# Patient Record
Sex: Female | Born: 1935 | Race: Black or African American | Hispanic: No | Marital: Married | Smoking: Never smoker
Health system: Southern US, Community
[De-identification: ages and names within clinical notes are randomized; demographics above are authoritative.]

## PROBLEM LIST (undated history)

## (undated) DIAGNOSIS — N183 Chronic kidney disease, stage 3 unspecified: Secondary | ICD-10-CM

## (undated) DIAGNOSIS — M47812 Spondylosis without myelopathy or radiculopathy, cervical region: Secondary | ICD-10-CM

## (undated) DIAGNOSIS — Z8619 Personal history of other infectious and parasitic diseases: Secondary | ICD-10-CM

## (undated) DIAGNOSIS — I739 Peripheral vascular disease, unspecified: Secondary | ICD-10-CM

## (undated) DIAGNOSIS — I2510 Atherosclerotic heart disease of native coronary artery without angina pectoris: Secondary | ICD-10-CM

## (undated) DIAGNOSIS — K219 Gastro-esophageal reflux disease without esophagitis: Secondary | ICD-10-CM

## (undated) DIAGNOSIS — Z9119 Patient's noncompliance with other medical treatment and regimen: Secondary | ICD-10-CM

## (undated) DIAGNOSIS — I10 Essential (primary) hypertension: Secondary | ICD-10-CM

## (undated) DIAGNOSIS — J449 Chronic obstructive pulmonary disease, unspecified: Secondary | ICD-10-CM

## (undated) DIAGNOSIS — E669 Obesity, unspecified: Secondary | ICD-10-CM

## (undated) DIAGNOSIS — I429 Cardiomyopathy, unspecified: Secondary | ICD-10-CM

## (undated) DIAGNOSIS — IMO0001 Reserved for inherently not codable concepts without codable children: Secondary | ICD-10-CM

## (undated) DIAGNOSIS — F329 Major depressive disorder, single episode, unspecified: Secondary | ICD-10-CM

## (undated) DIAGNOSIS — F32A Depression, unspecified: Secondary | ICD-10-CM

## (undated) DIAGNOSIS — K279 Peptic ulcer, site unspecified, unspecified as acute or chronic, without hemorrhage or perforation: Secondary | ICD-10-CM

## (undated) DIAGNOSIS — Z91199 Patient's noncompliance with other medical treatment and regimen due to unspecified reason: Secondary | ICD-10-CM

## (undated) DIAGNOSIS — E11319 Type 2 diabetes mellitus with unspecified diabetic retinopathy without macular edema: Secondary | ICD-10-CM

## (undated) DIAGNOSIS — K222 Esophageal obstruction: Secondary | ICD-10-CM

## (undated) DIAGNOSIS — M1990 Unspecified osteoarthritis, unspecified site: Secondary | ICD-10-CM

## (undated) DIAGNOSIS — K5790 Diverticulosis of intestine, part unspecified, without perforation or abscess without bleeding: Secondary | ICD-10-CM

## (undated) DIAGNOSIS — L299 Pruritus, unspecified: Secondary | ICD-10-CM

## (undated) DIAGNOSIS — G4730 Sleep apnea, unspecified: Secondary | ICD-10-CM

## (undated) DIAGNOSIS — R001 Bradycardia, unspecified: Secondary | ICD-10-CM

## (undated) DIAGNOSIS — E785 Hyperlipidemia, unspecified: Secondary | ICD-10-CM

## (undated) DIAGNOSIS — K449 Diaphragmatic hernia without obstruction or gangrene: Secondary | ICD-10-CM

## (undated) DIAGNOSIS — C55 Malignant neoplasm of uterus, part unspecified: Secondary | ICD-10-CM

## (undated) DIAGNOSIS — I5042 Chronic combined systolic (congestive) and diastolic (congestive) heart failure: Secondary | ICD-10-CM

## (undated) DIAGNOSIS — B029 Zoster without complications: Secondary | ICD-10-CM

## (undated) HISTORY — DX: Diaphragmatic hernia without obstruction or gangrene: K44.9

## (undated) HISTORY — DX: Personal history of other infectious and parasitic diseases: Z86.19

## (undated) HISTORY — DX: Cardiomyopathy, unspecified: I42.9

## (undated) HISTORY — DX: Spondylosis without myelopathy or radiculopathy, cervical region: M47.812

## (undated) HISTORY — PX: HERNIA REPAIR: SHX51

## (undated) HISTORY — DX: Pruritus, unspecified: L29.9

## (undated) HISTORY — DX: Malignant neoplasm of uterus, part unspecified: C55

## (undated) HISTORY — DX: Depression, unspecified: F32.A

## (undated) HISTORY — DX: Peptic ulcer, site unspecified, unspecified as acute or chronic, without hemorrhage or perforation: K27.9

## (undated) HISTORY — DX: Patient's noncompliance with other medical treatment and regimen: Z91.19

## (undated) HISTORY — PX: SPINE SURGERY: SHX786

## (undated) HISTORY — PX: TUBAL LIGATION: SHX77

## (undated) HISTORY — PX: BRAIN SURGERY: SHX531

## (undated) HISTORY — DX: Peripheral vascular disease, unspecified: I73.9

## (undated) HISTORY — DX: Gastro-esophageal reflux disease without esophagitis: K21.9

## (undated) HISTORY — DX: Chronic obstructive pulmonary disease, unspecified: J44.9

## (undated) HISTORY — DX: Chronic kidney disease, stage 3 (moderate): N18.3

## (undated) HISTORY — PX: CARDIAC CATHETERIZATION: SHX172

## (undated) HISTORY — DX: Unspecified osteoarthritis, unspecified site: M19.90

## (undated) HISTORY — DX: Type 2 diabetes mellitus with unspecified diabetic retinopathy without macular edema: E11.319

## (undated) HISTORY — PX: EYE SURGERY: SHX253

## (undated) HISTORY — DX: Zoster without complications: B02.9

## (undated) HISTORY — DX: Chronic kidney disease, stage 3 unspecified: N18.30

## (undated) HISTORY — DX: Sleep apnea, unspecified: G47.30

## (undated) HISTORY — DX: Esophageal obstruction: K22.2

## (undated) HISTORY — DX: Bradycardia, unspecified: R00.1

## (undated) HISTORY — DX: Obesity, unspecified: E66.9

## (undated) HISTORY — DX: Chronic combined systolic (congestive) and diastolic (congestive) heart failure: I50.42

## (undated) HISTORY — DX: Atherosclerotic heart disease of native coronary artery without angina pectoris: I25.10

## (undated) HISTORY — DX: Major depressive disorder, single episode, unspecified: F32.9

## (undated) HISTORY — DX: Hyperlipidemia, unspecified: E78.5

## (undated) HISTORY — DX: Diverticulosis of intestine, part unspecified, without perforation or abscess without bleeding: K57.90

## (undated) HISTORY — DX: Patient's noncompliance with other medical treatment and regimen due to unspecified reason: Z91.199

---

## 1965-06-17 HISTORY — PX: TUBAL LIGATION: SHX77

## 1995-06-18 HISTORY — PX: CRANIOTOMY: SHX93

## 1995-10-03 ENCOUNTER — Encounter: Payer: Self-pay | Admitting: Gastroenterology

## 1995-11-13 ENCOUNTER — Encounter: Payer: Self-pay | Admitting: Gastroenterology

## 1997-10-17 ENCOUNTER — Other Ambulatory Visit: Admission: RE | Admit: 1997-10-17 | Discharge: 1997-10-17 | Payer: Self-pay | Admitting: Obstetrics & Gynecology

## 1997-10-30 ENCOUNTER — Emergency Department (HOSPITAL_COMMUNITY): Admission: EM | Admit: 1997-10-30 | Discharge: 1997-10-30 | Payer: Self-pay | Admitting: Emergency Medicine

## 1997-11-07 ENCOUNTER — Ambulatory Visit (HOSPITAL_COMMUNITY): Admission: RE | Admit: 1997-11-07 | Discharge: 1997-11-07 | Payer: Self-pay | Admitting: Neurosurgery

## 1998-04-14 ENCOUNTER — Encounter: Payer: Self-pay | Admitting: Gastroenterology

## 1998-06-14 ENCOUNTER — Encounter: Payer: Self-pay | Admitting: Endocrinology

## 1998-06-14 ENCOUNTER — Ambulatory Visit (HOSPITAL_COMMUNITY): Admission: RE | Admit: 1998-06-14 | Discharge: 1998-06-14 | Payer: Self-pay | Admitting: Endocrinology

## 1998-06-27 ENCOUNTER — Ambulatory Visit (HOSPITAL_COMMUNITY): Admission: RE | Admit: 1998-06-27 | Discharge: 1998-06-27 | Payer: Self-pay | Admitting: Obstetrics & Gynecology

## 1998-06-27 ENCOUNTER — Encounter: Payer: Self-pay | Admitting: Obstetrics & Gynecology

## 1998-07-28 ENCOUNTER — Encounter: Payer: Self-pay | Admitting: Endocrinology

## 1998-07-28 ENCOUNTER — Ambulatory Visit (HOSPITAL_COMMUNITY): Admission: RE | Admit: 1998-07-28 | Discharge: 1998-07-28 | Payer: Self-pay | Admitting: Endocrinology

## 1998-10-16 HISTORY — PX: KNEE ARTHROSCOPY: SUR90

## 1998-11-01 ENCOUNTER — Ambulatory Visit (HOSPITAL_BASED_OUTPATIENT_CLINIC_OR_DEPARTMENT_OTHER): Admission: RE | Admit: 1998-11-01 | Discharge: 1998-11-01 | Payer: Self-pay | Admitting: Orthopedic Surgery

## 1998-12-08 ENCOUNTER — Encounter: Admission: RE | Admit: 1998-12-08 | Discharge: 1998-12-27 | Payer: Self-pay | Admitting: Orthopedic Surgery

## 1999-01-18 ENCOUNTER — Other Ambulatory Visit: Admission: RE | Admit: 1999-01-18 | Discharge: 1999-01-18 | Payer: Self-pay | Admitting: Obstetrics & Gynecology

## 1999-03-09 ENCOUNTER — Ambulatory Visit (HOSPITAL_COMMUNITY): Admission: RE | Admit: 1999-03-09 | Discharge: 1999-03-09 | Payer: Self-pay | Admitting: Orthopedic Surgery

## 1999-04-30 ENCOUNTER — Inpatient Hospital Stay (HOSPITAL_COMMUNITY): Admission: EM | Admit: 1999-04-30 | Discharge: 1999-05-02 | Payer: Self-pay | Admitting: Emergency Medicine

## 1999-04-30 ENCOUNTER — Encounter: Payer: Self-pay | Admitting: Emergency Medicine

## 1999-05-01 ENCOUNTER — Encounter: Payer: Self-pay | Admitting: Cardiology

## 1999-07-05 ENCOUNTER — Ambulatory Visit (HOSPITAL_COMMUNITY): Admission: RE | Admit: 1999-07-05 | Discharge: 1999-07-05 | Payer: Self-pay | Admitting: Orthopedic Surgery

## 1999-07-05 ENCOUNTER — Encounter: Payer: Self-pay | Admitting: Orthopedic Surgery

## 2000-02-15 ENCOUNTER — Encounter: Payer: Self-pay | Admitting: Internal Medicine

## 2000-02-15 ENCOUNTER — Emergency Department (HOSPITAL_COMMUNITY): Admission: EM | Admit: 2000-02-15 | Discharge: 2000-02-15 | Payer: Self-pay | Admitting: Emergency Medicine

## 2000-02-15 ENCOUNTER — Encounter: Payer: Self-pay | Admitting: Emergency Medicine

## 2000-05-12 ENCOUNTER — Encounter: Payer: Self-pay | Admitting: *Deleted

## 2000-05-12 ENCOUNTER — Inpatient Hospital Stay (HOSPITAL_COMMUNITY): Admission: EM | Admit: 2000-05-12 | Discharge: 2000-05-14 | Payer: Self-pay | Admitting: *Deleted

## 2000-05-13 ENCOUNTER — Encounter: Payer: Self-pay | Admitting: Cardiovascular Disease

## 2000-05-13 ENCOUNTER — Encounter: Payer: Self-pay | Admitting: Endocrinology

## 2000-06-06 ENCOUNTER — Encounter: Payer: Self-pay | Admitting: Emergency Medicine

## 2000-06-06 ENCOUNTER — Inpatient Hospital Stay (HOSPITAL_COMMUNITY): Admission: EM | Admit: 2000-06-06 | Discharge: 2000-06-10 | Payer: Self-pay | Admitting: *Deleted

## 2000-06-08 ENCOUNTER — Encounter: Payer: Self-pay | Admitting: Cardiology

## 2000-06-28 ENCOUNTER — Ambulatory Visit (HOSPITAL_BASED_OUTPATIENT_CLINIC_OR_DEPARTMENT_OTHER): Admission: RE | Admit: 2000-06-28 | Discharge: 2000-06-28 | Payer: Self-pay | Admitting: Pulmonary Disease

## 2000-06-28 ENCOUNTER — Encounter: Payer: Self-pay | Admitting: Pulmonary Disease

## 2000-07-08 ENCOUNTER — Encounter: Payer: Self-pay | Admitting: Obstetrics & Gynecology

## 2000-07-08 ENCOUNTER — Ambulatory Visit (HOSPITAL_COMMUNITY): Admission: RE | Admit: 2000-07-08 | Discharge: 2000-07-08 | Payer: Self-pay | Admitting: Obstetrics & Gynecology

## 2000-07-30 ENCOUNTER — Encounter: Payer: Self-pay | Admitting: Internal Medicine

## 2000-07-30 ENCOUNTER — Ambulatory Visit (HOSPITAL_COMMUNITY): Admission: RE | Admit: 2000-07-30 | Discharge: 2000-07-30 | Payer: Self-pay | Admitting: Internal Medicine

## 2000-09-18 ENCOUNTER — Ambulatory Visit (HOSPITAL_BASED_OUTPATIENT_CLINIC_OR_DEPARTMENT_OTHER): Admission: RE | Admit: 2000-09-18 | Discharge: 2000-09-18 | Payer: Self-pay | Admitting: Pulmonary Disease

## 2000-10-07 ENCOUNTER — Encounter: Admission: RE | Admit: 2000-10-07 | Discharge: 2000-10-07 | Payer: Self-pay | Admitting: Neurosurgery

## 2000-10-07 ENCOUNTER — Encounter: Payer: Self-pay | Admitting: Neurosurgery

## 2001-07-17 ENCOUNTER — Encounter: Payer: Self-pay | Admitting: Obstetrics & Gynecology

## 2001-07-17 ENCOUNTER — Ambulatory Visit (HOSPITAL_COMMUNITY): Admission: RE | Admit: 2001-07-17 | Discharge: 2001-07-17 | Payer: Self-pay | Admitting: Obstetrics & Gynecology

## 2001-09-18 ENCOUNTER — Encounter: Admission: RE | Admit: 2001-09-18 | Discharge: 2001-10-07 | Payer: Self-pay | Admitting: Orthopedic Surgery

## 2001-12-22 ENCOUNTER — Other Ambulatory Visit: Admission: RE | Admit: 2001-12-22 | Discharge: 2001-12-22 | Payer: Self-pay | Admitting: Obstetrics & Gynecology

## 2002-07-20 ENCOUNTER — Inpatient Hospital Stay (HOSPITAL_COMMUNITY): Admission: EM | Admit: 2002-07-20 | Discharge: 2002-07-24 | Payer: Self-pay | Admitting: Emergency Medicine

## 2002-07-20 ENCOUNTER — Encounter: Payer: Self-pay | Admitting: Internal Medicine

## 2002-07-21 ENCOUNTER — Encounter: Payer: Self-pay | Admitting: Family Medicine

## 2002-07-21 ENCOUNTER — Encounter: Payer: Self-pay | Admitting: Orthopedic Surgery

## 2003-01-18 ENCOUNTER — Encounter: Payer: Self-pay | Admitting: Family Medicine

## 2003-01-18 ENCOUNTER — Ambulatory Visit (HOSPITAL_COMMUNITY): Admission: RE | Admit: 2003-01-18 | Discharge: 2003-01-18 | Payer: Self-pay | Admitting: Family Medicine

## 2003-06-18 HISTORY — PX: CATARACT EXTRACTION, BILATERAL: SHX1313

## 2003-09-02 ENCOUNTER — Encounter: Admission: RE | Admit: 2003-09-02 | Discharge: 2003-10-19 | Payer: Self-pay | Admitting: Orthopedic Surgery

## 2003-11-17 ENCOUNTER — Ambulatory Visit (HOSPITAL_COMMUNITY): Admission: RE | Admit: 2003-11-17 | Discharge: 2003-11-17 | Payer: Self-pay | Admitting: Endocrinology

## 2003-12-16 HISTORY — PX: OTHER SURGICAL HISTORY: SHX169

## 2004-01-30 ENCOUNTER — Ambulatory Visit (HOSPITAL_COMMUNITY): Admission: RE | Admit: 2004-01-30 | Discharge: 2004-01-30 | Payer: Self-pay | Admitting: Endocrinology

## 2004-03-19 ENCOUNTER — Encounter: Payer: Self-pay | Admitting: Gastroenterology

## 2004-04-04 ENCOUNTER — Encounter: Payer: Self-pay | Admitting: Gastroenterology

## 2004-04-04 HISTORY — PX: ESOPHAGOGASTRODUODENOSCOPY: SHX1529

## 2004-07-10 ENCOUNTER — Ambulatory Visit: Payer: Self-pay | Admitting: Internal Medicine

## 2004-07-12 ENCOUNTER — Ambulatory Visit (HOSPITAL_COMMUNITY): Admission: RE | Admit: 2004-07-12 | Discharge: 2004-07-12 | Payer: Self-pay | Admitting: Internal Medicine

## 2004-07-16 ENCOUNTER — Ambulatory Visit: Payer: Self-pay | Admitting: Endocrinology

## 2004-10-08 ENCOUNTER — Ambulatory Visit: Payer: Self-pay | Admitting: Endocrinology

## 2004-10-31 ENCOUNTER — Ambulatory Visit: Payer: Self-pay | Admitting: Endocrinology

## 2004-11-08 ENCOUNTER — Ambulatory Visit: Payer: Self-pay | Admitting: Endocrinology

## 2004-11-21 ENCOUNTER — Ambulatory Visit: Payer: Self-pay | Admitting: Endocrinology

## 2004-11-28 ENCOUNTER — Ambulatory Visit: Payer: Self-pay | Admitting: Endocrinology

## 2004-12-11 ENCOUNTER — Ambulatory Visit: Payer: Self-pay

## 2004-12-12 ENCOUNTER — Ambulatory Visit: Payer: Self-pay

## 2004-12-12 ENCOUNTER — Other Ambulatory Visit: Admission: RE | Admit: 2004-12-12 | Discharge: 2004-12-12 | Payer: Self-pay | Admitting: Obstetrics & Gynecology

## 2004-12-25 ENCOUNTER — Ambulatory Visit: Payer: Self-pay | Admitting: Endocrinology

## 2005-01-02 ENCOUNTER — Ambulatory Visit: Payer: Self-pay

## 2005-01-28 ENCOUNTER — Ambulatory Visit: Payer: Self-pay | Admitting: Endocrinology

## 2005-01-29 ENCOUNTER — Ambulatory Visit: Payer: Self-pay | Admitting: Pulmonary Disease

## 2005-02-28 ENCOUNTER — Ambulatory Visit: Payer: Self-pay | Admitting: Endocrinology

## 2005-04-01 ENCOUNTER — Ambulatory Visit: Payer: Self-pay

## 2005-04-09 ENCOUNTER — Ambulatory Visit (HOSPITAL_COMMUNITY): Admission: RE | Admit: 2005-04-09 | Discharge: 2005-04-09 | Payer: Self-pay | Admitting: Endocrinology

## 2005-06-21 ENCOUNTER — Ambulatory Visit: Payer: Self-pay

## 2005-06-21 ENCOUNTER — Ambulatory Visit: Payer: Self-pay | Admitting: Endocrinology

## 2005-07-01 ENCOUNTER — Ambulatory Visit: Payer: Self-pay | Admitting: Endocrinology

## 2005-07-05 ENCOUNTER — Ambulatory Visit: Payer: Self-pay | Admitting: Endocrinology

## 2005-07-06 ENCOUNTER — Ambulatory Visit (HOSPITAL_COMMUNITY): Admission: RE | Admit: 2005-07-06 | Discharge: 2005-07-06 | Payer: Self-pay | Admitting: Gastroenterology

## 2005-11-20 ENCOUNTER — Ambulatory Visit: Payer: Self-pay | Admitting: Endocrinology

## 2005-12-03 ENCOUNTER — Ambulatory Visit: Payer: Self-pay | Admitting: Endocrinology

## 2005-12-07 ENCOUNTER — Ambulatory Visit: Payer: Self-pay | Admitting: Cardiology

## 2005-12-07 ENCOUNTER — Inpatient Hospital Stay (HOSPITAL_COMMUNITY): Admission: EM | Admit: 2005-12-07 | Discharge: 2005-12-10 | Payer: Self-pay | Admitting: Emergency Medicine

## 2005-12-09 ENCOUNTER — Ambulatory Visit: Payer: Self-pay | Admitting: Internal Medicine

## 2005-12-09 ENCOUNTER — Encounter (INDEPENDENT_AMBULATORY_CARE_PROVIDER_SITE_OTHER): Payer: Self-pay | Admitting: *Deleted

## 2005-12-26 ENCOUNTER — Ambulatory Visit: Payer: Self-pay | Admitting: Endocrinology

## 2005-12-30 ENCOUNTER — Ambulatory Visit: Payer: Self-pay | Admitting: Endocrinology

## 2006-02-11 ENCOUNTER — Ambulatory Visit: Payer: Self-pay | Admitting: Cardiology

## 2006-03-27 ENCOUNTER — Ambulatory Visit: Payer: Self-pay | Admitting: Endocrinology

## 2006-04-17 ENCOUNTER — Ambulatory Visit: Payer: Self-pay | Admitting: Endocrinology

## 2006-04-22 ENCOUNTER — Ambulatory Visit: Payer: Self-pay | Admitting: Endocrinology

## 2006-04-24 ENCOUNTER — Ambulatory Visit: Payer: Self-pay | Admitting: Endocrinology

## 2006-05-06 ENCOUNTER — Ambulatory Visit (HOSPITAL_COMMUNITY): Admission: RE | Admit: 2006-05-06 | Discharge: 2006-05-06 | Payer: Self-pay | Admitting: Obstetrics & Gynecology

## 2006-05-19 ENCOUNTER — Ambulatory Visit: Payer: Self-pay | Admitting: Endocrinology

## 2006-05-23 ENCOUNTER — Ambulatory Visit: Payer: Self-pay | Admitting: Cardiology

## 2006-06-11 ENCOUNTER — Inpatient Hospital Stay (HOSPITAL_COMMUNITY): Admission: AD | Admit: 2006-06-11 | Discharge: 2006-06-17 | Payer: Self-pay | Admitting: Endocrinology

## 2006-06-11 ENCOUNTER — Ambulatory Visit: Payer: Self-pay | Admitting: Endocrinology

## 2006-06-11 ENCOUNTER — Ambulatory Visit: Payer: Self-pay | Admitting: Cardiology

## 2006-06-12 ENCOUNTER — Ambulatory Visit: Payer: Self-pay | Admitting: Internal Medicine

## 2006-06-23 ENCOUNTER — Ambulatory Visit: Payer: Self-pay | Admitting: Cardiology

## 2006-06-24 ENCOUNTER — Ambulatory Visit: Payer: Self-pay | Admitting: Endocrinology

## 2006-07-21 ENCOUNTER — Ambulatory Visit: Payer: Self-pay | Admitting: Cardiology

## 2006-07-29 ENCOUNTER — Ambulatory Visit: Payer: Self-pay | Admitting: Endocrinology

## 2006-08-04 ENCOUNTER — Ambulatory Visit: Payer: Self-pay | Admitting: Cardiology

## 2006-08-04 LAB — CONVERTED CEMR LAB
CO2: 28 meq/L (ref 19–32)
GFR calc Af Amer: 51 mL/min
GFR calc non Af Amer: 43 mL/min
Glucose, Bld: 141 mg/dL — ABNORMAL HIGH (ref 70–99)
Sodium: 142 meq/L (ref 135–145)

## 2006-08-19 ENCOUNTER — Ambulatory Visit: Payer: Self-pay | Admitting: Cardiology

## 2006-09-01 ENCOUNTER — Ambulatory Visit: Payer: Self-pay | Admitting: *Deleted

## 2006-09-03 ENCOUNTER — Inpatient Hospital Stay (HOSPITAL_COMMUNITY): Admission: EM | Admit: 2006-09-03 | Discharge: 2006-09-04 | Payer: Self-pay | Admitting: Emergency Medicine

## 2006-09-18 ENCOUNTER — Ambulatory Visit: Payer: Self-pay | Admitting: Endocrinology

## 2006-09-19 ENCOUNTER — Ambulatory Visit: Payer: Self-pay | Admitting: Cardiology

## 2006-09-19 ENCOUNTER — Ambulatory Visit: Payer: Self-pay | Admitting: Internal Medicine

## 2006-09-19 LAB — CONVERTED CEMR LAB
CO2: 29 meq/L (ref 19–32)
Chloride: 104 meq/L (ref 96–112)
GFR calc Af Amer: 38 mL/min
GFR calc non Af Amer: 31 mL/min
Glucose, Bld: 250 mg/dL — ABNORMAL HIGH (ref 70–99)
Sodium: 141 meq/L (ref 135–145)

## 2006-09-22 ENCOUNTER — Encounter: Admission: RE | Admit: 2006-09-22 | Discharge: 2006-12-21 | Payer: Self-pay | Admitting: Cardiology

## 2006-10-03 ENCOUNTER — Ambulatory Visit: Payer: Self-pay | Admitting: Endocrinology

## 2006-11-29 ENCOUNTER — Encounter: Admission: RE | Admit: 2006-11-29 | Discharge: 2006-11-29 | Payer: Self-pay | Admitting: Neurosurgery

## 2006-12-08 ENCOUNTER — Ambulatory Visit: Payer: Self-pay | Admitting: Endocrinology

## 2006-12-08 LAB — CONVERTED CEMR LAB
BUN: 19 mg/dL (ref 6–23)
Basophils Relative: 0.6 % (ref 0.0–1.0)
CO2: 31 meq/L (ref 19–32)
Calcium: 9.4 mg/dL (ref 8.4–10.5)
Chloride: 112 meq/L (ref 96–112)
Creatinine, Ser: 1.2 mg/dL (ref 0.4–1.2)
Crystals: NEGATIVE
Eosinophils Absolute: 0.1 10*3/uL (ref 0.0–0.6)
Eosinophils Relative: 1.7 % (ref 0.0–5.0)
GFR calc Af Amer: 56 mL/min
Glucose, Bld: 151 mg/dL — ABNORMAL HIGH (ref 70–99)
HCT: 41.5 % (ref 36.0–46.0)
Hgb A1c MFr Bld: 10.5 % — ABNORMAL HIGH (ref 4.6–6.0)
Ketones, ur: NEGATIVE mg/dL
MCV: 86.9 fL (ref 78.0–100.0)
Microalb Creat Ratio: 197.7 mg/g — ABNORMAL HIGH (ref 0.0–30.0)
Microalb, Ur: 31 mg/dL (ref 0.0–1.9)
Mucus, UA: NEGATIVE
Neutrophils Relative %: 63.9 % (ref 43.0–77.0)
Platelets: 276 10*3/uL (ref 150–400)
RBC: 4.77 M/uL (ref 3.87–5.11)
Total CHOL/HDL Ratio: 2.5
Total Protein, Urine: 100 mg/dL — AB
Triglycerides: 74 mg/dL (ref 0–149)
VLDL: 15 mg/dL (ref 0–40)
WBC: 6.9 10*3/uL (ref 4.5–10.5)

## 2006-12-23 ENCOUNTER — Ambulatory Visit: Payer: Self-pay | Admitting: Cardiology

## 2006-12-23 ENCOUNTER — Inpatient Hospital Stay (HOSPITAL_COMMUNITY): Admission: EM | Admit: 2006-12-23 | Discharge: 2006-12-24 | Payer: Self-pay | Admitting: Emergency Medicine

## 2007-01-02 ENCOUNTER — Ambulatory Visit: Payer: Self-pay | Admitting: Cardiology

## 2007-01-22 ENCOUNTER — Encounter: Payer: Self-pay | Admitting: Endocrinology

## 2007-01-22 DIAGNOSIS — I2583 Coronary atherosclerosis due to lipid rich plaque: Secondary | ICD-10-CM

## 2007-01-22 DIAGNOSIS — K219 Gastro-esophageal reflux disease without esophagitis: Secondary | ICD-10-CM | POA: Insufficient documentation

## 2007-01-22 DIAGNOSIS — I2510 Atherosclerotic heart disease of native coronary artery without angina pectoris: Secondary | ICD-10-CM | POA: Insufficient documentation

## 2007-01-22 DIAGNOSIS — I739 Peripheral vascular disease, unspecified: Secondary | ICD-10-CM | POA: Insufficient documentation

## 2007-01-22 DIAGNOSIS — I10 Essential (primary) hypertension: Secondary | ICD-10-CM | POA: Insufficient documentation

## 2007-01-22 DIAGNOSIS — M1990 Unspecified osteoarthritis, unspecified site: Secondary | ICD-10-CM | POA: Insufficient documentation

## 2007-01-23 ENCOUNTER — Ambulatory Visit: Payer: Self-pay | Admitting: Endocrinology

## 2007-03-24 ENCOUNTER — Ambulatory Visit: Payer: Self-pay | Admitting: Endocrinology

## 2007-03-24 LAB — CONVERTED CEMR LAB
ALT: 30 units/L (ref 0–35)
AST: 22 units/L (ref 0–37)
Alkaline Phosphatase: 73 units/L (ref 39–117)
Cholesterol: 185 mg/dL (ref 0–200)
LDL Cholesterol: 91 mg/dL (ref 0–99)
Total Protein: 6.3 g/dL (ref 6.0–8.3)
VLDL: 26 mg/dL (ref 0–40)

## 2007-03-27 ENCOUNTER — Ambulatory Visit: Payer: Self-pay | Admitting: Cardiology

## 2007-05-28 ENCOUNTER — Ambulatory Visit (HOSPITAL_COMMUNITY): Admission: RE | Admit: 2007-05-28 | Discharge: 2007-05-28 | Payer: Self-pay | Admitting: Endocrinology

## 2007-05-31 ENCOUNTER — Encounter: Payer: Self-pay | Admitting: Endocrinology

## 2007-06-16 ENCOUNTER — Telehealth: Payer: Self-pay | Admitting: Endocrinology

## 2007-07-16 ENCOUNTER — Ambulatory Visit: Payer: Self-pay | Admitting: Internal Medicine

## 2007-07-16 DIAGNOSIS — J449 Chronic obstructive pulmonary disease, unspecified: Secondary | ICD-10-CM | POA: Insufficient documentation

## 2007-07-16 DIAGNOSIS — J189 Pneumonia, unspecified organism: Secondary | ICD-10-CM | POA: Insufficient documentation

## 2007-07-16 DIAGNOSIS — F329 Major depressive disorder, single episode, unspecified: Secondary | ICD-10-CM | POA: Insufficient documentation

## 2007-07-16 DIAGNOSIS — E785 Hyperlipidemia, unspecified: Secondary | ICD-10-CM | POA: Insufficient documentation

## 2007-07-16 DIAGNOSIS — G609 Hereditary and idiopathic neuropathy, unspecified: Secondary | ICD-10-CM | POA: Insufficient documentation

## 2007-07-16 DIAGNOSIS — K279 Peptic ulcer, site unspecified, unspecified as acute or chronic, without hemorrhage or perforation: Secondary | ICD-10-CM | POA: Insufficient documentation

## 2007-07-16 DIAGNOSIS — J45909 Unspecified asthma, uncomplicated: Secondary | ICD-10-CM | POA: Insufficient documentation

## 2007-07-16 DIAGNOSIS — J4489 Other specified chronic obstructive pulmonary disease: Secondary | ICD-10-CM | POA: Insufficient documentation

## 2007-09-03 ENCOUNTER — Ambulatory Visit: Payer: Self-pay | Admitting: Endocrinology

## 2007-09-03 DIAGNOSIS — R0602 Shortness of breath: Secondary | ICD-10-CM | POA: Insufficient documentation

## 2007-09-03 LAB — CONVERTED CEMR LAB
Glucose, Urine, Semiquant: NEGATIVE
Hgb A1c MFr Bld: 10.7 % — ABNORMAL HIGH (ref 4.6–6.0)
Ketones, urine, test strip: NEGATIVE
Nitrite: NEGATIVE
Protein, U semiquant: NEGATIVE

## 2007-09-04 ENCOUNTER — Ambulatory Visit (HOSPITAL_COMMUNITY): Admission: RE | Admit: 2007-09-04 | Discharge: 2007-09-04 | Payer: Self-pay | Admitting: Endocrinology

## 2007-09-04 ENCOUNTER — Telehealth (INDEPENDENT_AMBULATORY_CARE_PROVIDER_SITE_OTHER): Payer: Self-pay | Admitting: *Deleted

## 2007-09-17 ENCOUNTER — Ambulatory Visit: Payer: Self-pay | Admitting: Cardiology

## 2007-09-18 ENCOUNTER — Ambulatory Visit: Payer: Self-pay | Admitting: Endocrinology

## 2007-10-05 ENCOUNTER — Encounter: Payer: Self-pay | Admitting: Endocrinology

## 2007-12-04 ENCOUNTER — Ambulatory Visit: Payer: Self-pay | Admitting: Endocrinology

## 2007-12-04 LAB — CONVERTED CEMR LAB
CO2: 30 meq/L (ref 19–32)
Chloride: 102 meq/L (ref 96–112)
Hgb A1c MFr Bld: 10.1 % — ABNORMAL HIGH (ref 4.6–6.0)
Pro B Natriuretic peptide (BNP): 221 pg/mL — ABNORMAL HIGH (ref 0.0–100.0)
Sodium: 141 meq/L (ref 135–145)

## 2007-12-21 ENCOUNTER — Ambulatory Visit: Payer: Self-pay | Admitting: Endocrinology

## 2007-12-21 DIAGNOSIS — R609 Edema, unspecified: Secondary | ICD-10-CM | POA: Insufficient documentation

## 2007-12-21 LAB — CONVERTED CEMR LAB
Chloride: 104 meq/L (ref 96–112)
GFR calc non Af Amer: 42 mL/min
Potassium: 3.7 meq/L (ref 3.5–5.1)
Pro B Natriuretic peptide (BNP): 68 pg/mL (ref 0.0–100.0)
Sodium: 141 meq/L (ref 135–145)

## 2008-01-21 ENCOUNTER — Ambulatory Visit: Payer: Self-pay | Admitting: Endocrinology

## 2008-02-09 ENCOUNTER — Ambulatory Visit: Payer: Self-pay | Admitting: Endocrinology

## 2008-02-09 ENCOUNTER — Ambulatory Visit: Payer: Self-pay

## 2008-02-16 ENCOUNTER — Ambulatory Visit: Payer: Self-pay | Admitting: Endocrinology

## 2008-02-16 DIAGNOSIS — M79609 Pain in unspecified limb: Secondary | ICD-10-CM | POA: Insufficient documentation

## 2008-03-02 ENCOUNTER — Telehealth (INDEPENDENT_AMBULATORY_CARE_PROVIDER_SITE_OTHER): Payer: Self-pay | Admitting: *Deleted

## 2008-03-21 ENCOUNTER — Ambulatory Visit: Payer: Self-pay | Admitting: Cardiology

## 2008-03-24 ENCOUNTER — Ambulatory Visit: Payer: Self-pay | Admitting: Endocrinology

## 2008-03-24 LAB — CONVERTED CEMR LAB
Chloride: 103 meq/L (ref 96–112)
Creatinine, Ser: 1.4 mg/dL — ABNORMAL HIGH (ref 0.4–1.2)
GFR calc non Af Amer: 39 mL/min
Hgb A1c MFr Bld: 9.3 % — ABNORMAL HIGH (ref 4.6–6.0)
Potassium: 4 meq/L (ref 3.5–5.1)
Sodium: 142 meq/L (ref 135–145)

## 2008-04-29 ENCOUNTER — Encounter: Payer: Self-pay | Admitting: Endocrinology

## 2008-05-03 ENCOUNTER — Encounter: Payer: Self-pay | Admitting: Endocrinology

## 2008-05-26 ENCOUNTER — Telehealth: Payer: Self-pay | Admitting: Endocrinology

## 2008-05-26 ENCOUNTER — Ambulatory Visit: Payer: Self-pay | Admitting: Internal Medicine

## 2008-05-26 ENCOUNTER — Ambulatory Visit: Payer: Self-pay | Admitting: Endocrinology

## 2008-05-31 ENCOUNTER — Ambulatory Visit (HOSPITAL_COMMUNITY): Admission: RE | Admit: 2008-05-31 | Discharge: 2008-05-31 | Payer: Self-pay | Admitting: Endocrinology

## 2008-06-17 DIAGNOSIS — M810 Age-related osteoporosis without current pathological fracture: Secondary | ICD-10-CM | POA: Insufficient documentation

## 2008-06-17 HISTORY — PX: CHOLECYSTECTOMY: SHX55

## 2008-06-23 ENCOUNTER — Ambulatory Visit: Payer: Self-pay | Admitting: Endocrinology

## 2008-06-29 ENCOUNTER — Ambulatory Visit: Payer: Self-pay | Admitting: Internal Medicine

## 2008-06-29 ENCOUNTER — Ambulatory Visit: Payer: Self-pay | Admitting: Cardiovascular Disease

## 2008-06-29 ENCOUNTER — Inpatient Hospital Stay (HOSPITAL_COMMUNITY): Admission: AD | Admit: 2008-06-29 | Discharge: 2008-07-07 | Payer: Self-pay | Admitting: Internal Medicine

## 2008-06-29 DIAGNOSIS — R1011 Right upper quadrant pain: Secondary | ICD-10-CM | POA: Insufficient documentation

## 2008-06-29 DIAGNOSIS — R509 Fever, unspecified: Secondary | ICD-10-CM | POA: Insufficient documentation

## 2008-07-01 ENCOUNTER — Encounter: Payer: Self-pay | Admitting: Cardiovascular Disease

## 2008-07-04 ENCOUNTER — Other Ambulatory Visit: Payer: Self-pay | Admitting: Internal Medicine

## 2008-07-11 ENCOUNTER — Ambulatory Visit: Payer: Self-pay | Admitting: Endocrinology

## 2008-07-11 LAB — CONVERTED CEMR LAB
Chloride: 105 meq/L (ref 96–112)
Creatinine, Ser: 1.7 mg/dL — ABNORMAL HIGH (ref 0.4–1.2)
GFR calc non Af Amer: 31 mL/min
Potassium: 4.3 meq/L (ref 3.5–5.1)

## 2008-07-19 ENCOUNTER — Ambulatory Visit: Payer: Self-pay | Admitting: Endocrinology

## 2008-07-19 DIAGNOSIS — N186 End stage renal disease: Secondary | ICD-10-CM | POA: Insufficient documentation

## 2008-07-19 DIAGNOSIS — Z992 Dependence on renal dialysis: Secondary | ICD-10-CM

## 2008-07-28 ENCOUNTER — Ambulatory Visit: Payer: Self-pay | Admitting: Endocrinology

## 2008-07-28 LAB — CONVERTED CEMR LAB
ALT: 19 units/L (ref 0–35)
Alkaline Phosphatase: 69 units/L (ref 39–117)
Bilirubin, Direct: 0.1 mg/dL (ref 0.0–0.3)
CO2: 27 meq/L (ref 19–32)
Calcium, Total (PTH): 9.5 mg/dL (ref 8.4–10.5)
Eosinophils Relative: 2.7 % (ref 0.0–5.0)
Glucose, Bld: 301 mg/dL — ABNORMAL HIGH (ref 70–99)
HCT: 41.5 % (ref 36.0–46.0)
Microalb Creat Ratio: 88.1 mg/g — ABNORMAL HIGH (ref 0.0–30.0)
Monocytes Absolute: 0.5 10*3/uL (ref 0.1–1.0)
Monocytes Relative: 8.4 % (ref 3.0–12.0)
PTH: 96 pg/mL — ABNORMAL HIGH (ref 14.0–72.0)
Platelets: 236 10*3/uL (ref 150–400)
Potassium: 4.2 meq/L (ref 3.5–5.1)
Sodium: 138 meq/L (ref 135–145)
Specific Gravity, Urine: >=1.03 (ref 1.000–1.03)
TSH: 1.87 microintl units/mL (ref 0.35–5.50)
Total Protein, Urine: 100 mg/dL — AB
Total Protein: 6.4 g/dL (ref 6.0–8.3)
Urine Glucose: 500 mg/dL — AB
WBC: 5.6 10*3/uL (ref 4.5–10.5)

## 2008-08-25 ENCOUNTER — Encounter: Payer: Self-pay | Admitting: Endocrinology

## 2008-09-01 ENCOUNTER — Encounter: Payer: Self-pay | Admitting: Endocrinology

## 2008-09-01 ENCOUNTER — Ambulatory Visit: Payer: Self-pay | Admitting: Endocrinology

## 2008-09-01 ENCOUNTER — Ambulatory Visit (HOSPITAL_COMMUNITY): Admission: RE | Admit: 2008-09-01 | Discharge: 2008-09-01 | Payer: Self-pay | Admitting: Surgery

## 2008-09-01 DIAGNOSIS — Z9119 Patient's noncompliance with other medical treatment and regimen: Secondary | ICD-10-CM | POA: Insufficient documentation

## 2008-09-01 DIAGNOSIS — Z91199 Patient's noncompliance with other medical treatment and regimen due to unspecified reason: Secondary | ICD-10-CM | POA: Insufficient documentation

## 2008-09-08 ENCOUNTER — Ambulatory Visit: Payer: Self-pay | Admitting: Endocrinology

## 2008-09-19 ENCOUNTER — Encounter: Payer: Self-pay | Admitting: Internal Medicine

## 2008-09-19 ENCOUNTER — Encounter: Payer: Self-pay | Admitting: Cardiology

## 2008-09-19 ENCOUNTER — Encounter (INDEPENDENT_AMBULATORY_CARE_PROVIDER_SITE_OTHER): Payer: Self-pay | Admitting: Surgery

## 2008-09-20 ENCOUNTER — Inpatient Hospital Stay (HOSPITAL_COMMUNITY): Admission: EM | Admit: 2008-09-20 | Discharge: 2008-09-21 | Payer: Self-pay | Admitting: *Deleted

## 2008-09-20 ENCOUNTER — Ambulatory Visit: Payer: Self-pay | Admitting: Internal Medicine

## 2008-09-21 ENCOUNTER — Telehealth (INDEPENDENT_AMBULATORY_CARE_PROVIDER_SITE_OTHER): Payer: Self-pay | Admitting: *Deleted

## 2008-09-22 ENCOUNTER — Ambulatory Visit: Payer: Self-pay | Admitting: Endocrinology

## 2008-09-22 ENCOUNTER — Telehealth (INDEPENDENT_AMBULATORY_CARE_PROVIDER_SITE_OTHER): Payer: Self-pay | Admitting: *Deleted

## 2008-09-27 ENCOUNTER — Ambulatory Visit: Payer: Self-pay | Admitting: Endocrinology

## 2008-10-12 ENCOUNTER — Ambulatory Visit: Payer: Self-pay | Admitting: Pulmonary Disease

## 2008-10-12 DIAGNOSIS — G4733 Obstructive sleep apnea (adult) (pediatric): Secondary | ICD-10-CM | POA: Insufficient documentation

## 2008-10-13 ENCOUNTER — Ambulatory Visit: Payer: Self-pay | Admitting: Cardiology

## 2008-10-14 ENCOUNTER — Encounter: Payer: Self-pay | Admitting: Cardiology

## 2008-10-27 ENCOUNTER — Telehealth: Payer: Self-pay | Admitting: Endocrinology

## 2008-11-07 ENCOUNTER — Encounter: Payer: Self-pay | Admitting: Endocrinology

## 2008-11-14 ENCOUNTER — Encounter: Payer: Self-pay | Admitting: Cardiology

## 2008-11-14 ENCOUNTER — Ambulatory Visit: Payer: Self-pay | Admitting: *Deleted

## 2008-11-15 ENCOUNTER — Ambulatory Visit: Payer: Self-pay | Admitting: Vascular Surgery

## 2008-11-15 ENCOUNTER — Encounter: Payer: Self-pay | Admitting: Cardiology

## 2008-11-15 ENCOUNTER — Inpatient Hospital Stay (HOSPITAL_COMMUNITY): Admission: EM | Admit: 2008-11-15 | Discharge: 2008-11-16 | Payer: Self-pay | Admitting: Emergency Medicine

## 2008-11-16 ENCOUNTER — Encounter (INDEPENDENT_AMBULATORY_CARE_PROVIDER_SITE_OTHER): Payer: Self-pay | Admitting: *Deleted

## 2008-11-23 ENCOUNTER — Encounter: Payer: Self-pay | Admitting: Endocrinology

## 2008-11-23 ENCOUNTER — Encounter: Payer: Self-pay | Admitting: Physician Assistant

## 2008-11-23 ENCOUNTER — Ambulatory Visit: Payer: Self-pay | Admitting: Cardiology

## 2008-11-23 LAB — CONVERTED CEMR LAB
Chloride: 109 meq/L (ref 96–112)
Creatinine, Ser: 1.2 mg/dL (ref 0.4–1.2)
GFR calc non Af Amer: 55.99 mL/min (ref >60)
Potassium: 3.9 meq/L (ref 3.5–5.1)

## 2008-12-05 ENCOUNTER — Ambulatory Visit: Payer: Self-pay | Admitting: Endocrinology

## 2008-12-21 ENCOUNTER — Encounter: Payer: Self-pay | Admitting: Pulmonary Disease

## 2009-01-12 ENCOUNTER — Ambulatory Visit: Payer: Self-pay | Admitting: Cardiology

## 2009-01-19 ENCOUNTER — Ambulatory Visit: Payer: Self-pay | Admitting: Endocrinology

## 2009-03-01 ENCOUNTER — Encounter (INDEPENDENT_AMBULATORY_CARE_PROVIDER_SITE_OTHER): Payer: Self-pay | Admitting: *Deleted

## 2009-03-23 ENCOUNTER — Encounter: Payer: Self-pay | Admitting: Endocrinology

## 2009-04-05 ENCOUNTER — Encounter (INDEPENDENT_AMBULATORY_CARE_PROVIDER_SITE_OTHER): Payer: Self-pay | Admitting: *Deleted

## 2009-04-05 ENCOUNTER — Ambulatory Visit: Payer: Self-pay | Admitting: Gastroenterology

## 2009-04-05 DIAGNOSIS — R1319 Other dysphagia: Secondary | ICD-10-CM | POA: Insufficient documentation

## 2009-04-05 DIAGNOSIS — R634 Abnormal weight loss: Secondary | ICD-10-CM | POA: Insufficient documentation

## 2009-05-02 ENCOUNTER — Encounter: Payer: Self-pay | Admitting: Cardiology

## 2009-05-08 ENCOUNTER — Ambulatory Visit: Payer: Self-pay | Admitting: Gastroenterology

## 2009-05-16 ENCOUNTER — Encounter: Payer: Self-pay | Admitting: Gastroenterology

## 2009-05-18 ENCOUNTER — Ambulatory Visit: Payer: Self-pay | Admitting: Endocrinology

## 2009-05-18 DIAGNOSIS — R0989 Other specified symptoms and signs involving the circulatory and respiratory systems: Secondary | ICD-10-CM | POA: Insufficient documentation

## 2009-05-22 LAB — CONVERTED CEMR LAB
BUN: 20 mg/dL (ref 6–23)
Basophils Absolute: 0 10*3/uL (ref 0.0–0.1)
Bilirubin Urine: NEGATIVE
Bilirubin, Direct: 0.1 mg/dL (ref 0.0–0.3)
Chloride: 105 meq/L (ref 96–112)
Cholesterol: 188 mg/dL (ref 0–200)
Creatinine, Ser: 1.1 mg/dL (ref 0.4–1.2)
Eosinophils Absolute: 0.1 10*3/uL (ref 0.0–0.7)
Eosinophils Relative: 1.7 % (ref 0.0–5.0)
Glucose, Bld: 157 mg/dL — ABNORMAL HIGH (ref 70–99)
HCT: 45.4 % (ref 36.0–46.0)
Hgb A1c MFr Bld: 9 % — ABNORMAL HIGH (ref 4.6–6.5)
Ketones, ur: NEGATIVE mg/dL
LDL Cholesterol: 103 mg/dL — ABNORMAL HIGH (ref 0–99)
Leukocytes, UA: NEGATIVE
Lymphs Abs: 1.8 10*3/uL (ref 0.7–4.0)
MCHC: 32.8 g/dL (ref 30.0–36.0)
MCV: 88.6 fL (ref 78.0–100.0)
Microalb Creat Ratio: 1361.3 mg/g — ABNORMAL HIGH (ref 0.0–30.0)
Microalb, Ur: 326.3 mg/dL — ABNORMAL HIGH (ref 0.0–1.9)
Monocytes Absolute: 0.5 10*3/uL (ref 0.1–1.0)
Neutrophils Relative %: 67.8 % (ref 43.0–77.0)
Nitrite: NEGATIVE
Platelets: 279 10*3/uL (ref 150.0–400.0)
Pro B Natriuretic peptide (BNP): 526 pg/mL — ABNORMAL HIGH (ref 0.0–100.0)
RDW: 13.9 % (ref 11.5–14.6)
TSH: 1.4 microintl units/mL (ref 0.35–5.50)
Total Bilirubin: 0.9 mg/dL (ref 0.3–1.2)
Triglycerides: 102 mg/dL (ref 0.0–149.0)
WBC: 7.5 10*3/uL (ref 4.5–10.5)
pH: 5 (ref 5.0–8.0)

## 2009-05-23 ENCOUNTER — Encounter: Payer: Self-pay | Admitting: Endocrinology

## 2009-05-24 ENCOUNTER — Encounter: Payer: Self-pay | Admitting: Endocrinology

## 2009-05-24 ENCOUNTER — Ambulatory Visit: Payer: Self-pay

## 2009-05-30 ENCOUNTER — Ambulatory Visit: Payer: Self-pay | Admitting: Endocrinology

## 2009-05-30 DIAGNOSIS — R809 Proteinuria, unspecified: Secondary | ICD-10-CM | POA: Insufficient documentation

## 2009-06-01 ENCOUNTER — Ambulatory Visit (HOSPITAL_COMMUNITY): Admission: RE | Admit: 2009-06-01 | Discharge: 2009-06-01 | Payer: Self-pay | Admitting: Endocrinology

## 2009-06-01 ENCOUNTER — Encounter: Payer: Self-pay | Admitting: Endocrinology

## 2009-06-01 LAB — HM MAMMOGRAPHY: HM Mammogram: NEGATIVE

## 2009-06-06 ENCOUNTER — Ambulatory Visit: Payer: Self-pay | Admitting: Cardiology

## 2009-06-06 DIAGNOSIS — I5042 Chronic combined systolic (congestive) and diastolic (congestive) heart failure: Secondary | ICD-10-CM | POA: Insufficient documentation

## 2009-06-22 ENCOUNTER — Ambulatory Visit: Payer: Self-pay | Admitting: Cardiology

## 2009-06-22 LAB — CONVERTED CEMR LAB
BUN: 18 mg/dL (ref 6–23)
CO2: 27 meq/L (ref 19–32)
Chloride: 107 meq/L (ref 96–112)
Creatinine, Ser: 1.3 mg/dL — ABNORMAL HIGH (ref 0.4–1.2)
Potassium: 4.1 meq/L (ref 3.5–5.1)

## 2009-06-23 LAB — CONVERTED CEMR LAB: Pro B Natriuretic peptide (BNP): 478 pg/mL — ABNORMAL HIGH (ref 0.0–100.0)

## 2009-07-20 ENCOUNTER — Ambulatory Visit: Payer: Self-pay | Admitting: Cardiology

## 2009-07-20 LAB — CONVERTED CEMR LAB
CO2: 29 meq/L (ref 19–32)
Chloride: 104 meq/L (ref 96–112)
Glucose, Bld: 186 mg/dL — ABNORMAL HIGH (ref 70–99)
Sodium: 141 meq/L (ref 135–145)

## 2009-10-12 ENCOUNTER — Ambulatory Visit: Payer: Self-pay | Admitting: Pulmonary Disease

## 2009-10-16 ENCOUNTER — Ambulatory Visit: Payer: Self-pay | Admitting: Endocrinology

## 2009-10-16 ENCOUNTER — Ambulatory Visit: Payer: Self-pay | Admitting: Cardiology

## 2009-10-16 DIAGNOSIS — R109 Unspecified abdominal pain: Secondary | ICD-10-CM | POA: Insufficient documentation

## 2009-10-16 LAB — CONVERTED CEMR LAB
ALT: 15 units/L (ref 0–35)
Alkaline Phosphatase: 71 units/L (ref 39–117)
BUN: 19 mg/dL (ref 6–23)
Basophils Absolute: 0 10*3/uL (ref 0.0–0.1)
Basophils Relative: 0.3 % (ref 0.0–3.0)
Bilirubin Urine: NEGATIVE
Bilirubin, Direct: 0.3 mg/dL (ref 0.0–0.3)
Calcium: 8.7 mg/dL (ref 8.4–10.5)
Creatinine, Ser: 1.3 mg/dL — ABNORMAL HIGH (ref 0.4–1.2)
Eosinophils Absolute: 0.1 10*3/uL (ref 0.0–0.7)
GFR calc non Af Amer: 50.93 mL/min (ref >60)
Hemoglobin: 13.7 g/dL (ref 12.0–15.0)
Ketones, ur: NEGATIVE mg/dL
Lymphs Abs: 1.1 10*3/uL (ref 0.7–4.0)
MCHC: 33.2 g/dL (ref 30.0–36.0)
MCV: 85.4 fL (ref 78.0–100.0)
Monocytes Absolute: 0.5 10*3/uL (ref 0.1–1.0)
Neutro Abs: 4.8 10*3/uL (ref 1.4–7.7)
Pro B Natriuretic peptide (BNP): 535 pg/mL — ABNORMAL HIGH (ref 0.0–100.0)
RBC: 4.85 M/uL (ref 3.87–5.11)
RDW: 15.6 % — ABNORMAL HIGH (ref 11.5–14.6)
Specific Gravity, Urine: >=1.03 (ref 1.000–1.030)
Total Bilirubin: 0.7 mg/dL (ref 0.3–1.2)
Urine Glucose: 250 mg/dL
Urobilinogen, UA: 0.2 (ref 0.0–1.0)

## 2009-10-24 ENCOUNTER — Ambulatory Visit: Payer: Self-pay | Admitting: Endocrinology

## 2009-10-31 ENCOUNTER — Ambulatory Visit: Payer: Self-pay | Admitting: Endocrinology

## 2009-10-31 LAB — CONVERTED CEMR LAB
BUN: 27 mg/dL — ABNORMAL HIGH (ref 6–23)
CO2: 30 meq/L (ref 19–32)
Calcium: 9.4 mg/dL (ref 8.4–10.5)
Glucose, Bld: 303 mg/dL — ABNORMAL HIGH (ref 70–99)
Potassium: 3.8 meq/L (ref 3.5–5.1)
Sodium: 138 meq/L (ref 135–145)

## 2009-11-15 ENCOUNTER — Encounter: Payer: Self-pay | Admitting: Pulmonary Disease

## 2009-11-30 ENCOUNTER — Telehealth: Payer: Self-pay | Admitting: Endocrinology

## 2009-12-08 ENCOUNTER — Ambulatory Visit: Payer: Self-pay | Admitting: Endocrinology

## 2009-12-19 ENCOUNTER — Telehealth: Payer: Self-pay | Admitting: Endocrinology

## 2009-12-19 ENCOUNTER — Ambulatory Visit: Payer: Self-pay | Admitting: Internal Medicine

## 2009-12-19 DIAGNOSIS — J069 Acute upper respiratory infection, unspecified: Secondary | ICD-10-CM | POA: Insufficient documentation

## 2009-12-19 DIAGNOSIS — J45901 Unspecified asthma with (acute) exacerbation: Secondary | ICD-10-CM | POA: Insufficient documentation

## 2010-01-22 ENCOUNTER — Ambulatory Visit: Payer: Self-pay | Admitting: Endocrinology

## 2010-01-22 DIAGNOSIS — E876 Hypokalemia: Secondary | ICD-10-CM | POA: Insufficient documentation

## 2010-01-22 DIAGNOSIS — R252 Cramp and spasm: Secondary | ICD-10-CM | POA: Insufficient documentation

## 2010-01-22 LAB — CONVERTED CEMR LAB
CO2: 35 meq/L — ABNORMAL HIGH (ref 19–32)
Calcium, Total (PTH): 9.7 mg/dL (ref 8.4–10.5)
Glucose, Bld: 220 mg/dL — ABNORMAL HIGH (ref 70–99)
Potassium: 4.3 meq/L (ref 3.5–5.1)
Sodium: 142 meq/L (ref 135–145)

## 2010-02-02 ENCOUNTER — Telehealth: Payer: Self-pay | Admitting: Endocrinology

## 2010-02-02 ENCOUNTER — Telehealth: Payer: Self-pay | Admitting: Cardiology

## 2010-02-07 ENCOUNTER — Ambulatory Visit: Payer: Self-pay | Admitting: Cardiology

## 2010-02-09 LAB — CONVERTED CEMR LAB
CO2: 36 meq/L — ABNORMAL HIGH (ref 19–32)
Chloride: 91 meq/L — ABNORMAL LOW (ref 96–112)
Sodium: 141 meq/L (ref 135–145)

## 2010-03-05 ENCOUNTER — Telehealth: Payer: Self-pay | Admitting: Endocrinology

## 2010-04-02 ENCOUNTER — Ambulatory Visit: Payer: Self-pay | Admitting: Internal Medicine

## 2010-04-02 DIAGNOSIS — R358 Other polyuria: Secondary | ICD-10-CM

## 2010-04-02 DIAGNOSIS — R3589 Other polyuria: Secondary | ICD-10-CM | POA: Insufficient documentation

## 2010-04-02 LAB — CONVERTED CEMR LAB
Bilirubin Urine: NEGATIVE
Calcium: 9.6 mg/dL (ref 8.4–10.5)
Creatinine, Ser: 1.3 mg/dL — ABNORMAL HIGH (ref 0.4–1.2)
GFR calc non Af Amer: 50.87 mL/min (ref >60)
Hgb A1c MFr Bld: 11.4 % — ABNORMAL HIGH (ref 4.6–6.5)
Nitrite: POSITIVE

## 2010-04-03 ENCOUNTER — Telehealth: Payer: Self-pay | Admitting: Endocrinology

## 2010-04-09 ENCOUNTER — Ambulatory Visit: Payer: Self-pay | Admitting: Endocrinology

## 2010-04-23 ENCOUNTER — Ambulatory Visit: Payer: Self-pay | Admitting: Endocrinology

## 2010-04-30 ENCOUNTER — Encounter: Payer: Self-pay | Admitting: Cardiology

## 2010-04-30 ENCOUNTER — Ambulatory Visit: Payer: Self-pay | Admitting: Cardiology

## 2010-05-02 ENCOUNTER — Encounter: Payer: Self-pay | Admitting: Cardiology

## 2010-05-02 ENCOUNTER — Encounter: Payer: Self-pay | Admitting: Endocrinology

## 2010-05-07 ENCOUNTER — Ambulatory Visit: Payer: Self-pay | Admitting: Endocrinology

## 2010-06-04 ENCOUNTER — Ambulatory Visit (HOSPITAL_COMMUNITY)
Admission: RE | Admit: 2010-06-04 | Discharge: 2010-06-04 | Payer: Self-pay | Source: Home / Self Care | Attending: Obstetrics & Gynecology | Admitting: Obstetrics & Gynecology

## 2010-07-06 ENCOUNTER — Other Ambulatory Visit: Payer: Self-pay | Admitting: Endocrinology

## 2010-07-06 ENCOUNTER — Ambulatory Visit
Admission: RE | Admit: 2010-07-06 | Discharge: 2010-07-06 | Payer: Self-pay | Source: Home / Self Care | Attending: Endocrinology | Admitting: Endocrinology

## 2010-07-06 DIAGNOSIS — R059 Cough, unspecified: Secondary | ICD-10-CM | POA: Insufficient documentation

## 2010-07-06 DIAGNOSIS — R05 Cough: Secondary | ICD-10-CM | POA: Insufficient documentation

## 2010-07-06 LAB — VITAMIN B12: Vitamin B-12: 701 pg/mL (ref 211–911)

## 2010-07-06 LAB — LDL CHOLESTEROL, DIRECT: Direct LDL: 123 mg/dL

## 2010-07-06 LAB — CBC WITH DIFFERENTIAL/PLATELET
Basophils Absolute: 0.1 10*3/uL (ref 0.0–0.1)
Basophils Relative: 0.7 % (ref 0.0–3.0)
Eosinophils Absolute: 0.2 10*3/uL (ref 0.0–0.7)
Eosinophils Relative: 2.2 % (ref 0.0–5.0)
HCT: 45.4 % (ref 36.0–46.0)
Hemoglobin: 15.3 g/dL — ABNORMAL HIGH (ref 12.0–15.0)
Lymphocytes Relative: 24.1 % (ref 12.0–46.0)
Lymphs Abs: 2.1 10*3/uL (ref 0.7–4.0)
MCHC: 33.7 g/dL (ref 30.0–36.0)
MCV: 89.7 fl (ref 78.0–100.0)
Monocytes Absolute: 0.7 10*3/uL (ref 0.1–1.0)
Monocytes Relative: 7.5 % (ref 3.0–12.0)
Neutro Abs: 5.7 10*3/uL (ref 1.4–7.7)
Neutrophils Relative %: 65.5 % (ref 43.0–77.0)
Platelets: 261 10*3/uL (ref 150.0–400.0)
RBC: 5.07 Mil/uL (ref 3.87–5.11)
RDW: 13.9 % (ref 11.5–14.6)
WBC: 8.8 10*3/uL (ref 4.5–10.5)

## 2010-07-06 LAB — LIPID PANEL
Cholesterol: 207 mg/dL — ABNORMAL HIGH (ref 0–200)
HDL: 65.8 mg/dL (ref >39.00)
Total CHOL/HDL Ratio: 3
Triglycerides: 104 mg/dL (ref 0.0–149.0)
VLDL: 20.8 mg/dL (ref 0.0–40.0)

## 2010-07-06 LAB — BASIC METABOLIC PANEL
BUN: 16 mg/dL (ref 6–23)
CO2: 29 mEq/L (ref 19–32)
Calcium: 9.4 mg/dL (ref 8.4–10.5)
Chloride: 106 mEq/L (ref 96–112)
Creatinine, Ser: 1 mg/dL (ref 0.4–1.2)
GFR: 71.28 mL/min (ref >60.00)
Glucose, Bld: 53 mg/dL — ABNORMAL LOW (ref 70–99)
Potassium: 3.9 mEq/L (ref 3.5–5.1)
Sodium: 144 mEq/L (ref 135–145)

## 2010-07-06 LAB — HEMOGLOBIN A1C: Hgb A1c MFr Bld: 10.4 % — ABNORMAL HIGH (ref 4.6–6.5)

## 2010-07-06 LAB — HEPATIC FUNCTION PANEL
ALT: 22 U/L (ref 0–35)
AST: 23 U/L (ref 0–37)
Albumin: 4 g/dL (ref 3.5–5.2)
Alkaline Phosphatase: 69 U/L (ref 39–117)
Bilirubin, Direct: 0.1 mg/dL (ref 0.0–0.3)
Total Bilirubin: 0.8 mg/dL (ref 0.3–1.2)
Total Protein: 7 g/dL (ref 6.0–8.3)

## 2010-07-06 LAB — BRAIN NATRIURETIC PEPTIDE: Pro B Natriuretic peptide (BNP): 576.9 pg/mL — ABNORMAL HIGH (ref 0.0–100.0)

## 2010-07-06 LAB — TSH: TSH: 1.86 u[IU]/mL (ref 0.35–5.50)

## 2010-07-07 ENCOUNTER — Encounter: Payer: Self-pay | Admitting: Endocrinology

## 2010-07-08 ENCOUNTER — Encounter: Payer: Self-pay | Admitting: Endocrinology

## 2010-07-09 ENCOUNTER — Ambulatory Visit: Admit: 2010-07-09 | Payer: Self-pay | Admitting: Endocrinology

## 2010-07-16 ENCOUNTER — Other Ambulatory Visit: Payer: Self-pay | Admitting: Endocrinology

## 2010-07-16 ENCOUNTER — Ambulatory Visit
Admission: RE | Admit: 2010-07-16 | Discharge: 2010-07-16 | Payer: Self-pay | Source: Home / Self Care | Attending: Endocrinology | Admitting: Endocrinology

## 2010-07-16 LAB — BRAIN NATRIURETIC PEPTIDE: Pro B Natriuretic peptide (BNP): 273.2 pg/mL — ABNORMAL HIGH (ref 0.0–100.0)

## 2010-07-16 LAB — BASIC METABOLIC PANEL
BUN: 33 mg/dL — ABNORMAL HIGH (ref 6–23)
CO2: 30 mEq/L (ref 19–32)
Chloride: 93 mEq/L — ABNORMAL LOW (ref 96–112)
Glucose, Bld: 211 mg/dL — ABNORMAL HIGH (ref 70–99)
Potassium: 4 mEq/L (ref 3.5–5.1)

## 2010-07-17 ENCOUNTER — Encounter: Payer: Self-pay | Admitting: Endocrinology

## 2010-07-17 ENCOUNTER — Ambulatory Visit
Admission: RE | Admit: 2010-07-17 | Discharge: 2010-07-17 | Payer: Self-pay | Source: Home / Self Care | Attending: Endocrinology | Admitting: Endocrinology

## 2010-07-19 NOTE — Progress Notes (Signed)
Summary: tramadol  Phone Note From Pharmacy   Caller: [facility] [address] [phone]* Summary of Call: per Pt, pt's Tramadol 50 mg is not working. She states that the medication wears off fast. Pt is requesting stronger dosage or different medication. Please advise Initial call taken by: Brenton Grills MA,  March 05, 2010 4:45 PM  Follow-up for Phone Call        i printed new rx Follow-up by: Minus Breeding MD,  March 06, 2010 10:39 AM  Additional Follow-up for Phone Call Additional follow up Details #1::        faxed to [facility] on [address] Additional Follow-up by: Brenton Grills MA,  March 06, 2010 10:52 AM    New/Updated Medications: HYDROCODONE-ACETAMINOPHEN 10-325 MG TABS (HYDROCODONE-ACETAMINOPHEN) 1/2 or 1 pill every 4 hrs as needed for pain Prescriptions: HYDROCODONE-ACETAMINOPHEN 10-325 MG TABS (HYDROCODONE-ACETAMINOPHEN) 1/2 or 1 pill every 4 hrs as needed for pain  #50 x 0   Entered and Authorized by:   Minus Breeding MD   Signed by:   Minus Breeding MD on 03/06/2010   Method used:   Printed then faxed to ...       [facility] [address] [facility]* (retail)       [address]       [place], [place]  [address]       Ph: [phone] or [phone]       Fax: [phone]   RxID:   [id number]

## 2010-07-19 NOTE — Progress Notes (Signed)
Summary: rx refill req  Phone Note Refill Request Message from:  Fax from Pharmacy on April 03, 2010 9:47 AM  Refills Requested: Medication #1:  HYDROCODONE-ACETAMINOPHEN 10-325 MG TABS 1/2 or 1 pill every 4 hrs as needed for pain   Dosage confirmed as above?Dosage Confirmed   Last Refilled: 03/06/2010   Notes: [facility] [address] [phone] fax Initial call taken by: Brenton Grills MA,  April 03, 2010 9:51 AM  Follow-up for Phone Call        rx faxed to [facility] Follow-up by: Brenton Grills MA,  April 03, 2010 1:26 PM    Prescriptions: HYDROCODONE-ACETAMINOPHEN 10-325 MG TABS (HYDROCODONE-ACETAMINOPHEN) 1/2 or 1 pill every 4 hrs as needed for pain  #50 x 2   Entered and Authorized by:   Minus Breeding MD   Signed by:   Minus Breeding MD on 04/03/2010   Method used:   Print then Give to Patient   RxID:   [id number]  i printed

## 2010-07-19 NOTE — Assessment & Plan Note (Signed)
Summary: elevated bp /SD   Vital Signs:  Patient profile:   77 year old female Height:      66 inches Weight:      212 pounds BMI:     34.34 O2 Sat:      95 % on Room air Temp:     97.7 degrees F oral Pulse rate:   77 / minute BP sitting:   146 / 84  (left arm) Cuff size:   large  Vitals Entered By: Zella Ball Ewing CMA (AAMA) (December 19, 2009 4:13 PM)  O2 Flow:  Room air CC: BP elevated, SOB/RE   Primary Care Provider:  Romero Belling, MD  CC:  BP elevated and SOB/RE.  History of Present Illness: here after being seen per orthopedic today,  pt was supposed to be fasting so she did not take her BP meds this AM;  BP noted 166/78, and later 196/78;  seemed to be o/w asymp and asked to f/u here;  here pt states BP at home recetnly < 140/90 with good compliacne with meds and good tolerability.  Pt denies CP,  orthopnea, pnd, worsening LE edema, palps, dizziness or syncope   Pt denies new neuro symptoms such as headache, facial or extremity weakness   Pt denies polydipsia, polyuria, or low sugar symptoms such as shakiness improved with eating.  Overall good compliance with meds, trying to follow low chol, DM diet, wt stable, little excercise however   Incidently woke up this am with mild URI symptoms with feeling warm, and mild sinus congestion but no ST or other as above., except did have some mild sob and wheezing this am as well.  Had some sweating this am as well,  Could not tolerate the CPAP last night, but did improve with advair this am.    Problems Prior to Update: 1)  Asthma, With Acute Exacerbation  (ICD-493.92) 2)  Uri  (ICD-465.9) 3)  Abdominal Pain  (ICD-789.00) 4)  Diastolic Heart Failure, Acute On Chronic  (ICD-428.33) 5)  Proteinuria  (ICD-791.0) 6)  Carotid Bruit, Left  (ICD-785.9) 7)  Encounter For Long-term Use of Other Medications  (ICD-V58.69) 8)  Weight Loss-abnormal  (ICD-783.21) 9)  Dysphagia  (ICD-787.29) 10)  Fm Hx Malignant Neoplasm Gastrointestinal Tract   (ICD-V16.0) 11)  Obstructive Sleep Apnea  (ICD-327.23) 12)  Pers Hx Noncompliance W/med Tx Prs [facility]  (ICD-V15.81) 13)  Routine General Medical Exam@health  Care Facl  (ICD-V70.0) 14)  Renal Insufficiency  (ICD-588.9) 15)  Fever Unspecified  (ICD-780.60) 16)  Abdominal Pain, Right Upper Quadrant  (ICD-789.01) 17)  Osteoporosis  (ICD-733.00) 18)  Foot Pain, Bilateral  (ICD-729.5) 19)  Edema  (ICD-782.3) 20)  Elevated D-dimer, Neg V/q Scan  () 21)  Dyspnea  (ICD-786.05) 22)  Peptic Ulcer Disease  (ICD-533.90) 23)  Depression  (ICD-311) 24)  Asthma  (ICD-493.90) 25)  COPD  (ICD-496) 26)  Peripheral Neuropathy  (ICD-356.9) 27)  Hyperlipidemia  (ICD-272.4) 28)  Pneumonia  (ICD-486) 29)  Osteoarthritis  (ICD-715.90) 30)  Peripheral Vascular Disease  (ICD-443.9) 31)  Hypertension  (ICD-401.9) 32)  Gerd  (ICD-530.81) 33)  Diabetes Mellitus, Type II  (ICD-250.00) 34)  Coronary Artery Disease  (ICD-414.00)  Medications Prior to Update: 1)  Klor-Con M20 20 Meq  Tbcr (Potassium Chloride Crys Cr) .Marland Kitchen.. 1 Tab Three Times A Day 2)  Isosorbide Dinitrate 30 Mg  Tabs (Isosorbide Dinitrate) .... Take 1 By Mouth Two Times A Day 3)  Advair Diskus 100-50 Mcg/dose  Misc (Fluticasone-Salmeterol) .Marland KitchenMarland KitchenMarland Kitchen 1  Puff Two Times A Day\par 4)  Lasix 80 Mg  Tabs (Furosemide) .Marland Kitchen.. 1 1/2 Tab Two Times A Day 5)  Tramadol Hcl 50 Mg  Tabs (Tramadol Hcl) .... Take 1/2 Q 4 Hours Prn 6)  Novolin 70/30 70-30 %  Susp (Insulin Isophane & Regular) .... 220 Units Each Am 7)  Metolazone 5 Mg  Tabs (Metolazone) .Marland Kitchen.. 1 Tab Two Times A Day 8)  Crestor 40 Mg Tabs (Rosuvastatin Calcium) .... Once Daily 9)  Hydrocodone-Acetaminophen 5-325 Mg Tabs (Hydrocodone-Acetaminophen) .... Take 1-2 By Mouth Q 6 Hours Prn 10)  Hydralazine Hcl 25 Mg Tabs (Hydralazine Hcl) .... Three Times A Day 11)  Freestyle Test  Strp (Glucose Blood) .... 2x A Day, and Lancets, 250.01 12)  Labetalol Hcl 200 Mg Tabs (Labetalol Hcl) .... Take Two Tablets By  Mouth Twice A Day 13)  Zetia 10 Mg Tabs (Ezetimibe) .Marland Kitchen.. 1 Once Daily 14)  Aspirin 325 Mg Tabs (Aspirin) .Marland Kitchen.. 1 Once Daily  Current Medications (verified): 1)  Klor-Con M20 20 Meq  Tbcr (Potassium Chloride Crys Cr) .Marland Kitchen.. 1 Tab Three Times A Day 2)  Isosorbide Dinitrate 30 Mg  Tabs (Isosorbide Dinitrate) .... Take 1 By Mouth Two Times A Day 3)  Advair Diskus 100-50 Mcg/dose  Misc (Fluticasone-Salmeterol) .Marland Kitchen.. 1 Puff Two Times A Day\par 4)  Lasix 80 Mg  Tabs (Furosemide) .Marland Kitchen.. 1 1/2 Tab Two Times A Day 5)  Tramadol Hcl 50 Mg  Tabs (Tramadol Hcl) .... Take 1/2 Q 4 Hours Prn 6)  Novolin 70/30 70-30 %  Susp (Insulin Isophane & Regular) .... 220 Units Each Am 7)  Metolazone 5 Mg  Tabs (Metolazone) .Marland Kitchen.. 1 Tab Two Times A Day 8)  Crestor 40 Mg Tabs (Rosuvastatin Calcium) .... Once Daily 9)  Hydrocodone-Acetaminophen 5-325 Mg Tabs (Hydrocodone-Acetaminophen) .... Take 1-2 By Mouth Q 6 Hours Prn 10)  Hydralazine Hcl 25 Mg Tabs (Hydralazine Hcl) .... Three Times A Day 11)  Freestyle Test  Strp (Glucose Blood) .... 2x A Day, and Lancets, 250.01 12)  Labetalol Hcl 200 Mg Tabs (Labetalol Hcl) .... Take Two Tablets By Mouth Twice A Day 13)  Zetia 10 Mg Tabs (Ezetimibe) .Marland Kitchen.. 1 Once Daily 14)  Aspirin 325 Mg Tabs (Aspirin) .Marland Kitchen.. 1 Once Daily 15)  Prednisone 10 Mg Tabs (Prednisone) .... 3po Qd For 3days, Then 2po Qd For 3days, Then 1po Qd For 3days, Then Stop  Allergies (verified): 1)  ! Norvasc 2)  ! * Iv Dye  Past History:  Past Medical History: Last updated: 04/05/2009 Congestive heart failure - diastolic non-ischemic cardiomyopathy Coronary artery disease - minimal Diabetes mellitus, type II GERD Hypertension Peripheral vascular disease Obese Dyslipidemia Zoster DM Peripheral Neuropathy Osteoarthritis spine - cervical Non-compliance DM Retinopathy Mild asthma Sleep Apnea -iwith CPAP Idiopathic Pruritis ASCVD on Mri Brain Hyperlipidemia hx of shingles Peripheral  neuropathy COPD Asthma uterine cancer Depression Peptic ulcer disease - duodenal Peptic stricture Hiatal hernia Diverticulosis Arthritis Congestive Heart Failure Urinary Tract Infection  Past Surgical History: Last updated: 04/05/2009 Tubal ligation (1967) (L) Knee Arthroscopy (10/1998) (L) Craniotomy for SDH (1997) DEXA (12/2003) Bilateral cataracts (2005) EGD (04/04/2004) Hysterectomy c-spine and lumbar surgury cholecystectomy 2010 Heart Caths  Social History: Last updated: 04/05/2009 Alcohol use-yes - rare Married retired 7 children Patient never smoked.  Illicit Drug Use - no  Risk Factors: Smoking Status: never (10/12/2008)  Review of Systems       all otherwise negative per pt -    Physical Exam  General:  alert and overweight-appearing.  Head:  normocephalic and atraumatic.   Eyes:  vision grossly intact, pupils equal, and pupils round.   Ears:   tm's red, sinus nontender Nose:  no external deformity and no nasal discharge.   Mouth:  pharyngeal erythema and fair dentition.   Neck:  supple and no masses.   Lungs:  normal respiratory effort, R decreased breath sounds, and L decreased breath sounds.  ,  and few insp wheezes noted Heart:  normal rate and regular rhythm.   Extremities:  no edema, no erythema    Impression & Recommendations:  Problem # 1:  URI (ICD-465.9)  Her updated medication list for this problem includes:    Aspirin 325 Mg Tabs (Aspirin) .Marland Kitchen... 1 once daily mild, c/w prob viral illness, asympt  - ok to follow  Problem # 2:  ASTHMA, WITH ACUTE EXACERBATION (ICD-493.92)  Her updated medication list for this problem includes:    Advair Diskus 100-50 Mcg/dose Misc (Fluticasone-salmeterol) .Marland Kitchen... 1 puff two times a day\    Prednisone 10 Mg Tabs (Prednisone) .Marland Kitchen... 3po qd for 3days, then 2po qd for 3days, then 1po qd for 3days, then stop mild, likely due to above, for prednisone lower dose pack  Problem # 3:  HYPERTENSION  (ICD-401.9)  Her updated medication list for this problem includes:    Lasix 80 Mg Tabs (Furosemide) .Marland Kitchen... 1 1/2 tab two times a day    Metolazone 5 Mg Tabs (Metolazone) .Marland Kitchen... 1 tab two times a day    Hydralazine Hcl 25 Mg Tabs (Hydralazine hcl) .Marland Kitchen... Three times a day    Labetalol Hcl 200 Mg Tabs (Labetalol hcl) .Marland Kitchen... Take two tablets by mouth twice a day  BP today: 146/84 Prior BP: 138/78 (12/08/2009)  Labs Reviewed: K+: 3.8 (10/31/2009) Creat: : 1.2 (10/31/2009)   Chol: 188 (05/18/2009)   HDL: 65.10 (05/18/2009)   LDL: 103 (05/18/2009)   TG: 102.0 (05/18/2009) mild elev today, likely situational, ok to follow, continue same treatment  - re-start med  Problem # 4:  DIABETES MELLITUS, TYPE II (ICD-250.00)  Her updated medication list for this problem includes:    Novolin 70/30 70-30 % Susp (Insulin isophane & regular) .Marland Kitchen... 220 units each am    Aspirin 325 Mg Tabs (Aspirin) .Marland Kitchen... 1 once daily  Labs Reviewed: Creat: 1.2 (10/31/2009)    Reviewed HgBA1c results: 9.1 (10/16/2009)  9.0 (05/18/2009) stable overall by hx and exam, ok to continue meds/tx as is   Complete Medication List: 1)  Klor-con M20 20 Meq Tbcr (Potassium chloride crys cr) .Marland Kitchen.. 1 tab three times a day 2)  Isosorbide Dinitrate 30 Mg Tabs (Isosorbide dinitrate) .... Take 1 by mouth two times a day 3)  Advair Diskus 100-50 Mcg/dose Misc (Fluticasone-salmeterol) .Marland Kitchen.. 1 puff two times a day\par 4)  Lasix 80 Mg Tabs (Furosemide) .Marland Kitchen.. 1 1/2 tab two times a day 5)  Tramadol Hcl 50 Mg Tabs (Tramadol hcl) .... Take 1/2 q 4 hours prn 6)  Novolin 70/30 70-30 % Susp (Insulin isophane & regular) .... 220 units each am 7)  Metolazone 5 Mg Tabs (Metolazone) .Marland Kitchen.. 1 tab two times a day 8)  Crestor 40 Mg Tabs (Rosuvastatin calcium) .... Once daily 9)  Hydrocodone-acetaminophen 5-325 Mg Tabs (Hydrocodone-acetaminophen) .... Take 1-2 by mouth q 6 hours prn 10)  Hydralazine Hcl 25 Mg Tabs (Hydralazine hcl) .... Three times a day 11)   Freestyle Test Strp (Glucose blood) .... 2x a day, and lancets, 250.01 12)  Labetalol Hcl 200 Mg Tabs (Labetalol hcl) .Marland KitchenMarland KitchenMarland Kitchen  Take two tablets by mouth twice a day 13)  Zetia 10 Mg Tabs (Ezetimibe) .Marland Kitchen.. 1 once daily 14)  Aspirin 325 Mg Tabs (Aspirin) .Marland Kitchen.. 1 once daily 15)  Prednisone 10 Mg Tabs (Prednisone) .... 3po qd for 3days, then 2po qd for 3days, then 1po qd for 3days, then stop  Patient Instructions: 1)  Please take all new medications as prescribed  - the lower dose prednisone - sent to [facility] 2)  Continue all previous medications as before this visit (including your blood pressure medicines - no change) 3)  Please schedule an appointment with your primary doctor as you normally would have planned Prescriptions: PREDNISONE 10 MG TABS (PREDNISONE) 3po qd for 3days, then 2po qd for 3days, then 1po qd for 3days, then stop  #18 x 0   Entered and Authorized by:   Corwin Levins MD   Signed by:   Corwin Levins MD on 12/19/2009   Method used:   Electronically to        [facility] [address] [facility]* (retail)       [address]       [place], [place]  [address]       Ph: [phone] or [phone]       Fax: [phone]   RxID:   [id number]

## 2010-07-19 NOTE — Progress Notes (Signed)
Summary: APT TODAY  Phone Note Call from Patient   Summary of Call: Pt had injection on her back this am, says they "burnt the nerves on her back". Pt's bp today at ortho 196/78, p59. Ortho advised office visit w/PCP today. Scheduled for apt w/Dr Jonny Ruiz Initial call taken by: Lamar Sprinkles, CMA,  December 19, 2009 12:18 PM

## 2010-07-19 NOTE — Assessment & Plan Note (Signed)
Summary: 2 WK FU  STC   Vital Signs:  Patient profile:   77 year old female Height:      65 inches (165.10 cm) Weight:      216.75 pounds (98.52 kg) BMI:     36.20 O2 Sat:      95 % on Room air Temp:     98.5 degrees F (36.94 degrees C) oral Pulse rate:   78 / minute BP sitting:   126 / 74  (left arm) Cuff size:   large  Vitals Entered By: Brenton Grills CMA Duncan Dull) (April 23, 2010 2:09 PM)  O2 Flow:  Room air CC: 2 week F/U/pt declined flu shot/aj Is Patient Diabetic? Yes   Referring Provider:  Re- Establish/ Former Pt Primary Provider:  Romero Belling, MD  CC:  2 week F/U/pt declined flu shot/aj.  History of Present Illness: pt is having frequent hypoglycemia before lunch.  she brings a record of her cbg's which i have reviewed today.  she brings a record of her cbg's which i have reviewed today--checked before breakfast and before lunch only.  it is 81--490 before breakfast, and usually 45-0 before lunch.    Current Medications (verified): 1)  Klor-Con M20 20 Meq  Tbcr (Potassium Chloride Crys Cr) .Marland Kitchen.. 1 Tab Three Times A Day 2)  Isosorbide Dinitrate 30 Mg  Tabs (Isosorbide Dinitrate) .... Take 1 By Mouth Two Times A Day 3)  Advair Diskus 100-50 Mcg/dose  Misc (Fluticasone-Salmeterol) .Marland Kitchen.. 1 Puff Two Times A Day\par 4)  Lasix 80 Mg  Tabs (Furosemide) .Marland Kitchen.. 1 1/2 Tab Two Times A Day 5)  Metolazone 5 Mg  Tabs (Metolazone) .Marland Kitchen.. 1 Tab Two Times A Day 6)  Crestor 40 Mg Tabs (Rosuvastatin Calcium) .... Once Daily 7)  Hydralazine Hcl 25 Mg Tabs (Hydralazine Hcl) .... Three Times A Day 8)  Freestyle Test  Strp (Glucose Blood) .... 2x A Day, and Lancets, 250.01 9)  Labetalol Hcl 200 Mg Tabs (Labetalol Hcl) .... Take Two Tablets By Mouth Twice A Day 10)  Zetia 10 Mg Tabs (Ezetimibe) .Marland Kitchen.. 1 Once Daily 11)  Aspirin 325 Mg Tabs (Aspirin) .Marland Kitchen.. 1 Once Daily 12)  Cpap Machine .... At Bedtime 13)  Hydrocodone-Acetaminophen 10-325 Mg Tabs (Hydrocodone-Acetaminophen) .... 1/2 or 1 Pill Every  4 Hrs As Needed For Pain 14)  Humulin N 100 Unit/ml Susp (Insulin Isophane Human) .... 200 Units Am and 40 Units in The Evening  Allergies (verified): 1)  ! Norvasc 2)  ! 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    Physical Exam  General:  obese.  no distress  Msk:  gait is normal and steady with a cane    Impression & Recommendations:  Problem # 1:  DIABETES MELLITUS, TYPE II (ICD-250.00) overcontrolled.  Medications Added to Medication List This Visit: 1)  Humulin N 100 Unit/ml Susp (Insulin isophane human) .Marland KitchenMarland KitchenMarland Kitchen 175 units am and 40 units in the evening  Other Orders: Est. Patient Level III (04540)  Patient Instructions: 1)  reduce nph humulin to 175 units each am and 40 units in the evening. 2)  check your blood sugar 2 times a day.  vary the time of day when you check, between before the 3 meals, and at bedtime.  also check if you have symptoms of your blood sugar being too high or too low.  please keep a record of the readings and bring it to your next appointment here.  please call us sooner if you are having low blood sugar episodes. 3)  Please schedule a follow-up appointment in 2 weeks.   Orders Added: 1)  Est. Patient Level III [54098]

## 2010-07-19 NOTE — Progress Notes (Signed)
  Phone Note Refill Request Message from:  Fax from Pharmacy on November 30, 2009 2:54 PM  Refills Requested: Medication #1:  TRAMADOL HCL 50 MG  TABS take 1/2 q 4 hours prn   Dosage confirmed as above?Dosage Confirmed   Last Refilled: 10/02/2009   Notes: [facility], [place], [phone] Initial call taken by: Scharlene Gloss,  November 30, 2009 2:55 PM  Follow-up for Phone Call        i refilled Follow-up by: Minus Breeding MD,  November 30, 2009 4:55 PM    Prescriptions: TRAMADOL HCL 50 MG  TABS (TRAMADOL HCL) take 1/2 q 4 hours prn  #50 Tablet x 2   Entered and Authorized by:   Minus Breeding MD   Signed by:   Minus Breeding MD on 11/30/2009   Method used:   Electronically to        [facility] [address] [facility] (retail)       [address]       [place], [place]  [address]       Ph: [phone] or [phone]       Fax: [phone]   RxID:   [id number]

## 2010-07-19 NOTE — Progress Notes (Signed)
Summary: feet and legs swollen/cramping/ will discuss with dr Riley Kill   Phone Note Call from Patient   Caller: Patient [phone] Reason for Call: Talk to Nurse Summary of Call: pt's feet and legs swollen x 1 week-also having cramps pls advise [phone] Initial call taken by: Glynda Jaeger,  February 02, 2010 1:06 PM  Follow-up for Phone Call        SPOKE WITH PT C/O EDEMA TO LEGS AND WEIGHT UP 4 #.PER DR  STUCKEY PT TO TAKE FUROSEMIDE 80 MG 2 TABS two times a day  X 2 DAYS THEN RESUME PREVIOUS DOSE AND TO MONITOR WEIGHT AND TO F/U WITH DR WALL NEXT WEEK.APPT WITH DR WALL SCHEDULED FOR 02/07/10 AT 9:00 AM. PT AWARE. Follow-up by: Scherrie Bateman, LPN,  February 02, 2010 3:03 PM  Additional Follow-up for Phone Call Additional follow up Details #1::        agree with plan Additional Follow-up by: Gaylord Shih, MD, [facility],  February 05, 2010 10:46 AM

## 2010-07-19 NOTE — Assessment & Plan Note (Signed)
Summary: per check out/sf    Visit Type:  3  mo f/u Referring Provider:  Re- Establish/ Former Pt Primary Provider:  Romero Belling, MD  CC:  sob...edema/left ankle...fatigue. pt states not sleeping well in the PM..pt down 7 lb .  History of Present Illness: Mrs Colborn comes in today for evaluation and management of her chronic diastolic dysfunction, artery or artery disease, hypertension, and lower extremity edema.  She's doing remarkably well. She's lost 7 pounds and her dyspnea has improved. She denies orthopnea, PND or edema.  She is having watery diarrhea about every 2-3 days. We have called her primary care who will see her today.  Current Medications (verified): 1)  Klor-Con M20 20 Meq  Tbcr (Potassium Chloride Crys Cr) .Marland Kitchen.. 1 Tab Three Times A Day 2)  Isosorbide Dinitrate 30 Mg  Tabs (Isosorbide Dinitrate) .... Take 1 By Mouth Bid 3)  Advair Diskus 100-50 Mcg/dose  Misc (Fluticasone-Salmeterol) .Marland Kitchen.. 1 Puff Bid 4)  Aspirin 325 Mg  Tbec (Aspirin) .... Take 1 By Mouth Qd 5)  Lasix 80 Mg  Tabs (Furosemide) .Marland Kitchen.. 1 1/2 Tab Two Times A Day 6)  Tramadol Hcl 50 Mg  Tabs (Tramadol Hcl) .... Take 1/2 Q 4 Hours Prn 7)  Promethazine Hcl 25 Mg  Tabs (Promethazine Hcl) .... Take 1 By Mouth Prn 8)  Novolin 70/30 70-30 %  Susp (Insulin Isophane & Regular) .Marland KitchenMarland KitchenMarland Kitchen 190 Units Q.a.m., 10 Units Q.p.m. 9)  Metolazone 5 Mg  Tabs (Metolazone) .... Qd 10)  Crestor 40 Mg Tabs (Rosuvastatin Calcium) .... Qd 11)  Hydrocodone-Acetaminophen 5-325 Mg Tabs (Hydrocodone-Acetaminophen) .... Take 1-2 By Mouth Q 6 Hours Prn 12)  Hydralazine Hcl 25 Mg Tabs (Hydralazine Hcl) .... Tid 13)  Freestyle Test  Strp (Glucose Blood) .... Qid, and Lancets, 250.01, Variable Glucoses 14)  Labetalol Hcl 200 Mg Tabs (Labetalol Hcl) .... Take Two Tablets By Mouth Twice A Day 15)  Zetia 10 Mg Tabs (Ezetimibe) .Marland Kitchen.. 1 Qd  Allergies: 1)  ! Norvasc 2)  ! * Iv Dye  Past History:  Past Medical History: Last updated:  04/05/2009 Congestive heart failure - diastolic non-ischemic cardiomyopathy Coronary artery disease - minimal Diabetes mellitus, type II GERD Hypertension Peripheral vascular disease Obese Dyslipidemia Zoster DM Peripheral Neuropathy Osteoarthritis spine - cervical Non-compliance DM Retinopathy Mild asthma Sleep Apnea -iwith CPAP Idiopathic Pruritis ASCVD on Mri Brain Hyperlipidemia hx of shingles Peripheral neuropathy COPD Asthma uterine cancer Depression Peptic ulcer disease - duodenal Peptic stricture Hiatal hernia Diverticulosis Arthritis Congestive Heart Failure Urinary Tract Infection  Past Surgical History: Last updated: 04/05/2009 Tubal ligation (1967) (L) Knee Arthroscopy (10/1998) (L) Craniotomy for SDH (1997) DEXA (12/2003) Bilateral cataracts (2005) EGD (04/04/2004) Hysterectomy c-spine and lumbar surgury cholecystectomy 2010 Heart Caths  Family History: Last updated: 04/05/2009 father with pacemaker, lymphoma mother with "stomach" cancer asthma rheumatism: father ,mother  Family History of Diabetes: mother Family History of Heart Disease: mother Family History of Kidney Disease: PGM  Social History: Last updated: 04/05/2009 Alcohol use-yes - rare Married retired 7 children Patient never smoked.  Illicit Drug Use - no  Risk Factors: Smoking Status: never (10/12/2008)  Review of Systems       negative other than history of present illness  Vital Signs:  Patient profile:   77 year old female Height:      66 inches Weight:      210 pounds BMI:     34.02 Pulse rate:   72 / minute Pulse rhythm:   regular BP sitting:  132 / 60  (left arm) Cuff size:   large  Vitals Entered By: Danielle Rankin, CMA (Oct 16, 2009 10:01 AM)  Physical Exam  General:  obese.  no acute distressobese.   Head:  normocephalic and atraumatic Eyes:  PERRLA/EOM intact; conjunctiva and lids normal. Neck:  Neck supple, no JVD. No masses, thyromegaly or  abnormal cervical nodes. Chest Estill Llerena:  no deformities or breast masses noted Lungs:  Clear bilaterally to auscultation and percussion. Heart:  Non-displaced PMI, chest non-tender; regular rate and rhythm, S1, S2 without murmurs, rubs or gallops. Carotid upstroke normal, right carotid bruit Normal abdominal aortic size, no bruits. Femorals normal pulses, no bruits. Pedals normal pulses. No edema, no varicosities. Msk:  decreased ROM.  decreased ROM.   Pulses:  pulses normal in all 4 extremities Extremities:  No clubbing or cyanosis. Neurologic:  Alert and oriented x 3. Skin:  Intact without lesions or rashes. Psych:  Normal affect.   Impression & Recommendations:  Problem # 1:  DIASTOLIC HEART FAILURE, ACUTE ON CHRONIC (ICD-428.33) Assessment Improved  Her updated medication list for this problem includes:    Isosorbide Dinitrate 30 Mg Tabs (Isosorbide dinitrate) .Marland Kitchen... Take 1 by mouth bid    Aspirin 325 Mg Tbec (Aspirin) .Marland Kitchen... Take 1 by mouth qd    Lasix 80 Mg Tabs (Furosemide) .Marland Kitchen... 1 1/2 tab two times a day    Metolazone 5 Mg Tabs (Metolazone) ..... Qd    Labetalol Hcl 200 Mg Tabs (Labetalol hcl) .Marland Kitchen... Take two tablets by mouth twice a day  Her updated medication list for this problem includes:    Isosorbide Dinitrate 30 Mg Tabs (Isosorbide dinitrate) .Marland Kitchen... Take 1 by mouth bid    Aspirin 325 Mg Tbec (Aspirin) .Marland Kitchen... Take 1 by mouth qd    Lasix 80 Mg Tabs (Furosemide) .Marland Kitchen... 1 1/2 tab two times a day    Metolazone 5 Mg Tabs (Metolazone) ..... Qd    Labetalol Hcl 200 Mg Tabs (Labetalol hcl) .Marland Kitchen... Take two tablets by mouth twice a day  Problem # 2:  CAROTID BRUIT, LEFT (ICD-785.9) Assessment: Unchanged  Problem # 3:  EDEMA (ICD-782.3) Assessment: Improved  Problem # 4:  CORONARY ARTERY DISEASE (ICD-414.00) Assessment: Unchanged  Her updated medication list for this problem includes:    Isosorbide Dinitrate 30 Mg Tabs (Isosorbide dinitrate) .Marland Kitchen... Take 1 by mouth bid    Aspirin 325  Mg Tbec (Aspirin) .Marland Kitchen... Take 1 by mouth qd    Labetalol Hcl 200 Mg Tabs (Labetalol hcl) .Marland Kitchen... Take two tablets by mouth twice a day  Her updated medication list for this problem includes:    Isosorbide Dinitrate 30 Mg Tabs (Isosorbide dinitrate) .Marland Kitchen... Take 1 by mouth bid    Aspirin 325 Mg Tbec (Aspirin) .Marland Kitchen... Take 1 by mouth qd    Labetalol Hcl 200 Mg Tabs (Labetalol hcl) .Marland Kitchen... Take two tablets by mouth twice a day  Problem # 5:  DYSPNEA (ICD-786.05) Assessment: Improved I have reinforced to her weight loss has helped this. Her updated medication list for this problem includes:    Aspirin 325 Mg Tbec (Aspirin) .Marland Kitchen... Take 1 by mouth qd    Lasix 80 Mg Tabs (Furosemide) .Marland Kitchen... 1 1/2 tab two times a day    Metolazone 5 Mg Tabs (Metolazone) ..... Qd    Labetalol Hcl 200 Mg Tabs (Labetalol hcl) .Marland Kitchen... Take two tablets by mouth twice a day  Problem # 6:  HYPERLIPIDEMIA (ICD-272.4) Assessment: Unchanged  Her updated medication list for this  problem includes:    Crestor 40 Mg Tabs (Rosuvastatin calcium) ..... Qd    Zetia 10 Mg Tabs (Ezetimibe) .Marland Kitchen... 1 qd  Her updated medication list for this problem includes:    Crestor 40 Mg Tabs (Rosuvastatin calcium) ..... Qd    Zetia 10 Mg Tabs (Ezetimibe) .Marland Kitchen... 1 qd  Problem # 7:  HYPERTENSION (ICD-401.9) Assessment: Improved  Her updated medication list for this problem includes:    Aspirin 325 Mg Tbec (Aspirin) .Marland Kitchen... Take 1 by mouth qd    Lasix 80 Mg Tabs (Furosemide) .Marland Kitchen... 1 1/2 tab two times a day    Metolazone 5 Mg Tabs (Metolazone) ..... Qd    Hydralazine Hcl 25 Mg Tabs (Hydralazine hcl) .Marland Kitchen... Tid    Labetalol Hcl 200 Mg Tabs (Labetalol hcl) .Marland Kitchen... Take two tablets by mouth twice a day  Her updated medication list for this problem includes:    Aspirin 325 Mg Tbec (Aspirin) .Marland Kitchen... Take 1 by mouth qd    Lasix 80 Mg Tabs (Furosemide) .Marland Kitchen... 1 1/2 tab two times a day    Metolazone 5 Mg Tabs (Metolazone) ..... Qd    Hydralazine Hcl 25 Mg Tabs  (Hydralazine hcl) .Marland Kitchen... Tid    Labetalol Hcl 200 Mg Tabs (Labetalol hcl) .Marland Kitchen... Take two tablets by mouth twice a day  Patient Instructions: 1)  Your physician recommends that you schedule a follow-up appointment in: WITH DR Robbi Spells 2)  Your physician recommends that you continue on your current medications as directed. Please refer to the Current Medication list given to you today.

## 2010-07-19 NOTE — Assessment & Plan Note (Signed)
Summary: edema rov per dr Riley Kill appt at 9:00 am./cy  Medications Added * CPAP MACHINE at bedtime        Visit Type:  rov Referring Provider:  Re- Establish/ Former Pt Primary Provider:  Romero Belling, MD  CC:  edema and cramps in legs....sob....denies any cp.  History of Present Illness: [name] comes in today for further evaluation and management of her diastolic dysfunction and chronic diastolic heart failure. She called and was swelling and weight gain of 4 pounds. Please refer to that note. We increased her Lasix and she's also on metolazone twice a day.  Her weights gone down to baseline and she has no further edema. Unfortunately she has a lot of leg cramps. These are usually at night. She has ivory soap on her sheets which he says helps.  She denies any angina or ischemic symptoms. She is wearing CPAP at night. He complains of no orthopnea, PND.  Clinical Reports Reviewed:  Cardiac Cath:  07/04/2008: Cardiac Cath Findings:   ASSESSMENT:   1. Nonobstructive coronary artery disease.   2. Severe hypertension.      PLAN/DISCUSSION:  I have reviewed the catheterization films with Dr.   Clifton James.  We both feel that the RCA lesion is not flow limiting.  I   suspect her chest pain is due to her severe hypertension and probable   elevated LVEDP.  We will continue risk factor management.  Currently,   she can proceed to surgery without further cardiac workup.               Bevelyn Buckles. Bensimhon, MD   Electronically Signed         06/12/2006: Cardiac Cath Findings:   CONCLUSION:  1. Nonobstructive coronary artery disease with 30% narrowing in the      mid-LAD, 40% narrowing in the mid distal circumflex artery, and 30%      narrowing in the proximal right coronary artery with normal left      ventricular function and estimated ejection fraction of 60%.  2. Elevated pulmonary artery wedge pressure and elevated pulmonary      artery pressure and pulmonary vascular  resistance.   RECOMMENDATIONS:  The patient has no source of ischemia.  She does have  elevated left ventricular filling pressures consistent with diastolic  dysfunction.  She also has pulmonary hypertension and elevated pulmonary  resistance out of proportion to her filling pressures.  This may be  multiple multifactorial, including secondary to obstructive sleep apnea  and her elevated pulmonary wedge pressure.  She does have an elevated D-  dimer and we  will plan to do a CT scan with contrast to exclude pulmonary embolism.  We will make sure creatinine is stable tomorrow before proceeding with  that.  We will try and optimize her blood pressure control to help in  treatment of her diastolic dysfunction and will continue diuresis.   Bruce Elvera Lennox Juanda Chance, MD, [facility]  Electronically Signed   Current Medications (verified): 1)  Klor-Con M20 20 Meq  Tbcr (Potassium Chloride Crys Cr) .Marland Kitchen.. 1 Tab Three Times A Day 2)  Isosorbide Dinitrate 30 Mg  Tabs (Isosorbide Dinitrate) .... Take 1 By Mouth Two Times A Day 3)  Advair Diskus 100-50 Mcg/dose  Misc (Fluticasone-Salmeterol) .Marland Kitchen.. 1 Puff Two Times A Day\par 4)  Lasix 80 Mg  Tabs (Furosemide) .Marland Kitchen.. 1 1/2 Tab Two Times A Day 5)  Tramadol Hcl 50 Mg  Tabs (Tramadol Hcl) .... Take 1/2 Q 4 Hours Prn  6)  Novolin 70/30 70-30 %  Susp (Insulin Isophane & Regular) .... 220 Units Each Am 7)  Metolazone 5 Mg  Tabs (Metolazone) .Marland Kitchen.. 1 Tab Two Times A Day 8)  Crestor 40 Mg Tabs (Rosuvastatin Calcium) .... Once Daily 9)  Hydrocodone-Acetaminophen 5-325 Mg Tabs (Hydrocodone-Acetaminophen) .... Take 1-2 By Mouth Q 6 Hours Prn 10)  Hydralazine Hcl 25 Mg Tabs (Hydralazine Hcl) .... Three Times A Day 11)  Freestyle Test  Strp (Glucose Blood) .... 2x A Day, and Lancets, 250.01 12)  Labetalol Hcl 200 Mg Tabs (Labetalol Hcl) .... Take Two Tablets By Mouth Twice A Day 13)  Zetia 10 Mg Tabs (Ezetimibe) .Marland Kitchen.. 1 Once Daily 14)  Aspirin 325 Mg Tabs (Aspirin) .Marland Kitchen.. 1 Once  Daily 15)  Cpap Machine .... At Bedtime  Allergies: 1)  ! Norvasc 2)  ! * Iv Dye  Past History:  Past Medical History: Last updated: 04/05/2009 Congestive heart failure - diastolic non-ischemic cardiomyopathy Coronary artery disease - minimal Diabetes mellitus, type II GERD Hypertension Peripheral vascular disease Obese Dyslipidemia Zoster DM Peripheral Neuropathy Osteoarthritis spine - cervical Non-compliance DM Retinopathy Mild asthma Sleep Apnea -iwith CPAP Idiopathic Pruritis ASCVD on Mri Brain Hyperlipidemia hx of shingles Peripheral neuropathy COPD Asthma uterine cancer Depression Peptic ulcer disease - duodenal Peptic stricture Hiatal hernia Diverticulosis Arthritis Congestive Heart Failure Urinary Tract Infection  Past Surgical History: Last updated: 04/05/2009 Tubal ligation (1967) (L) Knee Arthroscopy (10/1998) (L) Craniotomy for SDH (1997) DEXA (12/2003) Bilateral cataracts (2005) EGD (04/04/2004) Hysterectomy c-spine and lumbar surgury cholecystectomy 2010 Heart Caths  Family History: Last updated: 04/05/2009 father with pacemaker, lymphoma mother with "stomach" cancer asthma rheumatism: father ,mother  Family History of Diabetes: mother Family History of Heart Disease: mother Family History of Kidney Disease: PGM  Social History: Last updated: 04/05/2009 Alcohol use-yes - rare Married retired 7 children Patient never smoked.  Illicit Drug Use - no  Risk Factors: Smoking Status: never (10/12/2008)  Review of Systems       negative other than history of present illness  Vital Signs:  Patient profile:   77 year old female Height:      66 inches Weight:      210.8 pounds BMI:     34.15 Pulse rate:   72 / minute Pulse rhythm:   regular BP sitting:   120 / 72  (left arm) Cuff size:   large  Vitals Entered By: Danielle Rankin, CMA (February 07, 2010 9:19 AM)  Physical Exam  General:  obese.   Head:  normocephalic and  atraumatic Eyes:  PERRLA/EOM intact; conjunctiva and lids normal. Neck:  Neck supple, no JVD. No masses, thyromegaly or abnormal cervical nodes. Lungs:  Clear bilaterally to auscultation and percussion. Heart:  PMI difficult to appreciate, normal S1-S2, no gallop, carotids full without bruits Msk:  decreased ROM.   Pulses:  diminished but present in the lower extremities Extremities:  trace left pedal edema and trace right pedal edema.   Neurologic:  Alert and oriented x 3. Skin:  Intact without lesions or rashes. Psych:  Normal affect.   Impression & Recommendations:  Problem # 1:  DIASTOLIC HEART FAILURE, ACUTE ON CHRONIC (ICD-428.33) Assessment Unchanged  Her swelling has resolved and her weight has dropped 4 pounds with the increased Lasix and we called in. We will map out a program for her to automatically titrate her Lasix was swelling or weight gain of greater than 3 pounds. Her updated medication list for this problem includes:  Isosorbide Dinitrate 30 Mg Tabs (Isosorbide dinitrate) .Marland Kitchen... Take 1 by mouth two times a day    Lasix 80 Mg Tabs (Furosemide) .Marland Kitchen... 1 1/2 tab two times a day    Metolazone 5 Mg Tabs (Metolazone) .Marland Kitchen... 1 tab two times a day    Labetalol Hcl 200 Mg Tabs (Labetalol hcl) .Marland Kitchen... Take two tablets by mouth twice a day    Aspirin 325 Mg Tabs (Aspirin) .Marland Kitchen... 1 once daily  Orders: TLB-BMP (Basic Metabolic Panel-BMET) (80048-METABOL) TLB-Magnesium (Mg) (83735-MG)  Problem # 2:  CAROTID BRUIT, LEFT (ICD-785.9) Assessment: Unchanged  Problem # 3:  RENAL INSUFFICIENCY (ICD-588.9)  Problem # 4:  LEG CRAMPS (ICD-729.82) Assessment: Deteriorated  I will check potassium and magnesium.  Orders: TLB-BMP (Basic Metabolic Panel-BMET) (80048-METABOL)  Problem # 5:  EDEMA (ICD-782.3) Assessment: Improved  Problem # 6:  OBSTRUCTIVE SLEEP APNEA (ICD-327.23) Assessment: Unchanged  Problem # 7:  * ELEVATED D-DIMER, NEG V/Q SCAN Assessment: Unchanged  Problem  # 8:  HYPERLIPIDEMIA (ICD-272.4)  Her updated medication list for this problem includes:    Crestor 40 Mg Tabs (Rosuvastatin calcium) ..... Once daily    Zetia 10 Mg Tabs (Ezetimibe) .Marland Kitchen... 1 once daily  Her updated medication list for this problem includes:    Crestor 40 Mg Tabs (Rosuvastatin calcium) ..... Once daily    Zetia 10 Mg Tabs (Ezetimibe) .Marland Kitchen... 1 once daily  Problem # 9:  CORONARY ARTERY DISEASE (ICD-414.00) Assessment: Unchanged  Her updated medication list for this problem includes:    Isosorbide Dinitrate 30 Mg Tabs (Isosorbide dinitrate) .Marland Kitchen... Take 1 by mouth two times a day    Labetalol Hcl 200 Mg Tabs (Labetalol hcl) .Marland Kitchen... Take two tablets by mouth twice a day    Aspirin 325 Mg Tabs (Aspirin) .Marland Kitchen... 1 once daily  Her updated medication list for this problem includes:    Isosorbide Dinitrate 30 Mg Tabs (Isosorbide dinitrate) .Marland Kitchen... Take 1 by mouth two times a day    Labetalol Hcl 200 Mg Tabs (Labetalol hcl) .Marland Kitchen... Take two tablets by mouth twice a day    Aspirin 325 Mg Tabs (Aspirin) .Marland Kitchen... 1 once daily  Problem # 10:  PERIPHERAL VASCULAR DISEASE (ICD-443.9) Assessment: Unchanged  Problem # 11:  DIABETES MELLITUS, TYPE II (ICD-250.00) Assessment: Unchanged  Her updated medication list for this problem includes:    Novolin 70/30 70-30 % Susp (Insulin isophane & regular) .Marland Kitchen... 220 units each am    Aspirin 325 Mg Tabs (Aspirin) .Marland Kitchen... 1 once daily  Patient Instructions: 1)  Your physician recommends that you schedule a follow-up appointment in: 3 months with Dr. Daleen Squibb 2)  Your physician recommends that you continue on your current medications as directed. Please refer to the Current Medication list given to you today. 3)  When your weight gain is up 3 pounds or more and/or your legs are swelling... Take 2 of your 80mg  Furosemide 2 times a day for 2 days. Then resume your previous dose and to monitor you weight. 4)  Your physician recommends that you weigh, daily, at the  same time every day, and in the same amount of clothing.  Please record your daily weights on the handout provided and bring it to your next appointment.

## 2010-07-19 NOTE — Assessment & Plan Note (Signed)
Summary: diarrhea/lmb   Vital Signs:  Patient profile:   77 year old female Height:      66 inches (167.64 cm) Weight:      210.4 pounds (95.64 kg) O2 Sat:      96 % on Room air Temp:     97.4 degrees F (36.33 degrees C) oral Pulse rate:   75 / minute BP sitting:   146 / 70  (left arm) Cuff size:   large  Vitals Entered By: Orlan Leavens (Oct 16, 2009 11:29 AM)  O2 Flow:  Room air CC: Ongoing diarrhea x's 3 weeks Is Patient Diabetic? Yes Did you bring your meter with you today? No Pain Assessment Patient in pain? no        Referring Provider:  Re- Establish/ Former Pt Primary Provider:  Romero Belling, MD  CC:  Ongoing diarrhea x's 3 weeks.  History of Present Illness: pt states 2 weeks of slight abdominal pain, and associated intermittent diarrhea and n/v. no cbg record, but states cbg's are well-controlled, except slightly low in the middle of the night.  Current Medications (verified): 1)  Klor-Con M20 20 Meq  Tbcr (Potassium Chloride Crys Cr) .Marland Kitchen.. 1 Tab Three Times A Day 2)  Isosorbide Dinitrate 30 Mg  Tabs (Isosorbide Dinitrate) .... Take 1 By Mouth Bid 3)  Advair Diskus 100-50 Mcg/dose  Misc (Fluticasone-Salmeterol) .Marland Kitchen.. 1 Puff Bid 4)  Aspirin 325 Mg  Tbec (Aspirin) .... Take 1 By Mouth Qd 5)  Lasix 80 Mg  Tabs (Furosemide) .Marland Kitchen.. 1 1/2 Tab Two Times A Day 6)  Tramadol Hcl 50 Mg  Tabs (Tramadol Hcl) .... Take 1/2 Q 4 Hours Prn 7)  Promethazine Hcl 25 Mg  Tabs (Promethazine Hcl) .... Take 1 By Mouth Prn 8)  Novolin 70/30 70-30 %  Susp (Insulin Isophane & Regular) .Marland KitchenMarland KitchenMarland Kitchen 190 Units Q.a.m., 10 Units Q.p.m. 9)  Metolazone 5 Mg  Tabs (Metolazone) .... Qd 10)  Crestor 40 Mg Tabs (Rosuvastatin Calcium) .... Qd 11)  Hydrocodone-Acetaminophen 5-325 Mg Tabs (Hydrocodone-Acetaminophen) .... Take 1-2 By Mouth Q 6 Hours Prn 12)  Hydralazine Hcl 25 Mg Tabs (Hydralazine Hcl) .... Tid 13)  Freestyle Test  Strp (Glucose Blood) .... Qid, and Lancets, 250.01, Variable Glucoses 14)   Labetalol Hcl 200 Mg Tabs (Labetalol Hcl) .... Take Two Tablets By Mouth Twice A Day 15)  Zetia 10 Mg Tabs (Ezetimibe) .Marland Kitchen.. 1 Qd  Allergies (verified): 1)  ! Norvasc 2)  ! 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         low-grade fever is resolved.  Physical Exam  General:  obese.  no distress  Abdomen:  abdomen is soft.  there is minimal diffuse tenderness.  no hepatosplenomegaly.   not distended.  no hernia  Additional Exam:  Sodium                    142 mEq/L  135-145   Potassium                 4.5 mEq/L                   3.5-5.1   Chloride                  104 mEq/L                   96-112   Carbon Dioxide            29 mEq/L                    19-32   Glucose              [H]  216 mg/dL                   04-54   BUN                       19 mg/dL                    0-98   Creatinine           [H]  1.3 mg/dL                   1.1-9.1   Calcium                   8.7 mg/dL                   4.7-82.9    B-Type Natriuetic Peptide    [H]  535.0 pg/mL                 0.0-100.0 Hemoglobin A1C       [H]  9.1 %   Impression & Recommendations:  Problem # 1:  ABDOMINAL PAIN (ICD-789.00) prob due to a viral gastroenteritis  Problem # 2:  DIABETES  MELLITUS, TYPE II (ICD-250.00) therapy limited by noncompliance with cbg's.  i'll do the best i can.  Problem # 3:  DIASTOLIC HEART FAILURE, ACUTE ON CHRONIC (ICD-428.33) despite her diarrhea, these labs do not indicate hypovolemia  Medications Added to Medication List This Visit: 1)  Novolin 70/30 70-30 % Susp (Insulin isophane & regular) .Marland KitchenMarland KitchenMarland Kitchen 190 units each am 2)  Ondansetron 4 Mg Tbdp (Ondansetron) .Marland Kitchen.. 1 every 4 hrs as needed for nausea  Other Orders: TLB-CBC Platelet - w/Differential (85025-CBCD) TLB-BMP (Basic Metabolic Panel-BMET) (80048-METABOL) TLB-BNP (B-Natriuretic Peptide) (83880-BNPR) TLB-A1C / Hgb A1C (Glycohemoglobin) (83036-A1C) TLB-Udip w/ Micro (81001-URINE) TLB-Hepatic/Liver Function Pnl (80076-HEPATIC) TLB-Amylase (82150-AMYL) Est. Patient Level IV (56213)  Patient Instructions: 1)  tests are being ordered for you today.  a few days after the test(s), please call [phone] to hear your test results. 2)  pending the test results, please decrease novolin 70/30 insulin to 190 units am and none in the pm. 3)  ondansetron 4 mg every 4 hrs as needed for nausea. 4)  Please schedule a follow-up appointment in 3 months. 5)  skip 2 days of furosemide, then resume. 6)  call next week if not better. 7)  (update: i left message on phone-tree:  you do not need to skip lasix. return next week.) Prescriptions: ONDANSETRON 4 MG TBDP (ONDANSETRON) 1 every 4 hrs as needed for nausea  #36 x 1   Entered and Authorized by:   Cleophas Dunker  Everardo All MD   Signed by:   Minus Breeding MD on 10/16/2009   Method used:   Electronically to        [facility]* (retail)       [address]       [place], [place]  [address]       Ph: [phone]       Fax: [phone]   RxID:   [id number]

## 2010-07-19 NOTE — Assessment & Plan Note (Signed)
Summary: [facility] PT/ BS IS 465/ NUMBNESS IN LEGS/ PER DAHLIA/NWS   Vital Signs:  Patient profile:   77 year old female Height:      65 inches Weight:      214.25 pounds BMI:     35.78 O2 Sat:      96 % on Room air Temp:     98 degrees F oral Pulse rate:   73 / minute BP sitting:   150 / 72  (left arm) Cuff size:   large  Vitals Entered By: Zella Ball Ewing CMA (AAMA) (April 02, 2010 2:04 PM)  O2 Flow:  Room air CC: BS Elevated, urinating more frequently/RE   Primary Care Provider:  Romero Belling, MD  CC:  BS Elevated and urinating more frequently/RE.  History of Present Illness: pt of dr Everardo All, with hx of confusion about meds, variable complaince  and DM on large dose insulint who presents with c/o marked incresae in symptoms of polydipsia, polyuria, resolution abruply of usual LE swelling, difficulty sleeping at night for several nights due to ongoing back pain and worse nocturia that seemed to start after her most recnet ESI to the lumbar region last wk;  Pt denies CP, worsening sob, doe, wheezing, orthopnea, pnd, worsening LE edema, palps,  or syncope but has had some dizziness  Pt denies other new neuro symptoms such as headache, facial or extremity weakness  But CBG's have been  varialbe, and as much 500 but most often in the 200's.  Checks her sugars qid per pt;  most recent cbg 271 this am.  States she is taking the 220 units 70/30 as is on her med list, as well as humulin 75/25 in sliding scale (I have briefly conferred with Dr Everardo All at the time of her visit who confirms that this is not on her current med list and she should not be taking this).  Has also ongoing LE numbness which is chronic per pt, seems ? worse with the elevated BS's but not assoc with increaed LE pain, weakness, increaed gait problem or falls.  Specifically denies fever , hematuria, flank pain, n/v, chills. No bowel change or worsening radicular symptoms  Problems Prior to Update: 1)  Polyuria  (ICD-788.42) 2)   Leg Cramps  (ICD-729.82) 3)  Hypokalemia  (ICD-276.8) 4)  Asthma, With Acute Exacerbation  (ICD-493.92) 5)  Uri  (ICD-465.9) 6)  Abdominal Pain  (ICD-789.00) 7)  Diastolic Heart Failure, Acute On Chronic  (ICD-428.33) 8)  Proteinuria  (ICD-791.0) 9)  Carotid Bruit, Left  (ICD-785.9) 10)  Encounter For Long-term Use of Other Medications  (ICD-V58.69) 11)  Weight Loss-abnormal  (ICD-783.21) 12)  Dysphagia  (ICD-787.29) 13)  Fm Hx Malignant Neoplasm Gastrointestinal Tract  (ICD-V16.0) 14)  Obstructive Sleep Apnea  (ICD-327.23) 15)  Pers Hx Noncompliance W/med Tx Prs [facility]  (ICD-V15.81) 16)  Routine General Medical Exam@health  Care Fac[facility]  (ICD-V70.0) 17)  Renal Insufficiency  (ICD-588.9) 18)  Fever Unspecified  (ICD-780.60) 19)  Abdominal Pain, Right Upper Quadrant  (ICD-789.01) 20)  Osteoporosis  (ICD-733.00) 21)  Foot Pain, Bilateral  (ICD-729.5) 22)  Edema  (ICD-782.3) 23)  Elevated D-dimer, Neg V/q Scan  () 24)  Dyspnea  (ICD-786.05) 25)  Peptic Ulcer Disease  (ICD-533.90) 26)  Depression  (ICD-311) 27)  Asthma  (ICD-493.90) 28)  COPD  (ICD-496) 29)  Peripheral Neuropathy  (ICD-356.9) 30)  Hyperlipidemia  (ICD-272.4) 31)  Pneumonia  (ICD-486) 32)  Osteoarthritis  (ICD-715.90) 33)  Peripheral Vascular Disease  (ICD-443.9) 34)  Hypertension  (ICD-401.9) 35)  Gerd  (ICD-530.81) 36)  Diabetes Mellitus, Type II  (ICD-250.00) 37)  Coronary Artery Disease  (ICD-414.00)  Medications Prior to Update: 1)  Klor-Con M20 20 Meq  Tbcr (Potassium Chloride Crys Cr) .Marland Kitchen.. 1 Tab Three Times A Day 2)  Isosorbide Dinitrate 30 Mg  Tabs (Isosorbide Dinitrate) .... Take 1 By Mouth Two Times A Day 3)  Advair Diskus 100-50 Mcg/dose  Misc (Fluticasone-Salmeterol) .Marland Kitchen.. 1 Puff Two Times A Day\par 4)  Lasix 80 Mg  Tabs (Furosemide) .Marland Kitchen.. 1 1/2 Tab Two Times A Day 5)  Novolin 70/30 70-30 %  Susp (Insulin Isophane & Regular) .... 220 Units Each Am 6)  Metolazone 5 Mg  Tabs (Metolazone) .Marland Kitchen.. 1 Tab Two  Times A Day 7)  Crestor 40 Mg Tabs (Rosuvastatin Calcium) .... Once Daily 8)  Hydrocodone-Acetaminophen 5-325 Mg Tabs (Hydrocodone-Acetaminophen) .... Take 1-2 By Mouth Q 6 Hours Prn 9)  Hydralazine Hcl 25 Mg Tabs (Hydralazine Hcl) .... Three Times A Day 10)  Freestyle Test  Strp (Glucose Blood) .... 2x A Day, and Lancets, 250.01 11)  Labetalol Hcl 200 Mg Tabs (Labetalol Hcl) .... Take Two Tablets By Mouth Twice A Day 12)  Zetia 10 Mg Tabs (Ezetimibe) .Marland Kitchen.. 1 Once Daily 13)  Aspirin 325 Mg Tabs (Aspirin) .Marland Kitchen.. 1 Once Daily 14)  Cpap Machine .... At Bedtime 15)  Hydrocodone-Acetaminophen 10-325 Mg Tabs (Hydrocodone-Acetaminophen) .... 1/2 or 1 Pill Every 4 Hrs As Needed For Pain  Current Medications (verified): 1)  Klor-Con M20 20 Meq  Tbcr (Potassium Chloride Crys Cr) .Marland Kitchen.. 1 Tab Three Times A Day 2)  Isosorbide Dinitrate 30 Mg  Tabs (Isosorbide Dinitrate) .... Take 1 By Mouth Two Times A Day 3)  Advair Diskus 100-50 Mcg/dose  Misc (Fluticasone-Salmeterol) .Marland Kitchen.. 1 Puff Two Times A Day\par 4)  Lasix 80 Mg  Tabs (Furosemide) .Marland Kitchen.. 1 1/2 Tab Two Times A Day 5)  Novolin 70/30 70-30 %  Susp (Insulin Isophane & Regular) .... 240 Units Each Am 6)  Metolazone 5 Mg  Tabs (Metolazone) .Marland Kitchen.. 1 Tab Two Times A Day 7)  Crestor 40 Mg Tabs (Rosuvastatin Calcium) .... Once Daily 8)  Hydrocodone-Acetaminophen 5-325 Mg Tabs (Hydrocodone-Acetaminophen) .... Take 1-2 By Mouth Q 6 Hours Prn 9)  Hydralazine Hcl 25 Mg Tabs (Hydralazine Hcl) .... Three Times A Day 10)  Freestyle Test  Strp (Glucose Blood) .... 2x A Day, and Lancets, 250.01 11)  Labetalol Hcl 200 Mg Tabs (Labetalol Hcl) .... Take Two Tablets By Mouth Twice A Day 12)  Zetia 10 Mg Tabs (Ezetimibe) .Marland Kitchen.. 1 Once Daily 13)  Aspirin 325 Mg Tabs (Aspirin) .Marland Kitchen.. 1 Once Daily 14)  Cpap Machine .... At Bedtime 15)  Hydrocodone-Acetaminophen 10-325 Mg Tabs (Hydrocodone-Acetaminophen) .... 1/2 or 1 Pill Every 4 Hrs As Needed For Pain  Allergies (verified): 1)  !  Norvasc 2)  ! * Iv Dye  Past History:  Social History: Last updated: 04/05/2009 Alcohol use-yes - rare Married retired 7 children Patient never smoked.  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 Tubal ligation (1967) (L) Knee Arthroscopy (10/1998) (L) Craniotomy for SDH (1997) DEXA (12/2003) Bilateral cataracts (2005) EGD (04/04/2004) Hysterectomy c-spine and lumbar surgury cholecystectomy 2010 Heart Caths  Review of Systems       all otherwise negative per pt -    Physical Exam  General:  alert and overweight-appearing.  , nontoxic but unocmfortable, shifting sometimes in the chair Head:  normocephalic and atraumatic.   Eyes:  vision grossly intact, pupils equal, and pupils round.   Ears:  R ear normal and L ear normal.   Nose:  no external deformity and no nasal discharge.   Mouth:  no gingival abnormalities and pharynx pink and moist.   Neck:  supple and no JVD.   Lungs:  normal respiratory effort and normal breath sounds.   Heart:  normal rate and regular rhythm.   Abdomen:  soft, non-tender, and normal bowel sounds.   Msk:  no spine or paravertebral tender or swelling, rash;  no flank tender Extremities:  no edema, no erythema  Neurologic:  strength normal in all extremities  and DTRs symmetrical and normal.  , sensory not done in detail   Impression & Recommendations:  Problem # 1:  DIABETES MELLITUS, TYPE II (ICD-250.00)  Her updated medication list for this problem includes:    Novolin 70/30 70-30 % Susp (Insulin isophane & regular) .Marland Kitchen... 240 units each am    Aspirin 325 Mg Tabs (Aspirin) .Marland Kitchen... 1 once daily to incr the 70/30 to 240 units (after I consulted with Dr Lorane Gell today);  Continue all previous medications as before this visit , except no need to take other insulin (states she has also taken some 75/25); check labs today  Orders: TLB-BMP (Basic Metabolic Panel-BMET) (80048-METABOL) TLB-A1C / Hgb A1C (Glycohemoglobin) (83036-A1C)  Problem # 2:  POLYURIA (ICD-788.42)  most liekly due to above, but with elev sugars and recent back pain , to check UA  Orders: TLB-Udip w/ Micro (81001-URINE)  Problem # 3:  HYPERTENSION (ICD-401.9)  Her updated medication list for this problem includes:    Lasix 80 Mg Tabs (Furosemide) .Marland Kitchen... 1 1/2 tab two times a day    Metolazone 5 Mg Tabs (Metolazone) .Marland Kitchen... 1 tab two times a day    Hydralazine Hcl 25 Mg Tabs (Hydralazine hcl) .Marland Kitchen... Three times a day    Labetalol Hcl 200 Mg Tabs (Labetalol hcl) .Marland Kitchen... Take two tablets by mouth twice a day mild elev today, likely situational, ok to follow, continue same treatment   BP today: 150/72 Prior BP: 120/72 (02/07/2010)  Labs Reviewed: K+: 3.3 (02/07/2010) Creat: : 1.5 (02/07/2010)   Chol: 188 (05/18/2009)   HDL: 65.10 (05/18/2009)   LDL: 103 (05/18/2009)   TG: 102.0 (05/18/2009)  Complete Medication List: 1)  Klor-con M20 20 Meq Tbcr (Potassium chloride crys cr) .Marland Kitchen.. 1 tab three times a day 2)  Isosorbide Dinitrate 30 Mg Tabs (Isosorbide dinitrate) .... Take 1 by mouth two times a day 3)  Advair Diskus 100-50 Mcg/dose Misc (Fluticasone-salmeterol) .Marland Kitchen.. 1 puff two times a day\par 4)  Lasix 80 Mg Tabs (Furosemide) .Marland Kitchen.. 1 1/2 tab two times a day 5)  Novolin 70/30 70-30  % Susp (Insulin isophane & regular) .... 240 units each am 6)  Metolazone 5 Mg Tabs (Metolazone) .Marland Kitchen.. 1 tab two times a day 7)  Crestor 40 Mg Tabs (Rosuvastatin calcium) .... Once daily 8)  Hydrocodone-acetaminophen 5-325 Mg Tabs (Hydrocodone-acetaminophen) .... Take 1-2 by  mouth q 6 hours prn 9)  Hydralazine Hcl 25 Mg Tabs (Hydralazine hcl) .... Three times a day 10)  Freestyle Test Strp (Glucose blood) .... 2x a day, and lancets, 250.01 11)  Labetalol Hcl 200 Mg Tabs (Labetalol hcl) .... Take two tablets by mouth twice a day 12)  Zetia 10 Mg Tabs (Ezetimibe) .Marland Kitchen.. 1 once daily 13)  Aspirin 325 Mg Tabs (Aspirin) .Marland Kitchen.. 1 once daily 14)  Cpap Machine  .... At bedtime 15)  Hydrocodone-acetaminophen 10-325 Mg Tabs (Hydrocodone-acetaminophen) .... 1/2 or 1 pill every 4 hrs as needed for pain  Patient Instructions: 1)  please increase the 70/30 insulin to 240 units in the am 2)  No need to take other insulin at this time 3)  Please go to the Lab in the basement for your blood and/or urine tests today 4)  Please schedule a follow-up appointment in 7-10 days with Dr Everardo All   Orders Added: 1)  TLB-BMP (Basic Metabolic Panel-BMET) [80048-METABOL] 2)  TLB-A1C / Hgb A1C (Glycohemoglobin) [83036-A1C] 3)  TLB-Udip w/ Micro [81001-URINE] 4)  Est. Patient Level IV [16109]

## 2010-07-19 NOTE — Miscellaneous (Signed)
Summary: Controlled Substances Contract/[facility]  Controlled Substances Contract/[facility]   Imported By: Sherian Rein 05/09/2010 12:08:06  _____________________________________________________________________  External Attachment:    Type:   Image     Comment:   External Document

## 2010-07-19 NOTE — Assessment & Plan Note (Signed)
Summary: PER CHECK OUT/SF    Visit Type:  1 mo f/u Referring Provider:  Re- Establish/ Former Pt Primary Provider:  Romero Belling, MD  CC:  pt c/o RUQ pain under right breast...sob...left ankle edema.  History of Present Illness: Mrs Sherrard tarsi for close evaluation and management of her chronic diastolic heart failure. On last visit her weight was down 4 pounds. Today is down additional 4 pounds. Her edema and shortness of breath have improved. She wonders if she still needs Avapro. She has not been on the last 2 visits and her pressures are good. We will remove this from her med list.  Current Medications (verified): 1)  Klor-Con M20 20 Meq  Tbcr (Potassium Chloride Crys Cr) .Marland Kitchen.. 1 Tab Three Times A Day 2)  Isosorbide Dinitrate 30 Mg  Tabs (Isosorbide Dinitrate) .... Take 1 By Mouth Bid 3)  Advair Diskus 100-50 Mcg/dose  Misc (Fluticasone-Salmeterol) .Marland Kitchen.. 1 Puff Bid 4)  Aspirin 325 Mg  Tbec (Aspirin) .... Take 1 By Mouth Qd 5)  Lasix 80 Mg  Tabs (Furosemide) .Marland Kitchen.. 1 1/2 Tab Two Times A Day 6)  Tramadol Hcl 50 Mg  Tabs (Tramadol Hcl) .... Take 1/2 Q 4 Hours Prn 7)  Promethazine Hcl 25 Mg  Tabs (Promethazine Hcl) .... Take 1 By Mouth Prn 8)  Novolin 70/30 70-30 %  Susp (Insulin Isophane & Regular) .Marland KitchenMarland KitchenMarland Kitchen 190 Units Q.a.m., 10 Units Q.p.m. 9)  Metolazone 5 Mg  Tabs (Metolazone) .... Qd 10)  Crestor 40 Mg Tabs (Rosuvastatin Calcium) .... Qd 11)  Hydrocodone-Acetaminophen 5-325 Mg Tabs (Hydrocodone-Acetaminophen) .... Take 1-2 By Mouth Q 6 Hours Prn 12)  Hydralazine Hcl 25 Mg Tabs (Hydralazine Hcl) .... Tid 13)  Freestyle Test  Strp (Glucose Blood) .... Qid, and Lancets, 250.01, Variable Glucoses 14)  Labetalol Hcl 200 Mg Tabs (Labetalol Hcl) .... Take Two Tablets By Mouth Twice A Day 15)  Zetia 10 Mg Tabs (Ezetimibe) .Marland Kitchen.. 1 Qd  Allergies: 1)  ! Norvasc 2)  ! 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 Hyperlipidemia hx of shingles Peripheral neuropathy COPD Asthma uterine cancer Depression Peptic ulcer disease - duodenal Peptic stricture Hiatal hernia Diverticulosis Arthritis Congestive Heart Failure Urinary Tract Infection  Past Surgical History: Last updated: 04/05/2009 Tubal ligation (1967) (L) Knee Arthroscopy (10/1998) (L) Craniotomy for SDH (1997) DEXA (12/2003) Bilateral cataracts (2005) EGD (04/04/2004) Hysterectomy c-spine and lumbar surgury cholecystectomy 2010 Heart Caths  Family History: Last updated: 04/05/2009 father with pacemaker, lymphoma mother with "stomach" cancer asthma rheumatism: father ,mother  Family History of Diabetes: mother Family History of Heart Disease: mother Family History of Kidney Disease: PGM  Social History: Last updated: 04/05/2009 Alcohol use-yes - rare Married retired 7 children Patient never smoked.  Illicit Drug Use - no  Risk Factors: Smoking Status: never (10/12/2008)  Vital Signs:  Patient profile:   77 year old female Height:      66 inches Weight:      214 pounds BMI:     34.67 Pulse rate:   58 / minute Pulse rhythm:   irregular BP sitting:   116 / 62  (left arm) Cuff size:   large  Vitals Entered By: Danielle Rankin, CMA (July 20, 2009 10:17 AM)  Physical Exam  General:  obese.   Head:  normocephalic and atraumatic Eyes:  PERRLA/EOM intact; conjunctiva and lids normal. Neck:  Neck supple, no JVD. No masses, thyromegaly or abnormal cervical nodes. Lungs:  Clear bilaterally to auscultation and percussion. Heart:  Non-displaced PMI, chest non-tender; regular rate and rhythm, S1,  S2 without murmurs, rubs or gallops. Carotid upstroke normal, no bruit. Normal abdominal aortic size, no bruits. Femorals normal pulses, no bruits. Pedals normal pulses. No edema, no varicosities. Msk:  decreased ROM.   Pulses:  pulses normal in all 4 extremities Extremities:  trace left pedal edema and trace right pedal edema.   Neurologic:  Alert and oriented x 3. Skin:  Intact without lesions or rashes. Psych:  Normal affect.   EKG  Procedure date:  07/20/2009  Findings:      sinus bradycardia, left anterior fascicular block, ST segment depression with T wave inversion laterally, no significant change  Impression & Recommendations:  Problem # 1:  DIASTOLIC HEART FAILURE, ACUTE ON CHRONIC (ICD-428.33) Assessment Improved She is euvolemic. We will check electrolytes today. No changes in meds. Advise her not to go back on Avapro since her pressures are good. Her updated medication list for this problem includes:    Isosorbide Dinitrate 30 Mg Tabs (Isosorbide dinitrate) .Marland Kitchen... Take 1 by mouth bid    Aspirin 325 Mg Tbec (Aspirin) .Marland Kitchen... Take 1 by mouth qd    Lasix 80 Mg Tabs (Furosemide) .Marland Kitchen... 1 1/2 tab two times a day    Metolazone 5 Mg Tabs (Metolazone) ..... Qd    Labetalol Hcl 200 Mg Tabs (Labetalol hcl) .Marland Kitchen... Take two tablets by mouth twice a day  Orders: TLB-BMP (Basic Metabolic Panel-BMET) (80048-METABOL)  Other Orders: EKG w/ Interpretation (93000)  Patient Instructions: 1)  Your physician recommends that you schedule a follow-up appointment in: 3 MONTHS 2)  Your physician recommends that you return for lab work ZO:XWRUE BMET  3)  Your physician has recommended you make the following change in your medication: STOP AVAPRO NOT SEEN ON MED LIST TODAY

## 2010-07-19 NOTE — Assessment & Plan Note (Signed)
Summary: 2 MTH PHYSICAL  STC   Vital Signs:  Patient profile:   77 year old female Height:      65 inches (165.10 cm) Weight:      225.13 pounds (102.33 kg) BMI:     37.60 O2 Sat:      95 % on Room air Temp:     98.6 degrees F (37.00 degrees C) oral Pulse rate:   81 / minute BP sitting:   152 / 88  (left arm) Cuff size:   large  Vitals Entered By: Brenton Grills CMA Duncan Dull) (July 06, 2010 9:41 AM)  O2 Flow:  Room air CC: CPX/refill on Hydrocodone-APAP/aj Is Patient Diabetic? Yes Comments pt is due for pap per pt   Referring Provider:  Re- Establish/ Former Pt Primary Provider:  Romero Belling, MD  CC:  CPX/refill on Hydrocodone-APAP/aj.  History of Present Illness: pt states 1 week of prod-quality cough in the chest, and assoc doe.  she has occ chest pain, which ntg promptly relieves.  she says she gets a good diuretic effect from lasix and metolozone. no cbg record, but states cbg's are 70 in am, and 265 in the afternoon.  Current Medications (verified): 1)  Klor-Con M20 20 Meq  Tbcr (Potassium Chloride Crys Cr) .Marland Kitchen.. 1 Tab Three Times A Day 2)  Isosorbide Dinitrate 30 Mg  Tabs (Isosorbide Dinitrate) .... Take 1 By Mouth Two Times A Day 3)  Advair Diskus 100-50 Mcg/dose  Misc (Fluticasone-Salmeterol) .Marland Kitchen.. 1 Puff Two Times A Day\par 4)  Lasix 80 Mg  Tabs (Furosemide) .Marland Kitchen.. 1 1/2 Tab Two Times A Day 5)  Metolazone 5 Mg  Tabs (Metolazone) .Marland Kitchen.. 1 Tab Two Times A Day 6)  Crestor 40 Mg Tabs (Rosuvastatin Calcium) .... Once Daily 7)  Hydralazine Hcl 25 Mg Tabs (Hydralazine Hcl) .... Three Times A Day 8)  Freestyle Test  Strp (Glucose Blood) .... 2x A Day, and Lancets, 250.01 9)  Labetalol Hcl 200 Mg Tabs (Labetalol Hcl) .... Take Two Tablets By Mouth Twice A Day 10)  Zetia 10 Mg Tabs (Ezetimibe) .Marland Kitchen.. 1 Once Daily 11)  Aspirin 325 Mg Tabs (Aspirin) .Marland Kitchen.. 1 Once Daily 12)  Cpap Machine .... At Bedtime 13)  Hydrocodone-Acetaminophen 10-325 Mg Tabs (Hydrocodone-Acetaminophen) .... 1/2  or 1 Pill Every 4 Hrs As Needed For Pain 14)  Humulin N 100 Unit/ml Susp (Insulin Isophane Human) .Marland KitchenMarland KitchenMarland Kitchen 175 Units Am and 40 Units in The Evening 15)  Nitrostat 0.4 Mg Subl (Nitroglycerin) .Marland Kitchen.. 1 Tablet Under Tongue At Onset of Chest Pain; You May Repeat Every 5 Minutes For Up To 3 Doses.  Allergies (verified): 1)  ! Norvasc 2)  ! 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    Physical Exam  General:  normal appearance.   Lungs:  Clear to auscultation bilaterally. Normal respiratory effort.  Extremities:  trace right pedal edema and 1+ left pedal edema.   Additional Exam:  Hemoglobin A1C       [H]  10.4 %  Impression & Recommendations:  Problem # 1:  DIASTOLIC HEART FAILURE, ACUTE ON CHRONIC (ICD-428.33) Assessment Deteriorated  Problem # 2:  DIABETES MELLITUS, TYPE II (ICD-250.00) therapy chronically  limited by noncompliance with cbg's.  i'll do the best i can.  Problem # 3:  COUGH (ICD-786.2) possibly due to coexistant uri  Medications Added to Medication List This Visit: 1)  Humulin N 100 Unit/ml Susp (Insulin isophane human) .Marland KitchenMarland KitchenMarland Kitchen 185 units am and 30 units in the evening 2)  Azithromycin 500 Mg Tabs (Azithromycin) .Marland Kitchen.. 1 tab once daily 3)  Benzonatate 200 Mg Caps (Benzonatate) .Marland Kitchen.. 1 tab three times a day as needed for cough 4)   Tramadol Hcl 50 Mg Tabs (Tramadol hcl) .Marland Kitchen.. 1 every 4 hrs as needed for pain 5)  Metolazone 10 Mg Tabs (Metolazone) .Marland Kitchen.. 1 tab two times a day  Other Orders: T-2 View CXR (71020TC) TLB-BNP (B-Natriuretic Peptide) (83880-BNPR) TLB-BMP (Basic Metabolic Panel-BMET) (80048-METABOL) TLB-A1C / Hgb A1C (Glycohemoglobin) (83036-A1C) TLB-Lipid Panel (80061-LIPID) TLB-TSH (Thyroid Stimulating Hormone) (84443-TSH) TLB-Hepatic/Liver Function Pnl (80076-HEPATIC) TLB-CBC Platelet - w/Differential (85025-CBCD) TLB-B12, Serum-Total ONLY (78469-G29) Est. Patient Level IV (52841)  Patient Instructions: 1)  blood tests are being ordered for you today.  please call [phone] to hear your test results. 2)  pending the test results, please change insulin to 185 units am and 30 units in the evening. 3)  take azithromycin 500 mg once daily 4)  benzonatate 200 mg three times a day as needed for cough.   5)  Please schedule a physical appointment in 1 week. 6)  (update: i left message on phone-tree:  increase metolazone to 10 mg two times a day) Prescriptions: METOLAZONE 10 MG TABS (METOLAZONE) 1 tab two times a day  #60 x 11   Entered and Authorized by:   Minus Breeding MD   Signed by:   Minus Breeding MD on 07/06/2010   Method used:   Electronically to        [facility] (retail)       [address]       [place], [place]  [address]       Ph: [phone]       Fax: [phone]   RxID:   [id number] TRAMADOL HCL 50 MG TABS (TRAMADOL HCL) 1 every 4 hrs as needed for pain  #50 x 11   Entered and Authorized by:   Minus Breeding MD   Signed by:   Minus Breeding MD on 07/06/2010   Method used:   Electronically to        [facility] (retail)       [address]       [place], [place]  [address]       Ph: [phone]       Fax: [phone]   RxID:   [id number] BENZONATATE 200 MG CAPS (BENZONATATE) 1 tab three times a day as needed for cough  #30 x 1   Entered and Authorized by:   Minus Breeding MD    Signed by:   Minus Breeding MD on 07/06/2010   Method used:   Electronically to        [facility] (retail)       [address]       [place], [place]  [address]       Ph: [phone]       Fax: [phone]   RxID:   [id number] AZITHROMYCIN 500 MG TABS (AZITHROMYCIN) 1 tab once daily  #6  x 0   Entered and Authorized by:   Minus Breeding MD   Signed by:   Minus Breeding MD on 07/06/2010   Method used:   Electronically to        [facility] (retail)       [address]       [place], [place]  [address]       Ph: [phone]       Fax: [phone]   RxID:   [id number]    Orders Added: 1)  T-2 View CXR [71020TC] 2)  TLB-BNP (B-Natriuretic Peptide) [83880-BNPR] 3)  TLB-BMP (Basic Metabolic Panel-BMET) [80048-METABOL] 4)  TLB-A1C / Hgb A1C (Glycohemoglobin) [83036-A1C] 5)  TLB-Lipid Panel [80061-LIPID] 6)  TLB-TSH (Thyroid Stimulating Hormone) [84443-TSH] 7)  TLB-Hepatic/Liver Function Pnl [80076-HEPATIC] 8)  TLB-CBC Platelet - w/Differential [85025-CBCD] 9)  TLB-B12, Serum-Total ONLY [82607-B12] 10)  Est. Patient Level IV [40102]   Not Administered:    Influenza Vaccine # 1 not given due to: patient condition   Preventive Care Screening  Mammogram:    Date:  05/17/2010    Results:  normal

## 2010-07-19 NOTE — Letter (Signed)
Summary: [facility]  [facility]   Imported By: Lester Napoleon 11/21/2009 11:37:23  _____________________________________________________________________  External Attachment:    Type:   Image     Comment:   External Document

## 2010-07-19 NOTE — Assessment & Plan Note (Signed)
Summary: follow up per lab results-lb   Vital Signs:  Patient profile:   77 year old female Height:      66 inches (167.64 cm) Weight:      215.38 pounds (97.90 kg) O2 Sat:      97 % on Room air Temp:     97.5 degrees F (36.39 degrees C) oral Pulse rate:   92 / minute BP sitting:   142 / 82  (left arm) Cuff size:   large  Vitals Entered By: Josph Macho RMA (Oct 24, 2009 8:37 AM)  O2 Flow:  Room air CC: FOllow-up visit on labs/ CF Is Patient Diabetic? Yes   Referring Provider:  Re- Establish/ Former Pt Primary Provider:  Romero Belling, MD  CC:  FOllow-up visit on labs/ CF.  History of Present Illness: the status of at least 3 ongoing medical problems is addressed today: diarrhea is resolved.  she feels better in general.   she still has doe. no cbg record, but states cbg's vary from 90-300.  it is in general higher later in the day.  she says the cost of a medication is very important to her.  no hypoglycemia.  Current Medications (verified): 1)  Klor-Con M20 20 Meq  Tbcr (Potassium Chloride Crys Cr) .Marland Kitchen.. 1 Tab Three Times A Day 2)  Isosorbide Dinitrate 30 Mg  Tabs (Isosorbide Dinitrate) .... Take 1 By Mouth Bid 3)  Advair Diskus 100-50 Mcg/dose  Misc (Fluticasone-Salmeterol) .Marland Kitchen.. 1 Puff Bid 4)  Aspirin 325 Mg  Tbec (Aspirin) .... Take 1 By Mouth Qd 5)  Lasix 80 Mg  Tabs (Furosemide) .Marland Kitchen.. 1 1/2 Tab Two Times A Day 6)  Tramadol Hcl 50 Mg  Tabs (Tramadol Hcl) .... Take 1/2 Q 4 Hours Prn 7)  Novolin 70/30 70-30 %  Susp (Insulin Isophane & Regular) .Marland KitchenMarland KitchenMarland Kitchen 190 Units Each Am 8)  Metolazone 5 Mg  Tabs (Metolazone) .... Qd 9)  Crestor 40 Mg Tabs (Rosuvastatin Calcium) .... Qd 10)  Hydrocodone-Acetaminophen 5-325 Mg Tabs (Hydrocodone-Acetaminophen) .... Take 1-2 By Mouth Q 6 Hours Prn 11)  Hydralazine Hcl 25 Mg Tabs (Hydralazine Hcl) .... Tid 12)  Freestyle Test  Strp (Glucose Blood) .... Qid, and Lancets, 250.01, Variable Glucoses 13)  Labetalol Hcl 200 Mg Tabs (Labetalol Hcl)  .... Take Two Tablets By Mouth Twice A Day 14)  Zetia 10 Mg Tabs (Ezetimibe) .Marland Kitchen.. 1 Qd 15)  Ondansetron 4 Mg Tbdp (Ondansetron) .Marland Kitchen.. 1 Every 4 Hrs As Needed For Nausea  Allergies: 1)  ! Norvasc 2)  ! 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    Physical Exam  General:  obese.   Lungs:  Clear to auscultation bilaterally. Normal respiratory effort.  Extremities:  1+ right pedal edema and 1+ left pedal edema.     Impression & Recommendations:  Problem # 1:  DIASTOLIC HEART FAILURE, ACUTE ON CHRONIC (ICD-428.33) needs increased rx  Problem # 2:  DIABETES MELLITUS, TYPE II (ICD-250.00)  therapy limited by pt's request for least expensive meds.  Problem # 3:  diarrhea improved.  was probably viral.  Medications Added to Medication List This Visit: 1)  Isosorbide Dinitrate 30 Mg Tabs (Isosorbide dinitrate) .... Take 1 by mouth two times a day 2)  Advair Diskus 100-50 Mcg/dose Misc (Fluticasone-salmeterol) .Marland Kitchen.. 1 puff two times a day\par 3)  Novolin 70/30 70-30 % Susp (Insulin isophane & regular) .... 220 units each am 4)  Metolazone 5 Mg Tabs (Metolazone) .Marland Kitchen.. 1 tab two times a day 5)  Crestor 40 Mg Tabs (Rosuvastatin calcium) .... Once daily 6)  Hydralazine Hcl 25 Mg Tabs  (Hydralazine hcl) .... Three times a day 7)  Freestyle Test Strp (Glucose blood) .... 2x a day, and lancets, 250.01 8)  Zetia 10 Mg Tabs (Ezetimibe) .Marland Kitchen.. 1 once daily 9)  Aspirin 325 Mg Tabs (Aspirin) .Marland Kitchen.. 1 once daily  Other Orders: Cardiology Referral (Cardiology) Est. Patient Level IV (29528)  Patient Instructions: 1)  please go back to see dr wall.  you will be called with a day and time for an appointment. 2)  increase metolazone to 5 mg two times a day 3)  same furosemide. 4)  increase 70/30 insulin to 220 units each am. 5)  return 1-2 weeks. 6)  check your blood sugar 2 times a day.  vary the time of day when you check, between before the 3 meals, and at bedtime.  also check if you have symptoms of your blood sugar being too high or too low.  please keep a record of the readings and bring it to your next appointment here.  please call us sooner if you are having low blood sugar episodes. 7)  (correction:  pt saw dr wall last week). Prescriptions: NOVOLIN 70/30 70-30 %  SUSP (INSULIN ISOPHANE & REGULAR) 220 units each am  #8 vials x 11   Entered and Authorized by:   Minus Breeding MD   Signed by:   Minus Breeding MD on 10/24/2009   Method used:   Electronically to        [facility] (retail)       [address]       [place], [place]  [address]       Ph: [phone] or [phone]       Fax: [phone]   RxID:   [id number] METOLAZONE 5 MG  TABS (METOLAZONE) 1 tab two times a day  #60 x 11   Entered and Authorized by:   Minus Breeding MD   Signed by:   Minus Breeding MD on 10/24/2009   Method used:   Electronically to        [facility] (retail)       [address]       [place], [place]  [address]       Ph: [phone] or [phone]       Fax: [phone]   RxID:   [id number]

## 2010-07-19 NOTE — Assessment & Plan Note (Signed)
Summary: CRAMPS IN LEGS/NWS   Vital Signs:  Patient profile:   77 year old female Height:      66 inches (167.64 cm) Weight:      214.25 pounds (97.39 kg) BMI:     34.71 O2 Sat:      94 % on Room air Temp:     97.9 degrees F (36.61 degrees C) oral Pulse rate:   71 / minute BP sitting:   126 / 74  (left arm) Cuff size:   large  Vitals Entered By: Brenton Grills MA (January 22, 2010 2:09 PM)  O2 Flow:  Room air CC: cramps in legs and hands x 2 weeks/form/aj Is Patient Diabetic? Yes   Referring Provider:  Re- Establish/ Former Pt Primary Provider:  Romero Belling, MD  CC:  cramps in legs and hands x 2 weeks/form/aj.  History of Present Illness: pt states 2 weeks of moderate cramps of the thighs and legs, worse with rest than ambulation.  no associated numbness.   no cbg record, but states cbg's are well-controlled.  it is sometimes as low as the 50's in the early hrs of the am.    Current Medications (verified): 1)  Klor-Con M20 20 Meq  Tbcr (Potassium Chloride Crys Cr) .Marland Kitchen.. 1 Tab Three Times A Day 2)  Isosorbide Dinitrate 30 Mg  Tabs (Isosorbide Dinitrate) .... Take 1 By Mouth Two Times A Day 3)  Advair Diskus 100-50 Mcg/dose  Misc (Fluticasone-Salmeterol) .Marland Kitchen.. 1 Puff Two Times A Day\par 4)  Lasix 80 Mg  Tabs (Furosemide) .Marland Kitchen.. 1 1/2 Tab Two Times A Day 5)  Tramadol Hcl 50 Mg  Tabs (Tramadol Hcl) .... Take 1/2 Q 4 Hours Prn 6)  Novolin 70/30 70-30 %  Susp (Insulin Isophane & Regular) .... 220 Units Each Am 7)  Metolazone 5 Mg  Tabs (Metolazone) .Marland Kitchen.. 1 Tab Two Times A Day 8)  Crestor 40 Mg Tabs (Rosuvastatin Calcium) .... Once Daily 9)  Hydrocodone-Acetaminophen 5-325 Mg Tabs (Hydrocodone-Acetaminophen) .... Take 1-2 By Mouth Q 6 Hours Prn 10)  Hydralazine Hcl 25 Mg Tabs (Hydralazine Hcl) .... Three Times A Day 11)  Freestyle Test  Strp (Glucose Blood) .... 2x A Day, and Lancets, 250.01 12)  Labetalol Hcl 200 Mg Tabs (Labetalol Hcl) .... Take Two Tablets By Mouth Twice A Day 13)   Zetia 10 Mg Tabs (Ezetimibe) .Marland Kitchen.. 1 Once Daily 14)  Aspirin 325 Mg Tabs (Aspirin) .Marland Kitchen.. 1 Once Daily 15)  Prednisone 10 Mg Tabs (Prednisone) .... 3po Qd For 3days, Then 2po Qd For 3days, Then 1po Qd For 3days, Then Stop  Allergies (verified): 1)  ! Norvasc 2)  ! 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    Physical Exam  General:  obese.   Pulses:  dorsalis pedis intact bilat.  Extremities:  no deformity.  no ulcer on the feet.  feet are of normal color and temp.    trace right pedal edema and 1+ left pedal edema.  Neurologic:  sensation is intact to touch on the feet  Additional Exam:   Hemoglobin A1C       [H]  10.9 %        Impression & Recommendations:  Problem # 1:  DIABETES MELLITUS, TYPE II (ICD-250.00) therapy limited by noncompliance with cbg's.  i'll do the best i can.  Problem # 2:  cramps uncertain etiology  Other Orders: T-Parathyroid Hormone, Intact w/ Calcium ([address]-60454) TLB-BMP (Basic Metabolic Panel-BMET) (80048-METABOL) TLB-A1C / Hgb A1C (Glycohemoglobin) (83036-A1C) Est. Patient Level III (09811)  Patient Instructions: 1)  tests are being ordered for you today.  a few days after the test(s), please call [phone] to hear your test results. 2)  pending the test results,  please continue the same medications for now 3)  Please schedule a physical appointment in 4 months. 4)  (update: i left message on phone-tree:  carefully check cbg's to guide management)

## 2010-07-19 NOTE — Assessment & Plan Note (Signed)
Summary: EC3/FOLLOW UP PER DR JOHN/JSS   Vital Signs:  Patient profile:   77 year old female Height:      65 inches (165.10 cm) Weight:      215.13 pounds (97.79 kg) BMI:     35.93 O2 Sat:      91 % on Room air Temp:     98.1 degrees F (36.72 degrees C) oral Pulse rate:   87 / minute BP sitting:   122 / 84  (left arm) Cuff size:   large  Vitals Entered By: Brenton Grills MA (April 09, 2010 1:54 PM)  O2 Flow:  Room air CC: F/U on CBG/aj Is Patient Diabetic? Yes   Referring Provider:  Re- Establish/ Former Pt Primary Provider:  Romero Belling, MD  CC:  F/U on CBG/aj.  History of Present Illness: pt had a steroid injection into her back, approx 2 weeks ago.  she brings a record of her cbg's which i have reviewed today.  since her insulin was increased, it is often low after breakfast.  it is over 300 before breakfast.  after supper, it varies from 60-500.  pt says she is unable to account for these discrepancies.  she says the cost of a medication si the primary consideration when choosing an insulin.  Current Medications (verified): 1)  Klor-Con M20 20 Meq  Tbcr (Potassium Chloride Crys Cr) .Marland Kitchen.. 1 Tab Three Times A Day 2)  Isosorbide Dinitrate 30 Mg  Tabs (Isosorbide Dinitrate) .... Take 1 By Mouth Two Times A Day 3)  Advair Diskus 100-50 Mcg/dose  Misc (Fluticasone-Salmeterol) .Marland Kitchen.. 1 Puff Two Times A Day\par 4)  Lasix 80 Mg  Tabs (Furosemide) .Marland Kitchen.. 1 1/2 Tab Two Times A Day 5)  Novolin 70/30 70-30 %  Susp (Insulin Isophane & Regular) .... 240 Units Each Am 6)  Metolazone 5 Mg  Tabs (Metolazone) .Marland Kitchen.. 1 Tab Two Times A Day 7)  Crestor 40 Mg Tabs (Rosuvastatin Calcium) .... Once Daily 8)  Hydralazine Hcl 25 Mg Tabs (Hydralazine Hcl) .... Three Times A Day 9)  Freestyle Test  Strp (Glucose Blood) .... 2x A Day, and Lancets, 250.01 10)  Labetalol Hcl 200 Mg Tabs (Labetalol Hcl) .... Take Two Tablets By Mouth Twice A Day 11)  Zetia 10 Mg Tabs (Ezetimibe) .Marland Kitchen.. 1 Once Daily 12)   Aspirin 325 Mg Tabs (Aspirin) .Marland Kitchen.. 1 Once Daily 13)  Cpap Machine .... At Bedtime 14)  Hydrocodone-Acetaminophen 10-325 Mg Tabs (Hydrocodone-Acetaminophen) .... 1/2 or 1 Pill Every 4 Hrs As Needed For Pain 15)  Cephalexin 500 Mg Caps (Cephalexin) .Marland Kitchen.. 1 By Mouth Three Times A Day  Allergies (verified): 1)  ! Norvasc 2)  ! 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    Physical Exam  General:  obese.  no distress  Additional Exam:  Hemoglobin A1C       [H]  11.4 %    Impression & Recommendations:  Problem # 1:  DIABETES MELLITUS, TYPE II (ICD-250.00) therapy limited by cost factors.  she  has also requested a simpler regimen  Medications Added to Medication List This Visit: 1)  Humulin N 100 Unit/ml Susp (Insulin isophane human) .... 200 units am and 40 units in the evening  Other Orders: Est. Patient Level III (40981)  Patient Instructions: 1)  change current insulin to nph humulin:  200 units am and 40 units in the evening. 2)  check your blood sugar 2 times a day.  vary the time of day when you check, between before the 3 meals, and at bedtime.  also check if you have symptoms of your blood sugar being too high or too low.  please keep a record of the readings and bring it to your next appointment here.  please call us sooner if you are having  low blood sugar episodes. 3)  Please schedule a follow-up appointment in 2 weeks. Prescriptions: HUMULIN N 100 UNIT/ML SUSP (INSULIN ISOPHANE HUMAN) 200 units am and 40 units in the evening  #8 vials x 11   Entered and Authorized by:   Minus Breeding MD   Signed by:   Minus Breeding MD on 04/09/2010   Method used:   Electronically to        [facility] (retail)       [address]       [place], [place]  [address]       Ph: [phone]       Fax: [phone]   RxID:   [id number]    Orders Added: 1)  Est. Patient Level III [36644]

## 2010-07-19 NOTE — Assessment & Plan Note (Signed)
Summary: 1 mth fu---stc   Vital Signs:  Patient profile:   77 year old female Height:      66 inches (167.64 cm) Weight:      212.50 pounds (96.59 kg) BMI:     34.42 O2 Sat:      97 % on Room air Temp:     97.7 degrees F (36.50 degrees C) oral Pulse rate:   74 / minute Pulse rhythm:   regular BP sitting:   138 / 78  (left arm) Cuff size:   large  Vitals Entered By: Brenton Grills MA (December 08, 2009 9:25 AM)  O2 Flow:  Room air CC: 1 mo f/u/aj   Referring Provider:  Re- Establish/ Former Pt Primary Provider:  Romero Belling, MD  CC:  1 mo f/u/aj.  History of Present Illness: she brings a record of her cbg's which i have reviewed today.  it varies from 56 (am) to 400 (afternoon).  pt states she feels well in general, except for chronic low-back pain.      Current Medications (verified): 1)  Klor-Con M20 20 Meq  Tbcr (Potassium Chloride Crys Cr) .Marland Kitchen.. 1 Tab Three Times A Day 2)  Isosorbide Dinitrate 30 Mg  Tabs (Isosorbide Dinitrate) .... Take 1 By Mouth Two Times A Day 3)  Advair Diskus 100-50 Mcg/dose  Misc (Fluticasone-Salmeterol) .Marland Kitchen.. 1 Puff Two Times A Day\par 4)  Lasix 80 Mg  Tabs (Furosemide) .Marland Kitchen.. 1 1/2 Tab Two Times A Day 5)  Tramadol Hcl 50 Mg  Tabs (Tramadol Hcl) .... Take 1/2 Q 4 Hours Prn 6)  Novolin 70/30 70-30 %  Susp (Insulin Isophane & Regular) .... 220 Units Each Am 7)  Metolazone 5 Mg  Tabs (Metolazone) .Marland Kitchen.. 1 Tab Two Times A Day 8)  Crestor 40 Mg Tabs (Rosuvastatin Calcium) .... Once Daily 9)  Hydrocodone-Acetaminophen 5-325 Mg Tabs (Hydrocodone-Acetaminophen) .... Take 1-2 By Mouth Q 6 Hours Prn 10)  Hydralazine Hcl 25 Mg Tabs (Hydralazine Hcl) .... Three Times A Day 11)  Freestyle Test  Strp (Glucose Blood) .... 2x A Day, and Lancets, 250.01 12)  Labetalol Hcl 200 Mg Tabs (Labetalol Hcl) .... Take Two Tablets By Mouth Twice A Day 13)  Zetia 10 Mg Tabs (Ezetimibe) .Marland Kitchen.. 1 Once Daily 14)  Aspirin 325 Mg Tabs (Aspirin) .Marland Kitchen.. 1 Once Daily  Allergies  (verified): 1)  ! Norvasc 2)  ! 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    Physical Exam  General:  obese.  no distress  Pulses:  dorsalis pedis intact bilat.  Extremities:  no deformity.  no ulcer on the feet.  feet are of normal color and temp.   1+ right pedal edema and 1+ left pedal edema.   Neurologic:  sensation is intact to touch on the feet  Additional Exam:  fructosamine=361 (converts to a1c of 7.8)   Impression & Recommendations:  Problem # 1:  DIABETES MELLITUS, TYPE II (ICD-250.00) much better control  Other Orders: T-Fructosamine ([id number])  Est. Patient Level III (18841)  Patient Instructions: 1)  tests are being ordered for you today.  a few days after the test(s), please call [phone] to hear your test results. 2)  pending the test results, please continue the same medications for now 3)  Please schedule a follow-up appointment in 2 months. 4)  (update: i left message on phone-tree:  rx as we discussed)

## 2010-07-19 NOTE — Assessment & Plan Note (Signed)
Summary: rov for osa   Copy to:  Re- Establish/ Former Pt Primary Provider/Referring Provider:  Romero Belling, MD  CC:  Pt is here for a 1 yr f/u appt.  Pt states she wears her cpap machine every night.  Approx 4 hours per night.  Pt c/o mask leaks.  Marland Kitchen  History of Present Illness: the pt comes in today for f/u of her osa.  She has been wearing cpap everynight, but only 4hrs a night.  She has no issues with pressure, but is having leaks from her mask that bother her.  She is due for a replacement currently.  She has actually lost 14 pounds since the last visit, and I have asked her to continue with this.  She sleeps well with the cpap, and is more rested, but still has some daytime sleepiness at times (prob. due to mask leak).  Medications Prior to Update: 1)  Klor-Con M20 20 Meq  Tbcr (Potassium Chloride Crys Cr) .Marland Kitchen.. 1 Tab Three Times A Day 2)  Isosorbide Dinitrate 30 Mg  Tabs (Isosorbide Dinitrate) .... Take 1 By Mouth Bid 3)  Advair Diskus 100-50 Mcg/dose  Misc (Fluticasone-Salmeterol) .Marland Kitchen.. 1 Puff Bid 4)  Aspirin 325 Mg  Tbec (Aspirin) .... Take 1 By Mouth Qd 5)  Lasix 80 Mg  Tabs (Furosemide) .Marland Kitchen.. 1 1/2 Tab Two Times A Day 6)  Tramadol Hcl 50 Mg  Tabs (Tramadol Hcl) .... Take 1/2 Q 4 Hours Prn 7)  Promethazine Hcl 25 Mg  Tabs (Promethazine Hcl) .... Take 1 By Mouth Prn 8)  Novolin 70/30 70-30 %  Susp (Insulin Isophane & Regular) .Marland KitchenMarland KitchenMarland Kitchen 190 Units Q.a.m., 10 Units Q.p.m. 9)  Metolazone 5 Mg  Tabs (Metolazone) .... Qd 10)  Crestor 40 Mg Tabs (Rosuvastatin Calcium) .... Qd 11)  Hydrocodone-Acetaminophen 5-325 Mg Tabs (Hydrocodone-Acetaminophen) .... Take 1-2 By Mouth Q 6 Hours Prn 12)  Hydralazine Hcl 25 Mg Tabs (Hydralazine Hcl) .... Tid 13)  Freestyle Test  Strp (Glucose Blood) .... Qid, and Lancets, 250.01, Variable Glucoses 14)  Labetalol Hcl 200 Mg Tabs (Labetalol Hcl) .... Take Two Tablets By Mouth Twice A Day 15)  Zetia 10 Mg Tabs (Ezetimibe) .Marland Kitchen.. 1 Qd  Allergies (verified): 1)  !  Norvasc 2)  ! * Iv Dye  Review of Systems      See HPI  Vital Signs:  Patient profile:   77 year old female Height:      66 inches Weight:      217 pounds BMI:     35.15 O2 Sat:      97 % on Room air Temp:     98.1 degrees F oral Pulse rate:   81 / minute BP sitting:   140 / 82  (left arm) Cuff size:   large  Vitals Entered By: Arman Filter LPN (October 12, 2009 10:44 AM)  O2 Flow:  Room air CC: Pt is here for a 1 yr f/u appt.  Pt states she wears her cpap machine every night.  Approx 4 hours per night.  Pt c/o mask leaks.   Comments Medications reviewed with patient Arman Filter LPN  October 12, 2009 10:47 AM    Physical Exam  General:  obese female in nad Nose:  no skin breakdown or pressure necrosis from cpap mask Neurologic:  alert, not sleepy, moves all 4.   Impression & Recommendations:  Problem # 1:  OBSTRUCTIVE SLEEP APNEA (ICD-327.23) the pt is doing well with cpap, but needs a new mask,  and needs to try and wear all night.  She has lost weight since the last visit, and I have asked her to continue with this.  Will get her a replacement mask, and see her back in one year.  Other Orders: Est. Patient Level II (45409) DME Referral (DME)  Patient Instructions: 1)  will send advanced an order to get you a new mask 2)  continue to work on weight loss 3)  try and wear cpap all night during the night 4)  followup with me in one year.

## 2010-07-19 NOTE — Assessment & Plan Note (Signed)
Summary: 1 MTH FU--STC   Vital Signs:  Patient profile:   77 year old female Height:      65 inches (165.10 cm) Weight:      214.75 pounds (97.61 kg) BMI:     35.87 O2 Sat:      96 % on Room air Temp:     97.7 degrees F (36.50 degrees C) oral Pulse rate:   76 / minute BP sitting:   126 / 74  (left arm) Cuff size:   large  Vitals Entered By: Brenton Grills CMA (AAMA) (May 07, 2010 9:30 AM)  O2 Flow:  Room air CC: 1 month F/U/aj Is Patient Diabetic? Yes   Referring Timmi Devora:  Re- Establish/ Former Pt Primary Julieth Tugman:  Romero Belling, MD  CC:  1 month F/U/aj.  History of Present Illness: she brings a record of her cbg's which i have reviewed today.  it varies widely--from 40-400, with no trend throughout the day.  she says she never misses the insulin.  however, she sometimes misses lunch.     Current Medications (verified): 1)  Klor-Con M20 20 Meq  Tbcr (Potassium Chloride Crys Cr) .Marland Kitchen.. 1 Tab Three Times A Day 2)  Isosorbide Dinitrate 30 Mg  Tabs (Isosorbide Dinitrate) .... Take 1 By Mouth Two Times A Day 3)  Advair Diskus 100-50 Mcg/dose  Misc (Fluticasone-Salmeterol) .Marland Kitchen.. 1 Puff Two Times A Day\par 4)  Lasix 80 Mg  Tabs (Furosemide) .Marland Kitchen.. 1 1/2 Tab Two Times A Day 5)  Metolazone 5 Mg  Tabs (Metolazone) .Marland Kitchen.. 1 Tab Two Times A Day 6)  Crestor 40 Mg Tabs (Rosuvastatin Calcium) .... Once Daily 7)  Hydralazine Hcl 25 Mg Tabs (Hydralazine Hcl) .... Three Times A Day 8)  Freestyle Test  Strp (Glucose Blood) .... 2x A Day, and Lancets, 250.01 9)  Labetalol Hcl 200 Mg Tabs (Labetalol Hcl) .... Take Two Tablets By Mouth Twice A Day 10)  Zetia 10 Mg Tabs (Ezetimibe) .Marland Kitchen.. 1 Once Daily 11)  Aspirin 325 Mg Tabs (Aspirin) .Marland Kitchen.. 1 Once Daily 12)  Cpap Machine .... At Bedtime 13)  Hydrocodone-Acetaminophen 10-325 Mg Tabs (Hydrocodone-Acetaminophen) .... 1/2 or 1 Pill Every 4 Hrs As Needed For Pain 14)  Humulin N 100 Unit/ml Susp (Insulin Isophane Human) .Marland KitchenMarland KitchenMarland Kitchen 175 Units Am and 40 Units in  The Evening 15)  Nitrostat 0.4 Mg Subl (Nitroglycerin) .Marland Kitchen.. 1 Tablet Under Tongue At Onset of Chest Pain; You May Repeat Every 5 Minutes For Up To 3 Doses.  Allergies (verified): 1)  ! Norvasc 2)  ! 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    Physical Exam  General:  obese.  no distress  Skin:  injection sites without lesions.     Impression & Recommendations:  Problem # 1:  DIABETES MELLITUS, TYPE II (ICD-250.00) therapy limited by pt's request for least expensive meds, and by her need for a simple regimen.    Other Orders: Est. Patient Level III (42595)  Patient Instructions: 1)  reduce nph humulin to 175 units  each am and 40 units in the evening. 2)  check your blood sugar 2 times a day.  vary the time of day when you check, between before the 3 meals, and at bedtime.  also check if you have symptoms of your blood sugar being too high or too low.  please keep a record of the readings and bring it to your next appointment here.  please call us sooner if you are having low blood sugar episodes. please make comments on the page, so we can understand why your blood sugar varies so much.   3)  on this type of insulin schedule, it is important to eat meals on a schedule.   4)  Please schedule a physical appointment in 2 months.   Orders Added: 1)   Est. Patient Level III [16109]

## 2010-07-19 NOTE — Letter (Signed)
Summary: [facility]   Imported By: Sherian Rein 05/21/2010 07:45:19  _____________________________________________________________________  External Attachment:    Type:   Image     Comment:   External Document

## 2010-07-19 NOTE — Letter (Signed)
Summary: [facility] Patient Note   [facility] Patient Note   Imported By: Roderic Ovens 07/05/2010 15:18:55  _____________________________________________________________________  External Attachment:    Type:   Image     Comment:   External Document

## 2010-07-19 NOTE — Assessment & Plan Note (Signed)
Summary: PER PT 1 WK FU---STC   Vital Signs:  Patient profile:   77 year old female Height:      66 inches (167.64 cm) Weight:      209.25 pounds (95.11 kg) O2 Sat:      95 % on Room air Temp:     97.4 degrees F (36.33 degrees C) oral Pulse rate:   73 / minute BP sitting:   142 / 84  (left arm) Cuff size:   large  Vitals Entered By: Josph Macho RMA (Oct 31, 2009 8:01 AM)  O2 Flow:  Room air CC: 1 week follow up/ CF Is Patient Diabetic? Yes   Referring Provider:  Re- Establish/ Former Pt Primary Provider:  Romero Belling, MD  CC:  1 week follow up/ CF.  History of Present Illness: the status of at least 3 ongoing medical problems is addressed today: dm:  she brings a record of her cbg's which i have reviewed today.  it varies from 78 (am) to 350 (before supper).  however, it varies, and there are some cbg's as low as mid-100's at any time of day. chf:  she still has slight doe. gastroenteritis:  nausea is improved but not resolved.  Current Medications (verified): 1)  Klor-Con M20 20 Meq  Tbcr (Potassium Chloride Crys Cr) .Marland Kitchen.. 1 Tab Three Times A Day 2)  Isosorbide Dinitrate 30 Mg  Tabs (Isosorbide Dinitrate) .... Take 1 By Mouth Two Times A Day 3)  Advair Diskus 100-50 Mcg/dose  Misc (Fluticasone-Salmeterol) .Marland Kitchen.. 1 Puff Two Times A Day\par 4)  Lasix 80 Mg  Tabs (Furosemide) .Marland Kitchen.. 1 1/2 Tab Two Times A Day 5)  Tramadol Hcl 50 Mg  Tabs (Tramadol Hcl) .... Take 1/2 Q 4 Hours Prn 6)  Novolin 70/30 70-30 %  Susp (Insulin Isophane & Regular) .... 220 Units Each Am 7)  Metolazone 5 Mg  Tabs (Metolazone) .Marland Kitchen.. 1 Tab Two Times A Day 8)  Crestor 40 Mg Tabs (Rosuvastatin Calcium) .... Once Daily 9)  Hydrocodone-Acetaminophen 5-325 Mg Tabs (Hydrocodone-Acetaminophen) .... Take 1-2 By Mouth Q 6 Hours Prn 10)  Hydralazine Hcl 25 Mg Tabs (Hydralazine Hcl) .... Three Times A Day 11)  Freestyle Test  Strp (Glucose Blood) .... 2x A Day, and Lancets, 250.01 12)  Labetalol Hcl 200 Mg Tabs  (Labetalol Hcl) .... Take Two Tablets By Mouth Twice A Day 13)  Zetia 10 Mg Tabs (Ezetimibe) .Marland Kitchen.. 1 Once Daily 14)  Aspirin 325 Mg Tabs (Aspirin) .Marland Kitchen.. 1 Once Daily  Allergies (verified): 1)  ! Norvasc 2)  ! 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         edema is improved  Physical Exam  General:  obese.  no distress  Lungs:  Clear to auscultation bilaterally. Normal respiratory effort.  Extremities:  trace right pedal edema and trace left pedal edema.   Additional Exam:  Sodium  138 mEq/L                   135-145   Potassium                 3.8 mEq/L                   3.5-5.1   Chloride                  97 mEq/L                    96-112   Carbon Dioxide            30 mEq/L                    19-32   Glucose              [H]  303 mg/dL                   04-54   BUN                  [H]  27 mg/dL                    0-98   Creatinine                1.2 mg/dL                   1.1-9.1   Calcium                   9.4 mg/dL                   4.7-82.9     B-Type Natriuetic Peptide      [H]  322.0 pg/mL      Impression & Recommendations:  Problem # 1:  DIABETES MELLITUS, TYPE II (ICD-250.00) Assessment Improved  Problem # 2:  DIASTOLIC HEART FAILURE, ACUTE ON CHRONIC  (ICD-428.33) Assessment: Improved  Problem # 3:  nausea prob residual after recent gi illness  Other Orders: TLB-BMP (Basic Metabolic Panel-BMET) ([id number]) TLB-BNP (B-Natriuretic Peptide) ([id number]) Est. Patient Level IV (56213)  Patient Instructions: 1)  tests are being ordered for you today.  a few days after the test(s), please call [phone] to hear your test results. 2)  pending the test results, please continue the same medications for now 3)  Please schedule a follow-up appointment in 1 month. 4)  we'll evaluate the nausea further if it persists. 5)  (update: i left message on phone-tree:  rx as we discussed)

## 2010-07-19 NOTE — Progress Notes (Signed)
Summary: Lab results  Phone Note Call from Patient Call back at Home Phone [phone]   Caller: Patient Reason for Call: Lab or Test Results Summary of Call: Pt left message request results of lab test Initial call taken by: Brenton Grills MA,  February 02, 2010 3:09 PM  Follow-up for Phone Call        Pt informed of results and also MD's advisement to keep record of CBG's. Follow-up by: Brenton Grills MA,  February 02, 2010 3:10 PM

## 2010-07-19 NOTE — Assessment & Plan Note (Signed)
Summary: 6 mo f/u ./cy  Medications Added NITROSTAT 0.4 MG SUBL (NITROGLYCERIN) 1 tablet under tongue at onset of chest pain; you may repeat every 5 minutes for up to 3 doses.        Visit Type:  6 mo f/u Referring Armando Bukhari:  Re- Establish/ Former Pt Primary Davidson Palmieri:  Romero Belling, MD  CC:  pt states she has a little dizziness this morning but she has not eaten breakfast yet...sob.....little chest discomfort...denies any edema..pt states she is cold lately hands are cold as well.  History of Present Illness: Mrs. Hammett comes in today for management of her complex cardiovascular history.  She denies any angina or ischemic symptoms. He's had no orthopnea, PND and her edema has been stable. Her weight has been stable.  Primary care almost put her in the hospital last week because her blood sugar. She is usually compliant with diet but tells me she is to leave here and sausage gravy biscuit!  She is taking her medications. She is not carrying nitroglycerin.  Clinical Reports Reviewed:  Cardiac Cath:  07/04/2008: Cardiac Cath Findings:   ASSESSMENT:   1. Nonobstructive coronary artery disease.   2. Severe hypertension.      PLAN/DISCUSSION:  I have reviewed the catheterization films with Dr.   Clifton James.  We both feel that the RCA lesion is not flow limiting.  I   suspect her chest pain is due to her severe hypertension and probable   elevated LVEDP.  We will continue risk factor management.  Currently,   she can proceed to surgery without further cardiac workup.               Bevelyn Buckles. Bensimhon, MD   Electronically Signed         06/12/2006: Cardiac Cath Findings:   CONCLUSION:  1. Nonobstructive coronary artery disease with 30% narrowing in the      mid-LAD, 40% narrowing in the mid distal circumflex artery, and 30%      narrowing in the proximal right coronary artery with normal left      ventricular function and estimated ejection fraction of 60%.  2. Elevated  pulmonary artery wedge pressure and elevated pulmonary      artery pressure and pulmonary vascular resistance.   RECOMMENDATIONS:  The patient has no source of ischemia.  She does have  elevated left ventricular filling pressures consistent with diastolic  dysfunction.  She also has pulmonary hypertension and elevated pulmonary  resistance out of proportion to her filling pressures.  This may be  multiple multifactorial, including secondary to obstructive sleep apnea  and her elevated pulmonary wedge pressure.  She does have an elevated D-  dimer and we  will plan to do a CT scan with contrast to exclude pulmonary embolism.  We will make sure creatinine is stable tomorrow before proceeding with  that.  We will try and optimize her blood pressure control to help in  treatment of her diastolic dysfunction and will continue diuresis.   Bruce Elvera Lennox Juanda Chance, MD, [facility]  Electronically Signed   Current Medications (verified): 1)  Klor-Con M20 20 Meq  Tbcr (Potassium Chloride Crys Cr) .Marland Kitchen.. 1 Tab Three Times A Day 2)  Isosorbide Dinitrate 30 Mg  Tabs (Isosorbide Dinitrate) .... Take 1 By Mouth Two Times A Day 3)  Advair Diskus 100-50 Mcg/dose  Misc (Fluticasone-Salmeterol) .Marland Kitchen.. 1 Puff Two Times A Day\par 4)  Lasix 80 Mg  Tabs (Furosemide) .Marland Kitchen.. 1 1/2 Tab Two Times A Day 5)  Metolazone 5 Mg  Tabs (Metolazone) .Marland Kitchen.. 1 Tab Two Times A Day 6)  Crestor 40 Mg Tabs (Rosuvastatin Calcium) .... Once Daily 7)  Hydralazine Hcl 25 Mg Tabs (Hydralazine Hcl) .... Three Times A Day 8)  Freestyle Test  Strp (Glucose Blood) .... 2x A Day, and Lancets, 250.01 9)  Labetalol Hcl 200 Mg Tabs (Labetalol Hcl) .... Take Two Tablets By Mouth Twice A Day 10)  Zetia 10 Mg Tabs (Ezetimibe) .Marland Kitchen.. 1 Once Daily 11)  Aspirin 325 Mg Tabs (Aspirin) .Marland Kitchen.. 1 Once Daily 12)  Cpap Machine .... At Bedtime 13)  Hydrocodone-Acetaminophen 10-325 Mg Tabs (Hydrocodone-Acetaminophen) .... 1/2 or 1 Pill Every 4 Hrs As Needed For Pain 14)  Humulin  N 100 Unit/ml Susp (Insulin Isophane Human) .Marland KitchenMarland KitchenMarland Kitchen 175 Units Am and 40 Units in The Evening  Allergies: 1)  ! Norvasc 2)  ! * Iv Dye  Past History:  Past Medical History: Last updated: 04/02/2010 Congestive heart failure - diastolic non-ischemic cardiomyopathy Coronary artery disease - minimal Diabetes mellitus, type II GERD Hypertension Peripheral vascular disease Obese Dyslipidemia Zoster DM Peripheral Neuropathy Osteoarthritis spine - cervical Non-compliance DM Retinopathy Mild asthma Sleep Apnea -iwith CPAP Idiopathic Pruritis ASCVD on Mri Brain Hyperlipidemia hx of shingles Peripheral neuropathy COPD Asthma uterine cancer Depression Peptic ulcer disease - duodenal Peptic stricture Hiatal hernia Diverticulosis Arthritis  Past Surgical History: Last updated: 04/05/2009 Tubal ligation (1967) (L) Knee Arthroscopy (10/1998) (L) Craniotomy for SDH (1997) DEXA (12/2003) Bilateral cataracts (2005) EGD (04/04/2004) Hysterectomy c-spine and lumbar surgury cholecystectomy 2010 Heart Caths  Family History: Last updated: 04/05/2009 father with pacemaker, lymphoma mother with "stomach" cancer asthma rheumatism: father ,mother  Family History of Diabetes: mother Family History of Heart Disease: mother Family History of Kidney Disease: PGM  Social History: Last updated: 04/05/2009 Alcohol use-yes - rare Married retired 7 children Patient never smoked.  Illicit Drug Use - no  Risk Factors: Smoking Status: never (10/12/2008)  Review of Systems       negative other than history of present illness  Vital Signs:  Patient profile:   77 year old female Height:      65 inches Weight:      215.8 pounds Pulse rate:   82 / minute Pulse rhythm:   irregular BP sitting:   132 / 74  (left arm) Cuff size:   large  Vitals Entered By: Danielle Rankin, CMA (April 30, 2010 9:26 AM)  Physical Exam  General:  obese.  no acute distress Head:  normocephalic and  atraumatic Eyes:  PERRLA/EOM intact; conjunctiva and lids normal. Neck:  Neck supple, no JVD. No masses, thyromegaly or abnormal cervical nodes. Chest Wall:  no deformities or breast masses noted Lungs:  Clear bilaterally to auscultation and percussion. Heart:  PMI poorly appreciated, regular rate and rhythm, no obvious gallop Msk:  decreased ROM.   Pulses:  pulses normal in all 4 extremities Extremities:  1+ left pedal edema and trace right pedal edema.   Neurologic:  Alert and oriented x 3. Skin:  Intact without lesions or rashes. Psych:  Normal affect.   Impression & Recommendations:  Problem # 1:  DIASTOLIC HEART FAILURE, ACUTE ON CHRONIC (ICD-428.33) Assessment Unchanged  Despite her noncompliance with her diet, her heart failure is relatively stable. Have encouraged her to watch her salt in her weight. Her updated medication list for this problem includes:    Isosorbide Dinitrate 30 Mg Tabs (Isosorbide dinitrate) .Marland Kitchen... Take 1 by mouth two times a day    Lasix  80 Mg Tabs (Furosemide) .Marland Kitchen... 1 1/2 tab two times a day    Metolazone 5 Mg Tabs (Metolazone) .Marland Kitchen... 1 tab two times a day    Labetalol Hcl 200 Mg Tabs (Labetalol hcl) .Marland Kitchen... Take two tablets by mouth twice a day    Aspirin 325 Mg Tabs (Aspirin) .Marland Kitchen... 1 once daily    Nitrostat 0.4 Mg Subl (Nitroglycerin) .Marland Kitchen... 1 tablet under tongue at onset of chest pain; you may repeat every 5 minutes for up to 3 doses.  Orders: EKG w/ Interpretation (93000)  Problem # 2:  CAROTID BRUIT, LEFT (ICD-785.9) Assessment: Unchanged  Problem # 3:  EDEMA (ICD-782.3) Assessment: Unchanged  Problem # 4:  CORONARY ARTERY DISEASE (ICD-414.00) Will give supplemental nitroglycerin and  instructions for use. Her updated medication list for this problem includes:    Isosorbide Dinitrate 30 Mg Tabs (Isosorbide dinitrate) .Marland Kitchen... Take 1 by mouth two times a day    Labetalol Hcl 200 Mg Tabs (Labetalol hcl) .Marland Kitchen... Take two tablets by mouth twice a day     Aspirin 325 Mg Tabs (Aspirin) .Marland Kitchen... 1 once daily    Nitrostat 0.4 Mg Subl (Nitroglycerin) .Marland Kitchen... 1 tablet under tongue at onset of chest pain; you may repeat every 5 minutes for up to 3 doses.  Patient Instructions: 1)  Your physician recommends that you schedule a follow-up appointment in: 6 months with Dr. Daleen Squibb 2)  Your physician recommends that you continue on your current medications as directed. Please refer to the Current Medication list given to you today. 3)  Your physician has requested that you limit the intake of sodium (salt) in your diet to two grams daily. 4)  Your physician recommended you take 1 tablet (or 1 spray) under tongue at onset of chest pain; you may repeat every 5 minutes for up to 3 doses. If 3 or more doses are required, call 911 and proceed to the ER immediately. Prescriptions: NITROSTAT 0.4 MG SUBL (NITROGLYCERIN) 1 tablet under tongue at onset of chest pain; you may repeat every 5 minutes for up to 3 doses.  #25 x 11   Entered by:   Lisabeth Devoid RN   Authorized by:   Gaylord Shih, MD, [facility]   Signed by:   Lisabeth Devoid RN on 04/30/2010   Method used:   Electronically to        [facility]* (retail)       [address]       [place], [place]  [address]       Ph: [phone]       Fax: [phone]   RxID:   [id number]

## 2010-07-19 NOTE — Assessment & Plan Note (Signed)
Summary: per check/ok per md/saf  Medications Added KLOR-CON M20 20 MEQ  TBCR (POTASSIUM CHLORIDE CRYS CR) 1 tab three times a day LASIX 80 MG  TABS (FUROSEMIDE) 1 1/2 tab two times a day        Visit Type:  2 wk f/u Referring Provider:  Re- Establish/ Former Pt Primary Provider:  Romero Belling, MD   History of Present Illness: Ms [name] times a day for close followup of her acute on chronic diastolic heart failure.  Since we increased her Lasix she has lost 4 pounds on her scale. She still has some dyspnea on exertion but is improved. She also says she gets short of breath when she lies down.  She denies any angina or palpitations.  Current Medications (verified): 1)  Klor-Con M20 20 Meq  Tbcr (Potassium Chloride Crys Cr) .Marland Kitchen.. 1 Tab Three Times A Day 2)  Isosorbide Dinitrate 30 Mg  Tabs (Isosorbide Dinitrate) .... Take 1 By Mouth Bid 3)  Advair Diskus 100-50 Mcg/dose  Misc (Fluticasone-Salmeterol) .Marland Kitchen.. 1 Puff Bid 4)  Aspirin 325 Mg  Tbec (Aspirin) .... Take 1 By Mouth Qd 5)  Lasix 80 Mg  Tabs (Furosemide) .Marland Kitchen.. 1 1/2 Tab Two Times A Day 6)  Tramadol Hcl 50 Mg  Tabs (Tramadol Hcl) .... Take 1/2 Q 4 Hours Prn 7)  Promethazine Hcl 25 Mg  Tabs (Promethazine Hcl) .... Take 1 By Mouth Prn 8)  Novolin 70/30 70-30 %  Susp (Insulin Isophane & Regular) .Marland KitchenMarland KitchenMarland Kitchen 190 Units Q.a.m., 10 Units Q.p.m. 9)  Metolazone 5 Mg  Tabs (Metolazone) .... Qd 10)  Crestor 40 Mg Tabs (Rosuvastatin Calcium) .... Qd 11)  Hydrocodone-Acetaminophen 5-325 Mg Tabs (Hydrocodone-Acetaminophen) .... Take 1-2 By Mouth Q 6 Hours Prn 12)  Hydralazine Hcl 25 Mg Tabs (Hydralazine Hcl) .... Tid 13)  Freestyle Test  Strp (Glucose Blood) .... Qid, and Lancets, 250.01, Variable Glucoses 14)  Labetalol Hcl 200 Mg Tabs (Labetalol Hcl) .... Take Two Tablets By Mouth Twice A Day 15)  Zetia 10 Mg Tabs (Ezetimibe) .Marland Kitchen.. 1 Qd  Allergies: 1)  ! Norvasc 2)  ! * Iv Dye  Past History:  Past Medical History: Last updated:  04/05/2009 Congestive heart failure - diastolic non-ischemic cardiomyopathy Coronary artery disease - minimal Diabetes mellitus, type II GERD Hypertension Peripheral vascular disease Obese Dyslipidemia Zoster DM Peripheral Neuropathy Osteoarthritis spine - cervical Non-compliance DM Retinopathy Mild asthma Sleep Apnea -iwith CPAP Idiopathic Pruritis ASCVD on Mri Brain Hyperlipidemia hx of shingles Peripheral neuropathy COPD Asthma uterine cancer Depression Peptic ulcer disease - duodenal Peptic stricture Hiatal hernia Diverticulosis Arthritis Congestive Heart Failure Urinary Tract Infection  Past Surgical History: Last updated: 04/05/2009 Tubal ligation (1967) (L) Knee Arthroscopy (10/1998) (L) Craniotomy for SDH (1997) DEXA (12/2003) Bilateral cataracts (2005) EGD (04/04/2004) Hysterectomy c-spine and lumbar surgury cholecystectomy 2010 Heart Caths  Family History: Last updated: 04/05/2009 father with pacemaker, lymphoma mother with "stomach" cancer asthma rheumatism: father ,mother  Family History of Diabetes: mother Family History of Heart Disease: mother Family History of Kidney Disease: PGM  Social History: Last updated: 04/05/2009 Alcohol use-yes - rare Married retired 7 children Patient never smoked.  Illicit Drug Use - no  Risk Factors: Smoking Status: never (10/12/2008)  Review of Systems       negative other than history of present illness  Vital Signs:  Patient profile:   77 year old female Height:      66 inches Weight:      218 pounds BMI:  35.31 Pulse rate:   60 / minute Pulse rhythm:   regular BP sitting:   110 / 68  (left arm) Cuff size:   large  Vitals Entered By: Danielle Rankin, CMA (June 22, 2009 9:18 AM)  Physical Exam  General:  no acute distress, skin warm and dry,  obese.  obese.   Head:  normocephalic and atraumatic Eyes:  injected sclera Lungs:  cto auscultation percussion, no rales Heart:  regular  rate and rhythm, no gallop, PMI heart appreciate Msk:  decreased ROM.  decreased ROM.   Pulses:  dished but present lower extremities Extremities:  no edema whatsoever Neurologic:  Alert and oriented x 3. Skin:  Intact without lesions or rashes. Psych:  Normal affect.   Impression & Recommendations:  Problem # 1:  DIASTOLIC HEART FAILURE, ACUTE ON CHRONIC (ICD-428.33) Assessment Improved Her weight is down 4 pounds. She is less short of breath but still has dyspnea on exertion and some orthopnea. Her exam appears to be euvolemic. I will check a BNP and electrolytes. Will make no further changes today. I'm also delighted her blood pressures under good control. I suspect most of her dyspnea on exertion is multifactorial. We'll see her back in 4 weeks. Her updated medication list for this problem includes:    Isosorbide Dinitrate 30 Mg Tabs (Isosorbide dinitrate) .Marland Kitchen... Take 1 by mouth bid    Aspirin 325 Mg Tbec (Aspirin) .Marland Kitchen... Take 1 by mouth qd    Lasix 80 Mg Tabs (Furosemide) .Marland Kitchen... 1 1/2 tab two times a day    Metolazone 5 Mg Tabs (Metolazone) ..... Qd    Labetalol Hcl 200 Mg Tabs (Labetalol hcl) .Marland Kitchen... Take two tablets by mouth twice a day  Problem # 2:  DYSPNEA (ICD-786.05) Assessment: Improved  Her updated medication list for this problem includes:    Aspirin 325 Mg Tbec (Aspirin) .Marland Kitchen... Take 1 by mouth qd    Lasix 80 Mg Tabs (Furosemide) .Marland Kitchen... 1 1/2 tab two times a day    Metolazone 5 Mg Tabs (Metolazone) ..... Qd    Labetalol Hcl 200 Mg Tabs (Labetalol hcl) .Marland Kitchen... Take two tablets by mouth twice a day  Orders: TLB-BNP (B-Natriuretic Peptide) (83880-BNPR)  Problem # 3:  HYPERTENSION (ICD-401.9) Assessment: Improved  Her updated medication list for this problem includes:    Aspirin 325 Mg Tbec (Aspirin) .Marland Kitchen... Take 1 by mouth qd    Lasix 80 Mg Tabs (Furosemide) .Marland Kitchen... 1 1/2 tab two times a day    Metolazone 5 Mg Tabs (Metolazone) ..... Qd    Hydralazine Hcl 25 Mg Tabs  (Hydralazine hcl) .Marland Kitchen... Tid    Labetalol Hcl 200 Mg Tabs (Labetalol hcl) .Marland Kitchen... Take two tablets by mouth twice a day  Patient Instructions: 1)  Your physician recommends that you schedule a follow-up appointment in: 4 WEEKS WITH DR Patina Spanier 2)  Your physician recommends that you return for lab work in: TODAY BMET BNP 786.05 V58.69 3)  Your physician recommends that you continue on your current medications as directed. Please refer to the Current Medication list given to you today.

## 2010-07-25 NOTE — Miscellaneous (Signed)
Summary: BONE DENSITY  Clinical Lists Changes  Orders: Added new Test order of T-Lumbar Vertebral Assessment (77082) - Signed 

## 2010-08-02 NOTE — Assessment & Plan Note (Signed)
Summary: YEARLY--STC   Vital Signs:  Patient profile:   77 year old [name] Height:      65 inches (165.10 cm) Weight:      218 pounds (99.09 kg) BMI:     36.41 O2 Sat:      93 % on Room air Temp:     98.7 degrees F (37.06 degrees C) oral Pulse rate:   77 / minute BP sitting:   134 / 82  (left arm) Cuff size:   large  Vitals Entered By: Brenton Grills CMA Duncan Dull) (July 16, 2010 3:11 PM)  O2 Flow:  Room air CC: Yearly Medicare Wellness/aj Is Patient Diabetic? Yes   Referring Provider:  Re- Establish/ Former Pt Primary Provider:  Romero Belling, MD  CC:  Yearly Medicare Wellness/aj.  History of Present Illness: here for regular wellness examination.  He's feeling pretty well in general, and does not drink or smoke.   Current Medications (verified): 1)  Klor-Con M20 20 Meq  Tbcr (Potassium Chloride Crys Cr) .Marland Kitchen.. 1 Tab Three Times A Day 2)  Isosorbide Dinitrate 30 Mg  Tabs (Isosorbide Dinitrate) .... Take 1 By Mouth Two Times A Day 3)  Advair Diskus 100-50 Mcg/dose  Misc (Fluticasone-Salmeterol) .Marland Kitchen.. 1 Puff Two Times A Day\par 4)  Lasix 80 Mg  Tabs (Furosemide) .Marland Kitchen.. 1 1/2 Tab Two Times A Day 5)  Crestor 40 Mg Tabs (Rosuvastatin Calcium) .... Once Daily 6)  Hydralazine Hcl 25 Mg Tabs (Hydralazine Hcl) .... Three Times A Day 7)  Freestyle Test  Strp (Glucose Blood) .... 2x A Day, and Lancets, 250.01 8)  Labetalol Hcl 200 Mg Tabs (Labetalol Hcl) .... Take Two Tablets By Mouth Twice A Day 9)  Zetia 10 Mg Tabs (Ezetimibe) .Marland Kitchen.. 1 Once Daily 10)  Aspirin 325 Mg Tabs (Aspirin) .Marland Kitchen.. 1 Once Daily 11)  Cpap Machine .... At Bedtime 12)  Hydrocodone-Acetaminophen 10-325 Mg Tabs (Hydrocodone-Acetaminophen) .... 1/2 or 1 Pill Every 4 Hrs As Needed For Pain 13)  Humulin N 100 Unit/ml Susp (Insulin Isophane Human) .Marland KitchenMarland KitchenMarland Kitchen 185 Units Am and 30 Units in The Evening 14)  Nitrostat 0.4 Mg Subl (Nitroglycerin) .Marland Kitchen.. 1 Tablet Under Tongue At Onset of Chest Pain; You May Repeat Every 5 Minutes For Up To 3  Doses. 15)  Azithromycin 500 Mg Tabs (Azithromycin) .Marland Kitchen.. 1 Tab Once Daily 16)  Benzonatate 200 Mg Caps (Benzonatate) .Marland Kitchen.. 1 Tab Three Times A Day As Needed For Cough 17)  Tramadol Hcl 50 Mg Tabs (Tramadol Hcl) .Marland Kitchen.. 1 Every 4 Hrs As Needed For Pain 18)  Metolazone 10 Mg Tabs (Metolazone) .Marland Kitchen.. 1 Tab Two Times A Day  Allergies (verified): 1)  ! Norvasc 2)  ! * Iv Dye  Past History:  Past Medical History: Congestive heart failure - diastolic non-ischemic cardiomyopathy Coronary artery disease - minimal Diabetes mellitus, type II GERD Hypertension Peripheral vascular disease Obese Dyslipidemia Zoster DM Peripheral Neuropathy Osteoarthritis spine - cervical Non-compliance DM Retinopathy Mild asthma Sleep Apnea -iwith CPAP Idiopathic Pruritis ASCVD on Mri Brain Hyperlipidemia hx of shingles Peripheral neuropathy COPD Asthma uterine cancer Depression Peptic ulcer disease - duodenal Peptic stricture Hiatal hernia Diverticulosis Arthritis  opthal: rankin cardiol: wall  Family History: Reviewed history from 04/05/2009 and no changes required. father with pacemaker, lymphoma mother with "stomach" cancer asthma rheumatism: father ,mother  Family History of Diabetes: mother Family History of Heart Disease: mother Family History of Kidney Disease: PGM  Social History: Reviewed history from 04/05/2009 and no changes required. Alcohol use-yes - rare Married  retired 7 children Patient never smoked.  Illicit Drug Use - no  Review of Systems       The patient complains of weight loss and depression.  The patient denies fever, vision loss, decreased hearing, chest pain, dyspnea on exertion, abdominal pain, melena, hematochezia, severe indigestion/heartburn, hematuria, and suspicious skin lesions.         she has a slight cough and headache  Physical Exam  General:  obese.  no distress  Head:  head: no deformity eyes: no periorbital swelling, no  proptosis external nose and ears are normal mouth: no lesion seen Neck:  Supple without thyroid enlargement or tenderness.  Breasts:  sees gyn  Heart:  Regular rate and rhythm without murmurs or gallops noted. Normal S1,S2.   Abdomen:  abdomen is soft, nontender.  no hepatosplenomegaly.   not distended.  no hernia  Rectal:  sees gyn  Genitalia:  sees gyn  Msk:  muscle bulk and strength are grossly normal.  no obvious joint swelling.  gait is steady with a cane  Extremities:  trace right pedal edema and 1+ left pedal edema.   no deformity.  no ulcer on the feet.  feet are of normal color and temp.  mycotic toenails.   Neurologic:  sensation is intact to touch on the feet  Skin:  normal texture and temp.  no rash.  not diaphoretic  Cervical Nodes:  No significant adenopathy.  Psych:  Alert and cooperative; normal mood and affect; normal attention span and concentration.   Additional Exam:  SEPARATE EVALUATION FOLLOWS--EACH PROBLEM HERE IS NEW, NOT RESPONDING TO TREATMENT, OR POSES SIGNIFICANT RISK TO THE PATIENT'S HEALTH: HISTORY OF THE PRESENT ILLNESS: pt says she has mild hypoglycemia in the early hrs of the am.   sob is improved PAST MEDICAL HISTORY reviewed and up to date today REVIEW OF SYSTEMS: denies loc PHYSICAL EXAMINATION: dorsalis pedis intact bilat.  no carotid bruit sensation is intact to touch on the feet clear to auscultation.  no respiratory distress LAB/XRAY RESULTS: BUN                  [H]  33 mg/dL                    5-18 Creatinine           [H]  1.5 mg/dL    Hemoglobin A4Z       [H]  10.4 %   IMPRESSION: prerenal azotemia, worse dm, she needs some adjustment in her therapy PLAN: see instruction sheet   Impression & Recommendations:  Problem # 1:  ROUTINE GENERAL MEDICAL EXAM@HEALTH  CARE FACL (ICD-V70.0)  Medications Added to Medication List This Visit: 1)  Humulin N 100 Unit/ml Susp (Insulin isophane human) .... 200 units am and 10 units in the  evening  Other Orders: T-Bone Densitometry (66063) TLB-BMP (Basic Metabolic Panel-BMET) (80048-METABOL) TLB-BNP (B-Natriuretic Peptide) (83880-BNPR) Est. Patient Level III (01601) Est. Patient 65& > (09323)  Preventive Care Screening     gyn is dr Aldona Bar.   Patient Instructions: 1)  blood tests are being ordered for you today.  please call [phone] to hear your test results. 2)  pending the test results, please change insulin to 200 units am and 10 units in the evening. 3)  Please schedule a follow-up appointment in 1 month. 4)  please consider these measures for your health:  minimize alcohol.  do not use tobacco products.  have a colonoscopy at least every 10 years  from age 65.  keep firearms safely stored.  always use seat belts.  have working smoke alarms in your home.  see an eye doctor and dentist regularly.  never drive under the influence of alcohol or drugs (including prescription drugs).  5)  please let me know what your wishes would be, if artificial life support measures should become necessary.  it is critically important to prevent falling down (keep floor areas well-lit, dry, and free of loose objects). 6)  (update: we discussed code status.  pt requests full code, but would not want to be started or maintained on artificial life-support measures if there was not a reasonable chance of recovery) 7)  (update: i left message on phone-tree:  reduce metolozone to 5 mg two times a day)   Orders Added: 1)  T-Bone Densitometry [77080] 2)  TLB-BMP (Basic Metabolic Panel-BMET) [80048-METABOL] 3)  TLB-BNP (B-Natriuretic Peptide) [83880-BNPR] 4)  Est. Patient Level III [[address]] 5)  Est. Patient 65& > [60454]     Preventive Care Screening     gyn is dr Aldona Bar.

## 2010-08-06 ENCOUNTER — Telehealth: Payer: Self-pay | Admitting: Endocrinology

## 2010-08-07 ENCOUNTER — Telehealth: Payer: Self-pay | Admitting: Endocrinology

## 2010-08-14 NOTE — Progress Notes (Signed)
Summary: rx refill req  Phone Note From Pharmacy Message from:  Pharmacy on August 07, 2010 1:57 PM  Caller: [facility] [address] [phone]* Summary of Call: Refill request for Furosemide Initial call taken by: Brenton Grills CMA Duncan Dull),  August 07, 2010 1:58 PM    Prescriptions: LASIX 80 MG  TABS (FUROSEMIDE) 1 1/2 tab two times a day  #90 x 3   Entered by:   Brenton Grills CMA (AAMA)   Authorized by:   Minus Breeding MD   Signed by:   Brenton Grills CMA (AAMA) on 08/07/2010   Method used:   Electronically to        [id number] (retail)       [address]       [place], [place]  [address]       Ph: [phone]       Fax: [phone]   RxID:   [id number]

## 2010-08-14 NOTE — Progress Notes (Signed)
Summary: low CBG  Phone Note Call from Patient Call back at Home Phone [phone]   Caller: Patient Summary of Call: Pt called stating her blood sugar was 29 this am before eating. After pt had breakfast her blood sugar came up to 119. Pt had no symptoms but is requesting MD advisement. Initial call taken by: Margaret Pyle, CMA,  August 06, 2010 2:01 PM  Follow-up for Phone Call        stop evening insulin.  just take in am. Follow-up by: Minus Breeding MD,  August 06, 2010 2:24 PM  Additional Follow-up for Phone Call Additional follow up Details #1::        Pt informed and expressed understanding Additional Follow-up by: Margaret Pyle, CMA,  August 06, 2010 2:28 PM    New/Updated Medications: HUMULIN N 100 UNIT/ML SUSP (INSULIN ISOPHANE HUMAN) 200 units each morning

## 2010-08-15 ENCOUNTER — Ambulatory Visit (INDEPENDENT_AMBULATORY_CARE_PROVIDER_SITE_OTHER): Payer: Medicare Other | Admitting: Endocrinology

## 2010-08-15 ENCOUNTER — Encounter: Payer: Self-pay | Admitting: Endocrinology

## 2010-08-15 ENCOUNTER — Other Ambulatory Visit: Payer: Medicare Other

## 2010-08-15 ENCOUNTER — Other Ambulatory Visit: Payer: Self-pay | Admitting: Endocrinology

## 2010-08-15 DIAGNOSIS — I5033 Acute on chronic diastolic (congestive) heart failure: Secondary | ICD-10-CM

## 2010-08-15 DIAGNOSIS — E876 Hypokalemia: Secondary | ICD-10-CM

## 2010-08-15 DIAGNOSIS — E119 Type 2 diabetes mellitus without complications: Secondary | ICD-10-CM

## 2010-08-15 LAB — BASIC METABOLIC PANEL
Chloride: 97 mEq/L (ref 96–112)
GFR: 38.6 mL/min — ABNORMAL LOW (ref >60.00)
Glucose, Bld: 136 mg/dL — ABNORMAL HIGH (ref 70–99)
Potassium: 3.5 mEq/L (ref 3.5–5.1)
Sodium: 141 mEq/L (ref 135–145)

## 2010-08-28 NOTE — Assessment & Plan Note (Signed)
Summary: 1 MTH FU   STC   Vital Signs:  Patient profile:   77 year old female Height:      65 inches (165.10 cm) Weight:      215 pounds (97.73 kg) BMI:     35.91 O2 Sat:      97 % on Room air Temp:     98.0 degrees F (36.67 degrees C) oral Pulse rate:   86 / minute Pulse rhythm:   regular BP sitting:   140 / 82  (left arm) Cuff size:   large  Vitals Entered By: Brenton Grills CMA Duncan Dull) (August 15, 2010 9:13 AM)  O2 Flow:  Room air CC: 1 month F/U/aj Is Patient Diabetic? Yes   Referring Provider:  Re- Establish/ Former Pt Primary Provider:  Romero Belling, MD  CC:  1 month F/U/aj.  History of Present Illness: the status of at least 3 ongoing medical problems is addressed today: dm: she brings a record of her cbg's which i have reviewed today.  it varies from 56 (twice--once before breakfast, and once before lunch), up to 400 (afternoon).  however, most are in the 100's.  she gets weak when cbg is in the 50's.  edema: is improved, but she still has doe. hypokalemia: she takes klor as rx'ed.  muscle cramps persist, but are less now.    Current Medications (verified): 1)  Klor-Con M20 20 Meq  Tbcr (Potassium Chloride Crys Cr) .Marland Kitchen.. 1 Tab Three Times A Day 2)  Isosorbide Dinitrate 30 Mg  Tabs (Isosorbide Dinitrate) .... Take 1 By Mouth Two Times A Day 3)  Advair Diskus 100-50 Mcg/dose  Misc (Fluticasone-Salmeterol) .Marland Kitchen.. 1 Puff Two Times A Day\par 4)  Lasix 80 Mg  Tabs (Furosemide) .Marland Kitchen.. 1 1/2 Tab Two Times A Day 5)  Crestor 40 Mg Tabs (Rosuvastatin Calcium) .... Once Daily 6)  Hydralazine Hcl 25 Mg Tabs (Hydralazine Hcl) .... Three Times A Day 7)  Freestyle Test  Strp (Glucose Blood) .... 2x A Day, and Lancets, 250.01 8)  Labetalol Hcl 200 Mg Tabs (Labetalol Hcl) .... Take Two Tablets By Mouth Twice A Day 9)  Zetia 10 Mg Tabs (Ezetimibe) .Marland Kitchen.. 1 Once Daily 10)  Aspirin 325 Mg Tabs (Aspirin) .Marland Kitchen.. 1 Once Daily 11)  Cpap Machine .... At Bedtime 12)  Hydrocodone-Acetaminophen  10-325 Mg Tabs (Hydrocodone-Acetaminophen) .... 1/2 or 1 Pill Every 4 Hrs As Needed For Pain 13)  Humulin N 100 Unit/ml Susp (Insulin Isophane Human) .... 200 Units Each Morning 14)  Nitrostat 0.4 Mg Subl (Nitroglycerin) .Marland Kitchen.. 1 Tablet Under Tongue At Onset of Chest Pain; You May Repeat Every 5 Minutes For Up To 3 Doses. 15)  Azithromycin 500 Mg Tabs (Azithromycin) .Marland Kitchen.. 1 Tab Once Daily 16)  Benzonatate 200 Mg Caps (Benzonatate) .Marland Kitchen.. 1 Tab Three Times A Day As Needed For Cough 17)  Tramadol Hcl 50 Mg Tabs (Tramadol Hcl) .Marland Kitchen.. 1 Every 4 Hrs As Needed For Pain 18)  Metolazone 10 Mg Tabs (Metolazone) .Marland Kitchen.. 1 Tab Two Times A Day  Allergies (verified): 1)  ! Norvasc 2)  ! * Iv Dye  Past History:  Past Medical History: Last updated: 07/16/2010 Congestive heart failure - diastolic non-ischemic cardiomyopathy Coronary artery disease - minimal Diabetes mellitus, type II GERD Hypertension Peripheral vascular disease Obese Dyslipidemia Zoster DM Peripheral Neuropathy Osteoarthritis spine - cervical Non-compliance DM Retinopathy Mild asthma Sleep Apnea -iwith CPAP Idiopathic Pruritis ASCVD on Mri Brain Hyperlipidemia hx of shingles Peripheral neuropathy COPD Asthma uterine cancer Depression Peptic  ulcer disease - duodenal Peptic stricture Hiatal hernia Diverticulosis Arthritis  opthal: rankin cardiol: wall  Review of Systems  The patient denies syncope.         she says muscle strength is "pretty good."  Physical Exam  General:  obese.  no distress  Lungs:  Clear to auscultation bilaterally. Normal respiratory effort.  Extremities:  trace right pedal edema and 1+ left pedal edema.    Psych:  Alert and cooperative; normal mood and affect; normal attention span and concentration.   Additional Exam:  Fructosamine         [H]  366 umol/L  (converts to a1c of 7.8) Sodium                    141 mEq/L                   135-145   Potassium                 3.5 mEq/L                    3.5-5.1   Chloride                  97 mEq/L                    96-112   Carbon Dioxide            31 mEq/L                    19-32   Glucose              [H]  136 mg/dL                   04-54   BUN                  [H]  26 mg/dL                    0-98   Creatinine           [H]  1.7 mg/dL                   1.1-9.1   Calcium                   9.3 mg/dL                   4.7-82.9   B-Type Natriuetic Peptide       [H]  126.6 pg/mL      Impression & Recommendations:  Problem # 1:  DIABETES MELLITUS, TYPE II (ICD-250.00) this is the best control this pt should aim for, given this regimen, chosen for reasons of simplicity and cost, which does match insulin to her changing needs throughout the day  Problem # 2:  EDEMA (ICD-782.3) Assessment: Improved  Problem # 3:  HYPOKALEMIA (ICD-276.8) well-controlled  Problem # 4:  RENAL INSUFFICIENCY (ICD-588.9) Assessment: Unchanged  Medications Added to Medication List This Visit: 1)  Metolazone 5 Mg Tabs (Metolazone) .Marland Kitchen.. 1 tab two times a day  Other Orders: T-Fructosamine (56213-08657) TLB-BMP (Basic Metabolic Panel-BMET) (80048-METABOL) TLB-BNP (B-Natriuretic Peptide) (83880-BNPR) Est. Patient Level IV (84696)  Patient Instructions: 1)  blood tests are being ordered for you today.  please call [phone] to hear your test results. 2)  pending the test results, please continue the same medications for  now. 3)  Please schedule a follow-up appointment in 2 months. 4)  (update: i left message on phone-tree:  rx as we discussed). Prescriptions: METOLAZONE 5 MG TABS (METOLAZONE) 1 tab two times a day  #60 x 11   Entered and Authorized by:   Minus Breeding MD   Signed by:   Minus Breeding MD on 08/15/2010   Method used:   Electronically to        [id number] (retail)       [address]       [place], [place]  [address]       Ph: [phone]       Fax: [phone]   RxID:   [id number]    Orders Added: 1)  T-Fructosamine  [id number] 2)  TLB-BMP (Basic Metabolic Panel-BMET) [80048-METABOL] 3)  TLB-BNP (B-Natriuretic Peptide) [83880-BNPR] 4)  Est. Patient Level IV [51025]

## 2010-09-08 ENCOUNTER — Ambulatory Visit (INDEPENDENT_AMBULATORY_CARE_PROVIDER_SITE_OTHER): Payer: Medicare Other | Admitting: Family Medicine

## 2010-09-08 DIAGNOSIS — J45901 Unspecified asthma with (acute) exacerbation: Secondary | ICD-10-CM

## 2010-09-08 DIAGNOSIS — J40 Bronchitis, not specified as acute or chronic: Secondary | ICD-10-CM

## 2010-09-08 MED ORDER — ALBUTEROL SULFATE HFA 108 (90 BASE) MCG/ACT IN AERS: 2.0000 | INHALATION_SPRAY | Freq: Four times a day (QID) | RESPIRATORY_TRACT | Status: DC | PRN

## 2010-09-08 MED ORDER — DOXYCYCLINE HYCLATE 100 MG PO CAPS: 100.0000 mg | ORAL_CAPSULE | Freq: Two times a day (BID) | ORAL | Status: AC

## 2010-09-08 NOTE — Progress Notes (Signed)
  Subjective:    Patient ID: [name], female    DOB: Mar 16, 1930, 77 y.o.   MRN: [id number]  Cough This is a new problem. The current episode started in the past 7 days. The cough is productive of sputum. Associated symptoms include chills, a fever, nasal congestion, rhinorrhea, shortness of breath and wheezing. Pertinent negatives include no rash or sore throat. She has tried OTC cough suppressant for the symptoms. The treatment provided mild relief. Her past medical history is significant for asthma and COPD. There is no history of environmental allergies.  Fever  Associated symptoms include coughing and wheezing. Pertinent negatives include no rash or sore throat.   Left leg swelling off and on but her fluid pill doesn't seem to help. Says her weight is climbing.  Saw Dr. Everardo All about 3 weeks ago for it.  Never had [place] for look at blood flow.    Review of Systems  Constitutional: Positive for fever and chills.  HENT: Positive for rhinorrhea. Negative for sore throat.   Respiratory: Positive for cough, shortness of breath and wheezing.   Skin: Negative for rash.  Hematological: Negative for environmental allergies.       Objective:   Physical Exam  Constitutional: She appears well-developed and well-nourished.  HENT:  Head: Normocephalic and atraumatic.  Right Ear: External ear normal.  Left Ear: External ear normal.  Nose: Nose normal.  Mouth/Throat: Oropharynx is clear and moist.       Normal turbinates.   Eyes: Conjunctivae and EOM are normal. Pupils are equal, round, and reactive to light.  Neck: Normal range of motion. Neck supple. No thyromegaly present.  Cardiovascular: Normal rate, regular rhythm and normal heart sounds.   Pulmonary/Chest: Effort normal and breath sounds normal.  Lymphadenopathy:    She has no cervical adenopathy.   BP 162/82  Pulse 85  Temp(Src) 97.9 F (36.6 C) (Oral)  Wt 224 lb (101.606 kg)  SpO2 97%        Assessment & Plan:    Likely URI complicated by COPD/Asthma. - Will go ahead and give ABX to cover for bronchits and COPD.  She does not have an inhaler at home or on her med list. Will give rx for alubterol and instructed on use. Keep pulm appt in April. Warned to call if inc SOB or wheezing as may need to be relavaluate. She is not actively SOB in the room today and lung exam is normla. Pulse ox is reassuring as well. Peak flows were unavailbel to perform in the office.   LE edema- Need to f/u with PCP since this is recurrent. Notes she did have 1 + edema on the LLL.  Recommend arterial doppplers and if normal compression stockings. I didn't adjust her diuretic.

## 2010-09-08 NOTE — Patient Instructions (Addendum)
Start using the inhaler 2 puffs 3 x a day for about 3 days, then 2 puffs a day for 3 days then one puff a day for 3 days Complete the antibiotic If you feel you are getting worse or just not better in one week then call you regular doctor and ask for an appointment.  Keep you appointment with the Lung Doctor in April.

## 2010-09-10 ENCOUNTER — Emergency Department (HOSPITAL_COMMUNITY): Payer: Medicare Other

## 2010-09-10 ENCOUNTER — Encounter (HOSPITAL_COMMUNITY): Payer: Self-pay | Admitting: Radiology

## 2010-09-10 ENCOUNTER — Inpatient Hospital Stay (HOSPITAL_COMMUNITY)
Admission: EM | Admit: 2010-09-10 | Discharge: 2010-09-17 | DRG: 292 | Disposition: A | Discharge status: Home Health | Payer: Medicare Other | Attending: Internal Medicine | Admitting: Internal Medicine

## 2010-09-10 DIAGNOSIS — J441 Chronic obstructive pulmonary disease with (acute) exacerbation: Secondary | ICD-10-CM | POA: Diagnosis present

## 2010-09-10 DIAGNOSIS — E1165 Type 2 diabetes mellitus with hyperglycemia: Secondary | ICD-10-CM | POA: Diagnosis present

## 2010-09-10 DIAGNOSIS — J40 Bronchitis, not specified as acute or chronic: Secondary | ICD-10-CM | POA: Diagnosis present

## 2010-09-10 DIAGNOSIS — I739 Peripheral vascular disease, unspecified: Secondary | ICD-10-CM | POA: Diagnosis present

## 2010-09-10 DIAGNOSIS — I5033 Acute on chronic diastolic (congestive) heart failure: Principal | ICD-10-CM | POA: Diagnosis present

## 2010-09-10 DIAGNOSIS — E876 Hypokalemia: Secondary | ICD-10-CM | POA: Diagnosis not present

## 2010-09-10 DIAGNOSIS — R079 Chest pain, unspecified: Secondary | ICD-10-CM | POA: Diagnosis present

## 2010-09-10 DIAGNOSIS — E1129 Type 2 diabetes mellitus with other diabetic kidney complication: Secondary | ICD-10-CM | POA: Diagnosis present

## 2010-09-10 DIAGNOSIS — Z794 Long term (current) use of insulin: Secondary | ICD-10-CM

## 2010-09-10 DIAGNOSIS — R791 Abnormal coagulation profile: Secondary | ICD-10-CM | POA: Diagnosis present

## 2010-09-10 DIAGNOSIS — I509 Heart failure, unspecified: Secondary | ICD-10-CM | POA: Diagnosis present

## 2010-09-10 DIAGNOSIS — E11319 Type 2 diabetes mellitus with unspecified diabetic retinopathy without macular edema: Secondary | ICD-10-CM | POA: Diagnosis present

## 2010-09-10 DIAGNOSIS — N183 Chronic kidney disease, stage 3 unspecified: Secondary | ICD-10-CM | POA: Diagnosis present

## 2010-09-10 DIAGNOSIS — I129 Hypertensive chronic kidney disease with stage 1 through stage 4 chronic kidney disease, or unspecified chronic kidney disease: Secondary | ICD-10-CM | POA: Diagnosis present

## 2010-09-10 DIAGNOSIS — R112 Nausea with vomiting, unspecified: Secondary | ICD-10-CM | POA: Diagnosis not present

## 2010-09-10 DIAGNOSIS — E1139 Type 2 diabetes mellitus with other diabetic ophthalmic complication: Secondary | ICD-10-CM | POA: Diagnosis present

## 2010-09-10 DIAGNOSIS — I200 Unstable angina: Secondary | ICD-10-CM | POA: Diagnosis present

## 2010-09-10 DIAGNOSIS — E785 Hyperlipidemia, unspecified: Secondary | ICD-10-CM | POA: Diagnosis present

## 2010-09-10 DIAGNOSIS — K5900 Constipation, unspecified: Secondary | ICD-10-CM | POA: Diagnosis not present

## 2010-09-10 DIAGNOSIS — G4733 Obstructive sleep apnea (adult) (pediatric): Secondary | ICD-10-CM | POA: Diagnosis present

## 2010-09-10 DIAGNOSIS — N058 Unspecified nephritic syndrome with other morphologic changes: Secondary | ICD-10-CM | POA: Diagnosis present

## 2010-09-10 DIAGNOSIS — I2510 Atherosclerotic heart disease of native coronary artery without angina pectoris: Secondary | ICD-10-CM | POA: Diagnosis present

## 2010-09-10 DIAGNOSIS — N179 Acute kidney failure, unspecified: Secondary | ICD-10-CM | POA: Diagnosis present

## 2010-09-10 HISTORY — DX: Essential (primary) hypertension: I10

## 2010-09-10 LAB — BLOOD GAS, ARTERIAL
Acid-Base Excess: 3.8 mmol/L — ABNORMAL HIGH (ref 0.0–2.0)
Bicarbonate: 27 mEq/L — ABNORMAL HIGH (ref 20.0–24.0)
Drawn by: 295031
FIO2: 0.21 %
O2 Saturation: 92.3 %
Patient temperature: 98.6
TCO2: 22.8 mmol/L (ref 0–100)
pCO2 arterial: 37.6 mmHg (ref 35.0–45.0)
pH, Arterial: 7.47 — ABNORMAL HIGH (ref 7.350–7.400)
pO2, Arterial: 61.1 mmHg — ABNORMAL LOW (ref 80.0–100.0)

## 2010-09-10 LAB — BASIC METABOLIC PANEL
BUN: 24 mg/dL — ABNORMAL HIGH (ref 6–23)
CO2: 30 mEq/L (ref 19–32)
Calcium: 8.9 mg/dL (ref 8.4–10.5)
Chloride: 99 mEq/L (ref 96–112)
Creatinine, Ser: 1.41 mg/dL — ABNORMAL HIGH (ref 0.4–1.2)
GFR calc Af Amer: 44 mL/min — ABNORMAL LOW (ref >60)
GFR calc non Af Amer: 36 mL/min — ABNORMAL LOW (ref >60)
Glucose, Bld: 121 mg/dL — ABNORMAL HIGH (ref 70–99)
Potassium: 3.7 mEq/L (ref 3.5–5.1)
Sodium: 139 mEq/L (ref 135–145)

## 2010-09-10 LAB — CK TOTAL AND CKMB (NOT AT ARMC)
CK, MB: 8.3 ng/mL (ref 0.3–4.0)
Relative Index: 2.7 — ABNORMAL HIGH (ref 0.0–2.5)
Total CK: 313 U/L — ABNORMAL HIGH (ref 7–177)

## 2010-09-10 LAB — CBC
HCT: 50.6 % — ABNORMAL HIGH (ref 36.0–46.0)
Hemoglobin: 16.3 g/dL — ABNORMAL HIGH (ref 12.0–15.0)
MCH: 29.2 pg (ref 26.0–34.0)
MCHC: 32.2 g/dL (ref 30.0–36.0)
MCV: 90.5 fL (ref 78.0–100.0)
Platelets: 202 10*3/uL (ref 150–400)
RBC: 5.59 MIL/uL — ABNORMAL HIGH (ref 3.87–5.11)
RDW: 13.8 % (ref 11.5–15.5)
WBC: 8 10*3/uL (ref 4.0–10.5)

## 2010-09-10 LAB — DIFFERENTIAL
Basophils Absolute: 0 10*3/uL (ref 0.0–0.1)
Basophils Relative: 0 % (ref 0–1)
Eosinophils Absolute: 0.1 10*3/uL (ref 0.0–0.7)
Eosinophils Relative: 1 % (ref 0–5)
Lymphocytes Relative: 24 % (ref 12–46)
Lymphs Abs: 1.9 10*3/uL (ref 0.7–4.0)
Monocytes Absolute: 0.7 10*3/uL (ref 0.1–1.0)
Monocytes Relative: 9 % (ref 3–12)
Neutro Abs: 5.3 10*3/uL (ref 1.7–7.7)
Neutrophils Relative %: 67 % (ref 43–77)

## 2010-09-10 LAB — D-DIMER, QUANTITATIVE: D-Dimer, Quant: 0.91 ug/mL-FEU — ABNORMAL HIGH (ref 0.00–0.48)

## 2010-09-10 MED ORDER — TECHNETIUM TO 99M ALBUMIN AGGREGATED
5.0000 | Freq: Once | INTRAVENOUS | Status: AC | PRN
  Administered 2010-09-10: 5 via INTRAVENOUS

## 2010-09-10 MED ORDER — XENON XE 133 GAS
10.0000 | GAS_FOR_INHALATION | Freq: Once | RESPIRATORY_TRACT | Status: AC | PRN
  Administered 2010-09-10: 9.76 via RESPIRATORY_TRACT

## 2010-09-11 DIAGNOSIS — I5031 Acute diastolic (congestive) heart failure: Secondary | ICD-10-CM

## 2010-09-11 DIAGNOSIS — M79609 Pain in unspecified limb: Secondary | ICD-10-CM

## 2010-09-11 LAB — LIPID PANEL: Triglycerides: 72 mg/dL (ref <150)

## 2010-09-11 LAB — CARDIAC PANEL(CRET KIN+CKTOT+MB+TROPI)
CK, MB: 7.4 ng/mL (ref 0.3–4.0)
Total CK: 299 U/L — ABNORMAL HIGH (ref 7–177)

## 2010-09-11 LAB — CBC
HCT: 47.1 % — ABNORMAL HIGH (ref 36.0–46.0)
Hemoglobin: 14.7 g/dL (ref 12.0–15.0)
RDW: 13.9 % (ref 11.5–15.5)
WBC: 7.1 10*3/uL (ref 4.0–10.5)

## 2010-09-11 LAB — GLUCOSE, CAPILLARY
Glucose-Capillary: 363 mg/dL — ABNORMAL HIGH (ref 70–99)
Glucose-Capillary: 390 mg/dL — ABNORMAL HIGH (ref 70–99)

## 2010-09-11 LAB — BASIC METABOLIC PANEL
Calcium: 8.2 mg/dL — ABNORMAL LOW (ref 8.4–10.5)
GFR calc Af Amer: 36 mL/min — ABNORMAL LOW (ref >60)
GFR calc non Af Amer: 30 mL/min — ABNORMAL LOW (ref >60)
Glucose, Bld: 379 mg/dL — ABNORMAL HIGH (ref 70–99)
Sodium: 133 mEq/L — ABNORMAL LOW (ref 135–145)

## 2010-09-11 LAB — HEMOGLOBIN A1C: Mean Plasma Glucose: 226 mg/dL — ABNORMAL HIGH (ref <117)

## 2010-09-11 LAB — MRSA PCR SCREENING: MRSA by PCR: NEGATIVE

## 2010-09-11 NOTE — H&P (Signed)
NAMEAADHIRA, HEFFERNAN          ACCOUNT NO.:  [id number]  MEDICAL RECORD NO.:  [id number]           PATIENT TYPE:  E  LOCATION:  [facility]                         FACILITY:  [facility]  PHYSICIAN:  Thad Ranger, MD       DATE OF BIRTH:  [birth year]  DATE OF ADMISSION:  09/10/2010 DATE OF DISCHARGE:                             HISTORY & PHYSICAL   PRIMARY CARE PHYSICIAN:  Sean A. Everardo All, M.D.  CARDIOLOGIST:  Jesse Sans. Daleen Squibb, MD, [facility]  PULMONOLOGIST:  Barbaraann Share, MD,FCCP  CHIEF COMPLAINT:  Chest pain with shortness of breath.  HISTORY OF PRESENT ILLNESS:  Ms. Friis is a 77 year old female with multiple medical problems including coronary disease, history of COPD/asthma, chronic diastolic CHF, hypertension, diabetes who presented to the [facility] Emergency Room with a chief complaint of shortness of breath and chest pain.  History was obtained from the patient who stated that this chest pain started 3 days ago, which she describes as a heaviness and started on Friday when she was doing some housework and did improve resting.  She does also have some pleuritic component of the chest pain, and it gets worse on deep breathing.  She took some nitroglycerin pills at home, which eased some of the pain.  She states that the chest pain also improved when she laid down and also used her oxygen with the CPAP.  The shortness of breath started about a week ago and has been gradually getting worse.  The patient did see her PCP on March 24, and the patient was started on albuterol inhaler and doxycycline.  She also states that the Lasix was recently increased to 120 mg b.i.d.  She states that she also noticed lower extremity swelling.  However, left worse than right for over a month.  She has some dizziness and lightheadedness, but no syncopal episode.  She also complains of some abdominal spasms with a cough, but no nausea, vomiting, diarrhea, or constipation.  She denies any recent air  travels or long distance travels, or any swelling of her calves.  REVIEW OF SYSTEMS:  Pertinent positives as dictated above.  Otherwise negative.  PAST MEDICAL HISTORY: 1. History of coronary disease, nonobstructing per the cardiac     catheterization in January 2010.  The patient did have few     visibility 60-70% lesion in the proximal RCA and also has severe     hypertension. 2. Chronic diastolic CHF with EF of 65% on echocardiogram in June     2010. 3. History of obesity. 4. Hypertension, severe, poorly controlled. 5. Hyperlipidemia. 6. Diabetes mellitus on insulin. 7. History of asthma/COPD. 8. Obstructive sleep apnea on CPAP. 9. Osteoarthritis. 10.Diabetic retinopathy and neuropathy. 11.Peripheral vascular disease. 12.Depression. 13.History of subdural hemorrhage with craniotomy.  PAST SURGICAL HISTORY: 1. Cholecystectomy. 2. Bilateral tubal ligation. 3. Left knee arthroscopy. 4. Cervical and lumbar spine surgery. 5. Bilateral cataract surgery.  ALLERGIES:  To CONTRAST MEDIA, to AMLODIPINE, to IODINE.  SOCIAL HISTORY:  The patient denies any smoking, alcohol or any drug use.  She currently lives at home and is ambulatory and independent with her ADLs.  MEDICATIONS PRIOR TO ADMISSION:  1. Metolazone 5 mg p.o. b.i.d. 2. Lasix 80 mg 1.5 tablets twice daily. 3. Isosorbide 30 mg p.o. b.i.d. 4. Hydralazine 25 mg p.o. t.i.d. 5. Crestor 40 mg p.o. daily. 6. Albuterol inhaler 2 puffs every 6 hours as needed. 7. Latanoprost 0.005% ophthalmic both eyes, 1 drop daily at bedtime. 8. Tramadol 50 mg p.o. every 6 hours as needed for pain. 9. Potassium 20 mEq p.o. t.i.d. 10.Doxycycline 100 mg p.o. b.i.d. 11.Hydrocortone/APAP 10/225 mg, 0.5 to 1 tablet every 4 hours as     needed. 12.Nitroglycerin 0.4 sublingual every 5 minutes as needed for the     chest pain. 13.Labetalol 200 mg p.o. b.i.d. 14.Advair Diskus 100/50, 1 puff every 12 hours. 15.Humulin N 200 units in a.m. and  40 units of night. 16.Zetia 10 mg p.o. daily. 17.Aspirin 325 mg p.o. daily.  PHYSICAL EXAM:  VITAL SIGNS: At the time of triage, blood pressure was 174/90, improved to 130/69, pulse rate 88, respiratory rate 20, temperature 98.7. GENERAL:  The patient is alert, awake and oriented x3 in no acute distress. HEENT:  Pink conjunctivae.  Pupils reactive to light and accommodation. EOMI. NECK:  Supple.  No lymphadenopathy. CVS: S1, S2 clear.  Regular rate and rhythm. CHEST:  Bibasilar crackles without wheezing on expiration. ABDOMEN:  Obese, soft, nontender, normal bowel sounds. EXTREMITIES: Left lower leg edema worse than right. NEURO:  Alert and oriented x3. Cranial II through XII intact.  Motor strength intact in all extremities.  LAB DIAGNOSTIC DATA:  CBC:  White count 8.0, hemoglobin 16.3, hematocrit 50.6, platelets 202.  BMET:  Sodium 139, potassium 3.7, BUN 24, creatinine 1.4 last creatinine on February 29 was 1.7, calcium 8.9. ABG: pH 7.4, pCO2 37.6, CO2 61, bicarbonate 27, D-dimer elevated at 0.91, BNP 394, CK 313, MB 8.32 and index 2.7, troponin 0.16.  EKG:  Rate of 77 with T-wave inversions V3 to V6.  Chest x-ray, two-view: Cardiomegaly without any acute disease.  No focal airspace disease or effusion.  IMPRESSION AND PLAN:  Ms. Royse is a 77 year old female with multiple medical problems including nonobstructive coronary artery disease, chronic diastolic congestive heart failure, diabetes, poorly controlled hypertension, history of chronic obstructive pulmonary disease who presents with atypical chest pain with some pleuritic component and acute-on-chronic diastolic congestive heart failure exacerbation and lower extremity edema.  The patient also, however, has slightly positive troponin with elevated D-dimer.  The patient will be admitted to [facility]. 1. Atypical chest pain with acute-on-chronic diastolic congestive     heart failure exacerbation.   Given that the patient has elevated     BNP as well as elevated troponin, cardiology consultation was     obtained, and the patient was seen by Dr. Jens Som in the emergency     room.  Per the recommendation, troponin was elevated possibly     secondary to the volume overload per cardiology's opinion rather     than acute ACS.  Per recommendations, start on IV Lasix diuresis     and continue all the cardiac medications, strict Is and Os and     daily weights, and outpatient Myoview stress test.  Cardiology will     be following the patient closely. 2. Peripheral edema with asymmetrical left lower extremity edema.     Possible component from acute-on-chronic diastolic congestive heart     failure.  However, given the positive D-dimer and asymmetrical     lower extremity edema, I will rule out left lower extremity deep  venous thrombosis.  I will also obtain VQ scan as the patient is     allergic to CONTRAST DYE to rule out any pulmonary embolism given     her pleuritic chest pain.  The patient will be started on Lovenox     per pharmacy until pulmonary embolism/deep venous thrombosis are     ruled out. 3. Acute bronchitis with chronic obstructive pulmonary disease     exacerbation.  The patient will be placed on nebulizer treatments.     She had some improvement in the dyspnea and chest pain with the     nebulizer treatments in the emergency room.  She will be placed on     scheduled albuterol nebulizers with Advair Diskus and steroids.     She will likely need home O2 evaluation at the time of the     discharge.  If any worsening, pulmonology consultation can be     obtained.  The patient follows with Dr. Marcelyn Bruins. 4. Diabetes mellitus.  Obtain HbA1c and start on moderate sliding     scale with Lantus.  The patient takes very high dose of NPH insulin     at home. 5. Hypertension, poorly controlled.  Restart all home medication. 6. Acute kidney injury.  It appears that the  patient's creatinine     function was 1.7 on August 15, 2010 and is actually improving     today 1.4.  However, we will cautiously monitoring given high dose     IV diuresis. 7. History of peripheral vascular disease.  Continue aspirin. 8. Obstructive sleep apnea.  Continue CPAP at the home settings. 9. Prophylaxis.  Currently on full-dose Lovenox until deep venous     thrombosis and pulmonary embolism are ruled out.  This patient will be followed by [facility].     Thad Ranger, MD     RR/MEDQ  D:  09/10/2010  T:  09/10/2010  Job:  [id number]  cc:   Gregary Signs A. Everardo All, MD [address] [place] [address]  Barbaraann Share, MD,FCCP [address] [place] [address]  Madolyn Frieze. Jens Som, MD, [facility] [address]  [address] [place] [address]  Electronically Signed by Andres Labrum Kirstin Kugler  on 09/11/2010 03:50:45 PM

## 2010-09-12 DIAGNOSIS — I509 Heart failure, unspecified: Secondary | ICD-10-CM

## 2010-09-12 LAB — GLUCOSE, CAPILLARY
Glucose-Capillary: 485 mg/dL — ABNORMAL HIGH (ref 70–99)
Glucose-Capillary: 397 mg/dL — ABNORMAL HIGH (ref 70–99)
Glucose-Capillary: 156 mg/dL — ABNORMAL HIGH (ref 70–99)
Glucose-Capillary: 78 mg/dL (ref 70–99)

## 2010-09-12 LAB — CBC
HCT: 44.5 % (ref 36.0–46.0)
Hemoglobin: 14.1 g/dL (ref 12.0–15.0)
MCH: 27.7 pg (ref 26.0–34.0)
MCV: 87.4 fL (ref 78.0–100.0)
Platelets: 213 10*3/uL (ref 150–400)
RBC: 5.09 MIL/uL (ref 3.87–5.11)

## 2010-09-12 LAB — BASIC METABOLIC PANEL
BUN: 71 mg/dL — ABNORMAL HIGH (ref 6–23)
CO2: 25 mEq/L (ref 19–32)
Chloride: 99 mEq/L (ref 96–112)
Creatinine, Ser: 2.11 mg/dL — ABNORMAL HIGH (ref 0.4–1.2)
Glucose, Bld: 331 mg/dL — ABNORMAL HIGH (ref 70–99)

## 2010-09-13 ENCOUNTER — Inpatient Hospital Stay (HOSPITAL_COMMUNITY): Payer: Medicare Other

## 2010-09-13 LAB — BASIC METABOLIC PANEL
CO2: 28 mEq/L (ref 19–32)
Calcium: 7.8 mg/dL — ABNORMAL LOW (ref 8.4–10.5)
Chloride: 103 mEq/L (ref 96–112)
Glucose, Bld: 180 mg/dL — ABNORMAL HIGH (ref 70–99)
Sodium: 140 mEq/L (ref 135–145)

## 2010-09-13 LAB — CARDIAC PANEL(CRET KIN+CKTOT+MB+TROPI)
CK, MB: 10.4 ng/mL (ref 0.3–4.0)
CK, MB: 9.6 ng/mL (ref 0.3–4.0)
Relative Index: 3.5 — ABNORMAL HIGH (ref 0.0–2.5)
Total CK: 298 U/L — ABNORMAL HIGH (ref 7–177)
Troponin I: 0.07 ng/mL — ABNORMAL HIGH (ref 0.00–0.06)

## 2010-09-13 LAB — GLUCOSE, CAPILLARY
Glucose-Capillary: 146 mg/dL — ABNORMAL HIGH (ref 70–99)
Glucose-Capillary: 79 mg/dL (ref 70–99)

## 2010-09-14 DIAGNOSIS — R079 Chest pain, unspecified: Secondary | ICD-10-CM

## 2010-09-14 LAB — BASIC METABOLIC PANEL
Calcium: 7.9 mg/dL — ABNORMAL LOW (ref 8.4–10.5)
GFR calc Af Amer: 38 mL/min — ABNORMAL LOW (ref >60)
GFR calc non Af Amer: 32 mL/min — ABNORMAL LOW (ref >60)
Potassium: 3.4 mEq/L — ABNORMAL LOW (ref 3.5–5.1)
Sodium: 136 mEq/L (ref 135–145)

## 2010-09-14 LAB — EXPECTORATED SPUTUM ASSESSMENT W GRAM STAIN, RFLX TO RESP C

## 2010-09-14 LAB — CARDIAC PANEL(CRET KIN+CKTOT+MB+TROPI)
CK, MB: 7.7 ng/mL (ref 0.3–4.0)
Relative Index: 3.8 — ABNORMAL HIGH (ref 0.0–2.5)
Total CK: 204 U/L — ABNORMAL HIGH (ref 7–177)
Troponin I: 0.08 ng/mL — ABNORMAL HIGH (ref 0.00–0.06)

## 2010-09-14 LAB — MAGNESIUM: Magnesium: 2.2 mg/dL (ref 1.5–2.5)

## 2010-09-14 LAB — GLUCOSE, CAPILLARY

## 2010-09-15 LAB — BRAIN NATRIURETIC PEPTIDE: Pro B Natriuretic peptide (BNP): 59.1 pg/mL (ref 0.0–100.0)

## 2010-09-15 LAB — GLUCOSE, CAPILLARY
Glucose-Capillary: 153 mg/dL — ABNORMAL HIGH (ref 70–99)
Glucose-Capillary: 109 mg/dL — ABNORMAL HIGH (ref 70–99)

## 2010-09-15 LAB — BASIC METABOLIC PANEL
BUN: 31 mg/dL — ABNORMAL HIGH (ref 6–23)
CO2: 30 mEq/L (ref 19–32)
Chloride: 104 mEq/L (ref 96–112)
Creatinine, Ser: 1.43 mg/dL — ABNORMAL HIGH (ref 0.4–1.2)
Potassium: 3.9 mEq/L (ref 3.5–5.1)

## 2010-09-16 LAB — GLUCOSE, CAPILLARY
Glucose-Capillary: 75 mg/dL (ref 70–99)
Glucose-Capillary: 160 mg/dL — ABNORMAL HIGH (ref 70–99)
Glucose-Capillary: 91 mg/dL (ref 70–99)

## 2010-09-16 LAB — CULTURE, RESPIRATORY W GRAM STAIN: Culture: NORMAL

## 2010-09-17 ENCOUNTER — Inpatient Hospital Stay (HOSPITAL_COMMUNITY): Payer: Medicare Other

## 2010-09-17 LAB — COMPREHENSIVE METABOLIC PANEL
AST: 27 U/L (ref 0–37)
Albumin: 3.3 g/dL — ABNORMAL LOW (ref 3.5–5.2)
BUN: 23 mg/dL (ref 6–23)
Calcium: 8.7 mg/dL (ref 8.4–10.5)
Creatinine, Ser: 1.41 mg/dL — ABNORMAL HIGH (ref 0.4–1.2)
GFR calc Af Amer: 44 mL/min — ABNORMAL LOW (ref >60)

## 2010-09-17 LAB — CBC
MCH: 28.4 pg (ref 26.0–34.0)
MCHC: 31.3 g/dL (ref 30.0–36.0)
MCV: 90.9 fL (ref 78.0–100.0)
Platelets: 252 10*3/uL (ref 150–400)
RBC: 5.17 MIL/uL — ABNORMAL HIGH (ref 3.87–5.11)
RDW: 13.9 % (ref 11.5–15.5)

## 2010-09-17 LAB — GLUCOSE, CAPILLARY
Glucose-Capillary: 64 mg/dL — ABNORMAL LOW (ref 70–99)
Glucose-Capillary: 74 mg/dL (ref 70–99)
Glucose-Capillary: 231 mg/dL — ABNORMAL HIGH (ref 70–99)
Glucose-Capillary: 215 mg/dL — ABNORMAL HIGH (ref 70–99)
Glucose-Capillary: 238 mg/dL — ABNORMAL HIGH (ref 70–99)
Glucose-Capillary: 180 mg/dL — ABNORMAL HIGH (ref 70–99)
Glucose-Capillary: 103 mg/dL — ABNORMAL HIGH (ref 70–99)
Glucose-Capillary: 68 mg/dL — ABNORMAL LOW (ref 70–99)

## 2010-09-17 LAB — PHOSPHORUS: Phosphorus: 2.4 mg/dL (ref 2.3–4.6)

## 2010-09-18 NOTE — Discharge Summary (Signed)
NAMEMARGERY, [name]          ACCOUNT NO.:  [id number]  MEDICAL RECORD NO.:  [id number]           PATIENT TYPE:  I  LOCATION:  1403                         FACILITY:  [facility]  PHYSICIAN:  Conley Canal, MD      DATE OF BIRTH:  1929-09-28  DATE OF ADMISSION:  09/10/2010 DATE OF DISCHARGE:  09/17/2010                         DISCHARGE SUMMARY-REFERRING   PRIMARY CARE PHYSICIAN:  Sean A. Everardo All, MD  CARDIOLOGIST:  Jesse Sans. Daleen Squibb, MD, [facility]  PULMONOLOGIST:  Barbaraann Share, MD, FCCP  DISCHARGE DIAGNOSES: 1. Acute on chronic diastolic congestive heart failure, ejection     fraction 50-55% in this admission with no wall motion     abnormalities. 2. Acute coronary syndrome with elevated cardiac markers thought to be     secondary to congestive heart failure decompensation. 3. Chronic obstructive pulmonary disease exacerbation. 4. Diabetes mellitus type 2. 5. Acute renal failure related to acute on chronic diastolic     congestive heart failure. 6. Hypertension. 7. Nonobstructive coronary artery disease. 8. Hyperlipidemia. 9. Obstructive sleep apnea. 10.Peripheral vascular disease, diabetic retinopathy, diabetic     nephropathy stage III, chronic kidney disease, constipation.  CONSULTING PHYSICIANS:  Dr. Olga Millers with [facility].  DISCHARGE MEDICATIONS: 1. Advair 100/50 q.12 hourly. 2. Albuterol inhalations every 6 hours as needed. 3. Aspirin 325 mg daily. 4. Crestor 40 mg at bedtime. 5. Humulin N NPH sliding scale twice daily as prescribed by the     patient's primary care provider. 6. Hydralazine 25 mg three times daily. 7. Vicodin 10/325 mg every 4 hours as needed.  No new prescription     given. 8. Isordil 30 mg twice daily. 9. Labetalol 200 mg twice daily. 10.Lasix 120 mg twice daily. 11.Latanoprost 0.005% one drop in both eyes at bedtime. 12.Metolazone 5 mg twice daily. 13.Nitroglycerin 0.4 mg every 5 minutes as needed. 14.KCl 20 mEq three times  daily while on Lasix. 15.Tramadol 50 mg every 6 hours as needed. 16.Zetia 10 mg daily.  PROCEDURES PERFORMED: 1. Abdominal x-ray on September 17, 2010, showed no acute findings. 2. Chest x-ray on September 13, 2010, showed cardiac enlargement with     increased pulmonary vascular congestion and progression of     bibasilar atelectasis. 3. V/Q scan on September 10, 2010, showed no evidence of pulmonary     embolism, some slight air trapping in the lung bases. 4. A 2-D echocardiogram on September 12, 2010, which showed ejection     fraction 50-55%, some mild left ventricular hypertrophy and grade 1     diastolic dysfunction.  No significant valvular abnormalities.  HOSPITAL COURSE:  This pleasant 77 year old female came to the hospital with complaints of chest pain and shortness of breath.  She was found to be in acute decompensation of CHF with a BNP of 394 at admission.  Her D- dimer was also elevated at 0.91, raising concern for PE and V/Q scan subsequently suggested no PE.  Troponins were also positive at 0.16 at the time of admission, hence Cardiology consultation.  The patient was seen by Dr. Jens Som, whose impression was acute decompensation of CHF and possible upper respiratory tract infection and his thought  was that the elevated troponins were most likely secondary to congestive heart failure and renal insufficiency.  The patient was empirically placed on Avelox for the respiratory tract infection.  She will complete course of antibiotics before discharge.  Today, she is clinically better, however, earlier this morning, the patient had episodes of vomiting and abdominal pain, prompting an abdominal x-ray which was normal and also checking amylase and lipase which were also normal.  Her labs were unremarkable with WBC 6.6, hemoglobin 14.7, hematocrit 47, platelet count 252. Sodium 139, potassium 4, BUN 33, creatinine 1.41, glucose 226.  LFTs essentially normal.  Magnesium level 1.7.  The  patient is being discharged to follow with her primary care provider in the next 1-2 weeks and with Cardiology as previously scheduled.  She was given CHF discharge instruction sheet and discharged in stable condition to continue physical therapy at home with help of advanced home care.  The time spent for this discharge impression is less than 30 minutes.     Conley Canal, MD     SR/MEDQ  D:  09/17/2010  T:  09/17/2010  Job:  [id number]  cc:   Gregary Signs A. Everardo All, MD [address] [place] [address]  Jesse Sans. Wall, MD, FACC [address] [place] [address]  Barbaraann Share, MD,FCCP [address] [place] [address]  Madolyn Frieze. Jens Som, MD, [facility] [address] [place] [address]  Electronically Signed by Conley Canal  on 09/18/2010 06:09:41 PM

## 2010-09-24 LAB — GLUCOSE, CAPILLARY
Glucose-Capillary: 217 mg/dL — ABNORMAL HIGH (ref 70–99)
Glucose-Capillary: 280 mg/dL — ABNORMAL HIGH (ref 70–99)
Glucose-Capillary: 289 mg/dL — ABNORMAL HIGH (ref 70–99)

## 2010-09-24 LAB — BASIC METABOLIC PANEL
BUN: 12 mg/dL (ref 6–23)
BUN: 28 mg/dL — ABNORMAL HIGH (ref 6–23)
CO2: 27 mEq/L (ref 19–32)
Chloride: 97 mEq/L (ref 96–112)
Creatinine, Ser: 1.54 mg/dL — ABNORMAL HIGH (ref 0.4–1.2)
GFR calc non Af Amer: 45 mL/min — ABNORMAL LOW (ref >60)
Glucose, Bld: 261 mg/dL — ABNORMAL HIGH (ref 70–99)
Glucose, Bld: 329 mg/dL — ABNORMAL HIGH (ref 70–99)
Potassium: 3.6 mEq/L (ref 3.5–5.1)

## 2010-09-24 LAB — CBC
HCT: 44.6 % (ref 36.0–46.0)
MCHC: 33 g/dL (ref 30.0–36.0)
MCV: 87.6 fL (ref 78.0–100.0)
MCV: 88.2 fL (ref 78.0–100.0)
Platelets: 190 10*3/uL (ref 150–400)
Platelets: 205 10*3/uL (ref 150–400)
RDW: 14.3 % (ref 11.5–15.5)

## 2010-09-24 LAB — CARDIAC PANEL(CRET KIN+CKTOT+MB+TROPI): Troponin I: 0.07 ng/mL — ABNORMAL HIGH (ref 0.00–0.06)

## 2010-09-24 LAB — LIPID PANEL
Cholesterol: 191 mg/dL (ref 0–200)
LDL Cholesterol: 117 mg/dL — ABNORMAL HIGH (ref 0–99)
Total CHOL/HDL Ratio: 3.7 RATIO
Triglycerides: 109 mg/dL (ref <150)

## 2010-09-24 LAB — HEPARIN LEVEL (UNFRACTIONATED)
Heparin Unfractionated: 0.48 IU/mL (ref 0.30–0.70)
Heparin Unfractionated: 0.81 IU/mL — ABNORMAL HIGH (ref 0.30–0.70)

## 2010-09-24 LAB — PROTIME-INR
INR: 1 (ref 0.00–1.49)
Prothrombin Time: 12.8 seconds (ref 11.6–15.2)

## 2010-09-24 LAB — HEMOGLOBIN A1C: Hgb A1c MFr Bld: 10 % — ABNORMAL HIGH (ref 4.6–6.1)

## 2010-09-24 LAB — APTT: aPTT: 34 seconds (ref 24–37)

## 2010-09-24 NOTE — Progress Notes (Signed)
[name], [name]          ACCOUNT NO.:  [id number]  MEDICAL RECORD NO.:  [id number]           PATIENT TYPE:  I  LOCATION:  1403                         [facility]ACILITY[facility]  [facility]  PHYSICIAN:  Hillery Aldo, M.D.   DATE OF BIRTH:  April 02, 1930                                PROGRESS NOTE   PRIMARY CARE PHYSICIAN: Sean A. Everardo All, M.D.  CARDIOLOGIST: Jesse Sans. Wall, MD, [facility].  PULMONOLOGIST: Barbaraann Share, MD,FCCP.  CURRENT DIAGNOSES: 1. Acute-on-chronic diastolic congestive heart failure. 2. Acute coronary syndrome with elevated cardiac markers. 3. Chronic obstructive pulmonary disease with acute exacerbation. 4. Bronchitis. 5. Uncontrolled type 2 diabetes. 6. Acute renal failure.  SECONDARY DIAGNOSES: 1. Dyslipidemia. 2. Nonobstructive coronary artery disease. 3. Hypertension. 4. Obstructive sleep apnea 5. Peripheral vascular disease. 6. Diabetic retinopathy 7. Diabetic nephropathy 8. Stage III chronic kidney disease. 9. Constipation.  CONSULTATIONS: Madolyn Frieze. Jens Som, MD, [facility], of Cardiology.  BRIEF ADMISSION HISTORY OF PRESENT ILLNESS: The patient is a 77 year old female with past medical history of diastolic heart failure as well as coronary artery disease and asthma who presented to the hospital with a chief complaint of progressive dyspnea accompanied by chest pain.  The patient's chest pain began 3 days prior to presentation and was described as a heaviness that was exertional in nature.  There is also some pleuritic components with worsening pain on deep inspiration.  She took nitroglycerin at home, which ease the pain, but became concerned when she became dizzy, light- headed, and progressively weak.  She subsequently presented to the hospital for further evaluation and workup.  Upon initial evaluation, she was found to have an elevated D-dimer, and elevated BNP, and findings consistent with decompensated heart failure and so was referred to the  hospitalist service for further evaluation and treatment.  For full details, please see the dictated report done by Dr. Isidoro Donning.  PROCEDURES AND DIAGNOSTIC STUDIES: 1. Chest x-ray on September 10, 2010, showed cardiomegaly without acute     disease. 2. V/Q scan on September 10, 2010, showed no evidence of pulmonary emboli.     Slight air trapping at the lung bases. 3. Chest x-ray on September 13, 2010, showed cardiac enlargement with     increasing pulmonary vascular congestion and progression of     bibasilar atelectasis. 4. Two-dimensional echocardiogram on September 12, 2010, showed mild LVH,     normal systolic function with an ejection fraction estimated 50% to     55% and grade 1 diastolic dysfunction.  DISCHARGE LABORATORY VALUES: Will be dictated time of actual discharge.  HOSPITAL COURSE BY PROBLEM: 1. Acute-on-chronic diastolic congestive heart failure:  The patient     was admitted and cardiology consultation was requested and kindly     provided by Dr. Jens Som.  The patient did undergo a 2-dimensional     echocardiography with systolic function found to be normal and an     ejection fraction estimated at 50% to 55%.  There was evidence of     grade 1 diastolic dysfunction. The patient's diuresis was     complicated by the development of worsening renal insufficiency.     Her BNP values were  monitored serially and her diuretics adjusted     based on her renal function and need for further diuresis.  At this     point, the patient is maximally diuresed with her renal function     back to baseline and her BNP completely normalize.  She feels     better.  She is currently well compensated. 2. Acute coronary syndrome with increased cardiac markers:  The     patient did develop chest pain on September 10, 2010, prompting [place] to     order serial cardiac markers.  She had elevation of her cardiac     markers with her troponin reaching a maximal value of 0.17 before     trending down.  The patient was  transiently put on higher-dose     Lovenox by her cardiologist and was monitored closely.  This was     felt that her transient elevation of cardiac markers, which likely     due to CHF in the setting of demand ischemia.  She has been     maintained on aspirin therapy throughout her hospital stay and is     being treated with maximal medical therapy. 3. Chronic obstructive pulmonary disease with acute     exacerbation/bronchitis:  The patient did have bronchospasm on     initial presentation and she was transiently treated with steroids.     She had a cough productive of thick green sputum and sputum     cultures were sent, which was subsequently negative, although the     patient was already on antibiotics at the time the culture was     sent.  She is currently on Avelox, day 5, and of a planned course     of 7 days of therapy.  We continue on bronchodilator therapy as     well as Advair and her bronchitis and COPD exacerbation is     resolving. 4. Uncontrolled type 2 diabetes:  The patient required high doses of     insulin with meal coverage and Lantus to control her blood     glucoses.  At this point, she has had a low blood sugar and we are     decreasing her Lantus to address this.  We will continue to monitor     her monitor her sugars closely while she is in the hospital and     adjust her insulin as needed. 5. Acute renal failure in the setting of stage III chronic kidney     disease:  The patient did develop acute renal failure secondary to     over diuresis with a creatinine reaching a maximum value of 2.26.     Lasix was subsequently held and her creatinine is now back to     baseline.  Lasix has been resumed with no appreciable bump in her     creatinine.  She does have underlying stage III chronic kidney     disease with baseline creatinine of 1.4 to 1.5.  The patient's other chronic medical problems have remained stable.  DISPOSITION: The patient is not yet medically  stable for discharge and is somewhat deconditioned.  We are obtaining a formal PT and OT evaluations with recommendations, so that we can determine the appropriate disposition for her.  She will likely return home with physical therapy or go to a short-term skilled nursing facility for rehab if deemed appropriate.  Discharge summary addendum will be dictated at the time of actual discharge.  Hillery Aldo, M.D.     CR/MEDQ  D:  09/15/2010  T:  09/15/2010  Job:  [id number]  cc:   Gregary Signs A. Everardo All, MD [address] [place] [address]  Jesse Sans. Wall, MD, FACC [address] [place] [address]  Barbaraann Share, MD,FCCP [address] [place] [address]  Electronically Signed by Hillery Aldo M.D. on 09/24/2010 12:37:59 PM

## 2010-09-25 LAB — CBC
HCT: 48.7 % — ABNORMAL HIGH (ref 36.0–46.0)
Hemoglobin: 15.8 g/dL — ABNORMAL HIGH (ref 12.0–15.0)
MCHC: 32.4 g/dL (ref 30.0–36.0)
MCV: 87.7 fL (ref 78.0–100.0)
Platelets: 218 10*3/uL (ref 150–400)
RBC: 5.56 MIL/uL — ABNORMAL HIGH (ref 3.87–5.11)
RDW: 14.3 % (ref 11.5–15.5)
WBC: 8.2 K/uL (ref 4.0–10.5)

## 2010-09-25 LAB — DIFFERENTIAL
Basophils Absolute: 0.1 10*3/uL (ref 0.0–0.1)
Basophils Relative: 1 % (ref 0–1)
Eosinophils Absolute: 0.2 10*3/uL (ref 0.0–0.7)
Eosinophils Relative: 3 % (ref 0–5)
Lymphocytes Relative: 24 % (ref 12–46)
Lymphs Abs: 2 10*3/uL (ref 0.7–4.0)
Monocytes Absolute: 0.6 10*3/uL (ref 0.1–1.0)
Monocytes Relative: 7 % (ref 3–12)
Neutro Abs: 5.3 K/uL (ref 1.7–7.7)
Neutrophils Relative %: 65 % (ref 43–77)

## 2010-09-25 LAB — BASIC METABOLIC PANEL
BUN: 17 mg/dL (ref 6–23)
CO2: 30 mEq/L (ref 19–32)
GFR calc non Af Amer: 50 mL/min — ABNORMAL LOW (ref >60)
Glucose, Bld: 263 mg/dL — ABNORMAL HIGH (ref 70–99)
Potassium: 3.9 mEq/L (ref 3.5–5.1)

## 2010-09-25 LAB — D-DIMER, QUANTITATIVE: D-Dimer, Quant: 1.47 ug{FEU}/mL — ABNORMAL HIGH (ref 0.00–0.48)

## 2010-09-25 LAB — URINE MICROSCOPIC-ADD ON

## 2010-09-25 LAB — BASIC METABOLIC PANEL WITH GFR
Calcium: 9.2 mg/dL (ref 8.4–10.5)
Chloride: 101 meq/L (ref 96–112)
Creatinine, Ser: 1.07 mg/dL (ref 0.4–1.2)
GFR calc Af Amer: >60 mL/min (ref >60)
Sodium: 139 meq/L (ref 135–145)

## 2010-09-25 LAB — CK TOTAL AND CKMB (NOT AT ARMC)
CK, MB: 6.8 ng/mL — ABNORMAL HIGH (ref 0.3–4.0)
Relative Index: 3.3 — ABNORMAL HIGH (ref 0.0–2.5)
Total CK: 204 U/L — ABNORMAL HIGH (ref 7–177)

## 2010-09-25 LAB — URINALYSIS, ROUTINE W REFLEX MICROSCOPIC
Bilirubin Urine: NEGATIVE
Glucose, UA: >1000 mg/dL — AB
Hgb urine dipstick: NEGATIVE
Ketones, ur: NEGATIVE mg/dL
Leukocytes, UA: NEGATIVE
Nitrite: NEGATIVE
Protein, ur: 100 mg/dL — AB
Specific Gravity, Urine: 1.019 (ref 1.005–1.030)
Urobilinogen, UA: 0.2 mg/dL (ref 0.0–1.0)
pH: 6 (ref 5.0–8.0)

## 2010-09-25 LAB — BRAIN NATRIURETIC PEPTIDE: Pro B Natriuretic peptide (BNP): 183 pg/mL — ABNORMAL HIGH (ref 0.0–100.0)

## 2010-09-25 LAB — POCT CARDIAC MARKERS
CKMB, poc: 2 ng/mL (ref 1.0–8.0)
Myoglobin, poc: 93 ng/mL (ref 12–200)
Troponin i, poc: <0.05 ng/mL (ref 0.00–0.09)

## 2010-09-25 LAB — TROPONIN I: Troponin I: 0.09 ng/mL — ABNORMAL HIGH (ref 0.00–0.06)

## 2010-09-25 NOTE — Consult Note (Signed)
[name], [name]          ACCOUNT NO.:  [id number]  MEDICAL RECORD NO.:  [id number]           PATIENT TYPE:  I  LOCATION:  1239                         FACILITY:  [facility]  PHYSICIAN:  Madolyn Frieze. Jens Som, MD, FACCDATE OF BIRTH:  1929-09-11  DATE OF CONSULTATION:  09/10/2010 DATE OF DISCHARGE:                                CONSULTATION   PRIMARY CARDIOLOGIST:  Jesse Sans. Daleen Squibb, MD, [facility]  PRIMARY CARE PHYSICIAN:  Sean A. Everardo All, MD  NEPHROLOGIST:  Duke Salvia. Eliott Nine, MD  REASON FOR CONSULTATION:  Question of congestive heart failure/ACS.  HISTORY OF PRESENT ILLNESS:  [name] is a 77 year old African American female with a known history of diastolic congestive heart failure with an LVEF of 65% in June 2010 along with known nonobstructive coronary artery disease after multiple cardiac catheterizations, last one being completed in January 2010; obesity with BMI of 36; hypertension; hyperlipidemia; COPD; IDDM; obstructive sleep apnea, on CPAP; and history of subdural hematoma, requiring craniotomy and now presents with acute-on-chronic shortness of breath, orthopnea, abdominal and chest discomfort, productive cough, positive fever and chills, and weight gain of approximately 9 pounds.  The patient was in her usual state of health until 4 days ago when she started noticing fever and chills, significant worsening of chronic cough that was now productive with greenish yellowish phlegm, weight gain of a few pounds culminating a 9-pound weight gain today, abdominal and chest discomfort that is pleuritic, positional and not exertional. The patient saw her primary care physician a couple of days ago and she was given antibiotic (doxycycline) and albuterol inhaler.  There is some question as to whether or not her Lasix dose was increased at any rate, now she is on a 120 mg p.o. b.i.d.  Unfortunately, her symptoms continued resulting in her presenting to [facility]  for further evaluation.  In the emergency department, she was noted to have mild hypertension with systolic ranging from 132 to 174 and diastolic peak of 90.  Chest x-ray shows cardiomegaly without acute disease.  EKG shows no new changes compared to old tracing.  Her BNP is 394 and she has mild troponin elevation to 0.96 as well as well as mild MB elevation at 8.7, relative index of 2.7.  Her D-dimer is positive at 0.91, although she is not hypoxic on 2 L by nasal cannula and although she is on labetalol therapy at 400 mg p.o. b.i.d.  Her heart rate is stable in the 80s.  She is afebrile and CBC unremarkable.  BMET also unremarkable except for creatinine 1.41, which is approximately at baseline.  The patient continues to have symptoms currently of shortness of breath, orthopnea, no other complaints currently.  PAST MEDICAL HISTORY: 1. Diastolic CHF.     a.     2-D echocardiogram, November 16, 2008:  LVEF 65%, moderate LVH,      LAE mild. 2. Nonobstructive coronary artery disease (20% to 60% in largest     epicardial vessels, cath on July 04, 2008). 3. Hypertension. 4. Hyperlipidemia. 5. IDDM 6. COPD. 7. OSA, CPAP. 8. Obesity, BMI 36. 9. PVD. 10.Diabetic retinopathy and neuropathy. 11.CKD, stage 3. 12.Depression. 13.History  of subdural hematoma, requiring craniotomy. 14.S/p cholecystectomy. 15.S/p bilateral tubal ligation. 16.S/p bilateral cataract surgery. 17.Multiple back surgeries.  SOCIAL HISTORY:  The patient is a nonsmoker.  No significant EtOH or illicit drugs.  She generally follows diabetic and low-sodium diet with indiscretions occasionally.  No regular exercise.  FAMILY HISTORY:  Noncontributory secondary to patient's advanced age.  REVIEW OF SYSTEMS:  Please see HPI, all other systems were reviewed and were negative.  CODE STATUS:  Full.  ALLERGIES: 1. NORVASC. 2. IVP DYE (rash, itching).  MEDICATIONS: 1. KCl 20 mEq p.o. b.i.d. 2. Isosorbide dinitrate 30  mg p.o. b.i.d. 3. Advair 100/50 mcg 1 inhalation b.i.d. 4. Lasix 120 mg p.o. b.i.d. 5. Metolazone 10 mg p.o. daily. 6. Crestor 40 mg p.o. nightly. 7. Hydralazine 25 mg p.o. t.i.d. 8. Labetalol 400 mg p.o. b.i.d. 9. Zetia 10 mg p.o. daily. 10.ECASA 325 mg p.o. daily. 11.Sublingual nitroglycerin p.r.n. 12.Humulin insulin 200 units subcu injection q.a.m. 13.Hydrocodone/APAP 10/325 mg one-half to 1 tablet p.o. q.4 h. p.r.n. 14.Benzonatate 200 mg p.o. b.i.d. (new doxycycline and albuterol     inhaler p.r.n.).  PHYSICAL EXAMINATION:  VITAL SIGNS:  T-max 98.7 degrees Fahrenheit with BP ranging from 132 to 174 over 69 to 90 with pulse in the 80s and respiration rate 20, O2 saturation 100% on 2 L by nasal cannula. GENERAL:  The patient is alert, oriented x3, in no apparent stress.  She is able to speak easily in short sentences without respiratory distress. HEAD:  Normocephalic, atraumatic.  Pupils are equal, round, reactive to light.  Extraocular muscles are intact.  Nares are patent without discharge.  Dentition is poor.  Oropharynx without erythema or exudates. NECK:  Supple without lymphadenopathy.  No thyromegaly.  Difficult to assess for JVD, given body habitus. HEART:  Rate is regular with audible S1, S2.  No clicks, rubs, murmurs, or gallops.  Pulses are 2+ and equal in both upper and lower extremities bilaterally. LUNGS:  Mild crackles half way up bilaterally. SKIN:  No rashes, lesions, or petechiae. ABDOMEN:  Soft, nontender, nondistended.  Normal abdominal bowel sounds. No rebound or guarding.  No hepatosplenomegaly.  The patient is obese. EXTREMITIES:  1-2 plus pitting edema with left greater than right. MUSCULOSKELETAL:  Without joint deformity or effusions.  No spinal or CVA tenderness. NEURO:  Cranial II through XII grossly intact.  Strength 5/5 in all extremities and axial groups.  Normal sensation throughout.  Normal cerebellar function.  RADIOLOGY: 1. Chest x-ray  showed cardiomegaly without acute disease. 2. EKG, rhythm NSR with nonspecific changes and some inferolateral T-     wave inversions, it is not significant change from prior tracing     completed in June 2010 as well as November 2000.  LABORATORY DATA:  WBC is 8.0, HGB 16.3, HCT 50.6, PLT count is 202. Sodium 139, potassium 3.7, chloride 99, bicarb 30, BUN 24, creatinine 1.41, glucose 121.  BNP 394.  D-dimer 0.91.  CK 313.  MB 8.7, relative index 2.7, troponin I 0.16.  ABG shows metabolic alkalosis with a pH of 7.47, bicarb 27, acid base excess of 3.8, pO2 of 61.1, otherwise all values within normal limits.  ASSESSMENT/PLAN:  The patient seen by both Lenard Galloway, PA-C, and attending cardiologist, Dr. Olga Millers.  [name] is a 77 year old African American female with past medical history significant for insulin-dependent diabetes mellitus, hypertension, hyperlipidemia, chronic renal insufficiency, chronic diastolic congestive heart failure, nonobstructive coronary artery disease, chronic obstructive pulmonary disease, obstructive sleep apnea, and a subdural  hematoma who we are asked to evaluate for dyspnea and elevated cardiac markers.  The patient with change of dyspnea, increasing over the past 3-5 days. She complains of dyspnea on exertion, orthopnea, paroxysmal nocturnal dyspnea, pedal edema with weight gain approximately 9 pounds.  Also, reports fever, productive cough (greenish yellow sputum).  She complains of abdominal and chest tightness that has been continuous over the past 5 days that is clearly positional and pleuritic, but not exertional. BNP 394, D-dimer 0.91, CK-MB 8.7, and troponin of 0.16.  EKG sinus rhythm with if anything improved T-wave inversion from prior tracings.  Presentation is most consistent with congestive heart failure and upper respiratory infection.  We would diurese and follow renal function.  The patient needs antibiotics for upper  respiratory infection and breathing treatments for chronic obstructive pulmonary disease, per primary team.   Elevated troponin most likely secondary to congestive heart failure and renal insufficiency.  Would continue to cycle enzymes and follow trend.  Should there be no significant up trend, would likely risk stratify with outpatient Myoview.  Thanks for the consult.  We will continue to follow.     Jarrett Ables, PAC   ______________________________ Madolyn Frieze. Jens Som, MD, [facility]    MS/MEDQ  D:  09/10/2010  T:  09/11/2010  Job:  [id number]  Electronically Signed by Jarrett Ables PAC on 09/25/2010 11:04:07 AM Electronically Signed by Olga Millers MD [facility] on 09/25/2010 05:44:31 PM

## 2010-09-26 LAB — DIFFERENTIAL
Basophils Relative: 0 % (ref 0–1)
Basophils Relative: 0 % (ref 0–1)
Eosinophils Absolute: 0.1 10*3/uL (ref 0.0–0.7)
Eosinophils Relative: 1 % (ref 0–5)
Lymphs Abs: 0.7 10*3/uL (ref 0.7–4.0)
Monocytes Relative: 7 % (ref 3–12)
Neutro Abs: 6.7 10*3/uL (ref 1.7–7.7)
Neutrophils Relative %: 78 % — ABNORMAL HIGH (ref 43–77)

## 2010-09-26 LAB — POCT CARDIAC MARKERS
CKMB, poc: 3.2 ng/mL (ref 1.0–8.0)
Myoglobin, poc: 140 ng/mL (ref 12–200)

## 2010-09-26 LAB — URINALYSIS, ROUTINE W REFLEX MICROSCOPIC
Bilirubin Urine: NEGATIVE
Protein, ur: 100 mg/dL — AB
Urobilinogen, UA: 1 mg/dL (ref 0.0–1.0)

## 2010-09-26 LAB — GLUCOSE, CAPILLARY
Glucose-Capillary: 123 mg/dL — ABNORMAL HIGH (ref 70–99)
Glucose-Capillary: 123 mg/dL — ABNORMAL HIGH (ref 70–99)
Glucose-Capillary: 126 mg/dL — ABNORMAL HIGH (ref 70–99)
Glucose-Capillary: 225 mg/dL — ABNORMAL HIGH (ref 70–99)
Glucose-Capillary: 246 mg/dL — ABNORMAL HIGH (ref 70–99)
Glucose-Capillary: 249 mg/dL — ABNORMAL HIGH (ref 70–99)
Glucose-Capillary: 53 mg/dL — ABNORMAL LOW (ref 70–99)
Glucose-Capillary: 75 mg/dL (ref 70–99)

## 2010-09-26 LAB — HEMOGLOBIN A1C
Hgb A1c MFr Bld: 9.7 % — ABNORMAL HIGH (ref 4.6–6.1)
Mean Plasma Glucose: 232 mg/dL

## 2010-09-26 LAB — COMPREHENSIVE METABOLIC PANEL
ALT: 19 U/L (ref 0–35)
AST: 24 U/L (ref 0–37)
Alkaline Phosphatase: 49 U/L (ref 39–117)
Alkaline Phosphatase: 69 U/L (ref 39–117)
BUN: 13 mg/dL (ref 6–23)
CO2: 31 mEq/L (ref 19–32)
Calcium: 8.4 mg/dL (ref 8.4–10.5)
Calcium: 9.4 mg/dL (ref 8.4–10.5)
Chloride: 101 mEq/L (ref 96–112)
GFR calc Af Amer: 54 mL/min — ABNORMAL LOW (ref >60)
GFR calc non Af Amer: 44 mL/min — ABNORMAL LOW (ref >60)
Glucose, Bld: 219 mg/dL — ABNORMAL HIGH (ref 70–99)
Potassium: 3 mEq/L — ABNORMAL LOW (ref 3.5–5.1)
Potassium: 3.4 mEq/L — ABNORMAL LOW (ref 3.5–5.1)
Sodium: 140 mEq/L (ref 135–145)
Total Protein: 5.2 g/dL — ABNORMAL LOW (ref 6.0–8.3)

## 2010-09-26 LAB — LIPASE, BLOOD: Lipase: 26 U/L (ref 11–59)

## 2010-09-26 LAB — BASIC METABOLIC PANEL
CO2: 30 mEq/L (ref 19–32)
Calcium: 8.3 mg/dL — ABNORMAL LOW (ref 8.4–10.5)
Chloride: 101 mEq/L (ref 96–112)
GFR calc Af Amer: 37 mL/min — ABNORMAL LOW (ref >60)
Sodium: 138 mEq/L (ref 135–145)

## 2010-09-26 LAB — CBC
HCT: 40.7 % (ref 36.0–46.0)
Hemoglobin: 13.4 g/dL (ref 12.0–15.0)
Hemoglobin: 15.4 g/dL — ABNORMAL HIGH (ref 12.0–15.0)
MCHC: 32.9 g/dL (ref 30.0–36.0)
MCHC: 33.3 g/dL (ref 30.0–36.0)
RBC: 5.28 MIL/uL — ABNORMAL HIGH (ref 3.87–5.11)
RDW: 13.6 % (ref 11.5–15.5)
WBC: 9 10*3/uL (ref 4.0–10.5)

## 2010-09-27 LAB — COMPREHENSIVE METABOLIC PANEL
AST: 21 U/L (ref 0–37)
Albumin: 3.4 g/dL — ABNORMAL LOW (ref 3.5–5.2)
Calcium: 8.9 mg/dL (ref 8.4–10.5)
Chloride: 100 mEq/L (ref 96–112)
Creatinine, Ser: 1.48 mg/dL — ABNORMAL HIGH (ref 0.4–1.2)
GFR calc Af Amer: 41 mL/min — ABNORMAL LOW (ref >60)
Total Bilirubin: 1 mg/dL (ref 0.3–1.2)
Total Protein: 6 g/dL (ref 6.0–8.3)

## 2010-09-27 LAB — CBC
HCT: 42.5 % (ref 36.0–46.0)
Hemoglobin: 14.3 g/dL (ref 12.0–15.0)
MCV: 87.4 fL (ref 78.0–100.0)
MCV: 87.9 fL (ref 78.0–100.0)
Platelets: 224 10*3/uL (ref 150–400)
RBC: 4.83 MIL/uL (ref 3.87–5.11)
RDW: 13.6 % (ref 11.5–15.5)
WBC: 6.8 10*3/uL (ref 4.0–10.5)
WBC: 8.3 10*3/uL (ref 4.0–10.5)

## 2010-09-27 LAB — GLUCOSE, CAPILLARY: Glucose-Capillary: 366 mg/dL — ABNORMAL HIGH (ref 70–99)

## 2010-09-27 LAB — BASIC METABOLIC PANEL
Chloride: 97 mEq/L (ref 96–112)
GFR calc Af Amer: 41 mL/min — ABNORMAL LOW (ref >60)
GFR calc non Af Amer: 34 mL/min — ABNORMAL LOW (ref >60)
Potassium: 3.7 mEq/L (ref 3.5–5.1)
Sodium: 138 mEq/L (ref 135–145)

## 2010-09-27 LAB — DIFFERENTIAL
Basophils Absolute: 0 10*3/uL (ref 0.0–0.1)
Basophils Relative: 0 % (ref 0–1)
Eosinophils Absolute: 0.1 10*3/uL (ref 0.0–0.7)
Eosinophils Relative: 1 % (ref 0–5)
Metamyelocytes Relative: 0 %
Monocytes Absolute: 0.7 10*3/uL (ref 0.1–1.0)
Monocytes Relative: 8 % (ref 3–12)
Myelocytes: 0 %

## 2010-09-28 ENCOUNTER — Other Ambulatory Visit: Payer: Self-pay | Admitting: Endocrinology

## 2010-10-01 LAB — CBC
HCT: 38.2 % (ref 36.0–46.0)
Hemoglobin: 13.2 g/dL (ref 12.0–15.0)
Hemoglobin: 14.5 g/dL (ref 12.0–15.0)
Hemoglobin: 12.6 g/dL (ref 12.0–15.0)
Hemoglobin: 13.2 g/dL (ref 12.0–15.0)
MCHC: 32.7 g/dL (ref 30.0–36.0)
MCHC: 32.8 g/dL (ref 30.0–36.0)
MCHC: 33 g/dL (ref 30.0–36.0)
MCHC: 33.5 g/dL (ref 30.0–36.0)
MCV: 88.1 fL (ref 78.0–100.0)
MCV: 88.2 fL (ref 78.0–100.0)
MCV: 87.2 fL (ref 78.0–100.0)
Platelets: 211 10*3/uL (ref 150–400)
Platelets: 227 10*3/uL (ref 150–400)
RBC: 4.53 MIL/uL (ref 3.87–5.11)
RBC: 4.97 MIL/uL (ref 3.87–5.11)
RBC: 4.53 MIL/uL (ref 3.87–5.11)
RDW: 14.7 % (ref 11.5–15.5)
RDW: 15.1 % (ref 11.5–15.5)
RDW: 15 % (ref 11.5–15.5)
RDW: 15.3 % (ref 11.5–15.5)
WBC: 7.8 10*3/uL (ref 4.0–10.5)
WBC: 9.1 10*3/uL (ref 4.0–10.5)

## 2010-10-01 LAB — CK TOTAL AND CKMB (NOT AT ARMC)
CK, MB: 5.8 ng/mL — ABNORMAL HIGH (ref 0.3–4.0)
CK, MB: 7.6 ng/mL — ABNORMAL HIGH (ref 0.3–4.0)
Relative Index: 3.3 — ABNORMAL HIGH (ref 0.0–2.5)
Total CK: 171 U/L (ref 7–177)

## 2010-10-01 LAB — CULTURE, BLOOD (ROUTINE X 2): Culture: NO GROWTH

## 2010-10-01 LAB — BASIC METABOLIC PANEL
BUN: 20 mg/dL (ref 6–23)
BUN: 36 mg/dL — ABNORMAL HIGH (ref 6–23)
BUN: 46 mg/dL — ABNORMAL HIGH (ref 6–23)
BUN: 47 mg/dL — ABNORMAL HIGH (ref 6–23)
CO2: 27 mEq/L (ref 19–32)
CO2: 29 mEq/L (ref 19–32)
CO2: 27 mEq/L (ref 19–32)
Calcium: 8.3 mg/dL — ABNORMAL LOW (ref 8.4–10.5)
Calcium: 8.8 mg/dL (ref 8.4–10.5)
Calcium: 8.2 mg/dL — ABNORMAL LOW (ref 8.4–10.5)
Calcium: 8.2 mg/dL — ABNORMAL LOW (ref 8.4–10.5)
Calcium: 8.4 mg/dL (ref 8.4–10.5)
Chloride: 103 mEq/L (ref 96–112)
Chloride: 103 mEq/L (ref 96–112)
Chloride: 101 mEq/L (ref 96–112)
Chloride: 108 mEq/L (ref 96–112)
Creatinine, Ser: 1.02 mg/dL (ref 0.4–1.2)
Creatinine, Ser: 2.29 mg/dL — ABNORMAL HIGH (ref 0.4–1.2)
Creatinine, Ser: 2.44 mg/dL — ABNORMAL HIGH (ref 0.4–1.2)
GFR calc Af Amer: 57 mL/min — ABNORMAL LOW (ref >60)
GFR calc Af Amer: >60 mL/min (ref >60)
GFR calc non Af Amer: 19 mL/min — ABNORMAL LOW (ref >60)
GFR calc non Af Amer: 21 mL/min — ABNORMAL LOW (ref >60)
GFR calc non Af Amer: 23 mL/min — ABNORMAL LOW (ref >60)
Glucose, Bld: 168 mg/dL — ABNORMAL HIGH (ref 70–99)
Glucose, Bld: 122 mg/dL — ABNORMAL HIGH (ref 70–99)
Glucose, Bld: 172 mg/dL — ABNORMAL HIGH (ref 70–99)
Glucose, Bld: 191 mg/dL — ABNORMAL HIGH (ref 70–99)
Glucose, Bld: 429 mg/dL — ABNORMAL HIGH (ref 70–99)
Potassium: 3.2 mEq/L — ABNORMAL LOW (ref 3.5–5.1)
Potassium: 3.3 mEq/L — ABNORMAL LOW (ref 3.5–5.1)
Potassium: 3.5 mEq/L (ref 3.5–5.1)
Sodium: 139 mEq/L (ref 135–145)
Sodium: 139 mEq/L (ref 135–145)
Sodium: 137 mEq/L (ref 135–145)

## 2010-10-01 LAB — HEPATIC FUNCTION PANEL
ALT: 24 U/L (ref 0–35)
AST: 20 U/L (ref 0–37)
Albumin: 3.6 g/dL (ref 3.5–5.2)
Alkaline Phosphatase: 70 U/L (ref 39–117)
Total Bilirubin: 1 mg/dL (ref 0.3–1.2)
Total Protein: 6.1 g/dL (ref 6.0–8.3)

## 2010-10-01 LAB — COMPREHENSIVE METABOLIC PANEL
AST: 22 U/L (ref 0–37)
Albumin: 3.3 g/dL — ABNORMAL LOW (ref 3.5–5.2)
BUN: 17 mg/dL (ref 6–23)
CO2: 30 mEq/L (ref 19–32)
Calcium: 8.6 mg/dL (ref 8.4–10.5)
Calcium: 8.5 mg/dL (ref 8.4–10.5)
Chloride: 105 mEq/L (ref 96–112)
Creatinine, Ser: 1.33 mg/dL — ABNORMAL HIGH (ref 0.4–1.2)
Creatinine, Ser: 1.76 mg/dL — ABNORMAL HIGH (ref 0.4–1.2)
GFR calc Af Amer: 47 mL/min — ABNORMAL LOW (ref >60)
GFR calc Af Amer: 34 mL/min — ABNORMAL LOW (ref >60)
GFR calc non Af Amer: 28 mL/min — ABNORMAL LOW (ref >60)
Glucose, Bld: 203 mg/dL — ABNORMAL HIGH (ref 70–99)
Total Bilirubin: 1 mg/dL (ref 0.3–1.2)
Total Protein: 5.6 g/dL — ABNORMAL LOW (ref 6.0–8.3)
Total Protein: 5.9 g/dL — ABNORMAL LOW (ref 6.0–8.3)

## 2010-10-01 LAB — GLUCOSE, CAPILLARY
Glucose-Capillary: 139 mg/dL — ABNORMAL HIGH (ref 70–99)
Glucose-Capillary: 140 mg/dL — ABNORMAL HIGH (ref 70–99)
Glucose-Capillary: 218 mg/dL — ABNORMAL HIGH (ref 70–99)
Glucose-Capillary: 228 mg/dL — ABNORMAL HIGH (ref 70–99)
Glucose-Capillary: 397 mg/dL — ABNORMAL HIGH (ref 70–99)
Glucose-Capillary: 59 mg/dL — ABNORMAL LOW (ref 70–99)
Glucose-Capillary: 66 mg/dL — ABNORMAL LOW (ref 70–99)
Glucose-Capillary: 109 mg/dL — ABNORMAL HIGH (ref 70–99)
Glucose-Capillary: 198 mg/dL — ABNORMAL HIGH (ref 70–99)
Glucose-Capillary: 264 mg/dL — ABNORMAL HIGH (ref 70–99)
Glucose-Capillary: 355 mg/dL — ABNORMAL HIGH (ref 70–99)
Glucose-Capillary: 457 mg/dL — ABNORMAL HIGH (ref 70–99)
Glucose-Capillary: 78 mg/dL (ref 70–99)
Glucose-Capillary: 96 mg/dL (ref 70–99)

## 2010-10-01 LAB — PLATELET COUNT: Platelets: 248 10*3/uL (ref 150–400)

## 2010-10-01 LAB — PROTIME-INR: INR: 0.9 (ref 0.00–1.49)

## 2010-10-01 LAB — BRAIN NATRIURETIC PEPTIDE
Pro B Natriuretic peptide (BNP): 90.7 pg/mL (ref 0.0–100.0)
Pro B Natriuretic peptide (BNP): <30 pg/mL (ref 0.0–100.0)

## 2010-10-01 LAB — CARDIAC PANEL(CRET KIN+CKTOT+MB+TROPI)
Total CK: 250 U/L — ABNORMAL HIGH (ref 7–177)
Troponin I: 0.04 ng/mL (ref 0.00–0.06)

## 2010-10-01 LAB — APTT: aPTT: 40 seconds — ABNORMAL HIGH (ref 24–37)

## 2010-10-01 LAB — TROPONIN I: Troponin I: 0.06 ng/mL (ref 0.00–0.06)

## 2010-10-04 ENCOUNTER — Encounter: Payer: Self-pay | Admitting: Endocrinology

## 2010-10-04 ENCOUNTER — Ambulatory Visit (INDEPENDENT_AMBULATORY_CARE_PROVIDER_SITE_OTHER): Payer: Medicare Other | Admitting: Endocrinology

## 2010-10-04 ENCOUNTER — Other Ambulatory Visit (INDEPENDENT_AMBULATORY_CARE_PROVIDER_SITE_OTHER): Payer: Medicare Other

## 2010-10-04 VITALS — BP 138/76 | HR 78 | Temp 98.7°F | Ht 65.0 in | Wt 214.4 lb

## 2010-10-04 DIAGNOSIS — E119 Type 2 diabetes mellitus without complications: Secondary | ICD-10-CM

## 2010-10-04 LAB — HEMOGLOBIN A1C: Hgb A1c MFr Bld: 9.8 % — ABNORMAL HIGH (ref 4.6–6.5)

## 2010-10-04 NOTE — Patient Instructions (Addendum)
Just take the insulin in the morning only. blood tests are being ordered for you today.  please call [phone] to hear your test results. Please make a follow-up appointment in 3 months check your blood sugar 2 times a day.  vary the time of day when you check, between before the 3 meals, and at bedtime.  also check if you have symptoms of your blood sugar being too high or too low.  please keep a record of the readings and bring it to your next appointment here.  please call us sooner if you are having low blood sugar episodes. (update: i left message on phone-tree:  Increase nph insulin to 220 units qam only).

## 2010-10-04 NOTE — Progress Notes (Signed)
  Subjective:    Patient ID: [name], female    DOB: [birth year], 77 y.o.   MRN: [id number]  HPI no cbg record, but states cbg's are well-controlled, except it is sometimes low in the early hrs of the am.  She takes nph insulin, 200 units each am, and 40 qpm.  pt states she feels well in general. Past Medical History  Diagnosis Date  . Uterine cancer   . CHF (congestive heart failure)     diastolic  . Diabetes mellitus     type II  . Hypertension   . GERD (gastroesophageal reflux disease)   . Peripheral vascular disease   . Obesity   . Dyslipidemia   . DM type 2 with diabetic peripheral neuropathy   . Osteoarthritis cervical spine   . DM retinopathy   . Sleep apnea     with CPAP  . History of shingles   . COPD (chronic obstructive pulmonary disease)   . Asthma   . Depression   . Peptic ulcer disease     duodenal  . Peptic stricture of esophagus   . Hiatal hernia   . Diverticulosis   . Arthritis   . ASCVD (arteriosclerotic cardiovascular disease)     on MRI brin   Past Surgical History  Procedure Date  . Tubal ligation 1967  . Knee arthroscopy 10/1998    Left  . Craniotomy 1997    Left for SDH  . Cataract extraction, bilateral   . Hernia repair   . Esophagogastroduodenoscopy 04/04/2004  . Spine surgery     C-spine and lumbar surgery  . Cholecystectomy 2010  . Cardiac catheterization     reports that she has never smoked. She does not have any smokeless tobacco history on file. She reports that she drinks alcohol. Her drug history not on file. family history includes Asthma in her other; Cancer in her mother; Diabetes in her mother; Heart disease in her mother; Kidney disease in her paternal grandmother; and Lymphoma in her father. Allergies  Allergen Reactions  . Amlodipine Besylate   . Iohexol      Code: RASH, Desc: JENNIFER STATES ON PT'S CHART ALLERGIC TO IV DYE 09/04/07/RM, Onset Date: [id number]      Review of Systems Denies loc and sob      Objective:   Physical Exam GENERAL: no distress Pulses: dorsalis pedis intact bilat.   Feet: no deformity.  no ulcer on the feet.  feet are of normal color and temp.  There is 1+ bilat leg edema Neuro: sensation is intact to touch on the feet        Assessment & Plan:  Dm, therapy limited by noncompliance.  i'll do the best i can.

## 2010-10-05 ENCOUNTER — Encounter: Payer: Self-pay | Admitting: Physician Assistant

## 2010-10-08 ENCOUNTER — Ambulatory Visit (INDEPENDENT_AMBULATORY_CARE_PROVIDER_SITE_OTHER): Payer: Medicare Other | Admitting: Physician Assistant

## 2010-10-08 ENCOUNTER — Encounter: Payer: Self-pay | Admitting: Physician Assistant

## 2010-10-08 VITALS — BP 148/82 | HR 77 | Resp 18 | Ht 65.0 in | Wt 215.0 lb

## 2010-10-08 DIAGNOSIS — E785 Hyperlipidemia, unspecified: Secondary | ICD-10-CM

## 2010-10-08 DIAGNOSIS — N259 Disorder resulting from impaired renal tubular function, unspecified: Secondary | ICD-10-CM

## 2010-10-08 DIAGNOSIS — I5032 Chronic diastolic (congestive) heart failure: Secondary | ICD-10-CM

## 2010-10-08 DIAGNOSIS — I509 Heart failure, unspecified: Secondary | ICD-10-CM

## 2010-10-08 LAB — BASIC METABOLIC PANEL
Calcium: 9.5 mg/dL (ref 8.4–10.5)
GFR: 34.68 mL/min — ABNORMAL LOW (ref >60.00)
Glucose, Bld: 207 mg/dL — ABNORMAL HIGH (ref 70–99)
Potassium: 3.7 mEq/L (ref 3.5–5.1)
Sodium: 136 mEq/L (ref 135–145)

## 2010-10-08 NOTE — Assessment & Plan Note (Signed)
She is doing well.  Her symptoms are improved.  Her weight is stable.  Her edema is stable.  She does have renal insufficiency.  I will check a basic metabolic panel today to follow up on her renal function and potassium given her high dose diuretics.  She did have some elevated cardiac enzymes in the hospital.  This was all felt to be related to acute on chronic renal failure and congestive heart failure.  She denies any chest pain.  Continue current medications for now and follow up with Dr. Daleen Squibb in 3 months or sooner p.r.n.

## 2010-10-08 NOTE — Assessment & Plan Note (Signed)
Check BMET today 

## 2010-10-08 NOTE — Assessment & Plan Note (Addendum)
Followed by her PCP.  She is on high dose crestor and zetia.  Her LDL was 160 in the hospital.  She admits compliance with her meds.  Continue current therapy.

## 2010-10-08 NOTE — Patient Instructions (Addendum)
Your physician recommends that you schedule a follow-up appointment in: 3 MONTHS WITH DR. WALL AS PER SCOTT WEAVER,PA-C.  Your physician recommends that you return for lab work in: TODAY BMET 428.32, 588.9  Your physician recommends that you continue on your current medications as directed. Please refer to the Current Medication list given to you today.

## 2010-10-08 NOTE — Progress Notes (Signed)
History of Present Illness: Primary Cardiologist:  Dr. Valera Castle  [name] is a 77 y.o. female with a h/o diastolic CHF, nonobstructive CAD, DM2, HTN, HLP, sleep apnea and CKD was admitted to [facility] 3/26-4/2 with dyspnea and elevated CEs (peak troponin 0.17 and peak CK-MB 10.4).  BNP was elevated and she was seen by cardiology.  It was felt she had a/c diastolic CHF and her elevated enzymes were 2/2 CHF + CKD.  She did develop worsening creatine with diuresis and this was adjusted.  She was also treated with antibiotics to cover for COPD exacerbation.  She returns for follow up.  Of note, echo done during hosp. Demonstrated EF 50-55%, mild LVH and grade 1 diast. Dysfxn.    Labs: Na 139, K 4, Creat 1.41 (down from 2.26), Hgb 14.7; TC 240, TG 72, HDL 66, LDL 160, ALT 30. V/Q negative for pulmonary embolism. CXR: unremarkable.  She is doing well.  Her breathing is much improved.  She has chronic dyspnea with exertion and is probably class IIb to 3.  She denies orthopnea or PND.  Her lower extremity edema is stable.  Her left ankle is usually larger than her right.  She denies syncope.  She denies chest pain.  Her weight has been stable at home.  She denies cough or fevers.   Past Medical History  Diagnosis Date  . Uterine cancer   . Diabetes mellitus     type II; peripheral neuropathy  . Hypertension   . GERD (gastroesophageal reflux disease)   . Peripheral vascular disease   . Obesity   . Dyslipidemia   . Diastolic CHF, chronic     EF 50-55%, mild LVH and grade 1 diast. Dysfxn  . Osteoarthritis cervical spine   . DM retinopathy   . Sleep apnea     with CPAP  . History of shingles   . COPD (chronic obstructive pulmonary disease)   . Asthma   . Depression   . Peptic ulcer disease     duodenal  . Peptic stricture of esophagus   . Hiatal hernia   . Diverticulosis   . Arthritis   . ASCVD (arteriosclerotic cardiovascular disease)     on MRI brin  . CAD (coronary artery  disease)     a. s/p multiple caths with nonobs CAD;   b. cath 1/10: pLAD 20%, mLAD 40%, pCFX 20%, mCFX 40%, pRCA 60-70%    Current Outpatient Prescriptions  Medication Sig Dispense Refill  . ADVAIR DISKUS 100-50 MCG/DOSE AEPB USE ONE (1) PUFF(S) TWICE DAILY  60 each  2  . albuterol (VENTOLIN HFA) 108 (90 BASE) MCG/ACT inhaler Inhale 2 puffs into the lungs every 6 (six) hours as needed for wheezing.  1 Inhaler  0  . aspirin 325 MG tablet Take 325 mg by mouth daily.        Marland Kitchen ezetimibe (ZETIA) 10 MG tablet Take 10 mg by mouth daily.        . furosemide (LASIX) 80 MG tablet Take 80 mg by mouth 2 (two) times daily. TAKE 1 AND 1/2 TAB TWICE DAILY       . glucose blood (FREESTYLE TEST STRIPS) test strip 2x a day, dx 250.01      . hydrALAZINE (APRESOLINE) 25 MG tablet Take 25 mg by mouth 3 (three) times daily.        Marland Kitchen HYDROcodone-acetaminophen (NORCO) 10-325 MG per tablet Take 0.5-1 tablets by mouth every 4 (four) hours as needed.        Marland Kitchen  insulin NPH (HUMULIN N) 100 UNIT/ML injection Inject 220 Units into the skin daily before breakfast.       . isosorbide dinitrate (ISORDIL) 30 MG tablet Take 30 mg by mouth 2 (two) times daily.        Marland Kitchen labetalol (NORMODYNE) 200 MG tablet Take 200 mg by mouth 2 (two) times daily.        . metolazone (ZAROXOLYN) 5 MG tablet Take 5 mg by mouth 2 (two) times daily.        . nitroGLYCERIN (NITROSTAT) 0.4 MG SL tablet Place 0.4 mg under the tongue every 5 (five) minutes as needed. MAX 3 doses       . ondansetron (ZOFRAN) 4 MG tablet Take 4 mg by mouth every 6 (six) hours as needed.        . potassium chloride SA (K-DUR,KLOR-CON) 20 MEQ tablet Take 20 mEq by mouth 3 (three) times daily.        . rosuvastatin (CRESTOR) 40 MG tablet Take 40 mg by mouth daily.        . traMADol (ULTRAM) 50 MG tablet Take 50 mg by mouth every 4 (four) hours as needed.        Marland Kitchen DISCONTD: furosemide (LASIX) 80 MG tablet Take 120 mg by mouth 2 (two) times daily.          Allergies  Allergen  Reactions  . Amlodipine Besylate   . Iohexol      Code: RASH, Desc: JENNIFER STATES ON PT'S CHART ALLERGIC TO IV DYE 09/04/07/RM, Onset Date: [id number]     Vital Signs: BP 148/82  Pulse 77  Resp 18  Ht 5\' 5"  (1.651 m)  Wt 215 lb (97.523 kg)  BMI 35.78 kg/m2  PHYSICAL EXAM: Well nourished, well developed, in no acute distress HEENT: normal Neck: no JVD Cardiac:  normal S1, S2; RRR; no murmur Lungs:  clear to auscultation bilaterally, no wheezing, rhonchi or rales Abd: soft, nontender, no hepatomegaly Ext: 1+ ankle edema on left and trace on right Skin: warm and dry Neuro:  CNs 2-12 intact, no focal abnormalities noted  EKG:  NSR, HR 77, LAD, septal infarct age undetermined, NSSTTW changes, no sig. Change since 04/30/10  ASSESSMENT AND PLAN:

## 2010-10-12 ENCOUNTER — Ambulatory Visit: Payer: Self-pay | Admitting: Pulmonary Disease

## 2010-10-15 ENCOUNTER — Telehealth: Payer: Self-pay | Admitting: *Deleted

## 2010-10-15 ENCOUNTER — Other Ambulatory Visit (INDEPENDENT_AMBULATORY_CARE_PROVIDER_SITE_OTHER): Payer: Medicare Other | Admitting: *Deleted

## 2010-10-15 DIAGNOSIS — E876 Hypokalemia: Secondary | ICD-10-CM

## 2010-10-15 DIAGNOSIS — I509 Heart failure, unspecified: Secondary | ICD-10-CM

## 2010-10-15 DIAGNOSIS — N259 Disorder resulting from impaired renal tubular function, unspecified: Secondary | ICD-10-CM

## 2010-10-15 DIAGNOSIS — I5032 Chronic diastolic (congestive) heart failure: Secondary | ICD-10-CM

## 2010-10-15 LAB — BASIC METABOLIC PANEL
CO2: 29 mEq/L (ref 19–32)
Chloride: 96 mEq/L (ref 96–112)
Potassium: 3.2 mEq/L — ABNORMAL LOW (ref 3.5–5.1)
Sodium: 138 mEq/L (ref 135–145)

## 2010-10-15 NOTE — Telephone Encounter (Signed)
D/w with Sheldon Silvan, RN Tereso Newcomer, PA-C

## 2010-10-15 NOTE — Telephone Encounter (Signed)
Notes Recorded by Jacqlyn Krauss, RN on 10/15/2010 at 4:12 PM I talked with pt. She agreed to increase KCL to 40 mEq daily. She will return for repeat BMP 10/22/10. Preliminarily reviewed by Triage. Awaiting MD review and signature. Notes Recorded by Jacqlyn Krauss, RN on 10/15/2010 at 4:08 PM I reviewed with Tereso Newcomer. He recommended pt increase KCL to 40 mEq bid and repeat BMP in 1 week. Preliminarily reviewed by Triage. Awaiting MD review and signature.

## 2010-10-19 ENCOUNTER — Encounter: Payer: Self-pay | Admitting: Pulmonary Disease

## 2010-10-20 ENCOUNTER — Other Ambulatory Visit: Payer: Self-pay | Admitting: Endocrinology

## 2010-10-22 ENCOUNTER — Other Ambulatory Visit (INDEPENDENT_AMBULATORY_CARE_PROVIDER_SITE_OTHER): Payer: Medicare Other | Admitting: *Deleted

## 2010-10-22 DIAGNOSIS — E876 Hypokalemia: Secondary | ICD-10-CM

## 2010-10-22 LAB — BASIC METABOLIC PANEL
BUN: 27 mg/dL — ABNORMAL HIGH (ref 6–23)
CO2: 29 mEq/L (ref 19–32)
Calcium: 9.1 mg/dL (ref 8.4–10.5)
GFR: 46.64 mL/min — ABNORMAL LOW (ref >60.00)
Glucose, Bld: 281 mg/dL — ABNORMAL HIGH (ref 70–99)
Potassium: 3.8 mEq/L (ref 3.5–5.1)
Sodium: 139 mEq/L (ref 135–145)

## 2010-10-23 ENCOUNTER — Ambulatory Visit (INDEPENDENT_AMBULATORY_CARE_PROVIDER_SITE_OTHER): Payer: Medicare Other | Admitting: Pulmonary Disease

## 2010-10-23 ENCOUNTER — Encounter: Payer: Self-pay | Admitting: Pulmonary Disease

## 2010-10-23 VITALS — BP 160/78 | HR 80 | Temp 98.2°F | Ht 66.0 in | Wt 217.8 lb

## 2010-10-23 DIAGNOSIS — G4733 Obstructive sleep apnea (adult) (pediatric): Secondary | ICD-10-CM

## 2010-10-23 NOTE — Assessment & Plan Note (Signed)
The pt has known severe osa, and had been doing well with cpap until recently.  She is now having severe nasal congestion that is interfering with cpap use, and on exam has swollen turbinates with airway narrowing.  Will work on nasal hygiene regimen, and see if this helps.  I have also encouraged her to work on weight loss.

## 2010-10-23 NOTE — Patient Instructions (Signed)
Get chlorpheniramine 8mg  each night at bedtime for next 2 weeks Try nasonex nasal 2 sprays each nostril each am for next 2 weeks to see if helps nasal congestion Nasal saline spray at night before you put cpap on.  Work on weight loss followup with me in one year if doing well, but call if nasal congestion does not get better.

## 2010-10-23 NOTE — Progress Notes (Signed)
  Subjective:    Patient ID: [name], female    DOB: March 03, 1930, 77 y.o.   MRN: [id number]  HPI The pt comes in today for f/u of her known osa.  She had been wearing cpap compliantly and doing well, but most recently has had worsening nasal congestion that interferes with her cpap use.  She also was in hospital recently for what sounds like decompensated diastolic heart failure.  Prior to this issue with her nasal congestion, the pt felt she was doing well with cpap.   Review of Systems  Constitutional: Positive for unexpected weight change. Negative for fever.  HENT: Positive for congestion and rhinorrhea. Negative for ear pain, nosebleeds, sore throat, sneezing, trouble swallowing, dental problem, postnasal drip and sinus pressure.   Eyes: Positive for redness. Negative for itching.  Respiratory: Negative for cough, chest tightness, shortness of breath and wheezing.   Cardiovascular: Positive for leg swelling. Negative for palpitations.  Gastrointestinal: Positive for nausea and vomiting.  Genitourinary: Negative for dysuria.  Musculoskeletal: Negative for joint swelling.  Skin: Negative for rash.  Neurological: Negative for headaches.  Hematological: Does not bruise/bleed easily.  Psychiatric/Behavioral: Negative for dysphoric mood. The patient is not nervous/anxious.        Objective:   Physical Exam Obese female in nad No skin breakdown or pressure necrosis from cpap mask Nares with very swollen turbinates, right nare nearly obstructed.  No purulence seen LE with mild edema, no cyanosis Alert, does not appear sleepy, moves all 4        Assessment & Plan:

## 2010-10-26 ENCOUNTER — Other Ambulatory Visit: Payer: Self-pay | Admitting: Family Medicine

## 2010-10-30 NOTE — H&P (Signed)
[name], [name]          ACCOUNT NO.:  [id number]   MEDICAL RECORD NO.:  [id number]          PATIENT TYPE:  INP   LOCATION:  1827                         FACILITY:  [facility]   PHYSICIAN:  Madolyn Frieze. Jens Som, MD, FACCDATE OF BIRTH:  [birth year]10[birth year]31   DATE OF ADMISSION:  12/23/2006  DATE OF DISCHARGE:                              HISTORY & PHYSICAL   PRIMARY CARDIOLOGIST:  Maisie Fus C. Daleen Squibb, MD, [facility]   PRIMARY CARE PHYSICIAN:  Sean A. Everardo All, MD   HISTORY OF PRESENT ILLNESS:  This is a 77 year old obese African-  American female with past medical history of nonobstructive CAD,  hypertension, diabetes, diastolic heart failure with an EF of 60%, and  chronic renal insufficiency, who presented to the emergency room after a  3-day history of diarrhea and diaphoresis.  The patient states that she  tried to call her primary care physician, Dr. Everardo All, but the doctor's  office was booked and tried to call Dr. Vern Claude office, and he was booked  as well.  The patient awoke this morning, again to get up to use the  bathroom for the diarrhea, began to feel very lightheaded, began to have  some shortness of breath and pressure on her chest radiating toward her  back.  The patient stated that she began to feel very lightheaded.  She  went back to bed, put her CPAP on and continued to feel very lightheaded  with chest pressure.  She called EMS.  Nitroglycerin was given  sublingual along with aspirin, resulting in hypotension with a systolic  blood pressure of 60.  The patient, upon arrival to the emergency room,  was given 250 mL fluid bolus with improvement of blood pressure, which  is now improved at 132 systolic.  The chest pain is described as mid  sternal heaviness radiating toward her back.  She also is complaining of  mild abdominal tenderness, which is reproducible on palpation.  The  patient also has had some low-grade nausea and one episode of vomiting  since she has been here in the  emergency room .   REVIEW OF SYSTEMS:  As described above, otherwise negative.   PAST MEDICAL HISTORY:  1. Chronic chest pain, noncardiac.  2. Hypertension.  3. History of medical noncompliance.  4. Nonischemic diastolic heart failure.  5. Chronic renal insufficiency.  6. Diabetes mellitus type 2.  7. Obstructive sleep apnea with evidence of pulmonary hypertension.      Most recent pulmonary artery pressure was 60/21 with a mean of 40      and pulmonary capillary wedge pressure of 26 per catheterization in      December of 2007.  The patient does wear CPAP.  8. Depression.  9. Hyperlipidemia.  10.GERD.  11.Diabetic neuropathy.  12.Chronic vertigo and dizziness.  13.Asthma.  14.History of gallstones.   Past cardiac cath was dated December of 2007 revealing nonobstructive  CAD, 30% narrowing of LAD, 40% in mid-distal circumflex, 30% proximal  right coronary artery, elevated PAP at 60/21 mean 40, pulmonary  capillary wedge pressure at 26, LVEF at 60%.   PAST SURGICAL HISTORY:  1. Chronic craniectomy with bur  holes.  2. Esophageal dilatation secondary to stricture.   SOCIAL:  The patient lives in [place] with her husband.  She has 3  grandchildren.  She denies use of alcohol or tobacco.  She uses a walker  for ambulation.  She is on a diabetic diet.   FAMILY HISTORY:  Unknown etiology on her mom.  Father is deceased but  has had history of a pacemaker.   CURRENT MEDICATIONS AT HOME:  1. Insulin 75/25 with 140 units in the a.m., 70 units in the p.m.  2. Advair 250/50 one puff b.i.d.  3. Lipitor 80 mg q.h.s.  4. Hydralazine 50 mg t.i.d.  5. Avapro 300 mg once a day.  6. Lasix 80 mg once a day.  7. Potassium 20 mEq once a day.  8. Labetalol 100 mg daily.  9. Aspirin 325 mg daily.  10.Celexa 20 mg 1 p.o. daily (this is a new medication x1 week).  11.Metolazone 2.5 mg once a day.  12.Phenergan 25 mg every 6 hours p.r.n.   ALLERGIES:  IVP DYE.   CURRENT LABORATORY:   Sodium 141, potassium 4.0, chloride 105, CO2 of 25,  BUN 27, creatinine 1.9, glucose 208.  Hemoglobin 15.6, hematocrit 46.0.  BNP 84, PT 13.4, INR 1.0.  Point-of-care cardiac markers:  Troponin less  than 0.05, CK-MB 3.6, myoglobin 235.   Chest x-ray reveals mild cardiac enlargement with no acute disease.  EKG  reveals normal sinus rhythm with ventricular rate of 60 beats per minute  with T wave inversion noted inferolaterally, unchanged since December of  2007.   PHYSICAL EXAM:  VITAL SIGNS:  Blood pressure 132/68, heart rate 60,  respirations 18, temperature 97.1, O2 sat 99% on 3 liters.  HEENT:  Head is normocephalic, atraumatic.  Eyes:  PERRLA.  Dentition is  poor.  NECK:  Obese, supple.  There is no carotid bruit.  There is no JVD seen.  CARDIOVASCULAR:  Distant heart sounds, regular rate and rhythm without  murmur, rub, or gallop.  Radial pulses and dorsalis pedis pulses are 1+  bilaterally.  No abdominal bruits are appreciated.  LUNGS:  Clear to auscultation.  ABDOMEN:  Obese with tenderness to palpation.  Hyperactive bowel sounds.  More tenderness in the right upper quadrant.  EXTREMITIES:  Without clubbing, cyanosis, edema.  NEURO:  Cranial nerves II-XII are grossly intact, although she does  admit to some mild tingling in her feet and toes on occasion.   IMPRESSION:  This is a 77 year old morbidly obese African-American  female patient of Dr. Juanito Doom with a history of pulmonary hypertension,  diabetes, hypertension, cholesterol, diastolic heart failure, renal  insufficiency, and obstructive sleep apnea with a history of chronic  chest pain and depression, who was seen and examined by myself and Dr.  Olga Millers in the emergency room with main complaint of chest pain,  shortness of breath, and diarrhea x3 days.  Cardiac catheterization in  December of 2007 is reassuring, revealing an EF of 60%, elevated  pulmonary capillary wedge pressure, and nonobstructive coronary  artery  disease.  The patient's chest pain is increased with certain movements  and inspiration similar to longstanding chest discomfort, also with  dyspnea on exertion which is chronic.  She did have some subjective  fever and diarrhea but denies abdominal pain, although this is present  on the examination and palpation.  Her BNP was 84.  Initial point-of-  care cardiac markers were negative.  Chest x-ray was normal.  EKG with  normal  sinus rhythm with anterolateral T wave inversion, which is  unchanged from December of 2007.  She was treated en route with  nitroglycerin causing hypotension.   PLAN:  Our plan will be to admit the patient, rule out myocardial  infarction.  If enzymes are negative, no further ischemic evaluation is  warranted.  Secondary to hypotension, following nitroglycerin, we will  resume blood pressure medications with the exception of metolazone and  hydralazine at present and slowly reinstitute these medications during  hospitalization.  The patient's diarrhea is of uncertain etiology.  We  will check stools for white blood cells, ova and parasites, Hemoccult,  and Clostridium difficile, and evaluate further.  We will also check  liver function tests, amylase and lipase secondary to her abdominal  tenderness on exam.  Rule out pancreatitis.  We will discharge in the  a.m. if enzymes are negative.  If any further studies come back  positive, we will evaluate further and may institute help of internal  medicine.      Bettey Mare. Lyman Bishop, NP      Madolyn Frieze. Jens Som, MD, [facility]  Electronically Signed    KML/MEDQ  D:  12/23/2006  T:  12/23/2006  Job:  [id number]   cc:   Gregary Signs A. Everardo All, MD

## 2010-10-30 NOTE — Op Note (Signed)
NAMEGLADYES, KUDO          ACCOUNT NO.:  [id number]   MEDICAL RECORD NO.:  [id number]          PATIENT TYPE:  OBV   LOCATION:  1307                         FACILITY:  [facility]   PHYSICIAN:  Sandria Bales. Ezzard Standing, M.D.  DATE OF BIRTH:  Jul 29, 1929   DATE OF PROCEDURE:  09/19/2008  DATE OF DISCHARGE:                               OPERATIVE REPORT   Date of Surgery - 19 September 2008   PREOPERATIVE DIAGNOSIS:  Gallstones, recurrent biliary colic.   POSTOPERATIVE DIAGNOSIS:  Recurrent biliary colic, Gallstones with  impacted gallstone in cystic duct.   PROCEDURE:  Laparoscopic cholecystectomy with intraoperative  cholangiogram.   SURGEON:  Sandria Bales. Ezzard Standing, MD.   FIRST ASSISTANT:  Lorne Skeens. Hoxworth, MD.   ANESTHESIA:  General endotracheal.   ESTIMATED BLOOD LOSS:  Minimal.   PROCEDURE:  Ms. Kierstead is a 77 year old black female, a patient of Dr.  Romero Belling who was admitted in January 2010 for biliary colic.  I had  set her up for elective cholecystectomy on September 01, 2008, but she came  to the hospital hypotensive, hyperglycemic and that surgery was delayed.  She then represented last night or this morning to the [facility]  emergency room with recurrent biliary symptoms.  Because of the somewhat  odd nature of her symptoms, repeated CAT scan today which showed no  other cause or etiology for recurrent abdominal pain.  I then discussed  with her and her husband proceeding with gallbladder surgery.  The  potential risks of surgery included bleeding, infection, common bile  duct injury, open surgery.   OPERATIVE NOTE:  The patient placed in the supine position, given a  general endotracheal anesthetic.  Her abdomen was prepped with HCG and  sterilely draped.  A time-out was held identifying the patient and the  procedure.   I accessed the abdominal cavity through an infraumbilical incision with  sharp dissection carried down to the abdominal cavity.  A 0 degree 10-mm  laparoscopic was inserted through a 12-mm Hasson trocar.  Three  additional trocars were placed, a 12-mm subxiphoid, a 5-mm mid subcostal  and a 5-mm lateral subcostal trocar.   The patient had a fair amount of gaseous distention of her colon.  She  had some air in her stomach which was decompressed with an orogastric  tube.  Her liver was otherwise unremarkable.   I then turned attention to the gallbladder.  The gallbladder was  somewhat large but not acutely inflamed, it was grasped, rotated  cephalad.  I then dissected out along the cystic duct gallbladder  junction, I identified enough of the cystic duct, then I placed a clip  on the gallbladder side and shot an intraoperative cholangiogram.   The intraoperative cholangiogram was shot using a cutoff taut catheter  inserted through a 14 gauge Jelco catheter and the taut catheter was  inserted inside the cystic duct and an intraoperative cholangiogram  obtained.  This showed free flow of contrast down the common bile duct  which was mildly enlarged into the duodenum, it did not reflux back into  the hepatic radicals.  I placed the patient in  mild Trendelenburg and  the it did fill the hepatic radicals, there was no bile leak and this  was felt to be a normal intraoperative cholangiogram.   Then triply clipped the cystic duct and I identified the cystic artery  that I had doubly endoclipped along with the node of Calot.  I then  sharply and bluntly dissected the gallbladder from the gallbladder bed.  Prior to complete division of the gallbladder from the gallbladder bed,  I revisualized the triangle of Calot, I revisualized the gallbladder  bed, there was no bile leak, no bleeding.  The gallbladder was then  placed in an EndoCatch bag and delivered through the umbilicus.  The  patient was noted to have at least two bilirubin-appearing stones  impacted in the cystic duct which I milked back before for my  cholangiogram.   I think  this was the source of her pain.   Her abdomen was irrigated with about 500 mL of saline, the umbilical  port closed with a 0 Vicryl suture, the skin at each port closed with 5-  0 Vicryl suture painted with tincture of benzoin and Steri-stripped.  The patient tolerated the procedure well, was transported to the  recovery room in good condition.  Sponge and needle count were correct  at the end of the case.      Sandria Bales. Ezzard Standing, M.D.  Electronically Signed     DHN/MEDQ  D:  09/19/2008  T:  09/19/2008  Job:  [id number]   cc:   Gregary Signs A. Everardo All, MD  [address]  [place]  [place] [address]   Jesse Sans. Wall, MD, FACC  [address]  [address]  [place]  [place] [address]   Venita Lick. Russella Dar, MD, FACG  [address]  [place]  [place] [address]   Rosalyn Gess. Norins, MD  [address]  [place]  [place] [address]

## 2010-10-30 NOTE — Assessment & Plan Note (Signed)
[facility]                            CARDIOLOGY OFFICE NOTE   CHERYL, CHAY                 MRN:          [id number]  DATE:03/21/2008                            DOB:          08-May-1930    Ms. Hoecker returns today for followup.  She is very stressed today  about losing a first cousin who lived in [place], [place] that she  was very close to.  She had been doing a lot traveling.   She is also concerned about being in the doughnut hole and running out  of insulin.  Dr. Everardo All has been kind and gracious to get her samples.  She has been taking all of her medications as best I can tell.   Her weight is rock solid at 237.   MEDICATIONS:  1. Insulin as directed.  2. Avapro 300 mg a day.  3. Advair 2 puffs b.i.d.  4. Aspirin 325 a day.  5. Hydralazine 50 mg t.i.d.  6. Lasix 80 mg a day.  7. Labetalol 400 b.i.d.  8. Isosorbide 30 mg p.o. b.i.d.  9. Crestor 20 mg nightly.  10.Potassium 20 mEq b.i.d.  11.Metolazone 5 mg a day.   PHYSICAL EXAMINATION:  VITAL SIGNS:  Her blood pressure is 150/88, which  is better than usual.  Her heart rate is 84 and regular.  Her weight is  237.  HEENT:  Unchanged.  NECK:  Carotid upstrokes were equal bilaterally without bruits; JVD  could not be assessed.  Thyroid is not enlarged.  Trachea is midline.  LUNGS:  Clear to auscultation and percussion.  HEART:  Soft S1 and S2.  PMI could not be appreciated.  ABDOMEN:  Obese, good bowel sounds.  EXTREMITIES:  Very little edema, may be trace on the right, none on the  left.  Pulses are intact.  NEURO:  Intact.   Ms. Wyn Quaker is doing fairly well from our standpoint.  I have encouraged  her to take her medications.  I offered her some Crestor with Dr.  Everardo All has given her plenty.  We will plan on  seeing her back in 6 months.  I have made no change in her program.  Dr.  Everardo All is following her blood work.     Thomas C. Daleen Squibb, MD, [facility]  Electronically Signed    TCW/MedQ  DD: 03/21/2008  DT: 03/21/2008  Job #: [id number]

## 2010-10-30 NOTE — Consult Note (Signed)
[name], [name]          ACCOUNT NO.:  [id number]   MEDICAL RECORD NO.:  [id number]          PATIENT TYPE:  INP   LOCATION:  1427                         FACILITY:  [facility]   PHYSICIAN:  Verne Carrow, MDDATE OF BIRTH:  06-28-1929   DATE OF CONSULTATION:  07/01/2008  DATE OF DISCHARGE:                                 CONSULTATION   PRIMARY CARDIOLOGIST:  Dr. Juanito Doom.   PRIMARY CARE PHYSICIAN:  Dr. Romero Belling.   HISTORY OF PRESENT ILLNESS:  [name] is a delightful 77 year old  African American female with a history of obesity, diastolic  dysfunction, hypertension, hyperlipidemia, diabetes mellitus, COPD,  asthma, obstructive sleep apnea and nonobstructive coronary artery  disease who was admitted to [facility] on June 29, 2008  with complaints of right-sided epigastric pain.  Her workup so far has  been consistent with gallstones but no findings of acute cholecystitis.  There are plans in place today for a HIDA scan.  The patient has a known  cardiac history and because of this a cardiology consultation is  requested for preoperative risk assessment.   The patient tells me that over the last 6 months she has had almost  daily exertional chest pain.  This pain is left-sided and is described  as a heaviness in her chest that occurs when she walks.  The pain does  not radiate, but is associated with diaphoresis, increased dyspnea and  dizziness.  The pain resolves when she rests.  There have been no  exacerbating or relieving factors.  She denies any syncopal episodes,  awareness of palpitations, orthopnea, PND or worsening in her lower  extremity edema.  She tells me that her lower extremity edema is  actually much improved over her baseline currently.  She has had a prior  heart catheterization performed in December 2007 which showed mild  nonobstructive coronary artery disease with a 30% stenosis in the mid  LAD, 40% stenosis in the mid  circumflex and a 30% stenosis of the  proximal circumflex.  Her ejection fraction at that time was estimated  at 60% by left ventricular angiogram.   Currently she tells me that she is having no chest pain while lying in  bed but continues to have episodic right upper quadrant pain, especially  after eating.   PAST MEDICAL HISTORY:  1. Nonobstructive coronary artery disease with findings per      catheterization in December 2007 as outlined above.  2. Diastolic congestive heart failure.  3. Hypertension.  4. Hyperlipidemia.  5. Diabetes mellitus.  6. GERD.  7. Osteoarthritis.  8. Asthma.  9. COPD.  10.Obstructive sleep apnea.  11.Diabetic retinopathy and peripheral neuropathy.  12.Cholelithiasis.  13.Peptic ulcer disease.  14.Depression.   PAST SURGICAL HISTORY:  1. Bilateral tubal ligation in 1967.  2. Left craniotomy for subdural hematoma in 1997.  3. Left knee scope.  4. Bilateral cataract surgery in 2005.  5. Cervical spine and lumbar surgery.   ALLERGIES:  NORVASC, IV CONTRAST DYE.   CURRENT MEDICATIONS:  1. Unisom 3 grams every 6 hours.  2. Aspirin 325 mg once daily.  3. Advair one  puff twice daily.  4. Lasix 80 mg once daily.  5. Heparin 5000 units t.i.d.  6. Hydralazine 50 mg p.o. three times daily.  7. Imdur 30 mg twice daily.  8. Labetalol 100 mg twice daily.  9. Zaroxolyn 5 mg once daily.  10.Benicar 40 mg once daily.  11.Crestor 40 mg once daily.   SOCIAL HISTORY:  She admits to occasional alcohol use but denies use of  tobacco or any illicit drugs.  She is a lifelong nonsmoker.  She is  married and currently retired.  She has seven children.   FAMILY HISTORY:  There is no family history of coronary artery disease  or sudden cardiac death.   REVIEW OF SYSTEMS:  As stated in the history present illness and is  otherwise negative.   PHYSICAL EXAMINATION:  VITALS:  Blood pressure 152/44, pulse 63 and  regular, respirations 18 and unlabored, satting  99% on room air,  temperature 97.6.  Telemetry is reviewed and reveals normal sinus  rhythm.  GENERAL:  Alert, oriented x3, in no acute distress.  NECK:  No obvious JVD.  No carotid bruits or thyromegaly noted.  SKIN:  Warm, dry, no rashes noted.  PSYCHIATRIC:  Mood and affect are appropriate.  MUSCULOSKELETAL:  Muscle strength and tone is normal.  NEUROLOGICAL:  No focal neurological deficits.  HEENT:  Normal.  LUNGS:  Clear to auscultation bilaterally without any evidence of  rhonchi, wheezes or crackles.  CARDIOVASCULAR:  Regular rate and rhythm without murmurs, gallops or  rubs noted.  The heart sounds are distant.  ABDOMEN:  Obese, soft.  Right upper quadrant tenderness to palpation.  Bowel sounds are present.  EXTREMITIES:  Trace bilateral lower extremity edema.  Pulses are 2+ in  all extremities.   DIAGNOSTIC STUDIES:  1. Current laboratory values show potassium of 3.6, creatinine 1.33.      Hemoglobin 12.5, platelets of 211, CK 171, CK-MB 5.7.  Troponin on      June 29, 2008 of 0.06.  Of note, the patient's CK-MB on June 29, 2008 was 7.6.  2. Chest x-ray from June 29, 2008 with mild cardiomegaly and      pulmonary venous congestion.    1. Diastolic dysfunction.  The patient currently seems to be      euvolemic.  I would continue her Lasix 80 mg once daily and the      Zaroxolyn at 5 mg once daily.  I would also check a BNP today.  2. Nonobstructive coronary artery disease.  The patient was found by      heart catheterization in December 2007 to have mild to moderate      nonobstructive coronary artery disease.  She is now complaining of      daily exertional chest pain that is worrisome for angina.  I have      reviewed the medical record and cannot find  a recent ischemic      assessment.  Her cardiac enzymes are borderline during the first 2      days of her hospitalization.  I will get an EKG now.  I would also      like to get another set of cardiac  enzymes now to see the trend.      The patient has not had any chest pains since she has been here in      the hospital.  We should continue her aspirin and labetalol at the  current dosages.  I will order an echocardiogram for today to      assess her left ventricular function and to rule out any regional      wall motion abnormalities.  We will continue to follow this patient      with you while she is here in the hospital.  She may need an      ischemic assessment prior to any surgical procedures.  We will      review the results of the testing from today and will make further      recommendations this weekend.  The ischemic workup will likely      include either an adenosine Myoview or left heart catheterization,      pending the results from the testing.  3. Right upper quadrant pain, questionable cholecystitis.  Management      per the primary team.  HIDA scan is planned for today.      Verne Carrow, MD  Electronically Signed     CM/MEDQ  D:  07/01/2008  T:  07/01/2008  Job:  [id number]   cc:   Thomas C. Wall, MD, FACC  [address]  [address]  [place]  [place] [address]   Gregary Signs A. Everardo All, MD  [address]  [place]  [place] [address]

## 2010-10-30 NOTE — Cardiovascular Report (Signed)
NAMESHAYLA, HEMING          ACCOUNT NO.:  [id number]   MEDICAL RECORD NO.:  [id number]          PATIENT TYPE:  AMB   LOCATION:  CATH                         FACILITY:  [facility]   PHYSICIAN:  Bevelyn Buckles. Bensimhon, MDDATE OF BIRTH:  11-10-1929   DATE OF PROCEDURE:  07/04/2008  DATE OF DISCHARGE:                            CARDIAC CATHETERIZATION   IDENTIFICATION:  [name] is delightful 77 year old woman with a  history of obesity, hypertension, hyperlipidemia, diabetes, sleep apnea,  and known nonobstructive coronary artery disease based on  catheterization in December 2007.  She was admitted with what appears to  be biliary colic.  During her workup, she was also complaining of daily  exertional chest pain which resolves with rest.  She was evaluated by  Dr. Clifton James and plans were made for a cardiac catheterization due to  concern over her angina.   PROCEDURES PERFORMED:  1. Selective coronary angiography.  2. Left heart catheterization.  3. Star[employer] common femoral artery closure.   DESCRIPTION OF PROCEDURE:  The risks and indication of the  catheterization were explained.  Consent was signed and placed on the  chart.  Based on the patient's history of contrast allergy, she was  premedicated in the standard fashion with prednisone, Pepcid, and  Benadryl.  We did not perform left ventriculography due to her renal  insufficiency.  A 5-French arterial sheath was placed in the right  femoral artery and standard catheters including JL-4 and JR-4 were used  for the catheterization.  All catheter exchanges made over wire.  There  were no apparent complications.  During the procedure, the patient did  have significant hypertension with systolic blood pressure is almost  220.  She was treated with 10 mg of IV hydralazine x2.  This had good  result with systolic blood pressure in the 150s.   Central aortic pressure was 220/77 with mean of 125.  LV pressure is  150/90 with an EDP  of 15.  This was after 20 mg of IV hydralazine.  There was no aortic stenosis.   Total contrast used was 50 mL.   Left main was normal.   LAD was a long vessel wrapping the apex gave off two diagonal branches.  There was a 20% proximal lesion and 40% lesion in the midsection.   Left circumflex was a large vessel gave off a large OM1 and a small OM2.  There was a 20% lesion proximal in the AV groove circ and a 40% lesion  in the mid AV groove circ.   Right coronary artery was a large dominant vessel gave off a PDA and  posterolateral.  There was a tubular 60-70% lesion in the proximal RCA,  which was somewhat worse than our previous catheterization 2007, but did  not appear flow limiting.   ASSESSMENT:  1. Nonobstructive coronary artery disease.  2. Severe hypertension.   PLAN/DISCUSSION:  I have reviewed the catheterization films with Dr.  Clifton James.  We both feel that the RCA lesion is not flow limiting.  I  suspect her chest pain is due to her severe hypertension and probable  elevated LVEDP.  We will  continue risk factor management.  Currently,  she can proceed to surgery without further cardiac workup.      Bevelyn Buckles. Bensimhon, MD  Electronically Signed     DRB/MEDQ  D:  07/04/2008  T:  07/04/2008  Job:  [id number]

## 2010-10-30 NOTE — Assessment & Plan Note (Signed)
[facility]                            CARDIOLOGY OFFICE NOTE   DARION, JUHASZ                 MRN:          [id number]  DATE:01/02/2007                            DOB:          07-01-1929    Ms. Lanter returns today after being discharged from the hospital for  chest pain.  Her cardiac enzymes were negative.  And we decided to treat  her medically.  Her biggest complaint is getting lightheaded and sleepy  after she takes all her medicines in the morning.  When asked, she takes  most everything in the morning and also wakes up at 2;15 in the morning  to take her Avapro!  She says she has a lot of bladder frequency and  urgency which she has had for years.  She denies any orthopnea, PND, or  increased peripheral edema.  Her blood pressures have been good.  She is  having a hard time affording the Avapro and the Crestor.  Crestor is  costing her about $88 a month and her Avapro is costing her $90 a month.   Her meds are numerous and are listed on the maintenance medication list.  The cardiovascular medicines are:  1. Metolazone 2.5 mg a day.  2. Potassium 20 mEq a day.  3. Avapro 300 mg p.o. 2:15 a.m.  4. Isosorbide 30 mg a day.  5. Aspirin 325 a day.  6. Hydralazine 50 mg p.o. t.i.d.  7. Labetalol 100 mg q.a.m.  8. Lasix 80 mg a day.  9. Crestor 20 mg a day.   The biggest problem we have had is controlling her blood pressure which  has created problems with acute on chronic diastolic heart failure.   PHYSICAL EXAMINATION:  VITAL SIGNS:  Blood pressure today is 123/60.  We  checked orthostatics and she was not orthostatic.  She was mildly dizzy  however.  HEENT:  Unchanged.  NECK:  Shows no JVD.  Carotids are full without bruits.  LUNGS:  Clear.  HEART:  Reveals a regular rate and rhythm without gallop.  ABDOMEN:  Soft.  Good bowel sounds.  Organomegaly cannot be assessed.  EXTREMITIES:  Reveal very little edema.  Pulses are  intact.   I spent about 20 minutes trying to work out a better regimen for Ms.  Dartt to avoid dizziness.  I have also talked to her about generic  simvastatin which may save her as much as $80 a month.   PLAN:  1. Change Crestor to Simvastatin 40 mg q.h.s.  I think Dr. Everardo All      will be fine with this, anything to get her to be compliant, save      her money.  2. Take Avapro at bedtime along with her labetalol.  Hopefully she can      avoid some dizziness in the morning.   I will plan on seeing her back in in 3 months.     Thomas C. Daleen Squibb, MD, [facility]  Electronically Signed    TCW/MedQ  DD: 01/02/2007  DT: 01/02/2007  Job #: [id number]   cc:   Gregary Signs A. Everardo All, MD

## 2010-10-30 NOTE — Discharge Summary (Signed)
[name], [name]          ACCOUNT NO.:  [id number]   MEDICAL RECORD NO.:  [id number]          PATIENT TYPE:  INP   LOCATION:  1427                         FACILITY:  [facility]   PHYSICIAN:  Rosalyn Gess. Norins, MD  DATE OF BIRTH:  [birth year]   DATE OF ADMISSION:  06/29/2008  DATE OF DISCHARGE:  07/07/2008                               DISCHARGE SUMMARY   ADMITTING DIAGNOSES:  1. Abdominal pain, right upper quadrant.  2. Unspecified fever.  3. Diabetes.  4. Hypertension.  5. Obesity.   DISCHARGE DIAGNOSIS:  1. Cholelithiasis for delayed outpatient scheduling of surgery.  2. Fever, possible cholecystitis, resolved.  3. Diabetes stable.  4. Hypertension stable.  5. Coronary artery disease stable with no obstructive disease by      cardiac catheterization this admission.   CONSULTANTS:  Dr. Ovidio Kin for general surgery.  Dr. Juanito Doom for  cardiology along with Verne Carrow.   HISTORY OF PRESENT ILLNESS:  The patient is a 77 year old woman followed  by Dr. Romero Belling.  She presented with vague abdominal pain discomfort  to 3 days along with weakness, fever.  Pain was worse the right upper  quadrant with some radiation to the right lower quadrant.  The patient  had to take a laxative for constipation, but this did not make the pain  any better.  She had had some chills on the morning of admission but no  nausea or vomiting or diarrhea.  Because of her discomfort she was  admitted to hospital for further evaluation and possible treatment for  cholelithiasis, cholecystitis.   CONSULTANTS:  As noted.   PROCEDURES:  1. Chest x-ray two views day of admission which was unremarkable      except for mildly enlarged heart and pulmonary venous hypertension.  2. CT of the abdomen and pelvis which showed moderate size hiatal      hernia.  No effusions.  Heart was with borderline in size.  11      gallstones in the gallbladder but no CT evidence of acute  cholecystitis.  Pelvis with no acute findings.  3. Hepatobiliary scan January 15th which showed patent cystic duct      without evidence of acute cholecystitis.  Gallbladder ejection      fraction low at 6.1% with normal being 30%.  4. Cardiac catheterization performed July 04, 2008 with a 60-70%      RCA, 20% LAD, with no obstructive lesions noted.  This was not felt      to require any intervention and she was cleared for surgery.   HOSPITAL COURSE:  1. GI: Patient with evidence of cholelithiasis with no evidence of      cholecystitis.  The patient was treated with Zosyn for infection to      cover her for any GI source of infection.  She did do well on this      regimen.  Her pain in the right upper quadrant and generalized      abdominal pain did resolve.  She was seen in consultation by Dr.      Ovidio Kin who felt that she would  be best served by delayed      surgery 3-4 weeks following recovery from this hospitalization and      he would be glad to see her as an outpatient.  With the patient's      pain being resolved with her having no evidence of infection at      this point she is stable for discharge home.  2. Diabetes.  The patient was followed with sliding scale.  She was on      heart healthy diet.  She was doing well, was felt to be stable from      that perspective and able to be discharged home.  3. Obstructive sleep apnea.  The patient is to resume the use of her      home CPAP machine.  4. Hypertension.  The patient's blood pressure has been difficult to      control as an outpatient which she did reasonably well in the      hospital.  She will be able to resume her home medications only      after her renal function returns to normal.  5. Renal insufficiency.  The patient did have cardiac cath as noted.      Following this procedure she did have a rise her creatinine from a      baseline of 1.14-1.74 to 2.12-2.44 and then started to resolve to      2.29 with a  final reading of 2.19.  The patient was generously      hydrated.  At this point she is taking good p.o. fluids and can be      continued on oral fluids at home with follow-up lab work.   With the patient's acute medical problems being resolved she is thought  to be stable and ready for discharge home.   PHYSICAL EXAMINATION:  VITALS SIGNS:  Temperature 98.1, blood pressure  140/42, heart rate 54, respirations 80, O2 sats 97% on room air.  CBG  149.  GENERAL APPEARANCE:  This is an overweight African American woman who is  sitting on the commode and in no distress.  HEENT: Exam was unremarkable.  CHEST:  Patient is moving air well with no rales, wheezes or rhonchi.  CARDIOVASCULAR:  The patient's precordium was quiet.  She had a regular  rate and rhythm.  ABDOMEN:  Difficult to examine in the sitting position which she had no  tenderness to palpation.   FINAL LABORATORY:  BMET on the day of discharge was a sodium of 1.43,  potassium 3.2, chloride 108, CO2 24, BUN 36, creatinine 2.19.  The  patient had blood cultures on the 13th which were no growth at time of  discharge.  Final CBC on January 19th with a hemoglobin of 12.6 grams,  white count was 8300.  Additional laboratory during this stay included  BNP which was normal at 90.7, lipase that was normal at 23, amylase that  was normal at 134.  Helicobacter pylori antibody was less than 0.4 and  considered negative.   DISPOSITION:  The patient will be discharged home.  She will follow up  with Dr. Romero Belling her primary care physician.  She will return to  the laboratory on Monday January 25th for follow-up metabolic panel to  monitor renal function.  The patient will call Dr. Lavonda Jumbo office  for an appointment for follow-up in 2-3 weeks.   The patient's condition at time of discharge and dictation is stable and  improved.  Rosalyn Gess Norins, MD  Electronically Signed     MEN/MEDQ  D:  07/07/2008  T:   07/07/2008  Job:  [id number]   cc:   Sandria Bales. Ezzard Standing, M.D.  [address]  [place]  [place] [address]   Jesse Sans. Wall, MD, FACC  [address]  [address]  [place]  [place] [address]   Gregary Signs A. Everardo All, MD  [address]  [place]  [place] [address]

## 2010-10-30 NOTE — Discharge Summary (Signed)
[name], [name]          ACCOUNT NO.:  [id number]   MEDICAL RECORD NO.:  [id number]          PATIENT TYPE:  INP   LOCATION:  1307                         FACILITY:  [facility]   PHYSICIAN:  Rosalyn Gess. Norins, MD  DATE OF BIRTH:  11-06-1929   DATE OF ADMISSION:  09/19/2008  DATE OF DISCHARGE:  09/21/2008                               DISCHARGE SUMMARY   ADMITTING DIAGNOSES:  1. Abdominal pain, right upper quadrant.  2. Renal insufficiency.  3. Asthma.  4. Hypertension.  5. Diabetes mellitus, poorly controlled.  6. Congestive heart failure, stable.   DISCHARGE DIAGNOSES:  1. Cholelithiasis, status post cholecystectomy.  2. Abdominal pain, right upper quadrant.  3. Renal insufficiency.  4. Asthma.  5. Hypertension.  6. Diabetes mellitus, poorly controlled.  7. Congestive heart failure, stable.   CONSULTANTS:  Dr. Ovidio Kin from general surgery.   PROCEDURES:  1. Acute abdominal film on the day of admission, September 19, 2008, which      showed no bowel obstruction or free intraperitoneal air, no      infiltrate or congestive heart failure.  2. Ultrasound of the abdomen performed September 19, 2008 which revealed      numerous tiny gallstones.  Positive Murphy sign during the      examination.  Common bile duct the upper limits of normal size.  3. CT of the abdomen and pelvis on the day of admission showing no      acute abnormality on CT of the abdomen.  Multiple small gallstones      layered within the gallbladder.  No gallbladder wall thickening or      ductal dilatation was noted.  Mild-to-moderate size hiatal hernia      noted.  Pelvis with rectosigmoid colon diverticula with no      diverticulitis.  Degenerative disk disease at L5-S1.   SURGICAL PROCEDURE:  Laparoscopic cholecystectomy performed September 19, 2008 by Dr. Ovidio Kin.  Please see operative report.   HISTORY OF PRESENT ILLNESS:  The patient is a 77 year old woman with a  history of known cholelithiasis.   She had previous hospitalization,  where she had cholecystitis.  The patient was discharged home and  completed a course of antibiotic therapy.  She was scheduled for  elective surgery for a cholecystectomy, however, at the time of hospital  presentation she was both hypotensive and hyperglycemic, and  surgery  was cancelled.   The patient reports the onset at 1 a.m. of significant abdominal pain on  the day of admission, which she described as severe.  The location was  diffuse but said this was similar in regard to previous episodes of  biliary colic.  She denied any nausea or vomiting.  She reports having  had a bowel movement as recently as the day prior to admission.  She  denied any fevers, rigors, shortness of breath or chest pain.  Because  of her discomfort she was admitted for observation and then full  admission with possible recurrence of biliary colic from cholelithiasis.   Please see the EMR H and P for past medical history, family history,  social history  and admission physical exam.   HOSPITAL COURSE:  1. Abdominal pain.  Patient with imaging studies as noted.  It was      felt that the source of her pain was her known cholelithiasis.  She      was seen by Dr. Ezzard Standing in consultation.  The patient was felt to be      a candidate for laparoscopic cholecystectomy and was taken for      surgery on the day of admission at 1600 hours.  Her surgery was      uneventful.  The patient had a good recovery.  On the first      postoperative day on September 20, 2008 she had mild hypokalemia and      some residual problems with return of diet and was kept overnight      for observation and replacement of potassium.  On the morning of      discharge the patient was sitting up eating breakfast.  She reports      her abdominal pain was markedly improved.  Potassium was replaced      and laboratory revealed a potassium level of 3.8 on the day of      discharge.  The patient's abdominal pain  being improved following      uneventful laparoscopic cholecystectomy, with her taking a good      diet and with her electrolytes being stable, she is felt ready for      discharge home.  2. Renal insufficiency, stable.  3. Asthma, stable.  4. The patient's blood pressure is stable.  5. Diabetes.  The patient is poorly compliant with her diabetic      regimen.  She has a history of markedly elevated blood sugars in      the past.  Hemoglobin A1c on the day of admission was 9.7%.  She      continued to have elevated CBGs during her hospital stay but was      not symptomatic.  The plan is for the patient to follow up Dr. Romero Belling, her primary care endocrinologist, for management of her      diabetes.   DISCHARGE EXAMINATION:  Temperature was 98.2, blood pressure was 112/55,  heart rate 60, respirations 20, O2 sats 96% on room air.  CBG was 318.  GENERAL APPEARANCE:  This is an obese African American woman sitting on  the side of the bed in no acute distress.  HEENT:  Exam was unremarkable.  CHEST:  Patient is moving air well.  No rales, wheezes or rhonchi  appreciated.  CARDIOVASCULAR:  Precordium was quiet and her heart rate was regular  with no appreciable murmurs.  ABDOMEN:  The patient in the sitting position and no tenderness to  palpation.  No further examination conducted.   FINAL LABORATORY:  Basic metabolic panel on the day of discharge with a  sodium of 138, potassium 3.8, chloride 101, CO2 30, BUN 18, creatinine  1.6, glucose was 241.  Final CBC from September 20, 2008 with a hemoglobin of  13.4 grams, white count was 8600 with a normal differential.  Platelet  count 198,000, hemoglobin A1c on the day of admission was 9.7%.   DISCHARGE MEDICATIONS:  The patient will resume all of her home  medications which include metolazone 5 mg p.o. daily, Humalog 75/25 on a  sliding scale, potassium 20 mEq b.i.d., Avapro 300 mg daily, isosorbide  dinitrate 30 mg b.i.d., Advair  Diskus 100/50 one inhalation b.i.d.,  aspirin 325 mg daily, hydralazine 50 mg t.i.d., labetalol 200 mg 2  tablets b.i.d., Lasix 80 mg daily, tramadol 50 mg one-half tablet q. 4  h. p.r.n. pain, promethazine 25 mg p.o. p.r.n. nausea, Crestor 40 mg  daily, Vicodin 5/500 one to two tablets every 6 hours as needed.   FOLLOWUP:  The patient will be scheduled to see Dr. Romero Belling for  followup in 7-10 days.  Her standing appointment for September 22, 2008 will  be cancelled.   The patient's condition at the time of discharge dictation is stable and  improved.      Rosalyn Gess Norins, MD  Electronically Signed     MEN/MEDQ  D:  09/21/2008  T:  09/21/2008  Job:  [id number]   cc:   Sandria Bales. Ezzard Standing, M.D.  [address]  [place]  [place] [address]   Gregary Signs A. Everardo All, MD  [address]  [place]  [place] [address]

## 2010-10-30 NOTE — Consult Note (Signed)
[name], [name]          ACCOUNT NO.:  [id number]   MEDICAL RECORD NO.:  [id number]          PATIENT TYPE:  INP   LOCATION:  1427                         FACILITY:  [facility]   PHYSICIAN:  Sandria Bales. Ezzard Standing, M.D.  DATE OF BIRTH:  22-Sep-1929   DATE OF CONSULTATION:  DATE OF DISCHARGE:                                 CONSULTATION   Date of consult ?   REFERRING PHYSICIAN:  Vikki Ports A. Felicity Coyer, MD   REASON FOR CONSULTATION:  Gallstones.   HISTORY OF PRESENT ILLNESS:  [name] is a pleasant, obese 77-year-  old black female who presented to [facility] on  Wednesday, January 13, with abdominal pain.  She was admitted by Dr.  Felicity Coyer.  CT scan was found to have gallstones.  She is better today,  but I was consulted for evaluation of her gallbladder disease.   She denies any history of peptic ulcer disease, liver disease,  pancreatic disease, or colon disease.  She had a colonoscopy, she  thinks, about 3 years ago by Dr. Claudette Head.  He recommended another  colonoscopy in 5 years.  Her only prior abdominal surgery was a tubal  ligation many years ago.  She denies any abdominal or gynecologic  surgery.   MEDICATIONS:  1. Insulin.  2. Avapro 300 mg daily.  3. Advair 2 puffs b.i.d.  4. Aspirin.  5. Hydralazine 50 mg t.i.d.  6. Lasix 80 mg daily.  7. Labetalol 400 mg b.i.d.  8. Isosorbide 30 mg b.i.d.  9. Crestor nightly.  10.Potassium.  11.__________ 5 mg daily.   REVIEW OF SYSTEMS:  NEUROLOGIC:  She apparently had some type of blood  clot, she says, in her brain that was evacuated by Trey Sailors in about  [phone].  I am not sure if this was subdural.  She said this was  bilateral.  PULMONARY:  She has obstructive sleep apnea, but I do not think she is  on CPAP.  She did not smoke cigarettes.  CARDIAC:  She has chronic diastolic nonischemic heart failure and  hypertension with a history of pulmonary hypertension.  She is being  followed by Dr. Juanito Doom from a cardiac standpoint.  His last office  note was March 21, 2008.  At that time it sounds like she was doing pretty well, and she had been  put on Crestor for management of her cholesterol.  GASTROINTESTINAL:  See my history of present illness.  In looking at her  discharge summary from December 24, 2006, two things:  First, they did note  that she had gallstones.  The patient was apparently unaware of the  gallstones.  That has been know, at least by her doctors for a year and  a half.  She has had a prior stricture of her distal esophagus, for  which she required an esophagogastroduodenoscopy.  UROLOGIC:  No evidence of kidney stones or kidney infections.  EXTREMITIES:  She has some edema or swelling of her lower extremity, she  said, about 3 or 4 months ago.  They did Dopplers and confirmed this was  all edema.  No evidence  of DVT.   SOCIAL HISTORY:  She is married, but her husband has gone down to get  something to eat.   PHYSICAL EXAMINATION:  VITAL SIGNS:  Her temperature is 97.7, pulse 66,  blood pressure 162/74.  HEENT:  Unremarkable.  NECK:  Supple with no masses or thyromegaly.  HEART:  Regular rate and rhythm without murmur or rub.  LUNGS:  Clear to auscultation with symmetric breath sounds.  ABDOMEN:  Soft right now.  She has no tenderness, no guarding, no  rebound.  She is an obese woman with almost an apple-shaped abdomen.  EXTREMITIES:  She has good strength in all 4 extremities.  NEUROLOGIC:  Grossly intact.   LABORATORY DATA:  Sodium 139, potassium 3.9, chloride 103, CO2 27,  glucose 206, creatinine 1.13.  Her hemoglobin is 13, hematocrit 40,  white blood count 9100, platelet count 194,000.  Her D-dimer was 1.26.  Her lipase is 21.  Her liver functions show a bilirubin of 1.0, alkaline  phosphatase 70, total protein 6.1.   Again, she had a CT scan on June 29, 2008, and this showed  cholelithiasis with layering gallstones, a hiatal hernia of moderate   size, and some scoliosis of her lumbar vertebra.   DIAGNOSES:  1. Cholelithiasis.  Question whether this is symptomatic or not.  She      has evidence of these gallstones, at least by the medical record,      since July 2008, but the patient says she just found out about it      at this time.  It is unclear if her current symptoms were biliary,      but a hepatobiliary scan would help clarify this.  If she has an      abnormal hepatobiliary scan, I would consider elective      cholecystectomy, but she will need cardiac clearance first and have      Dr. Everardo All review her diabetes/medical issues.  I discussed the      option of surgery with her  and will be happy to see her in the      office for further discussion.  2. History of chronic diastolic nonischemic heart failure.  She will      need cardiac clearance before any planned procedure.  3. Hypertension.  4. History of pulmonary hypertension.  5. Obstructive sleep apnea.  6. Insulin-dependent diabetes mellitus.  7. Hyperlipidemia.  8. Morbid obesity.  9. History of stricture of distal esophagus, which she is doing well      with.  10.History of prior craniotomies for questionable subdural in the      1990s.  11.Hiatel hernai by CT scan.  12.Scoliosis of lumbar vertebrae by CT scan.      Sandria Bales. Ezzard Standing, M.D.  Electronically Signed     DHN/MEDQ  D:  06/30/2008  T:  06/30/2008  Job:  [id number]   cc:   Thomas C. Wall, MD, FACC  [address]  [place]  [place] [address]   Gregary Signs A. Everardo All, MD  [address]  [place]  [place] [address]   Vikki Ports A. Felicity Coyer, MD  [address] [place], [place] [address]

## 2010-10-30 NOTE — Discharge Summary (Signed)
[name], [name]          ACCOUNT NO.:  [id number]   MEDICAL RECORD NO.:  [id number]          PATIENT TYPE:  INP   LOCATION:  3741                         FACILITY:  [facility]   PHYSICIAN:  Thomas C. Wall, MD, FACCDATE OF BIRTH:  24-Dec-1929   DATE OF ADMISSION:  12/23/2006  DATE OF DISCHARGE:  12/24/2006                               DISCHARGE SUMMARY   PROCEDURES:  None.   DISCHARGE DIAGNOSIS:  Chest pain.  Cardiac enzymes negative for  myocardial infarction and followup with Dr. Daleen Squibb arranged.   SECONDARY DIAGNOSES:  1. Chronic noncardiac chest pain.  2. Hypertension with a history of pulmonary hypertension.  3. Chronic diastolic nonischemic heart failure.  4. History of noncompliance.  5. Chronic kidney disease stage III with the GFR of 448 at discharge.  6. Obstructive sleep apnea.  7. Diabetes.  8. Depression.  9. Hyperlipidemia.  10.Morbid obesity.  11.Gastroesophageal reflux disease.  12.Diabetic neuropathy.  13.History of vertigo and dizziness.  14.Asthma.  15.Cholelithiasis.  16.Status post craniectomy with burr holes.  17.Esophagogastroduodenoscopy with dilatation secondary to esophageal      stricture.  18.Allergy or intolerance to intravenous dye.  19.Abnormal TSH at 5.703, free T3 and T4 pending at the time of      dictation.   TIME AT DISCHARGE:  33 minutes.   HOSPITAL COURSE:  [name] is a 77 year old female with a history of  chronic diastolic heart failure and hypertension.  She had approximately  3-day history of diarrhea.  She got lightheaded and developed chest pain  on the day of admission.  She took a sublingual nitroglycerin, and she  became hypotensive with a systolic blood pressure in the 60s.  This  improved with the fluid bolus.  She was admitted for observation  overnight.   Her diarrhea had resolved.  A stool culture is pending but a C. diff  toxin was negative and fecal lactoferrin level was also negative.  She  was not anemic  with a hemoglobin of 13.1 and hematocrit of 39.2.  Initially, her creatinine was slightly elevated at 1.78, but with  hydration, it has improved to 1.3.  Her CK-MBs were slightly elevated  between 209 and 258 with an MB between 5 and 5.8.  However, the index  was within normal limits, and the troponins were all negative.  A lipid  profile showed a total cholesterol of 143, triglycerides 134, HDL 47,  LDL 69.  A TSH is slightly elevated and a free T3 and T4 are pending.   On December 24, 2006, [name] was evaluated by Dr. Daleen Squibb.  He considered  her stable for discharge with outpatient followup arranged.   DISCHARGE INSTRUCTIONS:  Her activity level is to be increased  gradually.  She is to followup with Dr. Daleen Squibb on July 18 at 11 a.m. and  with Dr. Everardo All as needed.  She is to stick to a low fat diabetic diet.  She is to weigh herself daily.   DISCHARGE MEDICATIONS:  1. 75/25 sliding scale insulin up to 140 units a.m. and 70 units p.m.      per Dr. Everardo All.  2.  Advair b.i.d.  3. Lipitor 80 mg daily.  4. Hydralazine 50 mg t.i.d.  5. Avapro 300 mg a day.  6. Lasix 80 mg a day.  7. Potassium 20 mEq a day.  8. Labetalol 100 mg daily.  9. Aspirin 325 mg daily.  10.Celexa 20 mg daily.  11.Metolazone 2.5 mg daily.  12.Phenergan 25 mg p.r.n.      Theodore Demark, PA-C      Jesse Sans. Daleen Squibb, MD, [facility]  Electronically Signed    RB/MEDQ  D:  12/24/2006  T:  12/25/2006  Job:  [id number]   cc:   Gregary Signs A. Everardo All, MD

## 2010-10-30 NOTE — Assessment & Plan Note (Signed)
[facility]                            CARDIOLOGY OFFICE NOTE   [name], [name]                 MRN:          [id number]  DATE:09/17/2007                            DOB:          [birth year]    [name] returns today for management of the following issues:   1. Poorly-controlled hypertension.  2. Chronic diastolic congestive heart failure.  3. Hyperlipidemia followed by Dr. Everardo [name].  4. Diabetes followed by Dr. Everardo [name].   She seems to be fairly compliant.  Her weight has actually stayed the  same since last visit in October at 237.   Her story is that she had some chest pain recently.  She says she had a  CAT scan ordered by Dr. Everardo [name] to rule out a pulmonary embolus.  She  said this was negative.  She is following up with him tomorrow.   She has no other complaints for me except she does have some mild edema.  She denies any orthopnea, PND or increased peripheral edema.   MEDICATIONS:  1. Metolazone 2.5 mg a day.  2. Insulin.  3. Potassium 20 mEq a day.  4. Avapro 300 mg a day.  5. Advair 2 puffs b.i.d.  6. Aspirin 325 mg a day.  7. Hydralazine 50 mg p.o. t.i.d.  8. Citalopram 20 mg.  9. Lasix 80 mg q.a.m.  10.Labetalol 400 mg b.i.d.  11.Crestor 20 mg nightly.  12.Isosorbide 30 mg p.o. b.i.d.  13.Vitamin D 400 units a day.   PHYSICAL EXAMINATION:  VITAL SIGNS:  Blood pressure 168/80, pulse is 86  and regular.  Her weight is 237.  HEENT:  Normocephalic, atraumatic.  PERRLA.  Extraocular movements  intact.  Face is puffy.  Sclerae is slightly injected.  Facial symmetry  is normal.  NECK:  Supple.  Carotid upstrokes are equal bilaterally without bruits,  no JVD.  Thyroid is not enlarged.  LUNGS:  Clear to auscultation.  HEART:  A poorly appreciated PMI.  She has normal S1 and S2 without  obvious gallop.  ABDOMEN:  Obese with good bowel sounds.  Organomegaly could not be  assessed.  EXTREMITIES:  Trace to 1+ pitting  edema.  Pulses were present, but  reduced.  NEUROLOGIC:  Grossly intact.   [name] EKG shows normal sinus rhythm with LVH with left axis  deviation.   ASSESSMENT/PLAN:  [name] is doing relatively well compared to her  past blood pressure control.  She is not having any chest pain.  She is  on a fairly complex medical program and it is very expensive.  I have  chosen not to change anything today.  She will follow up with Dr.  Everardo [name] concerning her metabolic and endocrine issues.   I have given her some samples of Crestor for a month.  Hopefully, it  will help her out a little bit.  We will see her back again in 6 months.     Thomas C. Daleen Squibb, MD, [facility]  Electronically Signed    TCW/MedQ  DD: 09/17/2007  DT: 09/17/2007  Job #: [id number]   cc:   Christina Signs A. Everardo All, MD

## 2010-10-30 NOTE — Discharge Summary (Signed)
[name], [name]          ACCOUNT NO.:  [id number]   MEDICAL RECORD NO.:  [id number]           PATIENT TYPE:   LOCATION:                                 FACILITY:   PHYSICIAN:  Madolyn Frieze. Jens Som, MD, FACCDATE OF BIRTH:  [birth year]   DATE OF ADMISSION:  DATE OF DISCHARGE:                               DISCHARGE SUMMARY   PROCEDURE:  V/Q scan.   PRIMARY FINAL DISCHARGE DIAGNOSIS:  Shortness of breath.   SECONDARY DIAGNOSES:  1. Nonobstructive coronary artery disease with lesions between 20 and      60% in the left anterior descending, circumflex, and right coronary      artery.  2. Chronic diastolic congestive heart failure.  3. Obesity.  4. Hypertension.  5. Hyperlipidemia.  6. Diabetes.  7. Asthma/chronic obstructive pulmonary disease.  8. Obstructive sleep apnea.  9. Osteoarthritis.  10.Diabetic retinopathy and neuropathy.  11.Peripheral vascular disease.  12.Depression.  13.History of subdural hematoma with craniotomy.  14.Status post cholecystectomy and bilateral tubal ligation, left knee      arthroscopy, cervical and lumbar surgeries, as well as bilateral      cataract surgery.  15.Allergy or intolerance to NORVASC and IV DYE.   TIME AT DISCHARGE:  48 minutes.   HOSPITAL COURSE:  [name] is a 77 year old female with a history of  nonobstructive coronary artery disease.  About a week ago, she had a  cortisone injection in her knee.  She came to the hospital on the day of  admission with increasing shortness of breath.  She was hypertensive  with a systolic blood pressure greater than 200.  She was admitted for  further evaluation.   Her CK-MBs were mildly elevated with a peak of 204/6.8 with an index of  3.3.  her troponin was mildly elevated at 0.09.  BNP 183.  Total  cholesterol 191, triglycerides 109, HDL 52, LDL 117.  TSH was within  normal limits at 3.387.  Hemoglobin A1c was 10.  She had some mild renal  insufficiency with a BUN of 12,  creatinine 1.17, and GFR of 54 on  admission, BUN 28, creatinine 1.54, and GFR of 48 at discharge after  receiving some IV Lasix.   Her chest x-ray showed mild cardiac enlargement but no process.  Lower  extremity Dopplers and V/Q scan were performed to rule out PE.  The  Dopplers were negative for DVT, and the V/Q scan was very low  probability for PE.   On November 16, 2008, [name] was seen by Dr. Jens Som.  Her blood  pressure was under better control on an increased dose of hydralazine.  Dr. Jens Som felt that her cardiac enzymes were nondiagnostic.  He felt  that she could be safely discharged home in stable to improved condition  with close outpatient followup.   DISCHARGE INSTRUCTIONS:  1. Her activity level is to be increased gradually.  2. She is to stick to a low-sodium diabetic diet.  3. She is to follow up with Dr. Vern Claude PA on November 23, 2008, at 12:45      with a BMET.  4. She is  to follow up with Dr. Daleen Squibb, Dr. Everardo All, Dr. Luiz Blare, and Dr.      Selena Batten as scheduled.   DISCHARGE MEDICATIONS:  1. 75/25 insulin 75 units b.i.d.  2. Potassium chloride 20 mEq b.i.d.  3. Isordil 30 mg b.i.d.  4. Advair Diskus 100/50 mcg 1 puff b.i.d.  5. Labetalol 100 mg b.i.d.  6. Lasix 80 mg daily.  7. Tramadol 50 mg one-half tablet q.6 h. p.r.n.  8. Metolazone 5 mg daily.  9. Crestor 40 mg a day.  10.Hydrocodone 5 mg 1-2 q.6 h. p.r.n.  11.Hydralazine 25 mg t.i.d.  12.Hydralazine 50 mg t.i.d.      Theodore Demark, PA-C      Madolyn Frieze. Jens Som, MD, [facility]  Electronically Signed    RB/MEDQ  D:  11/16/2008  T:  11/16/2008  Job:  [id number]   cc:   Massie Maroon, MD  Harvie Junior, M.D.  Sean A. Everardo All, MD

## 2010-10-30 NOTE — Consult Note (Signed)
[name], [name]          ACCOUNT NO.:  [id number]   MEDICAL RECORD NO.:  [id number]          PATIENT TYPE:  OBV   LOCATION:  0108                         FACILITY:  [facility]   PHYSICIAN:  Sandria Bales. Ezzard Standing, M.D.  DATE OF BIRTH:  [birth year]   DATE OF CONSULTATION:  09/19/2008  DATE OF DISCHARGE:                                 CONSULTATION   Date of Consultation - 19 August 2008   REASON FOR CONSULTATION:  Gallbladder disease.   HISTORY OF ILLNESS:  This is a 77 year old black female who I originally  saw in January of 2010.  She sees Dr. Romero Belling from a primary  medical standpoint and Dr. Juanito Doom from a cardiac standpoint.  She was  found to have gallstones.  In fact, there is evidence of gallstones on  old x-rays dating back to July of 2008.  She came in with abdominal pain  at that time.   She had seen Dr. Claudette Head before for esophageal stricture which had  been dilated in the past.  Her last colonoscopy was three years ago..   During the hospitalization in January, she had a hepatobiliary scan  which showed a 6% ejection fraction.  She also underwent a cardiac  catheterization on July 04, 2008, by Dr. Gala Romney, and this showed  nonobstructive coronary artery disease and severe hypertension.   She was felt reasonably stable to proceed with surgery, so we scheduled  her for an elective laparoscopic cholecystectomy on September 01, 2008, at  [facility].  Unfortunately, she presented to the hospital hypotensive  and hyperglycemic, and it was felt the safe thing would be to cancel  surgery until she was medically better controlled.   She says she last saw Dr. Everardo All last Monday which would have been the  first of April.   She did well until last evening when she developed a sort of diffuse  abdominal pain again, and she came to the [facility] Room for  abdominal pain that lateralized more to the right side of her abdomen.   An ultrasound was  obtained which showed multiple gallstones but no  gallbladder wall thickening and a common bile duct of 8 mm, on the upper  limits of normal.   So the ER physician, Dr. Javier Glazier, spoke with Dr. Illene Regulus who is  covering for Dr. Everardo All and spoke to me in regards to seeing the  patient.   PAST MEDICAL HISTORY:   ALLERGIES:  SHE IS ALLERGIC TO IVP DYE.   MEDICATIONS:  Her admission medicines included:  1. Nitroglycerin.  2. Norvasc.  3. Aspirin.  4. Avapro.  5. Citalopram.  6. Hydralazine.  7. Isosorbide.  8. Potassium.  9. Labetalol.  10.Lasix.  She is a diabetic on insulin.   REVIEW OF SYSTEMS:  NEUROLOGIC:  She had some type of a blood clot on  her brain which was evacuated by Dr. Trey Sailors, she thinks in about 1997  or 1998.  She thought this was bilateral.  She does not think she has  any neurologic deficits since then.  PULMONARY:  She has chronic sleep apnea  but is not on CPAP.  She does  not smoke cigarettes.  CARDIAC:  She has had chronic diastolic heart failure with significant  hypertension and a history of pulmonary hypertension.  She underwent a  cardiac catheterization on July 04, 2008, the summary of results  showed nonobstructive coronary artery disease but severe hypertension.  GASTROINTESTINAL:  She has had esophageal dilatation for a stricture in  the past but has done well since that time.  She did have colonoscopy by  Dr. Russella Dar about three years ago.  UROLOGIC:  She has had a history of renal insufficiency in the past but  actually her creatinine on admission today was 1.18.  EXTREMITIES:  She has a history of swelling of her lower extremities.  She has never had any DVT.   PERSONAL HISTORY:  She is married.   PHYSICAL EXAM:  VITAL SIGNS:  Her temperature is 97.3, blood pressure  180/69, pulse 70, respirations 15, and saturation 98%.  GENERAL:  She is a well-nourished, if not obese, black female.  HEENT:  Unremarkable and no oral lesions.  NECK:   Supple with no mass or thyromegaly.  LUNGS:  Clear to auscultation.  HEART:  Regular rate and rhythm.  I hear no murmur or rub.  ABDOMEN:  She has some vague soreness in her mid-abdomen; it may be a  little more right-sided than left-sided, but she certainly has no  guarding, no rebound, and no abdominal masses, but she is obese which  limits the exam.  EXTREMITIES:  She has good strength in all four extremities.  NEUROLOGIC:  Intact in motor and sensory function.   LABORATORY DATA:  Labs that I have show a white blood count of 9000,  hemoglobin of 15, hematocrit of 46, and platelet count of 235,000.  Her  PT is 12.7 and PTT of 33.  Her troponin is 0.05.  Her CK-MB 3.2.  Her  sodium is 140, potassium 3.0, chloride of 101, glucose of 91, BUN of 22,  creatinine of 1.18.  Her lipase is 26.  Her urine is negative.   IMPRESSION:  1. Probable recurrent symptomatic gallbladder disease but no evidence      acute cholecystitis.  I have discussed this with Dr. Debby Bud.  Because her symptoms are a little atypical for cholecystitis, she will  get another CAT scan of her abdomen.  If this shows some other disease,  then we will pursue that.  If it is negative, then consider  cholecystectomy during this admission.  I discussed with the patient the indications and potential complications  of gallbladder surgery.  I have been over this with her several times  before.  Potential risks of gall bladder surgery  include, but are not  limited to, bleeding, infection, common or bile duct injury, and that  removing her gallbladder may not take care of his symptoms.  She is  obese which she knows the increased risk of the surgery as higher than  normal with her heart disease, lung disease, diabetes, and possibility  to do an open surgery.  [Her CT abdomen/pelvis showed no other source of pain.  We proceeded  with a laparoscopic cholecystectomy. DN]  1. Chronic diastolic nonischemic heart disease with a  cardiac      catheterization which shows no significant coronary artery disease      on July 04, 2008.  2. Hypertension which has been __________ control.  3. History of pulmonary hypertension.  4. Obstructive sleep apnea.  5. Insulin-dependent  diabetes mellitus.  6. Hyperlipidemia.  7. Morbid obesity.  8. History of distal esophageal stricture which she is having trouble.  9. History of prior craniotomies for questionable subdural in the      1990s.      Sandria Bales. Ezzard Standing, M.D.  Electronically Signed     DHN/MEDQ  D:  09/19/2008  T:  09/19/2008  Job:  [id number]   cc:   Gregary Signs A. Everardo All, MD  [address]  [place]  [place] [address]   Rosalyn Gess. Norins, MD  [address]  [place]  [place] [address]   Jesse Sans. Wall, MD, FACC  [address]  [address]  [place]  [place] [address]   Venita Lick. Russella Dar, MD, FACG  [address]  [place]  [place] [address]

## 2010-10-30 NOTE — Assessment & Plan Note (Signed)
[facility]                            CARDIOLOGY OFFICE NOTE   EVALISE, ABRUZZESE                 MRN:          [id number]  DATE:03/27/2007                            DOB:          1929-08-21    Ms. Carrier comes in today for further management of her chronic  diastolic congestive heart failure and poorly controlled hypertension.  She is followed by Dr. Romero Belling for her diabetes, hyperlipidemia.   Her lipids recently looked good, and she is presently on Lipitor 80 mg a  day per our chart.  I had suggested simvastatin to save her some money  on last visit.   She seems to be compliant with her medications.   Unfortunately, hemoglobin A1c is 9.4%.   Her meds are outlined in the chart and are quite numerous.  Please refer  to the maintenance medication list.   She is in no acute distress today.  SKIN:  Warm and dry.  She is alert and oriented x3.  Her blood pressure is 156/72 with a large cuff in the left arm.  Her  weight is 237.  HEENT:  Unchanged.  NECK:  Shows no obvious JVD.  Carotid upstrokes are equal bilaterally  without bruits.  No thyromegaly.  LUNGS:  Clear to auscultation.  HEART:  PMI could not be appreciated.  Heart reveals a regular rate and  rhythm without gallop.  ABDOMINAL EXAM:  Obese.  EXTREMITIES:  Reveal no edema.  Pulses are present.   Ms. Parisi is doing relatively well with her blood pressure control.  Her diabetes is not well controlled.  I reinforced this today, and she  will follow up with Dr. Everardo All.  We will plan on seeing her back again  in 6 months.     Thomas C. Daleen Squibb, MD, [facility]  Electronically Signed    TCW/MedQ  DD: 03/27/2007  DT: 03/27/2007  Job #: [id number]

## 2010-10-30 NOTE — H&P (Signed)
NAMEKRISTIA, [name]          ACCOUNT NO.:  [id number]   MEDICAL RECORD NO.:  [id number]          PATIENT TYPE:  INP   LOCATION:  1404                         FACILITY:  [facility]   PHYSICIAN:  Unice Cobble, MD     DATE OF BIRTH:  Jul 28, 1929   DATE OF ADMISSION:  11/14/2008  DATE OF DISCHARGE:                              HISTORY & PHYSICAL   CARDIOLOGIST:  Maisie Fus C. Wall, MD, [facility]   CHIEF COMPLAINTS:  Shortness of breath.   HISTORY OF PRESENT ILLNESS:  This is a 77 year old African American  female with a history of nonobstructive coronary artery disease who  presents with shortness of breath.  She has been short of breath for 3-4  days.  She tells me she has not had any change to her diet and the  shortness of breath has been insidious in onset.  She is still doing her  normal housework but has to sit down to rest.  She has a few chest pains  every now and then but no instigating factors are associated with this.  No diaphoresis.  No palpitations.  No presyncope.  She claims compliance  with medications.  On admission, her blood pressure was 206/80.   PAST MEDICAL HISTORY:  1. Obesity.  2. Diastolic dysfunction.  3. Hypertension.  4. Hyperlipidemia.  5. Diabetes mellitus.  6. COPD.  7. Asthma.  8. Obstructive sleep apnea.  9. Nonobstructive coronary artery disease with last cath done in      January 2010 showing a 40% and 20% lesions in the LAD, 40% and 20%      lesions in the circumflex, and a RCA lesion in the proximal portion      which was 60% -70%.  10.Osteoarthritis.  11.Diabetic retinopathy.  12.Diabetic neuropathy.  13.Cholelithiasis status post cholecystectomy earlier this year.  14.Peripheral vascular disease.  15.Depression.  16.Bilateral tubal ligation in 1967.  17.Left craniotomy for subdural hematoma 1997.  18.Left knee scope.  19.Bilateral cataract surgery 2005.  20.Cervical spine and lumbar surgeries.   ALLERGIES:  NORVASC AND IV CONTRAST DYE.   MEDICATION:  1. Metolazone 5 mg daily.  2. Lasix 80 mg daily.  3. Humalog 70/30 on a sliding scale.  4. KCl 20 mEq b.i.d.  5. Avapro 3 mg daily.  6. Isosorbide dinitrate 30 mg b.i.d.  7. Advair discus 100/50 mcg 1 puff b.i.d.  8. Aspirin 325 mg daily.  9. Hydralazine 50 mg t.i.d.  10.Labetalol 200 mg b.i.d.  11.Crestor 40 mg daily.  12.Nitroglycerin p.r.n.  13.Phenergan 25 mg p.r.n.  14.Tramadol 25 mg q.4 hours p.r.n.  15.Vicodin 5/500 mg p.r.n.   SOCIAL HISTORY:  She does not smoke or use drugs.  Occasional alcohol.   FAMILY HISTORY:  Noncontributory.   REVIEW OF SYSTEMS:  She tells me she has fevers and chills off and on.  She has poor appetite.  No coughing or wheezing.  No rashes.  No  headaches or visual changes.  She is urinary leakage but no dysuria.  She has numb feet.  She has arthralgias in her hip, back and knees for  which she takes Vicodin and  tramadol.  No rectal bleeding.  No change in  her bowel habits.  No heat or cold intolerance.   PHYSICAL EXAMINATION:  Temperature is 98.2 pulses 75, respiratory rate  18, blood pressure 206/80 down to 173/88 during ER stay.  GENERAL:  She is in no acute distress.  Morbidly obese.  HEENT: Shows PERRLA, EOMI, MMM; oropharynx without erythema or exudate.  NECK: Supple without lymphadenopathy, thyromegaly, bruits or jugular  venous distention.  HEART:  Has a regular rate and rhythm with a normal S1 and S2.  No  murmurs, gallops or rubs.  Normal PMI.  Pulses 2+ and equal bilaterally  without bruits.  LUNGS: Clear to auscultation bilaterally.  SKIN:  No rashes or lesions.  ABDOMEN:  Soft and nontender with normal bowel sounds.  No rebound or  guarding.  EXTREMITIES:  Show no cyanosis, clubbing or edema.  MUSCULOSKELETAL:  Shows no joint deformity or effusions.  No spine or  CVA tenderness.  NEUROLOGICAL:  She is alert and oriented x3 with cranial nerves II-XII  grossly intact.  Strength is 5/5 all extremities and axial  groups.  Normal sensation throughout.   Radiology shows chest x-ray with mild cardiomegaly with normal lungs.  EKG shows rate of 75, normal sinus rhythm with PVCs.  She has Qs in V1  and V2.  She has lateral T-wave inversions which are old.  There is not  much change from prior although the Q-waves in V1 and V2 appear to be  new.   LABORATORY DATA:  White count is 8.2 with a hemoglobin of 15.8.  Her  glucose is 263 with a creatinine of 1.1 and BUN of 17.  Her D-dimer is  elevated at 1.47.  Her BNP is 183, CK is 204 with a CK-MB of 6.8 and  troponin of 0.09, all of which are elevated.   ASSESSMENT/PLAN:  This a 77 year old African American female with  diabetes mellitus, hypertension, hyperlipidemia, obstructive sleep  apnea,  and nonobstructive coronary artery disease by catheterization in  January of this year who presents with shortness of breath and  hypertensive urgency.  The patient's blood pressure is elevated to the  point where I believe cardiac strain his causing her increased BNP and  troponin.  I suppose her shortness of breath also could be due to some  new onset congestive heart failure in the setting of this.  I will  reinstate her home medications and increase her hydralazine 75 mg t.i.d.  as she claims compliance.  She could also have an occult reason for her  hypertension and I will check a renal ultrasound to look for renal  artery stenosis.  An echocardiogram will be checked for right heart  strain considering her D-dimer is positive, for pulmonary hypertension  secondary to obstructive sleep apnea, and for her overall LV function.  As the D-dimer is positive, she will also be receive a V/Q scan in the  morning.  Heparin drip is warranted for possible pulmonary embolism or  myocardial infarction for least 48 hours.  Insulin sliding scale will be  implemented for her diabetes mellitus.      Unice Cobble, MD  Electronically Signed     ACJ/MEDQ  D:   11/15/2008  T:  11/15/2008  Job:  [id number]
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
## 2010-11-02 NOTE — H&P (Signed)
[place]  Patient:    [name]                  MRN: [id number] Adm. Date:  [id number] Attending:  Silvestre Mesi Dictator:   Donzetta Matters, P.A.                         History and Physical  DATE OF BIRTH:  [birth year].  CHIEF COMPLAINT: Chest pain.  HISTORY OF PRESENT ILLNESS:  A 77 year old female with chest pain radiating to er right side, sometimes worse with movement.  She does take nitroglycerin more than she has been lately.  She has had no nausea, no vomiting, some shortness of breath associated with this.  ALLERGIES:  No known drug allergies.  CURRENT MEDICATIONS: 1. Glucophage 500 b.i.d. 2. Prilosec 20 mg daily. 3. Lasix 80 mg daily. 4. Norvasc 10 mg daily. 5. She is on a regimen of insulin at home of Humalog insulin 30 units in a.m.;    30 units at lunch; 50 units at night. 6. Actos 30 mg tablet one-half tablet daily.  PAST MEDICAL HISTORY:  Has had knee arthroscopy surgery three months ago, it has caused some swelling into her left leg.  Her last heart catheterization July 1995 with normal coronary arteries. Normal stress test done and a Persantine Cardiolite three years ago that was normal.  She has known hypertension, elevated cholesterol, diabetes, insulin-dependent.  Also is being treated for insomnia.  FAMILY HISTORY:  Strongly positive for coronary artery disease.  SOCIAL HISTORY:  She is here with her husband.  No smoking, no alcohol use.  REVIEW OF SYSTEMS:  She states she had some chills when this occurred.  Her weight has remained stable.  Continues to have swelling, especially in her left leg. he has been sleeping well at night.  Does not wear glasses.  States she has had no  special dizziness lately.  Chest pressure today with shortness of breath.  Does  require some pillows to sleep on.  Denies any cough or wheezing.  Denies any heartburn.  No nausea, vomiting, no diarrhea, no  change in stools.  Denies any urinary problems.  Does have some joint pain, especially in her left knee. Does have some generalized rash to her back which she has used some topical cortisone cream to.  Neurological exam is no paresthesias, no memory changes.  Denies any  faintness or syncope.  She has not had any problems with eating.  Denies any thyroid symptoms, no excessive thirst, no excessive hunger. There has been no easy bruising.  She denies any nondrug allergies.  PHYSICAL EXAMINATION:  VITAL SIGNS:  Temperature is 98.8, pulse is 68 beats per minute, respiratory rate 18, blood pressure is 214/94.  GENERAL:  Obese female, presently resting comfortably in ED.  HEENT:  She has pink, moist mucosa.  Face is equal.  NECK:  Supple.  There is no JVD, no bruits, no cervical adenopathy.  CHEST:  Distant breath sounds.  No obvious wheezing.  Pendulous female breasts. No obvious discharge.  HEART:  Regular rate and rhythm.  No murmurs.  ABDOMEN:  Obese, active bowel sounds, no abdominal bruits.  GENITOURINARY:  Nontender over the bladder.  EXTREMITIES:  Trace edema to the left pretibial area.  Good pulses are noted at  radius pulse, 1+ pulse at dorsalis pedis.  SKIN:  No obvious rashes noted.  No cyanosis or clubbing to her  fingers.  DIAGNOSTIC DATA:  Chest x-ray is under penetrated with cardiomegaly, increased vascular markings.  CBC shows white count 7600, hemoglobin 14.3, hematocrit 43.6, platelet count 291, total CK is 405.  Lipase is 36.  Protime is 12.3 with INR of 0.9.  PTT of 29 sec. Comprehensive metabolic panel; sodium is 137, potassium 3.7, chloride 104, CO2 8, glucose 171, BUN 16, creatinine 0.8.  Normal liver function tests.  IMPRESSION: 1. Chest pain with positive enzymes. 2. Diabetes, insulin-dependent.  PLAN:  Repeat enzymes, nitroglycerin drip, heparin drip.  Repeat EKG in the a.m. Repeat chest x-ray, PA and lateral and evaluate by Dr. Alanda Amass  in a.m. for possible catheterization. DD:  04/30/99 TD:  04/30/99 Job: [id number]

## 2010-11-02 NOTE — Discharge Summary (Signed)
[name], [name]          ACCOUNT NO.:  [id number]   MEDICAL RECORD NO.:  [id number]          PATIENT TYPE:  INP   LOCATION:  2024                         FACILITY:  [facility]   PHYSICIAN:  Jesse Sans. Wall, MD, FACCDATE OF BIRTH:  01-30-1930   DATE OF ADMISSION:  09/01/2006  DATE OF DISCHARGE:  09/04/2006                               DISCHARGE SUMMARY   PRIMARY CARE PHYSICIAN:  Dr. Romero Belling.   PRIMARY CARDIOLOGIST:  Dr. Valera Castle.   PROCEDURES PERFORMED DURING HOSPITALIZATION:  None.   PRIMARY DISCHARGE DIAGNOSES:  1. Noncardiac chest pain.  2. Hypertension.  3. Bradycardia.   SECONDARY DIAGNOSES:  1. Chronic renal insufficiency (stable).  2. Diabetes mellitus x2.  3. Borderline potassium level.  4. History of pulmonary hypertension in the setting of obstructive      sleep apnea.  5. History of diastolic dysfunction.  6. Coronary artery disease.  The patient did have a cardiac      catheterization December of 2007 that showed no source of ischemia.      She does have a history of diastolic heart failure.  7. Asthma.  8. Diabetic neuropathy.  9. History of vertigo and dizziness.  10.Hyperlipidemia.  11.Depression.  12.IVP DYE ALLERGY.  13.Gastroesophageal reflux disease.   HISTORY OF PRESENT ILLNESS:  This is a 77 year old obese, African  American female, who was in her usual state of health until day of  admission when she began having substernal chest discomfort radiating up  to her neck and down her right arm and her right arm became numb.  She  described the discomfort in her chest as sharp, worse with deep  breathing.  She also had a little bit of dizziness and some shortness of  breath.  The patient put her CPAP on and did not have any relief, so she  called Dr. Vern Claude office and was told to call 9-1-1 and the patient was  brought to [facility].   HOSPITAL COURSE:  The patient was seen and examined by Dr. Clelia Croft, fellow  for [facility], and by Dorian Pod, adult nurse  practitioner.  The patient was found to have a blood pressure of 96/50  with respirations of 20 and a pulse of 50.  The patient had been given  oxygen in route, nitroglycerin and aspirin in route.  On examination,  the patient's chest pain was found to be reproducible with palpation.  She was admitted to rule out cardiac etiology for chest discomfort.  The  patient's diuretics and potassium were on hold for the first 24 hours  with followup per Dr. Daleen Squibb the following day.  It was noted that the  patient's D-dimer was elevated, but was suspected to be a false reading  and a CT scan was not indicated secondary to contrast allergy.   The patient was seen and followed by Dr. Daleen Squibb the following day.  The  patient was found to have some sinus bradycardia with elevated blood  pressure in the 140s-160s.  The patient's blood work remained  essentially normal with negative cardiac enzymes throughout  hospitalization.  Dr.  Wall suspected medical noncompliance secondary to  elevated blood pressures, which are much better during hospitalization  and medication institution.  The patient was continued on her home  medications with careful evaluation and blood pressure control during  the hospitalization.  The patient was found to have some mild  hypokalemia with a potassium of 3.2 during hospitalization, this was  repleted.  The patient did have some mild bradycardia and her labetalol  was changed from 200 mg to 100 mg once a day secondary to this.  On day  of discharge, the patient was seen and examined by Dr. Valera Castle.  At  that time, the patient's blood pressure was 121/50, a heart rate of 48.  When the patient is up and walking, her heart rate was between 55 and  73.  She was asymptomatic without complaints of any dizziness during the  bradycardia.  The patient was found to be stable for discharge.  The  patient is to followup at Dr.  Vern Claude request with diabetic consult as an  outpatient and to see him in 2 weeks postdischarge.   DISCHARGE LABS:  Hemoglobin 12.3, hematocrit 36.8, white blood cells  8.5, platelets 243.  Sodium 138, potassium 3.5, chloride 101, CO2 29,  BUN 41, creatinine 1.66, glucose 180.  Liver enzymes:  AST 22, ALT 21,  alkaline phosphatase 71, total bili 0.9, lipase 32, albumin 3.8.  Troponin 0.05, CK 304, CK-MB 5.3 (dated September 02, 2006).  Calcium 9.3.  EKG revealed sinus bradycardia with a ventricular rate of 52 beats per  minute with some nonspecific ST/T-wave abnormalities.   DISCHARGE PLANS AND APPOINTMENTS:  1. Patient is scheduled to see Dr. Valera Castle on April 4 at 11:15 at      a previously scheduled appointment prior to admission.  2. The patient is to follow up with her primary care physician, Dr.      Romero Belling, she is to call to make this appointment.  3. Diabetic consult as an outpatient has been instituted and they will      get in touch with her as an outpatient for diabetic teaching and      maintenance.  4. The patient has been advised to continue her current medication      regimen and patient has been encouraged to be very medically      compliant to avoid rehospitalization and monitoring of blood      pressure and blood glucose.   DISCHARGE MEDICATIONS:  1. Insulin 75/25, 140 units in the morning, 70 units in the p.m.  2. Isosorbide dinitrate 30 mg daily.  3. Advair 250/50 twice a day.  4. Lipitor 80 mg at bedtime.  5. Hydralazine 50 mg 3 times a day.  6. Avapro 300 mg daily.  7. Lasix 80 mg daily.  8. Potassium 20 mEq daily.  9. Labetalol 100 mg daily.   ALLERGIES:  TO CONTRAST MEDIA.   Time spent with the patient, to include physician time, 35 minutes.      Bettey Mare. Lyman Bishop, NP      Jesse Sans. Daleen Squibb, MD, [facility]  Electronically Signed    KML/MEDQ  D:  09/04/2006  T:  09/04/2006  Job:  [id number]  cc:   Gregary Signs A. Everardo All, MD

## 2010-11-02 NOTE — H&P (Signed)
[name], COUSAR          ACCOUNT NO.:  [id number]   MEDICAL RECORD NO.:  [id number]          PATIENT TYPE:  INP   LOCATION:  2010                         FACILITY:  [facility]   PHYSICIAN:  Sean A. Everardo All, MD    DATE OF BIRTH:  [birth year]   DATE OF ADMISSION:  06/11/2006  DATE OF DISCHARGE:                              HISTORY & PHYSICAL   REASON FOR ADMISSION:  Chest pain.   HISTORY OF PRESENT ILLNESS:  This is a 77 year old woman with several  hours of slight shortness of breath.  She has associated intermittent  chest pain radiating to the right upper extremity.  It is possibly  worsened in the context of movement of the right upper extremity, but  the patient is not certain about this.   PAST MEDICAL HISTORY:  1. Congestive heart failure.  2. Normal coronary arteriography in 2000, Dr. Alanda Amass.  3. Type 2 diabetes.  4. Hypertension.  5. Dyslipidemia.  6. Zoster several years ago.  7. Diabetes peripheral neuropathy.  8. Diabetes nephropathy.  9. Diffuse osteoarthritis.  10.Mild chronic obstructive pulmonary disease.  11.Diabetes retinopathy.  12.Mild asthma.  13.Sleep apnea.  14.Gallstones noted on ultrasound.  15.Idiopathic itch, diffuse pruritus.  16.Atherosclerosis noted on an MRI of the brain several years ago.  17.GERD with history of a stricture.  18.PAD.   MEDICATIONS:  1. K-Dur 20 mEq daily.  2. Crestor 40 mg daily.  3. Avapro 300 mg daily.  4. Lasix 80 mg twice daily.  5. Ismo 30 mg daily.  6. Celebrex 200 mg daily.  7. Normodyne 400 mg twice a day.  8. Hydralazine 25 mg 3 times a day.  9. Advair 2 puffs twice daily.  10.Humalog 75/25, 120 units every morning and 60 units every      afternoon.   SOCIAL HISTORY:  She is married.  She is retired.   FAMILY HISTORY:  Father had a pacemaker.   REVIEW OF SYSTEMS:  She has a slight cough.  There is no change in her  chronic anxiety and depression.  She denies the following:  Fever,  weight gain,  weight loss, syncope, rectal bleeding, hematuria, urinary  incontinence, decreased urinary force, and skin rash.   PHYSICAL EXAMINATION:  Blood pressure 117/54, heart rate is 58,  temperature is 97.6.  The weight is 247.  GENERAL:  Obese.  No distress.  SKIN:  No rash.  Not diaphoretic.  HEENT:  No proptosis, no periorbital swelling.  Pharynx:  No erythema.  NECK:  Supple, no goiter.  CHEST:  Clear to auscultation.  I do not appreciate rales today.  The  chest wall is nontender.  CARDIOVASCULAR:  No JVD.  There is trace bilateral pretibial edema.  Regular rate and rhythm, no murmur.  Pedal pulses are intact, and there  is no bruit at the carotid arteries.  ABDOMEN:  Soft, nontender, no hepatosplenomegaly, no mass.  BREASTS/GYNECOLOGIC/RECTAL EXAMINATION:  Not done at this time due to  patient condition.  EXTREMITIES:  Osteoarthritic changes, mild, are noted.  NEUROLOGIC:  Alert, well-oriented, does not appear anxious or depressed.  Cranial nerves appear to be intact,  and she readily moves all fours.  In  particular, movement of the right upper extremity is slightly painful to  the patient, but does not bring about chest pain.   ELECTROCARDIOGRAM:  She has T-wave inversions in the inferior and  lateral leads, but this is unchanged from earlier this year.   IMPRESSION:  1. Chest pain, uncertain etiology.  2. Shortness of breath, which could have several possible etiologies.  3. Type 2 diabetes.  4. Other chronic medical problems as noted above.   PLAN:  1. Admit to telemetry.  2. Consult cardiology.  3. Check D-dimer and cardiac enzymes.  4. Decrease insulin for safety.  5. Continue other outpatient medications.  6. I discussed CODE status with the patient.  She requests FULL CODE.      Sean A. Everardo All, MD  Electronically Signed     SAE/MEDQ  D:  06/11/2006  T:  06/12/2006  Job:  [id number]

## 2010-11-02 NOTE — Assessment & Plan Note (Signed)
[facility]                              CARDIOLOGY OFFICE NOTE   BRAILEY, BUESCHER                 MRN:          [id number]  DATE:02/11/2006                            DOB:          1929/08/11    Miss Rollyson returns today for further management for her hypertensive  crisis, recently discharged from [facility] December 11, 2005.   She had chest pain and her troponin was borderline positive.  Her CPKs and  MBs were negative.  Once we got her on a good medical program, her blood  pressure came down appropriately.  Noncompliance was a major issue.   She had normal coronary arteries by catheterization in 2001.  We decided not  to re-cath her at this time.   Her medicines are outlined in the maintenance medication list.  Her anti-  hypertensive drugs are hydralazine 25 mg p.o. t.i.d., metolazone 5 mg p.o.  q.a.m., potassium replacement 20 mEq q.a.m. which she has been out of and  needs a refill, labetalol 400 mg b.i.d. and Avapro 300 mg a day.   PHYSICAL EXAMINATION:  Her blood pressure today is 138/59, large cuff.  Her  pulse is 58 and sinus bradycardia.  She has ST segment changes laterally  which are old.  Carotids are full.  She has soft systolic sounds.  She has  previous nonobstructive plaque by carotid dopplers in 2006.  Lungs are  clear.  Heart reveals a regular rate and rhythm, no gallop.  Abdominal  examination reveals good bowel sounds.  Extremities revealed no edema.  Pulses were diminished.   ASSESSMENT AND PLAN:  Miss Iacovelli is doing well with her blood pressure  now that she is compliant.  She has been out of potassium and we are  renewing that by phone today.  Will check a BMP.  Assuming this is stable,  will see her back again in 6 months.                               Thomas C. Daleen Squibb, MD, [facility]    TCW/MedQ  DD:  02/11/2006  DT:  02/12/2006  Job #:  [id number]   cc:   Valetta Mole. Swords, MD

## 2010-11-02 NOTE — Op Note (Signed)
   NAME:  RAZIYAH, VANVLECK NO.:  [id number]   MEDICAL RECORD NO.:  [id number]                   PATIENT TYPE:  INP   LOCATION:                                       FACILITY:  [facility]   PHYSICIAN:  Harvie Junior, M.D.                DATE OF BIRTH:  1930/05/31   DATE OF PROCEDURE:  07/22/2002  DATE OF DISCHARGE:  07/24/2002                                 OPERATIVE REPORT   BRIEF HISTORY:  Ms. Imler is a 77 year old female who is admitted to the  hospital with complaints of right hip and back pain.  She ultimately was  evaluated on he medical service, and we were consulted for evaluation.  Her  evaluation at that time showed that she had significant trochanteric  bursitis and posterior hip pain on the right side and because of the  complaints of this area we ultimately felt she could be helped with  injection, and this was performed.   DESCRIPTION OF PROCEDURE:  The patient was lying on the left side.  The  appropriate location was established in the right hip and the area of  maximal clearance was identified.  Four of Xylocaine and 2 cc of 80 mg/mL  Depo-Medrol were instilled into this area for pain relief.  At that point  the prepped area was wiped dry and a Band-Aid was placed over this area.  The patient was noted to be in satisfactory condition.                                               Harvie Junior, M.D.    Ranae Plumber  D:  01/19/2003  T:  01/20/2003  Job:  [id number]

## 2010-11-02 NOTE — Cardiovascular Report (Signed)
[place]  Patient:    [name], [name]                 MRN: [id number] Proc. Date: 06/09/00 Adm. Date:  [id number] Attending:  Ruta Hinds CC:         Second floor [facility]  Justine Null, M.D. [facility]  [facility]. Eliott Nine, M.D.  [facility], [address] [place] [address]   Cardiac Catheterization  INDICATIONS:  [name] is a 77 year old moderately overweight black female with history of normal course one year ago, severe hypertension on multiple medications, insulin-dependent diabetes, who was admitted June 06, 2000 because of chest tightness and shortness of breath.  She had mildly elevated enzymes, which were "normal".  She had new unilateral T-wave inversion and was placed on IV nitroglycerin and heparin.  She presents now for diagnostic coronary arteriography.  DESCRIPTION OF PROCEDURE:  The patient was brought to the second floor [facility] in the postabsorptive state.  She was premedicated with p.o. Valium, as well as IV contrast allergy prophylaxis. Her right groin was prepped and draped in usual sterile fashion.  Xylocaine 1% was used for local anesthesia.  A 6-French sheath was inserted into the right femoral artery using standard Seldinger technique.  An 8-French sheath was inserted into the right femoral vein.  A 7-French balloon-tip thermal dilution Swan-Ganz catheter was used for obtaining sequential right heart pressures.  A 6-French right and left Judkins diagnostic catheters along with a 6-French pigtail catheter were used for selective coronary angiography, left ventriculography and distal abdominal aortography.  Omnipaque dye was used for the entirety of the case.  Retrograde aorta, ventricular and pullback pressures were recorded.  HEMODYNAMICS: 1. Aortic systolic pressure 134, diastolic pressure 57. 2. Left  ventricular systolic pressure 135, end-diastolic pressure 22. 3. Right atrial pressure:  mean 15. 4. Right ventricular pressure: systolic 55, diastolic pressure 18. 5. Pulmonary artery pressure:  systolic 50, diastolic pressure 23. 6. Pulmonary capillary wedge pressure:  mean 22.  SELECTIVE CORONARY ANGIOGRAPHY: 1. Left main: normal. 2. LAD: normal. 3. Left circumflex:  normal. 4. Right coronary artery: dominant and normal.  LEFT VENTRICULOGRAPHY:  RAO left ventriculograms were performed using 25 cc of Omnipaque dye at 12 cc/second.  The overall LVEF was estimated greater than 60% without focal wall motion abnormalities.  DISTAL ABDOMINAL AORTOGRAPHY:  This was performed using 25 cc of Omnipaque dye at 20 cc/second.  There was approximately 30% proximal right renal artery stenosis.  The infrarenal abdominal aorta and iliac bifurcation free of significant atherosclerotic changes.  IMPRESSION:  Ms. Shingledecker has essentially normal catheterization and normal LV function.  She has moderate pulmonary hypertension.  I believe her T-wave changes are secondary to LVH with repolarization.   Plans will be for medical therapy of her hypertension and COPD.  An ACT was measured and the sheaths were removed.  Pressure was held on the groins to achieve hemostasis.  The patient left the laboratory in stable condition. DD:  06/09/00 TD:  06/09/00 Job: [id number]

## 2010-11-02 NOTE — Consult Note (Signed)
Christina, [name]          ACCOUNT NO.:  [id number]   MEDICAL RECORD NO.:  [id number]          PATIENT TYPE:  INP   LOCATION:  4733                         FACILITY:  [facility]   PHYSICIAN:  No Name Given          DATE OF BIRTH:  13-May-1930   DATE OF CONSULTATION:  12/08/2005  DATE OF DISCHARGE:                                   CONSULTATION   REFERRING PHYSICIAN:  Valetta Mole. Swords, M.D.   REASON FOR CONSULTATION:  [name] is a 78 year old female, with history  of normal coronary arteries by cardiac catheterization in December 2001 by  Dr. Susa Griffins, and with multiple cardiac risk factors including  insulin-requiring diabetes mellitus, now referred for further evaluation of  abnormal cardiac markers and serial EKGs.  Of note, the patient has also had  an adenosine stress Myoview in June 2006 for evaluation of chest pain and  dyspnea, revealing normal perfusion but in the setting of markedly abnormal  EKG baseline with lateral ST segment depression and T wave inversion.  Ejection fraction was calculated at 58%.   The patient presented yesterday with progressive dyspnea, chest pain, and  was found to have markedly elevated blood pressure (216/102 in the ER) and  congestive heart failure on chest x-ray.  Serial cardiac markers are notable  for elevated total CPK but with negative relative index; serial troponin I  reveal a peak of 0.14.   Serial EKG notable for prominent inferior/inferolateral ST segment  depression and T wave inversion.   The patient has responded with significant diuresis on IV Lasix and is  reporting some improvement in the shortness of breath.   The patient gives a history of intermittent chest pain, which sounds  atypical and described as sharp with unpredictable onset and of brief  duration. This happens on a nearly daily basis and is exacerbated by eating.  There is no clear exertional component to the chest discomfort.  Of note,  she  describes this as sharp and a burning sensation in the upper mid  chest, radiation up into the neck.  She also reports associated dyspnea,  diaphoresis, and nausea when this occurs.   Regarding activity, the patient is extremely limited in her mobility; she  walks with the aid of a walker or cane, reports significant exertional  dyspnea with this.  She is unable to climb a flight of stairs and has not  done so for quite some time.   ALLERGIES:  CONTRAST DYE.   HOME MEDICATIONS:  1.  Reglan 10 four times a day.  2.  Crestor 40 nightly.  3.  Nexium 40 daily.  4.  Oxycodone/ APAP 5/325 q. 6 h p.r.n.  5.  Diovan/hydrochlorothiazide 160/12.5 mg 2 tablets daily.  6.  Labetalol 200 mg 4 tablets b.i.d.  7.  Morphine sulfate 30 mg b.i.d.  8.  Toprol XL 25 mg daily.  9.  Metolazone 5 mg daily.  10. Lasix 80 mg b.i.d.  11. Methocarbamol 750 mg 3 times a day.  12. Lexapro 10 mg daily.  13. Advair 100/50 mg 1 tablet b.i.d.  14.  Zetia 10 mg daily.  15. Lantus 85 units b.i.d.   HOSPITAL MEDICATIONS:  1.  Lasix 80 mg b.i.d.  2.  Aspirin 81 mg daily.  3.  Advair.  4.  Lipitor 80 mg daily.  5.  Avapro 300 mg daily.  6.  Protonix 80 mg daily.  7.  Hydrochlorothiazide 12.5 daily.  8.  Hydralazine 25 mg a.c. and h.s.  9.  Zithromax 250 mg daily.  10. Norvasc 5 mg daily.  11. Lantus 15 units daily.  12. NovoLog sliding scale insulin.  13. Lovenox 110 mg q. 12 h.  14. Atrovent inhaler 2 puffs q. 6 h p.r.n.   PAST MEDICAL HISTORY:  1.  Hypertension.  2.  Insulin-requiring diabetes mellitus.  3.  Hyperlipidemia.  4.  Obstructive sleep apnea on CPAP.  5.  Obesity.  6.  Asthma.  7.  History of peptic structure.  8.  Herniated lumbar disks treated conservatively.  9.  Bilateral tubal ligation.  10. Status post craniotomy with bur holes in 1997.   SOCIAL HISTORY:  The patient lives here in [place] with her husband.  She  has never smoked tobacco and denies alcohol use.  She ambulates  with the use  of a walker or cane.   FAMILY HISTORY:  Both parents deceased at late age, neither of whom had  heart disease.  Siblings have no known heart disease.   REVIEW OF SYSTEMS:  Chronic exertional dyspnea, 2-pillow orthopnea,  occasional PND, lower extremity edema.  Weight gain of 10-15 pounds over the  past 2 weeks.  The patient has symptoms suggestive of heartburn and is on  Nexium.  There is no evidence of overt bleeding.  She denies history of a  stroke.  Otherwise as noted.  Remaining systems negative.   PHYSICAL EXAMINATION:  VITAL SIGNS:  Blood pressure currently 155/53  (216/102 initially in the ER), temperature 97.7, pulse 62 and regular,  respirations 18, O2 saturation 99% on 2 liters.  The patient diuresed over 4  liters in the last 24 hours.  GENERAL:  A 77 year old female, sitting upright, in no respiratory distress.  HEENT: Normocephalic and atraumatic.  NECK:  Palpable carotid pulses with prominent right carotid bruit; no  audible bruit on the left; no evident jugular venous distention.  LUNGS: Diminished breath sounds in both bases with some mild crackles on the  left; no wheezes.  HEART:  Regular rate and rhythm (S1, S2), positive S4.  No significant  murmur.  ABDOMEN:  Protuberant, nontender, with intact bowel sounds.  EXTREMITIES: Palpable femoral pulses bilaterally and palpable dorsalis pedis  pulses with no significant pedal edema.  NEUROLOGIC: Alert and oriented x3.   Admission chest x-ray: Cardiomegaly with pulmonary edema; no definite  effusion.   Admission EKG: Sinus bradycardia at 50 beats per minute with normal axis;  inferior/inferolateral ST-segment depression and T wave inversion.   LABORATORY DATA:  Peak CPK/MB 361/6.0 with negative relative index, troponin  I 0.14.  Hemoglobin 12.8, hematocrit 39, WBC 6.5, platelets 253.  Sodium 139, potassium 3.8, BUN 21, creatinine 1.7 (BUN 15 and creatinine 1.1 on  admission), glucose 267.  Liver  enzymes normal.  Hemoglobin A1c 11.2.  TSH  3.8.  BNP 262 on admission.  Recent lipid profile: Total cholesterol 205,  triglycerides 63, HDL 52.   IMPRESSION:  [name] is a 77 year old female, with history of normal  coronary arteries by cardiac catheterization in 2001 and with a normal  pharmacologic perfusion study in June 2006,  who now presents with congestive  heart failure and uncontrolled hypertension and is referred for evaluation  of abnormal cardiac markers and serial EKG tracings.   The patient's complaint of chest pain is atypical for ischemia.   The patient also presents with admitted noncompliance with her medications;  she did report to [place] that she ran out of some of her medications, including  hypertensives, earlier this week but then retracted this statement when  confronted by both her husband and her daughter during our discussion.   PLAN:  We spent a considerable amount of time discussing the importance of  medication compliance in order to maintain adequate blood pressure control.  Moreover, it is unclear from our discussion as to exactly what the patient  is on with respect to antihypertensives.  It was agreed that the husband  would bring in all of her home medications for [place] to review in the morning  so as to be absolutely clear as to what she was taking.  It was also made  clear that finances are a concern, and our recommendation would be to try to  simplify the regimen as much as possible as well as with respect to cost  considerations.   Regarding further ischemic workup, given the atypical nature of her chest  discomfort and in light of her recent normal coronary angiogram and a more  recent normal stress perfusion study which did cite the abnormal baseline  EKG changes, we feel that no further cardiac workup is indicated at this  time. We will, however, schedule carotid Dopplers to be performed in house  for further evaluation of a noted right carotid  bruit.   Further recommendations will be made pending review of her home medication  regimen including the recent addition of 2 new prescription medications.      Gene Serpe, P.A. [facility]    ______________________________  No Name Given    GS/MEDQ  D:  12/08/2005  T:  12/08/2005  Job:  [id number]

## 2010-11-02 NOTE — Cardiovascular Report (Signed)
NAMESABREA, SANKEY          ACCOUNT NO.:  [id number]   MEDICAL RECORD NO.:  [id number]          PATIENT TYPE:  INP   LOCATION:  2010                         FACILITY:  [facility]   PHYSICIAN:  Everardo Beals. Juanda Chance, MD, FACCDATE OF BIRTH:  1930-01-29   DATE OF PROCEDURE:  DATE OF DISCHARGE:                            CARDIAC CATHETERIZATION   DICTATION ENDED HERE.      Bruce Elvera Lennox Juanda Chance, MD, [facility]  Electronically Signed     BRB/MEDQ  D:  06/11/2006  T:  06/12/2006  Job:  [id number]

## 2010-11-02 NOTE — H&P (Signed)
[name], [name]          ACCOUNT NO.:  [id number]   MEDICAL RECORD NO.:  [id number]          PATIENT TYPE:  INP   LOCATION:  4733                         FACILITY:  [facility]   PHYSICIAN:  Rod Holler, MD      DATE OF BIRTH:  1930/06/13   DATE OF ADMISSION:  12/07/2005  DATE OF DISCHARGE:                                HISTORY & PHYSICAL   CHIEF COMPLAINT:  Shortness of breath.   HISTORY OF PRESENT ILLNESS:  [name] is a 77 year old female who  presented to the emergency department with complaints of shortness of  breath.  The patient has a 3- to 4-day history of progressively worsening  shortness of breath at baseline, along with dyspnea on exertion.  She is  only able to walk less than block before she is short of breath.  She also  has complaints of orthopnea, along with PND tonight.  As above, this  shortness of breath is progressively worsening over the last 3-4 days.  She  also has worsening lower extremity edema.  She also has complaints of  intermittent chest discomfort over the past week.  She also complains of a  nonproductive cough.  She did feel feverish yesterday, although she did not  take her temperature.  Upon arrival to the emergency department, the patient  was noted to have a systolic blood pressure of 216.  The patient was given  IV nitroglycerin, along with IV Lasix, along with being placed on  noninvasive ventilation.  The patient diuresed well, and now is feeling much  improved.   PAST MEDICAL HISTORY:  1.  Hypertension.  2.  Obstructive sleep apnea, on CPAP.  3.  History of MI and CHF according to the chart.  4.  Diabetes mellitus.  5.  Asthma.  6.  Hyperlipidemia.  7.  Bilateral tubal ligation.   MEDICINES:  The patient does not have a list of her medicines.   ALLERGIES:  IV DYE.   SOCIAL HISTORY:  The patient does not smoke, is marries and lives in  [place].   FAMILY HISTORY:  Father with history of coronary artery disease, not  premature.   REVIEW OF SYSTEMS:  All systems are reviewed in detail and are negative,  except as noted in the history of present illness.   PHYSICAL EXAMINATION:  VITAL SIGNS:  Blood pressure currently 130s over 70s,  systolic blood pressure of 216 upon arrival to the emergency department,  heart rate of 57, respiratory rate of 20, and 28 upon arrival to the  emergency department, oxygen saturation 96% on 2 L by nasal cannula.  GENERAL:  Obese female, alert and oriented x3, in no apparent distress.  HEENT:  Atraumatic, normocephalic.  Pupils are equal, round and reactive to  light.  Extraocular movements intact.  Oropharynx clear.  NECK:  Supple with no adenopathy, no carotid bruits, thick neck, unable to  evaluate for JVD.  CHEST:  Bibasilar crackles, one-third of the way up the lung fields  bilaterally, equal bilateral breath sounds.  CORONARY:  Regular rhythm, normal rate, normal S1 and S2, no murmurs, rubs,  or gallops.  ABDOMEN:  Soft, nontender and non-distended, active bowel sounds, without  hepatosplenomegaly.  EXTREMITIES:  Bilateral 1+ lower extremity edema, no clubbing or cyanosis.  NEUROLOGIC:  No focal deficits.  SKIN:  No rashes.  PSYCHIATRIC:  Normal affect.   LABORATORY DATA:  Sodium 139, potassium 4, chloride 106, bicarb 24, BUN 15,  creatinine 1.1, glucose 310.  Myoglobin 222, CK-MB of 5.3, troponin less  than 0.05.  BNP of 262.  White blood cell count 10.6, hematocrit 41.7,  platelet count 259,000.   EKG shows a normal sinus rhythm with poor R wave progression, inferolateral  ST-T wave changes.   Chest x-ray shows pulmonary edema versus infiltrate.   IMPRESSION:  [name] is a 77 year old female who presented to the  emergency department with poorly controlled hypertension along with most  likely pulmonary edema secondary to this hypertension.  In the emergency  department, the patient was diuresed, and had control of her blood pressure.  The patient was  also started on Avelox in the emergency department secondary  to a temperature of 100.9 upon arrival, along with her complaints of  shortness of breath.   PLAN:  1.  Cardiovascular:  We will rule the patient out with serial cardiac      enzymes, get thyroid function tests, place the patient on Crestor and      aspirin, continue to diurese with intravenous Lasix.  An old medicine      list has Diovan/hydrochlorothiazide, along with hydralazine for her      antihypertensives.  We will place the patient on these medicines, and      also place the patient on Norvasc at 5 mg p.o. daily.  We will get a      daily EKG for 3 days, a BNP in the morning.  Strict ins and outs, daily      weights and admit to a telemetry bed.  2.  Pulmonary:  Continue the patient on Atrovent and Advair, continuous      positive airway pressure at night.  3.  Infectious disease:  The patient received a dose of Avelox in the      emergency department; I will place the patient on azithromycin for 5      days.  4.  Fluids, electrolytes and nutrition:  Diabetic diet.  5.  Endocrine:  Thyroid function tests, sliding-scale insulin.      Rod Holler, MD  Electronically Signed     TRK/MEDQ  D:  12/07/2005  T:  12/07/2005  Job:  [id number]

## 2010-11-02 NOTE — Discharge Summary (Signed)
   [name], [name]                    ACCOUNT NO.:  [id number]   MEDICAL RECORD NO.:  [id number]                   PATIENT TYPE:  INP   LOCATION:  5151                                 FACILITY:  [facility]   PHYSICIAN:  Rene Paci, M.D. [facility]          DATE OF BIRTH:  03/25/1930   DATE OF ADMISSION:  07/19/2002  DATE OF DISCHARGE:  07/24/2002                                 DISCHARGE SUMMARY   DISCHARGE DIAGNOSES:  1. Intractable right hip and back pain.  2. Right trochanteric bursitis.  3. Chronic renal insufficiency.   BRIEF ADMISSION HISTORY:  The patient is a 77 year old white female who  presents with complaints of right hip pain.  Symptoms have gradually  worsened over the past six to eight weeks.   PAST MEDICAL HISTORY:  1. Adult-onset diabetes mellitus.  2. Hypertension.  3. Nonobstructive coronary disease by catheterization December 2001.  4. BTL.  5. OSA, using CPAP.  6. History of brain surgery in 1997 secondary to a blood clot.   HOSPITAL COURSE:  Right hip pain:  The patient had intractable right hip and  back pain.  Plain films and MRI were ordered.  MRI revealed L4-5 disk bulge  and significant degenerative facet joint at L2-3, L4-5, and L5-S1.  X-ray  revealed mild sclerosis and some mild spondylolisthesis.  It was felt that  despite the radiology findings that her pain was coming from her hip and  most consistent with right trochanteric bursitis.  The patient did have that  bursa injected with Xylocaine.  The patient's pain significantly improved  after this.  The patient was able to ambulate and was eventually discharged  home on July 24, 2002.   DISCHARGE LABORATORY DATA:  CBC was normal.  Coagulations were normal.  D-  dimer was normal.  BUN was 31, creatinine 1.9.   MEDICATIONS AT DISCHARGE:  1. Vioxx 25 mg daily.  2. Vicodin 10/650 q.6h. p.r.n.  3. Cardura 8 mg q.h.s.  4. Diovan HCT 160/12.5 b.i.d.  5. Apresoline 10 mg q.i.d.  6. MS  Contin 30 mg b.i.d.  7. Zaroxolyn 5 mg daily.  8. Lasix 20 mg b.i.d.  9. Normodyne 200 mg b.i.d.  10.      Lipitor 20 mg daily.  11.      Lexapro 10 mg daily.  12.     The patient was also discharged on NovoLog 20 units in the morning, 26 units     at noon, 30 units at dinner, and Lantus 20 units at bedtime.   FOLLOW-UP:  The patient was to follow up with Dr. Tawanna Cooler in one to two weeks.     Cornell Barman, P.A. [facility]                  Rene Paci, M.D. [facility]    LC/MEDQ  D:  08/25/2002  T:  08/26/2002  Job:  [id number]

## 2010-11-02 NOTE — H&P (Signed)
[name], [name]          ACCOUNT NO.:  [id number]   MEDICAL RECORD NO.:  [id number]          PATIENT TYPE:  INP   LOCATION:  2010                         FACILITY:  [facility]   PHYSICIAN:  Everardo Beals. Juanda Chance, MD, FACCDATE OF BIRTH:  [birth year]   DATE OF ADMISSION:  06/11/2006  DATE OF DISCHARGE:                              HISTORY & PHYSICAL   REFERRING PHYSICIAN:  Dr. Romero Belling   CARDIOLOGIST:  Dr. Juanito Doom   REASON FOR CONSULTATION:  Evaluation of chest pain.   CLINICAL HISTORY:  [name] is 77 years old and was admitted by Dr.  Everardo All today through his office for symptoms of chest pain.  She has  had chest pain off and on for the past two weeks and it became worse  today and more persistent today.  She also has had some symptoms of  shortness of breath and some slight swelling.   She has a past history of congestive heart failure related to diastolic  dysfunction and hypertension.  She has a history of normal coronary  angiography in 2001.   When she arrived in the hospital today, she was still having 5 to 6 out  of 10 chest pain.  This improved slightly on its own and then with IV  nitroglycerin and 3 mg of morphine improved significantly more.   PAST MEDICAL HISTORY:  Significant for:  1. Insulin-dependent diabetes.  2. Hypertension.  3. Hyperlipidemia.  4. Obstructive sleep apnea.  5. SHE ALSO HAS A HISTORY OF A DYE ALLERGY WITH HER LAST CATH AT WHICH      TIME SHE DEVELOPED A RASH IN 2001.  6. She also has a history of craniotomy for a blood clot.   HOME MEDICATIONS:  Include:  1. Isosorbide 30 mg daily.  2. Lasix 80 mg b.i.d.  3. Hydralazine 25 mg t.i.d.  4. Labetalol 400 mg b.i.d.  5. Klor-Con 20 mEq daily.  6. Promethazine 25 mg daily.  7. Crestor 40 mg daily.  8. Avapro 300 mg daily.  9. Advair Diskus two puffs b.i.d.  10.Humalog insulin 75/25 b.i.d.   SOCIAL HISTORY:  She is married and lives with her husband and has  several grown  children.  She does not smoke.   FAMILY HISTORY:  Her father had a history of a pacemaker and she is not  sure about her mother's family history of coronary heart disease.   REVIEW OF SYSTEMS:  Positive for symptoms of edema and shortness of  breath.   PHYSICAL EXAMINATION:  VITAL SIGNS:  Blood pressure was 100/70 and the  pulse 48 and regular.  There was no venous distention.  The carotid  pulses were full and I could hear no bruits.  CHEST:  Clear without rales or rhonchi.  CARDIOVASCULAR:  The cardiac rhythm was regular.  The first and second  heart sounds were normal and there were no murmurs or gallops.  ABDOMEN:  Protuberant.  The bowel sounds were normal and the abdomen was  soft.  There was no hepatosplenomegaly.  EXTREMITIES:  I had somewhat difficulty feeling the pedal pulses.  There  was trace peripheral  edema.  MUSCULOSKELETAL SYSTEM:  Showed no deformities.  SKIN:  Warm and dry.  NEUROLOGICAL EXAMINATION:  Showed no focal neurological signs.   Her electrocardiogram showed T-wave inversions in V2 through V4 with Q  waves in II, II and aVF and about 0.5 mm ST elevation with upright T  waves in inferior leads.  Compared with the last ECG from last June that  we have, the Q waves in the inferior leads are new and the T waves in  the inferior leads are now upright compared to being inverted before.   IMPRESSION:  1. Prolonged chest pain suggestive of ischemia, rule out myocardial      infarction.  2. History of congestive heart failure secondary to diastolic      dysfunction and hypertension.  3. History of normal coronary angiography in 2001.  4. Insulin-dependent diabetes.  5. Hypertension.  6. Hyperlipidemia.  7. Obstructive sleep apnea.  8. HISTORY OF DYE ALLERGY WITH RASH FOLLOWING CATHETERIZATION IN 2001.  9. History of craniotomy for blood clot.   RECOMMENDATIONS:  Dr. Everardo All has written admission orders.  Will add  nitroglycerin at 5 mcg per minute and will  start her on IV heparin per  pharmacy.  Will also give her morphine.  Will plan cardiac  catheterization tomorrow or sooner if she becomes unstable.  She will  need to be premedicated for her dye allergy so I prefer to do the  procedure electively rather than emergently if possible.      Bruce Elvera Lennox Juanda Chance, MD, [facility]  Electronically Signed    BRB/MEDQ  D:  06/11/2006  T:  06/12/2006  Job:  [id number]   cc:   Gregary Signs A. Everardo All, MD  Jesse Sans Wall, MD, [facility]

## 2010-11-02 NOTE — Discharge Summary (Signed)
NAMEANNISHA, BAAR NO.:  [id number]   MEDICAL RECORD NO.:  [id number]          PATIENT TYPE:  INP   LOCATION:  5003                         FACILITY:  [facility]   PHYSICIAN:  Willow Ora, MD           DATE OF BIRTH:  1929-10-20   DATE OF ADMISSION:  06/11/2006  DATE OF DISCHARGE:  06/17/2006                               DISCHARGE SUMMARY   BRIEF HISTORY AND PHYSICAL:  Mrs. Rosano is a 77 year old female, who  was admitted with a several-hour history of shortness of breath that was  associated with intermittent chest pain radiating to the right upper  extremity.  This was possibly worsened in the context of movement of her  right upper extremity.  She has multiple cardiovascular risk factors and  was admitted for further care.   ADMISSION PHYSICAL EXAMINATION:  VITAL SIGNS:  Blood pressure 117/54,  heart rate 58, afebrile, weight 247 pounds.  CHEST:  Clear to auscultation.  CARDIOVASCULAR:  No JVD.  There is trace bilateral pretibial edema.  Regular rate and rhythm without murmur.  ABDOMEN:  Soft and nontender.   LABORATORY DATA:  EKG showed sinus bradycardia with some T-wave  abnormalities.  Chest x-ray showed no acute disease.  V/Q scan was  essentially a normal study.  Initial white count was 7.5 with a  hemoglobin of 12.5 and platelets of 273.  Potassium 3.8, glucose 146,  creatinine 1.2, calcium 9.4, cholesterol 177 with HDL of 60 and LDL of  104.  D-dimer was 1.83, which is positive.  BNP was 93.  TSH was 1.0.  A1c was 9.6.  Creatinine prior to discharge was 1.6 with a potassium of  4.1.   HOSPITAL COURSE:  The patient was admitted to the hospital with chest  discomfort and shortness of breath.  Cardiology was consulted.  They  decided to do a cardiac catheterization in this woman with multiple  cardiovascular risk factors.  It showed nonobstructive coronary artery  disease with 30% narrowing of the mid LAD, 40% narrowing of the mid and  distal  circumflex, and 30% of the proximal right coronary artery with  normal left ventricular function with an EF of 60%.  She had some  elevated pulmonary artery wedge pressure.  After the cardiac  catheterization, the patient continued to complain of chest pain.  D-  dimer was positive and subsequently she had a V/Q scan, which was  negative.  During this hospital admission too, we noticed that she had  labile diabetes with occasional hypoglycemia.  Her insulin 70/30 was  decreased a little bit from 80 to 70 units plus a sliding scale.  She  also had what seems to be an intercurrent sinus infection with  headaches.  She was prescribed amoxicillin.  We noticed an increase of  her creatinine from 1.2 to 1.6.  This may have been related with the  cardiac catheterization dye.  At this point, however, the creatinine is  stable at 1.6.  This ought to be followed up as an outpatient.  She was  also counseled in regard to her diabetes.  This  is poorly controlled.  Nutrition assistance was provided to the patient and her husband and  they seem to have a good understanding of that.  She was also seen by  cardiac rehab and she was able to ambulate without many problems.  At  the time of discharge, she was up and around and ready to go.  I think  she got maximum hospital benefit.   DISCHARGE INSTRUCTIONS:  At this point, she will be discharged with the  following instructions.  1. Isosorbide mononitrate 30 mg 1 p.o. daily.  2. While in the hospital, she was on Lipitor 80.  Her regular      medication, however, is Crestor 40.  I noticed that her cholesterol      is not at goal.  I am planning to discharge her on her routine      medicine, which is Crestor 40, understanding that this will have to      be adjusted by her primary care doctor.  3. Avapro 300 mg 1 p.o. daily.  4. Potassium 20 one p.o. daily.  5. Advair 100/50 twice a day.  6. Labetalol 400 b.i.d.  7. Either Protonix or Prilosec over the  counter.  8. Aspirin 325 mg 1 a day.  9. Norvasc 10 mg 1 a day, which I understand is a new medication.  I      wrote a prescription for #30 pills and no refills.  10.Apresoline 25 mg 1 p.o. t.i.d.  11.She will continue with amoxicillin 500 mg 2 tablets twice a day for      7 days for presumed sinusitis.  12.Lasix 80 mg twice a day.  13.Insulin.  I would like the patient to continue her previous      regimen.  She is aware that her A1c is not at goal and that it      needs to be discussed with her primary care doctor.  14.I asked her to see Dr. Everardo All within 1 week or sooner if her      sugars are more than 300.   ADMISSION DIAGNOSIS:  Chest pain and shortness of breath.   DISCHARGE DIAGNOSES:  1. Chest pain and shortness of breath, status post cardiac      catheterization, which showed nonobstructive coronary artery      disease and also a V/Q scan that was basically negative.  2. Diabetes.  3. Hypertension.  4. Renal insufficiency with creatinine worsening from 1.2 to 1.6, but      at this time is stable.  5. High cholesterol.  6. History of sleep apnea.  7. History of osteoarthritis.  8. History of gastroesophageal reflux disease.  9. History of chronic obstructive pulmonary disease and asthma.      Willow Ora, MD  Electronically Signed     JP/MEDQ  D:  06/17/2006  T:  06/17/2006  Job:  [id number]

## 2010-11-02 NOTE — Assessment & Plan Note (Signed)
[facility]                          CHRONIC HEART FAILURE NOTE   [name], [name]                 MRN:          [id number]  DATE:08/04/2006                            DOB:          Apr 04, 1930    Ms. Schall returns today for followup concerning her congestive heart  failure which is diastolic in nature.  She has a history of poorly  controlled hypertension, also has a history of atypical chest pain  status post cardiac catheterization in 2001 showing normal coronaries.  Status post cardiac catheterization also in December 2007 showed non-  obstructive disease, with a normal EF and mildly elevated pulmonary  artery wedge pressures.  Her primary cardiologist is Dr. Daleen Squibb whom she  recently saw earlier this month at which time he increased her  hydralazine from 25 mg t.i.d. to 50 t.i.d. and decreased her Norvasc to  5 mg.  Ms. Molter is telling me she did not make the change in her  medication.  She continues to take the hydralazine at 25 mg t.i.d. and  initially told me she previously was taking 30 mg of Norvasc and cut the  dose to 15.  However, upon calling her pharmacy it is clear that she was  only taking 10 mg and decreased the dose to 5 mg.  Today, she is also  complaining of increased swelling in her lower extremities, with no  improvement with the decreased dose of Norvasc.  Ms. Sievers states  however she has a history of edema with Norvasc which recently had been  restarted during hospitalization at the end of 2007.  Otherwise, she  denies orthopnea or PND, no chest discomfort or lightheadedness or  dizziness.   PAST MEDICAL HISTORY:  Past medical history as stated above also  includes obstructive sleep apnea with C-PAP use, diabetes which is  managed by Dr. Everardo All, GERD, depression, morbid obesity,  hyperlipidemia, diabetic peripheral neuropathy, remote history of a  craniotomy secondary to blood clot, history of vertigo and  dizziness  status post Doppler study showing 0-39% bilateral ICA stenosis and an  IVP DYE ALLERGY.  Also a history of asthma, followed by Dr. Shelle Iron.   CURRENT MEDICATIONS:  1. Klor-Con 20 mEq daily.  2. Crestor 40.  3. Avapro 300.  4. Lasix 80 b.i.d.  5. Isosorbide 30.  6. Labetalol 400 mg b.i.d.  7. Hydralazine, should be 50 mg t.i.d.  The patient is still taking      the 25 mg t.i.d.  8. Advair 2 puffs b.i.d.  9. Insulin.  10.Aspirin 325.  11.Norvasc 5 mg daily.   ALLERGIES:  IVP DYE, intolerance to [facility] which causes edema.   PHYSICAL EXAMINATION:  The patient's blood pressure today is 192/80.  Note: The patient has not taken her blood pressure medications yet  today.  Heart rate is 66.  Weight 245 pounds.  Ms. Pederson is in no  acute distress.  No jugular venous distention at 45 degree angles.  LUNGS:  Clear to auscultation.  CARDIOVASCULAR:  S1 and S2.  No S3.  ABDOMEN:  Soft, nontender, positive bowel sounds, protuberant abdomen.  LOWER EXTREMITIES:  2+ pitting edema on the left and 1+ on the right,  positive pedal pulses.   IMPRESSION:  Diastolic heart failure, the patient hypertensive today.  She states she has not taken her medications yet.  Recent adjustment in  Norvasc dose and hydralazine by Dr. Daleen Squibb, with patient not increasing  hydralazine dose and no change in peripheral edema with decreased  Norvasc dose.  We will check a BNP and a BMET on patient today.  I  suspect that we are going to have to stop the Norvasc as it seems to be  contributing to her peripheral edema.  However she will need improved  blood pressure control.  Hopefully we can get her to increase the  hydralazine to 50 t.i.d. as Dr. Daleen Squibb had recommended.  I will call her  with her lab results this afternoon or tomorrow and make adjustments in  medications accordingly.      Dorian Pod, ACNP  Electronically Signed      Rollene Rotunda, MD, [facility]  Electronically Signed   MB/MedQ   DD: 08/04/2006  DT: 08/04/2006  Job #: [id number]   cc:   Duke Salvia. Eliott Nine, M.D.  Barbaraann Share, MD,FCCP  Sean A. Everardo All, MD  Jesse Sans Wall, MD, [facility]

## 2010-11-02 NOTE — Assessment & Plan Note (Signed)
[name]                          CHRONIC HEART FAILURE NOTE   [name], [name]                 MRN:          [id number]  DATE:05/23/2006                            DOB:          [birth year]    Primary cardiologist is Dr. Juanito Doom.  Primary care is Dr. Everardo All.  Ms.  [name] is new to the [facility].  She is a 77 year old  African American female with history of a nonischemic  cardiomyopathy/congestive heart failure secondary to poorly controlled  hypertension.  She underwent a cardiac catheterization in 2001 that  showed normal coronaries.  Last stress test in June of 2006, results of  which are not available at this time, but the patient reports she was  told that it was okay.  Ejection fraction of 58% by cardiac  catheterization in the past.  [name] states she was sent here by  Dr. Everardo All for management of her heart failure.  She apparently saw him  on the 3rd of this month for a routine visit.  It was noted at that time  her blood pressure was elevated at 190/70.  [name] was  hospitalized in June of this year secondary to acute heart failure  exacerbation/pulmonary edema due to uncontrolled hypertension.  We  consulted on [name] during that hospitalization.  It was noted  that [name] experienced some atypical chest discomfort at that  time.  However, in light of her normal coronary angiogram and normal  stress perfusion study it was not felt that further cardiac workup was  indicated at that time.  [name] states she has done well since she  was discharged home in June.  She denies any episodes of orthopnea or  PND, presyncope or syncopal episodes.  She does not check her blood  pressure on a routine basis at home.  She does complain of some chest  discomfort and shortness of breath that is relieved with her nebulizer  treatments.   PAST MEDICAL HISTORY:  1. Congestive heart failure secondary  to nonischemic cardiomyopathy      that was likely the result of poor hypertension control.  2. Atypical chest pain, status post cardiac catheterization in 2001      showing normal coronaries, recent stress test that reportedly was      negative for ischemia.  3. GERD.  4. Diabetes.  5. Depression.  6. Poorly controlled hypertension.  7. Obesity.  8. Hyperlipidemia.  9. Diabetic peripheral neuropathy.  10.Osteoarthritis.  11.Sleep apnea.  12.Status post craniotomy for blood clot.  13.Hiatal hernia.  14.Duodenal ulcer.  15.Gastritis.  16.History of vertigo and dizziness, status post Doppler study showing      0% to 39% bilateral ICA stenosis.  17.IVP DYE ALLERGY.  18.History of asthma.   ALLERGIES:  IVP DYE and NORVASC.   CURRENT MEDICATIONS:  1. Klor-Con 20 mEq daily.  2. Crestor 40.  3. Avapro 300.  4. Lasix 80 b.i.d.  5. Isosorbide 30.  6. Celebrex 200 mg.  7. Labetalol 200 mg two tablets b.i.d.  8. Hydralazine 25 mg t.i.d.  9. Advair 2 puffs t.i.d.  10.Humulin insulin.   PHYSICAL EXAMINATION:  Weight 246 pounds, blood pressure 160/68 with a  pulse of 69.  [name] is in no acute distress.  No jugular vein distention noted.  LUNGS:  Clear to auscultation bilaterally.  No wheezing noted.  CARDIOVASCULAR:  S1, S2, regular rate and rhythm.  ABDOMEN:  Positive bowel sounds, soft, nontender.  EXTREMITIES:  Without clubbing, cyanosis or edema.   IMPRESSION:  1. Stable diastolic congestive heart failure, nonischemic, with an      ejection fraction of 58%.  2. Hypertension.   PLAN:  Continue current medications.  We will check BMET and a BNP  today.  I have initiated heart failure education with the patient  including diet, medication activity, signs and symptoms, when to call  me.  I have also given her an information packet to take home. I would  like her to weigh on a daily basis and call me if her weight is up 3  pounds within 3 days.  She will have a repeat  echocardiogram after I see  her at the next visit, and then I will review her medications.  I feel  that we will need additional therapy to keep blood pressure under  control.  However, I would like to repeat her echocardiogram first.      Dorian Pod, ACNP  Electronically Signed      Bevelyn Buckles. Bensimhon, MD  Electronically Signed   MB/MedQ  DD: 05/23/2006  DT: 05/24/2006  Job #: [id number]

## 2010-11-02 NOTE — Discharge Summary (Signed)
[place]. [facility]  Patient:    [name]                  MRN: [id number] Adm. Date:  [id number] Disc. Date: 05/02/99 Attending:  Ruta Hinds Dictator:   Mancel Bale, P.A. CC:         Richard A. Alanda Amass, M.D.             Nolon Nations, M.D.                           Discharge Summary  ADMISSION DIAGNOSES: 1. Unstable angina. 2. Hypertension. 3. Hyperlipidemia. 4. Insulin-dependent diabetes mellitus. 5. Insomnia. 6. Status post knee arthroscopy surgery three months ago.  DISCHARGE DIAGNOSES: 1. Unstable angina. 2. Hypertension. 3. Hyperlipidemia. 4. Insulin-dependent diabetes mellitus. 5. Insomnia. 6. Status post knee arthroscopy surgery three months ago. 7. Status post cardiac catheterization by Dr. Susa Griffins on 05/01/99,    revealing essentially patent coronary arteries with ejection fraction 60%, and    also revealing 40% right renal artery stenosis.  HISTORY OF PRESENT ILLNESS:  [name] is a 77 year old female who presented on 04/30/99 with complaints of chest pain radiating to her right side, sometimes worse with movement.  She had no nausea, vomiting.  She did have some shortness of breath associated with the chest pain.  PHYSICAL EXAMINATION:  VITAL SIGNS:  Heart rate 68, blood pressure 214/94.  Her examination was essentially benign with no JVD.  HEART:  Regular rhythm without murmur.  ABDOMEN:  No bruits on exam.  EXTREMITIES:  She had trace peripheral edema to the pretibial area.  She had 1+  dorsalis pedis pulse.  LABORATORY DATA:  Chest x-ray revealed cardiomegaly and increased vascular markings.  Her CK was 405.  Creatinine was okay at 0.8 for possible catheterization.  At that time [name] was planned for admission, to repeat cardiac enzymes, as the initial set was somewhat elevated.  She was started on intravenous heparin s well as intravenous nitroglycerin.  She  would also have a repeat EKG in the morning and would discuss this situation with Dr. Alanda Amass in the morning for possible  catheterization.  HOSPITAL COURSE:  On the morning of 05/01/99, [name] blood pressure was still elevated at 180/90.  Her CK had been elevated x 2, though her MB was within normal limits, as well as a her troponin had been negative.  She was planned for cardiac catheterization for definitive evaluation.  On 05/01/99, [name] underwent cardiac catheterization by Dr. Susa Griffins.  Please see his dictated cardiac catheterization report for further detail.  This revealed essentially normal coronary arteries.  As well, she had normal left ventricular function with ejection fraction 60%.  However, peripheral angiography did reveal a 40 to 50% right renal artery eccentric narrowing with n approximate 10 to 15 mm marker gradient (there was no significant change from her catheterization in 1995).  At that time, Dr. Alanda Amass felt that weight loss is essential, as well as good  diabetic control, and follow up renal duplexes.  On the morning of 05/02/99, [name] is without complaints.  She is afebrile and has a blood pressure of 120 to 145 systolic, and 55 to 60 diastolic.  Her heart rate has been in the 50s to 60s.  Her lungs are clear to auscultation.  Heart is regular rhythm without murmur, rub, or gallop, and normal S1 and S2.  She has no peripheral bruits.  Her right groin is stable without bruits or hematoma, and she has a 2+ dorsalis pedis pulse.  She is felt to be stable for discharge home at his time.  Altace 5 mg is being added for further blood pressure control.  Also of note, her blood sugar was somewhat low this morning.  However, looking back through the chart, the blood sugars have been within normal range on the other mornings, so we will not have her adjust insulin dosing at this time, but follow up with Dr.  Rennis Harding.  CONSULTATIONS:  None.  PROCEDURES:  Cardiac catheterization by Dr. Susa Griffins on 05/01/99, revealing essentially normal coronary arteries with normal left ventricular function with ejection fraction 60%, and 40 to 50% right renal artery stenosis.  LABORATORY DATA:  Lipase on admission was normal at 36.  INR was 0.9. Metabolic profile was essentially normal with sodium of 137, potassium 3.7, chloride 104, CO2 28, glucose 171, BUN 15, creatinine 0.8.  CBC was also normal with white blood ell count of 7.6, hemoglobin 14.3, hematocrit 43.6, and platelets 291.  CK was 405,  CK-MB was 12.0.  Repeat CK was 293, CK-MB was 7.5.  EKG showed normal sinus rhythm at 60 beats per minute with T-wave inversions in  leads I, II, III aVF, as well as leads V-IV through V-VI.  Initial chest x-ray had revealed cardiomegaly and increased vascular markings.  DISCHARGE MEDICATIONS: 1. Altace 5 mg once a day. 2. Prilosec 20 mg once a day. 3. Norvasc 10 mg once a day. 4. Lasix 80 mg once a day. 5. Enteric-coated aspirin 325 mg once a day. 6. Actos 15 mg once a day. 7. Humulog insulin 30 units q.a.m., 30 units at lunch, and 50 units q.p.m. 8. Glucophage:  Do not take for three days, then resume previous dose of 500 mg  twice a day.  ACTIVITY:  No strenuous activity, lifting greater than 5 pounds, driving, or sexual activity for three days.  DIET:  Low salt, low fat, diabetic diet.  WOUND CARE:  May gently wash the groin with warm water and soap.  SPECIAL INSTRUCTIONS:  Call the office at [phone] with any bleeding, increased  size or pain of the groin.  FOLLOWUP:  The office is to call the patient with an appointment for renal Doppler appointment, as well as appointment with Dr. Susa Griffins.  She is to call  Dr. Tula Nakayama office today to make an appointment to see him next week for diabetic management.  She states that she already has an appointment for next  week followup. DD:  05/02/99 TD:  05/02/99 Job: [id number]

## 2010-11-02 NOTE — Assessment & Plan Note (Signed)
[facility]                          CHRONIC HEART FAILURE NOTE   [name]                 MRN:          [id number]  DATE:06/23/2006                            DOB:          10-29-1929    PRIMARY CARDIOLOGIST:  Dr. Juanito Doom.  Renal is Dr. Eliott Nine and pulmonary  is Dr. Shelle Iron.   [name] returns today for followup.  I initially saw her in the  heart failure clinic December 7 secondary to her congestive heart  failure with a history of nonischemic cardiomyopathy in the setting of  poorly-controlled hypertension.  The patient's heart failure was stable  at that time.  I initiated her education with plans to see her back in 4  weeks.  However, since that time, the patient has been admitted to [facility] secondary to chest discomfort and shortness of breath.  She was admitted on June 11, 2006 and discharged home on June 17, 2006.  During that time, cardiology was asked to consult.  The patient  underwent a cardiac catheterization that showed nonobstructive disease  with 30% narrowing of the mid LAD, 40% narrowing of the mid and distal  circumflex, and 30% of the proximal right coronary artery with normal  ejection fraction of 60%.  The patient did have some elevated pulmonary  artery wedge pressure.  [name] continued to have episodes of chest  discomfort and underwent a VQ scan, which was negative for pulmonary  embolism.  She also was treated for intercurrent sinus infection with  headaches.  She was also treated for labile diabetes.  She was  discharged home in stable condition.  She has a followup appointment  scheduled with Dr. Everardo All tomorrow, and a followup appointment with Dr.  Daleen Squibb February 4.  [name] states she has been doing okay since she  was discharged home.  She still complains of dyspnea on exertion, but  otherwise states she feels better.   PAST MEDICAL HISTORY:  1. Includes congestive  heart failure secondary to nonischemic      cardiomyopathy in the setting of poorly-controlled hypertension.  2. Atypical chest pain status post cardiac cath in 2001 showing normal      coronaries.  Status post cardiac cath December 2007 showing mild      nonobstructive disease with a normal EF and mildly elevated      pulmonary artery wedge pressure.  Also status post recent stress      test that reportedly was negative for ischemia.  3. Obstructive sleep apnea with CPAP use nightly.  4. Labile diabetes managed by Dr. Everardo All.  5. GERD.  6. Depression.  7. Poorly-controlled hypertension.  8. Morbid obesity.  9. Hyperlipidemia.  10.Diabetic peripheral neuropathy.  11.Osteoarthritis.  12.Remote history of craniotomy secondary to blood clot.  13.Hiatal hernia.  14.Duodenal ulcers/gastritis.  15.History of vertigo and dizziness status post Doppler study showing      0-39% bilateral ICA stenosis.  16.IVP DYE allergy.  17.History of asthma.  Followed by Dr. Shelle Iron.   CURRENT MEDICATIONS:  1. Isosorbide 30 mg daily.  2. Crestor 40.  3. Avapro 300.  4. Potassium 20 mEq daily.  5. Labetalol 400 mg b.i.d.  6. Advair 100/50 mg b.i.d.  7. Protonix daily.  8. Aspirin 325.  9. Hydralazine 25 mg t.i.d.  10.Lasix 80 mg b.i.d.  11.Insulin per Dr. Everardo All.   ALLERGIES:  Include IVP DYE.  Supposedly NORVASC, which causes edema.  Interestingly enough, the patient was started on Norvasc 10 mg during  recent hospitalization.   PHYSICAL EXAM:  Weight today is at 245 pounds.  Weight is stable from  December weight.  Blood pressure initially recorded at 174/78 by  Dinamap.  Manual blood pressure 150/70 with a pulse of 80.  [name] is in no acute distress.  JVD at 8 cm at a 45 degree angle.  LUNGS:  Clear to auscultation.  CARDIOVASCULAR:  S1, S2.  No murmurs, rubs, or gallops noted.  ABDOMEN:  Soft.  Obese.  LOWER EXTREMITIES:  The patient has +2 pitting edema to the lower  leg  and ankle area.   IMPRESSION:  [name] currently has stable diastolic congestive  heart failure status post recent cardiac catheterization showing mild  nonobstructive disease with a normal ejection fraction.  However, she  does have a moderate amount of peripheral edema.  She states, in the  past, when she had been placed on Norvasc she had a lot of peripheral  edema.  Apparently, she was started no Norvasc again during recent  hospitalization.  I am going to continue the medication for now.  I am  going to treat her with Zaroxolyn 2.5 mg for 3 days.  The patient denies  any increased shortness of breath.  However, if she continues to  complain of increased peripheral edema, we will have to discontinue the  Norvasc and opt for better blood pressure control with optimization of  her other medications.  She does not recall trying other calcium  channel blockers and I will discuss further with Dr. Daleen Squibb, his  preference regarding her blood pressure medications.  The patient also  has a scheduled appointment with him February 4.      Dorian Pod, ACNP  Electronically Signed      Rollene Rotunda, MD, [facility]  Electronically Signed   MB/MedQ  DD: 06/23/2006  DT: 06/23/2006  Job #: [id number]   cc:   D[facility] Salvia. Eliott Nine, M.D.  Sean A. Everardo All, MD
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
## 2010-11-02 NOTE — Assessment & Plan Note (Signed)
[facility]                            CARDIOLOGY OFFICE NOTE   SABRINE, PATCHEN                 MRN:          [id number]  DATE:09/19/2006                            DOB:          May 07, 1930    Ms. Lauf returns today for followup post hospitalization for non-  cardiac chest pain.  Her biggest problem is poorly controlled  hypertension which is probably non compliance.  Once we achieved getting  her back on her standard medications, she was actually normotensive and  bradycardic.  We decreased her labetalol to 100 b.i.d.   Unfortunately, she is still taking 400 mg twice a day.   She still has lower extremity edema.  She has had very little chest  pain.  She does not sleep well but is using her C-PAP.  She has not been  to her diabetic consultation which we arranged in the hospital.  She  says she is going next week.   PHYSICAL EXAMINATION:  Blood pressure 107/52, which is excellent for  her.  Her pulse is 58 and regular.  Weight is 239, down 2 pounds.  She  is in no acute distress.  HEENT:  Unremarkable.  NECK:  Carotids are full.  There is no JVD that can be appreciated.  Thyroid is not enlarged.  LUNGS:  Clear.  HEART:  Reveals a soft S1, S2 without gallop.  ABDOMEN:  Soft, good bowel sounds.  EXTREMITIES:  Reveal only trace edema.  Pulses are intact.  NEURO EXAM:  Intact.   ASSESSMENT AND PLAN:  Ms. Colvin is doing remarkably well.  We  reviewed her medications and made sure she would take the Labetalol 100  mg b.i.d.  Everything else is unchanged.  Her blood pressure is  excellent.  I think as long as she takes her medications it will be  remarkably good.  She will follow up with the diabetes consultation and  counseling.  We will plan on seeing her back again in 3 months.     Thomas C. Daleen Squibb, MD, [facility]  Electronically Signed    TCW/MedQ  DD: 09/19/2006  DT: 09/19/2006  Job #: [id number]

## 2010-11-02 NOTE — Discharge Summary (Signed)
[place]. [facility]  Patient:    AAMORI, MCMASTERS                 MRN: [id number] Adm. Date:  [id number] Disc. Date: [id number] Attending:  Ruta Hinds Dictator:   Marya Fossa, P.A. CC:         Justine Null, M.D. [facility]  [facility] Salvia. Eliott Nine, M.D.   Discharge Summary  [facility] Medical Record Number [id number].  ADMISSION DIAGNOSES: 1. Shortness of breath and chest pain, rule out myocardial infarction. 2. Essentially normal coronary arteries November 2000. 3. Severe hypertension on multiple medications. 4. Known right renal artery stenosis. 5. Insulin-dependent diabetes mellitus. 6. Asthma. 7. Abdominal aortic aneurysm.  DISCHARGE DIAGNOSES: 1. Chest pain resolved, myocardial infarction ruled out with negative enzymes. 2. Status post cardiac catheterization revealing essentially normal coronary    arteries and left ventricular function.  Suspect symptoms related to    gastroesophageal reflux disease and lung disease. 3. Chronically elevated CK. 4. Severe hypertension on multiple medications. 5. Known right renal artery stenosis. 6. Insulin-dependent diabetes mellitus. 7. Asthma. 8. Abdominal aortic aneurysm.  HISTORY OF PRESENT ILLNESS:  Ms. Soderberg is a 77 year old black female with history of normal coronary arteries per catheterization November 2000, normal EF, severe hypertension on multiple medications, IDDM type 2, right renal artery stenosis.  She presented to the emergency room with chest tightness and shortness of breath.  Apparently over the last couple of days, she has been progressively short of breath with exertion.  This morning after showering, she developed shortness of breath.  She went to her hairdresser.  The shortness of breath continued, and she developed chest tightness with left arm discomfort, diaphoresis, nausea, but no emesis.  She took 1 sublingual nitroglycerin while  waiting for EMS to arrive which helped.  EMS gave her O2 and breathing treatment which also helped.  In the emergency room, she had light tightness in her chest and minimal shortness of breath.  EKG showed no acute abnormalities.  The patient has a history of breathing trouble and is scheduled for a sleep study in January 2002.  In the emergency room, her blood pressure was 186/84, CK elevated at 356, MB 10.6, and troponin 0.02.  As mentioned, she has chronically elevated CK and MB.  The patient will be admitted with chest pain, rule out MI, therapy for elevated blood pressure.  PROCEDURES: 1. Spiral CT. 2. Lower extremity Dopplers. 3. Cardiac catheterization June 09, 2000, by Dr. Nanetta Batty.  CONSULTATIONS:  Internal medicine for assistance with management of medical problems.  COMPLICATIONS:  None.  COURSE IN HOSPITAL:  Ms. Cavan was admitted to a telemetry bed for evaluation of shortness of breath, chest pain, and elevated blood pressure. Nitroglycerin was started but heparin and Plavix were not given as there was a low suspicion of cardiac etiology.  Teveten and minoxidil were added to her blood pressure regimen.  Cardiac enzymes remained elevated, but troponin remained negative.  LD 3, 4, and 5 within normal limits.  LDH isoenzyme within normal limits.  ESR 10.  ANA negative.  Aldolase within normal limits.  TSH within normal limits.  Total cholesterol 185, triglycerides 91, HDL 55, and LDL 112.  BMP and CBC within normal limits.  Chest x-ray stable without acute findings.  Internal medicine was consulted on June 06, 2000, to manage medical problems.  The recommended 24-hour creatinine clearance and ordered the sed rate, ANA, and aldolase.  Blood pressure  control improved by June 07, 2000, was 135/58.  Lower extremity Dopplers were ordered and were negative.  Spiral CT was ordered to rule out PE and was negative.  The patient had a slight bump in  creatinine on June 09, 2000, to 1.7, and this was felt to be related to dye during CT scan.  She was hydrated, and cardiac catheterization was planned as patient complained of chest discomfort and shortness of breath on exertion.  On June 09, 2000, Dr. Allyson Sabal took the patient to the catheterization lab. This revealed normal coronary arteries and normal left ventricular function. RA 15, RV 55/18, PA 50/23, PCWP 22.  It was felt the patient had moderate pulmonary hypertension and probable restrictive lung disease.  Medical therapy was recommended.  On June 10, 2000, the patient was felt suitable for discharge to home. Her symptoms had improved, and cardiac etiology was ruled out for her mild chest discomfort.  Blood pressure control was improved.  She is still pending for sleep study as an outpatient for further workup.  The patient is discharged to home on June 10, 2000.  DISCHARGE MEDICATIONS:  1. Lasix 40 mg a day.  2. Actos 30 mg.  3. Minoxidil 2.5 mg a day.  4. Clonidine 0.1 mg b.i.d.  5. Nexium 40 mg.  6. Normodyne 100 mg twice a day.  7. Teveten 200 mg.  8. Aspirin 81 mg.  9. Glucotrol 10 mg 10. Serzone 50 mg twice a day. 11. Insulin as before. 12. Glucophage 1000 mg twice a day to be restarted June 11, 2000. 13. Advair 100/50 twice a day. 14. Atrovent inhaler 2 puffs every 6 hours.  The patient is to stop Adalat, stop Norvasc and Diovan, stop Cardura and Zaroxolyn.  ACTIVITY:  No strenuous activity, lifting more than 5 pounds, or driving for two days.  DIET:  Low fat, low cholesterol, low salt, diabetic.  WOUND CARE:  She may shower.  FOLLOWUP:  She is to call Dr. Mancel Parsons office for a two-week followup.  She is to call Dr. Henderson Cloud office for a two-week followup appointment. DD:  06/27/99 TD:  06/27/00 Job: [id number]

## 2010-11-02 NOTE — Discharge Summary (Signed)
[place]. [facility]  Patient:    [name], [name]                 MRN: [id number] Adm. Date:  [id number] Disc. Date: 05/14/00 Attending:  Justine Null Dictator:   Cornell Barman, P.A. CC:         Justine Null, M.D. [facility]  Barbaraann Share, M.D. [facility]  Richard A. Alanda Amass, M.D.   Discharge Summary  DISCHARGE DIAGNOSES: 1. Atypical chest pain. 2. Gastroesophageal reflux disease. 3. Dyspnea.  BRIEF ADMISSION HISTORY:  Ms. Height is a 77 year old African-American female, who had an episode of chest pain associated with shortness of breath, diaphoresis and one episode of nausea and vomiting while shopping at the grocery store.  She was transported by EMS to [facility] Emergency Room.  She was evaluated by Dr. Stacie Acres in the emergency department, who recommended admission to rule out cardiac etiology of her chest pain.  The patient was admitted to Dr. Mancel Parsons service, however, upon evaluation by cardiology they did not feel she was having any evidence of cardiac ischemia.  At that point, internal medicine was asked to resume care.  The patient did describe symptoms of possible sleep apnea.  She states that she was diagnosed with asthma three to four years ago and does use an inhaler.  She admits to dyspnea on exertion. She admits to orthopnea, paroxysmal nocturnal dyspnea.  She does snore at night and her husband stated that she does have periods of apnea at night.  HOSPITAL COURSE: #1 - CHEST PAIN:  Not thought to be secondary to cardiac ischemia.  We did check a V/Q scan that was low probability for pulmonary embolus.  We also started the patient on some Protonix to rule out gastroesophageal reflux disease.  In regards to her pulmonary status, we have arranged a sleep medicine consult with Dr. Shelle Iron for December 19.  The patient is ambulating in the halls and maintaining sats at 94% with exertion.  DISCHARGE LABORATORY DATA:  A  chest x-ray showed borderline cardiac size with left ventricular prominence.  V/Q was low probability.  Initial CK was 415 with an MB of 11.4.  Troponin was 0.04.  CBC with differential was normal.  I-STAT 8 was essentially normal.  A free T4 and a TSH are still pending at the time of this dictation.  MEDICATIONS AT DISCHARGE:  1. Normodyne 100 mg b.i.d.  2. ______ 90 mg q.d.  3. Cardura 8 mg q.d.  4. Zaroxolyn 2.5 mg q.d.  5. Diovan 160 mg b.i.d.  6. Glipizide 10 mg q.d.  7. Lasix 160 mg b.i.d.  8. Serzone 50 mg b.i.d.  9. Glucophage 1000 mg b.i.d. 10. Norvasc 10 mg b.i.d. 11. Actos 30 mg b.i.d. 12. Catapres patch 0.3 mg q.d. 13. Clonidine 0.1 mg t.i.d. 14. Humalog insulin 25 units in the morning 35 units at lunch and 45 units     at in the evening. 15. Advair 100/50 one puff b.i.d.  FOLLOWUP:  Sleep medicine consult with Dr. Shelle Iron, December 19 at 2:15 p.m. Followup with Drs. Everardo All and [place] as needed. DD:  05/14/00 TD:  05/14/00 Job: [id number]

## 2010-11-02 NOTE — Discharge Summary (Signed)
[name], [name]          ACCOUNT NO.:  [id number]   MEDICAL RECORD NO.:  [id number]          PATIENT TYPE:  INP   LOCATION:  4733                         FACILITY:  [facility]   PHYSICIAN:  Rene Paci, M.D. LHCDATE OF BIRTH:  Sep 13, 1929   DATE OF ADMISSION:  12/07/2005  DATE OF DISCHARGE:                                 DISCHARGE SUMMARY   DISCHARGE DIAGNOSES:  1.  Shortness of breath, multifactorial, predominantly due to pulmonary      edema from uncontrolled hypertension resulting in congestive heart      failure exacerbation, improved.  2.  Uncontrolled type 2 diabetes with elevated A1c at 11.2, suspect      noncompliance.  Continue home prescribed Lantus plus close outpatient      followup.  3.  Uncontrolled hypertension on admission, now improved.  Discharge blood      pressure 133/58.  4.  Carotid bruits status post carotid Doppler ultrasound inpatient June 25.      Results pending at time of dictation.  Outpatient followup on results.  5.  Obstructive sleep apnea.  Continue continuous positive airway pressure      at bedtime with 2 liters of oxygen as prior to admission.  6.  History of asthma.  Continue home Advair.  No exacerbation this      hospitalization.  7.  Morbid obesity.  8.  Dyslipidemia.  9.  Mild hypokaliemia secondary to diuresis, replace.   ALLERGIES:  IV DYE.   DISCHARGE MEDICATIONS:  1.  Lantus 85 units b.i.d. or scheduled insulin prior to admission.  Close      outpatient followup with primary MD, as below.  2.  Nexium 40 mg once daily.  3.  Aspirin 81 mg once daily.  4.  Advair 100/50 b.i.d.  5.  Crestor 40 mg p.o. q.p.m.  6.  Reglan 10 mg 3 times daily.  7.  Zetia 10 mg once daily.  8.  Lexapro 10 mg once daily.  9.  Metaxalone 5 mg once a.m.   NEW MEDICATIONS:  1.  Avapro 300 mg once daily.  2.  Lasix 80 mg b.i.d.  3.  Potassium 20 mEq once a.m.  4.  Labetalol 200 mg tablets, 2 tablets b.i.d.  5.  Hydralazine 25 mg 1 t.i.d.   Patient has been stopped from her previously prescribed Diovan HCT and  Toprol due to the above listed changes.   FOLLOWUP:  Hospital followup will be with primary care physician, Dr. Romero Belling, initially scheduled for today but to be rescheduled in the next 1-2  weeks by patient who agrees to do so.  Also outpatient followup with  cardiologist, Dr. Daleen Squibb, 4-6 weeks or as needed.   CONSULTATIONS:  This hospitalization with [facility].   CONDITION ON DISCHARGE:  Medically improved and stable.  Sating 94% on room  air.  Breathing well.  Been no complaints of pain.  Above medication changes  and prescriptions have been reviewed with patient who understands and  agrees.   HOSPITAL COURSE BY PROBLEM:  1.  CHF exacerbation.  Patient is a pleasant 77 year old woman with apparent  history of noncompliance who presented to the emergency room complaining      of shortness of breath.  She was found to be with pulmonary edema by      chest x-ray as well as diffuse generalized lower extremity edema.  In      the emergency room, her blood pressure was remarkably elevated with a      systolic pressure of 216 and treated with IV Lasix, IV nitro and      noninvasive ventilation.  She diuresed well and improved rapidly.      Because of need for BiPAP, was initially in step-down unit but then      transitioned to a telemetry bed without abnormality.  She was placed on      full dose Lovenox until acute cardiac event or PE could be excluded.      Her cardiac enzymes were mildly abnormal likely due to the      stress/pulmonary edema from marked hypertension but a cardiology consult      was obtained to asses the need for further ischemic testing.  She was      seen by Dr. Daleen Squibb, who agreed that because of her previous negative      cardiac catheterization in December of 2001, there is no urgent need for      ischemic evaluation or rather better control of her blood pressure and      diabetes  and overall volume status.  Efforts were made in this regard      and reflected in the above changes in her medication regimen.  During      this hospitalization on the above listed regimen her blood pressure has      been under good control.  See below regarding her diabetes and CBG      control.  She continues on her CPAP at night with O2 q.h.s. and no other      acute abnormalities identified.  She is felt stable for discharge home      at this time without patient followup with primary MD and cardiology as      listed above.  2.  Uncontrolled type 2 diabetes.  Patient is insulin dependent at home      stating that she takes 85 units twice daily when asked but did not      initially report this to her admitting physician.  Her CBG is ranged      very high and she was begun on a dose of Lantus, initially 15 units      daily in addition to sliding scale.  Her insulin doses have been      titrated up slowly, now at 40 units b.i.d., and her CBG is still ranging      in the 300s.  Patient is unsure if it is in fact Lantus or NPH that she      is taking twice daily but she is told to resume her home scheduled      diabetes whichever it may be and review with her primary MD to ensure      adequate dosing.  If in fact she is taking a home insulin 85 units      b.i.d., she may need further titration even with her compliance as her      hemoglobin A1c this hospitalization returned at 11.2 showing longterm      poor compliance.  3.  Carotid bruits.  On exam, patient was incidentally noted  to have a      carotid bruit and a Doppler ultrasound was performed this      hospitalization, however, the final report results are not available at      the time of this dictation.  Arrange outpatient followup with primary MD      and/or cardiology.   OTHER MEDICAL ISSUES:  Patient's other medical issues are as listed above. No unintended changes have been made in her regimen as listed above except  as noted.   Patient is stable for discharge home.      Rene Paci, M.D. [facility]  Electronically Signed     VL/MEDQ  D:  12/10/2005  T:  12/10/2005  Job:  [id number]

## 2010-11-02 NOTE — H&P (Signed)
NAMECRISTELA, STALDER          ACCOUNT NO.:  [id number]   MEDICAL RECORD NO.:  [id number]          PATIENT TYPE:  INP   LOCATION:  2024                         FACILITY:  [facility]   PHYSICIAN:  Bimal R. Sherryll Burger, MD      DATE OF BIRTH:  09/28/1929   DATE OF ADMISSION:  09/01/2006  DATE OF DISCHARGE:                              HISTORY & PHYSICAL   PRIMARY CARDIOLOGIST:  Dr. Valera Castle   PRIMARY CARE:  Dr. Romero Belling   Mrs. Weinheimer is a very pleasant 77 year old African-American female  followed by Dr. Juanito Doom and Dr. Everardo All.  No definitive history of  coronary artery disease.  She underwent a cardiac catheterization in  December 2007 that showed no source of ischemia.  The patient has a  history of diastolic heart failure with pulmonary hypertension in the  setting of obstructive sleep apnea.  Mrs. Nola has several  comorbidities including uncontrolled diabetes, morbid obesity,  hypertension.  She was at home today in her usual state of health.  Around 2 p.m. she was making lunch when she had sudden onset of  substernal chest discomfort radiating up into her neck and down her  right arm.  She states her right arm became numb.  The discomfort in her  chest was sharp.  She states it was worse with a deep breath.  She also  became dizzy.  She went and laid down because she was short of breath.  She put her CPAP on with no relief.  She called Dr. Vern Claude office and  was told to call 9-1-1 which the patient did.  On arrival to [facility]  emergency room she stated she was having chest pain radiating through to  her back.  Her blood pressure was 96/50.  She was afebrile, respirations  22, with a pulse of 50.  She was saturating 100% on 2 L which had been  started en route.  The patient was given aspirin and nitroglycerin with  no relief in her chest discomfort.  Point-of-care markers were negative  x2 sets here in the emergency room.  We were asked by Dr. Denton Lank to see  the  patient for further evaluation.  Mrs. Paris states she has been  feeling well, has had difficulty controlling her diabetes.  She states  her CBGs have been running in the 200s and Dr. Everardo All recently  increased her insulin to 140 units in the morning and 70 in the evening.  Currently she is complaining of the substernal discomfort.  It is worse  when she takes a deep breath.   PAST MEDICAL HISTORY:  1. Obstructive sleep apnea with compliance to CPAP.  2. Diastolic heart failure with an EF of 60% by cardiac      catheterization in December 2007.  3. Diabetes, currently insulin dependent.  4. Hypertension.  5. Pulmonary hypertension.  6. Asthma.  7. Gallstones by ultrasound in the past.  8. GERD.  9. Esophageal strictures.  10.Status post craniotomy with burr holes in 1997.  11.Diabetic neuropathy.  12.History of vertigo and dizziness.  13.Status post Doppler study showing 0-39% bilateral ICA stenosis.  14.Depression.  15.Morbid obesity.  16.Hyperlipidemia.  17.IVP DYE allergy.   MEDICATIONS:  1. Insulin 75/25.  The patient takes 140 units in the morning and 70      in the evening.  2. Aspirin 325.  3. Imdur 30.  4. Advair 100/50.  5. Crestor 40.  6. Klor-Con 20.  7. Labetalol 200 b.i.d.  8. Hydralazine 50 t.i.d.  9. Avapro 300.  10.Lasix 80.  11.Metolazone 2.5 mg daily.  The patient was recently started on the      metolazone by Dr. Daleen Squibb on March 4 for improved blood pressure      control.   SOCIAL HISTORY:  The patient lives in [place] with her husband.  She  is retired.  She has adult children.  She denies ever smoking or using  alcohol or drugs.  She walks with a walker or cane for assistance.  She  tries to follow an ADA diet.   FAMILY HISTORY:  Father with a history of pacemaker, unknown etiology.   REVIEW OF SYSTEMS:  Positive for chills, chest pain, shortness of  breath, orthopnea.  The patient states she sleeps on two pillows  sometimes but denies any  peripheral edema.  GERD symptoms and  abdominal/epigastric discomfort.   PHYSICAL EXAMINATION:  VITAL SIGNS:  Temperature 97.4, blood pressure  144/57, pulse 60, respirations 18, saturating 100% on 2 L.  GENERAL:  Mrs. Dziuba is in no acute distress, very pleasant and  cooperative 77 year old African-American female.  HEENT:  Normocephalic, atraumatic.  Pupils equal, round, and reactive to  light.  Neck veins are flat.  No bruits heard.  CARDIOVASCULAR:  S1 and S2, regular rate and rhythm.  LUNGS:  Clear to auscultation bilaterally.  ABDOMEN:  Soft, nontender, morbidly obese.  CHEST:  Somewhat tender to palpation.  LOWER EXTREMITIES:  Without clubbing, cyanosis or edema.  NEUROLOGIC:  The patient alert and oriented x3.   Chest x-ray showing cardiac enlargement with vascular congestion, no  edema.  EKG:  Sinus rhythm at a rate of 52, T-wave inversion without  change in anterior septal leads.  Lab work:  H&H 13.3/39.9; wbc's 8.1;  platelets 261,000.  Sodium 136, potassium 4.1, chloride 97, CO2 30, BUN  24, creatinine 1.7.  Note the patient's last BUN/creatinine done in  February at which time the patient had a BUN 20 and a creatinine of 1.3.  Glucose currently 285.  Point-of-care enzymes negative x2.  BNP 97.  Lipase 32.  D-dimer 1.38.   Dr. Sherryll Burger in to examine and assess patient with atypical chest pain which  is reproducible at this time in the setting of diastolic heart failure,  diabetes, hypertension, morbid obesity, showing signs of acute renal  failure with an increase in BUN and creatinine from baseline.   Plan will be to admit the patient for observation, hold diuretics and  potassium, reevaluate labs in the morning.  Check one set of cardiac  enzymes.  If no further chest discomfort and kidney function is stable,  most likely could discharge home tomorrow for followup with Dr. Daleen Squibb.      Dorian Pod, ACNP   ______________________________  Eston Esters Sherryll Burger, MD     MB/MEDQ  D:  09/01/2006  T:  09/02/2006  Job:  [id number]

## 2010-11-02 NOTE — Cardiovascular Report (Signed)
NAMELINDSI, BAYLISS          ACCOUNT NO.:  [id number]   MEDICAL RECORD NO.:  [id number]          PATIENT TYPE:  INP   LOCATION:  2010                         FACILITY:  [facility]   PHYSICIAN:  Everardo Beals. Juanda Chance, MD, FACCDATE OF BIRTH:  [birth year]   DATE OF PROCEDURE:  06/12/2006  DATE OF DISCHARGE:                            CARDIAC CATHETERIZATION   HISTORY:  Ms. Mings is a 77 year old who has insulin dependent  diabetes, hypertension, history of congestive heart failure related to  diastolic dysfunction.  She was admitted from Dr. George Hugh office with  chest pain and shortness of breath.  Her EKG showed upriding of the  inferior T-waves and new inferior Q waves from her previous tracings.  She also had borderline elevated MB and troponins.  She was scheduled  for evaluation angiography today.   PROCEDURE:  Right heart catheterization was performed percutaneously via  right femoral vein using the sheath and Swan-Ganz thermodilution  catheter.  Left heart catheterization was performed percutaneously via  femoral artery using arterial sheath and 6 French preformed coronary  catheters.  A front wall arterial puncture was performed and Omnipaque  contrast was used.  The patient tolerated the procedure well and left  the laboratory in satisfactory condition.   RESULTS:  The left main coronary. The left main coronary artery was free  of significant disease.   Left anterior descending artery gave rise to two diagonal branches and  two septal perforators.  There was 30% narrowing in the mid LAD.  The  rest of the vessels were free of significant disease.   The circumflex artery gave rise to a marginal branch and posterolateral  branch.  There is 40% stenosis in the mid-to-distal circumflex artery  just after the marginal branch.   The right coronary artery was a dominant vessel that gave rise to two  ventricle branches, a posterior descending branch, and posterolateral  branch.   There is 30% narrowing in the proximal right coronary artery.   The left ventriculogram performed in RAO projection showed good wall  motion with no areas of hypokinesis.  Estimated ejection fraction 60%.   HEMODYNAMIC DATA:  Right atrial pressure was 13 mean.  The pulmonary  artery pressure was 60/21 with mean of 40.  Pulmonary wedge pressure was  26 mean.  Left ventricular 191/34 and aortic pressure was 185/80.  Cardiac output and cardiac index was 4.6/2.1 liters/minute/meters  squared by Fick.   CONCLUSION:  1. Nonobstructive coronary artery disease with 30% narrowing in the      mid-LAD, 40% narrowing in the mid distal circumflex artery, and 30%      narrowing in the proximal right coronary artery with normal left      ventricular function and estimated ejection fraction of 60%.  2. Elevated pulmonary artery wedge pressure and elevated pulmonary      artery pressure and pulmonary vascular resistance.   RECOMMENDATIONS:  The patient has no source of ischemia.  She does have  elevated left ventricular filling pressures consistent with diastolic  dysfunction.  She also has pulmonary hypertension and elevated pulmonary  resistance out of proportion to her filling pressures.  This may be  multiple multifactorial, including secondary to obstructive sleep apnea  and her elevated pulmonary wedge pressure.  She does have an elevated D-  dimer and we  will plan to do a CT scan with contrast to exclude pulmonary embolism.  We will make sure creatinine is stable tomorrow before proceeding with  that.  We will try and optimize her blood pressure control to help in  treatment of her diastolic dysfunction and will continue diuresis.      Bruce Elvera Lennox Juanda Chance, MD, [facility]  Electronically Signed     BRB/MEDQ  D:  06/12/2006  T:  06/13/2006  Job:  [id number]   cc:   Thomas C. Wall, MD, [facility]  Sean A. Everardo All, MD

## 2010-11-02 NOTE — Assessment & Plan Note (Signed)
[facility]                            CARDIOLOGY OFFICE NOTE   [name], [name]                 MRN:          [id number]  DATE:07/21/2006                            DOB:          1930-03-19    [name] returns today for management of her hypertension and  chronic diastolic heart failure. Please see the extensive note from the  [facility] on June 23, 2006.   Her Norvasc is at a maximum dose of 10 mg a day. She still has  persistent swelling. She says her blood pressures have been better at  home though her pressure today is 176/81. Her pulse is 86 and regular.  Her weight is unchanged at 244.   She has some dyspnea on exertion, but has no orthopnea or PND. Her  medicines are as outlined in the chart as noted before.   PHYSICAL EXAMINATION:  Blood pressure is 176/81, pulse is 86 and  regular. Weight is 244.  HEENT: Normocephalic, atraumatic. PERRLA. Extra-ocular movements intact.  There is no obvious JVD sitting upright. Carotid upstrokes are equal  bilaterally without bruits. Thyroid is not enlarged. Trachea is midline.  LUNGS:  Are clear to auscultation.  HEART: Reveals normal S1, S2 without gallop.  ABDOMEN: Soft with good bowel sounds. She is obese.  EXTREMITIES: Reveals 2+ pitting edema on the left and 1+ on the right.  Pulses are intact.   PLAN:  1. Decrease Norvasc to 5 mg a day.  2. Increase hydralazine from 25 t.i.d. to 50 t.i.d.  3. See her back in four weeks. Will also repeat some blood work at      that time.     Thomas C. Daleen Squibb, MD, [facility]  Electronically Signed    TCW/MedQ  DD: 07/21/2006  DT: 07/21/2006  Job #: [id number]

## 2010-11-02 NOTE — H&P (Signed)
[name], [name]                    ACCOUNT NO.:  [id number]   MEDICAL RECORD NO.:  [id number]                   PATIENT TYPE:  INP   LOCATION:  5151                                 FACILITY:  [facility]   PHYSICIAN:  Wanda Plump, MD [facility]                 DATE OF BIRTH:  1930/04/18   DATE OF ADMISSION:  07/19/2002  DATE OF DISCHARGE:                                HISTORY & PHYSICAL   CHIEF COMPLAINT:  Hip pain.   HISTORY OF PRESENT ILLNESS:  [name] is a 77 year old black female who  presented to the ER with a history of right hip pain.  The symptoms are  going on for awhile but they have been gradually getting worse over the  last six to eight weeks.  As an outpatient she was referred to orthopedic  surgeon, Dr. Luiz Blare, and apparently she was referred for physical therapy.  She has been doing physical therapy but the pain is gradually getting worse  and at this point is uncontrolled, making the patient basically bed bound.   PAST MEDICAL HISTORY:  1. Diabetes.  2. Hypertension.  3. Back in December, 2001 she had a heart catheterization which was     basically negative.  4. Bilateral tubal ligation.  5. Sleep apnea.  She uses CPAP at home.  6. She is status post brain surgery back in 1997.  The patient refers that     she had a blood clot.   FAMILY HISTORY:  Positive for coronary artery disease and her father had  cancer in the lymph nodes.   SOCIAL HISTORY:  Negative for alcohol and tobacco.   MEDICATIONS:  She takes:  1. Diovan 160/12.5 one p.o. b.i.d.  2. Lexapro 10 mg p.o. daily.  3. Cardura 8 mg p.o. daily.  4. Lipitor 20 mg daily.  5. Morphine 15 mg b.i.d.  6. Hydralazine 10 mg q.i.d.  7. Labetalol 200 mg b.i.d.  8. Quinine Sulfate 325 mg p.o. daily.  9. Lasix 20 mg p.o. b.i.d.  10.      Phenergan 35 mg p.r.n.  11.      Zaroxolyn 5 mg p.o. daily.   REVIEW OF SYSTEMS:  Basically unremarkable.  She denies any fever, chills,  cough, nausea, vomiting,  diarrhea.  No abdominal pain.  Her blood sugar is  around 180 when she checked it at home.   ALLERGIES:  She is allergic to CONTRAST DYE.   PHYSICAL EXAMINATION:  GENERAL:  At the time of exam the patient is alert,  oriented.  She is resting flat in the bed, in no apparent distress.  Apparently she was in distress when she arrived at the ER.  VITALS:  When she came in her blood pressure was 203/75 and right now is  180/56, oxygen saturation 96% on room air.  She is afebrile, respirations  20, pulse around 58.  LUNGS:  Clear to auscultation bilaterally.  CARDIOVASCULAR:  Regular rate and rhythm, without murmur.  ABDOMEN:  Obese, soft, good bowel sounds.  No organomegaly that I can tell.  EXTREMITIES:  There is no edema.  Calves are symmetric.  She has normal  pedal pulses.  The right hip motion is severely restricted due to pain.  In  fact, I cannot lift her leg or rotate her hip without causing excruciating  pain.  NEUROLOGICAL:  Speech is clear.  She is coherent.  On the motor exam of her  lower extremities is symmetric.   LABORATORY AND X-RAYS:  She only had blood sugar of 173.   ASSESSMENT/PLAN:  1. Severe right hip pain.  Unfortunately, at this point I do not think she     will be able to go home unless we send her by ambulance.  She is     basically bed bound.  The patient is unsure.  The patient has a poor     understanding of what is going on and in fact, she is not sure of the     diagnosis that she was given.  I will admit her for observation.  I am     going to attempt to get better control of the pain with Vioxx and Lorcet     and I am going to use Nubain as a p.r.n. medication.  I would suggest     that the hospital team consult orthopedics in the morning.  2. Hypertension.  This was extremely elevated upon arrival but now is     better.  I suspect that this was related with pain.  3. Diabetes.  The patient uses insulin and will find out the type of insulin     and the  dose before she goes to the floor.  4. If she is not more mobile within 24 hours she will need DVT prophylaxis     with heparin.                                               Wanda Plump, MD [facility]    JEP/MEDQ  D:  07/20/2002  T:  07/20/2002  Job:  [id number]

## 2010-11-02 NOTE — Assessment & Plan Note (Signed)
[facility]                            CARDIOLOGY OFFICE NOTE   [name], [name]                 MRN:          [id number]  DATE:08/19/2006                            DOB:          02-26-1930    [name] returns today for further management of her chronic  diastolic congestive heart failure and poorly controlled hypertension.   She says that she did not take her hydralazine or her labetalol this  morning because it makes her sleep all day.  I told her I did not think  this was one of the side effects.  Her blood sugars have been running  around 80 to 90, and I am wondering if perhaps she is getting  hypoglycemic.   She has had a lot of edema, but less so on Norvasc.  We had decreased  her Norvasc to 5 mg a day.  I had increased her hydralazine to 50 t.i.d.  Her other blood pressure medicines include:  1. Labetalol 400 b.i.d.  2. Isosorbide mononitrate 30 mg a day.  3. Lasix 80 mg p.o. b.i.d.  4. Avapro 300 mg a day.   Looking back through her chart, her systolics are usually running 160 to  170.  She did have a pressure of 130s last summer.  At that time, the  only drug different was metolazone 5 mg a day along with her Lasix.   EXAMINATION:  Her blood pressure is 198/85.  Her pulse is 78 and  regular.  Her weight is 241.  She is in no acute distress.  HEENT:  Unchanged from previous exam.  Carotid upstrokes are equal bilaterally without bruits.  JVD could not  be assessed.  LUNGS:  Were clear to auscultation and percussion.  HEART:  Reveals a soft S1 and S2 without gallop.  ABDOMINAL EXAM:  Protuberant with good bowel sounds.  EXTREMITIES:  Reveal 1+ to 2+ pitting edema.  Pulses are intact.  NEUROLOGIC:  Exam is intact.   ASSESSMENT AND PLAN:  I had a long talk with [name] today.  I have  tried to reassure her that I do not think Avapro or labetalol are  responsible for her sleepiness.  I have suggested she check her  blood  sugars, and think about adjusting her insulin.  Perhaps, Dr. Everardo All can  help her with this.   In addition, she does have some edema, and seems to have had a better  blood pressure on diuretic combination.  I have started her on  metolazone 2.5 mg a day with potassium supplementation.  We will see her  back in 2 weeks for a blood pressure check and a chem. 7.     Jesse Sans. Daleen Squibb, MD, [facility]  Electronically Signed    TCW/MedQ  DD: 08/19/2006  DT: 08/19/2006  Job #: [id number]   cc:   Gregary Signs A. Everardo All, MD

## 2010-11-16 ENCOUNTER — Telehealth: Payer: Self-pay | Admitting: Family Medicine

## 2010-11-16 NOTE — Telephone Encounter (Signed)
please call patient

## 2010-11-16 NOTE — Telephone Encounter (Signed)
[name], I have never seen [name] for pulmonary issues, just osa.  Do we know if she really has COPD?  I do not see any PFTs in the EMR.  If she truly has obstructive disease, and cannot operate an MDI, a nebulizer machine would not be unreasonable.

## 2010-11-16 NOTE — Telephone Encounter (Signed)
Mellody Dance: would this pt be appropriate for nebulizer?  Would you need to see her back to determine?  Rosine Solecki

## 2010-11-16 NOTE — Telephone Encounter (Signed)
H her pharmacy sent a note saying that she had difficulty pushing down on the HF inhaler. She would prefer a nebulizer. I will forward this to Dr. Everardo All who is her primary care physician.

## 2010-11-17 NOTE — Telephone Encounter (Signed)
please call patient: Ov this week.  We don't know if pt has copd or not

## 2010-11-19 ENCOUNTER — Encounter: Payer: Self-pay | Admitting: Endocrinology

## 2010-11-19 ENCOUNTER — Ambulatory Visit (INDEPENDENT_AMBULATORY_CARE_PROVIDER_SITE_OTHER): Payer: Medicare Other | Admitting: Endocrinology

## 2010-11-19 DIAGNOSIS — J45909 Unspecified asthma, uncomplicated: Secondary | ICD-10-CM

## 2010-11-19 NOTE — Patient Instructions (Signed)
Let me know if your hip and knee pain don't improve, so i can request x-rays for you. The best treatment for your shortness of breath is to stop periodically to rest.  change insulin to 220 units each morning, and none in the evening. Please make a follow-up appointment in 6 weeks.

## 2010-11-19 NOTE — Telephone Encounter (Signed)
Left message on machine for pt to return my call  

## 2010-11-19 NOTE — Progress Notes (Signed)
Subjective:    Patient ID: [name], female    DOB: 12-06-1929, 77 y.o.   MRN: [id number]  HPI Pt fell out of the bed 6 days ago.  She landed on her right side.  Since then, she has 6 days of slight pain at the right hip, and assoc pain at the right knee. She has doe.  She says mdi does not help. no cbg record, but states cbg's are high during the day, then low at night.  She still takes nph insulin bid. Past Medical History  Diagnosis Date  . Uterine cancer   . Diabetes mellitus     type II; peripheral neuropathy  . Hypertension   . GERD (gastroesophageal reflux disease)   . Peripheral vascular disease   . Obesity   . Dyslipidemia   . Diastolic CHF, chronic     EF 50-55%, mild LVH and grade 1 diast. Dysfxn  . Osteoarthritis cervical spine   . DM retinopathy   . Sleep apnea     with CPAP  . History of shingles   . COPD (chronic obstructive pulmonary disease)   . Asthma   . Depression   . Peptic ulcer disease     duodenal  . Peptic stricture of esophagus   . Hiatal hernia   . Diverticulosis   . Arthritis   . ASCVD (arteriosclerotic cardiovascular disease)     on MRI brin  . CAD (coronary artery disease)     a. s/p multiple caths with nonobs CAD;   b. cath 1/10: pLAD 20%, mLAD 40%, pCFX 20%, mCFX 40%, pRCA 60-70%    Past Surgical History  Procedure Date  . Tubal ligation 1967  . Knee arthroscopy 10/1998    Left  . Craniotomy 1997    Left for SDH  . Cataract extraction, bilateral   . Hernia repair   . Esophagogastroduodenoscopy 04/04/2004  . Spine surgery     C-spine and lumbar surgery  . Cholecystectomy 2010  . Cardiac catheterization     History   Social History  . Marital Status: Married    Spouse Name: N/A    Number of Children: 7  . Years of Education: N/A   Occupational History  . Retired    Social History Main Topics  . Smoking status: Never Smoker   . Smokeless tobacco: Not on file  . Alcohol Use: Yes     rare  . Drug Use:   .  Sexually Active:    Other Topics Concern  . Not on file   Social History Narrative  . No narrative on file    Current Outpatient Prescriptions on File Prior to Visit  Medication Sig Dispense Refill  . ADVAIR DISKUS 100-50 MCG/DOSE AEPB USE ONE (1) PUFF(S) TWICE DAILY  60 each  2  . aspirin 325 MG tablet Take 325 mg by mouth daily.        . furosemide (LASIX) 80 MG tablet 80 mg. Take 1 1/2 tabs twice a day.      Marland Kitchen glucose blood (FREESTYLE TEST STRIPS) test strip 2x a day, dx 250.01      . hydrALAZINE (APRESOLINE) 25 MG tablet TAKE ONE (1) TABLET(S) THREE (3) TIMES DAILY  90 tablet  5  . HYDROcodone-acetaminophen (NORCO) 10-325 MG per tablet Take 0.5-1 tablets by mouth every 4 (four) hours as needed.        . insulin NPH (HUMULIN N) 100 UNIT/ML injection Inject 220 Units into the skin daily before breakfast.       .  isosorbide dinitrate (ISORDIL) 30 MG tablet Take 30 mg by mouth 2 (two) times daily.        Marland Kitchen labetalol (NORMODYNE) 200 MG tablet Take 200 mg by mouth 2 (two) times daily.        . metolazone (ZAROXOLYN) 5 MG tablet Take 5 mg by mouth 2 (two) times daily.        . nitroGLYCERIN (NITROSTAT) 0.4 MG SL tablet Place 0.4 mg under the tongue every 5 (five) minutes as needed. MAX 3 doses       . ondansetron (ZOFRAN) 4 MG tablet Take 4 mg by mouth every 6 (six) hours as needed.        . potassium chloride SA (KLOR-CON M20) 20 MEQ tablet Take four tablets daily      . rosuvastatin (CRESTOR) 40 MG tablet Take 40 mg by mouth daily.        . traMADol (ULTRAM) 50 MG tablet Take 50 mg by mouth every 4 (four) hours as needed.        . VENTOLIN HFA 108 (90 BASE) MCG/ACT inhaler INHALE 2 PUFFS INTO THE LUNGS EVERY 6 (SIX) HOURS AS NEEDED FOR WHEEZING  18 g  0    Allergies  Allergen Reactions  . Amlodipine Besylate   . Iohexol      Code: RASH, Desc: JENNIFER STATES ON PT'S CHART ALLERGIC TO IV DYE 09/04/07/RM, Onset Date: [id number]     Family History  Problem Relation Age of Onset  .  Cancer Mother     "Stomach" Cancer  . Diabetes Mother   . Heart disease Mother   . Lymphoma Father   . Kidney disease Paternal Grandmother   . Asthma Other     BP 142/102  Pulse 71  Temp(Src) 97.7 F (36.5 C) (Oral)  Ht 5\' 6"  (1.676 m)  Wt 210 lb 3.2 oz (95.346 kg)  BMI 33.93 kg/m2  SpO2 95%   Review of Systems Denies loc and numbness    Objective:   Physical Exam GENERAL: no distress.  Obese LUNGS:  Clear to auscultation Gait: able to put only a small amount of weight on the right lower extremity. Right knee: there is an abrasion at the anterior aspect, approx 2x6 cm, transverse.  No drainage.  Erythema is minimal.    (i reviewed spirometry)   Assessment & Plan:  Dm, therapy limited by noncompliance.  i'll do the best i can. Hip and knee contusions, with improved pain.  Pt declines x-rays. Doe, prob due to deconditioning.

## 2010-11-19 NOTE — Telephone Encounter (Signed)
Pt advised and transferred to sch appt 

## 2011-01-03 ENCOUNTER — Encounter: Payer: Self-pay | Admitting: Endocrinology

## 2011-01-03 ENCOUNTER — Encounter: Payer: Self-pay | Admitting: Cardiology

## 2011-01-03 ENCOUNTER — Other Ambulatory Visit: Payer: Self-pay | Admitting: Endocrinology

## 2011-01-03 ENCOUNTER — Ambulatory Visit (INDEPENDENT_AMBULATORY_CARE_PROVIDER_SITE_OTHER): Payer: Medicare Other | Admitting: Endocrinology

## 2011-01-03 ENCOUNTER — Other Ambulatory Visit (INDEPENDENT_AMBULATORY_CARE_PROVIDER_SITE_OTHER): Payer: Medicare Other

## 2011-01-03 VITALS — BP 138/88 | HR 69 | Temp 98.2°F | Ht 66.0 in | Wt 218.1 lb

## 2011-01-03 DIAGNOSIS — R350 Frequency of micturition: Secondary | ICD-10-CM

## 2011-01-03 DIAGNOSIS — E119 Type 2 diabetes mellitus without complications: Secondary | ICD-10-CM

## 2011-01-03 DIAGNOSIS — R319 Hematuria, unspecified: Secondary | ICD-10-CM | POA: Insufficient documentation

## 2011-01-03 LAB — POCT URINALYSIS DIPSTICK
Bilirubin, UA: NEGATIVE
Ketones, UA: NEGATIVE
Leukocytes, UA: NEGATIVE
pH, UA: 5

## 2011-01-03 LAB — HEMOGLOBIN A1C: Hgb A1c MFr Bld: 10.5 % — ABNORMAL HIGH (ref 4.6–6.5)

## 2011-01-03 MED ORDER — CIPROFLOXACIN HCL 500 MG PO TABS: 500.0000 mg | ORAL_TABLET | Freq: Two times a day (BID) | ORAL | Status: AC

## 2011-01-03 NOTE — Patient Instructions (Addendum)
i have sent a prescription to your pharmacy, for an antibiotic. check your blood sugar 2 times a day.  vary the time of day when you check, between before the 3 meals, and at bedtime.  also check if you have symptoms of your blood sugar being too high or too low.  please keep a record of the readings and bring it to your next appointment here.  please call us sooner if you are having low blood sugar episodes. Please make a follow-up appointment in 3 months.  blood tests are being ordered for you today.  please call [phone] to hear your test results.  You will be prompted to enter the 9-digit "MRN" number that appears at the top left of this page, followed by #.  Then you will hear the message. (update: i left message on phone-tree:  Next step is to check cbg's)

## 2011-01-03 NOTE — Progress Notes (Signed)
Subjective:    Patient ID: [name], female    DOB: 1930-02-05, 77 y.o.   MRN: [id number]  HPI Pt states 3 weeks of slight pain in the context of urination, but no assoc gross hematuria.   She received a steroid injection into her back last week.  no cbg record, but states cbg's are improved to the 200's.  she was having mild hypoglycemia in the early hrs of the am.  However, changing the insulin to all in the am has eliminated this.   Past Medical History  Diagnosis Date  . Uterine cancer   . Diabetes mellitus     type II; peripheral neuropathy  . Hypertension   . GERD (gastroesophageal reflux disease)   . Peripheral vascular disease   . Obesity   . Dyslipidemia   . Diastolic CHF, chronic     EF 50-55%, mild LVH and grade 1 diast. Dysfxn  . Osteoarthritis cervical spine   . DM retinopathy   . Sleep apnea     with CPAP  . History of shingles   . COPD (chronic obstructive pulmonary disease)   . Depression   . Peptic ulcer disease     duodenal  . Peptic stricture of esophagus   . Hiatal hernia   . Diverticulosis   . Arthritis   . ASCVD (arteriosclerotic cardiovascular disease)     on MRI brin  . CAD (coronary artery disease)     a. s/p multiple caths with nonobs CAD;   b. cath 1/10: pLAD 20%, mLAD 40%, pCFX 20%, mCFX 40%, pRCA 60-70%  . Non-ischemic cardiomyopathy   . Asthma     Mild  . Pruritic condition     Idiopathic  . Hyperlipidemia   . Zoster   . Noncompliance     Past Surgical History  Procedure Date  . Tubal ligation 1967  . Knee arthroscopy 10/1998    Left  . Craniotomy 1997    Left for SDH  . Cataract extraction, bilateral 2005  . Hernia repair   . Esophagogastroduodenoscopy 04/04/2004  . Spine surgery     C-spine and lumbar surgery  . Cholecystectomy 2010  . Cardiac catheterization   . Dexa 7/05  . Abdominal hysterectomy     History   Social History  . Marital Status: Married    Spouse Name: N/A    Number of Children: 7  .  Years of Education: N/A   Occupational History  . Retired    Social History Main Topics  . Smoking status: Never Smoker   . Smokeless tobacco: Not on file  . Alcohol Use: Yes     rare  . Drug Use: No  . Sexually Active:    Other Topics Concern  . Not on file   Social History Narrative  . No narrative on file    Current Outpatient Prescriptions on File Prior to Visit  Medication Sig Dispense Refill  . ADVAIR DISKUS 100-50 MCG/DOSE AEPB USE ONE (1) PUFF(S) TWICE DAILY  60 each  2  . aspirin 325 MG tablet Take 325 mg by mouth daily.        . furosemide (LASIX) 80 MG tablet 80 mg. Take 1 1/2 tabs twice a day.      Marland Kitchen glucose blood (FREESTYLE TEST STRIPS) test strip 2x a day, dx 250.01      . hydrALAZINE (APRESOLINE) 25 MG tablet TAKE ONE (1) TABLET(S) THREE (3) TIMES DAILY  90 tablet  5  . HYDROcodone-acetaminophen (NORCO)  10-325 MG per tablet Take 0.5-1 tablets by mouth every 4 (four) hours as needed.        . insulin NPH (HUMULIN N) 100 UNIT/ML injection Inject 220 Units into the skin daily before breakfast.       . isosorbide dinitrate (ISORDIL) 30 MG tablet Take 30 mg by mouth 2 (two) times daily.        Marland Kitchen labetalol (NORMODYNE) 200 MG tablet Take 200 mg by mouth 2 (two) times daily.        . metolazone (ZAROXOLYN) 5 MG tablet Take 5 mg by mouth 2 (two) times daily.        . nitroGLYCERIN (NITROSTAT) 0.4 MG SL tablet Place 0.4 mg under the tongue every 5 (five) minutes as needed. MAX 3 doses       . ondansetron (ZOFRAN) 4 MG tablet Take 4 mg by mouth every 6 (six) hours as needed.        . potassium chloride SA (KLOR-CON M20) 20 MEQ tablet Take four tablets daily      . rosuvastatin (CRESTOR) 40 MG tablet Take 40 mg by mouth daily.        . traMADol (ULTRAM) 50 MG tablet Take 50 mg by mouth every 4 (four) hours as needed.        . VENTOLIN HFA 108 (90 BASE) MCG/ACT inhaler INHALE 2 PUFFS INTO THE LUNGS EVERY 6 (SIX) HOURS AS NEEDED FOR WHEEZING  18 g  0    Allergies  Allergen  Reactions  . Amlodipine Besylate   . Iohexol      Code: RASH, Desc: JENNIFER STATES ON PT'S CHART ALLERGIC TO IV DYE 09/04/07/RM, Onset Date: 78295621     Family History  Problem Relation Age of Onset  . Cancer Mother     "Stomach" Cancer  . Diabetes Mother   . Heart disease Mother   . Stomach cancer Mother   . Lymphoma Father   . Kidney disease Paternal Grandmother   . Asthma Other    BP 138/88  Pulse 69  Temp(Src) 98.2 F (36.8 C) (Oral)  Ht 5\' 6"  (1.676 m)  Wt 218 lb 1.9 oz (98.939 kg)  BMI 35.21 kg/m2  SpO2 95% Review of Systems Denies fever and low-back pain.      Objective:   Physical Exam GENERAL: no distress.  Obese. Abd: slight suprapubic tenderness.    Lab Results  Component Value Date   HGBA1C 10.5* 01/03/2011   Assessment & Plan:  Hematuria, ? Due to uti Dm, therapy limited by noncompliance with cbg monitoring, her need for a simple regimen, and by cost factors.  i'll do the best i can. Back pain.  Steroid injection, while needed, are worsening dm control

## 2011-01-05 LAB — URINE CULTURE

## 2011-01-06 LAB — URINE CULTURE: Colony Count: >=100000

## 2011-01-07 ENCOUNTER — Encounter: Payer: Self-pay | Admitting: Cardiology

## 2011-01-07 ENCOUNTER — Ambulatory Visit (INDEPENDENT_AMBULATORY_CARE_PROVIDER_SITE_OTHER): Payer: Medicare Other | Admitting: Cardiology

## 2011-01-07 VITALS — BP 138/84 | HR 84 | Ht 66.0 in | Wt 221.0 lb

## 2011-01-07 DIAGNOSIS — I509 Heart failure, unspecified: Secondary | ICD-10-CM

## 2011-01-07 DIAGNOSIS — I2510 Atherosclerotic heart disease of native coronary artery without angina pectoris: Secondary | ICD-10-CM

## 2011-01-07 DIAGNOSIS — I10 Essential (primary) hypertension: Secondary | ICD-10-CM

## 2011-01-07 DIAGNOSIS — I5032 Chronic diastolic (congestive) heart failure: Secondary | ICD-10-CM

## 2011-01-07 DIAGNOSIS — R609 Edema, unspecified: Secondary | ICD-10-CM

## 2011-01-07 DIAGNOSIS — R0989 Other specified symptoms and signs involving the circulatory and respiratory systems: Secondary | ICD-10-CM

## 2011-01-07 NOTE — Patient Instructions (Signed)
Your physician recommends that you schedule a follow-up appointment in: 6 months with Dr. Wall  

## 2011-01-07 NOTE — Assessment & Plan Note (Signed)
Stable. A change in treatment. Followup in 6 months

## 2011-01-07 NOTE — Progress Notes (Signed)
HPI [name] comes in for evaluation management of her coronary artery disease, chronic diastolic heart failure, hypertension, and left carotid bruit.  She's having no symptoms of angina or ischemia. She denies orthopnea, PND or any increased edema. Her weight has been stable and she is weighing daily with the scales her insurance company provided. There calling her on a regular basis.  Today shows normal sinus rhythm with a right axis and some ST segment changes inferior laterally. Past Medical History  Diagnosis Date  . Uterine cancer   . Diabetes mellitus     type II; peripheral neuropathy  . Hypertension   . GERD (gastroesophageal reflux disease)   . Peripheral vascular disease   . Obesity   . Dyslipidemia   . Diastolic CHF, chronic     EF 50-55%, mild LVH and grade 1 diast. Dysfxn  . Osteoarthritis cervical spine   . DM retinopathy   . Sleep apnea     with CPAP  . History of shingles   . COPD (chronic obstructive pulmonary disease)   . Depression   . Peptic ulcer disease     duodenal  . Peptic stricture of esophagus   . Hiatal hernia   . Diverticulosis   . Arthritis   . ASCVD (arteriosclerotic cardiovascular disease)     on MRI brin  . CAD (coronary artery disease)     a. s/p multiple caths with nonobs CAD;   b. cath 1/10: pLAD 20%, mLAD 40%, pCFX 20%, mCFX 40%, pRCA 60-70%  . Non-ischemic cardiomyopathy   . Asthma     Mild  . Pruritic condition     Idiopathic  . Hyperlipidemia   . Zoster   . Noncompliance     Past Surgical History  Procedure Date  . Tubal ligation 1967  . Knee arthroscopy 10/1998    Left  . Craniotomy 1997    Left for SDH  . Cataract extraction, bilateral 2005  . Hernia repair   . Esophagogastroduodenoscopy 04/04/2004  . Spine surgery     C-spine and lumbar surgery  . Cholecystectomy 2010  . Cardiac catheterization   . Dexa 7/05  . Abdominal hysterectomy     Family History  Problem Relation Age of Onset  . Cancer Mother    "Stomach" Cancer  . Diabetes Mother   . Heart disease Mother   . Stomach cancer Mother   . Lymphoma Father   . Kidney disease Paternal Grandmother   . Asthma Other     History   Social History  . Marital Status: Married    Spouse Name: N/A    Number of Children: 7  . Years of Education: N/A   Occupational History  . Retired    Social History Main Topics  . Smoking status: Never Smoker   . Smokeless tobacco: Not on file  . Alcohol Use: Yes     rare  . Drug Use: No  . Sexually Active:    Other Topics Concern  . Not on file   Social History Narrative  . No narrative on file    Allergies  Allergen Reactions  . Amlodipine Besylate   . Iohexol      Code: RASH, Desc: JENNIFER STATES ON PT'S CHART ALLERGIC TO IV DYE 09/04/07/RM, Onset Date: 16109604     Current Outpatient Prescriptions  Medication Sig Dispense Refill  . ADVAIR DISKUS 100-50 MCG/DOSE AEPB USE ONE (1) PUFF(S) TWICE DAILY  60 each  2  . aspirin 325 MG tablet Take  325 mg by mouth daily.        . ciprofloxacin (CIPRO) 500 MG tablet Take 1 tablet (500 mg total) by mouth 2 (two) times daily.  20 tablet  0  . ezetimibe (ZETIA) 10 MG tablet Take 10 mg by mouth daily.        . furosemide (LASIX) 80 MG tablet 80 mg. Take 1 1/2 tabs twice a day.      Marland Kitchen glucose blood (FREESTYLE TEST STRIPS) test strip 2x a day, dx 250.01      . hydrALAZINE (APRESOLINE) 25 MG tablet TAKE ONE (1) TABLET(S) THREE (3) TIMES DAILY  90 tablet  5  . HYDROcodone-acetaminophen (NORCO) 10-325 MG per tablet Take 0.5-1 tablets by mouth every 4 (four) hours as needed.        . insulin NPH (HUMULIN N) 100 UNIT/ML injection Inject 220 Units into the skin daily before breakfast.       . isosorbide dinitrate (ISORDIL) 30 MG tablet Take 30 mg by mouth 2 (two) times daily.        Marland Kitchen labetalol (NORMODYNE) 200 MG tablet Take 200 mg by mouth 2 (two) times daily.        . metolazone (ZAROXOLYN) 5 MG tablet Take 5 mg by mouth 2 (two) times daily.        .  nitroGLYCERIN (NITROSTAT) 0.4 MG SL tablet Place 0.4 mg under the tongue every 5 (five) minutes as needed. MAX 3 doses       . NON FORMULARY CPAP Machine: once daily.       . ondansetron (ZOFRAN) 4 MG tablet Take 4 mg by mouth every 6 (six) hours as needed.        . potassium chloride SA (KLOR-CON M20) 20 MEQ tablet Take four tablets daily      . rosuvastatin (CRESTOR) 40 MG tablet Take 40 mg by mouth daily.        . traMADol (ULTRAM) 50 MG tablet Take 50 mg by mouth every 4 (four) hours as needed.        . VENTOLIN HFA 108 (90 BASE) MCG/ACT inhaler INHALE 2 PUFFS INTO THE LUNGS EVERY 6 (SIX) HOURS AS NEEDED FOR WHEEZING  18 g  0    ROS Negative other than HPI.   PE General Appearance: well developed, well nourished in no acute distress, obese HEENT: symmetrical face, PERRLA, good dentition  Neck: no JVD, thyromegaly, or adenopathy, trachea midline Chest: symmetric without deformity Cardiac: PMI non-displaced, RRR, normal S1, S2, no gallop or murmur Lung: clear to ausculation and percussion Vascular: all pulses full without bruits  Abdominal: nondistended, nontender, good bowel sounds, no HSM, no bruits Extremities: no cyanosis, clubbing , 1+ edema bilaterally in the lower extremities no sign of DVT, no varicosities  Skin: normal color, no rashes Neuro: alert and oriented x 3, non-focal Pysch: normal affect  Filed Vitals:   01/07/11 0955 01/07/11 1011  BP: 180/90 138/84  Pulse: 84   Height: 5\' 6"  (1.676 m)   Weight: 221 lb (100.245 kg)     EKG  Labs and Studies Reviewed.   Lab Results  Component Value Date   WBC 6.6 09/17/2010   HGB 14.7 09/17/2010   HCT 47.0* 09/17/2010   MCV 90.9 09/17/2010   PLT 252 09/17/2010      Chemistry      Component Value Date/Time   NA 139 10/22/2010 1019   K 3.8 10/22/2010 1019   CL 99 10/22/2010 1019   CO2 29  10/22/2010 1019   BUN 27* 10/22/2010 1019   CREATININE 1.4* 10/22/2010 1019      Component Value Date/Time   CALCIUM 9.1 10/22/2010 1019    CALCIUM 9.7 01/22/2010 2344   ALKPHOS 50 09/17/2010 0525   AST 27 09/17/2010 0525   ALT 30 09/17/2010 0525   BILITOT 1.1 09/17/2010 0525       Lab Results  Component Value Date   CHOL  Value: 240        ATP III CLASSIFICATION:  <200     mg/dL   Desirable  409-811  mg/dL   Borderline High  >=914    mg/dL   High       * 7/82/9562   CHOL 207* 07/06/2010   CHOL 188 05/18/2009   Lab Results  Component Value Date   HDL 66 09/11/2010   HDL 65.80 07/06/2010   HDL 13.08 05/18/2009   Lab Results  Component Value Date   LDLCALC  Value: 160        Total Cholesterol/HDL:CHD Risk Coronary Heart Disease Risk Table                     Men   Women  1/2 Average Risk   3.4   3.3  Average Risk       5.0   4.4  2 X Average Risk   9.6   7.1  3 X Average Risk  23.4   11.0        Use the calculated Patient Ratio above and the CHD Risk Table to determine the patient's CHD Risk.        ATP III CLASSIFICATION (LDL):  <100     mg/dL   Optimal  657-846  mg/dL   Near or Above                    Optimal  130-159  mg/dL   Borderline  962-952  mg/dL   High  >841     mg/dL   Very High* 09/07/4008   LDLCALC 103* 05/18/2009   LDLCALC  Value: 117        Total Cholesterol/HDL:CHD Risk Coronary Heart Disease Risk Table                     Men   Women  1/2 Average Risk   3.4   3.3  Average Risk       5.0   4.4  2 X Average Risk   9.6   7.1  3 X Average Risk  23.4   11.0        Use the calculated Patient Ratio above and the CHD Risk Table to determine the patient's CHD Risk.        ATP III CLASSIFICATION (LDL):  <100     mg/dL   Optimal  272-536  mg/dL   Near or Above                    Optimal  130-159  mg/dL   Borderline  644-034  mg/dL   High  >742     mg/dL   Very High* 10/23/5636   Lab Results  Component Value Date   TRIG 72 09/11/2010   TRIG 104.0 07/06/2010   TRIG 102.0 05/18/2009   Lab Results  Component Value Date   CHOLHDL 3.6 09/11/2010   CHOLHDL 3 07/06/2010   CHOLHDL 3 05/18/2009   Lab Results  Component Value Date  HGBA1C  10.5* 01/03/2011   Lab Results  Component Value Date   ALT 30 09/17/2010   AST 27 09/17/2010   ALKPHOS 50 09/17/2010   BILITOT 1.1 09/17/2010   Lab Results  Component Value Date   TSH 1.86 07/06/2010

## 2011-01-07 NOTE — Assessment & Plan Note (Signed)
Asymptomatic. Symptoms and how to respond to TIAs reinforced. Continue medical therapy.

## 2011-01-07 NOTE — Assessment & Plan Note (Signed)
Stable. Continue current therapy. Patient is weighing daily as mentioned above.

## 2011-01-21 ENCOUNTER — Ambulatory Visit (INDEPENDENT_AMBULATORY_CARE_PROVIDER_SITE_OTHER): Payer: Medicare Other | Admitting: Endocrinology

## 2011-01-21 ENCOUNTER — Encounter: Payer: Self-pay | Admitting: Endocrinology

## 2011-01-21 ENCOUNTER — Other Ambulatory Visit (INDEPENDENT_AMBULATORY_CARE_PROVIDER_SITE_OTHER): Payer: Medicare Other

## 2011-01-21 DIAGNOSIS — R319 Hematuria, unspecified: Secondary | ICD-10-CM

## 2011-01-21 DIAGNOSIS — N259 Disorder resulting from impaired renal tubular function, unspecified: Secondary | ICD-10-CM

## 2011-01-21 DIAGNOSIS — I5032 Chronic diastolic (congestive) heart failure: Secondary | ICD-10-CM

## 2011-01-21 DIAGNOSIS — I509 Heart failure, unspecified: Secondary | ICD-10-CM

## 2011-01-21 DIAGNOSIS — E119 Type 2 diabetes mellitus without complications: Secondary | ICD-10-CM

## 2011-01-21 DIAGNOSIS — R0602 Shortness of breath: Secondary | ICD-10-CM

## 2011-01-21 LAB — BASIC METABOLIC PANEL
BUN: 38 mg/dL — ABNORMAL HIGH (ref 6–23)
Calcium: 8.7 mg/dL (ref 8.4–10.5)
Chloride: 97 mEq/L (ref 96–112)
Creatinine, Ser: 1.7 mg/dL — ABNORMAL HIGH (ref 0.4–1.2)
GFR: 38.29 mL/min — ABNORMAL LOW (ref >60.00)

## 2011-01-21 LAB — URINALYSIS, ROUTINE W REFLEX MICROSCOPIC
Bilirubin Urine: NEGATIVE
Hgb urine dipstick: NEGATIVE
Ketones, ur: NEGATIVE
Leukocytes, UA: NEGATIVE
Nitrite: NEGATIVE
pH: 5 (ref 5.0–8.0)

## 2011-01-21 LAB — BRAIN NATRIURETIC PEPTIDE: Pro B Natriuretic peptide (BNP): 336 pg/mL — ABNORMAL HIGH (ref 0.0–100.0)

## 2011-01-21 MED ORDER — ROSUVASTATIN CALCIUM 40 MG PO TABS: 40.0000 mg | ORAL_TABLET | Freq: Every day | ORAL | Status: DC

## 2011-01-21 NOTE — Patient Instructions (Addendum)
check your blood sugar 2 times a day.  vary the time of day when you check, between before the 3 meals, and at bedtime.  also check if you have symptoms of your blood sugar being too high or too low.  please keep a record of the readings and bring it to your next appointment here.  please call us sooner if you are having low blood sugar episodes. Here are some samples of crestor 20 mg.  Take 2 of these per day.  i have also resent the prescription to [facility], for 40 mg daily.   blood and urine tests are being ordered for you today.  please call [phone] to hear your test results.  You will be prompted to enter the 9-digit "MRN" number that appears at the top left of this page, followed by #.  Then you will hear the message. For now, increase metolozone to 10 mg 2x a day. Please come back here in 3-4 days.   (update: i left message on phone-tree:  Check cbg's!)

## 2011-01-21 NOTE — Progress Notes (Signed)
Subjective:    Patient ID: [name], female    DOB: June 30, 1929, 77 y.o.   MRN: [id number]  HPI Pt says "the pharmacy was supposed to send me some crestor, but didn't."  Pt states 1 day of slight swelling of the legs, and assoc slight sob. no cbg record, but states cbg's are well-controlled.  She says it was 62 this am, and 179 yesterday afternoon.   Past Medical History  Diagnosis Date  . Uterine cancer   . Diabetes mellitus     type II; peripheral neuropathy  . Hypertension   . GERD (gastroesophageal reflux disease)   . Peripheral vascular disease   . Obesity   . Dyslipidemia   . Diastolic CHF, chronic     EF 50-55%, mild LVH and grade 1 diast. Dysfxn  . Osteoarthritis cervical spine   . DM retinopathy   . Sleep apnea     with CPAP  . History of shingles   . COPD (chronic obstructive pulmonary disease)   . Depression   . Peptic ulcer disease     duodenal  . Peptic stricture of esophagus   . Hiatal hernia   . Diverticulosis   . Arthritis   . ASCVD (arteriosclerotic cardiovascular disease)     on MRI brin  . CAD (coronary artery disease)     a. s/p multiple caths with nonobs CAD;   b. cath 1/10: pLAD 20%, mLAD 40%, pCFX 20%, mCFX 40%, pRCA 60-70%  . Non-ischemic cardiomyopathy   . Asthma     Mild  . Pruritic condition     Idiopathic  . Hyperlipidemia   . Zoster   . Noncompliance     Past Surgical History  Procedure Date  . Tubal ligation 1967  . Knee arthroscopy 10/1998    Left  . Craniotomy 1997    Left for SDH  . Cataract extraction, bilateral 2005  . Hernia repair   . Esophagogastroduodenoscopy 04/04/2004  . Spine surgery     C-spine and lumbar surgery  . Cholecystectomy 2010  . Cardiac catheterization   . Dexa 7/05  . Abdominal hysterectomy     History   Social History  . Marital Status: Married    Spouse Name: N/A    Number of Children: 7  . Years of Education: N/A   Occupational History  . Retired    Social History Main  Topics  . Smoking status: Never Smoker   . Smokeless tobacco: Not on file  . Alcohol Use: Yes     rare  . Drug Use: No  . Sexually Active:    Other Topics Concern  . Not on file   Social History Narrative  . No narrative on file    Current Outpatient Prescriptions on File Prior to Visit  Medication Sig Dispense Refill  . ADVAIR DISKUS 100-50 MCG/DOSE AEPB USE ONE (1) PUFF(S) TWICE DAILY  60 each  2  . aspirin 325 MG tablet Take 325 mg by mouth daily.        Marland Kitchen ezetimibe (ZETIA) 10 MG tablet Take 10 mg by mouth daily.        . furosemide (LASIX) 80 MG tablet 80 mg. Take 1 1/2 tabs twice a day.      Marland Kitchen glucose blood (FREESTYLE TEST STRIPS) test strip 2x a day, dx 250.01      . hydrALAZINE (APRESOLINE) 25 MG tablet TAKE ONE (1) TABLET(S) THREE (3) TIMES DAILY  90 tablet  5  . HYDROcodone-acetaminophen (NORCO)  10-325 MG per tablet Take 0.5-1 tablets by mouth every 4 (four) hours as needed.        . insulin NPH (HUMULIN N) 100 UNIT/ML injection Inject 220 Units into the skin daily before breakfast.       . isosorbide dinitrate (ISORDIL) 30 MG tablet Take 30 mg by mouth 2 (two) times daily.        Marland Kitchen labetalol (NORMODYNE) 200 MG tablet Take 200 mg by mouth 2 (two) times daily.        . metolazone (ZAROXOLYN) 5 MG tablet Take 5 mg by mouth 2 (two) times daily.        . nitroGLYCERIN (NITROSTAT) 0.4 MG SL tablet Place 0.4 mg under the tongue every 5 (five) minutes as needed. MAX 3 doses       . NON FORMULARY CPAP Machine: once daily.       . ondansetron (ZOFRAN) 4 MG tablet Take 4 mg by mouth every 6 (six) hours as needed.        . potassium chloride SA (KLOR-CON M20) 20 MEQ tablet Take four tablets daily      . traMADol (ULTRAM) 50 MG tablet Take 50 mg by mouth every 4 (four) hours as needed.        . VENTOLIN HFA 108 (90 BASE) MCG/ACT inhaler INHALE 2 PUFFS INTO THE LUNGS EVERY 6 (SIX) HOURS AS NEEDED FOR WHEEZING  18 g  0    Allergies  Allergen Reactions  . Amlodipine Besylate   .  Iohexol      Code: RASH, Desc: JENNIFER STATES ON PT'S CHART ALLERGIC TO IV DYE 09/04/07/RM, Onset Date: 16109604     Family History  Problem Relation Age of Onset  . Cancer Mother     "Stomach" Cancer  . Diabetes Mother   . Heart disease Mother   . Stomach cancer Mother   . Lymphoma Father   . Kidney disease Paternal Grandmother   . Asthma Other     BP 144/76  Pulse 76  Temp(Src) 98.1 F (36.7 C) (Oral)  Ht 5\' 6"  (1.676 m)  Wt 226 lb 12.8 oz (102.876 kg)  BMI 36.61 kg/m2  SpO2 95%     Review of Systems  Constitutional: Negative for fever.  HENT: Positive for facial swelling.   Eyes: Negative for visual disturbance.  Respiratory: Negative for cough.   Cardiovascular:       No change in chronic chest pain  Gastrointestinal:       No change in chronic abd crapms  Genitourinary: Positive for dysuria.  Musculoskeletal: Negative for joint swelling.  Skin: Negative for rash.  Neurological: Negative for syncope.  Hematological: Bruises/bleeds easily.  Psychiatric/Behavioral: Negative for dysphoric mood.   Hematuria has recurred.  She has chronic urinary incontinence.  She has weight gain.    Objective:   Physical Exam GENERAL: no distress.  Obese LUNGS:  Clear to auscultation HEART: Regular rate and rhythm without murmurs noted. Normal S1,S2.   Ext: 1+ bilat leg edema  Lab Results  Component Value Date   HGBA1C 10.5* 01/03/2011  elev bun and creat are noted ua is neg Fructosamine= 339 (converts to a1c of 7.5)   Assessment & Plan:  Chf, worse Dm.  this is the best control this pt should aim for, given this regimen, which does match insulin to her changing needs throughout the day Dyslipidemia, therapy limited by noncompliance.  i'll do the best i can. Renal insuff, stable.  She can prob tolerate an  increase in her diuretic rx. Hematuria by pt's report  Not seen on ua

## 2011-01-22 ENCOUNTER — Other Ambulatory Visit: Payer: Self-pay | Admitting: Family Medicine

## 2011-01-22 LAB — FRUCTOSAMINE: Fructosamine: 339 umol/L — ABNORMAL HIGH (ref <285)

## 2011-01-23 LAB — URINE CULTURE: Colony Count: NO GROWTH

## 2011-01-25 ENCOUNTER — Encounter: Payer: Self-pay | Admitting: Endocrinology

## 2011-01-25 ENCOUNTER — Other Ambulatory Visit (INDEPENDENT_AMBULATORY_CARE_PROVIDER_SITE_OTHER): Payer: Medicare Other

## 2011-01-25 ENCOUNTER — Ambulatory Visit (INDEPENDENT_AMBULATORY_CARE_PROVIDER_SITE_OTHER): Payer: Medicare Other | Admitting: Endocrinology

## 2011-01-25 VITALS — BP 152/82 | HR 66 | Temp 98.1°F | Ht 66.0 in | Wt 226.0 lb

## 2011-01-25 DIAGNOSIS — I5032 Chronic diastolic (congestive) heart failure: Secondary | ICD-10-CM

## 2011-01-25 DIAGNOSIS — I509 Heart failure, unspecified: Secondary | ICD-10-CM

## 2011-01-25 DIAGNOSIS — R0609 Other forms of dyspnea: Secondary | ICD-10-CM

## 2011-01-25 DIAGNOSIS — R0989 Other specified symptoms and signs involving the circulatory and respiratory systems: Secondary | ICD-10-CM

## 2011-01-25 LAB — BASIC METABOLIC PANEL
BUN: 43 mg/dL — ABNORMAL HIGH (ref 6–23)
CO2: 32 mEq/L (ref 19–32)
GFR: 36.51 mL/min — ABNORMAL LOW (ref >60.00)
Glucose, Bld: 237 mg/dL — ABNORMAL HIGH (ref 70–99)
Potassium: 4.5 mEq/L (ref 3.5–5.1)

## 2011-01-25 NOTE — Progress Notes (Signed)
Subjective:    Patient ID: [name], female    DOB: 01-03-1930, 77 y.o.   MRN: [id number]  HPI On the increased diuretic rx, pt says no improvement in her edema or doe.   However, she gets good diuretic effect from the increased dosage of metolozone.   she brings a record of her cbg's which i have reviewed today.  It varies from 53-411.  However, most are in the 100's.  There is no trend throughout the day. Past Medical History  Diagnosis Date  . Uterine cancer   . Diabetes mellitus     type II; peripheral neuropathy  . Hypertension   . GERD (gastroesophageal reflux disease)   . Peripheral vascular disease   . Obesity   . Dyslipidemia   . Diastolic CHF, chronic     EF 50-55%, mild LVH and grade 1 diast. Dysfxn  . Osteoarthritis cervical spine   . DM retinopathy   . Sleep apnea     with CPAP  . History of shingles   . COPD (chronic obstructive pulmonary disease)   . Depression   . Peptic ulcer disease     duodenal  . Peptic stricture of esophagus   . Hiatal hernia   . Diverticulosis   . Arthritis   . ASCVD (arteriosclerotic cardiovascular disease)     on MRI brin  . CAD (coronary artery disease)     a. s/p multiple caths with nonobs CAD;   b. cath 1/10: pLAD 20%, mLAD 40%, pCFX 20%, mCFX 40%, pRCA 60-70%  . Non-ischemic cardiomyopathy   . Asthma     Mild  . Pruritic condition     Idiopathic  . Hyperlipidemia   . Zoster   . Noncompliance     Past Surgical History  Procedure Date  . Tubal ligation 1967  . Knee arthroscopy 10/1998    Left  . Craniotomy 1997    Left for SDH  . Cataract extraction, bilateral 2005  . Hernia repair   . Esophagogastroduodenoscopy 04/04/2004  . Spine surgery     C-spine and lumbar surgery  . Cholecystectomy 2010  . Cardiac catheterization   . Dexa 7/05  . Abdominal hysterectomy     History   Social History  . Marital Status: Married    Spouse Name: N/A    Number of Children: 7  . Years of Education: N/A    Occupational History  . Retired    Social History Main Topics  . Smoking status: Never Smoker   . Smokeless tobacco: Not on file  . Alcohol Use: Yes     rare  . Drug Use: No  . Sexually Active:    Other Topics Concern  . Not on file   Social History Narrative  . No narrative on file    Current Outpatient Prescriptions on File Prior to Visit  Medication Sig Dispense Refill  . ADVAIR DISKUS 100-50 MCG/DOSE AEPB USE ONE (1) PUFF(S) TWICE DAILY  60 each  2  . aspirin 325 MG tablet Take 325 mg by mouth daily.        Marland Kitchen ezetimibe (ZETIA) 10 MG tablet Take 10 mg by mouth daily.        . furosemide (LASIX) 80 MG tablet 80 mg. Take 1 1/2 tabs twice a day.      Marland Kitchen glucose blood (FREESTYLE TEST STRIPS) test strip 2x a day, dx 250.01      . hydrALAZINE (APRESOLINE) 25 MG tablet TAKE ONE (1) TABLET(S) THREE (  3) TIMES DAILY  90 tablet  5  . HYDROcodone-acetaminophen (NORCO) 10-325 MG per tablet Take 0.5-1 tablets by mouth every 4 (four) hours as needed.        . insulin NPH (HUMULIN N) 100 UNIT/ML injection Inject 220 Units into the skin daily before breakfast.       . isosorbide dinitrate (ISORDIL) 30 MG tablet Take 30 mg by mouth 2 (two) times daily.        Marland Kitchen labetalol (NORMODYNE) 200 MG tablet Take 200 mg by mouth 2 (two) times daily.        . metolazone (ZAROXOLYN) 5 MG tablet Take 5 mg by mouth 2 (two) times daily.        . nitroGLYCERIN (NITROSTAT) 0.4 MG SL tablet Place 0.4 mg under the tongue every 5 (five) minutes as needed. MAX 3 doses       . NON FORMULARY CPAP Machine: once daily.       . ondansetron (ZOFRAN) 4 MG tablet Take 4 mg by mouth every 6 (six) hours as needed.        . potassium chloride SA (KLOR-CON M20) 20 MEQ tablet Take four tablets daily      . rosuvastatin (CRESTOR) 40 MG tablet Take 1 tablet (40 mg total) by mouth daily.  30 tablet  11  . traMADol (ULTRAM) 50 MG tablet Take 50 mg by mouth every 4 (four) hours as needed.        . VENTOLIN HFA 108 (90 BASE) MCG/ACT  inhaler INHALE 2 PUFFS INTO THE LUNGS EVERY 6 (SIX) HOURS AS NEEDED FOR WHEEZING  18 g  0    Allergies  Allergen Reactions  . Amlodipine Besylate   . Iohexol      Code: RASH, Desc: JENNIFER STATES ON PT'S CHART ALLERGIC TO IV DYE 09/04/07/RM, Onset Date: 16109604     Family History  Problem Relation Age of Onset  . Cancer Mother     "Stomach" Cancer  . Diabetes Mother   . Heart disease Mother   . Stomach cancer Mother   . Lymphoma Father   . Kidney disease Paternal Grandmother   . Asthma Other     BP 152/82  Pulse 66  Temp(Src) 98.1 F (36.7 C) (Oral)  Ht 5\' 6"  (1.676 m)  Wt 226 lb (102.513 kg)  BMI 36.48 kg/m2  SpO2 97%   Review of Systems Denies chest pain and loc    Objective:   Physical Exam GENERAL: no distress LUNGS:  Clear to auscultation Ext; 1+ bilat leg edema   i noted bnp and bmet    Assessment & Plan:  Dm.  Improved compliance with cbg's.   Htn, with ? If situational component. Renal insuff, unchanged with increased diuretic rx Chf, unchanged

## 2011-01-25 NOTE — Patient Instructions (Addendum)
Please continue to check your blood sugar 2 times a day.  vary the time of day when you check, between before the 3 meals, and at bedtime.  also check if you have symptoms of your blood sugar being too high or too low.  please keep a record of the readings and bring it to your next appointment here.  please call us sooner if you are having low blood sugar episodes. blood tests, and a chest x-ray, are being ordered for you today.  please call [phone] to hear your test results.  You will be prompted to enter the 9-digit "MRN" number that appears at the top left of this page, followed by #.  Then you will hear the message.   Please make a follow-up appointment in 2-3 weeks. pending the test results, please continue the same medications for now. (update: i left message on phone-tree:  Same rx)

## 2011-02-25 ENCOUNTER — Telehealth: Payer: Self-pay | Admitting: Cardiology

## 2011-02-25 NOTE — Telephone Encounter (Signed)
Called Dr.Lofton's office concerning this patient. She needs a surgical removal of a tooth and has periodontal disease. I advised that from a cardiac standpoint she does not need SBE but they are going to treat her anyway because of her extensive gum disease.

## 2011-02-25 NOTE — Telephone Encounter (Signed)
Dr. Alanda Slim office calling needing medical clearance for pt to have dental surgery tomorrow morning. Please call back to discuss.

## 2011-04-02 LAB — COMPREHENSIVE METABOLIC PANEL
ALT: 19
AST: 22
Albumin: 3.5
Alkaline Phosphatase: 67
Glucose, Bld: 177 — ABNORMAL HIGH
Potassium: 4.2
Sodium: 140
Total Protein: 6.4

## 2011-04-02 LAB — BASIC METABOLIC PANEL
BUN: 33 — ABNORMAL HIGH
CO2: 28
Calcium: 8.4
Creatinine, Ser: 1.3 — ABNORMAL HIGH
GFR calc Af Amer: 48 — ABNORMAL LOW

## 2011-04-02 LAB — POCT CARDIAC MARKERS
CKMB, poc: 3.6
Myoglobin, poc: 235
Operator id: 198171
Troponin i, poc: <0.05
Troponin i, poc: <0.05

## 2011-04-02 LAB — CARDIAC PANEL(CRET KIN+CKTOT+MB+TROPI)
Relative Index: 2.3
Total CK: 254 — ABNORMAL HIGH
Total CK: 258 — ABNORMAL HIGH
Troponin I: 0.06

## 2011-04-02 LAB — I-STAT 8, (EC8 V) (CONVERTED LAB)
Acid-base deficit: 1
Potassium: 4
Sodium: 141
TCO2: 27
pH, Ven: 7.335 — ABNORMAL HIGH

## 2011-04-02 LAB — DIFFERENTIAL
Basophils Relative: 1
Eosinophils Absolute: 0
Eosinophils Relative: 0
Monocytes Absolute: 0.6
Monocytes Relative: 7

## 2011-04-02 LAB — CBC
Hemoglobin: 13.1
MCHC: 33.4
RBC: 4.58

## 2011-04-02 LAB — CLOSTRIDIUM DIFFICILE EIA: C difficile Toxins A+B, EIA: NEGATIVE

## 2011-04-02 LAB — STOOL CULTURE

## 2011-04-02 LAB — PROTIME-INR
INR: 1
Prothrombin Time: 13.4

## 2011-04-02 LAB — CK TOTAL AND CKMB (NOT AT ARMC)
CK, MB: 5 — ABNORMAL HIGH
Relative Index: 2.4

## 2011-04-02 LAB — FECAL LACTOFERRIN, QUANT

## 2011-04-02 LAB — LIPID PANEL
HDL: 47
LDL Cholesterol: 69
Total CHOL/HDL Ratio: 3
VLDL: 27

## 2011-04-02 LAB — POCT I-STAT CREATININE
Creatinine, Ser: 1.9 — ABNORMAL HIGH
Operator id: 279831

## 2011-04-04 ENCOUNTER — Ambulatory Visit: Payer: Medicare Other | Admitting: Endocrinology

## 2011-04-05 ENCOUNTER — Emergency Department (HOSPITAL_COMMUNITY): Payer: Medicare Other

## 2011-04-05 ENCOUNTER — Inpatient Hospital Stay (HOSPITAL_COMMUNITY)
Admission: EM | Admit: 2011-04-05 | Discharge: 2011-04-08 | DRG: 293 | Disposition: A | Discharge status: Home / Self Care | Payer: Medicare Other | Attending: Cardiology | Admitting: Cardiology

## 2011-04-05 ENCOUNTER — Ambulatory Visit (INDEPENDENT_AMBULATORY_CARE_PROVIDER_SITE_OTHER): Payer: Medicare Other | Admitting: Endocrinology

## 2011-04-05 ENCOUNTER — Encounter: Payer: Self-pay | Admitting: Endocrinology

## 2011-04-05 DIAGNOSIS — M1990 Unspecified osteoarthritis, unspecified site: Secondary | ICD-10-CM | POA: Diagnosis present

## 2011-04-05 DIAGNOSIS — I2510 Atherosclerotic heart disease of native coronary artery without angina pectoris: Secondary | ICD-10-CM

## 2011-04-05 DIAGNOSIS — Z794 Long term (current) use of insulin: Secondary | ICD-10-CM

## 2011-04-05 DIAGNOSIS — E785 Hyperlipidemia, unspecified: Secondary | ICD-10-CM | POA: Diagnosis present

## 2011-04-05 DIAGNOSIS — R079 Chest pain, unspecified: Secondary | ICD-10-CM

## 2011-04-05 DIAGNOSIS — K219 Gastro-esophageal reflux disease without esophagitis: Secondary | ICD-10-CM | POA: Diagnosis present

## 2011-04-05 DIAGNOSIS — E11319 Type 2 diabetes mellitus with unspecified diabetic retinopathy without macular edema: Secondary | ICD-10-CM | POA: Diagnosis present

## 2011-04-05 DIAGNOSIS — E1149 Type 2 diabetes mellitus with other diabetic neurological complication: Secondary | ICD-10-CM | POA: Diagnosis present

## 2011-04-05 DIAGNOSIS — I509 Heart failure, unspecified: Secondary | ICD-10-CM | POA: Diagnosis present

## 2011-04-05 DIAGNOSIS — Z9849 Cataract extraction status, unspecified eye: Secondary | ICD-10-CM

## 2011-04-05 DIAGNOSIS — R0602 Shortness of breath: Secondary | ICD-10-CM

## 2011-04-05 DIAGNOSIS — I739 Peripheral vascular disease, unspecified: Secondary | ICD-10-CM | POA: Diagnosis present

## 2011-04-05 DIAGNOSIS — E1139 Type 2 diabetes mellitus with other diabetic ophthalmic complication: Secondary | ICD-10-CM | POA: Diagnosis present

## 2011-04-05 DIAGNOSIS — J4489 Other specified chronic obstructive pulmonary disease: Secondary | ICD-10-CM | POA: Diagnosis present

## 2011-04-05 DIAGNOSIS — G4733 Obstructive sleep apnea (adult) (pediatric): Secondary | ICD-10-CM | POA: Diagnosis present

## 2011-04-05 DIAGNOSIS — Z79899 Other long term (current) drug therapy: Secondary | ICD-10-CM

## 2011-04-05 DIAGNOSIS — E1142 Type 2 diabetes mellitus with diabetic polyneuropathy: Secondary | ICD-10-CM | POA: Diagnosis present

## 2011-04-05 DIAGNOSIS — I5033 Acute on chronic diastolic (congestive) heart failure: Principal | ICD-10-CM | POA: Diagnosis present

## 2011-04-05 DIAGNOSIS — Z7982 Long term (current) use of aspirin: Secondary | ICD-10-CM

## 2011-04-05 DIAGNOSIS — I10 Essential (primary) hypertension: Secondary | ICD-10-CM | POA: Diagnosis present

## 2011-04-05 DIAGNOSIS — J449 Chronic obstructive pulmonary disease, unspecified: Secondary | ICD-10-CM | POA: Diagnosis present

## 2011-04-05 DIAGNOSIS — F3289 Other specified depressive episodes: Secondary | ICD-10-CM | POA: Diagnosis present

## 2011-04-05 DIAGNOSIS — F329 Major depressive disorder, single episode, unspecified: Secondary | ICD-10-CM | POA: Diagnosis present

## 2011-04-05 LAB — CBC
MCH: 29.3 pg (ref 26.0–34.0)
MCHC: 33.1 g/dL (ref 30.0–36.0)
MCV: 88.6 fL (ref 78.0–100.0)
Platelets: 239 10*3/uL (ref 150–400)
RBC: 4.84 MIL/uL (ref 3.87–5.11)
RDW: 13.3 % (ref 11.5–15.5)

## 2011-04-05 LAB — CK TOTAL AND CKMB (NOT AT ARMC)
CK, MB: 10.1 ng/mL (ref 0.3–4.0)
Relative Index: 1.6 (ref 0.0–2.5)
Relative Index: 1.6 (ref 0.0–2.5)

## 2011-04-05 LAB — GLUCOSE, CAPILLARY: Glucose-Capillary: 111 mg/dL — ABNORMAL HIGH (ref 70–99)

## 2011-04-05 LAB — HEPARIN LEVEL (UNFRACTIONATED): Heparin Unfractionated: 0.76 IU/mL — ABNORMAL HIGH (ref 0.30–0.70)

## 2011-04-05 LAB — BASIC METABOLIC PANEL
BUN: 32 mg/dL — ABNORMAL HIGH (ref 6–23)
CO2: 30 mEq/L (ref 19–32)
Calcium: 9.2 mg/dL (ref 8.4–10.5)
Calcium: 9.3 mg/dL (ref 8.4–10.5)
Creatinine, Ser: 1.47 mg/dL — ABNORMAL HIGH (ref 0.50–1.10)
Creatinine, Ser: 1.41 mg/dL — ABNORMAL HIGH (ref 0.50–1.10)
GFR calc Af Amer: 40 mL/min — ABNORMAL LOW (ref >90)
GFR calc non Af Amer: 34 mL/min — ABNORMAL LOW (ref >90)
Glucose, Bld: 77 mg/dL (ref 70–99)
Sodium: 141 mEq/L (ref 135–145)

## 2011-04-05 LAB — DIFFERENTIAL
Basophils Relative: 0 % (ref 0–1)
Eosinophils Absolute: 0.2 10*3/uL (ref 0.0–0.7)
Eosinophils Relative: 1 % (ref 0–5)
Lymphs Abs: 1.9 10*3/uL (ref 0.7–4.0)
Monocytes Absolute: 0.6 10*3/uL (ref 0.1–1.0)
Monocytes Relative: 5 % (ref 3–12)
Neutrophils Relative %: 76 % (ref 43–77)

## 2011-04-05 LAB — CARDIAC PANEL(CRET KIN+CKTOT+MB+TROPI)
CK, MB: 8.9 ng/mL (ref 0.3–4.0)
Total CK: 578 U/L — ABNORMAL HIGH (ref 7–177)

## 2011-04-05 LAB — PRO B NATRIURETIC PEPTIDE: Pro B Natriuretic peptide (BNP): 739.6 pg/mL — ABNORMAL HIGH (ref 0–450)

## 2011-04-05 LAB — POCT I-STAT TROPONIN I: Troponin i, poc: 0.16 ng/mL (ref 0.00–0.08)

## 2011-04-05 NOTE — Progress Notes (Signed)
Subjective:    Patient ID: [name], female    DOB: Jul 06, 1929, 77 y.o.   MRN: [id number]  HPI Pt states a few days of constant moderate pain across the chest, rad to the right arm.  She has the pain now.  She last took her asa this am.   Past Medical History  Diagnosis Date  . Uterine cancer   . Diabetes mellitus     type II; peripheral neuropathy  . Hypertension   . GERD (gastroesophageal reflux disease)   . Peripheral vascular disease   . Obesity   . Dyslipidemia   . Diastolic CHF, chronic     EF 50-55%, mild LVH and grade 1 diast. Dysfxn  . Osteoarthritis cervical spine   . DM retinopathy   . Sleep apnea     with CPAP  . History of shingles   . COPD (chronic obstructive pulmonary disease)   . Depression   . Peptic ulcer disease     duodenal  . Peptic stricture of esophagus   . Hiatal hernia   . Diverticulosis   . Arthritis   . ASCVD (arteriosclerotic cardiovascular disease)     on MRI brin  . CAD (coronary artery disease)     a. s/p multiple caths with nonobs CAD;   b. cath 1/10: pLAD 20%, mLAD 40%, pCFX 20%, mCFX 40%, pRCA 60-70%  . Non-ischemic cardiomyopathy   . Asthma     Mild  . Pruritic condition     Idiopathic  . Hyperlipidemia   . Zoster   . Noncompliance     Past Surgical History  Procedure Date  . Tubal ligation 1967  . Knee arthroscopy 10/1998    Left  . Craniotomy 1997    Left for SDH  . Cataract extraction, bilateral 2005  . Hernia repair   . Esophagogastroduodenoscopy 04/04/2004  . Spine surgery     C-spine and lumbar surgery  . Cholecystectomy 2010  . Cardiac catheterization   . Dexa 7/05  . Abdominal hysterectomy     History   Social History  . Marital Status: Married    Spouse Name: N/A    Number of Children: 7  . Years of Education: N/A   Occupational History  . Retired    Social History Main Topics  . Smoking status: Never Smoker   . Smokeless tobacco: Not on file  . Alcohol Use: Yes     rare  . Drug  Use: No  . Sexually Active:    Other Topics Concern  . Not on file   Social History Narrative  . No narrative on file    Current Outpatient Prescriptions on File Prior to Visit  Medication Sig Dispense Refill  . ADVAIR DISKUS 100-50 MCG/DOSE AEPB USE ONE (1) PUFF(S) TWICE DAILY  60 each  2  . aspirin 325 MG tablet Take 325 mg by mouth daily.        Marland Kitchen ezetimibe (ZETIA) 10 MG tablet Take 10 mg by mouth daily.        . furosemide (LASIX) 80 MG tablet 80 mg. Take 1 1/2 tabs twice a day.      Marland Kitchen glucose blood (FREESTYLE TEST STRIPS) test strip 2x a day, dx 250.01      . hydrALAZINE (APRESOLINE) 25 MG tablet TAKE ONE (1) TABLET(S) THREE (3) TIMES DAILY  90 tablet  5  . HYDROcodone-acetaminophen (NORCO) 10-325 MG per tablet Take 0.5-1 tablets by mouth every 4 (four) hours as needed.        Marland Kitchen  insulin NPH (HUMULIN N) 100 UNIT/ML injection Inject 220 Units into the skin daily before breakfast.       . isosorbide dinitrate (ISORDIL) 30 MG tablet Take 30 mg by mouth 2 (two) times daily.        Marland Kitchen labetalol (NORMODYNE) 200 MG tablet Take 200 mg by mouth 2 (two) times daily.        . metolazone (ZAROXOLYN) 5 MG tablet Take 5 mg by mouth 2 (two) times daily.        . nitroGLYCERIN (NITROSTAT) 0.4 MG SL tablet Place 0.4 mg under the tongue every 5 (five) minutes as needed. MAX 3 doses       . NON FORMULARY CPAP Machine: once daily.       . ondansetron (ZOFRAN) 4 MG tablet Take 4 mg by mouth every 6 (six) hours as needed.        . potassium chloride SA (KLOR-CON M20) 20 MEQ tablet Take four tablets daily      . rosuvastatin (CRESTOR) 40 MG tablet Take 1 tablet (40 mg total) by mouth daily.  30 tablet  11  . traMADol (ULTRAM) 50 MG tablet Take 50 mg by mouth every 4 (four) hours as needed.        . VENTOLIN HFA 108 (90 BASE) MCG/ACT inhaler INHALE 2 PUFFS INTO THE LUNGS EVERY 6 (SIX) HOURS AS NEEDED FOR WHEEZING  18 g  0    Allergies  Allergen Reactions  . Amlodipine Besylate   . Iohexol      Code:  RASH, Desc: JENNIFER STATES ON PT'S CHART ALLERGIC TO IV DYE 09/04/07/RM, Onset Date: 16109604     Family History  Problem Relation Age of Onset  . Cancer Mother     "Stomach" Cancer  . Diabetes Mother   . Heart disease Mother   . Stomach cancer Mother   . Lymphoma Father   . Kidney disease Paternal Grandmother   . Asthma Other     BP 132/60  Pulse 77  Temp(Src) 97.8 F (36.6 C) (Oral)  Ht 5\' 6"  (1.676 m)  Wt 225 lb 8 oz (102.286 kg)  BMI 36.40 kg/m2  SpO2 99%    Review of Systems Denies loc    Objective:   Physical Exam VITAL SIGNS:  See vs page GENERAL: no distress.  Obese chest wall: nontender LUNGS:  Clear to auscultation HEART:  Regular rate and rhythm without murmurs noted. Normal S1,S2.      i reviewed electrocardiogram--new changes compared to 01/07/11    Assessment & Plan:  Chest pain, with ecg changes since last ov with dr wall 3 mos ago.  High risk of unstable angina.

## 2011-04-05 NOTE — Patient Instructions (Addendum)
Staff is advised to call [phone] now. i paged [place] [facility]. Staff advised to please give [id number] now.

## 2011-04-06 LAB — GLUCOSE, CAPILLARY
Glucose-Capillary: 108 mg/dL — ABNORMAL HIGH (ref 70–99)
Glucose-Capillary: 239 mg/dL — ABNORMAL HIGH (ref 70–99)
Glucose-Capillary: 143 mg/dL — ABNORMAL HIGH (ref 70–99)
Glucose-Capillary: 62 mg/dL — ABNORMAL LOW (ref 70–99)

## 2011-04-06 LAB — CBC
Hemoglobin: 14.3 g/dL (ref 12.0–15.0)
MCH: 29.7 pg (ref 26.0–34.0)
MCV: 89.4 fL (ref 78.0–100.0)
RBC: 4.81 MIL/uL (ref 3.87–5.11)
WBC: 9.1 10*3/uL (ref 4.0–10.5)

## 2011-04-06 LAB — BASIC METABOLIC PANEL
BUN: 31 mg/dL — ABNORMAL HIGH (ref 6–23)
CO2: 29 mEq/L (ref 19–32)
Chloride: 99 mEq/L (ref 96–112)
Creatinine, Ser: 1.44 mg/dL — ABNORMAL HIGH (ref 0.50–1.10)
Potassium: 3.1 mEq/L — ABNORMAL LOW (ref 3.5–5.1)

## 2011-04-06 LAB — CARDIAC PANEL(CRET KIN+CKTOT+MB+TROPI)
CK, MB: 6.7 ng/mL (ref 0.3–4.0)
Relative Index: 1.7 (ref 0.0–2.5)
Total CK: 450 U/L — ABNORMAL HIGH (ref 7–177)
Troponin I: <0.3 ng/mL (ref <0.30)
Troponin I: <0.3 ng/mL (ref <0.30)

## 2011-04-06 LAB — MRSA PCR SCREENING: MRSA by PCR: NEGATIVE

## 2011-04-07 DIAGNOSIS — I5033 Acute on chronic diastolic (congestive) heart failure: Secondary | ICD-10-CM

## 2011-04-07 LAB — GLUCOSE, CAPILLARY
Glucose-Capillary: 144 mg/dL — ABNORMAL HIGH (ref 70–99)
Glucose-Capillary: 115 mg/dL — ABNORMAL HIGH (ref 70–99)

## 2011-04-07 LAB — BASIC METABOLIC PANEL
BUN: 43 mg/dL — ABNORMAL HIGH (ref 6–23)
CO2: 31 mEq/L (ref 19–32)
Calcium: 9.6 mg/dL (ref 8.4–10.5)
Chloride: 93 mEq/L — ABNORMAL LOW (ref 96–112)
Creatinine, Ser: 1.77 mg/dL — ABNORMAL HIGH (ref 0.50–1.10)
Glucose, Bld: 87 mg/dL (ref 70–99)

## 2011-04-07 LAB — CBC
HCT: 46.2 % — ABNORMAL HIGH (ref 36.0–46.0)
MCH: 28.6 pg (ref 26.0–34.0)
MCV: 89.4 fL (ref 78.0–100.0)
Platelets: 262 10*3/uL (ref 150–400)
RBC: 5.17 MIL/uL — ABNORMAL HIGH (ref 3.87–5.11)
WBC: 7.4 10*3/uL (ref 4.0–10.5)

## 2011-04-08 LAB — BASIC METABOLIC PANEL
CO2: 28 mEq/L (ref 19–32)
Calcium: 9.4 mg/dL (ref 8.4–10.5)
Chloride: 100 mEq/L (ref 96–112)
Glucose, Bld: 172 mg/dL — ABNORMAL HIGH (ref 70–99)
Potassium: 4.1 mEq/L (ref 3.5–5.1)
Sodium: 141 mEq/L (ref 135–145)

## 2011-04-08 LAB — CBC
Hemoglobin: 15.3 g/dL — ABNORMAL HIGH (ref 12.0–15.0)
MCH: 30.2 pg (ref 26.0–34.0)
Platelets: 246 10*3/uL (ref 150–400)
RBC: 5.06 MIL/uL (ref 3.87–5.11)
WBC: 9.1 10*3/uL (ref 4.0–10.5)

## 2011-04-08 LAB — GLUCOSE, CAPILLARY
Glucose-Capillary: 135 mg/dL — ABNORMAL HIGH (ref 70–99)
Glucose-Capillary: 147 mg/dL — ABNORMAL HIGH (ref 70–99)
Glucose-Capillary: 126 mg/dL — ABNORMAL HIGH (ref 70–99)

## 2011-04-10 ENCOUNTER — Telehealth: Payer: Self-pay | Admitting: Cardiology

## 2011-04-10 NOTE — Telephone Encounter (Signed)
Home health called They want order for home health aid please call back

## 2011-04-10 NOTE — Telephone Encounter (Signed)
Called [name] with [facility] and informed Dr will address when in office on Friday and I will forward to his nurse D Wallace Cullens. [name] was ok with plan.

## 2011-04-11 NOTE — Telephone Encounter (Signed)
I spoke with [facility], she says pt asked about having someone help her with her bath for the next couple of weeks b/c she is feeling weak.  The nurse will be out again tomorrow. They will reasses the need for an aid. If needed they will provide an aide to help pt and call back tomorrow.  I will forward information to Dr. Daleen Squibb. Mylo Red RN

## 2011-04-12 ENCOUNTER — Telehealth: Payer: Self-pay | Admitting: Cardiology

## 2011-04-12 MED ORDER — METOLAZONE 5 MG PO TABS: 5.0000 mg | ORAL_TABLET | Freq: Every day | ORAL | Status: DC | PRN

## 2011-04-12 MED ORDER — ISOSORBIDE DINITRATE 30 MG PO TABS: 30.0000 mg | ORAL_TABLET | Freq: Three times a day (TID) | ORAL | Status: DC

## 2011-04-12 NOTE — Telephone Encounter (Signed)
1. Stephanic,[facility], has reviewed medications with pt and she does not have prescriptions for Metolazone or Isordil. 2.  Also wanted know if Dr. Daleen Squibb could change her Crestor to another statin due to expense.       I do not know if pt is in [place].  She will talk with Dr. Daleen Squibb at appt on 04/17/11 3.  Judeth Cornfield also conveyed that pt does not need a HH aide at this time. Her husband is able to help her. Refills called in. Judeth Cornfield will call pt and let her know. Mylo Red RN

## 2011-04-12 NOTE — Telephone Encounter (Signed)
Judeth Cornfield from [facility] calling in regards to pt medications, wanting to know what pt should be taking. Please return call to discuss further.

## 2011-04-17 ENCOUNTER — Telehealth: Payer: Self-pay | Admitting: *Deleted

## 2011-04-17 ENCOUNTER — Ambulatory Visit (INDEPENDENT_AMBULATORY_CARE_PROVIDER_SITE_OTHER): Payer: Medicare Other | Admitting: Cardiology

## 2011-04-17 ENCOUNTER — Encounter: Payer: Self-pay | Admitting: Cardiology

## 2011-04-17 VITALS — BP 114/60 | HR 58 | Ht 66.0 in | Wt 226.0 lb

## 2011-04-17 DIAGNOSIS — I2510 Atherosclerotic heart disease of native coronary artery without angina pectoris: Secondary | ICD-10-CM

## 2011-04-17 DIAGNOSIS — R609 Edema, unspecified: Secondary | ICD-10-CM

## 2011-04-17 DIAGNOSIS — I739 Peripheral vascular disease, unspecified: Secondary | ICD-10-CM

## 2011-04-17 DIAGNOSIS — I10 Essential (primary) hypertension: Secondary | ICD-10-CM

## 2011-04-17 DIAGNOSIS — E876 Hypokalemia: Secondary | ICD-10-CM

## 2011-04-17 DIAGNOSIS — I509 Heart failure, unspecified: Secondary | ICD-10-CM

## 2011-04-17 DIAGNOSIS — I5032 Chronic diastolic (congestive) heart failure: Secondary | ICD-10-CM

## 2011-04-17 LAB — BASIC METABOLIC PANEL
CO2: 26 mEq/L (ref 19–32)
Calcium: 8.9 mg/dL (ref 8.4–10.5)
Glucose, Bld: 160 mg/dL — ABNORMAL HIGH (ref 70–99)
Potassium: 4 mEq/L (ref 3.5–5.1)
Sodium: 140 mEq/L (ref 135–145)

## 2011-04-17 MED ORDER — FUROSEMIDE 80 MG PO TABS: 160.0000 mg | ORAL_TABLET | Freq: Two times a day (BID) | ORAL | Status: DC

## 2011-04-17 MED ORDER — ATORVASTATIN CALCIUM 80 MG PO TABS: 80.0000 mg | ORAL_TABLET | Freq: Every day | ORAL | Status: DC

## 2011-04-17 MED ORDER — POTASSIUM CHLORIDE CRYS ER 20 MEQ PO TBCR: 40.0000 meq | EXTENDED_RELEASE_TABLET | Freq: Two times a day (BID) | ORAL | Status: DC

## 2011-04-17 NOTE — Progress Notes (Signed)
HPI [name] returns the office for close followup after being discharged from the hospital with acute on chronic diastolic heart failure. Despite following the heart failure protocol with metolazone q.a.m., her admission weight was up about 9-10 pounds. She also has the chest pain but ruled out for MI.  She was diuresed and discharged home. She takes 120 mg of furosemide twice a day he takes a metolazone 5 mg p.r.n. Weight greater than 3 pounds baseline.  When she went home she weight 218 pounds on her scales what she calls them through her insurance company. Her weight is  up to 223 this morning.  She denies any orthopnea or PND or edema. She was edematous when she was admitted to the hospital. She is having a little more  Dyspnea on exertion. She denies any leg cramps.  EKG was not repeated today. She denies any angina or ischemic symptoms otherwise. Past Medical History  Diagnosis Date  . Uterine cancer   . Diabetes mellitus     type II; peripheral neuropathy  . Hypertension   . GERD (gastroesophageal reflux disease)   . Peripheral vascular disease   . Obesity   . Dyslipidemia   . Diastolic CHF, chronic     EF 50-55%, mild LVH and grade 1 diast. Dysfxn  . Osteoarthritis cervical spine   . DM retinopathy   . Sleep apnea     with CPAP  . History of shingles   . COPD (chronic obstructive pulmonary disease)   . Depression   . Peptic ulcer disease     duodenal  . Peptic stricture of esophagus   . Hiatal hernia   . Diverticulosis   . Arthritis   . ASCVD (arteriosclerotic cardiovascular disease)     on MRI brin  . CAD (coronary artery disease)     a. s/p multiple caths with nonobs CAD;   b. cath 1/10: pLAD 20%, mLAD 40%, pCFX 20%, mCFX 40%, pRCA 60-70%  . Non-ischemic cardiomyopathy   . Asthma     Mild  . Pruritic condition     Idiopathic  . Hyperlipidemia   . Zoster   . Noncompliance     Past Surgical History  Procedure Date  . Tubal ligation 1967  . Knee  arthroscopy 10/1998    Left  . Craniotomy 1997    Left for SDH  . Cataract extraction, bilateral 2005  . Hernia repair   . Esophagogastroduodenoscopy 04/04/2004  . Spine surgery     C-spine and lumbar surgery  . Cholecystectomy 2010  . Cardiac catheterization   . Dexa 7/05  . Abdominal hysterectomy     Family History  Problem Relation Age of Onset  . Cancer Mother     "Stomach" Cancer  . Diabetes Mother   . Heart disease Mother   . Stomach cancer Mother   . Lymphoma Father   . Kidney disease Paternal Grandmother   . Asthma Other     History   Social History  . Marital Status: Married    Spouse Name: N/A    Number of Children: 7  . Years of Education: N/A   Occupational History  . Retired    Social History Main Topics  . Smoking status: Never Smoker   . Smokeless tobacco: Not on file  . Alcohol Use: Yes     rare  . Drug Use: No  . Sexually Active:    Other Topics Concern  . Not on file   Social History Narrative  .  No narrative on file    Allergies  Allergen Reactions  . Amlodipine Besylate   . Iohexol      Code: RASH, Desc: JENNIFER STATES ON PT'S CHART ALLERGIC TO IV DYE 09/04/07/RM, Onset Date: 16109604     Current Outpatient Prescriptions  Medication Sig Dispense Refill  . acetaminophen (TYLENOL) 500 MG tablet Take 500 mg by mouth as needed.        Marland Kitchen ADVAIR DISKUS 100-50 MCG/DOSE AEPB USE ONE (1) PUFF(S) TWICE DAILY  60 each  2  . aspirin 81 MG tablet Take 81 mg by mouth daily.        Marland Kitchen ezetimibe (ZETIA) 10 MG tablet Take 10 mg by mouth daily.        . furosemide (LASIX) 80 MG tablet Take by mouth. Take 1 1/2 tabs twice a day.      Marland Kitchen glucose blood (FREESTYLE TEST STRIPS) test strip 2x a day, dx 250.01      . hydrALAZINE (APRESOLINE) 25 MG tablet TAKE ONE (1) TABLET(S) THREE (3) TIMES DAILY  90 tablet  5  . HYDROcodone-acetaminophen (NORCO) 10-325 MG per tablet Take 0.5-1 tablets by mouth every 4 (four) hours as needed.        . insulin NPH  (HUMULIN N) 100 UNIT/ML injection Inject into the skin. Take 75 units in the morning and 40 units in the evening      . isosorbide dinitrate (ISORDIL) 30 MG tablet Take 30 mg by mouth 2 (two) times daily.        Marland Kitchen labetalol (NORMODYNE) 200 MG tablet Take 200 mg by mouth 2 (two) times daily.        Marland Kitchen latanoprost (XALATAN) 0.005 % ophthalmic solution Place 1 drop into both eyes at bedtime.      . metolazone (ZAROXOLYN) 5 MG tablet Take 1 tablet (5 mg total) by mouth daily as needed ( for weight gain of 3 pounds of more).  30 tablet  6  . nitroGLYCERIN (NITROSTAT) 0.4 MG SL tablet Place 0.4 mg under the tongue every 5 (five) minutes as needed. MAX 3 doses       . NON FORMULARY CPAP Machine: once daily.       . ondansetron (ZOFRAN) 4 MG tablet Take 4 mg by mouth every 6 (six) hours as needed.        . potassium chloride SA (KLOR-CON M20) 20 MEQ tablet Take 20 mEq by mouth 3 (three) times daily.       . rosuvastatin (CRESTOR) 40 MG tablet Take 1 tablet (40 mg total) by mouth daily.  30 tablet  11  . traMADol (ULTRAM) 50 MG tablet Take 50 mg by mouth every 6 (six) hours as needed.         ROS Negative other than HPI.   PE General Appearance: well developed, well nourished in no acute distress, morbidly obese HEENT: symmetrical face, PERRLA, good dentition  Neck: no JVD, thyromegaly, or adenopathy, trachea midline Chest: symmetric without deformity Cardiac: PMI non-displaced, RRR, normal S1, S2, no gallop or murmur Lung: clear to ausculation and percussion Vascular: all pulses full without bruits  Abdominal: nondistended, nontender, good bowel sounds, no HSM, no bruits Extremities: no cyanosis, clubbing or edema, no sign of DVT, no varicosities  Skin: normal color, no rashes Neuro: alert and oriented x 3, non-focal Pysch: normal affect Filed Vitals:   04/17/11 1102  BP: 114/60  Pulse: 58  Height: 5\' 6"  (1.676 m)  Weight: 226 lb (102.513 kg)  EKG  Labs and Studies Reviewed.   Lab  Results  Component Value Date   WBC 9.1 04/08/2011   HGB 15.3* 04/08/2011   HCT 45.4 04/08/2011   MCV 89.7 04/08/2011   PLT 246 04/08/2011      Chemistry      Component Value Date/Time   NA 141 04/08/2011 0600   K 4.1 04/08/2011 0600   CL 100 04/08/2011 0600   CO2 28 04/08/2011 0600   BUN 44* 04/08/2011 0600   CREATININE 1.57* 04/08/2011 0600      Component Value Date/Time   CALCIUM 9.4 04/08/2011 0600   CALCIUM 9.7 01/22/2010 2344   ALKPHOS 50 09/17/2010 0525   AST 27 09/17/2010 0525   ALT 30 09/17/2010 0525   BILITOT 1.1 09/17/2010 0525       Lab Results  Component Value Date   CHOL  Value: 240        ATP III CLASSIFICATION:  <200     mg/dL   Desirable  161-096  mg/dL   Borderline High  >=045    mg/dL   High       * 09/23/8117   CHOL 207* 07/06/2010   CHOL 188 05/18/2009   Lab Results  Component Value Date   HDL 66 09/11/2010   HDL 65.80 07/06/2010   HDL 14.78 05/18/2009   Lab Results  Component Value Date   LDLCALC  Value: 160        Total Cholesterol/HDL:CHD Risk Coronary Heart Disease Risk Table                     Men   Women  1/2 Average Risk   3.4   3.3  Average Risk       5.0   4.4  2 X Average Risk   9.6   7.1  3 X Average Risk  23.4   11.0        Use the calculated Patient Ratio above and the CHD Risk Table to determine the patient's CHD Risk.        ATP III CLASSIFICATION (LDL):  <100     mg/dL   Optimal  295-621  mg/dL   Near or Above                    Optimal  130-159  mg/dL   Borderline  308-657  mg/dL   High  >846     mg/dL   Very High* 9/62/9528   LDLCALC 103* 05/18/2009   LDLCALC  Value: 117        Total Cholesterol/HDL:CHD Risk Coronary Heart Disease Risk Table                     Men   Women  1/2 Average Risk   3.4   3.3  Average Risk       5.0   4.4  2 X Average Risk   9.6   7.1  3 X Average Risk  23.4   11.0        Use the calculated Patient Ratio above and the CHD Risk Table to determine the patient's CHD Risk.        ATP III CLASSIFICATION (LDL):  <100     mg/dL    Optimal  413-244  mg/dL   Near or Above                    Optimal  130-159  mg/dL  Borderline  160-189  mg/dL   High  >161     mg/dL   Very High* 0/02/6044   Lab Results  Component Value Date   TRIG 72 09/11/2010   TRIG 104.0 07/06/2010   TRIG 102.0 05/18/2009   Lab Results  Component Value Date   CHOLHDL 3.6 09/11/2010   CHOLHDL 3 07/06/2010   CHOLHDL 3 05/18/2009   Lab Results  Component Value Date   HGBA1C 10.3* 04/05/2011   Lab Results  Component Value Date   ALT 30 09/17/2010   AST 27 09/17/2010   ALKPHOS 50 09/17/2010   BILITOT 1.1 09/17/2010   Lab Results  Component Value Date   TSH 1.410 04/05/2011

## 2011-04-17 NOTE — Telephone Encounter (Signed)
i changed and sent rx 

## 2011-04-17 NOTE — Telephone Encounter (Signed)
Pt informed of new rx.  

## 2011-04-17 NOTE — Telephone Encounter (Signed)
R'cd fax from [facility] stating that pt is requesting a cheaper alternative for Crestor. They are suggesting Atorvastatin 80mg  qd. Please advise.

## 2011-04-17 NOTE — Assessment & Plan Note (Signed)
Her weight is up 5 pounds since discharge despite taking metolazone every morning. We'll increase her Lasix to 160 mg twice a day, daily metolazone 5 mg, and increase potassium to 40 mEq twice a day. We'll check electrolytes and potassium today.  If her weight continues to increase, she will let us know.  The next step would be to increase her metolazone to 7.5 mg q.a.m. We will have to watch for hypokalemia.

## 2011-04-17 NOTE — Patient Instructions (Addendum)
Your physician recommends that you have lab work today bmet.  We will call you with your results.  Your physician has recommended you make the following change in your medication:  increase furosemide ( fluid)   Increase potassium  Continue Metolazone   Your physician recommends that you weigh, daily, at the same time every day, and in the same amount of clothing. Please record your daily weights on the handout provided and bring it to your next appointment.  Your physician wants you to follow-up in: 2 weeks with Dr. Daleen Squibb. You will receive a reminder letter in the mail two months in advance. If you don't receive a letter, please call our office to schedule the follow-up appointment.

## 2011-04-19 ENCOUNTER — Other Ambulatory Visit: Payer: Self-pay | Admitting: *Deleted

## 2011-04-19 MED ORDER — FUROSEMIDE 80 MG PO TABS: 160.0000 mg | ORAL_TABLET | Freq: Two times a day (BID) | ORAL | Status: DC

## 2011-04-19 NOTE — Discharge Summary (Signed)
[name], [name] NO.:  [id number]  MEDICAL RECORD NO.:  [id number]  LOCATION:  2009                         FACILITY:  [facility]  PHYSICIAN:  Jesse Sans. Crimson Dubberly, MD, FACCDATE OF BIRTH:  01/05/1930  DATE OF ADMISSION:  04/05/2011 DATE OF DISCHARGE:  04/08/2011                              DISCHARGE SUMMARY   PRIMARY CARE PROVIDER:  Gregary Signs A. Everardo All, MD  DISCHARGE DIAGNOSIS:  Acute-on-chronic diastolic congestive heart failure.  SECONDARY DIAGNOSES: 1. History of nonobstructive coronary artery disease by     catheterization in January 2010. 2. Peripheral arterial disease. 3. Hypertension. 4. Hyperlipidemia. 5. Type 2 diabetes mellitus. 6. Obstructive sleep apnea. 7. Chronic obstructive pulmonary disease. 8. Asthma.  ALLERGIES:  AMLODIPINE and METOHEXAL.  PROCEDURES:  None.  HISTORY OF PRESENT ILLNESS:  A 77 year old African American female with the above problem list who was in her usual state of health until approximately 3 days prior to admission, which began to experience chest pain under bilateral breasts associated with dyspnea, palpitation, diaphoresis as well as abdominal discomfort.  She also noted an increase in weight of approximately 7 pounds and admitted to eating lots of soup and higher salt foods.  In the setting of weight gain, the patient developed orthopnea.  She was seen by her primary care provider on April 05, 2011 and ECG was performed showing new T-wave inversions, and the patient was subsequently transferred to [facility] for further evaluation.  In the ED, her point-of-care troponin was elevated at 0.16 with a CK of 624 and MB of 10.1.  Chest x-ray showed pulmonary vascular congestion.  The patient was admitted for further evaluation and management of acute-on-chronic diastolic congestive heart failure.  HOSPITAL COURSE:  The patient was placed on intravenous Lasix with good response and reduction in weight to 100 kg.  With  diuresis, the patient has had clinical improvement.  She did bump her creatinine to 1.77 with BUN of 43 on April 07, 2011, and at that point, her Lasix was switched from IV to p.o.  She has been stable overnight and was felt to be ready for discharge today.  We plan to resume her prior home dose of Lasix and have discussed the use of metolazone on an as needed basis only for weight gain greater than or equal to 3 pounds.  Of note, despite elevation in point-of-care markers, the patient's troponin remained negative throughout hospitalization.  She did have elevation of both her CK and MB with a normal relative index during this admission.  No further ischemic evaluation is felt to be necessary at this time.  DISCHARGE LABORATORY DATA:  Hemoglobin 15.3, hematocrit 45.4, WBC 9.1, platelets 246.  Sodium 141, potassium 4.1, chloride 100, CO2 28, BUN 44, creatinine 1.5, glucose 172, calcium 9.4.  Hemoglobin A1c 10.3.  CK 390, MB 6.7, troponin-I less than 0.30.  TSH 1.410.  MRSA screen was negative.  DISPOSITION:  The patient will be discharged home today in good condition.  FOLLOWUP PLANS AND APPOINTMENTS:  The patient will follow up with Dr. Valera Castle on April 17, 2011 at 11:15 a.m.  Follow up with Dr. Everardo All as previously scheduled.  DISCHARGE MEDICATIONS: 1. Zetia 10 mg daily. 2. Aspirin  81 mg daily. 3. Metolazone 5 mg daily p.r.n. weight gain greater than 3 pounds. 4. Acetaminophen 500 mg two tabs q.6 h. p.r.n. 5. Advair Diskus 100/50 one puff q.12 h. 6. Crestor 40 mg daily. 7. Humulin N 75 units in the a.m., 40 units in the p.m. 8. Hydralazine 25 mg t.i.d. 9. Hydrocodone/APAP 10/325 mg one tablet q.4 h. p.r.n. 10.Isordil 30 mg t.i.d. 11.Labetalol 200 mg b.i.d. 12.Lasix 80 mg one and a half tablets b.i.d. 13.Latanoprost 0.005% one drop both eyes at bedtime. 14.Nitroglycerin 0.4 mg sublingual p.r.n. chest pain. 15.Potassium chloride 20 mEq t.i.d. 16.Tramadol 50 mg q.6  h. p.r.n. 17.Zofran 4 mg q.6 h. p.r.n.  OUTSTANDING LABORATORY STUDIES:  None.  DURATION OF DISCHARGE ENCOUNTERED:  Forty five minutes including physician time.     Nicolasa Ducking, ANP   ______________________________ Jesse Sans. Daleen Squibb, MD, [facility]    CB/MEDQ  D:  04/08/2011  T:  04/09/2011  Job:  [id number]  cc:   Gregary Signs A. Everardo All, MD  Electronically Signed by Nicolasa Ducking ANP on 04/09/2011 04:19:40 PM Electronically Signed by Valera Castle MD [facility] on 04/19/2011 01:38:52 PM

## 2011-04-20 NOTE — H&P (Signed)
NAMEJERNI, Christina NO.:  [id number]  MEDICAL RECORD NO.:  [id number]  LOCATION:                                 FACILITY:  PHYSICIAN:  Marca Ancona, MD      DATE OF BIRTH:  [birth year]  DATE OF ADMISSION:  04/05/2011 DATE OF DISCHARGE:                             HISTORY & PHYSICAL   PRIMARY CARE PHYSICIAN:  Sean A. Everardo All, MD.  PRIMARY CARDIOLOGIST:  Jesse Sans. Wall, MD, [facility].  CHIEF COMPLAINT:  Chest pain/shortness of breath.  HISTORY OF PRESENT ILLNESS:  [name] is a 77 year old female with a history of chronic diastolic heart failure and coronary artery disease. She came to the emergency room today with a complaint of chest pain that had been going on for 3 days.  She describes it as being under both breasts.  It is intermittent and it is described as sharp.  It reached an 8/10.  It radiated to her right arm.  Associated symptoms include shortness of breath, palpitations, diaphoresis, and abdominal pain.  She also reports a 7-pound weight gain over the last 3 days.  She admits to dietary indiscretion with increased salty intake during this time.  She reports orthopnea, but denies lower extremity edema or cough.  She has not had fevers or chills, nausea or vomiting.  Her chest pain is made worse by deep inspiration and certain movements.  She attended an appointment with her primary care physician, Dr. Everardo All and an EKG showed inferolateral T-wave changes which were new.  She was transferred to [facility] Emergency Room by EMS.  EMS gave her a sublingual nitroglycerin, which improved her symptoms.  In the emergency room, cardiac enzymes were checked and were elevated, so Cardiology will admit.  She came into the hospital with similar symptoms in March 2012. She had EKG changes then and also elevated cardiac enzymes.  Cardiac catheterization showed nonobstructive coronary artery disease and her echo showed an EF of 50%-55% with diastolic  dysfunction.  She also had severe hypertension.  Currently in the emergency room, she is resting comfortably.  PAST MEDICAL HISTORY: 1. Diabetes. 2. Peripheral neuropathy. 3. Hypertension. 4. Gastroesophageal reflux disease. 5. Peripheral vascular disease/peripheral arterial disease. 6. Hyperlipidemia. 7. Obstructive sleep apnea. 8. COPD and asthma. 9. Chronic diastolic CHF with an EF of 50%-55% with mild LVH, grade 1     diastolic dysfunction by echocardiogram in March 2012. 10.Diabetic retinopathy and peripheral neuropathy. 11.Depression. 12.Remote history of peptic ulcer disease and peptic stricture of the     esophagus as well as hiatal hernia and diverticulosis. 13.Osteoarthritis. 14.Herpes zoster.  PAST SURGICAL HISTORY:  She is status post cardiac catheterizations as well as cholecystectomy, hysterectomy, spine surgery, EGD, hernia repair, cataract surgery, craniotomy for subdural hematoma, left knee surgery, and tubal ligation.  ALLERGIES:  Include: AMLODIPINE  CURRENT MEDICATIONS: 1. Advair b.i.d. 2. Aspirin 325 mg a day. 3. Zetia 10 mg a day. 4. Lasix 80 mg 1-1/2 tablets b.i.d. 5. Hydralazine 25 mg t.i.d. 6. Vicodin 10 mg p.r.n. 7. NPH insulin daily q.a.m. 8. Isordil 30 mg b.i.d. 9. Labetalol 200 mg b.i.d. 10.Zaroxolyn 5 mg b.i.d. 11.Sublingual nitroglycerin p.r.n. 12.CPAP at bedtime. 13.Zofran p.r.n. 14.Klor-Con 20 mEq 4  tablets daily. 15.Crestor 40 mg a day. 16.Ultram 50 mg q.4 h. p.r.n. 17.Ventolin q.6 h. p.r.n.  SOCIAL HISTORY:  She lives in [place] with her husband.  She is retired.  She denies any history of alcohol, tobacco, or drug abuse.  FAMILY HISTORY:  Both of her parents are deceased, her mother had coronary artery disease among other issues including cancer and diabetes.  Her father died with lymphoma, but no cardiac issues and no siblings have known cardiac disease.  REVIEW OF SYSTEMS:  The weight changes as described above.  She  had diaphoresis with the chest pain.  She has headaches.  She has chronic 2- pillow orthopnea and also describes PND.  She has dyspnea on exertion that is chronic, but with acute exacerbation.  She denies cough or wheezing.  She has chronic arthralgias and joint pain.  She has had no recent reflux symptoms or melena.  Full 14-point review of systems is otherwise negative except as stated in the HPI.  PHYSICAL EXAMINATION:  VITAL SIGNS:  Temperature is 97.5, blood pressure 129/72, heart rate 58, respiratory rate 18, O2 saturation 99% on room air. GENERAL:  She is an obese, well-developed [place] American female, in no acute distress. HEENT:  Normal for age with the exception of poor dentition. NECK:  There is no lymphadenopathy, thyromegaly, bruit, or JVD noted, but JVD is difficult to assess secondary to body habitus. CV:  Her heart is regular in rate and rhythm with an S1, S2 and no significant murmur, rub, or gallop is noted.  Distal pulses are intact in all 4 extremities. LUNGS:  Essentially clear to auscultation bilaterally. SKIN:  No rashes or lesions are noted. ABDOMEN:  Soft and nontender with active bowel sounds. EXTREMITIES:  There is no cyanosis or clubbing and only trace edema. MUSCULOSKELETAL:  There is no joint deformity or effusions and no spine or CVA tenderness. NEUROLOGIC:  She is alert and oriented with cranial nerves II through XII grossly intact.  No focal deficits noted.  Chest x-ray shows cardiomegaly with pulmonary vascular congestion.  EKG is sinus bradycardia, rate 58 with T-wave inversions laterally and anterolaterally that are slightly different from an EKG dated July 2012, which showed T-wave inversions inferiorly and anterolaterally.  LABORATORY VALUES:  Sodium 142, potassium 3.2, chloride 101, CO2 of 30, BUN 32, creatinine 1.47, glucose 77.  CK-MB 624/10.1, troponin I point- of-care 0.16.  Hemoglobin 14.2, hematocrit 42.9, WBCs 11.2,  platelets 239.  IMPRESSION:  [name] was seen today by Dr. Shirlee Latch, the patient evaluated and the data reviewed.  She has developed dyspnea with minimal exertion as well as orthopnea.  She has gained at least 7 pounds recently.  She has been eating a lot of soup.  She is on quite high doses of diuretics at home.  She also has pain in the left upper quadrant along with left costophrenic margin.  There is some tenderness in this area.  Her cardiac enzymes are mildly elevated and there are subtle EKG changes. 1. Congestive heart failure:  We suspect acute on chronic diastolic     congestive heart failure with significantly increased dyspnea on     exertion and increased weight.  She likely has dietary     indiscretion.  We also suspect diuretic resistance as she is on     very high doses of Lasix and metolazone.  We will check a BNP and     check an echo.  She will be placed on Lasix 80 mg  IV q.8 h.  Her     kidney function and potassium levels will be followed closely. 2. Coronary artery disease:  She had mild nonobstructive coronary     artery disease on cath in 2010.  She was admitted in March with the     same symptoms and the similar rising cardiac enzymes.  She was     treated for congestive heart failure exacerbation.  We think it is     more likely that her elevated cardiac enzymes are secondary to     demand ischemia from CHF, but we will follow her enzymes and her     EKGs closely.     a.     Heparin drip per pharmacy.     b.     Crestor 40 mg daily.     c.     Nitro paste 0.5 inch q.8 hours.     d.     Load Plavix which we may stop at discharge if we do not      think this is acute coronary syndrome plaque rupture.     e.     Continue labetalol.     f.     Continue aspirin.     g.     Hold off on cath for now with a GFR of 33, and we are not      sure if this is actually a plaque rupture event.  She will be      followed closely and monitored carefully.    Theodore Demark, PA-C   ______________________________ Marca Ancona, MD   RB/MEDQ  D:  04/05/2011  T:  04/06/2011  Job:  [id number]  Electronically Signed by Theodore Demark PA-C on 04/20/2011 12:00:26 AM Electronically Signed by Marca Ancona MD on 04/20/2011 11:49:53 PM

## 2011-04-22 ENCOUNTER — Telehealth: Payer: Self-pay | Admitting: *Deleted

## 2011-04-22 DIAGNOSIS — R609 Edema, unspecified: Secondary | ICD-10-CM

## 2011-04-22 DIAGNOSIS — I5032 Chronic diastolic (congestive) heart failure: Secondary | ICD-10-CM

## 2011-04-22 NOTE — Telephone Encounter (Signed)
I spoke with pt about bmp results. She will come back on Wednesday for a repeat bmp.  HHN was there today and bp was 124/60.  Wt 220. She is doing better but feeling tired and at times a little dizzy. Mylo Red RN

## 2011-04-24 ENCOUNTER — Other Ambulatory Visit (INDEPENDENT_AMBULATORY_CARE_PROVIDER_SITE_OTHER): Payer: Medicare Other | Admitting: *Deleted

## 2011-04-24 DIAGNOSIS — I509 Heart failure, unspecified: Secondary | ICD-10-CM

## 2011-04-24 DIAGNOSIS — R609 Edema, unspecified: Secondary | ICD-10-CM

## 2011-04-24 DIAGNOSIS — I5032 Chronic diastolic (congestive) heart failure: Secondary | ICD-10-CM

## 2011-04-24 LAB — BASIC METABOLIC PANEL
BUN: 23 mg/dL (ref 6–23)
CO2: 30 mEq/L (ref 19–32)
Chloride: 102 mEq/L (ref 96–112)
Potassium: 3.9 mEq/L (ref 3.5–5.1)

## 2011-04-25 ENCOUNTER — Telehealth: Payer: Self-pay

## 2011-04-25 NOTE — Telephone Encounter (Signed)
Judeth [facility] nurse from [facility] called to report that pt's instruction sheet from Dr Daleen Squibb states to take Potassium tid but the bottle states two tabs bid.  She is requesting that the pharmacy be called with the new instructions if that is what she is supposed to be taking so that the next bottle has the correct instructions on it.  She also states that pt has been having increased dizziness every day x one week.  BP has been running 138-140/80-90.  Pulse=60's.  Her weight is stable.

## 2011-04-26 NOTE — Telephone Encounter (Signed)
Detailed message LOVM   Pt was taking potassium tid. New orders were to take Potassium bid.  This would increase the daily potassium by . Mylo Red RN

## 2011-04-26 NOTE — Telephone Encounter (Signed)
[name] is aware Dr. Daleen Squibb has reviewed pt medications and current blood pressures. These blood pressures were after pt had taken morning medications. He did not feel this was a source of her dizziness. [name] will have pt follow-up with her primary care doctor for the dizziness. Mylo Red RN

## 2011-04-30 ENCOUNTER — Ambulatory Visit: Payer: Medicare Other | Admitting: Cardiology

## 2011-05-03 ENCOUNTER — Ambulatory Visit: Payer: Medicare Other | Admitting: Cardiology

## 2011-05-07 ENCOUNTER — Other Ambulatory Visit (HOSPITAL_COMMUNITY): Payer: Self-pay | Admitting: Obstetrics & Gynecology

## 2011-05-07 DIAGNOSIS — Z1231 Encounter for screening mammogram for malignant neoplasm of breast: Secondary | ICD-10-CM

## 2011-05-08 ENCOUNTER — Telehealth: Payer: Self-pay | Admitting: *Deleted

## 2011-05-08 NOTE — Telephone Encounter (Signed)
I spoke with pt after receiving weight chart from [facility]. Pt did have a 3 pound weight gain and she took her extra lasix.  Her weight is back down to 218. She denies shortness of breath or edema. Mylo Red RN

## 2011-05-21 ENCOUNTER — Other Ambulatory Visit: Payer: Self-pay | Admitting: Endocrinology

## 2011-05-23 ENCOUNTER — Encounter: Payer: Self-pay | Admitting: Cardiology

## 2011-05-23 ENCOUNTER — Ambulatory Visit (INDEPENDENT_AMBULATORY_CARE_PROVIDER_SITE_OTHER): Payer: Medicare Other | Admitting: Cardiology

## 2011-05-23 VITALS — BP 148/88 | HR 84 | Ht 66.0 in | Wt 221.0 lb

## 2011-05-23 DIAGNOSIS — I2510 Atherosclerotic heart disease of native coronary artery without angina pectoris: Secondary | ICD-10-CM

## 2011-05-23 DIAGNOSIS — I5032 Chronic diastolic (congestive) heart failure: Secondary | ICD-10-CM

## 2011-05-23 DIAGNOSIS — R0989 Other specified symptoms and signs involving the circulatory and respiratory systems: Secondary | ICD-10-CM

## 2011-05-23 DIAGNOSIS — I509 Heart failure, unspecified: Secondary | ICD-10-CM

## 2011-05-23 NOTE — Assessment & Plan Note (Signed)
Stable. Continue current medicines and protocol for weight gain. I have warned her about eating out a lot during the holidays particularly a gatherings and potential salt load. She will continue to weigh daily. I will see her back for close followup in 8 weeks

## 2011-05-23 NOTE — Assessment & Plan Note (Signed)
Carotid Dopplers are scheduled.

## 2011-05-23 NOTE — Progress Notes (Signed)
HPI [name] in today for close followup of her chronic diastolic heart failure.  Her weight has been stable. She falls are clear protocol with extra Lasix if needed. She's been very careful with her diet.  He had no further chest pain or angina. She is scheduled for carotid Dopplers.  She denies orthopnea, PND or increased edema  Past Medical History  Diagnosis Date  . Uterine cancer   . Diabetes mellitus     type II; peripheral neuropathy  . Hypertension   . GERD (gastroesophageal reflux disease)   . Peripheral vascular disease   . Obesity   . Dyslipidemia   . Diastolic CHF, chronic     EF 50-55%, mild LVH and grade 1 diast. Dysfxn  . Osteoarthritis cervical spine   . DM retinopathy   . Sleep apnea     with CPAP  . History of shingles   . COPD (chronic obstructive pulmonary disease)   . Depression   . Peptic ulcer disease     duodenal  . Peptic stricture of esophagus   . Hiatal hernia   . Diverticulosis   . Arthritis   . ASCVD (arteriosclerotic cardiovascular disease)     on MRI brin  . CAD (coronary artery disease)     a. s/p multiple caths with nonobs CAD;   b. cath 1/10: pLAD 20%, mLAD 40%, pCFX 20%, mCFX 40%, pRCA 60-70%  . Non-ischemic cardiomyopathy   . Asthma     Mild  . Pruritic condition     Idiopathic  . Hyperlipidemia   . Zoster   . Noncompliance     Current Outpatient Prescriptions  Medication Sig Dispense Refill  . acetaminophen (TYLENOL) 500 MG tablet Take 500 mg by mouth as needed.        Marland Kitchen ADVAIR DISKUS 100-50 MCG/DOSE AEPB USE ONE (1) PUFF(S) TWICE DAILY  60 each  2  . aspirin 81 MG tablet Take 81 mg by mouth daily.        Marland Kitchen ezetimibe (ZETIA) 10 MG tablet Take 10 mg by mouth daily.        . furosemide (LASIX) 80 MG tablet Take 2 tablets (160 mg total) by mouth 2 (two) times daily.  120 tablet  6  . gabapentin (NEURONTIN) 300 MG capsule as needed.      Marland Kitchen glucose blood (FREESTYLE TEST STRIPS) test strip 2x a day, dx 250.01      .  hydrALAZINE (APRESOLINE) 25 MG tablet TAKE ONE (1) TABLET(S) THREE (3) TIMES DAILY  90 tablet  5  . HYDROcodone-acetaminophen (NORCO) 10-325 MG per tablet Take 0.5-1 tablets by mouth every 4 (four) hours as needed.        . insulin NPH (HUMULIN N) 100 UNIT/ML injection Inject into the skin. Take 75 units in the morning and 40 units in the evening      . isosorbide dinitrate (ISORDIL) 30 MG tablet Take 30 mg by mouth 2 (two) times daily.        Marland Kitchen labetalol (NORMODYNE) 200 MG tablet TAKE ONE (1) TABLET(S) TWICE DAILY  60 tablet  5  . latanoprost (XALATAN) 0.005 % ophthalmic solution Place 1 drop into both eyes at bedtime.      . metolazone (ZAROXOLYN) 5 MG tablet Take 1 tablet (5 mg total) by mouth daily as needed ( for weight gain of 3 pounds of more).  30 tablet  6  . nitroGLYCERIN (NITROSTAT) 0.4 MG SL tablet Place 0.4 mg under the tongue every  5 (five) minutes as needed. MAX 3 doses       . NON FORMULARY CPAP Machine: once daily.       . ondansetron (ZOFRAN) 4 MG tablet Take 4 mg by mouth every 6 (six) hours as needed.        . potassium chloride SA (KLOR-CON M20) 20 MEQ tablet Take 2 tablets (40 mEq total) by mouth 2 (two) times daily.  120 tablet  6  . rosuvastatin (CRESTOR) 40 MG tablet Take 40 mg by mouth daily.        . traMADol (ULTRAM) 50 MG tablet Take 50 mg by mouth every 6 (six) hours as needed.       Marland Kitchen atorvastatin (LIPITOR) 80 MG tablet Take 1 tablet (80 mg total) by mouth daily.  30 tablet  11    Allergies  Allergen Reactions  . Amlodipine Besylate   . Iohexol      Code: RASH, Desc: JENNIFER STATES ON PT'S CHART ALLERGIC TO IV DYE 09/04/07/RM, Onset Date: 11914782     Family History  Problem Relation Age of Onset  . Cancer Mother     "Stomach" Cancer  . Diabetes Mother   . Heart disease Mother   . Stomach cancer Mother   . Lymphoma Father   . Kidney disease Paternal Grandmother   . Asthma Other     History   Social History  . Marital Status: Married    Spouse  Name: N/A    Number of Children: 7  . Years of Education: N/A   Occupational History  . Retired    Social History Main Topics  . Smoking status: Never Smoker   . Smokeless tobacco: Not on file  . Alcohol Use: Yes     rare  . Drug Use: No  . Sexually Active:    Other Topics Concern  . Not on file   Social History Narrative  . No narrative on file    ROS ALL NEGATIVE EXCEPT THOSE NOTED IN HPI  PE  General Appearance: well developed, well nourished in no acute distress, morbidly obese HEENT: symmetrical face, PERRLA, good dentition  Neck: no JVD, thyromegaly, or adenopathy, trachea midline Chest: symmetric without deformity Cardiac: PMI non-displaced, RRR, normal S1, S2, no gallop or murmur Lung: clear to ausculation and percussion Vascular: all pulses full without bruits  Abdominal: nondistended, nontender, good bowel sounds, no HSM, no bruits Extremities: no cyanosis, clubbing, Minimal pretibial edema no sign of DVT, no varicosities  Skin: normal color, no rashes Neuro: alert and oriented x 3, non-focal Pysch: normal affect  EKG  BMET    Component Value Date/Time   NA 143 04/24/2011 1444   K 3.9 04/24/2011 1444   CL 102 04/24/2011 1444   CO2 30 04/24/2011 1444   GLUCOSE 190* 04/24/2011 1444   BUN 23 04/24/2011 1444   CREATININE 1.5* 04/24/2011 1444   CALCIUM 9.4 04/24/2011 1444   CALCIUM 9.7 01/22/2010 2344   GFRNONAA 30* 04/08/2011 0600   GFRAA 35* 04/08/2011 0600    Lipid Panel     Component Value Date/Time   CHOL  Value: 240        ATP III CLASSIFICATION:  <200     mg/dL   Desirable  956-213  mg/dL   Borderline High  >=086    mg/dL   High       * 5/78/4696 0605   TRIG 72 09/11/2010 0605   HDL 66 09/11/2010 0605   CHOLHDL 3.6 09/11/2010 2952  VLDL 14 09/11/2010 0605   LDLCALC  Value: 160        Total Cholesterol/HDL:CHD Risk Coronary Heart Disease Risk Table                     Men   Women  1/2 Average Risk   3.4   3.3  Average Risk       5.0   4.4  2 X Average  Risk   9.6   7.1  3 X Average Risk  23.4   11.0        Use the calculated Patient Ratio above and the CHD Risk Table to determine the patient's CHD Risk.        ATP III CLASSIFICATION (LDL):  <100     mg/dL   Optimal  454-098  mg/dL   Near or Above                    Optimal  130-159  mg/dL   Borderline  119-147  mg/dL   High  >829     mg/dL   Very High* 5/62/1308 0605    CBC    Component Value Date/Time   WBC 9.1 04/08/2011 0600   RBC 5.06 04/08/2011 0600   HGB 15.3* 04/08/2011 0600   HCT 45.4 04/08/2011 0600   PLT 246 04/08/2011 0600   MCV 89.7 04/08/2011 0600   MCH 30.2 04/08/2011 0600   MCHC 33.7 04/08/2011 0600   RDW 13.6 04/08/2011 0600   LYMPHSABS 1.9 04/05/2011 1316   MONOABS 0.6 04/05/2011 1316   EOSABS 0.2 04/05/2011 1316   BASOSABS 0.0 04/05/2011 1316

## 2011-05-23 NOTE — Assessment & Plan Note (Signed)
No current symptoms of ischemia. Continue medical therapy and secondary prevention.

## 2011-05-23 NOTE — Patient Instructions (Signed)
Your physician recommends that you follow up with Dr. Daleen Squibb on February 6th at 10:30am.

## 2011-05-31 ENCOUNTER — Other Ambulatory Visit: Payer: Self-pay | Admitting: Cardiology

## 2011-05-31 DIAGNOSIS — I6529 Occlusion and stenosis of unspecified carotid artery: Secondary | ICD-10-CM

## 2011-06-03 ENCOUNTER — Encounter (INDEPENDENT_AMBULATORY_CARE_PROVIDER_SITE_OTHER): Payer: Medicare Other | Admitting: Cardiology

## 2011-06-03 DIAGNOSIS — I6529 Occlusion and stenosis of unspecified carotid artery: Secondary | ICD-10-CM

## 2011-06-07 ENCOUNTER — Ambulatory Visit (HOSPITAL_COMMUNITY)
Admission: RE | Admit: 2011-06-07 | Discharge: 2011-06-07 | Disposition: A | Discharge status: Home / Self Care | Payer: Medicare Other | Source: Ambulatory Visit | Attending: Obstetrics & Gynecology | Admitting: Obstetrics & Gynecology

## 2011-06-07 DIAGNOSIS — Z1231 Encounter for screening mammogram for malignant neoplasm of breast: Secondary | ICD-10-CM | POA: Insufficient documentation

## 2011-06-20 ENCOUNTER — Other Ambulatory Visit: Payer: Self-pay | Admitting: Endocrinology

## 2011-06-27 ENCOUNTER — Emergency Department (HOSPITAL_COMMUNITY)
Admission: EM | Admit: 2011-06-27 | Discharge: 2011-06-27 | Disposition: A | Discharge status: Home / Self Care | Payer: Medicare Other | Attending: Emergency Medicine | Admitting: Emergency Medicine

## 2011-06-27 ENCOUNTER — Other Ambulatory Visit: Payer: Self-pay

## 2011-06-27 ENCOUNTER — Emergency Department (HOSPITAL_COMMUNITY): Payer: Medicare Other

## 2011-06-27 ENCOUNTER — Encounter (HOSPITAL_COMMUNITY): Payer: Self-pay | Admitting: Emergency Medicine

## 2011-06-27 DIAGNOSIS — M79609 Pain in unspecified limb: Secondary | ICD-10-CM | POA: Insufficient documentation

## 2011-06-27 DIAGNOSIS — F3289 Other specified depressive episodes: Secondary | ICD-10-CM | POA: Insufficient documentation

## 2011-06-27 DIAGNOSIS — I2510 Atherosclerotic heart disease of native coronary artery without angina pectoris: Secondary | ICD-10-CM | POA: Insufficient documentation

## 2011-06-27 DIAGNOSIS — R059 Cough, unspecified: Secondary | ICD-10-CM | POA: Insufficient documentation

## 2011-06-27 DIAGNOSIS — J4489 Other specified chronic obstructive pulmonary disease: Secondary | ICD-10-CM | POA: Insufficient documentation

## 2011-06-27 DIAGNOSIS — R0602 Shortness of breath: Secondary | ICD-10-CM | POA: Insufficient documentation

## 2011-06-27 DIAGNOSIS — I739 Peripheral vascular disease, unspecified: Secondary | ICD-10-CM | POA: Insufficient documentation

## 2011-06-27 DIAGNOSIS — M129 Arthropathy, unspecified: Secondary | ICD-10-CM | POA: Insufficient documentation

## 2011-06-27 DIAGNOSIS — I10 Essential (primary) hypertension: Secondary | ICD-10-CM | POA: Insufficient documentation

## 2011-06-27 DIAGNOSIS — R05 Cough: Secondary | ICD-10-CM | POA: Insufficient documentation

## 2011-06-27 DIAGNOSIS — F329 Major depressive disorder, single episode, unspecified: Secondary | ICD-10-CM | POA: Insufficient documentation

## 2011-06-27 DIAGNOSIS — R61 Generalized hyperhidrosis: Secondary | ICD-10-CM | POA: Insufficient documentation

## 2011-06-27 DIAGNOSIS — E119 Type 2 diabetes mellitus without complications: Secondary | ICD-10-CM | POA: Insufficient documentation

## 2011-06-27 DIAGNOSIS — E785 Hyperlipidemia, unspecified: Secondary | ICD-10-CM | POA: Insufficient documentation

## 2011-06-27 DIAGNOSIS — R0789 Other chest pain: Secondary | ICD-10-CM

## 2011-06-27 DIAGNOSIS — R071 Chest pain on breathing: Secondary | ICD-10-CM | POA: Insufficient documentation

## 2011-06-27 DIAGNOSIS — Z8711 Personal history of peptic ulcer disease: Secondary | ICD-10-CM | POA: Insufficient documentation

## 2011-06-27 DIAGNOSIS — J449 Chronic obstructive pulmonary disease, unspecified: Secondary | ICD-10-CM | POA: Insufficient documentation

## 2011-06-27 DIAGNOSIS — K219 Gastro-esophageal reflux disease without esophagitis: Secondary | ICD-10-CM | POA: Insufficient documentation

## 2011-06-27 DIAGNOSIS — Z794 Long term (current) use of insulin: Secondary | ICD-10-CM | POA: Insufficient documentation

## 2011-06-27 DIAGNOSIS — I5032 Chronic diastolic (congestive) heart failure: Secondary | ICD-10-CM | POA: Insufficient documentation

## 2011-06-27 DIAGNOSIS — Z8542 Personal history of malignant neoplasm of other parts of uterus: Secondary | ICD-10-CM | POA: Insufficient documentation

## 2011-06-27 DIAGNOSIS — Z7982 Long term (current) use of aspirin: Secondary | ICD-10-CM | POA: Insufficient documentation

## 2011-06-27 DIAGNOSIS — M47812 Spondylosis without myelopathy or radiculopathy, cervical region: Secondary | ICD-10-CM | POA: Insufficient documentation

## 2011-06-27 DIAGNOSIS — J40 Bronchitis, not specified as acute or chronic: Secondary | ICD-10-CM | POA: Insufficient documentation

## 2011-06-27 DIAGNOSIS — Z79899 Other long term (current) drug therapy: Secondary | ICD-10-CM | POA: Insufficient documentation

## 2011-06-27 DIAGNOSIS — R509 Fever, unspecified: Secondary | ICD-10-CM | POA: Insufficient documentation

## 2011-06-27 DIAGNOSIS — I509 Heart failure, unspecified: Secondary | ICD-10-CM | POA: Insufficient documentation

## 2011-06-27 LAB — BASIC METABOLIC PANEL
BUN: 21 mg/dL (ref 6–23)
Calcium: 9.3 mg/dL (ref 8.4–10.5)
Creatinine, Ser: 1.3 mg/dL — ABNORMAL HIGH (ref 0.50–1.10)
GFR calc non Af Amer: 38 mL/min — ABNORMAL LOW (ref >90)
Glucose, Bld: 300 mg/dL — ABNORMAL HIGH (ref 70–99)
Sodium: 136 mEq/L (ref 135–145)

## 2011-06-27 LAB — CBC
MCH: 29.5 pg (ref 26.0–34.0)
MCV: 87 fL (ref 78.0–100.0)
Platelets: 232 10*3/uL (ref 150–400)
RBC: 5.45 MIL/uL — ABNORMAL HIGH (ref 3.87–5.11)

## 2011-06-27 LAB — DIFFERENTIAL
Eosinophils Absolute: 0 10*3/uL (ref 0.0–0.7)
Eosinophils Relative: 0 % (ref 0–5)
Lymphs Abs: 1.1 10*3/uL (ref 0.7–4.0)
Monocytes Absolute: 0.9 10*3/uL (ref 0.1–1.0)

## 2011-06-27 MED ORDER — IPRATROPIUM BROMIDE 0.02 % IN SOLN
0.5000 mg | Freq: Once | RESPIRATORY_TRACT | Status: AC
  Administered 2011-06-27: 0.5 mg via RESPIRATORY_TRACT

## 2011-06-27 MED ORDER — HYDROCODONE-ACETAMINOPHEN 5-325 MG PO TABS: 1.0000 | ORAL_TABLET | ORAL | Status: DC | PRN

## 2011-06-27 MED ORDER — ALBUTEROL SULFATE (5 MG/ML) 0.5% IN NEBU
2.5000 mg | INHALATION_SOLUTION | Freq: Once | RESPIRATORY_TRACT | Status: AC
  Administered 2011-06-27: 2.5 mg via RESPIRATORY_TRACT
  Filled 2011-06-27 (×2): qty 0.5

## 2011-06-27 MED ORDER — ALBUTEROL SULFATE HFA 108 (90 BASE) MCG/ACT IN AERS: 2.0000 | INHALATION_SPRAY | RESPIRATORY_TRACT | Status: DC | PRN

## 2011-06-27 MED ORDER — ACETAMINOPHEN 325 MG PO TABS
650.0000 mg | ORAL_TABLET | Freq: Once | ORAL | Status: AC
  Administered 2011-06-27: 650 mg via ORAL
  Filled 2011-06-27: qty 2

## 2011-06-27 NOTE — ED Notes (Signed)
Patient states slight shortness of breath compared to before respiratory treatment.  Airway intact bilateral equal chest rise and fall.  Lungs clear in all fields.  Husband at bedside. Watching TV.

## 2011-06-27 NOTE — ED Provider Notes (Signed)
History     CSN: [id number]  Arrival date & time 06/27/11  1441   First MD Initiated Contact with Patient 06/27/11 1459      Chief Complaint  Patient presents with  . Chest Pain    (Consider location/radiation/quality/duration/timing/severity/associated sxs/prior treatment) Patient is a 78 y.o. female presenting with chest pain. The history is provided by the patient.  Chest Pain   She started having a cough last night. The cough has been productive of yellowish sputum. She started having sharp, left-sided chest pain this morning at about 10:30 AM. Pain is severe and she rates it at 8/10 at rest. When she coughs or takes a deep breath, pain increases to 9/10. There has been associated shortness of breath and low-grade fever. She says her temperature was as high as 99.2 at home. She has also had chills and sweats. There's been no nausea or vomiting. Pain does radiate to the left arm. She tried taking aspirin and nitroglycerin but it did not help any. Nothing has made the pain less severe. She denies arthralgias or myalgias.  Past Medical History  Diagnosis Date  . Uterine cancer   . Diabetes mellitus     type II; peripheral neuropathy  . Hypertension   . GERD (gastroesophageal reflux disease)   . Peripheral vascular disease   . Obesity   . Dyslipidemia   . Diastolic CHF, chronic     EF 50-55%, mild LVH and grade 1 diast. Dysfxn  . Osteoarthritis cervical spine   . DM retinopathy   . Sleep apnea     with CPAP  . History of shingles   . COPD (chronic obstructive pulmonary disease)   . Depression   . Peptic ulcer disease     duodenal  . Peptic stricture of esophagus   . Hiatal hernia   . Diverticulosis   . Arthritis   . ASCVD (arteriosclerotic cardiovascular disease)     on MRI brin  . CAD (coronary artery disease)     a. s/p multiple caths with nonobs CAD;   b. cath 1/10: pLAD 20%, mLAD 40%, pCFX 20%, mCFX 40%, pRCA 60-70%  . Non-ischemic cardiomyopathy   . Asthma    Mild  . Pruritic condition     Idiopathic  . Hyperlipidemia   . Zoster   . Noncompliance     Past Surgical History  Procedure Date  . Tubal ligation 1967  . Knee arthroscopy 10/1998    Left  . Craniotomy 1997    Left for SDH  . Cataract extraction, bilateral 2005  . Hernia repair   . Esophagogastroduodenoscopy 04/04/2004  . Spine surgery     C-spine and lumbar surgery  . Cholecystectomy 2010  . Cardiac catheterization   . Dexa 7/05  . Abdominal hysterectomy     Family History  Problem Relation Age of Onset  . Cancer Mother     "Stomach" Cancer  . Diabetes Mother   . Heart disease Mother   . Stomach cancer Mother   . Lymphoma Father   . Kidney disease Paternal Grandmother   . Asthma Other     History  Substance Use Topics  . Smoking status: Never Smoker   . Smokeless tobacco: Not on file  . Alcohol Use: Yes     rare    OB History    Grav Para Term Preterm Abortions TAB SAB Ect Mult Living                  Review  of Systems  Cardiovascular: Positive for chest pain.  All other systems reviewed and are negative.    Allergies  Amlodipine besylate and Iohexol  Home Medications   Current Outpatient Rx  Name Route Sig Dispense Refill  . ACETAMINOPHEN 500 MG PO TABS Oral Take 500 mg by mouth as needed.      Marland Kitchen ADVAIR DISKUS 100-50 MCG/DOSE IN AEPB  USE ONE (1) PUFF(S) TWICE DAILY 60 each 2  . ASPIRIN 81 MG PO TABS Oral Take 81 mg by mouth daily.      . ATORVASTATIN CALCIUM 80 MG PO TABS Oral Take 1 tablet (80 mg total) by mouth daily. 30 tablet 11  . EZETIMIBE 10 MG PO TABS Oral Take 10 mg by mouth daily.      . FUROSEMIDE 80 MG PO TABS Oral Take 2 tablets (160 mg total) by mouth 2 (two) times daily. 120 tablet 6  . GABAPENTIN 300 MG PO CAPS  as needed.    Marland Kitchen GLUCOSE BLOOD VI STRP  2x a day, dx 250.01    . HYDRALAZINE HCL 25 MG PO TABS  TAKE ONE (1) TABLET(S) THREE (3) TIMES DAILY 90 tablet 5  . HYDROCODONE-ACETAMINOPHEN 10-325 MG PO TABS Oral Take 0.5-1  tablets by mouth every 4 (four) hours as needed.      . INSULIN ISOPHANE HUMAN 100 UNIT/ML [place] SUSP Subcutaneous Inject 220 Units into the skin every morning. 80 mL 3  . ISOSORBIDE DINITRATE 30 MG PO TABS Oral Take 30 mg by mouth 2 (two) times daily.      Marland Kitchen LABETALOL HCL 200 MG PO TABS  TAKE ONE (1) TABLET(S) TWICE DAILY 60 tablet 5  . LATANOPROST 0.005 % OP SOLN Both Eyes Place 1 drop into both eyes at bedtime.    Marland Kitchen METOLAZONE 5 MG PO TABS Oral Take 1 tablet (5 mg total) by mouth daily as needed ( for weight gain of 3 pounds of more). 30 tablet 6  . NITROGLYCERIN 0.4 MG SL SUBL Sublingual Place 0.4 mg under the tongue every 5 (five) minutes as needed. MAX 3 doses     . NON FORMULARY  CPAP Machine: once daily.     Marland Kitchen ONDANSETRON HCL 4 MG PO TABS Oral Take 4 mg by mouth every 6 (six) hours as needed.      Marland Kitchen POTASSIUM CHLORIDE CRYS ER 20 MEQ PO TBCR Oral Take 2 tablets (40 mEq total) by mouth 2 (two) times daily. 120 tablet 6  . ROSUVASTATIN CALCIUM 40 MG PO TABS Oral Take 40 mg by mouth daily.      . TRAMADOL HCL 50 MG PO TABS Oral Take 50 mg by mouth every 6 (six) hours as needed.       BP 173/90  Pulse 93  Temp(Src) 99.2 F (37.3 C) (Oral)  SpO2 95%  Physical Exam  Nursing note and vitals reviewed.  78 year old female who is resting comfortably and in no acute distress. Vital signs are significant for moderate systolic hypertension with blood pressure 173/90. Oxygen saturation is 95% which is normal. Head is normocephalic and atraumatic. PERRLA, EOMI. There's no sinus tenderness. Oropharynx clear. Neck is supple without adenopathy or JVD. There is no cervical spine tenderness. Back is nontender. Lungs are clear without rales, wheezes, or rhonchi. Even with forced exhalation, I can detect no prolongation of exhalation phase or wheezing. Heart has regular rate and rhythm without murmur. There is moderate left parasternal chest wall tenderness. Abdomen is soft, flat, nontender without masses or  hepatosplenomegaly. Extremities have no cyanosis or edema. Skin is warm and moist without rash. Neurologic: Mental status is normal, cranial nerves are intact, there no focal motor or sensory deficits. Psychiatric: No abnormalities of mood or affect.  ED Course  Procedures (including critical care time)   Labs Reviewed  CBC  DIFFERENTIAL  BASIC METABOLIC PANEL  TROPONIN I   No results found. Results for orders placed during the hospital encounter of 06/27/11  CBC      Component Value Range   WBC 7.3  4.0 - 10.5 (K/uL)   RBC 5.45 (*) 3.87 - 5.11 (MIL/uL)   Hemoglobin 16.1 (*) 12.0 - 15.0 (g/dL)   HCT 95.6 (*) 21.3 - 46.0 (%)   MCV 87.0  78.0 - 100.0 (fL)   MCH 29.5  26.0 - 34.0 (pg)   MCHC 34.0  30.0 - 36.0 (g/dL)   RDW 08.6  57.8 - 46.9 (%)   Platelets 232  150 - 400 (K/uL)  DIFFERENTIAL      Component Value Range   Neutrophils Relative 72  43 - 77 (%)   Neutro Abs 5.2  1.7 - 7.7 (K/uL)   Lymphocytes Relative 15  12 - 46 (%)   Lymphs Abs 1.1  0.7 - 4.0 (K/uL)   Monocytes Relative 13 (*) 3 - 12 (%)   Monocytes Absolute 0.9  0.1 - 1.0 (K/uL)   Eosinophils Relative 0  0 - 5 (%)   Eosinophils Absolute 0.0  0.0 - 0.7 (K/uL)   Basophils Relative 0  0 - 1 (%)   Basophils Absolute 0.0  0.0 - 0.1 (K/uL)  BASIC METABOLIC PANEL      Component Value Range   Sodium 136  135 - 145 (mEq/L)   Potassium 4.0  3.5 - 5.1 (mEq/L)   Chloride 99  96 - 112 (mEq/L)   CO2 24  19 - 32 (mEq/L)   Glucose, Bld 300 (*) 70 - 99 (mg/dL)   BUN 21  6 - 23 (mg/dL)   Creatinine, Ser 6.29 (*) 0.50 - 1.10 (mg/dL)   Calcium 9.3  8.4 - 52.8 (mg/dL)   GFR calc non Af Amer 38 (*) >90 (mL/min)   GFR calc Af Amer 44 (*) >90 (mL/min)  TROPONIN I      Component Value Range   Troponin I <0.30  <0.30 (ng/mL)   Dg Chest 2 View  06/27/2011  *RADIOLOGY REPORT*  Clinical Data: Cough and shortness of breath.  CHEST - 2 VIEW  Comparison: Chest x-ray 04/05/2011.  Findings: The heart is borderline enlarged but stable.   There is tortuosity and calcification of the thoracic aorta.  There are mild chronic bronchitic type lung changes but no acute overlying pulmonary process.  No pleural effusion.  The bony thorax is intact.  IMPRESSION: No acute cardiopulmonary findings.  Original Report Authenticated By: P. Loralie Champagne, M.D.   Mm Digital Screening  06/12/2011  DG SCREEN MAMMOGRAM BILATERAL Bilateral CC and MLO view(s) were taken.  DIGITAL SCREENING MAMMOGRAM WITH CAD:  Comparison:  Prior studies.  There are scattered fibroglandular densities.  There is no dominant mass, architectural distortion  or calcification to suggest malignancy.  Images were processed with CAD.  IMPRESSION:  No mammographic evidence of malignancy.  Suggest yearly screening mammography.  A result letter of this screening mammogram will be mailed directly to the patient.  ASSESSMENT: Negative - BI-RADS 1  Screening mammogram in 1 year. ,      No diagnosis found.   Date:  06/27/2011  Rate: 89  Rhythm: normal sinus rhythm  QRS Axis: normal  Intervals: normal  ST/T Wave abnormalities: nonspecific T wave changes  Conduction Disutrbances:none  Narrative Interpretation: Nonspecific T wave changes. When compared with ECG of 04/07/2011, inverted as T waves in the lateral and anterolateral leads are now flat.  Old EKG Reviewed: changes noted  1700: Patient states that she feels much better after a dose of acetaminophen and an albuterol with Atrovent nebulizer treatment.  Impression: Acute bronchitis. Chest wall pain.  She will be sent home with a prescription for albuterol inhaler to use for cough and prescription for Norco to use as needed for pain is not relieved by nitroglycerin or ibuprofen.  MDM  Chest pain which seems more likely to be related to a respiratory tract infection and cough than cardiac etiology. Cardiac markers will be checked as well as chest x-ray and sure we given an albuterol nebulizer treatment to assess response of  cough to that.        Dione Booze, MD 06/27/11 1705

## 2011-06-27 NOTE — ED Notes (Signed)
Per EMS: pt from home c/o midsternal CP with radiation to right arm starting today; pt c/o SOB; pt took 324mg  ASA and 1 SL nitro at home without relief

## 2011-07-24 ENCOUNTER — Ambulatory Visit: Payer: Medicare Other | Admitting: Cardiology

## 2011-07-30 ENCOUNTER — Telehealth: Payer: Self-pay | Admitting: Pulmonary Disease

## 2011-07-30 NOTE — Telephone Encounter (Signed)
Called and spoke with pt to get more details on what is going on with her cpap.  Pt c/o cpap causing "nose to be stopped up" despite increasing her humidifier on the cpap.  Also, pt wonders if her pressure may need to be increased.  States she has difficulty breathing with current cpap pressure and feels like she is "suffocating."  Pt is aware KC out of office and will return tomorrow and is ok to wait until tomorrow for a response from him.  KC, please advise. Thanks!!

## 2011-07-31 NOTE — Telephone Encounter (Signed)
LMTCB x1 for pt.  

## 2011-07-31 NOTE — Telephone Encounter (Signed)
When I saw her in may of last year, I recommended nasonex (gave her samples) and told her to take chlorpheniramine 8mg  at bedtime.  She was to call back if she did not see improvement, and I did not hear back so assumed this was better.  If this did help, she needs to get back on it.  If it didn't help, the only thing to do is to refer to ENT for evaluation if turning up heat on humidifier did not help.  I would NOT adjust pressure until the nasal congestion issue is resolved.

## 2011-08-01 MED ORDER — MOMETASONE FUROATE 50 MCG/ACT NA SUSP: NASAL | Status: DC

## 2011-08-01 NOTE — Telephone Encounter (Signed)
I called, spoke with pt.  I informed her of KC's thoughts.  Pt states the nasonex and chlorpheniramine did help but she is currently not taking either and has been off both for a while.  I advised KC recs she needs to get back on these and will send nasonex rx to [facility] on [address].  I advised pt if these symptoms continue after she restarts both of these meds to please call us back so we can we can make further recommendations.  Pt verbalized understanding of this and voiced no further questions at this time.  I will forward message to [facility] so he is aware.

## 2011-08-01 NOTE — Telephone Encounter (Signed)
I also called [place] and informed her of below -- nothing further needed.

## 2011-08-11 ENCOUNTER — Emergency Department (HOSPITAL_COMMUNITY)
Admission: EM | Admit: 2011-08-11 | Discharge: 2011-08-11 | Disposition: A | Discharge status: Home / Self Care | Payer: Medicare Other | Attending: Emergency Medicine | Admitting: Emergency Medicine

## 2011-08-11 ENCOUNTER — Emergency Department (HOSPITAL_COMMUNITY): Payer: Medicare Other

## 2011-08-11 ENCOUNTER — Encounter (HOSPITAL_COMMUNITY): Payer: Self-pay | Admitting: *Deleted

## 2011-08-11 DIAGNOSIS — I509 Heart failure, unspecified: Secondary | ICD-10-CM | POA: Insufficient documentation

## 2011-08-11 DIAGNOSIS — J4489 Other specified chronic obstructive pulmonary disease: Secondary | ICD-10-CM | POA: Insufficient documentation

## 2011-08-11 DIAGNOSIS — E119 Type 2 diabetes mellitus without complications: Secondary | ICD-10-CM | POA: Insufficient documentation

## 2011-08-11 DIAGNOSIS — R197 Diarrhea, unspecified: Secondary | ICD-10-CM | POA: Insufficient documentation

## 2011-08-11 DIAGNOSIS — E669 Obesity, unspecified: Secondary | ICD-10-CM | POA: Insufficient documentation

## 2011-08-11 DIAGNOSIS — G4730 Sleep apnea, unspecified: Secondary | ICD-10-CM | POA: Insufficient documentation

## 2011-08-11 DIAGNOSIS — Z794 Long term (current) use of insulin: Secondary | ICD-10-CM | POA: Insufficient documentation

## 2011-08-11 DIAGNOSIS — I5032 Chronic diastolic (congestive) heart failure: Secondary | ICD-10-CM | POA: Insufficient documentation

## 2011-08-11 DIAGNOSIS — J449 Chronic obstructive pulmonary disease, unspecified: Secondary | ICD-10-CM | POA: Insufficient documentation

## 2011-08-11 DIAGNOSIS — K219 Gastro-esophageal reflux disease without esophagitis: Secondary | ICD-10-CM | POA: Insufficient documentation

## 2011-08-11 DIAGNOSIS — E785 Hyperlipidemia, unspecified: Secondary | ICD-10-CM | POA: Insufficient documentation

## 2011-08-11 DIAGNOSIS — R112 Nausea with vomiting, unspecified: Secondary | ICD-10-CM | POA: Insufficient documentation

## 2011-08-11 DIAGNOSIS — R5381 Other malaise: Secondary | ICD-10-CM | POA: Insufficient documentation

## 2011-08-11 DIAGNOSIS — R05 Cough: Secondary | ICD-10-CM | POA: Insufficient documentation

## 2011-08-11 DIAGNOSIS — I10 Essential (primary) hypertension: Secondary | ICD-10-CM | POA: Insufficient documentation

## 2011-08-11 DIAGNOSIS — R059 Cough, unspecified: Secondary | ICD-10-CM | POA: Insufficient documentation

## 2011-08-11 DIAGNOSIS — I2510 Atherosclerotic heart disease of native coronary artery without angina pectoris: Secondary | ICD-10-CM | POA: Insufficient documentation

## 2011-08-11 DIAGNOSIS — R109 Unspecified abdominal pain: Secondary | ICD-10-CM | POA: Insufficient documentation

## 2011-08-11 LAB — URINALYSIS, ROUTINE W REFLEX MICROSCOPIC
Glucose, UA: 100 mg/dL — AB
Leukocytes, UA: NEGATIVE
Specific Gravity, Urine: 1.025 (ref 1.005–1.030)
Urobilinogen, UA: 0.2 mg/dL (ref 0.0–1.0)

## 2011-08-11 LAB — CBC
MCH: 28.8 pg (ref 26.0–34.0)
MCHC: 33.6 g/dL (ref 30.0–36.0)
MCV: 85.7 fL (ref 78.0–100.0)
Platelets: 241 10*3/uL (ref 150–400)
RDW: 13.9 % (ref 11.5–15.5)

## 2011-08-11 LAB — DIFFERENTIAL
Basophils Absolute: 0 10*3/uL (ref 0.0–0.1)
Eosinophils Absolute: 0.1 10*3/uL (ref 0.0–0.7)
Eosinophils Relative: 1 % (ref 0–5)

## 2011-08-11 LAB — URINE MICROSCOPIC-ADD ON

## 2011-08-11 LAB — COMPREHENSIVE METABOLIC PANEL
ALT: 17 U/L (ref 0–35)
AST: 21 U/L (ref 0–37)
Calcium: 9.2 mg/dL (ref 8.4–10.5)
Creatinine, Ser: 1.32 mg/dL — ABNORMAL HIGH (ref 0.50–1.10)
GFR calc Af Amer: 43 mL/min — ABNORMAL LOW (ref >90)
Sodium: 139 mEq/L (ref 135–145)
Total Protein: 6.9 g/dL (ref 6.0–8.3)

## 2011-08-11 MED ORDER — ONDANSETRON 8 MG PO TBDP: 8.0000 mg | ORAL_TABLET | Freq: Three times a day (TID) | ORAL | Status: AC | PRN

## 2011-08-11 MED ORDER — SODIUM CHLORIDE 0.9 % IV SOLN
INTRAVENOUS | Status: DC
  Administered 2011-08-11: 19:00:00 via INTRAVENOUS

## 2011-08-11 MED ORDER — ONDANSETRON HCL 4 MG/2ML IJ SOLN
4.0000 mg | Freq: Once | INTRAMUSCULAR | Status: AC
  Administered 2011-08-11: 4 mg via INTRAVENOUS
  Filled 2011-08-11: qty 2

## 2011-08-11 MED ORDER — FENTANYL CITRATE 0.05 MG/ML IJ SOLN
50.0000 ug | Freq: Once | INTRAMUSCULAR | Status: AC
  Administered 2011-08-11: 50 ug via INTRAVENOUS
  Filled 2011-08-11: qty 2

## 2011-08-11 MED ORDER — SODIUM CHLORIDE 0.9 % IV BOLUS (SEPSIS)
500.0000 mL | Freq: Once | INTRAVENOUS | Status: AC
  Administered 2011-08-11: 500 mL via INTRAVENOUS

## 2011-08-11 NOTE — ED Notes (Signed)
Pt c/o lower abd pain, N/V/D x1 day, pt reports unable to keep food or fluid down, states "I vomit my medicine back up."

## 2011-08-11 NOTE — ED Notes (Signed)
Reports onset of n/v/d yesterday. No acute distress noted at triage.

## 2011-08-11 NOTE — ED Provider Notes (Signed)
History     CSN: [id number]  Arrival date & time 08/11/11  1610   First MD Initiated Contact with Patient 08/11/11 1749      Chief Complaint  Patient presents with  . Emesis  . Diarrhea    (Consider location/radiation/quality/duration/timing/severity/associated sxs/prior treatment) Patient is a 78 y.o. female presenting with vomiting and diarrhea. The history is provided by the patient.  Emesis  This is a new problem. Associated symptoms include abdominal pain, cough and diarrhea. Pertinent negatives include no headaches.  Diarrhea The primary symptoms include fatigue, abdominal pain, nausea, vomiting and diarrhea. Primary symptoms do not include rash.  The illness does not include back pain.   patient had nausea vomiting diarrhea that started yesterday. She states she's mild abdominal cramping with it. She states she feels fatigued. No dysuria. She has had a little cough. No chest pain. She states she is hungry but has not been able to eat because of it come right back out. The pain is dull and crampy. She has not had any known sick contacts.  Past Medical History  Diagnosis Date  . Uterine cancer   . Diabetes mellitus     type II; peripheral neuropathy  . Hypertension   . GERD (gastroesophageal reflux disease)   . Peripheral vascular disease   . Obesity   . Dyslipidemia   . Diastolic CHF, chronic     EF 50-55%, mild LVH and grade 1 diast. Dysfxn  . Osteoarthritis cervical spine   . DM retinopathy   . Sleep apnea     with CPAP  . History of shingles   . COPD (chronic obstructive pulmonary disease)   . Depression   . Peptic ulcer disease     duodenal  . Peptic stricture of esophagus   . Hiatal hernia   . Diverticulosis   . Arthritis   . ASCVD (arteriosclerotic cardiovascular disease)     on MRI brin  . CAD (coronary artery disease)     a. s/p multiple caths with nonobs CAD;   b. cath 1/10: pLAD 20%, mLAD 40%, pCFX 20%, mCFX 40%, pRCA 60-70%  . Non-ischemic  cardiomyopathy   . Asthma     Mild  . Pruritic condition     Idiopathic  . Hyperlipidemia   . Zoster   . Noncompliance     Past Surgical History  Procedure Date  . Tubal ligation 1967  . Knee arthroscopy 10/1998    Left  . Craniotomy 1997    Left for SDH  . Cataract extraction, bilateral 2005  . Hernia repair   . Esophagogastroduodenoscopy 04/04/2004  . Spine surgery     C-spine and lumbar surgery  . Cholecystectomy 2010  . Cardiac catheterization   . Dexa 7/05  . Abdominal hysterectomy     Family History  Problem Relation Age of Onset  . Cancer Mother     "Stomach" Cancer  . Diabetes Mother   . Heart disease Mother   . Stomach cancer Mother   . Lymphoma Father   . Kidney disease Paternal Grandmother   . Asthma Other     History  Substance Use Topics  . Smoking status: Never Smoker   . Smokeless tobacco: Not on file  . Alcohol Use: Yes     rare    OB History    Grav Para Term Preterm Abortions TAB SAB Ect Mult Living                  Review of  Systems  Constitutional: Positive for fatigue. Negative for activity change and appetite change.  HENT: Negative for neck stiffness.   Eyes: Negative for pain.  Respiratory: Positive for cough. Negative for chest tightness and shortness of breath.   Cardiovascular: Negative for chest pain and leg swelling.  Gastrointestinal: Positive for nausea, vomiting, abdominal pain and diarrhea.  Genitourinary: Negative for flank pain.  Musculoskeletal: Negative for back pain.  Skin: Negative for rash.  Neurological: Negative for weakness, numbness and headaches.  Psychiatric/Behavioral: Negative for behavioral problems.    Allergies  Iohexol  Home Medications   Current Outpatient Rx  Name Route Sig Dispense Refill  . ACETAMINOPHEN 500 MG PO TABS Oral Take 1,000 mg by mouth every 6 (six) hours as needed. pain    . ALBUTEROL SULFATE HFA 108 (90 BASE) MCG/ACT IN AERS Inhalation Inhale 2 puffs into the lungs every 4  (four) hours as needed.    . ASPIRIN 81 MG PO TABS Oral Take 81 mg by mouth daily.     Marland Kitchen EZETIMIBE 10 MG PO TABS Oral Take 10 mg by mouth daily.     Marland Kitchen FLUTICASONE-SALMETEROL 100-50 MCG/DOSE IN AEPB      . FUROSEMIDE 80 MG PO TABS Oral Take 160 mg by mouth 2 (two) times daily.    Marland Kitchen HYDRALAZINE HCL 25 MG PO TABS      . HYDROCODONE-ACETAMINOPHEN 10-325 MG PO TABS Oral Take 0.5-1 tablets by mouth every 4 (four) hours as needed. For pain     . INSULIN ISOPHANE HUMAN 100 UNIT/ML [place] SUSP Subcutaneous Inject 40-75 Units into the skin 2 (two) times daily. 75 units in the morning and 40 units in the evening    . ISOSORBIDE DINITRATE 30 MG PO TABS Oral Take 30 mg by mouth 2 (two) times daily.     Marland Kitchen LABETALOL HCL 200 MG PO TABS      . LATANOPROST 0.005 % OP SOLN Both Eyes Place 1 drop into both eyes at bedtime.    Marland Kitchen METOLAZONE 5 MG PO TABS Oral Take 5 mg by mouth daily as needed. If blood pressure over 135, then takes    . MOMETASONE FUROATE 50 MCG/ACT NA SUSP  2 sprays. 2 sprays each nostril each am    . NITROGLYCERIN 0.4 MG SL SUBL Sublingual Place 0.4 mg under the tongue every 5 (five) minutes as needed. For chest pain/MAX 3 doses    . ONDANSETRON HCL 4 MG PO TABS Oral Take 4 mg by mouth every 6 (six) hours as needed. For nausea     . POTASSIUM CHLORIDE CRYS ER 20 MEQ PO TBCR Oral Take 20 mEq by mouth 3 (three) times daily.    Marland Kitchen ROSUVASTATIN CALCIUM 40 MG PO TABS Oral Take 40 mg by mouth every morning.     Marland Kitchen TRAMADOL HCL 50 MG PO TABS Oral Take 50 mg by mouth every 6 (six) hours as needed. For pain    . VITAMIN D (ERGOCALCIFEROL) 50000 UNITS PO CAPS Oral Take 50,000 Units by mouth 2 (two) times a week. Monday and Thursday    . ONDANSETRON 8 MG PO TBDP Oral Take 1 tablet (8 mg total) by mouth every 8 (eight) hours as needed for nausea. 20 tablet 0    BP 139/73  Pulse 90  Temp(Src) 99.4 F (37.4 C) (Oral)  Resp 18  SpO2 100%  Physical Exam  Nursing note and vitals reviewed. Constitutional: She  is oriented to person, place, and time. She appears well-developed and  well-nourished.  HENT:  Head: Normocephalic and atraumatic.  Eyes: EOM are normal. Pupils are equal, round, and reactive to light.  Neck: Normal range of motion. Neck supple.  Cardiovascular: Normal rate, regular rhythm and normal heart sounds.   No murmur heard. Pulmonary/Chest: Effort normal and breath sounds normal. No respiratory distress. She has no wheezes. She has no rales.  Abdominal: Soft. Bowel sounds are normal. She exhibits no distension. There is tenderness. There is no rebound and no guarding.       Minimal diffuse abdominal tenderness without rebound or guarding.  Musculoskeletal: Normal range of motion.  Neurological: She is alert and oriented to person, place, and time. No cranial nerve deficit.  Skin: Skin is warm and dry.  Psychiatric: She has a normal mood and affect. Her speech is normal.    ED Course  Procedures (including critical care time)  Labs Reviewed  CBC - Abnormal; Notable for the following:    RBC 5.25 (*)    Hemoglobin 15.1 (*)    All other components within normal limits  COMPREHENSIVE METABOLIC PANEL - Abnormal; Notable for the following:    Glucose, Bld 203 (*)    Creatinine, Ser 1.32 (*)    GFR calc non Af Amer 37 (*)    GFR calc Af Amer 43 (*)    All other components within normal limits  URINALYSIS, ROUTINE W REFLEX MICROSCOPIC - Abnormal; Notable for the following:    Glucose, UA 100 (*)    Hgb urine dipstick SMALL (*)    Bilirubin Urine SMALL (*)    Ketones, ur 15 (*)    Protein, ur >300 (*)    All other components within normal limits  URINE MICROSCOPIC-ADD ON - Abnormal; Notable for the following:    Squamous Epithelial / LPF FEW (*)    Bacteria, UA FEW (*)    Casts HYALINE CASTS (*)    All other components within normal limits  DIFFERENTIAL  LIPASE, BLOOD  URINE CULTURE   Dg Abd Acute W/chest  08/11/2011  *RADIOLOGY REPORT*  Clinical Data: Nausea.   Vomiting.  Diarrhea.  Cough.  Coronary artery disease.  COPD.  Hypertension.  Diabetes.  Asthma.  ACUTE ABDOMEN SERIES (ABDOMEN 2 VIEW & CHEST 1 VIEW)  Comparison: Multiple exams, including 06/27/2011 and 09/17/2010  Findings: Cardiomegaly noted with atherosclerotic calcification of the aortic arch.  The lungs appear clear.  No free intraperitoneal gas is evident beneath the hemidiaphragms. There is considerable levoconvex lumbar scoliosis.  Scattered air-fluid levels in nondilated bowel noted, nonspecific. Cannot exclude small bowel wall thickening in the left abdomen.  Deformity the right iliac crest is chronic.  Mild bilateral acetabular protrusio noted.  IMPRESSION:  1.  Scattered air-fluid levels, without dilated bowel but with potential bowel wall small bowel wall thickening in the left lateral abdomen.  This could reflect enteritis or ileus.  The lack of dilated bowel argues against obstruction. 2.  Lumbar scoliosis. 3.  Chronic deformity of the right iliac bone. 4.  Acetabular protrusio. 5.  Cardiomegaly. 6.  Atherosclerosis.  Original Report Authenticated By: Dellia Cloud, M.D.     1. Nausea vomiting and diarrhea       MDM  Nausea vomiting diarrhea. Lab work is overall reassuring. She does have some bacteria in the urine without white cells. Urine culture was sent. She'll be discharged home antibiotics. She's tolerated orals here.        Juliet Rude. Rubin Payor, MD 08/11/11 2115

## 2011-08-11 NOTE — Discharge Instructions (Signed)
Diet for Diarrhea, Adult Having frequent, runny stools (diarrhea) has many causes. Diarrhea may be caused or worsened by food or drink. Diarrhea may be relieved by changing your diet. IF YOU ARE NOT TOLERATING SOLID FOODS:  Drink enough water and fluids to keep your urine clear or pale yellow.   Avoid sugary drinks and sodas as well as milk-based beverages.   Avoid beverages containing caffeine and alcohol.   You may try rehydrating beverages. You can make your own by following this recipe:    tsp table salt.    tsp baking soda.   ? tsp salt substitute (potassium chloride).   1 tbs + 1 tsp sugar.   1 qt water.  As your stools become more solid, you can start eating solid foods. Add foods one at a time. If a certain food causes your diarrhea to get worse, avoid that food and try other foods. A low fiber, low-fat, and lactose-free diet is recommended. Small, frequent meals may be better tolerated.  Starches  Allowed:  White, French, and pita breads, plain rolls, buns, bagels. Plain muffins, matzo. Soda, saltine, or graham crackers. Pretzels, melba toast, zwieback. Cooked cereals made with water: cornmeal, farina, cream cereals. Dry cereals: refined corn, wheat, rice. Potatoes prepared any way without skins, refined macaroni, spaghetti, noodles, refined rice.   Avoid:  Bread, rolls, or crackers made with whole wheat, multi-grains, rye, bran seeds, nuts, or coconut. Corn tortillas or taco shells. Cereals containing whole grains, multi-grains, bran, coconut, nuts, or raisins. Cooked or dry oatmeal. Coarse wheat cereals, granola. Cereals advertised as "high-fiber." Potato skins. Whole grain pasta, wild or brown rice. Popcorn. Sweet potatoes/yams. Sweet rolls, doughnuts, waffles, pancakes, sweet breads.  Vegetables  Allowed: Strained tomato and vegetable juices. Most well-cooked and canned vegetables without seeds. Fresh: Tender lettuce, cucumber without the skin, cabbage, spinach, bean  sprouts.   Avoid: Fresh, cooked, or canned: Artichokes, baked beans, beet greens, broccoli, Brussels sprouts, corn, kale, legumes, peas, sweet potatoes. Cooked: Green or red cabbage, spinach. Avoid large servings of any vegetables, because vegetables shrink when cooked, and they contain more fiber per serving than fresh vegetables.  Fruit  Allowed: All fruit juices except prune juice. Cooked or canned: Apricots, applesauce, cantaloupe, cherries, fruit cocktail, grapefruit, grapes, kiwi, mandarin oranges, peaches, pears, plums, watermelon. Fresh: Apples without skin, ripe banana, grapes, cantaloupe, cherries, grapefruit, peaches, oranges, plums. Keep servings limited to  cup or 1 piece.   Avoid: Fresh: Apple with skin, apricots, mango, pears, raspberries, strawberries. Prune juice, stewed or dried prunes. Dried fruits, raisins, dates. Large servings of all fresh fruits.  Meat and Meat Substitutes  Allowed: Ground or well-cooked tender beef, ham, veal, lamb, pork, or poultry. Eggs, plain cheese. Fish, oysters, shrimp, lobster, other seafoods. Liver, organ meats.   Avoid: Tough, fibrous meats with gristle. Peanut butter, smooth or chunky. Cheese, nuts, seeds, legumes, dried peas, beans, lentils.  Milk  Allowed: Yogurt, lactose-free milk, kefir, drinkable yogurt, buttermilk, soy milk.   Avoid: Milk, chocolate milk, beverages made with milk, such as milk shakes.  Soups  Allowed: Bouillon, broth, or soups made from allowed foods. Any strained soup.   Avoid: Soups made from vegetables that are not allowed, cream or milk-based soups.  Desserts and Sweets  Allowed: Sugar-free gelatin, sugar-free frozen ice pops made without sugar alcohol.   Avoid: Plain cakes and cookies, pie made with allowed fruit, pudding, custard, cream pie. Gelatin, fruit, ice, sherbet, frozen ice pops. Ice cream, ice milk without nuts. Plain hard candy,   honey, jelly, molasses, syrup, sugar, chocolate syrup, gumdrops,  marshmallows.  Fats and Oils  Allowed: Avoid any fats and oils.   Avoid: Seeds, nuts, olives, avocados. Margarine, butter, cream, mayonnaise, salad oils, plain salad dressings made from allowed foods. Plain gravy, crisp bacon without rind.  Beverages  Allowed: Water, decaffeinated teas, oral rehydration solutions, sugar-free beverages.   Avoid: Fruit juices, caffeinated beverages (coffee, tea, soda or pop), alcohol, sports drinks, or lemon-lime soda or pop.  Condiments  Allowed: Ketchup, mustard, horseradish, vinegar, cream sauce, cheese sauce, cocoa powder. Spices in moderation: allspice, basil, bay leaves, celery powder or leaves, cinnamon, cumin powder, curry powder, ginger, mace, marjoram, onion or garlic powder, oregano, paprika, parsley flakes, ground pepper, rosemary, sage, savory, tarragon, thyme, turmeric.   Avoid: Coconut, honey.  Weight Monitoring: Weigh yourself every day. You should weigh yourself in the morning after you urinate and before you eat breakfast. Wear the same amount of clothing when you weigh yourself. Record your weight daily. Bring your recorded weights to your clinic visits. Tell your caregiver right away if you have gained 3 lb/1.4 kg or more in 1 day, 5 lb/2.3 kg in a week, or whatever amount you were told to report. SEEK IMMEDIATE MEDICAL CARE IF:   You are unable to keep fluids down.   You start to throw up (vomit) or diarrhea keeps coming back (persistent).   Abdominal pain develops, increases, or can be felt in one place (localizes).   You have an oral temperature above 102 F (38.9 C), not controlled by medicine.   Diarrhea contains blood or mucus.   You develop excessive weakness, dizziness, fainting, or extreme thirst.  MAKE SURE YOU:   Understand these instructions.   Will watch your condition.   Will get help right away if you are not doing well or get worse.  Document Released: 08/24/2003 Document Revised: 02/13/2011 Document Reviewed:  12/15/2008 [employer] Patient Information 2012 [employer].Nausea and Vomiting Nausea is a sick feeling that often comes before throwing up (vomiting). Vomiting is a reflex where stomach contents come out of your mouth. Vomiting can cause severe loss of body fluids (dehydration). Children and elderly adults can become dehydrated quickly, especially if they also have diarrhea. Nausea and vomiting are symptoms of a condition or disease. It is important to find the cause of your symptoms. CAUSES   Direct irritation of the stomach lining. This irritation can result from increased acid production (gastroesophageal reflux disease), infection, food poisoning, taking certain medicines (such as nonsteroidal anti-inflammatory drugs), alcohol use, or tobacco use.   Signals from the brain.These signals could be caused by a headache, heat exposure, an inner ear disturbance, increased pressure in the brain from injury, infection, a tumor, or a concussion, pain, emotional stimulus, or metabolic problems.   An obstruction in the gastrointestinal tract (bowel obstruction).   Illnesses such as diabetes, hepatitis, gallbladder problems, appendicitis, kidney problems, cancer, sepsis, atypical symptoms of a heart attack, or eating disorders.   Medical treatments such as chemotherapy and radiation.   Receiving medicine that makes you sleep (general anesthetic) during surgery.  DIAGNOSIS Your caregiver may ask for tests to be done if the problems do not improve after a few days. Tests may also be done if symptoms are severe or if the reason for the nausea and vomiting is not clear. Tests may include:  Urine tests.   Blood tests.   Stool tests.   Cultures (to look for evidence of infection).   X-rays or other imaging   studies.  Test results can help your caregiver make decisions about treatment or the need for additional tests. TREATMENT You need to stay well hydrated. Drink frequently but in small  amounts.You may wish to drink water, sports drinks, clear broth, or eat frozen ice pops or gelatin dessert to help stay hydrated.When you eat, eating slowly may help prevent nausea.There are also some antinausea medicines that may help prevent nausea. HOME CARE INSTRUCTIONS   Take all medicine as directed by your caregiver.   If you do not have an appetite, do not force yourself to eat. However, you must continue to drink fluids.   If you have an appetite, eat a normal diet unless your caregiver tells you differently.   Eat a variety of complex carbohydrates (rice, wheat, potatoes, bread), lean meats, yogurt, fruits, and vegetables.   Avoid high-fat foods because they are more difficult to digest.   Drink enough water and fluids to keep your urine clear or pale yellow.   If you are dehydrated, ask your caregiver for specific rehydration instructions. Signs of dehydration may include:   Severe thirst.   Dry lips and mouth.   Dizziness.   Dark urine.   Decreasing urine frequency and amount.   Confusion.   Rapid breathing or pulse.  SEEK IMMEDIATE MEDICAL CARE IF:   You have blood or brown flecks (like coffee grounds) in your vomit.   You have black or bloody stools.   You have a severe headache or stiff neck.   You are confused.   You have severe abdominal pain.   You have chest pain or trouble breathing.   You do not urinate at least once every 8 hours.   You develop cold or clammy skin.   You continue to vomit for longer than 24 to 48 hours.   You have a fever.  MAKE SURE YOU:   Understand these instructions.   Will watch your condition.   Will get help right away if you are not doing well or get worse.  Document Released: 06/03/2005 Document Revised: 02/13/2011 Document Reviewed: 10/31/2010 [employer] Patient Information 2012 [employer]. 

## 2011-08-11 NOTE — ED Notes (Signed)
Pt drinking at this time, unable to use the bathroom at this time. Pt is aware of the pending urine sample

## 2011-08-14 ENCOUNTER — Encounter: Payer: Self-pay | Admitting: Cardiology

## 2011-08-14 ENCOUNTER — Ambulatory Visit (INDEPENDENT_AMBULATORY_CARE_PROVIDER_SITE_OTHER): Payer: Medicare Other | Admitting: Cardiology

## 2011-08-14 VITALS — BP 152/96 | HR 72 | Ht 66.0 in | Wt 212.0 lb

## 2011-08-14 DIAGNOSIS — R0989 Other specified symptoms and signs involving the circulatory and respiratory systems: Secondary | ICD-10-CM

## 2011-08-14 DIAGNOSIS — J449 Chronic obstructive pulmonary disease, unspecified: Secondary | ICD-10-CM

## 2011-08-14 DIAGNOSIS — N259 Disorder resulting from impaired renal tubular function, unspecified: Secondary | ICD-10-CM

## 2011-08-14 DIAGNOSIS — I5032 Chronic diastolic (congestive) heart failure: Secondary | ICD-10-CM

## 2011-08-14 DIAGNOSIS — I509 Heart failure, unspecified: Secondary | ICD-10-CM

## 2011-08-14 DIAGNOSIS — I2510 Atherosclerotic heart disease of native coronary artery without angina pectoris: Secondary | ICD-10-CM

## 2011-08-14 DIAGNOSIS — I739 Peripheral vascular disease, unspecified: Secondary | ICD-10-CM

## 2011-08-14 DIAGNOSIS — I10 Essential (primary) hypertension: Secondary | ICD-10-CM

## 2011-08-14 NOTE — Assessment & Plan Note (Signed)
Stable. No change in medical program. Other cardiac conditions are also stable. I made no change today in her program. Close followup in 3 months. He will call the office if she starts to gain weight over her baseline of 218. She is metolazone as a backup.

## 2011-08-14 NOTE — Patient Instructions (Signed)
Your physician recommends that you continue on your current medications as directed. Please refer to the Current Medication list given to you today.  Your physician encouraged you to lose weight for better health.  Take your Metolazone daily only if your weight is over 221 pounds.  Your physician recommends that you schedule a follow-up appointment in: 3 months with Dr. Daleen Squibb

## 2011-08-14 NOTE — Progress Notes (Signed)
HPI Mrs. DilworthComes in for evaluation and management of her chronic diastolic heart failure. She was in the emergency room 3 days ago after having 2 days of diarrhea and nausea. She lost 9 pounds. She was given intravenous fluids and Zofran. He was sent her in the evening and not admitted.  Her weight is slowly coming back up. She has done remarkably well with her regimen. Her denies any chest pain or ischemic symptoms. He said orthopnea, PND or edema. Very compliant with her meds. The emergency room did not change any of her cardiac regimen.  Past Medical History  Diagnosis Date  . Uterine cancer   . Diabetes mellitus     type II; peripheral neuropathy  . Hypertension   . GERD (gastroesophageal reflux disease)   . Peripheral vascular disease   . Obesity   . Dyslipidemia   . Diastolic CHF, chronic     EF 50-55%, mild LVH and grade 1 diast. Dysfxn  . Osteoarthritis cervical spine   . DM retinopathy   . Sleep apnea     with CPAP  . History of shingles   . COPD (chronic obstructive pulmonary disease)   . Depression   . Peptic ulcer disease     duodenal  . Peptic stricture of esophagus   . Hiatal hernia   . Diverticulosis   . Arthritis   . ASCVD (arteriosclerotic cardiovascular disease)     on MRI brin  . CAD (coronary artery disease)     a. s/p multiple caths with nonobs CAD;   b. cath 1/10: pLAD 20%, mLAD 40%, pCFX 20%, mCFX 40%, pRCA 60-70%  . Non-ischemic cardiomyopathy   . Asthma     Mild  . Pruritic condition     Idiopathic  . Hyperlipidemia   . Zoster   . Noncompliance     Current Outpatient Prescriptions  Medication Sig Dispense Refill  . acetaminophen (TYLENOL) 500 MG tablet Take 1,000 mg by mouth every 6 (six) hours as needed. pain      . albuterol (PROVENTIL HFA;VENTOLIN HFA) 108 (90 BASE) MCG/ACT inhaler Inhale 2 puffs into the lungs every 4 (four) hours as needed.      Marland Kitchen aspirin 81 MG tablet Take 81 mg by mouth daily.       Marland Kitchen ezetimibe (ZETIA) 10 MG tablet  Take 10 mg by mouth daily.       . Fluticasone-Salmeterol (ADVAIR DISKUS) 100-50 MCG/DOSE AEPB       . furosemide (LASIX) 80 MG tablet Take 160 mg by mouth 2 (two) times daily.      Marland Kitchen gabapentin (NEURONTIN) 300 MG capsule as needed.      . hydrALAZINE (APRESOLINE) 25 MG tablet       . HYDROcodone-acetaminophen (NORCO) 10-325 MG per tablet Take 0.5-1 tablets by mouth every 4 (four) hours as needed. For pain       . insulin NPH (HUMULIN N,NOVOLIN N) 100 UNIT/ML injection Inject 40-75 Units into the skin 2 (two) times daily. 75 units in the morning and 40 units in the evening      . isosorbide dinitrate (ISORDIL) 30 MG tablet Take 30 mg by mouth 2 (two) times daily.       Marland Kitchen labetalol (NORMODYNE) 200 MG tablet       . latanoprost (XALATAN) 0.005 % ophthalmic solution Place 1 drop into both eyes at bedtime.      . metolazone (ZAROXOLYN) 5 MG tablet Take 5 mg by mouth daily as needed. Daily as  needed for weight gain over 221 pounds       . mometasone (NASONEX) 50 MCG/ACT nasal spray 2 sprays. 2 sprays each nostril each am      . nitroGLYCERIN (NITROSTAT) 0.4 MG SL tablet Place 0.4 mg under the tongue every 5 (five) minutes as needed. For chest pain/MAX 3 doses      . ondansetron (ZOFRAN) 4 MG tablet Take 4 mg by mouth every 6 (six) hours as needed. For nausea       . ondansetron (ZOFRAN-ODT) 8 MG disintegrating tablet Take 1 tablet (8 mg total) by mouth every 8 (eight) hours as needed for nausea.  20 tablet  0  . potassium chloride SA (K-DUR,KLOR-CON) 20 MEQ tablet Take 20 mEq by mouth 3 (three) times daily.      . rosuvastatin (CRESTOR) 40 MG tablet Take 40 mg by mouth every morning.       . traMADol (ULTRAM) 50 MG tablet Take 50 mg by mouth every 6 (six) hours as needed. For pain      . Vitamin D, Ergocalciferol, (DRISDOL) 50000 UNITS CAPS Take 50,000 Units by mouth 2 (two) times a week. Monday and Thursday        Allergies  Allergen Reactions  . Iohexol      Code: RASH, Desc: JENNIFER STATES  ON PT'S CHART ALLERGIC TO IV DYE 09/04/07/RM, Onset Date: [id number]     Family History  Problem Relation Age of Onset  . Cancer Mother     "Stomach" Cancer  . Diabetes Mother   . Heart disease Mother   . Stomach cancer Mother   . Lymphoma Father   . Kidney disease Paternal Grandmother   . Asthma Other     History   Social History  . Marital Status: Married    Spouse Name: N/A    Number of Children: 7  . Years of Education: N/A   Occupational History  . Retired    Social History Main Topics  . Smoking status: Never Smoker   . Smokeless tobacco: Not on file  . Alcohol Use: Yes     rare  . Drug Use: No  . Sexually Active:    Other Topics Concern  . Not on file   Social History Narrative  . No narrative on file    ROS ALL NEGATIVE EXCEPT THOSE NOTED IN HPI  PE  General Appearance: well developed, well nourished in no acute distress, obese HEENT: symmetrical face, PERRLA, good dentition  Neck: no JVD, thyromegaly, or adenopathy, trachea midline Chest: symmetric without deformity Cardiac: PMI Poorly appreciated, RRR, normal S1, S2, no gallop or murmur Lung: clear to ausculation and percussion Vascular: all pulses full without bruits  Abdominal: nondistended, nontender, good bowel sounds, no HSM, no bruits Extremities: no cyanosis, clubbing or edema, no sign of DVT, no varicosities  Skin: normal color, no rashes Neuro: alert and oriented x 3, non-focal Pysch: normal affect  EKG  BMET    Component Value Date/Time   NA 139 08/11/2011 1755   K 4.0 08/11/2011 1755   CL 103 08/11/2011 1755   CO2 22 08/11/2011 1755   GLUCOSE 203* 08/11/2011 1755   BUN 17 08/11/2011 1755   CREATININE 1.32* 08/11/2011 1755   CALCIUM 9.2 08/11/2011 1755   CALCIUM 9.7 01/22/2010 2344   GFRNONAA 37* 08/11/2011 1755   GFRAA 43* 08/11/2011 1755    Lipid Panel     Component Value Date/Time   CHOL  Value: 240  ATP III CLASSIFICATION:  <200     mg/dL   Desirable  161-096  mg/dL    Borderline High  >=045    mg/dL   High       * 09/23/8117 0605   TRIG 72 09/11/2010 0605   HDL 66 09/11/2010 0605   CHOLHDL 3.6 09/11/2010 0605   VLDL 14 09/11/2010 0605   LDLCALC  Value: 160        Total Cholesterol/HDL:CHD Risk Coronary Heart Disease Risk Table                     Men   Women  1/2 Average Risk   3.4   3.3  Average Risk       5.0   4.4  2 X Average Risk   9.6   7.1  3 X Average Risk  23.4   11.0        Use the calculated Patient Ratio above and the CHD Risk Table to determine the patient's CHD Risk.        ATP III CLASSIFICATION (LDL):  <100     mg/dL   Optimal  147-829  mg/dL   Near or Above                    Optimal  130-159  mg/dL   Borderline  562-130  mg/dL   High  >865     mg/dL   Very High* 7/84/6962 0605    CBC    Component Value Date/Time   WBC 5.8 08/11/2011 1755   RBC 5.25* 08/11/2011 1755   HGB 15.1* 08/11/2011 1755   HCT 45.0 08/11/2011 1755   PLT 241 08/11/2011 1755   MCV 85.7 08/11/2011 1755   MCH 28.8 08/11/2011 1755   MCHC 33.6 08/11/2011 1755   RDW 13.9 08/11/2011 1755   LYMPHSABS 0.9 08/11/2011 1755   MONOABS 0.4 08/11/2011 1755   EOSABS 0.1 08/11/2011 1755   BASOSABS 0.0 08/11/2011 1755

## 2011-08-20 ENCOUNTER — Ambulatory Visit: Payer: Medicare Other | Admitting: Endocrinology

## 2011-08-21 ENCOUNTER — Ambulatory Visit (INDEPENDENT_AMBULATORY_CARE_PROVIDER_SITE_OTHER): Payer: Medicare Other | Admitting: Endocrinology

## 2011-08-21 ENCOUNTER — Encounter: Payer: Self-pay | Admitting: Endocrinology

## 2011-08-21 ENCOUNTER — Other Ambulatory Visit (INDEPENDENT_AMBULATORY_CARE_PROVIDER_SITE_OTHER): Payer: Medicare Other

## 2011-08-21 VITALS — BP 152/86 | HR 88 | Temp 97.5°F | Wt 217.8 lb

## 2011-08-21 DIAGNOSIS — N058 Unspecified nephritic syndrome with other morphologic changes: Secondary | ICD-10-CM

## 2011-08-21 DIAGNOSIS — E1129 Type 2 diabetes mellitus with other diabetic kidney complication: Secondary | ICD-10-CM

## 2011-08-21 LAB — HEMOGLOBIN A1C: Hgb A1c MFr Bld: 10.6 % — ABNORMAL HIGH (ref 4.6–6.5)

## 2011-08-21 MED ORDER — HYDROCODONE-ACETAMINOPHEN 10-325 MG PO TABS: 0.5000 | ORAL_TABLET | ORAL | Status: DC | PRN

## 2011-08-21 NOTE — Progress Notes (Signed)
Subjective:    Patient ID: [name], female    DOB: Jan 23, 1930, 78 y.o.   MRN: [id number]  HPI The state of at least three ongoing medical problems is addressed today: Pt was seen in er a few days ago with abd pain and diarrhea.  sxs are much better now.   Pt returns for f/u of insulin-requiring DM (1985).  no cbg record, but states cbg's are well-controlled, except it was 59 yesterday am.   Pt has h/o renal insuff.  She reports good diuretic rx from meds.   Past Medical History  Diagnosis Date  . Uterine cancer   . Diabetes mellitus     type II; peripheral neuropathy  . Hypertension   . GERD (gastroesophageal reflux disease)   . Peripheral vascular disease   . Obesity   . Dyslipidemia   . Diastolic CHF, chronic     EF 50-55%, mild LVH and grade 1 diast. Dysfxn  . Osteoarthritis cervical spine   . DM retinopathy   . Sleep apnea     with CPAP  . History of shingles   . COPD (chronic obstructive pulmonary disease)   . Depression   . Peptic ulcer disease     duodenal  . Peptic stricture of esophagus   . Hiatal hernia   . Diverticulosis   . Arthritis   . ASCVD (arteriosclerotic cardiovascular disease)     on MRI brin  . CAD (coronary artery disease)     a. s/p multiple caths with nonobs CAD;   b. cath 1/10: pLAD 20%, mLAD 40%, pCFX 20%, mCFX 40%, pRCA 60-70%  . Non-ischemic cardiomyopathy   . Asthma     Mild  . Pruritic condition     Idiopathic  . Hyperlipidemia   . Zoster   . Noncompliance     Past Surgical History  Procedure Date  . Tubal ligation 1967  . Knee arthroscopy 10/1998    Left  . Craniotomy 1997    Left for SDH  . Cataract extraction, bilateral 2005  . Hernia repair   . Esophagogastroduodenoscopy 04/04/2004  . Spine surgery     C-spine and lumbar surgery  . Cholecystectomy 2010  . Cardiac catheterization   . Dexa 7/05  . Abdominal hysterectomy     History   Social History  . Marital Status: Married    Spouse Name: N/A   Number of Children: 7  . Years of Education: N/A   Occupational History  . Retired    Social History Main Topics  . Smoking status: Never Smoker   . Smokeless tobacco: Not on file  . Alcohol Use: Yes     rare  . Drug Use: No  . Sexually Active:    Other Topics Concern  . Not on file   Social History Narrative  . No narrative on file    Current Outpatient Prescriptions on File Prior to Visit  Medication Sig Dispense Refill  . acetaminophen (TYLENOL) 500 MG tablet Take 1,000 mg by mouth every 6 (six) hours as needed. pain      . albuterol (PROVENTIL HFA;VENTOLIN HFA) 108 (90 BASE) MCG/ACT inhaler Inhale 2 puffs into the lungs every 4 (four) hours as needed.      Marland Kitchen aspirin 81 MG tablet Take 81 mg by mouth daily.       Marland Kitchen ezetimibe (ZETIA) 10 MG tablet Take 10 mg by mouth daily.       . Fluticasone-Salmeterol (ADVAIR DISKUS) 100-50 MCG/DOSE AEPB       .  furosemide (LASIX) 80 MG tablet Take 160 mg by mouth 2 (two) times daily.      Marland Kitchen gabapentin (NEURONTIN) 300 MG capsule as needed.      . hydrALAZINE (APRESOLINE) 25 MG tablet       . insulin NPH (HUMULIN N,NOVOLIN N) 100 UNIT/ML injection 95 units in the morning and 30 units in the evening      . isosorbide dinitrate (ISORDIL) 30 MG tablet Take 30 mg by mouth 2 (two) times daily.       Marland Kitchen labetalol (NORMODYNE) 200 MG tablet       . latanoprost (XALATAN) 0.005 % ophthalmic solution Place 1 drop into both eyes at bedtime.      . metolazone (ZAROXOLYN) 5 MG tablet Take 5 mg by mouth daily as needed. Daily as needed for weight gain over 221 pounds       . mometasone (NASONEX) 50 MCG/ACT nasal spray 2 sprays. 2 sprays each nostril each am      . nitroGLYCERIN (NITROSTAT) 0.4 MG SL tablet Place 0.4 mg under the tongue every 5 (five) minutes as needed. For chest pain/MAX 3 doses      . ondansetron (ZOFRAN) 4 MG tablet Take 4 mg by mouth every 6 (six) hours as needed. For nausea       . potassium chloride SA (K-DUR,KLOR-CON) 20 MEQ tablet  Take 20 mEq by mouth 3 (three) times daily.      . rosuvastatin (CRESTOR) 40 MG tablet Take 40 mg by mouth every morning.       . traMADol (ULTRAM) 50 MG tablet Take 50 mg by mouth every 6 (six) hours as needed. For pain      . Vitamin D, Ergocalciferol, (DRISDOL) 50000 UNITS CAPS Take 50,000 Units by mouth 2 (two) times a week. Monday and Thursday        Allergies  Allergen Reactions  . Iohexol      Code: RASH, Desc: JENNIFER STATES ON PT'S CHART ALLERGIC TO IV DYE 09/04/07/RM, Onset Date: 16109604     Family History  Problem Relation Age of Onset  . Cancer Mother     "Stomach" Cancer  . Diabetes Mother   . Heart disease Mother   . Stomach cancer Mother   . Lymphoma Father   . Kidney disease Paternal Grandmother   . Asthma Other     BP 152/86  Pulse 88  Temp(Src) 97.5 F (36.4 C) (Oral)  Wt 217 lb 12.8 oz (98.793 kg)  SpO2 92%    Review of Systems Denies loc.  She has lost a few lbs, due to her efforts    Objective:   Physical Exam VITAL SIGNS:  See vs page GENERAL: no distress Pulses: dorsalis pedis intact bilat.   Feet: no deformity.  no ulcer on the feet.  feet are of normal color and temp.  1+ bilat leg edema Neuro: sensation is intact to touch on the feet.     Creat=1.3 Lab Results  Component Value Date   HGBA1C 10.6* 08/21/2011      Assessment & Plan:  DM.  therapy limited by noncompliance, and by her need for a simple, inexpensive insulin schedule.  i'll do the best i can. Renal insuff, improved Gastroenteritis, improved

## 2011-08-21 NOTE — Patient Instructions (Addendum)
blood tests are being requested for you today.  please call [phone] to hear your test results.  You will be prompted to enter the 9-digit "MRN" number that appears at the top left of this page, followed by #.  Then you will hear the message. Change insulin to 85 units in the morning, and 30 in the evening.   Please come back for a regular physical appointment in 3 months. check your blood sugar 2 times a day.  vary the time of day when you check, between before the 3 meals, and at bedtime.  also check if you have symptoms of your blood sugar being too high or too low.  please keep a record of the readings and bring it to your next appointment here.  please call us sooner if your blood sugar goes below 70, or if it stays over 200.   (update: i left message on phone-tree:  Increase am insulin to 95).

## 2011-10-10 ENCOUNTER — Other Ambulatory Visit: Payer: Self-pay | Admitting: *Deleted

## 2011-10-10 MED ORDER — TRAMADOL HCL 50 MG PO TABS: 50.0000 mg | ORAL_TABLET | ORAL | Status: DC | PRN

## 2011-10-10 NOTE — Telephone Encounter (Signed)
Rcd fax from [facility] for refill of Tramadol

## 2011-11-01 ENCOUNTER — Other Ambulatory Visit: Payer: Self-pay | Admitting: Endocrinology

## 2011-11-13 ENCOUNTER — Encounter: Payer: Self-pay | Admitting: Cardiology

## 2011-11-13 ENCOUNTER — Ambulatory Visit (INDEPENDENT_AMBULATORY_CARE_PROVIDER_SITE_OTHER): Payer: Medicare Other | Admitting: Cardiology

## 2011-11-13 VITALS — BP 122/74 | HR 57 | Ht 66.0 in | Wt 210.0 lb

## 2011-11-13 DIAGNOSIS — E1129 Type 2 diabetes mellitus with other diabetic kidney complication: Secondary | ICD-10-CM

## 2011-11-13 DIAGNOSIS — I5032 Chronic diastolic (congestive) heart failure: Secondary | ICD-10-CM

## 2011-11-13 DIAGNOSIS — R0989 Other specified symptoms and signs involving the circulatory and respiratory systems: Secondary | ICD-10-CM

## 2011-11-13 DIAGNOSIS — R609 Edema, unspecified: Secondary | ICD-10-CM

## 2011-11-13 DIAGNOSIS — N058 Unspecified nephritic syndrome with other morphologic changes: Secondary | ICD-10-CM

## 2011-11-13 DIAGNOSIS — I509 Heart failure, unspecified: Secondary | ICD-10-CM

## 2011-11-13 DIAGNOSIS — I10 Essential (primary) hypertension: Secondary | ICD-10-CM

## 2011-11-13 DIAGNOSIS — E785 Hyperlipidemia, unspecified: Secondary | ICD-10-CM

## 2011-11-13 DIAGNOSIS — G4733 Obstructive sleep apnea (adult) (pediatric): Secondary | ICD-10-CM

## 2011-11-13 DIAGNOSIS — I2510 Atherosclerotic heart disease of native coronary artery without angina pectoris: Secondary | ICD-10-CM

## 2011-11-13 DIAGNOSIS — I739 Peripheral vascular disease, unspecified: Secondary | ICD-10-CM

## 2011-11-13 NOTE — Assessment & Plan Note (Signed)
Stable. Much improved with her weight loss as well. No change in medical therapy.

## 2011-11-13 NOTE — Assessment & Plan Note (Signed)
Stable. Continue medical therapy and secondary prevention. 

## 2011-11-13 NOTE — Patient Instructions (Signed)
Your physician recommends that you continue on your current medications as directed. Please refer to the Current Medication list given to you today.  Your physician wants you to follow-up in: 3 months with Dr. Wall. You will receive a reminder letter in the mail two months in advance. If you don't receive a letter, please call our office to schedule the follow-up appointment.  

## 2011-11-13 NOTE — Progress Notes (Signed)
HPI [name] comes in today for close followup of her chronic diastolic heart failure, hypertension, obesity, and coronary artery disease.  He feels the best she's felt in some time. She just turned 78 years old. She has lost about 35 pounds now down to 207 pounds. Blood sugar and blood pressure well controlled. She denies any orthopnea, PND but has some pedal edema at the ankles. She denies any angina. She will get into an exercise program in [employer].  Past Medical History  Diagnosis Date  . Uterine cancer   . Diabetes mellitus     type II; peripheral neuropathy  . Hypertension   . GERD (gastroesophageal reflux disease)   . Peripheral vascular disease   . Obesity   . Dyslipidemia   . Diastolic CHF, chronic     EF 50-55%, mild LVH and grade 1 diast. Dysfxn  . Osteoarthritis cervical spine   . DM retinopathy   . Sleep apnea     with CPAP  . History of shingles   . COPD (chronic obstructive pulmonary disease)   . Depression   . Peptic ulcer disease     duodenal  . Peptic stricture of esophagus   . Hiatal hernia   . Diverticulosis   . Arthritis   . ASCVD (arteriosclerotic cardiovascular disease)     on MRI brin  . CAD (coronary artery disease)     a. s/p multiple caths with nonobs CAD;   b. cath 1/10: pLAD 20%, mLAD 40%, pCFX 20%, mCFX 40%, pRCA 60-70%  . Non-ischemic cardiomyopathy   . Asthma     Mild  . Pruritic condition     Idiopathic  . Hyperlipidemia   . Zoster   . Noncompliance     Current Outpatient Prescriptions  Medication Sig Dispense Refill  . albuterol (PROVENTIL HFA;VENTOLIN HFA) 108 (90 BASE) MCG/ACT inhaler Inhale 2 puffs into the lungs every 4 (four) hours as needed.      Marland Kitchen aspirin 81 MG tablet Take 81 mg by mouth daily.       Marland Kitchen ezetimibe (ZETIA) 10 MG tablet Take 10 mg by mouth daily.       . Fluticasone-Salmeterol (ADVAIR DISKUS) 100-50 MCG/DOSE AEPB       . furosemide (LASIX) 80 MG tablet Take 160 mg by mouth 2 (two) times daily.      Marland Kitchen  gabapentin (NEURONTIN) 300 MG capsule as needed.      . hydrALAZINE (APRESOLINE) 25 MG tablet TAKE ONE (1) TABLET(S) THREE (3) TIMES DAILY  90 tablet  5  . HYDROcodone-acetaminophen (NORCO) 10-325 MG per tablet Take 0.5-1 tablets by mouth every 4 (four) hours as needed for pain. For pain  50 tablet  5  . insulin NPH (HUMULIN N,NOVOLIN N) 100 UNIT/ML injection 95 units in the morning and 30 units in the evening      . isosorbide dinitrate (ISORDIL) 30 MG tablet Take 30 mg by mouth 2 (two) times daily.       Marland Kitchen labetalol (NORMODYNE) 200 MG tablet Take 100 mg by mouth 2 (two) times daily.       Marland Kitchen latanoprost (XALATAN) 0.005 % ophthalmic solution Place 1 drop into both eyes at bedtime.      . metolazone (ZAROXOLYN) 5 MG tablet Take 5 mg by mouth daily as needed. Daily as needed for weight gain over 221 pounds       . mometasone (NASONEX) 50 MCG/ACT nasal spray 2 sprays. 2 sprays each nostril each am      .  nitroGLYCERIN (NITROSTAT) 0.4 MG SL tablet Place 0.4 mg under the tongue every 5 (five) minutes as needed. For chest pain/MAX 3 doses      . ondansetron (ZOFRAN) 4 MG tablet Take 4 mg by mouth every 6 (six) hours as needed. For nausea       . potassium chloride SA (K-DUR,KLOR-CON) 20 MEQ tablet Take 20 mEq by mouth 3 (three) times daily.      . rosuvastatin (CRESTOR) 40 MG tablet Take 40 mg by mouth every morning.       . traMADol (ULTRAM) 50 MG tablet Take 1 tablet (50 mg total) by mouth every 4 (four) hours as needed for pain.  50 tablet  3  . Vitamin D, Ergocalciferol, (DRISDOL) 50000 UNITS CAPS Take 50,000 Units by mouth 2 (two) times a week. Monday and Thursday      . acetaminophen (TYLENOL) 500 MG tablet Take 1,000 mg by mouth every 6 (six) hours as needed. pain      . DISCONTD: hydrALAZINE (APRESOLINE) 25 MG tablet Take 25 mg by mouth 3 (three) times daily.         Allergies  Allergen Reactions  . Iohexol      Code: RASH, Desc: JENNIFER STATES ON PT'S CHART ALLERGIC TO IV DYE 09/04/07/RM,  Onset Date: 16109604     Family History  Problem Relation Age of Onset  . Cancer Mother     "Stomach" Cancer  . Diabetes Mother   . Heart disease Mother   . Stomach cancer Mother   . Lymphoma Father   . Kidney disease Paternal Grandmother   . Asthma Other     History   Social History  . Marital Status: Married    Spouse Name: N/A    Number of Children: 7  . Years of Education: N/A   Occupational History  . Retired    Social History Main Topics  . Smoking status: Never Smoker   . Smokeless tobacco: Not on file  . Alcohol Use: Yes     rare  . Drug Use: No  . Sexually Active:    Other Topics Concern  . Not on file   Social History Narrative  . No narrative on file    ROS ALL NEGATIVE EXCEPT THOSE NOTED IN HPI  PE  General Appearance: well developed, well nourished in no acute distress, obese but has lost significant weight HEENT: symmetrical face, PERRLA,   Neck: no JVD, thyromegaly, or adenopathy, trachea midline Chest: symmetric without deformity Cardiac: PMI non-displaced, RRR, normal S1, S2, no gallop or murmur Lung: clear to ausculation and percussion Vascular: Decreased in the lower extremities but present. Abdominal: nondistended, nontender, good bowel sounds, no HSM, no bruits Extremities: no cyanosis, clubbing  , ankle edema bilaterally, no sign of DVT, no varicosities  Skin: normal color, no rashes Neuro: alert and oriented x 3, non-focal Pysch: normal affect  EKG  BMET    Component Value Date/Time   NA 139 08/11/2011 1755   K 4.0 08/11/2011 1755   CL 103 08/11/2011 1755   CO2 22 08/11/2011 1755   GLUCOSE 203* 08/11/2011 1755   BUN 17 08/11/2011 1755   CREATININE 1.32* 08/11/2011 1755   CALCIUM 9.2 08/11/2011 1755   CALCIUM 9.7 01/22/2010 2344   GFRNONAA 37* 08/11/2011 1755   GFRAA 43* 08/11/2011 1755    Lipid Panel     Component Value Date/Time   CHOL  Value: 240        ATP III CLASSIFICATION:  <  200     mg/dL   Desirable  161-096  mg/dL    Borderline High  >=045    mg/dL   High       * 09/23/8117 0605   TRIG 72 09/11/2010 0605   HDL 66 09/11/2010 0605   CHOLHDL 3.6 09/11/2010 0605   VLDL 14 09/11/2010 0605   LDLCALC  Value: 160        Total Cholesterol/HDL:CHD Risk Coronary Heart Disease Risk Table                     Men   Women  1/2 Average Risk   3.4   3.3  Average Risk       5.0   4.4  2 X Average Risk   9.6   7.1  3 X Average Risk  23.4   11.0        Use the calculated Patient Ratio above and the CHD Risk Table to determine the patient's CHD Risk.        ATP III CLASSIFICATION (LDL):  <100     mg/dL   Optimal  147-829  mg/dL   Near or Above                    Optimal  130-159  mg/dL   Borderline  562-130  mg/dL   High  >865     mg/dL   Very High* 7/84/6962 0605    CBC    Component Value Date/Time   WBC 5.8 08/11/2011 1755   RBC 5.25* 08/11/2011 1755   HGB 15.1* 08/11/2011 1755   HCT 45.0 08/11/2011 1755   PLT 241 08/11/2011 1755   MCV 85.7 08/11/2011 1755   MCH 28.8 08/11/2011 1755   MCHC 33.6 08/11/2011 1755   RDW 13.9 08/11/2011 1755   LYMPHSABS 0.9 08/11/2011 1755   MONOABS 0.4 08/11/2011 1755   EOSABS 0.1 08/11/2011 1755   BASOSABS 0.0 08/11/2011 1755

## 2011-11-21 ENCOUNTER — Other Ambulatory Visit (INDEPENDENT_AMBULATORY_CARE_PROVIDER_SITE_OTHER): Payer: Medicare Other

## 2011-11-21 ENCOUNTER — Ambulatory Visit (INDEPENDENT_AMBULATORY_CARE_PROVIDER_SITE_OTHER): Payer: Medicare Other | Admitting: Endocrinology

## 2011-11-21 ENCOUNTER — Encounter: Payer: Self-pay | Admitting: Endocrinology

## 2011-11-21 VITALS — BP 124/82 | HR 75 | Temp 97.2°F | Ht 66.0 in | Wt 214.0 lb

## 2011-11-21 DIAGNOSIS — F329 Major depressive disorder, single episode, unspecified: Secondary | ICD-10-CM

## 2011-11-21 DIAGNOSIS — N058 Unspecified nephritic syndrome with other morphologic changes: Secondary | ICD-10-CM

## 2011-11-21 DIAGNOSIS — E1129 Type 2 diabetes mellitus with other diabetic kidney complication: Secondary | ICD-10-CM

## 2011-11-21 DIAGNOSIS — Z79899 Other long term (current) drug therapy: Secondary | ICD-10-CM

## 2011-11-21 DIAGNOSIS — N259 Disorder resulting from impaired renal tubular function, unspecified: Secondary | ICD-10-CM

## 2011-11-21 DIAGNOSIS — I10 Essential (primary) hypertension: Secondary | ICD-10-CM

## 2011-11-21 DIAGNOSIS — E785 Hyperlipidemia, unspecified: Secondary | ICD-10-CM

## 2011-11-21 LAB — BASIC METABOLIC PANEL
BUN: 22 mg/dL (ref 6–23)
CO2: 27 mEq/L (ref 19–32)
Calcium: 8.9 mg/dL (ref 8.4–10.5)
Creatinine, Ser: 1.1 mg/dL (ref 0.4–1.2)
Glucose, Bld: 66 mg/dL — ABNORMAL LOW (ref 70–99)

## 2011-11-21 LAB — URINALYSIS, ROUTINE W REFLEX MICROSCOPIC
Bilirubin Urine: NEGATIVE
Leukocytes, UA: NEGATIVE
Nitrite: NEGATIVE
Total Protein, Urine: 100
pH: 6 (ref 5.0–8.0)

## 2011-11-21 LAB — LIPID PANEL
Cholesterol: 187 mg/dL (ref 0–200)
HDL: 61.4 mg/dL (ref >39.00)
Total CHOL/HDL Ratio: 3
Triglycerides: 73 mg/dL (ref 0.0–149.0)

## 2011-11-21 LAB — HEPATIC FUNCTION PANEL
AST: 20 U/L (ref 0–37)
Albumin: 3.8 g/dL (ref 3.5–5.2)
Total Bilirubin: 0.6 mg/dL (ref 0.3–1.2)

## 2011-11-21 NOTE — Patient Instructions (Addendum)
blood tests are being requested for you today.  You will receive a letter with results. Change insulin to 100 units in the morning, and 20 in the evening.   Please come back for a follow-up appointment in 3 months check your blood sugar 2 times a day.  vary the time of day when you check, between before the 3 meals, and at bedtime.  also check if you have symptoms of your blood sugar being too high or too low.  please keep a record of the readings and bring it to your next appointment here.  please call us sooner if your blood sugar goes below 70, or if it stays over 200.   please consider these measures for your health:  minimize alcohol.  do not use tobacco products.  have a colonoscopy at least every 10 years from age 52.  Women should have an annual mammogram from age 33.  keep firearms safely stored.  always use seat belts.  have working smoke alarms in your home.  see an eye doctor and dentist regularly.  never drive under the influence of alcohol or drugs (including prescription drugs).  please let me know what your wishes would be, if artificial life support measures should become necessary.  it is critically important to prevent falling down (keep floor areas well-lit, dry, and free of loose objects.  If you have a cane, walker, or wheelchair, you should use it, even for short trips around the house.  Also, try not to rush) You should have a vaccine against shingles (a painful rash which results from the  chickenpox infection which most people had many years ago).  This vaccine reduces, but does not totally eliminate the risk of shingles.  Because this is a medicare part d benefit, you should get it at a pharmacy.

## 2011-11-21 NOTE — Progress Notes (Signed)
Subjective:    Patient ID: [name], female    DOB: 24-Jan-1930, 78 y.o.   MRN: [id number]  HPI here for regular wellness examination.  He's feeling pretty well in general, and says chronic med probs are stable, except as noted below Past Medical History  Diagnosis Date  . Uterine cancer   . Diabetes mellitus     type II; peripheral neuropathy  . Hypertension   . GERD (gastroesophageal reflux disease)   . Peripheral vascular disease   . Obesity   . Dyslipidemia   . Diastolic CHF, chronic     EF 50-55%, mild LVH and grade 1 diast. Dysfxn  . Osteoarthritis cervical spine   . DM retinopathy   . Sleep apnea     with CPAP  . History of shingles   . COPD (chronic obstructive pulmonary disease)   . Depression   . Peptic ulcer disease     duodenal  . Peptic stricture of esophagus   . Hiatal hernia   . Diverticulosis   . Arthritis   . ASCVD (arteriosclerotic cardiovascular disease)     on MRI brin  . CAD (coronary artery disease)     a. s/p multiple caths with nonobs CAD;   b. cath 1/10: pLAD 20%, mLAD 40%, pCFX 20%, mCFX 40%, pRCA 60-70%  . Non-ischemic cardiomyopathy   . Asthma     Mild  . Pruritic condition     Idiopathic  . Hyperlipidemia   . Zoster   . Noncompliance     Past Surgical History  Procedure Date  . Tubal ligation 1967  . Knee arthroscopy 10/1998    Left  . Craniotomy 1997    Left for SDH  . Cataract extraction, bilateral 2005  . Hernia repair   . Esophagogastroduodenoscopy 04/04/2004  . Spine surgery     C-spine and lumbar surgery  . Cholecystectomy 2010  . Cardiac catheterization   . Dexa 7/05  . Abdominal hysterectomy     History   Social History  . Marital Status: Married    Spouse Name: N/A    Number of Children: 7  . Years of Education: N/A   Occupational History  . Retired    Social History Main Topics  . Smoking status: Never Smoker   . Smokeless tobacco: Not on file  . Alcohol Use: Yes     rare  . Drug Use: No    . Sexually Active:    Other Topics Concern  . Not on file   Social History Narrative  . No narrative on file    Current Outpatient Prescriptions on File Prior to Visit  Medication Sig Dispense Refill  . acetaminophen (TYLENOL) 500 MG tablet Take 1,000 mg by mouth every 6 (six) hours as needed. pain      . albuterol (PROVENTIL HFA;VENTOLIN HFA) 108 (90 BASE) MCG/ACT inhaler Inhale 2 puffs into the lungs every 4 (four) hours as needed.      Marland Kitchen aspirin 81 MG tablet Take 81 mg by mouth daily.       Marland Kitchen ezetimibe (ZETIA) 10 MG tablet Take 10 mg by mouth daily.       . Fluticasone-Salmeterol (ADVAIR DISKUS) 100-50 MCG/DOSE AEPB       . furosemide (LASIX) 80 MG tablet Take 160 mg by mouth 2 (two) times daily.      Marland Kitchen gabapentin (NEURONTIN) 300 MG capsule as needed.      . hydrALAZINE (APRESOLINE) 25 MG tablet TAKE ONE (1) TABLET(S) THREE (  3) TIMES DAILY  90 tablet  5  . HYDROcodone-acetaminophen (NORCO) 10-325 MG per tablet Take 0.5-1 tablets by mouth every 4 (four) hours as needed for pain. For pain  50 tablet  5  . insulin NPH (HUMULIN N,NOVOLIN N) 100 UNIT/ML injection 100 units in the morning and 20 units in the evening      . isosorbide dinitrate (ISORDIL) 30 MG tablet Take 30 mg by mouth 2 (two) times daily.       Marland Kitchen labetalol (NORMODYNE) 200 MG tablet Take 100 mg by mouth 2 (two) times daily.       Marland Kitchen latanoprost (XALATAN) 0.005 % ophthalmic solution Place 1 drop into both eyes at bedtime.      . metolazone (ZAROXOLYN) 5 MG tablet Take 5 mg by mouth daily as needed. Daily as needed for weight gain over 221 pounds       . mometasone (NASONEX) 50 MCG/ACT nasal spray 2 sprays. 2 sprays each nostril each am      . nitroGLYCERIN (NITROSTAT) 0.4 MG SL tablet Place 0.4 mg under the tongue every 5 (five) minutes as needed. For chest pain/MAX 3 doses      . ondansetron (ZOFRAN) 4 MG tablet Take 4 mg by mouth every 6 (six) hours as needed. For nausea       . potassium chloride SA (K-DUR,KLOR-CON) 20  MEQ tablet Take 20 mEq by mouth 3 (three) times daily.      . rosuvastatin (CRESTOR) 40 MG tablet Take 40 mg by mouth every morning.       . traMADol (ULTRAM) 50 MG tablet Take 1 tablet (50 mg total) by mouth every 4 (four) hours as needed for pain.  50 tablet  3  . Vitamin D, Ergocalciferol, (DRISDOL) 50000 UNITS CAPS Take 50,000 Units by mouth 2 (two) times a week. Monday and Thursday        Allergies  Allergen Reactions  . Iohexol      Code: RASH, Desc: JENNIFER STATES ON PT'S CHART ALLERGIC TO IV DYE 09/04/07/RM, Onset Date: [id number]     Family History  Problem Relation Age of Onset  . Cancer Mother     "Stomach" Cancer  . Diabetes Mother   . Heart disease Mother   . Stomach cancer Mother   . Lymphoma Father   . Kidney disease Paternal Grandmother   . Asthma Other     BP 124/82  Pulse 75  Temp 97.2 F (36.2 C) (Oral)  Ht 5\' 6"  (1.676 m)  Wt 214 lb (97.07 kg)  BMI 34.54 kg/m2  SpO2 95%      Review of Systems  Constitutional: Negative for fever and unexpected weight change.  HENT: Negative for hearing loss.   Eyes: Negative for visual disturbance.  Respiratory: Negative for shortness of breath.   Cardiovascular: Negative for chest pain.  Gastrointestinal: Negative for anal bleeding.  Genitourinary: Negative for hematuria.  Musculoskeletal: Positive for gait problem.  Skin: Negative for rash.  Neurological: Negative for syncope and numbness.  Hematological: Bruises/bleeds easily.  Psychiatric/Behavioral: Negative for dysphoric mood.       Objective:   Physical Exam VS: see vs page GEN: no distress HEAD: head: no deformity eyes: no periorbital swelling, no proptosis external nose and ears are normal mouth: no lesion seen NECK: supple, thyroid is not enlarged CHEST WALL: no deformity LUNGS:  Clear to auscultation BREASTS:  sees gyn CV: reg rate and rhythm, no murmur ABD: abdomen is soft, nontender.  no hepatosplenomegaly.  not distended.  no  hernia GENITALIA/RECTAL: sees gyn.   MUSCULOSKELETAL: muscle bulk and strength are grossly normal.  no obvious joint swelling.  gait is steady, with a walker. EXTEMITIES: no deformity.  no ulcer on the feet.  feet are of normal color and temp.  Trace bilat leg edema.  There is bilateral onychomycosis NEURO:  cn 2-12 grossly intact.   readily moves all 4's.  sensation is intact to touch on the feet, but decreased from normal SKIN:  Normal texture and temperature.  No rash or suspicious lesion is visible.   NODES:  None palpable at the neck PSYCH: alert, oriented x3.  Does not appear anxious nor depressed.    Assessment & Plan:  Wellness visit today, with problems stable, except as noted.    SEPARATE EVALUATION FOLLOWS--EACH PROBLEM HERE IS NEW, NOT RESPONDING TO TREATMENT, OR POSES SIGNIFICANT RISK TO THE PATIENT'S HEALTH: HISTORY OF THE PRESENT ILLNESS: no cbg record, but states cbg's are elevated.  It is in general higher as the day goes on. She has been rx'ed several meds for dyslipidemia.  ?compliance PAST MEDICAL HISTORY reviewed and up to date today REVIEW OF SYSTEMS: denies hypoglycemia PHYSICAL EXAMINATION: VITAL SIGNS:  See vs page GENERAL: no distress PULSES: dorsalis pedis intact bilat.  no carotid bruit. LAB/XRAY RESULTS: Lab Results  Component Value Date   CHOL 187 11/21/2011   HDL 61.40 11/21/2011   LDLCALC 111* 11/21/2011   LDLDIRECT 123.0 07/06/2010   TRIG 73.0 11/21/2011   CHOLHDL 3 11/21/2011   Lab Results  Component Value Date   HGBA1C 10.3* 11/21/2011  IMPRESSION: DM.  therapy limited by noncompliance.  i'll do the best i can. Dyslipidemia, persistent despite rxs PLAN: See instruction page

## 2011-11-22 ENCOUNTER — Encounter: Payer: Self-pay | Admitting: Endocrinology

## 2011-11-26 ENCOUNTER — Telehealth: Payer: Self-pay | Admitting: *Deleted

## 2011-11-26 NOTE — Telephone Encounter (Signed)
Called pt to inform of lab results, pt informed (letter also mailed to pt). 

## 2012-02-20 ENCOUNTER — Encounter: Payer: Self-pay | Admitting: Endocrinology

## 2012-02-20 ENCOUNTER — Other Ambulatory Visit (INDEPENDENT_AMBULATORY_CARE_PROVIDER_SITE_OTHER): Payer: Medicare Other

## 2012-02-20 ENCOUNTER — Ambulatory Visit: Payer: Medicare Other | Admitting: Cardiology

## 2012-02-20 ENCOUNTER — Ambulatory Visit (INDEPENDENT_AMBULATORY_CARE_PROVIDER_SITE_OTHER): Payer: Medicare Other | Admitting: Endocrinology

## 2012-02-20 VITALS — BP 140/82 | HR 59 | Temp 97.6°F | Ht 66.0 in | Wt 211.0 lb

## 2012-02-20 DIAGNOSIS — Z79899 Other long term (current) drug therapy: Secondary | ICD-10-CM

## 2012-02-20 DIAGNOSIS — N259 Disorder resulting from impaired renal tubular function, unspecified: Secondary | ICD-10-CM

## 2012-02-20 DIAGNOSIS — E1129 Type 2 diabetes mellitus with other diabetic kidney complication: Secondary | ICD-10-CM

## 2012-02-20 DIAGNOSIS — N058 Unspecified nephritic syndrome with other morphologic changes: Secondary | ICD-10-CM

## 2012-02-20 DIAGNOSIS — E559 Vitamin D deficiency, unspecified: Secondary | ICD-10-CM | POA: Insufficient documentation

## 2012-02-20 NOTE — Progress Notes (Signed)
Subjective:    Patient ID: [name], female    DOB: 1929/10/21, 78 y.o.   MRN: [id number]  HPI Pt returns for f/u of insulin-requiring DM (dx'ed 1985; complicated by nephropathy, peripheral sensory neuropathy, CAD, and PAD).  no cbg record, but states cbg was low once at 3 AM.  It is highest in the afternoon (200).  pt states she feels well in general. Past Medical History  Diagnosis Date  . Uterine cancer   . Diabetes mellitus     type II; peripheral neuropathy  . Hypertension   . GERD (gastroesophageal reflux disease)   . Peripheral vascular disease   . Obesity   . Dyslipidemia   . Diastolic CHF, chronic     EF 50-55%, mild LVH and grade 1 diast. Dysfxn  . Osteoarthritis cervical spine   . DM retinopathy   . Sleep apnea     with CPAP  . History of shingles   . COPD (chronic obstructive pulmonary disease)   . Depression   . Peptic ulcer disease     duodenal  . Peptic stricture of esophagus   . Hiatal hernia   . Diverticulosis   . Arthritis   . ASCVD (arteriosclerotic cardiovascular disease)     on MRI brin  . CAD (coronary artery disease)     a. s/p multiple caths with nonobs CAD;   b. cath 1/10: pLAD 20%, mLAD 40%, pCFX 20%, mCFX 40%, pRCA 60-70%  . Non-ischemic cardiomyopathy   . Asthma     Mild  . Pruritic condition     Idiopathic  . Hyperlipidemia   . Zoster   . Noncompliance     Past Surgical History  Procedure Date  . Tubal ligation 1967  . Knee arthroscopy 10/1998    Left  . Craniotomy 1997    Left for SDH  . Cataract extraction, bilateral 2005  . Hernia repair   . Esophagogastroduodenoscopy 04/04/2004  . Spine surgery     C-spine and lumbar surgery  . Cholecystectomy 2010  . Cardiac catheterization   . Dexa 7/05  . Abdominal hysterectomy     History   Social History  . Marital Status: Married    Spouse Name: N/A    Number of Children: 7  . Years of Education: N/A   Occupational History  . Retired    Social History Main  Topics  . Smoking status: Never Smoker   . Smokeless tobacco: Not on file  . Alcohol Use: Yes     rare  . Drug Use: No  . Sexually Active:    Other Topics Concern  . Not on file   Social History Narrative  . No narrative on file    Current Outpatient Prescriptions on File Prior to Visit  Medication Sig Dispense Refill  . acetaminophen (TYLENOL) 500 MG tablet Take 1,000 mg by mouth every 6 (six) hours as needed. pain      . albuterol (PROVENTIL HFA;VENTOLIN HFA) 108 (90 BASE) MCG/ACT inhaler Inhale 2 puffs into the lungs every 4 (four) hours as needed.      Marland Kitchen aspirin 81 MG tablet Take 81 mg by mouth daily.       Marland Kitchen ezetimibe (ZETIA) 10 MG tablet Take 10 mg by mouth daily.       . Fluticasone-Salmeterol (ADVAIR DISKUS) 100-50 MCG/DOSE AEPB       . furosemide (LASIX) 80 MG tablet Take 160 mg by mouth 2 (two) times daily.      Marland Kitchen  gabapentin (NEURONTIN) 300 MG capsule as needed.      . hydrALAZINE (APRESOLINE) 25 MG tablet TAKE ONE (1) TABLET(S) THREE (3) TIMES DAILY  90 tablet  5  . isosorbide dinitrate (ISORDIL) 30 MG tablet Take 30 mg by mouth 2 (two) times daily.       Marland Kitchen labetalol (NORMODYNE) 200 MG tablet Take 100 mg by mouth 2 (two) times daily.       Marland Kitchen latanoprost (XALATAN) 0.005 % ophthalmic solution Place 1 drop into both eyes at bedtime.      . metolazone (ZAROXOLYN) 5 MG tablet Take 5 mg by mouth daily as needed. Daily as needed for weight gain over 221 pounds       . mometasone (NASONEX) 50 MCG/ACT nasal spray 2 sprays. 2 sprays each nostril each am      . nitroGLYCERIN (NITROSTAT) 0.4 MG SL tablet Place 0.4 mg under the tongue every 5 (five) minutes as needed. For chest pain/MAX 3 doses      . potassium chloride SA (K-DUR,KLOR-CON) 20 MEQ tablet Take 20 mEq by mouth 3 (three) times daily.      . rosuvastatin (CRESTOR) 40 MG tablet Take 40 mg by mouth every morning.       . insulin NPH (HUMULIN N) 100 UNIT/ML injection Inject 140 Units into the skin every morning.  5 vial  4    . traMADol (ULTRAM) 50 MG tablet Take 1 tablet (50 mg total) by mouth every 4 (four) hours as needed for pain.  50 tablet  3  . Vitamin D, Ergocalciferol, (DRISDOL) 50000 UNITS CAPS Take 50,000 Units by mouth 2 (two) times a week. Monday and Thursday        Allergies  Allergen Reactions  . Iohexol      Code: RASH, Desc: JENNIFER STATES ON PT'S CHART ALLERGIC TO IV DYE 09/04/07/RM, Onset Date: [id number]     Family History  Problem Relation Age of Onset  . Cancer Mother     "Stomach" Cancer  . Diabetes Mother   . Heart disease Mother   . Stomach cancer Mother   . Lymphoma Father   . Kidney disease Paternal Grandmother   . Asthma Other     BP 140/82  Pulse 59  Temp 97.6 F (36.4 C) (Oral)  Ht 5\' 6"  (1.676 m)  Wt 211 lb (95.709 kg)  BMI 34.06 kg/m2  SpO2 98%  Review of Systems Denies LOC.    Objective:   Physical Exam VITAL SIGNS:  See vs page. GENERAL: no distress. SKIN:  Insulin injection sites at the anterior thighs are normal.    Lab Results  Component Value Date   HGBA1C 10.9* 02/20/2012      Assessment & Plan:  DM: therapy limited by pt's need for a simple regimen.  She needs increased rx

## 2012-02-20 NOTE — Patient Instructions (Addendum)
blood tests are being requested for you today.  You will receive a letter with results. Change insulin to 120 units in the morning, and none in the evening.   Please come back for a follow-up appointment in 3 months.   check your blood sugar 2 times a day.  vary the time of day when you check, between before the 3 meals, and at bedtime.  also check if you have symptoms of your blood sugar being too high or too low.  please keep a record of the readings and bring it to your next appointment here.  please call us sooner if your blood sugar goes below 70, or if it stays over 200.     [facility] labs dr Eliott Nine

## 2012-02-21 ENCOUNTER — Telehealth: Payer: Self-pay | Admitting: *Deleted

## 2012-02-21 LAB — VITAMIN D 25 HYDROXY (VIT D DEFICIENCY, FRACTURES): Vit D, 25-Hydroxy: 33 ng/mL (ref 30–89)

## 2012-02-21 MED ORDER — INSULIN NPH (HUMAN) (ISOPHANE) 100 UNIT/ML ~~LOC~~ SUSP: 140.0000 [IU] | Freq: Every morning | SUBCUTANEOUS | Status: DC

## 2012-02-21 NOTE — Telephone Encounter (Signed)
Called pt to inform of lab results, pt informed, update rx for insulin sent to pharmacy (letter also mailed to pt).

## 2012-02-22 ENCOUNTER — Encounter: Payer: Self-pay | Admitting: Endocrinology

## 2012-02-24 ENCOUNTER — Ambulatory Visit (INDEPENDENT_AMBULATORY_CARE_PROVIDER_SITE_OTHER): Payer: Medicare Other | Admitting: Cardiology

## 2012-02-24 ENCOUNTER — Encounter: Payer: Self-pay | Admitting: Cardiology

## 2012-02-24 VITALS — BP 132/78 | HR 70 | Ht 66.0 in | Wt 213.0 lb

## 2012-02-24 DIAGNOSIS — R609 Edema, unspecified: Secondary | ICD-10-CM

## 2012-02-24 DIAGNOSIS — E1129 Type 2 diabetes mellitus with other diabetic kidney complication: Secondary | ICD-10-CM

## 2012-02-24 DIAGNOSIS — G4733 Obstructive sleep apnea (adult) (pediatric): Secondary | ICD-10-CM

## 2012-02-24 DIAGNOSIS — I10 Essential (primary) hypertension: Secondary | ICD-10-CM

## 2012-02-24 DIAGNOSIS — I2510 Atherosclerotic heart disease of native coronary artery without angina pectoris: Secondary | ICD-10-CM

## 2012-02-24 DIAGNOSIS — Z79899 Other long term (current) drug therapy: Secondary | ICD-10-CM

## 2012-02-24 DIAGNOSIS — R0989 Other specified symptoms and signs involving the circulatory and respiratory systems: Secondary | ICD-10-CM

## 2012-02-24 DIAGNOSIS — E785 Hyperlipidemia, unspecified: Secondary | ICD-10-CM

## 2012-02-24 DIAGNOSIS — N058 Unspecified nephritic syndrome with other morphologic changes: Secondary | ICD-10-CM

## 2012-02-24 DIAGNOSIS — I739 Peripheral vascular disease, unspecified: Secondary | ICD-10-CM

## 2012-02-24 DIAGNOSIS — I5032 Chronic diastolic (congestive) heart failure: Secondary | ICD-10-CM

## 2012-02-24 NOTE — Assessment & Plan Note (Signed)
Stable. Continue secondary preventative therapy. 

## 2012-02-24 NOTE — Progress Notes (Signed)
HPI [name] returns today for her complex cardiovascular issues. Her weight has been stable, blood pressures been under good control, and her blood sugars are better controlled than usual. She still has 2 pillow orthopnea but no significant edema.  She's very compliant with her meds.  Past Medical History  Diagnosis Date  . Uterine cancer   . Diabetes mellitus     type II; peripheral neuropathy  . Hypertension   . GERD (gastroesophageal reflux disease)   . Peripheral vascular disease   . Obesity   . Dyslipidemia   . Diastolic CHF, chronic     EF 50-55%, mild LVH and grade 1 diast. Dysfxn  . Osteoarthritis cervical spine   . DM retinopathy   . Sleep apnea     with CPAP  . History of shingles   . COPD (chronic obstructive pulmonary disease)   . Depression   . Peptic ulcer disease     duodenal  . Peptic stricture of esophagus   . Hiatal hernia   . Diverticulosis   . Arthritis   . ASCVD (arteriosclerotic cardiovascular disease)     on MRI brin  . CAD (coronary artery disease)     a. s/p multiple caths with nonobs CAD;   b. cath 1/10: pLAD 20%, mLAD 40%, pCFX 20%, mCFX 40%, pRCA 60-70%  . Non-ischemic cardiomyopathy   . Asthma     Mild  . Pruritic condition     Idiopathic  . Hyperlipidemia   . Zoster   . Noncompliance     Current Outpatient Prescriptions  Medication Sig Dispense Refill  . acetaminophen (TYLENOL) 500 MG tablet Take 1,000 mg by mouth every 6 (six) hours as needed. pain      . albuterol (PROVENTIL HFA;VENTOLIN HFA) 108 (90 BASE) MCG/ACT inhaler Inhale 2 puffs into the lungs every 4 (four) hours as needed.      Marland Kitchen aspirin 81 MG tablet Take 81 mg by mouth daily.       Marland Kitchen ezetimibe (ZETIA) 10 MG tablet Take 10 mg by mouth daily.       . Fluticasone-Salmeterol (ADVAIR DISKUS) 100-50 MCG/DOSE AEPB       . furosemide (LASIX) 80 MG tablet Take 160 mg by mouth 2 (two) times daily.      Marland Kitchen gabapentin (NEURONTIN) 300 MG capsule as needed.      . hydrALAZINE  (APRESOLINE) 25 MG tablet TAKE ONE (1) TABLET(S) THREE (3) TIMES DAILY  90 tablet  5  . insulin NPH (HUMULIN N) 100 UNIT/ML injection Inject 140 Units into the skin every morning.  5 vial  4  . isosorbide dinitrate (ISORDIL) 30 MG tablet Take 30 mg by mouth 2 (two) times daily.       Marland Kitchen labetalol (NORMODYNE) 200 MG tablet Take 100 mg by mouth 2 (two) times daily.       Marland Kitchen latanoprost (XALATAN) 0.005 % ophthalmic solution Place 1 drop into both eyes at bedtime.      . metolazone (ZAROXOLYN) 5 MG tablet Take 5 mg by mouth daily as needed. Daily as needed for weight gain over 221 pounds       . mometasone (NASONEX) 50 MCG/ACT nasal spray 2 sprays. 2 sprays each nostril each am      . nitroGLYCERIN (NITROSTAT) 0.4 MG SL tablet Place 0.4 mg under the tongue every 5 (five) minutes as needed. For chest pain/MAX 3 doses      . potassium chloride SA (K-DUR,KLOR-CON) 20 MEQ tablet Take 20 mEq  by mouth 3 (three) times daily.      . rosuvastatin (CRESTOR) 40 MG tablet Take 40 mg by mouth every morning.       . traMADol (ULTRAM) 50 MG tablet Take 1 tablet (50 mg total) by mouth every 4 (four) hours as needed for pain.  50 tablet  3  . Vitamin D, Ergocalciferol, (DRISDOL) 50000 UNITS CAPS Take 50,000 Units by mouth 2 (two) times a week. Monday and Thursday        Allergies  Allergen Reactions  . Iohexol      Code: RASH, Desc: JENNIFER STATES ON PT'S CHART ALLERGIC TO IV DYE 09/04/07/RM, Onset Date: [id number]     Family History  Problem Relation Age of Onset  . Cancer Mother     "Stomach" Cancer  . Diabetes Mother   . Heart disease Mother   . Stomach cancer Mother   . Lymphoma Father   . Kidney disease Paternal Grandmother   . Asthma Other     History   Social History  . Marital Status: Married    Spouse Name: N/A    Number of Children: 7  . Years of Education: N/A   Occupational History  . Retired    Social History Main Topics  . Smoking status: Never Smoker   . Smokeless tobacco: Not on  file  . Alcohol Use: Yes     rare  . Drug Use: No  . Sexually Active:    Other Topics Concern  . Not on file   Social History Narrative  . No narrative on file    ROS ALL NEGATIVE EXCEPT THOSE NOTED IN HPI  PE  General Appearance: well developed, well nourished in no acute distress, obese HEENT: symmetrical face, PERRLA, good dentition  Neck: no JVD, thyromegaly, or adenopathy, trachea midline Chest: symmetric without deformity Cardiac: PMI poorly appreciated, RRR, soft S1, S2, no gallop or murmur Lung: clear to ausculation and percussion Vascular: all pulses full without bruits  Abdominal: nondistended, nontender, good bowel sounds, no HSM, no bruits Extremities: no cyanosis, clubbing or edema, no sign of DVT, no varicosities  Skin: normal color, no rashes Neuro: alert and oriented x 3, non-focal Pysch: normal affect  EKG Not repeated  BMET    Component Value Date/Time   NA 143 11/21/2011 0957   K 3.9 11/21/2011 0957   CL 109 11/21/2011 0957   CO2 27 11/21/2011 0957   GLUCOSE 66* 11/21/2011 0957   BUN 22 11/21/2011 0957   CREATININE 1.1 11/21/2011 0957   CALCIUM 9.8 02/20/2012 1018   CALCIUM 8.9 11/21/2011 0957   GFRNONAA 37* 08/11/2011 1755   GFRAA 43* 08/11/2011 1755    Lipid Panel     Component Value Date/Time   CHOL 187 11/21/2011 0957   TRIG 73.0 11/21/2011 0957   HDL 61.40 11/21/2011 0957   CHOLHDL 3 11/21/2011 0957   VLDL 14.6 11/21/2011 0957   LDLCALC 111* 11/21/2011 0957    CBC    Component Value Date/Time   WBC 5.8 08/11/2011 1755   RBC 5.25* 08/11/2011 1755   HGB 15.1* 08/11/2011 1755   HCT 45.0 08/11/2011 1755   PLT 241 08/11/2011 1755   MCV 85.7 08/11/2011 1755   MCH 28.8 08/11/2011 1755   MCHC 33.6 08/11/2011 1755   RDW 13.9 08/11/2011 1755   LYMPHSABS 0.9 08/11/2011 1755   MONOABS 0.4 08/11/2011 1755   EOSABS 0.1 08/11/2011 1755   BASOSABS 0.0 08/11/2011 1755

## 2012-02-24 NOTE — Assessment & Plan Note (Signed)
Stable. Continue current medical therapy. I'll see back in the office in 3 months for close followup. She's very complicated high risk for possible admission. She's done remarkably well since her compliance is improved.

## 2012-02-24 NOTE — Assessment & Plan Note (Signed)
Well controlled 

## 2012-02-24 NOTE — Assessment & Plan Note (Signed)
Improved

## 2012-02-24 NOTE — Assessment & Plan Note (Signed)
Stable. Continue medical therapy 

## 2012-02-24 NOTE — Patient Instructions (Signed)
Return to see Dr. Daleen Squibb in 3 months.

## 2012-02-25 ENCOUNTER — Telehealth: Payer: Self-pay | Admitting: *Deleted

## 2012-02-25 NOTE — Telephone Encounter (Signed)
Called pt to inform of lab results, pt informed (letter also mailed to pt). 

## 2012-03-17 ENCOUNTER — Other Ambulatory Visit: Payer: Self-pay | Admitting: Endocrinology

## 2012-03-17 NOTE — Telephone Encounter (Signed)
Pt would like refill of true result test strips sent to her pharmacy, [facility] on [address]

## 2012-03-18 ENCOUNTER — Other Ambulatory Visit: Payer: Self-pay | Admitting: General Practice

## 2012-03-18 MED ORDER — GLUCOSE BLOOD VI STRP: ORAL_STRIP | Status: DC

## 2012-03-20 ENCOUNTER — Other Ambulatory Visit: Payer: Self-pay | Admitting: Endocrinology

## 2012-04-13 ENCOUNTER — Other Ambulatory Visit: Payer: Self-pay | Admitting: General Practice

## 2012-04-13 ENCOUNTER — Other Ambulatory Visit: Payer: Self-pay | Admitting: Endocrinology

## 2012-04-13 MED ORDER — HYDROCODONE-ACETAMINOPHEN 5-325 MG PO TABS: 1.0000 | ORAL_TABLET | ORAL | Status: AC | PRN

## 2012-04-13 NOTE — Telephone Encounter (Signed)
Received refill request for HYDROCODONE. Last filled on 07/07/11 and pt last seen on 02/20/12. Please advise.

## 2012-04-14 NOTE — Telephone Encounter (Signed)
Rx faxed to pharmacy # [phone]

## 2012-05-19 ENCOUNTER — Other Ambulatory Visit: Payer: Self-pay | Admitting: *Deleted

## 2012-05-20 MED ORDER — POTASSIUM CHLORIDE CRYS ER 20 MEQ PO TBCR: 20.0000 meq | EXTENDED_RELEASE_TABLET | Freq: Three times a day (TID) | ORAL | Status: DC

## 2012-05-20 MED ORDER — METOLAZONE 5 MG PO TABS: 5.0000 mg | ORAL_TABLET | Freq: Every day | ORAL | Status: DC | PRN

## 2012-05-21 ENCOUNTER — Ambulatory Visit (INDEPENDENT_AMBULATORY_CARE_PROVIDER_SITE_OTHER): Payer: Medicare Other | Admitting: Endocrinology

## 2012-05-21 ENCOUNTER — Encounter: Payer: Self-pay | Admitting: Endocrinology

## 2012-05-21 VITALS — BP 134/80 | HR 94 | Temp 97.4°F | Wt 218.0 lb

## 2012-05-21 DIAGNOSIS — E1129 Type 2 diabetes mellitus with other diabetic kidney complication: Secondary | ICD-10-CM

## 2012-05-21 DIAGNOSIS — N058 Unspecified nephritic syndrome with other morphologic changes: Secondary | ICD-10-CM

## 2012-05-21 MED ORDER — INSULIN NPH ISOPHANE & REGULAR (70-30) 100 UNIT/ML ~~LOC~~ SUSP: 120.0000 [IU] | Freq: Every day | SUBCUTANEOUS | Status: DC

## 2012-05-21 MED ORDER — GABAPENTIN 300 MG PO CAPS: 300.0000 mg | ORAL_CAPSULE | Freq: Every day | ORAL | Status: DC

## 2012-05-21 NOTE — Patient Instructions (Addendum)
Please change your current insulin to "70/30," 120 units each day, with breakfast. check your blood sugar twice a day.  vary the time of day when you check, between before the 3 meals, and at bedtime.  also check if you have symptoms of your blood sugar being too high or too low.  please keep a record of the readings and bring it to your next appointment here.  please call us sooner if your blood sugar goes below 70, or if you have a lot of readings over 200. Please come back for a follow-up appointment in 3 months.

## 2012-05-21 NOTE — Progress Notes (Signed)
Subjective:    Patient ID: [name], female    DOB: Dec 07, 1929, 78 y.o.   MRN: [id number]  HPI Pt returns for f/u of insulin-requiring DM (dx'ed 1985; complicated by nephropathy, peripheral sensory neuropathy, CAD, and PAD).  no cbg record, but states cbg's vary from 60-400.  It is in general higher as the day goes on.   Past Medical History  Diagnosis Date  . Uterine cancer   . Diabetes mellitus     type II; peripheral neuropathy  . Hypertension   . GERD (gastroesophageal reflux disease)   . Peripheral vascular disease   . Obesity   . Dyslipidemia   . Diastolic CHF, chronic     EF 50-55%, mild LVH and grade 1 diast. Dysfxn  . Osteoarthritis cervical spine   . DM retinopathy   . Sleep apnea     with CPAP  . History of shingles   . COPD (chronic obstructive pulmonary disease)   . Depression   . Peptic ulcer disease     duodenal  . Peptic stricture of esophagus   . Hiatal hernia   . Diverticulosis   . Arthritis   . ASCVD (arteriosclerotic cardiovascular disease)     on MRI brin  . CAD (coronary artery disease)     a. s/p multiple caths with nonobs CAD;   b. cath 1/10: pLAD 20%, mLAD 40%, pCFX 20%, mCFX 40%, pRCA 60-70%  . Non-ischemic cardiomyopathy   . Asthma     Mild  . Pruritic condition     Idiopathic  . Hyperlipidemia   . Zoster   . Noncompliance     Past Surgical History  Procedure Date  . Tubal ligation 1967  . Knee arthroscopy 10/1998    Left  . Craniotomy 1997    Left for SDH  . Cataract extraction, bilateral 2005  . Hernia repair   . Esophagogastroduodenoscopy 04/04/2004  . Spine surgery     C-spine and lumbar surgery  . Cholecystectomy 2010  . Cardiac catheterization   . Dexa 7/05  . Abdominal hysterectomy     History   Social History  . Marital Status: Married    Spouse Name: N/A    Number of Children: 7  . Years of Education: N/A   Occupational History  . Retired    Social History Main Topics  . Smoking status: Never  Smoker   . Smokeless tobacco: Not on file  . Alcohol Use: Yes     Comment: rare  . Drug Use: No  . Sexually Active:    Other Topics Concern  . Not on file   Social History Narrative  . No narrative on file    Current Outpatient Prescriptions on File Prior to Visit  Medication Sig Dispense Refill  . acetaminophen (TYLENOL) 500 MG tablet Take 1,000 mg by mouth every 6 (six) hours as needed. pain      . ADVAIR DISKUS 100-50 MCG/DOSE AEPB USE 1 PUFF TWICE DAILY  60 each  2  . albuterol (PROVENTIL HFA;VENTOLIN HFA) 108 (90 BASE) MCG/ACT inhaler Inhale 2 puffs into the lungs every 4 (four) hours as needed.      Marland Kitchen aspirin 81 MG tablet Take 81 mg by mouth daily.       Marland Kitchen ezetimibe (ZETIA) 10 MG tablet Take 10 mg by mouth daily.       . Fluticasone-Salmeterol (ADVAIR DISKUS) 100-50 MCG/DOSE AEPB       . furosemide (LASIX) 80 MG tablet Take 160 mg  by mouth 2 (two) times daily.      Marland Kitchen gabapentin (NEURONTIN) 300 MG capsule Take 1 capsule (300 mg total) by mouth at bedtime.  30 capsule  11  . glucose blood test strip Use as instructed  100 each  12  . hydrALAZINE (APRESOLINE) 25 MG tablet TAKE ONE (1) TABLET(S) THREE (3) TIMES DAILY  90 tablet  5  . isosorbide dinitrate (ISORDIL) 30 MG tablet Take 30 mg by mouth 2 (two) times daily.       Marland Kitchen labetalol (NORMODYNE) 200 MG tablet Take 100 mg by mouth 2 (two) times daily.       Marland Kitchen latanoprost (XALATAN) 0.005 % ophthalmic solution Place 1 drop into both eyes at bedtime.      . metolazone (ZAROXOLYN) 5 MG tablet Take 1 tablet (5 mg total) by mouth daily as needed. Daily as needed for weight gain over 221 pounds  30 tablet  3  . mometasone (NASONEX) 50 MCG/ACT nasal spray 2 sprays. 2 sprays each nostril each am      . nitroGLYCERIN (NITROSTAT) 0.4 MG SL tablet Place 0.4 mg under the tongue every 5 (five) minutes as needed. For chest pain/MAX 3 doses      . potassium chloride SA (K-DUR,KLOR-CON) 20 MEQ tablet Take 1 tablet (20 mEq total) by mouth 3 (three)  times daily.  90 tablet  5  . rosuvastatin (CRESTOR) 40 MG tablet Take 40 mg by mouth every morning.       . traMADol (ULTRAM) 50 MG tablet Take 1 tablet (50 mg total) by mouth every 4 (four) hours as needed for pain.  50 tablet  3  . Vitamin D, Ergocalciferol, (DRISDOL) 50000 UNITS CAPS Take 50,000 Units by mouth 2 (two) times a week. Monday and Thursday      . insulin NPH-insulin regular (NOVOLIN 70/30) (70-30) 100 UNIT/ML injection Inject 120 Units into the skin daily with breakfast.  40 mL  12    Allergies  Allergen Reactions  . Iohexol      Code: RASH, Desc: JENNIFER STATES ON PT'S CHART ALLERGIC TO IV DYE 09/04/07/RM, Onset Date: [id number]     Family History  Problem Relation Age of Onset  . Cancer Mother     "Stomach" Cancer  . Diabetes Mother   . Heart disease Mother   . Stomach cancer Mother   . Lymphoma Father   . Kidney disease Paternal Grandmother   . Asthma Other     BP 134/80  Pulse 94  Temp 97.4 F (36.3 C) (Oral)  Wt 218 lb (98.884 kg)  SpO2 97%   Review of Systems Denies LOC    Objective:   Physical Exam Pulses: dorsalis pedis intact bilat.   Feet: no deformity.  no ulcer on the feet.  feet are of normal color and temp.  no edema.  There is bilateral onychomycosis Neuro: sensation is intact to touch on the feet, but decreased from normal.      Assessment & Plan:  DM:  therapy limited by pt's need for a simple regimen.  based on the pattern of her cbg's, she needs a shorter-acting insulin.

## 2012-05-25 ENCOUNTER — Other Ambulatory Visit: Payer: Self-pay | Admitting: *Deleted

## 2012-05-25 ENCOUNTER — Telehealth: Payer: Self-pay | Admitting: Endocrinology

## 2012-05-25 MED ORDER — POTASSIUM CHLORIDE CRYS ER 20 MEQ PO TBCR: 20.0000 meq | EXTENDED_RELEASE_TABLET | Freq: Three times a day (TID) | ORAL | Status: DC

## 2012-05-25 MED ORDER — METOLAZONE 5 MG PO TABS: 5.0000 mg | ORAL_TABLET | Freq: Every day | ORAL | Status: DC | PRN

## 2012-05-25 MED ORDER — BUDESONIDE-FORMOTEROL FUMARATE 80-4.5 MCG/ACT IN AERO: 2.0000 | INHALATION_SPRAY | Freq: Two times a day (BID) | RESPIRATORY_TRACT | Status: DC

## 2012-05-25 NOTE — Telephone Encounter (Signed)
please call patient: Due to insurance, i changed advair to symbicort. i have sent a prescription to your pharmacy.

## 2012-05-26 NOTE — Telephone Encounter (Signed)
PATIENT NOTIFIED OF CHANGE IN MEDICATION OF INHALER DUE TO HER INSURANCE FROM ADVAIR TO SYMBICORT.

## 2012-05-28 ENCOUNTER — Encounter: Payer: Self-pay | Admitting: Physician Assistant

## 2012-05-28 ENCOUNTER — Ambulatory Visit (INDEPENDENT_AMBULATORY_CARE_PROVIDER_SITE_OTHER): Payer: Medicare Other | Admitting: Physician Assistant

## 2012-05-28 ENCOUNTER — Ambulatory Visit (INDEPENDENT_AMBULATORY_CARE_PROVIDER_SITE_OTHER): Payer: Medicare Other | Admitting: Cardiology

## 2012-05-28 VITALS — BP 140/80 | HR 82 | Ht 66.0 in | Wt 215.0 lb

## 2012-05-28 DIAGNOSIS — I6529 Occlusion and stenosis of unspecified carotid artery: Secondary | ICD-10-CM

## 2012-05-28 DIAGNOSIS — I5032 Chronic diastolic (congestive) heart failure: Secondary | ICD-10-CM

## 2012-05-28 DIAGNOSIS — R1031 Right lower quadrant pain: Secondary | ICD-10-CM | POA: Insufficient documentation

## 2012-05-28 DIAGNOSIS — R079 Chest pain, unspecified: Secondary | ICD-10-CM

## 2012-05-28 DIAGNOSIS — I2510 Atherosclerotic heart disease of native coronary artery without angina pectoris: Secondary | ICD-10-CM

## 2012-05-28 DIAGNOSIS — I10 Essential (primary) hypertension: Secondary | ICD-10-CM

## 2012-05-28 DIAGNOSIS — R0989 Other specified symptoms and signs involving the circulatory and respiratory systems: Secondary | ICD-10-CM

## 2012-05-28 NOTE — Patient Instructions (Addendum)
Your physician recommends that you schedule a follow-up appointment in: 3 months with Dr Daleen Squibb Your physician has requested that you have a carotid duplex. This test is an ultrasound of the carotid arteries in your neck. It looks at blood flow through these arteries that supply the brain with blood. Allow one hour for this exam. There are no restrictions or special instructions.

## 2012-05-28 NOTE — Assessment & Plan Note (Signed)
Blood pressure is up a little today but she had diarrhea on arrival to the office and has been feeling well from a GI standpoint today.

## 2012-05-28 NOTE — Assessment & Plan Note (Signed)
Patient is due for followup Dopplers this month.

## 2012-05-28 NOTE — Assessment & Plan Note (Signed)
Patient has history of nonobstructive coronary artery disease. She took 3 nitroglycerin last week for shortness of breath which helped. She did not have chest pain at that time. EKG remains unchanged. Continue medical therapy.

## 2012-05-28 NOTE — Assessment & Plan Note (Signed)
Patient had breathing difficulty last week while on CPAP requiring 3 nitroglycerin and extra Lasix when her weight was up by 4 pounds. She is able to manage this well at home and is feeling much better from a cardiac standpoint. She does weigh daily and the nurse calls her if her weight is up more than 3 pounds.

## 2012-05-28 NOTE — Progress Notes (Signed)
HPI:   This is an 78 year old female patient of Dr. Juanito Doom who is here today for 3 month followup. She is followed for history of coronary artery disease, congestive heart failure with grade 1 diastolic dysfunction and normal systolic function.  The patient isn't feeling well today as she's had diarrhea all morning. This is not unusual for her. She states that last week about 4 AM she was on her CPAP machine and felt short of breath. She ended up taking 3 nitroglycerin before she got relief. That morning her weight was up 4 pounds and she took extra Lasix. She was feeling much better after that and had no further chest pain or breathing difficulty, other than her chronic dyspnea on exertion. She is complaining of some right groin pain which she thinks she may have pulled a muscle straining to get up.   Allergies: -- Iohexol    --   Code: RASH, Desc: JENNIFER STATES ON PT'S CHART            ALLERGIC TO IV DYE 09/04/07/RM, Onset Date:            [id number]  Current Outpatient Prescriptions on File Prior to Visit: acetaminophen (TYLENOL) 500 MG tablet, Take 1,000 mg by mouth every 6 (six) hours as needed. pain, Disp: , Rfl:  albuterol (PROVENTIL HFA;VENTOLIN HFA) 108 (90 BASE) MCG/ACT inhaler, Inhale 2 puffs into the lungs every 4 (four) hours as needed., Disp: , Rfl:  aspirin 81 MG tablet, Take 81 mg by mouth daily. , Disp: , Rfl:  budesonide-formoterol (SYMBICORT) 80-4.5 MCG/ACT inhaler, Inhale 2 puffs into the lungs 2 (two) times daily., Disp: 1 Inhaler, Rfl: 12 ezetimibe (ZETIA) 10 MG tablet, Take 10 mg by mouth daily. , Disp: , Rfl:  Fluticasone-Salmeterol (ADVAIR DISKUS) 100-50 MCG/DOSE AEPB, , Disp: , Rfl:   furosemide (LASIX) 80 MG tablet, Take 160 mg by mouth 2 (two) times daily., Disp: , Rfl:  gabapentin (NEURONTIN) 300 MG capsule, Take 1 capsule (300 mg total) by mouth at bedtime., Disp: 30 capsule, Rfl: 11 glucose blood test strip, Use as instructed, Disp: 100 each, Rfl: 12 hydrALAZINE  (APRESOLINE) 25 MG tablet, TAKE ONE (1) TABLET(S) THREE (3) TIMES DAILY, Disp: 90 tablet, Rfl: 5 insulin NPH-insulin regular (NOVOLIN 70/30) (70-30) 100 UNIT/ML injection, Inject 120 Units into the skin daily with breakfast., Disp: 40 mL, Rfl: 12 isosorbide dinitrate (ISORDIL) 30 MG tablet, Take 30 mg by mouth 2 (two) times daily. , Disp: , Rfl:  labetalol (NORMODYNE) 200 MG tablet, Take 100 mg by mouth 2 (two) times daily. , Disp: , Rfl:  latanoprost (XALATAN) 0.005 % ophthalmic solution, Place 1 drop into both eyes at bedtime., Disp: , Rfl:  metolazone (ZAROXOLYN) 5 MG tablet, Take 1 tablet (5 mg total) by mouth daily as needed. Daily as needed for weight gain over 221 pounds, Disp: 30 tablet, Rfl: 3 mometasone (NASONEX) 50 MCG/ACT nasal spray, 2 sprays. 2 sprays each nostril each am, Disp: , Rfl:  nitroGLYCERIN (NITROSTAT) 0.4 MG SL tablet, Place 0.4 mg under the tongue every 5 (five) minutes as needed. For chest pain/MAX 3 doses, Disp: , Rfl:  potassium chloride SA (K-DUR,KLOR-CON) 20 MEQ tablet, Take 1 tablet (20 mEq total) by mouth 3 (three) times daily., Disp: 90 tablet, Rfl: 5 rosuvastatin (CRESTOR) 40 MG tablet, Take 40 mg by mouth every morning. , Disp: , Rfl:  traMADol (ULTRAM) 50 MG tablet, Take 1 tablet (50 mg total) by mouth every 4 (four) hours as needed for pain.,  Disp: 50 tablet, Rfl: 3 Vitamin D, Ergocalciferol, (DRISDOL) 50000 UNITS CAPS, Take 50,000 Units by mouth 2 (two) times a week. Monday and Thursday, Disp: , Rfl:     Past Medical History:   Uterine cancer                                               Diabetes mellitus                                              Comment:type II; peripheral neuropathy   Hypertension                                                 GERD (gastroesophageal reflux disease)                       Peripheral vascular disease                                  Obesity                                                      Dyslipidemia                                                  Diastolic CHF, chronic                                         Comment:EF 50-55%, mild LVH and grade 1 diast. Dysfxn   Osteoarthritis cervical spine                                DM retinopathy                                               Sleep apnea                                                    Comment:with CPAP   History of shingles                                          COPD (chronic obstructive pulmonary disease)  Depression                                                   Peptic ulcer disease                                           Comment:duodenal   Peptic stricture of esophagus                                Hiatal hernia                                                Diverticulosis                                               Arthritis                                                    ASCVD (arteriosclerotic cardiovascular disease)                Comment:on MRI brin   CAD (coronary artery disease)                                  Comment:a. s/p multiple caths with nonobs CAD;   b.               cath 1/10: pLAD 20%, mLAD 40%, pCFX 20%, mCFX               40%, pRCA 60-70%   Non-ischemic cardiomyopathy                                  Asthma                                                         Comment:Mild   Pruritic condition                                             Comment:Idiopathic   Hyperlipidemia                                               Zoster  Noncompliance                                               Past Surgical History:   TUBAL LIGATION                                  1967         KNEE ARTHROSCOPY                                10/1998        Comment:Left   CRANIOTOMY                                      1997           Comment:Left for SDH   CATARACT EXTRACTION, BILATERAL                  2005         HERNIA REPAIR                                                 ESOPHAGOGASTRODUODENOSCOPY                      04/04/2004   SPINE SURGERY                                                  Comment:C-spine and lumbar surgery   CHOLECYSTECTOMY                                 2010         CARDIAC CATHETERIZATION                                      DEXA                                            7/05         ABDOMINAL HYSTERECTOMY                                      Review of patient's family history indicates:   Cancer                         Mother                     Comment: "Stomach" Cancer   Diabetes  Mother                   Heart disease                  Mother                   Stomach cancer                 Mother                   Lymphoma                       Father                   Kidney disease                 Paternal Grandmother     Asthma                         Other                    Social History   Marital Status: Married             Spouse Name:                      Years of Education:                 Number of children: 7           Occupational History Occupation          Associate Professor            Comment              Retired                                   Social History Main Topics   Smoking Status: Never Smoker                     Smokeless Status: Not on file                      Alcohol Use: Yes               Comment: rare   Drug Use: No             Sexual Activity:                    Other Topics            Concern   None on file  Social History Narrative   None on file    ROS:see history of present illness otherwise negative   PHYSICAL EXAM: Obese, in no acute distress. Neck: No JVD, HJR, Bruit, or thyroid enlargement  Lungs: No tachypnea, clear without wheezing, rales, or rhonchi  Cardiovascular: RRR, PMI not displaced,positive S4, 1/6 systolic murmur at the left sternal border no bruit, thrill, or heave.  Abdomen: BS normal. Soft without organomegaly,  masses, lesions or tenderness.  Extremities: right groin was stable without hematoma or widened pulse. A little tender to palpation. Otherwise lower extremities without cyanosis, clubbing or edema. Good distal pulses bilateral  SKin: Warm, no lesions or rashes  Musculoskeletal: No deformities  Neuro: no focal signs  BP 140/80  Pulse 82  Ht 5\' 6"  (1.676 m)  Wt 215 lb (97.523 kg)  BMI 34.70 kg/m2 .   AOZ:HYQMVH sinus rhythm with nonspecific ST-T wave changes inferior lateral, unchanged from prior tracing

## 2012-05-30 ENCOUNTER — Encounter (HOSPITAL_COMMUNITY): Payer: Self-pay | Admitting: Internal Medicine

## 2012-05-30 ENCOUNTER — Emergency Department (HOSPITAL_COMMUNITY): Payer: Medicare Other

## 2012-05-30 ENCOUNTER — Observation Stay (HOSPITAL_COMMUNITY)
Admission: EM | Admit: 2012-05-30 | Discharge: 2012-06-02 | DRG: 392 | Disposition: A | Discharge status: Home / Self Care | Payer: Medicare Other | Attending: Internal Medicine | Admitting: Internal Medicine

## 2012-05-30 DIAGNOSIS — M1990 Unspecified osteoarthritis, unspecified site: Secondary | ICD-10-CM

## 2012-05-30 DIAGNOSIS — I509 Heart failure, unspecified: Secondary | ICD-10-CM

## 2012-05-30 DIAGNOSIS — I10 Essential (primary) hypertension: Secondary | ICD-10-CM

## 2012-05-30 DIAGNOSIS — R17 Unspecified jaundice: Secondary | ICD-10-CM

## 2012-05-30 DIAGNOSIS — R509 Fever, unspecified: Secondary | ICD-10-CM

## 2012-05-30 DIAGNOSIS — N186 End stage renal disease: Secondary | ICD-10-CM | POA: Diagnosis present

## 2012-05-30 DIAGNOSIS — Z794 Long term (current) use of insulin: Secondary | ICD-10-CM

## 2012-05-30 DIAGNOSIS — N183 Chronic kidney disease, stage 3 unspecified: Secondary | ICD-10-CM | POA: Diagnosis present

## 2012-05-30 DIAGNOSIS — R358 Other polyuria: Secondary | ICD-10-CM

## 2012-05-30 DIAGNOSIS — R05 Cough: Secondary | ICD-10-CM

## 2012-05-30 DIAGNOSIS — N259 Disorder resulting from impaired renal tubular function, unspecified: Secondary | ICD-10-CM

## 2012-05-30 DIAGNOSIS — E669 Obesity, unspecified: Secondary | ICD-10-CM | POA: Diagnosis present

## 2012-05-30 DIAGNOSIS — I739 Peripheral vascular disease, unspecified: Secondary | ICD-10-CM

## 2012-05-30 DIAGNOSIS — I5042 Chronic combined systolic (congestive) and diastolic (congestive) heart failure: Secondary | ICD-10-CM

## 2012-05-30 DIAGNOSIS — K279 Peptic ulcer, site unspecified, unspecified as acute or chronic, without hemorrhage or perforation: Secondary | ICD-10-CM

## 2012-05-30 DIAGNOSIS — R059 Cough, unspecified: Secondary | ICD-10-CM

## 2012-05-30 DIAGNOSIS — R079 Chest pain, unspecified: Secondary | ICD-10-CM

## 2012-05-30 DIAGNOSIS — R634 Abnormal weight loss: Secondary | ICD-10-CM

## 2012-05-30 DIAGNOSIS — R0602 Shortness of breath: Secondary | ICD-10-CM

## 2012-05-30 DIAGNOSIS — E1165 Type 2 diabetes mellitus with hyperglycemia: Secondary | ICD-10-CM | POA: Diagnosis present

## 2012-05-30 DIAGNOSIS — J45909 Unspecified asthma, uncomplicated: Secondary | ICD-10-CM

## 2012-05-30 DIAGNOSIS — M810 Age-related osteoporosis without current pathological fracture: Secondary | ICD-10-CM

## 2012-05-30 DIAGNOSIS — I2510 Atherosclerotic heart disease of native coronary artery without angina pectoris: Secondary | ICD-10-CM

## 2012-05-30 DIAGNOSIS — G4733 Obstructive sleep apnea (adult) (pediatric): Secondary | ICD-10-CM

## 2012-05-30 DIAGNOSIS — E876 Hypokalemia: Secondary | ICD-10-CM

## 2012-05-30 DIAGNOSIS — J449 Chronic obstructive pulmonary disease, unspecified: Secondary | ICD-10-CM | POA: Diagnosis present

## 2012-05-30 DIAGNOSIS — E559 Vitamin D deficiency, unspecified: Secondary | ICD-10-CM

## 2012-05-30 DIAGNOSIS — I428 Other cardiomyopathies: Secondary | ICD-10-CM | POA: Diagnosis present

## 2012-05-30 DIAGNOSIS — F329 Major depressive disorder, single episode, unspecified: Secondary | ICD-10-CM

## 2012-05-30 DIAGNOSIS — R809 Proteinuria, unspecified: Secondary | ICD-10-CM

## 2012-05-30 DIAGNOSIS — Z6834 Body mass index (BMI) 34.0-34.9, adult: Secondary | ICD-10-CM

## 2012-05-30 DIAGNOSIS — I129 Hypertensive chronic kidney disease with stage 1 through stage 4 chronic kidney disease, or unspecified chronic kidney disease: Secondary | ICD-10-CM | POA: Diagnosis present

## 2012-05-30 DIAGNOSIS — N179 Acute kidney failure, unspecified: Secondary | ICD-10-CM

## 2012-05-30 DIAGNOSIS — R609 Edema, unspecified: Secondary | ICD-10-CM

## 2012-05-30 DIAGNOSIS — E785 Hyperlipidemia, unspecified: Secondary | ICD-10-CM

## 2012-05-30 DIAGNOSIS — R9431 Abnormal electrocardiogram [ECG] [EKG]: Secondary | ICD-10-CM | POA: Diagnosis present

## 2012-05-30 DIAGNOSIS — Z7982 Long term (current) use of aspirin: Secondary | ICD-10-CM

## 2012-05-30 DIAGNOSIS — K219 Gastro-esophageal reflux disease without esophagitis: Secondary | ICD-10-CM

## 2012-05-30 DIAGNOSIS — R252 Cramp and spasm: Secondary | ICD-10-CM

## 2012-05-30 DIAGNOSIS — R1011 Right upper quadrant pain: Secondary | ICD-10-CM

## 2012-05-30 DIAGNOSIS — G609 Hereditary and idiopathic neuropathy, unspecified: Secondary | ICD-10-CM

## 2012-05-30 DIAGNOSIS — R109 Unspecified abdominal pain: Secondary | ICD-10-CM

## 2012-05-30 DIAGNOSIS — Z91199 Patient's noncompliance with other medical treatment and regimen due to unspecified reason: Secondary | ICD-10-CM

## 2012-05-30 DIAGNOSIS — J4489 Other specified chronic obstructive pulmonary disease: Secondary | ICD-10-CM

## 2012-05-30 DIAGNOSIS — M79609 Pain in unspecified limb: Secondary | ICD-10-CM

## 2012-05-30 DIAGNOSIS — R1031 Right lower quadrant pain: Secondary | ICD-10-CM

## 2012-05-30 DIAGNOSIS — Z79899 Other long term (current) drug therapy: Secondary | ICD-10-CM

## 2012-05-30 DIAGNOSIS — R3589 Other polyuria: Secondary | ICD-10-CM

## 2012-05-30 DIAGNOSIS — E162 Hypoglycemia, unspecified: Secondary | ICD-10-CM

## 2012-05-30 DIAGNOSIS — J45901 Unspecified asthma with (acute) exacerbation: Secondary | ICD-10-CM

## 2012-05-30 DIAGNOSIS — R0989 Other specified symptoms and signs involving the circulatory and respiratory systems: Secondary | ICD-10-CM

## 2012-05-30 DIAGNOSIS — A088 Other specified intestinal infections: Principal | ICD-10-CM | POA: Diagnosis present

## 2012-05-30 DIAGNOSIS — F3289 Other specified depressive episodes: Secondary | ICD-10-CM

## 2012-05-30 DIAGNOSIS — N058 Unspecified nephritic syndrome with other morphologic changes: Secondary | ICD-10-CM | POA: Diagnosis present

## 2012-05-30 DIAGNOSIS — K269 Duodenal ulcer, unspecified as acute or chronic, without hemorrhage or perforation: Secondary | ICD-10-CM | POA: Diagnosis present

## 2012-05-30 DIAGNOSIS — R197 Diarrhea, unspecified: Secondary | ICD-10-CM

## 2012-05-30 DIAGNOSIS — R1319 Other dysphagia: Secondary | ICD-10-CM

## 2012-05-30 DIAGNOSIS — E1129 Type 2 diabetes mellitus with other diabetic kidney complication: Secondary | ICD-10-CM | POA: Diagnosis present

## 2012-05-30 DIAGNOSIS — IMO0002 Reserved for concepts with insufficient information to code with codable children: Secondary | ICD-10-CM

## 2012-05-30 DIAGNOSIS — Z9119 Patient's noncompliance with other medical treatment and regimen: Secondary | ICD-10-CM

## 2012-05-30 LAB — COMPREHENSIVE METABOLIC PANEL
AST: 25 U/L (ref 0–37)
BUN: 23 mg/dL (ref 6–23)
Chloride: 97 mEq/L (ref 96–112)
GFR calc Af Amer: 35 mL/min — ABNORMAL LOW (ref >90)
GFR calc non Af Amer: 30 mL/min — ABNORMAL LOW (ref >90)
Glucose, Bld: 66 mg/dL — ABNORMAL LOW (ref 70–99)
Potassium: 3.3 mEq/L — ABNORMAL LOW (ref 3.5–5.1)
Sodium: 139 mEq/L (ref 135–145)
Total Protein: 6.9 g/dL (ref 6.0–8.3)

## 2012-05-30 LAB — CBC WITH DIFFERENTIAL/PLATELET
Basophils Relative: 0 % (ref 0–1)
Hemoglobin: 15.6 g/dL — ABNORMAL HIGH (ref 12.0–15.0)
Lymphs Abs: 1 10*3/uL (ref 0.7–4.0)
Monocytes Relative: 5 % (ref 3–12)
Neutro Abs: 8 10*3/uL — ABNORMAL HIGH (ref 1.7–7.7)
Neutrophils Relative %: 84 % — ABNORMAL HIGH (ref 43–77)
RBC: 5.33 MIL/uL — ABNORMAL HIGH (ref 3.87–5.11)
WBC: 9.5 10*3/uL (ref 4.0–10.5)

## 2012-05-30 LAB — PROTIME-INR
INR: 1.03 (ref 0.00–1.49)
Prothrombin Time: 13.4 seconds (ref 11.6–15.2)

## 2012-05-30 LAB — TROPONIN I: Troponin I: <0.3 ng/mL (ref <0.30)

## 2012-05-30 LAB — URINE MICROSCOPIC-ADD ON

## 2012-05-30 LAB — LACTIC ACID, PLASMA: Lactic Acid, Venous: 1.7 mmol/L (ref 0.5–2.2)

## 2012-05-30 LAB — URINALYSIS, ROUTINE W REFLEX MICROSCOPIC
Hgb urine dipstick: NEGATIVE
Protein, ur: 100 mg/dL — AB
Urobilinogen, UA: 1 mg/dL (ref 0.0–1.0)

## 2012-05-30 LAB — GLUCOSE, CAPILLARY: Glucose-Capillary: 136 mg/dL — ABNORMAL HIGH (ref 70–99)

## 2012-05-30 LAB — BILIRUBIN, DIRECT: Bilirubin, Direct: 0.2 mg/dL (ref 0.0–0.3)

## 2012-05-30 LAB — CK TOTAL AND CKMB (NOT AT ARMC): Relative Index: 1.5 (ref 0.0–2.5)

## 2012-05-30 LAB — MAGNESIUM: Magnesium: 1.6 mg/dL (ref 1.5–2.5)

## 2012-05-30 MED ORDER — HEPARIN SODIUM (PORCINE) 5000 UNIT/ML IJ SOLN
5000.0000 [IU] | Freq: Three times a day (TID) | INTRAMUSCULAR | Status: DC
  Administered 2012-05-30 – 2012-06-02 (×9): 5000 [IU] via SUBCUTANEOUS
  Filled 2012-05-30 (×11): qty 1

## 2012-05-30 MED ORDER — ONDANSETRON HCL 4 MG/2ML IJ SOLN: 4.0000 mg | Freq: Four times a day (QID) | INTRAMUSCULAR | Status: DC | PRN

## 2012-05-30 MED ORDER — ATORVASTATIN CALCIUM 80 MG PO TABS
80.0000 mg | ORAL_TABLET | Freq: Every day | ORAL | Status: DC
  Administered 2012-05-31 – 2012-06-02 (×3): 80 mg via ORAL
  Filled 2012-05-30 (×3): qty 1

## 2012-05-30 MED ORDER — ACETAMINOPHEN 325 MG PO TABS: 650.0000 mg | ORAL_TABLET | Freq: Four times a day (QID) | ORAL | Status: DC | PRN

## 2012-05-30 MED ORDER — ASPIRIN EC 325 MG PO TBEC
325.0000 mg | DELAYED_RELEASE_TABLET | Freq: Every day | ORAL | Status: DC
  Administered 2012-05-31 – 2012-06-02 (×3): 325 mg via ORAL
  Filled 2012-05-30 (×4): qty 1

## 2012-05-30 MED ORDER — SODIUM CHLORIDE 0.9 % IV BOLUS (SEPSIS)
1000.0000 mL | Freq: Once | INTRAVENOUS | Status: DC
Start: 2012-05-30 — End: 2012-05-30

## 2012-05-30 MED ORDER — MORPHINE SULFATE 2 MG/ML IJ SOLN
INTRAMUSCULAR | Status: AC
  Administered 2012-05-30: 1 mg via INTRAVENOUS
  Filled 2012-05-30: qty 1

## 2012-05-30 MED ORDER — ISOSORBIDE DINITRATE 30 MG PO TABS
30.0000 mg | ORAL_TABLET | Freq: Two times a day (BID) | ORAL | Status: DC
  Administered 2012-05-30 – 2012-06-02 (×6): 30 mg via ORAL
  Filled 2012-05-30 (×7): qty 1

## 2012-05-30 MED ORDER — INSULIN ASPART 100 UNIT/ML ~~LOC~~ SOLN
3.0000 [IU] | Freq: Three times a day (TID) | SUBCUTANEOUS | Status: DC
  Administered 2012-05-31 – 2012-06-01 (×5): 3 [IU] via SUBCUTANEOUS

## 2012-05-30 MED ORDER — ONDANSETRON HCL 4 MG PO TABS: 4.0000 mg | ORAL_TABLET | Freq: Four times a day (QID) | ORAL | Status: DC | PRN

## 2012-05-30 MED ORDER — SODIUM CHLORIDE 0.9 % IV BOLUS (SEPSIS)
500.0000 mL | Freq: Once | INTRAVENOUS | Status: AC
  Administered 2012-05-30: 500 mL via INTRAVENOUS

## 2012-05-30 MED ORDER — ACETAMINOPHEN 500 MG PO TABS
1000.0000 mg | ORAL_TABLET | Freq: Four times a day (QID) | ORAL | Status: DC | PRN
  Administered 2012-06-01: 1000 mg via ORAL
  Filled 2012-05-30: qty 2

## 2012-05-30 MED ORDER — SODIUM CHLORIDE 0.9 % IJ SOLN
3.0000 mL | Freq: Two times a day (BID) | INTRAMUSCULAR | Status: DC
  Administered 2012-05-31 – 2012-06-01 (×3): 3 mL via INTRAVENOUS

## 2012-05-30 MED ORDER — POTASSIUM CHLORIDE CRYS ER 20 MEQ PO TBCR
40.0000 meq | EXTENDED_RELEASE_TABLET | Freq: Three times a day (TID) | ORAL | Status: AC
  Administered 2012-05-31 (×3): 40 meq via ORAL
  Filled 2012-05-30 (×3): qty 2

## 2012-05-30 MED ORDER — SODIUM CHLORIDE 0.9 % IV SOLN
INTRAVENOUS | Status: AC
  Administered 2012-05-31: via INTRAVENOUS

## 2012-05-30 MED ORDER — VITAMIN D (ERGOCALCIFEROL) 1.25 MG (50000 UNIT) PO CAPS
50000.0000 [IU] | ORAL_CAPSULE | ORAL | Status: DC
  Filled 2012-05-30: qty 1

## 2012-05-30 MED ORDER — MORPHINE SULFATE 2 MG/ML IJ SOLN
1.0000 mg | INTRAMUSCULAR | Status: DC | PRN
Start: 2012-05-30 — End: 2012-06-02
  Administered 2012-05-30 – 2012-05-31 (×2): 1 mg via INTRAVENOUS
  Filled 2012-05-30: qty 1

## 2012-05-30 MED ORDER — ACETAMINOPHEN 650 MG RE SUPP: 650.0000 mg | Freq: Four times a day (QID) | RECTAL | Status: DC | PRN

## 2012-05-30 MED ORDER — HYDROMORPHONE HCL PF 1 MG/ML IJ SOLN: 1.0000 mg | INTRAMUSCULAR | Status: DC | PRN

## 2012-05-30 MED ORDER — LATANOPROST 0.005 % OP SOLN
1.0000 [drp] | Freq: Every day | OPHTHALMIC | Status: DC
  Administered 2012-05-30 – 2012-06-01 (×3): 1 [drp] via OPHTHALMIC
  Filled 2012-05-30: qty 2.5

## 2012-05-30 MED ORDER — INSULIN ASPART 100 UNIT/ML ~~LOC~~ SOLN
0.0000 [IU] | Freq: Every day | SUBCUTANEOUS | Status: DC
  Administered 2012-05-31: 2 [IU] via SUBCUTANEOUS

## 2012-05-30 MED ORDER — ONDANSETRON HCL 4 MG/2ML IJ SOLN: 4.0000 mg | Freq: Three times a day (TID) | INTRAMUSCULAR | Status: AC | PRN

## 2012-05-30 MED ORDER — LABETALOL HCL 100 MG PO TABS
100.0000 mg | ORAL_TABLET | Freq: Two times a day (BID) | ORAL | Status: DC
  Administered 2012-05-30 – 2012-06-01 (×5): 100 mg via ORAL
  Filled 2012-05-30 (×8): qty 1

## 2012-05-30 MED ORDER — POTASSIUM CHLORIDE CRYS ER 20 MEQ PO TBCR
20.0000 meq | EXTENDED_RELEASE_TABLET | Freq: Once | ORAL | Status: AC
  Administered 2012-05-30: 20 meq via ORAL
  Filled 2012-05-30: qty 1

## 2012-05-30 MED ORDER — INSULIN ASPART 100 UNIT/ML ~~LOC~~ SOLN
0.0000 [IU] | Freq: Three times a day (TID) | SUBCUTANEOUS | Status: DC
  Administered 2012-05-31: 2 [IU] via SUBCUTANEOUS
  Administered 2012-05-31: 1 [IU] via SUBCUTANEOUS
  Administered 2012-05-31 – 2012-06-01 (×2): 5 [IU] via SUBCUTANEOUS
  Administered 2012-06-01 – 2012-06-02 (×3): 3 [IU] via SUBCUTANEOUS

## 2012-05-30 MED ORDER — NITROGLYCERIN 0.4 MG SL SUBL
0.4000 mg | SUBLINGUAL_TABLET | SUBLINGUAL | Status: DC | PRN
  Administered 2012-05-30 (×2): 0.4 mg via SUBLINGUAL
  Filled 2012-05-30 (×2): qty 25

## 2012-05-30 MED ORDER — HYDRALAZINE HCL 25 MG PO TABS
25.0000 mg | ORAL_TABLET | Freq: Three times a day (TID) | ORAL | Status: DC
  Administered 2012-05-30 – 2012-06-02 (×7): 25 mg via ORAL
  Filled 2012-05-30 (×10): qty 1

## 2012-05-30 MED ORDER — DEXTROSE 50 % IV SOLN
1.0000 | Freq: Once | INTRAVENOUS | Status: AC
  Administered 2012-05-30: 50 mL via INTRAVENOUS
  Filled 2012-05-30: qty 50

## 2012-05-30 NOTE — ED Notes (Signed)
Pt started having diarrhea and lower abdominal pain approximately 11:45am this am. Called EMS. EMS reports pt's CBG was 59 upon arrival. EMS started piv 20g in left wrist and administered 1 amp of D50. Recheck CBG was 166. Pt was somewhat confused, lethargic, pale and diaphoretic upon EMS arrival. Pt alert and oriented x 4, better skin color post D50 amp administration.

## 2012-05-30 NOTE — ED Notes (Signed)
[id number]> Expected date:<BR> Expected time:<BR> Means of arrival:<BR> Comments:<BR> 78 y/o F abd pain, NVD, CBG 50

## 2012-05-30 NOTE — ED Notes (Signed)
Attempted to ambulate pt. Patient took a few steps and stated she was dizzy so I checked her CBG and it was 49. Gave patient [employer] and reported to PA [employer].

## 2012-05-30 NOTE — ED Notes (Signed)
Went to collect labs - NT doing a few things.  Was told to come back in 20 min.

## 2012-05-30 NOTE — ED Provider Notes (Signed)
Consulted [facility] as requested by [facility].  They are aware of EKG changes and recent exertional CP/SOB.  However, Dr. Daleen Squibb saw patient 12/12 and feel that consultation is unnecessary at this time.  They will be happy to see her if the hospitalist has any other concerns.  Hospitalist has been made aware.    Otilio Miu, PA-C 05/30/12 [id number]

## 2012-05-30 NOTE — Progress Notes (Signed)
Spoke with hopsitalist, and morphine ordered. Will observe closely.

## 2012-05-30 NOTE — Progress Notes (Signed)
Call back to ED for report, nurse busy.

## 2012-05-30 NOTE — H&P (Addendum)
[facility] History and Physical  [name] OZH:[id number] DOB: 1930-02-10 DOA: 05/30/2012  Referring physician: None PCP: Romero Belling, MD  Specialists: None  Chief Complaint: Diarrhea and chest pain  HPI: [name] is a 78 y.o. female  Past medical history of chronic diastolic heart failure with a preserved EF, diabetes mellitus last hemoglobin A1c of 10.9, nonobstructive coronary artery disease status post cath in January 2010 comes in for diarrhea 2 days prior to admission Abdominal pain and chest pain. - She relates her abdominal pain started this morning progressively getting worse. She's had diarrhea for 2 days nonbloody. Which has not improved. Here in the ED she's had to most. With some mild nausea and vomiting. She relates no recent antibiotic use. No sick contacts.  - She relates she started having chest pain prior to admission at rest took a nitroglycerin and improved. On the prior to admission she started having chest pain with exertion and improve after 3 nitroglycerin. She relates is the first time this has happened. Along with this she also had some shortness of breath which also improved with a nitroglycerin. She also relates some in nonproductive cough with mild shortness of breath which has been going on for the past several days.  Review of Systems: The patient denies anorexia, fever, weight loss,, vision loss, decreased hearing, hoarseness, syncope, , peripheral edema, balance deficits, hemoptysis, melena, hematochezia, severe indigestion/heartburn, hematuria, incontinence, genital sores, muscle weakness, suspicious skin lesions, transient blindness, difficulty walking, depression, unusual weight change, abnormal bleeding, enlarged lymph nodes, angioedema, and breast masses.    Past Medical History  Diagnosis Date  . Uterine cancer   . Diabetes mellitus     type II; peripheral neuropathy  . Hypertension   . GERD (gastroesophageal reflux  disease)   . Peripheral vascular disease   . Obesity   . Dyslipidemia   . Diastolic CHF, chronic     EF 50-55%, mild LVH and grade 1 diast. Dysfxn  . Osteoarthritis cervical spine   . DM retinopathy   . Sleep apnea     with CPAP  . History of shingles   . COPD (chronic obstructive pulmonary disease)   . Depression   . Peptic ulcer disease     duodenal  . Peptic stricture of esophagus   . Hiatal hernia   . Diverticulosis   . Arthritis   . ASCVD (arteriosclerotic cardiovascular disease)     on MRI brin  . CAD (coronary artery disease)     a. s/p multiple caths with nonobs CAD;   b. cath 1/10: pLAD 20%, mLAD 40%, pCFX 20%, mCFX 40%, pRCA 60-70%  . Non-ischemic cardiomyopathy   . Asthma     Mild  . Pruritic condition     Idiopathic  . Hyperlipidemia   . Zoster   . Noncompliance    Past Surgical History  Procedure Date  . Tubal ligation 1967  . Knee arthroscopy 10/1998    Left  . Craniotomy 1997    Left for SDH  . Cataract extraction, bilateral 2005  . Hernia repair   . Esophagogastroduodenoscopy 04/04/2004  . Spine surgery     C-spine and lumbar surgery  . Cholecystectomy 2010  . Cardiac catheterization   . Dexa 7/05  . Abdominal hysterectomy    Social History:  reports that she has never smoked. She does not have any smokeless tobacco history on file. She reports that she does not drink alcohol or use illicit drugs. His at home with  husband can perform all her ADLs  Allergies  Allergen Reactions  . Iohexol      Code: RASH, Desc: JENNIFER STATES ON PT'S CHART ALLERGIC TO IV DYE 09/04/07/RM, Onset Date: [id number]     Family History  Problem Relation Age of Onset  . Cancer Mother     "Stomach" Cancer  . Diabetes Mother   . Heart disease Mother   . Stomach cancer Mother   . Lymphoma Father   . Kidney disease Paternal Grandmother   . Asthma Other    Prior to Admission medications   Medication Sig Start Date End Date Taking? Authorizing Provider   acetaminophen (TYLENOL) 500 MG tablet Take 1,000 mg by mouth every 6 (six) hours as needed. For pain   Yes Historical Provider, MD  aspirin EC 325 MG tablet Take 325 mg by mouth daily.   Yes Historical Provider, MD  ezetimibe (ZETIA) 10 MG tablet Take 10 mg by mouth daily.    Yes Historical Provider, MD  furosemide (LASIX) 80 MG tablet Take 160 mg by mouth 2 (two) times daily. 04/19/11  Yes Gaylord Shih, MD  hydrALAZINE (APRESOLINE) 25 MG tablet Take 25 mg by mouth 3 (three) times daily.   Yes Historical Provider, MD  insulin NPH-insulin regular (NOVOLIN 70/30) (70-30) 100 UNIT/ML injection Inject 120 Units into the skin daily with breakfast. 05/21/12  Yes Romero Belling, MD  isosorbide dinitrate (ISORDIL) 30 MG tablet Take 30 mg by mouth 2 (two) times daily.  04/12/11  Yes Gaylord Shih, MD  labetalol (NORMODYNE) 200 MG tablet Take 100 mg by mouth 2 (two) times daily.    Yes Romero Belling, MD  latanoprost (XALATAN) 0.005 % ophthalmic solution Place 1 drop into both eyes at bedtime.   Yes Historical Provider, MD  methylcellulose (ARTIFICIAL TEARS) 1 % ophthalmic solution Place 1 drop into both eyes daily. For dry eyes   Yes Historical Provider, MD  metolazone (ZAROXOLYN) 5 MG tablet Take 1 tablet (5 mg total) by mouth daily as needed. Daily as needed for weight gain over 221 pounds 05/25/12  Yes Gaylord Shih, MD  nitroGLYCERIN (NITROSTAT) 0.4 MG SL tablet Place 0.4 mg under the tongue every 5 (five) minutes as needed. For chest pain/MAX 3 doses   Yes Historical Provider, MD  potassium chloride SA (K-DUR,KLOR-CON) 20 MEQ tablet Take 1 tablet (20 mEq total) by mouth 3 (three) times daily. 05/25/12  Yes Gaylord Shih, MD  rosuvastatin (CRESTOR) 40 MG tablet Take 40 mg by mouth every morning.    Yes Historical Provider, MD  Vitamin D, Ergocalciferol, (DRISDOL) 50000 UNITS CAPS Take 50,000 Units by mouth 2 (two) times a week. Monday and Thursday   Yes Historical Provider, MD   Physical Exam: Filed Vitals:    05/30/12 1231 05/30/12 1232 05/30/12 1909  BP:  127/48 139/60  Pulse:  57 65  Temp:  97.8 F (36.6 C) 98 F (36.7 C)  TempSrc:  Oral Oral  Resp:  22 13  SpO2: 98% 93% 96%     General:  Awake alert and oriented x3 obese female  Eyes: Anicteric no pallor  ENT: Dry mucous membranes  Neck: No JVD  Cardiovascular: Regular rate and rhythm with positive S1 and S2 no murmurs rubs gallops  Respiratory: Good air movement clear to auscultation  Abdomen: Positive bowel sounds nontender nondistended and soft  Skin: No rashes ulcerations  Musculoskeletal: Intact  Psychiatric: Appropriate  Neurologic: Nonfocal  Labs on Admission:  Basic Metabolic Panel:  Lab 05/30/12 1432  NA 139  K 3.3*  CL 97  CO2 31  GLUCOSE 66*  BUN 23  CREATININE 1.56*  CALCIUM 9.0  MG --  PHOS --   Liver Function Tests:  Lab 05/30/12 1432  AST 25  ALT 19  ALKPHOS 93  BILITOT 1.4*  PROT 6.9  ALBUMIN 3.2*    Lab 05/30/12 1432  LIPASE 18  AMYLASE --   No results found for this basename: AMMONIA:5 in the last 168 hours CBC:  Lab 05/30/12 1432  WBC 9.5  NEUTROABS 8.0*  HGB 15.6*  HCT 47.3*  MCV 88.7  PLT 274   Cardiac Enzymes:  Lab 05/30/12 1432  CKTOTAL --  CKMB --  CKMBINDEX --  TROPONINI <0.30    BNP (last 3 results)  Basename 05/30/12 1432  PROBNP 3213.0*   CBG:  Lab 05/30/12 1935 05/30/12 1246  GLUCAP 49* 136*    Radiological Exams on Admission: Ct Abdomen Pelvis Wo Contrast  05/30/2012  *RADIOLOGY REPORT*  Clinical Data: Abdominal pain, IV contrast allergy  CT ABDOMEN AND PELVIS WITHOUT CONTRAST  Technique:  Multidetector CT imaging of the abdomen and pelvis was performed following the standard protocol without intravenous contrast.  Comparison: CT abdomen 09/19/2008  Findings: Lung bases are clear.  No pericardial fluid.  Valvular calcification noted.  No focal hepatic lesion on noncontrast exam.  Gallbladder clips within the gallbladder fossa.  Pancreas,  spleen, adrenal glands, kidneys are normal.  The stomach is normal.  Hiatal hernia is present.  The small bowel and colon are normal.  The cecum is midline.  Appendix is not identified.  Contrast flows in entirety of the bowel to the rectum.  Abdominal aorta normal caliber.  No retroperitoneal periportal lymphadenopathy.  Uterus and ovaries are normal.  The bladder is normal.  No pelvic lymphadenopathy. Review of  bone windows demonstrates no aggressive osseous lesions.  IMPRESSION:  1.  No acute abdominal or pelvic findings.  No change from prior. 2.  Cholecystectomy   Original Report Authenticated By: Genevive Bi, M.D.    Dg Chest 2 View  05/30/2012  *RADIOLOGY REPORT*  Clinical Data: Cough.COPD.  Congestive heart failure.  Coronary artery disease.  CHEST - 2 VIEW  Comparison: 06/27/2011  Findings: Mild cardiomegaly stable.  Both lungs are clear.  No evidence of pleural effusion.  No mass or lymphadenopathy identified.  IMPRESSION: Stable cardiomegaly.  No active lung disease.   Original Report Authenticated By: Myles Rosenthal, M.D.     EKG: Normal sinus rhythm with T wave inversions in 1 and aVL V4 through V5 and nonspecific intraventricular delay  Assessment/Plan Chest pain at rest: - Quite concerning that she has new flipped T waves 1, aVL V4 through V6. Complaining of chest pain and shortness of breath having to use 3 nitroglycerin to help with the pain., She has poorly controlled diabetes. At this time we'll give her an aspirin. We'll get a 2-D echo to check for any wall motion abnormalities., I have already asked the emergency room department M.D. 2 consult cardiology. I will not start heparin at this time, her first set of cardiac enzymes is negative. We'll continue to cycle her cardiac enzymes. - She has no JVD on physical exam she has an elevated BNP, her lungs are clear to auscultation. - Continue her beta blocker and aspirin. Also continued Imdur.  Type 2 diabetes mellitus, controlled,  with renal complications -I will DC her 7030 DC her oral medications. Maryclare Labrador start on  sliding scale insulin. We'll give her a card modified diet.  She's had episodes of hypoglycemia here in the hospital. His most likely secondary to her 7030 and her acute kidney injury which is making her insulin last longer.  HYPOKALEMIA - Most likely secondary to her diarrhea. Replete check a magnesium level. Hold her diuretics.  HYPERTENSION: -We'll continue her labetalol, hold her diuretics as she is in acute renal failure, she doesn't have any JVD or crackles on physical exam. Also hold his hydralazine and continue her Imdur.   RENAL INSUFFICIENCY - Hold her diuretics as she is in acute renal failure. Go ahead and check a urinary sodium and urinary creatinine. We'll have them give her IV fluids very gently as she does have a history of diastolic heart failure. Check a basic metabolic panel in the morning.   Abdominal pain/ Diarrhea: - Unclear etiology. She has mild elevated bilirubin. She has no history of being hospitalized in the last 6 weeks. She hasn't been on any course of antibiotics. She hasn't been on chemotherapy lately. Question if this is viral gastroenteritis. We'll go ahead and check a C. difficile. We'll not start Flagyl at this time empirically. - Fractionate her hepatic function test although her other LFTs are within normal limits. Unlikely acute hepatitis.  Consulted cardiology  Formally.  Code Status: full Family Communication: daughter husband in room during interview Disposition Plan: home 2-3 days (indicate anticipated LOS)  Time spent: 60 minutes  Marinda Elk [facility] Pager [phone]  If 7PM-7AM, please contact night-coverage [email or web address] Password [facility] 05/30/2012, 8:23 PM

## 2012-05-30 NOTE — ED Provider Notes (Signed)
History     CSN: [id number]  Arrival date & time 05/30/12  1232   First MD Initiated Contact with Patient 05/30/12 1316      Chief Complaint  Patient presents with  . Abdominal Pain    (Consider location/radiation/quality/duration/timing/severity/associated sxs/prior treatment) HPI  78 year old female with history of diabetes, urine cancer, and diverticulosis presents complaining of abdominal pain. Patient reports acute onset of low abnormal pain started at 10 AM this morning. Describe pain as a sharp and throbbing sensation with associated diarrhea. Diarrhea is nonbloody, non-mucousy. Pain initially a 6/10 as a 4/10 without any specific treatment. She endorses decreased appetite, mild nausea without vomiting. No recent antibiotic use. Pt has had 3-4 bouts of diarrhea daily x 3 days. Patient also endorsed a nonproductive cough with mild shortness of breath which has been ongoing for the past several days.  She denies fever, chills, headache, chest pain, back pain, dysuria, or rash. Per EMS report, patient appears drowsy, confused, lethargic, pale and diaphoretic upon EMS arrival. Initial blood sugar was 59. She was given 1 amp of D50 and rechecked CBG was 166.  Pt has hx of diabetes, and did not eat her breakfast this AM.    Past Medical History  Diagnosis Date  . Uterine cancer   . Diabetes mellitus     type II; peripheral neuropathy  . Hypertension   . GERD (gastroesophageal reflux disease)   . Peripheral vascular disease   . Obesity   . Dyslipidemia   . Diastolic CHF, chronic     EF 50-55%, mild LVH and grade 1 diast. Dysfxn  . Osteoarthritis cervical spine   . DM retinopathy   . Sleep apnea     with CPAP  . History of shingles   . COPD (chronic obstructive pulmonary disease)   . Depression   . Peptic ulcer disease     duodenal  . Peptic stricture of esophagus   . Hiatal hernia   . Diverticulosis   . Arthritis   . ASCVD (arteriosclerotic cardiovascular disease)     on  MRI brin  . CAD (coronary artery disease)     a. s/p multiple caths with nonobs CAD;   b. cath 1/10: pLAD 20%, mLAD 40%, pCFX 20%, mCFX 40%, pRCA 60-70%  . Non-ischemic cardiomyopathy   . Asthma     Mild  . Pruritic condition     Idiopathic  . Hyperlipidemia   . Zoster   . Noncompliance     Past Surgical History  Procedure Date  . Tubal ligation 1967  . Knee arthroscopy 10/1998    Left  . Craniotomy 1997    Left for SDH  . Cataract extraction, bilateral 2005  . Hernia repair   . Esophagogastroduodenoscopy 04/04/2004  . Spine surgery     C-spine and lumbar surgery  . Cholecystectomy 2010  . Cardiac catheterization   . Dexa 7/05  . Abdominal hysterectomy     Family History  Problem Relation Age of Onset  . Cancer Mother     "Stomach" Cancer  . Diabetes Mother   . Heart disease Mother   . Stomach cancer Mother   . Lymphoma Father   . Kidney disease Paternal Grandmother   . Asthma Other     History  Substance Use Topics  . Smoking status: Never Smoker   . Smokeless tobacco: Not on file  . Alcohol Use: Yes     Comment: rare    OB History    Grav Para Term  Preterm Abortions TAB SAB Ect Mult Living                  Review of Systems  Constitutional: Negative for fever.  Respiratory: Positive for cough and shortness of breath.   Cardiovascular: Negative for chest pain.  Gastrointestinal: Positive for nausea, abdominal pain and diarrhea. Negative for vomiting, blood in stool and rectal pain.  Genitourinary: Negative for dysuria.  Skin: Negative for rash.  All other systems reviewed and are negative.    Allergies  Iohexol  Home Medications   Current Outpatient Rx  Name  Route  Sig  Dispense  Refill  . ACETAMINOPHEN 500 MG PO TABS   Oral   Take 1,000 mg by mouth every 6 (six) hours as needed. pain         . ALBUTEROL SULFATE HFA 108 (90 BASE) MCG/ACT IN AERS   Inhalation   Inhale 2 puffs into the lungs every 4 (four) hours as needed.          . ASPIRIN 81 MG PO TABS   Oral   Take 81 mg by mouth daily.          . BUDESONIDE-FORMOTEROL FUMARATE 80-4.5 MCG/ACT IN AERO   Inhalation   Inhale 2 puffs into the lungs 2 (two) times daily.   1 Inhaler   12   . EZETIMIBE 10 MG PO TABS   Oral   Take 10 mg by mouth daily.          Marland Kitchen FLUTICASONE-SALMETEROL 100-50 MCG/DOSE IN AEPB               . FUROSEMIDE 80 MG PO TABS   Oral   Take 160 mg by mouth 2 (two) times daily.         Marland Kitchen GABAPENTIN 300 MG PO CAPS   Oral   Take 1 capsule (300 mg total) by mouth at bedtime.   30 capsule   11   . GLUCOSE BLOOD VI STRP      Use as instructed   100 each   12   . HYDRALAZINE HCL 25 MG PO TABS      TAKE ONE (1) TABLET(S) THREE (3) TIMES DAILY   90 tablet   5   . INSULIN ISOPHANE & REGULAR (70-30) 100 UNIT/ML [place] SUSP   Subcutaneous   Inject 120 Units into the skin daily with breakfast.   40 mL   12   . ISOSORBIDE DINITRATE 30 MG PO TABS   Oral   Take 30 mg by mouth 2 (two) times daily.          Marland Kitchen LABETALOL HCL 200 MG PO TABS   Oral   Take 100 mg by mouth 2 (two) times daily.          Marland Kitchen LATANOPROST 0.005 % OP SOLN   Both Eyes   Place 1 drop into both eyes at bedtime.         Marland Kitchen METOLAZONE 5 MG PO TABS   Oral   Take 1 tablet (5 mg total) by mouth daily as needed. Daily as needed for weight gain over 221 pounds   30 tablet   3   . MOMETASONE FUROATE 50 MCG/ACT NA SUSP      2 sprays. 2 sprays each nostril each am         . NITROGLYCERIN 0.4 MG SL SUBL   Sublingual   Place 0.4 mg under the tongue every 5 (five) minutes as needed.  For chest pain/MAX 3 doses         . POTASSIUM CHLORIDE CRYS ER 20 MEQ PO TBCR   Oral   Take 1 tablet (20 mEq total) by mouth 3 (three) times daily.   90 tablet   5   . ROSUVASTATIN CALCIUM 40 MG PO TABS   Oral   Take 40 mg by mouth every morning.          Marland Kitchen TRAMADOL HCL 50 MG PO TABS   Oral   Take 1 tablet (50 mg total) by mouth every 4 (four) hours as needed  for pain.   50 tablet   3   . VITAMIN D (ERGOCALCIFEROL) 50000 UNITS PO CAPS   Oral   Take 50,000 Units by mouth 2 (two) times a week. Monday and Thursday           BP 127/48  Pulse 57  Temp 97.8 F (36.6 C) (Oral)  Resp 22  SpO2 93%  Physical Exam  Nursing note and vitals reviewed. Constitutional: She appears well-developed and well-nourished. No distress.       Awake, alert, nontoxic appearance  HENT:  Head: Atraumatic.  Mouth/Throat: Oropharynx is clear and moist.  Eyes: Conjunctivae normal are normal. Right eye exhibits no discharge. Left eye exhibits no discharge.  Neck: Neck supple.  Cardiovascular: Normal rate and regular rhythm.   Pulmonary/Chest: Effort normal. No respiratory distress. She has no wheezes. She has no rales. She exhibits no tenderness.  Abdominal: Soft. There is tenderness (mild suprapubic tenderness without guarding or rebound.  No hernia or overlying skin changes.  No CVA tenderness). There is no rebound and no guarding.  Musculoskeletal: She exhibits no tenderness.       ROM appears intact, no obvious focal weakness  Neurological: She is alert.       Mental status and motor strength appears intact  Skin: No rash noted.  Psychiatric: She has a normal mood and affect.    ED Course  Procedures (including critical care time)  Labs Reviewed  GLUCOSE, CAPILLARY - Abnormal; Notable for the following:    Glucose-Capillary 136 (*)     All other components within normal limits   No results found.   No diagnosis found.   Date: 05/30/2012  Rate: 54  Rhythm: normal sinus rhythm  QRS Axis: normal  Intervals: QT prolonged  ST/T Wave abnormalities: nonspecific T wave changes  Conduction Disutrbances:nonspecific intraventricular conduction delay  Narrative Interpretation:   Old EKG Reviewed: more prominent ST depression in inferior leads and V2-V3  Results for orders placed during the hospital encounter of 05/30/12  GLUCOSE, CAPILLARY       Component Value Range   Glucose-Capillary 136 (*) 70 - 99 mg/dL  CBC WITH DIFFERENTIAL      Component Value Range   WBC 9.5  4.0 - 10.5 K/uL   RBC 5.33 (*) 3.87 - 5.11 MIL/uL   Hemoglobin 15.6 (*) 12.0 - 15.0 g/dL   HCT 16.1 (*) 09.6 - 04.5 %   MCV 88.7  78.0 - 100.0 fL   MCH 29.3  26.0 - 34.0 pg   MCHC 33.0  30.0 - 36.0 g/dL   RDW 40.9  81.1 - 91.4 %   Platelets 274  150 - 400 K/uL   Neutrophils Relative 84 (*) 43 - 77 %   Neutro Abs 8.0 (*) 1.7 - 7.7 K/uL   Lymphocytes Relative 11 (*) 12 - 46 %   Lymphs Abs 1.0  0.7 - 4.0  K/uL   Monocytes Relative 5  3 - 12 %   Monocytes Absolute 0.5  0.1 - 1.0 K/uL   Eosinophils Relative 0  0 - 5 %   Eosinophils Absolute 0.0  0.0 - 0.7 K/uL   Basophils Relative 0  0 - 1 %   Basophils Absolute 0.0  0.0 - 0.1 K/uL  COMPREHENSIVE METABOLIC PANEL      Component Value Range   Sodium 139  135 - 145 mEq/L   Potassium 3.3 (*) 3.5 - 5.1 mEq/L   Chloride 97  96 - 112 mEq/L   CO2 31  19 - 32 mEq/L   Glucose, Bld 66 (*) 70 - 99 mg/dL   BUN 23  6 - 23 mg/dL   Creatinine, Ser 3.66 (*) 0.50 - 1.10 mg/dL   Calcium 9.0  8.4 - 44.0 mg/dL   Total Protein 6.9  6.0 - 8.3 g/dL   Albumin 3.2 (*) 3.5 - 5.2 g/dL   AST 25  0 - 37 U/L   ALT 19  0 - 35 U/L   Alkaline Phosphatase 93  39 - 117 U/L   Total Bilirubin 1.4 (*) 0.3 - 1.2 mg/dL   GFR calc non Af Amer 30 (*) >90 mL/min   GFR calc Af Amer 35 (*) >90 mL/min  LIPASE, BLOOD      Component Value Range   Lipase 18  11 - 59 U/L  PROTIME-INR      Component Value Range   Prothrombin Time 13.4  11.6 - 15.2 seconds   INR 1.03  0.00 - 1.49  LACTIC ACID, PLASMA      Component Value Range   Lactic Acid, Venous 1.7  0.5 - 2.2 mmol/L  URINALYSIS, ROUTINE W REFLEX MICROSCOPIC      Component Value Range   Color, Urine AMBER (*) YELLOW   APPearance CLOUDY (*) CLEAR   Specific Gravity, Urine 1.027  1.005 - 1.030   pH 6.0  5.0 - 8.0   Glucose, UA 100 (*) NEGATIVE mg/dL   Hgb urine dipstick NEGATIVE  NEGATIVE    Bilirubin Urine SMALL (*) NEGATIVE   Ketones, ur TRACE (*) NEGATIVE mg/dL   Protein, ur 347 (*) NEGATIVE mg/dL   Urobilinogen, UA 1.0  0.0 - 1.0 mg/dL   Nitrite NEGATIVE  NEGATIVE   Leukocytes, UA SMALL (*) NEGATIVE  TROPONIN I      Component Value Range   Troponin I <0.30  <0.30 ng/mL  PRO B NATRIURETIC PEPTIDE      Component Value Range   Pro B Natriuretic peptide (BNP) 3213.0 (*) 0 - 450 pg/mL  URINE MICROSCOPIC-ADD ON      Component Value Range   Squamous Epithelial / LPF RARE  RARE   WBC, UA 3-6  <3 WBC/hpf   RBC / HPF 0-2  <3 RBC/hpf   Bacteria, UA RARE  RARE   Urine-Other AMORPHOUS URATES/PHOSPHATES     Ct Abdomen Pelvis Wo Contrast  05/30/2012  *RADIOLOGY REPORT*  Clinical Data: Abdominal pain, IV contrast allergy  CT ABDOMEN AND PELVIS WITHOUT CONTRAST  Technique:  Multidetector CT imaging of the abdomen and pelvis was performed following the standard protocol without intravenous contrast.  Comparison: CT abdomen 09/19/2008  Findings: Lung bases are clear.  No pericardial fluid.  Valvular calcification noted.  No focal hepatic lesion on noncontrast exam.  Gallbladder clips within the gallbladder fossa.  Pancreas, spleen, adrenal glands, kidneys are normal.  The stomach is normal.  Hiatal hernia is present.  The small bowel and colon are normal.  The cecum is midline.  Appendix is not identified.  Contrast flows in entirety of the bowel to the rectum.  Abdominal aorta normal caliber.  No retroperitoneal periportal lymphadenopathy.  Uterus and ovaries are normal.  The bladder is normal.  No pelvic lymphadenopathy. Review of  bone windows demonstrates no aggressive osseous lesions.  IMPRESSION:  1.  No acute abdominal or pelvic findings.  No change from prior. 2.  Cholecystectomy   Original Report Authenticated By: Genevive Bi, M.D.    Dg Chest 2 View  05/30/2012  *RADIOLOGY REPORT*  Clinical Data: Cough.COPD.  Congestive heart failure.  Coronary artery disease.  CHEST - 2 VIEW   Comparison: 06/27/2011  Findings: Mild cardiomegaly stable.  Both lungs are clear.  No evidence of pleural effusion.  No mass or lymphadenopathy identified.  IMPRESSION: Stable cardiomegaly.  No active lung disease.   Original Report Authenticated By: Myles Rosenthal, M.D.     1. Abdominal pain 2. Diarrhea 3. Hypoglycemia 4. CHF 5. Acute renal failure  MDM  Lower abdominal pain with diarrhea.  Hx of diverticulosis.  Will obtain abd/pelvis CT for further evaluation.    Has non productive cough and increased SOB x 2 days.  Will check CXR, ECG, trop.  Care discussed with my attending.  6:30 PM Work up today has been unremarkable.  Pt does have elevated ProBNP of 3213, however CXR shows no evidence of edema.  ECG with new ST depression to inferior leads and anterolateral leads.  First troponin negative..  Pt has hx of CHF and currently taking Lasix.  Therefore, i recommend close f/u with her PCP for further care.    K+ 3.3 supplementation given.  Pt has diarrhea x 3 days.  No recent abx use.  Pt reports she is not able to care for herself at home with her diarrhea.  Her Cr is 1.56 today, it ws 1.1 six months ago.  BP 127/48  Pulse 57  Temp 97.8 F (36.6 C) (Oral)  Resp 22  SpO2 93%  I have reviewed nursing notes and vital signs. I personally reviewed the imaging tests through PACS system  I reviewed available ER/hospitalization records thought the EMR   7:36 PM Pt felt dizzy while ambulating.  CBG was checked and it was 49.  Will give orange juice and 1 amp of D50.  Sats remains 94 with supplemental O2.    7:58 PM I have consulted with [facility], who agrees to admit pt to tele bed, Team 8.  He also request to check a c.diff.    BP 139/60  Pulse 65  Temp 98 F (36.7 C) (Oral)  Resp 13  SpO2 96%  I have reviewed nursing notes and vital signs. I personally reviewed the imaging tests through PACS system  I reviewed available ER/hospitalization records thought the  EMR  8:19 PM Pt with SOB but did mentioned anginal equivalent cp. No CP here, but does endorse SOB. Since there are ECG changes, [facility] request consulting cardiology for further care.  WIll consult cardiology.    CRITICAL CARE Performed by: Fayrene Helper   Total critical care time: 30 min  Critical care time was exclusive of separately billable procedures and treating other patients.  Critical care was necessary to treat or prevent imminent or life-threatening deterioration.  Critical care was time spent personally by me on the following activities: development of treatment plan with patient and/or surrogate as well as nursing, discussions with consultants,  evaluation of patient's response to treatment, examination of patient, obtaining history from patient or surrogate, ordering and performing treatments and interventions, ordering and review of laboratory studies, ordering and review of radiographic studies, pulse oximetry and re-evaluation of patient's condition.   Fayrene Helper, PA-C 05/30/12 1959  Fayrene Helper, PA-C 05/30/12 2044

## 2012-05-30 NOTE — ED Notes (Signed)
Critcial CKMB reported by Susie in lab. Result 6.5. Eden Emms, RN notified.

## 2012-05-30 NOTE — Progress Notes (Signed)
Pt arrives from ED with same pain as nurse reported 5/10. She states she got little relief from Ntg SL. She feels she needs some pain medication. Oxygen placed on pt at 2LPM and hospitalist paged. Pt alert and pleasant, states this chest pain has been nagging for more than 24 hours. She has taken SL Ntg at home and found some relief, slept and awoke dizzy. Reassured. VSS. NSR 65 on telemetry.

## 2012-05-30 NOTE — ED Notes (Signed)
Attempted to call report. Amy, RN busy.

## 2012-05-31 ENCOUNTER — Encounter (HOSPITAL_COMMUNITY): Payer: Self-pay

## 2012-05-31 DIAGNOSIS — I059 Rheumatic mitral valve disease, unspecified: Secondary | ICD-10-CM

## 2012-05-31 DIAGNOSIS — R079 Chest pain, unspecified: Secondary | ICD-10-CM

## 2012-05-31 DIAGNOSIS — R109 Unspecified abdominal pain: Secondary | ICD-10-CM

## 2012-05-31 DIAGNOSIS — R197 Diarrhea, unspecified: Secondary | ICD-10-CM

## 2012-05-31 LAB — HEMOGLOBIN A1C
Hgb A1c MFr Bld: 10.7 % — ABNORMAL HIGH (ref <5.7)
Mean Plasma Glucose: 260 mg/dL — ABNORMAL HIGH (ref <117)

## 2012-05-31 LAB — COMPREHENSIVE METABOLIC PANEL
ALT: 16 U/L (ref 0–35)
AST: 20 U/L (ref 0–37)
Albumin: 2.7 g/dL — ABNORMAL LOW (ref 3.5–5.2)
Calcium: 8.3 mg/dL — ABNORMAL LOW (ref 8.4–10.5)
Sodium: 133 mEq/L — ABNORMAL LOW (ref 135–145)
Total Protein: 5.5 g/dL — ABNORMAL LOW (ref 6.0–8.3)

## 2012-05-31 LAB — GLUCOSE, CAPILLARY: Glucose-Capillary: 197 mg/dL — ABNORMAL HIGH (ref 70–99)

## 2012-05-31 LAB — TROPONIN I
Troponin I: <0.3 ng/mL (ref <0.30)
Troponin I: <0.3 ng/mL (ref <0.30)

## 2012-05-31 MED ORDER — INSULIN NPH ISOPHANE & REGULAR (70-30) 100 UNIT/ML ~~LOC~~ SUSP: 60.0000 [IU] | Freq: Every day | SUBCUTANEOUS | Status: DC

## 2012-05-31 MED ORDER — SODIUM CHLORIDE 0.9 % IV SOLN
INTRAVENOUS | Status: AC
  Administered 2012-05-31: 50 mL/h via INTRAVENOUS
  Administered 2012-06-01: 01:00:00 via INTRAVENOUS

## 2012-05-31 MED ORDER — BIOTENE DRY MOUTH MT LIQD
15.0000 mL | Freq: Two times a day (BID) | OROMUCOSAL | Status: DC
  Administered 2012-05-31 – 2012-06-02 (×3): 15 mL via OROMUCOSAL

## 2012-05-31 NOTE — Progress Notes (Signed)
   Subjective:  Denies CP or dyspnea; complains of diarrhea   Objective:  Filed Vitals:   05/30/12 2137 05/30/12 2236 05/31/12 0452 05/31/12 0500  BP: 148/66 160/70 117/57   Pulse: 64 70 73   Temp:  97.4 F (36.3 C) 98 F (36.7 C)   TempSrc:  Oral Axillary   Resp: 21 20 18    Height:  5\' 6"  (1.676 m)    Weight:  215 lb 9.8 oz (97.8 kg)  216 lb 0.8 oz (98 kg)  SpO2: 96% 100% 100%     Intake/Output from previous day:  Intake/Output Summary (Last 24 hours) at 05/31/12 0739 Last data filed at 05/31/12 0600  Gross per 24 hour  Intake      0 ml  Output    120 ml  Net   -120 ml    Physical Exam: Physical exam: Well-developed well-nourished in no acute distress.  Skin is warm and dry.  HEENT is normal.  Neck is supple. No thyromegaly.  Chest is clear to auscultation with normal expansion.  Cardiovascular exam is regular rate and rhythm.  Abdominal exam nontender or distended. No masses palpated. Extremities show trace edema. neuro grossly intact    Lab Results: Basic Metabolic Panel:  Basename 05/31/12 0335 05/30/12 2025 05/30/12 1432  NA 133* -- 139  K 3.0* -- 3.3*  CL 94* -- 97  CO2 27 -- 31  GLUCOSE 162* -- 66*  BUN 27* -- 23  CREATININE 1.67* -- 1.56*  CALCIUM 8.3* -- 9.0  MG -- 1.6 --  PHOS -- -- --   CBC:  Basename 05/30/12 1432  WBC 9.5  NEUTROABS 8.0*  HGB 15.6*  HCT 47.3*  MCV 88.7  PLT 274   Cardiac Enzymes:  Basename 05/31/12 0335 05/30/12 2025 05/30/12 1432  CKTOTAL -- 425* --  CKMB -- 6.5* --  CKMBINDEX -- -- --  TROPONINI <0.30 <0.30 <0.30     Assessment/Plan:  1 chest pain-the patient has ruled out. I have reviewed her electrocardiogram. She has had some of those T-wave changes on previous tracings. Her main complaint at the time she presented to the emergency room with diarrhea. We will plan a Lexiscan Myoview tomorrow morning for risk stratification. 2 diarrhea-management per primary care. 3 chronic diastolic congestive heart  failure-diuretics on hold because of diarrhea. Need to follow exam closely as she has had significant diastolic congestive heart failure previously. Resume preadmission dose of diuretics at discharge. 4 acute on chronic renal insufficiency-follow renal function closely. 5 hypertension-continue present blood pressure medications. 6 diabetes mellitus-management per primary care. 7 hyperlipidemia 8 hypokalemia-supplement  Olga Millers 05/31/2012, 7:39 AM

## 2012-05-31 NOTE — Progress Notes (Signed)
*  PRELIMINARY RESULTS* Echocardiogram 2D Echocardiogram has been performed.  [name] 05/31/2012, 9:56 [facility]   2D echo completed 05/31/2012.   Veda Canning, RDCS

## 2012-05-31 NOTE — Progress Notes (Addendum)
[facility] PROGRESS NOTE  [name] ZOX:[id number] DOB: [birth year] DOA: 05/30/2012 PCP: Romero Belling, MD  Assessment/Plan: Chest pain at rest Troponin negative, patient ruled out for acute coronary syndrome.  Appreciate cardiology input.  2-D echocardiogram done with results as indicated below.  Cardiology planning on myocardial perfusion scan tomorrow for risk stratification. Continue her beta blocker, isosorbide dinitrate, and aspirin.  Type 2 diabetes mellitus, uncontrolled, with renal complications  Hemoglobin A1c on 05/30/2012 was 10.7, suggesting an average blood sugar of 260.  Restart home NPH (70/30) at half home dose tomorrow.  Sliding scale insulin.  Continue diabetic diet.  Has had few episodes of hypoglycemia at the time of admission.  Hypokalemia  Continue replacement, likely due to diarrhea.  Hold furosemide for now.  Hypertension  Stable, continue labetalol.  Hold diuretics due to her acute renal failure.  Continued hydralazine and isosorbide dinitrate.  Continue gentle hydration with IV fluids.  Acute renal failure on chronic kidney disease stage III Hold diuretics including furosemide and metolazone.  Continue to monitor.  Gently hydrate the patient on IV fluids.  Abdominal pain/Diarrhea Etiology unclear.  CT of the abdomen and pelvis with contrast on 05/30/2012 showed no acute abdominal findings, no change from prior imaging, status post cholecystectomy.  Stool culture pending.  C. difficile has not been sent but pending.  Continue to monitor.  Hyperlipidemia Continue statin.  Restart ezetimibe at discharge.   Chronic systolic and diastolic congestive heart failure Patient appears compensated.  2-D echocardiogram on 05/31/2012 short systolic function was mildly to moderately reduced, EF 40-45%, diffuse hypokinesis.  Patient not on ACE/ARB DUE to chronic kidney disease.  Code Status: Full code Family Communication: No family at the bedside.   Disposition Plan: Pending, cardiology workup and improvement in diarrhea/abdominal pain.  Consultants:  Cardiology, Dr. Jens Som  Procedures: 2-D echocardiogram on 05/31/2012 Study Conclusions - Left ventricle: The cavity size was normal. Wall thickness was increased in a pattern of severe LVH. Systolic function was mildly to moderately reduced. The estimated ejection fraction was in the range of 40% to 45%. Diffuse hypokinesis. Doppler parameters are consistent with abnormal left ventricular relaxation (grade 1 diastolic dysfunction). Doppler parameters are consistent with elevated ventricular end-diastolic filling pressure. - Aortic valve: Mildly to moderately calcified annulus. Trileaflet; mildly calcified leaflets. Moderate calcification involving the noncoronary cusp. Noncoronary cusp mobility was severely restricted. There was mild to moderate stenosis. Although velocity was not significantly increased, planimetry indicates valve area approximately 1.3 cm2. No significant regurgitation. - Mitral valve: Calcified annulus. Mildly thickened leaflets. Mobility was restricted. Mild regurgitation. - Left atrium: The atrium was moderately dilated. - Tricuspid valve: Mildly thickened leaflets. Trivial regurgitation. - Pericardium, extracardiac: There was no pericardial effusion.  Antibiotics:  None   HPI/Subjective: Denies any chest pain or shortness of breath.  Had diarrhea yesterday and during the night.  Objective: Filed Vitals:   05/30/12 2137 05/30/12 2236 05/31/12 0452 05/31/12 0500  BP: 148/66 160/70 117/57   Pulse: 64 70 73   Temp:  97.4 F (36.3 C) 98 F (36.7 C)   TempSrc:  Oral Axillary   Resp: 21 20 18    Height:  5\' 6"  (1.676 m)    Weight:  97.8 kg (215 lb 9.8 oz)  98 kg (216 lb 0.8 oz)  SpO2: 96% 100% 100%     Intake/Output Summary (Last 24 hours) at 05/31/12 0825 Last data filed at 05/31/12 0600  Gross per 24 hour  Intake      0 ml  Output    120 ml  Net    -120 ml   Filed Weights   05/30/12 2236 05/31/12 0500  Weight: 97.8 kg (215 lb 9.8 oz) 98 kg (216 lb 0.8 oz)    Exam: Physical Exam: General: Awake, Oriented, No acute distress. HEENT: EOMI. Neck: Supple CV: S1 and S2 Lungs: Clear to ascultation bilaterally Abdomen: Soft, Nontender, Nondistended, +bowel sounds. Ext: Good pulses. Trace edema.  Data Reviewed: Basic Metabolic Panel:  Lab 05/31/12 1610 05/30/12 2025 05/30/12 1432  NA 133* -- 139  K 3.0* -- 3.3*  CL 94* -- 97  CO2 27 -- 31  GLUCOSE 162* -- 66*  BUN 27* -- 23  CREATININE 1.67* -- 1.56*  CALCIUM 8.3* -- 9.0  MG -- 1.6 --  PHOS -- -- --   Liver Function Tests:  Lab 05/31/12 0335 05/30/12 1432  AST 20 25  ALT 16 19  ALKPHOS 70 93  BILITOT 0.9 1.4*  PROT 5.5* 6.9  ALBUMIN 2.7* 3.2*    Lab 05/30/12 1432  LIPASE 18  AMYLASE --   No results found for this basename: AMMONIA:5 in the last 168 hours CBC:  Lab 05/30/12 1432  WBC 9.5  NEUTROABS 8.0*  HGB 15.6*  HCT 47.3*  MCV 88.7  PLT 274   Cardiac Enzymes:  Lab 05/31/12 0335 05/30/12 2025 05/30/12 1432  CKTOTAL -- 425* --  CKMB -- 6.5* --  CKMBINDEX -- -- --  TROPONINI <0.30 <0.30 <0.30   BNP (last 3 results)  Basename 05/30/12 1432  PROBNP 3213.0*   CBG:  Lab 05/31/12 0815 05/30/12 2219 05/30/12 2055 05/30/12 1935 05/30/12 1246  GLUCAP 197* 161* 191* 49* 136*    No results found for this or any previous visit (from the past 240 hour(s)).   Studies: Ct Abdomen Pelvis Wo Contrast  05/30/2012  *RADIOLOGY REPORT*  Clinical Data: Abdominal pain, IV contrast allergy  CT ABDOMEN AND PELVIS WITHOUT CONTRAST  Technique:  Multidetector CT imaging of the abdomen and pelvis was performed following the standard protocol without intravenous contrast.  Comparison: CT abdomen 09/19/2008  Findings: Lung bases are clear.  No pericardial fluid.  Valvular calcification noted.  No focal hepatic lesion on noncontrast exam.  Gallbladder clips within the  gallbladder fossa.  Pancreas, spleen, adrenal glands, kidneys are normal.  The stomach is normal.  Hiatal hernia is present.  The small bowel and colon are normal.  The cecum is midline.  Appendix is not identified.  Contrast flows in entirety of the bowel to the rectum.  Abdominal aorta normal caliber.  No retroperitoneal periportal lymphadenopathy.  Uterus and ovaries are normal.  The bladder is normal.  No pelvic lymphadenopathy. Review of  bone windows demonstrates no aggressive osseous lesions.  IMPRESSION:  1.  No acute abdominal or pelvic findings.  No change from prior. 2.  Cholecystectomy   Original Report Authenticated By: Genevive Bi, M.D.    Dg Chest 2 View  05/30/2012  *RADIOLOGY REPORT*  Clinical Data: Cough.COPD.  Congestive heart failure.  Coronary artery disease.  CHEST - 2 VIEW  Comparison: 06/27/2011  Findings: Mild cardiomegaly stable.  Both lungs are clear.  No evidence of pleural effusion.  No mass or lymphadenopathy identified.  IMPRESSION: Stable cardiomegaly.  No active lung disease.   Original Report Authenticated By: Myles Rosenthal, M.D.     Scheduled Meds:   . aspirin EC  325 mg Oral Daily  . atorvastatin  80 mg Oral q1800  . heparin  5,000 Units  Subcutaneous Q8H  . hydrALAZINE  25 mg Oral TID  . insulin aspart  0-5 Units Subcutaneous QHS  . insulin aspart  0-9 Units Subcutaneous TID WC  . insulin aspart  3 Units Subcutaneous TID WC  . isosorbide dinitrate  30 mg Oral BID  . labetalol  100 mg Oral BID  . latanoprost  1 drop Both Eyes QHS  . potassium chloride  40 mEq Oral TID  . sodium chloride  3 mL Intravenous Q12H  . Vitamin D (Ergocalciferol)  50,000 Units Oral 2 times weekly   Continuous Infusions:   Principal Problem:  *Chest pain at rest Active Problems:  Type 2 diabetes mellitus, controlled, with renal complications  HYPOKALEMIA  HYPERTENSION  RENAL INSUFFICIENCY  Abdominal pain  Diarrhea  Elevated bilirubin    Time spent:  25    Mialee Weyman A  [facility] Pager [phone]. If 7PM-7AM, please contact night-coverage at [email or web address], password [facility] 05/31/2012, 8:25 AM  LOS: 1 day

## 2012-05-31 NOTE — Consult Note (Signed)
Cardiology Consult Note Romero Belling, MD No ref. provider found  Reason for consult: chest pain, abnormal ecg  History of Present Illness (and review of medical records): [name] is a 78 y.o. female with complex cardiac history as below who presents for evaluation of.  Of note she was just recently seen in clinic with Dr. Daleen Squibb on 12/13.  She states that she has had chest pain on and off since Wednesday.  CP has been mainly with mild exertion and required her to take NTG with relief.  She states pain is similar to her prior episodes. She did not report pain at her visit in clinic.  She mainly presented to the ED with complaints of abdominal pain and diarrhea.  She has had these symptoms since Thursday with no improvement.  She denies nausea/vomiting.  She has had upper respiratory symptoms, with rhinorrhea and sinus congestions.  She was admitted to the Hospitalist service and we were consulted for abnormal ecg and chest pain.  She is chest pain free at the time of my evaluation.  Review of Systems A comprehensive review of systems was negative other than stated in HPI. Patient Active Problem List   Diagnosis Date Noted  . Chest pain at rest 05/30/2012  . Abdominal pain 05/30/2012  . Diarrhea 05/30/2012  . Elevated bilirubin 05/30/2012  . Right groin pain 05/28/2012  . Vitamin d deficiency 02/20/2012  . Encounter for long-term (current) use of other medications 11/21/2011  . Hematuria 01/03/2011  . COUGH 07/06/2010  . POLYURIA 04/02/2010  . HYPOKALEMIA 01/22/2010  . LEG CRAMPS 01/22/2010  . ASTHMA, WITH ACUTE EXACERBATION 12/19/2009  . ABDOMINAL PAIN 10/16/2009  . Chronic diastolic heart failure 06/06/2009  . PROTEINURIA 05/30/2009  . CAROTID BRUIT, LEFT 05/18/2009  . WEIGHT LOSS-ABNORMAL 04/05/2009  . DYSPHAGIA 04/05/2009  . OBSTRUCTIVE SLEEP APNEA 10/12/2008  . PERS HX NONCOMPLIANCE W/MED TX PRS HAZARDS HLTH 09/01/2008  . RENAL INSUFFICIENCY 07/19/2008  . FEVER  UNSPECIFIED 06/29/2008  . ABDOMINAL PAIN, RIGHT UPPER QUADRANT 06/29/2008  . OSTEOPOROSIS 06/17/2008  . FOOT PAIN, BILATERAL 02/16/2008  . EDEMA 12/21/2007  . DYSPNEA 09/03/2007  . HYPERLIPIDEMIA 07/16/2007  . DEPRESSION 07/16/2007  . PERIPHERAL NEUROPATHY 07/16/2007  . ASTHMA 07/16/2007  . COPD 07/16/2007  . PEPTIC ULCER DISEASE 07/16/2007  . Type 2 diabetes mellitus, controlled, with renal complications 01/22/2007  . HYPERTENSION 01/22/2007  . CORONARY ARTERY DISEASE 01/22/2007  . PERIPHERAL VASCULAR DISEASE 01/22/2007  . GERD 01/22/2007  . OSTEOARTHRITIS 01/22/2007   Past Medical History  Diagnosis Date  . Uterine cancer   . Diabetes mellitus     type II; peripheral neuropathy  . Hypertension   . GERD (gastroesophageal reflux disease)   . Peripheral vascular disease   . Obesity   . Dyslipidemia   . Diastolic CHF, chronic     EF 50-55%, mild LVH and grade 1 diast. Dysfxn  . Osteoarthritis cervical spine   . DM retinopathy   . Sleep apnea     with CPAP  . History of shingles   . COPD (chronic obstructive pulmonary disease)   . Depression   . Peptic ulcer disease     duodenal  . Peptic stricture of esophagus   . Hiatal hernia   . Diverticulosis   . Arthritis   . ASCVD (arteriosclerotic cardiovascular disease)     on MRI brin  . CAD (coronary artery disease)     a. s/p multiple caths with nonobs CAD;   b. cath 1/10: pLAD  20%, mLAD 40%, pCFX 20%, mCFX 40%, pRCA 60-70%  . Non-ischemic cardiomyopathy   . Asthma     Mild  . Pruritic condition     Idiopathic  . Hyperlipidemia   . Zoster   . Noncompliance     Past Surgical History  Procedure Date  . Tubal ligation 1967  . Knee arthroscopy 10/1998    Left  . Craniotomy 1997    Left for SDH  . Cataract extraction, bilateral 2005  . Hernia repair   . Esophagogastroduodenoscopy 04/04/2004  . Spine surgery     C-spine and lumbar surgery  . Cholecystectomy 2010  . Cardiac catheterization   . Dexa 7/05  .  Abdominal hysterectomy     Prescriptions prior to admission  Medication Sig Dispense Refill  . acetaminophen (TYLENOL) 500 MG tablet Take 1,000 mg by mouth every 6 (six) hours as needed. For pain      . aspirin EC 325 MG tablet Take 325 mg by mouth daily.      Marland Kitchen ezetimibe (ZETIA) 10 MG tablet Take 10 mg by mouth daily.       . furosemide (LASIX) 80 MG tablet Take 160 mg by mouth 2 (two) times daily.      . hydrALAZINE (APRESOLINE) 25 MG tablet Take 25 mg by mouth 3 (three) times daily.      . insulin NPH-insulin regular (NOVOLIN 70/30) (70-30) 100 UNIT/ML injection Inject 120 Units into the skin daily with breakfast.  40 mL  12  . isosorbide dinitrate (ISORDIL) 30 MG tablet Take 30 mg by mouth 2 (two) times daily.       Marland Kitchen labetalol (NORMODYNE) 200 MG tablet Take 100 mg by mouth 2 (two) times daily.       Marland Kitchen latanoprost (XALATAN) 0.005 % ophthalmic solution Place 1 drop into both eyes at bedtime.      . methylcellulose (ARTIFICIAL TEARS) 1 % ophthalmic solution Place 1 drop into both eyes daily. For dry eyes      . metolazone (ZAROXOLYN) 5 MG tablet Take 1 tablet (5 mg total) by mouth daily as needed. Daily as needed for weight gain over 221 pounds  30 tablet  3  . nitroGLYCERIN (NITROSTAT) 0.4 MG SL tablet Place 0.4 mg under the tongue every 5 (five) minutes as needed. For chest pain/MAX 3 doses      . potassium chloride SA (K-DUR,KLOR-CON) 20 MEQ tablet Take 1 tablet (20 mEq total) by mouth 3 (three) times daily.  90 tablet  5  . rosuvastatin (CRESTOR) 40 MG tablet Take 40 mg by mouth every morning.       . Vitamin D, Ergocalciferol, (DRISDOL) 50000 UNITS CAPS Take 50,000 Units by mouth 2 (two) times a week. Monday and Thursday       Allergies  Allergen Reactions  . Iohexol      Code: RASH, Desc: JENNIFER STATES ON PT'S CHART ALLERGIC TO IV DYE 09/04/07/RM, Onset Date: [id number]     History  Substance Use Topics  . Smoking status: Never Smoker   . Smokeless tobacco: Not on file  . Alcohol  Use: No     Comment: rare    Family History  Problem Relation Age of Onset  . Cancer Mother     "Stomach" Cancer  . Diabetes Mother   . Heart disease Mother   . Stomach cancer Mother   . Lymphoma Father   . Kidney disease Paternal Grandmother   . Asthma Other  Objective: Patient Vitals for the past 8 hrs:  BP Temp Temp src Pulse Resp SpO2 Height Weight  05/30/12 2236 160/70 mmHg 97.4 F (36.3 C) Oral 70  20  100 % 5\' 6"  (1.676 m) 97.8 kg (215 lb 9.8 oz)  05/30/12 2137 148/66 mmHg - - 64  21  96 % - -  05/30/12 2130 - - - 65  16  96 % - -  05/30/12 2123 140/65 mmHg - - 69  8  98 % - -  05/30/12 2115 - - - 69  19  98 % - -  05/30/12 2102 - - - 60  20  95 % - -  05/30/12 2101 - - - 60  20  95 % - -  05/30/12 2100 - - - 60  19  96 % - -  05/30/12 2047 141/66 mmHg - - - 19  - - -  05/30/12 1909 139/60 mmHg 98 F (36.7 C) Oral 65  13  96 % - -   General Appearance:    Alert, cooperative, no distress, appears stated age, obese female, tender to palpation diffusely over abdomen and chest.  Head:    Normocephalic, without obvious abnormality, atraumatic  Eyes:     PERRL, EOMI, anicteric sclerae  Neck:   Supple, no carotid bruit or JVD  Lungs:     Clear to auscultation bilaterally, respirations unlabored  Heart:    Regular rate and rhythm, S1 and S2 normal, no murmurs  Abdomen:     Soft, non-tender, normoactive bowel sounds  Extremities:   Extremities normal, atraumatic, no cyanosis or edema  Pulses:   2+ and symmetric all extremities  Skin:   no rashes or lesions  Neurologic:   No focal deficits. AAO x3   Results for orders placed during the hospital encounter of 05/30/12 (from the past 48 hour(s))  GLUCOSE, CAPILLARY     Status: Abnormal   Collection Time   05/30/12 12:46 PM      Component Value Range Comment   Glucose-Capillary 136 (*) 70 - 99 mg/dL   TSH     Status: Normal   Collection Time   05/30/12  2:23 PM      Component Value Range Comment   TSH 1.576  0.350 -  4.500 uIU/mL   HEMOGLOBIN A1C     Status: Abnormal   Collection Time   05/30/12  2:23 PM      Component Value Range Comment   Hemoglobin A1C 10.7 (*) <5.7 %    Mean Plasma Glucose 260 (*) <117 mg/dL   URINALYSIS, ROUTINE W REFLEX MICROSCOPIC     Status: Abnormal   Collection Time   05/30/12  2:27 PM      Component Value Range Comment   Color, Urine AMBER (*) YELLOW BIOCHEMICALS MAY BE AFFECTED BY COLOR   APPearance CLOUDY (*) CLEAR    Specific Gravity, Urine 1.027  1.005 - 1.030    pH 6.0  5.0 - 8.0    Glucose, UA 100 (*) NEGATIVE mg/dL    Hgb urine dipstick NEGATIVE  NEGATIVE    Bilirubin Urine SMALL (*) NEGATIVE    Ketones, ur TRACE (*) NEGATIVE mg/dL    Protein, ur 409 (*) NEGATIVE mg/dL    Urobilinogen, UA 1.0  0.0 - 1.0 mg/dL    Nitrite NEGATIVE  NEGATIVE    Leukocytes, UA SMALL (*) NEGATIVE   URINE MICROSCOPIC-ADD ON     Status: Normal   Collection Time  05/30/12  2:27 PM      Component Value Range Comment   Squamous Epithelial / LPF RARE  RARE    WBC, UA 3-6  <3 WBC/hpf    RBC / HPF 0-2  <3 RBC/hpf    Bacteria, UA RARE  RARE    Urine-Other AMORPHOUS URATES/PHOSPHATES     CBC WITH DIFFERENTIAL     Status: Abnormal   Collection Time   05/30/12  2:32 PM      Component Value Range Comment   WBC 9.5  4.0 - 10.5 K/uL    RBC 5.33 (*) 3.87 - 5.11 MIL/uL    Hemoglobin 15.6 (*) 12.0 - 15.0 g/dL    HCT 16.1 (*) 09.6 - 46.0 %    MCV 88.7  78.0 - 100.0 fL    MCH 29.3  26.0 - 34.0 pg    MCHC 33.0  30.0 - 36.0 g/dL    RDW 04.5  40.9 - 81.1 %    Platelets 274  150 - 400 K/uL    Neutrophils Relative 84 (*) 43 - 77 %    Neutro Abs 8.0 (*) 1.7 - 7.7 K/uL    Lymphocytes Relative 11 (*) 12 - 46 %    Lymphs Abs 1.0  0.7 - 4.0 K/uL    Monocytes Relative 5  3 - 12 %    Monocytes Absolute 0.5  0.1 - 1.0 K/uL    Eosinophils Relative 0  0 - 5 %    Eosinophils Absolute 0.0  0.0 - 0.7 K/uL    Basophils Relative 0  0 - 1 %    Basophils Absolute 0.0  0.0 - 0.1 K/uL   COMPREHENSIVE  METABOLIC PANEL     Status: Abnormal   Collection Time   05/30/12  2:32 PM      Component Value Range Comment   Sodium 139  135 - 145 mEq/L    Potassium 3.3 (*) 3.5 - 5.1 mEq/L    Chloride 97  96 - 112 mEq/L    CO2 31  19 - 32 mEq/L    Glucose, Bld 66 (*) 70 - 99 mg/dL    BUN 23  6 - 23 mg/dL    Creatinine, Ser 9.14 (*) 0.50 - 1.10 mg/dL    Calcium 9.0  8.4 - 78.2 mg/dL    Total Protein 6.9  6.0 - 8.3 g/dL    Albumin 3.2 (*) 3.5 - 5.2 g/dL    AST 25  0 - 37 U/L    ALT 19  0 - 35 U/L    Alkaline Phosphatase 93  39 - 117 U/L    Total Bilirubin 1.4 (*) 0.3 - 1.2 mg/dL    GFR calc non Af Amer 30 (*) >90 mL/min    GFR calc Af Amer 35 (*) >90 mL/min   LIPASE, BLOOD     Status: Normal   Collection Time   05/30/12  2:32 PM      Component Value Range Comment   Lipase 18  11 - 59 U/L   PROTIME-INR     Status: Normal   Collection Time   05/30/12  2:32 PM      Component Value Range Comment   Prothrombin Time 13.4  11.6 - 15.2 seconds    INR 1.03  0.00 - 1.49   LACTIC ACID, PLASMA     Status: Normal   Collection Time   05/30/12  2:32 PM      Component Value Range Comment  Lactic Acid, Venous 1.7  0.5 - 2.2 mmol/L   TROPONIN I     Status: Normal   Collection Time   05/30/12  2:32 PM      Component Value Range Comment   Troponin I <0.30  <0.30 ng/mL   PRO B NATRIURETIC PEPTIDE     Status: Abnormal   Collection Time   05/30/12  2:32 PM      Component Value Range Comment   Pro B Natriuretic peptide (BNP) 3213.0 (*) 0 - 450 pg/mL   GLUCOSE, CAPILLARY     Status: Abnormal   Collection Time   05/30/12  7:35 PM      Component Value Range Comment   Glucose-Capillary 49 (*) 70 - 99 mg/dL   CK TOTAL AND CKMB     Status: Abnormal   Collection Time   05/30/12  8:25 PM      Component Value Range Comment   Total CK 425 (*) 7 - 177 U/L    CK, MB 6.5 (*) 0.3 - 4.0 ng/mL    Relative Index 1.5  0.0 - 2.5   TROPONIN I     Status: Normal   Collection Time   05/30/12  8:25 PM       Component Value Range Comment   Troponin I <0.30  <0.30 ng/mL   BILIRUBIN, DIRECT     Status: Normal   Collection Time   05/30/12  8:25 PM      Component Value Range Comment   Bilirubin, Direct 0.2  0.0 - 0.3 mg/dL   MAGNESIUM     Status: Normal   Collection Time   05/30/12  8:25 PM      Component Value Range Comment   Magnesium 1.6  1.5 - 2.5 mg/dL   GLUCOSE, CAPILLARY     Status: Abnormal   Collection Time   05/30/12  8:55 PM      Component Value Range Comment   Glucose-Capillary 191 (*) 70 - 99 mg/dL   GLUCOSE, CAPILLARY     Status: Abnormal   Collection Time   05/30/12 10:19 PM      Component Value Range Comment   Glucose-Capillary 161 (*) 70 - 99 mg/dL    Ct Abdomen Pelvis Wo Contrast  05/30/2012  *RADIOLOGY REPORT*  Clinical Data: Abdominal pain, IV contrast allergy  CT ABDOMEN AND PELVIS WITHOUT CONTRAST  Technique:  Multidetector CT imaging of the abdomen and pelvis was performed following the standard protocol without intravenous contrast.  Comparison: CT abdomen 09/19/2008  Findings: Lung bases are clear.  No pericardial fluid.  Valvular calcification noted.  No focal hepatic lesion on noncontrast exam.  Gallbladder clips within the gallbladder fossa.  Pancreas, spleen, adrenal glands, kidneys are normal.  The stomach is normal.  Hiatal hernia is present.  The small bowel and colon are normal.  The cecum is midline.  Appendix is not identified.  Contrast flows in entirety of the bowel to the rectum.  Abdominal aorta normal caliber.  No retroperitoneal periportal lymphadenopathy.  Uterus and ovaries are normal.  The bladder is normal.  No pelvic lymphadenopathy. Review of  bone windows demonstrates no aggressive osseous lesions.  IMPRESSION:  1.  No acute abdominal or pelvic findings.  No change from prior. 2.  Cholecystectomy   Original Report Authenticated By: Genevive Bi, M.D.    Dg Chest 2 View  05/30/2012  *RADIOLOGY REPORT*  Clinical Data: Cough.COPD.  Congestive heart  failure.  Coronary artery disease.  CHEST - 2  VIEW  Comparison: 06/27/2011  Findings: Mild cardiomegaly stable.  Both lungs are clear.  No evidence of pleural effusion.  No mass or lymphadenopathy identified.  IMPRESSION: Stable cardiomegaly.  No active lung disease.   Original Report Authenticated By: Myles Rosenthal, M.D.     ECG: sinus rhythm 54 new TWA inferolateral leads  ECHO 11/2008 Study Conclusions 1. Left ventricle: Wall thickness was increased in a pattern of moderate LVH. The estimated ejection fraction was 65%. Regional wall motion abnormalities cannot be excluded. 2. Aortic valve: Mildly thickened leaflets. Cusp separation was at the lower limits of normal. 3. Mitral valve: Mildly to moderately calcified annulus. 4. Left atrium: The atrium was mildly dilated.  Impression: 6F with hx of diastolic dysfunction, nonobstructie CAD, PVD, HTN, HLD, DM, OSA p/w chest pain with new TWA on ecg with negative troponins in setting of abdominal pain and diarrhea illness.  Recommendations: 1.  Continue to monitor on telemetry 2.  EKG prn chest pain or arrythmia 3.  Cycle troponins to r/o MI 4.  Agree with TTE to assess new wall motion abnormalities. 4.  Continue home meds including ASA, BB, ARB, Statin, Nitro 5.  Titirate Imdur as BP will allow 6.  Rule out primary GI illness that maybe attributing to demand ischemia 7.  May consider further non-invasive assessment pending initial studies and clinical course.  Thank you for this consult.

## 2012-05-31 NOTE — ED Provider Notes (Signed)
Medical screening examination/treatment/procedure(s) were performed by non-physician practitioner and as supervising physician I was immediately available for consultation/collaboration.  Lower abdominal pain with diarrhea since yesterday.  Also intermittent chest pain at rest, just saw Dr. Daleen Squibb 12/3.  New T wave inversions infero laterally. Troponin negative.   Glynn Octave, MD 05/31/12 [phone]

## 2012-05-31 NOTE — Progress Notes (Signed)
Pt rested well after first dose of morphine, she now states that chest pain is returning-Morphine repeated. Urine sent to lab. Call to lab Cdiff PCR not sent as reported by ED nurse. Will send when pt stools again, she has not had BM since arrival to unit.   Pt states her family has been busy with holiday activities and she knows she has not been doing well with her low sodium diet. She has been in contact with cardiac nurse that assesses her CHF, and she is aware that fluid has been worse since Thanksgiving.

## 2012-06-01 DIAGNOSIS — R079 Chest pain, unspecified: Secondary | ICD-10-CM

## 2012-06-01 LAB — GLUCOSE, CAPILLARY
Glucose-Capillary: 230 mg/dL — ABNORMAL HIGH (ref 70–99)
Glucose-Capillary: 267 mg/dL — ABNORMAL HIGH (ref 70–99)
Glucose-Capillary: 210 mg/dL — ABNORMAL HIGH (ref 70–99)
Glucose-Capillary: 198 mg/dL — ABNORMAL HIGH (ref 70–99)

## 2012-06-01 LAB — COMPREHENSIVE METABOLIC PANEL
ALT: 16 U/L (ref 0–35)
Alkaline Phosphatase: 78 U/L (ref 39–117)
BUN: 31 mg/dL — ABNORMAL HIGH (ref 6–23)
CO2: 23 mEq/L (ref 19–32)
GFR calc Af Amer: 36 mL/min — ABNORMAL LOW (ref >90)
GFR calc non Af Amer: 31 mL/min — ABNORMAL LOW (ref >90)
Glucose, Bld: 234 mg/dL — ABNORMAL HIGH (ref 70–99)
Potassium: 4.8 mEq/L (ref 3.5–5.1)
Sodium: 135 mEq/L (ref 135–145)
Total Protein: 6.1 g/dL (ref 6.0–8.3)

## 2012-06-01 LAB — CBC
HCT: 42.8 % (ref 36.0–46.0)
Hemoglobin: 13.8 g/dL (ref 12.0–15.0)
MCH: 29.1 pg (ref 26.0–34.0)
MCHC: 32.2 g/dL (ref 30.0–36.0)
RBC: 4.75 MIL/uL (ref 3.87–5.11)

## 2012-06-01 LAB — MAGNESIUM: Magnesium: 1.6 mg/dL (ref 1.5–2.5)

## 2012-06-01 MED ORDER — INSULIN ASPART PROT & ASPART (70-30 MIX) 100 UNIT/ML ~~LOC~~ SUSP: 60.0000 [IU] | Freq: Every day | SUBCUTANEOUS | Status: DC

## 2012-06-01 MED ORDER — INSULIN ASPART PROT & ASPART (70-30 MIX) 100 UNIT/ML ~~LOC~~ SUSP
60.0000 [IU] | Freq: Every day | SUBCUTANEOUS | Status: DC
  Filled 2012-06-01: qty 10

## 2012-06-01 MED ORDER — OXYMETAZOLINE HCL 0.05 % NA SOLN
1.0000 | Freq: Two times a day (BID) | NASAL | Status: DC
  Filled 2012-06-01: qty 15

## 2012-06-01 MED ORDER — FUROSEMIDE 40 MG PO TABS
40.0000 mg | ORAL_TABLET | Freq: Two times a day (BID) | ORAL | Status: DC
  Filled 2012-06-01 (×2): qty 1

## 2012-06-01 MED ORDER — GUAIFENESIN ER 600 MG PO TB12
600.0000 mg | ORAL_TABLET | Freq: Two times a day (BID) | ORAL | Status: DC
  Administered 2012-06-01 – 2012-06-02 (×3): 600 mg via ORAL
  Filled 2012-06-01 (×4): qty 1

## 2012-06-01 MED ORDER — GUAIFENESIN-DM 100-10 MG/5ML PO SYRP: 5.0000 mL | ORAL_SOLUTION | ORAL | Status: DC | PRN

## 2012-06-01 MED ORDER — INSULIN ASPART PROT & ASPART (70-30 MIX) 100 UNIT/ML ~~LOC~~ SUSP
60.0000 [IU] | Freq: Every day | SUBCUTANEOUS | Status: DC
  Filled 2012-06-01: qty 3

## 2012-06-01 MED ORDER — INSULIN ASPART PROT & ASPART (70-30 MIX) 100 UNIT/ML ~~LOC~~ SUSP
60.0000 [IU] | Freq: Every day | SUBCUTANEOUS | Status: DC
Start: 2012-06-02 — End: 2012-06-01

## 2012-06-01 MED ORDER — INSULIN NPH ISOPHANE & REGULAR (70-30) 100 UNIT/ML ~~LOC~~ SUSP: 80.0000 [IU] | Freq: Every day | SUBCUTANEOUS | Status: DC

## 2012-06-01 NOTE — Progress Notes (Signed)
   Subjective:  Denies CP or dyspnea; Diarrhea improved.   Objective:  Filed Vitals:   05/31/12 0500 05/31/12 1430 05/31/12 2046 06/01/12 0626  BP:  127/60 117/66 124/56  Pulse:  72 74 85  Temp:  97.7 F (36.5 C) 97.5 F (36.4 C) 97 F (36.1 C)  TempSrc:  Oral Oral Oral  Resp:  18 18 20   Height:      Weight: 216 lb 0.8 oz (98 kg)   214 lb 1.1 oz (97.1 kg)  SpO2:  99% 100% 95%    Intake/Output from previous day:  Intake/Output Summary (Last 24 hours) at 06/01/12 0709 Last data filed at 06/01/12 0313  Gross per 24 hour  Intake 1917.08 ml  Output    300 ml  Net 1617.08 ml    Physical Exam: Physical exam: Well-developed well-nourished in no acute distress.  Skin is warm and dry.  HEENT is normal.  Neck is supple.  Chest is clear to auscultation with normal expansion.  Cardiovascular exam is regular rate and rhythm.  Abdominal exam nontender or distended. No masses palpated. Extremities show trace edema. neuro grossly intact    Lab Results: Basic Metabolic Panel:  Basename 06/01/12 0514 05/31/12 0335 05/30/12 2025  NA 135 133* --  K 4.8 3.0* --  CL 99 94* --  CO2 23 27 --  GLUCOSE 234* 162* --  BUN 31* 27* --  CREATININE 1.53* 1.67* --  CALCIUM 8.7 8.3* --  MG 1.6 -- 1.6  PHOS -- -- --   CBC:  Basename 06/01/12 0514 05/30/12 1432  WBC 8.2 9.5  NEUTROABS -- 8.0*  HGB 13.8 15.6*  HCT 42.8 47.3*  MCV 90.1 88.7  PLT 223 274   Cardiac Enzymes:  Basename 05/31/12 0922 05/31/12 0335 05/30/12 2025  CKTOTAL -- -- 425*  CKMB -- -- 6.5*  CKMBINDEX -- -- --  TROPONINI <0.30 <0.30 <0.30   Echo shows EF 40-45, mild to moderate AS, mild MR and moderate LAE  Assessment/Plan:  1 chest pain-the patient has ruled out. I have reviewed her electrocardiogram. She has had some of those T-wave changes on previous tracings. Her main complaint at the time she presented to the emergency room with diarrhea. We will plan a Lexiscan Myoview this morning for risk  stratification, particularly in light of mildly reduced LV function. If normal or low risk, patient can be DCed from a cardiac standpoint and fu Dr Daleen Squibb. She would be high risk for contrast nephropathy if cath ever performed. 2 diarrhea-management per primary care; improved. 3 chronic diastolic congestive heart failure-diuretics on hold because of diarrhea. Need to follow exam closely as she has had significant diastolic congestive heart failure previously. Resume preadmission dose of diuretics at discharge. Repeat BMET one week later with results to Dr Daleen Squibb. 4 acute on chronic renal insufficiency-follow renal function closely. 5 hypertension-continue present blood pressure medications. 6 diabetes mellitus-management per primary care. 7 hyperlipidemia 8 hypokalemia-resolved; hold KCL while diuretics on hold 9 Mild to moderate AS  Olga Millers 06/01/2012, 7:09 AM

## 2012-06-01 NOTE — Progress Notes (Signed)
   CARE MANAGEMENT NOTE 06/01/2012  Patient:  [name], [name]   Account Number:  [id number]  Date Initiated:  06/01/2012  Documentation initiated by:  Jiles Crocker  Subjective/Objective Assessment:   ADMITTED WITH ABDOMINAL AND CHEST PAIN     Action/Plan:   PCP: Romero Belling, MD  LIVES AT HOME WITH SPOUSE; POSSIBLY NEED HHC AT DISCHARGE; CM WILL CONTINUE TO FOLLOW FOR DCP   Anticipated DC Date:  06/08/2012   Anticipated DC Plan:  HOME/SELF CARE      Status of service:  In process, will continue to follow Medicare Important Message given?  NA - LOS <3 / Initial given by admissions (If response is "NO", the following Medicare IM given date fields will be blank)  Per UR Regulation:  Reviewed for med. necessity/level of care/duration of stay  Comments:  06/01/2012- B Laci Frenkel RN,BSN,MHA

## 2012-06-01 NOTE — Progress Notes (Signed)
Inpatient Diabetes Program Recommendations  AACE/ADA: New Consensus Statement on Inpatient Glycemic Control (2013)  Target Ranges:  Prepandial:   less than 140 mg/dL      Peak postprandial:   less than 180 mg/dL (1-2 hours)      Critically ill patients:  140 - 180 mg/dL   Reason for Visit: Hyperglycemia  Type 2 diabetes mellitus, uncontrolled, with renal complications  Hemoglobin A1c on 05/30/2012 was 10.7, suggesting an average blood sugar of 260. Restart home NPH (70/30) at half home dose tomorrow. Sliding scale insulin. Continue diabetic diet. Has had few episodes of hypoglycemia at the time of admission.    Results for VERLE, BRILLHART (MRN [id number]) as of 06/01/2012 15:50  Ref. Range 05/31/2012 08:15 05/31/2012 11:39 05/31/2012 17:33 05/31/2012 21:54 06/01/2012 07:27 06/01/2012 11:56  Glucose-Capillary Latest Range: 70-99 mg/dL 119 (H) 147 (H) 829 (H) 244 (H) 230 (H) 267 (H)   Results for TIMA, CURET (MRN [id number]) as of 06/01/2012 15:50  Ref. Range 05/30/2012 14:23  Hemoglobin A1C Latest Range: <5.7 % 10.7 (H)    Inpatient Diabetes Program Recommendations Insulin - Basal: 70/30 insulin 60 units bid Insulin - Meal Coverage: D/C meal coverage insulin since pt is on 70/30 insulin   Eating approx 50 - 75% meals. Will continue to follow.

## 2012-06-01 NOTE — Progress Notes (Signed)
Talked to patient with spouse present about DCP; patient plans to return home at discharge and request [facility] for [facility] needs; Attending MD please order HHPT at discharge; B Shelba Flake

## 2012-06-01 NOTE — Evaluation (Signed)
Physical Therapy Evaluation Patient Details Name: [name] MRN: [id number] DOB: 1929-10-28 Today's Date: 06/01/2012 Time: 6213-0865 PT Time Calculation (min): 23 min  PT Assessment / Plan / Recommendation Clinical Impression  Pt presents with chest pain at rest and diarrhea with history of CHF, DM, CAD and COPD.  Tolerated ambulation in hallway with RW, however fatigues quickly and requires min assist thoughout for safety.  Pt will benefit from skilled PT in acute venue to address deficits.  PT recommends [facility] for follow up at D/C, however pt/husband refuse at this time, therefore recommend HHPT with 24/7 supervision/asist.     PT Assessment  Patient needs continued PT services    Follow Up Recommendations  Home health PT;SNF;Supervision/Assistance - 24 hour    Does the patient have the potential to tolerate intense rehabilitation      Barriers to Discharge None      Equipment Recommendations  None recommended by PT    Recommendations for Other Services OT consult   Frequency Min 3X/week    Precautions / Restrictions Precautions Precautions: Fall Restrictions Weight Bearing Restrictions: No   Pertinent Vitals/Pain No pain stated.       Mobility  Bed Mobility Bed Mobility: Sit to Supine Sit to Supine: 3: Mod assist Details for Bed Mobility Assistance: Requires assist for BLEs into bed with cues for adjusting hips once in bed.  Transfers Transfers: Sit to Stand;Stand to Sit Sit to Stand: 4: Min assist;With upper extremity assist;With armrests;From chair/3-in-1 Stand to Sit: 4: Min assist;With upper extremity assist;To bed Details for Transfer Assistance: Assist to rise, stabalize and ensure controlled descent with cues for hand placement and safety when sitting/standing.  Ambulation/Gait Ambulation/Gait Assistance: 4: Min assist Ambulation Distance (Feet): 58 Feet Assistive device: Rolling walker Ambulation/Gait Assistance Details: Assist to steady  throughout with cues for sequencing/technique and positioning inside of RW.  Also cues for upright posture throughout.  Gait Pattern: Step-through pattern;Decreased stride length;Trunk flexed Gait velocity: decreased Stairs: No Wheelchair Mobility Wheelchair Mobility: No    Shoulder Instructions     Exercises     PT Diagnosis: Difficulty walking;Generalized weakness  PT Problem List: Decreased strength;Decreased activity tolerance;Decreased balance;Decreased mobility;Decreased knowledge of use of DME;Decreased safety awareness;Decreased knowledge of precautions PT Treatment Interventions: DME instruction;Gait training;Stair training;Functional mobility training;Therapeutic activities;Therapeutic exercise;Balance training;Patient/family education   PT Goals Acute Rehab PT Goals PT Goal Formulation: With patient/family Time For Goal Achievement: 06/15/12 Potential to Achieve Goals: Good Pt will go Supine/Side to Sit: with supervision PT Goal: Supine/Side to Sit - Progress: Goal set today Pt will go Sit to Supine/Side: with supervision PT Goal: Sit to Supine/Side - Progress: Goal set today Pt will go Sit to Stand: with supervision PT Goal: Sit to Stand - Progress: Goal set today Pt will go Stand to Sit: with supervision PT Goal: Stand to Sit - Progress: Goal set today Pt will Ambulate: 51 - 150 feet;with supervision;with least restrictive assistive device PT Goal: Ambulate - Progress: Goal set today Pt will Go Up / Down Stairs: 1-2 stairs;with min assist;with least restrictive assistive device PT Goal: Up/Down Stairs - Progress: Goal set today  Visit Information  Last PT Received On: 06/01/12 Assistance Needed: +1    Subjective Data  Subjective: I just feel tired.  Patient Stated Goal: to return home with husband.    Prior Functioning  Home Living Lives With: Spouse Available Help at Discharge: Family;Available PRN/intermittently Type of Home: House Home Access: Stairs to  enter [employer] of Steps: 2  Entrance Stairs-Rails: None Home Layout: One level Bathroom Shower/Tub: Walk-in shower;Tub/shower unit Bathroom Toilet: Handicapped height Home Adaptive Equipment: Grab bars in shower;Walker - rolling;Straight cane Prior Function Level of Independence: Needs assistance Needs Assistance: Bathing Able to Take Stairs?: Yes Driving: No Vocation: Retired    IT consultant  Overall Cognitive Status: Appears within functional limits for tasks assessed/performed Arousal/Alertness: Awake/alert Orientation Level: Appears intact for tasks assessed Behavior During Session: [facility] for tasks performed    Extremity/Trunk Assessment Right Lower Extremity Assessment RLE ROM/Strength/Tone: Deficits RLE ROM/Strength/Tone Deficits: Pt with generalized weakness, grossly 3/5 per functional assessment Left Lower Extremity Assessment LLE ROM/Strength/Tone: Deficits LLE ROM/Strength/Tone Deficits: Pt with generalized weakness, grossly 3/5 per functional assessment.  Trunk Assessment Trunk Assessment: Kyphotic   Balance    End of Session PT - End of Session Equipment Utilized During Treatment: Gait belt Activity Tolerance: Patient limited by fatigue Patient left: in bed;with call bell/phone within reach;with family/visitor present Nurse Communication: Mobility status  GP     Page, Meribeth Mattes 06/01/2012, 2:01 PM

## 2012-06-01 NOTE — Progress Notes (Signed)
[facility] PROGRESS NOTE  DRISHTI PEPPERMAN ZOX:[id number] DOB: [birth year] DOA: 05/30/2012 PCP: Romero Belling, MD  Assessment/Plan: Chest pain at rest Troponin negative, patient ruled out for acute coronary syndrome.  Appreciate cardiology input.  2-D echocardiogram done with results as indicated below.  Cardiology recommended myocardial perfusion scan today but nuclear medicine could not do the test today, will have to be rescheduled for tomorrow. Continue her beta blocker, isosorbide dinitrate, and aspirin.  Type 2 diabetes mellitus, uncontrolled, with renal complications  Hemoglobin A1c on 05/30/2012 was 10.7, suggesting an average blood sugar of 260.  Change to NovoLog 70/30 at 60 units as patient did not get a dose of home NPH (70/30) 60 units yesterday (home dose 120 units).  Sliding scale insulin.  Continue diabetic diet.  Hypoglycemic episodes initially on presentation resolved.  Hypokalemia  Resolved with replacement.  Continue replacement, likely due to diarrhea.  Hold furosemide .  Hypertension  Stable, continue labetalol.  Continue to hold diuretics.  Continued hydralazine and isosorbide dinitrate.  Saline lock IVF.  Acute renal failure on chronic kidney disease stage III Improved with mild IV hydration.  Continue to hold furosemide and metolazone.  Resume home diuretic doses at discharge, patient to followup with her cardiologist Dr. Daleen Squibb to have labs checked in 1 week  Abdominal pain/Diarrhea Currently denies any abdominal pain. Etiology unclear, no further episodes of diarrhea since admission.  CT of the abdomen and pelvis with contrast on 05/30/2012 showed no acute abdominal findings, no change from prior imaging, status post cholecystectomy.  Stool culture on 05/30/2012 negative.  C. difficile has not been sent, pending, has had no further episodes of diarrhea.  Continue to monitor.  Suspect may be due to viral gastroenteritis.  Hyperlipidemia Continue statin.   Restart ezetimibe at discharge.   Chronic systolic and diastolic congestive heart failure Patient appears compensated.  2-D echocardiogram on 05/31/2012 short systolic function was mildly to moderately reduced, EF 40-45%, diffuse hypokinesis.  Patient not on ACE/ARB DUE to chronic kidney disease.  Generalized weakness Physical therapy recommending home physical therapy.  Which will be arranged at the time of discharge.  Sinus congestion Etiology unclear.  Do not suspect upper respiratory viral infection at this time.  When necessary Afrin doses to help with secretions.  Code Status: Full code Family Communication: No family at the bedside.  Disposition Plan: Pending, cardiology workup.  Consider discharge tomorrow.  Consultants:  Cardiology, Dr. Jens Som  Procedures: 2-D echocardiogram on 05/31/2012 Study Conclusions - Left ventricle: The cavity size was normal. Wall thickness was increased in a pattern of severe LVH. Systolic function was mildly to moderately reduced. The estimated ejection fraction was in the range of 40% to 45%. Diffuse hypokinesis. Doppler parameters are consistent with abnormal left ventricular relaxation (grade 1 diastolic dysfunction). Doppler parameters are consistent with elevated ventricular end-diastolic filling pressure. - Aortic valve: Mildly to moderately calcified annulus. Trileaflet; mildly calcified leaflets. Moderate calcification involving the noncoronary cusp. Noncoronary cusp mobility was severely restricted. There was mild to moderate stenosis. Although velocity was not significantly increased, planimetry indicates valve area approximately 1.3 cm2. No significant regurgitation. - Mitral valve: Calcified annulus. Mildly thickened leaflets. Mobility was restricted. Mild regurgitation. - Left atrium: The atrium was moderately dilated. - Tricuspid valve: Mildly thickened leaflets. Trivial regurgitation. - Pericardium, extracardiac: There was no  pericardial effusion.  Antibiotics:  None   HPI/Subjective: Denies any chest pain or shortness of breath.  Denies any diarrhea.  Complaining of sinus congestion.  Objective: Filed Vitals:  05/31/12 0500 05/31/12 1430 05/31/12 2046 06/01/12 0626  BP:  127/60 117/66 124/56  Pulse:  72 74 85  Temp:  97.7 F (36.5 C) 97.5 F (36.4 C) 97 F (36.1 C)  TempSrc:  Oral Oral Oral  Resp:  18 18 20   Height:      Weight: 98 kg (216 lb 0.8 oz)   97.1 kg (214 lb 1.1 oz)  SpO2:  99% 100% 95%    Intake/Output Summary (Last 24 hours) at 06/01/12 0821 Last data filed at 06/01/12 0313  Gross per 24 hour  Intake 1917.08 ml  Output    300 ml  Net 1617.08 ml   Filed Weights   05/30/12 2236 05/31/12 0500 06/01/12 0626  Weight: 97.8 kg (215 lb 9.8 oz) 98 kg (216 lb 0.8 oz) 97.1 kg (214 lb 1.1 oz)    Exam: Physical Exam: General: Awake, Oriented, No acute distress. HEENT: EOMI. Neck: Supple CV: S1 and S2 Lungs: Clear to ascultation bilaterally Abdomen: Soft, Nontender, Nondistended, +bowel sounds. Ext: Good pulses. Trace edema.  Data Reviewed: Basic Metabolic Panel:  Lab 06/01/12 0981 05/31/12 0335 05/30/12 2025 05/30/12 1432  NA 135 133* -- 139  K 4.8 3.0* -- 3.3*  CL 99 94* -- 97  CO2 23 27 -- 31  GLUCOSE 234* 162* -- 66*  BUN 31* 27* -- 23  CREATININE 1.53* 1.67* -- 1.56*  CALCIUM 8.7 8.3* -- 9.0  MG 1.6 -- 1.6 --  PHOS -- -- -- --   Liver Function Tests:  Lab 06/01/12 0514 05/31/12 0335 05/30/12 1432  AST 20 20 25   ALT 16 16 19   ALKPHOS 78 70 93  BILITOT 0.8 0.9 1.4*  PROT 6.1 5.5* 6.9  ALBUMIN 3.0* 2.7* 3.2*    Lab 05/30/12 1432  LIPASE 18  AMYLASE --   No results found for this basename: AMMONIA:5 in the last 168 hours CBC:  Lab 06/01/12 0514 05/30/12 1432  WBC 8.2 9.5  NEUTROABS -- 8.0*  HGB 13.8 15.6*  HCT 42.8 47.3*  MCV 90.1 88.7  PLT 223 274   Cardiac Enzymes:  Lab 05/31/12 0922 05/31/12 0335 05/30/12 2025 05/30/12 1432  CKTOTAL -- -- 425*  --  CKMB -- -- 6.5* --  CKMBINDEX -- -- -- --  TROPONINI <0.30 <0.30 <0.30 <0.30   BNP (last 3 results)  Basename 05/30/12 1432  PROBNP 3213.0*   CBG:  Lab 05/31/12 2154 05/31/12 1733 05/31/12 1139 05/31/12 0815 05/30/12 2219  GLUCAP 244* 160* 255* 197* 161*    No results found for this or any previous visit (from the past 240 hour(s)).   Studies: Ct Abdomen Pelvis Wo Contrast  05/30/2012  *RADIOLOGY REPORT*  Clinical Data: Abdominal pain, IV contrast allergy  CT ABDOMEN AND PELVIS WITHOUT CONTRAST  Technique:  Multidetector CT imaging of the abdomen and pelvis was performed following the standard protocol without intravenous contrast.  Comparison: CT abdomen 09/19/2008  Findings: Lung bases are clear.  No pericardial fluid.  Valvular calcification noted.  No focal hepatic lesion on noncontrast exam.  Gallbladder clips within the gallbladder fossa.  Pancreas, spleen, adrenal glands, kidneys are normal.  The stomach is normal.  Hiatal hernia is present.  The small bowel and colon are normal.  The cecum is midline.  Appendix is not identified.  Contrast flows in entirety of the bowel to the rectum.  Abdominal aorta normal caliber.  No retroperitoneal periportal lymphadenopathy.  Uterus and ovaries are normal.  The bladder is normal.  No pelvic  lymphadenopathy. Review of  bone windows demonstrates no aggressive osseous lesions.  IMPRESSION:  1.  No acute abdominal or pelvic findings.  No change from prior. 2.  Cholecystectomy   Original Report Authenticated By: Genevive Bi, M.D.    Dg Chest 2 View  05/30/2012  *RADIOLOGY REPORT*  Clinical Data: Cough.COPD.  Congestive heart failure.  Coronary artery disease.  CHEST - 2 VIEW  Comparison: 06/27/2011  Findings: Mild cardiomegaly stable.  Both lungs are clear.  No evidence of pleural effusion.  No mass or lymphadenopathy identified.  IMPRESSION: Stable cardiomegaly.  No active lung disease.   Original Report Authenticated By: Myles Rosenthal, M.D.      Scheduled Meds:    . antiseptic oral rinse  15 mL Mouth Rinse BID  . aspirin EC  325 mg Oral Daily  . atorvastatin  80 mg Oral q1800  . guaiFENesin  600 mg Oral BID  . heparin  5,000 Units Subcutaneous Q8H  . hydrALAZINE  25 mg Oral TID  . insulin aspart  0-5 Units Subcutaneous QHS  . insulin aspart  0-9 Units Subcutaneous TID WC  . insulin aspart  3 Units Subcutaneous TID WC  . insulin NPH-insulin regular  60 Units Subcutaneous Q breakfast  . isosorbide dinitrate  30 mg Oral BID  . labetalol  100 mg Oral BID  . latanoprost  1 drop Both Eyes QHS  . sodium chloride  3 mL Intravenous Q12H  . Vitamin D (Ergocalciferol)  50,000 Units Oral 2 times weekly   Continuous Infusions:   Principal Problem:  *Chest pain at rest Active Problems:  DM (diabetes mellitus), type 2, uncontrolled, with renal complications  HYPERLIPIDEMIA  HYPOKALEMIA  HYPERTENSION  Chronic combined systolic and diastolic heart failure  RENAL INSUFFICIENCY  Abdominal pain  Diarrhea  Elevated bilirubin    Time spent: 20 minutes.    [facility]  [facility] Pager [phone]. If 7PM-7AM, please contact night-coverage at [email or web address], password [facility] 06/01/2012, 8:21 AM  LOS: 2 days

## 2012-06-01 NOTE — Progress Notes (Signed)
Called to [facility] for the ordered stress test, told that procedure will be done tomorrow in am. Md paged and notified. Will continue to monitor.

## 2012-06-02 ENCOUNTER — Ambulatory Visit (HOSPITAL_COMMUNITY)
Admit: 2012-06-02 | Discharge: 2012-06-02 | Disposition: A | Discharge status: Home / Self Care | Payer: Medicare Other | Attending: Cardiology | Admitting: Cardiology

## 2012-06-02 ENCOUNTER — Inpatient Hospital Stay (HOSPITAL_COMMUNITY)
Admit: 2012-06-02 | Discharge: 2012-06-02 | Disposition: A | Discharge status: Home / Self Care | Payer: Medicare Other | Attending: Cardiology | Admitting: Cardiology

## 2012-06-02 DIAGNOSIS — R079 Chest pain, unspecified: Secondary | ICD-10-CM

## 2012-06-02 DIAGNOSIS — J45909 Unspecified asthma, uncomplicated: Secondary | ICD-10-CM

## 2012-06-02 DIAGNOSIS — I5042 Chronic combined systolic (congestive) and diastolic (congestive) heart failure: Secondary | ICD-10-CM

## 2012-06-02 LAB — BASIC METABOLIC PANEL
Calcium: 8.6 mg/dL (ref 8.4–10.5)
GFR calc Af Amer: 40 mL/min — ABNORMAL LOW (ref >90)
GFR calc non Af Amer: 34 mL/min — ABNORMAL LOW (ref >90)
Glucose, Bld: 240 mg/dL — ABNORMAL HIGH (ref 70–99)
Potassium: 4.3 mEq/L (ref 3.5–5.1)
Sodium: 137 mEq/L (ref 135–145)

## 2012-06-02 LAB — CBC
MCH: 29 pg (ref 26.0–34.0)
MCHC: 32.3 g/dL (ref 30.0–36.0)
Platelets: 271 10*3/uL (ref 150–400)
RDW: 13.1 % (ref 11.5–15.5)

## 2012-06-02 LAB — GLUCOSE, CAPILLARY: Glucose-Capillary: 232 mg/dL — ABNORMAL HIGH (ref 70–99)

## 2012-06-02 MED ORDER — TECHNETIUM TC 99M SESTAMIBI GENERIC - CARDIOLITE: 30.0000 | Freq: Once | INTRAVENOUS | Status: AC | PRN

## 2012-06-02 MED ORDER — TECHNETIUM TC 99M SESTAMIBI GENERIC - CARDIOLITE
10.0000 | Freq: Once | INTRAVENOUS | Status: AC | PRN
  Administered 2012-06-02: 10 via INTRAVENOUS

## 2012-06-02 MED ORDER — INSULIN ASPART PROT & ASPART (70-30 MIX) 100 UNIT/ML ~~LOC~~ SUSP
30.0000 [IU] | Freq: Once | SUBCUTANEOUS | Status: AC
  Administered 2012-06-02: 30 [IU] via SUBCUTANEOUS
  Filled 2012-06-02: qty 3

## 2012-06-02 MED ORDER — REGADENOSON 0.4 MG/5ML IV SOLN
0.4000 mg | Freq: Once | INTRAVENOUS | Status: AC
  Administered 2012-06-02: 0.4 mg via INTRAVENOUS

## 2012-06-02 MED ORDER — INSULIN ASPART PROT & ASPART (70-30 MIX) 100 UNIT/ML ~~LOC~~ SUSP
60.0000 [IU] | Freq: Two times a day (BID) | SUBCUTANEOUS | Status: DC
  Administered 2012-06-02: 60 [IU] via SUBCUTANEOUS
  Filled 2012-06-02: qty 3

## 2012-06-02 MED ORDER — TECHNETIUM TC 99M SESTAMIBI GENERIC - CARDIOLITE
30.0000 | Freq: Once | INTRAVENOUS | Status: AC | PRN
  Administered 2012-06-02: 30 via INTRAVENOUS

## 2012-06-02 NOTE — Progress Notes (Signed)
   Subjective:  Denies CP or dyspnea   Objective:  Filed Vitals:   06/01/12 0626 06/01/12 1430 06/01/12 2119 06/02/12 0434  BP: 124/56 159/63 142/63 158/65  Pulse: 85 82 71 73  Temp: 97 F (36.1 C) 98.2 F (36.8 C) 98.7 F (37.1 C) 97.8 F (36.6 C)  TempSrc: Oral Tympanic Oral Oral  Resp: 20 20 20 18   Height:      Weight: 214 lb 1.1 oz (97.1 kg)   212 lb 8 oz (96.389 kg)  SpO2: 95% 97% 98% 99%    Intake/Output from previous day:  Intake/Output Summary (Last 24 hours) at 06/02/12 1610 Last data filed at 06/02/12 0455  Gross per 24 hour  Intake    600 ml  Output    375 ml  Net    225 ml    Physical Exam: Physical exam: Well-developed well-nourished in no acute distress.  Skin is warm and dry.  HEENT is normal.  Neck is supple.  Chest is clear to auscultation with normal expansion.  Cardiovascular exam is regular rate and rhythm.  Abdominal exam nontender or distended. No masses palpated. Extremities show no edema. neuro grossly intact    Lab Results: Basic Metabolic Panel:  Basename 06/01/12 0514 05/31/12 0335 05/30/12 2025  NA 135 133* --  K 4.8 3.0* --  CL 99 94* --  CO2 23 27 --  GLUCOSE 234* 162* --  BUN 31* 27* --  CREATININE 1.53* 1.67* --  CALCIUM 8.7 8.3* --  MG 1.6 -- 1.6  PHOS -- -- --   CBC:  Basename 06/02/12 0521 06/01/12 0514 05/30/12 1432  WBC 7.3 8.2 --  NEUTROABS -- -- 8.0*  HGB 13.1 13.8 --  HCT 40.6 42.8 --  MCV 89.8 90.1 --  PLT 271 223 --   Cardiac Enzymes:  Basename 05/31/12 0922 05/31/12 0335 05/30/12 2025  CKTOTAL -- -- 425*  CKMB -- -- 6.5*  CKMBINDEX -- -- --  TROPONINI <0.30 <0.30 <0.30   Echo shows EF 40-45, mild to moderate AS, mild MR and moderate LAE  Assessment/Plan:  1 chest pain-the patient has ruled out. I have reviewed her electrocardiogram. She has had some of those T-wave changes on previous tracings. Her main complaint at the time she presented to the emergency room was diarrhea. We will plan a  Lexiscan Myoview this morning (schedule would not allow yesterday) for risk stratification, particularly in light of mildly reduced LV function. If normal or low risk, patient can be DCed from a cardiac standpoint and fu Dr Daleen Squibb. She would be high risk for contrast nephropathy if cath ever performed. 2 diarrhea-management per primary care; improved. 3 chronic diastolic congestive heart failure-diarrhea has resolved. Would resume preadmission dose of diuretics and KCL. Repeat BMET one week after DC with results to Dr Daleen Squibb. 4 acute on chronic renal insufficiency-follow renal function closely. 5 hypertension-continue present blood pressure medications. 6 diabetes mellitus-management per primary care. 7 hyperlipidemia 8 hypokalemia-resolved 9 Mild to moderate AS  Olga Millers 06/02/2012, 6:25 AM

## 2012-06-02 NOTE — Progress Notes (Signed)
[facility] ED CM contacted by unit Rn for pt to clarify what needs to be done for patient to complete referral Cm reviewed unit Cm notes Last note requesting orders from attending MD that need to be [facility] (home health choice per unit CM notes)

## 2012-06-02 NOTE — Progress Notes (Signed)
[facility] PROGRESS NOTE  SHAELEY SEGALL ZOX:[id number] DOB: [birth year] DOA: 05/30/2012 PCP: Romero Belling, MD  Assessment/Plan: Chest pain at rest Troponin negative, patient ruled out for acute coronary syndrome.  Appreciate cardiology input.  2-D echocardiogram done with results as indicated below.  Cardiology recommended myocardial perfusion scan today but nuclear medicine to be performed today. Continue her beta blocker, isosorbide dinitrate, and aspirin.  Type 2 diabetes mellitus, uncontrolled, with renal complications  Hemoglobin A1c on 05/30/2012 was 10.7, suggesting an average blood sugar of 260.  Change to NovoLog 70/30 at 60 units as patient did not get a dose of home NPH (70/30) 60 units in 05/31/2012 (home dose 120 units).  Sliding scale insulin.  Continue diabetic diet.  Hypoglycemic episodes initially on presentation resolved.  Hypokalemia  Resolved with replacement. Hold furosemide .  Hypertension  Stable, continue labetalol.  Continue to hold diuretics.  Continued hydralazine and isosorbide dinitrate.  Saline lock IVF.  Acute renal failure on chronic kidney disease stage III Improved with mild IV hydration.  Continue to hold furosemide and metolazone.  Resume home diuretic doses at discharge, patient to followup with her cardiologist Dr. Daleen Squibb to have labs checked in 1 week  Abdominal pain/Diarrhea Currently denies any abdominal pain. Etiology unclear, no further episodes of diarrhea since admission.  CT of the abdomen and pelvis with contrast on 05/30/2012 showed no acute abdominal findings, no change from prior imaging, status post cholecystectomy.  Stool culture on 05/30/2012 negative.  C. difficile has not been sent, pending, has had no further episodes of diarrhea.  Continue to monitor.  Suspect may be due to viral gastroenteritis.  Hyperlipidemia Continue statin.  Restart ezetimibe at discharge.   Chronic systolic and diastolic congestive heart  failure Patient appears compensated.  2-D echocardiogram on 05/31/2012 short systolic function was mildly to moderately reduced, EF 40-45%, diffuse hypokinesis.  Patient not on ACE/ARB DUE to chronic kidney disease.  Generalized weakness Physical therapy recommending home physical therapy.  Which will be arranged at the time of discharge.  Sinus congestion Etiology unclear.  Do not suspect upper respiratory viral infection at this time.  Resolved with PRN Afrin.  Code Status: Full code Family Communication: No family at the bedside.  Disposition Plan: Pending, cardiology workup.  Consider discharge today if stress test is negative/low risk.  Consultants:  Cardiology, Dr. Jens Som  Procedures: 2-D echocardiogram on 05/31/2012 Study Conclusions - Left ventricle: The cavity size was normal. Wall thickness was increased in a pattern of severe LVH. Systolic function was mildly to moderately reduced. The estimated ejection fraction was in the range of 40% to 45%. Diffuse hypokinesis. Doppler parameters are consistent with abnormal left ventricular relaxation (grade 1 diastolic dysfunction). Doppler parameters are consistent with elevated ventricular end-diastolic filling pressure. - Aortic valve: Mildly to moderately calcified annulus. Trileaflet; mildly calcified leaflets. Moderate calcification involving the noncoronary cusp. Noncoronary cusp mobility was severely restricted. There was mild to moderate stenosis. Although velocity was not significantly increased, planimetry indicates valve area approximately 1.3 cm2. No significant regurgitation. - Mitral valve: Calcified annulus. Mildly thickened leaflets. Mobility was restricted. Mild regurgitation. - Left atrium: The atrium was moderately dilated. - Tricuspid valve: Mildly thickened leaflets. Trivial regurgitation. - Pericardium, extracardiac: There was no pericardial effusion.  Antibiotics:  None   HPI/Subjective: Denies any chest pain  or diarrhea.  No further episodes..  Objective: Filed Vitals:   06/01/12 0626 06/01/12 1430 06/01/12 2119 06/02/12 0434  BP: 124/56 159/63 142/63 158/65  Pulse: 85 82 71 73  Temp: 97 F (36.1 C) 98.2 F (36.8 C) 98.7 F (37.1 C) 97.8 F (36.6 C)  TempSrc: Oral Tympanic Oral Oral  Resp: 20 20 20 18   Height:      Weight: 97.1 kg (214 lb 1.1 oz)   96.389 kg (212 lb 8 oz)  SpO2: 95% 97% 98% 99%    Intake/Output Summary (Last 24 hours) at 06/02/12 0818 Last data filed at 06/02/12 1610  Gross per 24 hour  Intake    600 ml  Output    375 ml  Net    225 ml   Filed Weights   05/31/12 0500 06/01/12 0626 06/02/12 0434  Weight: 98 kg (216 lb 0.8 oz) 97.1 kg (214 lb 1.1 oz) 96.389 kg (212 lb 8 oz)    Exam: Physical Exam: General: Awake, Oriented, No acute distress. HEENT: EOMI. Neck: Supple CV: S1 and S2 Lungs: Clear to ascultation bilaterally Abdomen: Soft, Nontender, Nondistended, +bowel sounds. Ext: Good pulses. Trace edema.  Data Reviewed: Basic Metabolic Panel:  Lab 06/02/12 9604 06/01/12 0514 05/31/12 0335 05/30/12 2025 05/30/12 1432  NA 137 135 133* -- 139  K 4.3 4.8 3.0* -- 3.3*  CL 102 99 94* -- 97  CO2 24 23 27  -- 31  GLUCOSE 240* 234* 162* -- 66*  BUN 29* 31* 27* -- 23  CREATININE 1.40* 1.53* 1.67* -- 1.56*  CALCIUM 8.6 8.7 8.3* -- 9.0  MG -- 1.6 -- 1.6 --  PHOS -- -- -- -- --   Liver Function Tests:  Lab 06/01/12 0514 05/31/12 0335 05/30/12 1432  AST 20 20 25   ALT 16 16 19   ALKPHOS 78 70 93  BILITOT 0.8 0.9 1.4*  PROT 6.1 5.5* 6.9  ALBUMIN 3.0* 2.7* 3.2*    Lab 05/30/12 1432  LIPASE 18  AMYLASE --   No results found for this basename: AMMONIA:5 in the last 168 hours CBC:  Lab 06/02/12 0521 06/01/12 0514 05/30/12 1432  WBC 7.3 8.2 9.5  NEUTROABS -- -- 8.0*  HGB 13.1 13.8 15.6*  HCT 40.6 42.8 47.3*  MCV 89.8 90.1 88.7  PLT 271 223 274   Cardiac Enzymes:  Lab 05/31/12 0922 05/31/12 0335 05/30/12 2025 05/30/12 1432  CKTOTAL -- -- 425* --   CKMB -- -- 6.5* --  CKMBINDEX -- -- -- --  TROPONINI <0.30 <0.30 <0.30 <0.30   BNP (last 3 results)  Basename 05/30/12 1432  PROBNP 3213.0*   CBG:  Lab 06/02/12 0749 06/01/12 2118 06/01/12 1622 06/01/12 1156 06/01/12 0727  GLUCAP 211* 198* 210* 267* 230*    Recent Results (from the past 240 hour(s))  STOOL CULTURE     Status: Normal (Preliminary result)   Collection Time   05/30/12  7:25 PM      Component Value Range Status Comment   Specimen Description STOOL   Final    Special Requests NONE   Final    Culture NO SUSPICIOUS COLONIES, CONTINUING TO HOLD   Final    Report Status PENDING   Incomplete      Studies: No results found.  Scheduled Meds:    . antiseptic oral rinse  15 mL Mouth Rinse BID  . aspirin EC  325 mg Oral Daily  . atorvastatin  80 mg Oral q1800  . guaiFENesin  600 mg Oral BID  . heparin  5,000 Units Subcutaneous Q8H  . hydrALAZINE  25 mg Oral TID  . insulin aspart  0-5 Units Subcutaneous QHS  . insulin aspart  0-9 Units  Subcutaneous TID WC  . insulin aspart protamine-insulin aspart  60 Units Subcutaneous Q supper  . insulin aspart protamine-insulin aspart  60 Units Subcutaneous BID WC  . isosorbide dinitrate  30 mg Oral BID  . labetalol  100 mg Oral BID  . latanoprost  1 drop Both Eyes QHS  . oxymetazoline  1 spray Each Nare BID  . sodium chloride  3 mL Intravenous Q12H  . Vitamin D (Ergocalciferol)  50,000 Units Oral 2 times weekly   Continuous Infusions:   Principal Problem:  *Chest pain at rest Active Problems:  DM (diabetes mellitus), type 2, uncontrolled, with renal complications  HYPERLIPIDEMIA  HYPOKALEMIA  HYPERTENSION  Chronic combined systolic and diastolic heart failure  RENAL INSUFFICIENCY  Abdominal pain  Diarrhea  Elevated bilirubin    Time spent: 20 minutes.    [facility]  [facility] Pager [phone]. If 7PM-7AM, please contact night-coverage at [email or web address], password [facility] 06/02/2012, 8:18 AM  LOS: 3  days

## 2012-06-02 NOTE — Discharge Summary (Addendum)
Physician Discharge Summary  [name]:[id number] DOB: [birth year] DOA: 05/30/2012  PCP: Romero Belling, MD  Admit date: 05/30/2012 Discharge date: 06/02/2012  Time spent: 40 minutes  Recommendations for Outpatient Follow-up:  Please follow up with your primary care physician Everardo All, Gregary Signs, MD) in 1 week.  Please followup with your cardiologist Dr. Daleen Squibb and please have CBC and BMET checked at the next clinic visit in 1 week.  Discharge Diagnoses:  Principal Problem:  *Chest pain at rest Active Problems:  DM (diabetes mellitus), type 2, uncontrolled, with renal complications  HYPERLIPIDEMIA  HYPOKALEMIA  HYPERTENSION  Chronic combined systolic and diastolic heart failure  RENAL INSUFFICIENCY  Abdominal pain  Diarrhea  Elevated bilirubin  Discharge Condition: Stable  Diet recommendation: Diabetic diet  Filed Weights   05/31/12 0500 06/01/12 0626 06/02/12 0434  Weight: 98 kg (216 lb 0.8 oz) 97.1 kg (214 lb 1.1 oz) 96.389 kg (212 lb 8 oz)    History of present illness:  On admission: "[name] is a 78 y.o. female with past medical history of chronic diastolic heart failure with a preserved EF, diabetes mellitus last hemoglobin A1c of 10.9, nonobstructive coronary artery disease status post cath in January 2010 comes in for chest pain and diarrhea 2 days prior to admission on 05/30/2012."   Hospital Course:  Chest pain at rest  Troponin negative, patient ruled out for acute coronary syndrome.  Cardiology evaluated the patient. 2-D echocardiogram done with results as indicated below. Cardiology recommended myocardial perfusion scan on 06/02/2012 which showed low anterior wall scar, no findings for myocardial ischemia. Continue her beta blocker, isosorbide dinitrate, and aspirin.   Type 2 diabetes mellitus, uncontrolled, with renal complications  Hemoglobin A1c on 05/30/2012 was 10.7, suggesting an average blood sugar of 260.  During the hospital stay  was on NovoLog 70/30 at 60 units, at discharge was resumed on home diabetic regimen. Hypoglycemic episodes initially on presentation resolved during the hospital stay.  Hypokalemia  Resolved with replacement. Hold furosemide .   Hypertension  Stable, continue labetalol.  Resume diuretics on discharge.  Continued hydralazine and isosorbide dinitrate. Saline lock IVF.   Acute renal failure on chronic kidney disease stage III  Improved with mild IV hydration.  Initially held furosemide and metolazone, resume home diuretic doses at discharge, patient to followup with her cardiologist Dr. Daleen Squibb to have labs checked in 1 week as per Dr. Jens Som cardiology.  Abdominal pain/Diarrhea  Abdominal pain resolved during the hospital stay. Etiology unclear, no further episodes of diarrhea since admission. CT of the abdomen and pelvis with contrast on 05/30/2012 showed no acute abdominal findings, no change from prior imaging, status post cholecystectomy. Stool culture on 05/30/2012 negative.  During the hospital stay patient did not have any diarrhea, prior to discharge had a bowel movement, C. difficile PCR sent on 06/02/2012 which was negative. Suspect diarrhea and abdominal pain likely due to viral gastroenteritis.   Hyperlipidemia  Continue statin. Restart ezetimibe at discharge.   Chronic systolic and diastolic congestive heart failure  Patient appears compensated. 2-D echocardiogram on 05/31/2012 short systolic function was mildly to moderately reduced, EF 40-45%, diffuse hypokinesis. Patient not on ACE/ARB DUE to chronic kidney disease.   Generalized weakness  Physical therapy recommending home physical therapy.  Arranged for home health PT and RN at discharge.   Sinus congestion  Etiology unclear. Do not suspect upper respiratory viral infection at this time. Resolved with PRN Afrin.   Consultants:  Cardiology, Dr. Jens Som  Procedures:  2-D  echocardiogram on 05/31/2012  Study Conclusions -  Left ventricle: The cavity size was normal. Wall thickness was increased in a pattern of severe LVH. Systolic function was mildly to moderately reduced. The estimated ejection fraction was in the range of 40% to 45%. Diffuse hypokinesis. Doppler parameters are consistent with abnormal left ventricular relaxation (grade 1 diastolic dysfunction). Doppler parameters are consistent with elevated ventricular end-diastolic filling pressure. - Aortic valve: Mildly to moderately calcified annulus. Trileaflet; mildly calcified leaflets. Moderate calcification involving the noncoronary cusp. Noncoronary cusp mobility was severely restricted. There was mild to moderate stenosis. Although velocity was not significantly increased, planimetry indicates valve area approximately 1.3 cm2. No significant regurgitation. - Mitral valve: Calcified annulus. Mildly thickened leaflets. Mobility was restricted. Mild regurgitation. - Left atrium: The atrium was moderately dilated. - Tricuspid valve: Mildly thickened leaflets. Trivial regurgitation. - Pericardium, extracardiac: There was no pericardial effusion.  Antibiotics:  None   Discharge Exam: Filed Vitals:   06/01/12 1430 06/01/12 2119 06/02/12 0434 06/02/12 1523  BP: 159/63 142/63 158/65 196/52  Pulse: 82 71 73   Temp: 98.2 F (36.8 C) 98.7 F (37.1 C) 97.8 F (36.6 C)   TempSrc: Tympanic Oral Oral   Resp: 20 20 18    Height:      Weight:   96.389 kg (212 lb 8 oz)   SpO2: 97% 98% 99%    Discharge Instructions  Discharge Orders    Future Appointments: Provider: Department: Dept Phone: Center:   06/05/2012 12:00 PM [facility] [phone] None   08/25/2012 10:30 AM Gaylord Shih, MD [facility] Main Office [place]) [phone] [id number]     Future Orders Please Complete By Expires   Diet Carb Modified      Increase activity slowly      Discharge instructions      Comments:   Please follow up with your primary care physician  Everardo All, Gregary Signs, MD) in 1 week. Please have your primary care physician followup on stool studies (c diff PCR) which is pending.  Please followup with your cardiologist Dr. Daleen Squibb and please have CBC and BMET checked at the next clinic visit.       Medication List     As of 06/02/2012  4:56 PM    TAKE these medications         acetaminophen 500 MG tablet   Commonly known as: TYLENOL   Take 1,000 mg by mouth every 6 (six) hours as needed. For pain      aspirin EC 325 MG tablet   Take 325 mg by mouth daily.      ezetimibe 10 MG tablet   Commonly known as: ZETIA   Take 10 mg by mouth daily.      furosemide 80 MG tablet   Commonly known as: LASIX   Take 160 mg by mouth 2 (two) times daily.      hydrALAZINE 25 MG tablet   Commonly known as: APRESOLINE   Take 25 mg by mouth 3 (three) times daily.      insulin NPH-insulin regular (70-30) 100 UNIT/ML injection   Commonly known as: NOVOLIN 70/30   Inject 120 Units into the skin daily with breakfast.      isosorbide dinitrate 30 MG tablet   Commonly known as: ISORDIL   Take 30 mg by mouth 2 (two) times daily.      labetalol 200 MG tablet   Commonly known as: NORMODYNE   Take 100 mg by mouth 2 (two) times daily.  latanoprost 0.005 % ophthalmic solution   Commonly known as: XALATAN   Place 1 drop into both eyes at bedtime.      methylcellulose 1 % ophthalmic solution   Commonly known as: ARTIFICIAL TEARS   Place 1 drop into both eyes daily. For dry eyes      metolazone 5 MG tablet   Commonly known as: ZAROXOLYN   Take 1 tablet (5 mg total) by mouth daily as needed. Daily as needed for weight gain over 221 pounds      nitroGLYCERIN 0.4 MG SL tablet   Commonly known as: NITROSTAT   Place 0.4 mg under the tongue every 5 (five) minutes as needed. For chest pain/MAX 3 doses      potassium chloride SA 20 MEQ tablet   Commonly known as: K-DUR,KLOR-CON   Take 1 tablet (20 mEq total) by mouth 3 (three) times daily.       rosuvastatin 40 MG tablet   Commonly known as: CRESTOR   Take 40 mg by mouth every morning.      Vitamin D (Ergocalciferol) 50000 UNITS Caps   Commonly known as: DRISDOL   Take 50,000 Units by mouth 2 (two) times a week. Monday and Thursday           Follow-up Information    Follow up with Romero Belling, MD. Schedule an appointment as soon as possible for a visit in 1 week.   Contact information:   [address] [place] [address] [phone]       Follow up with Valera Castle, MD. Schedule an appointment as soon as possible for a visit in 1 week. (Please have CBC and BMET in 1 week at Dr. Vern Claude office.)    Contact information:   [address] [place] [address] [phone]           The results of significant diagnostics from this hospitalization (including imaging, microbiology, ancillary and laboratory) are listed below for reference.    Significant Diagnostic Studies: Ct Abdomen Pelvis Wo Contrast  05/30/2012  *RADIOLOGY REPORT*  Clinical Data: Abdominal pain, IV contrast allergy  CT ABDOMEN AND PELVIS WITHOUT CONTRAST  Technique:  Multidetector CT imaging of the abdomen and pelvis was performed following the standard protocol without intravenous contrast.  Comparison: CT abdomen 09/19/2008  Findings: Lung bases are clear.  No pericardial fluid.  Valvular calcification noted.  No focal hepatic lesion on noncontrast exam.  Gallbladder clips within the gallbladder fossa.  Pancreas, spleen, adrenal glands, kidneys are normal.  The stomach is normal.  Hiatal hernia is present.  The small bowel and colon are normal.  The cecum is midline.  Appendix is not identified.  Contrast flows in entirety of the bowel to the rectum.  Abdominal aorta normal caliber.  No retroperitoneal periportal lymphadenopathy.  Uterus and ovaries are normal.  The bladder is normal.  No pelvic lymphadenopathy. Review of  bone windows demonstrates no  aggressive osseous lesions.  IMPRESSION:  1.  No acute abdominal or pelvic findings.  No change from prior. 2.  Cholecystectomy   Original Report Authenticated By: Genevive Bi, M.D.    Dg Chest 2 View  05/30/2012  *RADIOLOGY REPORT*  Clinical Data: Cough.COPD.  Congestive heart failure.  Coronary artery disease.  CHEST - 2 VIEW  Comparison: 06/27/2011  Findings: Mild cardiomegaly stable.  Both lungs are clear.  No evidence of pleural effusion.  No mass or lymphadenopathy identified.  IMPRESSION: Stable  cardiomegaly.  No active lung disease.   Original Report Authenticated By: Myles Rosenthal, M.D.    Nm Myocar Multi W/spect W/wall Motion / Ef  06/02/2012  *RADIOLOGY REPORT*  Clinical Data:  Chest pain.  MYOCARDIAL IMAGING WITH SPECT (REST AND PHARMACOLOGIC-STRESS) GATED LEFT VENTRICULAR WALL MOTION STUDY LEFT VENTRICULAR EJECTION FRACTION  Technique:  Standard myocardial SPECT imaging was performed after resting intravenous injection of 10 mCi Tc-16m sestamibi. Subsequently, intravenous infusion of Lexiscan was performed under the supervision of the Cardiology staff.  At peak effect of the drug, 30 mCi Tc-63m sestamibi was injected intravenously and standard myocardial SPECT  imaging was performed.  Quantitative gated imaging was also performed to evaluate left ventricular wall motion, and estimate left ventricular ejection fraction.  Comparison:  None  Findings: The SPECT rest and stress images demonstrate low anterior wall scar with associated wall motion abnormality.  No reversible defects to suggest ischemia.  There is global hypokinesis with estimated ejection fraction of 37%.  IMPRESSION:  1.  Low anterior wall scar. 2.  No findings for myocardial ischemia. 3.  Global hypokinesis with estimated ejection fraction of 37%.   Original Report Authenticated By: Rudie Meyer, M.D.     Microbiology: Recent Results (from the past 240 hour(s))  STOOL CULTURE     Status: Normal (Preliminary result)    Collection Time   05/30/12  7:25 PM      Component Value Range Status Comment   Specimen Description STOOL   Final    Special Requests NONE   Final    Culture NO SUSPICIOUS COLONIES, CONTINUING TO HOLD   Final    Report Status PENDING   Incomplete      Labs: Basic Metabolic Panel:  Lab 06/02/12 2130 06/01/12 0514 05/31/12 0335 05/30/12 2025 05/30/12 1432  NA 137 135 133* -- 139  K 4.3 4.8 3.0* -- 3.3*  CL 102 99 94* -- 97  CO2 24 23 27  -- 31  GLUCOSE 240* 234* 162* -- 66*  BUN 29* 31* 27* -- 23  CREATININE 1.40* 1.53* 1.67* -- 1.56*  CALCIUM 8.6 8.7 8.3* -- 9.0  MG -- 1.6 -- 1.6 --  PHOS -- -- -- -- --   Liver Function Tests:  Lab 06/01/12 0514 05/31/12 0335 05/30/12 1432  AST 20 20 25   ALT 16 16 19   ALKPHOS 78 70 93  BILITOT 0.8 0.9 1.4*  PROT 6.1 5.5* 6.9  ALBUMIN 3.0* 2.7* 3.2*    Lab 05/30/12 1432  LIPASE 18  AMYLASE --   No results found for this basename: AMMONIA:5 in the last 168 hours CBC:  Lab 06/02/12 0521 06/01/12 0514 05/30/12 1432  WBC 7.3 8.2 9.5  NEUTROABS -- -- 8.0*  HGB 13.1 13.8 15.6*  HCT 40.6 42.8 47.3*  MCV 89.8 90.1 88.7  PLT 271 223 274   Cardiac Enzymes:  Lab 05/31/12 0922 05/31/12 0335 05/30/12 2025 05/30/12 1432  CKTOTAL -- -- 425* --  CKMB -- -- 6.5* --  CKMBINDEX -- -- -- --  TROPONINI <0.30 <0.30 <0.30 <0.30   BNP: BNP (last 3 results)  Basename 05/30/12 1432  PROBNP 3213.0*   CBG:  Lab 06/02/12 1506 06/02/12 0749 06/01/12 2118 06/01/12 1622 06/01/12 1156  GLUCAP 232* 211* 198* 210* 267*    Signed:  Fitzgerald Dunne A  [facility] 06/02/2012, 4:56 PM

## 2012-06-02 NOTE — Progress Notes (Signed)
PT Cancellation Note  ___Treatment cancelled today due to medical issues with patient which prohibited therapy  _X_ Treatment cancelled today due to patient receiving procedure or test ......Marland KitchenMarland KitchenLexiscan Myoview at [facility] this am  ___ Treatment cancelled today due to patient's refusal to participate   ___ Treatment cancelled today due to  Felecia Shelling  PTA [facility]  Acute  Rehab Pager     [phone]

## 2012-06-02 NOTE — Progress Notes (Signed)
Attending MD, patient chose [facility] for [facility] services and would be appropriate for their Disease Management program for DM/ CHF; please order Disease Management program HHRN/PT at discharge if in agreement; B Ave Filter RN,BSN,MHA

## 2012-06-02 NOTE — Care Management Note (Signed)
    Page 1 of 2   06/02/2012     5:41:17 PM   CARE MANAGEMENT NOTE 06/02/2012  Patient:  [name], [name]   Account Number:  [id number]  Date Initiated:  06/01/2012  Documentation initiated by:  Jiles Crocker  Subjective/Objective Assessment:   ADMITTED WITH ABDOMINAL AND CHEST PAIN     Action/Plan:   PCP: Romero Belling, MD  LIVES AT HOME WITH SPOUSE; POSSIBLY NEED HHC AT DISCHARGE; CM WILL CONTINUE TO FOLLOW FOR DCP   Anticipated DC Date:  06/02/2012   Anticipated DC Plan:  HOME/SELF CARE      DC Planning Services  CM consult      [facility] Choice  HOME HEALTH   Choice offered to / List presented to:  C-1 Patient        HH arranged  HH-1 RN  HH-2 PT  HH-10 DISEASE MANAGEMENT      HH agency  [facility]   Status of service:  Completed, signed off Medicare Important Message given?  YES (If response is "NO", the following Medicare IM given date fields will be blank) Date Medicare IM given:  06/02/2012 Date Additional Medicare IM given:    Discharge Disposition:  HOME W HOME HEALTH SERVICES  Per UR Regulation:  Reviewed for med. necessity/level of care/duration of stay  If discussed at Long Length of Stay Meetings, dates discussed:    Comments:  06/02/12 1730 Clinton Gallant, RN, CCM Cm spoke with pt confirmed d/c home IM completed Pt given a copy Other copy placed in chart along with fax confirmation for clinicals/orders faxed to [facility] [phone] Pt awaiting return of husband with her clothes Female family member at bedside  06/02/2012 4:52 PM Clinton Gallant, RN, CCM [facility] ED CM contacted by unit Rn for pt to clarify what needs to be done for patient to complete referral Cm reviewed unit Cm notes Last note requesting orders from attending MD that need to be faxed to [facility] (home health choice per unit CM notes)  06/01/2012- B CHANDLER RN,BSN,MHA

## 2012-06-03 LAB — GLUCOSE, CAPILLARY: Glucose-Capillary: 251 mg/dL — ABNORMAL HIGH (ref 70–99)

## 2012-06-03 LAB — STOOL CULTURE

## 2012-06-16 ENCOUNTER — Ambulatory Visit (INDEPENDENT_AMBULATORY_CARE_PROVIDER_SITE_OTHER): Payer: Medicare Other | Admitting: Physician Assistant

## 2012-06-16 ENCOUNTER — Telehealth: Payer: Self-pay | Admitting: *Deleted

## 2012-06-16 ENCOUNTER — Encounter: Payer: Self-pay | Admitting: Physician Assistant

## 2012-06-16 ENCOUNTER — Encounter (INDEPENDENT_AMBULATORY_CARE_PROVIDER_SITE_OTHER): Payer: Medicare Other

## 2012-06-16 VITALS — BP 122/64 | HR 63 | Ht 66.0 in | Wt 187.8 lb

## 2012-06-16 DIAGNOSIS — I10 Essential (primary) hypertension: Secondary | ICD-10-CM

## 2012-06-16 DIAGNOSIS — I429 Cardiomyopathy, unspecified: Secondary | ICD-10-CM

## 2012-06-16 DIAGNOSIS — I5042 Chronic combined systolic (congestive) and diastolic (congestive) heart failure: Secondary | ICD-10-CM

## 2012-06-16 DIAGNOSIS — I428 Other cardiomyopathies: Secondary | ICD-10-CM

## 2012-06-16 DIAGNOSIS — N189 Chronic kidney disease, unspecified: Secondary | ICD-10-CM

## 2012-06-16 DIAGNOSIS — I2510 Atherosclerotic heart disease of native coronary artery without angina pectoris: Secondary | ICD-10-CM

## 2012-06-16 DIAGNOSIS — E785 Hyperlipidemia, unspecified: Secondary | ICD-10-CM

## 2012-06-16 DIAGNOSIS — I6529 Occlusion and stenosis of unspecified carotid artery: Secondary | ICD-10-CM

## 2012-06-16 LAB — BASIC METABOLIC PANEL
BUN: 40 mg/dL — ABNORMAL HIGH (ref 6–23)
Chloride: 98 mEq/L (ref 96–112)
GFR: 40.39 mL/min — ABNORMAL LOW (ref >60.00)
Potassium: 3.1 mEq/L — ABNORMAL LOW (ref 3.5–5.1)
Sodium: 140 mEq/L (ref 135–145)

## 2012-06-16 LAB — CBC WITH DIFFERENTIAL/PLATELET
Basophils Relative: 0.4 % (ref 0.0–3.0)
Eosinophils Absolute: 0.1 10*3/uL (ref 0.0–0.7)
Lymphocytes Relative: 19.9 % (ref 12.0–46.0)
MCHC: 33.4 g/dL (ref 30.0–36.0)
Monocytes Absolute: 0.6 10*3/uL (ref 0.1–1.0)
Neutrophils Relative %: 71.2 % (ref 43.0–77.0)
Platelets: 262 10*3/uL (ref 150.0–400.0)
RBC: 5.02 Mil/uL (ref 3.87–5.11)
WBC: 8.9 10*3/uL (ref 4.5–10.5)

## 2012-06-16 MED ORDER — POTASSIUM CHLORIDE CRYS ER 20 MEQ PO TBCR: 40.0000 meq | EXTENDED_RELEASE_TABLET | Freq: Two times a day (BID) | ORAL | Status: DC

## 2012-06-16 NOTE — Telephone Encounter (Signed)
Message copied by Tarri Fuller on Tue Jun 16, 2012  6:06 PM ------      Message from: [place], [place] T      Created: Tue Jun 16, 2012  4:45 PM       Increase K+ to 40 mEq bid.      Hgb ok.      Renal function stable.      BNP lower.  Continue same dose of Lasix      Check BMET in 1 week.      Tereso Newcomer, PA-C  4:45 PM 06/16/2012

## 2012-06-16 NOTE — Telephone Encounter (Signed)
pt notified about lab results and to increase K+ to 40 meq bid, no change in lasix though, bmet 1/10 when pt sees Dr. Everardo All

## 2012-06-16 NOTE — Progress Notes (Signed)
[address] [place], [place]  [address] Phone: [phone], Fax:  [phone]  Date:  06/16/2012   Name:  [name]   DOB:  [birth year]   MRN:  [id number]  PCP:  Romero Belling, MD  Primary Cardiologist:  Dr. Valera Castle  Primary Electrophysiologist:  None    History of Present Illness: [name] is a 78 y.o. female who returns for follow up after recent admission to the hospital for chest pain.  She has a h/o diastolic CHF, nonobstructive CAD, DM2, HTN, HL, sleep apnea and CKD.  Admitted to [facility] 08/2010 with a/c diastolic CHF and with elevated enzymes that were 2/2 CHF + CKD.  Last seen in clinic by Herma Carson, PA-C 05/28/12.    She was admitted 12/14-12-17. She complained of chest pain with mild exertion that would resolve with NTG. She also noted abdominal pain and diarrhea. ECG was felt to be abnormal. She was seen by cardiology in consultation. CT of the abdomen and pelvis was negative for acute findings. CXR was normal. She had inferolateral TWI.  Prior ECGs were reviewed by Dr. Jens Som. EKG changes were felt to be old. Cardiac enzymes were negative for MI. Echo 05/31/12: Severe LVH, EF 40-45%, diffuse HK, grade 1 diastolic dysfunction, mild to moderate calcified aortic valve annulus, mild to moderate aortic stenosis, MAC, mild MR, moderate LAE. Given her mildly reduced LV function, inpatient Myoview was arranged for risk stratification. Myoview 06/02/12: Low anterior wall scar, no ischemia, EF 37%. It is felt that she would be high risk for contrast-induced nephropathy given her chronic kidney disease. She did have worsening creatinine upon admission in the setting of diarrhea. Her diuretics were initially held but restarted at discharge.  Since d/c, she is doing better.  Denies any chest pain.  Sleeps on 2 pillows and has done so for several years.  No change.  Sleeps with CPAP.  Notes some increased LE edema.  Weights are down.  No  syncope.  Notes some cough.  She is probably NYHA Class III.    Labs (12/13):  K 4.3, creatinine 1.40, ALT 16, Hgb 13.1, TSH 1.576   Wt Readings from Last 3 Encounters:  06/16/12 187 lb 12.8 oz (85.186 kg)  06/02/12 212 lb 8 oz (96.389 kg)  05/28/12 215 lb (97.523 kg)     Past Medical History  Diagnosis Date  . Uterine cancer   . Diabetes mellitus     type II; peripheral neuropathy  . Hypertension   . GERD (gastroesophageal reflux disease)   . Peripheral vascular disease   . Obesity   . Dyslipidemia   . Diastolic CHF, chronic     EF 50-55%, mild LVH and grade 1 diast. Dysfxn  . Osteoarthritis cervical spine   . DM retinopathy   . Sleep apnea     with CPAP  . History of shingles   . COPD (chronic obstructive pulmonary disease)   . Depression   . Peptic ulcer disease     duodenal  . Peptic stricture of esophagus   . Hiatal hernia   . Diverticulosis   . Arthritis   . ASCVD (arteriosclerotic cardiovascular disease)     on MRI brin  . CAD (coronary artery disease)     a. s/p multiple caths with nonobs CAD;   b. cath 1/10: pLAD 20%, mLAD 40%, pCFX 20%, mCFX 40%, pRCA 60-70%;   c.  Myoview 06/02/12: Low anterior wall scar, no ischemia, EF 37%  .  Cardiomyopathy     a. Echo 05/31/12: Severe LVH, EF 40-45%, diffuse HK, grade 1 diastolic dysfunction, mild to moderate calcified aortic valve annulus, mild to moderate aortic stenosis, MAC, mild MR, moderate LAE  . Asthma     Mild  . Pruritic condition     Idiopathic  . Hyperlipidemia   . Zoster   . Noncompliance   . CKD (chronic kidney disease)     Dr. Eliott Nine    Current Outpatient Prescriptions  Medication Sig Dispense Refill  . acetaminophen (TYLENOL) 500 MG tablet Take 1,000 mg by mouth every 6 (six) hours as needed. For pain      . aspirin EC 325 MG tablet Take 325 mg by mouth daily.      Marland Kitchen ezetimibe (ZETIA) 10 MG tablet Take 10 mg by mouth daily.       . furosemide (LASIX) 80 MG tablet Take 160 mg by mouth 2 (two) times  daily.      . hydrALAZINE (APRESOLINE) 25 MG tablet Take 25 mg by mouth 3 (three) times daily.      . insulin NPH-insulin regular (NOVOLIN 70/30) (70-30) 100 UNIT/ML injection Inject 120 Units into the skin daily with breakfast.  40 mL  12  . isosorbide dinitrate (ISORDIL) 30 MG tablet Take 30 mg by mouth 2 (two) times daily.       Marland Kitchen labetalol (NORMODYNE) 200 MG tablet Take 100 mg by mouth 2 (two) times daily.       Marland Kitchen latanoprost (XALATAN) 0.005 % ophthalmic solution Place 1 drop into both eyes at bedtime.      . methylcellulose (ARTIFICIAL TEARS) 1 % ophthalmic solution Place 1 drop into both eyes daily. For dry eyes      . metolazone (ZAROXOLYN) 5 MG tablet Take 1 tablet (5 mg total) by mouth daily as needed. Daily as needed for weight gain over 221 pounds  30 tablet  3  . nitroGLYCERIN (NITROSTAT) 0.4 MG SL tablet Place 0.4 mg under the tongue every 5 (five) minutes as needed. For chest pain/MAX 3 doses      . potassium chloride SA (K-DUR,KLOR-CON) 20 MEQ tablet Take 1 tablet (20 mEq total) by mouth 3 (three) times daily.  90 tablet  5  . rosuvastatin (CRESTOR) 40 MG tablet Take 40 mg by mouth every morning.       . Vitamin D, Ergocalciferol, (DRISDOL) 50000 UNITS CAPS Take 50,000 Units by mouth 2 (two) times a week. Monday and Thursday        Allergies: Allergies  Allergen Reactions  . Iohexol      Code: RASH, Desc: JENNIFER STATES ON PT'S CHART ALLERGIC TO IV DYE 09/04/07/RM, Onset Date: 16109604     Social History:  The patient  reports that she has never smoked. She does not have any smokeless tobacco history on file. She reports that she does not drink alcohol or use illicit drugs.   ROS:  Please see the history of present illness.   All other systems reviewed and negative.   PHYSICAL EXAM: VS:  BP 122/64  Pulse 63  Ht 5\' 6"  (1.676 m)  Wt 187 lb 12.8 oz (85.186 kg)  BMI 30.31 kg/m2 Well nourished, well developed, in no acute distress HEENT: normal Neck: no JVD at 90  degrees Cardiac:  normal S1, S2; RRR; no murmur Lungs:  clear to auscultation bilaterally, no wheezing, rhonchi or rales Abd: soft, nontender, no hepatomegaly Ext: trace to 1+ bilat ankle edema Skin: warm and dry  Neuro:  CNs 2-12 intact, no focal abnormalities noted  EKG:  NSR, HR 63, inf-lat TWI, septal Q waves, No change since last tracing     ASSESSMENT AND PLAN:  1. Coronary Artery Disease:  Nonobstructive CAD by cath in the past.  Recent nuclear scan with anterior scar but no ischemia.  With advanced age and CKD, she would be high risk for contrast induced nephropathy with cardiac cath.  She has stable symptoms on medical Rx.  I had a long d/w the patient.  At this point, I recommend medical Rx.  She agrees.  Continue ASA, statin, nitrates.    2. Cardiomyopathy:  No ACE or ARB due to CKD.  Continue hydralazine, nitrates, labetalol.  3. Chronic Combined Systolic and Diastolic CHF:  Volume stable.  She has chronic dyspnea.  Will check BMET and BNP today.  Adjust Lasix if BNP higher.    4. Chronic Kidney Disease:  Follow up with Dr. Eliott Nine.  Check BMET and CBC today.    5. Hypertension:  Controlled.  Continue current therapy.  6. Hyperlipidemia:  Continue statin.    7. Disposition:  Follow up with Dr. Valera Castle in 08/2012 as scheduled.    Signed, Tereso Newcomer, PA-C  12:17 PM 06/16/2012

## 2012-06-16 NOTE — Patient Instructions (Addendum)
Today Labs (CBC/BMET/BNP)  Keep Appointment with Dr. Daleen Squibb 08/25/12

## 2012-06-19 ENCOUNTER — Emergency Department (HOSPITAL_COMMUNITY)
Admission: EM | Admit: 2012-06-19 | Discharge: 2012-06-19 | Disposition: A | Discharge status: Home / Self Care | Payer: Medicare Other | Attending: Emergency Medicine | Admitting: Emergency Medicine

## 2012-06-19 ENCOUNTER — Emergency Department (HOSPITAL_COMMUNITY): Payer: Medicare Other

## 2012-06-19 ENCOUNTER — Encounter (HOSPITAL_COMMUNITY): Payer: Self-pay | Admitting: *Deleted

## 2012-06-19 DIAGNOSIS — Z8709 Personal history of other diseases of the respiratory system: Secondary | ICD-10-CM | POA: Insufficient documentation

## 2012-06-19 DIAGNOSIS — J4489 Other specified chronic obstructive pulmonary disease: Secondary | ICD-10-CM | POA: Insufficient documentation

## 2012-06-19 DIAGNOSIS — E1149 Type 2 diabetes mellitus with other diabetic neurological complication: Secondary | ICD-10-CM | POA: Insufficient documentation

## 2012-06-19 DIAGNOSIS — Z8679 Personal history of other diseases of the circulatory system: Secondary | ICD-10-CM | POA: Insufficient documentation

## 2012-06-19 DIAGNOSIS — Z8711 Personal history of peptic ulcer disease: Secondary | ICD-10-CM | POA: Insufficient documentation

## 2012-06-19 DIAGNOSIS — Z9119 Patient's noncompliance with other medical treatment and regimen: Secondary | ICD-10-CM | POA: Insufficient documentation

## 2012-06-19 DIAGNOSIS — Z91199 Patient's noncompliance with other medical treatment and regimen due to unspecified reason: Secondary | ICD-10-CM | POA: Insufficient documentation

## 2012-06-19 DIAGNOSIS — E669 Obesity, unspecified: Secondary | ICD-10-CM | POA: Insufficient documentation

## 2012-06-19 DIAGNOSIS — K219 Gastro-esophageal reflux disease without esophagitis: Secondary | ICD-10-CM | POA: Insufficient documentation

## 2012-06-19 DIAGNOSIS — Z8542 Personal history of malignant neoplasm of other parts of uterus: Secondary | ICD-10-CM | POA: Insufficient documentation

## 2012-06-19 DIAGNOSIS — G609 Hereditary and idiopathic neuropathy, unspecified: Secondary | ICD-10-CM | POA: Insufficient documentation

## 2012-06-19 DIAGNOSIS — R5381 Other malaise: Secondary | ICD-10-CM | POA: Insufficient documentation

## 2012-06-19 DIAGNOSIS — G4730 Sleep apnea, unspecified: Secondary | ICD-10-CM | POA: Insufficient documentation

## 2012-06-19 DIAGNOSIS — I2510 Atherosclerotic heart disease of native coronary artery without angina pectoris: Secondary | ICD-10-CM | POA: Insufficient documentation

## 2012-06-19 DIAGNOSIS — E1139 Type 2 diabetes mellitus with other diabetic ophthalmic complication: Secondary | ICD-10-CM | POA: Insufficient documentation

## 2012-06-19 DIAGNOSIS — I5032 Chronic diastolic (congestive) heart failure: Secondary | ICD-10-CM | POA: Insufficient documentation

## 2012-06-19 DIAGNOSIS — Z8719 Personal history of other diseases of the digestive system: Secondary | ICD-10-CM | POA: Insufficient documentation

## 2012-06-19 DIAGNOSIS — Z8619 Personal history of other infectious and parasitic diseases: Secondary | ICD-10-CM | POA: Insufficient documentation

## 2012-06-19 DIAGNOSIS — J45909 Unspecified asthma, uncomplicated: Secondary | ICD-10-CM | POA: Insufficient documentation

## 2012-06-19 DIAGNOSIS — E1169 Type 2 diabetes mellitus with other specified complication: Secondary | ICD-10-CM | POA: Insufficient documentation

## 2012-06-19 DIAGNOSIS — E162 Hypoglycemia, unspecified: Secondary | ICD-10-CM

## 2012-06-19 DIAGNOSIS — Z8659 Personal history of other mental and behavioral disorders: Secondary | ICD-10-CM | POA: Insufficient documentation

## 2012-06-19 DIAGNOSIS — E785 Hyperlipidemia, unspecified: Secondary | ICD-10-CM | POA: Insufficient documentation

## 2012-06-19 DIAGNOSIS — Z794 Long term (current) use of insulin: Secondary | ICD-10-CM | POA: Insufficient documentation

## 2012-06-19 DIAGNOSIS — R4182 Altered mental status, unspecified: Secondary | ICD-10-CM | POA: Insufficient documentation

## 2012-06-19 DIAGNOSIS — R0602 Shortness of breath: Secondary | ICD-10-CM | POA: Insufficient documentation

## 2012-06-19 DIAGNOSIS — Z8739 Personal history of other diseases of the musculoskeletal system and connective tissue: Secondary | ICD-10-CM | POA: Insufficient documentation

## 2012-06-19 DIAGNOSIS — J449 Chronic obstructive pulmonary disease, unspecified: Secondary | ICD-10-CM | POA: Insufficient documentation

## 2012-06-19 DIAGNOSIS — H3500 Unspecified background retinopathy: Secondary | ICD-10-CM | POA: Insufficient documentation

## 2012-06-19 DIAGNOSIS — I120 Hypertensive chronic kidney disease with stage 5 chronic kidney disease or end stage renal disease: Secondary | ICD-10-CM | POA: Insufficient documentation

## 2012-06-19 DIAGNOSIS — Z7982 Long term (current) use of aspirin: Secondary | ICD-10-CM | POA: Insufficient documentation

## 2012-06-19 DIAGNOSIS — N185 Chronic kidney disease, stage 5: Secondary | ICD-10-CM | POA: Insufficient documentation

## 2012-06-19 DIAGNOSIS — Z79899 Other long term (current) drug therapy: Secondary | ICD-10-CM | POA: Insufficient documentation

## 2012-06-19 LAB — BASIC METABOLIC PANEL
BUN: 24 mg/dL — ABNORMAL HIGH (ref 6–23)
CO2: 30 mEq/L (ref 19–32)
Calcium: 9.9 mg/dL (ref 8.4–10.5)
Chloride: 98 mEq/L (ref 96–112)
Creatinine, Ser: 1.2 mg/dL — ABNORMAL HIGH (ref 0.50–1.10)
GFR calc Af Amer: 48 mL/min — ABNORMAL LOW (ref >90)
GFR calc non Af Amer: 42 mL/min — ABNORMAL LOW (ref >90)
Glucose, Bld: 205 mg/dL — ABNORMAL HIGH (ref 70–99)
Potassium: 3.4 mEq/L — ABNORMAL LOW (ref 3.5–5.1)
Sodium: 139 mEq/L (ref 135–145)

## 2012-06-19 LAB — CBC WITH DIFFERENTIAL/PLATELET
Basophils Absolute: 0 10*3/uL (ref 0.0–0.1)
Basophils Relative: 0 % (ref 0–1)
Eosinophils Absolute: 0.1 10*3/uL (ref 0.0–0.7)
Eosinophils Relative: 2 % (ref 0–5)
HCT: 42.5 % (ref 36.0–46.0)
Hemoglobin: 13.7 g/dL (ref 12.0–15.0)
Lymphocytes Relative: 15 % (ref 12–46)
Lymphs Abs: 1.1 10*3/uL (ref 0.7–4.0)
MCH: 28.4 pg (ref 26.0–34.0)
MCHC: 32.2 g/dL (ref 30.0–36.0)
MCV: 88.2 fL (ref 78.0–100.0)
Monocytes Absolute: 0.5 10*3/uL (ref 0.1–1.0)
Monocytes Relative: 7 % (ref 3–12)
Neutro Abs: 5.6 10*3/uL (ref 1.7–7.7)
Neutrophils Relative %: 76 % (ref 43–77)
Platelets: 279 10*3/uL (ref 150–400)
RBC: 4.82 MIL/uL (ref 3.87–5.11)
RDW: 13.4 % (ref 11.5–15.5)
WBC: 7.4 10*3/uL (ref 4.0–10.5)

## 2012-06-19 LAB — URINALYSIS, ROUTINE W REFLEX MICROSCOPIC
Bilirubin Urine: NEGATIVE
Glucose, UA: NEGATIVE mg/dL
Ketones, ur: NEGATIVE mg/dL
Leukocytes, UA: NEGATIVE
Nitrite: NEGATIVE
Protein, ur: 100 mg/dL — AB
Specific Gravity, Urine: 1.011 (ref 1.005–1.030)
Urobilinogen, UA: 0.2 mg/dL (ref 0.0–1.0)
pH: 7.5 (ref 5.0–8.0)

## 2012-06-19 LAB — GLUCOSE, CAPILLARY
Glucose-Capillary: 136 mg/dL — ABNORMAL HIGH (ref 70–99)
Glucose-Capillary: 35 mg/dL — CL (ref 70–99)
Glucose-Capillary: 73 mg/dL (ref 70–99)
Glucose-Capillary: 52 mg/dL — ABNORMAL LOW (ref 70–99)
Glucose-Capillary: 122 mg/dL — ABNORMAL HIGH (ref 70–99)
Glucose-Capillary: 104 mg/dL — ABNORMAL HIGH (ref 70–99)

## 2012-06-19 LAB — URINE MICROSCOPIC-ADD ON

## 2012-06-19 LAB — TROPONIN I: Troponin I: <0.3 ng/mL (ref <0.30)

## 2012-06-19 MED ORDER — DEXTROSE 50 % IV SOLN
INTRAVENOUS | Status: AC
  Administered 2012-06-19: 50 mL via INTRAVENOUS
  Filled 2012-06-19: qty 50

## 2012-06-19 MED ORDER — DEXTROSE-NACL 5-0.45 % IV SOLN
INTRAVENOUS | Status: DC
  Administered 2012-06-19: 15:00:00 via INTRAVENOUS

## 2012-06-19 MED ORDER — DEXTROSE 50 % IV SOLN
1.0000 | Freq: Once | INTRAVENOUS | Status: AC
  Administered 2012-06-19: 50 mL via INTRAVENOUS

## 2012-06-19 NOTE — ED Provider Notes (Signed)
History    79 year old female with decreased mental status. Patient with her blood sugar was 52 when she woke up this morning. She ate 2 hot dogs. Patient later found her poorly responsive and "drooling." On EMS arrival patient's blood sugar was 41. She received D10 with improvement of her mental status and her blood sugar. Patient states that she has been taking 120 units of 70/30 insulin daily. Has noticed that when she wakes up in the middle of the night that when she has checked her blood sugar it has been consistently low and also when she checks it in the morning. No trauma. No fever or chills. Pt c/o feeling tired and generally weak. Mild sob. Denies pain anywhere.    CSN: [id number]  Arrival date & time 06/19/12  1311   First MD Initiated Contact with Patient 06/19/12 1401      Chief Complaint  Patient presents with  . Hypoglycemia    (Consider location/radiation/quality/duration/timing/severity/associated sxs/prior treatment) HPI  Past Medical History  Diagnosis Date  . Uterine cancer   . Diabetes mellitus     type II; peripheral neuropathy  . Hypertension   . GERD (gastroesophageal reflux disease)   . Peripheral vascular disease   . Obesity   . Dyslipidemia   . Diastolic CHF, chronic     EF 50-55%, mild LVH and grade 1 diast. Dysfxn  . Osteoarthritis cervical spine   . DM retinopathy   . Sleep apnea     with CPAP  . History of shingles   . COPD (chronic obstructive pulmonary disease)   . Depression   . Peptic ulcer disease     duodenal  . Peptic stricture of esophagus   . Hiatal hernia   . Diverticulosis   . Arthritis   . ASCVD (arteriosclerotic cardiovascular disease)     on MRI brin  . CAD (coronary artery disease)     a. s/p multiple caths with nonobs CAD;   b. cath 1/10: pLAD 20%, mLAD 40%, pCFX 20%, mCFX 40%, pRCA 60-70%;   c.  Myoview 06/02/12: Low anterior wall scar, no ischemia, EF 37%  . Cardiomyopathy     a. Echo 05/31/12: Severe LVH, EF 40-45%,  diffuse HK, grade 1 diastolic dysfunction, mild to moderate calcified aortic valve annulus, mild to moderate aortic stenosis, MAC, mild MR, moderate LAE  . Asthma     Mild  . Pruritic condition     Idiopathic  . Hyperlipidemia   . Zoster   . Noncompliance   . CKD (chronic kidney disease)     Dr. Eliott Nine    Past Surgical History  Procedure Date  . Tubal ligation 1967  . Knee arthroscopy 10/1998    Left  . Craniotomy 1997    Left for SDH  . Cataract extraction, bilateral 2005  . Hernia repair   . Esophagogastroduodenoscopy 04/04/2004  . Spine surgery     C-spine and lumbar surgery  . Cholecystectomy 2010  . Cardiac catheterization   . Dexa 7/05  . Abdominal hysterectomy     Family History  Problem Relation Age of Onset  . Cancer Mother     "Stomach" Cancer  . Diabetes Mother   . Heart disease Mother   . Stomach cancer Mother   . Lymphoma Father   . Kidney disease Paternal Grandmother   . Asthma Other     History  Substance Use Topics  . Smoking status: Never Smoker   . Smokeless tobacco: Not on file  . Alcohol  Use: No     Comment: rare    OB History    Grav Para Term Preterm Abortions TAB SAB Ect Mult Living                  Review of Systems  All systems reviewed and negative, other than as noted in HPI.   Allergies  Iohexol  Home Medications   Current Outpatient Rx  Name  Route  Sig  Dispense  Refill  . ACETAMINOPHEN 500 MG PO TABS   Oral   Take 1,000 mg by mouth every 6 (six) hours as needed. For pain         . ASPIRIN EC 325 MG PO TBEC   Oral   Take 325 mg by mouth daily.         Marland Kitchen EZETIMIBE 10 MG PO TABS   Oral   Take 10 mg by mouth daily.          . FUROSEMIDE 80 MG PO TABS   Oral   Take 160 mg by mouth 2 (two) times daily.         Marland Kitchen HYDRALAZINE HCL 25 MG PO TABS   Oral   Take 25 mg by mouth 3 (three) times daily.         . INSULIN ISOPHANE & REGULAR (70-30) 100 UNIT/ML [place] SUSP   Subcutaneous   Inject 120 Units  into the skin daily with breakfast.   40 mL   12   . ISOSORBIDE DINITRATE 30 MG PO TABS   Oral   Take 30 mg by mouth 2 (two) times daily.          Marland Kitchen LABETALOL HCL 200 MG PO TABS   Oral   Take 100 mg by mouth 2 (two) times daily.          Marland Kitchen LATANOPROST 0.005 % OP SOLN   Both Eyes   Place 1 drop into both eyes at bedtime.         . METHYLCELLULOSE 1 % OP SOLN   Both Eyes   Place 1 drop into both eyes daily. For dry eyes         . METOLAZONE 5 MG PO TABS   Oral   Take 1 tablet (5 mg total) by mouth daily as needed. Daily as needed for weight gain over 221 pounds   30 tablet   3   . NITROGLYCERIN 0.4 MG SL SUBL   Sublingual   Place 0.4 mg under the tongue every 5 (five) minutes as needed. For chest pain/MAX 3 doses         . POTASSIUM CHLORIDE CRYS ER 20 MEQ PO TBCR   Oral   Take 2 tablets (40 mEq total) by mouth 2 (two) times daily.         Marland Kitchen ROSUVASTATIN CALCIUM 40 MG PO TABS   Oral   Take 40 mg by mouth every morning.          Marland Kitchen VITAMIN D (ERGOCALCIFEROL) 50000 UNITS PO CAPS   Oral   Take 50,000 Units by mouth 2 (two) times a week. Monday and Thursday           BP 216/72  Pulse 64  Temp 97.9 F (36.6 C) (Oral)  Resp 18  SpO2 96%  Physical Exam  Nursing note and vitals reviewed. Constitutional: She appears well-developed and well-nourished. No distress.       Laying in bed. NAD. Obese.  HENT:  Head: Normocephalic  and atraumatic.  Eyes: Conjunctivae normal are normal. Right eye exhibits no discharge. Left eye exhibits no discharge.  Neck: Neck supple.  Cardiovascular: Normal rate, regular rhythm and normal heart sounds.  Exam reveals no gallop and no friction rub.   No murmur heard. Pulmonary/Chest: Effort normal and breath sounds normal. No respiratory distress.  Abdominal: Soft. She exhibits no distension. There is no tenderness.  Musculoskeletal: She exhibits no edema and no tenderness.  Neurological: She is alert.       Drowsy but  awakens to voice. Answering questions appropriately.   Skin: Skin is warm and dry. She is not diaphoretic.  Psychiatric: She has a normal mood and affect. Her behavior is normal. Thought content normal.    ED Course  Procedures (including critical care time)  Labs Reviewed  GLUCOSE, CAPILLARY - Abnormal; Notable for the following:    Glucose-Capillary 35 (*)     All other components within normal limits  GLUCOSE, CAPILLARY - Abnormal; Notable for the following:    Glucose-Capillary 136 (*)     All other components within normal limits  URINALYSIS, ROUTINE W REFLEX MICROSCOPIC - Abnormal; Notable for the following:    Hgb urine dipstick TRACE (*)     Protein, ur 100 (*)     All other components within normal limits  BASIC METABOLIC PANEL - Abnormal; Notable for the following:    Potassium 3.4 (*)     Glucose, Bld 205 (*)     BUN 24 (*)     Creatinine, Ser 1.20 (*)     GFR calc non Af Amer 42 (*)     GFR calc Af Amer 48 (*)     All other components within normal limits  GLUCOSE, CAPILLARY - Abnormal; Notable for the following:    Glucose-Capillary 52 (*)     All other components within normal limits  GLUCOSE, CAPILLARY - Abnormal; Notable for the following:    Glucose-Capillary 122 (*)     All other components within normal limits  CBC WITH DIFFERENTIAL  TROPONIN I  GLUCOSE, CAPILLARY  URINE MICROSCOPIC-ADD ON   No results found.   1. Hypoglycemia       MDM  79 year old female with decreased mental status. Likely secondary to hypoglycemia.   3:16 PM Patient is now more awake. She is at her baseline per her family. D5 drip was turned off. Will feed patient. If CBG remains stable we'll discharge.  4:13 PM CBG still low but pt remains mentating fine. Eating peanut butter and crackers, sandwich and drinking juice. Will continue to monitor.   4:54 PM CBG improved. Mental status baseline. Pt instructed to decrease insulin to 100u/day until sees her PCP on jan 10th. Pt  being discharged home with husband who can continue to watch. Pt also with daughter that lives next door.      Raeford Razor, MD 06/19/12 1659

## 2012-06-19 NOTE — ED Notes (Signed)
Per EMS, pt from home, reports taking her BG this am-52, ate 2 hotdogs.  Family found pt in her bed later, unresponsive and was "drooling".  BG-41 upon arrival to pt's home, received IV D10-went up to 75.

## 2012-06-19 NOTE — ED Notes (Signed)
ION:[id number]<MW> Expected date:<BR> Expected time:<BR> Means of arrival:<BR> Comments:<BR> Hypoglycemia; stable

## 2012-06-19 NOTE — ED Notes (Signed)
Pt from home, per family pt had a CBG of 40 then took "too much" insulin, ate something and went to bed which is when pt was found by oldest daughter, unresponsive and 911 was called. Pt remains drowsy but arouses easily and is oriented.

## 2012-06-25 ENCOUNTER — Telehealth: Payer: Self-pay | Admitting: *Deleted

## 2012-06-25 NOTE — Telephone Encounter (Signed)
[facility] nurse called to report on patient of Dr, Everardo All, Darcey Nora, of having a 3 # weight gain in 24 hours and is short of breath when laying down. . Patient cb # [phone]..The home health nurse # [phone]. Please advise sue

## 2012-06-25 NOTE — Telephone Encounter (Signed)
[name], please tell the the pt or caregiver to take an extra Lasix tablet of 80 mg now. If SOB worse tonight or feels generally worse, she needs to come to the ED - if not, she should come tomorrow for her appt with Dr. Everardo All for further evaluation.

## 2012-06-25 NOTE — Telephone Encounter (Signed)
PATIENT NOTIFIED OF DR. GHERGHE INSTRUCTIONS AND STRESSED TO PATIENT TO GO TO E.D. IF HER SYMPTOMS WORSEN. PATIENT UNDERSTANDS AND WILL KEEP HER APPT. WITH DR. Everardo All IN THE MORNING.

## 2012-06-26 ENCOUNTER — Encounter: Payer: Self-pay | Admitting: Endocrinology

## 2012-06-26 ENCOUNTER — Ambulatory Visit (INDEPENDENT_AMBULATORY_CARE_PROVIDER_SITE_OTHER): Payer: Medicare Other | Admitting: Endocrinology

## 2012-06-26 VITALS — BP 120/70 | HR 78 | Temp 97.9°F | Wt 217.0 lb

## 2012-06-26 DIAGNOSIS — E1165 Type 2 diabetes mellitus with hyperglycemia: Secondary | ICD-10-CM

## 2012-06-26 DIAGNOSIS — E1129 Type 2 diabetes mellitus with other diabetic kidney complication: Secondary | ICD-10-CM

## 2012-06-26 DIAGNOSIS — N058 Unspecified nephritic syndrome with other morphologic changes: Secondary | ICD-10-CM

## 2012-06-26 NOTE — Patient Instructions (Addendum)
Please reduce your insulin to 80 units each day, with breakfast. check your blood sugar twice a day.  vary the time of day when you check, between before the 3 meals, and at bedtime.  also check if you have symptoms of your blood sugar being too high or too low.  please keep a record of the readings and bring it to your next appointment here.  please call us sooner if your blood sugar goes below 70, or if you have a lot of readings over 200. Please come back for a follow-up appointment in 2 weeks.

## 2012-06-26 NOTE — Progress Notes (Signed)
Subjective:    Patient ID: [name], female    DOB: [birth year], 79 y.o.   MRN: [id number]  HPI Pt returns for f/u of insulin-requiring DM (dx'ed 1985; complicated by nephropathy, peripheral sensory neuropathy, CAD, and PAD; therapy has limited by pt's request for least expensive insulin, and by her need for a simple regimen).  She was seen in ER 1 week ago, with severe hypoglycemia.  She was advised to reduce her insulin to 100 units qam.  Despite this reduction, cbg was 44 early this am.  She is unable to cite any reason why her insulin requirement has decreased.  she brings a record of her cbg's which i have reviewed today.  It varies from 40-50 in am to 300 later in the day.   Past Medical History  Diagnosis Date  . Uterine cancer   . Diabetes mellitus     type II; peripheral neuropathy  . Hypertension   . GERD (gastroesophageal reflux disease)   . Peripheral vascular disease   . Obesity   . Dyslipidemia   . Diastolic CHF, chronic     EF 50-55%, mild LVH and grade 1 diast. Dysfxn  . Osteoarthritis cervical spine   . DM retinopathy   . Sleep apnea     with CPAP  . History of shingles   . COPD (chronic obstructive pulmonary disease)   . Depression   . Peptic ulcer disease     duodenal  . Peptic stricture of esophagus   . Hiatal hernia   . Diverticulosis   . Arthritis   . ASCVD (arteriosclerotic cardiovascular disease)     on MRI brin  . CAD (coronary artery disease)     a. s/p multiple caths with nonobs CAD;   b. cath 1/10: pLAD 20%, mLAD 40%, pCFX 20%, mCFX 40%, pRCA 60-70%;   c.  Myoview 06/02/12: Low anterior wall scar, no ischemia, EF 37%  . Cardiomyopathy     a. Echo 05/31/12: Severe LVH, EF 40-45%, diffuse HK, grade 1 diastolic dysfunction, mild to moderate calcified aortic valve annulus, mild to moderate aortic stenosis, MAC, mild MR, moderate LAE  . Asthma     Mild  . Pruritic condition     Idiopathic  . Hyperlipidemia   . Zoster   . Noncompliance   .  CKD (chronic kidney disease)     Dr. Eliott Nine    Past Surgical History  Procedure Date  . Tubal ligation 1967  . Knee arthroscopy 10/1998    Left  . Craniotomy 1997    Left for SDH  . Cataract extraction, bilateral 2005  . Hernia repair   . Esophagogastroduodenoscopy 04/04/2004  . Spine surgery     C-spine and lumbar surgery  . Cholecystectomy 2010  . Cardiac catheterization   . Dexa 7/05  . Abdominal hysterectomy     History   Social History  . Marital Status: Married    Spouse Name: N/A    Number of Children: 7  . Years of Education: N/A   Occupational History  . Retired    Social History Main Topics  . Smoking status: Never Smoker   . Smokeless tobacco: Not on file  . Alcohol Use: No     Comment: rare  . Drug Use: No  . Sexually Active: No   Other Topics Concern  . Not on file   Social History Narrative  . No narrative on file    Current Outpatient Prescriptions on File Prior to Visit  Medication Sig Dispense Refill  . acetaminophen (TYLENOL) 500 MG tablet Take 1,000 mg by mouth every 6 (six) hours as needed. For pain      . aspirin EC 325 MG tablet Take 325 mg by mouth daily.      Marland Kitchen ezetimibe (ZETIA) 10 MG tablet Take 10 mg by mouth daily.       . furosemide (LASIX) 80 MG tablet Take 160 mg by mouth 2 (two) times daily.      . hydrALAZINE (APRESOLINE) 25 MG tablet Take 25 mg by mouth 3 (three) times daily.      . insulin NPH-insulin regular (NOVOLIN 70/30) (70-30) 100 UNIT/ML injection Inject 80 Units into the skin daily with breakfast.      . isosorbide dinitrate (ISORDIL) 30 MG tablet Take 30 mg by mouth 2 (two) times daily.       Marland Kitchen labetalol (NORMODYNE) 200 MG tablet Take 100 mg by mouth 2 (two) times daily.       Marland Kitchen latanoprost (XALATAN) 0.005 % ophthalmic solution Place 1 drop into both eyes at bedtime.      . metolazone (ZAROXOLYN) 5 MG tablet Take 1 tablet (5 mg total) by mouth daily as needed. Daily as needed for weight gain over 221 pounds  30  tablet  3  . nitroGLYCERIN (NITROSTAT) 0.4 MG SL tablet Place 0.4 mg under the tongue every 5 (five) minutes as needed. For chest pain/MAX 3 doses      . potassium chloride SA (K-DUR,KLOR-CON) 20 MEQ tablet Take 40 mEq by mouth 2 (two) times daily.      . rosuvastatin (CRESTOR) 40 MG tablet Take 40 mg by mouth every morning.       . Vitamin D, Ergocalciferol, (DRISDOL) 50000 UNITS CAPS Take 50,000 Units by mouth 2 (two) times a week. Monday and Thursday        Allergies  Allergen Reactions  . Iohexol      Code: RASH, Desc: JENNIFER STATES ON PT'S CHART ALLERGIC TO IV DYE 09/04/07/RM, Onset Date: 16109604     Family History  Problem Relation Age of Onset  . Cancer Mother     "Stomach" Cancer  . Diabetes Mother   . Heart disease Mother   . Stomach cancer Mother   . Lymphoma Father   . Kidney disease Paternal Grandmother   . Asthma Other    BP 120/70  Pulse 78  Temp 97.9 F (36.6 C) (Oral)  Wt 217 lb (98.431 kg)  SpO2 94%  Review of Systems Denies weight change    Objective:   Physical Exam VITAL SIGNS:  See vs page GENERAL: no distress Gait: normal and steady.    Assessment & Plan:  DM.  It is unclear why her insulin requirements are decreasing.  It is also possible that she has improved compliance with insulin injections.

## 2012-06-29 DIAGNOSIS — I509 Heart failure, unspecified: Secondary | ICD-10-CM

## 2012-06-29 DIAGNOSIS — J449 Chronic obstructive pulmonary disease, unspecified: Secondary | ICD-10-CM

## 2012-06-29 DIAGNOSIS — I5042 Chronic combined systolic (congestive) and diastolic (congestive) heart failure: Secondary | ICD-10-CM

## 2012-06-29 DIAGNOSIS — R269 Unspecified abnormalities of gait and mobility: Secondary | ICD-10-CM

## 2012-07-02 ENCOUNTER — Telehealth: Payer: Self-pay | Admitting: Endocrinology

## 2012-07-02 NOTE — Telephone Encounter (Signed)
Ov tomorrow 

## 2012-07-02 NOTE — Telephone Encounter (Signed)
Patient Information:  Caller Name: Searra  Phone: [phone]  Patient: [name], [name]  Gender: Female  DOB: [birth year]  Age: 79 Years  PCP: Romero Belling (Adults only)  Office Follow Up:  Does the office need to follow up with this patient?: Yes  Instructions For The Office: Report FBS to Dr. Everardo All  RN Note:  Denies any changes in her medication.  States that one morning several weeks ago her fbs was 35 and EMS was called and she was taken to [facility] ED.  She rechecked her bs during this call and same is 211mg .  She had oatmeal and raisins for breakfast with orange juice.  She has not had lunch yet.  Has slight dizziness only but states that she is up and about on her on.  Symptoms  Reason For Call & Symptoms: Patient calling to report low blood sugars, 51, 64, 59, 53, 56, 45, 44 respectively for the past week starting today 1/16.  Reviewed Health History In EMR: Yes  Reviewed Medications In EMR: Yes  Reviewed Allergies In EMR: Yes  Reviewed Surgeries / Procedures: Yes  Date of Onset of Symptoms: 06/25/2012  Guideline(s) Used:  Diabetes - Low Blood Sugar  Disposition Per Guideline:   Discuss with PCP and Callback by Nurse within 1 Hour  Reason For Disposition Reached:   Blood glucose < 70 mg/dl (3.9 mmol/l) or symptomatic AND cause unknown  Advice Given:  N/A

## 2012-07-02 NOTE — Telephone Encounter (Signed)
SEE NOTE BELOW FROM ? CALL A NURSE. PATIENT STATES HER BLOOD SUGAR DID COME BACK UP DURING THE DAY AFTER HER MEALS. SUGGESTED A BEDTIME SNACK TO SEE IF WILL HELP BLOOD SUGAR NOT DROP DURING THE NITE. WILL CALL TOMORROW IF ANY MORE PROBLEMS AND GET APPT. TO FOLLOW UP WITH YOU

## 2012-07-03 ENCOUNTER — Telehealth: Payer: Self-pay | Admitting: Endocrinology

## 2012-07-03 ENCOUNTER — Ambulatory Visit: Payer: Medicare Other | Admitting: Endocrinology

## 2012-07-03 NOTE — Telephone Encounter (Addendum)
Called patient per Dr. George Hugh not to schedule office visit for today 07/03/12, no answer so left message to call back to schedule appt.  The patient returned call and was scheduled for acute visit at 11:15am.  The patient then called back again and stated that she had an appoint with a home health nurse at 11am today, so she reschedule appt for 07/07/12.  This Clinical research associate advised patient to call back if having issues and to call 911 or go to the emergency room if having urgent symptoms over the weekend.

## 2012-07-03 NOTE — Telephone Encounter (Signed)
noted 

## 2012-07-07 ENCOUNTER — Ambulatory Visit (INDEPENDENT_AMBULATORY_CARE_PROVIDER_SITE_OTHER): Payer: Medicare Other | Admitting: Endocrinology

## 2012-07-07 VITALS — BP 130/78 | HR 94 | Temp 97.8°F | Wt 215.0 lb

## 2012-07-07 DIAGNOSIS — E1129 Type 2 diabetes mellitus with other diabetic kidney complication: Secondary | ICD-10-CM

## 2012-07-07 DIAGNOSIS — N058 Unspecified nephritic syndrome with other morphologic changes: Secondary | ICD-10-CM

## 2012-07-07 DIAGNOSIS — E1165 Type 2 diabetes mellitus with hyperglycemia: Secondary | ICD-10-CM

## 2012-07-07 NOTE — Progress Notes (Signed)
Subjective:    Patient ID: [name], female    DOB: Jan 28, 1930, 79 y.o.   MRN: [id number]  HPI Pt returns for f/u of insulin-requiring DM (dx'ed 1985; complicated by nephropathy, peripheral sensory neuropathy, CAD, and PAD; therapy has limited by pt's request for least expensive insulin, and by her need for a simple regimen; she was seen in ER in early 2014, with severe hypoglycemia).  pt states she feels well in general.  she brings a record of her cbg's which i have reviewed today.  It varies from 76-280.  It is in general higher as the day goes on.  She has been dividing her insulin throughout the day. Past Medical History  Diagnosis Date  . Uterine cancer   . Diabetes mellitus     type II; peripheral neuropathy  . Hypertension   . GERD (gastroesophageal reflux disease)   . Peripheral vascular disease   . Obesity   . Dyslipidemia   . Diastolic CHF, chronic     EF 50-55%, mild LVH and grade 1 diast. Dysfxn  . Osteoarthritis cervical spine   . DM retinopathy   . Sleep apnea     with CPAP  . History of shingles   . COPD (chronic obstructive pulmonary disease)   . Depression   . Peptic ulcer disease     duodenal  . Peptic stricture of esophagus   . Hiatal hernia   . Diverticulosis   . Arthritis   . ASCVD (arteriosclerotic cardiovascular disease)     on MRI brin  . CAD (coronary artery disease)     a. s/p multiple caths with nonobs CAD;   b. cath 1/10: pLAD 20%, mLAD 40%, pCFX 20%, mCFX 40%, pRCA 60-70%;   c.  Myoview 06/02/12: Low anterior wall scar, no ischemia, EF 37%  . Cardiomyopathy     a. Echo 05/31/12: Severe LVH, EF 40-45%, diffuse HK, grade 1 diastolic dysfunction, mild to moderate calcified aortic valve annulus, mild to moderate aortic stenosis, MAC, mild MR, moderate LAE  . Asthma     Mild  . Pruritic condition     Idiopathic  . Hyperlipidemia   . Zoster   . Noncompliance   . CKD (chronic kidney disease)     Dr. Eliott Nine    Past Surgical History    Procedure Date  . Tubal ligation 1967  . Knee arthroscopy 10/1998    Left  . Craniotomy 1997    Left for SDH  . Cataract extraction, bilateral 2005  . Hernia repair   . Esophagogastroduodenoscopy 04/04/2004  . Spine surgery     C-spine and lumbar surgery  . Cholecystectomy 2010  . Cardiac catheterization   . Dexa 7/05  . Abdominal hysterectomy     History   Social History  . Marital Status: Married    Spouse Name: N/A    Number of Children: 7  . Years of Education: N/A   Occupational History  . Retired    Social History Main Topics  . Smoking status: Never Smoker   . Smokeless tobacco: Not on file  . Alcohol Use: No     Comment: rare  . Drug Use: No  . Sexually Active: No   Other Topics Concern  . Not on file   Social History Narrative  . No narrative on file    Current Outpatient Prescriptions on File Prior to Visit  Medication Sig Dispense Refill  . acetaminophen (TYLENOL) 500 MG tablet Take 1,000 mg by mouth every  6 (six) hours as needed. For pain      . aspirin EC 325 MG tablet Take 325 mg by mouth daily.      Marland Kitchen ezetimibe (ZETIA) 10 MG tablet Take 10 mg by mouth daily.       . furosemide (LASIX) 80 MG tablet Take 160 mg by mouth 2 (two) times daily.      . hydrALAZINE (APRESOLINE) 25 MG tablet Take 25 mg by mouth 3 (three) times daily.      . insulin NPH-insulin regular (NOVOLIN 70/30) (70-30) 100 UNIT/ML injection Inject 80 Units into the skin daily with breakfast.      . isosorbide dinitrate (ISORDIL) 30 MG tablet Take 30 mg by mouth 2 (two) times daily.       Marland Kitchen labetalol (NORMODYNE) 200 MG tablet Take 100 mg by mouth 2 (two) times daily.       Marland Kitchen latanoprost (XALATAN) 0.005 % ophthalmic solution Place 1 drop into both eyes at bedtime.      . metolazone (ZAROXOLYN) 5 MG tablet Take 1 tablet (5 mg total) by mouth daily as needed. Daily as needed for weight gain over 221 pounds  30 tablet  3  . nitroGLYCERIN (NITROSTAT) 0.4 MG SL tablet Place 0.4 mg under  the tongue every 5 (five) minutes as needed. For chest pain/MAX 3 doses      . potassium chloride SA (K-DUR,KLOR-CON) 20 MEQ tablet Take 40 mEq by mouth 2 (two) times daily.      . rosuvastatin (CRESTOR) 40 MG tablet Take 40 mg by mouth every morning.       . Vitamin D, Ergocalciferol, (DRISDOL) 50000 UNITS CAPS Take 50,000 Units by mouth 2 (two) times a week. Monday and Thursday        Allergies  Allergen Reactions  . Iohexol      Code: RASH, Desc: JENNIFER STATES ON PT'S CHART ALLERGIC TO IV DYE 09/04/07/RM, Onset Date: [id number]     Family History  Problem Relation Age of Onset  . Cancer Mother     "Stomach" Cancer  . Diabetes Mother   . Heart disease Mother   . Stomach cancer Mother   . Lymphoma Father   . Kidney disease Paternal Grandmother   . Asthma Other     BP 130/78  Pulse 94  Temp 97.8 F (36.6 C) (Oral)  Wt 215 lb (97.523 kg)  SpO2 95%    Review of Systems denies hypoglycemia    Objective:   Physical Exam VITAL SIGNS:  See vs page GENERAL: no distress PSYCH: Alert and oriented x 3.  Does not appear anxious nor depressed.        Assessment & Plan:  DM: therapy limited by noncompliance with insulin dosing.  i'll do the best i can.

## 2012-07-07 NOTE — Patient Instructions (Addendum)
Please continue your insulin, 80 units each day, with breakfast.  Take it all then.  If this causes your sugar to go low, please call us.  check your blood sugar twice a day.  vary the time of day when you check, between before the 3 meals, and at bedtime.  also check if you have symptoms of your blood sugar being too high or too low.  please keep a record of the readings and bring it to your next appointment here.  please call us sooner if your blood sugar goes below 70, or if you have a lot of readings over 200.   Please come back for a follow-up appointment in 2 months.

## 2012-07-10 ENCOUNTER — Telehealth: Payer: Self-pay | Admitting: Cardiology

## 2012-07-10 NOTE — Telephone Encounter (Signed)
Orders given to [facility] and she will get BMET on Monday and evaluate patient

## 2012-07-10 NOTE — Telephone Encounter (Signed)
Went over Lasix dose with HHN and she had Lasix 80 mg twice a day, did call patient to verify. Patient states she has some swelling in her ankles and feet. Is taking Lasix 80 mg 2 twice a day.  Home health able to get labs next week. Left message for HHN to call back with vitals.

## 2012-07-10 NOTE — Telephone Encounter (Signed)
Heart rate 76 , blood pressure 126/82, O2Sat 95 % temp 97.4.   Reviewed with Dr Antoine Poche and will have patient take a Zaroxolyn today and tomorrow, advised patient. Did review potassium dose with patient and she is only taking K+ 20 meq 3 a day, she did not increase as advised in December by Okey Regal. K+ in the hospital was 3.4 on 06/19/12, will have her increase to 4 a day as [place] PA had recommended. Will have HHN check BMET on Monday, left message for her to call back. Did advise patient if worse to go to ED.

## 2012-07-10 NOTE — Telephone Encounter (Signed)
New problem:   Per request from home health nurse to call the patient.    C/O weight gain on yesterday  205 . This am 213. SOB.

## 2012-07-10 NOTE — Telephone Encounter (Signed)
Will forward to Dr Daleen Squibb and Minda Ditto RN for review

## 2012-07-13 ENCOUNTER — Encounter: Payer: Self-pay | Admitting: Cardiology

## 2012-07-15 NOTE — Telephone Encounter (Signed)
I spoke with pt about lab results and edema.  Mrs. [name] says that she has been having a "bout of diarrhea" today and is taking Immodium.  She will decrease her potassium back to 3 per day ( ) She states her legs, feet, and ankles have gone down "quite a bit" Her HHN will be out to see her this Friday.  Weight today is 212 Denies any chest discomfort, angina & feels her little bit of  SOB may be from her diarrhea. Mylo Red RN

## 2012-08-05 ENCOUNTER — Telehealth: Payer: Self-pay

## 2012-08-05 NOTE — Telephone Encounter (Signed)
[name] nurse called, pt's blood sugar was 443 thinks this is due to her Labetalol, is it possible to change this rx to different bp med, please advise, pt's # [phone]

## 2012-08-05 NOTE — Telephone Encounter (Signed)
Please continue same.  The effect of glucose is very small

## 2012-08-25 ENCOUNTER — Ambulatory Visit: Payer: Medicare Other | Admitting: Cardiology

## 2012-08-25 ENCOUNTER — Other Ambulatory Visit (HOSPITAL_COMMUNITY): Payer: Self-pay | Admitting: Obstetrics & Gynecology

## 2012-08-25 DIAGNOSIS — Z1231 Encounter for screening mammogram for malignant neoplasm of breast: Secondary | ICD-10-CM

## 2012-08-28 ENCOUNTER — Ambulatory Visit (HOSPITAL_COMMUNITY)
Admission: RE | Admit: 2012-08-28 | Discharge: 2012-08-28 | Disposition: A | Discharge status: Home / Self Care | Payer: Medicare Other | Source: Ambulatory Visit | Attending: Obstetrics & Gynecology | Admitting: Obstetrics & Gynecology

## 2012-08-28 DIAGNOSIS — Z1231 Encounter for screening mammogram for malignant neoplasm of breast: Secondary | ICD-10-CM | POA: Insufficient documentation

## 2012-08-29 ENCOUNTER — Other Ambulatory Visit: Payer: Self-pay

## 2012-08-29 ENCOUNTER — Encounter (HOSPITAL_COMMUNITY): Payer: Self-pay

## 2012-08-29 ENCOUNTER — Inpatient Hospital Stay (HOSPITAL_COMMUNITY)
Admission: EM | Admit: 2012-08-29 | Discharge: 2012-09-09 | DRG: 240 | Disposition: A | Discharge status: Skilled Nursing Facility | Payer: Medicare Other | Attending: Vascular Surgery | Admitting: Vascular Surgery

## 2012-08-29 DIAGNOSIS — K219 Gastro-esophageal reflux disease without esophagitis: Secondary | ICD-10-CM | POA: Diagnosis present

## 2012-08-29 DIAGNOSIS — N189 Chronic kidney disease, unspecified: Secondary | ICD-10-CM | POA: Diagnosis present

## 2012-08-29 DIAGNOSIS — J4489 Other specified chronic obstructive pulmonary disease: Secondary | ICD-10-CM | POA: Diagnosis present

## 2012-08-29 DIAGNOSIS — I509 Heart failure, unspecified: Secondary | ICD-10-CM | POA: Diagnosis present

## 2012-08-29 DIAGNOSIS — I739 Peripheral vascular disease, unspecified: Secondary | ICD-10-CM

## 2012-08-29 DIAGNOSIS — I129 Hypertensive chronic kidney disease with stage 1 through stage 4 chronic kidney disease, or unspecified chronic kidney disease: Secondary | ICD-10-CM | POA: Diagnosis present

## 2012-08-29 DIAGNOSIS — L89109 Pressure ulcer of unspecified part of back, unspecified stage: Secondary | ICD-10-CM | POA: Diagnosis present

## 2012-08-29 DIAGNOSIS — R509 Fever, unspecified: Secondary | ICD-10-CM | POA: Diagnosis not present

## 2012-08-29 DIAGNOSIS — E785 Hyperlipidemia, unspecified: Secondary | ICD-10-CM | POA: Diagnosis present

## 2012-08-29 DIAGNOSIS — R32 Unspecified urinary incontinence: Secondary | ICD-10-CM | POA: Diagnosis not present

## 2012-08-29 DIAGNOSIS — M79672 Pain in left foot: Secondary | ICD-10-CM

## 2012-08-29 DIAGNOSIS — I5042 Chronic combined systolic (congestive) and diastolic (congestive) heart failure: Secondary | ICD-10-CM | POA: Diagnosis present

## 2012-08-29 DIAGNOSIS — I10 Essential (primary) hypertension: Secondary | ICD-10-CM | POA: Diagnosis present

## 2012-08-29 DIAGNOSIS — E1142 Type 2 diabetes mellitus with diabetic polyneuropathy: Secondary | ICD-10-CM | POA: Diagnosis present

## 2012-08-29 DIAGNOSIS — N058 Unspecified nephritic syndrome with other morphologic changes: Secondary | ICD-10-CM | POA: Diagnosis present

## 2012-08-29 DIAGNOSIS — E1149 Type 2 diabetes mellitus with other diabetic neurological complication: Secondary | ICD-10-CM | POA: Diagnosis present

## 2012-08-29 DIAGNOSIS — J449 Chronic obstructive pulmonary disease, unspecified: Secondary | ICD-10-CM | POA: Diagnosis present

## 2012-08-29 DIAGNOSIS — I998 Other disorder of circulatory system: Secondary | ICD-10-CM | POA: Diagnosis present

## 2012-08-29 DIAGNOSIS — I2510 Atherosclerotic heart disease of native coronary artery without angina pectoris: Secondary | ICD-10-CM | POA: Diagnosis present

## 2012-08-29 DIAGNOSIS — Z91041 Radiographic dye allergy status: Secondary | ICD-10-CM

## 2012-08-29 DIAGNOSIS — E1129 Type 2 diabetes mellitus with other diabetic kidney complication: Secondary | ICD-10-CM | POA: Diagnosis present

## 2012-08-29 DIAGNOSIS — L8992 Pressure ulcer of unspecified site, stage 2: Secondary | ICD-10-CM | POA: Diagnosis present

## 2012-08-29 DIAGNOSIS — I70299 Other atherosclerosis of native arteries of extremities, unspecified extremity: Principal | ICD-10-CM | POA: Diagnosis present

## 2012-08-29 DIAGNOSIS — N179 Acute kidney failure, unspecified: Secondary | ICD-10-CM | POA: Diagnosis not present

## 2012-08-29 DIAGNOSIS — E669 Obesity, unspecified: Secondary | ICD-10-CM | POA: Diagnosis present

## 2012-08-29 DIAGNOSIS — M79609 Pain in unspecified limb: Secondary | ICD-10-CM

## 2012-08-29 DIAGNOSIS — G4733 Obstructive sleep apnea (adult) (pediatric): Secondary | ICD-10-CM | POA: Diagnosis present

## 2012-08-29 DIAGNOSIS — Z6834 Body mass index (BMI) 34.0-34.9, adult: Secondary | ICD-10-CM

## 2012-08-29 DIAGNOSIS — G9340 Encephalopathy, unspecified: Secondary | ICD-10-CM

## 2012-08-29 LAB — BASIC METABOLIC PANEL
BUN: 18 mg/dL (ref 6–23)
CO2: 25 mEq/L (ref 19–32)
Calcium: 9.5 mg/dL (ref 8.4–10.5)
Chloride: 94 mEq/L — ABNORMAL LOW (ref 96–112)
Creatinine, Ser: 1.12 mg/dL — ABNORMAL HIGH (ref 0.50–1.10)
GFR calc Af Amer: 52 mL/min — ABNORMAL LOW (ref >90)
GFR calc non Af Amer: 45 mL/min — ABNORMAL LOW (ref >90)
Glucose, Bld: 143 mg/dL — ABNORMAL HIGH (ref 70–99)
Potassium: 4.1 mEq/L (ref 3.5–5.1)
Sodium: 133 mEq/L — ABNORMAL LOW (ref 135–145)

## 2012-08-29 LAB — PROTIME-INR
INR: 0.94 (ref 0.00–1.49)
Prothrombin Time: 12.5 seconds (ref 11.6–15.2)

## 2012-08-29 LAB — CBC
HCT: 49.2 % — ABNORMAL HIGH (ref 36.0–46.0)
Hemoglobin: 16.2 g/dL — ABNORMAL HIGH (ref 12.0–15.0)
MCH: 28.9 pg (ref 26.0–34.0)
MCHC: 32.9 g/dL (ref 30.0–36.0)
MCV: 87.9 fL (ref 78.0–100.0)
Platelets: 249 10*3/uL (ref 150–400)
RBC: 5.6 MIL/uL — ABNORMAL HIGH (ref 3.87–5.11)
RDW: 14.2 % (ref 11.5–15.5)
WBC: 12.9 10*3/uL — ABNORMAL HIGH (ref 4.0–10.5)

## 2012-08-29 LAB — URINALYSIS, ROUTINE W REFLEX MICROSCOPIC
Nitrite: NEGATIVE
Protein, ur: >300 mg/dL — AB
Specific Gravity, Urine: 1.026 (ref 1.005–1.030)
Urobilinogen, UA: 1 mg/dL (ref 0.0–1.0)

## 2012-08-29 LAB — GLUCOSE, CAPILLARY: Glucose-Capillary: 211 mg/dL — ABNORMAL HIGH (ref 70–99)

## 2012-08-29 LAB — URINE MICROSCOPIC-ADD ON

## 2012-08-29 LAB — APTT: aPTT: 34 seconds (ref 24–37)

## 2012-08-29 LAB — HEPARIN LEVEL (UNFRACTIONATED): Heparin Unfractionated: 0.54 IU/mL (ref 0.30–0.70)

## 2012-08-29 MED ORDER — METOPROLOL TARTRATE 1 MG/ML IV SOLN: 2.0000 mg | INTRAVENOUS | Status: DC | PRN

## 2012-08-29 MED ORDER — MORPHINE SULFATE 4 MG/ML IJ SOLN
6.0000 mg | Freq: Once | INTRAMUSCULAR | Status: AC
  Administered 2012-08-29: 4 mg via INTRAVENOUS
  Filled 2012-08-29: qty 1

## 2012-08-29 MED ORDER — POTASSIUM CHLORIDE CRYS ER 20 MEQ PO TBCR: 20.0000 meq | EXTENDED_RELEASE_TABLET | Freq: Once | ORAL | Status: DC

## 2012-08-29 MED ORDER — ONDANSETRON HCL 4 MG/2ML IJ SOLN: 4.0000 mg | Freq: Four times a day (QID) | INTRAMUSCULAR | Status: DC | PRN

## 2012-08-29 MED ORDER — INSULIN ASPART 100 UNIT/ML ~~LOC~~ SOLN
0.0000 [IU] | Freq: Three times a day (TID) | SUBCUTANEOUS | Status: DC
  Administered 2012-08-29: 3 [IU] via SUBCUTANEOUS
  Administered 2012-08-30: 8 [IU] via SUBCUTANEOUS
  Administered 2012-08-30: 3 [IU] via SUBCUTANEOUS
  Administered 2012-08-30 – 2012-08-31 (×2): 15 [IU] via SUBCUTANEOUS

## 2012-08-29 MED ORDER — ENOXAPARIN SODIUM 40 MG/0.4ML ~~LOC~~ SOLN: 40.0000 mg | SUBCUTANEOUS | Status: DC

## 2012-08-29 MED ORDER — GABAPENTIN 300 MG PO CAPS
300.0000 mg | ORAL_CAPSULE | Freq: Every day | ORAL | Status: DC
  Administered 2012-08-29 – 2012-08-30 (×2): 300 mg via ORAL
  Filled 2012-08-29 (×3): qty 1

## 2012-08-29 MED ORDER — LATANOPROST 0.005 % OP SOLN
1.0000 [drp] | Freq: Every day | OPHTHALMIC | Status: DC
  Administered 2012-08-29 – 2012-08-30 (×2): 1 [drp] via OPHTHALMIC
  Filled 2012-08-29: qty 2.5

## 2012-08-29 MED ORDER — PHENOL 1.4 % MT LIQD: 1.0000 | OROMUCOSAL | Status: DC | PRN

## 2012-08-29 MED ORDER — METOLAZONE 5 MG PO TABS
5.0000 mg | ORAL_TABLET | Freq: Every day | ORAL | Status: DC | PRN
  Filled 2012-08-29: qty 1

## 2012-08-29 MED ORDER — HYDRALAZINE HCL 20 MG/ML IJ SOLN
10.0000 mg | INTRAMUSCULAR | Status: DC | PRN
  Filled 2012-08-29: qty 0.5

## 2012-08-29 MED ORDER — ACETAMINOPHEN 650 MG RE SUPP: 325.0000 mg | RECTAL | Status: DC | PRN

## 2012-08-29 MED ORDER — GUAIFENESIN-DM 100-10 MG/5ML PO SYRP: 15.0000 mL | ORAL_SOLUTION | ORAL | Status: DC | PRN

## 2012-08-29 MED ORDER — POTASSIUM CHLORIDE CRYS ER 20 MEQ PO TBCR
40.0000 meq | EXTENDED_RELEASE_TABLET | Freq: Two times a day (BID) | ORAL | Status: DC
  Administered 2012-08-29 – 2012-08-31 (×4): 40 meq via ORAL
  Filled 2012-08-29 (×5): qty 2

## 2012-08-29 MED ORDER — ASPIRIN EC 325 MG PO TBEC
325.0000 mg | DELAYED_RELEASE_TABLET | Freq: Every day | ORAL | Status: DC
  Administered 2012-08-29 – 2012-08-30 (×2): 325 mg via ORAL
  Filled 2012-08-29 (×4): qty 1

## 2012-08-29 MED ORDER — EZETIMIBE 10 MG PO TABS
10.0000 mg | ORAL_TABLET | Freq: Every day | ORAL | Status: DC
  Administered 2012-08-29 – 2012-08-31 (×3): 10 mg via ORAL
  Filled 2012-08-29 (×3): qty 1

## 2012-08-29 MED ORDER — ALUM & MAG HYDROXIDE-SIMETH 200-200-20 MG/5ML PO SUSP: 15.0000 mL | ORAL | Status: DC | PRN

## 2012-08-29 MED ORDER — MORPHINE SULFATE 2 MG/ML IJ SOLN
2.0000 mg | INTRAMUSCULAR | Status: DC | PRN
  Administered 2012-08-29: 2 mg via INTRAVENOUS
  Filled 2012-08-29 (×2): qty 1

## 2012-08-29 MED ORDER — HYDRALAZINE HCL 25 MG PO TABS
25.0000 mg | ORAL_TABLET | Freq: Three times a day (TID) | ORAL | Status: DC
  Administered 2012-08-29 – 2012-08-31 (×6): 25 mg via ORAL
  Filled 2012-08-29 (×8): qty 1

## 2012-08-29 MED ORDER — ISOSORBIDE DINITRATE 30 MG PO TABS
30.0000 mg | ORAL_TABLET | Freq: Two times a day (BID) | ORAL | Status: DC
  Administered 2012-08-29 – 2012-08-31 (×4): 30 mg via ORAL
  Filled 2012-08-29 (×5): qty 1

## 2012-08-29 MED ORDER — ONDANSETRON HCL 4 MG/2ML IJ SOLN
4.0000 mg | Freq: Once | INTRAMUSCULAR | Status: AC
  Administered 2012-08-29: 4 mg via INTRAVENOUS
  Filled 2012-08-29: qty 2

## 2012-08-29 MED ORDER — ACETAMINOPHEN 325 MG PO TABS: 325.0000 mg | ORAL_TABLET | ORAL | Status: DC | PRN

## 2012-08-29 MED ORDER — OXYCODONE-ACETAMINOPHEN 5-325 MG PO TABS
1.0000 | ORAL_TABLET | ORAL | Status: DC | PRN
  Administered 2012-08-29 – 2012-08-31 (×6): 2 via ORAL
  Filled 2012-08-29 (×6): qty 2

## 2012-08-29 MED ORDER — ACETAMINOPHEN 500 MG PO TABS: 1000.0000 mg | ORAL_TABLET | Freq: Four times a day (QID) | ORAL | Status: DC | PRN

## 2012-08-29 MED ORDER — FUROSEMIDE 80 MG PO TABS
160.0000 mg | ORAL_TABLET | Freq: Two times a day (BID) | ORAL | Status: DC
  Administered 2012-08-29 – 2012-08-31 (×4): 160 mg via ORAL
  Filled 2012-08-29 (×6): qty 2

## 2012-08-29 MED ORDER — LABETALOL HCL 100 MG PO TABS
100.0000 mg | ORAL_TABLET | Freq: Two times a day (BID) | ORAL | Status: DC
  Administered 2012-08-29 – 2012-08-31 (×4): 100 mg via ORAL
  Filled 2012-08-29 (×5): qty 1

## 2012-08-29 MED ORDER — KCL IN DEXTROSE-NACL 20-5-0.9 MEQ/L-%-% IV SOLN
INTRAVENOUS | Status: DC
  Administered 2012-08-29 – 2012-08-31 (×4): via INTRAVENOUS
  Filled 2012-08-29 (×5): qty 1000

## 2012-08-29 MED ORDER — ATORVASTATIN CALCIUM 80 MG PO TABS
80.0000 mg | ORAL_TABLET | Freq: Every day | ORAL | Status: DC
  Administered 2012-08-29 – 2012-08-30 (×2): 80 mg via ORAL
  Filled 2012-08-29 (×3): qty 1

## 2012-08-29 MED ORDER — HEPARIN (PORCINE) IN NACL 100-0.45 UNIT/ML-% IJ SOLN
1250.0000 [IU]/h | INTRAMUSCULAR | Status: DC
  Administered 2012-08-29: 1500 [IU]/h via INTRAVENOUS
  Administered 2012-08-30 – 2012-08-31 (×2): 1300 [IU]/h via INTRAVENOUS
  Filled 2012-08-29 (×5): qty 250

## 2012-08-29 MED ORDER — PANTOPRAZOLE SODIUM 40 MG PO TBEC
40.0000 mg | DELAYED_RELEASE_TABLET | Freq: Every day | ORAL | Status: DC
  Administered 2012-08-29 – 2012-08-30 (×2): 40 mg via ORAL
  Filled 2012-08-29 (×2): qty 1

## 2012-08-29 MED ORDER — HEPARIN BOLUS VIA INFUSION
2000.0000 [IU] | Freq: Once | INTRAVENOUS | Status: AC
  Administered 2012-08-29: 2000 [IU] via INTRAVENOUS

## 2012-08-29 MED ORDER — VITAMIN D3 25 MCG (1000 UNIT) PO TABS
1000.0000 [IU] | ORAL_TABLET | Freq: Every day | ORAL | Status: DC
  Administered 2012-08-29 – 2012-08-31 (×3): 1000 [IU] via ORAL
  Filled 2012-08-29 (×3): qty 1

## 2012-08-29 MED ORDER — NITROGLYCERIN 0.4 MG SL SUBL: 0.4000 mg | SUBLINGUAL_TABLET | SUBLINGUAL | Status: DC | PRN

## 2012-08-29 MED ORDER — LABETALOL HCL 5 MG/ML IV SOLN
10.0000 mg | INTRAVENOUS | Status: DC | PRN
  Filled 2012-08-29: qty 4

## 2012-08-29 NOTE — ED Notes (Signed)
Pt LLE discolored, weak pulse, faint yet audible with doppler. Swelling to BLE, tender to touch.

## 2012-08-29 NOTE — Progress Notes (Signed)
ANTICOAGULATION CONSULT NOTE - Follow Up Consult  Pharmacy Consult for Heparin Indication: Rule out LLE arterial thrombus  Allergies  Allergen Reactions  . Iohexol      Code: RASH, Desc: JENNIFER STATES ON PT'S CHART ALLERGIC TO IV DYE 09/04/07/RM, Onset Date: [id number]    Patient Measurements: Height: 5\' 6"  (167.6 cm) Weight: 212 lb 4.9 oz (96.3 kg) IBW/kg (Calculated) : 59.3   Vital Signs: Temp: 98.4 F (36.9 C) (03/15 1640) Temp src: Oral (03/15 1640) BP: 184/72 mmHg (03/15 1640) Pulse Rate: 90 (03/15 1640)  Labs:  Recent Labs  08/29/12 0900 08/29/12 1622  HGB 16.2*  --   HCT 49.2*  --   PLT 249  --   APTT 34  --   LABPROT 12.5  --   INR 0.94  --   HEPARINUNFRC  --  0.54  CREATININE 1.12*  --     Estimated Creatinine Clearance: 46.9 ml/min (by C-G formula based on Cr of 1.12).  Medical History: Past Medical History  Diagnosis Date  . Uterine cancer   . Diabetes mellitus     type II; peripheral neuropathy  . Hypertension   . GERD (gastroesophageal reflux disease)   . Peripheral vascular disease   . Obesity   . Dyslipidemia   . Diastolic CHF, chronic     EF 50-55%, mild LVH and grade 1 diast. Dysfxn  . Osteoarthritis cervical spine   . DM retinopathy   . Sleep apnea     with CPAP  . History of shingles   . COPD (chronic obstructive pulmonary disease)   . Depression   . Peptic ulcer disease     duodenal  . Peptic stricture of esophagus   . Hiatal hernia   . Diverticulosis   . Arthritis   . ASCVD (arteriosclerotic cardiovascular disease)     on MRI brin  . CAD (coronary artery disease)     a. s/p multiple caths with nonobs CAD;   b. cath 1/10: pLAD 20%, mLAD 40%, pCFX 20%, mCFX 40%, pRCA 60-70%;   c.  Myoview 06/02/12: Low anterior wall scar, no ischemia, EF 37%  . Cardiomyopathy     a. Echo 05/31/12: Severe LVH, EF 40-45%, diffuse HK, grade 1 diastolic dysfunction, mild to moderate calcified aortic valve annulus, mild to moderate aortic stenosis,  MAC, mild MR, moderate LAE  . Asthma     Mild  . Pruritic condition     Idiopathic  . Hyperlipidemia   . Zoster   . Noncompliance   . CKD (chronic kidney disease)     Dr. Eliott Nine   Medications:  Infusions:  . dextrose 5 % and 0.9 % NaCl with KCl 20 mEq/L 50 mL/hr at 08/29/12 1522  . heparin 1,500 Units/hr (08/29/12 1522)    Assessment: 20 yoF with recent diagnosis bone spur LLE, greatly increased pain, ruling out arterial compromise LLE  Heparin level therapeutic at 0.54 units/mL  Goal of Therapy:  Heparin level 0.3-0.7 units/ml Monitor platelets by anticoagulation protocol: Yes   Plan:  Cont heparin infusion 1500 units/hr Check heparin level in 8 hours to confirm   Talbert Cage, PharmD Pager [phone] 08/29/2012, 4:56 PM

## 2012-08-29 NOTE — Progress Notes (Signed)
Pt admitted to 2020 from ED with L lower extremity pain; pt alert and oriented; family at bedside; pt with history PVD, DM, HTN, hyperlipidemia, CHF, GERD; L femoral and L pop pulse via dopplar; no pulse L DP or PT; MD aware; Hep gtt infusing at 38ml/hr; pt denies pain at this time; will cont. To monitor.

## 2012-08-29 NOTE — ED Provider Notes (Signed)
History    79 year old female with atraumatic left foot pain. Patient attributes her pain to a bone spur. Onset yesterday and constant since. Pain is diffuse. Worse with movement and palpation. Denies significant pain anywhere. No numbness or tingling. No loss of strength. No acute back pain. No urinary complaints.   CSN: [id number]  Arrival date & time 08/29/12  8469   First MD Initiated Contact with Patient 08/29/12 0800      Chief Complaint  Patient presents with  . Foot Pain    bone spur to L foot     (Consider location/radiation/quality/duration/timing/severity/associated sxs/prior treatment) HPI  Past Medical History  Diagnosis Date  . Uterine cancer   . Diabetes mellitus     type II; peripheral neuropathy  . Hypertension   . GERD (gastroesophageal reflux disease)   . Peripheral vascular disease   . Obesity   . Dyslipidemia   . Diastolic CHF, chronic     EF 50-55%, mild LVH and grade 1 diast. Dysfxn  . Osteoarthritis cervical spine   . DM retinopathy   . Sleep apnea     with CPAP  . History of shingles   . COPD (chronic obstructive pulmonary disease)   . Depression   . Peptic ulcer disease     duodenal  . Peptic stricture of esophagus   . Hiatal hernia   . Diverticulosis   . Arthritis   . ASCVD (arteriosclerotic cardiovascular disease)     on MRI brin  . CAD (coronary artery disease)     a. s/p multiple caths with nonobs CAD;   b. cath 1/10: pLAD 20%, mLAD 40%, pCFX 20%, mCFX 40%, pRCA 60-70%;   c.  Myoview 06/02/12: Low anterior wall scar, no ischemia, EF 37%  . Cardiomyopathy     a. Echo 05/31/12: Severe LVH, EF 40-45%, diffuse HK, grade 1 diastolic dysfunction, mild to moderate calcified aortic valve annulus, mild to moderate aortic stenosis, MAC, mild MR, moderate LAE  . Asthma     Mild  . Pruritic condition     Idiopathic  . Hyperlipidemia   . Zoster   . Noncompliance   . CKD (chronic kidney disease)     Dr. Eliott Nine    Past Surgical History   Procedure Laterality Date  . Tubal ligation  1967  . Knee arthroscopy  10/1998    Left  . Craniotomy  1997    Left for SDH  . Cataract extraction, bilateral  2005  . Hernia repair    . Esophagogastroduodenoscopy  04/04/2004  . Spine surgery      C-spine and lumbar surgery  . Cholecystectomy  2010  . Cardiac catheterization    . Dexa  7/05  . Abdominal hysterectomy      Family History  Problem Relation Age of Onset  . Cancer Mother     "Stomach" Cancer  . Diabetes Mother   . Heart disease Mother   . Stomach cancer Mother   . Lymphoma Father   . Kidney disease Paternal Grandmother   . Asthma Other     History  Substance Use Topics  . Smoking status: Never Smoker   . Smokeless tobacco: Not on file  . Alcohol Use: No     Comment: rare    OB History   Grav Para Term Preterm Abortions TAB SAB Ect Mult Living                  Review of Systems  All systems reviewed and negative,  other than as noted in HPI.   Allergies  Iohexol  Home Medications   Current Outpatient Rx  Name  Route  Sig  Dispense  Refill  . acetaminophen (TYLENOL) 500 MG tablet   Oral   Take 1,000 mg by mouth every 6 (six) hours as needed. For pain         . aspirin EC 325 MG tablet   Oral   Take 325 mg by mouth daily.         . cholecalciferol (VITAMIN D) 1000 UNITS tablet   Oral   Take 1,000 Units by mouth daily.         Marland Kitchen ezetimibe (ZETIA) 10 MG tablet   Oral   Take 10 mg by mouth daily.          . furosemide (LASIX) 80 MG tablet   Oral   Take 160 mg by mouth 2 (two) times daily.         Marland Kitchen gabapentin (NEURONTIN) 300 MG capsule   Oral   Take 300 mg by mouth at bedtime.         . hydrALAZINE (APRESOLINE) 25 MG tablet   Oral   Take 25 mg by mouth 3 (three) times daily.         . insulin NPH-insulin regular (NOVOLIN 70/30) (70-30) 100 UNIT/ML injection   Subcutaneous   Inject 80 Units into the skin daily with breakfast.         . isosorbide dinitrate  (ISORDIL) 30 MG tablet   Oral   Take 30 mg by mouth 2 (two) times daily.          Marland Kitchen labetalol (NORMODYNE) 200 MG tablet   Oral   Take 100 mg by mouth 2 (two) times daily.          Marland Kitchen latanoprost (XALATAN) 0.005 % ophthalmic solution   Both Eyes   Place 1 drop into both eyes at bedtime.         . metolazone (ZAROXOLYN) 5 MG tablet   Oral   Take 1 tablet (5 mg total) by mouth daily as needed. Daily as needed for weight gain over 221 pounds   30 tablet   3   . nitroGLYCERIN (NITROSTAT) 0.4 MG SL tablet   Sublingual   Place 0.4 mg under the tongue every 5 (five) minutes as needed for chest pain.          . potassium chloride SA (K-DUR,KLOR-CON) 20 MEQ tablet   Oral   Take 40 mEq by mouth 2 (two) times daily.         . rosuvastatin (CRESTOR) 40 MG tablet   Oral   Take 40 mg by mouth every morning.            BP 170/77  Pulse 101  Temp(Src) 98.5 F (36.9 C)  Resp 22  SpO2 97%  Physical Exam  Nursing note and vitals reviewed. Constitutional: She appears well-developed and well-nourished. No distress.  HENT:  Head: Normocephalic and atraumatic.  Eyes: Conjunctivae are normal. Right eye exhibits no discharge. Left eye exhibits no discharge.  Neck: Neck supple.  Cardiovascular: Normal rate, regular rhythm and normal heart sounds.  Exam reveals no gallop and no friction rub.   No murmur heard. Pulmonary/Chest: Effort normal and breath sounds normal. No respiratory distress.  Abdominal: Soft. She exhibits no distension. There is no tenderness.  Musculoskeletal: She exhibits edema and tenderness.  L foot and distal shin cool to touch as compared  to R. Diffuse tenderness and slight darkening of skin tone in same distribution. I could not palpate a DP pulse in either foot. I could doppler a pulse in R foot, but not left. Good palpable femoral pulses b/l.   Neurological: She is alert.  Skin: Skin is warm and dry.  Psychiatric: She has a normal mood and affect. Her  behavior is normal. Thought content normal.    ED Course  Procedures (including critical care time)  Labs Reviewed  CBC  BASIC METABOLIC PANEL  APTT  PROTIME-INR  HEPARIN LEVEL (UNFRACTIONATED)   No results found.  EKG:  Rhythm: normal sinus Rate: 76 Axis: normal Intervals: normal ST segments: NS ST changes. TWI lateral precordial leads and inferiorly Comparison: TWI in precordial leads noted on previous EKG from 06/2012   1. Ischemic pain of foot, left     Will  MDM  82yF with atraumatic L foot pain. Foot/distal leg cool to touch. I could not palpate or doppler a pulse. Heparin. Labs. Vascular surgery consult.   8:37 AM Paged Dr Edilia Bo, vascular surgery. Currently in OR. Surgical PA to eval. Heparin ordered. Unfortunately iohexol allergy precluding CT angiography.   8:43 AM Called back. Requesting pt transfer to [facility].       Raeford Razor, MD 08/29/12 1005

## 2012-08-29 NOTE — ED Notes (Signed)
Family at bedside. 

## 2012-08-29 NOTE — Progress Notes (Addendum)
VASCULAR LAB PRELIMINARY  ARTERIAL  ABI completed:    RIGHT    LEFT    PRESSURE WAVEFORM  PRESSURE WAVEFORM  BRACHIAL 159 Triphasic BRACHIAL 188 Triphasic  DP 123 Triphasic DP  Unable to insonate.  AT   AT 26 Severely dampened monophasic  PT 94 Monophasic PT  Unable to insonate.  PER   PER    GREAT TOE  NA GREAT TOE Unable to obtain. NA    RIGHT LEFT  ABI 0.65 0.14   The right ABI is suggestive of moderate arterial insufficiency. The left ABI is suggestive of severe arterial insufficiency. Unable to insonate the left dorsalis pedis or posterior tibial arteries. Attempted to obtain PPG great toe waveform and pressure, however was unable to detect a definitive waveform. The left brachial artery pressure is significantly higher than the right, this may be due to patient being in excessive pain after pressure of the left lower extremity was obtained, however a steal syndrome cannot be excluded. Preliminary results discussed with Dr.Dickson.  08/29/2012 1:43 PM Gertie Fey, RDMS, RDCS

## 2012-08-29 NOTE — Progress Notes (Signed)
Pt with 14 beats SVT rate 130; pt asymptomatic; HR down to SR 80's immediately following; will place strip on char and cont. To monitor.

## 2012-08-29 NOTE — ED Notes (Signed)
Per EMS pt states she was dx with a L foot bone spur and the pain is causing her to have shortness of breath. Pt has hx of CHF, DM. Her blood sugar in route was 163, BP 153/70, HR 78, T 98.9.

## 2012-08-29 NOTE — ED Notes (Signed)
Report given to [place], Charity fundraiser. Pt transported to POD C (holding for admission).

## 2012-08-29 NOTE — H&P (Signed)
[facility] [place]  Patient name: [name] MRN: [id number] DOB: April 21, 1930 Sex: female  REASON FOR ADMISSION: ischemic left foot  HPI: [name] is a 79 y.o. female states that she has had pain in both feet for many months. She describes constant pain and also bilateral lower extremity thigh claudication. The pain especially in the left foot, has gradually worsened. She presented to the [facility] emergency department and was found to have a cold left foot. [facility] was transferred [facility] for vascular evaluation. The patient does not describe any sudden increase in pain in the foot but notes that the symptoms have gradually increased. She does describe rest pain in both feet more significantly on the left side. She denies any history of nonhealing wounds. He denies any history of arrhythmias.  Her risk factors for peripheral vascular disease include diabetes, hypertension, hypercholesterolemia. She denies tobacco use and is unaware of any history of premature cardiovascular disease.  She is ambulatory and lives with her husband.  Past Medical History  Diagnosis Date  . Uterine cancer   . Diabetes mellitus     type II; peripheral neuropathy  . Hypertension   . GERD (gastroesophageal reflux disease)   . Peripheral vascular disease   . Obesity   . Dyslipidemia   . Diastolic CHF, chronic     EF 50-55%, mild LVH and grade 1 diast. Dysfxn  . Osteoarthritis cervical spine   . DM retinopathy   . Sleep apnea     with CPAP  . History of shingles   . COPD (chronic obstructive pulmonary disease)   . Depression   . Peptic ulcer disease     duodenal  . Peptic stricture of esophagus   . Hiatal hernia   . Diverticulosis   . Arthritis   . ASCVD (arteriosclerotic cardiovascular disease)     on MRI brin  . CAD (coronary artery disease)     a. s/p multiple caths with nonobs CAD;   b. cath 1/10: pLAD 20%, mLAD 40%, pCFX 20%, mCFX 40%, pRCA 60-70%;   c.   Myoview 06/02/12: Low anterior wall scar, no ischemia, EF 37%  . Cardiomyopathy     a. Echo 05/31/12: Severe LVH, EF 40-45%, diffuse HK, grade 1 diastolic dysfunction, mild to moderate calcified aortic valve annulus, mild to moderate aortic stenosis, MAC, mild MR, moderate LAE  . Asthma     Mild  . Pruritic condition     Idiopathic  . Hyperlipidemia   . Zoster   . Noncompliance   . CKD (chronic kidney disease)     Dr. Eliott Nine    Family History  Problem Relation Age of Onset  . Cancer Mother     "Stomach" Cancer  . Diabetes Mother   . Heart disease Mother   . Stomach cancer Mother   . Lymphoma Father   . Kidney disease Paternal Grandmother   . Asthma Other     SOCIAL HISTORY: History  Substance Use Topics  . Smoking status: Never Smoker   . Smokeless tobacco: Not on file  . Alcohol Use: No     Comment: rare  she is married and has 6 living children.  Allergies  Allergen Reactions  . Iohexol      Code: RASH, Desc: JENNIFER STATES ON PT'S CHART ALLERGIC TO IV DYE 09/04/07/RM, Onset Date: [id number]   she had hives after IV contrast when she had her heart cath many years ago.  Current Facility-Administered Medications  Medication Dose Route Frequency Provider Last Rate Last Dose  . heparin ADULT infusion 100 units/mL (25000 units/250 mL)  1,500 Units/hr Intravenous Continuous Otho Bellows, RPH 15 mL/hr at 08/29/12 0935 1,500 Units/hr at 08/29/12 0935   Current Outpatient Prescriptions  Medication Sig Dispense Refill  . acetaminophen (TYLENOL) 500 MG tablet Take 1,000 mg by mouth every 6 (six) hours as needed. For pain      . aspirin EC 325 MG tablet Take 325 mg by mouth daily.      . cholecalciferol (VITAMIN D) 1000 UNITS tablet Take 1,000 Units by mouth daily.      Marland Kitchen ezetimibe (ZETIA) 10 MG tablet Take 10 mg by mouth daily.       . furosemide (LASIX) 80 MG tablet Take 160 mg by mouth 2 (two) times daily.      Marland Kitchen gabapentin (NEURONTIN) 300 MG capsule Take 300 mg by mouth  at bedtime.      . hydrALAZINE (APRESOLINE) 25 MG tablet Take 25 mg by mouth 3 (three) times daily.      . insulin NPH-insulin regular (NOVOLIN 70/30) (70-30) 100 UNIT/ML injection Inject 80 Units into the skin daily with breakfast.      . isosorbide dinitrate (ISORDIL) 30 MG tablet Take 30 mg by mouth 2 (two) times daily.       Marland Kitchen labetalol (NORMODYNE) 200 MG tablet Take 100 mg by mouth 2 (two) times daily.       Marland Kitchen latanoprost (XALATAN) 0.005 % ophthalmic solution Place 1 drop into both eyes at bedtime.      . metolazone (ZAROXOLYN) 5 MG tablet Take 1 tablet (5 mg total) by mouth daily as needed. Daily as needed for weight gain over 221 pounds  30 tablet  3  . nitroGLYCERIN (NITROSTAT) 0.4 MG SL tablet Place 0.4 mg under the tongue every 5 (five) minutes as needed for chest pain.       . potassium chloride SA (K-DUR,KLOR-CON) 20 MEQ tablet Take 40 mEq by mouth 2 (two) times daily.      . rosuvastatin (CRESTOR) 40 MG tablet Take 40 mg by mouth every morning.         REVIEW OF SYSTEMS: Arly.Keller ] denotes positive finding; [  ] denotes negative finding CARDIOVASCULAR:  [ ]  chest pain   Arly.Keller ] chest pressure- she states that this is chronic and has not changed in the last year.   [ ]  palpitations   Arly.Keller ] orthopnea  Arly.Keller ] dyspnea on exertion   Arly.Keller ] claudication   Arly.Keller ] rest pain   [ ]  DVT   [ ]  phlebitis PULMONARY:   [ ]  productive cough   [ ]  asthma   Arly.Keller ] wheezing NEUROLOGIC:   [ ]  weakness  [ ]  paresthesias  [ ]  aphasia  [ ]  amaurosis  Arly.Keller ] dizziness HEMATOLOGIC:   [ ]  bleeding problems   [ ]  clotting disorders MUSCULOSKELETAL:  Arly.Keller ] joint pain   [ ]  joint swelling [ ]  leg swelling GASTROINTESTINAL: [ ]   blood in stool  [ ]   hematemesis GENITOURINARY:  [ ]   dysuria  [ ]   hematuria PSYCHIATRIC:  Arly.Keller ] history of major depression INTEGUMENTARY:  [ ]  rashes  [ ]  ulcers CONSTITUTIONAL:  [ ]  fever   [ ]  chills  PHYSICAL EXAM: Filed Vitals:   08/29/12 1005 08/29/12 1049 08/29/12 1052 08/29/12 1118  BP: 168/76   153/77   Pulse: 86     Temp:  98.5 F (36.9 C)   99.2 F (37.3 C)  TempSrc: Oral   Oral  Resp: 20  19   Weight:  212 lb (96.163 kg)    SpO2: 99%  100%    Body mass index is 34.23 kg/(m^2). GENERAL: The patient is a well-nourished female, in no acute distress. The vital signs are documented above. CARDIOVASCULAR: There is a regular rate and rhythm. I do not detect carotid bruits. She has a palpable right femoral pulse. He has a palpable left femoral pulse although slightly diminished. She has palpable popliteal pulses bilaterally. On the right side she has a fairly brisk peroneal dorsalis pedis and posterior tibial signal. On the left side she has a monophasic anterior tibial signal with the Doppler. I cannot obtain a dorsalis pedis, posterior tibial, or peroneal signal. The left foot is cooler than the right. She has slightly decreased motor function on the left. She states that sensation is intact. PULMONARY: There is good air exchange bilaterally without wheezing or rales. ABDOMEN: Soft and non-tender with normal pitched bowel sounds. I am unable to palpate an aneurysm although she is obese and it is difficult to assess. MUSCULOSKELETAL: There are no major deformities. NEUROLOGIC: she has mild weakness in the left foot. SKIN: There are no ulcers or rashes noted. PSYCHIATRIC: The patient has a normal affect.  DATA:  Lab Results  Component Value Date   WBC 12.9* 08/29/2012   HGB 16.2* 08/29/2012   HCT 49.2* 08/29/2012   MCV 87.9 08/29/2012   PLT 249 08/29/2012   Lab Results  Component Value Date   NA 133* 08/29/2012   K 4.1 08/29/2012   CL 94* 08/29/2012   CO2 25 08/29/2012   Lab Results  Component Value Date   CREATININE 1.12* 08/29/2012   Lab Results  Component Value Date   INR 0.94 08/29/2012   INR 1.03 05/30/2012   INR 1.0 11/15/2008   Lab Results  Component Value Date   HGBA1C 10.7* 05/30/2012   Carotid duplex scan on 07/18/2011 showed a 40-59% right carotid stenosis with no  significant stenosis on the left. CXR in December of 2013 showed stable cardiomegaly.  MEDICAL ISSUES:  ISCHEMIC LEFT FOOT: Based on this patient's exam. 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 She has a palpable femoral and popliteal pulse on the left with an anterior signal with the Doppler only on the left. The left foot is cool. She has no reason to have had an acute embolic event. There is no history of atrial fibrillation or recent cardiac event. I suspect that she has had gradual progression of her tibial artery occlusive disease which explains her rest pain and ischemia. The left foot is cool and clearly this is a limb threatening situation. She will need arteriography to further delineate her anatomy before determining if there are any options for revascularization. She does have a history of a dye allergy and she will be premedicated. We'll plan on proceeding with arteriography on Monday. Kidney function is normal. If she were to be a candidate for revascularization she could potentially require preoperative cardiac evaluation. Her primary cardiologist according to the patient is Dr. Juanito Doom. I will also order ABIs  DIABETES: Her hemoglobin A1c is significantly elevated at 10.7. We will follow her CBGs closely. If this is not well controlled during her hospitalization we will request consult from the hospitalist.  CHRONIC COMBINED SYSTOLIC AND DIASTOLIC HEART FAILURE: This appears to be stable   Eldean Klatt S [facility] [place] Beeper: [phone]

## 2012-08-29 NOTE — ED Notes (Signed)
79 yr old female EMS this AM c/o LT left foot pain and shortness of breath.  Per medic Lt foot purple and cold. FSBS 163. Pt was transfer from [facility] hospital.

## 2012-08-29 NOTE — ED Notes (Signed)
Dr. Edilia Bo at the bedside to evaluate patient.

## 2012-08-29 NOTE — ED Notes (Signed)
ZOX:[id number]<AV> Expected date:<BR> Expected time:<BR> Means of arrival:<BR> Comments:<BR> Bone spur pain

## 2012-08-29 NOTE — Progress Notes (Signed)
ANTICOAGULATION CONSULT NOTE - Initial Consult  Pharmacy Consult for Heparin Indication: Rule out LLE arterial thrombus  Allergies  Allergen Reactions  . Iohexol      Code: RASH, Desc: JENNIFER STATES ON PT'S CHART ALLERGIC TO IV DYE 09/04/07/RM, Onset Date: [id number]    Patient Measurements:   TBW 98kg 1/21: requested current weight as can be obtained.  Vital Signs: Temp: 98.5 F (36.9 C) (03/15 0745) BP: 170/77 mmHg (03/15 0745) Pulse Rate: 101 (03/15 0745)  Labs: No results found for this basename: HGB, HCT, PLT, APTT, LABPROT, INR, HEPARINUNFRC, CREATININE, CKTOTAL, CKMB, TROPONINI,  in the last 72 hours  The CrCl is unknown because both a height and weight (above a minimum accepted value) are required for this calculation.  Medical History: Past Medical History  Diagnosis Date  . Uterine cancer   . Diabetes mellitus     type II; peripheral neuropathy  . Hypertension   . GERD (gastroesophageal reflux disease)   . Peripheral vascular disease   . Obesity   . Dyslipidemia   . Diastolic CHF, chronic     EF 50-55%, mild LVH and grade 1 diast. Dysfxn  . Osteoarthritis cervical spine   . DM retinopathy   . Sleep apnea     with CPAP  . History of shingles   . COPD (chronic obstructive pulmonary disease)   . Depression   . Peptic ulcer disease     duodenal  . Peptic stricture of esophagus   . Hiatal hernia   . Diverticulosis   . Arthritis   . ASCVD (arteriosclerotic cardiovascular disease)     on MRI brin  . CAD (coronary artery disease)     a. s/p multiple caths with nonobs CAD;   b. cath 1/10: pLAD 20%, mLAD 40%, pCFX 20%, mCFX 40%, pRCA 60-70%;   c.  Myoview 06/02/12: Low anterior wall scar, no ischemia, EF 37%  . Cardiomyopathy     a. Echo 05/31/12: Severe LVH, EF 40-45%, diffuse HK, grade 1 diastolic dysfunction, mild to moderate calcified aortic valve annulus, mild to moderate aortic stenosis, MAC, mild MR, moderate LAE  . Asthma     Mild  . Pruritic  condition     Idiopathic  . Hyperlipidemia   . Zoster   . Noncompliance   . CKD (chronic kidney disease)     Dr. Eliott Nine   Medications:  Infusions:  . heparin    . heparin      Assessment: 57 yoF with recent diagnosis bone spur LLE, greatly increased pain, ruling out arterial compromise LLE  Baseline coags ordered,obtain before Heparin begun  SCr elevated last labs 1/14, Cl < 50 ml/min  Goal of Therapy:  Heparin level 0.3-0.7 units/ml Monitor platelets by anticoagulation protocol: Yes   Plan:  Heparin bolus 2000 units Heparin infusion 1500 units/hr Check 1st heparin level in 8 hrs, then daily Daily CBC  Otho Bellows PharmD Pager [phone] 08/29/2012, 8:46 AM

## 2012-08-30 DIAGNOSIS — I999 Unspecified disorder of circulatory system: Secondary | ICD-10-CM

## 2012-08-30 DIAGNOSIS — J449 Chronic obstructive pulmonary disease, unspecified: Secondary | ICD-10-CM

## 2012-08-30 DIAGNOSIS — I5042 Chronic combined systolic (congestive) and diastolic (congestive) heart failure: Secondary | ICD-10-CM

## 2012-08-30 DIAGNOSIS — M79609 Pain in unspecified limb: Secondary | ICD-10-CM

## 2012-08-30 DIAGNOSIS — I2510 Atherosclerotic heart disease of native coronary artery without angina pectoris: Secondary | ICD-10-CM

## 2012-08-30 DIAGNOSIS — I998 Other disorder of circulatory system: Secondary | ICD-10-CM | POA: Diagnosis present

## 2012-08-30 LAB — CBC
HCT: 40 % (ref 36.0–46.0)
Platelets: 210 10*3/uL (ref 150–400)
RDW: 14.4 % (ref 11.5–15.5)
WBC: 11.4 10*3/uL — ABNORMAL HIGH (ref 4.0–10.5)

## 2012-08-30 LAB — HEPARIN LEVEL (UNFRACTIONATED): Heparin Unfractionated: 0.22 IU/mL — ABNORMAL LOW (ref 0.30–0.70)

## 2012-08-30 LAB — GLUCOSE, CAPILLARY: Glucose-Capillary: 281 mg/dL — ABNORMAL HIGH (ref 70–99)

## 2012-08-30 MED ORDER — METHYLPREDNISOLONE SODIUM SUCC 125 MG IJ SOLR
125.0000 mg | INTRAMUSCULAR | Status: AC
  Administered 2012-08-31: 125 mg via INTRAVENOUS
  Filled 2012-08-30 (×2): qty 2

## 2012-08-30 MED ORDER — ALBUTEROL SULFATE (5 MG/ML) 0.5% IN NEBU
2.5000 mg | INHALATION_SOLUTION | Freq: Three times a day (TID) | RESPIRATORY_TRACT | Status: DC
  Administered 2012-08-30 – 2012-09-01 (×5): 2.5 mg via RESPIRATORY_TRACT
  Filled 2012-08-30 (×6): qty 0.5

## 2012-08-30 MED ORDER — INSULIN GLARGINE 100 UNIT/ML ~~LOC~~ SOLN
25.0000 [IU] | Freq: Every day | SUBCUTANEOUS | Status: DC
  Administered 2012-08-30: 25 [IU] via SUBCUTANEOUS

## 2012-08-30 MED ORDER — FAMOTIDINE IN NACL 20-0.9 MG/50ML-% IV SOLN
20.0000 mg | INTRAVENOUS | Status: AC
  Administered 2012-08-31: 20 mg via INTRAVENOUS
  Filled 2012-08-30 (×2): qty 50

## 2012-08-30 MED ORDER — DIPHENHYDRAMINE HCL 50 MG/ML IJ SOLN: 25.0000 mg | INTRAMUSCULAR | Status: DC

## 2012-08-30 MED ORDER — FAMOTIDINE IN NACL 20-0.9 MG/50ML-% IV SOLN
20.0000 mg | INTRAVENOUS | Status: DC
  Filled 2012-08-30: qty 50

## 2012-08-30 MED ORDER — IPRATROPIUM BROMIDE 0.02 % IN SOLN
0.5000 mg | Freq: Three times a day (TID) | RESPIRATORY_TRACT | Status: DC
  Administered 2012-08-30 – 2012-09-01 (×5): 0.5 mg via RESPIRATORY_TRACT
  Filled 2012-08-30 (×6): qty 2.5

## 2012-08-30 MED ORDER — METHYLPREDNISOLONE SODIUM SUCC 125 MG IJ SOLR
125.0000 mg | INTRAMUSCULAR | Status: DC
  Filled 2012-08-30: qty 2

## 2012-08-30 MED ORDER — DIPHENHYDRAMINE HCL 50 MG/ML IJ SOLN
25.0000 mg | INTRAMUSCULAR | Status: AC
  Administered 2012-08-31: 25 mg via INTRAVENOUS
  Filled 2012-08-30: qty 1

## 2012-08-30 NOTE — Progress Notes (Signed)
ANTICOAGULATION CONSULT NOTE - Follow Up Consult  Pharmacy Consult for heparin Indication: r/o LLE arterial thrombus  Labs:  Recent Labs  08/29/12 0900 08/29/12 1622 08/30/12 0057 08/30/12 0927  HGB 16.2*  --  13.2  --   HCT 49.2*  --  40.0  --   PLT 249  --  210  --   APTT 34  --   --   --   LABPROT 12.5  --   --   --   INR 0.94  --   --   --   HEPARINUNFRC  --  0.54 0.85* 0.76*  CREATININE 1.12*  --   --   --     Assessment: 79yo female remains supratherapeutic on heparin.    Goal of Therapy:  Heparin level 0.3-0.7 units/ml   Plan:  Will decrease heparin gtt to 1200 units/hr Check HL in 8 hours  Talbert Cage  PharmD 08/30/2012,11:10 AM

## 2012-08-30 NOTE — Progress Notes (Signed)
Utilization Review Completed.[name] T3/16/2014  

## 2012-08-30 NOTE — Consult Note (Addendum)
[facility]  PATIENT DETAILS Name: [name] Age: 79 y.o. Sex: female Date of Birth: 1929/08/31 Admit Date: 08/29/2012 [id number], Gregary Signs, MD Requesting MD: Chuck Hint, MD  Date of consultation: 08/30/12  REASON FOR CONSULTATION:  Management of medical comorbidities  Impression Principal Problem:   Ischemic foot Active Problems:   DM (diabetes mellitus), type 2, uncontrolled, with renal complications   HYPERLIPIDEMIA   HYPERTENSION   CORONARY ARTERY DISEASE   Chronic combined systolic and diastolic heart failure   PERIPHERAL VASCULAR DISEASE   COPD   GERD   OSA  Recommendations 1. Ischemic foot: Agree with heparin infusion, for angiogram tomorrow. Rest per primary service 2. Chronic combined systolic and diastolic heart failure: Appears clinically compensated, lungs are clear, she does not have any significant peripheral edema. 2-D echocardiogram on 05/31/12 showed EF around 40-45% with diffuse hypokinesis. Continue with 180 mg of Lasix twice a day and 5 mg of metolazone as needed. She currently looks euvolemic to me. Weight have been stable since admission. Continue with labetalol, Imdur and hydralazine. 3. COPD: Lungs are currently clear. We'll start some nebulized bronchodilators via inpatient-particularly if she is going to need vascular surgery or amputation. Consider incentive spirometry pre-and postop as well. 4. OSA: Apparently is on CPAP at home-wiIl order 5. Diabetes: Currently on SSI-sugars mostly uncontrolled. Will start Lantus while inpatient. Adjust accordingly. 6. Peripheral neuropathy- continue with Neurontin 7. Hypertension- currently controlled-continue with current regimen 8. History of CAD- no chest pain or shortness of breath. Continue with aspirin, statin and beta blockers. 9. GERD- continue PPI 10.  Preop assessment - doubt she needs further workup prior to any further surgical procedures, however if she were to  require bypass-then we probably will need cardiology for preop assessment as well   DVT Prophylaxis: - Not needed as on heparin infusion  Code Status: - Full code Hospitalist service will followup again tomorrow. Please contact me if I can be of assistance in the meanwhile. Thank you for this consultation.  [facility] Pager [phone]  HPI: Patient is a 79 year old African American female with significant past medical history of diabetes, hypertension, known peripheral as the disease, known coronary artery disease, combined systolic and diastolic dysfunction who presented with worsening pain in her left lower extremity. In the ED she was found to have a cold left foot, she was seen then by vascular surgery and admitted to their service. Hospitalist service was asked to evaluate the patient and manage her medical comorbidities. During my evaluation, patient was lying comfortably in bed and appeared in no distress. She had no major complaints except for pain in her left foot area.   ALLERGIES:   Allergies  Allergen Reactions  . Iohexol      Code: RASH, Desc: JENNIFER STATES ON PT'S CHART ALLERGIC TO IV DYE 09/04/07/RM, Onset Date: [id number]     PAST MEDICAL HISTORY: Past Medical History  Diagnosis Date  . Uterine cancer   . Diabetes mellitus     type II; peripheral neuropathy  . Hypertension   . GERD (gastroesophageal reflux disease)   . Peripheral vascular disease   . Obesity   . Dyslipidemia   . Diastolic CHF, chronic     EF 50-55%, mild LVH and grade 1 diast. Dysfxn  . Osteoarthritis cervical spine   . DM retinopathy   . Sleep apnea     with CPAP  . History of shingles   . COPD (chronic obstructive pulmonary disease)   . Depression   .  Peptic ulcer disease     duodenal  . Peptic stricture of esophagus   . Hiatal hernia   . Diverticulosis   . Arthritis   . ASCVD (arteriosclerotic cardiovascular disease)     on MRI brin  . CAD (coronary artery  disease)     a. s/p multiple caths with nonobs CAD;   b. cath 1/10: pLAD 20%, mLAD 40%, pCFX 20%, mCFX 40%, pRCA 60-70%;   c.  Myoview 06/02/12: Low anterior wall scar, no ischemia, EF 37%  . Cardiomyopathy     a. Echo 05/31/12: Severe LVH, EF 40-45%, diffuse HK, grade 1 diastolic dysfunction, mild to moderate calcified aortic valve annulus, mild to moderate aortic stenosis, MAC, mild MR, moderate LAE  . Asthma     Mild  . Pruritic condition     Idiopathic  . Hyperlipidemia   . Zoster   . Noncompliance   . CKD (chronic kidney disease)     Dr. Eliott Nine    PAST SURGICAL HISTORY: Past Surgical History  Procedure Laterality Date  . Tubal ligation  1967  . Knee arthroscopy  10/1998    Left  . Craniotomy  1997    Left for SDH  . Cataract extraction, bilateral  2005  . Hernia repair    . Esophagogastroduodenoscopy  04/04/2004  . Spine surgery      C-spine and lumbar surgery  . Cholecystectomy  2010  . Cardiac catheterization    . Dexa  7/05  . Abdominal hysterectomy      MEDICATIONS AT HOME: Prior to Admission medications   Medication Sig Start Date End Date Taking? Authorizing Provider  acetaminophen (TYLENOL) 500 MG tablet Take 1,000 mg by mouth every 6 (six) hours as needed. For pain   Yes Historical Provider, MD  aspirin EC 325 MG tablet Take 325 mg by mouth daily.   Yes Historical Provider, MD  cholecalciferol (VITAMIN D) 1000 UNITS tablet Take 1,000 Units by mouth daily.   Yes Historical Provider, MD  ezetimibe (ZETIA) 10 MG tablet Take 10 mg by mouth daily.    Yes Historical Provider, MD  furosemide (LASIX) 80 MG tablet Take 160 mg by mouth 2 (two) times daily. 04/19/11  Yes Gaylord Shih, MD  gabapentin (NEURONTIN) 300 MG capsule Take 300 mg by mouth at bedtime.   Yes Historical Provider, MD  hydrALAZINE (APRESOLINE) 25 MG tablet Take 25 mg by mouth 3 (three) times daily.   Yes Historical Provider, MD  insulin NPH-insulin regular (NOVOLIN 70/30) (70-30) 100 UNIT/ML injection  Inject 80 Units into the skin daily with breakfast. 05/21/12  Yes Romero Belling, MD  isosorbide dinitrate (ISORDIL) 30 MG tablet Take 30 mg by mouth 2 (two) times daily.  04/12/11  Yes Gaylord Shih, MD  labetalol (NORMODYNE) 200 MG tablet Take 100 mg by mouth 2 (two) times daily.    Yes Romero Belling, MD  latanoprost (XALATAN) 0.005 % ophthalmic solution Place 1 drop into both eyes at bedtime.   Yes Historical Provider, MD  metolazone (ZAROXOLYN) 5 MG tablet Take 1 tablet (5 mg total) by mouth daily as needed. Daily as needed for weight gain over 221 pounds 05/25/12  Yes Gaylord Shih, MD  nitroGLYCERIN (NITROSTAT) 0.4 MG SL tablet Place 0.4 mg under the tongue every 5 (five) minutes as needed for chest pain.    Yes Historical Provider, MD  potassium chloride SA (K-DUR,KLOR-CON) 20 MEQ tablet Take 40 mEq by mouth 2 (two) times daily. 06/16/12  Yes Scott T  Weaver, PA-C  rosuvastatin (CRESTOR) 40 MG tablet Take 40 mg by mouth every morning.    Yes Historical Provider, MD    FAMILY HISTORY: Family History  Problem Relation Age of Onset  . Cancer Mother     "Stomach" Cancer  . Diabetes Mother   . Heart disease Mother   . Stomach cancer Mother   . Lymphoma Father   . Kidney disease Paternal Grandmother   . Asthma Other     SOCIAL HISTORY:  reports that she has never smoked. She does not have any smokeless tobacco history on file. She reports that she does not drink alcohol or use illicit drugs.  REVIEW OF SYSTEMS:  Constitutional:   No  weight loss, night sweats,  Fevers, chills, fatigue.  HEENT:    No headaches, Difficulty swallowing,Tooth/dental problems,Sore throat,  No sneezing, itching, ear ache, nasal congestion, post nasal drip,   Cardio-vascular: No chest pain,  Orthopnea, PND, swelling in lower extremities, anasarca, dizziness, palpitations  GI:  No heartburn, indigestion, abdominal pain, nausea, vomiting, diarrhea, change in       bowel habits, loss of appetite  Resp: No  shortness of breath with exertion or at rest.  No excess mucus, no productive cough, No non-productive cough,  No coughing up of blood.No change in color of mucus.No wheezing.No chest wall deformity  Skin:  no rash or lesions.  GU:  no dysuria, change in color of urine, no urgency or frequency.  No flank pain.  Musculoskeletal: No joint pain or swelling.  No decreased range of motion.  No back pain.  Psych: No change in mood or affect. No depression or anxiety.  No memory loss.   PHYSICAL EXAM: Blood pressure 115/50, pulse 79, temperature 98 F (36.7 C), temperature source Oral, resp. rate 18, height 5\' 6"  (1.676 m), weight 96.3 kg (212 lb 4.9 oz), SpO2 100.00%.  General appearance :Awake, alert, not in any distress. Speech Clear. Not toxic Looking HEENT: Atraumatic and Normocephalic, pupils equally reactive to light and accomodation Neck: supple, no JVD. No cervical lymphadenopathy.  Chest:Good air entry bilaterally, no added sounds  CVS: S1 S2 regular, no murmurs.  Abdomen: Bowel sounds present, Non tender and not distended with no gaurding, rigidity or rebound. Extremities: B/L Lower Ext shows no edema, right lower extremity is warm to touch, left lower extremity is cold to touch from her left lower third of the leg Neurology: Awake alert, and oriented X 3, CN II-XII intact, Non focal Skin:No Rash Wounds:N/A  LABS ON ADMISSION:   Recent Labs  08/29/12 0900  NA 133*  K 4.1  CL 94*  CO2 25  GLUCOSE 143*  BUN 18  CREATININE 1.12*  CALCIUM 9.5   No results found for this basename: AST, ALT, ALKPHOS, BILITOT, PROT, ALBUMIN,  in the last 72 hours No results found for this basename: LIPASE, AMYLASE,  in the last 72 hours  Recent Labs  08/29/12 0900 08/30/12 0057  WBC 12.9* 11.4*  HGB 16.2* 13.2  HCT 49.2* 40.0  MCV 87.9 87.9  PLT 249 210   No results found for this basename: CKTOTAL, CKMB, CKMBINDEX, TROPONINI,  in the last 72 hours No results found for this  basename: DDIMER,  in the last 72 hours No components found with this basename: POCBNP,    RADIOLOGIC STUDIES ON ADMISSION: No results found.   Total time spent 45 minutes.  [facility] Pager [phone]  If 7PM-7AM, please contact night-coverage [email or web address] Password [facility] 08/30/2012, 11:43 AM

## 2012-08-30 NOTE — Progress Notes (Signed)
VASCULAR PROGRESS NOTE  SUBJECTIVE: Still with left foot pain   PHYSICAL EXAM: Filed Vitals:   08/29/12 1640 08/29/12 2037 08/30/12 0016 08/30/12 0529  BP: 184/72 135/72 105/64 112/65  Pulse: 90 92 73 76  Temp: 98.4 F (36.9 C) 98.3 F (36.8 C) 98.5 F (36.9 C) 98.7 F (37.1 C)  TempSrc: Oral Oral Oral Oral  Resp: 18 19 20 18   Height:      Weight:      SpO2: 97% 98% 98% 100%   Left foot is cool.  Palpable femoral pulses.   LABS: Lab Results  Component Value Date   WBC 11.4* 08/30/2012   HGB 13.2 08/30/2012   HCT 40.0 08/30/2012   MCV 87.9 08/30/2012   PLT 210 08/30/2012   Lab Results  Component Value Date   CREATININE 1.12* 08/29/2012   Lab Results  Component Value Date   INR 0.94 08/29/2012   CBG (last 3)   Recent Labs  08/29/12 1601 08/29/12 2119 08/30/12 0607  GLUCAP 166* 211* 281*   Reviewed doppler studies: Right ABI is 0.65. Left ABI is 0.14.  ASSESSMENT AND PLAN: 1. 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  2. For arteriogram tomorrow. Will premedicate for her dye allergy. 3. This is clearly a limb threatening situation.  4. Given multiple medical co morbidities (poorly controlled DM, CHF, obesity, asthma, HTN, Hyperlipidemia, CAD, COPD)  will consult hospitalists.   Cari Caraway Beeper: [phone] 08/30/2012

## 2012-08-30 NOTE — Progress Notes (Signed)
ANTICOAGULATION CONSULT NOTE - Follow Up Consult  Pharmacy Consult for heparin Indication: r/o LLE arterial thrombus  Labs:  Recent Labs  08/29/12 0900  08/30/12 0057 08/30/12 0927 08/30/12 1939  HGB 16.2*  --  13.2  --   --   HCT 49.2*  --  40.0  --   --   PLT 249  --  210  --   --   APTT 34  --   --   --   --   LABPROT 12.5  --   --   --   --   INR 0.94  --   --   --   --   HEPARINUNFRC  --   < > 0.85* 0.76* 0.22*  CREATININE 1.12*  --   --   --   --   < > = values in this interval not displayed.  Assessment: 79yo female on heparin for ischemic foot.  Heparin level is now subtherapeutic.  No issues with infusion per RN.  Goal of Therapy:  Heparin level 0.3-0.7 units/ml   Plan:  Will increase heparin gtt to 1300 units/hr Check HL in am  Talbert Cage  PharmD 08/30/2012,8:33 PM

## 2012-08-30 NOTE — Progress Notes (Signed)
ANTICOAGULATION CONSULT NOTE - Follow Up Consult  Pharmacy Consult for heparin Indication: r/o LLE arterial thrombus  Labs:  Recent Labs  08/29/12 0900 08/29/12 1622 08/30/12 0057  HGB 16.2*  --  13.2  HCT 49.2*  --  40.0  PLT 249  --  210  APTT 34  --   --   LABPROT 12.5  --   --   INR 0.94  --   --   HEPARINUNFRC  --  0.54 0.85*  CREATININE 1.12*  --   --     Assessment: 79yo female now supratherapeutic on heparin after one level at goal, apparently accumulating.  Goal of Therapy:  Heparin level 0.3-0.7 units/ml   Plan:  Will decrease heparin gtt by 2 units/kg/hr to 1300 units/hr and check level in 8hr.  Vernard Gambles, PharmD, BCPS  08/30/2012,1:43 AM

## 2012-08-31 ENCOUNTER — Encounter (HOSPITAL_COMMUNITY)
Admission: EM | Disposition: A | Discharge status: Skilled Nursing Facility | Payer: Self-pay | Source: Home / Self Care | Attending: Vascular Surgery

## 2012-08-31 DIAGNOSIS — I70269 Atherosclerosis of native arteries of extremities with gangrene, unspecified extremity: Secondary | ICD-10-CM

## 2012-08-31 DIAGNOSIS — G4733 Obstructive sleep apnea (adult) (pediatric): Secondary | ICD-10-CM

## 2012-08-31 HISTORY — PX: ABDOMINAL ANGIOGRAM: SHX5499

## 2012-08-31 LAB — CBC
HCT: 37.9 % (ref 36.0–46.0)
Hemoglobin: 12.2 g/dL (ref 12.0–15.0)
MCH: 28.2 pg (ref 26.0–34.0)
MCHC: 32.5 g/dL (ref 30.0–36.0)
MCV: 87.7 fL (ref 78.0–100.0)
Platelets: 232 10*3/uL (ref 150–400)
RBC: 4.32 MIL/uL (ref 3.87–5.11)
RBC: 4.61 MIL/uL (ref 3.87–5.11)
RDW: 14.3 % (ref 11.5–15.5)
WBC: 9.3 10*3/uL (ref 4.0–10.5)

## 2012-08-31 LAB — CREATININE, SERUM: Creatinine, Ser: 1.85 mg/dL — ABNORMAL HIGH (ref 0.50–1.10)

## 2012-08-31 LAB — GLUCOSE, CAPILLARY
Glucose-Capillary: 357 mg/dL — ABNORMAL HIGH (ref 70–99)
Glucose-Capillary: 311 mg/dL — ABNORMAL HIGH (ref 70–99)

## 2012-08-31 SURGERY — ABDOMINAL ANGIOGRAM
Anesthesia: LOCAL

## 2012-08-31 MED ORDER — OXYCODONE-ACETAMINOPHEN 5-325 MG PO TABS
1.0000 | ORAL_TABLET | ORAL | Status: DC | PRN
  Administered 2012-08-31: 1 via ORAL
  Administered 2012-08-31 – 2012-09-01 (×2): 2 via ORAL
  Administered 2012-09-01 (×2): 1 via ORAL
  Administered 2012-09-02 (×2): 2 via ORAL
  Filled 2012-08-31 (×2): qty 1
  Filled 2012-08-31: qty 2
  Filled 2012-08-31: qty 1
  Filled 2012-08-31 (×3): qty 2

## 2012-08-31 MED ORDER — MIDAZOLAM HCL 2 MG/2ML IJ SOLN
INTRAMUSCULAR | Status: AC
  Filled 2012-08-31: qty 2

## 2012-08-31 MED ORDER — INSULIN ASPART 100 UNIT/ML ~~LOC~~ SOLN: 0.0000 [IU] | Freq: Three times a day (TID) | SUBCUTANEOUS | Status: DC

## 2012-08-31 MED ORDER — INSULIN ASPART 100 UNIT/ML ~~LOC~~ SOLN
2.0000 [IU] | Freq: Three times a day (TID) | SUBCUTANEOUS | Status: DC
  Administered 2012-08-31: 2 [IU] via SUBCUTANEOUS

## 2012-08-31 MED ORDER — ACETAMINOPHEN 325 MG PO TABS
650.0000 mg | ORAL_TABLET | ORAL | Status: DC | PRN
  Administered 2012-09-03: 650 mg via ORAL
  Filled 2012-08-31: qty 2

## 2012-08-31 MED ORDER — FENTANYL CITRATE 0.05 MG/ML IJ SOLN
INTRAMUSCULAR | Status: AC
  Filled 2012-08-31: qty 2

## 2012-08-31 MED ORDER — ONDANSETRON HCL 4 MG/2ML IJ SOLN: 4.0000 mg | Freq: Four times a day (QID) | INTRAMUSCULAR | Status: DC | PRN

## 2012-08-31 MED ORDER — INSULIN ASPART 100 UNIT/ML ~~LOC~~ SOLN: 2.0000 [IU] | Freq: Three times a day (TID) | SUBCUTANEOUS | Status: DC

## 2012-08-31 MED ORDER — ENOXAPARIN SODIUM 40 MG/0.4ML ~~LOC~~ SOLN
40.0000 mg | SUBCUTANEOUS | Status: DC
  Administered 2012-08-31 – 2012-09-01 (×2): 40 mg via SUBCUTANEOUS
  Filled 2012-08-31 (×2): qty 0.4

## 2012-08-31 MED ORDER — ENOXAPARIN SODIUM 40 MG/0.4ML ~~LOC~~ SOLN: 40.0000 mg | SUBCUTANEOUS | Status: DC

## 2012-08-31 MED ORDER — SODIUM CHLORIDE 0.9 % IV SOLN
1.0000 mL/kg/h | INTRAVENOUS | Status: AC
  Administered 2012-08-31: 1 mL/kg/h via INTRAVENOUS

## 2012-08-31 MED ORDER — INSULIN GLARGINE 100 UNIT/ML ~~LOC~~ SOLN
30.0000 [IU] | Freq: Every day | SUBCUTANEOUS | Status: DC
  Administered 2012-08-31: 30 [IU] via SUBCUTANEOUS
  Filled 2012-08-31: qty 0.3

## 2012-08-31 MED ORDER — INSULIN ASPART 100 UNIT/ML ~~LOC~~ SOLN
0.0000 [IU] | Freq: Three times a day (TID) | SUBCUTANEOUS | Status: DC
  Administered 2012-08-31: 15 [IU] via SUBCUTANEOUS
  Administered 2012-09-01 (×2): 11 [IU] via SUBCUTANEOUS
  Administered 2012-09-01: 5 [IU] via SUBCUTANEOUS
  Administered 2012-09-02: 8 [IU] via SUBCUTANEOUS
  Administered 2012-09-02: 3 [IU] via SUBCUTANEOUS
  Administered 2012-09-02: 8 [IU] via SUBCUTANEOUS
  Administered 2012-09-03: 3 [IU] via SUBCUTANEOUS
  Administered 2012-09-03: 5 [IU] via SUBCUTANEOUS
  Administered 2012-09-03: 3 [IU] via SUBCUTANEOUS
  Administered 2012-09-04: 5 [IU] via SUBCUTANEOUS
  Administered 2012-09-04: 3 [IU] via SUBCUTANEOUS
  Administered 2012-09-05: 5 [IU] via SUBCUTANEOUS
  Administered 2012-09-05 – 2012-09-06 (×3): 3 [IU] via SUBCUTANEOUS
  Administered 2012-09-06: 8 [IU] via SUBCUTANEOUS
  Administered 2012-09-06 – 2012-09-08 (×5): 3 [IU] via SUBCUTANEOUS
  Administered 2012-09-08: 8 [IU] via SUBCUTANEOUS
  Administered 2012-09-08: 5 [IU] via SUBCUTANEOUS
  Administered 2012-09-09: 3 [IU] via SUBCUTANEOUS
  Administered 2012-09-09: 15 [IU] via SUBCUTANEOUS

## 2012-08-31 NOTE — Progress Notes (Signed)
Patient was very confused attempting to get out of bed.  Patient had completely soiled her gown and bed linen.  Patient has a stage II on her bottom and urine has been soaking through her mepilex.  MD notified and orders received to insert foley.  Will continue to monitor.

## 2012-08-31 NOTE — Progress Notes (Signed)
Pt returned from cath lab, VSS, pt resting in bed with husband at bedside. Pt alert to self, but disoriented to situation, time, and place; lethargic but arousable. Will continue to monitor closely.

## 2012-08-31 NOTE — Progress Notes (Signed)
ANTICOAGULATION CONSULT NOTE - Follow Up Consult  Pharmacy Consult for heparin Indication: r/o LLE arterial thrombus  Labs:  Recent Labs  08/29/12 0900  08/30/12 0057 08/30/12 0927 08/30/12 1939 08/31/12 0543  HGB 16.2*  --  13.2  --   --  12.2  HCT 49.2*  --  40.0  --   --  37.9  PLT 249  --  210  --   --  207  APTT 34  --   --   --   --   --   LABPROT 12.5  --   --   --   --   --   INR 0.94  --   --   --   --   --   HEPARINUNFRC  --   < > 0.85* 0.76* 0.22* 0.84*  CREATININE 1.12*  --   --   --   --   --   < > = values in this interval not displayed.  Assessment: 79yo female now supratherapeutic on heparin after rate increase for low level.  Goal of Therapy:  Heparin level 0.3-0.7 units/ml   Plan:  Will increase heparin gtt to between rates which caused low and high levels (1250 units/hr) and check level in 8hr.  Vernard Gambles, PharmD, BCPS  08/31/2012,6:37 AM

## 2012-08-31 NOTE — Progress Notes (Signed)
Nutrition Brief Note  Patient identified on the Malnutrition Screening Tool (MST) Report.  Body mass index is 34.28 kg/(m^2). Patient meets criteria for Obesity Class II based on current BMI.   Current diet order is NPO for procedure, however previously on a Heart Healthy diet.  Patient is consuming approximately 75% of meals.  Labs and medications reviewed.   No nutrition interventions warranted at this time. If nutrition issues arise, please consult RD.   Maureen Chatters, RD, LDN Pager #: [phone] After-Hours Pager #: [phone]

## 2012-08-31 NOTE — Consult Note (Addendum)
[facility]  PATIENT DETAILS Name: [name] Age: 79 y.o. Sex: female Date of Birth: 10/09/1929 Admit Date: 08/29/2012 ZOX:[id number], Gregary Signs, MD Requesting MD: Chuck Hint, MD  Date of consultation: 08/30/12  REASON FOR CONSULTATION:  Management of medical comorbidities  Impression Principal Problem:   Ischemic foot Active Problems:   DM (diabetes mellitus), type 2, uncontrolled, with renal complications   HYPERLIPIDEMIA   HYPERTENSION   CORONARY ARTERY DISEASE   Chronic combined systolic and diastolic heart failure   PERIPHERAL VASCULAR DISEASE   COPD   GERD   OSA  Narrative: Patient is a 79 year old African American female with significant past medical history of diabetes, hypertension, known peripheral as the disease, known coronary artery disease, combined systolic and diastolic dysfunction who presented with worsening pain in her left lower extremity. In the ED she was found to have a cold left foot, she was seen then by vascular surgery and admitted to their service. Hospitalist service was asked to evaluate the patient and manage her medical comorbidities. During my evaluation, patient was lying comfortably in bed and appeared in no distress. She had no major complaints except for pain in her left foot area.  Recommendations 1. Ischemic foot: Agree with heparin infusion, for angiogram today. Rest per primary service 2. Chronic combined systolic and diastolic heart failure: Appears clinically compensated, lungs are clear, she does not have any significant peripheral edema. 2-D echocardiogram on 05/31/12 showed EF around 40-45% with diffuse hypokinesis. Continue with 180 mg of Lasix twice a day and 5 mg of metolazone as needed. She currently looks euvolemic to me. Weight have been stable since admission. Continue with labetalol, Imdur and hydralazine. Home weight - per patient around 212, and it is 212 here as well. Continue to monitor.   3. COPD: Lungs are currently clear. We'll start some nebulized bronchodilators via inpatient-particularly if she is going to need vascular surgery or amputation. Will add incentive spirometry. Is on O2 here however appears that this is for comfort and she did not have desatting events. Will need to be weaned off before d/c. 4. OSA: Apparently is on CPAP at home. Continue that here.  5. Diabetes: Currently on SSI-sugars mostly uncontrolled. Will start Lantus while inpatient. Adjust accordingly. Sees Dr. Everardo All with [facility]. There are difficulties controlling her DM as there is reported non compliance, patient wishes to be on 70/30 at home due to cost and she also tries to simplify her regimen. Will increase her Lantus today by 20% to 30 Units and add standing mealtime short acting coverage in addition to sliding scale (Hold if NPO or if patient eats less than half of her meals). Also, she is on D5NS here, albeit at low dose, would recommend if Vascular agrees changing to NS since may make her CBG worse.  6. Peripheral neuropathy- continue with Neurontin 7. Hypertension- currently controlled-continue with current regimen 8. History of CAD- no chest pain or shortness of breath. Continue with aspirin, statin and beta blockers. 9. GERD- continue PPI 10.  Preop assessment - doubt she needs further workup prior to any further surgical procedures, however if she were to require bypass-then we probably will need cardiology for preop assessment as well   DVT Prophylaxis: - Not needed as on heparin infusion  Code Status: - Full code Hospitalist service will followup again tomorrow. Please contact me if I can be of assistance in the meanwhile. Thank you for this consultation.  PHYSICAL EXAM: Blood pressure 139/55, pulse 73, temperature 99.1  F (37.3 C), temperature source Oral, resp. rate 18, height 5\' 6"  (1.676 m), weight 96.3 kg (212 lb 4.9 oz), SpO2 98.00%.  General appearance :Awake, alert,  not in any distress. Speech Clear. Not toxic Looking HEENT: Atraumatic and Normocephalic, pupils equally reactive to light and accomodation Neck: supple, no JVD. No cervical lymphadenopathy.  Chest:Good air entry bilaterally, no added sounds, no crackles CVS: S1 S2 regular, no murmurs.  Abdomen: Bowel sounds present, Non tender and not distended with no gaurding, rigidity or rebound. Extremities: B/L Lower Ext shows no edema, right lower extremity is warm to touch, left lower extremity is cold to touch from her left lower third of the leg  Neurology: Awake alert, and oriented X 3, CN II-XII intact, Non focal Skin:No rashes  LABS:  Recent Labs  08/29/12 0900  NA 133*  K 4.1  CL 94*  CO2 25  GLUCOSE 143*  BUN 18  CREATININE 1.12*  CALCIUM 9.5    Recent Labs  08/30/12 0057 08/31/12 0543  WBC 11.4* 9.3  HGB 13.2 12.2  HCT 40.0 37.9  MCV 87.9 87.7  PLT 210 207   RADIOLOGIC STUDIES: No results found.  Pamella Pert [facility] Pager [phone] If 7PM-7AM, please contact night-coverage [email or web address] Password [email or web address] 08/31/2012, 9:08 AM

## 2012-08-31 NOTE — Interval H&P Note (Signed)
History and Physical Interval Note:  08/31/2012 10:25 AM  [name]  has presented today for surgery, with the diagnosis of claudication  The various methods of treatment have been discussed with the patient and family. After consideration of risks, benefits and other options for treatment, the patient has consented to  Procedure(s): ABDOMINAL ANGIOGRAM with runoff poss intervention (N/A) as a surgical intervention .  The patient's history has been reviewed, patient examined, no change in status, stable for surgery.  I have reviewed the patient's chart and labs.  Questions were answered to the patient's satisfaction.     [name] S

## 2012-08-31 NOTE — Op Note (Signed)
PATIENT: [name]  MRN: [id number] DOB: 1929-12-11    DATE OF PROCEDURE: 08/31/2012  INDICATIONS: [name] is a 79 y.o. female who presented with an ischemic left leg. She had evidence of chronic tibial artery occlusive disease which had progressed.  PROCEDURE:  1. Ultrasound guided access to the right common femoral artery. 2. Aortogram with bilateral iliac arteriogram. 3. Selective catheterization of the left external iliac artery with left lower extremity runoff. 4. Right lower tree arteriogram via right femoral arteriogram  SURGEON: Di Kindle. Edilia Bo, MD, FACS  ANESTHESIA: local with sedation   EBL: minimal  TECHNIQUE: The patient was taken to the peripheral vascular lab and received 1 mg of Versed and 50 mcg of fentanyl. Both groins were prepped and draped in the usual sterile fashion. After the skin was infiltrated with 1% lidocaine, and under ultrasound guidance, the right common femoral artery was cannulated and a guidewire introduced into the infrarenal aorta under fluoroscopic control. A 5 French sheath was introduced over the wire. A pigtail catheter was positioned at the L1 vertebral body flush aortogram obtained to catheter was in position above the aortic bifurcation and exchange for a crossover catheter which was positioned into the left common iliac artery. Wire was advanced into the external iliac artery and then the crossover catheter exchanged for a 4 French endhole catheter. Selective left external iliac arteriogram was obtained with left lower extremity runoff. His catheter was then removed and a right femoral arteriogram obtained for right lower extremity runoff. At the completion of procedure attempted to Perclose the artery however after in moving the sheath advancing the Perclose device over the wire it would not advance easily in order to get good backbleeding from the Perclose device and therefore I elected to simply replace the sheath which was  done over a wire.  FINDINGS:  1. Her single renal arteries bilaterally with no significant renal artery stenosis identified. The infrarenal aorta is widely patent. Both common iliac arteries external iliac arteries and hypogastric arteries are patent bilaterally. 2. On the left side, which is the symptomatic side, the common femoral, superficial femoral, deep femoral and popliteal artery are patent. The anterior tibial artery is occluded. The tibial peroneal trunk is occluded. The only vessel which reconstitutes distally as a small peroneal artery. 3. On the right side, the common femoral, superficial femoral, and deep femoral artery are patent. It popped artery is patent. The anterior tibial artery is occluded distally in the distal tibial peroneal trunk is occluded. There is reconstitution of the anterior tibial artery distally on the right and the distal peroneal artery.  CLINICAL NOTE: There are no options for revascularization on the left. If the patient's symptoms progressed she would likely require a left below the knee amputation.  Waverly Ferrari, MD, FACS [facility]  DATE OF DICTATION:   08/31/2012

## 2012-09-01 DIAGNOSIS — I739 Peripheral vascular disease, unspecified: Secondary | ICD-10-CM

## 2012-09-01 DIAGNOSIS — G9340 Encephalopathy, unspecified: Secondary | ICD-10-CM

## 2012-09-01 DIAGNOSIS — N390 Urinary tract infection, site not specified: Secondary | ICD-10-CM

## 2012-09-01 LAB — URINALYSIS, ROUTINE W REFLEX MICROSCOPIC
Bilirubin Urine: NEGATIVE
Glucose, UA: 250 mg/dL — AB
Glucose, UA: 500 mg/dL — AB
Ketones, ur: NEGATIVE mg/dL
Nitrite: NEGATIVE
Nitrite: NEGATIVE
Specific Gravity, Urine: 1.036 — ABNORMAL HIGH (ref 1.005–1.030)
Specific Gravity, Urine: 1.028 (ref 1.005–1.030)
pH: 5 (ref 5.0–8.0)
pH: 5 (ref 5.0–8.0)

## 2012-09-01 LAB — GLUCOSE, CAPILLARY: Glucose-Capillary: 244 mg/dL — ABNORMAL HIGH (ref 70–99)

## 2012-09-01 LAB — BASIC METABOLIC PANEL
BUN: 40 mg/dL — ABNORMAL HIGH (ref 6–23)
CO2: 27 mEq/L (ref 19–32)
Chloride: 96 mEq/L (ref 96–112)
GFR calc Af Amer: 25 mL/min — ABNORMAL LOW (ref >90)
Potassium: 4.8 mEq/L (ref 3.5–5.1)

## 2012-09-01 LAB — URINE MICROSCOPIC-ADD ON

## 2012-09-01 LAB — CBC
HCT: 39.5 % (ref 36.0–46.0)
Hemoglobin: 13.4 g/dL (ref 12.0–15.0)
MCHC: 33.9 g/dL (ref 30.0–36.0)
MCV: 87.4 fL (ref 78.0–100.0)

## 2012-09-01 MED ORDER — ALBUTEROL SULFATE (5 MG/ML) 0.5% IN NEBU: 2.5000 mg | INHALATION_SOLUTION | Freq: Four times a day (QID) | RESPIRATORY_TRACT | Status: DC | PRN

## 2012-09-01 MED ORDER — HYDRALAZINE HCL 25 MG PO TABS
25.0000 mg | ORAL_TABLET | Freq: Three times a day (TID) | ORAL | Status: DC
  Administered 2012-09-01 – 2012-09-05 (×12): 25 mg via ORAL
  Filled 2012-09-01 (×16): qty 1

## 2012-09-01 MED ORDER — INSULIN GLARGINE 100 UNIT/ML ~~LOC~~ SOLN
35.0000 [IU] | Freq: Every day | SUBCUTANEOUS | Status: DC
  Administered 2012-09-01 – 2012-09-07 (×6): 35 [IU] via SUBCUTANEOUS
  Filled 2012-09-01 (×9): qty 0.35

## 2012-09-01 MED ORDER — INSULIN ASPART 100 UNIT/ML ~~LOC~~ SOLN
3.0000 [IU] | Freq: Three times a day (TID) | SUBCUTANEOUS | Status: DC
Start: 2012-09-01 — End: 2012-09-02
  Administered 2012-09-01 – 2012-09-02 (×4): 3 [IU] via SUBCUTANEOUS

## 2012-09-01 MED ORDER — FLUCONAZOLE 100 MG PO TABS
100.0000 mg | ORAL_TABLET | Freq: Every day | ORAL | Status: DC
  Administered 2012-09-01 – 2012-09-04 (×4): 100 mg via ORAL
  Filled 2012-09-01 (×4): qty 1

## 2012-09-01 MED ORDER — NITROGLYCERIN 0.4 MG SL SUBL: 0.4000 mg | SUBLINGUAL_TABLET | SUBLINGUAL | Status: DC | PRN

## 2012-09-01 MED ORDER — ENOXAPARIN SODIUM 30 MG/0.3ML ~~LOC~~ SOLN
30.0000 mg | SUBCUTANEOUS | Status: DC
  Administered 2012-09-02 – 2012-09-07 (×5): 30 mg via SUBCUTANEOUS
  Filled 2012-09-01 (×7): qty 0.3

## 2012-09-01 MED ORDER — IPRATROPIUM BROMIDE 0.02 % IN SOLN: 0.5000 mg | Freq: Four times a day (QID) | RESPIRATORY_TRACT | Status: DC | PRN

## 2012-09-01 MED ORDER — GABAPENTIN 300 MG PO CAPS
300.0000 mg | ORAL_CAPSULE | Freq: Every day | ORAL | Status: DC
  Administered 2012-09-01 – 2012-09-03 (×3): 300 mg via ORAL
  Filled 2012-09-01 (×4): qty 1

## 2012-09-01 MED ORDER — ASPIRIN EC 325 MG PO TBEC
325.0000 mg | DELAYED_RELEASE_TABLET | Freq: Every day | ORAL | Status: DC
  Administered 2012-09-01 – 2012-09-04 (×4): 325 mg via ORAL
  Filled 2012-09-01 (×6): qty 1

## 2012-09-01 MED ORDER — SULFAMETHOXAZOLE-TMP DS 800-160 MG PO TABS
1.0000 | ORAL_TABLET | ORAL | Status: DC
  Administered 2012-09-01 – 2012-09-03 (×3): 1 via ORAL
  Filled 2012-09-01 (×4): qty 1

## 2012-09-01 MED ORDER — LABETALOL HCL 100 MG PO TABS
100.0000 mg | ORAL_TABLET | Freq: Two times a day (BID) | ORAL | Status: DC
  Administered 2012-09-01 – 2012-09-02 (×3): 100 mg via ORAL
  Filled 2012-09-01 (×4): qty 1

## 2012-09-01 MED ORDER — ATORVASTATIN CALCIUM 80 MG PO TABS
80.0000 mg | ORAL_TABLET | Freq: Every day | ORAL | Status: DC
  Administered 2012-09-01 – 2012-09-03 (×3): 80 mg via ORAL
  Filled 2012-09-01 (×4): qty 1

## 2012-09-01 MED ORDER — FLUCONAZOLE 150 MG PO TABS: 150.0000 mg | ORAL_TABLET | Freq: Every day | ORAL | Status: DC

## 2012-09-01 MED ORDER — FLUCONAZOLE 200 MG PO TABS: 400.0000 mg | ORAL_TABLET | Freq: Every day | ORAL | Status: DC

## 2012-09-01 MED ORDER — ISOSORBIDE DINITRATE 30 MG PO TABS
30.0000 mg | ORAL_TABLET | Freq: Two times a day (BID) | ORAL | Status: DC
  Administered 2012-09-01 – 2012-09-09 (×17): 30 mg via ORAL
  Filled 2012-09-01 (×18): qty 1

## 2012-09-01 MED ORDER — EZETIMIBE 10 MG PO TABS
10.0000 mg | ORAL_TABLET | Freq: Every day | ORAL | Status: DC
  Administered 2012-09-01 – 2012-09-04 (×4): 10 mg via ORAL
  Filled 2012-09-01 (×4): qty 1

## 2012-09-01 NOTE — Progress Notes (Signed)
[facility] [place]  Subjective  - Mild confusion but oriented x 3, foley placed to keep urine (incontinence) off of sacral decubitus, foot pain tolerable   Objective 152/77 95 98.6 F (37 C) (Oral) 18 98%  Intake/Output Summary (Last 24 hours) at 09/01/12 1216 Last data filed at 09/01/12 0900  Gross per 24 hour  Intake    960 ml  Output   1000 ml  Net    -40 ml   Feet cool bilaterally no real change No groin hematoma  Assessment/Planning: Urinary tract infection start empiric Bactrim, culture pending. Altered mental status and incontinence should improve as UTI resolves D/c foley in am Rest pain foot pain well controlled Will have PT assist with mobility today Most likely d/c tomorrow Will defer restart of lasix until tomorrow since recent contrast load and elevated creatinine  Elvira Langston E 09/01/2012 12:16 PM --  Laboratory Lab Results:  Recent Labs  08/31/12 1408 09/01/12 1133  WBC 11.3* 18.1*  HGB 13.0 13.4  HCT 40.0 39.5  PLT 232 245   BMET  Recent Labs  08/31/12 1408  CREATININE 1.85*    COAG Lab Results  Component Value Date   INR 0.94 08/29/2012   INR 1.03 05/30/2012   INR 1.0 11/15/2008   No results found for this basename: PTT

## 2012-09-01 NOTE — Progress Notes (Signed)
Inpatient Diabetes Program Recommendations  AACE/ADA: New Consensus Statement on Inpatient Glycemic Control (2013)  Target Ranges:  Prepandial:   less than 140 mg/dL      Peak postprandial:   less than 180 mg/dL (1-2 hours)      Critically ill patients:  140 - 180 mg/dL   Results for [name], [name] (MRN [id number]) as of 09/01/2012 17:24  Ref. Range 08/31/2012 16:39 08/31/2012 21:17 09/01/2012 06:16 09/01/2012 11:36 09/01/2012 16:25  Glucose-Capillary Latest Range: 70-99 mg/dL 562 (H) 130 (H) 865 (H) 320 (H) 220 (H)   Noted insulin adjustments today.   Consider increasing Novolog correction scale to resistant. Will follow. Thank you  Piedad Climes BSN, RN,CDE Inpatient Diabetes Coordinator [phone] (team pager)

## 2012-09-01 NOTE — Progress Notes (Signed)
Pt. Refuses to wear CPAP at this time. Pt. Was made aware to call if she changed her mind anytime during the night & decided to wear CPAP.

## 2012-09-01 NOTE — Evaluation (Signed)
Physical Therapy Evaluation Patient Details Name: TEIA FREITAS MRN: [id number] DOB: 06/13/1930 Today's Date: 09/01/2012 Time: 4098-1191 PT Time Calculation (min): 39 min  PT Assessment / Plan / Recommendation Clinical Impression  79 yo adm with Lt worse than Rt foot ischemia. Pt underwent bil LE arteriogram and found not to be a candidate for revascularization surgery. Per surgery note, if pt cannot tolerate the pain, the only option is BKA. Pt very limited by pain this date and doubtful she could fully participate/benefit from intensive therapies unless pain control improves. Will better coordinate with pain medication on next visit. Anticipate pt will do well with therapy once pain manageable, as she was motivated and pushing herself to do as much as possible today.    PT Assessment  Patient needs continued PT services    Follow Up Recommendations  CIR;Supervision for mobility/OOB    Does the patient have the potential to tolerate intense rehabilitation      Barriers to Discharge Inaccessible home environment      Equipment Recommendations  Wheelchair (measurements PT);Wheelchair cushion (measurements PT)    Recommendations for Other Services Rehab consult   Frequency Min 2X/week    Precautions / Restrictions Precautions Precautions: Fall Restrictions Weight Bearing Restrictions: No Other Position/Activity Restrictions: pt self-selects NWB Lt foot due to pain   Pertinent Vitals/Pain 10/10 Lt foot with any movement; repositioned; allowed pt to move LLE; RN notified need for pain medicine      Mobility  Bed Mobility Bed Mobility: Rolling Left;Left Sidelying to Sit;Sitting - Scoot to Edge of Bed;Sit to Sidelying Left Rolling Left: 6: Modified independent (Device/Increase time);With rail Left Sidelying to Sit: 4: Min assist;With rails;HOB elevated (HOB 25) Sitting - Scoot to Edge of Bed: 2: Max assist Sit to Sidelying Left: 3: Mod assist;HOB flat;With rail Details  for Bed Mobility Assistance: pt able to move LLE over EOB herself with incr time (did not want anyone to touch it); assist to raise torso to upright sitting; assist to raise bil legs up onto bed as returned to sidelying Transfers Transfers: Not assessed (due to pain, could not tolerate putting Lt foot on floor)    Exercises General Exercises - Lower Extremity Ankle Circles/Pumps: AROM;Both;5 reps;Supine Heel Slides: AAROM;Both;5 reps;Supine   PT Diagnosis: Difficulty walking;Acute pain  PT Problem List: Decreased range of motion;Decreased activity tolerance;Decreased balance;Decreased mobility;Decreased cognition;Decreased knowledge of use of DME;Impaired sensation;Pain PT Treatment Interventions: DME instruction;Gait training;Functional mobility training;Therapeutic activities;Therapeutic exercise;Cognitive remediation;Patient/family education;Wheelchair mobility training   PT Goals Acute Rehab PT Goals PT Goal Formulation: With patient/family Time For Goal Achievement: 09/08/12 Potential to Achieve Goals: Good Pt will Roll Supine to Right Side: with modified independence PT Goal: Rolling Supine to Right Side - Progress: Goal set today Pt will Roll Supine to Left Side: with modified independence PT Goal: Rolling Supine to Left Side - Progress: Goal set today Pt will go Supine/Side to Sit: with supervision;with HOB 0 degrees PT Goal: Supine/Side to Sit - Progress: Goal set today Pt will Sit at [facility] of Bed: 1-2 min;Independently;with no upper extremity support PT Goal: Sit at [facility] Of Bed - Progress: Goal set today Pt will go Sit to Supine/Side: with supervision;with HOB 0 degrees PT Goal: Sit to Supine/Side - Progress: Goal set today Pt will go Sit to Stand: with mod assist PT Goal: Sit to Stand - Progress: Goal set today Pt will Transfer Bed to Chair/Chair to Bed: with +2 total assist (pt= 70%) PT Transfer Goal: Bed to Chair/Chair to Bed -  Progress: Goal set today Pt will Perform Home  Exercise Program: with supervision, verbal cues required/provided PT Goal: Perform Home Exercise Program - Progress: Goal set today  Visit Information  Last PT Received On: 09/01/12 Assistance Needed: +2 (transfers)    Subjective Data  Patient Stated Goal: Husband reports she needs to be able to get OOB (even to w/c) to be able to go home   Prior Functioning  Home Living Lives With: Spouse Available Help at Discharge: Family;Available 24 hours/day Type of Home: House Home Access: Stairs to enter [employer] of Steps: 2 Entrance Stairs-Rails: None Home Layout: One level Bathroom Shower/Tub: Health visitor: Handicapped height Bathroom Accessibility: Yes How Accessible: Accessible via walker Home Adaptive Equipment: Dan Humphreys - four wheeled;Walker - standard;Straight cane;Built-in shower seat;Shower chair without back Additional Comments: rollator will not fit through kitchen; could go bedroom to bathroom Prior Function Level of Independence: Independent with assistive device(s) Able to Take Stairs?: Yes Comments: usually used cane (for some time) Communication Communication: HOH    Cognition  Cognition Overall Cognitive Status: Impaired Area of Impairment: Memory;Other (comment) (AMS due to UTI) Arousal/Alertness: Awake/alert Orientation Level: Disoriented to;Place;Time Behavior During Session: WFL for tasks performed Memory Deficits: husband assisting with answering prior function/home living questions    Extremity/Trunk Assessment Right Upper Extremity Assessment RUE ROM/Strength/Tone: [facility] for tasks assessed Left Upper Extremity Assessment LUE ROM/Strength/Tone: WFL for tasks assessed Right Lower Extremity Assessment RLE ROM/Strength/Tone: Deficits;Due to pain RLE ROM/Strength/Tone Deficits: AAROM hip to 90, knee to 90, ankle DF to neutral Left Lower Extremity Assessment LLE ROM/Strength/Tone: Deficits;Due to pain LLE ROM/Strength/Tone  Deficits: unable to tolerate much AAROM; hip flexion to 30, knee flexion to 20, AROM only Lt toe wiggling and trace dorsiflexion Trunk Assessment Trunk Assessment: Kyphotic   Balance Balance Balance Assessed: Yes Static Sitting Balance Static Sitting - Balance Support: Bilateral upper extremity supported;Feet unsupported Static Sitting - Level of Assistance: 5: Stand by assistance  End of Session PT - End of Session Activity Tolerance: Patient limited by pain Patient left: in bed;with call bell/phone within reach;with family/visitor present;with nursing in room Nurse Communication: Mobility status;Patient requests pain meds  GP     Desmund Elman 09/01/2012, 3:42 PM Pager [phone]

## 2012-09-01 NOTE — Progress Notes (Signed)
Urinalysis sent to lab, PA notified. Pt still disoriented to time, place, and situation. Husband at bedside. Will continue to monitor.

## 2012-09-01 NOTE — Care Management Note (Unsigned)
    Page 1 of 2   09/08/2012     4:08:44 PM   CARE MANAGEMENT NOTE 09/08/2012  Patient:  [name], [name]   Account Number:  [id number]  Date Initiated:  09/01/2012  Documentation initiated by:  Junius Creamer  Subjective/Objective Assessment:   adm w ischemic foot     Action/Plan:   lives w husband, pcp dr Romero Belling, hx of [facility]   Anticipated DC Date:  09/09/2012   Anticipated DC Plan:  SKILLED NURSING FACILITY  In-house referral  Clinical Social Worker      DC Planning Services  CM consult      Choice offered to / List presented to:             Status of service:  In process, will continue to follow Medicare Important Message given?   (If response is "NO", the following Medicare IM given date fields will be blank) Date Medicare IM given:   Date Additional Medicare IM given:    Discharge Disposition:    Per UR Regulation:  Reviewed for med. necessity/level of care/duration of stay  If discussed at Long Length of Stay Meetings, dates discussed:   09/08/2012    Comments:  09/08/12 Christina Warmuth,RN,BSN [id number] PT UNLIKELY CANDIDATE FOR REHAB POST OP, AS NOW CONFUSED AND WILL TAKE LONGER THAN 1-2 WEEKS TO REHAB.  WILL PLAN FOR SNF AT DC; PT/HUSB PREFER [facility].  09/03/12 Christina Kleinert,RN,BSN [phone] MD SCHEDULING BKA; DISPOSITION TO BE DETERMINED POST SURGERY.  WILL LIKELY NEED CIR VS. SNF.  WILL FOLLOW.  3/18 1436 Christina dowell rn,bsn sw saw pt and husb. pt's husb does not want snf. await phy ther eval. husb wants to take pt home and use [facility]. ref to [facility] w [facility]. will cont to follow.

## 2012-09-01 NOTE — Clinical Social Work Psychosocial (Signed)
Clinical Social Work Department BRIEF PSYCHOSOCIAL ASSESSMENT 09/01/2012  Patient:  [name], [name]     Account Number:  [id number]     Admit date:  08/29/2012  Clinical Social Worker:  Thomasene Mohair  Date/Time:  09/01/2012 02:30 PM  Referred by:  Physician  Date Referred:  08/31/2012 Referred for  SNF Placement   Other Referral:   Interview type:  Patient Other interview type:   [name] at bedside    PSYCHOSOCIAL DATA Living Status:  FAMILY Admitted from facility:   Level of care:   Primary support name:  Teneshia Hedeen [phone] Primary support relationship to patient:  SPOUSE Degree of support available:   adequate    CURRENT CONCERNS Current Concerns  Post-Acute Placement  Adjustment to Illness   Other Concerns:    SOCIAL WORK ASSESSMENT / PLAN CSW referred to Pt to assist with dc planning. Pt lives with her husband of 62 years and has 6 children who live out of the area. Pt and her husband report that they do not have family in [place] and do not elaborate on additional supports outside of family. CSW discussed Pt going to SNF for rehab and nursing care and both Pt and her spouse were not interested in that option. Pt is pending a PT evaluation, likely tomorrow. CSW will f/u for recommendations.   Assessment/plan status:  Psychosocial Support/Ongoing Assessment of Needs Other assessment/ plan:   CSW discussed insurance benefits for SNF if needed now or in the future. CSW will bring Pt's spouse a list of area SNFs for likely placement in the near future.   Information/referral to community resources:   SNF list    PATIENT'S/FAMILY'S RESPONSE TO PLAN OF CARE: Pt's spouse shared that he is a deacon in his church and has visited several places that he would not want his wife to go to. Pt prefers to go home and reports that home health worked with her at home 3 x week for 6 weeks. CSW advised that Pt might need more assistance then she did in  December and SNF is a viable option that focuses on recovery and management.  Pt's spouse agreeable to reviewing SNF list. CSW updated RN on encounter.   Frederico Hamman, LCSW [phone]

## 2012-09-01 NOTE — Consult Note (Signed)
[facility]  PATIENT DETAILS Name: [name] Age: 79 y.o. Sex: female Date of Birth: 1930-06-12 Admit Date: 08/29/2012 [id number], Gregary Signs, MD Requesting MD: Chuck Hint, MD  Date of consultation: 08/30/12  REASON FOR CONSULTATION:  Management of medical comorbidities  Impression Principal Problem:   Ischemic foot Active Problems:   DM (diabetes mellitus), type 2, uncontrolled, with renal complications   HYPERLIPIDEMIA   HYPERTENSION   CORONARY ARTERY DISEASE   Chronic combined systolic and diastolic heart failure   PERIPHERAL VASCULAR DISEASE   COPD   GERD   OSA  Narrative: Patient is a 79 year old African American female with significant past medical history of diabetes, hypertension, known peripheral as the disease, known coronary artery disease, combined systolic and diastolic dysfunction who presented with worsening pain in her left lower extremity. In the ED she was found to have a cold left foot, she was seen then by vascular surgery and admitted to their service. Hospitalist service was asked to evaluate the patient and manage her medical comorbidities. During my evaluation, patient was lying comfortably in bed and appeared in no distress. She had no major complaints except for pain in her left foot area.  Recommendations 1. Ischemic foot: Agree with heparin infusion, for angiogram today. Rest per primary service 2. Acute encephalopathy - onset after the procedure, may be related to sedation. Husband endorses that she has had confusion after sedation in the past. Will closely monitor as she needs to continue improving if that was the case. Urinalysis shows possible UTI, this was after the cath was inserted. Will repeat UA and consider treating empirically if repeat looks dirty again.  3. Chronic combined systolic and diastolic heart failure: Appears clinically compensated, lungs are clear, she does not have any significant peripheral  edema. 2-D echocardiogram on 05/31/12 showed EF around 40-45% with diffuse hypokinesis. Continue with 180 mg of Lasix twice a day and 5 mg of metolazone as needed. She currently looks euvolemic to me. Weight have been stable since admission. Continue with labetalol, Imdur and hydralazine. Home weight - per patient around 212, and it is 212 here as well. Continue to monitor. Will repeat renal function today after the angiogram before deciding further on the lasix dose.  4. COPD: Lungs are currently clear. We'll start some nebulized bronchodilators via inpatient-particularly if she is going to need vascular surgery or amputation. Will add incentive spirometry. Is on O2 here however appears that this is for comfort and she did not have desatting events. Will need to be weaned off before d/c. 5. OSA: Apparently is on CPAP at home. Continue that here.  6. Diabetes: Currently on SSI-sugars mostly uncontrolled. Will start Lantus while inpatient. Adjust accordingly. Sees Dr. Everardo All with [facility]. There are difficulties controlling her DM as there is reported non compliance, patient wishes to be on 70/30 at home due to cost and she also tries to simplify her regimen. Will continue to adjust more so today, Lantus 35 and Aspart 3 with meals PLUS sliding scale. At home she was on 80 U daily. 7. Peripheral neuropathy- continue with Neurontin 8. Hypertension- currently controlled-continue with current regimen 9. History of CAD- no chest pain or shortness of breath. Continue with aspirin, statin and beta blockers. 10. GERD- continue PPI 11.  Preop assessment - doubt she needs further workup prior to any further surgical procedures, however if she were to require bypass-then we probably will need cardiology for preop assessment as well   DVT Prophylaxis: - Not needed  as on heparin infusion  Code Status: - Full code Hospitalist service will followup again tomorrow. Please contact me if I can be of assistance  in the meanwhile. Thank you for this consultation.  PHYSICAL EXAM: Blood pressure 152/77, pulse 95, temperature 98.6 F (37 C), temperature source Oral, resp. rate 18, height 5\' 6"  (1.676 m), weight 94.9 kg (209 lb 3.5 oz), SpO2 98.00%.  General appearance :Awake, alert, not in any distress. Speech Clear. Not toxic Looking HEENT: Atraumatic and Normocephalic, pupils equally reactive to light and accomodation Neck: supple, no JVD. No cervical lymphadenopathy.  Chest:Good air entry bilaterally, no added sounds, no crackles CVS: S1 S2 regular, no murmurs.  Abdomen: Bowel sounds present, Non tender and not distended with no gaurding, rigidity or rebound. Extremities: B/L Lower Ext shows no edema, right lower extremity is warm to touch, left lower extremity is cold to touch from her left lower third of the leg  Neurology: Awake alert, and oriented to time and person not place, CN II-XII intact, Non focal Skin:No rashes  LABS:  Recent Labs  08/31/12 1408  CREATININE 1.85*    Recent Labs  08/31/12 0543 08/31/12 1408  WBC 9.3 11.3*  HGB 12.2 13.0  HCT 37.9 40.0  MCV 87.7 86.8  PLT 207 232   RADIOLOGIC STUDIES: No results found.  Pamella Pert [facility] Pager [phone] If 7PM-7AM, please contact night-coverage [email or web address] Password [facility] 09/01/2012, 11:21 AM

## 2012-09-01 NOTE — Progress Notes (Signed)
Patient's blood pressure at 0559 was 200/94.  Patient's blood pressure medications had been discontinued. MD notified and orders received.  Patient restarted on her PTA blood pressure medications.  Patient's follow up blood pressure was 152/77. Will continue to monitor.

## 2012-09-02 LAB — URINE CULTURE: Culture: NO GROWTH

## 2012-09-02 LAB — GLUCOSE, CAPILLARY: Glucose-Capillary: 261 mg/dL — ABNORMAL HIGH (ref 70–99)

## 2012-09-02 MED ORDER — INSULIN ASPART 100 UNIT/ML ~~LOC~~ SOLN
5.0000 [IU] | Freq: Three times a day (TID) | SUBCUTANEOUS | Status: DC
  Administered 2012-09-02 – 2012-09-09 (×6): 5 [IU] via SUBCUTANEOUS

## 2012-09-02 MED ORDER — OXYCODONE HCL 5 MG PO TABS
5.0000 mg | ORAL_TABLET | Freq: Four times a day (QID) | ORAL | Status: DC | PRN
  Administered 2012-09-02 (×2): 5 mg via ORAL
  Administered 2012-09-03 – 2012-09-04 (×4): 10 mg via ORAL
  Filled 2012-09-02 (×2): qty 2
  Filled 2012-09-02: qty 1
  Filled 2012-09-02 (×2): qty 2
  Filled 2012-09-02: qty 1

## 2012-09-02 MED ORDER — OXYCODONE-ACETAMINOPHEN 5-325 MG PO TABS
1.0000 | ORAL_TABLET | Freq: Three times a day (TID) | ORAL | Status: DC | PRN
  Administered 2012-09-02: 2 via ORAL
  Filled 2012-09-02: qty 2

## 2012-09-02 MED ORDER — LABETALOL HCL 200 MG PO TABS
200.0000 mg | ORAL_TABLET | Freq: Two times a day (BID) | ORAL | Status: DC
  Administered 2012-09-02 – 2012-09-08 (×13): 200 mg via ORAL
  Filled 2012-09-02 (×16): qty 1

## 2012-09-02 NOTE — Progress Notes (Signed)
Rehab Admissions Coordinator Note:  Patient was screened by [name] for appropriateness for an Inpatient Acute Rehab Consult.  Pt's mobility is limited by her pain at present on her therapy evaluation. I will await medical plan of care and follow her progress to assist with rehab venue options.  [name] 09/02/2012, 8:24 AM  I can be reached at [phone].

## 2012-09-02 NOTE — Progress Notes (Signed)
Inpatient Diabetes Program Recommendations  AACE/ADA: New Consensus Statement on Inpatient Glycemic Control (2013)  Target Ranges:  Prepandial:   less than 140 mg/dL      Peak postprandial:   less than 180 mg/dL (1-2 hours)      Critically ill patients:  140 - 180 mg/dL  Results for KWEEN, BACORN (MRN [id number]) as of 09/02/2012 11:46  Ref. Range 09/01/2012 11:36 09/01/2012 16:25 09/01/2012 21:31 09/02/2012 05:58 09/02/2012 11:34  Glucose-Capillary Latest Range: 70-99 mg/dL 409 (H) 811 (H) 914 (H) 261 (H) 286 (H)   Inpatient Diabetes Program Recommendations Insulin - Basal: Increase Lantus to 40 units  Insulin - Meal Coverage: Increase to 4 units TID with meals  Thank you  Piedad Climes BSN, RN,CDE Inpatient Diabetes Coordinator [phone] (team pager)

## 2012-09-02 NOTE — Consult Note (Addendum)
[facility]  PATIENT DETAILS Name: [name] Age: 79 y.o. Sex: female Date of Birth: 1930/06/09 Admit Date: 08/29/2012 [id number], Gregary Signs, MD Requesting MD: Chuck Hint, MD  Date of consultation: 08/30/12  REASON FOR CONSULTATION:  Management of medical comorbidities  Impression Principal Problem:   Ischemic foot Active Problems:   DM (diabetes mellitus), type 2, uncontrolled, with renal complications   HYPERLIPIDEMIA   HYPERTENSION   CORONARY ARTERY DISEASE   Chronic combined systolic and diastolic heart failure   PERIPHERAL VASCULAR DISEASE   COPD   GERD   OSA  Narrative: Patient is a 79 year old African American female with significant past medical history of diabetes, hypertension, known peripheral as the disease, known coronary artery disease, combined systolic and diastolic dysfunction who presented with worsening pain in her left lower extremity. In the ED she was found to have a cold left foot, she was seen then by vascular surgery and admitted to their service. Hospitalist service was asked to evaluate the patient and manage her medical comorbidities. During my evaluation, patient was lying comfortably in bed and appeared in no distress. She had no major complaints except for pain in her left foot area.  Recommendations 1. Ischemic foot: s/p angiography 3/17, off heparin gtt. Has significant pain with ambulation. Is currently on Percocet and is taking it about 2 pills three times daily although it is scheduled every 4 hours. Will change the Percocet to every 8 hours to match exactly what she is taking and add short acting oxycodone to use before working with PT and in that way minimize acetaminophen. I discussed to the nursing staff to administer short acting pain medication before working with PT today to help with ambulation and determine home regimen.  2. Acute encephalopathy - onset after the procedure, may be related to sedation.  Husband endorses that she has had confusion after sedation in the past. Resolved today. 3. ?UTI - culture negative. OK to treat for 3 days.   4. Acute on chronic renal failure - weight down, euvolemic and on room air. Can hold lasix and monitor for today. She has excellent po intake and encouraged hydration today. S/p angiography 3/17. 5. Chronic combined systolic and diastolic heart failure:  - Appears clinically compensated, lungs are clear, she does not have any significant peripheral edema.  - 2-D echocardiogram on 05/31/12 showed EF around 40-45% with diffuse hypokinesis.  - Holding 180 mg of Lasix twice a day and 5 mg of metolazone as needed. She currently looks euvolemic. Weight have been stable since admission and most recent weight is 209 on 3/18, down from 212 on 3/15 so she might be on the dry side. Continue with labetalol, Imdur and hydralazine. Home weight - per patient around 212. Continue to monitor and hold Lasix, repeat BMP tomorrow morning for renal function and restart Lasix then. 6. COPD: Lungs are currently clear. We'll start some nebulized bronchodilators via inpatient-particularly if she is going to need vascular surgery or amputation. Will add incentive spirometry. Is on O2 here however appears that this is for comfort and she did not have desatting events. Will need to be weaned off before d/c. 7. OSA: Apparently is on CPAP at home. Continue that here.  8. Diabetes: Currently on SSI-sugars mostly uncontrolled. Will continue to adjust more so today, Lantus 35 and Aspart 5 with meals PLUS sliding scale. Fasting still high but better than yesterday, and I suspect that we need to catch up with mealtime insulin. Change Aspart to 5  TID.  9. Peripheral neuropathy- continue with Neurontin 10. Hypertension- uncontrolled. Will increase Labetalol. Can also be pain related.  11. History of CAD- no chest pain or shortness of breath. Continue with aspirin, statin and beta blockers. 12. GERD-  continue PPI 13.  Preop assessment - doubt she needs further workup prior to any further surgical procedures, however if she were to require bypass-then we probably will need cardiology for preop assessment as well   DVT Prophylaxis: -  Lovenox  Code Status: - Full code Hospitalist service will followup again tomorrow. Please contact me if I can be of assistance in the meanwhile. Thank you for this consultation.  PHYSICAL EXAM: Blood pressure 174/67, pulse 97, temperature 98.8 F (37.1 C), temperature source Oral, resp. rate 18, height 5\' 6"  (1.676 m), weight 94.9 kg (209 lb 3.5 oz), SpO2 94.00%.  General appearance :Awake, alert, not in any distress. Speech Clear. Not toxic Looking HEENT: Atraumatic and Normocephalic, pupils equally reactive to light and accomodation Neck: supple, no JVD. No cervical lymphadenopathy.  Chest:Good air entry bilaterally, no added sounds, no crackles CVS: S1 S2 regular, no murmurs.  Abdomen: Bowel sounds present, Non tender and not distended with no gaurding, rigidity or rebound. Extremities: B/L Lower Ext shows no edema, right lower extremity is warm to touch, left lower extremity is cold to touch from her left lower third of the leg  Neurology: Awake alert, and oriented to time and person not place, CN II-XII intact, Non focal Skin:No rashes  LABS:  Recent Labs  08/31/12 1408 09/01/12 1133  NA  --  136  K  --  4.8  CL  --  96  CO2  --  27  GLUCOSE  --  344*  BUN  --  40*  CREATININE 1.85* 2.08*  CALCIUM  --  9.0    Recent Labs  08/31/12 1408 09/01/12 1133  WBC 11.3* 18.1*  HGB 13.0 13.4  HCT 40.0 39.5  MCV 86.8 87.4  PLT 232 245   RADIOLOGIC STUDIES: No results found.  Pamella Pert [facility] Pager [phone] If 7PM-7AM, please contact night-coverage [email or web address] Password [facility] 09/02/2012, 11:50 AM

## 2012-09-02 NOTE — Progress Notes (Signed)
Pt assisted from bed to chair by RN and NT. Pt unable to bear any weight on left foot, and could not tolerate much weight on right foot. Pt comfortable, resting in chair. Will continue to monitor.

## 2012-09-02 NOTE — Progress Notes (Signed)
Pt HR got up into the 140's while sleeping, but did not sustain. HR back down in the 70's-80's, pt asymptomatic. Will continue to monitor.

## 2012-09-02 NOTE — Progress Notes (Signed)
VASCULAR PROGRESS NOTE  SUBJECTIVE: Currently, denies significant pain in her foot.  PHYSICAL EXAM: Filed Vitals:   09/01/12 1350 09/01/12 1521 09/01/12 2126 09/02/12 0335  BP: 163/67 157/77 151/71 160/86  Pulse: 87  98 86  Temp: 99 F (37.2 C)  99.4 F (37.4 C) 98.8 F (37.1 C)  TempSrc: Oral  Oral Oral  Resp: 19  18 18   Height:      Weight:      SpO2: 99%  97% 94%   Left foot is ischemic with decreased motor and sensory function.   LABS: Lab Results  Component Value Date   WBC 18.1* 09/01/2012   HGB 13.4 09/01/2012   HCT 39.5 09/01/2012   MCV 87.4 09/01/2012   PLT 245 09/01/2012   Lab Results  Component Value Date   CREATININE 2.08* 09/01/2012   Lab Results  Component Value Date   INR 0.94 08/29/2012   CBG (last 3)   Recent Labs  09/01/12 1625 09/01/12 2131 09/02/12 0558  GLUCAP 220* 244* 261*    Principal Problem:   Ischemic foot Active Problems:   DM (diabetes mellitus), type 2, uncontrolled, with renal complications   HYPERLIPIDEMIA   HYPERTENSION   CORONARY ARTERY DISEASE   Chronic combined systolic and diastolic heart failure   PERIPHERAL VASCULAR DISEASE   COPD   GERD   ASSESSMENT AND PLAN: 1. 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  2. If able to put weight on foot and ambulate, she could go home and I can follow this as an outpt. However, if not, she will need left BKA if she is agreeable.  3. D/c foley 4. Appreciate Hospitalists help with management of multiple medical co morbidities.  5. DVT prophylaxis: Lovenox  Cari Caraway Beeper: [phone] 09/02/2012

## 2012-09-02 NOTE — Progress Notes (Signed)
Notified that pt had 5 beat run of V Tach, pt asymptomatic. Will continue to monitor closely.

## 2012-09-02 NOTE — Clinical Social Work Note (Signed)
CSW following for dc planning. CSW provided SNF list to spouse for review. Pt's insurance in not likely to approve SNF if pt is not able to participate in therapy.  CSW discussed this issue with Pt's spouse. Pt is not alert enough to participate in dc planning. Pt moaning in pain in recliner. Pt's spouse reports that he is unsure of next step in Pt's treatment. CSW will f/u in the a.m to see if Pt is able to do more with therapy.    Frederico Hamman, LCSW [phone]

## 2012-09-02 NOTE — Plan of Care (Signed)
Problem: Phase I Progression Outcomes Goal: Distal pulses equal to baseline Outcome: Not Met (add Reason) Pulses absent-MD aware

## 2012-09-03 LAB — SURGICAL PCR SCREEN: Staphylococcus aureus: NEGATIVE

## 2012-09-03 LAB — BASIC METABOLIC PANEL
BUN: 53 mg/dL — ABNORMAL HIGH (ref 6–23)
CO2: 28 mEq/L (ref 19–32)
Calcium: 9.4 mg/dL (ref 8.4–10.5)
Chloride: 96 mEq/L (ref 96–112)
Chloride: 94 mEq/L — ABNORMAL LOW (ref 96–112)
Glucose, Bld: 189 mg/dL — ABNORMAL HIGH (ref 70–99)
Glucose, Bld: 179 mg/dL — ABNORMAL HIGH (ref 70–99)
Potassium: 4.5 mEq/L (ref 3.5–5.1)
Potassium: 4.1 mEq/L (ref 3.5–5.1)
Sodium: 135 mEq/L (ref 135–145)
Sodium: 135 mEq/L (ref 135–145)

## 2012-09-03 LAB — GLUCOSE, CAPILLARY
Glucose-Capillary: 190 mg/dL — ABNORMAL HIGH (ref 70–99)
Glucose-Capillary: 227 mg/dL — ABNORMAL HIGH (ref 70–99)
Glucose-Capillary: 175 mg/dL — ABNORMAL HIGH (ref 70–99)
Glucose-Capillary: 235 mg/dL — ABNORMAL HIGH (ref 70–99)

## 2012-09-03 MED ORDER — FUROSEMIDE 80 MG PO TABS
160.0000 mg | ORAL_TABLET | Freq: Two times a day (BID) | ORAL | Status: DC
  Administered 2012-09-03 (×2): 160 mg via ORAL
  Filled 2012-09-03 (×5): qty 2

## 2012-09-03 MED ORDER — CEFAZOLIN SODIUM 1-5 GM-% IV SOLN
1.0000 g | INTRAVENOUS | Status: DC
  Filled 2012-09-03: qty 50

## 2012-09-03 MED ORDER — INSULIN GLARGINE 100 UNIT/ML ~~LOC~~ SOLN
17.5000 [IU] | Freq: Once | SUBCUTANEOUS | Status: AC
  Administered 2012-09-03: 18 [IU] via SUBCUTANEOUS
  Filled 2012-09-03: qty 0.18

## 2012-09-03 NOTE — Progress Notes (Addendum)
[facility] Progress Note 09/03/2012 8:47 AM  Subjective:  Having more pain in left foot today  Afebrile x 24 hrs VSS 100% RA  Filed Vitals:   09/03/12 0413  BP: 150/81  Pulse: 74  Temp: 98.5 F (36.9 C)  Resp: 16   Physical Exam: Extremities:  Left foot and leg are cool to touch to mid calf.  Also tender to touch.  CBC    Component Value Date/Time   WBC 18.1* 09/01/2012 1133   RBC 4.52 09/01/2012 1133   HGB 13.4 09/01/2012 1133   HCT 39.5 09/01/2012 1133   PLT 245 09/01/2012 1133   MCV 87.4 09/01/2012 1133   MCH 29.6 09/01/2012 1133   MCHC 33.9 09/01/2012 1133   RDW 14.0 09/01/2012 1133   LYMPHSABS 1.1 06/19/2012 1403   MONOABS 0.5 06/19/2012 1403   EOSABS 0.1 06/19/2012 1403   BASOSABS 0.0 06/19/2012 1403   BMET    Component Value Date/Time   NA 135 09/03/2012 0535   K 4.5 09/03/2012 0535   CL 96 09/03/2012 0535   CO2 28 09/03/2012 0535   GLUCOSE 189* 09/03/2012 0535   BUN 53* 09/03/2012 0535   CREATININE 2.39* 09/03/2012 0535   CALCIUM 9.2 09/03/2012 0535   CALCIUM 9.8 02/20/2012 1018   GFRNONAA 18* 09/03/2012 0535   GFRAA 21* 09/03/2012 0535  INR    Component Value Date/Time   INR 0.94 08/29/2012 0900    Intake/Output Summary (Last 24 hours) at 09/03/12 0847 Last data filed at 09/03/12 0700  Gross per 24 hour  Intake    360 ml  Output    775 ml  Net   -415 ml     Assessment/Plan:  78 y.o. female is s/p: Aortogram.   -pt having more pain in her left foot today-unable to bear weight to left foot.  She is a 3 person lift at this time. -pt will need left BKA  Doreatha Massed, PA-C [facility] [phone] 09/03/2012 8:47 AM  Agree with above. The pt has developed progressive ischemia of the left foot. She has severe tibial artery occlusive disease with no options for revascularization. She is now agreeable to proceed with left BKA. I have reviewed the indications for surgery and the potential complications including a 15% risk of  non-healing in which case she would require an AKA. All questions answered and she is agreeable to proceed.   Waverly Ferrari, MD, FACS Beeper [phone] 09/03/2012

## 2012-09-03 NOTE — Progress Notes (Signed)
Physical Therapy Treatment and D/C Patient Details Name: [name] MRN: [id number] DOB: 1930-02-17 Today's Date: 09/03/2012 Time: 4098-1191 PT Time Calculation (min): 24 min  PT Assessment / Plan / Recommendation Comments on Treatment Session  Pt s/p ischemic left foot.  Pt motivated to get OOB.  Able to sit to EOB with pain bil feet (L worse than R).  Pt stood and pivoted with pain bil feet.  Will continue PT as able.  As PT writing this note, heard MD scheduling Left BKA for pt.  Will sign off and will need reorder for PT post surgery.  Goals unmet due to surgery. Thanks.  D/C PT.      Follow Up Recommendations  Other (comment) (TBA after surgery)                 Equipment Recommendations  Other (comment) (TBA after surgery)        Frequency     Plan Discharge plan needs to be updated    Precautions / Restrictions Precautions Precautions: Fall Restrictions Weight Bearing Restrictions: No Other Position/Activity Restrictions: pt self-selects NWB Lt foot due to pain   Pertinent Vitals/Pain VSS, INCR pain with movement Left foot    Mobility  Bed Mobility Bed Mobility: Rolling Right;Right Sidelying to Sit;Sitting - Scoot to [place] of Bed Rolling Right: 3: Mod assist;With rail Rolling Left: Not tested (comment) Right Sidelying to Sit: 3: Mod assist;With rails;HOB elevated Left Sidelying to Sit: Not tested (comment) Sitting - Scoot to Edge of Bed: 3: Mod assist Sit to Sidelying Left: Not Tested (comment) Details for Bed Mobility Assistance: pt able to move LLE over EOB herself with incr time (did not want anyone to touch it); assist to raise torso to upright sitting; used pad to scoot pt to EOB Transfers Transfers: Sit to Stand;Stand to Sit;Stand Pivot Transfers Sit to Stand: 1: +2 Total assist;With upper extremity assist;From elevated surface;From bed Sit to Stand: Patient Percentage: 50% Stand to Sit: 1: +2 Total assist;With upper extremity assist;With armrests;To  chair/3-in-1 Stand to Sit: Patient Percentage: 50% Stand Pivot Transfers: 1: +2 Total assist Stand Pivot Transfers: Patient Percentage: 60% Details for Transfer Assistance: Pt needed cues for hand placement.  Pt needs a lot of asssist to lean forward and get "nose over toes".  Pt able to stand with assist but needed constant assist for weight bearing for upright stance as pt having difficulty acheiving full hip extension therefore leaning slightly posteriorly needing greater assist.  Pt needed asssit and cues for pivot transfer using RW as she was able to weight shift with cues and move LEs in order to turn and sit in chair  Pt refused socks secondary to pain therefore transfer done without socks on.   Ambulation/Gait Ambulation/Gait Assistance: Not tested (comment) Stairs: No Wheelchair Mobility Wheelchair Mobility: No    PT Goals Acute Rehab PT Goals PT Goal: Rolling Supine to Right Side - Progress: Not met PT Goal: Rolling Supine to Left Side - Progress: Not met PT Goal: Supine/Side to Sit - Progress: Not met PT Goal: Sit at Edge Of Bed - Progress: Not met PT Goal: Sit to Supine/Side - Progress: Not met PT Goal: Sit to Stand - Progress: Not met PT Transfer Goal: Bed to Chair/Chair to Bed - Progress: Not met PT Goal: Perform Home Exercise Program - Progress: Not met  Visit Information  Last PT Received On: 09/03/12 Assistance Needed: +2    Subjective Data  Subjective: "I am feeling a little better."  Cognition  Cognition Overall Cognitive Status: Impaired Area of Impairment: Memory Arousal/Alertness: Awake/alert Orientation Level: Disoriented to;Place;Time Behavior During Session: WFL for tasks performed    Balance  Static Sitting Balance Static Sitting - Balance Support: Bilateral upper extremity supported;Feet unsupported Static Sitting - Level of Assistance: 5: Stand by assistance Static Sitting - Comment/# of Minutes: 3 minutes at EOB Static Standing Balance Static  Standing - Balance Support: Bilateral upper extremity supported;During functional activity Static Standing - Level of Assistance: 3: Mod assist Static Standing - Comment/# of Minutes: Needs mod assist for stability standing with RW.  End of Session PT - End of Session Equipment Utilized During Treatment: Gait belt Activity Tolerance: Patient limited by fatigue;Patient limited by pain Patient left: in chair;with call bell/phone within reach;with family/visitor present Nurse Communication: Mobility status;Need for lift equipment        [name],Christina Aronov 09/03/2012, 1:50 PM [facility] [phone] (pager)

## 2012-09-03 NOTE — Consult Note (Signed)
[facility]  PATIENT DETAILS Name: JOSEPH JOHNS Age: 79 y.o. Sex: female Date of Birth: 03-15-1930 Admit Date: 08/29/2012 ZOX:WRUEAVW, Gregary Signs, MD Requesting MD: Chuck Hint, MD  Date of consultation: 08/30/12  REASON FOR CONSULTATION:  Management of medical comorbidities  Impression Principal Problem:   Ischemic foot Active Problems:   DM (diabetes mellitus), type 2, uncontrolled, with renal complications   HYPERLIPIDEMIA   HYPERTENSION   CORONARY ARTERY DISEASE   Chronic combined systolic and diastolic heart failure   PERIPHERAL VASCULAR DISEASE   COPD   GERD   OSA  Narrative: Patient is a 79 year old African American female with significant past medical history of diabetes, hypertension, known peripheral as the disease, known coronary artery disease, combined systolic and diastolic dysfunction who presented with worsening pain in her left lower extremity. In the ED she was found to have a cold left foot, she was seen then by vascular surgery and admitted to their service. Hospitalist service was asked to evaluate the patient and manage her medical comorbidities. During my evaluation, patient was lying comfortably in bed and appeared in no distress. She had no major complaints except for pain in her left foot area.  Recommendations 1. Ischemic foot: s/p angiography 3/17, off heparin gtt. Has significant pain with ambulation. Is currently on Percocet and is taking it about 2 pills three times daily although it is scheduled every 4 hours. Will change the Percocet to every 8 hours to match exactly what she is taking and add short acting oxycodone to use before working with PT and in that way minimize acetaminophen. I discussed to the nursing staff to administer short acting pain medication before working with PT today to help with ambulation and determine home regimen.   - continues to have pain despite increasing pain regimen  - plan for left BKA  per surgery. 2. Acute encephalopathy - onset after the procedure, may be related to sedation. Husband endorses that she has had confusion after sedation in the past. Resolved. 3. ?UTI - culture negative. OK to treat for 3 days.   4. Acute on chronic renal failure - S/p angiography 3/17.  - worsening renal function. Back on Lasix today, if worsening more she will need renal consult.  5. Chronic combined systolic and diastolic heart failure:   - Appears clinically compensated, lungs are clear, she does not have any significant peripheral edema.  6. COPD: Lungs are currently clear. We'll start some nebulized bronchodilators via inpatient-particularly if she is going to need vascular surgery or amputation. Will add incentive spirometry. Is on O2 here however appears that this is for comfort and she did not have desatting events. Will need to be weaned off before d/c. 7. OSA: Apparently is on CPAP at home. Continue that here.  8. Diabetes: Currently on SSI-sugars mostly uncontrolled. Will continue to adjust more so today, Lantus 35 and Aspart 5 with meals PLUS sliding scale. Fasting still high but better than yesterday, and I suspect that we need to catch up with mealtime insulin. Change Aspart to 5 TID.  9. Peripheral neuropathy- continue with Neurontin 10. Hypertension- uncontrolled. Will increase Labetalol. Can also be pain related.  11. History of CAD- no chest pain or shortness of breath. Continue with aspirin, statin and beta blockers. 12. GERD- continue PPI 13.  Preop assessment - doubt she needs further workup prior to any further surgical procedures, however if she were to require bypass-then we probably will need cardiology for preop assessment as well  DVT Prophylaxis: -  Lovenox  Code Status: - Full code Hospitalist service will followup again tomorrow. Please contact me if I can be of assistance in the meanwhile. Thank you for this consultation.  PHYSICAL EXAM: Blood pressure 150/81,  pulse 74, temperature 98.5 F (36.9 C), temperature source Oral, resp. rate 16, height 5\' 6"  (1.676 m), weight 94.9 kg (209 lb 3.5 oz), SpO2 100.00%.  General appearance :Awake, alert, not in any distress. Speech Clear. Not toxic Looking HEENT: Atraumatic and Normocephalic, pupils equally reactive to light and accomodation Neck: supple, no JVD. No cervical lymphadenopathy.  Chest:Good air entry bilaterally, no added sounds, no crackles CVS: S1 S2 regular, no murmurs.  Abdomen: Bowel sounds present, Non tender and not distended with no gaurding, rigidity or rebound. Extremities: B/L Lower Ext shows no edema, right lower extremity is warm to touch, left lower extremity is cold to touch from her left lower third of the leg  Neurology: Awake alert, and oriented to time and person not place, CN II-XII intact, Non focal Skin:No rashes  LABS:  Recent Labs  09/01/12 1133 09/03/12 0535  NA 136 135  K 4.8 4.5  CL 96 96  CO2 27 28  GLUCOSE 344* 189*  BUN 40* 53*  CREATININE 2.08* 2.39*  CALCIUM 9.0 9.2    Recent Labs  08/31/12 1408 09/01/12 1133  WBC 11.3* 18.1*  HGB 13.0 13.4  HCT 40.0 39.5  MCV 86.8 87.4  PLT 232 245   RADIOLOGIC STUDIES: No results found.  Pamella Pert [facility] Pager [phone] If 7PM-7AM, please contact night-coverage [email or web address] Password [facility] 09/03/2012, 9:48 AM

## 2012-09-04 ENCOUNTER — Encounter (HOSPITAL_COMMUNITY)
Admission: EM | Disposition: A | Discharge status: Skilled Nursing Facility | Payer: Self-pay | Source: Home / Self Care | Attending: Vascular Surgery

## 2012-09-04 ENCOUNTER — Encounter (HOSPITAL_COMMUNITY): Payer: Self-pay | Admitting: Anesthesiology

## 2012-09-04 ENCOUNTER — Encounter: Payer: Self-pay | Admitting: Vascular Surgery

## 2012-09-04 ENCOUNTER — Inpatient Hospital Stay (HOSPITAL_COMMUNITY): Payer: Medicare Other | Admitting: Anesthesiology

## 2012-09-04 DIAGNOSIS — N179 Acute kidney failure, unspecified: Secondary | ICD-10-CM | POA: Diagnosis not present

## 2012-09-04 HISTORY — PX: AMPUTATION: SHX166

## 2012-09-04 LAB — CBC
HCT: 39.8 % (ref 36.0–46.0)
Hemoglobin: 13.3 g/dL (ref 12.0–15.0)
RBC: 4.63 MIL/uL (ref 3.87–5.11)
WBC: 15.2 10*3/uL — ABNORMAL HIGH (ref 4.0–10.5)

## 2012-09-04 LAB — GLUCOSE, CAPILLARY
Glucose-Capillary: 209 mg/dL — ABNORMAL HIGH (ref 70–99)
Glucose-Capillary: 180 mg/dL — ABNORMAL HIGH (ref 70–99)
Glucose-Capillary: 199 mg/dL — ABNORMAL HIGH (ref 70–99)
Glucose-Capillary: 163 mg/dL — ABNORMAL HIGH (ref 70–99)
Glucose-Capillary: 190 mg/dL — ABNORMAL HIGH (ref 70–99)

## 2012-09-04 LAB — CREATININE, SERUM
Creatinine, Ser: 2.74 mg/dL — ABNORMAL HIGH (ref 0.50–1.10)
GFR calc Af Amer: 18 mL/min — ABNORMAL LOW (ref >90)
GFR calc non Af Amer: 15 mL/min — ABNORMAL LOW (ref >90)

## 2012-09-04 SURGERY — AMPUTATION BELOW KNEE
Anesthesia: General | Site: Leg Lower | Laterality: Left | Wound class: Clean

## 2012-09-04 MED ORDER — CEFAZOLIN SODIUM-DEXTROSE 2-3 GM-% IV SOLR
2.0000 g | INTRAVENOUS | Status: AC
  Administered 2012-09-04: 2 g via INTRAVENOUS
  Filled 2012-09-04: qty 50

## 2012-09-04 MED ORDER — ONDANSETRON HCL 4 MG/2ML IJ SOLN: 4.0000 mg | Freq: Once | INTRAMUSCULAR | Status: DC | PRN

## 2012-09-04 MED ORDER — SODIUM CHLORIDE 0.9 % IV SOLN
INTRAVENOUS | Status: DC | PRN
  Administered 2012-09-04 (×2): via INTRAVENOUS

## 2012-09-04 MED ORDER — BISACODYL 10 MG RE SUPP
10.0000 mg | Freq: Every day | RECTAL | Status: DC | PRN
  Administered 2012-09-09: 10 mg via RECTAL
  Filled 2012-09-04: qty 1

## 2012-09-04 MED ORDER — ONDANSETRON HCL 4 MG/2ML IJ SOLN: 4.0000 mg | Freq: Four times a day (QID) | INTRAMUSCULAR | Status: DC | PRN

## 2012-09-04 MED ORDER — METOPROLOL TARTRATE 1 MG/ML IV SOLN: 2.0000 mg | INTRAVENOUS | Status: DC | PRN

## 2012-09-04 MED ORDER — ONDANSETRON HCL 4 MG/2ML IJ SOLN
INTRAMUSCULAR | Status: DC | PRN
  Administered 2012-09-04: 4 mg via INTRAVENOUS

## 2012-09-04 MED ORDER — PANTOPRAZOLE SODIUM 40 MG PO TBEC
40.0000 mg | DELAYED_RELEASE_TABLET | Freq: Every day | ORAL | Status: DC
  Administered 2012-09-04 – 2012-09-09 (×5): 40 mg via ORAL
  Filled 2012-09-04 (×6): qty 1

## 2012-09-04 MED ORDER — LABETALOL HCL 5 MG/ML IV SOLN
10.0000 mg | INTRAVENOUS | Status: DC | PRN
  Administered 2012-09-07: 10 mg via INTRAVENOUS
  Filled 2012-09-04: qty 4

## 2012-09-04 MED ORDER — ALUM & MAG HYDROXIDE-SIMETH 200-200-20 MG/5ML PO SUSP: 15.0000 mL | ORAL | Status: DC | PRN

## 2012-09-04 MED ORDER — HYDROMORPHONE HCL PF 1 MG/ML IJ SOLN: 0.2500 mg | INTRAMUSCULAR | Status: DC | PRN

## 2012-09-04 MED ORDER — PHENOL 1.4 % MT LIQD
1.0000 | OROMUCOSAL | Status: DC | PRN
  Filled 2012-09-04: qty 177

## 2012-09-04 MED ORDER — SODIUM CHLORIDE 0.9 % IV SOLN
INTRAVENOUS | Status: DC
  Administered 2012-09-04 – 2012-09-07 (×4): via INTRAVENOUS
  Administered 2012-09-07: 75 mL/h via INTRAVENOUS
  Administered 2012-09-08 (×2): via INTRAVENOUS

## 2012-09-04 MED ORDER — DOCUSATE SODIUM 100 MG PO CAPS
100.0000 mg | ORAL_CAPSULE | Freq: Every day | ORAL | Status: DC
  Administered 2012-09-05 – 2012-09-09 (×5): 100 mg via ORAL
  Filled 2012-09-04 (×5): qty 1

## 2012-09-04 MED ORDER — SUCCINYLCHOLINE CHLORIDE 20 MG/ML IJ SOLN
INTRAMUSCULAR | Status: DC | PRN
  Administered 2012-09-04: 120 mg via INTRAVENOUS

## 2012-09-04 MED ORDER — ACETAMINOPHEN 10 MG/ML IV SOLN: 1000.0000 mg | Freq: Once | INTRAVENOUS | Status: DC | PRN

## 2012-09-04 MED ORDER — HYDRALAZINE HCL 20 MG/ML IJ SOLN
10.0000 mg | INTRAMUSCULAR | Status: DC | PRN
  Filled 2012-09-04: qty 0.5

## 2012-09-04 MED ORDER — ACETAMINOPHEN 325 MG PO TABS
325.0000 mg | ORAL_TABLET | ORAL | Status: DC | PRN
  Administered 2012-09-05 – 2012-09-06 (×2): 650 mg via ORAL
  Filled 2012-09-04 (×2): qty 2

## 2012-09-04 MED ORDER — GUAIFENESIN-DM 100-10 MG/5ML PO SYRP: 15.0000 mL | ORAL_SOLUTION | ORAL | Status: DC | PRN

## 2012-09-04 MED ORDER — ACETAMINOPHEN 650 MG RE SUPP: 325.0000 mg | RECTAL | Status: DC | PRN

## 2012-09-04 MED ORDER — SENNOSIDES-DOCUSATE SODIUM 8.6-50 MG PO TABS
1.0000 | ORAL_TABLET | Freq: Every evening | ORAL | Status: DC | PRN
  Filled 2012-09-04: qty 1

## 2012-09-04 MED ORDER — OXYCODONE HCL 5 MG PO TABS
5.0000 mg | ORAL_TABLET | ORAL | Status: DC | PRN
  Administered 2012-09-04 – 2012-09-08 (×8): 10 mg via ORAL
  Filled 2012-09-04 (×8): qty 2

## 2012-09-04 MED ORDER — MAGNESIUM SULFATE 40 MG/ML IJ SOLN
2.0000 g | Freq: Once | INTRAMUSCULAR | Status: AC | PRN
  Filled 2012-09-04: qty 50

## 2012-09-04 MED ORDER — DOUBLE ANTIBIOTIC 500-10000 UNIT/GM EX OINT
TOPICAL_OINTMENT | CUTANEOUS | Status: AC
  Filled 2012-09-04: qty 1

## 2012-09-04 MED ORDER — HYDROMORPHONE HCL PF 1 MG/ML IJ SOLN
0.5000 mg | INTRAMUSCULAR | Status: DC | PRN
  Administered 2012-09-05 – 2012-09-07 (×10): 1 mg via INTRAVENOUS
  Filled 2012-09-04 (×10): qty 1

## 2012-09-04 MED ORDER — DEXTROSE 5 % IV SOLN
1.5000 g | Freq: Two times a day (BID) | INTRAVENOUS | Status: AC
  Administered 2012-09-04 – 2012-09-05 (×2): 1.5 g via INTRAVENOUS
  Filled 2012-09-04 (×2): qty 1.5

## 2012-09-04 MED ORDER — FENTANYL CITRATE 0.05 MG/ML IJ SOLN
INTRAMUSCULAR | Status: DC | PRN
  Administered 2012-09-04: 50 ug via INTRAVENOUS
  Administered 2012-09-04: 100 ug via INTRAVENOUS

## 2012-09-04 MED ORDER — POTASSIUM CHLORIDE CRYS ER 20 MEQ PO TBCR: 20.0000 meq | EXTENDED_RELEASE_TABLET | Freq: Once | ORAL | Status: AC | PRN

## 2012-09-04 MED ORDER — LIDOCAINE HCL (CARDIAC) 20 MG/ML IV SOLN
INTRAVENOUS | Status: DC | PRN
  Administered 2012-09-04: 50 mg via INTRAVENOUS

## 2012-09-04 MED ORDER — PROPOFOL 10 MG/ML IV BOLUS
INTRAVENOUS | Status: DC | PRN
  Administered 2012-09-04: 50 mg via INTRAVENOUS
  Administered 2012-09-04: 150 mg via INTRAVENOUS

## 2012-09-04 SURGICAL SUPPLY — 49 items
BANDAGE ELASTIC 4 VELCRO ST LF (GAUZE/BANDAGES/DRESSINGS) ×2 IMPLANT
BANDAGE ELASTIC 6 VELCRO ST LF (GAUZE/BANDAGES/DRESSINGS) ×2 IMPLANT
BANDAGE ESMARK 6X9 LF (GAUZE/BANDAGES/DRESSINGS) ×1 IMPLANT
BANDAGE GAUZE ELAST BULKY 4 IN (GAUZE/BANDAGES/DRESSINGS) ×3 IMPLANT
BLADE SAW RECIP 87.9 MT (BLADE) ×2 IMPLANT
BNDG CMPR 9X6 STRL LF SNTH (GAUZE/BANDAGES/DRESSINGS) ×1
BNDG COHESIVE 6X5 TAN STRL LF (GAUZE/BANDAGES/DRESSINGS) ×2 IMPLANT
BNDG ESMARK 6X9 LF (GAUZE/BANDAGES/DRESSINGS) ×2
CANISTER SUCTION 2500CC (MISCELLANEOUS) ×2 IMPLANT
CLIP TI MEDIUM 6 (CLIP) ×1 IMPLANT
CLOTH BEACON ORANGE TIMEOUT ST (SAFETY) ×2 IMPLANT
COVER SURGICAL LIGHT HANDLE (MISCELLANEOUS) ×2 IMPLANT
CUFF TOURNIQUET SINGLE 24IN (TOURNIQUET CUFF) IMPLANT
CUFF TOURNIQUET SINGLE 34IN LL (TOURNIQUET CUFF) ×1 IMPLANT
CUFF TOURNIQUET SINGLE 44IN (TOURNIQUET CUFF) IMPLANT
DRAIN CHANNEL 19F RND (DRAIN) IMPLANT
DRAPE ORTHO SPLIT 77X108 STRL (DRAPES) ×4
DRAPE PROXIMA HALF (DRAPES) ×1 IMPLANT
DRAPE SURG ORHT 6 SPLT 77X108 (DRAPES) ×2 IMPLANT
DRAPE U-SHAPE 47X51 STRL (DRAPES) ×2 IMPLANT
DRSG ADAPTIC 3X8 NADH LF (GAUZE/BANDAGES/DRESSINGS) ×2 IMPLANT
ELECT REM PT RETURN 9FT ADLT (ELECTROSURGICAL) ×2
ELECTRODE REM PT RTRN 9FT ADLT (ELECTROSURGICAL) ×1 IMPLANT
EVACUATOR SILICONE 100CC (DRAIN) IMPLANT
GLOVE BIO SURGEON STRL SZ7.5 (GLOVE) ×2 IMPLANT
GLOVE BIOGEL PI IND STRL 8 (GLOVE) ×1 IMPLANT
GLOVE BIOGEL PI INDICATOR 8 (GLOVE) ×1
GOWN STRL NON-REIN LRG LVL3 (GOWN DISPOSABLE) ×5 IMPLANT
KIT BASIN OR (CUSTOM PROCEDURE TRAY) ×2 IMPLANT
KIT ROOM TURNOVER OR (KITS) ×2 IMPLANT
NS IRRIG 1000ML POUR BTL (IV SOLUTION) ×2 IMPLANT
PACK GENERAL/GYN (CUSTOM PROCEDURE TRAY) ×2 IMPLANT
PAD ARMBOARD 7.5X6 YLW CONV (MISCELLANEOUS) ×4 IMPLANT
PADDING CAST COTTON 6X4 STRL (CAST SUPPLIES) ×2 IMPLANT
SPONGE GAUZE 4X4 12PLY (GAUZE/BANDAGES/DRESSINGS) ×3 IMPLANT
STAPLER VISISTAT 35W (STAPLE) ×2 IMPLANT
STOCKINETTE IMPERVIOUS LG (DRAPES) ×2 IMPLANT
SUT ETHILON 3 0 PS 1 (SUTURE) IMPLANT
SUT SILK 0 TIES 10X30 (SUTURE) ×2 IMPLANT
SUT SILK 2 0 (SUTURE) ×2
SUT SILK 2 0 SH CR/8 (SUTURE) ×2 IMPLANT
SUT SILK 2-0 18XBRD TIE 12 (SUTURE) ×1 IMPLANT
SUT SILK 3 0 (SUTURE) ×2
SUT SILK 3-0 18XBRD TIE 12 (SUTURE) ×1 IMPLANT
SUT VIC AB 2-0 CT1 18 (SUTURE) ×3 IMPLANT
TOWEL OR 17X24 6PK STRL BLUE (TOWEL DISPOSABLE) ×2 IMPLANT
TOWEL OR 17X26 10 PK STRL BLUE (TOWEL DISPOSABLE) ×2 IMPLANT
UNDERPAD 30X30 INCONTINENT (UNDERPADS AND DIAPERS) ×2 IMPLANT
WATER STERILE IRR 1000ML POUR (IV SOLUTION) ×2 IMPLANT

## 2012-09-04 NOTE — Anesthesia Preprocedure Evaluation (Addendum)
Anesthesia Evaluation  Patient identified by MRN, date of birth, ID band Patient awake    Reviewed: Allergy & Precautions, H&P , NPO status , Patient's Chart, lab work & pertinent test results  Airway Mallampati: II TM Distance: >3 FB     Dental  (+) Teeth Intact, Poor Dentition and Dental Advisory Given   Pulmonary asthma , sleep apnea and Continuous Positive Airway Pressure Ventilation , COPD breath sounds clear to auscultation        Cardiovascular hypertension, On Home Beta Blockers + CAD, + Peripheral Vascular Disease and +CHF Rhythm:Regular Rate:Normal     Neuro/Psych PSYCHIATRIC DISORDERS  Neuromuscular disease    GI/Hepatic hiatal hernia, PUD, GERD-  Medicated,  Endo/Other  diabetes, Well Controlled, Type 1, Insulin Dependent  Renal/GU Renal disease     Musculoskeletal   Abdominal   Peds  Hematology   Anesthesia Other Findings   Reproductive/Obstetrics                          Anesthesia Physical Anesthesia Plan  ASA: III  Anesthesia Plan: General   Post-op Pain Management:    Induction: Intravenous  Airway Management Planned: LMA  Additional Equipment:   Intra-op Plan:   Post-operative Plan:   Informed Consent: I have reviewed the patients History and Physical, chart, labs and discussed the procedure including the risks, benefits and alternatives for the proposed anesthesia with the patient or authorized representative who has indicated his/her understanding and acceptance.   Dental advisory given and Dental Advisory Given  Plan Discussed with: CRNA, Anesthesiologist and Surgeon  Anesthesia Plan Comments: (PVD gangrene L foot Longstanding Type 2 DM glucose 100 Renal insuff cr2.44  CAD CAD )       Anesthesia Quick Evaluation

## 2012-09-04 NOTE — Preoperative (Signed)
Beta Blockers   Reason not to administer Beta Blockers:Not Applicable 

## 2012-09-04 NOTE — Anesthesia Procedure Notes (Signed)
Procedure Name: Intubation Date/Time: 09/04/2012 3:34 PM Performed by: Carmela Rima Pre-anesthesia Checklist: Patient identified, Timeout performed, Emergency Drugs available, Suction available and Patient being monitored Patient Re-evaluated:Patient Re-evaluated prior to inductionOxygen Delivery Method: Circle system utilized Preoxygenation: Pre-oxygenation with 100% oxygen Intubation Type: IV induction and Rapid sequence Ventilation: Mask ventilation without difficulty Laryngoscope Size: Mac and 3 Grade View: Grade IV Tube size: 6.5 mm Number of attempts: 3 Airway Equipment and Method: Video-laryngoscopy (Anterior anatomy.  Teeth angled out.  Obese. Redundant tissue.) Placement Confirmation: ETT inserted through vocal cords under direct vision,  positive ETCO2 and breath sounds checked- equal and bilateral Secured at: 21 cm Tube secured with: Tape Dental Injury: Teeth and Oropharynx as per pre-operative assessment  Difficulty Due To: Difficulty was unanticipated

## 2012-09-04 NOTE — Anesthesia Postprocedure Evaluation (Signed)
  Anesthesia Post-op Note  Patient: [name]  Procedure(s) Performed: Procedure(s): AMPUTATION BELOW KNEE (Left)  Patient Location: PACU  Anesthesia Type:General  Level of Consciousness: awake, alert  and oriented  Airway and Oxygen Therapy: Patient Spontanous Breathing and Patient connected to nasal cannula oxygen  Post-op Pain: mild  Post-op Assessment: Post-op Vital signs reviewed, Patient's Cardiovascular Status Stable, Respiratory Function Stable, Patent Airway and Pain level controlled  Post-op Vital Signs: stable  Complications: No apparent anesthesia complications

## 2012-09-04 NOTE — Progress Notes (Addendum)
[facility] PROGRESS NOTE  CIIN BRAZZEL ZOX:[id number] DOB: January 01, 1930 DOA: 08/29/2012 PCP: Romero Belling, MD  Brief narrative 79 year old African American female with significant past medical history of diabetes, hypertension,PAD,  coronary artery disease, combined systolic and diastolic dysfunction who presented with worsening pain in her left lower extremity. In the ED she was found to have a cold left foot, she was seen then by vascular surgery and admitted to their service. Hospitalist service was asked to evaluate the patient and manage her medical comorbidities.   Assessment/Plan: Ischemia of the left foot  s/p angiography 3/17, off heparin gtt. continues to have significant pain.. In regimen adjusted -  For left BKA today  ?UTI -  Urine culture is negative. Treated with 3 days of Bactrim. Stop today.   Acute on chronic renal failure  Noted following the angiogram. Possibly related to contrast-induced nephropathy. I will hold her Lasix. Also was given Bactrim for UTI which has been discontinued today.   Chronic combined systolic and diastolic heart failure:  -  weekly compensated. High dose Lasix but given worsened her renal function I have held it today  History of COPD Continue when necessary nebs   Diabetes mellitus with peripheral neuropathy On Lantus and pre-meal aspart which I will continue. The new sliding scale insulin Continue Neurontin  Hypertension Uncontrolled likely with pain. Labetalol the close increased  History of CAD  continue aspirin, statin and beta blocker  GERD Continue PPI  OSA on CPAP   DVT Prophylaxis:  - Lovenox   Code Status: Full code Family Communication: Husband At bedside Disposition Plan: Currently inpatient      Procedures:  For left leg amputation this afternoon  Antibiotics:  None  HPI/Subjective: She continues to have pain in her left leg. For left leg amputation this afternoon  Objective: Filed  Vitals:   09/03/12 1431 09/03/12 2014 09/04/12 0405 09/04/12 1330  BP: 138/72 156/85 147/68 155/73  Pulse:  83 71 76  Temp:  99.4 F (37.4 C) 98.2 F (36.8 C) 98.9 F (37.2 C)  TempSrc:  Oral Oral Oral  Resp:  18 19 18   Height:      Weight:      SpO2:  99% 94% 92%    Intake/Output Summary (Last 24 hours) at 09/04/12 1612 Last data filed at 09/04/12 1557  Gross per 24 hour  Intake    500 ml  Output      0 ml  Net    500 ml   Filed Weights   08/29/12 1049 08/29/12 1303 09/01/12 0519  Weight: 96.163 kg (212 lb) 96.3 kg (212 lb 4.9 oz) 94.9 kg (209 lb 3.5 oz)    Exam:   General:  Elderly obese female lying in bed in no acute distress  HEENT: No pallor, moist oral mucosa  Cardiovascular: Normal S1 and S2, no murmurs rub or gallop  Respiratory: Clear to auscultation bilaterally, no added sounds  Abdomen: Soft, nontender, nondistended, bowel sounds present  Musculoskeletal: cold and  clammy left lower extremity with absent pulses. Tender to palpation over left foot and lower leg. Warm right lower extremity with good distal pulsation  CNS: AAO x3  Data Reviewed: Basic Metabolic Panel:  Recent Labs Lab 08/29/12 0900 08/31/12 1408 09/01/12 1133 09/03/12 0535 09/03/12 1730  NA 133*  --  136 135 135  K 4.1  --  4.8 4.5 4.1  CL 94*  --  96 96 94*  CO2 25  --  27 28 28  GLUCOSE 143*  --  344* 189* 179*  BUN 18  --  40* 53* 52*  CREATININE 1.12* 1.85* 2.08* 2.39* 2.44*  CALCIUM 9.5  --  9.0 9.2 9.4   Liver Function Tests: No results found for this basename: AST, ALT, ALKPHOS, BILITOT, PROT, ALBUMIN,  in the last 168 hours No results found for this basename: LIPASE, AMYLASE,  in the last 168 hours No results found for this basename: AMMONIA,  in the last 168 hours CBC:  Recent Labs Lab 08/29/12 0900 08/30/12 0057 08/31/12 0543 08/31/12 1408 09/01/12 1133  WBC 12.9* 11.4* 9.3 11.3* 18.1*  HGB 16.2* 13.2 12.2 13.0 13.4  HCT 49.2* 40.0 37.9 40.0 39.5  MCV  87.9 87.9 87.7 86.8 87.4  PLT 249 210 207 232 245   Cardiac Enzymes: No results found for this basename: CKTOTAL, CKMB, CKMBINDEX, TROPONINI,  in the last 168 hours BNP (last 3 results)  Recent Labs  05/30/12 1432 06/16/12 1254  PROBNP 3213.0* 168.0*   CBG:  Recent Labs Lab 09/03/12 2116 09/04/12 0407 09/04/12 0614 09/04/12 1117 09/04/12 1340  GLUCAP 235* 209* 232* 180* 199*    Recent Results (from the past 240 hour(s))  URINE CULTURE     Status: None   Collection Time    09/01/12  9:52 AM      Result Value Range Status   Specimen Description URINE, CATHETERIZED   Final   Special Requests NONE   Final   Culture  Setup Time 09/01/2012 10:37   Final   Colony Count NO GROWTH   Final   Culture NO GROWTH   Final   Report Status 09/02/2012 FINAL   Final  SURGICAL PCR SCREEN     Status: None   Collection Time    09/03/12  8:35 PM      Result Value Range Status   MRSA, PCR NEGATIVE  NEGATIVE Final   Staphylococcus aureus NEGATIVE  NEGATIVE Final   Comment:            The Xpert SA Assay (FDA     approved for NASAL specimens     in patients over 48 years of age),     is one component of     a comprehensive surveillance     program.  Test performance has     been validated by [employer] for patients greater     than or equal to 61 year old.     It is not intended     to diagnose infection nor to     guide or monitor treatment.     Studies: No results found.  Scheduled Meds: . aspirin EC  325 mg Oral Daily  . atorvastatin  80 mg Oral q1800  . [MAR HOLD]  ceFAZolin (ANCEF) IV  2 g Intravenous On Call  . [MAR HOLD] enoxaparin (LOVENOX) injection  30 mg Subcutaneous Q24H  . ezetimibe  10 mg Oral Daily  . [facility] HOLD] fluconazole  100 mg Oral Daily  . gabapentin  300 mg Oral QHS  . [MAR HOLD] hydrALAZINE  25 mg Oral Q8H  . [MAR HOLD] insulin aspart  0-15 Units Subcutaneous TID WC  . [MAR HOLD] insulin aspart  5 Units Subcutaneous TID WC  . [MAR HOLD] insulin  glargine  35 Units Subcutaneous QHS  . [facility] HOLD] isosorbide dinitrate  30 mg Oral BID  . [facility] HOLD] labetalol  200 mg Oral BID   Continuous Infusions:  Time spent: 25 minutes    Eddie North  [facility] Pager [phone] If 7PM-7AM, please contact night-coverage at [email or web address], password [facility] 09/04/2012, 4:12 PM  LOS: 6 days

## 2012-09-04 NOTE — H&P (View-Only) (Signed)
[facility] Progress Note 09/03/2012 8:47 AM  Subjective:  Having more pain in left foot today  Afebrile x 24 hrs VSS 100% RA  Filed Vitals:   09/03/12 0413  BP: 150/81  Pulse: 74  Temp: 98.5 F (36.9 C)  Resp: 16   Physical Exam: Extremities:  Left foot and leg are cool to touch to mid calf.  Also tender to touch.  CBC    Component Value Date/Time   WBC 18.1* 09/01/2012 1133   RBC 4.52 09/01/2012 1133   HGB 13.4 09/01/2012 1133   HCT 39.5 09/01/2012 1133   PLT 245 09/01/2012 1133   MCV 87.4 09/01/2012 1133   MCH 29.6 09/01/2012 1133   MCHC 33.9 09/01/2012 1133   RDW 14.0 09/01/2012 1133   LYMPHSABS 1.1 06/19/2012 1403   MONOABS 0.5 06/19/2012 1403   EOSABS 0.1 06/19/2012 1403   BASOSABS 0.0 06/19/2012 1403   BMET    Component Value Date/Time   NA 135 09/03/2012 0535   K 4.5 09/03/2012 0535   CL 96 09/03/2012 0535   CO2 28 09/03/2012 0535   GLUCOSE 189* 09/03/2012 0535   BUN 53* 09/03/2012 0535   CREATININE 2.39* 09/03/2012 0535   CALCIUM 9.2 09/03/2012 0535   CALCIUM 9.8 02/20/2012 1018   GFRNONAA 18* 09/03/2012 0535   GFRAA 21* 09/03/2012 0535  INR    Component Value Date/Time   INR 0.94 08/29/2012 0900    Intake/Output Summary (Last 24 hours) at 09/03/12 0847 Last data filed at 09/03/12 0700  Gross per 24 hour  Intake    360 ml  Output    775 ml  Net   -415 ml     Assessment/Plan:  79 y.o. female is s/p: Aortogram.   -pt having more pain in her left foot today-unable to bear weight to left foot.  She is a 3 person lift at this time. -pt will need left BKA  Aveah Castell, PA-C [facility] [phone] 09/03/2012 8:47 AM  Agree with above. The pt has developed progressive ischemia of the left foot. She has severe tibial artery occlusive disease with no options for revascularization. She is now agreeable to proceed with left BKA. I have reviewed the indications for surgery and the potential complications including a 15% risk of  non-healing in which case she would require an AKA. All questions answered and she is agreeable to proceed.   Christopher Dickson, MD, FACS Beeper [phone] 09/03/2012     

## 2012-09-04 NOTE — Transfer of Care (Signed)
Immediate Anesthesia Transfer of Care Note  Patient: [name]  Procedure(s) Performed: Procedure(s): AMPUTATION BELOW KNEE (Left)  Patient Location: PACU  Anesthesia Type:General  Level of Consciousness: awake and lethargic  Airway & Oxygen Therapy: Patient Spontanous Breathing and Patient connected to nasal cannula oxygen  Post-op Assessment: Report given to PACU RN and Post -op Vital signs reviewed and stable  Post vital signs: Reviewed and stable  Complications: No apparent anesthesia complications

## 2012-09-04 NOTE — Interval H&P Note (Signed)
History and Physical Interval Note:  09/04/2012 2:57 PM  [name]  has presented today for surgery, with the diagnosis of PVD WITH NONHEALING ULCER  The various methods of treatment have been discussed with the patient and family. After consideration of risks, benefits and other options for treatment, the patient has consented to  Procedure(s): AMPUTATION BELOW KNEE (Left) as a surgical intervention .  The patient's history has been reviewed, patient examined, no change in status, stable for surgery.  I have reviewed the patient's chart and labs.  Questions were answered to the patient's satisfaction.     Aerin Delany S

## 2012-09-04 NOTE — Op Note (Signed)
NAME: [name]  MRN: [id number] DOB: [birth year]    DATE OF OPERATION: 09/04/2012  PREOP DIAGNOSIS: ischemic left foot  POSTOP DIAGNOSIS: same  PROCEDURE: Left BKA  SURGEON: Di Kindle. Edilia Bo, MD, FACS  ASSIST: Lianne Cure PA  ANESTHESIA: Gen.   EBL: minimal  INDICATIONS: [name] is a 79 y.o. female who presented with an ischemic left foot. She underwent an arteriogram which showed severe tibial artery occlusive disease with no options for revascularization.  FINDINGS: the muscle appeared adequately perfused.  TECHNIQUE: The patient was taken to the operating room and received a general anesthetic. The left lower extremity was prepped and draped in the usual sterile fashion. The circumference of the limb was measured 10 cm distal to the tibial tuberosity. Two thirds of this distance was used to mark the anterior skin flap. A long posterior flap of equal length was marked.Marland Kitchen He was placed on the upper thigh. The leg was exsanguinated with an Esmarch bandage. Tourniquet was inflated to 300 mm mercury. Under tourniquet control, and the incision was carried down through the skin subcutaneous tissue muscle and fascia to the tibia and fibula which were dissected free circumferentially. The tibia was divided proximal to level skin division. The anterior aspect of the bone was beveled. The fibula was divided proximally. Arteries and veins were individually suture ligated. The tourniquet was then released. Additional hemostasis was obtained using electrocautery and 2-0 silk ties. The fascial layer was closed with interrupted 2-0 Vicryl. The skin was closed with staples. Sterile dressing was applied. The patient tolerated the procedure well and was transferred to the recovery room in stable condition. All needle and sponge counts were correct.  Waverly Ferrari, MD, FACS [facility]  DATE OF DICTATION:   09/04/2012

## 2012-09-05 DIAGNOSIS — I10 Essential (primary) hypertension: Secondary | ICD-10-CM

## 2012-09-05 LAB — CBC
MCV: 87 fL (ref 78.0–100.0)
Platelets: 340 10*3/uL (ref 150–400)
RBC: 4.53 MIL/uL (ref 3.87–5.11)
RDW: 14.4 % (ref 11.5–15.5)
WBC: 12.4 10*3/uL — ABNORMAL HIGH (ref 4.0–10.5)

## 2012-09-05 LAB — GLUCOSE, CAPILLARY
Glucose-Capillary: 212 mg/dL — ABNORMAL HIGH (ref 70–99)
Glucose-Capillary: 170 mg/dL — ABNORMAL HIGH (ref 70–99)

## 2012-09-05 LAB — BASIC METABOLIC PANEL
CO2: 26 mEq/L (ref 19–32)
Calcium: 9 mg/dL (ref 8.4–10.5)
Creatinine, Ser: 2.42 mg/dL — ABNORMAL HIGH (ref 0.50–1.10)
GFR calc Af Amer: 21 mL/min — ABNORMAL LOW (ref >90)
Sodium: 137 mEq/L (ref 135–145)

## 2012-09-05 MED ORDER — HYDRALAZINE HCL 50 MG PO TABS
50.0000 mg | ORAL_TABLET | Freq: Three times a day (TID) | ORAL | Status: DC
  Administered 2012-09-05 – 2012-09-09 (×13): 50 mg via ORAL
  Filled 2012-09-05 (×15): qty 1

## 2012-09-05 NOTE — Progress Notes (Addendum)
[facility] Progress Note  09/05/2012 9:06 AM POD 1  Subjective:  C/o soreness  Tm 99.2   160-180's systolic HR 60-80's regular 93% CPAP  Filed Vitals:   09/05/12 0512  BP: 178/80  Pulse:   Temp:   Resp:     Physical Exam: Incisions:  Bandage intact with minimal dry bloody drainage  CBC    Component Value Date/Time   WBC 12.4* 09/05/2012 0600   RBC 4.53 09/05/2012 0600   HGB 13.0 09/05/2012 0600   HCT 39.4 09/05/2012 0600   PLT 340 09/05/2012 0600   MCV 87.0 09/05/2012 0600   MCH 28.7 09/05/2012 0600   MCHC 33.0 09/05/2012 0600   RDW 14.4 09/05/2012 0600   LYMPHSABS 1.1 06/19/2012 1403   MONOABS 0.5 06/19/2012 1403   EOSABS 0.1 06/19/2012 1403   BASOSABS 0.0 06/19/2012 1403    BMET    Component Value Date/Time   NA 137 09/05/2012 0600   K 4.4 09/05/2012 0600   CL 96 09/05/2012 0600   CO2 26 09/05/2012 0600   GLUCOSE 223* 09/05/2012 0600   BUN 55* 09/05/2012 0600   CREATININE 2.42* 09/05/2012 0600   CALCIUM 9.0 09/05/2012 0600   CALCIUM 9.8 02/20/2012 1018   GFRNONAA 18* 09/05/2012 0600   GFRAA 21* 09/05/2012 0600    INR    Component Value Date/Time   INR 0.94 08/29/2012 0900     Intake/Output Summary (Last 24 hours) at 09/05/12 1610 Last data filed at 09/05/12 0300  Gross per 24 hour  Intake    500 ml  Output    675 ml  Net   -175 ml     Assessment/Plan:  79 y.o. female is s/p left below knee amputation  POD 1  -pt dressing is in tact with minimal bloody drainage on dressing. -will take down dressing tomorrow -hypertensive-will increase hydralazine from 25 mg tid to 50 mg tid   Doreatha Massed, PA-C [facility] [phone] 09/05/2012 9:06 AM           Agree with above assessment Will check BKA stump in a.m. The patient comfortable today

## 2012-09-05 NOTE — Progress Notes (Signed)
[facility] PROGRESS NOTE  CRESCENT GOTHAM ZOX:[id number] DOB: Dec 09, 1929 DOA: 08/29/2012 PCP: Romero Belling, MD  Brief narrative  79 year old African American female with significant past medical history of diabetes, hypertension,PAD, coronary artery disease, combined systolic and diastolic dysfunction who presented with worsening pain in her left lower extremity. In the ED she was found to have a cold left foot, she was seen then by vascular surgery and admitted to their service. Hospitalist service was asked to evaluate the patient and manage her medical comorbidities.   Assessment/Plan:  Ischemia of the left foot  s/p angiography 3/17, off heparin gtt. -s/p left BKA on 3/21 . Tolerated procedure well.   ?UTI -  Urine culture is negative. Treated with 3 days of Bactrim. Stop today.   Acute on chronic renal failure  Noted following the angiogram. Possibly related to contrast-induced nephropathy. I have held her lasix since 3/21. Also was given Bactrim for UTI which has been discontinued .  Chronic combined systolic and diastolic heart failure:  - weekly compensated. High dose Lasix but given worsened her renal function I have held it.  History of COPD  Continue when necessary nebs   Diabetes mellitus with peripheral neuropathy  On Lantus and pre-meal aspart which I will continue. Continue sliding scale insulin  Continue Neurontin   Hypertension  Uncontrolled likely with pain. Labetalol dose increased.  History of CAD  continue aspirin, statin and beta blocker   GERD  Continue PPI   OSA on CPAP   DVT Prophylaxis:   sq Lovenox   Code Status: Full code  Family Communication: none At bedside  Disposition Plan: Currently inpatient   Procedures:  Left BKA on 3/21  Antibiotics:  None  HPI/Subjective:  S/p left BKA on 3/21. Tolerated well.   Objective: Filed Vitals:   09/04/12 1741 09/04/12 2031 09/05/12 0432 09/05/12 0512  BP: 180/87 168/74 180/87  178/80  Pulse: 71 68 76   Temp: 96.5 F (35.8 C) 98.1 F (36.7 C) 99.2 F (37.3 C)   TempSrc:  Oral Oral   Resp: 13 16 17    Height:      Weight:      SpO2: 98% 95% 93%     Intake/Output Summary (Last 24 hours) at 09/05/12 1125 Last data filed at 09/05/12 0300  Gross per 24 hour  Intake    500 ml  Output    675 ml  Net   -175 ml   Filed Weights   08/29/12 1049 08/29/12 1303 09/01/12 0519  Weight: 96.163 kg (212 lb) 96.3 kg (212 lb 4.9 oz) 94.9 kg (209 lb 3.5 oz)    Exam:  General: Elderly obese female lying in bed in no acute distress  HEENT: No pallor, moist oral mucosa  Cardiovascular: Normal S1 and S2, no murmurs rub or gallop  Respiratory: Clear to auscultation bilaterally, no added sounds  Abdomen: Soft, nontender, nondistended, bowel sounds present  Musculoskeletal: left BKA with dressing..  Warm right lower extremity with good distal pulsation  CNS: AAO x3   Data Reviewed: Basic Metabolic Panel:  Recent Labs Lab 09/01/12 1133 09/03/12 0535 09/03/12 1730 09/04/12 1917 09/05/12 0600  NA 136 135 135  --  137  K 4.8 4.5 4.1  --  4.4  CL 96 96 94*  --  96  CO2 27 28 28   --  26  GLUCOSE 344* 189* 179*  --  223*  BUN 40* 53* 52*  --  55*  CREATININE 2.08* 2.39* 2.44* 2.74* 2.42*  CALCIUM 9.0 9.2 9.4  --  9.0   Liver Function Tests: No results found for this basename: AST, ALT, ALKPHOS, BILITOT, PROT, ALBUMIN,  in the last 168 hours No results found for this basename: LIPASE, AMYLASE,  in the last 168 hours No results found for this basename: AMMONIA,  in the last 168 hours CBC:  Recent Labs Lab 08/31/12 0543 08/31/12 1408 09/01/12 1133 09/04/12 1917 09/05/12 0600  WBC 9.3 11.3* 18.1* 15.2* 12.4*  HGB 12.2 13.0 13.4 13.3 13.0  HCT 37.9 40.0 39.5 39.8 39.4  MCV 87.7 86.8 87.4 86.0 87.0  PLT 207 232 245 325 340   Cardiac Enzymes: No results found for this basename: CKTOTAL, CKMB, CKMBINDEX, TROPONINI,  in the last 168 hours BNP (last 3  results)  Recent Labs  05/30/12 1432 06/16/12 1254  PROBNP 3213.0* 168.0*   CBG:  Recent Labs Lab 09/04/12 1340 09/04/12 1700 09/04/12 2145 09/05/12 0619 09/05/12 1101  GLUCAP 199* 163* 190* 212* 170*    Recent Results (from the past 240 hour(s))  URINE CULTURE     Status: None   Collection Time    09/01/12  9:52 AM      Result Value Range Status   Specimen Description URINE, CATHETERIZED   Final   Special Requests NONE   Final   Culture  Setup Time 09/01/2012 10:37   Final   Colony Count NO GROWTH   Final   Culture NO GROWTH   Final   Report Status 09/02/2012 FINAL   Final  SURGICAL PCR SCREEN     Status: None   Collection Time    09/03/12  8:35 PM      Result Value Range Status   MRSA, PCR NEGATIVE  NEGATIVE Final   Staphylococcus aureus NEGATIVE  NEGATIVE Final   Comment:            The Xpert SA Assay (FDA     approved for NASAL specimens     in patients over 19 years of age),     is one component of     a comprehensive surveillance     program.  Test performance has     been validated by [employer] for patients greater     than or equal to 76 year old.     It is not intended     to diagnose infection nor to     guide or monitor treatment.     Studies: No results found.  Scheduled Meds: . docusate sodium  100 mg Oral Daily  . enoxaparin (LOVENOX) injection  30 mg Subcutaneous Q24H  . hydrALAZINE  50 mg Oral Q8H  . insulin aspart  0-15 Units Subcutaneous TID WC  . insulin aspart  5 Units Subcutaneous TID WC  . insulin glargine  35 Units Subcutaneous QHS  . isosorbide dinitrate  30 mg Oral BID  . labetalol  200 mg Oral BID  . pantoprazole  40 mg Oral Q breakfast   Continuous Infusions: . sodium chloride 75 mL/hr at 09/04/12 2033       Time spent: 25 minutes    [name]  [facility] Pager [phone] If 7PM-7AM, please contact night-coverage at [email or web address], password [facility] 09/05/2012, 11:25 AM  LOS: 7 days

## 2012-09-05 NOTE — Evaluation (Signed)
Physical Therapy Evaluation Patient Details Name: TECORA EUSTACHE MRN: [id number] DOB: 1930-05-08 Today's Date: 09/05/2012 Time: 8657-8469 PT Time Calculation (min): 16 min  PT Assessment / Plan / Recommendation Clinical Impression  Pt admit with left worse than right foot ischemia.  MD proceeded with left BKA on 09/04/12.  Pt had dilaudid prior to PT today.  Pt was agreeable initially to try to get up.  Pt in alot of pain despite being pre-medicated.  Once at EOB, pt with jerky movements in bil UES with pt unable to use her extremities functionally.  Pt not appeqring to be able to move her body like she was trying to.  She was almost "zoned out".  Unable to clear buttocks off of bed with multiple attempts.  Pain was a limiting factor as well as questionable medication as pt was not following commands well.  Will continue as able.  Will need NHP at d/c.      PT Assessment  Patient needs continued PT services    Follow Up Recommendations  SNF;Supervision/Assistance - 24 hour        Barriers to Discharge Decreased caregiver support;Inaccessible home environment      Equipment Recommendations  Wheelchair (measurements PT);Wheelchair cushion (measurements PT)         Frequency Min 3X/week    Precautions / Restrictions Precautions Precautions: Fall Precaution Comments: new left BKA Restrictions Weight Bearing Restrictions: No   Pertinent Vitals/Pain VSS, Increased left LE pain      Mobility  Bed Mobility Bed Mobility: Rolling Right;Right Sidelying to Sit;Sitting - Scoot to [place] of Bed;Sit to Supine Rolling Right: 1: +2 Total assist Rolling Right: Patient Percentage: 30% Rolling Left: Not tested (comment) Right Sidelying to Sit: With rails;HOB elevated;1: +2 Total assist Right Sidelying to Sit: Patient Percentage: 50% Left Sidelying to Sit: Not tested (comment) Sitting - Scoot to Edge of Bed: 1: +1 Total assist Sit to Supine: 1: +2 Total assist Sit to Supine: Patient  Percentage: 0% Sit to Sidelying Left: Not Tested (comment) Details for Bed Mobility Assistance: Pt with incr pain when pillow removed from under left BKA.  Pt agreed to try to get to EOB.  PT used pad to asssit to EOB with assist also with moving right LE and to raise torso to upright sitting.  Took incr time to assist pt to EOB.  Once at EOB, pt leaning posteriorly needing assist to sit on the EOB.    Transfers Transfers: Sit to Stand Sit to Stand: 1: +2 Total assist;With upper extremity assist;From bed;From elevated surface Sit to Stand: Patient Percentage: 10% Stand to Sit: Not tested (comment) Stand Pivot Transfers: Not tested (comment) Details for Transfer Assistance: Pt shook head that she wanted to get up.  However even with max encouragement and assist, pt was unable to clear bottom off of bed.  UES shaky and jerky and she could not keep them still to help her.  Her right LE would also not seem to stay on floor.  Pt giving very little effort and leaning posteriorly.  Layed pt down given that she just did not seem to be able to make her body move.   Ambulation/Gait Ambulation/Gait Assistance: Not tested (comment) Stairs: No Wheelchair Mobility Wheelchair Mobility: No         PT Diagnosis: Generalized weakness;Acute pain  PT Problem List: Decreased range of motion;Decreased activity tolerance;Decreased balance;Decreased mobility;Decreased knowledge of use of DME;Decreased safety awareness;Decreased knowledge of precautions;Obesity;Pain PT Treatment Interventions: DME instruction;Functional mobility training;Therapeutic activities;Therapeutic exercise;Cognitive  remediation;Patient/family education;Wheelchair mobility training;Balance training   PT Goals Acute Rehab PT Goals PT Goal Formulation: With patient Time For Goal Achievement: 09/19/12 Potential to Achieve Goals: Good Pt will Roll Supine to Right Side: with modified independence PT Goal: Rolling Supine to Right Side -  Progress: Goal set today Pt will Roll Supine to Left Side: with modified independence PT Goal: Rolling Supine to Left Side - Progress: Goal set today Pt will go Supine/Side to Sit: with supervision;with HOB 0 degrees PT Goal: Supine/Side to Sit - Progress: Goal set today Pt will Sit at [facility] of Bed: 1-2 min;Independently;with no upper extremity support PT Goal: Sit at [facility] Of Bed - Progress: Goal set today Pt will go Sit to Supine/Side: with supervision;with HOB 0 degrees PT Goal: Sit to Supine/Side - Progress: Goal set today Pt will go Sit to Stand: with mod assist PT Goal: Sit to Stand - Progress: Goal set today Pt will Transfer Bed to Chair/Chair to Bed: with +2 total assist (pt = 70%) PT Transfer Goal: Bed to Chair/Chair to Bed - Progress: Goal set today Pt will Perform Home Exercise Program: with supervision, verbal cues required/provided PT Goal: Perform Home Exercise Program - Progress: Goal set today Pt will Propel Wheelchair: 51 - 150 feet;with min assist PT Goal: Propel Wheelchair - Progress: Goal set today  Visit Information  Last PT Received On: 09/05/12 Assistance Needed: +2    Subjective Data  Subjective: Pt non verbal.  Mumbling unintelligible speech. Patient Stated Goal: Husband reports she needs to be able to get OOB (even to w/c) to be able to go home   Prior Functioning  Home Living Lives With: Spouse Available Help at Discharge: Family;Available 24 hours/day (supervision only - cannot physically assist) Type of Home: House Home Access: Stairs to enter [employer] of Steps: 2 Entrance Stairs-Rails: None Home Layout: One level Bathroom Shower/Tub: Health visitor: Handicapped height Bathroom Accessibility: Yes How Accessible: Accessible via walker Home Adaptive Equipment: Dan Humphreys - four wheeled;Walker - standard;Straight cane;Built-in shower seat;Shower chair without back Additional Comments: rollator will not fit through kitchen;  could go bedroom to bathroom Prior Function Level of Independence: Independent with assistive device(s) Able to Take Stairs?: Yes Comments: used cane usually  Communication Communication: Surveyor, mining Overall Cognitive Status: Impaired Area of Impairment: Memory Arousal/Alertness: Awake/alert Orientation Level: Disoriented to;Place;Time Behavior During Session: WFL for tasks performed Memory Deficits: husband assisting with answering prior function/home living questions    Extremity/Trunk Assessment Right Upper Extremity Assessment RUE ROM/Strength/Tone: Deficits RUE ROM/Strength/Tone Deficits: pt with jerky movements and unable to use UEs functionally today Left Upper Extremity Assessment LUE ROM/Strength/Tone: Deficits LUE ROM/Strength/Tone Deficits: pt with jerky movements and unable to use UEs functionally today Right Lower Extremity Assessment RLE ROM/Strength/Tone: Deficits;Due to pain RLE ROM/Strength/Tone Deficits: unable to assess secondary to painful to movement Left Lower Extremity Assessment LLE ROM/Strength/Tone: Deficits;Unable to fully assess;Due to pain LLE ROM/Strength/Tone Deficits: new left BKA and pt could not stand to move LE Trunk Assessment Trunk Assessment: Kyphotic   Balance Static Sitting Balance Static Sitting - Balance Support: Bilateral upper extremity supported (right foot supported) Static Sitting - Level of Assistance: 2: Max assist;3: Mod assist Static Sitting - Comment/# of Minutes: 5 minutes at EOB with posterior lean.   Static Standing Balance Static Standing - Level of Assistance: Not tested (comment)  End of Session PT - End of Session Equipment Utilized During Treatment: Gait belt Activity Tolerance: Patient limited by fatigue;Patient limited  by pain Patient left: in bed;with call bell/phone within reach Nurse Communication: Mobility status;Need for lift equipment       INGOLD,[place]yia [place] 09/05/2012, 3:39 PM [facility] [phone] (pager)

## 2012-09-05 NOTE — Progress Notes (Signed)
Placed patient on CPAP at 10cm for the night. Sp02=96%

## 2012-09-06 ENCOUNTER — Inpatient Hospital Stay (HOSPITAL_COMMUNITY): Payer: Medicare Other

## 2012-09-06 DIAGNOSIS — R509 Fever, unspecified: Secondary | ICD-10-CM

## 2012-09-06 LAB — BASIC METABOLIC PANEL
CO2: 26 mEq/L (ref 19–32)
Chloride: 101 mEq/L (ref 96–112)
Creatinine, Ser: 2.02 mg/dL — ABNORMAL HIGH (ref 0.50–1.10)
Glucose, Bld: 178 mg/dL — ABNORMAL HIGH (ref 70–99)

## 2012-09-06 LAB — CBC
HCT: 37.5 % (ref 36.0–46.0)
MCH: 28.2 pg (ref 26.0–34.0)
MCV: 86.6 fL (ref 78.0–100.0)
RDW: 14.4 % (ref 11.5–15.5)
WBC: 12.6 10*3/uL — ABNORMAL HIGH (ref 4.0–10.5)

## 2012-09-06 LAB — GLUCOSE, CAPILLARY
Glucose-Capillary: 172 mg/dL — ABNORMAL HIGH (ref 70–99)
Glucose-Capillary: 292 mg/dL — ABNORMAL HIGH (ref 70–99)

## 2012-09-06 MED ORDER — OXYCODONE HCL ER 15 MG PO T12A
15.0000 mg | EXTENDED_RELEASE_TABLET | Freq: Two times a day (BID) | ORAL | Status: DC
  Administered 2012-09-06 – 2012-09-09 (×7): 15 mg via ORAL
  Filled 2012-09-06 (×7): qty 1

## 2012-09-06 NOTE — Progress Notes (Addendum)
[facility] Progress Note  09/06/2012 9:05 AM POD 2  Subjective:  Pain is a little better today  Tm 101.2 (10pm--100.6 at 0400) now afebrile  VSS Filed Vitals:   09/06/12 0543  BP:   Pulse:   Temp: 98.5 F (36.9 C)  Resp:     Physical Exam: Incisions:  Is in tact with staples in tact.  There is some bloody drainage on the bandage.  CBC    Component Value Date/Time   WBC 12.6* 09/06/2012 0547   RBC 4.33 09/06/2012 0547   HGB 12.2 09/06/2012 0547   HCT 37.5 09/06/2012 0547   PLT 342 09/06/2012 0547   MCV 86.6 09/06/2012 0547   MCH 28.2 09/06/2012 0547   MCHC 32.5 09/06/2012 0547   RDW 14.4 09/06/2012 0547   LYMPHSABS 1.1 06/19/2012 1403   MONOABS 0.5 06/19/2012 1403   EOSABS 0.1 06/19/2012 1403   BASOSABS 0.0 06/19/2012 1403    BMET    Component Value Date/Time   NA 139 09/06/2012 0547   K 4.2 09/06/2012 0547   CL 101 09/06/2012 0547   CO2 26 09/06/2012 0547   GLUCOSE 178* 09/06/2012 0547   BUN 41* 09/06/2012 0547   CREATININE 2.02* 09/06/2012 0547   CALCIUM 9.0 09/06/2012 0547   CALCIUM 9.8 02/20/2012 1018   GFRNONAA 22* 09/06/2012 0547   GFRAA 26* 09/06/2012 0547    INR    Component Value Date/Time   INR 0.94 08/29/2012 0900     Intake/Output Summary (Last 24 hours) at 09/06/12 0905 Last data filed at 09/06/12 0433  Gross per 24 hour  Intake    160 ml  Output   1900 ml  Net  -1740 ml     Assessment/Plan:  79 y.o. female is s/p left below knee amputation  POD 2  -fever last pm and this morning-WBC slightly elevated, but is stable from day before. -incision looks good  -BUN/Creatinine is improved with good UOP -left BKA stump is viable   Doreatha Massed, PA-C [facility] [phone] 09/06/2012 9:05 AM  BKA stump healing satisfactorily-no evidence of infection Will watch fever and white count Renal functions stable Probably SNF

## 2012-09-06 NOTE — Progress Notes (Signed)
Placed patient on CPAP at 10cm 

## 2012-09-06 NOTE — Progress Notes (Signed)
[facility] PROGRESS NOTE  [name] NWG:[id number] DOB: February 20, 1930 DOA: 08/29/2012 PCP: Romero Belling, MD   Brief narrative  79 year old African American female with significant past medical history of diabetes, hypertension,PAD, coronary artery disease, combined systolic and diastolic dysfunction who presented with worsening pain in her left lower extremity. In the ED she was found to have a cold left foot, she was seen then by vascular surgery and admitted to their service. Hospitalist service was asked to evaluate the patient and manage her medical comorbidities.   Assessment/Plan:  Ischemia of the left foot  s/p angiography 3/17,   -s/p left BKA on 3/21 . Tolerated procedure well.  C/O persistent pain over left leg . i will add long acting oxycodone bid.   ?UTI -  Urine culture is negative. Treated with 3 days of Bactrim.now discontinued  Fever on 3/22 Noted to be febrile with a temperature of 101.2 overnight. Will order for blood culture and chest x-ray. Recently treated with a short course of Bactrim for presumed UTI.  Acute on chronic renal failure  Noted following the angiogram. Possibly related to contrast-induced nephropathy. I have held her lasix since 3/21. Also was given Bactrim for UTI which has been discontinued . Renal function improving. Will resume Lasix at a lower dose if continues to be better.  Chronic combined systolic and diastolic heart failure:  - compensated. On High dose Lasix but given worsened her renal function I have held it.   History of COPD  Continue when necessary nebs   Diabetes mellitus with peripheral neuropathy  On Lantus and pre-meal aspart which I will continue. Continue sliding scale insulin  Continue Neurontin   Hypertension  Uncontrolled likely with pain. Labetalol dose increased.   History of CAD  continue aspirin, statin and beta blocker   GERD  Continue PPI   OSA on CPAP   DVT Prophylaxis:  sq Lovenox  Code  Status: Full code  Family Communication: none At bedside  Disposition Plan: Currently inpatient   Procedures:  Left BKA on 3/21  Antibiotics:  None  HPI/Subjective:  S/p left BKA on 3/21. Tolerated well. Noted to be febrile overnight with MAXIMUM TEMPERATURE of 101.2.   Objective: Filed Vitals:   09/05/12 2211 09/05/12 2213 09/06/12 0429 09/06/12 0543  BP:   169/77   Pulse: 82  73   Temp:  97.8 F (36.6 C) 100.6 F (38.1 C) 98.5 F (36.9 C)  TempSrc:  Oral Oral Oral  Resp: 20  16   Height:      Weight:      SpO2: 96%  94%     Intake/Output Summary (Last 24 hours) at 09/06/12 1209 Last data filed at 09/06/12 0433  Gross per 24 hour  Intake    160 ml  Output   1900 ml  Net  -1740 ml   Filed Weights   08/29/12 1049 08/29/12 1303 09/01/12 0519  Weight: 96.163 kg (212 lb) 96.3 kg (212 lb 4.9 oz) 94.9 kg (209 lb 3.5 oz)    Exam:  General: Elderly obese female lying in bed in no acute distress  HEENT: No pallor, moist oral mucosa  Cardiovascular: Normal S1 and S2, no murmurs rub or gallop  Respiratory: Clear to auscultation bilaterally, no added sounds  Abdomen: Soft, nontender, nondistended, bowel sounds present  Musculoskeletal: left BKA with dressing.. Warm right lower extremity with good distal pulsation  CNS: AAO x3   Data Reviewed: Basic Metabolic Panel:  Recent Labs Lab 09/01/12 1133 09/03/12  0535 09/03/12 1730 09/04/12 1917 09/05/12 0600 09/06/12 0547  NA 136 135 135  --  137 139  K 4.8 4.5 4.1  --  4.4 4.2  CL 96 96 94*  --  96 101  CO2 27 28 28   --  26 26  GLUCOSE 344* 189* 179*  --  223* 178*  BUN 40* 53* 52*  --  55* 41*  CREATININE 2.08* 2.39* 2.44* 2.74* 2.42* 2.02*  CALCIUM 9.0 9.2 9.4  --  9.0 9.0   Liver Function Tests: No results found for this basename: AST, ALT, ALKPHOS, BILITOT, PROT, ALBUMIN,  in the last 168 hours No results found for this basename: LIPASE, AMYLASE,  in the last 168 hours No results found for this basename:  AMMONIA,  in the last 168 hours CBC:  Recent Labs Lab 08/31/12 1408 09/01/12 1133 09/04/12 1917 09/05/12 0600 09/06/12 0547  WBC 11.3* 18.1* 15.2* 12.4* 12.6*  HGB 13.0 13.4 13.3 13.0 12.2  HCT 40.0 39.5 39.8 39.4 37.5  MCV 86.8 87.4 86.0 87.0 86.6  PLT 232 245 325 340 342   Cardiac Enzymes: No results found for this basename: CKTOTAL, CKMB, CKMBINDEX, TROPONINI,  in the last 168 hours BNP (last 3 results)  Recent Labs  05/30/12 1432 06/16/12 1254  PROBNP 3213.0* 168.0*   CBG:  Recent Labs Lab 09/05/12 1101 09/05/12 1601 09/05/12 2036 09/06/12 0629 09/06/12 1119  GLUCAP 170* 170* 123* 172* 292*    Recent Results (from the past 240 hour(s))  URINE CULTURE     Status: None   Collection Time    09/01/12  9:52 AM      Result Value Range Status   Specimen Description URINE, CATHETERIZED   Final   Special Requests NONE   Final   Culture  Setup Time 09/01/2012 10:37   Final   Colony Count NO GROWTH   Final   Culture NO GROWTH   Final   Report Status 09/02/2012 FINAL   Final  SURGICAL PCR SCREEN     Status: None   Collection Time    09/03/12  8:35 PM      Result Value Range Status   MRSA, PCR NEGATIVE  NEGATIVE Final   Staphylococcus aureus NEGATIVE  NEGATIVE Final   Comment:            The Xpert SA Assay (FDA     approved for NASAL specimens     in patients over 48 years of age),     is one component of     a comprehensive surveillance     program.  Test performance has     been validated by [employer] for patients greater     than or equal to 46 year old.     It is not intended     to diagnose infection nor to     guide or monitor treatment.     Studies: No results found.  Scheduled Meds: . docusate sodium  100 mg Oral Daily  . enoxaparin (LOVENOX) injection  30 mg Subcutaneous Q24H  . hydrALAZINE  50 mg Oral Q8H  . insulin aspart  0-15 Units Subcutaneous TID WC  . insulin aspart  5 Units Subcutaneous TID WC  . insulin glargine  35 Units  Subcutaneous QHS  . isosorbide dinitrate  30 mg Oral BID  . labetalol  200 mg Oral BID  . pantoprazole  40 mg Oral Q breakfast   Continuous Infusions: . sodium  chloride 75 mL/hr at 09/06/12 0023      Time spent: 25 minutes    Eddie North  [facility] Pager [phone]. If 7PM-7AM, please contact night-coverage at [email or web address], password [facility] 09/06/2012, 12:09 PM  LOS: 8 days

## 2012-09-07 ENCOUNTER — Encounter (HOSPITAL_COMMUNITY): Payer: Self-pay | Admitting: Vascular Surgery

## 2012-09-07 DIAGNOSIS — S88119A Complete traumatic amputation at level between knee and ankle, unspecified lower leg, initial encounter: Secondary | ICD-10-CM

## 2012-09-07 DIAGNOSIS — L98499 Non-pressure chronic ulcer of skin of other sites with unspecified severity: Secondary | ICD-10-CM

## 2012-09-07 DIAGNOSIS — I739 Peripheral vascular disease, unspecified: Secondary | ICD-10-CM

## 2012-09-07 LAB — BASIC METABOLIC PANEL
BUN: 29 mg/dL — ABNORMAL HIGH (ref 6–23)
CO2: 25 mEq/L (ref 19–32)
Chloride: 106 mEq/L (ref 96–112)
Creatinine, Ser: 1.47 mg/dL — ABNORMAL HIGH (ref 0.50–1.10)

## 2012-09-07 LAB — GLUCOSE, CAPILLARY
Glucose-Capillary: 194 mg/dL — ABNORMAL HIGH (ref 70–99)
Glucose-Capillary: 182 mg/dL — ABNORMAL HIGH (ref 70–99)
Glucose-Capillary: 162 mg/dL — ABNORMAL HIGH (ref 70–99)

## 2012-09-07 MED ORDER — FUROSEMIDE 40 MG PO TABS
60.0000 mg | ORAL_TABLET | Freq: Two times a day (BID) | ORAL | Status: DC
  Administered 2012-09-07 – 2012-09-09 (×5): 60 mg via ORAL
  Filled 2012-09-07 (×7): qty 1

## 2012-09-07 MED ORDER — GLUCERNA SHAKE PO LIQD
237.0000 mL | Freq: Three times a day (TID) | ORAL | Status: DC
  Administered 2012-09-07 – 2012-09-09 (×5): 237 mL via ORAL

## 2012-09-07 MED ORDER — HYDROMORPHONE HCL PF 1 MG/ML IJ SOLN
2.0000 mg | INTRAMUSCULAR | Status: DC | PRN
  Administered 2012-09-07 – 2012-09-08 (×5): 2 mg via INTRAVENOUS
  Filled 2012-09-07 (×5): qty 2

## 2012-09-07 MED ORDER — GABAPENTIN 300 MG PO CAPS
300.0000 mg | ORAL_CAPSULE | Freq: Every day | ORAL | Status: DC
  Administered 2012-09-07: 300 mg via ORAL
  Filled 2012-09-07 (×2): qty 1

## 2012-09-07 NOTE — Progress Notes (Signed)
Met with patient's husband and daughter to discuss plans for pt's rehabilitation. They are in agreement that pt would not be able to tolerate the intensity of CIR and that she will require a longer LOS than CIR can offer. Requested CSW to meet with family to discuss SNF placement. For questions, call [phone].

## 2012-09-07 NOTE — Progress Notes (Addendum)
[facility] [place]  Postoperative Visit - Amputation  Date of Surgery: 08/29/2012 - 09/04/2012 Procedure(s): AMPUTATION BELOW KNEE Left Surgeon: Surgeon(s): Chuck Hint, MD POD: 3 Days Post-Op  Subjective [name] is a 79 y.o. female who is S/P Left Procedure(s): AMPUTATION BELOW KNEE.  Pt.reports  pain in the stump. States dressing feels tightThe patient notes pain is not well controlled. Pt. Appetite poor sec to pain. Some confusion on pain meds    Intake/Output Summary (Last 24 hours) at 09/07/12 0823 Last data filed at 09/07/12 0423  Gross per 24 hour  Intake 598.75 ml  Output   1200 ml  Net -601.25 ml   No data found.    Physical Examination  BP Readings from Last 3 Encounters:  09/07/12 172/70  09/07/12 172/70  09/07/12 172/70   Temp Readings from Last 3 Encounters:  09/07/12 98.8 F (37.1 C) Oral  09/07/12 98.8 F (37.1 C) Oral  09/07/12 98.8 F (37.1 C) Oral   SpO2 Readings from Last 3 Encounters:  09/07/12 95%  09/07/12 95%  09/07/12 95%   Pulse Readings from Last 3 Encounters:  09/07/12 71  09/07/12 71  09/07/12 71    Pt is Awake WDWN female with complaints of pain in left leg Left amputation wound is healing well.  There is good bone coverage in the stump Stump is warm and well perfused, without drainage; without erythema Thigh warm  Assessment/plan:  [name] is a 79 y.o. female who is s/p Left Procedure(s): AMPUTATION BELOW KNEE  The patient's stump is viable.  Follow-up 4 weeks from surgery  prob SNF vs CIR  Will leave dressing off to see if that helps with pain  Air mattress for decubiti  PT/OT  Mar[place]e [place] 8:23 AM 09/07/2012 [phone]  Agree with above. Left BKA looks fine.   Waverly Ferrari, MD, FACS Beeper [phone] 09/07/2012

## 2012-09-07 NOTE — Consult Note (Signed)
Physical Medicine and Rehabilitation Consult Reason for Consult: Left BKA Referring Physician: Dr. Edilia Bo   HPI: [name] is a 79 y.o. right-handed female with history of subdural hematoma with craniotomy, diabetes mellitus with peripheral neuropathy, diastolic congestive heart failure, COPD, peripheral vascular disease. Admitted 08/29/2012 with ischemic changes left lower extremity. Arteriogram showed severe tibial artery occlusive disease with no options for revascularization. Patient underwent left below-knee amputation 09/04/2012 per Dr. Edilia Bo. Postoperative pain management. Placed on subcutaneous Lovenox for DVT prophylaxis. Hospital course with elevated creatinine 2.74 with noted history of chronic renal insufficiency with baseline creatinine 1.85 but responded well to intravenous fluids and latest creatinine 2.02. Physical therapy evaluation completed 09/05/2012 with limited progress secondary to pain. Recommendations have been made for physical medicine rehabilitation consult to consider inpatient rehabilitation services   Review of Systems  Respiratory: Positive for cough and shortness of breath.   Cardiovascular: Positive for leg swelling.  Gastrointestinal:       Reflux  Musculoskeletal: Positive for myalgias and joint pain.  Neurological: Positive for weakness.  All other systems reviewed and are negative.   Past Medical History  Diagnosis Date  . Uterine cancer   . Diabetes mellitus     type II; peripheral neuropathy  . Hypertension   . GERD (gastroesophageal reflux disease)   . Peripheral vascular disease   . Obesity   . Dyslipidemia   . Diastolic CHF, chronic     EF 50-55%, mild LVH and grade 1 diast. Dysfxn  . Osteoarthritis cervical spine   . DM retinopathy   . Sleep apnea     with CPAP  . History of shingles   . COPD (chronic obstructive pulmonary disease)   . Depression   . Peptic ulcer disease     duodenal  . Peptic stricture of esophagus   .  Hiatal hernia   . Diverticulosis   . Arthritis   . ASCVD (arteriosclerotic cardiovascular disease)     on MRI brin  . CAD (coronary artery disease)     a. s/p multiple caths with nonobs CAD;   b. cath 1/10: pLAD 20%, mLAD 40%, pCFX 20%, mCFX 40%, pRCA 60-70%;   c.  Myoview 06/02/12: Low anterior wall scar, no ischemia, EF 37%  . Cardiomyopathy     a. Echo 05/31/12: Severe LVH, EF 40-45%, diffuse HK, grade 1 diastolic dysfunction, mild to moderate calcified aortic valve annulus, mild to moderate aortic stenosis, MAC, mild MR, moderate LAE  . Asthma     Mild  . Pruritic condition     Idiopathic  . Hyperlipidemia   . Zoster   . Noncompliance   . CKD (chronic kidney disease)     Dr. Eliott Nine   Past Surgical History  Procedure Laterality Date  . Tubal ligation  1967  . Knee arthroscopy  10/1998    Left  . Craniotomy  1997    Left for SDH  . Cataract extraction, bilateral  2005  . Hernia repair    . Esophagogastroduodenoscopy  04/04/2004  . Spine surgery      C-spine and lumbar surgery  . Cholecystectomy  2010  . Cardiac catheterization    . Dexa  7/05  . Abdominal hysterectomy     Family History  Problem Relation Age of Onset  . Cancer Mother     "Stomach" Cancer  . Diabetes Mother   . Heart disease Mother   . Stomach cancer Mother   . Lymphoma Father   . Kidney disease Paternal Grandmother   .  Asthma Other    Social History:  reports that she has never smoked. She does not have any smokeless tobacco history on file. She reports that she does not drink alcohol or use illicit drugs. Allergies:  Allergies  Allergen Reactions  . Iohexol      Code: RASH, Desc: JENNIFER STATES ON PT'S CHART ALLERGIC TO IV DYE 09/04/07/RM, Onset Date: [id number]    Medications Prior to Admission  Medication Sig Dispense Refill  . acetaminophen (TYLENOL) 500 MG tablet Take 1,000 mg by mouth every 6 (six) hours as needed. For pain      . aspirin EC 325 MG tablet Take 325 mg by mouth daily.       . cholecalciferol (VITAMIN D) 1000 UNITS tablet Take 1,000 Units by mouth daily.      Marland Kitchen ezetimibe (ZETIA) 10 MG tablet Take 10 mg by mouth daily.       . furosemide (LASIX) 80 MG tablet Take 160 mg by mouth 2 (two) times daily.      Marland Kitchen gabapentin (NEURONTIN) 300 MG capsule Take 300 mg by mouth at bedtime.      . hydrALAZINE (APRESOLINE) 25 MG tablet Take 25 mg by mouth 3 (three) times daily.      . insulin NPH-insulin regular (NOVOLIN 70/30) (70-30) 100 UNIT/ML injection Inject 80 Units into the skin daily with breakfast.      . isosorbide dinitrate (ISORDIL) 30 MG tablet Take 30 mg by mouth 2 (two) times daily.       Marland Kitchen labetalol (NORMODYNE) 200 MG tablet Take 100 mg by mouth 2 (two) times daily.       Marland Kitchen latanoprost (XALATAN) 0.005 % ophthalmic solution Place 1 drop into both eyes at bedtime.      . metolazone (ZAROXOLYN) 5 MG tablet Take 1 tablet (5 mg total) by mouth daily as needed. Daily as needed for weight gain over 221 pounds  30 tablet  3  . nitroGLYCERIN (NITROSTAT) 0.4 MG SL tablet Place 0.4 mg under the tongue every 5 (five) minutes as needed for chest pain.       . potassium chloride SA (K-DUR,KLOR-CON) 20 MEQ tablet Take 40 mEq by mouth 2 (two) times daily.      . rosuvastatin (CRESTOR) 40 MG tablet Take 40 mg by mouth every morning.         Home: Home Living Lives With: Spouse Available Help at Discharge: Family;Available 24 hours/day (supervision only - cannot physically assist) Type of Home: House Home Access: Stairs to enter [employer] of Steps: 2 Entrance Stairs-Rails: None Home Layout: One level Bathroom Shower/Tub: Health visitor: Handicapped height Bathroom Accessibility: Yes How Accessible: Accessible via walker Home Adaptive Equipment: Dan Humphreys - four wheeled;Walker - standard;Straight cane;Built-in shower seat;Shower chair without back Additional Comments: rollator will not fit through kitchen; could go bedroom to bathroom  Functional  History: Prior Function Able to Take Stairs?: Yes Comments: used cane usually  Functional Status:  Mobility: Bed Mobility Bed Mobility: Rolling Right;Right Sidelying to Sit;Sitting - Scoot to [place] of Bed;Sit to Supine Rolling Right: 1: +2 Total assist Rolling Right: Patient Percentage: 30% Rolling Left: Not tested (comment) Right Sidelying to Sit: With rails;HOB elevated;1: +2 Total assist Right Sidelying to Sit: Patient Percentage: 50% Left Sidelying to Sit: Not tested (comment) Sitting - Scoot to Edge of Bed: 1: +1 Total assist Sit to Supine: 1: +2 Total assist Sit to Supine: Patient Percentage: 0% Sit to Sidelying Left: Not Tested (comment) Transfers Transfers: Sit  to Stand Sit to Stand: 1: +2 Total assist;With upper extremity assist;From bed;From elevated surface Sit to Stand: Patient Percentage: 10% Stand to Sit: Not tested (comment) Stand to Sit: Patient Percentage: 50% Stand Pivot Transfers: Not tested (comment) Stand Pivot Transfers: Patient Percentage: 60% Ambulation/Gait Ambulation/Gait Assistance: Not tested (comment) Stairs: No Wheelchair Mobility Wheelchair Mobility: No  ADL:    Cognition: Cognition Arousal/Alertness: Awake/alert Orientation Level: Disoriented X4 Cognition Overall Cognitive Status: Impaired Area of Impairment: Memory Arousal/Alertness: Awake/alert Orientation Level: Disoriented to;Place;Time Behavior During Session: WFL for tasks performed Memory Deficits: husband assisting with answering prior function/home living questions  Blood pressure 172/70, pulse 71, temperature 98.8 F (37.1 C), temperature source Oral, resp. rate 17, height 5\' 6"  (1.676 m), weight 94.9 kg (209 lb 3.5 oz), SpO2 95.00%. Physical Exam  Vitals reviewed. Constitutional:  Obese, moderate distress  Eyes: EOM are normal.  Neck: Neck supple. No thyromegaly present.  Cardiovascular: Normal rate and regular rhythm.   Pulmonary/Chest: Breath sounds normal. She has no  wheezes.  Abdominal: Bowel sounds are normal. She exhibits no distension.  Neurological: She is alert. No cranial nerve deficit. She exhibits normal muscle tone.  Patient was able to answer basic questions. She was a poor medical historian. Followed simple commands. Decreased PP and LT in distal RLE. Some pain with palpation as well. Strength grossly 3- to 4/5 in UE and RLE  Skin:  Amputation site is dressed  Psychiatric:  Anxious,     Results for orders placed during the hospital encounter of 08/29/12 (from the past 24 hour(s))  GLUCOSE, CAPILLARY     Status: Abnormal   Collection Time    09/06/12 11:19 AM      Result Value Range   Glucose-Capillary 292 (*) 70 - 99 mg/dL   Comment 1 Notify RN     Comment 2 Documented in Chart    GLUCOSE, CAPILLARY     Status: Abnormal   Collection Time    09/06/12  4:01 PM      Result Value Range   Glucose-Capillary 164 (*) 70 - 99 mg/dL   Comment 1 Notify RN     Comment 2 Documented in Chart    GLUCOSE, CAPILLARY     Status: Abnormal   Collection Time    09/06/12  9:11 PM      Result Value Range   Glucose-Capillary 148 (*) 70 - 99 mg/dL  GLUCOSE, CAPILLARY     Status: Abnormal   Collection Time    09/07/12  6:05 AM      Result Value Range   Glucose-Capillary 174 (*) 70 - 99 mg/dL   Dg Chest Port 1 View  09/06/2012  *RADIOLOGY REPORT*  Clinical Data: Chest pain, short of breath, fever  PORTABLE CHEST - 1 VIEW  Comparison: 05/30/2012  Findings: Cardiac enlargement with diffuse bilateral airspace disease consistent with pulmonary edema.  Decreased lung volume with mild atelectasis in the bases.  No significant effusion.  IMPRESSION: Cardiac enlargement with bilateral edema.  Hypoventilation with decreased lung volume.   Original Report Authenticated By: Janeece Riggers, M.D.     Assessment/Plan: Diagnosis: left BKA 1. Does the need for close, 24 hr/day medical supervision in concert with the patient's rehab needs make it unreasonable for this  patient to be served in a less intensive setting? Potentially 2. Co-Morbidities requiring supervision/potential complications: dm, htn, cad, copd, osa, morbid obesity, pain 3. Due to bladder management, bowel management, safety, skin/wound care, disease management, medication administration, pain management and patient education,  does the patient require 24 hr/day rehab nursing? Yes 4. Does the patient require coordinated care of a physician, rehab nurse, PT (1-2 hrs/day, 5 days/week) and OT (1-2 hrs/day, 5 days/week) to address physical and functional deficits in the context of the above medical diagnosis(es)? Yes Addressing deficits in the following areas: balance, endurance, locomotion, strength, transferring, bowel/bladder control, bathing, dressing, feeding, grooming, toileting, psychosocial support and pain 5. Can the patient actively participate in an intensive therapy program of at least 3 hrs of therapy per day at least 5 days per week? Potentially 6. The potential for patient to make measurable gains while on inpatient rehab is good 7. Anticipated functional outcomes upon discharge from inpatient rehab are supervision to minimal assist with PT, supervision to minimal assist with OT, n/a with SLP. 8. Estimated rehab length of stay to reach the above functional goals is: 2wks? 9. Does the patient have adequate social supports to accommodate these discharge functional goals? Yes 10. Anticipated D/C setting: Home 11. Anticipated post D/C treatments: HH therapy 12. Overall Rehab/Functional Prognosis: good  RECOMMENDATIONS: This patient's condition is appropriate for continued rehabilitative care in the following setting: CIR Patient has agreed to participate in recommended program. Yes Note that insurance prior authorization may be required for reimbursement for recommended care.  Comment: Pt was ambulatory with cane PTA. Pain is a major issue at present. Husband is very supportive. Will  follow for activity tolerance and pain control. If pain can be controlled, I think she will be a good inpatient rehab candidate. Rehab RN to follow up.   Ranelle Oyster, MD, [place] [facility]     09/07/2012

## 2012-09-07 NOTE — Progress Notes (Signed)
[facility] PROGRESS NOTE  [name] OZD:[id number] DOB: 01/02/1930 DOA: 08/29/2012 PCP: Romero Belling, MD  Brief narrative  79 year old African American female with significant past medical history of diabetes, hypertension,PAD, coronary artery disease, combined systolic and diastolic dysfunction who presented with worsening pain in her left lower extremity. In the ED she was found to have a cold left foot, she was seen then by vascular surgery and admitted to their service. Hospitalist service was asked to evaluate the patient and manage her medical comorbidities.    Assessment/Plan:  Ischemia of the left foot  s/p angiography 3/17,  -s/p left BKA on 3/21 . Tolerated procedure well.  -Patient continues to have pain over the amputation site and also complaining of generalized pain. I had added on long-acting OxyContin yesterday however still continues to have pain on a low dose when necessary Dilaudid. I have increased her Dilaudid dose to 2 mg every 3 hours as needed. We'll monitor for any sedation   ?UTI -  Urine culture is negative. Treated with 3 days of Bactrim.now discontinued   Fever on 3/22  Noted to be febrile with a temperature of 101.2 overnight. blood culture so far no growth and normal chest x-ray. Recently treated with a short course of Bactrim for presumed UTI.   Acute on chronic renal failure  Noted following the angiogram. Possibly related to contrast-induced nephropathy. I have held her lasix since 3/21. Also was given Bactrim for UTI which has been discontinued . Renal function slowly improving.  resume Lasix at a lower dose today (60 mg twice a day, patient is normally at 160 mg twice a day)   Chronic combined systolic and diastolic heart failure:  - compensated. On High dose Lasix but given worsened her renal function which was held and it easy him today at a lower dose.  History of COPD  Continue when necessary nebs   Diabetes mellitus with  peripheral neuropathy  On Lantus and pre-meal aspart which I will continue. Continue sliding scale insulin  Appears patient was not getting a Neurontin at bedtime which I have resumed.  Hypertension  Uncontrolled likely with pain. Labetalol dose increased.   History of CAD  continue aspirin, statin and beta blocker   GERD  Continue PPI   OSA on CPAP   DVT Prophylaxis:  sq Lovenox   Code Status: Full code  Family Communication: Husband At bedside  Disposition Plan: CIR evaluating patient.  Discharge per primary team  Procedures:  Left BKA on 3/21  Antibiotics:  Received 3 days of Bactrim  HPI/Subjective:  S/p left BKA on 3/21. Complains of pain over amputation site and generalized pain.  Objective: Filed Vitals:   09/07/12 0426 09/07/12 0519 09/07/12 0644 09/07/12 0928  BP: 198/66 186/67 172/70 168/67  Pulse:    81  Temp:   98.8 F (37.1 C)   TempSrc:   Oral   Resp:      Height:      Weight:      SpO2:        Intake/Output Summary (Last 24 hours) at 09/07/12 1211 Last data filed at 09/07/12 0423  Gross per 24 hour  Intake 598.75 ml  Output   1200 ml  Net -601.25 ml   Filed Weights   08/29/12 1049 08/29/12 1303 09/01/12 0519  Weight: 96.163 kg (212 lb) 96.3 kg (212 lb 4.9 oz) 94.9 kg (209 lb 3.5 oz)    Exam:  General: Elderly obese female lying in bed in no  acute distress HEENT: No pallor, moist oral mucosa Cardiovascular: Normal S1 and S2, no murmurs rub or gallop Respiratory: Clear to auscultation bilaterally, no added sounds Abdomen: Soft, nontender, nondistended, bowel sounds present Musculoskeletal: left BKA with dressing.. Warm right lower extremity with good distal pulsation CNS: AAO x3   Data Reviewed: Basic Metabolic Panel:  Recent Labs Lab 09/03/12 0535 09/03/12 1730 09/04/12 1917 09/05/12 0600 09/06/12 0547 09/07/12 0724  NA 135 135  --  137 139 143  K 4.5 4.1  --  4.4 4.2 4.3  CL 96 94*  --  96 101 106  CO2 28 28  --  26 26 25    GLUCOSE 189* 179*  --  223* 178* 189*  BUN 53* 52*  --  55* 41* 29*  CREATININE 2.39* 2.44* 2.74* 2.42* 2.02* 1.47*  CALCIUM 9.2 9.4  --  9.0 9.0 8.8   Liver Function Tests: No results found for this basename: AST, ALT, ALKPHOS, BILITOT, PROT, ALBUMIN,  in the last 168 hours No results found for this basename: LIPASE, AMYLASE,  in the last 168 hours No results found for this basename: AMMONIA,  in the last 168 hours CBC:  Recent Labs Lab 08/31/12 1408 09/01/12 1133 09/04/12 1917 09/05/12 0600 09/06/12 0547  WBC 11.3* 18.1* 15.2* 12.4* 12.6*  HGB 13.0 13.4 13.3 13.0 12.2  HCT 40.0 39.5 39.8 39.4 37.5  MCV 86.8 87.4 86.0 87.0 86.6  PLT 232 245 325 340 342   Cardiac Enzymes: No results found for this basename: CKTOTAL, CKMB, CKMBINDEX, TROPONINI,  in the last 168 hours BNP (last 3 results)  Recent Labs  05/30/12 1432 06/16/12 1254  PROBNP 3213.0* 168.0*   CBG:  Recent Labs Lab 09/06/12 1119 09/06/12 1601 09/06/12 2111 09/07/12 0605 09/07/12 1118  GLUCAP 292* 164* 148* 174* 194*    Recent Results (from the past 240 hour(s))  URINE CULTURE     Status: None   Collection Time    09/01/12  9:52 AM      Result Value Range Status   Specimen Description URINE, CATHETERIZED   Final   Special Requests NONE   Final   Culture  Setup Time 09/01/2012 10:37   Final   Colony Count NO GROWTH   Final   Culture NO GROWTH   Final   Report Status 09/02/2012 FINAL   Final  SURGICAL PCR SCREEN     Status: None   Collection Time    09/03/12  8:35 PM      Result Value Range Status   MRSA, PCR NEGATIVE  NEGATIVE Final   Staphylococcus aureus NEGATIVE  NEGATIVE Final   Comment:            The Xpert SA Assay (FDA     approved for NASAL specimens     in patients over 78 years of age),     is one component of     a comprehensive surveillance     program.  Test performance has     been validated by [employer] for patients greater     than or equal to 41 year old.     It  is not intended     to diagnose infection nor to     guide or monitor treatment.  CULTURE, BLOOD (ROUTINE X 2)     Status: None   Collection Time    09/06/12  1:45 PM      Result Value Range Status   Specimen  Description BLOOD LEFT ARM   Final   Special Requests BOTTLES DRAWN AEROBIC ONLY 5CC   Final   Culture  Setup Time 09/06/2012 21:14   Final   Culture     Final   Value:        BLOOD CULTURE RECEIVED NO GROWTH TO DATE CULTURE WILL BE HELD FOR 5 DAYS BEFORE ISSUING A FINAL NEGATIVE REPORT   Report Status PENDING   Incomplete  CULTURE, BLOOD (ROUTINE X 2)     Status: None   Collection Time    09/06/12  2:00 PM      Result Value Range Status   Specimen Description BLOOD LEFT HAND   Final   Special Requests BOTTLES DRAWN AEROBIC ONLY 3CC   Final   Culture  Setup Time 09/06/2012 21:14   Final   Culture     Final   Value:        BLOOD CULTURE RECEIVED NO GROWTH TO DATE CULTURE WILL BE HELD FOR 5 DAYS BEFORE ISSUING A FINAL NEGATIVE REPORT   Report Status PENDING   Incomplete     Studies: Dg Chest Port 1 View  09/06/2012  *RADIOLOGY REPORT*  Clinical Data: Chest pain, short of breath, fever  PORTABLE CHEST - 1 VIEW  Comparison: 05/30/2012  Findings: Cardiac enlargement with diffuse bilateral airspace disease consistent with pulmonary edema.  Decreased lung volume with mild atelectasis in the bases.  No significant effusion.  IMPRESSION: Cardiac enlargement with bilateral edema.  Hypoventilation with decreased lung volume.   Original Report Authenticated By: Janeece Riggers, M.D.     Scheduled Meds: . docusate sodium  100 mg Oral Daily  . enoxaparin (LOVENOX) injection  30 mg Subcutaneous Q24H  . feeding supplement  237 mL Oral TID WC  . furosemide  60 mg Oral BID  . hydrALAZINE  50 mg Oral Q8H  . insulin aspart  0-15 Units Subcutaneous TID WC  . insulin aspart  5 Units Subcutaneous TID WC  . insulin glargine  35 Units Subcutaneous QHS  . isosorbide dinitrate  30 mg Oral BID  .  labetalol  200 mg Oral BID  . OxyCODONE  15 mg Oral Q12H  . pantoprazole  40 mg Oral Q breakfast   Continuous Infusions: . sodium chloride 75 mL/hr at 09/07/12 0301      Time spent: 25 minutes    [name]  [facility] Pager [phone] If 7PM-7AM, please contact night-coverage at [email or web address], password [email or web address] 09/07/2012, 12:11 PM  LOS: 9 days

## 2012-09-07 NOTE — Evaluation (Signed)
Occupational Therapy Evaluation Patient Details Name: [name] MRN: [id number] DOB: 03-24-1930 Today's Date: 09/07/2012 Time: 4098-1191 OT Time Calculation (min): 38 min  OT Assessment / Plan / Recommendation Clinical Impression  Pt s/p L BKA & presents w/ confusion/impaired cognition & overall +2 total assist for ADL's. PLOF was Mod I level per pt's husband whom was in/out during assessment & treatment session. Pt should benefit from acute OT followed by SNF vs in-pt rehab if able to tolerate treatment sessions.    OT Assessment  Patient needs continued OT Services    Follow Up Recommendations  CIR;SNF    Barriers to Discharge      Equipment Recommendations  None recommended by OT    Recommendations for Other Services Rehab consult  Frequency  Min 3X/week    Precautions / Restrictions Precautions Precautions: Fall Precaution Comments: new left BKA Restrictions Weight Bearing Restrictions: No   Pertinent Vitals/Pain Pt moaning w/ movement & held Left hip/LE. RN & pt's spouse report that pt was given pain medication prior to session. Pt unable to rate when asked.   ADL  Grooming: Performed;Wash/dry hands;Wash/dry face;Moderate assistance;Maximal assistance Where Assessed - Grooming: Unsupported sitting (Sitting EOB w/ Min-Mod assist & hand over hand technique) Upper Body Bathing: Performed;Chest;Right arm;Left arm;Abdomen;Maximal assistance Where Assessed - Upper Body Bathing: Unsupported sitting (EOB, w/ hand over hand assist & consistent VC's for position) Lower Body Bathing: Simulated;+2 Total assistance Lower Body Bathing: Patient Percentage: 10% Where Assessed - Lower Body Bathing: Rolling right and/or left;Supine, head of bed flat Upper Body Dressing: Performed;+1 Total assistance Where Assessed - Upper Body Dressing: Unsupported sitting (Sitting EOB w/ assistance & consistent VC's) Lower Body Dressing: Simulated;+2 Total assistance Lower Body Dressing:  Patient Percentage: 10% Where Assessed - Lower Body Dressing: Rolling right and/or left;Supine, head of bed flat Toilet Transfer: Simulated;+2 Total assistance Toilet Transfer: Patient Percentage: 0% Toilet Transfer Method: Stand pivot Acupuncturist: Extra wide bedside commode;Drop arm bedside commode Toileting - Clothing Manipulation and Hygiene: Simulated;+2 Total assistance Toileting - Architect and Hygiene: Patient Percentage: 10% Where Assessed - Toileting Clothing Manipulation and Hygiene: Supine, head of bed flat;Rolling right and/or left Tub/Shower Transfer Method: Not assessed Equipment Used: Gait belt Transfers/Ambulation Related to ADLs: Pt unable to perform functional mobility due to pain and lethergy. Bed mobility was +1-+2 supine-sit at EOB w/ assist bilateral LE's & UE's ADL Comments: Pt is +2 total assist for ADL's, IADL's at this time. Pt spouse reports that she used cane & was Mod I PLOF & ? pain medications. Pt was seen for ADL treatment session at conclusion of Eval for bathing and dressing in sitting position at EOB. She was +1 assist once up in sitting but +2 sit-supine and supine-sit w/ bilat LE/UE A.    OT Diagnosis: Generalized weakness;Cognitive deficits;Acute pain  OT Problem List: Decreased activity tolerance;Impaired balance (sitting and/or standing);Decreased cognition;Decreased safety awareness;Decreased knowledge of use of DME or AE;Decreased knowledge of precautions;Pain OT Treatment Interventions: Self-care/ADL training;DME and/or AE instruction;Therapeutic activities;Patient/family education;Cognitive remediation/compensation;Balance training   OT Goals Acute Rehab OT Goals OT Goal Formulation: Patient unable to participate in goal setting Potential to Achieve Goals: Good ADL Goals Pt Will Perform Grooming: with supervision;Sitting, edge of bed;Sitting, chair;Unsupported ADL Goal: Grooming - Progress: Goal set today Pt Will Transfer  to Toilet: with mod assist;Stand pivot transfer;with DME;Drop arm 3-in-1;Other (comment) (vs sliding board transfer) ADL Goal: Toilet Transfer - Progress: Goal set today Pt Will Perform Toileting - Clothing Manipulation: with mod assist;Sitting  on 3-in-1 or toilet;Rolling right and/or left ADL Goal: Toileting - Clothing Manipulation - Progress: Goal set today Pt Will Perform Toileting - Hygiene: with mod assist;Leaning right and/or left on 3-in-1/toilet;Sitting on 3-in-1 or toilet ADL Goal: Toileting - Hygiene - Progress: Goal set today Additional ADL Goal #1: Pt will be Mod I sitting EOB x25min during ADL activity in preparation for increased participation in self care tasks. ADL Goal: Additional Goal #1 - Progress: Goal set today  Visit Information  Last OT Received On: 09/07/12 Assistance Needed: +2    Subjective Data  Subjective: Pt states "I don't remember" to questions when asked or doesn't answer at all. Patient Stated Goal: Unable to state. Pt spouse wants pt to go the in pt rehab. then home   Prior Functioning     Home Living Lives With: Spouse Available Help at Discharge: Family;Available 24 hours/day (supervision only - cannot physically assist) Type of Home: House Home Access: Stairs to enter [employer] of Steps: 2 Entrance Stairs-Rails: None Home Layout: One level Bathroom Shower/Tub: Health visitor: Handicapped height Bathroom Accessibility: Yes How Accessible: Accessible via walker Home Adaptive Equipment: Dan Humphreys - four wheeled;Walker - standard;Straight cane;Built-in shower seat;Shower chair without back Additional Comments: rollator will not fit through kitchen; could go bedroom to bathroom Prior Function Level of Independence: Independent with assistive device(s) Able to Take Stairs?: Yes Comments: Used cane usually Communication Communication: HOH Dominant Hand: Right    Vision/Perception Vision - History Baseline Vision:  Other (comment) (Difficult to assess, pt unable  )   Cognition  Cognition Overall Cognitive Status: Impaired Area of Impairment: Memory Arousal/Alertness: Awake/alert Orientation Level: Disoriented to;Place;Time Behavior During Session: WFL for tasks performed Memory Deficits: husband assisting with answering prior function/home living questions    Extremity/Trunk Assessment Right Upper Extremity Assessment RUE ROM/Strength/Tone: Deficits RUE ROM/Strength/Tone Deficits: pt with jerky movements and unable to use UEs functionally today, generalized weakness throughout, difficuly following commands for formal testing. Left Upper Extremity Assessment LUE ROM/Strength/Tone: Deficits LUE ROM/Strength/Tone Deficits: pt with jerky movements and unable to use UEs functionally today, generalized weakness throughout, difficuly following commands for formal testing Trunk Assessment Trunk Assessment: Kyphotic    Mobility Bed Mobility Bed Mobility: Rolling Right;Right Sidelying to Sit;Sitting - Scoot to [employer] of Bed;Sit to Supine Rolling Right: 1: +2 Total assist Rolling Right: Patient Percentage: 20% Right Sidelying to Sit: With rails;HOB elevated;1: +2 Total assist Right Sidelying to Sit: Patient Percentage: 20% Sitting - Scoot to Edge of Bed: 1: +1 Total assist Sit to Supine: 1: +2 Total assist;HOB flat Sit to Supine: Patient Percentage: 0% Details for Bed Mobility Assistance: Pt with incr pain when pillow removed from under left BKA.  Pt agreed to try to get to EOB.  OT used pad to asssit to EOB with assist also with moving right LE and to raise torso to upright sitting.  Took incr time to assist pt to EOB.  Once at EOB, pt leaning forward & needing assist to sit on the EOB.    Transfers Transfers: Not assessed       Balance Balance Balance Assessed: Yes Static Sitting Balance Static Sitting - Balance Support: Bilateral upper extremity supported Static Sitting - Level of Assistance: 3: Mod  assist;2: Max assist (Sit EOB x75min w/ Min- Mod assist & constant VC's ) Static Sitting - Comment/# of Minutes: Pt sitting at EOB approx during ADL tasks w/ constant VC's and Min-Mod assist Static Standing Balance Static Standing - Level of Assistance: Not  tested (comment)   End of Session OT - End of Session Activity Tolerance: Patient limited by pain;Patient limited by fatigue Patient left: in bed;with call bell/phone within reach;with bed alarm set Nurse Communication: Mobility status;Other (comment) (Level of assist for ADL's)  GO     Alm Bustard 09/07/2012, 2:07 PM

## 2012-09-07 NOTE — Progress Notes (Signed)
Met with patient and her husband at bedside. Pt lethargic, husband reports due to pain meds. Patient continues with pain which is affecting her appetite and ability to participate fully in therapy. Will continue to follow to assess functional progress. Will begin process for insurance authorization for CIR.    For questions, call [phone].

## 2012-09-08 DIAGNOSIS — E1129 Type 2 diabetes mellitus with other diabetic kidney complication: Secondary | ICD-10-CM

## 2012-09-08 DIAGNOSIS — N189 Chronic kidney disease, unspecified: Secondary | ICD-10-CM

## 2012-09-08 DIAGNOSIS — N058 Unspecified nephritic syndrome with other morphologic changes: Secondary | ICD-10-CM

## 2012-09-08 LAB — BASIC METABOLIC PANEL
BUN: 27 mg/dL — ABNORMAL HIGH (ref 6–23)
Calcium: 9 mg/dL (ref 8.4–10.5)
Creatinine, Ser: 1.4 mg/dL — ABNORMAL HIGH (ref 0.50–1.10)
GFR calc Af Amer: 40 mL/min — ABNORMAL LOW (ref >90)

## 2012-09-08 LAB — GLUCOSE, CAPILLARY: Glucose-Capillary: 212 mg/dL — ABNORMAL HIGH (ref 70–99)

## 2012-09-08 MED ORDER — ENOXAPARIN SODIUM 40 MG/0.4ML ~~LOC~~ SOLN
40.0000 mg | SUBCUTANEOUS | Status: DC
  Administered 2012-09-08: 40 mg via SUBCUTANEOUS
  Filled 2012-09-08 (×2): qty 0.4

## 2012-09-08 MED ORDER — GABAPENTIN 300 MG PO CAPS
300.0000 mg | ORAL_CAPSULE | Freq: Three times a day (TID) | ORAL | Status: DC
  Administered 2012-09-08 – 2012-09-09 (×4): 300 mg via ORAL
  Filled 2012-09-08 (×6): qty 1

## 2012-09-08 MED ORDER — INSULIN GLARGINE 100 UNIT/ML ~~LOC~~ SOLN
40.0000 [IU] | Freq: Every day | SUBCUTANEOUS | Status: DC
  Administered 2012-09-08: 40 [IU] via SUBCUTANEOUS
  Filled 2012-09-08 (×2): qty 0.4

## 2012-09-08 NOTE — Progress Notes (Signed)
Pt was placed on CPAP with a M/L nasal mask. Pt is set on 10 cmH2O. Pt is comfortable vitals are stable. RT will continue to monitor.

## 2012-09-08 NOTE — Progress Notes (Signed)
[facility] PROGRESS NOTE  [name] ZOX:[id number] DOB: 02-17-1930 DOA: 08/29/2012 PCP: Romero Belling, MD  Brief narrative  79 year old African American female with significant past medical history of diabetes, hypertension,PAD, coronary artery disease, combined systolic and diastolic dysfunction who presented with worsening pain in her left lower extremity. In the ED she was found to have a cold left foot, she was seen then by vascular surgery and admitted to their service. Hospitalist service was asked to evaluate the patient and manage her medical comorbidities.    Assessment/Plan:  Ischemia of the left foot  s/p angiography 3/17,  -s/p left BKA on 3/21 . Tolerated procedure well.  Pain is better controlled today with Oxycontin and prn oxycodone. Will also add neuron tin 300 mg po TID     ?UTI -  Urine culture is negative. Treated with 3 days of Bactrim.now discontinued   Fever on 3/22  Low grade fever on 3/24 Afebrile today Will continue to monitor  Acute on chronic renal failure  Noted following the angiogram. Possibly related to contrast-induced nephropathy. I have held her lasix since 3/21. Also was given Bactrim for UTI which has been discontinued . Renal function slowly improving.  resume Lasix at a lower dose today (60 mg twice a day, patient is normally at 160 mg twice a day)   Chronic combined systolic and diastolic heart failure:  - compensated. On High dose Lasix but given worsened her renal function which was held and started at t a lower dose.  History of COPD  Continue when necessary nebs   Diabetes mellitus with peripheral neuropathy  On Lantus and pre-meal aspart  Blood glucose in 200's Will increase the dose of lantus to 40 units   Continue sliding scale insulin   Hypertension  Uncontrolled likely with pain. Labetalol dose increased.   History of CAD  continue aspirin, statin and beta blocker   GERD  Continue PPI   OSA on CPAP    DVT Prophylaxis:  sq Lovenox   Code Status: Full code  Family Communication: Daughter at bedside Disposition Plan: CIR evaluating patient.  Discharge per primary team  Procedures:  Left BKA on 3/21  Antibiotics:  Received 3 days of Bactrim  HPI/Subjective:  S/p left BKA on 3/21.feels better today  Objective: Filed Vitals:   09/08/12 0408 09/08/12 1032 09/08/12 1300 09/08/12 1459  BP: 177/69 150/95 116/69 130/55  Pulse: 64 75 61   Temp: 97.6 F (36.4 C)  98.7 F (37.1 C)   TempSrc: Oral  Oral   Resp: 19  19   Height:      Weight: 85.4 kg (188 lb 4.4 oz)     SpO2: 97%  94%     Intake/Output Summary (Last 24 hours) at 09/08/12 1651 Last data filed at 09/08/12 1300  Gross per 24 hour  Intake 1684.29 ml  Output   2300 ml  Net -615.71 ml   Filed Weights   08/29/12 1303 09/01/12 0519 09/08/12 0408  Weight: 96.3 kg (212 lb 4.9 oz) 94.9 kg (209 lb 3.5 oz) 85.4 kg (188 lb 4.4 oz)    Exam:  General: No acute disteress  HEENT: No pallor, moist oral mucosa  Cardiovascular: Normal S1 and S2, no murmurs  Respiratory: Clear to auscultation bilaterally  Abdomen: Soft, nontender, nondistended, bowel sounds present  Musculoskeletal: left BKA with dressing..  CNS: AAO x3, no focal defecit   Data Reviewed: Basic Metabolic Panel:  Recent Labs Lab 09/03/12 1730 09/04/12 1917 09/05/12 0600 09/06/12  1610 09/07/12 0724 09/08/12 0451  NA 135  --  137 139 143 145  K 4.1  --  4.4 4.2 4.3 3.9  CL 94*  --  96 101 106 106  CO2 28  --  26 26 25 29   GLUCOSE 179*  --  223* 178* 189* 183*  BUN 52*  --  55* 41* 29* 27*  CREATININE 2.44* 2.74* 2.42* 2.02* 1.47* 1.40*  CALCIUM 9.4  --  9.0 9.0 8.8 9.0   Liver Function Tests: No results found for this basename: AST, ALT, ALKPHOS, BILITOT, PROT, ALBUMIN,  in the last 168 hours No results found for this basename: LIPASE, AMYLASE,  in the last 168 hours No results found for this basename: AMMONIA,  in the last 168  hours CBC:  Recent Labs Lab 09/04/12 1917 09/05/12 0600 09/06/12 0547  WBC 15.2* 12.4* 12.6*  HGB 13.3 13.0 12.2  HCT 39.8 39.4 37.5  MCV 86.0 87.0 86.6  PLT 325 340 342   Cardiac Enzymes: No results found for this basename: CKTOTAL, CKMB, CKMBINDEX, TROPONINI,  in the last 168 hours BNP (last 3 results)  Recent Labs  05/30/12 1432 06/16/12 1254  PROBNP 3213.0* 168.0*   CBG:  Recent Labs Lab 09/07/12 1616 09/07/12 2101 09/08/12 0557 09/08/12 1121 09/08/12 1609  GLUCAP 182* 162* 175* 251* 245*    Recent Results (from the past 240 hour(s))  URINE CULTURE     Status: None   Collection Time    09/01/12  9:52 AM      Result Value Range Status   Specimen Description URINE, CATHETERIZED   Final   Special Requests NONE   Final   Culture  Setup Time 09/01/2012 10:37   Final   Colony Count NO GROWTH   Final   Culture NO GROWTH   Final   Report Status 09/02/2012 FINAL   Final  SURGICAL PCR SCREEN     Status: None   Collection Time    09/03/12  8:35 PM      Result Value Range Status   MRSA, PCR NEGATIVE  NEGATIVE Final   Staphylococcus aureus NEGATIVE  NEGATIVE Final   Comment:            The Xpert SA Assay (FDA     approved for NASAL specimens     in patients over 57 years of age),     is one component of     a comprehensive surveillance     program.  Test performance has     been validated by [employer] for patients greater     than or equal to 27 year old.     It is not intended     to diagnose infection nor to     guide or monitor treatment.  CULTURE, BLOOD (ROUTINE X 2)     Status: None   Collection Time    09/06/12  1:45 PM      Result Value Range Status   Specimen Description BLOOD LEFT ARM   Final   Special Requests BOTTLES DRAWN AEROBIC ONLY 5CC   Final   Culture  Setup Time 09/06/2012 21:14   Final   Culture     Final   Value:        BLOOD CULTURE RECEIVED NO GROWTH TO DATE CULTURE WILL BE HELD FOR 5 DAYS BEFORE ISSUING A FINAL NEGATIVE  REPORT   Report Status PENDING   Incomplete  CULTURE, BLOOD (ROUTINE X  2)     Status: None   Collection Time    09/06/12  2:00 PM      Result Value Range Status   Specimen Description BLOOD LEFT HAND   Final   Special Requests BOTTLES DRAWN AEROBIC ONLY 3CC   Final   Culture  Setup Time 09/06/2012 21:14   Final   Culture     Final   Value:        BLOOD CULTURE RECEIVED NO GROWTH TO DATE CULTURE WILL BE HELD FOR 5 DAYS BEFORE ISSUING A FINAL NEGATIVE REPORT   Report Status PENDING   Incomplete     Studies: No results found.  Scheduled Meds: . docusate sodium  100 mg Oral Daily  . enoxaparin (LOVENOX) injection  40 mg Subcutaneous Q24H  . feeding supplement  237 mL Oral TID WC  . furosemide  60 mg Oral BID  . gabapentin  300 mg Oral TID  . hydrALAZINE  50 mg Oral Q8H  . insulin aspart  0-15 Units Subcutaneous TID WC  . insulin aspart  5 Units Subcutaneous TID WC  . insulin glargine  35 Units Subcutaneous QHS  . isosorbide dinitrate  30 mg Oral BID  . labetalol  200 mg Oral BID  . OxyCODONE  15 mg Oral Q12H  . pantoprazole  40 mg Oral Q breakfast   Continuous Infusions: . sodium chloride 75 mL/hr at 09/08/12 0541      Time spent: 25 minutes    [facility]  [facility] Pager [phone] If 7PM-7AM, please contact night-coverage at [email or web address], password [facility] 09/08/2012, 4:51 PM  LOS: 10 days

## 2012-09-08 NOTE — Progress Notes (Addendum)
[facility] [place]  Postoperative Visit - Amputation  Date of Surgery: 08/29/2012 - 09/04/2012 Procedure(s): AMPUTATION BELOW KNEE Left Surgeon: Surgeon(s): Chuck Hint, MD POD: 4 Days Post-Op  Subjective [name] is a 79 y.o. female who is S/P Left Procedure(s): AMPUTATION BELOW KNEE.  Pt.denies increased pain in the stump. The patient notes pain is well controlled. Pt. denies phantom pain.  Per nursing patient is alert and denise pain this morning.  Significant Diagnostic Studies: CBC Lab Results  Component Value Date   WBC 12.6* 09/06/2012   HGB 12.2 09/06/2012   HCT 37.5 09/06/2012   MCV 86.6 09/06/2012   PLT 342 09/06/2012    BMET    Component Value Date/Time   NA 145 09/08/2012 0451   K 3.9 09/08/2012 0451   CL 106 09/08/2012 0451   CO2 29 09/08/2012 0451   GLUCOSE 183* 09/08/2012 0451   BUN 27* 09/08/2012 0451   CREATININE 1.40* 09/08/2012 0451   CALCIUM 9.0 09/08/2012 0451   CALCIUM 9.8 02/20/2012 1018   GFRNONAA 34* 09/08/2012 0451   GFRAA 40* 09/08/2012 0451    COAG Lab Results  Component Value Date   INR 0.94 08/29/2012   INR 1.03 05/30/2012   INR 1.0 11/15/2008   No results found for this basename: PTT     Intake/Output Summary (Last 24 hours) at 09/08/12 1011 Last data filed at 09/08/12 0730  Gross per 24 hour  Intake 1324.29 ml  Output   3300 ml  Net -1975.71 ml   No data found.    Physical Examination  BP Readings from Last 3 Encounters:  09/08/12 177/69  09/08/12 177/69  09/08/12 177/69   Temp Readings from Last 3 Encounters:  09/08/12 97.6 F (36.4 C) Oral  09/08/12 97.6 F (36.4 C) Oral  09/08/12 97.6 F (36.4 C) Oral   SpO2 Readings from Last 3 Encounters:  09/08/12 97%  09/08/12 97%  09/08/12 97%   Pulse Readings from Last 3 Encounters:  09/08/12 64  09/08/12 64  09/08/12 64    Pt is A&Ox3  WDWN female with no complaints  Left amputation wound is healing well.  There is good bone  coverage in the stump Stump is warm and well perfused, without drainage; without erythema   Assessment/plan:  [name] is a 79 y.o. female who is s/p Left Procedure(s): AMPUTATION BELOW KNEE  The patient's stump is viable.  Follow-up 4 weeks from surgery I will try to discontinue IV pain medication and use PO pain medication is hopes of getting to SNF soon for rehabilitation.   [name] [facility] 10:11 AM 09/08/[phone]  Agree with above. Amputation site looks good so far. If she is D/C'd to SNF, I will arrange staple removal as an outpt in 3 weeks.  Christina Ferrari, MD, FACS Beeper [phone] 09/08/2012

## 2012-09-08 NOTE — Clinical Social Work Note (Signed)
CSW followed up with Pt and family who are agreeable to SNF.  CSW confirmed SNF bed at [facility], Pt's preferred choice for SNF. [name] very pleased with arrangements and wife's improvements in the hospital.  CSW submitted clinicals to insurance liaison on Monday.  CSW left msg with liaison today for follow up and notification of likely dc on Wednesday if medically stable.    CSW will continue to follow up to assist with dc to [facility] pending medical clearance and insurance approval.   Frederico Hamman, LCSW [phone]

## 2012-09-08 NOTE — Progress Notes (Signed)
Physical Therapy Treatment Patient Details Name: [name] MRN: [id number] DOB: 1929/09/11 Today's Date: 09/08/2012 Time: 4098-1191 PT Time Calculation (min): 39 min  PT Assessment / Plan / Recommendation Comments on Treatment Session  Emphasis placed on assisted ROM of L Leg and educating family on desensitazation activities.  Pt able to standing fully upright with assist and then emphasized importance of transfers and safe technique/    Follow Up Recommendations  SNF;Supervision/Assistance - 24 hour     Does the patient have the potential to tolerate intense rehabilitation     Barriers to Discharge        Equipment Recommendations  Wheelchair (measurements PT);Wheelchair cushion (measurements PT)    Recommendations for Other Services    Frequency Min 3X/week   Plan Frequency remains appropriate;Discharge plan needs to be updated    Precautions / Restrictions Precautions Precautions: Fall Precaution Comments: new left BKA Restrictions Weight Bearing Restrictions: No   Pertinent Vitals/Pain     Mobility  Bed Mobility Bed Mobility: Rolling Right;Rolling Left;Supine to Sit;Sitting - Scoot to [place] of Bed Rolling Right: 2: Max assist Rolling Left: 2: Max assist Supine to Sit: 1: +2 Total assist;HOB flat Supine to Sit: Patient Percentage: 20% Sitting - Scoot to Edge of Bed: 1: +1 Total assist Details for Bed Mobility Assistance: multimodal cue  for safety, hand placement, direction; significant truncal assist Transfers Transfers: Sit to Stand;Stand to Sit;Squat Pivot Transfers Sit to Stand: 1: +2 Total assist;Other (comment);From bed;With upper extremity assist (to full upright standing posture) Sit to Stand: Patient Percentage: 20% Stand to Sit: 1: +2 Total assist;To bed Stand to Sit: Patient Percentage: 40% Squat Pivot Transfers: 1: +2 Total assist Squat Pivot Transfers: Patient Percentage: 40% Details for Transfer Assistance: vc's for transfer set up and  hand placement; assist to come forward over BOS Ambulation/Gait Ambulation/Gait Assistance: Not tested (comment) Stairs: No Wheelchair Mobility Wheelchair Mobility: No    Exercises Amputee Exercises Hip Extension: AAROM;5 reps Hip ABduction/ADduction: AAROM;Left;5 reps Hip Flexion/Marching: AAROM;Left;5 reps Knee Flexion: AAROM;Left;5 reps Knee Extension: AAROM;Left;5 reps   PT Diagnosis:    PT Problem List:   PT Treatment Interventions:     PT Goals Acute Rehab PT Goals Time For Goal Achievement: 09/19/12 Potential to Achieve Goals: Good Pt will Roll Supine to Right Side: with modified independence PT Goal: Rolling Supine to Right Side - Progress: Progressing toward goal Pt will Roll Supine to Left Side: with modified independence PT Goal: Rolling Supine to Left Side - Progress: Progressing toward goal Pt will go Supine/Side to Sit: with supervision;with HOB 0 degrees PT Goal: Supine/Side to Sit - Progress: Progressing toward goal Pt will Sit at [facility] of Bed: 1-2 min;Independently;with no upper extremity support PT Goal: Sit at [facility] Of Bed - Progress: Progressing toward goal Pt will go Sit to Supine/Side: with supervision;with HOB 0 degrees PT Goal: Sit to Supine/Side - Progress: Progressing toward goal Pt will go Sit to Stand: with mod assist PT Goal: Sit to Stand - Progress: Progressing toward goal Pt will Transfer Bed to Chair/Chair to Bed: with +2 total assist PT Transfer Goal: Bed to Chair/Chair to Bed - Progress: Progressing toward goal Pt will Perform Home Exercise Program: with supervision, verbal cues required/provided PT Goal: Perform Home Exercise Program - Progress: Progressing toward goal Pt will Propel Wheelchair: 51 - 150 feet;with min assist PT Goal: Propel Wheelchair - Progress: Not met  Visit Information  Last PT Received On: 09/08/12 Assistance Needed: +2    Subjective Data  Subjective: Mostly moaning and groaning, but stated "I didn't know they were  going to do this.   Cognition  Cognition Overall Cognitive Status: Impaired Arousal/Alertness: Awake/alert Orientation Level: Disoriented to;Place;Time Behavior During Session: WFL for tasks performed    Balance  Balance Balance Assessed: Yes Static Sitting Balance Static Sitting - Balance Support: Feet supported;Bilateral upper extremity supported Static Sitting - Level of Assistance: 3: Mod assist;4: Min assist Static Sitting - Comment/# of Minutes: 6 min  End of Session PT - End of Session Equipment Utilized During Treatment: Gait belt Activity Tolerance: Patient limited by pain;Other (comment) (fear) Patient left: in chair;with call bell/phone within reach;with family/visitor present;Other (comment) (lift pad in place) Nurse Communication: Mobility status;Need for lift equipment   GP     [name], Eliseo Gum 09/08/2012, 12:45 PM 09/08/2012  [place] Bing, PT [phone] (pager)

## 2012-09-08 NOTE — Progress Notes (Signed)
Patient refusing CPAP at this time.  RT made patient aware that if she changed her mind through the night to call and RT would come place patient on CPAP.

## 2012-09-09 ENCOUNTER — Telehealth: Payer: Self-pay | Admitting: Vascular Surgery

## 2012-09-09 DIAGNOSIS — N179 Acute kidney failure, unspecified: Secondary | ICD-10-CM

## 2012-09-09 LAB — GLUCOSE, CAPILLARY: Glucose-Capillary: 182 mg/dL — ABNORMAL HIGH (ref 70–99)

## 2012-09-09 MED ORDER — OXYCODONE HCL 5 MG PO TABS: 5.0000 mg | ORAL_TABLET | ORAL | Status: DC | PRN

## 2012-09-09 MED ORDER — CLONIDINE HCL 0.1 MG PO TABS
0.1000 mg | ORAL_TABLET | Freq: Once | ORAL | Status: AC
  Administered 2012-09-09: 0.1 mg via ORAL
  Filled 2012-09-09: qty 1

## 2012-09-09 MED ORDER — GLUCERNA SHAKE PO LIQD: 237.0000 mL | Freq: Three times a day (TID) | ORAL | Status: DC

## 2012-09-09 MED ORDER — SENNOSIDES-DOCUSATE SODIUM 8.6-50 MG PO TABS
1.0000 | ORAL_TABLET | Freq: Every day | ORAL | Status: DC
  Filled 2012-09-09: qty 1

## 2012-09-09 MED ORDER — OXYCODONE HCL ER 15 MG PO T12A: 15.0000 mg | EXTENDED_RELEASE_TABLET | Freq: Two times a day (BID) | ORAL | Status: DC

## 2012-09-09 NOTE — Telephone Encounter (Addendum)
Message copied by Rosalyn Charters on Wed Sep 09, 2012 10:09 AM ------      Message from: Lamar Blinks S      Created: Wed Sep 09, 2012  9:27 AM      Regarding: FW: f/u       Needs 3 wk f/u w/ CSD            ----- Message -----         From: Chuck Hint, MD         Sent: 09/09/2012   7:46 AM           To: Conley Simmonds Pullins, RN      Subject: f/u                                                      She needs a follow up visit in 3 weeks for staple removal from her left BKA. Thank you.CD  notified patient of fu appt. with dr. fields on 10-08-12 1pm       ------

## 2012-09-09 NOTE — Progress Notes (Signed)
[facility] PROGRESS NOTE  [name]:[id number] DOB: 04/26/1930 DOA: 08/29/2012 PCP: Romero Belling, MD  Brief narrative  79 year old African American female with significant past medical history of diabetes, hypertension,PAD, coronary artery disease, combined systolic and diastolic dysfunction who presented with worsening pain in her left lower extremity. In the ED she was found to have a cold left foot, she was seen then by vascular surgery and admitted to their service. Hospitalist service was asked to evaluate the patient and manage her medical comorbidities.    Assessment/Plan:  Ischemia of the left foot  s/p angiography 3/17,  -s/p left BKA on 3/21 . Tolerated procedure well.  Pain is better controlled today with Oxycontin and prn oxycodone. Will also add neuron tin 300 mg po TID   ?UTI -  Urine culture is negative. Treated with 3 days of Bactrim.now discontinued    Acute on chronic renal failure  Noted following the angiogram. Possibly related to contrast-induced nephropathy. I have held her lasix since 3/21. Also was given Bactrim for UTI which has been discontinued . Renal function improved, close to her baseline. Can resume the home dose of lasix at discharge    Constipation Will give one dose of dulcolax suppository  Chronic combined systolic and diastolic heart failure:  - compensated. Marland Kitchen  History of COPD  Continue when necessary nebs   Diabetes mellitus with peripheral neuropathy  On Lantus and pre-meal aspart  Blood glucose in 200's Restart the home dose of NPH/regular 70/30   Hypertension  Continue labetolol  History of CAD  continue aspirin, statin and beta blocker   GERD  Continue PPI   OSA on CPAP   DVT Prophylaxis:  sq Lovenox   Code Status: Full code  Family Communication: Daughter at bedside Disposition Plan: CIR evaluating patient.  Discharge per primary team  Procedures:  Left BKA on 3/21  Antibiotics:  Received 3  days of Bactrim  HPI/Subjective:  S/p left BKA on 3/21, complains of constipation  Objective: Filed Vitals:   09/08/12 2145 09/09/12 0423 09/09/12 0738 09/09/12 1035  BP:  174/83 188/67 141/48  Pulse: 69 71 58 59  Temp:  97.2 F (36.2 C)    TempSrc:  Oral    Resp: 18 20    Height:      Weight:      SpO2: 95% 93%      Intake/Output Summary (Last 24 hours) at 09/09/12 1332 Last data filed at 09/09/12 0426  Gross per 24 hour  Intake    960 ml  Output   1300 ml  Net   -340 ml   Filed Weights   08/29/12 1303 09/01/12 0519 09/08/12 0408  Weight: 96.3 kg (212 lb 4.9 oz) 94.9 kg (209 lb 3.5 oz) 85.4 kg (188 lb 4.4 oz)    Exam:  General: No acute disteress  HEENT: No pallor, moist oral mucosa  Cardiovascular: Normal S1 and S2, no murmurs   Respiratory: Clear to auscultation bilaterally  Abdomen: Soft, nontender, nondistended, bowel sounds present  Musculoskeletal: left BKA with dressing..  CNS: AAO x3, no focal defecit   Data Reviewed: Basic Metabolic Panel:  Recent Labs Lab 09/03/12 1730 09/04/12 1917 09/05/12 0600 09/06/12 0547 09/07/12 0724 09/08/12 0451  NA 135  --  137 139 143 145  K 4.1  --  4.4 4.2 4.3 3.9  CL 94*  --  96 101 106 106  CO2 28  --  26 26 25 29   GLUCOSE 179*  --  223* 178* 189* 183*  BUN 52*  --  55* 41* 29* 27*  CREATININE 2.44* 2.74* 2.42* 2.02* 1.47* 1.40*  CALCIUM 9.4  --  9.0 9.0 8.8 9.0   Liver Function Tests: No results found for this basename: AST, ALT, ALKPHOS, BILITOT, PROT, ALBUMIN,  in the last 168 hours No results found for this basename: LIPASE, AMYLASE,  in the last 168 hours No results found for this basename: AMMONIA,  in the last 168 hours CBC:  Recent Labs Lab 09/04/12 1917 09/05/12 0600 09/06/12 0547  WBC 15.2* 12.4* 12.6*  HGB 13.3 13.0 12.2  HCT 39.8 39.4 37.5  MCV 86.0 87.0 86.6  PLT 325 340 342   Cardiac Enzymes: No results found for this basename: CKTOTAL, CKMB, CKMBINDEX, TROPONINI,  in the last 168  hours BNP (last 3 results)  Recent Labs  05/30/12 1432 06/16/12 1254  PROBNP 3213.0* 168.0*   CBG:  Recent Labs Lab 09/08/12 1121 09/08/12 1609 09/08/12 2110 09/09/12 0558 09/09/12 1119  GLUCAP 251* 245* 212* 182* 377*    Recent Results (from the past 240 hour(s))  URINE CULTURE     Status: None   Collection Time    09/01/12  9:52 AM      Result Value Range Status   Specimen Description URINE, CATHETERIZED   Final   Special Requests NONE   Final   Culture  Setup Time 09/01/2012 10:37   Final   Colony Count NO GROWTH   Final   Culture NO GROWTH   Final   Report Status 09/02/2012 FINAL   Final  SURGICAL PCR SCREEN     Status: None   Collection Time    09/03/12  8:35 PM      Result Value Range Status   MRSA, PCR NEGATIVE  NEGATIVE Final   Staphylococcus aureus NEGATIVE  NEGATIVE Final   Comment:            The Xpert SA Assay (FDA     approved for NASAL specimens     in patients over 43 years of age),     is one component of     a comprehensive surveillance     program.  Test performance has     been validated by [employer] for patients greater     than or equal to 32 year old.     It is not intended     to diagnose infection nor to     guide or monitor treatment.  CULTURE, BLOOD (ROUTINE X 2)     Status: None   Collection Time    09/06/12  1:45 PM      Result Value Range Status   Specimen Description BLOOD LEFT ARM   Final   Special Requests BOTTLES DRAWN AEROBIC ONLY 5CC   Final   Culture  Setup Time 09/06/2012 21:14   Final   Culture     Final   Value:        BLOOD CULTURE RECEIVED NO GROWTH TO DATE CULTURE WILL BE HELD FOR 5 DAYS BEFORE ISSUING A FINAL NEGATIVE REPORT   Report Status PENDING   Incomplete  CULTURE, BLOOD (ROUTINE X 2)     Status: None   Collection Time    09/06/12  2:00 PM      Result Value Range Status   Specimen Description BLOOD LEFT HAND   Final   Special Requests BOTTLES DRAWN AEROBIC ONLY 3CC   Final   Culture  Setup Time  09/06/2012 21:14   Final   Culture     Final   Value:        BLOOD CULTURE RECEIVED NO GROWTH TO DATE CULTURE WILL BE HELD FOR 5 DAYS BEFORE ISSUING A FINAL NEGATIVE REPORT   Report Status PENDING   Incomplete     Studies: No results found.  Scheduled Meds: . docusate sodium  100 mg Oral Daily  . enoxaparin (LOVENOX) injection  40 mg Subcutaneous Q24H  . feeding supplement  237 mL Oral TID WC  . furosemide  60 mg Oral BID  . gabapentin  300 mg Oral TID  . hydrALAZINE  50 mg Oral Q8H  . insulin aspart  0-15 Units Subcutaneous TID WC  . insulin aspart  5 Units Subcutaneous TID WC  . insulin glargine  40 Units Subcutaneous QHS  . isosorbide dinitrate  30 mg Oral BID  . labetalol  200 mg Oral BID  . OxyCODONE  15 mg Oral Q12H  . pantoprazole  40 mg Oral Q breakfast  . senna-docusate  1 tablet Oral QHS   Continuous Infusions: . sodium chloride 75 mL/hr at 09/08/12 2150      Time spent: 25 minutes    [facility] S  [facility] Pager [phone] If 7PM-7AM, please contact night-coverage at [email or web address], password [facility] 09/09/2012, 1:32 PM  LOS: 11 days

## 2012-09-09 NOTE — Discharge Summary (Signed)
Agree with plans for D/C today.  Waverly Ferrari, MD, FACS Beeper [phone] 09/09/2012

## 2012-09-09 NOTE — Progress Notes (Signed)
VASCULAR PROGRESS NOTE  SUBJECTIVE: No complaints  PHYSICAL EXAM: Filed Vitals:   09/08/12 1929 09/08/12 2134 09/08/12 2145 09/09/12 0423  BP: 119/63 123/61  174/83  Pulse: 67  69 71  Temp: 98.8 F (37.1 C)   97.2 F (36.2 C)  TempSrc: Oral   Oral  Resp: 18  18 20   Height:      Weight:      SpO2: 93%  95% 93%   Left BKA looks fine.  LABS: Lab Results  Component Value Date   WBC 12.6* 09/06/2012   HGB 12.2 09/06/2012   HCT 37.5 09/06/2012   MCV 86.6 09/06/2012   PLT 342 09/06/2012   Lab Results  Component Value Date   CREATININE 1.40* 09/08/2012   Lab Results  Component Value Date   INR 0.94 08/29/2012   CBG (last 3)   Recent Labs  09/08/12 1609 09/08/12 2110 09/09/12 0558  GLUCAP 245* 212* 182*    Principal Problem:   Ischemic foot Active Problems:   DM (diabetes mellitus), type 2, uncontrolled, with renal complications   HYPERLIPIDEMIA   HYPERTENSION   CORONARY ARTERY DISEASE   Chronic combined systolic and diastolic heart failure   PERIPHERAL VASCULAR DISEASE   COPD   GERD   Acute kidney injury   ASSESSMENT AND PLAN: 1. Left BKA healing adequately. 2. Okay for discharge today. I will arrange follow up for staple removal in 3 weeks.   Cari Caraway Beeper: [phone] 09/09/2012

## 2012-09-09 NOTE — Discharge Summary (Signed)
[facility] Discharge Summary   Patient ID:  ORLEAN HOLTROP MRN: [id number] DOB/AGE: 77-Jan-1931 77 y.o.  Admit date: 08/29/2012 Discharge date: 09/09/2012 Date of Surgery: 08/29/2012 - 09/04/2012 Surgeon: Moishe Spice): Chuck Hint, MD  Admission Diagnosis: Ischemic pain of foot, left [729.5, 459.9]  Discharge Diagnoses:  Ischemic pain of foot, left [729.5, 459.9]  Secondary Diagnoses: Past Medical History  Diagnosis Date  . Uterine cancer   . Diabetes mellitus     type II; peripheral neuropathy  . Hypertension   . GERD (gastroesophageal reflux disease)   . Peripheral vascular disease   . Obesity   . Dyslipidemia   . Diastolic CHF, chronic     EF 50-55%, mild LVH and grade 1 diast. Dysfxn  . Osteoarthritis cervical spine   . DM retinopathy   . Sleep apnea     with CPAP  . History of shingles   . COPD (chronic obstructive pulmonary disease)   . Depression   . Peptic ulcer disease     duodenal  . Peptic stricture of esophagus   . Hiatal hernia   . Diverticulosis   . Arthritis   . ASCVD (arteriosclerotic cardiovascular disease)     on MRI brin  . CAD (coronary artery disease)     a. s/p multiple caths with nonobs CAD;   b. cath 1/10: pLAD 20%, mLAD 40%, pCFX 20%, mCFX 40%, pRCA 60-70%;   c.  Myoview 06/02/12: Low anterior wall scar, no ischemia, EF 37%  . Cardiomyopathy     a. Echo 05/31/12: Severe LVH, EF 40-45%, diffuse HK, grade 1 diastolic dysfunction, mild to moderate calcified aortic valve annulus, mild to moderate aortic stenosis, MAC, mild MR, moderate LAE  . Asthma     Mild  . Pruritic condition     Idiopathic  . Hyperlipidemia   . Zoster   . Noncompliance   . CKD (chronic kidney disease)     Dr. Eliott Nine    Procedure(s): AMPUTATION BELOW KNEE  Discharged Condition: stable  HPI: MISAKI SOZIO is a 79 y.o. female states that she has had pain in both feet for many months. She describes constant pain and also  bilateral lower extremity thigh claudication. The pain especially in the left foot, has gradually worsened. She presented to the [facility] emergency department and was found to have a cold left foot. [facility] evaluation. The patient does not describe any sudden increase in pain in the foot but notes that the symptoms have gradually increased. She does describe rest pain in both feet more significantly on the left side. She denies any history of nonhealing wounds. He denies any history of arrhythmias.  Her risk factors for peripheral vascular disease include diabetes, hypertension, hypercholesterolemia. She denies tobacco use and is unaware of any history of premature cardiovascular disease.  She is ambulatory and lives with her husband.     Hospital Course:  XZARIA TEO is a 79 y.o. female is S/P Left Procedure(s): AMPUTATION BELOW KNEE Extubated: POD # 0 Physical exam: Pt is A&Ox3  WDWN female with no complaints  Left amputation wound is healing well.  There is good bone coverage in the stump  Stump is warm and well perfused, without drainage; without erythema  IV infiltration this AM with residua upper arm edema.   Post-op wounds healing well Pt. Ambulating, voiding and taking PO diet without difficulty. Pt pain controlled with PO pain meds. Labs as below Complications:none  Consults:  Meredeth Ide, MD [facility]     Significant Diagnostic Studies: CBC Lab Results  Component Value Date   WBC 12.6* 09/06/2012   HGB 12.2 09/06/2012   HCT 37.5 09/06/2012   MCV 86.6 09/06/2012   PLT 342 09/06/2012    BMET    Component Value Date/Time   NA 145 09/08/2012 0451   K 3.9 09/08/2012 0451   CL 106 09/08/2012 0451   CO2 29 09/08/2012 0451   GLUCOSE 183* 09/08/2012 0451   BUN 27* 09/08/2012 0451   CREATININE 1.40* 09/08/2012 0451   CALCIUM 9.0 09/08/2012 0451   CALCIUM 9.8 02/20/2012 1018   GFRNONAA 34* 09/08/2012 0451   GFRAA 40* 09/08/2012  0451   COAG Lab Results  Component Value Date   INR 0.94 08/29/2012   INR 1.03 05/30/2012   INR 1.0 11/15/2008     Disposition:  Discharge to :Skilled nursing facility Discharge Orders   Future Orders Complete By Expires     Activity as tolerated - No restrictions  As directed     Call MD for:  redness, tenderness, or signs of infection (pain, swelling, bleeding, redness, odor or green/yellow discharge around incision site)  As directed     Call MD for:  severe or increased pain, loss or decreased feeling  in affected limb(s)  As directed     Call MD for:  temperature >100.5  As directed     May shower   As directed     Resume previous diet  As directed         Medication List    TAKE these medications       acetaminophen 500 MG tablet  Commonly known as:  TYLENOL  Take 1,000 mg by mouth every 6 (six) hours as needed. For pain     aspirin EC 325 MG tablet  Take 325 mg by mouth daily.     cholecalciferol 1000 UNITS tablet  Commonly known as:  VITAMIN D  Take 1,000 Units by mouth daily.     ezetimibe 10 MG tablet  Commonly known as:  ZETIA  Take 10 mg by mouth daily.     feeding supplement Liqd  Take 237 mLs by mouth 3 (three) times daily with meals.     furosemide 80 MG tablet  Commonly known as:  LASIX  Take 160 mg by mouth 2 (two) times daily.     gabapentin 300 MG capsule  Commonly known as:  NEURONTIN  Take 300 mg by mouth at bedtime.     hydrALAZINE 25 MG tablet  Commonly known as:  APRESOLINE  Take 25 mg by mouth 3 (three) times daily.     insulin NPH-insulin regular (70-30) 100 UNIT/ML injection  Commonly known as:  NOVOLIN 70/30  Inject 80 Units into the skin daily with breakfast.     isosorbide dinitrate 30 MG tablet  Commonly known as:  ISORDIL  Take 30 mg by mouth 2 (two) times daily.     labetalol 200 MG tablet  Commonly known as:  NORMODYNE  Take 100 mg by mouth 2 (two) times daily.     latanoprost 0.005 % ophthalmic solution  Commonly  known as:  XALATAN  Place 1 drop into both eyes at bedtime.     metolazone 5 MG tablet  Commonly known as:  ZAROXOLYN  Take 1 tablet (5 mg total) by mouth daily as needed. Daily as needed for weight gain over 221 pounds     nitroGLYCERIN 0.4  MG SL tablet  Commonly known as:  NITROSTAT  Place 0.4 mg under the tongue every 5 (five) minutes as needed for chest pain.     OxyCODONE 15 mg T12a  Commonly known as:  OXYCONTIN  Take 1 tablet (15 mg total) by mouth every 12 (twelve) hours.     oxyCODONE 5 MG immediate release tablet  Commonly known as:  Oxy IR/ROXICODONE  Take 1-2 tablets (5-10 mg total) by mouth every 4 (four) hours as needed.     potassium chloride SA 20 MEQ tablet  Commonly known as:  K-DUR,KLOR-CON  Take 40 mEq by mouth 2 (two) times daily.     rosuvastatin 40 MG tablet  Commonly known as:  CRESTOR  Take 40 mg by mouth every morning.       Verbal and written Discharge instructions given to the patient. Wound care per Discharge AVS    Assessment/Plan:  Ischemia of the left foot  s/p angiography 3/17,  -s/p left BKA on 3/21 . Tolerated procedure well.  Pain is better controlled today with Oxycontin and prn oxycodone.  Will also add neuron tin 300 mg po TID  ?UTI -  Urine culture is negative. Treated with 3 days of Bactrim.now discontinued  Fever on 3/22  Low grade fever on 3/24  Afebrile today  Will continue to monitor  Acute on chronic renal failure  Noted following the angiogram. Possibly related to contrast-induced nephropathy. I have held her lasix since 3/21. Also was given Bactrim for UTI which has been discontinued . Renal function slowly improving. resume Lasix at a lower dose today (60 mg twice a day, patient is normally at 160 mg twice a day)  Chronic combined systolic and diastolic heart failure:  - compensated. On High dose Lasix but given worsened her renal function which was held and started at t a lower dose.  History of COPD  Continue when  necessary nebs  Diabetes mellitus with peripheral neuropathy  On Lantus and pre-meal aspart Blood glucose in 200's  Will increase the dose of lantus to 40 units  Continue sliding scale insulin  Hypertension  Uncontrolled likely with pain. Labetalol dose increased.  History of CAD  continue aspirin, statin and beta blocker  GERD  Continue PPI  OSA on CPAP      Code Status: Full code  Procedures:  Left BKA on 3/21  Antibiotics:  Received 3 days of Bactrim UTI treatment HPI/Subjective:  S/p left BKA on 3/21.feels better today F/U with Dr. Edilia Bo 4 weeks after left BKA  Signed: Clinton [facility]ant [facility] 09/09/2012, 7:48 AM

## 2012-09-10 ENCOUNTER — Non-Acute Institutional Stay (SKILLED_NURSING_FACILITY): Payer: Medicare Other | Admitting: Internal Medicine

## 2012-09-10 ENCOUNTER — Other Ambulatory Visit: Payer: Self-pay | Admitting: Geriatric Medicine

## 2012-09-10 DIAGNOSIS — I10 Essential (primary) hypertension: Secondary | ICD-10-CM

## 2012-09-10 DIAGNOSIS — E876 Hypokalemia: Secondary | ICD-10-CM

## 2012-09-10 DIAGNOSIS — E1129 Type 2 diabetes mellitus with other diabetic kidney complication: Secondary | ICD-10-CM

## 2012-09-10 DIAGNOSIS — I5042 Chronic combined systolic (congestive) and diastolic (congestive) heart failure: Secondary | ICD-10-CM

## 2012-09-10 DIAGNOSIS — S88119A Complete traumatic amputation at level between knee and ankle, unspecified lower leg, initial encounter: Secondary | ICD-10-CM | POA: Insufficient documentation

## 2012-09-10 DIAGNOSIS — E1169 Type 2 diabetes mellitus with other specified complication: Secondary | ICD-10-CM

## 2012-09-10 DIAGNOSIS — R2681 Unsteadiness on feet: Secondary | ICD-10-CM | POA: Insufficient documentation

## 2012-09-10 DIAGNOSIS — R269 Unspecified abnormalities of gait and mobility: Secondary | ICD-10-CM

## 2012-09-10 DIAGNOSIS — K219 Gastro-esophageal reflux disease without esophagitis: Secondary | ICD-10-CM

## 2012-09-10 DIAGNOSIS — I152 Hypertension secondary to endocrine disorders: Secondary | ICD-10-CM | POA: Insufficient documentation

## 2012-09-10 DIAGNOSIS — I2510 Atherosclerotic heart disease of native coronary artery without angina pectoris: Secondary | ICD-10-CM

## 2012-09-10 DIAGNOSIS — N058 Unspecified nephritic syndrome with other morphologic changes: Secondary | ICD-10-CM

## 2012-09-10 DIAGNOSIS — G609 Hereditary and idiopathic neuropathy, unspecified: Secondary | ICD-10-CM

## 2012-09-10 DIAGNOSIS — E1165 Type 2 diabetes mellitus with hyperglycemia: Secondary | ICD-10-CM

## 2012-09-10 MED ORDER — OXYCODONE HCL ER 15 MG PO T12A: 15.0000 mg | EXTENDED_RELEASE_TABLET | Freq: Two times a day (BID) | ORAL | Status: DC

## 2012-09-10 MED ORDER — OXYCODONE HCL 5 MG PO TABS: 5.0000 mg | ORAL_TABLET | ORAL | Status: DC | PRN

## 2012-09-10 NOTE — Progress Notes (Signed)
Patient ID: [name], female   DOB: 04/04/1930, 79 y.o.   MRN: [id number]    PCP: Romero Belling, MD  Code Status: full code  Allergies  Allergen Reactions  . Iohexol      Code: RASH, Desc: JENNIFER STATES ON PT'S CHART ALLERGIC TO IV DYE 09/04/07/RM, Onset Date: [id number]     Chief Complaint: new admit post hospitalization 08/29/12- 09/09/12  HPI:  79 y/o female patient with hx of dm with peripheral neuropathy, htn, PVD, chf, sleep apnea on CPAP among others was admitted to hospital on above date with cold left foot and claudication pain and had vascular evaluation. She then underwent left BKA and is here for STR Patient was seen in her room today with her husband and daughter present. She had elevated bp reading yesterday evening and as per staff her bp medications were not provided to her. She also developed chest pain last night which subsided after 2 doses of sublingual NTG. Once the bp medication were given her bp normalized and has been normal this am. She denies any headache, chest pain or abdominal pain at present. She does feel short of breath while lying down. Denies any cough Pain is under control. Had a bowel movement this am Review of Systems: Review of Systems  Constitutional: Negative for fever and chills.  HENT: Negative for congestion.   Eyes: Negative for blurred vision.  Respiratory: Positive for shortness of breath. Negative for cough.   Cardiovascular: Positive for orthopnea and leg swelling. Negative for chest pain and palpitations.  Gastrointestinal: Negative for heartburn, nausea, vomiting and abdominal pain.  Genitourinary: Negative for dysuria.  Musculoskeletal: Negative for falls.  Skin: Negative for itching and rash.  Neurological: Positive for weakness. Negative for dizziness and headaches.  Psychiatric/Behavioral: Negative for depression.    Past Medical History  Diagnosis Date  . Uterine cancer   . Diabetes mellitus     type II; peripheral  neuropathy  . Hypertension   . GERD (gastroesophageal reflux disease)   . Peripheral vascular disease   . Obesity   . Dyslipidemia   . Diastolic CHF, chronic     EF 50-55%, mild LVH and grade 1 diast. Dysfxn  . Osteoarthritis cervical spine   . DM retinopathy   . Sleep apnea     with CPAP  . History of shingles   . COPD (chronic obstructive pulmonary disease)   . Depression   . Peptic ulcer disease     duodenal  . Peptic stricture of esophagus   . Hiatal hernia   . Diverticulosis   . Arthritis   . ASCVD (arteriosclerotic cardiovascular disease)     on MRI brin  . CAD (coronary artery disease)     a. s/p multiple caths with nonobs CAD;   b. cath 1/10: pLAD 20%, mLAD 40%, pCFX 20%, mCFX 40%, pRCA 60-70%;   c.  Myoview 06/02/12: Low anterior wall scar, no ischemia, EF 37%  . Cardiomyopathy     a. Echo 05/31/12: Severe LVH, EF 40-45%, diffuse HK, grade 1 diastolic dysfunction, mild to moderate calcified aortic valve annulus, mild to moderate aortic stenosis, MAC, mild MR, moderate LAE  . Asthma     Mild  . Pruritic condition     Idiopathic  . Hyperlipidemia   . Zoster   . Noncompliance   . CKD (chronic kidney disease)     Dr. Eliott Nine   Past Surgical History  Procedure Laterality Date  . Tubal ligation  1967  .  Knee arthroscopy  10/1998    Left  . Craniotomy  1997    Left for SDH  . Cataract extraction, bilateral  2005  . Hernia repair    . Esophagogastroduodenoscopy  04/04/2004  . Spine surgery      C-spine and lumbar surgery  . Cholecystectomy  2010  . Cardiac catheterization    . Dexa  7/05  . Abdominal hysterectomy    . Amputation Left 09/04/2012    Procedure: AMPUTATION BELOW KNEE;  Surgeon: Chuck Hint, MD;  [facility];  Service: Vascular;  Laterality: Left;   Social History:   reports that she has never smoked. She does not have any smokeless tobacco history on file. She reports that she does not drink alcohol or use illicit drugs.  Family  History  Problem Relation Age of Onset  . Cancer Mother     "Stomach" Cancer  . Diabetes Mother   . Heart disease Mother   . Stomach cancer Mother   . Lymphoma Father   . Kidney disease Paternal Grandmother   . Asthma Other     Medications: Patient's Medications  New Prescriptions   No medications on file  Previous Medications   ACETAMINOPHEN (TYLENOL) 500 MG TABLET    Take 1,000 mg by mouth every 6 (six) hours as needed. For pain   ASPIRIN EC 325 MG TABLET    Take 325 mg by mouth daily.   CHOLECALCIFEROL (VITAMIN D) 1000 UNITS TABLET    Take 1,000 Units by mouth daily.   EZETIMIBE (ZETIA) 10 MG TABLET    Take 10 mg by mouth daily.    FEEDING SUPPLEMENT (GLUCERNA SHAKE) LIQD    Take 237 mLs by mouth 3 (three) times daily with meals.   FUROSEMIDE (LASIX) 80 MG TABLET    Take 160 mg by mouth 2 (two) times daily.   GABAPENTIN (NEURONTIN) 300 MG CAPSULE    Take 300 mg by mouth at bedtime.   HYDRALAZINE (APRESOLINE) 25 MG TABLET    Take 25 mg by mouth 3 (three) times daily.   INSULIN NPH-INSULIN REGULAR (NOVOLIN 70/30) (70-30) 100 UNIT/ML INJECTION    Inject 80 Units into the skin daily with breakfast.   ISOSORBIDE DINITRATE (ISORDIL) 30 MG TABLET    Take 30 mg by mouth 2 (two) times daily.    LABETALOL (NORMODYNE) 200 MG TABLET    Take 100 mg by mouth 2 (two) times daily.    LATANOPROST (XALATAN) 0.005 % OPHTHALMIC SOLUTION    Place 1 drop into both eyes at bedtime.   METOLAZONE (ZAROXOLYN) 5 MG TABLET    Take 1 tablet (5 mg total) by mouth daily as needed. Daily as needed for weight gain over 221 pounds   NITROGLYCERIN (NITROSTAT) 0.4 MG SL TABLET    Place 0.4 mg under the tongue every 5 (five) minutes as needed for chest pain.    OXYCODONE (OXY IR/ROXICODONE) 5 MG IMMEDIATE RELEASE TABLET    Take 1-2 tablets (5-10 mg total) by mouth every 4 (four) hours as needed.   OXYCODONE (OXYCONTIN) 15 MG T12A    Take 1 tablet (15 mg total) by mouth every 12 (twelve) hours.   POTASSIUM CHLORIDE SA  (K-DUR,KLOR-CON) 20 MEQ TABLET    Take 40 mEq by mouth 2 (two) times daily.   ROSUVASTATIN (CRESTOR) 40 MG TABLET    Take 40 mg by mouth every morning.   Modified Medications   No medications on file  Discontinued Medications   No medications  on file     Physical Exam:  Filed Vitals:   09/10/12 1707  BP: 145/72  Pulse: 62  Temp: 97.2 F (36.2 C)  Resp: 16  SpO2: 95%   Physical Exam  Constitutional: She is oriented to person, place, and time and well-developed, well-nourished, and in no distress.  obese  HENT:  Head: Normocephalic and atraumatic.  Nose: Nose normal.  Mouth/Throat: Oropharynx is clear and moist.  Eyes: Pupils are equal, round, and reactive to light.  Neck: Normal range of motion. Neck supple. No JVD present. No tracheal deviation present.  Cardiovascular: Normal rate, regular rhythm and normal heart sounds.   Pulmonary/Chest: Effort normal and breath sounds normal. No respiratory distress. She has no wheezes.  Abdominal: Soft. Bowel sounds are normal. She exhibits no distension. There is no tenderness.  Musculoskeletal: Normal range of motion.  Left BKA, staples in place and site clean  Lymphadenopathy:    She has no cervical adenopathy.  Neurological: She is alert and oriented to person, place, and time.  Skin: Skin is warm and dry. No pallor.  Psychiatric: Affect and judgment normal.    Labs reviewed: 09/10/12 wbc 11.5, hb 12.7, hct 39.7, plt 448, na 138, k 4.3, bun 25, cr 1.18, ca 8, t.chol 116, tg 101, hdl 37, ldl 58, a1c 9.9 Basic Metabolic Panel:  Recent Labs  16/10/96 2025  06/01/12 0514  09/06/12 0547 09/07/12 0724 09/08/12 0451  NA  --   < > 135  < > 139 143 145  K  --   < > 4.8  < > 4.2 4.3 3.9  CL  --   < > 99  < > 101 106 106  CO2  --   < > 23  < > 26 25 29   GLUCOSE  --   < > 234*  < > 178* 189* 183*  BUN  --   < > 31*  < > 41* 29* 27*  CREATININE  --   < > 1.53*  < > 2.02* 1.47* 1.40*  CALCIUM  --   < > 8.7  < > 9.0 8.8 9.0  MG  1.6  --  1.6  --   --   --   --   < > = values in this interval not displayed. Liver Function Tests:  Recent Labs  05/30/12 1432 05/31/12 0335 06/01/12 0514  AST 25 20 20   ALT 19 16 16   ALKPHOS 93 70 78  BILITOT 1.4* 0.9 0.8  PROT 6.9 5.5* 6.1  ALBUMIN 3.2* 2.7* 3.0*    Recent Labs  05/30/12 1432  LIPASE 18   CBC:  Recent Labs  05/30/12 1432  06/16/12 1254 06/19/12 1403  09/04/12 1917 09/05/12 0600 09/06/12 0547  WBC 9.5  < > 8.9 7.4  < > 15.2* 12.4* 12.6*  NEUTROABS 8.0*  --  6.3 5.6  --   --   --   --   HGB 15.6*  < > 14.8 13.7  < > 13.3 13.0 12.2  HCT 47.3*  < > 44.2 42.5  < > 39.8 39.4 37.5  MCV 88.7  < > 88.0 88.2  < > 86.0 87.0 86.6  PLT 274  < > 262.0 279  < > 325 340 342  < > = values in this interval not displayed.   Recent Labs  05/30/12 2025 05/31/12 0335 05/31/12 0922 06/19/12 1403  CKTOTAL 425*  --   --   --   CKMB 6.5*  --   --   --  TROPONINI <0.30 <0.30 <0.30 <0.30   CBG:  Recent Labs  09/08/12 2110 09/09/12 0558 09/09/12 1119  GLUCAP 212* 182* 377*    Assessment/Plan DM (diabetes mellitus), type 2, uncontrolled, with renal complications Poorly controlled. On humalin 70/30 80 u daily at present and a1c 9.9 at present and was 10.7 before in 12/13. On ASA and crestor. Followed by endocrinology as outpatient. Continue current regimen and monitor cbg for now.  Chronic combined systolic and diastolic heart failure Has orthopnea and edema. Lungs clear one xam. Continue zaroxolyn 5 mg daily prn. Monitor daily weight. On fluid restriction.  Lower limb amputation, below knee S/p left BKA for PVD. Staples in place and dressing clean. Continue oxycontin 15 mg q12h with oxy IR 5 mg 1-2 tab q4h prn for pain with ASA for PVD. Also on neurontin for peripheral neuropathy pain  Hypertension associated with diabetes bp currently under control. Was elevated last evening due to missing of medication dose. Continue zetia 10 mg daily with lasix 160  mg bid, hydralazine 25 mg tid, labetalol 100 mg bid and isodril 30 mg bid.  CORONARY ARTERY DISEASE Had chest pain last evening with elevated bp and was likely from demand ischemia in setting of htn. Remains chest pain free at present. Monitor. Continue b blocker, statin, asa, isodril and zetia.  HYPOKALEMIA Recent k level normal. Continue kcl supplement with pt being on lasix  PERIPHERAL NEUROPATHY Continue current regimen of neurontin and monitor.  GERD Symptom under control presently. Continue prilosec  Gait instability Will be working with PT/OT, fall precautions. Will need gait training. Continue wound care. To follow with surgery. Wound healing well at present. Monitor for early signs of infection. Continue pain regimen    Labs/tests ordered- routine labs

## 2012-09-10 NOTE — Assessment & Plan Note (Signed)
Has orthopnea and edema. Lungs clear one xam. Continue zaroxolyn 5 mg daily prn. Monitor daily weight. On fluid restriction.

## 2012-09-10 NOTE — Assessment & Plan Note (Signed)
Poorly controlled. On humalin 70/30 80 u daily at present and a1c 9.9 at present and was 10.7 before in 12/13. On ASA and crestor. Followed by endocrinology as outpatient. Continue current regimen and monitor cbg for now.

## 2012-09-10 NOTE — Assessment & Plan Note (Signed)
Continue current regimen of neurontin and monitor.

## 2012-09-10 NOTE — Assessment & Plan Note (Signed)
Symptom under control presently. Continue prilosec

## 2012-09-10 NOTE — Assessment & Plan Note (Signed)
Recent k level normal. Continue kcl supplement with pt being on lasix

## 2012-09-10 NOTE — Assessment & Plan Note (Signed)
Had chest pain last evening with elevated bp and was likely from demand ischemia in setting of htn. Remains chest pain free at present. Monitor. Continue b blocker, statin, asa, isodril and zetia.

## 2012-09-10 NOTE — Assessment & Plan Note (Signed)
bp currently under control. Was elevated last evening due to missing of medication dose. Continue zetia 10 mg daily with lasix 160 mg bid, hydralazine 25 mg tid, labetalol 100 mg bid and isodril 30 mg bid.

## 2012-09-10 NOTE — Assessment & Plan Note (Signed)
Will be working with PT/OT, fall precautions. Will need gait training. Continue wound care. To follow with surgery. Wound healing well at present. Monitor for early signs of infection. Continue pain regimen

## 2012-09-10 NOTE — Assessment & Plan Note (Addendum)
S/p left BKA for PVD. Staples in place and dressing clean. Continue oxycontin 15 mg q12h with oxy IR 5 mg 1-2 tab q4h prn for pain with ASA for PVD. Also on neurontin for peripheral neuropathy pain

## 2012-09-13 LAB — CULTURE, BLOOD (ROUTINE X 2): Culture: NO GROWTH

## 2012-09-15 ENCOUNTER — Telehealth: Payer: Self-pay | Admitting: Cardiology

## 2012-09-15 NOTE — Telephone Encounter (Signed)
[name] called thinking the office had called pt. According to husband pt is doing fine recovering from the surgery. Pt is aware that he/she ca call if needed.

## 2012-09-15 NOTE — Telephone Encounter (Signed)
New problem   Pt had left below knee amputation and pt is in [employer]. Want to let office know of pt's condition

## 2012-09-15 NOTE — Telephone Encounter (Signed)
Left pt a message to call back. 

## 2012-09-18 ENCOUNTER — Non-Acute Institutional Stay (SKILLED_NURSING_FACILITY): Payer: Medicare Other | Admitting: Nurse Practitioner

## 2012-09-18 DIAGNOSIS — R0789 Other chest pain: Secondary | ICD-10-CM

## 2012-09-18 DIAGNOSIS — M62838 Other muscle spasm: Secondary | ICD-10-CM

## 2012-09-18 DIAGNOSIS — K219 Gastro-esophageal reflux disease without esophagitis: Secondary | ICD-10-CM

## 2012-09-23 ENCOUNTER — Non-Acute Institutional Stay (SKILLED_NURSING_FACILITY): Payer: Medicare Other | Admitting: Nurse Practitioner

## 2012-09-23 ENCOUNTER — Encounter: Payer: Self-pay | Admitting: Nurse Practitioner

## 2012-09-23 DIAGNOSIS — E119 Type 2 diabetes mellitus without complications: Secondary | ICD-10-CM

## 2012-09-23 DIAGNOSIS — R0789 Other chest pain: Secondary | ICD-10-CM

## 2012-09-23 DIAGNOSIS — K219 Gastro-esophageal reflux disease without esophagitis: Secondary | ICD-10-CM

## 2012-09-23 DIAGNOSIS — I509 Heart failure, unspecified: Secondary | ICD-10-CM

## 2012-09-23 MED ORDER — INSULIN NPH ISOPHANE & REGULAR (70-30) 100 UNIT/ML ~~LOC~~ SUSP: 20.0000 [IU] | Freq: Every day | SUBCUTANEOUS | Status: DC

## 2012-09-23 MED ORDER — PANTOPRAZOLE SODIUM 40 MG PO TBEC: 40.0000 mg | DELAYED_RELEASE_TABLET | Freq: Every day | ORAL | Status: DC

## 2012-09-23 MED ORDER — INSULIN NPH ISOPHANE & REGULAR (70-30) 100 UNIT/ML ~~LOC~~ SUSP: 60.0000 [IU] | Freq: Every day | SUBCUTANEOUS | Status: DC

## 2012-09-23 MED ORDER — ISOSORBIDE MONONITRATE ER 30 MG PO TB24: 30.0000 mg | ORAL_TABLET | Freq: Every day | ORAL | Status: DC

## 2012-09-23 MED ORDER — METHOCARBAMOL 500 MG PO TABS: 250.0000 mg | ORAL_TABLET | Freq: Two times a day (BID) | ORAL | Status: DC

## 2012-09-23 MED ORDER — ALUM & MAG HYDROXIDE-SIMETH 200-200-20 MG/5ML PO SUSP: 30.0000 mL | Freq: Every day | ORAL | Status: DC

## 2012-09-23 MED ORDER — OMEPRAZOLE 20 MG PO CPDR: 20.0000 mg | DELAYED_RELEASE_CAPSULE | Freq: Two times a day (BID) | ORAL | Status: DC

## 2012-09-23 NOTE — Addendum Note (Signed)
Addended by: Crissie Sickles on: 09/23/2012 02:56 PM   Modules accepted: Level of Service

## 2012-09-23 NOTE — Progress Notes (Signed)
  Subjective:    Patient ID: [name], female    DOB: 28-Jan-1930, 79 y.o.   MRN: [id number]  HPI Comments: I was asked to see pt today to eval low blood sugars in the evening and mornings. Pt has been refusing Isosorbide because she says it makes her feels dizzy.  - Diabetes: she is on Novolin 70/30 80u sq q am. Cbg was 53 yesterday evening she was shaky,. Was given medpass to drink and on recheck was 78. Her husband says that at home her 70/30 insulin was twice a day. - She has been refusing Isosorbide for past two days, saying that it causes her to be sob and dizzy. She refers to it as "the blue pill". B/p has been stable at 122/64, hr 62. - She was seen a few days ago for atypical chest pain, not relieved with ntg. She did get relief with pain meds and maalox. Today she was complaining of chest pain, the nurse gave her maalox and the pain was resolved.      Review of Systems  Constitutional: Negative.   HENT: Negative.   Eyes: Negative.   Respiratory: Positive for shortness of breath.   Cardiovascular: Positive for chest pain.  Gastrointestinal: Negative for nausea and abdominal pain.  Endocrine: Negative.   Genitourinary: Negative.   Skin: Negative.   Allergic/Immunologic: Negative.   Neurological: Negative.   Psychiatric/Behavioral: Negative.        Objective:   Physical Exam  HENT:  Mouth/Throat: Oropharynx is clear and moist. No oropharyngeal exudate.  Eyes: Pupils are equal, round, and reactive to light.  Cardiovascular: Normal rate and regular rhythm.   Pulmonary/Chest: Effort normal and breath sounds normal.  Abdominal: Soft. Bowel sounds are normal. There is no tenderness.  Neurological: She is alert.  Skin: Skin is warm and dry. She is not diaphoretic.  Psychiatric: She has a normal mood and affect.          Assessment & Plan:  1) d/c isosorbide 2) Imdur ER 30 mg po qd for chf 3) change novolin 70/30 to 60 units q am and 70/30 q pm with dinner. *  make sure pt has snacks to prevent hypoglycemia. 4) d/c prilosec 5) start protonix 40mg  po bid - gerd, hx of PUD and hiatal hernia. 6) maalox 30cc po qd - gerd/

## 2012-09-23 NOTE — Progress Notes (Signed)
  Subjective:    Patient ID: [name], female    DOB: May 02, 1930, 79 y.o.   MRN: [id number]  HPI Comments: She has received NTG sl x 2, w/o relief. She was given maalox 30 cc po x 1, four 81 mg aspirin x 1, Roxicodone 10 mg x 1, Robaxin 500 mg x 1. I went in to check on her and she was finishing her lunch tray and says the pain has resolved, 0/10. She is on prilosec 20mg  po qd. She says she does have a hx of peptic ulcer disease and hiatal hernia. She has chf and is on daily weights, no weight gain per [facility] her weight is stable at 187 lbs.  Chest Pain  The current episode started today. The onset quality is gradual. The problem has been unchanged. The pain is present in the epigastric region. The pain is at a severity of 7/10. The pain is moderate. The quality of the pain is described as burning and tightness. The pain does not radiate. Pertinent negatives include no abdominal pain, back pain, claudication, diaphoresis, dizziness, exertional chest pressure, fever, headaches, irregular heartbeat, leg pain, lower extremity edema, nausea, near-syncope, numbness, orthopnea, palpitations, shortness of breath, sputum production, syncope, vomiting or weakness. Risk factors include lack of exercise, post-menopausal, sedentary lifestyle and being elderly.  Her past medical history is significant for CAD, CHF, diabetes, hyperlipidemia and hypertension. Past medical history comments: hx of hiatal hernia and PUD.      Review of Systems  Constitutional: Negative for fever and diaphoresis.  Respiratory: Negative for sputum production and shortness of breath.   Cardiovascular: Positive for chest pain. Negative for palpitations, orthopnea, claudication, syncope and near-syncope.  Gastrointestinal: Negative for nausea, vomiting and abdominal pain.  Musculoskeletal: Negative for back pain.  Neurological: Negative for dizziness, weakness, numbness and headaches.       Objective:   Physical Exam   Constitutional: She is oriented to person, place, and time. She appears well-developed and well-nourished. No distress.  Eyes: Pupils are equal, round, and reactive to light.  Cardiovascular: Normal rate and regular rhythm.   Pulmonary/Chest: Effort normal and breath sounds normal. No respiratory distress.  Abdominal: Soft. Bowel sounds are normal.  Musculoskeletal:  Left BKA  Neurological: She is alert and oriented to person, place, and time.  Skin: Skin is warm and dry. She is not diaphoretic.  Psychiatric: She has a normal mood and affect.          Assessment & Plan:  1) Chest pain - atypical. Pain relieved with maalox, and meds for pain and spasms. 2) Increase Prilosec to 20 mg po bid - gerd 3) Maalox 30 cc po q am x 4 days for indigestion/gerd. 4) Robaxin 250 mg po bid for muscle spasms.

## 2012-09-25 ENCOUNTER — Other Ambulatory Visit: Payer: Self-pay | Admitting: *Deleted

## 2012-09-25 ENCOUNTER — Encounter: Payer: Self-pay | Admitting: Nurse Practitioner

## 2012-09-25 MED ORDER — ZOLPIDEM TARTRATE 5 MG PO TABS: ORAL_TABLET | ORAL | Status: DC

## 2012-09-28 ENCOUNTER — Encounter: Payer: Self-pay | Admitting: Nurse Practitioner

## 2012-09-29 ENCOUNTER — Encounter: Payer: Self-pay | Admitting: Vascular Surgery

## 2012-09-30 ENCOUNTER — Encounter: Payer: Self-pay | Admitting: Vascular Surgery

## 2012-09-30 ENCOUNTER — Ambulatory Visit (INDEPENDENT_AMBULATORY_CARE_PROVIDER_SITE_OTHER): Payer: Medicare Other | Admitting: Vascular Surgery

## 2012-09-30 VITALS — BP 119/65 | HR 56 | Temp 98.4°F | Resp 16 | Ht 65.0 in | Wt 194.0 lb

## 2012-09-30 DIAGNOSIS — I739 Peripheral vascular disease, unspecified: Secondary | ICD-10-CM

## 2012-09-30 DIAGNOSIS — Z4802 Encounter for removal of sutures: Secondary | ICD-10-CM

## 2012-09-30 NOTE — Progress Notes (Signed)
[facility] POST [facility]IVE OFFICE NOTE  CC:  F/u for surgery  HPI:  This is a 79 y.o. female who is s/p left BKA on 09/04/12.  Her husband states that her incision was looking good until about a week ago when they put a dressing on with tape and when it was removed, she got blisters around her incision.  For the past 2 days, her stump has been wrapped with an ACE wrap.  Her husband states that it was draining when the blisters were present.  She denies any fevers.  She states she has been doing well with physical therapy.  Allergies  Allergen Reactions  . Iohexol      Code: RASH, Desc: JENNIFER STATES ON PT'S CHART ALLERGIC TO IV DYE 09/04/07/RM, Onset Date: [id number]     Current Outpatient Prescriptions  Medication Sig Dispense Refill  . acetaminophen (TYLENOL) 500 MG tablet Take 1,000 mg by mouth every 6 (six) hours as needed. For pain      . alum & mag hydroxide-simeth (MAALOX REGULAR STRENGTH) 200-200-20 MG/5ML suspension Take 30 mLs by mouth daily before breakfast.  355 mL  0  . aspirin EC 325 MG tablet Take 325 mg by mouth daily.      . cholecalciferol (VITAMIN D) 1000 UNITS tablet Take 1,000 Units by mouth daily.      Marland Kitchen ezetimibe (ZETIA) 10 MG tablet Take 10 mg by mouth daily.       . feeding supplement (GLUCERNA SHAKE) LIQD Take 237 mLs by mouth 3 (three) times daily with meals.      . furosemide (LASIX) 80 MG tablet Take 160 mg by mouth 2 (two) times daily.      Marland Kitchen gabapentin (NEURONTIN) 300 MG capsule Take 300 mg by mouth at bedtime.      . hydrALAZINE (APRESOLINE) 25 MG tablet Take 25 mg by mouth 3 (three) times daily.      . insulin NPH-regular (NOVOLIN 70/30) (70-30) 100 UNIT/ML injection Inject 60 Units into the skin daily with breakfast.  10 mL  12  . insulin NPH-regular (NOVOLIN 70/30) (70-30) 100 UNIT/ML injection Inject 20 Units into the skin daily with supper.  10 mL  12  . isosorbide mononitrate (IMDUR) 30 MG 24 hr tablet Take 1 tablet (30 mg total) by  mouth daily.  30 tablet  6  . labetalol (NORMODYNE) 200 MG tablet Take 100 mg by mouth 2 (two) times daily.       Marland Kitchen latanoprost (XALATAN) 0.005 % ophthalmic solution Place 1 drop into both eyes at bedtime.      . methocarbamol (ROBAXIN) 500 MG tablet Take 0.5 tablets (250 mg total) by mouth 2 (two) times daily.      . metolazone (ZAROXOLYN) 5 MG tablet Take 1 tablet (5 mg total) by mouth daily as needed. Daily as needed for weight gain over 221 pounds  30 tablet  3  . nitroGLYCERIN (NITROSTAT) 0.4 MG SL tablet Place 0.4 mg under the tongue every 5 (five) minutes as needed for chest pain.       Marland Kitchen oxyCODONE (OXY IR/ROXICODONE) 5 MG immediate release tablet Take 1-2 tablets (5-10 mg total) by mouth every 4 (four) hours as needed.  240 tablet  0  . OxyCODONE (OXYCONTIN) 15 mg T12A Take 1 tablet (15 mg total) by mouth every 12 (twelve) hours.  60 tablet  0  . pantoprazole (PROTONIX) 40 MG tablet Take 1 tablet (40 mg total) by mouth daily.  30 tablet  3  . potassium chloride SA (K-DUR,KLOR-CON) 20 MEQ tablet Take 40 mEq by mouth 2 (two) times daily.      . rosuvastatin (CRESTOR) 40 MG tablet Take 40 mg by mouth every morning.       Marland Kitchen VOLTAREN 1 % GEL continuous as needed.      . zolpidem (AMBIEN) 5 MG tablet Take 1/2 tablet every night at bedtime for sleep Take 1/2 tablet every night at bedtime as needed if peg tube wakes up before 2 am and not able to sleep  30 tablet  5   No current facility-administered medications for this visit.     ROS:  See HPI  Physical Exam:  Filed Vitals:   09/30/12 1453  BP: 119/65  Pulse: 56  Temp: 98.4 F (36.9 C)  Resp: 16    Incision:  There is eschar in the middle portion of the incision and areas on the anterior flap as well as posterior flap where blisters had been present.  A couple of staples removed and area probed with cotton tip applicator.  There is no tract and no purulent drainage present. Extremities:  Her BKA stump is warm and she has good  movement of her LLE   A/P:  This is a 79 y.o. female here for f/u to her left BKA on 09/04/12  -She does have some area of breakdown on the anterior and posterior portion of the incision, but this does not appear infected and there is no purulent drainage when probed. -will leave staples in today and have her f/u with Dr. Edilia Bo in 2 weeks to re-evaluate wound.  Continue dry gauze dressing with kerlix and ACE wrap and no use of tape.    Doreatha Massed, PA-C [facility] [phone]  Clinic MD:  Pt seen and examined in conjunction with Dr. Edilia Bo.  Agree with above. I removed a few staples and probed the wound with a Q-tip but was unable to get into any deep space infection. We have instructed the nursing home to continue dressing changes with 4 x 4's, Kerlix, and Ace bandage. I'll see her back in 2 weeks and hopefully remove her staples at that time. She knows to call she develops any erythema, drainage, or fevers. She knows to call if she develops any new symptoms.  Waverly Ferrari, MD, FACS Beeper [phone] 09/30/2012

## 2012-10-20 ENCOUNTER — Encounter: Payer: Self-pay | Admitting: Vascular Surgery

## 2012-10-21 ENCOUNTER — Ambulatory Visit (INDEPENDENT_AMBULATORY_CARE_PROVIDER_SITE_OTHER): Payer: Medicare Other | Admitting: Vascular Surgery

## 2012-10-21 ENCOUNTER — Encounter: Payer: Self-pay | Admitting: Vascular Surgery

## 2012-10-21 VITALS — BP 129/41 | HR 66 | Ht 65.0 in | Wt 194.0 lb

## 2012-10-21 DIAGNOSIS — Z4802 Encounter for removal of sutures: Secondary | ICD-10-CM

## 2012-10-21 DIAGNOSIS — I739 Peripheral vascular disease, unspecified: Secondary | ICD-10-CM

## 2012-10-21 NOTE — Progress Notes (Addendum)
[facility]   Subjective  - [name] fever or chills.  She does not have pain in er left leg.    HPI: This is a 79 y.o. female who is s/p left BKA on 09/04/12. Her husband states that her incision was looking good until about a week ago when they put a dressing on with tape and when it was removed, she got a lateral  blisters around her incision with has dried to superficial black eschar and is not draining.  Objective 129/41 66     98%  Well healed incision with lateral healing blistered area.   No drainage. Her stump in warm to touch.  Assessment/Planning: left BKA on 09/04/12  We will remove the staples today and she will keep alight dry dressing over the incision. She will follow up in 2 months for a check up. If she has troubles or concerns she will call sooner. The stump looks well perfused and the superficial wound/blister will scab over and fall off on its own.  [facility] 10/21/2012 2:04 PM  Agree with above. She knows to call she develops any erythema or drainage from the area of concern on the patient's side.  Waverly Ferrari, MD, FACS Beeper [phone] 10/21/2012

## 2012-10-26 ENCOUNTER — Other Ambulatory Visit: Payer: Self-pay | Admitting: *Deleted

## 2012-10-26 MED ORDER — OXYCODONE HCL 20 MG PO TB12: ORAL_TABLET | ORAL | Status: DC

## 2012-11-17 ENCOUNTER — Encounter: Payer: Self-pay | Admitting: Nurse Practitioner

## 2012-11-18 ENCOUNTER — Other Ambulatory Visit: Payer: Self-pay | Admitting: *Deleted

## 2012-11-18 MED ORDER — OXYCODONE HCL 5 MG PO TABS: ORAL_TABLET | ORAL | Status: DC

## 2012-11-19 ENCOUNTER — Other Ambulatory Visit: Payer: Self-pay | Admitting: *Deleted

## 2012-11-19 MED ORDER — OXYCODONE HCL ER 10 MG PO T12A: EXTENDED_RELEASE_TABLET | ORAL | Status: DC

## 2012-12-15 ENCOUNTER — Encounter: Payer: Self-pay | Admitting: Vascular Surgery

## 2012-12-16 ENCOUNTER — Encounter: Payer: Self-pay | Admitting: Vascular Surgery

## 2012-12-16 ENCOUNTER — Ambulatory Visit (INDEPENDENT_AMBULATORY_CARE_PROVIDER_SITE_OTHER): Payer: Medicare Other | Admitting: Vascular Surgery

## 2012-12-16 VITALS — BP 124/78 | HR 76 | Resp 18 | Ht 65.0 in | Wt 202.0 lb

## 2012-12-16 DIAGNOSIS — I739 Peripheral vascular disease, unspecified: Secondary | ICD-10-CM

## 2012-12-16 NOTE — Progress Notes (Signed)
[facility] [place]  Patient name: [name] MRN: [id number] DOB: January 16, 1930 Sex: female  REASON FOR VISIT: follow up after left below the knee amputation.  HPI: [name] is a 79 y.o. female who presented with an ischemic left foot. She underwent an arteriogram which showed severe tibial artery occlusive disease with no options for revascularization. She underwent a left below the knee amputation on 09/04/2012. I last saw her on 10/21/2012 at which time showed small wound in the lateral aspect of her BKA stump. She comes in for a wound check.   REVIEW OF SYSTEMS: Arly.Keller ] denotes positive finding; [  ] denotes negative finding  CARDIOVASCULAR:  [ ]  chest pain   [ ]  dyspnea on exertion    CONSTITUTIONAL:  [ ]  fever   [ ]  chills  PHYSICAL EXAM: Filed Vitals:   12/16/12 1305  BP: 124/78  Pulse: 76  Resp: 18  Height: 5\' 5"  (1.651 m)  Weight: 202 lb (91.627 kg)   Body mass index is 33.61 kg/(m^2). GENERAL: The patient is a well-nourished female, in no acute distress. The vital signs are documented above. CARDIOVASCULAR: There is a regular rate and rhythm  PULMONARY: There is good air exchange bilaterally without wheezing or rales. There was a small eschar over the wound on the lateral aspect of her left BKA. I removed this. There is a small 1 cm area of granulation which I think will heal without any difficulty. The stump appears adequately perfused. Right foot is adequately perfused and there are no ulcers on the right foot.  MEDICAL ISSUES: The nursing home will apply bacitracin and a Band-Aid to the wound on the lateral aspect of her left BKA. Once this is healed she can be fitted for a stump shrinker and start working towards obtaining a prosthesis. I'll see her back in 6 months with ABIs in the right lower extremity in order to keep a close eye on her right foot. Currently she has no ulcers.  DICKSON,CHRISTOPHER S [facility]  [facility] [place] Beeper: [phone]

## 2012-12-21 ENCOUNTER — Telehealth: Payer: Self-pay

## 2012-12-21 NOTE — Telephone Encounter (Signed)
Pt needs to make an appointment to f/u with Dr.Ellison, please call [phone]

## 2012-12-22 ENCOUNTER — Other Ambulatory Visit: Payer: Self-pay | Admitting: *Deleted

## 2012-12-22 DIAGNOSIS — I739 Peripheral vascular disease, unspecified: Secondary | ICD-10-CM

## 2012-12-22 NOTE — Telephone Encounter (Signed)
LM for pt to call and schedule an appt with Dr Everardo All for a FU/jkeith

## 2012-12-29 ENCOUNTER — Ambulatory Visit (INDEPENDENT_AMBULATORY_CARE_PROVIDER_SITE_OTHER): Payer: Medicare Other | Admitting: Physician Assistant

## 2012-12-29 ENCOUNTER — Encounter: Payer: Self-pay | Admitting: Physician Assistant

## 2012-12-29 VITALS — BP 136/60 | HR 60 | Ht 66.0 in | Wt 200.0 lb

## 2012-12-29 DIAGNOSIS — I2510 Atherosclerotic heart disease of native coronary artery without angina pectoris: Secondary | ICD-10-CM

## 2012-12-29 DIAGNOSIS — I739 Peripheral vascular disease, unspecified: Secondary | ICD-10-CM

## 2012-12-29 DIAGNOSIS — I10 Essential (primary) hypertension: Secondary | ICD-10-CM

## 2012-12-29 DIAGNOSIS — N189 Chronic kidney disease, unspecified: Secondary | ICD-10-CM

## 2012-12-29 DIAGNOSIS — I5042 Chronic combined systolic (congestive) and diastolic (congestive) heart failure: Secondary | ICD-10-CM

## 2012-12-29 NOTE — Patient Instructions (Addendum)
Your physician wants you to follow-up in: 6 months with Dr Delton See or Tereso Newcomer, PAc. ( January 2015). You will receive a reminder letter in the mail two months in advance. If you don't receive a letter, please call our office to schedule the follow-up appointment.

## 2012-12-29 NOTE — Progress Notes (Addendum)
[address] [place], [place]  [address] Phone: [phone] Fax:  [phone]  Date:  12/29/2012   ID:  [name], DOB 02-29-1930, MRN [id number]  PCP:  Christina Belling, MD  Cardiologist:  Dr. Valera [name]     History of Present Illness: [name] is a 79 y.o. female who returns for f/u.    She has a h/o diastolic CHF, nonobstructive CAD, DM2, HTN, HL, sleep apnea and CKD. Admitted to [facility] 08/2010 with a/c diastolic CHF and with elevated enzymes that were 2/2 CHF + CKD. Admitted with CP in 05/2012.  She r/o for MI.  Echo 05/31/12: Severe LVH, EF 40-45%, diffuse HK, grade 1 diastolic dysfunction, mild to moderate calcified aortic valve annulus, mild to moderate aortic stenosis, MAC, mild MR, moderate LAE.  Myoview was arranged for risk stratification. Myoview 06/02/12: Low anterior wall scar, no ischemia, EF 37%. It is felt that she would be high risk for contrast-induced nephropathy given her CKD.  Last seen in this office 06/16/2012.  Since then, she presented to the hospital in 08/2012 with an ischemic left leg.  She ultimately underwent L BKA with Dr. Edilia Bo.  She is now home from Rehab.  She is doing well.  Denies significant DOE.  No chest pain.  No syncope.  Sleeps on 2 pillows without change.  No PND.  No RLE edema.  She is taking all her medications.    Labs (12/13): K 4.3, creatinine 1.40, ALT 16, Hgb 13.1, TSH 1.576  Labs (3/14):   K 3.9, Cr 1.40 (peak 2.74), Hgb 12.2 Labs (7/14):   K 3.3, Cr 2.08, ALT 19, Hgb 12.7  Wt Readings from Last 3 Encounters:  12/29/12 200 lb (90.719 kg)  12/16/12 202 lb (91.627 kg)  10/21/12 194 lb (87.998 kg)     Past Medical History  Diagnosis Date  . Uterine cancer   . Diabetes mellitus     type II; peripheral neuropathy  . Hypertension   . GERD (gastroesophageal reflux disease)   . Peripheral vascular disease     s/p L BKA 08/2012  . Obesity   . Dyslipidemia   . Diastolic CHF, chronic     EF  53-66%, mild LVH and grade 1 diast. Dysfxn  . Osteoarthritis cervical spine   . DM retinopathy   . Sleep apnea     with CPAP  . History of shingles   . COPD (chronic obstructive pulmonary disease)   . Depression   . Peptic ulcer disease     duodenal  . Peptic stricture of esophagus   . Hiatal hernia   . Diverticulosis   . Arthritis   . ASCVD (arteriosclerotic cardiovascular disease)     on MRI brin  . CAD (coronary artery disease)     a. s/p multiple caths with nonobs CAD;   b. cath 1/10: pLAD 20%, mLAD 40%, pCFX 20%, mCFX 40%, pRCA 60-70%;   c.  Myoview 06/02/12: Low anterior wall scar, no ischemia, EF 37%  . Cardiomyopathy     a. Echo 05/31/12: Severe LVH, EF 40-45%, diffuse HK, grade 1 diastolic dysfunction, mild to moderate calcified aortic valve annulus, mild to moderate aortic stenosis, MAC, mild MR, moderate LAE  . Asthma     Mild  . Pruritic condition     Idiopathic  . Hyperlipidemia   . Zoster   . Noncompliance   . CKD (chronic kidney disease)     Dr. Eliott [name]    Current  Outpatient Prescriptions  Medication Sig Dispense Refill  . acetaminophen (TYLENOL) 500 MG tablet Take 1,000 mg by mouth every 6 (six) hours as needed. For pain      . aspirin EC 325 MG tablet Take 325 mg by mouth daily.      . cholecalciferol (VITAMIN D) 1000 UNITS tablet Take 1,000 Units by mouth daily.      Marland Kitchen docusate sodium (COLACE) 100 MG capsule Take 100 mg by mouth 2 (two) times daily.      Marland Kitchen ezetimibe (ZETIA) 10 MG tablet Take 10 mg by mouth daily.       . feeding supplement (GLUCERNA SHAKE) LIQD Take 237 mLs by mouth 3 (three) times daily with meals.      . gabapentin (NEURONTIN) 300 MG capsule Take 300 mg by mouth at bedtime.      . hydrALAZINE (APRESOLINE) 25 MG tablet Take 25 mg by mouth 3 (three) times daily.      Marland Kitchen HYDROcodone-acetaminophen (NORCO/VICODIN) 5-325 MG per tablet       . insulin NPH-regular (NOVOLIN 70/30) (70-30) 100 UNIT/ML injection Inject 25 Units into the skin daily  with breakfast.      . insulin NPH-regular (NOVOLIN 70/30) (70-30) 100 UNIT/ML injection Inject 25 Units into the skin daily with supper.      . isosorbide mononitrate (IMDUR) 30 MG 24 hr tablet Take 1 tablet (30 mg total) by mouth daily.  30 tablet  6  . labetalol (NORMODYNE) 200 MG tablet Take 100 mg by mouth 2 (two) times daily.       Marland Kitchen latanoprost (XALATAN) 0.005 % ophthalmic solution Place 1 drop into both eyes at bedtime.      . methocarbamol (ROBAXIN) 500 MG tablet Take 0.5 tablets (250 mg total) by mouth 2 (two) times daily.      . metolazone (ZAROXOLYN) 5 MG tablet Take 1 tablet (5 mg total) by mouth daily as needed. Daily as needed for weight gain over 221 pounds  30 tablet  3  . nitroGLYCERIN (NITROSTAT) 0.4 MG SL tablet Place 0.4 mg under the tongue every 5 (five) minutes as needed for chest pain.       Marland Kitchen oxyCODONE (OXY IR/ROXICODONE) 5 MG immediate release tablet Take one tablet by mouth every 8 hours as needed for breakthrough pain  90 tablet  0  . OxyCODONE (OXYCONTIN) 10 mg T12A Take one tablet by mouth twice daily for pain. Do not crush  60 tablet  0  . potassium chloride SA (K-DUR,KLOR-CON) 20 MEQ tablet Take 40 mEq by mouth 2 (two) times daily.      . rosuvastatin (CRESTOR) 40 MG tablet Take 40 mg by mouth every morning.       . sertraline (ZOLOFT) 100 MG tablet Take 100 mg by mouth daily.      . sorbitol 70 % solution Take 15 mLs by mouth every other day.      . torsemide (DEMADEX) 20 MG tablet Take 20 mg by mouth 2 (two) times daily.      . VOLTAREN 1 % GEL continuous as needed.      . zolpidem (AMBIEN) 5 MG tablet Take 1/2 tablet every night at bedtime for sleep Take 1/2 tablet every night at bedtime as needed if peg tube wakes up before 2 am and not able to sleep  30 tablet  5   No current facility-administered medications for this visit.    Allergies:    Allergies  Allergen Reactions  . Iohexol  Code: RASH, Desc: [name] STATES ON PT'S CHART ALLERGIC TO IV DYE  09/04/07/RM, Onset Date: [id number]     Social History:  The patient  reports that she has never smoked. She has never used smokeless tobacco. She reports that she does not drink alcohol or use illicit drugs.   ROS:  Please see the history of present illness.      All other systems reviewed and negative.   PHYSICAL EXAM: VS:  BP 136/60  Pulse 60  Ht 5\' 6"  (1.676 m)  Wt 200 lb (90.719 kg)  BMI 32.3 kg/m2 Well nourished, well developed, in no acute distress HEENT: normal Neck: no JVD at 90 Cardiac:  normal S1, S2; RRR; no murmur Lungs:  clear to auscultation bilaterally, no wheezing, rhonchi or rales Abd: soft, nontender Ext: no RLE edema Skin: warm and dry Neuro:  CNs 2-12 intact, no focal abnormalities noted  EKG:  NSR, HR 60, inferolateral T wave changes, no significant change since prior tracing, QTc 490     ASSESSMENT AND PLAN:  1. CAD:  No angina.  Continue ASA and statin.   2. Cardiomyopathy:  No ACE or ARB due to CKD.  Continue beta blocker, hydralazine, nitrates.   3. Chronic Combined Systolic and Diastolic CHF:  Volume stable.  Continue current Rx.  Obtain recent BMET done at Dr. Elza Rafter office. 4. CKD:  Followed by nephrology. 5. Hypertension:  Controlled.  Continue current therapy.  6. Hyperlipidemia:  Continue statin. 7. PAD, s/p L BKA:  F/u with [facility] as planned.  8. Disposition:  F/u in 6 mos.  Will establish her with Dr. Delton See in light of Dr. Dory Horn retirement.    Signed, Christina Newcomer, PA-C  12/29/2012 11:35 AM

## 2012-12-30 ENCOUNTER — Other Ambulatory Visit: Payer: Self-pay | Admitting: Nurse Practitioner

## 2012-12-30 ENCOUNTER — Other Ambulatory Visit: Payer: Self-pay | Admitting: *Deleted

## 2012-12-30 DIAGNOSIS — I739 Peripheral vascular disease, unspecified: Secondary | ICD-10-CM

## 2012-12-31 ENCOUNTER — Other Ambulatory Visit: Payer: Self-pay | Admitting: Endocrinology

## 2013-01-01 ENCOUNTER — Telehealth: Payer: Self-pay

## 2013-01-01 NOTE — Telephone Encounter (Signed)
No, too long since last ov. Ov needed

## 2013-01-01 NOTE — Telephone Encounter (Signed)
Called Debbie with [facility] [phone]) and she asked for verbal orders for pt to receive home health aid 2 times a week for 5 weeks. When there is a verbal approval, they will fax forms over for Dr to sign. Please advise.

## 2013-01-01 NOTE — Telephone Encounter (Signed)
Debbie with [facility] called [facility] requesting a call back regarding verbal aid orders for pt. Thanks

## 2013-01-04 ENCOUNTER — Emergency Department (HOSPITAL_COMMUNITY)
Admission: EM | Admit: 2013-01-04 | Discharge: 2013-01-04 | Disposition: A | Discharge status: Home / Self Care | Payer: No Typology Code available for payment source | Attending: Emergency Medicine | Admitting: Emergency Medicine

## 2013-01-04 ENCOUNTER — Encounter (HOSPITAL_COMMUNITY): Payer: Self-pay | Admitting: *Deleted

## 2013-01-04 ENCOUNTER — Emergency Department (HOSPITAL_COMMUNITY): Payer: No Typology Code available for payment source

## 2013-01-04 DIAGNOSIS — E785 Hyperlipidemia, unspecified: Secondary | ICD-10-CM | POA: Insufficient documentation

## 2013-01-04 DIAGNOSIS — S88119A Complete traumatic amputation at level between knee and ankle, unspecified lower leg, initial encounter: Secondary | ICD-10-CM | POA: Insufficient documentation

## 2013-01-04 DIAGNOSIS — Z8711 Personal history of peptic ulcer disease: Secondary | ICD-10-CM | POA: Insufficient documentation

## 2013-01-04 DIAGNOSIS — G909 Disorder of the autonomic nervous system, unspecified: Secondary | ICD-10-CM | POA: Insufficient documentation

## 2013-01-04 DIAGNOSIS — I5032 Chronic diastolic (congestive) heart failure: Secondary | ICD-10-CM | POA: Insufficient documentation

## 2013-01-04 DIAGNOSIS — E1139 Type 2 diabetes mellitus with other diabetic ophthalmic complication: Secondary | ICD-10-CM | POA: Insufficient documentation

## 2013-01-04 DIAGNOSIS — Z9119 Patient's noncompliance with other medical treatment and regimen: Secondary | ICD-10-CM | POA: Insufficient documentation

## 2013-01-04 DIAGNOSIS — F329 Major depressive disorder, single episode, unspecified: Secondary | ICD-10-CM | POA: Insufficient documentation

## 2013-01-04 DIAGNOSIS — Z794 Long term (current) use of insulin: Secondary | ICD-10-CM | POA: Insufficient documentation

## 2013-01-04 DIAGNOSIS — Z8619 Personal history of other infectious and parasitic diseases: Secondary | ICD-10-CM | POA: Insufficient documentation

## 2013-01-04 DIAGNOSIS — Z7982 Long term (current) use of aspirin: Secondary | ICD-10-CM | POA: Insufficient documentation

## 2013-01-04 DIAGNOSIS — E11319 Type 2 diabetes mellitus with unspecified diabetic retinopathy without macular edema: Secondary | ICD-10-CM | POA: Insufficient documentation

## 2013-01-04 DIAGNOSIS — Z8679 Personal history of other diseases of the circulatory system: Secondary | ICD-10-CM | POA: Insufficient documentation

## 2013-01-04 DIAGNOSIS — N189 Chronic kidney disease, unspecified: Secondary | ICD-10-CM | POA: Insufficient documentation

## 2013-01-04 DIAGNOSIS — Z79899 Other long term (current) drug therapy: Secondary | ICD-10-CM | POA: Insufficient documentation

## 2013-01-04 DIAGNOSIS — E1149 Type 2 diabetes mellitus with other diabetic neurological complication: Secondary | ICD-10-CM | POA: Insufficient documentation

## 2013-01-04 DIAGNOSIS — F3289 Other specified depressive episodes: Secondary | ICD-10-CM | POA: Insufficient documentation

## 2013-01-04 DIAGNOSIS — I129 Hypertensive chronic kidney disease with stage 1 through stage 4 chronic kidney disease, or unspecified chronic kidney disease: Secondary | ICD-10-CM | POA: Insufficient documentation

## 2013-01-04 DIAGNOSIS — M479 Spondylosis, unspecified: Secondary | ICD-10-CM | POA: Insufficient documentation

## 2013-01-04 DIAGNOSIS — J449 Chronic obstructive pulmonary disease, unspecified: Secondary | ICD-10-CM | POA: Insufficient documentation

## 2013-01-04 DIAGNOSIS — Z9861 Coronary angioplasty status: Secondary | ICD-10-CM | POA: Insufficient documentation

## 2013-01-04 DIAGNOSIS — R42 Dizziness and giddiness: Secondary | ICD-10-CM | POA: Insufficient documentation

## 2013-01-04 DIAGNOSIS — Z8719 Personal history of other diseases of the digestive system: Secondary | ICD-10-CM | POA: Insufficient documentation

## 2013-01-04 DIAGNOSIS — W19XXXA Unspecified fall, initial encounter: Secondary | ICD-10-CM

## 2013-01-04 DIAGNOSIS — G4730 Sleep apnea, unspecified: Secondary | ICD-10-CM | POA: Insufficient documentation

## 2013-01-04 DIAGNOSIS — Z8541 Personal history of malignant neoplasm of cervix uteri: Secondary | ICD-10-CM | POA: Insufficient documentation

## 2013-01-04 DIAGNOSIS — M129 Arthropathy, unspecified: Secondary | ICD-10-CM | POA: Insufficient documentation

## 2013-01-04 DIAGNOSIS — K219 Gastro-esophageal reflux disease without esophagitis: Secondary | ICD-10-CM | POA: Insufficient documentation

## 2013-01-04 DIAGNOSIS — Y9389 Activity, other specified: Secondary | ICD-10-CM | POA: Insufficient documentation

## 2013-01-04 DIAGNOSIS — Z872 Personal history of diseases of the skin and subcutaneous tissue: Secondary | ICD-10-CM | POA: Insufficient documentation

## 2013-01-04 DIAGNOSIS — S0990XA Unspecified injury of head, initial encounter: Secondary | ICD-10-CM | POA: Insufficient documentation

## 2013-01-04 DIAGNOSIS — I2510 Atherosclerotic heart disease of native coronary artery without angina pectoris: Secondary | ICD-10-CM | POA: Insufficient documentation

## 2013-01-04 DIAGNOSIS — J4489 Other specified chronic obstructive pulmonary disease: Secondary | ICD-10-CM | POA: Insufficient documentation

## 2013-01-04 DIAGNOSIS — Z91199 Patient's noncompliance with other medical treatment and regimen due to unspecified reason: Secondary | ICD-10-CM | POA: Insufficient documentation

## 2013-01-04 DIAGNOSIS — Y9289 Other specified places as the place of occurrence of the external cause: Secondary | ICD-10-CM | POA: Insufficient documentation

## 2013-01-04 DIAGNOSIS — E669 Obesity, unspecified: Secondary | ICD-10-CM | POA: Insufficient documentation

## 2013-01-04 MED ORDER — TRAMADOL HCL 50 MG PO TABS: 50.0000 mg | ORAL_TABLET | Freq: Four times a day (QID) | ORAL | Status: DC | PRN

## 2013-01-04 MED ORDER — TRAMADOL HCL 50 MG PO TABS
50.0000 mg | ORAL_TABLET | Freq: Once | ORAL | Status: AC
  Administered 2013-01-04: 50 mg via ORAL
  Filled 2013-01-04: qty 1
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
## 2013-01-04 NOTE — ED Notes (Signed)
[id number]<WG> Expected date:<BR> Expected time:<BR> Means of arrival:<BR> Comments:<BR> ems- 79 yo, Fall, hit head
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
## 2013-01-05 NOTE — ED Provider Notes (Signed)
Medical screening examination/treatment/procedure(s) were performed by non-physician practitioner and as supervising physician I was immediately available for consultation/collaboration.  Mliss Wedin T Maury Groninger, MD 01/05/13 0757 

## 2013-01-06 ENCOUNTER — Telehealth: Payer: Self-pay | Admitting: *Deleted

## 2013-01-06 NOTE — Telephone Encounter (Signed)
[facility] neurology left vm stating that patient canceled visits for the week. Patient stated that she is unable to make it. Patient also said she was poor and could not come so often. Therapist stated that she will try to get patient back in this week to see her. Therapist Byrd Hesselbach was just giving you an FYI on patient. Any questions call [phone]

## 2013-01-07 NOTE — Telephone Encounter (Signed)
Left detailed message on machine for [facility]

## 2013-01-08 ENCOUNTER — Encounter: Payer: Self-pay | Admitting: Endocrinology

## 2013-01-08 ENCOUNTER — Ambulatory Visit (INDEPENDENT_AMBULATORY_CARE_PROVIDER_SITE_OTHER): Payer: Medicare Other | Admitting: Endocrinology

## 2013-01-08 VITALS — BP 124/74 | HR 80

## 2013-01-08 DIAGNOSIS — Z79899 Other long term (current) drug therapy: Secondary | ICD-10-CM

## 2013-01-08 DIAGNOSIS — E559 Vitamin D deficiency, unspecified: Secondary | ICD-10-CM

## 2013-01-08 DIAGNOSIS — G609 Hereditary and idiopathic neuropathy, unspecified: Secondary | ICD-10-CM

## 2013-01-08 DIAGNOSIS — E1129 Type 2 diabetes mellitus with other diabetic kidney complication: Secondary | ICD-10-CM

## 2013-01-08 DIAGNOSIS — E1165 Type 2 diabetes mellitus with hyperglycemia: Secondary | ICD-10-CM

## 2013-01-08 DIAGNOSIS — N259 Disorder resulting from impaired renal tubular function, unspecified: Secondary | ICD-10-CM

## 2013-01-08 DIAGNOSIS — N058 Unspecified nephritic syndrome with other morphologic changes: Secondary | ICD-10-CM

## 2013-01-08 LAB — CBC WITH DIFFERENTIAL/PLATELET
Basophils Absolute: 0 10*3/uL (ref 0.0–0.1)
Lymphocytes Relative: 24.2 % (ref 12.0–46.0)
Monocytes Relative: 8.4 % (ref 3.0–12.0)
Neutrophils Relative %: 65.1 % (ref 43.0–77.0)
Platelets: 264 10*3/uL (ref 150.0–400.0)
RDW: 15.4 % — ABNORMAL HIGH (ref 11.5–14.6)
WBC: 7.8 10*3/uL (ref 4.5–10.5)

## 2013-01-08 NOTE — Progress Notes (Signed)
Subjective:    Patient ID: [name], female    DOB: 08-Mar-1930, 79 y.o.   MRN: [id number]  HPI Pt returns for f/u of insulin-requiring DM (dx'ed 1985; she has moderate neuropathy of the lower extremities; she has associated nephropathy, CAD, and PAD; therapy has limited by pt's request for least expensive insulin, and by her need for a simple regimen; she was seen in ER in early 2014, with severe hypoglycemia).  Husband gives her none in the morning, and 45 units with the evening meal (as he gives it bid).  He says 25 units bid was too much, as she was having frequent hypoglycemia on this. Past Medical History  Diagnosis Date  . Uterine cancer   . Diabetes mellitus     type II; peripheral neuropathy  . Hypertension   . GERD (gastroesophageal reflux disease)   . Peripheral vascular disease     s/p L BKA 08/2012  . Obesity   . Dyslipidemia   . Diastolic CHF, chronic     EF 50-55%, mild LVH and grade 1 diast. Dysfxn  . Osteoarthritis cervical spine   . DM retinopathy   . Sleep apnea     with CPAP  . History of shingles   . COPD (chronic obstructive pulmonary disease)   . Depression   . Peptic ulcer disease     duodenal  . Peptic stricture of esophagus   . Hiatal hernia   . Diverticulosis   . Arthritis   . ASCVD (arteriosclerotic cardiovascular disease)     on MRI brin  . CAD (coronary artery disease)     a. s/p multiple caths with nonobs CAD;   b. cath 1/10: pLAD 20%, mLAD 40%, pCFX 20%, mCFX 40%, pRCA 60-70%;   c.  Myoview 06/02/12: Low anterior wall scar, no ischemia, EF 37%  . Cardiomyopathy     a. Echo 05/31/12: Severe LVH, EF 40-45%, diffuse HK, grade 1 diastolic dysfunction, mild to moderate calcified aortic valve annulus, mild to moderate aortic stenosis, MAC, mild MR, moderate LAE  . Asthma     Mild  . Pruritic condition     Idiopathic  . Hyperlipidemia   . Zoster   . Noncompliance   . CKD (chronic kidney disease)     Dr. Eliott Nine    Past Surgical  History  Procedure Laterality Date  . Tubal ligation  1967  . Knee arthroscopy  10/1998    Left  . Craniotomy  1997    Left for SDH  . Cataract extraction, bilateral  2005  . Hernia repair    . Esophagogastroduodenoscopy  04/04/2004  . Spine surgery      C-spine and lumbar surgery  . Cholecystectomy  2010  . Cardiac catheterization    . Dexa  7/05  . Abdominal hysterectomy    . Amputation Left 09/04/2012    Procedure: AMPUTATION BELOW KNEE;  Surgeon: Chuck Hint, MD;  Location: [facility] OR;  Service: Vascular;  Laterality: Left;    History   Social History  . Marital Status: Married    Spouse Name: N/A    Number of Children: 7  . Years of Education: N/A   Occupational History  . Retired    Social History Main Topics  . Smoking status: Never Smoker   . Smokeless tobacco: Never Used  . Alcohol Use: No     Comment: rare  . Drug Use: No  . Sexually Active: No   Other Topics Concern  . Not on  file   Social History Narrative  . No narrative on file    Current Outpatient Prescriptions on File Prior to Visit  Medication Sig Dispense Refill  . acetaminophen (TYLENOL) 500 MG tablet Take 500 mg by mouth every 6 (six) hours as needed for pain. For pain      . aspirin EC 81 MG tablet Take 81 mg by mouth every morning.      . docusate sodium (COLACE) 100 MG capsule Take 100 mg by mouth 2 (two) times daily as needed for constipation.       Marland Kitchen ezetimibe (ZETIA) 10 MG tablet Take 10 mg by mouth every morning.      . feeding supplement (GLUCERNA SHAKE) LIQD Take 237 mLs by mouth 2 (two) times daily.      Marland Kitchen gabapentin (NEURONTIN) 300 MG capsule Take 300 mg by mouth at bedtime.      . hydrALAZINE (APRESOLINE) 25 MG tablet Take 25 mg by mouth 2 (two) times daily.       Marland Kitchen HYDROcodone-acetaminophen (NORCO/VICODIN) 5-325 MG per tablet 1 tablet every 6 (six) hours as needed for pain.       Marland Kitchen insulin NPH-regular (NOVOLIN 70/30) (70-30) 100 UNIT/ML injection Inject 20 Units into the  skin 2 (two) times daily with a meal.       . isosorbide mononitrate (IMDUR) 30 MG 24 hr tablet Take 30 mg by mouth every morning.      . labetalol (NORMODYNE) 100 MG tablet Take 100 mg by mouth 2 (two) times daily.      Marland Kitchen latanoprost (XALATAN) 0.005 % ophthalmic solution Place 1 drop into both eyes at bedtime.      . methocarbamol (ROBAXIN) 500 MG tablet Take 250 mg by mouth 2 (two) times daily as needed (muscle spasms).      . metolazone (ZAROXOLYN) 2.5 MG tablet Take 2.5 mg by mouth every Monday, Wednesday, and Friday.      . nitroGLYCERIN (NITROSTAT) 0.4 MG SL tablet Place 0.4 mg under the tongue every 5 (five) minutes as needed for chest pain.       Marland Kitchen oxyCODONE (OXY IR/ROXICODONE) 5 MG immediate release tablet Take 5 mg by mouth every 8 (eight) hours as needed for pain.      Marland Kitchen oxyCODONE (OXYCONTIN) 10 MG 12 hr tablet Take 10 mg by mouth every 12 (twelve) hours.      . pantoprazole (PROTONIX) 40 MG tablet Take 40 mg by mouth 2 (two) times daily.      . potassium chloride SA (K-DUR,KLOR-CON) 20 MEQ tablet Take 40 mEq by mouth 2 (two) times daily.      . rosuvastatin (CRESTOR) 40 MG tablet Take 40 mg by mouth every morning.      . sertraline (ZOLOFT) 100 MG tablet Take 100 mg by mouth daily.      . sorbitol 70 % solution Take 15 mLs by mouth daily as needed (constipation).       . torsemide (DEMADEX) 20 MG tablet Take 40 mg by mouth 2 (two) times daily.      . traMADol (ULTRAM) 50 MG tablet Take 1 tablet (50 mg total) by mouth every 6 (six) hours as needed for pain.  15 tablet  0  . Vitamin D, Ergocalciferol, (DRISDOL) 50000 UNITS CAPS Take 50,000 Units by mouth every Monday, Wednesday, and Friday.      . VOLTAREN 1 % GEL Apply 2 g topically daily. Apply to knee      . zolpidem (AMBIEN)  5 MG tablet Take 2.5 mg by mouth at bedtime as needed for sleep.       No current facility-administered medications on file prior to visit.    Allergies  Allergen Reactions  . Iohexol      Code: RASH,  Desc: JENNIFER STATES ON PT'S CHART ALLERGIC TO IV DYE 09/04/07/RM, Onset Date: [id number]     Family History  Problem Relation Age of Onset  . Cancer Mother     "Stomach" Cancer  . Diabetes Mother   . Heart disease Mother   . Stomach cancer Mother   . Lymphoma Father   . Kidney disease Paternal Grandmother   . Asthma Other     BP 124/74  Pulse 80  SpO2 98%  Review of Systems Denies LOC.  Pt says she has lost weight, even aside from her amputation.       Objective:   Physical Exam VITAL SIGNS:  See vs page GENERAL: no distress     Assessment & Plan:  DM: Based on the pattern of her cbg's, she needs some adjustment in her therapy.  This insulin regimen was chosen from multiple options, for its simplicity and relatively low cost.  The benefits of glycemic control must be weighed against the risks of hypoglycemia.

## 2013-01-08 NOTE — Patient Instructions (Addendum)
Please come back for a regular physical appointment in 3 months.  blood tests are being requested for you today.  We'll contact you with results. Please take the insulin 20 units twice a day (with the first and last meals of the day), no matter what the blood sugar is. check your blood sugar twice a day.  vary the time of day when you check, between before the 3 meals, and at bedtime.  also check if you have symptoms of your blood sugar being too high or too low.  please keep a record of the readings and bring it to your next appointment here.  please call us sooner if your blood sugar goes below 70, or if you have a lot of readings over 200.

## 2013-01-11 ENCOUNTER — Other Ambulatory Visit: Payer: Self-pay | Admitting: Endocrinology

## 2013-01-11 LAB — LIPID PANEL
Cholesterol: 139 mg/dL (ref 0–200)
HDL: 53 mg/dL (ref >39.00)
Triglycerides: 173 mg/dL — ABNORMAL HIGH (ref 0.0–149.0)
VLDL: 34.6 mg/dL (ref 0.0–40.0)

## 2013-01-11 LAB — MICROALBUMIN / CREATININE URINE RATIO
Creatinine,U: 46.2 mg/dL
Microalb, Ur: 3.2 mg/dL — ABNORMAL HIGH (ref 0.0–1.9)

## 2013-01-11 LAB — HEPATIC FUNCTION PANEL
ALT: 18 U/L (ref 0–35)
Bilirubin, Direct: 0.1 mg/dL (ref 0.0–0.3)
Total Protein: 7.4 g/dL (ref 6.0–8.3)

## 2013-01-11 LAB — URINALYSIS, ROUTINE W REFLEX MICROSCOPIC
Bilirubin Urine: NEGATIVE
Hgb urine dipstick: NEGATIVE
Nitrite: NEGATIVE
Total Protein, Urine: NEGATIVE

## 2013-01-11 LAB — PTH, INTACT AND CALCIUM
Calcium: 9.6 mg/dL (ref 8.4–10.5)
PTH: 185.6 pg/mL — ABNORMAL HIGH (ref 14.0–72.0)

## 2013-01-11 LAB — BASIC METABOLIC PANEL
BUN: 68 mg/dL — ABNORMAL HIGH (ref 6–23)
Calcium: 9.6 mg/dL (ref 8.4–10.5)
Creatinine, Ser: 2.3 mg/dL — ABNORMAL HIGH (ref 0.4–1.2)
GFR: 26.28 mL/min — ABNORMAL LOW (ref >60.00)
Glucose, Bld: 391 mg/dL — ABNORMAL HIGH (ref 70–99)
Potassium: 3.5 mEq/L (ref 3.5–5.1)

## 2013-01-11 MED ORDER — ALLOPURINOL 100 MG PO TABS: 100.0000 mg | ORAL_TABLET | Freq: Every day | ORAL | Status: DC

## 2013-01-15 ENCOUNTER — Telehealth: Payer: Self-pay | Admitting: *Deleted

## 2013-01-15 NOTE — Telephone Encounter (Signed)
Can increase 70/30 insulin to 22 units twice a day (per Dr. George Hugh last note, she had hypoglycemia with 25 units twice a day).

## 2013-01-15 NOTE — Telephone Encounter (Signed)
[name] called and stated that Dr Everardo All had changed pt's insulin and her bg readings were fluctuating. This morning her bg was 215 and 230 yesterday evening. Please advise what pt needs to do.

## 2013-01-29 ENCOUNTER — Telehealth: Payer: Self-pay | Admitting: Family Medicine

## 2013-01-29 ENCOUNTER — Telehealth: Payer: Self-pay

## 2013-01-29 NOTE — Telephone Encounter (Signed)
Frederik Pear called to get verbal order to extend pt's PT after pt had a fall earlier this week and has not yet been fitted for leg prosthesis.  Verbal order given and hard copy to be sent to Dr Everardo All.

## 2013-01-29 NOTE — Telephone Encounter (Signed)
[name] with [name] needs an order to extend home health and PT for 2 more weeks,  2 times weekly [phone]

## 2013-02-01 ENCOUNTER — Other Ambulatory Visit: Payer: Self-pay

## 2013-02-01 MED ORDER — TORSEMIDE 20 MG PO TABS: 40.0000 mg | ORAL_TABLET | Freq: Two times a day (BID) | ORAL | Status: DC

## 2013-02-02 ENCOUNTER — Encounter: Payer: Self-pay | Admitting: Nephrology

## 2013-02-05 ENCOUNTER — Encounter: Payer: Self-pay | Admitting: Endocrinology

## 2013-02-05 ENCOUNTER — Ambulatory Visit (INDEPENDENT_AMBULATORY_CARE_PROVIDER_SITE_OTHER): Payer: Medicare Other | Admitting: Endocrinology

## 2013-02-05 ENCOUNTER — Other Ambulatory Visit: Payer: Self-pay | Admitting: Endocrinology

## 2013-02-05 VITALS — BP 134/80 | HR 78

## 2013-02-05 DIAGNOSIS — N058 Unspecified nephritic syndrome with other morphologic changes: Secondary | ICD-10-CM

## 2013-02-05 DIAGNOSIS — E1129 Type 2 diabetes mellitus with other diabetic kidney complication: Secondary | ICD-10-CM

## 2013-02-05 MED ORDER — HYDROCODONE-ACETAMINOPHEN 10-325 MG PO TABS: 1.0000 | ORAL_TABLET | Freq: Four times a day (QID) | ORAL | Status: DC | PRN

## 2013-02-05 NOTE — Patient Instructions (Addendum)
Please come back for a regular physical appointment in 2 months.  Please take the insulin 25 units with breakfast, and 20 units with the evening meal.   check your blood sugar twice a day.  vary the time of day when you check, between before the 3 meals, and at bedtime.  also check if you have symptoms of your blood sugar being too high or too low.  please keep a record of the readings and bring it to your next appointment here.  please call us sooner if your blood sugar goes below 70, or if you have a lot of readings over 200.    Please check these medication against what you have at home, and call if there is any difference.   Here is a prescription for pain, to take against your headache.

## 2013-02-05 NOTE — Progress Notes (Signed)
Subjective:    Patient ID: [name], female    DOB: November 21, 1929, 79 y.o.   MRN: [id number]  HPI Pt returns for f/u of insulin-requiring DM (dx'ed 1985; she has moderate neuropathy of the lower extremities; she has associated nephropathy, CAD, and PAD; therapy has limited by pt's request for least expensive insulin, and by her need for a simple regimen; she was seen in ER in early 2014, with severe hypoglycemia).  no cbg record, but states cbg's from 140-200's.  It is in general higher as the day goes on.  She has headache. Past Medical History  Diagnosis Date  . Uterine cancer   . Diabetes mellitus     type II; peripheral neuropathy  . Hypertension   . GERD (gastroesophageal reflux disease)   . Peripheral vascular disease     s/p L BKA 08/2012  . Obesity   . Dyslipidemia   . Diastolic CHF, chronic     EF 50-55%, mild LVH and grade 1 diast. Dysfxn  . Osteoarthritis cervical spine   . DM retinopathy   . Sleep apnea     with CPAP  . History of shingles   . COPD (chronic obstructive pulmonary disease)   . Depression   . Peptic ulcer disease     duodenal  . Peptic stricture of esophagus   . Hiatal hernia   . Diverticulosis   . Arthritis   . ASCVD (arteriosclerotic cardiovascular disease)     on MRI brin  . CAD (coronary artery disease)     a. s/p multiple caths with nonobs CAD;   b. cath 1/10: pLAD 20%, mLAD 40%, pCFX 20%, mCFX 40%, pRCA 60-70%;   c.  Myoview 06/02/12: Low anterior wall scar, no ischemia, EF 37%  . Cardiomyopathy     a. Echo 05/31/12: Severe LVH, EF 40-45%, diffuse HK, grade 1 diastolic dysfunction, mild to moderate calcified aortic valve annulus, mild to moderate aortic stenosis, MAC, mild MR, moderate LAE  . Asthma     Mild  . Pruritic condition     Idiopathic  . Hyperlipidemia   . Zoster   . Noncompliance   . CKD (chronic kidney disease)     Dr. Eliott Nine    Past Surgical History  Procedure Laterality Date  . Tubal ligation  1967  . Knee  arthroscopy  10/1998    Left  . Craniotomy  1997    Left for SDH  . Cataract extraction, bilateral  2005  . Hernia repair    . Esophagogastroduodenoscopy  04/04/2004  . Spine surgery      C-spine and lumbar surgery  . Cholecystectomy  2010  . Cardiac catheterization    . Dexa  7/05  . Abdominal hysterectomy    . Amputation Left 09/04/2012    Procedure: AMPUTATION BELOW KNEE;  Surgeon: Chuck Hint, MD;  Location: [facility] OR;  Service: Vascular;  Laterality: Left;    History   Social History  . Marital Status: Married    Spouse Name: N/A    Number of Children: 7  . Years of Education: N/A   Occupational History  . Retired    Social History Main Topics  . Smoking status: Never Smoker   . Smokeless tobacco: Never Used  . Alcohol Use: No     Comment: rare  . Drug Use: No  . Sexual Activity: No   Other Topics Concern  . Not on file   Social History Narrative  . No narrative on file  Current Outpatient Prescriptions on File Prior to Visit  Medication Sig Dispense Refill  . acetaminophen (TYLENOL) 500 MG tablet Take 500 mg by mouth every 6 (six) hours as needed for pain. For pain      . allopurinol (ZYLOPRIM) 100 MG tablet Take 1 tablet (100 mg total) by mouth daily.  30 tablet  6  . aspirin EC 81 MG tablet Take 81 mg by mouth every morning.      . docusate sodium (COLACE) 100 MG capsule Take 100 mg by mouth 2 (two) times daily as needed for constipation.       Marland Kitchen ezetimibe (ZETIA) 10 MG tablet Take 10 mg by mouth every morning.      . feeding supplement (GLUCERNA SHAKE) LIQD Take 237 mLs by mouth 2 (two) times daily.      Marland Kitchen gabapentin (NEURONTIN) 300 MG capsule Take 300 mg by mouth at bedtime.      . hydrALAZINE (APRESOLINE) 25 MG tablet Take 25 mg by mouth 2 (two) times daily.       . insulin NPH-regular (NOVOLIN 70/30) (70-30) 100 UNIT/ML injection 25 units with breakfast, and 20 units with the evening meal.      . isosorbide mononitrate (IMDUR) 30 MG 24 hr  tablet Take 30 mg by mouth every morning.      . labetalol (NORMODYNE) 100 MG tablet Take 100 mg by mouth 2 (two) times daily.      Marland Kitchen latanoprost (XALATAN) 0.005 % ophthalmic solution Place 1 drop into both eyes at bedtime.      . methocarbamol (ROBAXIN) 500 MG tablet Take 250 mg by mouth 2 (two) times daily as needed (muscle spasms).      . metolazone (ZAROXOLYN) 2.5 MG tablet Take 2.5 mg by mouth every Monday, Wednesday, and Friday.      . nitroGLYCERIN (NITROSTAT) 0.4 MG SL tablet Place 0.4 mg under the tongue every 5 (five) minutes as needed for chest pain.       . pantoprazole (PROTONIX) 40 MG tablet Take 40 mg by mouth 2 (two) times daily.      . potassium chloride SA (K-DUR,KLOR-CON) 20 MEQ tablet Take 40 mEq by mouth 2 (two) times daily.      . rosuvastatin (CRESTOR) 40 MG tablet Take 40 mg by mouth every morning.      . sertraline (ZOLOFT) 100 MG tablet Take 100 mg by mouth daily.      . sorbitol 70 % solution Take 15 mLs by mouth daily as needed (constipation).       . torsemide (DEMADEX) 20 MG tablet Take 2 tablets (40 mg total) by mouth 2 (two) times daily.  40 tablet  3  . traMADol (ULTRAM) 50 MG tablet Take 1 tablet (50 mg total) by mouth every 6 (six) hours as needed for pain.  15 tablet  0  . Vitamin D, Ergocalciferol, (DRISDOL) 50000 UNITS CAPS Take 50,000 Units by mouth every Monday, Wednesday, and Friday.      . VOLTAREN 1 % GEL Apply 2 g topically daily. Apply to knee      . zolpidem (AMBIEN) 5 MG tablet Take 2.5 mg by mouth at bedtime as needed for sleep.       No current facility-administered medications on file prior to visit.    Allergies  Allergen Reactions  . Iohexol      Code: RASH, Desc: JENNIFER STATES ON PT'S CHART ALLERGIC TO IV DYE 09/04/07/RM, Onset Date: [id number]     Family  History  Problem Relation Age of Onset  . Cancer Mother     "Stomach" Cancer  . Diabetes Mother   . Heart disease Mother   . Stomach cancer Mother   . Lymphoma Father   . Kidney  disease Paternal Grandmother   . Asthma Other     BP 134/80  Pulse 78  SpO2 98%  Review of Systems denies hypoglycemia and weight change    Objective:   Physical Exam VITAL SIGNS:  See vs page GENERAL: no distress LUNGS:  Clear to auscultation Ext: no edema     Assessment & Plan:  Headache, new, prob due to vasodilators.  We'll rx symptomatically. DM: Based on the pattern of her cbg's, she needs some adjustment in her therapy.  This insulin regimen was chosen from multiple options, for its simplicity.  The benefits of glycemic control must be weighed against the risks of hypoglycemia.

## 2013-02-07 NOTE — Progress Notes (Signed)
This encounter was created in error - please disregard.

## 2013-02-08 ENCOUNTER — Other Ambulatory Visit: Payer: Self-pay | Admitting: *Deleted

## 2013-02-08 MED ORDER — GLUCOSE BLOOD VI STRP: ORAL_STRIP | Status: DC

## 2013-02-12 ENCOUNTER — Telehealth: Payer: Self-pay | Admitting: Endocrinology

## 2013-02-12 NOTE — Telephone Encounter (Signed)
Pt advised will send rx in for true test strips

## 2013-02-16 ENCOUNTER — Telehealth: Payer: Self-pay | Admitting: Endocrinology

## 2013-02-16 NOTE — Telephone Encounter (Signed)
Ok to monitor medication compliance.

## 2013-02-16 NOTE — Telephone Encounter (Signed)
[place] with [facility] advised

## 2013-02-17 ENCOUNTER — Telehealth: Payer: Self-pay | Admitting: Endocrinology

## 2013-02-17 NOTE — Telephone Encounter (Signed)
See message yesterday

## 2013-02-17 NOTE — Telephone Encounter (Signed)
[name] advised.

## 2013-02-17 NOTE — Telephone Encounter (Signed)
[facility] called, [name]. Needs order for home health. [phone]# [phone] / Sherri S.

## 2013-02-18 ENCOUNTER — Telehealth: Payer: Self-pay | Admitting: Endocrinology

## 2013-02-18 NOTE — Telephone Encounter (Signed)
Pt's daughter called, Ms. Ronn Melena [phone]) requesting an rx for pt.She believes she has a UTI. Please advise.

## 2013-02-18 NOTE — Telephone Encounter (Signed)
Ov 02/19/13

## 2013-02-19 ENCOUNTER — Other Ambulatory Visit: Payer: Self-pay | Admitting: Endocrinology

## 2013-02-19 ENCOUNTER — Encounter: Payer: Self-pay | Admitting: Endocrinology

## 2013-02-19 ENCOUNTER — Ambulatory Visit (INDEPENDENT_AMBULATORY_CARE_PROVIDER_SITE_OTHER): Payer: Medicare Other | Admitting: Endocrinology

## 2013-02-19 ENCOUNTER — Telehealth: Payer: Self-pay | Admitting: Endocrinology

## 2013-02-19 VITALS — BP 128/80 | HR 78

## 2013-02-19 DIAGNOSIS — N390 Urinary tract infection, site not specified: Secondary | ICD-10-CM

## 2013-02-19 LAB — URINALYSIS, ROUTINE W REFLEX MICROSCOPIC
Bilirubin Urine: NEGATIVE
Urine Glucose: NEGATIVE

## 2013-02-19 MED ORDER — CIPROFLOXACIN HCL 250 MG PO TABS: 250.0000 mg | ORAL_TABLET | Freq: Two times a day (BID) | ORAL | Status: DC

## 2013-02-19 NOTE — Telephone Encounter (Signed)
No need, pt was seen here today

## 2013-02-19 NOTE — Telephone Encounter (Signed)
Pt coming in today

## 2013-02-19 NOTE — Patient Instructions (Addendum)
A urine test is requested for you today.  We'll contact you with results.

## 2013-02-19 NOTE — Progress Notes (Signed)
Subjective:    Patient ID: [name], female    DOB: 1930/01/11, 79 y.o.   MRN: [id number]  HPI Pt states a few days of moderate pain at the urethra, in the context of urination.  Pt says she was rx'ed for uti 2 mos ago, by nephrology.   Past Medical History  Diagnosis Date  . Uterine cancer   . Diabetes mellitus     type II; peripheral neuropathy  . Hypertension   . GERD (gastroesophageal reflux disease)   . Peripheral vascular disease     s/p L BKA 08/2012  . Obesity   . Dyslipidemia   . Diastolic CHF, chronic     EF 50-55%, mild LVH and grade 1 diast. Dysfxn  . Osteoarthritis cervical spine   . DM retinopathy   . Sleep apnea     with CPAP  . History of shingles   . COPD (chronic obstructive pulmonary disease)   . Depression   . Peptic ulcer disease     duodenal  . Peptic stricture of esophagus   . Hiatal hernia   . Diverticulosis   . Arthritis   . ASCVD (arteriosclerotic cardiovascular disease)     on MRI brin  . CAD (coronary artery disease)     a. s/p multiple caths with nonobs CAD;   b. cath 1/10: pLAD 20%, mLAD 40%, pCFX 20%, mCFX 40%, pRCA 60-70%;   c.  Myoview 06/02/12: Low anterior wall scar, no ischemia, EF 37%  . Cardiomyopathy     a. Echo 05/31/12: Severe LVH, EF 40-45%, diffuse HK, grade 1 diastolic dysfunction, mild to moderate calcified aortic valve annulus, mild to moderate aortic stenosis, MAC, mild MR, moderate LAE  . Asthma     Mild  . Pruritic condition     Idiopathic  . Hyperlipidemia   . Zoster   . Noncompliance   . CKD (chronic kidney disease)     Dr. Eliott Nine    Past Surgical History  Procedure Laterality Date  . Tubal ligation  1967  . Knee arthroscopy  10/1998    Left  . Craniotomy  1997    Left for SDH  . Cataract extraction, bilateral  2005  . Hernia repair    . Esophagogastroduodenoscopy  04/04/2004  . Spine surgery      C-spine and lumbar surgery  . Cholecystectomy  2010  . Cardiac catheterization    . Dexa  7/05   . Abdominal hysterectomy    . Amputation Left 09/04/2012    Procedure: AMPUTATION BELOW KNEE;  Surgeon: Chuck Hint, MD;  Location: [facility] OR;  Service: Vascular;  Laterality: Left;    History   Social History  . Marital Status: Married    Spouse Name: N/A    Number of Children: 7  . Years of Education: N/A   Occupational History  . Retired    Social History Main Topics  . Smoking status: Never Smoker   . Smokeless tobacco: Never Used  . Alcohol Use: No     Comment: rare  . Drug Use: No  . Sexual Activity: No   Other Topics Concern  . Not on file   Social History Narrative  . No narrative on file    Current Outpatient Prescriptions on File Prior to Visit  Medication Sig Dispense Refill  . acetaminophen (TYLENOL) 500 MG tablet Take 500 mg by mouth every 6 (six) hours as needed for pain. For pain      . allopurinol (ZYLOPRIM) 100  MG tablet Take 1 tablet (100 mg total) by mouth daily.  30 tablet  6  . aspirin EC 81 MG tablet Take 81 mg by mouth every morning.      . docusate sodium (COLACE) 100 MG capsule Take 100 mg by mouth 2 (two) times daily as needed for constipation.       Marland Kitchen ezetimibe (ZETIA) 10 MG tablet Take 10 mg by mouth every morning.      . feeding supplement (GLUCERNA SHAKE) LIQD Take 237 mLs by mouth 2 (two) times daily.      Marland Kitchen gabapentin (NEURONTIN) 300 MG capsule Take 300 mg by mouth at bedtime.      Marland Kitchen glucose blood (TRUETEST TEST) test strip Use as instructed  100 each  12  . hydrALAZINE (APRESOLINE) 25 MG tablet Take 25 mg by mouth 2 (two) times daily.       Marland Kitchen HYDROcodone-acetaminophen (NORCO) 10-325 MG per tablet Take 1 tablet by mouth every 6 (six) hours as needed for pain.  30 tablet  0  . insulin NPH-regular (NOVOLIN 70/30) (70-30) 100 UNIT/ML injection 25 units with breakfast, and 20 units with the evening meal.      . isosorbide mononitrate (IMDUR) 30 MG 24 hr tablet TAKE 1 TABLET BY MOUTH EVERY DAY  30 tablet  0  . labetalol (NORMODYNE) 100 MG  tablet Take 100 mg by mouth 2 (two) times daily.      Marland Kitchen latanoprost (XALATAN) 0.005 % ophthalmic solution Place 1 drop into both eyes at bedtime.      . methocarbamol (ROBAXIN) 500 MG tablet Take 250 mg by mouth 2 (two) times daily as needed (muscle spasms).      . metolazone (ZAROXOLYN) 2.5 MG tablet Take 2.5 mg by mouth every Monday, Wednesday, and Friday.      . nitroGLYCERIN (NITROSTAT) 0.4 MG SL tablet Place 0.4 mg under the tongue every 5 (five) minutes as needed for chest pain.       . pantoprazole (PROTONIX) 40 MG tablet Take 40 mg by mouth 2 (two) times daily.      . potassium chloride SA (K-DUR,KLOR-CON) 20 MEQ tablet Take 40 mEq by mouth 2 (two) times daily.      . rosuvastatin (CRESTOR) 40 MG tablet Take 40 mg by mouth every morning.      . sertraline (ZOLOFT) 100 MG tablet Take 100 mg by mouth daily.      . sorbitol 70 % solution Take 15 mLs by mouth daily as needed (constipation).       . torsemide (DEMADEX) 20 MG tablet Take 2 tablets (40 mg total) by mouth 2 (two) times daily.  40 tablet  3  . traMADol (ULTRAM) 50 MG tablet Take 1 tablet (50 mg total) by mouth every 6 (six) hours as needed for pain.  15 tablet  0  . Vitamin D, Ergocalciferol, (DRISDOL) 50000 UNITS CAPS Take 50,000 Units by mouth every Monday, Wednesday, and Friday.      . VOLTAREN 1 % GEL Apply 2 g topically daily. Apply to knee      . zolpidem (AMBIEN) 5 MG tablet Take 2.5 mg by mouth at bedtime as needed for sleep.       No current facility-administered medications on file prior to visit.    Allergies  Allergen Reactions  . Iohexol      Code: RASH, Desc: JENNIFER STATES ON PT'S CHART ALLERGIC TO IV DYE 09/04/07/RM, Onset Date: [id number]     Family History  Problem Relation Age of Onset  . Cancer Mother     "Stomach" Cancer  . Diabetes Mother   . Heart disease Mother   . Stomach cancer Mother   . Lymphoma Father   . Kidney disease Paternal Grandmother   . Asthma Other     BP 128/80  Pulse 78   SpO2 98%  Review of Systems Denies fever    Objective:   Physical Exam VITAL SIGNS:  See vs page GENERAL: no distress.  In wheelchair.   Abd: slight suprapubic tenderness.     (i reviewed ua result)    Assessment & Plan:  UTI, recurrent

## 2013-02-19 NOTE — Telephone Encounter (Signed)
Dianne advised

## 2013-02-19 NOTE — Telephone Encounter (Signed)
[place], nurse with [facility] [phone] left voicemaik stating pt delcined visit on 02/16/13 for woundcare, would like to know if she can get order to visit the patient today, please adivse

## 2013-02-19 NOTE — Telephone Encounter (Signed)
Daughter calling, mom's urine still burns. Wants appt. 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

## 2013-02-23 DIAGNOSIS — I509 Heart failure, unspecified: Secondary | ICD-10-CM

## 2013-02-23 DIAGNOSIS — E1149 Type 2 diabetes mellitus with other diabetic neurological complication: Secondary | ICD-10-CM

## 2013-02-23 DIAGNOSIS — Z48812 Encounter for surgical aftercare following surgery on the circulatory system: Secondary | ICD-10-CM

## 2013-02-23 DIAGNOSIS — R269 Unspecified abnormalities of gait and mobility: Secondary | ICD-10-CM

## 2013-02-23 DIAGNOSIS — E1142 Type 2 diabetes mellitus with diabetic polyneuropathy: Secondary | ICD-10-CM

## 2013-02-26 ENCOUNTER — Other Ambulatory Visit: Payer: Self-pay | Admitting: Endocrinology

## 2013-03-02 DIAGNOSIS — G4733 Obstructive sleep apnea (adult) (pediatric): Secondary | ICD-10-CM

## 2013-03-05 ENCOUNTER — Other Ambulatory Visit: Payer: Self-pay | Admitting: Endocrinology

## 2013-03-26 ENCOUNTER — Other Ambulatory Visit: Payer: Self-pay | Admitting: Endocrinology

## 2013-03-30 DIAGNOSIS — S88119A Complete traumatic amputation at level between knee and ankle, unspecified lower leg, initial encounter: Secondary | ICD-10-CM

## 2013-03-30 DIAGNOSIS — R269 Unspecified abnormalities of gait and mobility: Secondary | ICD-10-CM

## 2013-03-30 DIAGNOSIS — E1142 Type 2 diabetes mellitus with diabetic polyneuropathy: Secondary | ICD-10-CM

## 2013-03-30 DIAGNOSIS — E1149 Type 2 diabetes mellitus with other diabetic neurological complication: Secondary | ICD-10-CM

## 2013-03-30 DIAGNOSIS — I509 Heart failure, unspecified: Secondary | ICD-10-CM

## 2013-04-06 ENCOUNTER — Other Ambulatory Visit: Payer: Self-pay | Admitting: Endocrinology

## 2013-04-07 ENCOUNTER — Other Ambulatory Visit: Payer: Self-pay | Admitting: Endocrinology

## 2013-04-09 ENCOUNTER — Encounter: Payer: Self-pay | Admitting: Endocrinology

## 2013-04-09 ENCOUNTER — Ambulatory Visit (INDEPENDENT_AMBULATORY_CARE_PROVIDER_SITE_OTHER): Payer: Medicare Other | Admitting: Endocrinology

## 2013-04-09 VITALS — BP 130/80 | HR 66

## 2013-04-09 DIAGNOSIS — N259 Disorder resulting from impaired renal tubular function, unspecified: Secondary | ICD-10-CM

## 2013-04-09 DIAGNOSIS — N058 Unspecified nephritic syndrome with other morphologic changes: Secondary | ICD-10-CM

## 2013-04-09 DIAGNOSIS — E1129 Type 2 diabetes mellitus with other diabetic kidney complication: Secondary | ICD-10-CM

## 2013-04-09 LAB — BASIC METABOLIC PANEL
Calcium: 9.6 mg/dL (ref 8.4–10.5)
Creatinine, Ser: 1.2 mg/dL (ref 0.4–1.2)
GFR: 56.45 mL/min — ABNORMAL LOW (ref >60.00)
Glucose, Bld: 110 mg/dL — ABNORMAL HIGH (ref 70–99)
Potassium: 3.9 mEq/L (ref 3.5–5.1)
Sodium: 139 mEq/L (ref 135–145)

## 2013-04-09 LAB — HEMOGLOBIN A1C: Hgb A1c MFr Bld: 8.6 % — ABNORMAL HIGH (ref 4.6–6.5)

## 2013-04-09 LAB — URIC ACID: Uric Acid, Serum: 5.1 mg/dL (ref 2.4–7.0)

## 2013-04-09 MED ORDER — SERTRALINE HCL 100 MG PO TABS: 100.0000 mg | ORAL_TABLET | Freq: Every day | ORAL | Status: DC

## 2013-04-09 MED ORDER — INSULIN NPH ISOPHANE & REGULAR (70-30) 100 UNIT/ML ~~LOC~~ SUSP: SUBCUTANEOUS | Status: DC

## 2013-04-09 MED ORDER — ROSUVASTATIN CALCIUM 40 MG PO TABS: 40.0000 mg | ORAL_TABLET | Freq: Every day | ORAL | Status: DC

## 2013-04-09 MED ORDER — ALLOPURINOL 100 MG PO TABS: 100.0000 mg | ORAL_TABLET | Freq: Every day | ORAL | Status: DC

## 2013-04-09 MED ORDER — HYDROCODONE-ACETAMINOPHEN 10-325 MG PO TABS: 1.0000 | ORAL_TABLET | Freq: Four times a day (QID) | ORAL | Status: DC | PRN

## 2013-04-09 MED ORDER — HYDRALAZINE HCL 25 MG PO TABS: 25.0000 mg | ORAL_TABLET | Freq: Three times a day (TID) | ORAL | Status: DC

## 2013-04-09 MED ORDER — PANTOPRAZOLE SODIUM 40 MG PO TBEC: 40.0000 mg | DELAYED_RELEASE_TABLET | Freq: Two times a day (BID) | ORAL | Status: DC

## 2013-04-09 MED ORDER — EZETIMIBE 10 MG PO TABS: 10.0000 mg | ORAL_TABLET | Freq: Every morning | ORAL | Status: DC

## 2013-04-09 MED ORDER — GABAPENTIN 300 MG PO CAPS: 300.0000 mg | ORAL_CAPSULE | Freq: Every day | ORAL | Status: DC

## 2013-04-09 MED ORDER — METOLAZONE 2.5 MG PO TABS: 2.5000 mg | ORAL_TABLET | ORAL | Status: DC

## 2013-04-09 MED ORDER — LABETALOL HCL 100 MG PO TABS: 100.0000 mg | ORAL_TABLET | Freq: Two times a day (BID) | ORAL | Status: DC

## 2013-04-09 MED ORDER — ISOSORBIDE MONONITRATE ER 30 MG PO TB24: 30.0000 mg | ORAL_TABLET | Freq: Every day | ORAL | Status: DC

## 2013-04-09 MED ORDER — POTASSIUM CHLORIDE CRYS ER 20 MEQ PO TBCR: 40.0000 meq | EXTENDED_RELEASE_TABLET | Freq: Two times a day (BID) | ORAL | Status: DC

## 2013-04-09 NOTE — Progress Notes (Signed)
Subjective:    Patient ID: [name], female    DOB: July 22, 1929, 79 y.o.   MRN: [id number]  HPI Pt returns for f/u of insulin-requiring DM (dx'ed 1985, on a routine blood test; she has moderate neuropathy of the lower extremities; she has associated nephropathy, CAD, and PAD; therapy has limited by pt's request for least expensive insulin, and by her need for a simple regimen; she was seen in ER in early 2014, with severe hypoglycemia; she has never had DKA).  She takes 23 units qam and 25 unit qpm.  She had 1 episode of hypoglycemia, and this was mild, at 3 AM.  no cbg record, but states cbg's are in the 200's at lunch and the afternoon. Past Medical History  Diagnosis Date  . Uterine cancer   . Diabetes mellitus     type II; peripheral neuropathy  . Hypertension   . GERD (gastroesophageal reflux disease)   . Peripheral vascular disease     s/p L BKA 08/2012  . Obesity   . Dyslipidemia   . Diastolic CHF, chronic     EF 50-55%, mild LVH and grade 1 diast. Dysfxn  . Osteoarthritis cervical spine   . DM retinopathy   . Sleep apnea     with CPAP  . History of shingles   . COPD (chronic obstructive pulmonary disease)   . Depression   . Peptic ulcer disease     duodenal  . Peptic stricture of esophagus   . Hiatal hernia   . Diverticulosis   . Arthritis   . ASCVD (arteriosclerotic cardiovascular disease)     on MRI brin  . CAD (coronary artery disease)     a. s/p multiple caths with nonobs CAD;   b. cath 1/10: pLAD 20%, mLAD 40%, pCFX 20%, mCFX 40%, pRCA 60-70%;   c.  Myoview 06/02/12: Low anterior wall scar, no ischemia, EF 37%  . Cardiomyopathy     a. Echo 05/31/12: Severe LVH, EF 40-45%, diffuse HK, grade 1 diastolic dysfunction, mild to moderate calcified aortic valve annulus, mild to moderate aortic stenosis, MAC, mild MR, moderate LAE  . Asthma     Mild  . Pruritic condition     Idiopathic  . Hyperlipidemia   . Zoster   . Noncompliance   . CKD (chronic kidney  disease)     Dr. Eliott Nine    Past Surgical History  Procedure Laterality Date  . Tubal ligation  1967  . Knee arthroscopy  10/1998    Left  . Craniotomy  1997    Left for SDH  . Cataract extraction, bilateral  2005  . Hernia repair    . Esophagogastroduodenoscopy  04/04/2004  . Spine surgery      C-spine and lumbar surgery  . Cholecystectomy  2010  . Cardiac catheterization    . Dexa  7/05  . Abdominal hysterectomy    . Amputation Left 09/04/2012    Procedure: AMPUTATION BELOW KNEE;  Surgeon: Chuck Hint, MD;  Location: [facility] OR;  Service: Vascular;  Laterality: Left;    History   Social History  . Marital Status: Married    Spouse Name: N/A    Number of Children: 7  . Years of Education: N/A   Occupational History  . Retired    Social History Main Topics  . Smoking status: Never Smoker   . Smokeless tobacco: Never Used  . Alcohol Use: No     Comment: rare  . Drug Use: No  .  Sexual Activity: No   Other Topics Concern  . Not on file   Social History Narrative  . No narrative on file    Current Outpatient Prescriptions on File Prior to Visit  Medication Sig Dispense Refill  . acetaminophen (TYLENOL) 500 MG tablet Take 500 mg by mouth every 6 (six) hours as needed for pain. For pain      . aspirin EC 81 MG tablet Take 81 mg by mouth every morning.      . docusate sodium (COLACE) 100 MG capsule Take 100 mg by mouth 2 (two) times daily as needed for constipation.       . feeding supplement (GLUCERNA SHAKE) LIQD Take 237 mLs by mouth 2 (two) times daily.      Marland Kitchen glucose blood (TRUETEST TEST) test strip Use as instructed  100 each  12  . latanoprost (XALATAN) 0.005 % ophthalmic solution Place 1 drop into both eyes at bedtime.      . methocarbamol (ROBAXIN) 500 MG tablet Take 250 mg by mouth 2 (two) times daily as needed (muscle spasms).      . nitroGLYCERIN (NITROSTAT) 0.4 MG SL tablet Place 0.4 mg under the tongue every 5 (five) minutes as needed for chest  pain.       . sorbitol 70 % solution Take 15 mLs by mouth daily as needed (constipation).       . torsemide (DEMADEX) 20 MG tablet Take 2 tablets (40 mg total) by mouth 2 (two) times daily.  40 tablet  3  . traMADol (ULTRAM) 50 MG tablet Take 1 tablet (50 mg total) by mouth every 6 (six) hours as needed for pain.  15 tablet  0  . Vitamin D, Ergocalciferol, (DRISDOL) 50000 UNITS CAPS Take 50,000 Units by mouth every Monday, Wednesday, and Friday.      . VOLTAREN 1 % GEL Apply 2 g topically daily. Apply to knee      . ZETIA 10 MG tablet TAKE 1 TABLET BY MOUTH EVERY DAY  30 tablet  0  . zolpidem (AMBIEN) 5 MG tablet Take 2.5 mg by mouth at bedtime as needed for sleep.       No current facility-administered medications on file prior to visit.    Allergies  Allergen Reactions  . Iohexol      Code: RASH, Desc: JENNIFER STATES ON PT'S CHART ALLERGIC TO IV DYE 09/04/07/RM, Onset Date: [id number]     Family History  Problem Relation Age of Onset  . Cancer Mother     "Stomach" Cancer  . Diabetes Mother   . Heart disease Mother   . Stomach cancer Mother   . Lymphoma Father   . Kidney disease Paternal Grandmother   . Asthma Other     BP 130/80  Pulse 66  SpO2 94%  Review of Systems Denies LOC and weight change    Objective:   Physical Exam VITAL SIGNS:  See vs page GENERAL: no distress.  In wheelchair  Lab Results  Component Value Date   HGBA1C 8.6* 04/09/2013      Assessment & Plan:  DM: Based on the pattern of her cbg's, she needs some adjustment in her therapy.  This insulin regimen was chosen from multiple options, for its simplicity.  The benefits of glycemic control must be weighed against the risks of hypoglycemia.   Renal insufficiency: this increases the risk of hypoglycemia.

## 2013-04-09 NOTE — Patient Instructions (Addendum)
Please come back for a regular physical appointment in 3 months  Please take the insulin 30 units with breakfast, and 20 units with the evening meal. check your blood sugar twice a day.  vary the time of day when you check, between before the 3 meals, and at bedtime.  also check if you have symptoms of your blood sugar being too high or too low.  please keep a record of the readings and bring it to your next appointment here.  please call us sooner if your blood sugar goes below 70, or if you have a lot of readings over 200.

## 2013-04-12 NOTE — Progress Notes (Signed)
Quick Note:  Called and spoke with pt and pt is aware. ______ 

## 2013-04-13 ENCOUNTER — Other Ambulatory Visit: Payer: Self-pay | Admitting: Endocrinology

## 2013-04-13 ENCOUNTER — Ambulatory Visit: Payer: Medicare Other | Attending: Vascular Surgery | Admitting: Physical Therapy

## 2013-04-13 DIAGNOSIS — Z4789 Encounter for other orthopedic aftercare: Secondary | ICD-10-CM | POA: Insufficient documentation

## 2013-04-13 DIAGNOSIS — M6281 Muscle weakness (generalized): Secondary | ICD-10-CM | POA: Insufficient documentation

## 2013-04-13 DIAGNOSIS — R269 Unspecified abnormalities of gait and mobility: Secondary | ICD-10-CM | POA: Insufficient documentation

## 2013-04-13 DIAGNOSIS — S88119A Complete traumatic amputation at level between knee and ankle, unspecified lower leg, initial encounter: Secondary | ICD-10-CM | POA: Insufficient documentation

## 2013-04-13 DIAGNOSIS — R5381 Other malaise: Secondary | ICD-10-CM | POA: Insufficient documentation

## 2013-04-15 ENCOUNTER — Encounter: Payer: Medicare Other | Admitting: Physical Therapy

## 2013-04-20 ENCOUNTER — Ambulatory Visit: Payer: Medicare Other | Admitting: Physical Therapy

## 2013-04-20 ENCOUNTER — Encounter: Payer: Medicare Other | Admitting: Physical Therapy

## 2013-04-21 ENCOUNTER — Ambulatory Visit: Payer: Medicare Other | Attending: Vascular Surgery | Admitting: Physical Therapy

## 2013-04-21 DIAGNOSIS — Z4789 Encounter for other orthopedic aftercare: Secondary | ICD-10-CM | POA: Insufficient documentation

## 2013-04-21 DIAGNOSIS — R5381 Other malaise: Secondary | ICD-10-CM | POA: Insufficient documentation

## 2013-04-21 DIAGNOSIS — R269 Unspecified abnormalities of gait and mobility: Secondary | ICD-10-CM | POA: Insufficient documentation

## 2013-04-21 DIAGNOSIS — M6281 Muscle weakness (generalized): Secondary | ICD-10-CM | POA: Insufficient documentation

## 2013-04-21 DIAGNOSIS — S88119A Complete traumatic amputation at level between knee and ankle, unspecified lower leg, initial encounter: Secondary | ICD-10-CM | POA: Insufficient documentation

## 2013-04-22 ENCOUNTER — Encounter: Payer: Medicare Other | Admitting: Physical Therapy

## 2013-04-23 ENCOUNTER — Ambulatory Visit: Payer: Medicare Other | Admitting: Physical Therapy

## 2013-04-27 ENCOUNTER — Ambulatory Visit: Payer: Medicare Other | Admitting: Physical Therapy

## 2013-04-28 ENCOUNTER — Telehealth: Payer: Self-pay | Admitting: Endocrinology

## 2013-04-28 MED ORDER — ATORVASTATIN CALCIUM 80 MG PO TABS: 80.0000 mg | ORAL_TABLET | Freq: Every day | ORAL | Status: DC

## 2013-04-28 NOTE — Telephone Encounter (Signed)
i have sent a prescription to your pharmacy, to change the crestor to generic lipitor. Please continue the same zetia We'll recheck the cholesterol when you return next time.

## 2013-04-29 NOTE — Telephone Encounter (Signed)
Called pt's daughter and advised her that Dr Everardo All has sent a  prescription to your pharmacy, to change the crestor to generic lipitor. Please continue the same Zetia. We'll recheck the cholesterol when you return next time. She understood.

## 2013-04-30 ENCOUNTER — Ambulatory Visit: Payer: Medicare Other | Admitting: Physical Therapy

## 2013-05-03 ENCOUNTER — Other Ambulatory Visit: Payer: Self-pay | Admitting: Vascular Surgery

## 2013-05-03 DIAGNOSIS — I739 Peripheral vascular disease, unspecified: Secondary | ICD-10-CM

## 2013-05-03 DIAGNOSIS — Z48812 Encounter for surgical aftercare following surgery on the circulatory system: Secondary | ICD-10-CM

## 2013-05-04 ENCOUNTER — Ambulatory Visit: Payer: Medicare Other | Admitting: Physical Therapy

## 2013-05-06 ENCOUNTER — Ambulatory Visit: Payer: Medicare Other | Admitting: Physical Therapy

## 2013-05-10 ENCOUNTER — Other Ambulatory Visit: Payer: Self-pay | Admitting: Endocrinology

## 2013-05-10 ENCOUNTER — Ambulatory Visit: Payer: Medicare Other | Admitting: Physical Therapy

## 2013-05-11 ENCOUNTER — Encounter: Payer: Medicare Other | Admitting: Physical Therapy

## 2013-05-11 ENCOUNTER — Ambulatory Visit: Payer: Medicare Other | Admitting: Physical Therapy

## 2013-05-12 ENCOUNTER — Encounter: Payer: Medicare Other | Admitting: Physical Therapy

## 2013-05-18 ENCOUNTER — Ambulatory Visit: Payer: Medicare Other | Attending: Vascular Surgery | Admitting: Physical Therapy

## 2013-05-18 DIAGNOSIS — S88119A Complete traumatic amputation at level between knee and ankle, unspecified lower leg, initial encounter: Secondary | ICD-10-CM | POA: Insufficient documentation

## 2013-05-18 DIAGNOSIS — R5381 Other malaise: Secondary | ICD-10-CM | POA: Insufficient documentation

## 2013-05-18 DIAGNOSIS — M6281 Muscle weakness (generalized): Secondary | ICD-10-CM | POA: Insufficient documentation

## 2013-05-18 DIAGNOSIS — Z4789 Encounter for other orthopedic aftercare: Secondary | ICD-10-CM | POA: Insufficient documentation

## 2013-05-18 DIAGNOSIS — R269 Unspecified abnormalities of gait and mobility: Secondary | ICD-10-CM | POA: Insufficient documentation

## 2013-05-20 ENCOUNTER — Ambulatory Visit: Payer: Medicare Other | Admitting: Physical Therapy

## 2013-05-25 ENCOUNTER — Ambulatory Visit: Payer: Medicare Other | Admitting: Physical Therapy

## 2013-05-26 ENCOUNTER — Ambulatory Visit: Payer: Medicare Other | Admitting: Physical Therapy

## 2013-05-27 ENCOUNTER — Ambulatory Visit: Payer: Medicare Other | Admitting: Physical Therapy

## 2013-05-27 ENCOUNTER — Encounter: Payer: Medicare Other | Admitting: Physical Therapy

## 2013-06-01 ENCOUNTER — Ambulatory Visit: Payer: Medicare Other | Admitting: Physical Therapy

## 2013-06-03 ENCOUNTER — Ambulatory Visit: Payer: Medicare Other | Admitting: Physical Therapy

## 2013-06-07 ENCOUNTER — Ambulatory Visit: Payer: Medicare Other | Admitting: Physical Therapy

## 2013-06-08 ENCOUNTER — Ambulatory Visit: Payer: Medicare Other | Admitting: Physical Therapy

## 2013-06-15 ENCOUNTER — Ambulatory Visit: Payer: Medicare Other | Admitting: Physical Therapy

## 2013-06-18 ENCOUNTER — Ambulatory Visit: Payer: Medicare Other | Attending: Vascular Surgery | Admitting: Physical Therapy

## 2013-06-18 DIAGNOSIS — S88119A Complete traumatic amputation at level between knee and ankle, unspecified lower leg, initial encounter: Secondary | ICD-10-CM | POA: Insufficient documentation

## 2013-06-18 DIAGNOSIS — R5381 Other malaise: Secondary | ICD-10-CM | POA: Insufficient documentation

## 2013-06-18 DIAGNOSIS — M6281 Muscle weakness (generalized): Secondary | ICD-10-CM | POA: Insufficient documentation

## 2013-06-18 DIAGNOSIS — R269 Unspecified abnormalities of gait and mobility: Secondary | ICD-10-CM | POA: Insufficient documentation

## 2013-06-18 DIAGNOSIS — Z4789 Encounter for other orthopedic aftercare: Secondary | ICD-10-CM | POA: Insufficient documentation

## 2013-06-22 ENCOUNTER — Encounter: Payer: Self-pay | Admitting: Vascular Surgery

## 2013-06-22 ENCOUNTER — Encounter: Payer: Medicare Other | Admitting: Physical Therapy

## 2013-06-23 ENCOUNTER — Ambulatory Visit (HOSPITAL_COMMUNITY)
Admission: RE | Admit: 2013-06-23 | Discharge: 2013-06-23 | Disposition: A | Discharge status: Home / Self Care | Payer: Medicare Other | Source: Ambulatory Visit | Attending: Vascular Surgery | Admitting: Vascular Surgery

## 2013-06-23 ENCOUNTER — Encounter: Payer: Self-pay | Admitting: Vascular Surgery

## 2013-06-23 ENCOUNTER — Ambulatory Visit (INDEPENDENT_AMBULATORY_CARE_PROVIDER_SITE_OTHER): Payer: Medicare Other | Admitting: Vascular Surgery

## 2013-06-23 VITALS — BP 150/53 | HR 77 | Ht 66.0 in | Wt 200.0 lb

## 2013-06-23 DIAGNOSIS — I739 Peripheral vascular disease, unspecified: Secondary | ICD-10-CM

## 2013-06-23 DIAGNOSIS — Z48812 Encounter for surgical aftercare following surgery on the circulatory system: Secondary | ICD-10-CM | POA: Insufficient documentation

## 2013-06-23 NOTE — Addendum Note (Signed)
Addended by: Mena Goes on: 06/23/2013 05:48 PM   Modules accepted: Orders

## 2013-06-23 NOTE — Progress Notes (Signed)
[facility] [place]  Patient name: [name] MRN: [id number] DOB: May 07, 1930 Sex: female  REASON FOR VISIT: Follow up of peripheral vascular disease.  HPI: [name] is a 80 y.o. female who underwent a left below the knee amputation for ischemic left foot on 09/04/2012. Her arteriogram showed severe tibial artery occlusive disease with no options for revascularization. She comes in for follow visit to continue to follow her peripheral vascular disease on the right.  She did get a prosthesis for her left below the knee amputation but recently developed a small blister. Denies significant pain in her stump. She denies claudication or rest pain on the right side. She denies any nonhealing wounds of the right foot.  Past Medical History  Diagnosis Date  . Uterine cancer   . Diabetes mellitus     type II; peripheral neuropathy  . Hypertension   . GERD (gastroesophageal reflux disease)   . Peripheral vascular disease     s/p L BKA 08/2012  . Obesity   . Dyslipidemia   . Diastolic CHF, chronic     EF 50-55%, mild LVH and grade 1 diast. Dysfxn  . Osteoarthritis cervical spine   . DM retinopathy   . Sleep apnea     with CPAP  . History of shingles   . COPD (chronic obstructive pulmonary disease)   . Depression   . Peptic ulcer disease     duodenal  . Peptic stricture of esophagus   . Hiatal hernia   . Diverticulosis   . Arthritis   . ASCVD (arteriosclerotic cardiovascular disease)     on MRI brin  . CAD (coronary artery disease)     a. s/p multiple caths with nonobs CAD;   b. cath 1/10: pLAD 20%, mLAD 40%, pCFX 20%, mCFX 40%, pRCA 60-70%;   c.  Myoview 06/02/12: Low anterior wall scar, no ischemia, EF 37%  . Cardiomyopathy     a. Echo 05/31/12: Severe LVH, EF 40-45%, diffuse HK, grade 1 diastolic dysfunction, mild to moderate calcified aortic valve annulus, mild to moderate aortic stenosis, MAC, mild MR, moderate LAE  . Asthma     Mild    . Pruritic condition     Idiopathic  . Hyperlipidemia   . Zoster   . Noncompliance   . CKD (chronic kidney disease)     Dr. Lorrene Reid   Family History  Problem Relation Age of Onset  . Cancer Mother     "Stomach" Cancer  . Diabetes Mother   . Heart disease Mother   . Stomach cancer Mother   . Hypertension Mother   . Lymphoma Father   . Hypertension Father   . Kidney disease Paternal Grandmother   . Asthma Other   . Diabetes Sister    SOCIAL HISTORY: History  Substance Use Topics  . Smoking status: Never Smoker   . Smokeless tobacco: Never Used  . Alcohol Use: No     Comment: rare   Allergies  Allergen Reactions  . Iohexol      Code: RASH, Desc: [place] ON PT'S CHART ALLERGIC TO IV DYE 09/04/07/RM, Onset Date: [id number]    Current Outpatient Prescriptions  Medication Sig Dispense Refill  . acetaminophen (TYLENOL) 500 MG tablet Take 500 mg by mouth every 6 (six) hours as needed for pain. For pain      . allopurinol (ZYLOPRIM) 100 MG tablet Take 1 tablet (100 mg total) by mouth daily.  30 tablet  11  .  aspirin EC 81 MG tablet Take 81 mg by mouth every morning.      Marland Kitchen atorvastatin (LIPITOR) 80 MG tablet Take 1 tablet (80 mg total) by mouth daily.  30 tablet  11  . calcitRIOL (ROCALTROL) 0.25 MCG capsule       . docusate sodium (COLACE) 100 MG capsule Take 100 mg by mouth 2 (two) times daily as needed for constipation.       Marland Kitchen ezetimibe (ZETIA) 10 MG tablet Take 1 tablet (10 mg total) by mouth every morning.  30 tablet  11  . feeding supplement (GLUCERNA SHAKE) LIQD Take 237 mLs by mouth 2 (two) times daily.      Marland Kitchen gabapentin (NEURONTIN) 300 MG capsule Take 1 capsule (300 mg total) by mouth at bedtime.  30 capsule  11  . glucose blood (TRUETEST TEST) test strip Use as instructed  100 each  12  . hydrALAZINE (APRESOLINE) 25 MG tablet Take 1 tablet (25 mg total) by mouth 3 (three) times daily.  90 tablet  11  . HYDROcodone-acetaminophen (NORCO) 10-325 MG per tablet Take  1 tablet by mouth every 6 (six) hours as needed for pain.  50 tablet  0  . insulin NPH-regular (NOVOLIN 70/30) (70-30) 100 UNIT/ML injection 30 units with breakfast, and 20 units with the evening meal.  20 mL  11  . isosorbide mononitrate (IMDUR) 30 MG 24 hr tablet Take 1 tablet (30 mg total) by mouth daily.  30 tablet  11  . labetalol (NORMODYNE) 100 MG tablet Take 1 tablet (100 mg total) by mouth 2 (two) times daily.  60 tablet  11  . labetalol (NORMODYNE) 100 MG tablet TAKE 1 TABLET BY MOUTH TWICE DAILY  60 tablet  2  . latanoprost (XALATAN) 0.005 % ophthalmic solution Place 1 drop into both eyes at bedtime.      . methocarbamol (ROBAXIN) 500 MG tablet Take 250 mg by mouth 2 (two) times daily as needed (muscle spasms).      . metolazone (ZAROXOLYN) 2.5 MG tablet Take 1 tablet (2.5 mg total) by mouth every Monday, Wednesday, and Friday.  13 tablet  11  . nitroGLYCERIN (NITROSTAT) 0.4 MG SL tablet Place 0.4 mg under the tongue every 5 (five) minutes as needed for chest pain.       . pantoprazole (PROTONIX) 40 MG tablet Take 1 tablet (40 mg total) by mouth 2 (two) times daily.  60 tablet  11  . potassium chloride SA (K-DUR,KLOR-CON) 20 MEQ tablet Take 2 tablets (40 mEq total) by mouth 2 (two) times daily.  120 tablet  11  . sertraline (ZOLOFT) 100 MG tablet Take 1 tablet (100 mg total) by mouth daily.  30 tablet  11  . sorbitol 70 % solution Take 15 mLs by mouth daily as needed (constipation).       . torsemide (DEMADEX) 20 MG tablet Take 2 tablets (40 mg total) by mouth 2 (two) times daily.  40 tablet  3  . traMADol (ULTRAM) 50 MG tablet Take 1 tablet (50 mg total) by mouth every 6 (six) hours as needed for pain.  15 tablet  0  . Vitamin D, Ergocalciferol, (DRISDOL) 50000 UNITS CAPS Take 50,000 Units by mouth every Monday, Wednesday, and Friday.      . VOLTAREN 1 % GEL Apply 2 g topically daily. Apply to knee      . ZETIA 10 MG tablet TAKE 1 TABLET BY MOUTH EVERY DAY  30 tablet  0  .  zolpidem  (AMBIEN) 5 MG tablet Take 2.5 mg by mouth at bedtime as needed for sleep.       No current facility-administered medications for this visit.   REVIEW OF SYSTEMS: Valu.Nieves ] denotes positive finding; [  ] denotes negative finding  CARDIOVASCULAR:  [ ]  chest pain   [ ]  chest pressure   [ ]  palpitations   [ ]  orthopnea   [ ]  dyspnea on exertion   [ ]  claudication   [ ]  rest pain   Valu.Nieves ] DVT   [ ]  phlebitis PULMONARY:   [ ]  productive cough   Valu.Nieves ] asthma   Valu.Nieves ] wheezing NEUROLOGIC:   [ ]  weakness  [ ]  paresthesias  [ ]  aphasia  [ ]  amaurosis  [ ]  dizziness HEMATOLOGIC:   [ ]  bleeding problems   [ ]  clotting disorders MUSCULOSKELETAL:  [ ]  joint pain   [ ]  joint swelling [ ]  leg swelling GASTROINTESTINAL: [ ]   blood in stool  [ ]   hematemesis GENITOURINARY:  Valu.Nieves ]  dysuria  [ ]   hematuria PSYCHIATRIC:  [ ]  history of major depression INTEGUMENTARY:  [ ]  rashes  [ ]  ulcers CONSTITUTIONAL:  [ ]  fever   [ ]  chills  PHYSICAL EXAM: Filed Vitals:   06/23/13 1149  BP: 150/53  Pulse: 77  Height: 5\' 6"  (1.676 m)  Weight: 200 lb (90.719 kg)  SpO2: 97%   Body mass index is 32.3 kg/(m^2). GENERAL: The patient is a well-nourished female, in no acute distress. The vital signs are documented above. CARDIOVASCULAR: There is a regular rate and rhythm. The right foot is warm and well-perfused. PULMONARY: There is good air exchange bilaterally without wheezing or rales. ABDOMEN: Soft and non-tender with normal pitched bowel sounds.  MUSCULOSKELETAL: Her left below-the-knee amputation stump is warm and well-perfused. She has a small blister which is healing. Is no erythema or drainage. NEUROLOGIC: No focal weakness or paresthesias are detected. SKIN: There are no ulcers or rashes noted. PSYCHIATRIC: The patient has a normal affect.  DATA:  I have independently interpreted her arterial Doppler study today which shows monophasic Doppler signals in the right foot with an ABI of 81%. Toe pressure on the right is 67  mmHg.  MEDICAL ISSUES:  PERIPHERAL VASCULAR DISEASE She has stable peripheral vascular disease on the right which is asymptomatic currently. I've encouraged her to stay as active as possible. I've ordered followed ABIs in one year now see her back at that time. With respect to the blister on her left below-the-knee amputation, I think this will heal without any problem and then she can begin using her prosthesis again. I think it is okay to use her stump shrinker on the left BKA until then.   [facility] [place] Beeper: [phone]

## 2013-06-23 NOTE — Assessment & Plan Note (Signed)
She has stable peripheral vascular disease on the right which is asymptomatic currently. I've encouraged her to stay as active as possible. I've ordered followed ABIs in one year now see her back at that time. With respect to the blister on her left below-the-knee amputation, I think this will heal without any problem and then she can begin using her prosthesis again. I think it is okay to use her stump shrinker on the left BKA until then.

## 2013-06-24 ENCOUNTER — Ambulatory Visit: Payer: Medicare Other | Admitting: Physical Therapy

## 2013-06-29 ENCOUNTER — Encounter: Payer: Medicare Other | Admitting: Physical Therapy

## 2013-07-01 ENCOUNTER — Ambulatory Visit: Payer: Medicare Other | Admitting: Physical Therapy

## 2013-07-06 ENCOUNTER — Ambulatory Visit: Payer: Medicare Other | Admitting: Physical Therapy

## 2013-07-08 ENCOUNTER — Ambulatory Visit: Payer: Medicare Other | Admitting: Physical Therapy

## 2013-07-12 ENCOUNTER — Encounter: Payer: Self-pay | Admitting: Endocrinology

## 2013-07-12 ENCOUNTER — Ambulatory Visit (INDEPENDENT_AMBULATORY_CARE_PROVIDER_SITE_OTHER): Payer: Medicare Other | Admitting: Endocrinology

## 2013-07-12 VITALS — BP 118/80 | HR 59 | Temp 97.9°F | Ht 66.0 in

## 2013-07-12 DIAGNOSIS — N259 Disorder resulting from impaired renal tubular function, unspecified: Secondary | ICD-10-CM

## 2013-07-12 DIAGNOSIS — I5042 Chronic combined systolic (congestive) and diastolic (congestive) heart failure: Secondary | ICD-10-CM

## 2013-07-12 DIAGNOSIS — IMO0002 Reserved for concepts with insufficient information to code with codable children: Secondary | ICD-10-CM

## 2013-07-12 DIAGNOSIS — E790 Hyperuricemia without signs of inflammatory arthritis and tophaceous disease: Secondary | ICD-10-CM

## 2013-07-12 DIAGNOSIS — G4730 Sleep apnea, unspecified: Secondary | ICD-10-CM

## 2013-07-12 DIAGNOSIS — E1129 Type 2 diabetes mellitus with other diabetic kidney complication: Secondary | ICD-10-CM

## 2013-07-12 DIAGNOSIS — E1165 Type 2 diabetes mellitus with hyperglycemia: Secondary | ICD-10-CM

## 2013-07-12 DIAGNOSIS — R0602 Shortness of breath: Secondary | ICD-10-CM

## 2013-07-12 DIAGNOSIS — E559 Vitamin D deficiency, unspecified: Secondary | ICD-10-CM

## 2013-07-12 DIAGNOSIS — R7989 Other specified abnormal findings of blood chemistry: Secondary | ICD-10-CM

## 2013-07-12 MED ORDER — FLUTICASONE-SALMETEROL 100-50 MCG/DOSE IN AEPB: 1.0000 | INHALATION_SPRAY | Freq: Two times a day (BID) | RESPIRATORY_TRACT | Status: DC

## 2013-07-12 NOTE — Progress Notes (Signed)
Subjective:    Patient ID: [name], female    DOB: 05-Aug-1929, 80 y.o.   MRN: [id number]  HPI Pt is here for regular wellness examination, and is feeling pretty well in general, and says chronic med probs are stable, except as noted below. Past Medical History  Diagnosis Date  . Uterine cancer   . Diabetes mellitus     type II; peripheral neuropathy  . Hypertension   . GERD (gastroesophageal reflux disease)   . Peripheral vascular disease     s/p L BKA 08/2012  . Obesity   . Dyslipidemia   . Diastolic CHF, chronic     EF 50-55%, mild LVH and grade 1 diast. Dysfxn  . Osteoarthritis cervical spine   . DM retinopathy   . Sleep apnea     with CPAP  . History of shingles   . COPD (chronic obstructive pulmonary disease)   . Depression   . Peptic ulcer disease     duodenal  . Peptic stricture of esophagus   . Hiatal hernia   . Diverticulosis   . Arthritis   . ASCVD (arteriosclerotic cardiovascular disease)     on MRI brin  . CAD (coronary artery disease)     a. s/p multiple caths with nonobs CAD;   b. cath 1/10: pLAD 20%, mLAD 40%, pCFX 20%, mCFX 40%, pRCA 60-70%;   c.  Myoview 06/02/12: Low anterior wall scar, no ischemia, EF 37%  . Cardiomyopathy     a. Echo 05/31/12: Severe LVH, EF 40-45%, diffuse HK, grade 1 diastolic dysfunction, mild to moderate calcified aortic valve annulus, mild to moderate aortic stenosis, MAC, mild MR, moderate LAE  . Asthma     Mild  . Pruritic condition     Idiopathic  . Hyperlipidemia   . Zoster   . Noncompliance   . CKD (chronic kidney disease)     Dr. Lorrene Reid    Past Surgical History  Procedure Laterality Date  . Tubal ligation  1967  . Knee arthroscopy  10/1998    Left  . Craniotomy  1997    Left for SDH  . Cataract extraction, bilateral  2005  . Hernia repair    . Esophagogastroduodenoscopy  04/04/2004  . Spine surgery      C-spine and lumbar surgery  . Cholecystectomy  2010  . Cardiac catheterization    . Dexa   7/05  . Abdominal hysterectomy    . Amputation Left 09/04/2012    Procedure: AMPUTATION BELOW KNEE;  Surgeon: Angelia Mould, MD;  Location: [facility];  Service: Vascular;  Laterality: Left;    History   Social History  . Marital Status: Married    Spouse Name: N/A    Number of Children: 7  . Years of Education: N/A   Occupational History  . Retired    Social History Main Topics  . Smoking status: Never Smoker   . Smokeless tobacco: Never Used  . Alcohol Use: No     Comment: rare  . Drug Use: No  . Sexual Activity: No   Other Topics Concern  . Not on file   Social History Narrative  . No narrative on file    Current Outpatient Prescriptions on File Prior to Visit  Medication Sig Dispense Refill  . acetaminophen (TYLENOL) 500 MG tablet Take 500 mg by mouth every 6 (six) hours as needed for pain. For pain      . allopurinol (ZYLOPRIM) 100 MG tablet Take 1 tablet (  100 mg total) by mouth daily.  30 tablet  11  . aspirin EC 81 MG tablet Take 81 mg by mouth every morning.      Marland Kitchen atorvastatin (LIPITOR) 80 MG tablet Take 1 tablet (80 mg total) by mouth daily.  30 tablet  11  . calcitRIOL (ROCALTROL) 0.25 MCG capsule       . docusate sodium (COLACE) 100 MG capsule Take 100 mg by mouth 2 (two) times daily as needed for constipation.       Marland Kitchen ezetimibe (ZETIA) 10 MG tablet Take 1 tablet (10 mg total) by mouth every morning.  30 tablet  11  . feeding supplement (GLUCERNA SHAKE) LIQD Take 237 mLs by mouth 2 (two) times daily.      Marland Kitchen gabapentin (NEURONTIN) 300 MG capsule Take 1 capsule (300 mg total) by mouth at bedtime.  30 capsule  11  . glucose blood (TRUETEST TEST) test strip Use as instructed  100 each  12  . hydrALAZINE (APRESOLINE) 25 MG tablet Take 1 tablet (25 mg total) by mouth 3 (three) times daily.  90 tablet  11  . HYDROcodone-acetaminophen (NORCO) 10-325 MG per tablet Take 1 tablet by mouth every 6 (six) hours as needed for pain.  50 tablet  0  . insulin NPH-regular  (NOVOLIN 70/30) (70-30) 100 UNIT/ML injection 30 units with breakfast, and 20 units with the evening meal.  20 mL  11  . isosorbide mononitrate (IMDUR) 30 MG 24 hr tablet Take 1 tablet (30 mg total) by mouth daily.  30 tablet  11  . labetalol (NORMODYNE) 100 MG tablet Take 1 tablet (100 mg total) by mouth 2 (two) times daily.  60 tablet  11  . latanoprost (XALATAN) 0.005 % ophthalmic solution Place 1 drop into both eyes at bedtime.      . methocarbamol (ROBAXIN) 500 MG tablet Take 250 mg by mouth 2 (two) times daily as needed (muscle spasms).      . metolazone (ZAROXOLYN) 2.5 MG tablet Take 1 tablet (2.5 mg total) by mouth every Monday, Wednesday, and Friday.  13 tablet  11  . nitroGLYCERIN (NITROSTAT) 0.4 MG SL tablet Place 0.4 mg under the tongue every 5 (five) minutes as needed for chest pain.       . pantoprazole (PROTONIX) 40 MG tablet Take 1 tablet (40 mg total) by mouth 2 (two) times daily.  60 tablet  11  . potassium chloride SA (K-DUR,KLOR-CON) 20 MEQ tablet Take 2 tablets (40 mEq total) by mouth 2 (two) times daily.  120 tablet  11  . sertraline (ZOLOFT) 100 MG tablet Take 1 tablet (100 mg total) by mouth daily.  30 tablet  11  . sorbitol 70 % solution Take 15 mLs by mouth daily as needed (constipation).       . torsemide (DEMADEX) 20 MG tablet Take 2 tablets (40 mg total) by mouth 2 (two) times daily.  40 tablet  3  . traMADol (ULTRAM) 50 MG tablet Take 1 tablet (50 mg total) by mouth every 6 (six) hours as needed for pain.  15 tablet  0  . VOLTAREN 1 % GEL Apply 2 g topically daily. Apply to knee      . ZETIA 10 MG tablet TAKE 1 TABLET BY MOUTH EVERY DAY  30 tablet  0  . zolpidem (AMBIEN) 5 MG tablet Take 2.5 mg by mouth at bedtime as needed for sleep.       No current facility-administered medications on file prior  to visit.    Allergies  Allergen Reactions  . Iohexol      Code: RASH, Desc: [place] ON PT'S CHART ALLERGIC TO IV DYE 09/04/07/RM, Onset Date: [id number]      Family History  Problem Relation Age of Onset  . Cancer Mother     "Stomach" Cancer  . Diabetes Mother   . Heart disease Mother   . Stomach cancer Mother   . Hypertension Mother   . Lymphoma Father   . Hypertension Father   . Kidney disease Paternal Grandmother   . Asthma Other   . Diabetes Sister     BP 118/80  Pulse 59  Temp(Src) 97.9 F (36.6 C) (Oral)  Ht 5\' 6"  (1.676 m)  SpO2 99%  Review of Systems  Constitutional: Negative for fever and unexpected weight change.  HENT: Negative for hearing loss.   Eyes: Negative for visual disturbance.  Cardiovascular: Negative for chest pain.  Gastrointestinal: Negative for anal bleeding.  Endocrine: Positive for cold intolerance.  Genitourinary: Negative for hematuria.  Musculoskeletal: Positive for back pain.  Skin: Negative for rash.  Allergic/Immunologic: Negative for environmental allergies.  Neurological: Negative for syncope.  Hematological: Bruises/bleeds easily.  Psychiatric/Behavioral: Negative for dysphoric mood.       Objective:   Physical Exam VS: see vs page GEN: no distress.  Obese.  In wheelchair HEAD: head: no deformity eyes: no periorbital swelling, no proptosis external nose and ears are normal mouth: no lesion seen NECK: supple, thyroid is not enlarged CHEST WALL: no deformity LUNGS:  Clear to auscultation BREASTS:  sees gyn CV: reg rate and rhythm, no murmur ABD: abdomen is soft, nontender.  no hepatosplenomegaly.  not distended.  no hernia GENITALIA/RECTAL: sees gyn MUSCULOSKELETAL: muscle bulk and strength are grossly normal.  no obvious joint swelling.  gait is normal and steady PULSES: no carotid bruit NEURO:  cn 2-12 grossly intact.   readily moves all 4's.  SKIN:  Normal texture and temperature.  No rash or suspicious lesion is visible.   NODES:  None palpable at the neck PSYCH: alert, well-oriented.  Does not appear anxious nor depressed.      Assessment & Plan:  Wellness visit  today, with problems stable, except as noted. we discussed code status.  pt requests full code, but would not want to be started or maintained on artificial life-support measures if there was not a reasonable chance of recovery.       SEPARATE EVALUATION FOLLOWS--EACH PROBLEM HERE IS NEW, NOT RESPONDING TO TREATMENT, OR POSES SIGNIFICANT RISK TO THE PATIENT'S HEALTH: HISTORY OF THE PRESENT ILLNESS: Pt returns for f/u of insulin-requiring DM (dx'ed 1985, on a routine blood test; she has moderate neuropathy of the lower extremities; she has associated nephropathy, CAD, and PAD; therapy has limited by pt's request for least expensive insulin, and by her need for a simple regimen; she was seen in ER in early 2014, with severe hypoglycemia; she has never had DKA).  no cbg record, but states cbg's vary from 53-300.  It is in general higher as the day goes on.   PAST MEDICAL HISTORY reviewed and up to date today REVIEW OF SYSTEMS: She has dysuria and slight wheezing. PHYSICAL EXAMINATION: VITAL SIGNS:  See vs page GENERAL: no distress LAB/XRAY RESULTS: BNP=elevated Lab Results  Component Value Date   CREATININE 1.5* 07/12/2013   BUN 20 07/12/2013   NA 141 07/12/2013   K 4.6 07/12/2013   CL 108 07/12/2013   CO2 24 07/12/2013  Lab Results  Component Value Date   HGBA1C 7.3* 07/12/2013  IMPRESSION: DM: Based on the pattern of her cbg's, she needs some adjustment in her therapy Renal failure: this limits rx of CHF.  We have to weigh risks and benefits. CHF: given that her 42 sat is ok, i hesitate to increase diuretic rx now, in view ov elevated creat UTI, recurrent Sleep apnea.  She is overdue for f/u PLAN: See instruction page

## 2013-07-12 NOTE — Patient Instructions (Addendum)
blood tests are being requested for you today.  We'll contact you with results. Please come back for a follow-up appointment in 3 months Please take the insulin 40 units with breakfast, and 10 units with the evening meal. check your blood sugar twice a day.  vary the time of day when you check, between before the 3 meals, and at bedtime.  also check if you have symptoms of your blood sugar being too high or too low.  please keep a record of the readings and bring it to your next appointment here.  please call us sooner if your blood sugar goes below 70, or if you have a lot of readings over 200.   Refer back to dr clance.  you will receive a phone call, about a day and time for an appointment.   Please resume your inhaler.  i have sent a prescription to your pharmacy.   please consider these measures for your health:  minimize alcohol.  do not use tobacco products.  have a colonoscopy at least every 10 years from age 58.  Women should have an annual mammogram from age 46.  keep firearms safely stored.  always use seat belts.  have working smoke alarms in your home.  see an eye doctor and dentist regularly.  never drive under the influence of alcohol or drugs (including prescription drugs).   You should have a vaccine against shingles (a painful rash which results from the  chickenpox infection which most people had many years ago).  This vaccine reduces, but does not totally eliminate the risk of shingles.  Because this is a medicare part d benefit, you should get it at a pharmacy.

## 2013-07-13 ENCOUNTER — Encounter: Payer: Medicare Other | Admitting: Physical Therapy

## 2013-07-13 LAB — URINALYSIS, ROUTINE W REFLEX MICROSCOPIC
Bilirubin Urine: NEGATIVE
KETONES UR: NEGATIVE
Nitrite: POSITIVE — AB
Total Protein, Urine: 100 — AB
UROBILINOGEN UA: 0.2 (ref 0.0–1.0)
Urine Glucose: NEGATIVE
pH: 5 (ref 5.0–8.0)

## 2013-07-13 LAB — BASIC METABOLIC PANEL
BUN: 20 mg/dL (ref 6–23)
CHLORIDE: 108 meq/L (ref 96–112)
CO2: 24 meq/L (ref 19–32)
Calcium: 9.3 mg/dL (ref 8.4–10.5)
Creatinine, Ser: 1.5 mg/dL — ABNORMAL HIGH (ref 0.4–1.2)
GFR: 42.77 mL/min — ABNORMAL LOW (ref >60.00)
Glucose, Bld: 101 mg/dL — ABNORMAL HIGH (ref 70–99)
Potassium: 4.6 mEq/L (ref 3.5–5.1)
Sodium: 141 mEq/L (ref 135–145)

## 2013-07-13 LAB — URIC ACID: Uric Acid, Serum: 5.1 mg/dL (ref 2.4–7.0)

## 2013-07-13 LAB — HEMOGLOBIN A1C: HEMOGLOBIN A1C: 7.3 % — AB (ref 4.6–6.5)

## 2013-07-13 LAB — BRAIN NATRIURETIC PEPTIDE: Pro B Natriuretic peptide (BNP): 465 pg/mL — ABNORMAL HIGH (ref 0.0–100.0)

## 2013-07-13 MED ORDER — CIPROFLOXACIN HCL 500 MG PO TABS: 500.0000 mg | ORAL_TABLET | Freq: Every day | ORAL | Status: DC

## 2013-07-14 LAB — PTH, INTACT AND CALCIUM
Calcium: 9.6 mg/dL (ref 8.4–10.5)
PTH: 112.1 pg/mL — ABNORMAL HIGH (ref 14.0–72.0)

## 2013-07-14 LAB — VITAMIN D 25 HYDROXY (VIT D DEFICIENCY, FRACTURES): VIT D 25 HYDROXY: 38 ng/mL (ref 30–89)

## 2013-07-15 ENCOUNTER — Encounter: Payer: Medicare Other | Admitting: Physical Therapy

## 2013-07-20 ENCOUNTER — Encounter: Payer: Medicare Other | Admitting: Physical Therapy

## 2013-07-21 ENCOUNTER — Encounter: Payer: Self-pay | Admitting: Pulmonary Disease

## 2013-07-21 ENCOUNTER — Ambulatory Visit (INDEPENDENT_AMBULATORY_CARE_PROVIDER_SITE_OTHER): Payer: Medicare Other | Admitting: Pulmonary Disease

## 2013-07-21 VITALS — BP 128/82 | HR 56 | Temp 97.6°F

## 2013-07-21 DIAGNOSIS — G4733 Obstructive sleep apnea (adult) (pediatric): Secondary | ICD-10-CM

## 2013-07-21 NOTE — Assessment & Plan Note (Signed)
The patient is still wearing CPAP fairly compliantly, but feels that her machine needs to be checked to make sure it is in working order. She is also overdue for a mask and new supplies. We'll get her back in touch with her home care company, and I have stressed the importance of keeping up with her supplies on a regular basis. I've also encouraged her to work aggressively on weight loss.

## 2013-07-21 NOTE — Progress Notes (Signed)
   Subjective:    Patient ID: [name], female    DOB: 07-02-1929, 80 y.o.   MRN: [id number]  HPI The patient comes in today for followup of her obstructive sleep apnea. She has been diagnosed with severe OSA, and was started on CPAP. She has not been seen in almost 3 years, but tells me that she is continuing to use her CPAP compliantly. She has not had a mask cushion change in a year, and thinks that her machine needs to be checked again. The husband states that he has not heard breakthrough snoring when she wears the device, but the patient is having sleep disruption from chronic pain.   Review of Systems  Constitutional: Negative for fever and unexpected weight change.  HENT: Negative for congestion, dental problem, ear pain, nosebleeds, postnasal drip, rhinorrhea, sinus pressure, sneezing, sore throat and trouble swallowing.   Eyes: Negative for redness and itching.  Respiratory: Negative for cough, chest tightness, shortness of breath and wheezing.   Cardiovascular: Negative for palpitations and leg swelling.  Gastrointestinal: Negative for nausea and vomiting.  Genitourinary: Negative for dysuria.  Musculoskeletal: Negative for joint swelling.  Skin: Negative for rash.  Neurological: Negative for headaches.  Hematological: Does not bruise/bleed easily.  Psychiatric/Behavioral: Negative for dysphoric mood. The patient is not nervous/anxious.        Objective:   Physical Exam Obese female in no acute distress  Nose without purulence or discharge noted No skin breakdown or pressure necrosis from the CPAP mask Neck without lymphadenopathy or thyromegaly Right lower extremity with mild edema Alert and oriented, does not appear to be sleepy, moves all 4 extremities.       Assessment & Plan:

## 2013-07-21 NOTE — Patient Instructions (Signed)
Will send an order to your homecare company to check your machine and make sure in working order. Will also order you a new mask and supplies Work on weight loss followup with me in one year if doing well.

## 2013-07-22 ENCOUNTER — Encounter: Payer: Medicare Other | Admitting: Physical Therapy

## 2013-07-23 ENCOUNTER — Ambulatory Visit (INDEPENDENT_AMBULATORY_CARE_PROVIDER_SITE_OTHER): Payer: Medicare Other | Admitting: Cardiology

## 2013-07-23 ENCOUNTER — Encounter: Payer: Self-pay | Admitting: Cardiology

## 2013-07-23 VITALS — BP 173/75 | HR 65 | Ht 66.0 in | Wt 218.0 lb

## 2013-07-23 DIAGNOSIS — E1159 Type 2 diabetes mellitus with other circulatory complications: Secondary | ICD-10-CM

## 2013-07-23 DIAGNOSIS — I10 Essential (primary) hypertension: Secondary | ICD-10-CM

## 2013-07-23 DIAGNOSIS — I5042 Chronic combined systolic (congestive) and diastolic (congestive) heart failure: Secondary | ICD-10-CM

## 2013-07-23 DIAGNOSIS — I152 Hypertension secondary to endocrine disorders: Secondary | ICD-10-CM

## 2013-07-23 DIAGNOSIS — I2510 Atherosclerotic heart disease of native coronary artery without angina pectoris: Secondary | ICD-10-CM

## 2013-07-23 DIAGNOSIS — E1169 Type 2 diabetes mellitus with other specified complication: Secondary | ICD-10-CM

## 2013-07-23 MED ORDER — METOLAZONE 2.5 MG PO TABS
2.5000 mg | ORAL_TABLET | Freq: Every day | ORAL | Status: DC
Start: 2013-07-23 — End: 2013-11-15

## 2013-07-23 NOTE — Progress Notes (Signed)
Patient ID: [name], female   DOB: [birth year], 80 y.o.   MRN: [id number]    Date:  07/23/2013   ID:  [name], DOB 1929/07/21, MRN [id number]  PCP:  Renato Shin, MD  Cardiologist:  Dr. Jenell Milliner , last seen by Richardson Dopp in July 2014    History of Present Illness: [name] is a 80 y.o. female who returns for f/u.    She has a h/o diastolic CHF, nonobstructive CAD, DM2, HTN, HL, sleep apnea and CKD. Admitted to [facility] [id number] with a/c diastolic CHF and with elevated enzymes that were 2/2 CHF + CKD. Admitted with CP in 05/2012.  She r/o for MI.  Echo 05/31/12: Severe LVH, EF 40-45%, diffuse HK, grade 1 diastolic dysfunction, mild to moderate calcified aortic valve annulus, mild to moderate aortic stenosis, MAC, mild MR, moderate LAE.  Myoview was arranged for risk stratification. Myoview 06/02/12: Low anterior wall scar, no ischemia, EF 37%. It is felt that she would be high risk for contrast-induced nephropathy given her CKD.  Last seen in this office 06/16/2012.  Since then, she presented to the hospital in 08/2012 with an ischemic left leg.  She ultimately underwent L BKA with Dr. Scot Dock.  Since she saw Richardson Dopp in July 2014 she had her left leg amputated above her knee for peripheral arterial disease. Patient is coming today for followup she reports of worsening dyspnea. The patient has been seen by her pulmonologist on just 2 days ago and her CPAP settings have been adjusted but haven't been changed on her machine yet. The patient also states that for the last couple of weeks edema on her right lower extremity has been worsening. She is also experiencing chest pain at that time when she feels dyspnea on exertion.  Sleeps on 2 pillows without change.  No PND.  She is taking all her medications and states that her blood pressure is usually controlled and her primary care doctor's office.    Wt Readings from Last 3 Encounters:  07/23/13 218 lb (98.884 kg)    06/23/13 200 lb (90.719 kg)  12/29/12 200 lb (90.719 kg)    Past Medical History  Diagnosis Date  . Uterine cancer   . Diabetes mellitus     type II; peripheral neuropathy  . Hypertension   . GERD (gastroesophageal reflux disease)   . Peripheral vascular disease     s/p L BKA 08/2012  . Obesity   . Dyslipidemia   . Diastolic CHF, chronic     EF 50-55%, mild LVH and grade 1 diast. Dysfxn  . Osteoarthritis cervical spine   . DM retinopathy   . Sleep apnea     with CPAP  . History of shingles   . COPD (chronic obstructive pulmonary disease)   . Depression   . Peptic ulcer disease     duodenal  . Peptic stricture of esophagus   . Hiatal hernia   . Diverticulosis   . Arthritis   . ASCVD (arteriosclerotic cardiovascular disease)     on MRI brin  . CAD (coronary artery disease)     a. s/p multiple caths with nonobs CAD;   b. cath 1/10: pLAD 20%, mLAD 40%, pCFX 20%, mCFX 40%, pRCA 60-70%;   c.  Myoview 06/02/12: Low anterior wall scar, no ischemia, EF 37%  . Cardiomyopathy     a. Echo 05/31/12: Severe LVH, EF 40-45%, diffuse HK, grade 1 diastolic dysfunction, mild to moderate calcified aortic valve annulus,  mild to moderate aortic stenosis, MAC, mild MR, moderate LAE  . Asthma     Mild  . Pruritic condition     Idiopathic  . Hyperlipidemia   . Zoster   . Noncompliance   . CKD (chronic kidney disease)     Dr. Lorrene Reid    Current Outpatient Prescriptions  Medication Sig Dispense Refill  . acetaminophen (TYLENOL) 500 MG tablet Take 500 mg by mouth every 6 (six) hours as needed for pain. For pain      . allopurinol (ZYLOPRIM) 100 MG tablet Take 1 tablet (100 mg total) by mouth daily.  30 tablet  11  . aspirin EC 81 MG tablet Take 81 mg by mouth every morning.      Marland Kitchen atorvastatin (LIPITOR) 80 MG tablet Take 1 tablet (80 mg total) by mouth daily.  30 tablet  11  . calcitRIOL (ROCALTROL) 0.25 MCG capsule       . ciprofloxacin (CIPRO) 500 MG tablet Take 1 tablet (500 mg total) by  mouth daily.  7 tablet  0  . docusate sodium (COLACE) 100 MG capsule Take 100 mg by mouth 2 (two) times daily as needed for constipation.       Marland Kitchen ezetimibe (ZETIA) 10 MG tablet Take 1 tablet (10 mg total) by mouth every morning.  30 tablet  11  . feeding supplement (GLUCERNA SHAKE) LIQD Take 237 mLs by mouth 2 (two) times daily.      . Fluticasone-Salmeterol (ADVAIR DISKUS) 100-50 MCG/DOSE AEPB Inhale 1 puff into the lungs 2 (two) times daily.  1 each  11  . gabapentin (NEURONTIN) 300 MG capsule Take 1 capsule (300 mg total) by mouth at bedtime.  30 capsule  11  . glucose blood (TRUETEST TEST) test strip Use as instructed  100 each  12  . hydrALAZINE (APRESOLINE) 25 MG tablet Take 1 tablet (25 mg total) by mouth 3 (three) times daily.  90 tablet  11  . HYDROcodone-acetaminophen (NORCO) 10-325 MG per tablet Take 1 tablet by mouth every 6 (six) hours as needed for pain.  50 tablet  0  . insulin NPH-regular (NOVOLIN 70/30) (70-30) 100 UNIT/ML injection 30 units with breakfast, and 20 units with the evening meal.  20 mL  11  . isosorbide mononitrate (IMDUR) 30 MG 24 hr tablet Take 1 tablet (30 mg total) by mouth daily.  30 tablet  11  . labetalol (NORMODYNE) 100 MG tablet Take 1 tablet (100 mg total) by mouth 2 (two) times daily.  60 tablet  11  . latanoprost (XALATAN) 0.005 % ophthalmic solution Place 1 drop into both eyes at bedtime.      . methocarbamol (ROBAXIN) 500 MG tablet Take 250 mg by mouth 2 (two) times daily as needed (muscle spasms).      . metolazone (ZAROXOLYN) 2.5 MG tablet Take 1 tablet (2.5 mg total) by mouth every Monday, Wednesday, and Friday.  13 tablet  11  . nitroGLYCERIN (NITROSTAT) 0.4 MG SL tablet Place 0.4 mg under the tongue every 5 (five) minutes as needed for chest pain.       . pantoprazole (PROTONIX) 40 MG tablet Take 1 tablet (40 mg total) by mouth 2 (two) times daily.  60 tablet  11  . potassium chloride SA (K-DUR,KLOR-CON) 20 MEQ tablet Take 2 tablets (40 mEq total) by  mouth 2 (two) times daily.  120 tablet  11  . sertraline (ZOLOFT) 100 MG tablet Take 1 tablet (100 mg total) by mouth daily.  30 tablet  11  . sorbitol 70 % solution Take 15 mLs by mouth daily as needed (constipation).       . torsemide (DEMADEX) 20 MG tablet Take 2 tablets (40 mg total) by mouth 2 (two) times daily.  40 tablet  3  . traMADol (ULTRAM) 50 MG tablet Take 1 tablet (50 mg total) by mouth every 6 (six) hours as needed for pain.  15 tablet  0  . VOLTAREN 1 % GEL Apply 2 g topically daily. Apply to knee      . zolpidem (AMBIEN) 5 MG tablet Take 2.5 mg by mouth at bedtime as needed for sleep.       No current facility-administered medications for this visit.    Allergies:    Allergies  Allergen Reactions  . Iohexol      Code: RASH, Desc: [place] ON PT'S CHART ALLERGIC TO IV DYE 09/04/07/RM, Onset Date: [id number]     Social History:  The patient  reports that she has never smoked. She has never used smokeless tobacco. She reports that she does not drink alcohol or use illicit drugs.   ROS:  Please see the history of present illness.      All other systems reviewed and negative.   PHYSICAL EXAM: VS:  BP 173/75  Pulse 65  Ht 5\' 6"  (1.676 m)  Wt 218 lb (98.884 kg)  BMI 35.20 kg/m2 Well nourished, well developed, in no acute distress HEENT: normal Neck: no JVD at 90 Cardiac:  normal S1, S2; RRR; no murmur Lungs:  clear to auscultation bilaterally, no wheezing, rhonchi or rales Abd: soft, nontender Ext: mild RLE edema at the ankles Skin: warm and dry Neuro:  CNs 2-12 intact, no focal abnormalities noted  EKG:  NSR, HR 60, inferolateral T wave changes, no significant change since prior tracing, QTc 490     ASSESSMENT AND PLAN:  1. Chronic Combined Systolic and Diastolic CHF:  Patient is currently on torsemide 20 twice a day and metolazone 2.5 mg Monday Wednesday and Friday. We will increase metolazone to daily dose and follow in 2 weeks and then we will also repeat  her labs.  We will also see by again if her shortness of breath improves with adjustment of her CPAP machine.  2. CAD:   Scar of prior nuclear stress testing with left ventricular ejection fraction 37%, cardiac cath was never performed because of her chronic kidney disease. If the patient has persistent symptoms of dyspnea and chest pain on exertion at the next visit we will order a stress test.  Continue ASA and statin.    3. Cardiomyopathy:  No ACE or ARB due to CKD.  Continue beta blocker, hydralazine, nitrates.    4. CKD:  Followed by nephrology.  5. Hypertension:  uncontrolled.  But controlled at her PCP office. We are adding metolazone and see blood pressure the next visit.   6. Hyperlipidemia:  Continue statin.  7. PAD, s/p L BKA:  F/u with [facility] as planned.   Disposition:  F/u in 2 weeks.      Ena Dawley, H  07/23/2013 3:41 PM

## 2013-07-23 NOTE — Patient Instructions (Signed)
Your physician has recommended you make the following change in your medication: 1. Change Metolazone to 1 tablet daily ( A New Rx has been sent into your pharmacy for you.)  Your physician recommends that you schedule a follow-up appointment in: 2 weeks with Dr Meda Coffee. If Dr. Meda Coffee is full it is ok to schedule with Richardson Dopp.

## 2013-07-27 ENCOUNTER — Encounter: Payer: Medicare Other | Admitting: Physical Therapy

## 2013-07-29 ENCOUNTER — Ambulatory Visit: Payer: Medicare Other | Admitting: Physical Therapy

## 2013-08-03 ENCOUNTER — Encounter: Payer: Medicare Other | Admitting: Physical Therapy

## 2013-08-05 ENCOUNTER — Encounter: Payer: Self-pay | Admitting: Cardiology

## 2013-08-05 ENCOUNTER — Ambulatory Visit (INDEPENDENT_AMBULATORY_CARE_PROVIDER_SITE_OTHER): Payer: Medicare Other | Admitting: Cardiology

## 2013-08-05 ENCOUNTER — Ambulatory Visit: Payer: Medicare Other | Admitting: Physical Therapy

## 2013-08-05 VITALS — BP 130/60 | HR 60 | Ht 66.0 in | Wt 209.0 lb

## 2013-08-05 DIAGNOSIS — I5042 Chronic combined systolic (congestive) and diastolic (congestive) heart failure: Secondary | ICD-10-CM

## 2013-08-05 LAB — BASIC METABOLIC PANEL
BUN: 20 mg/dL (ref 6–23)
CO2: 28 mEq/L (ref 19–32)
Calcium: 9.2 mg/dL (ref 8.4–10.5)
Chloride: 106 mEq/L (ref 96–112)
Creatinine, Ser: 1.4 mg/dL — ABNORMAL HIGH (ref 0.4–1.2)
GFR: 46.31 mL/min — ABNORMAL LOW (ref >60.00)
Glucose, Bld: 139 mg/dL — ABNORMAL HIGH (ref 70–99)
Potassium: 4.4 mEq/L (ref 3.5–5.1)
Sodium: 141 mEq/L (ref 135–145)

## 2013-08-05 LAB — BRAIN NATRIURETIC PEPTIDE: Pro B Natriuretic peptide (BNP): 400 pg/mL — ABNORMAL HIGH (ref 0.0–100.0)

## 2013-08-05 MED ORDER — ALBUTEROL SULFATE HFA 108 (90 BASE) MCG/ACT IN AERS
2.0000 | INHALATION_SPRAY | RESPIRATORY_TRACT | Status: DC | PRN
Start: 2013-08-05 — End: 2015-04-19

## 2013-08-05 NOTE — Addendum Note (Signed)
**Note De-Identified Cerys [facility] Obfuscation** Addended by: Dennie Fetters on: 08/05/2013 10:33 AM   Modules accepted: Orders

## 2013-08-05 NOTE — Addendum Note (Signed)
Addended by: Dennie Fetters on: 08/05/2013 10:30 AM   Modules accepted: Orders

## 2013-08-05 NOTE — Progress Notes (Signed)
Patient ID: [name], female   DOB: July 01, 1929, 80 y.o.   MRN: [id number]    Date:  08/05/2013   ID:  [name], DOB 10-07-1929, MRN [id number]  PCP:  Renato Shin, MD  Cardiologist:  Dr. Jenell Milliner , last seen by Richardson Dopp in July 2014    History of Present Illness: [name] is a 80 y.o. female who returns for f/u.    She has a h/o diastolic CHF, nonobstructive CAD, DM2, HTN, HL, sleep apnea and CKD. Admitted to [id number] with a/c diastolic CHF and with elevated enzymes that were 2/2 CHF + CKD. Admitted with CP in 05/2012.  She r/o for MI.  Echo 05/31/12: Severe LVH, EF 40-45%, diffuse HK, grade 1 diastolic dysfunction, mild to moderate calcified aortic valve annulus, mild to moderate aortic stenosis, MAC, mild MR, moderate LAE.  Myoview was arranged for risk stratification. Myoview 06/02/12: Low anterior wall scar, no ischemia, EF 37%. It is felt that she would be high risk for contrast-induced nephropathy given her CKD.  Last seen in this office 06/16/2012.  Since then, she presented to the hospital in 08/2012 with an ischemic left leg.  She ultimately underwent L BKA with Dr. Scot Dock.  Since she saw Richardson Dopp in July 2014 she had her left leg amputated above her knee for peripheral arterial disease. Patient is coming today for followup she reports of worsening dyspnea. The patient has been seen by her pulmonologist on just 2 days ago and her CPAP settings have been adjusted but haven't been changed on her machine yet. The patient also states that for the last couple of weeks edema on her right lower extremity has been worsening. She is also experiencing chest pain at that time when she feels dyspnea on exertion.  Sleeps on 2 pillows without change.  No PND.  She is taking all her medications and states that her blood pressure is usually controlled and her primary care doctor's office.    On 08/05/2013 - improved SOB, feels better after CPAP parameters were  adjusted. LE edema has improved. She feels well. No palpitations.  The only complains is wheezing after she wakes up, she uses Advair inhaler twice daily.   Wt Readings from Last 3 Encounters:  08/05/13 209 lb (94.802 kg)  07/23/13 218 lb (98.884 kg)  06/23/13 200 lb (90.719 kg)    Past Medical History  Diagnosis Date  . Uterine cancer   . Diabetes mellitus     type II; peripheral neuropathy  . Hypertension   . GERD (gastroesophageal reflux disease)   . Peripheral vascular disease     s/p L BKA 08/2012  . Obesity   . Dyslipidemia   . Diastolic CHF, chronic     EF 50-55%, mild LVH and grade 1 diast. Dysfxn  . Osteoarthritis cervical spine   . DM retinopathy   . Sleep apnea     with CPAP  . History of shingles   . COPD (chronic obstructive pulmonary disease)   . Depression   . Peptic ulcer disease     duodenal  . Peptic stricture of esophagus   . Hiatal hernia   . Diverticulosis   . Arthritis   . ASCVD (arteriosclerotic cardiovascular disease)     on MRI brin  . CAD (coronary artery disease)     a. s/p multiple caths with nonobs CAD;   b. cath 1/10: pLAD 20%, mLAD 40%, pCFX 20%, mCFX 40%, pRCA 60-70%;   c.  Myoview 06/02/12: Low anterior wall scar, no ischemia, EF 37%  . Cardiomyopathy     a. Echo 05/31/12: Severe LVH, EF 40-45%, diffuse HK, grade 1 diastolic dysfunction, mild to moderate calcified aortic valve annulus, mild to moderate aortic stenosis, MAC, mild MR, moderate LAE  . Asthma     Mild  . Pruritic condition     Idiopathic  . Hyperlipidemia   . Zoster   . Noncompliance   . CKD (chronic kidney disease)     Dr. Lorrene Reid    Current Outpatient Prescriptions  Medication Sig Dispense Refill  . acetaminophen (TYLENOL) 500 MG tablet Take 500 mg by mouth every 6 (six) hours as needed for pain. For pain      . allopurinol (ZYLOPRIM) 100 MG tablet Take 1 tablet (100 mg total) by mouth daily.  30 tablet  11  . aspirin EC 81 MG tablet Take 81 mg by mouth every  morning.      Marland Kitchen atorvastatin (LIPITOR) 80 MG tablet Take 1 tablet (80 mg total) by mouth daily.  30 tablet  11  . calcitRIOL (ROCALTROL) 0.25 MCG capsule       . ciprofloxacin (CIPRO) 500 MG tablet Take 1 tablet (500 mg total) by mouth daily.  7 tablet  0  . docusate sodium (COLACE) 100 MG capsule Take 100 mg by mouth 2 (two) times daily as needed for constipation.       Marland Kitchen ezetimibe (ZETIA) 10 MG tablet Take 1 tablet (10 mg total) by mouth every morning.  30 tablet  11  . feeding supplement (GLUCERNA SHAKE) LIQD Take 237 mLs by mouth 2 (two) times daily.      . Fluticasone-Salmeterol (ADVAIR DISKUS) 100-50 MCG/DOSE AEPB Inhale 1 puff into the lungs 2 (two) times daily.  1 each  11  . gabapentin (NEURONTIN) 300 MG capsule Take 1 capsule (300 mg total) by mouth at bedtime.  30 capsule  11  . glucose blood (TRUETEST TEST) test strip Use as instructed  100 each  12  . hydrALAZINE (APRESOLINE) 25 MG tablet Take 1 tablet (25 mg total) by mouth 3 (three) times daily.  90 tablet  11  . HYDROcodone-acetaminophen (NORCO) 10-325 MG per tablet Take 1 tablet by mouth every 6 (six) hours as needed for pain.  50 tablet  0  . insulin NPH-regular (NOVOLIN 70/30) (70-30) 100 UNIT/ML injection 30 units with breakfast, and 20 units with the evening meal.  20 mL  11  . isosorbide mononitrate (IMDUR) 30 MG 24 hr tablet Take 1 tablet (30 mg total) by mouth daily.  30 tablet  11  . labetalol (NORMODYNE) 100 MG tablet Take 1 tablet (100 mg total) by mouth 2 (two) times daily.  60 tablet  11  . latanoprost (XALATAN) 0.005 % ophthalmic solution Place 1 drop into both eyes at bedtime.      . methocarbamol (ROBAXIN) 500 MG tablet Take 250 mg by mouth 2 (two) times daily as needed (muscle spasms).      . metolazone (ZAROXOLYN) 2.5 MG tablet Take 1 tablet (2.5 mg total) by mouth daily.  30 tablet  11  . nitroGLYCERIN (NITROSTAT) 0.4 MG SL tablet Place 0.4 mg under the tongue every 5 (five) minutes as needed for chest pain.         . pantoprazole (PROTONIX) 40 MG tablet Take 1 tablet (40 mg total) by mouth 2 (two) times daily.  60 tablet  11  . potassium chloride SA (K-DUR,KLOR-CON) 20 MEQ tablet  Take 2 tablets (40 mEq total) by mouth 2 (two) times daily.  120 tablet  11  . sertraline (ZOLOFT) 100 MG tablet Take 1 tablet (100 mg total) by mouth daily.  30 tablet  11  . sorbitol 70 % solution Take 15 mLs by mouth daily as needed (constipation).       . torsemide (DEMADEX) 20 MG tablet Take 2 tablets (40 mg total) by mouth 2 (two) times daily.  40 tablet  3  . traMADol (ULTRAM) 50 MG tablet Take 1 tablet (50 mg total) by mouth every 6 (six) hours as needed for pain.  15 tablet  0  . VOLTAREN 1 % GEL Apply 2 g topically daily. Apply to knee      . zolpidem (AMBIEN) 5 MG tablet Take 2.5 mg by mouth at bedtime as needed for sleep.       No current facility-administered medications for this visit.    Allergies:    Allergies  Allergen Reactions  . Iohexol      Code: RASH, Desc: [place] ON PT'S CHART ALLERGIC TO IV DYE 09/04/07/RM, Onset Date: [id number]     Social History:  The patient  reports that she has never smoked. She has never used smokeless tobacco. She reports that she does not drink alcohol or use illicit drugs.   ROS:  Please see the history of present illness.      All other systems reviewed and negative.   PHYSICAL EXAM: VS:  Ht 5\' 6"  (1.676 m)  Wt 209 lb (94.802 kg)  BMI 33.75 kg/m2 Well nourished, well developed, in no acute distress HEENT: normal Neck: no JVD at 90 Cardiac:  normal S1, S2; RRR; no murmur Lungs:  clear to auscultation bilaterally, no wheezing, rhonchi or rales Abd: soft, nontender Ext: mild RLE edema at the ankles Skin: warm and dry Neuro:  CNs 2-12 intact, no focal abnormalities noted  EKG:  NSR, HR 60, inferolateral T wave changes, no significant change since prior tracing, QTc 490      ASSESSMENT AND PLAN:  1. Chronic Combined Systolic and Diastolic CHF:  Patient  is currently on torsemide 20 twice a day and metolazone 2.5 mg was increased from Monday Wednesday and Friday to daily schedule on 07/23/2013.  We will repeat her labs today.   2. CAD:   Scar of prior nuclear stress testing with left ventricular ejection fraction 37%, cardiac cath was never performed because of her chronic kidney disease. If the patient has persistent symptoms of dyspnea and chest pain on exertion at the next visit we will order a stress test.  Continue ASA and statin.    3. Cardiomyopathy:  No ACE or ARB due to CKD.  Continue beta blocker, hydralazine, nitrates.    4. CKD:  Followed by nephrology.  5. Hypertension:  uncontrolled.  But controlled at her PCP office. We are adding metolazone and see blood pressure the next visit.   6. Hyperlipidemia:  Continue statin.  7. PAD, s/p L BKA:  F/u with [facility] as planned.   8. COPD - on Advair - added Albuterol PRN  Disposition:  F/u in 3 months.      Ena Dawley, H  08/05/2013 8:54 AM

## 2013-08-05 NOTE — Patient Instructions (Signed)
Your physician has recommended you make the following change in your medication: start using Albuterol inhaler every 4 hours as needed  Your physician recommends that you return for lab work in: today  Your physician recommends that you schedule a follow-up appointment in: 3 months

## 2013-08-10 ENCOUNTER — Encounter: Payer: Medicare Other | Admitting: Physical Therapy

## 2013-08-12 ENCOUNTER — Encounter: Payer: Medicare Other | Admitting: Physical Therapy

## 2013-08-20 ENCOUNTER — Telehealth: Payer: Self-pay

## 2013-08-20 NOTE — Telephone Encounter (Signed)
Phone call rec'd from pt. with request to have Dr. Scot Dock give approval for pt. to resume physical therapy with her left leg prosthesis.  Stated the blister has healed on the left BKA stump site.  Reported she has not had physical therapy since December, and would like to resume this.  Also rec'd phone call from Jamey Reas, PT @ [facility] requesting note from Dr. Scot Dock stating pt. is medically stable to resume PT.   Discussed with Dr. Scot Dock.  Reviewed last office note from 06/23/13.  Informed Dr. Scot Dock that pt. Reported her blister @ left BKA stump has healed.  Per Dr. Scot Dock, pt. is stable from surgical standpoint to resume physical therapy.  Will fax note to Jamey Reas, PT, @ [phone].

## 2013-08-25 ENCOUNTER — Ambulatory Visit: Payer: Medicare Other | Attending: Vascular Surgery | Admitting: Physical Therapy

## 2013-08-25 DIAGNOSIS — R269 Unspecified abnormalities of gait and mobility: Secondary | ICD-10-CM | POA: Insufficient documentation

## 2013-08-25 DIAGNOSIS — S88119A Complete traumatic amputation at level between knee and ankle, unspecified lower leg, initial encounter: Secondary | ICD-10-CM | POA: Insufficient documentation

## 2013-08-25 DIAGNOSIS — Z4789 Encounter for other orthopedic aftercare: Secondary | ICD-10-CM | POA: Insufficient documentation

## 2013-08-25 DIAGNOSIS — R5381 Other malaise: Secondary | ICD-10-CM | POA: Insufficient documentation

## 2013-08-25 DIAGNOSIS — M6281 Muscle weakness (generalized): Secondary | ICD-10-CM | POA: Insufficient documentation

## 2013-08-31 ENCOUNTER — Encounter: Payer: Self-pay | Admitting: Vascular Surgery

## 2013-08-31 ENCOUNTER — Ambulatory Visit: Payer: Medicare Other | Admitting: Physical Therapy

## 2013-09-01 ENCOUNTER — Ambulatory Visit: Payer: Medicare Other | Admitting: Vascular Surgery

## 2013-09-03 ENCOUNTER — Ambulatory Visit: Payer: Medicare Other | Admitting: Physical Therapy

## 2013-09-06 ENCOUNTER — Ambulatory Visit: Payer: Medicare Other | Admitting: Physical Therapy

## 2013-09-08 ENCOUNTER — Encounter: Payer: Medicare Other | Admitting: Physical Therapy

## 2013-09-09 ENCOUNTER — Ambulatory Visit: Payer: Medicare Other | Admitting: Physical Therapy

## 2013-09-14 ENCOUNTER — Ambulatory Visit: Payer: Medicare Other | Admitting: Physical Therapy

## 2013-09-16 ENCOUNTER — Ambulatory Visit: Payer: Medicare Other | Attending: Vascular Surgery | Admitting: Physical Therapy

## 2013-09-16 DIAGNOSIS — Z4789 Encounter for other orthopedic aftercare: Secondary | ICD-10-CM | POA: Insufficient documentation

## 2013-09-16 DIAGNOSIS — S88119A Complete traumatic amputation at level between knee and ankle, unspecified lower leg, initial encounter: Secondary | ICD-10-CM | POA: Insufficient documentation

## 2013-09-16 DIAGNOSIS — M6281 Muscle weakness (generalized): Secondary | ICD-10-CM | POA: Insufficient documentation

## 2013-09-16 DIAGNOSIS — R5381 Other malaise: Secondary | ICD-10-CM | POA: Insufficient documentation

## 2013-09-16 DIAGNOSIS — R269 Unspecified abnormalities of gait and mobility: Secondary | ICD-10-CM | POA: Insufficient documentation

## 2013-09-21 ENCOUNTER — Ambulatory Visit: Payer: Medicare Other | Admitting: Physical Therapy

## 2013-09-23 ENCOUNTER — Ambulatory Visit: Payer: Medicare Other | Admitting: Physical Therapy

## 2013-09-28 ENCOUNTER — Ambulatory Visit: Payer: Medicare Other | Admitting: Physical Therapy

## 2013-09-30 ENCOUNTER — Ambulatory Visit: Payer: Medicare Other | Admitting: Physical Therapy

## 2013-10-05 ENCOUNTER — Ambulatory Visit: Payer: Medicare Other | Admitting: Physical Therapy

## 2013-10-07 ENCOUNTER — Ambulatory Visit: Payer: Medicare Other | Admitting: Physical Therapy

## 2013-10-12 ENCOUNTER — Ambulatory Visit: Payer: Medicare Other | Admitting: Physical Therapy

## 2013-10-14 ENCOUNTER — Ambulatory Visit: Payer: Medicare Other | Admitting: Physical Therapy

## 2013-10-19 ENCOUNTER — Ambulatory Visit: Payer: Medicare Other | Attending: Vascular Surgery | Admitting: Physical Therapy

## 2013-10-19 DIAGNOSIS — Z4789 Encounter for other orthopedic aftercare: Secondary | ICD-10-CM | POA: Insufficient documentation

## 2013-10-19 DIAGNOSIS — R5381 Other malaise: Secondary | ICD-10-CM | POA: Insufficient documentation

## 2013-10-19 DIAGNOSIS — R269 Unspecified abnormalities of gait and mobility: Secondary | ICD-10-CM | POA: Insufficient documentation

## 2013-10-19 DIAGNOSIS — S88119A Complete traumatic amputation at level between knee and ankle, unspecified lower leg, initial encounter: Secondary | ICD-10-CM | POA: Insufficient documentation

## 2013-10-19 DIAGNOSIS — M6281 Muscle weakness (generalized): Secondary | ICD-10-CM | POA: Insufficient documentation

## 2013-10-21 ENCOUNTER — Ambulatory Visit: Payer: Medicare Other | Admitting: Physical Therapy

## 2013-10-25 ENCOUNTER — Ambulatory Visit: Payer: Medicare Other | Admitting: Physical Therapy

## 2013-10-27 ENCOUNTER — Ambulatory Visit: Payer: Medicare Other | Admitting: Physical Therapy

## 2013-11-02 ENCOUNTER — Ambulatory Visit: Payer: Medicare Other | Admitting: Physical Therapy

## 2013-11-04 ENCOUNTER — Ambulatory Visit: Payer: Medicare Other

## 2013-11-09 ENCOUNTER — Ambulatory Visit: Payer: Medicare Other | Admitting: Physical Therapy

## 2013-11-10 ENCOUNTER — Ambulatory Visit: Payer: Medicare Other | Admitting: Cardiology

## 2013-11-11 ENCOUNTER — Ambulatory Visit: Payer: Medicare Other | Admitting: Physical Therapy

## 2013-11-12 ENCOUNTER — Ambulatory Visit: Payer: Medicare Other | Admitting: Cardiology

## 2013-11-15 ENCOUNTER — Ambulatory Visit (INDEPENDENT_AMBULATORY_CARE_PROVIDER_SITE_OTHER): Payer: Medicare Other | Admitting: Cardiology

## 2013-11-15 ENCOUNTER — Ambulatory Visit: Payer: Medicare Other | Attending: Vascular Surgery | Admitting: Physical Therapy

## 2013-11-15 ENCOUNTER — Encounter: Payer: Self-pay | Admitting: Cardiology

## 2013-11-15 VITALS — BP 136/68 | HR 60 | Ht 66.0 in | Wt 219.0 lb

## 2013-11-15 DIAGNOSIS — M6281 Muscle weakness (generalized): Secondary | ICD-10-CM | POA: Insufficient documentation

## 2013-11-15 DIAGNOSIS — R0602 Shortness of breath: Secondary | ICD-10-CM

## 2013-11-15 DIAGNOSIS — Z4789 Encounter for other orthopedic aftercare: Secondary | ICD-10-CM | POA: Insufficient documentation

## 2013-11-15 DIAGNOSIS — Z79899 Other long term (current) drug therapy: Secondary | ICD-10-CM

## 2013-11-15 DIAGNOSIS — R5381 Other malaise: Secondary | ICD-10-CM | POA: Insufficient documentation

## 2013-11-15 DIAGNOSIS — R269 Unspecified abnormalities of gait and mobility: Secondary | ICD-10-CM | POA: Insufficient documentation

## 2013-11-15 DIAGNOSIS — S88119A Complete traumatic amputation at level between knee and ankle, unspecified lower leg, initial encounter: Secondary | ICD-10-CM | POA: Insufficient documentation

## 2013-11-15 LAB — BASIC METABOLIC PANEL
BUN: 22 mg/dL (ref 6–23)
CO2: 28 mEq/L (ref 19–32)
Calcium: 9 mg/dL (ref 8.4–10.5)
Chloride: 106 mEq/L (ref 96–112)
Creatinine, Ser: 1.7 mg/dL — ABNORMAL HIGH (ref 0.4–1.2)
GFR: 36.99 mL/min — ABNORMAL LOW (ref >60.00)
Glucose, Bld: 59 mg/dL — ABNORMAL LOW (ref 70–99)
Potassium: 4.6 mEq/L (ref 3.5–5.1)
Sodium: 143 mEq/L (ref 135–145)

## 2013-11-15 LAB — BRAIN NATRIURETIC PEPTIDE: Pro B Natriuretic peptide (BNP): 158 pg/mL — ABNORMAL HIGH (ref 0.0–100.0)

## 2013-11-15 NOTE — Progress Notes (Signed)
Patient ID: [name], female   DOB: 02/06/1930, 80 y.o.   MRN: [id number]    Date:  11/15/2013   ID:  [name], DOB 1930/02/19, MRN [id number]  PCP:  Renato Shin, MD  Cardiologist:  Dr. Jenell Milliner , last seen by Richardson Dopp in July 2014    History of Present Illness: [name] is a 80 y.o. female who returns for f/u.    She has a h/o diastolic CHF, nonobstructive CAD, DM2, HTN, HL, sleep apnea and CKD. Admitted to [facility] 02/2425 with a/c diastolic CHF and with elevated enzymes that were 2/2 CHF + CKD. Admitted with CP in 05/2012.  She r/o for MI.  Echo 05/31/12: Severe LVH, EF 40-45%, diffuse HK, grade 1 diastolic dysfunction, mild to moderate calcified aortic valve annulus, mild to moderate aortic stenosis, MAC, mild MR, moderate LAE.  Myoview was arranged for risk stratification. Myoview 06/02/12: Low anterior wall scar, no ischemia, EF 37%. It is felt that she would be high risk for contrast-induced nephropathy given her CKD.  Last seen in this office 06/16/2012.  Since then, she presented to the hospital in 08/2012 with an ischemic left leg.  She ultimately underwent L BKA with Dr. Scot Dock.  Since she saw Richardson Dopp in July 2014 she had her left leg amputated above her knee for peripheral arterial disease. Patient is coming today for followup she reports of worsening dyspnea. The patient has been seen by her pulmonologist on just 2 days ago and her CPAP settings have been adjusted but haven't been changed on her machine yet. The patient also states that for the last couple of weeks edema on her right lower extremity has been worsening. She is also experiencing chest pain at that time when she feels dyspnea on exertion.  Sleeps on 2 pillows without change.  No PND.  She is taking all her medications and states that her blood pressure is usually controlled and her primary care doctor's office.   On 08/05/2013 - improved SOB, feels better after CPAP parameters were  adjusted. LE edema has improved. She feels well. No palpitations.  The only complains is wheezing after she wakes up, she uses Advair inhaler twice daily.   11/15/2013 - the patient reports that her lower extremity edema has improved however her shortness of breath has persisted and she is experiencing paroxysmal nocturnal dyspnea about twice a week. She has had an episode of sinusitis with productive cough that has resolved about a week ago. She still experiencing nasal congestion.  Wt Readings from Last 3 Encounters:  11/15/13 219 lb (99.338 kg)  08/05/13 209 lb (94.802 kg)  07/23/13 218 lb (98.884 kg)    Past Medical History  Diagnosis Date  . Uterine cancer   . Diabetes mellitus     type II; peripheral neuropathy  . Hypertension   . GERD (gastroesophageal reflux disease)   . Peripheral vascular disease     s/p L BKA 08/2012  . Obesity   . Dyslipidemia   . Diastolic CHF, chronic     EF 50-55%, mild LVH and grade 1 diast. Dysfxn  . Osteoarthritis cervical spine   . DM retinopathy   . Sleep apnea     with CPAP  . History of shingles   . COPD (chronic obstructive pulmonary disease)   . Depression   . Peptic ulcer disease     duodenal  . Peptic stricture of esophagus   . Hiatal hernia   . Diverticulosis   .  Arthritis   . ASCVD (arteriosclerotic cardiovascular disease)     on MRI brin  . CAD (coronary artery disease)     a. s/p multiple caths with nonobs CAD;   b. cath 1/10: pLAD 20%, mLAD 40%, pCFX 20%, mCFX 40%, pRCA 60-70%;   c.  Myoview 06/02/12: Low anterior wall scar, no ischemia, EF 37%  . Cardiomyopathy     a. Echo 05/31/12: Severe LVH, EF 40-45%, diffuse HK, grade 1 diastolic dysfunction, mild to moderate calcified aortic valve annulus, mild to moderate aortic stenosis, MAC, mild MR, moderate LAE  . Asthma     Mild  . Pruritic condition     Idiopathic  . Hyperlipidemia   . Zoster   . Noncompliance   . CKD (chronic kidney disease)     Dr. Lorrene Reid    Current  Outpatient Prescriptions  Medication Sig Dispense Refill  . acetaminophen (TYLENOL) 500 MG tablet Take 500 mg by mouth every 6 (six) hours as needed for pain. For pain      . albuterol (PROVENTIL HFA;VENTOLIN HFA) 108 (90 BASE) MCG/ACT inhaler Inhale 2 puffs into the lungs every 4 (four) hours as needed for wheezing or shortness of breath.  1 Inhaler  3  . allopurinol (ZYLOPRIM) 100 MG tablet Take 1 tablet (100 mg total) by mouth daily.  30 tablet  11  . aspirin EC 81 MG tablet Take 81 mg by mouth every morning.      Marland Kitchen atorvastatin (LIPITOR) 80 MG tablet Take 1 tablet (80 mg total) by mouth daily.  30 tablet  11  . calcitRIOL (ROCALTROL) 0.25 MCG capsule       . ciprofloxacin (CIPRO) 500 MG tablet Take 1 tablet (500 mg total) by mouth daily.  7 tablet  0  . docusate sodium (COLACE) 100 MG capsule Take 100 mg by mouth 2 (two) times daily as needed for constipation.       Marland Kitchen ezetimibe (ZETIA) 10 MG tablet Take 1 tablet (10 mg total) by mouth every morning.  30 tablet  11  . feeding supplement (GLUCERNA SHAKE) LIQD Take 237 mLs by mouth 2 (two) times daily.      . Fluticasone-Salmeterol (ADVAIR DISKUS) 100-50 MCG/DOSE AEPB Inhale 1 puff into the lungs 2 (two) times daily.  1 each  11  . gabapentin (NEURONTIN) 300 MG capsule Take 1 capsule (300 mg total) by mouth at bedtime.  30 capsule  11  . glucose blood (TRUETEST TEST) test strip Use as instructed  100 each  12  . hydrALAZINE (APRESOLINE) 25 MG tablet Take 1 tablet (25 mg total) by mouth 3 (three) times daily.  90 tablet  11  . HYDROcodone-acetaminophen (NORCO) 10-325 MG per tablet Take 1 tablet by mouth every 6 (six) hours as needed for pain.  50 tablet  0  . insulin NPH-regular (NOVOLIN 70/30) (70-30) 100 UNIT/ML injection 30 units with breakfast, and 20 units with the evening meal.  20 mL  11  . isosorbide mononitrate (IMDUR) 30 MG 24 hr tablet Take 1 tablet (30 mg total) by mouth daily.  30 tablet  11  . labetalol (NORMODYNE) 100 MG tablet Take  1 tablet (100 mg total) by mouth 2 (two) times daily.  60 tablet  11  . latanoprost (XALATAN) 0.005 % ophthalmic solution Place 1 drop into both eyes at bedtime.      . methocarbamol (ROBAXIN) 500 MG tablet Take 250 mg by mouth 2 (two) times daily as needed (muscle spasms).      Marland Kitchen  metolazone (ZAROXOLYN) 2.5 MG tablet Take 1 tablet (2.5 mg total) by mouth daily.  30 tablet  11  . nitroGLYCERIN (NITROSTAT) 0.4 MG SL tablet Place 0.4 mg under the tongue every 5 (five) minutes as needed for chest pain.       . pantoprazole (PROTONIX) 40 MG tablet Take 1 tablet (40 mg total) by mouth 2 (two) times daily.  60 tablet  11  . potassium chloride SA (K-DUR,KLOR-CON) 20 MEQ tablet Take 2 tablets (40 mEq total) by mouth 2 (two) times daily.  120 tablet  11  . sertraline (ZOLOFT) 100 MG tablet Take 1 tablet (100 mg total) by mouth daily.  30 tablet  11  . sorbitol 70 % solution Take 15 mLs by mouth daily as needed (constipation).       . torsemide (DEMADEX) 20 MG tablet Take 2 tablets (40 mg total) by mouth 2 (two) times daily.  40 tablet  3  . traMADol (ULTRAM) 50 MG tablet Take 1 tablet (50 mg total) by mouth every 6 (six) hours as needed for pain.  15 tablet  0  . VOLTAREN 1 % GEL Apply 2 g topically daily. Apply to knee      . zolpidem (AMBIEN) 5 MG tablet Take 2.5 mg by mouth at bedtime as needed for sleep.       No current facility-administered medications for this visit.    Allergies:    Allergies  Allergen Reactions  . Iohexol      Code: RASH, Desc: [place] ON PT'S CHART ALLERGIC TO IV DYE 09/04/07/RM, Onset Date: [id number]     Social History:  The patient  reports that she has never smoked. She has never used smokeless tobacco. She reports that she does not drink alcohol or use illicit drugs.   ROS:  Please see the history of present illness.      All other systems reviewed and negative.   PHYSICAL EXAM: VS:  BP 136/68  Pulse 60  Ht 5\' 6"  (1.676 m)  Wt 219 lb (99.338 kg)  BMI 35.36  kg/m2 Well nourished, well developed, in no acute distress HEENT: normal Neck: no JVD at 90 Cardiac:  normal S1, S2; RRR; no murmur Lungs:  clear to auscultation bilaterally, no wheezing, rhonchi or rales Abd: soft, nontender Ext: mild RLE edema at the ankles Skin: warm and dry Neuro:  CNs 2-12 intact, no focal abnormalities noted  EKG:  NSR, HR 60, inferolateral T wave changes, no significant change since prior tracing, QTc 490      ASSESSMENT AND PLAN:  1. Chronic Combined Systolic and Diastolic CHF:  Patient is currently on torsemide 20 twice a day, we'll increase metolazone to 5 mg daily. Check BMP and BNP today in followup in one month.  2. CAD:   Scar of prior nuclear stress testing with left ventricular ejection fraction 37%, cardiac cath was never performed because of her chronic kidney disease. If the patient has persistent symptoms of dyspnea and chest pain on exertion at the next visit we will order a stress test.  Continue ASA and statin.    3. Cardiomyopathy:  No ACE or ARB due to CKD.  Continue beta blocker, hydralazine, nitrates.    4. CKD:  Followed by nephrology.  5. Hypertension:  uncontrolled.  But controlled at her PCP office. We are adding metolazone and see blood pressure the next visit.   6. Hyperlipidemia:  Continue statin.  7. PAD, s/p L BKA:  F/u with [facility]  as planned.   8. COPD - on Advair - added Albuterol PRN  9. nasal congestion - we will start patient on Claritin 10 mg daily.  Disposition:  F/u in 1 month with BMP.      Dorothy Spark  11/15/2013 11:35 AM

## 2013-11-15 NOTE — Patient Instructions (Addendum)
Will obtain labs today and call you with the results (bmet/bnp)  Ok to try OTC Claritin 10 mg daily  INCREASE YOUR METALOZONE TO 5 MG DAILY (TAKE 2 OF THE 2.5 MG TABLETS, WILL SEND A NEW RX FOR 5 MG TO PHARMACY)  Your physician recommends that you schedule a follow-up appointment in: 1 MONTH OV

## 2013-11-16 ENCOUNTER — Telehealth: Payer: Self-pay | Admitting: *Deleted

## 2013-11-16 DIAGNOSIS — I10 Essential (primary) hypertension: Secondary | ICD-10-CM

## 2013-11-16 DIAGNOSIS — G4733 Obstructive sleep apnea (adult) (pediatric): Secondary | ICD-10-CM

## 2013-11-16 DIAGNOSIS — I5042 Chronic combined systolic (congestive) and diastolic (congestive) heart failure: Secondary | ICD-10-CM

## 2013-11-16 DIAGNOSIS — J449 Chronic obstructive pulmonary disease, unspecified: Secondary | ICD-10-CM

## 2013-11-16 DIAGNOSIS — N259 Disorder resulting from impaired renal tubular function, unspecified: Secondary | ICD-10-CM

## 2013-11-16 DIAGNOSIS — I2510 Atherosclerotic heart disease of native coronary artery without angina pectoris: Secondary | ICD-10-CM

## 2013-11-16 DIAGNOSIS — R0602 Shortness of breath: Secondary | ICD-10-CM

## 2013-11-16 NOTE — Telephone Encounter (Signed)
Message copied by Nuala Alpha on Tue Nov 16, 2013 12:22 PM ------      Message from: Dorothy Spark      Created: Mon Nov 15, 2013 11:03 PM       Please call her and tell her to use metolazone 5 mg daily for only 1 week, then go back to 2.5 mg po daily, she will need repeat BMP prior to the next visit.      Thank you,      K ------

## 2013-11-16 NOTE — Telephone Encounter (Signed)
Pt contacted about recent lab results and Dr Francesca Oman recommendation for pt to increase her Metolazone to 5 mg for 1 week only, then resume back to 2.5 mg po daily. Pt also made aware to come in for repeat BMP prior to her 1 month OV.  Lab appt made for 12/16/13.  1 month OV will be made by [facility] schedulers.  Will notify schedulers to call and set up this 1 month OV with Dr Meda Coffee.  Pt verbalized understanding of med dose increase and upcoming appts.

## 2013-11-19 ENCOUNTER — Ambulatory Visit: Payer: Medicare Other | Admitting: Physical Therapy

## 2013-11-22 ENCOUNTER — Ambulatory Visit: Payer: Medicare Other | Admitting: Physical Therapy

## 2013-11-24 ENCOUNTER — Ambulatory Visit: Payer: Medicare Other | Admitting: Physical Therapy

## 2013-11-29 ENCOUNTER — Ambulatory Visit: Payer: Medicare Other | Admitting: Physical Therapy

## 2013-12-01 ENCOUNTER — Ambulatory Visit: Payer: Medicare Other | Admitting: Physical Therapy

## 2013-12-05 ENCOUNTER — Observation Stay (HOSPITAL_COMMUNITY)
Admission: AD | Admit: 2013-12-05 | Discharge: 2013-12-08 | Disposition: A | Discharge status: Skilled Nursing Facility | Payer: Medicare Other | Attending: Internal Medicine | Admitting: Internal Medicine

## 2013-12-05 ENCOUNTER — Encounter (HOSPITAL_COMMUNITY): Payer: Self-pay | Admitting: Emergency Medicine

## 2013-12-05 ENCOUNTER — Emergency Department (HOSPITAL_COMMUNITY): Payer: Medicare Other

## 2013-12-05 DIAGNOSIS — I129 Hypertensive chronic kidney disease with stage 1 through stage 4 chronic kidney disease, or unspecified chronic kidney disease: Secondary | ICD-10-CM | POA: Insufficient documentation

## 2013-12-05 DIAGNOSIS — K219 Gastro-esophageal reflux disease without esophagitis: Secondary | ICD-10-CM

## 2013-12-05 DIAGNOSIS — R0989 Other specified symptoms and signs involving the circulatory and respiratory systems: Secondary | ICD-10-CM

## 2013-12-05 DIAGNOSIS — R1030 Lower abdominal pain, unspecified: Secondary | ICD-10-CM

## 2013-12-05 DIAGNOSIS — F329 Major depressive disorder, single episode, unspecified: Secondary | ICD-10-CM

## 2013-12-05 DIAGNOSIS — I152 Hypertension secondary to endocrine disorders: Secondary | ICD-10-CM

## 2013-12-05 DIAGNOSIS — R197 Diarrhea, unspecified: Secondary | ICD-10-CM

## 2013-12-05 DIAGNOSIS — R079 Chest pain, unspecified: Secondary | ICD-10-CM

## 2013-12-05 DIAGNOSIS — E1159 Type 2 diabetes mellitus with other circulatory complications: Secondary | ICD-10-CM

## 2013-12-05 DIAGNOSIS — J45909 Unspecified asthma, uncomplicated: Secondary | ICD-10-CM

## 2013-12-05 DIAGNOSIS — Z4802 Encounter for removal of sutures: Secondary | ICD-10-CM

## 2013-12-05 DIAGNOSIS — R252 Cramp and spasm: Secondary | ICD-10-CM

## 2013-12-05 DIAGNOSIS — E1149 Type 2 diabetes mellitus with other diabetic neurological complication: Secondary | ICD-10-CM | POA: Insufficient documentation

## 2013-12-05 DIAGNOSIS — Z91199 Patient's noncompliance with other medical treatment and regimen due to unspecified reason: Secondary | ICD-10-CM

## 2013-12-05 DIAGNOSIS — I2510 Atherosclerotic heart disease of native coronary artery without angina pectoris: Secondary | ICD-10-CM

## 2013-12-05 DIAGNOSIS — E559 Vitamin D deficiency, unspecified: Secondary | ICD-10-CM

## 2013-12-05 DIAGNOSIS — I509 Heart failure, unspecified: Secondary | ICD-10-CM | POA: Insufficient documentation

## 2013-12-05 DIAGNOSIS — M1990 Unspecified osteoarthritis, unspecified site: Secondary | ICD-10-CM

## 2013-12-05 DIAGNOSIS — M79605 Pain in left leg: Secondary | ICD-10-CM

## 2013-12-05 DIAGNOSIS — I998 Other disorder of circulatory system: Secondary | ICD-10-CM

## 2013-12-05 DIAGNOSIS — R809 Proteinuria, unspecified: Secondary | ICD-10-CM

## 2013-12-05 DIAGNOSIS — K279 Peptic ulcer, site unspecified, unspecified as acute or chronic, without hemorrhage or perforation: Secondary | ICD-10-CM

## 2013-12-05 DIAGNOSIS — R05 Cough: Secondary | ICD-10-CM

## 2013-12-05 DIAGNOSIS — R1319 Other dysphagia: Secondary | ICD-10-CM

## 2013-12-05 DIAGNOSIS — M810 Age-related osteoporosis without current pathological fracture: Secondary | ICD-10-CM

## 2013-12-05 DIAGNOSIS — Z9119 Patient's noncompliance with other medical treatment and regimen: Secondary | ICD-10-CM

## 2013-12-05 DIAGNOSIS — G4730 Sleep apnea, unspecified: Secondary | ICD-10-CM

## 2013-12-05 DIAGNOSIS — R059 Cough, unspecified: Secondary | ICD-10-CM

## 2013-12-05 DIAGNOSIS — M25559 Pain in unspecified hip: Secondary | ICD-10-CM

## 2013-12-05 DIAGNOSIS — T148XXA Other injury of unspecified body region, initial encounter: Secondary | ICD-10-CM

## 2013-12-05 DIAGNOSIS — X58XXXA Exposure to other specified factors, initial encounter: Secondary | ICD-10-CM | POA: Insufficient documentation

## 2013-12-05 DIAGNOSIS — I428 Other cardiomyopathies: Secondary | ICD-10-CM | POA: Insufficient documentation

## 2013-12-05 DIAGNOSIS — R109 Unspecified abdominal pain: Secondary | ICD-10-CM

## 2013-12-05 DIAGNOSIS — J449 Chronic obstructive pulmonary disease, unspecified: Secondary | ICD-10-CM

## 2013-12-05 DIAGNOSIS — E1165 Type 2 diabetes mellitus with hyperglycemia: Secondary | ICD-10-CM | POA: Insufficient documentation

## 2013-12-05 DIAGNOSIS — R609 Edema, unspecified: Secondary | ICD-10-CM

## 2013-12-05 DIAGNOSIS — I5042 Chronic combined systolic (congestive) and diastolic (congestive) heart failure: Secondary | ICD-10-CM

## 2013-12-05 DIAGNOSIS — J45901 Unspecified asthma with (acute) exacerbation: Secondary | ICD-10-CM

## 2013-12-05 DIAGNOSIS — M25552 Pain in left hip: Secondary | ICD-10-CM

## 2013-12-05 DIAGNOSIS — R358 Other polyuria: Secondary | ICD-10-CM

## 2013-12-05 DIAGNOSIS — G4733 Obstructive sleep apnea (adult) (pediatric): Secondary | ICD-10-CM

## 2013-12-05 DIAGNOSIS — Z79899 Other long term (current) drug therapy: Secondary | ICD-10-CM

## 2013-12-05 DIAGNOSIS — R3589 Other polyuria: Secondary | ICD-10-CM

## 2013-12-05 DIAGNOSIS — N259 Disorder resulting from impaired renal tubular function, unspecified: Secondary | ICD-10-CM

## 2013-12-05 DIAGNOSIS — J4489 Other specified chronic obstructive pulmonary disease: Secondary | ICD-10-CM

## 2013-12-05 DIAGNOSIS — R1031 Right lower quadrant pain: Secondary | ICD-10-CM

## 2013-12-05 DIAGNOSIS — I10 Essential (primary) hypertension: Secondary | ICD-10-CM

## 2013-12-05 DIAGNOSIS — G609 Hereditary and idiopathic neuropathy, unspecified: Secondary | ICD-10-CM

## 2013-12-05 DIAGNOSIS — F3289 Other specified depressive episodes: Secondary | ICD-10-CM

## 2013-12-05 DIAGNOSIS — N179 Acute kidney failure, unspecified: Secondary | ICD-10-CM

## 2013-12-05 DIAGNOSIS — R1011 Right upper quadrant pain: Secondary | ICD-10-CM

## 2013-12-05 DIAGNOSIS — I2583 Coronary atherosclerosis due to lipid rich plaque: Secondary | ICD-10-CM

## 2013-12-05 DIAGNOSIS — E1129 Type 2 diabetes mellitus with other diabetic kidney complication: Secondary | ICD-10-CM | POA: Insufficient documentation

## 2013-12-05 DIAGNOSIS — E11319 Type 2 diabetes mellitus with unspecified diabetic retinopathy without macular edema: Secondary | ICD-10-CM | POA: Insufficient documentation

## 2013-12-05 DIAGNOSIS — N390 Urinary tract infection, site not specified: Secondary | ICD-10-CM

## 2013-12-05 DIAGNOSIS — Z7982 Long term (current) use of aspirin: Secondary | ICD-10-CM | POA: Insufficient documentation

## 2013-12-05 DIAGNOSIS — N184 Chronic kidney disease, stage 4 (severe): Secondary | ICD-10-CM | POA: Insufficient documentation

## 2013-12-05 DIAGNOSIS — R2681 Unsteadiness on feet: Secondary | ICD-10-CM

## 2013-12-05 DIAGNOSIS — E790 Hyperuricemia without signs of inflammatory arthritis and tophaceous disease: Secondary | ICD-10-CM

## 2013-12-05 DIAGNOSIS — IMO0002 Reserved for concepts with insufficient information to code with codable children: Principal | ICD-10-CM

## 2013-12-05 DIAGNOSIS — E1139 Type 2 diabetes mellitus with other diabetic ophthalmic complication: Secondary | ICD-10-CM | POA: Insufficient documentation

## 2013-12-05 DIAGNOSIS — R634 Abnormal weight loss: Secondary | ICD-10-CM

## 2013-12-05 DIAGNOSIS — E876 Hypokalemia: Secondary | ICD-10-CM

## 2013-12-05 DIAGNOSIS — M79643 Pain in unspecified hand: Secondary | ICD-10-CM

## 2013-12-05 DIAGNOSIS — Z794 Long term (current) use of insulin: Secondary | ICD-10-CM | POA: Insufficient documentation

## 2013-12-05 DIAGNOSIS — R0602 Shortness of breath: Secondary | ICD-10-CM

## 2013-12-05 DIAGNOSIS — E785 Hyperlipidemia, unspecified: Secondary | ICD-10-CM

## 2013-12-05 DIAGNOSIS — R17 Unspecified jaundice: Secondary | ICD-10-CM

## 2013-12-05 DIAGNOSIS — I739 Peripheral vascular disease, unspecified: Secondary | ICD-10-CM

## 2013-12-05 DIAGNOSIS — S88119A Complete traumatic amputation at level between knee and ankle, unspecified lower leg, initial encounter: Secondary | ICD-10-CM

## 2013-12-05 DIAGNOSIS — R509 Fever, unspecified: Secondary | ICD-10-CM

## 2013-12-05 DIAGNOSIS — E669 Obesity, unspecified: Secondary | ICD-10-CM | POA: Insufficient documentation

## 2013-12-05 LAB — CBC
HCT: 43 % (ref 36.0–46.0)
HEMOGLOBIN: 13.5 g/dL (ref 12.0–15.0)
MCH: 26.9 pg (ref 26.0–34.0)
MCHC: 31.4 g/dL (ref 30.0–36.0)
MCV: 85.7 fL (ref 78.0–100.0)
Platelets: 294 10*3/uL (ref 150–400)
RBC: 5.02 MIL/uL (ref 3.87–5.11)
RDW: 17 % — AB (ref 11.5–15.5)
WBC: 8.3 10*3/uL (ref 4.0–10.5)

## 2013-12-05 LAB — URINALYSIS, ROUTINE W REFLEX MICROSCOPIC
Bilirubin Urine: NEGATIVE
Glucose, UA: NEGATIVE mg/dL
Ketones, ur: NEGATIVE mg/dL
Nitrite: POSITIVE — AB
PH: 5 (ref 5.0–8.0)
PROTEIN: 100 mg/dL — AB
Specific Gravity, Urine: 1.021 (ref 1.005–1.030)
Urobilinogen, UA: 0.2 mg/dL (ref 0.0–1.0)

## 2013-12-05 LAB — COMPREHENSIVE METABOLIC PANEL
ALBUMIN: 3.4 g/dL — AB (ref 3.5–5.2)
ALT: 9 U/L (ref 0–35)
AST: 21 U/L (ref 0–37)
Alkaline Phosphatase: 89 U/L (ref 39–117)
BUN: 24 mg/dL — AB (ref 6–23)
CO2: 26 mEq/L (ref 19–32)
CREATININE: 1.5 mg/dL — AB (ref 0.50–1.10)
Calcium: 9 mg/dL (ref 8.4–10.5)
Chloride: 103 mEq/L (ref 96–112)
GFR calc non Af Amer: 31 mL/min — ABNORMAL LOW (ref >90)
GFR, EST AFRICAN AMERICAN: 36 mL/min — AB (ref >90)
Glucose, Bld: 169 mg/dL — ABNORMAL HIGH (ref 70–99)
Potassium: 4.7 mEq/L (ref 3.7–5.3)
Sodium: 142 mEq/L (ref 137–147)
TOTAL PROTEIN: 6.7 g/dL (ref 6.0–8.3)
Total Bilirubin: 0.4 mg/dL (ref 0.3–1.2)

## 2013-12-05 LAB — URINE MICROSCOPIC-ADD ON

## 2013-12-05 LAB — GLUCOSE, CAPILLARY: Glucose-Capillary: 158 mg/dL — ABNORMAL HIGH (ref 70–99)

## 2013-12-05 LAB — I-STAT CG4 LACTIC ACID, ED: Lactic Acid, Venous: 1.51 mmol/L (ref 0.5–2.2)

## 2013-12-05 MED ORDER — LABETALOL HCL 100 MG PO TABS
100.0000 mg | ORAL_TABLET | Freq: Two times a day (BID) | ORAL | Status: DC
  Administered 2013-12-06 – 2013-12-08 (×6): 100 mg via ORAL
  Filled 2013-12-05 (×7): qty 1

## 2013-12-05 MED ORDER — ALBUTEROL SULFATE HFA 108 (90 BASE) MCG/ACT IN AERS: 2.0000 | INHALATION_SPRAY | RESPIRATORY_TRACT | Status: DC | PRN

## 2013-12-05 MED ORDER — MOMETASONE FURO-FORMOTEROL FUM 100-5 MCG/ACT IN AERO
2.0000 | INHALATION_SPRAY | Freq: Two times a day (BID) | RESPIRATORY_TRACT | Status: DC
  Administered 2013-12-06 – 2013-12-08 (×5): 2 via RESPIRATORY_TRACT
  Filled 2013-12-05: qty 8.8

## 2013-12-05 MED ORDER — ASPIRIN 325 MG PO TABS
325.0000 mg | ORAL_TABLET | Freq: Every day | ORAL | Status: DC
  Administered 2013-12-06 – 2013-12-08 (×3): 325 mg via ORAL
  Filled 2013-12-05 (×3): qty 1

## 2013-12-05 MED ORDER — MORPHINE SULFATE 2 MG/ML IJ SOLN: 0.5000 mg | INTRAMUSCULAR | Status: DC | PRN

## 2013-12-05 MED ORDER — DEXTROSE 5 % IV SOLN
1.0000 g | INTRAVENOUS | Status: DC
  Administered 2013-12-06 – 2013-12-07 (×2): 1 g via INTRAVENOUS
  Filled 2013-12-05 (×2): qty 10

## 2013-12-05 MED ORDER — DOCUSATE SODIUM 100 MG PO CAPS
100.0000 mg | ORAL_CAPSULE | Freq: Two times a day (BID) | ORAL | Status: DC
  Administered 2013-12-06 – 2013-12-08 (×6): 100 mg via ORAL
  Filled 2013-12-05 (×7): qty 1

## 2013-12-05 MED ORDER — EZETIMIBE 10 MG PO TABS
10.0000 mg | ORAL_TABLET | Freq: Every morning | ORAL | Status: DC
  Administered 2013-12-06 – 2013-12-08 (×3): 10 mg via ORAL
  Filled 2013-12-05 (×3): qty 1

## 2013-12-05 MED ORDER — ALLOPURINOL 100 MG PO TABS
100.0000 mg | ORAL_TABLET | Freq: Every day | ORAL | Status: DC
  Administered 2013-12-06 – 2013-12-08 (×3): 100 mg via ORAL
  Filled 2013-12-05 (×3): qty 1

## 2013-12-05 MED ORDER — SODIUM CHLORIDE 0.9 % IV BOLUS (SEPSIS): 500.0000 mL | Freq: Once | INTRAVENOUS | Status: DC

## 2013-12-05 MED ORDER — GABAPENTIN 300 MG PO CAPS
300.0000 mg | ORAL_CAPSULE | Freq: Every day | ORAL | Status: DC
  Administered 2013-12-06 – 2013-12-07 (×3): 300 mg via ORAL
  Filled 2013-12-05 (×4): qty 1

## 2013-12-05 MED ORDER — METHOCARBAMOL 500 MG PO TABS: 250.0000 mg | ORAL_TABLET | Freq: Two times a day (BID) | ORAL | Status: DC | PRN

## 2013-12-05 MED ORDER — DEXTROSE 5 % IV SOLN
1.0000 g | Freq: Once | INTRAVENOUS | Status: AC
  Administered 2013-12-05: 1 g via INTRAVENOUS
  Filled 2013-12-05: qty 10

## 2013-12-05 MED ORDER — OXYCODONE-ACETAMINOPHEN 5-325 MG PO TABS
1.0000 | ORAL_TABLET | ORAL | Status: DC | PRN
  Administered 2013-12-06 – 2013-12-08 (×7): 1 via ORAL
  Filled 2013-12-05 (×7): qty 1

## 2013-12-05 MED ORDER — HYDRALAZINE HCL 25 MG PO TABS
25.0000 mg | ORAL_TABLET | Freq: Three times a day (TID) | ORAL | Status: DC
  Administered 2013-12-06 – 2013-12-08 (×9): 25 mg via ORAL
  Filled 2013-12-05 (×10): qty 1

## 2013-12-05 MED ORDER — POLYETHYLENE GLYCOL 3350 17 G PO PACK
17.0000 g | PACK | Freq: Every day | ORAL | Status: DC | PRN
  Filled 2013-12-05: qty 1

## 2013-12-05 MED ORDER — PANTOPRAZOLE SODIUM 40 MG PO TBEC
40.0000 mg | DELAYED_RELEASE_TABLET | Freq: Two times a day (BID) | ORAL | Status: DC
  Administered 2013-12-06 – 2013-12-08 (×6): 40 mg via ORAL
  Filled 2013-12-05 (×6): qty 1

## 2013-12-05 MED ORDER — ISOSORBIDE MONONITRATE ER 30 MG PO TB24
30.0000 mg | ORAL_TABLET | Freq: Every day | ORAL | Status: DC
  Administered 2013-12-06 – 2013-12-08 (×3): 30 mg via ORAL
  Filled 2013-12-05 (×3): qty 1

## 2013-12-05 MED ORDER — TORSEMIDE 20 MG PO TABS
40.0000 mg | ORAL_TABLET | Freq: Every day | ORAL | Status: DC
  Administered 2013-12-06 – 2013-12-08 (×3): 40 mg via ORAL
  Filled 2013-12-05 (×3): qty 2

## 2013-12-05 MED ORDER — METOLAZONE 2.5 MG PO TABS
2.5000 mg | ORAL_TABLET | Freq: Two times a day (BID) | ORAL | Status: DC
  Administered 2013-12-06 – 2013-12-08 (×6): 2.5 mg via ORAL
  Filled 2013-12-05 (×7): qty 1

## 2013-12-05 MED ORDER — HEPARIN SODIUM (PORCINE) 5000 UNIT/ML IJ SOLN
5000.0000 [IU] | Freq: Three times a day (TID) | INTRAMUSCULAR | Status: DC
  Administered 2013-12-06 – 2013-12-08 (×8): 5000 [IU] via SUBCUTANEOUS
  Filled 2013-12-05 (×10): qty 1

## 2013-12-05 MED ORDER — SERTRALINE HCL 100 MG PO TABS
100.0000 mg | ORAL_TABLET | Freq: Every day | ORAL | Status: DC
  Administered 2013-12-06 – 2013-12-08 (×3): 100 mg via ORAL
  Filled 2013-12-05 (×3): qty 1

## 2013-12-05 NOTE — Progress Notes (Signed)
ANTIBIOTIC CONSULT NOTE - INITIAL  Pharmacy Consult for Rocephin Indication: UTI  Allergies  Allergen Reactions  . Iohexol      Code: RASH, Desc: [place] ON PT'S CHART ALLERGIC TO IV DYE 09/04/07/RM, Onset Date: [id number]     Patient Measurements: Height: 5\' 6"  (167.6 cm) Weight: 215 lb 2.7 oz (97.6 kg) IBW/kg (Calculated) : 59.3  Vital Signs: Temp: 99.5 F (37.5 C) (06/21 2342) Temp src: Oral (06/21 2342) BP: 191/85 mmHg (06/21 2342) Pulse Rate: 77 (06/21 2342) Intake/Output from previous day:   Intake/Output from this shift:    Labs:  Recent Labs  12/05/13 1853  WBC 8.3  HGB 13.5  PLT 294  CREATININE 1.50*   Estimated Creatinine Clearance: 34.1 ml/min (by C-G formula based on Cr of 1.5). No results found for this basename: VANCOTROUGH, VANCOPEAK, VANCORANDOM, GENTTROUGH, GENTPEAK, GENTRANDOM, TOBRATROUGH, TOBRAPEAK, TOBRARND, AMIKACINPEAK, AMIKACINTROU, AMIKACIN,  in the last 72 hours   Microbiology: No results found for this or any previous visit (from the past 720 hour(s)).  Medical History: Past Medical History  Diagnosis Date  . Uterine cancer   . Diabetes mellitus     type II; peripheral neuropathy  . Hypertension   . GERD (gastroesophageal reflux disease)   . Peripheral vascular disease     s/p L BKA 08/2012  . Obesity   . Dyslipidemia   . Diastolic CHF, chronic     EF 50-55%, mild LVH and grade 1 diast. Dysfxn  . Osteoarthritis cervical spine   . DM retinopathy   . Sleep apnea     with CPAP  . History of shingles   . COPD (chronic obstructive pulmonary disease)   . Depression   . Peptic ulcer disease     duodenal  . Peptic stricture of esophagus   . Hiatal hernia   . Diverticulosis   . Arthritis   . ASCVD (arteriosclerotic cardiovascular disease)     on MRI brin  . CAD (coronary artery disease)     a. s/p multiple caths with nonobs CAD;   b. cath 1/10: pLAD 20%, mLAD 40%, pCFX 20%, mCFX 40%, pRCA 60-70%;   c.  Myoview 06/02/12:  Low anterior wall scar, no ischemia, EF 37%  . Cardiomyopathy     a. Echo 05/31/12: Severe LVH, EF 40-45%, diffuse HK, grade 1 diastolic dysfunction, mild to moderate calcified aortic valve annulus, mild to moderate aortic stenosis, MAC, mild MR, moderate LAE  . Asthma     Mild  . Pruritic condition     Idiopathic  . Hyperlipidemia   . Zoster   . Noncompliance   . CKD (chronic kidney disease)     Dr. Lorrene Reid    Medications:  Scheduled:  . [START ON 12/06/2013] allopurinol  100 mg Oral Daily  . [START ON 12/06/2013] aspirin  325 mg Oral Daily  . [START ON 12/06/2013] cefTRIAXone (ROCEPHIN)  IV  1 g Intravenous Q24H  . docusate sodium  100 mg Oral BID  . [START ON 12/06/2013] ezetimibe  10 mg Oral q morning - 10a  . gabapentin  300 mg Oral QHS  . heparin  5,000 Units Subcutaneous 3 times per day  . hydrALAZINE  25 mg Oral TID  . [START ON 12/06/2013] isosorbide mononitrate  30 mg Oral Daily  . labetalol  100 mg Oral BID  . metolazone  2.5 mg Oral UD  . mometasone-formoterol  2 puff Inhalation BID  . pantoprazole  40 mg Oral BID  . [START ON 12/06/2013]  sertraline  100 mg Oral Daily  . [START ON 12/06/2013] torsemide  40 mg Oral Daily   Infusions:   Assessment:  80 yr female with history of kidney infections reports left flank pain and some dysuria.  UA shows UTI.  Pharmacy requested to dose Rocephin for UTI  Goal of Therapy:  Eradication of infection  Plan:   Rocephin 1gm IV q24h for UTI  Pharmacy will sign off as no further adjustment of dosage needed.  Please consult if further assistance needed.  Thank you, [place], Toribio Harbour, PharmD 12/05/2013,11:56 PM

## 2013-12-05 NOTE — H&P (Signed)
[facility] History and Physical  Patient: [name]  MBW:[id number]  DOB: 02-27-1930  DOS: the patient was seen and examined on 12/05/2013 PCP: Renato Shin, MD  Chief Complaint: Left hip pain  HPI: [name] is a 80 y.o. female with Past medical history of diabetes mellitus, hypertension, GERD, peripheral vascular disease, left BKA, diastolic dysfunction, obstructive sleep apnea, COPD. Patient presented with complaints of left hip pain. She mentions that she has been at her baseline until Thursday. At her baseline she wears a prosthesis to help her walk without any assistance in the home. She goes to physical therapy twice a week and last physical therapy was on Wednesday. On Thursday she was asymptomatic. Friday she started having, cramping sensation in her left thigh, it progressed to severe cramping on Saturday. Sunday she was unable to walk or move her hip without severe pain. There was no fall no trauma no injury reported. She denies any sudden jerking activity. No recent change in her medication other than Zaroxolyn. She was recommended to take 5 mg Zaroxolyn for one week and then go back to 2.5 mg but she still takes 5 mg of Zaroxolyn. It is also unclear whether she takes torsemide, 81 mg aspirin, atorvastatin, and Ambien. He is using 10-15 units of NPH depending on her blood glucose twice a day. Husband and daughter are the primary caregiver for the patient and did mention that cannot take care of the patient as she has severe pain with any movement.  The patient is coming from home. And at her baseline independent for most of her ADL.  Review of Systems: as mentioned in the history of present illness.  A Comprehensive review of the other systems is negative.  Past Medical History  Diagnosis Date  . Uterine cancer   . Diabetes mellitus     type II; peripheral neuropathy  . Hypertension   . GERD (gastroesophageal reflux disease)   . Peripheral  vascular disease     s/p L BKA 08/2012  . Obesity   . Dyslipidemia   . Diastolic CHF, chronic     EF 50-55%, mild LVH and grade 1 diast. Dysfxn  . Osteoarthritis cervical spine   . DM retinopathy   . Sleep apnea     with CPAP  . History of shingles   . COPD (chronic obstructive pulmonary disease)   . Depression   . Peptic ulcer disease     duodenal  . Peptic stricture of esophagus   . Hiatal hernia   . Diverticulosis   . Arthritis   . ASCVD (arteriosclerotic cardiovascular disease)     on MRI brin  . CAD (coronary artery disease)     a. s/p multiple caths with nonobs CAD;   b. cath 1/10: pLAD 20%, mLAD 40%, pCFX 20%, mCFX 40%, pRCA 60-70%;   c.  Myoview 06/02/12: Low anterior wall scar, no ischemia, EF 37%  . Cardiomyopathy     a. Echo 05/31/12: Severe LVH, EF 40-45%, diffuse HK, grade 1 diastolic dysfunction, mild to moderate calcified aortic valve annulus, mild to moderate aortic stenosis, MAC, mild MR, moderate LAE  . Asthma     Mild  . Pruritic condition     Idiopathic  . Hyperlipidemia   . Zoster   . Noncompliance   . CKD (chronic kidney disease)     Dr. Lorrene Reid   Past Surgical History  Procedure Laterality Date  . Tubal ligation  1967  . Knee arthroscopy  10/1998  Left  . Craniotomy  1997    Left for SDH  . Cataract extraction, bilateral  2005  . Hernia repair    . Esophagogastroduodenoscopy  04/04/2004  . Spine surgery      C-spine and lumbar surgery  . Cholecystectomy  2010  . Cardiac catheterization    . Dexa  7/05  . Abdominal hysterectomy    . Amputation Left 09/04/2012    Procedure: AMPUTATION BELOW KNEE;  Surgeon: Angelia Mould, MD;  Location: [facility];  Service: Vascular;  Laterality: Left;   Social History:  reports that she has never smoked. She has never used smokeless tobacco. She reports that she does not drink alcohol or use illicit drugs.  Allergies  Allergen Reactions  . Iohexol      Code: RASH, Desc: [place] ON PT'S CHART  ALLERGIC TO IV DYE 09/04/07/RM, Onset Date: [id number]     Family History  Problem Relation Age of Onset  . Cancer Mother     "Stomach" Cancer  . Diabetes Mother   . Heart disease Mother   . Stomach cancer Mother   . Hypertension Mother   . Lymphoma Father   . Hypertension Father   . Kidney disease Paternal Grandmother   . Asthma Other   . Diabetes Sister     Prior to Admission medications   Medication Sig Start Date End Date Taking? Authorizing Provider  aspirin 325 MG tablet Take 325 mg by mouth daily.   Yes Historical Provider, MD  acetaminophen (TYLENOL) 500 MG tablet Take 500 mg by mouth every 6 (six) hours as needed for pain. For pain    Historical Provider, MD  albuterol (PROVENTIL HFA;VENTOLIN HFA) 108 (90 BASE) MCG/ACT inhaler Inhale 2 puffs into the lungs every 4 (four) hours as needed for wheezing or shortness of breath. 08/05/13   Dorothy Spark, MD  allopurinol (ZYLOPRIM) 100 MG tablet Take 1 tablet (100 mg total) by mouth daily. 04/09/13   Renato Shin, MD  aspirin EC 81 MG tablet Take 81 mg by mouth every morning.    Historical Provider, MD  calcitRIOL (ROCALTROL) 0.25 MCG capsule  03/13/13   Historical Provider, MD  docusate sodium (COLACE) 100 MG capsule Take 100 mg by mouth 2 (two) times daily as needed for constipation.     Historical Provider, MD  ezetimibe (ZETIA) 10 MG tablet Take 1 tablet (10 mg total) by mouth every morning. 04/09/13   Renato Shin, MD  feeding supplement (GLUCERNA SHAKE) LIQD Take 237 mLs by mouth 2 (two) times daily.    Historical Provider, MD  Fluticasone-Salmeterol (ADVAIR DISKUS) 100-50 MCG/DOSE AEPB Inhale 1 puff into the lungs 2 (two) times daily. 07/12/13   Renato Shin, MD  gabapentin (NEURONTIN) 300 MG capsule Take 1 capsule (300 mg total) by mouth at bedtime. 04/09/13   Renato Shin, MD  glucose blood (TRUETEST TEST) test strip Use as instructed 02/08/13   Renato Shin, MD  hydrALAZINE (APRESOLINE) 25 MG tablet Take 1 tablet (25 mg  total) by mouth 3 (three) times daily. 04/09/13   Renato Shin, MD  HYDROcodone-acetaminophen (NORCO) 10-325 MG per tablet Take 1 tablet by mouth every 6 (six) hours as needed for pain. 04/09/13   Renato Shin, MD  insulin NPH-regular (NOVOLIN 70/30) (70-30) 100 UNIT/ML injection 30 units with breakfast, and 20 units with the evening meal. 04/09/13   Renato Shin, MD  isosorbide mononitrate (IMDUR) 30 MG 24 hr tablet Take 1 tablet (30 mg total) by mouth daily. 04/09/13  Renato Shin, MD  labetalol (NORMODYNE) 100 MG tablet Take 1 tablet (100 mg total) by mouth 2 (two) times daily. 04/09/13   Renato Shin, MD  latanoprost (XALATAN) 0.005 % ophthalmic solution Place 1 drop into both eyes at bedtime.    Historical Provider, MD  loratadine (CLARITIN) 10 MG tablet Take 10 mg by mouth daily.    Historical Provider, MD  methocarbamol (ROBAXIN) 500 MG tablet Take 250 mg by mouth 2 (two) times daily as needed (muscle spasms).    Historical Provider, MD  metolazone (ZAROXOLYN) 2.5 MG tablet Take 2.5 mg by mouth as directed. 2 daily 07/23/13   Dorothy Spark, MD  nitroGLYCERIN (NITROSTAT) 0.4 MG SL tablet Place 0.4 mg under the tongue every 5 (five) minutes as needed for chest pain.     Historical Provider, MD  pantoprazole (PROTONIX) 40 MG tablet Take 1 tablet (40 mg total) by mouth 2 (two) times daily. 04/09/13   Renato Shin, MD  potassium chloride SA (K-DUR,KLOR-CON) 20 MEQ tablet Take 2 tablets (40 mEq total) by mouth 2 (two) times daily. 04/09/13   Renato Shin, MD  sertraline (ZOLOFT) 100 MG tablet Take 1 tablet (100 mg total) by mouth daily. 04/09/13   Renato Shin, MD  sorbitol 70 % solution Take 15 mLs by mouth daily as needed (constipation).     Historical Provider, MD  torsemide (DEMADEX) 20 MG tablet Take 2 tablets (40 mg total) by mouth 2 (two) times daily. 02/01/13   Renato Shin, MD  traMADol (ULTRAM) 50 MG tablet Take 1 tablet (50 mg total) by mouth every 6 (six) hours as needed for pain.  01/04/13   Kaitlyn Szekalski, PA-C  VOLTAREN 1 % GEL Apply 2 g topically daily. Apply to knee 08/28/12   Historical Provider, MD    Physical Exam: Filed Vitals:   12/05/13 1808 12/05/13 2010 12/05/13 2321  BP: 141/52 157/60 192/84  Pulse: 70 69 86  Temp: 98.5 F (36.9 C) 98.8 F (37.1 C)   TempSrc: Oral Oral   Resp: 20 17 18   SpO2: 92% 92% 95%    General: Alert, Awake and Oriented to Time, Place and Person. Appear in mild distress Eyes: PERRL ENT: Oral Mucosa clear dry. Neck: No JVD Cardiovascular: S1 and S2 Present, no Murmur, Peripheral Pulses Present Respiratory: Bilateral Air entry equal and Decreased, Clear to Auscultation,  No Crackles, no wheezes Abdomen: Bowel Sound Present, Soft and Non tender Skin: No Rash Extremities: Trace Pedal edema, no calf tenderness Limited range of motion bilaterally at knee due to pain. Limited range of motion on left hip joint with tenderness at the lateral aspect and gluteal aspect. No tenderness on the right hip joint. Sensations intact.  Neurologic: Grossly no focal neuro deficit. Labs on Admission:  CBC:  Recent Labs Lab 12/05/13 1853  WBC 8.3  HGB 13.5  HCT 43.0  MCV 85.7  PLT 294    CMP     Component Value Date/Time   NA 142 12/05/2013 1853   K 4.7 12/05/2013 1853   CL 103 12/05/2013 1853   CO2 26 12/05/2013 1853   GLUCOSE 169* 12/05/2013 1853   BUN 24* 12/05/2013 1853   CREATININE 1.50* 12/05/2013 1853   CALCIUM 9.0 12/05/2013 1853   CALCIUM 9.8 02/20/2012 1018   PROT 6.7 12/05/2013 1853   ALBUMIN 3.4* 12/05/2013 1853   AST 21 12/05/2013 1853   ALT 9 12/05/2013 1853   ALKPHOS 89 12/05/2013 1853   BILITOT 0.4 12/05/2013 1853   GFRNONAA  31* 12/05/2013 1853   GFRAA 36* 12/05/2013 1853    No results found for this basename: LIPASE, AMYLASE,  in the last 168 hours No results found for this basename: AMMONIA,  in the last 168 hours  No results found for this basename: CKTOTAL, CKMB, CKMBINDEX, TROPONINI,  in the last 168  hours BNP (last 3 results)  Recent Labs  07/12/13 1639 08/05/13 0936 11/15/13 1205  PROBNP 465.0* 400.0* 158.0*    Radiological Exams on Admission: Ct Abdomen Pelvis Wo Contrast  12/05/2013   CLINICAL DATA:  Left-sided abdominal pain extending into the left lower extremity. Left flank pain.  EXAM: CT ABDOMEN AND PELVIS WITHOUT CONTRAST  TECHNIQUE: Multidetector CT imaging of the abdomen and pelvis was performed following the standard protocol without IV contrast.  COMPARISON:  05/30/2012  FINDINGS: Mild atelectasis, right middle lobe.  Calcified mitral valve.  Small hiatal hernia, type 1.  Prior cholecystectomy.  The noncontrast CT appearance of the liver, spleen, pancreas, and adrenal glands is within normal limits. No hydronephrosis, renal, or ureteral calculi are identified. There are numerous vascular calcifications in the pelvis is. No bladder stone identified.  Aortoiliac atherosclerotic vascular disease. Vascular calcifications along the margins of the uterus noted.  Acetabular protrusio is present with considerable bony demineralization. Old right iliac crest deformity. levoconvex lumbar scoliosis. Grade 1 anterolisthesis at L3-4 and L4-5 noted. Multilevel facet arthropathy, probable foraminal narrowing on the right at L3-4 and L4-5 due to disc protrusions and facet arthropathy.  Abnormal thickening of the left iliacus and psoas muscle along the inferior pelvis and tracking into the upper leg, with expansion noted. There is a small distal calcification along this process near the lesser trochanter.  IMPRESSION: 1. Suspected left iliopsoas avulsion, with a small linear calcification along the suspected avulsion site from the left lesser trochanter, and with abnormal thickening favoring hematoma within the distal iliopsoas. Alternatively this could represent spontaneous intramuscular hemorrhage in the setting of calcific tendinopathy rather than avulsion. 2. Numerous ancillary findings include  lumbar spondylosis and degenerative disc disease causing foraminal impingement ; atherosclerosis; a small hiatal hernia ; minimal right middle lobe atelectasis; calcified mitral valve; protrusio acetabuli; and pelvic deformity due to old fractures.   Electronically Signed   By: Sherryl Barters M.D.   On: 12/05/2013 20:10     Assessment/Plan Principal Problem:   Left hip pain Active Problems:   DM (diabetes mellitus), type 2, uncontrolled, with renal complications   OBSTRUCTIVE SLEEP APNEA   HYPERTENSION   CORONARY ARTERY DISEASE   Chronic combined systolic and diastolic heart failure   COPD   GERD   Lower limb amputation, below knee   UTI (urinary tract infection)   1. Left hip pain Iliopsoas hematoma and Avulsion Patient presented with complaints of left hip pain. She is unable to move without pain. CT of the abdomen was performed which is showing avulsion with hematoma of the iliopsoas muscle. Orthopedic has been consulted who recommended conservative management. At present we would admit the patient to the hospital for pain management, physical therapy occupational therapy observation. If her serum creatinine remains stable tomorrow anti-inflammatory medication should be added. Currently IV morphine and Percocet for pain management. Zofran as needed for nausea. PTOT consultation. Bedrest.  2. Diabetes mellitus Placing the patient on sliding scale  3. UTI Ceftriaxone  4. Diastolic dysfunction Continue Zaroxolyn 2.5 mg, at present continuing torsemide 40 mg daily. Recommended family to bring all her medications to verify.  Consults: PTOT  DVT Prophylaxis: subcutaneous  Heparin Nutrition: Diabetic and cardiac  Code Status: Full  Family Communication: Family was present at bedside, opportunity was given to ask question and all questions were answered satisfactorily at the time of interview. Disposition: Admitted to observation in med-surge unit.  Author: Berle Mull,  MD [facility] Pager: [phone] 12/05/2013, 11:35 PM    If 7PM-7AM, please contact night-coverage [email or web address] Password [email or web address]

## 2013-12-05 NOTE — ED Notes (Signed)
Per [facility]-pt from home with  c/o of left sided pain from abd down to left knee. Pain 10/10 constant.

## 2013-12-05 NOTE — ED Notes (Signed)
Bed: WA17 Expected date:  Expected time:  Means of arrival:  Comments: 

## 2013-12-05 NOTE — ED Provider Notes (Signed)
CSN: [id number]     Arrival date & time 12/05/13  1806 History   First MD Initiated Contact with Patient 12/05/13 1808     Chief Complaint  Patient presents with  . Hip Pain     (Consider location/radiation/quality/duration/timing/severity/associated sxs/prior Treatment) Patient is a 80 y.o. female presenting with flank pain. The history is provided by the patient.  Flank Pain This is a new problem. The current episode started 2 days ago. The problem occurs constantly. The problem has not changed since onset.Associated symptoms include abdominal pain. Pertinent negatives include no chest pain and no shortness of breath. Nothing aggravates the symptoms. Nothing relieves the symptoms. She has tried nothing for the symptoms.    Past Medical History  Diagnosis Date  . Uterine cancer   . Diabetes mellitus     type II; peripheral neuropathy  . Hypertension   . GERD (gastroesophageal reflux disease)   . Peripheral vascular disease     s/p L BKA 08/2012  . Obesity   . Dyslipidemia   . Diastolic CHF, chronic     EF 50-55%, mild LVH and grade 1 diast. Dysfxn  . Osteoarthritis cervical spine   . DM retinopathy   . Sleep apnea     with CPAP  . History of shingles   . COPD (chronic obstructive pulmonary disease)   . Depression   . Peptic ulcer disease     duodenal  . Peptic stricture of esophagus   . Hiatal hernia   . Diverticulosis   . Arthritis   . ASCVD (arteriosclerotic cardiovascular disease)     on MRI brin  . CAD (coronary artery disease)     a. s/p multiple caths with nonobs CAD;   b. cath 1/10: pLAD 20%, mLAD 40%, pCFX 20%, mCFX 40%, pRCA 60-70%;   c.  Myoview 06/02/12: Low anterior wall scar, no ischemia, EF 37%  . Cardiomyopathy     a. Echo 05/31/12: Severe LVH, EF 40-45%, diffuse HK, grade 1 diastolic dysfunction, mild to moderate calcified aortic valve annulus, mild to moderate aortic stenosis, MAC, mild MR, moderate LAE  . Asthma     Mild  . Pruritic condition    Idiopathic  . Hyperlipidemia   . Zoster   . Noncompliance   . CKD (chronic kidney disease)     Dr. Lorrene Reid   Past Surgical History  Procedure Laterality Date  . Tubal ligation  1967  . Knee arthroscopy  10/1998    Left  . Craniotomy  1997    Left for SDH  . Cataract extraction, bilateral  2005  . Hernia repair    . Esophagogastroduodenoscopy  04/04/2004  . Spine surgery      C-spine and lumbar surgery  . Cholecystectomy  2010  . Cardiac catheterization    . Dexa  7/05  . Abdominal hysterectomy    . Amputation Left 09/04/2012    Procedure: AMPUTATION BELOW KNEE;  Surgeon: Angelia Mould, MD;  Location: [facility] OR;  Service: Vascular;  Laterality: Left;   Family History  Problem Relation Age of Onset  . Cancer Mother     "Stomach" Cancer  . Diabetes Mother   . Heart disease Mother   . Stomach cancer Mother   . Hypertension Mother   . Lymphoma Father   . Hypertension Father   . Kidney disease Paternal Grandmother   . Asthma Other   . Diabetes Sister    History  Substance Use Topics  . Smoking status: Never Smoker   .  Smokeless tobacco: Never Used  . Alcohol Use: No     Comment: rare   OB History   Grav Para Term Preterm Abortions TAB SAB Ect Mult Living                 Review of Systems  Constitutional: Negative for fever and chills.  Respiratory: Negative for cough and shortness of breath.   Cardiovascular: Negative for chest pain.  Gastrointestinal: Positive for abdominal pain. Negative for nausea, vomiting and diarrhea.  Genitourinary: Positive for dysuria and flank pain.  All other systems reviewed and are negative.     Allergies  Iohexol  Home Medications   Prior to Admission medications   Medication Sig Start Date End Date Taking? Authorizing Provider  acetaminophen (TYLENOL) 500 MG tablet Take 500 mg by mouth every 6 (six) hours as needed for pain. For pain    Historical Provider, MD  albuterol (PROVENTIL HFA;VENTOLIN HFA) 108 (90 BASE)  MCG/ACT inhaler Inhale 2 puffs into the lungs every 4 (four) hours as needed for wheezing or shortness of breath. 08/05/13   Dorothy Spark, MD  allopurinol (ZYLOPRIM) 100 MG tablet Take 1 tablet (100 mg total) by mouth daily. 04/09/13   Renato Shin, MD  aspirin EC 81 MG tablet Take 81 mg by mouth every morning.    Historical Provider, MD  atorvastatin (LIPITOR) 80 MG tablet Take 1 tablet (80 mg total) by mouth daily. 04/28/13   Renato Shin, MD  calcitRIOL (ROCALTROL) 0.25 MCG capsule  03/13/13   Historical Provider, MD  ciprofloxacin (CIPRO) 500 MG tablet Take 1 tablet (500 mg total) by mouth daily. 07/13/13   Renato Shin, MD  docusate sodium (COLACE) 100 MG capsule Take 100 mg by mouth 2 (two) times daily as needed for constipation.     Historical Provider, MD  ezetimibe (ZETIA) 10 MG tablet Take 1 tablet (10 mg total) by mouth every morning. 04/09/13   Renato Shin, MD  feeding supplement (GLUCERNA SHAKE) LIQD Take 237 mLs by mouth 2 (two) times daily.    Historical Provider, MD  Fluticasone-Salmeterol (ADVAIR DISKUS) 100-50 MCG/DOSE AEPB Inhale 1 puff into the lungs 2 (two) times daily. 07/12/13   Renato Shin, MD  gabapentin (NEURONTIN) 300 MG capsule Take 1 capsule (300 mg total) by mouth at bedtime. 04/09/13   Renato Shin, MD  glucose blood (TRUETEST TEST) test strip Use as instructed 02/08/13   Renato Shin, MD  hydrALAZINE (APRESOLINE) 25 MG tablet Take 1 tablet (25 mg total) by mouth 3 (three) times daily. 04/09/13   Renato Shin, MD  HYDROcodone-acetaminophen (NORCO) 10-325 MG per tablet Take 1 tablet by mouth every 6 (six) hours as needed for pain. 04/09/13   Renato Shin, MD  insulin NPH-regular (NOVOLIN 70/30) (70-30) 100 UNIT/ML injection 30 units with breakfast, and 20 units with the evening meal. 04/09/13   Renato Shin, MD  isosorbide mononitrate (IMDUR) 30 MG 24 hr tablet Take 1 tablet (30 mg total) by mouth daily. 04/09/13   Renato Shin, MD  labetalol (NORMODYNE) 100 MG tablet  Take 1 tablet (100 mg total) by mouth 2 (two) times daily. 04/09/13   Renato Shin, MD  latanoprost (XALATAN) 0.005 % ophthalmic solution Place 1 drop into both eyes at bedtime.    Historical Provider, MD  loratadine (CLARITIN) 10 MG tablet Take 10 mg by mouth daily.    Historical Provider, MD  methocarbamol (ROBAXIN) 500 MG tablet Take 250 mg by mouth 2 (two) times daily as needed (muscle spasms).  Historical Provider, MD  metolazone (ZAROXOLYN) 2.5 MG tablet Take 2.5 mg by mouth as directed. 2 daily 07/23/13   Dorothy Spark, MD  nitroGLYCERIN (NITROSTAT) 0.4 MG SL tablet Place 0.4 mg under the tongue every 5 (five) minutes as needed for chest pain.     Historical Provider, MD  pantoprazole (PROTONIX) 40 MG tablet Take 1 tablet (40 mg total) by mouth 2 (two) times daily. 04/09/13   Renato Shin, MD  potassium chloride SA (K-DUR,KLOR-CON) 20 MEQ tablet Take 2 tablets (40 mEq total) by mouth 2 (two) times daily. 04/09/13   Renato Shin, MD  sertraline (ZOLOFT) 100 MG tablet Take 1 tablet (100 mg total) by mouth daily. 04/09/13   Renato Shin, MD  sorbitol 70 % solution Take 15 mLs by mouth daily as needed (constipation).     Historical Provider, MD  torsemide (DEMADEX) 20 MG tablet Take 2 tablets (40 mg total) by mouth 2 (two) times daily. 02/01/13   Renato Shin, MD  traMADol (ULTRAM) 50 MG tablet Take 1 tablet (50 mg total) by mouth every 6 (six) hours as needed for pain. 01/04/13   Kaitlyn Szekalski, PA-C  VOLTAREN 1 % GEL Apply 2 g topically daily. Apply to knee 08/28/12   Historical Provider, MD  zolpidem (AMBIEN) 5 MG tablet Take 2.5 mg by mouth at bedtime as needed for sleep.    Historical Provider, MD   BP 141/52  Pulse 70  Temp(Src) 98.5 F (36.9 C) (Oral)  Resp 20  SpO2 92% Physical Exam  Nursing note and vitals reviewed. Constitutional: She is oriented to person, place, and time. She appears well-developed and well-nourished. No distress.  HENT:  Head: Normocephalic and  atraumatic.  Mouth/Throat: Oropharynx is clear and moist.  Eyes: EOM are normal. Pupils are equal, round, and reactive to light.  Neck: Normal range of motion. Neck supple.  Cardiovascular: Normal rate and regular rhythm.  Exam reveals no friction rub.   No murmur heard. Pulmonary/Chest: Effort normal and breath sounds normal. No respiratory distress. She has no wheezes. She has no rales.  Abdominal: Soft. She exhibits no distension. There is tenderness (L flank, mild L CVA. No LLQ pain, no RLQ pain). There is no rebound.  Musculoskeletal: Normal range of motion. She exhibits no edema.  L BKA  Neurological: She is alert and oriented to person, place, and time.  Skin: She is not diaphoretic.    ED Course  Procedures (including critical care time) Labs Review Labs Reviewed  CBC - Abnormal; Notable for the following:    RDW 17.0 (*)    All other components within normal limits  COMPREHENSIVE METABOLIC PANEL - Abnormal; Notable for the following:    Glucose, Bld 169 (*)    BUN 24 (*)    Creatinine, Ser 1.50 (*)    Albumin 3.4 (*)    GFR calc non Af Amer 31 (*)    GFR calc Af Amer 36 (*)    All other components within normal limits  URINALYSIS, ROUTINE W REFLEX MICROSCOPIC - Abnormal; Notable for the following:    APPearance TURBID (*)    Hgb urine dipstick TRACE (*)    Protein, ur 100 (*)    Nitrite POSITIVE (*)    Leukocytes, UA LARGE (*)    All other components within normal limits  URINE MICROSCOPIC-ADD ON - Abnormal; Notable for the following:    Bacteria, UA MANY (*)    All other components within normal limits  I-STAT CG4 LACTIC ACID,  ED    Imaging Review Ct Abdomen Pelvis Wo Contrast  12/05/2013   CLINICAL DATA:  Left-sided abdominal pain extending into the left lower extremity. Left flank pain.  EXAM: CT ABDOMEN AND PELVIS WITHOUT CONTRAST  TECHNIQUE: Multidetector CT imaging of the abdomen and pelvis was performed following the standard protocol without IV contrast.   COMPARISON:  05/30/2012  FINDINGS: Mild atelectasis, right middle lobe.  Calcified mitral valve.  Small hiatal hernia, type 1.  Prior cholecystectomy.  The noncontrast CT appearance of the liver, spleen, pancreas, and adrenal glands is within normal limits. No hydronephrosis, renal, or ureteral calculi are identified. There are numerous vascular calcifications in the pelvis is. No bladder stone identified.  Aortoiliac atherosclerotic vascular disease. Vascular calcifications along the margins of the uterus noted.  Acetabular protrusio is present with considerable bony demineralization. Old right iliac crest deformity. levoconvex lumbar scoliosis. Grade 1 anterolisthesis at L3-4 and L4-5 noted. Multilevel facet arthropathy, probable foraminal narrowing on the right at L3-4 and L4-5 due to disc protrusions and facet arthropathy.  Abnormal thickening of the left iliacus and psoas muscle along the inferior pelvis and tracking into the upper leg, with expansion noted. There is a small distal calcification along this process near the lesser trochanter.  IMPRESSION: 1. Suspected left iliopsoas avulsion, with a small linear calcification along the suspected avulsion site from the left lesser trochanter, and with abnormal thickening favoring hematoma within the distal iliopsoas. Alternatively this could represent spontaneous intramuscular hemorrhage in the setting of calcific tendinopathy rather than avulsion. 2. Numerous ancillary findings include lumbar spondylosis and degenerative disc disease causing foraminal impingement ; atherosclerosis; a small hiatal hernia ; minimal right middle lobe atelectasis; calcified mitral valve; protrusio acetabuli; and pelvic deformity due to old fractures.   Electronically Signed   By: Sherryl Barters M.D.   On: 12/05/2013 20:10     EKG Interpretation None      MDM   Final diagnoses:  UTI (lower urinary tract infection)  Muscle tear    80 year old female presents with left  flank pain. Began 2 days ago. Radiates to her back and down into her left thigh and knee. Does have history back pain. Also has history of kidney infections. She states some mild burning dysuria for the past couple days also.  Here vitals stable. Has some mild left flank pain and left CVA tenderness on exam. She has history of kidney disease, so we will scan her without contrast. Also check a urine and basic labs. Concern for UTI. Patient's family states she is havig major difficulty with moving around and getting around in the house due to her hip problem. Has spent time in rehab after her BKA and has been going to PT to help with her walking. She is having much more difficulty in moving since this pain started. UA shows UTI, rocephin given. Patient has iliopsoas avulsion also of the L hip. I spoke with Ortho, who stated this is non-operative. Patient cannot ambulate, admitted for UTI and for her hip pain for further eval, pain management, and possible PT.   Osvaldo Shipper, MD 12/05/13 2330

## 2013-12-06 ENCOUNTER — Encounter: Payer: Medicare Other | Admitting: Physical Therapy

## 2013-12-06 DIAGNOSIS — T148XXA Other injury of unspecified body region, initial encounter: Secondary | ICD-10-CM

## 2013-12-06 LAB — GLUCOSE, CAPILLARY
Glucose-Capillary: 196 mg/dL — ABNORMAL HIGH (ref 70–99)
Glucose-Capillary: 154 mg/dL — ABNORMAL HIGH (ref 70–99)
Glucose-Capillary: 144 mg/dL — ABNORMAL HIGH (ref 70–99)
Glucose-Capillary: 147 mg/dL — ABNORMAL HIGH (ref 70–99)
Glucose-Capillary: 159 mg/dL — ABNORMAL HIGH (ref 70–99)
Glucose-Capillary: 218 mg/dL — ABNORMAL HIGH (ref 70–99)

## 2013-12-06 MED ORDER — INSULIN ASPART 100 UNIT/ML ~~LOC~~ SOLN
0.0000 [IU] | Freq: Three times a day (TID) | SUBCUTANEOUS | Status: DC
  Administered 2013-12-06: 2 [IU] via SUBCUTANEOUS
  Administered 2013-12-06: 3 [IU] via SUBCUTANEOUS
  Administered 2013-12-07 (×2): 5 [IU] via SUBCUTANEOUS
  Administered 2013-12-07: 3 [IU] via SUBCUTANEOUS
  Administered 2013-12-08 (×2): 5 [IU] via SUBCUTANEOUS

## 2013-12-06 MED ORDER — INSULIN ASPART 100 UNIT/ML ~~LOC~~ SOLN
0.0000 [IU] | SUBCUTANEOUS | Status: DC
  Administered 2013-12-06: 2 [IU] via SUBCUTANEOUS
  Administered 2013-12-06: 3 [IU] via SUBCUTANEOUS

## 2013-12-06 MED ORDER — BISACODYL 5 MG PO TBEC: 5.0000 mg | DELAYED_RELEASE_TABLET | Freq: Every day | ORAL | Status: DC | PRN

## 2013-12-06 MED ORDER — ALBUTEROL SULFATE (2.5 MG/3ML) 0.083% IN NEBU: 2.5000 mg | INHALATION_SOLUTION | RESPIRATORY_TRACT | Status: DC | PRN

## 2013-12-06 MED ORDER — INSULIN ASPART 100 UNIT/ML ~~LOC~~ SOLN
0.0000 [IU] | Freq: Every day | SUBCUTANEOUS | Status: DC
  Administered 2013-12-06 – 2013-12-07 (×2): 2 [IU] via SUBCUTANEOUS

## 2013-12-06 MED ORDER — SODIUM CHLORIDE 0.9 % IV SOLN
INTRAVENOUS | Status: DC
  Administered 2013-12-06: 50 mL/h via INTRAVENOUS

## 2013-12-06 NOTE — Progress Notes (Signed)
Utilization review completed.  

## 2013-12-06 NOTE — Progress Notes (Signed)
RT placed pt on Auto CPAP 6-15 CMH20 with 2 LPM O2 bleed in via nasal mask.  Pt tolerating well at this time, RT to monitor and assess as needed.

## 2013-12-06 NOTE — Evaluation (Addendum)
Physical Therapy Evaluation Patient Details Name: [name] MRN: [id number] DOB: [birth year] Today's Date: 12/06/2013   History of Present Illness  Pt admitted with pain in L hip ( side with BKA) Pt with Iliopsoas hematoma and avulsion L hip  Clinical Impression  **Pt admitted with *L iliopsoas avulsion from lesser trochanter, and hematoma**. Pt currently with functional limitations due to the deficits listed below (see PT Problem List).  Pt will benefit from skilled PT to increase their independence and safety with mobility to allow discharge to the venue listed below.   At baseline pt was walking with LLE BKA prosthesis and a walker. She has 6/10 pain with L hip flexion ROM at present. She sat on EOB x 10 minutes and stood briefly. Activity tolerance limited by pain. Will attempt transfer to chair tomorrow.   *    Follow Up Recommendations SNF    Equipment Recommendations  None recommended by PT    Recommendations for Other Services       Precautions / Restrictions Precautions Precautions: Fall Precaution Comments: L BKA Restrictions Weight Bearing Restrictions: No      Mobility  Bed Mobility Overal bed mobility: Needs Assistance;+2 for physical assistance Bed Mobility: Supine to Sit     Supine to sit: +2 for physical assistance;Max assist     General bed mobility comments: pt 50%, assist to pivot hip and raise trunk  Transfers Overall transfer level: Needs assistance   Transfers: Sit to/from Stand Sit to Stand: +2 physical assistance;Mod assist         General transfer comment: pt stood for 30 seconds to pull up briefs, she doesn't have LLE prosthesis here, will ask family to bring it in  Ambulation/Gait                Stairs            Wheelchair Mobility    Modified Rankin (Stroke Patients Only)       Balance Overall balance assessment: Needs assistance Sitting-balance support: Single extremity supported Sitting  balance-Leahy Scale: Fair Sitting balance - Comments: with RLE supported, pt sat on EOB x 10 minutes, tolerance limited by L hip pain                                     Pertinent Vitals/Pain **6/10 L hip with activity Premedicated, ice applied*    Home Living Family/patient expects to be discharged to:: Private residence Living Arrangements: Spouse/significant other Available Help at Discharge: Family Type of Home: House Home Access: Ramped entrance     Home Layout: One level Home Equipment: Environmental consultant - 2 wheels;Walker - 4 wheels;Walker - standard;Wheelchair - manual;Shower seat - built in;Shower seat      Prior Function Level of Independence: Needs assistance         Comments: walked with RW and L prosthesis independently, husband assists with bathing and dressing     Hand Dominance        Extremity/Trunk Assessment   Upper Extremity Assessment: Overall WFL for tasks assessed           Lower Extremity Assessment: Overall WFL for tasks assessed;LLE deficits/detail (RLE strength 5/5, L knee ext 5/5, pain with L hip flexion AAROM)   LLE Deficits / Details: tolerated hip flexion AAROM to 30*, limited by pain, L hip ABDuction AAROM WFL  Cervical / Trunk Assessment: Normal  Communication   Communication: No difficulties  Cognition Arousal/Alertness: Awake/alert Behavior During Therapy: WFL for tasks assessed/performed Overall Cognitive Status: Within Functional Limits for tasks assessed                      General Comments      Exercises General Exercises - Lower Extremity Quad Sets: AROM;Left;5 reps;Supine Gluteal Sets: AROM;Both;5 reps;Supine      Assessment/Plan    PT Assessment Patient needs continued PT services  PT Diagnosis Acute pain;Difficulty walking   PT Problem List Decreased strength;Decreased activity tolerance;Decreased balance;Decreased mobility;Pain;Obesity  PT Treatment Interventions DME instruction;Gait  training;Functional mobility training;Therapeutic activities;Patient/family education;Therapeutic exercise   PT Goals (Current goals can be found in the Care Plan section) Acute Rehab PT Goals Patient Stated Goal: to decrease pain, walk PT Goal Formulation: With patient/family Time For Goal Achievement: 12/20/13 Potential to Achieve Goals: Good    Frequency Min 4X/week   Barriers to discharge        Co-evaluation PT/OT/SLP Co-Evaluation/Treatment: Yes Reason for Co-Treatment: For patient/therapist safety PT goals addressed during session: Mobility/safety with mobility;Strengthening/ROM         End of Session   Activity Tolerance: Patient limited by pain;Patient limited by fatigue Patient left: in bed;with call bell/phone within reach;with family/visitor present Nurse Communication: Mobility status    Functional Assessment Tool Used: clinical judgement Functional Limitation: Mobility: Walking and moving around Mobility: Walking and Moving Around Current Status (K2706): At least 40 percent but less than 60 percent impaired, limited or restricted Mobility: Walking and Moving Around Goal Status [phone]): At least 1 percent but less than 20 percent impaired, limited or restricted    Time: 1050-1121 PT Time Calculation (min): 31 min   Charges:   PT Evaluation $Initial PT Evaluation Tier I: 1 Procedure PT Treatments $Therapeutic Activity: 8-22 mins   PT G Codes:   Functional Assessment Tool Used: clinical judgement Functional Limitation: Mobility: Walking and moving around    [place], [employer] 12/06/2013, 11:31 AM [phone]

## 2013-12-06 NOTE — Progress Notes (Signed)
Nutrition Brief Note  Patient identified on the Malnutrition Screening Tool (MST) Report  Wt Readings from Last 15 Encounters:  12/05/13 215 lb 2.7 oz (97.6 kg)  11/15/13 219 lb (99.338 kg)  08/05/13 209 lb (94.802 kg)  07/23/13 218 lb (98.884 kg)  06/23/13 200 lb (90.719 kg)  12/29/12 200 lb (90.719 kg)  12/16/12 202 lb (91.627 kg)  10/21/12 194 lb (87.998 kg)  09/30/12 194 lb (87.998 kg)  09/23/12 187 lb (84.823 kg)  09/08/12 188 lb 4.4 oz (85.4 kg)  09/08/12 188 lb 4.4 oz (85.4 kg)  09/08/12 188 lb 4.4 oz (85.4 kg)  07/07/12 215 lb (97.523 kg)  06/26/12 217 lb (98.431 kg)    Body mass index is 34.75 kg/(m^2). Patient meets criteria for Obesity I based on current BMI.   Current diet order is Carb Modified, patient is consuming approximately 75% of meals at this time. Labs and medications reviewed.   Patient reported decreased appetite since start of physical therapy for BKA in 2014; however noted that is has been improving. Ate 75% of lunch, noted some mild abd pain that was able to relieved with a BM. Diet recall indicates pt consuming 2 balanced meals/day. Offered DM diet education; however pt declined as she reported having Scientist, clinical (histocompatibility and immunogenetics) and recipes at home. Denied any unintentional wt loss  Encouraged pt to contact RD with any DM2 nutrition related questions No nutrition interventions warranted at this time. If nutrition issues arise, please consult RD.    Atlee Abide MS RD LDN Clinical Dietitian [phone]

## 2013-12-06 NOTE — Progress Notes (Addendum)
Progress Note   [name] BSJ:[id number] DOB: Nov 10, 1929 DOA: 12/05/2013 PCP: Renato Shin, MD   Brief Narrative:   [name] is an 80 y.o. female with PMH of diabetes mellitus, hypertension, GERD, peripheral vascular disease, left BKA, diastolic dysfunction, obstructive sleep apnea, COPD who was admitted 12/05/13 with left hip pain.  No fall/injury.   Assessment/Plan:   Principal Problem: Avulsion injury of left iliopsoas and hematoma with left hip pain  CT scan of the abdomen shows avulsion injury of the left iliopsoas and hematoma versus spontaneous intramuscular hemorrhage.  Continue conservative management with pain control, PT/OT, ice to affected area.  Aspirin on hold.  Active Problems: DM (diabetes mellitus), type 2, uncontrolled, with renal and neurological complications  Continue Neurontin for diabetic neuropathy.  Currently on moderate scale SSI.  CBGs 154-196.  OBSTRUCTIVE SLEEP APNEA  Continue nocturnal CPAP.  HYPERTENSION  Continue hydralazine, labetalol, Zaroxolyn, Demadex and Imdur.  CORONARY ARTERY DISEASE  Continue aspirin, labetalol, Imdur and Zetia.  Chronic combined systolic and diastolic heart failure  Well compensated.  COPD  Continue albuterol and Dulera.  GERD   Lower limb amputation, below knee  UTI (urinary tract infection)  Continue empiric Rocephin. Urinalysis showed many bacteria and too numerous to count WBCs. Unfortunately, urine cultures not sent at the time of admission.  DVT Prophylaxis  Continue subcutaneous heparin.  Code Status: Full. Family Communication: Daughter at bedside. Disposition Plan: Home when stable.   IV Access:    Peripheral IV   Procedures:    None.   Medical Consultants:    None.   Other Consultants:    None.   Anti-Infectives:    None.  Subjective:    [name] is having left hip pain, but pain controlled with medication.  No N/V  or dyspnea.  Objective:    Filed Vitals:   12/06/13 0100 12/06/13 0638 12/06/13 0731 12/06/13 0749  BP: 165/65 173/77 118/67   Pulse: 60 56 51   Temp:  98 F (36.7 C) 98.1 F (36.7 C)   TempSrc:  Oral Oral   Resp: 16 16 16    Height:      Weight:      SpO2:  100% 97% 99%   No intake or output data in the 24 hours ending 12/06/13 0802  Exam: Gen:  NAD Cardiovascular:  RRR, No M/R/G Respiratory:  Lungs CTAB Gastrointestinal:  Abdomen soft, NT/ND, + BS Extremities:  No C/E/C RLE, L BKA   Data Reviewed:    Labs: Basic Metabolic Panel:  Recent Labs Lab 12/05/13 1853  NA 142  K 4.7  CL 103  CO2 26  GLUCOSE 169*  BUN 24*  CREATININE 1.50*  CALCIUM 9.0   GFR Estimated Creatinine Clearance: 34.1 ml/min (by C-G formula based on Cr of 1.5). Liver Function Tests:  Recent Labs Lab 12/05/13 1853  AST 21  ALT 9  ALKPHOS 89  BILITOT 0.4  PROT 6.7  ALBUMIN 3.4*   CBC:  Recent Labs Lab 12/05/13 1853  WBC 8.3  HGB 13.5  HCT 43.0  MCV 85.7  PLT 294   BNP (last 3 results)  Recent Labs  07/12/13 1639 08/05/13 0936 11/15/13 1205  PROBNP 465.0* 400.0* 158.0*   CBG:  Recent Labs Lab 12/05/13 2356 12/06/13 0411 12/06/13 0729  GLUCAP 158* 196* 154*   Sepsis Labs:  Recent Labs Lab 12/05/13 1853 12/05/13 1904  WBC 8.3  --   LATICACIDVEN  --  1.51   Microbiology  No results found for this or any previous visit (from the past 240 hour(s)).   Radiographs/Studies:   Ct Abdomen Pelvis Wo Contrast  12/05/2013   CLINICAL DATA:  Left-sided abdominal pain extending into the left lower extremity. Left flank pain.  EXAM: CT ABDOMEN AND PELVIS WITHOUT CONTRAST  TECHNIQUE: Multidetector CT imaging of the abdomen and pelvis was performed following the standard protocol without IV contrast.  COMPARISON:  05/30/2012  FINDINGS: Mild atelectasis, right middle lobe.  Calcified mitral valve.  Small hiatal hernia, type 1.  Prior cholecystectomy.  The noncontrast  CT appearance of the liver, spleen, pancreas, and adrenal glands is within normal limits. No hydronephrosis, renal, or ureteral calculi are identified. There are numerous vascular calcifications in the pelvis is. No bladder stone identified.  Aortoiliac atherosclerotic vascular disease. Vascular calcifications along the margins of the uterus noted.  Acetabular protrusio is present with considerable bony demineralization. Old right iliac crest deformity. levoconvex lumbar scoliosis. Grade 1 anterolisthesis at L3-4 and L4-5 noted. Multilevel facet arthropathy, probable foraminal narrowing on the right at L3-4 and L4-5 due to disc protrusions and facet arthropathy.  Abnormal thickening of the left iliacus and psoas muscle along the inferior pelvis and tracking into the upper leg, with expansion noted. There is a small distal calcification along this process near the lesser trochanter.  IMPRESSION: 1. Suspected left iliopsoas avulsion, with a small linear calcification along the suspected avulsion site from the left lesser trochanter, and with abnormal thickening favoring hematoma within the distal iliopsoas. Alternatively this could represent spontaneous intramuscular hemorrhage in the setting of calcific tendinopathy rather than avulsion. 2. Numerous ancillary findings include lumbar spondylosis and degenerative disc disease causing foraminal impingement ; atherosclerosis; a small hiatal hernia ; minimal right middle lobe atelectasis; calcified mitral valve; protrusio acetabuli; and pelvic deformity due to old fractures.   Electronically Signed   By: Sherryl Barters M.D.   On: 12/05/2013 20:10    Medications:   . allopurinol  100 mg Oral Daily  . aspirin  325 mg Oral Daily  . cefTRIAXone (ROCEPHIN)  IV  1 g Intravenous Q24H  . docusate sodium  100 mg Oral BID  . ezetimibe  10 mg Oral q morning - 10a  . gabapentin  300 mg Oral QHS  . heparin  5,000 Units Subcutaneous 3 times per day  . hydrALAZINE  25 mg  Oral TID  . insulin aspart  0-15 Units Subcutaneous 6 times per day  . isosorbide mononitrate  30 mg Oral Daily  . labetalol  100 mg Oral BID  . metolazone  2.5 mg Oral BID  . mometasone-formoterol  2 puff Inhalation BID  . pantoprazole  40 mg Oral BID  . sertraline  100 mg Oral Daily  . torsemide  40 mg Oral Daily   Continuous Infusions: . sodium chloride 50 mL/hr (12/06/13 0145)    Time spent: 25 minutes.   LOS: 1 day   RAMA,CHRISTINA  [facility] Pager [phone]. If unable to reach me by pager, please call my cell phone at [phone].  *Please refer to [email or web address], password [email or web address] to get updated schedule on who will round on this patient, as hospitalists switch teams weekly. If 7PM-7AM, please contact night-coverage at [email or web address], password [email or web address] for any overnight needs.  12/06/2013, 8:02 AM    **Disclaimer: This note was dictated with voice recognition software. Similar sounding words can inadvertently be transcribed and this note may contain transcription errors which may not have been corrected upon publication of note.**

## 2013-12-06 NOTE — Evaluation (Signed)
Occupational Therapy Evaluation Patient Details Name: [name] MRN: [id number] DOB: [birth year] Today's Date: 12/06/2013    History of Present Illness Pt admitted with *L iliopsoas avulsion from lesser trochanter, and hematoma**. Pt currently with functional limitations with ADL 's due to the deficits listed below (see OT Problem List).  Pt will benefit from skilled OT to increase their independence with ADL  to allow discharge to the venue listed below.    Clinical Impression   Pt presents to OT with decreased I with ADL activity and will benefit from skilled OT to increase I with ADL activity and return to PLOF    Follow Up Recommendations  SNF    Equipment Recommendations  None recommended by OT       Precautions / Restrictions Precautions Precautions: Fall Precaution Comments: L BKA Restrictions Weight Bearing Restrictions: No      Mobility Bed Mobility Overal bed mobility: Needs Assistance;+2 for physical assistance Bed Mobility: Supine to Sit     Supine to sit: +2 for physical assistance;Max assist     General bed mobility comments: pt 50%, assist to pivot hip and raise trunk  Transfers Overall transfer level: Needs assistance   Transfers: Sit to/from Stand Sit to Stand: +2 physical assistance;Mod assist         General transfer comment: pt stood for 30 seconds to pull up briefs, she doesn't have LLE prosthesis here, will ask family to bring it in    Balance Overall balance assessment: Needs assistance Sitting-balance support: Single extremity supported Sitting balance-Leahy Scale: Fair Sitting balance - Comments: with RLE supported, pt sat on EOB x 10 minutes, tolerance limited by L hip pain                                    ADL Overall ADL's : Needs assistance/impaired     Grooming: Sitting;Minimal assistance   Upper Body Bathing: Sitting;Minimal assitance   Lower Body Bathing: Sit to/from stand;+2 for physical  assistance;Total assistance   Upper Body Dressing : Minimal assistance;Sitting   Lower Body Dressing: Sit to/from stand;+2 for physical assistance;Total assistance                                 Extremity/Trunk Assessment Upper Extremity Assessment Upper Extremity Assessment: Overall WFL for tasks assessed   Lower Extremity Assessment Lower Extremity Assessment: Overall WFL for tasks assessed;LLE deficits/detail (RLE strength 5/5, L knee ext 5/5, pain with L hip flexion AAROM) LLE Deficits / Details: tolerated hip flexion AAROM to 30*, limited by pain, L hip ABDuction AAROM WFL   Cervical / Trunk Assessment Cervical / Trunk Assessment: Normal   Communication Communication Communication: No difficulties   Cognition Arousal/Alertness: Awake/alert Behavior During Therapy: WFL for tasks assessed/performed Overall Cognitive Status: Within Functional Limits for tasks assessed                                Home Living Family/patient expects to be discharged to:: Private residence Living Arrangements: Spouse/significant other Available Help at Discharge: Family Type of Home: House Home Access: Ramped entrance     Home Layout: One level     Bathroom Shower/Tub: Hospital doctor Toilet: Handicapped height     Home Equipment: Environmental consultant - 2 wheels;Walker - 4 wheels;Walker - standard;Wheelchair -  manual;Shower seat - built in;Shower seat          Prior Functioning/Environment Level of Independence: Needs assistance        Comments: walked with RW and L prosthesis independently, husband assists with bathing and dressing    OT Diagnosis: Generalized weakness;Acute pain   OT Problem List: Decreased strength;Decreased activity tolerance;Impaired balance (sitting and/or standing);Pain   OT Treatment/Interventions: Self-care/ADL training;Therapeutic exercise;Therapeutic activities;DME and/or AE instruction;Patient/family education    OT  Goals(Current goals can be found in the care plan section) Acute Rehab OT Goals Patient Stated Goal: to decrease pain, walk OT Goal Formulation: With patient Time For Goal Achievement: January 04, 2014 Potential to Achieve Goals: Good  OT Frequency: Min 2X/week   Barriers to D/C:            Co-evaluation PT/OT/SLP Co-Evaluation/Treatment: Yes Reason for Co-Treatment: For patient/therapist safety PT goals addressed during session: Mobility/safety with mobility;Strengthening/ROM        End of Session Nurse Communication: Mobility status  Activity Tolerance: Patient tolerated treatment well Patient left: in bed;with call bell/phone within reach;with family/visitor present   Time: 1448-1856 OT Time Calculation (min): 32 min Charges:  OT General Charges $OT Visit: 1 Procedure OT Evaluation $Initial OT Evaluation Tier I: 1 Procedure OT Treatments $Self Care/Home Management : 8-22 mins G-Codes:    Payton Mccallum D 01-04-2014, 11:42 AM

## 2013-12-06 NOTE — Progress Notes (Signed)
Clinical Social Work Department BRIEF PSYCHOSOCIAL ASSESSMENT 12/06/2013  Patient:  [name], [name]     Account Number:  [id number]     Admit date:  12/05/2013  Clinical Social Worker:  Lacie Scotts  Date/Time:  12/06/2013 03:06 PM  Referred by:  CSW  Date Referred:  12/06/2013 Referred for  SNF Placement   Other Referral:   Interview type:   Other interview type:    PSYCHOSOCIAL DATA Living Status:  FAMILY Admitted from facility:   Level of care:   Primary support name:  Arnell Sieving Primary support relationship to patient:  SPOUSE Degree of support available:   supportive    CURRENT CONCERNS Current Concerns  Post-Acute Placement   Other Concerns:    SOCIAL WORK ASSESSMENT / PLAN Pt is an 80 yr old female living at home with spouse and daughter prior to hospitalization. CSW met with pt / family to assist with d/c planning. Pt / family do not feel pt's care can be managed at home unless pt progresses with ADL's. Pt / family are willing to consider ST Rehab. SNF search has been initiated and [facility] contacted at pt's request. [facility] has been contacted and authorization for SNF has been requested.CSW will continue to follow to assist with d/c planning.   Assessment/plan status:  Psychosocial Support/Ongoing Assessment of Needs Other assessment/ plan:   Information/referral to community resources:   Insurance coverage for SNF and ambulance transport reviewed.    PATIENT'S/FAMILY'S RESPONSE TO PLAN OF CARE: Pt would like to return home but realizes, at this point, she needs more care than her family can provide. If pt progresses prior to d/c she will return home with [facility] services. Pt/family feel, most likely, ST Rehab will be needed. Pt states she had a good experience at [facility] and would like to return. A decision is pending from SNF.   Werner Lean LCSW [phone]

## 2013-12-06 NOTE — Progress Notes (Signed)
Clinical Social Work Department CLINICAL SOCIAL WORK PLACEMENT NOTE 12/06/2013  Patient:  HAGAN, MALTZ  Account Number:  [id number] Admit date:  12/05/2013  Clinical Social Worker:  Werner Lean, LCSW  Date/time:  12/06/2013 03:18 PM  Clinical Social Work is seeking post-discharge placement for this patient at the following level of care:   SKILLED NURSING   (*CSW will update this form in Epic as items are completed)     Patient/family provided with [place] Department of Clinical Social Work's list of facilities offering this level of care within the geographic area requested by the patient (or if unable, by the patient's family).  12/06/2013  Patient/family informed of their freedom to choose among providers that offer the needed level of care, that participate in Medicare, Medicaid or managed care program needed by the patient, have an available bed and are willing to accept the patient.    Patient/family informed of [facility]' ownership interest in [facility], as well as of the fact that they are under no obligation to receive care at this facility.  PASARR submitted to EDS on 12/06/2013 PASARR number received on 12/06/2013  FL2 transmitted to all facilities in geographic area requested by pt/family on  12/06/2013 FL2 transmitted to all facilities within larger geographic area on   Patient informed that his/her managed care company has contracts with or will negotiate with  certain facilities, including the following:     Patient/family informed of bed offers received:   Patient chooses bed at  Physician recommends and patient chooses bed at    Patient to be transferred to  on   Patient to be transferred to facility by  Patient and family notified of transfer on  Name of family member notified:    The following physician request were entered in Epic:   Additional Comments:  Werner Lean LCSW [phone]

## 2013-12-06 NOTE — Progress Notes (Signed)
PHYSICAL THERAPY NOTE- Patient has bedrest orders currently. Will need activity orders upgraded to OOB in order for PT to evaluate pt. ALSO- Are there any weightbearing restrictions. Please advise  when patient is able to mobilize. Thank you. Tresa Endo PT [phone]

## 2013-12-07 DIAGNOSIS — R109 Unspecified abdominal pain: Secondary | ICD-10-CM

## 2013-12-07 LAB — GLUCOSE, CAPILLARY
GLUCOSE-CAPILLARY: 199 mg/dL — AB (ref 70–99)
GLUCOSE-CAPILLARY: 213 mg/dL — AB (ref 70–99)
GLUCOSE-CAPILLARY: 220 mg/dL — AB (ref 70–99)
Glucose-Capillary: 212 mg/dL — ABNORMAL HIGH (ref 70–99)

## 2013-12-07 NOTE — Progress Notes (Signed)
CSW assisting with d/c planning. [facility] has provided authorization for ST SNF at [facility]. Spoke with MD this am. Pt not ready for d/c to SNF today. CSW will continue to follow to assist with d/c planning to SNF.  Werner Lean LCSW [phone]

## 2013-12-07 NOTE — Progress Notes (Signed)
CSW assisting with d/c planning. [facility] has made a bed offer pending [facility] [place] authorization. Insurance authorization has been requested and a decision is pending.  Werner Lean LCSW [phone]

## 2013-12-07 NOTE — Progress Notes (Addendum)
Pt placed on Auto CPAP 6-20 CMH20 via FFM with 2 LPM O2 bleed in. Pt tolerating well at this time, RT to monitor and assess as needed.

## 2013-12-07 NOTE — Progress Notes (Signed)
Patient ID: [name], female   DOB: 24-Jan-1930, 80 y.o.   MRN: [id number]  [facility] PROGRESS NOTE  [name] [id number] DOB: Apr 29, 1930 DOA: 12/05/2013 PCP: Renato Shin, MD  Brief Narrative:   80 y.o. female with diabetes mellitus, hypertension, GERD, peripheral vascular disease, left BKA, diastolic dysfunction, obstructive sleep apnea, COPD, admitted 12/05/13 with left hip pain. No fall/injury.   Assessment/Plan:   Principal Problem:  Avulsion injury of left iliopsoas and hematoma with left hip pain  CT scan of the abdomen shows avulsion injury of the left iliopsoas and hematoma versus spontaneous intramuscular hemorrhage.  Continue conservative management with pain control, PT/OT, ice to affected area.  Aspirin resumed, no signs of active bleeding  CBC in AM Active Problems:  DM (diabetes mellitus), type 2, uncontrolled, with renal and neurological complications  Continue Neurontin for diabetic neuropathy.  Currently on moderate scale SSI. CBGs 150 - 200's OBSTRUCTIVE SLEEP APNEA  Continue nocturnal CPAP. HYPERTENSION  Continue hydralazine, labetalol, Zaroxolyn, Demadex and Imdur. Reasonable inpatient control  CORONARY ARTERY DISEASE  Continue aspirin, labetalol, Imdur and Zetia. Chronic combined systolic and diastolic heart failure  Well compensated. Weight 215 lbs  COPD  Continue albuterol and Dulera. GERD   Continue PPI Lower limb amputation, below knee   Stable  Chronic renal failure  Repeat BMP in AM UTI (urinary tract infection)   Continue empiric Rocephin. Urinalysis showed many bacteria and too numerous to count WBCs.   Unfortunately, urine cultures not sent at the time of admission. DVT Prophylaxis  Continue subcutaneous heparin.  Code Status: Full.  Family Communication: Daughter at bedside.  Disposition Plan: Remains inpatient   IV Access:   Peripheral IV Procedures:   None. Medical Consultants:   None. Other  Consultants:   None. Anti-Infectives:   Rocephin 6/22 -->   Leisa Lenz, MD  [facility] Pager [phone]  If 7PM-7AM, please contact night-coverage [email or web address] Password [email or web address] 12/07/2013, 11:55 AM   LOS: 2 days   HPI/Subjective: No acute overnight events.  Objective: Filed Vitals:   12/07/13 0530 12/07/13 0800 12/07/13 0856 12/07/13 1040  BP: 172/80   169/80  Pulse: 64   66  Temp: 98.1 F (36.7 C)   98.8 F (37.1 C)  TempSrc: Oral   Oral  Resp: 18 18    Height:      Weight:      SpO2: 100% 98% 100% 96%    Intake/Output Summary (Last 24 hours) at 12/07/13 1155 Last data filed at 12/07/13 1005  Gross per 24 hour  Intake 1683.33 ml  Output      0 ml  Net 1683.33 ml    Exam:   General:  Pt is alert, follows commands appropriately, not in acute distress  Cardiovascular: Regular rate and rhythm, S1/S2  Respiratory: Clear to auscultation bilaterally, no wheezing, no crackles, no rhonchi  Abdomen: Soft, non tender, non distended, bowel sounds present  Extremities: Left BKA  Neuro: Grossly nonfocal  Data Reviewed: Basic Metabolic Panel:  Recent Labs Lab 12/05/13 1853  NA 142  K 4.7  CL 103  CO2 26  GLUCOSE 169*  BUN 24*  CREATININE 1.50*  CALCIUM 9.0   Liver Function Tests:  Recent Labs Lab 12/05/13 1853  AST 21  ALT 9  ALKPHOS 89  BILITOT 0.4  PROT 6.7  ALBUMIN 3.4*   CBC:  Recent Labs Lab 12/05/13 1853  WBC 8.3  HGB 13.5  HCT 43.0  MCV 85.7  PLT 294   CBG:  Recent Labs Lab 12/06/13 1027 12/06/13 1136 12/06/13 1519 12/06/13 2153 12/07/13 0717  GLUCAP 144* 147* 159* 218* 199*   Studies: Ct Abdomen Pelvis Wo Contrast  12/05/2013   CLINICAL DATA:  Left-sided abdominal pain extending into the left lower extremity. Left flank pain.  EXAM: CT ABDOMEN AND PELVIS WITHOUT CONTRAST  TECHNIQUE: Multidetector CT imaging of the abdomen and pelvis was performed following the standard protocol without IV contrast.  COMPARISON:   05/30/2012  FINDINGS: Mild atelectasis, right middle lobe.  Calcified mitral valve.  Small hiatal hernia, type 1.  Prior cholecystectomy.  The noncontrast CT appearance of the liver, spleen, pancreas, and adrenal glands is within normal limits. No hydronephrosis, renal, or ureteral calculi are identified. There are numerous vascular calcifications in the pelvis is. No bladder stone identified.  Aortoiliac atherosclerotic vascular disease. Vascular calcifications along the margins of the uterus noted.  Acetabular protrusio is present with considerable bony demineralization. Old right iliac crest deformity. levoconvex lumbar scoliosis. Grade 1 anterolisthesis at L3-4 and L4-5 noted. Multilevel facet arthropathy, probable foraminal narrowing on the right at L3-4 and L4-5 due to disc protrusions and facet arthropathy.  Abnormal thickening of the left iliacus and psoas muscle along the inferior pelvis and tracking into the upper leg, with expansion noted. There is a small distal calcification along this process near the lesser trochanter.  IMPRESSION: 1. Suspected left iliopsoas avulsion, with a small linear calcification along the suspected avulsion site from the left lesser trochanter, and with abnormal thickening favoring hematoma within the distal iliopsoas. Alternatively this could represent spontaneous intramuscular hemorrhage in the setting of calcific tendinopathy rather than avulsion. 2. Numerous ancillary findings include lumbar spondylosis and degenerative disc disease causing foraminal impingement ; atherosclerosis; a small hiatal hernia ; minimal right middle lobe atelectasis; calcified mitral valve; protrusio acetabuli; and pelvic deformity due to old fractures.   Electronically Signed   By: Sherryl Barters M.D.   On: 12/05/2013 20:10    Scheduled Meds: . allopurinol  100 mg Oral Daily  . aspirin  325 mg Oral Daily  . cefTRIAXone (ROCEPHIN)  IV  1 g Intravenous Q24H  . docusate sodium  100 mg Oral  BID  . ezetimibe  10 mg Oral q morning - 10a  . gabapentin  300 mg Oral QHS  . heparin  5,000 Units Subcutaneous 3 times per day  . hydrALAZINE  25 mg Oral TID  . insulin aspart  0-15 Units Subcutaneous TID WC  . insulin aspart  0-5 Units Subcutaneous QHS  . isosorbide mononitrate  30 mg Oral Daily  . labetalol  100 mg Oral BID  . metolazone  2.5 mg Oral BID  . mometasone-formoterol  2 puff Inhalation BID  . pantoprazole  40 mg Oral BID  . sertraline  100 mg Oral Daily  . torsemide  40 mg Oral Daily   Continuous Infusions: . sodium chloride 50 mL/hr (12/06/13 0145)

## 2013-12-07 NOTE — Progress Notes (Signed)
Utilization review completed.  

## 2013-12-07 NOTE — Progress Notes (Signed)
Pt stated that she wanted CPAP around  2100.  RN to call RT when finished giving night time meds.  RT to monitor and assess as needed.

## 2013-12-07 NOTE — Progress Notes (Signed)
RT placed pt on CPAP. CPAP setting is auto titrait ( min 6 and max 20 ). Water chamber filled with sterile water for humidification. 2 liters O2 bleed into tubing. Patient is tolerating well. RT will monitor as needed.

## 2013-12-07 NOTE — Progress Notes (Addendum)
Physical Therapy Treatment Patient Details Name: [name] MRN: [id number] DOB: 1929-11-03 Today's Date: 12/07/2013    History of Present Illness Pt admitted with *L iliopsoas avulsion from lesser trochanter, and hematoma**. Pt currently with functional limitations with ADL 's due to the deficits listed below (see OT Problem List).  Pt will benefit from skilled OT to increase their independence and safety with mobility to allow discharge to the venue listed below.     PT Comments    **Assisted pt to EOB, she was unable to tolerate sitting on EOB due to pain in L hip.  Will follow. *  Follow Up Recommendations  SNF     Equipment Recommendations  None recommended by PT    Recommendations for Other Services       Precautions / Restrictions Precautions Precautions: Fall Precaution Comments: L BKA Restrictions Weight Bearing Restrictions: No    Mobility  Bed Mobility Overal bed mobility: Needs Assistance;+2 for physical assistance Bed Mobility: Supine to Sit     Supine to sit: +2 for physical assistance;Mod assist     General bed mobility comments: pt 60%, assist to pivot hip and raise trunk  Transfers                 General transfer comment: pt unable to tolerate pain with sitting on EOB, requested return to supine  Ambulation/Gait                 Stairs            Wheelchair Mobility    Modified Rankin (Stroke Patients Only)       Balance     Sitting balance-Leahy Scale: Fair Sitting balance - Comments: with RLE supported, pt sat on EOB x 1 minute, tolerance limited by L hip pain                            Cognition Arousal/Alertness: Awake/alert Behavior During Therapy: WFL for tasks assessed/performed Overall Cognitive Status: Within Functional Limits for tasks assessed                      Exercises      General Comments        Pertinent Vitals/Pain **7/10 L hip with movement RN  notified Pt declined pain medicine prior to PT tx. *    Home Living                      Prior Function            PT Goals (current goals can now be found in the care plan section) Acute Rehab PT Goals Patient Stated Goal: to decrease pain, walk PT Goal Formulation: With patient/family Time For Goal Achievement: 12/20/13 Potential to Achieve Goals: Good Progress towards PT goals: Not progressing toward goals - comment (due to pain)    Frequency  Min 3X/week    PT Plan      Co-evaluation             End of Session   Activity Tolerance: Patient limited by pain Patient left: in bed;with call bell/phone within reach;with family/visitor present     Time: 6195-0932 PT Time Calculation (min): 9 min  Charges:  $Therapeutic Activity: 8-22 mins                    G Codes:      Blondell Reveal [employer]  12/07/2013, 1:32 PM [phone]

## 2013-12-08 ENCOUNTER — Encounter: Payer: Medicare Other | Admitting: Physical Therapy

## 2013-12-08 DIAGNOSIS — R109 Unspecified abdominal pain: Secondary | ICD-10-CM

## 2013-12-08 LAB — GLUCOSE, CAPILLARY
GLUCOSE-CAPILLARY: 225 mg/dL — AB (ref 70–99)
Glucose-Capillary: 201 mg/dL — ABNORMAL HIGH (ref 70–99)

## 2013-12-08 LAB — CBC
HCT: 42.1 % (ref 36.0–46.0)
Hemoglobin: 13 g/dL (ref 12.0–15.0)
MCH: 26.6 pg (ref 26.0–34.0)
MCHC: 30.9 g/dL (ref 30.0–36.0)
MCV: 86.1 fL (ref 78.0–100.0)
Platelets: 264 10*3/uL (ref 150–400)
RBC: 4.89 MIL/uL (ref 3.87–5.11)
RDW: 16.8 % — AB (ref 11.5–15.5)
WBC: 7.7 10*3/uL (ref 4.0–10.5)

## 2013-12-08 LAB — BASIC METABOLIC PANEL
BUN: 40 mg/dL — ABNORMAL HIGH (ref 6–23)
CALCIUM: 8.7 mg/dL (ref 8.4–10.5)
CO2: 29 mEq/L (ref 19–32)
Chloride: 92 mEq/L — ABNORMAL LOW (ref 96–112)
Creatinine, Ser: 1.94 mg/dL — ABNORMAL HIGH (ref 0.50–1.10)
GFR, EST AFRICAN AMERICAN: 27 mL/min — AB (ref >90)
GFR, EST NON AFRICAN AMERICAN: 23 mL/min — AB (ref >90)
GLUCOSE: 195 mg/dL — AB (ref 70–99)
POTASSIUM: 2.8 meq/L — AB (ref 3.7–5.3)
SODIUM: 139 meq/L (ref 137–147)

## 2013-12-08 MED ORDER — BISACODYL 5 MG PO TBEC: 5.0000 mg | DELAYED_RELEASE_TABLET | Freq: Every day | ORAL | Status: DC | PRN

## 2013-12-08 MED ORDER — SERTRALINE HCL 100 MG PO TABS: 100.0000 mg | ORAL_TABLET | Freq: Every day | ORAL | Status: DC

## 2013-12-08 MED ORDER — POLYETHYLENE GLYCOL 3350 17 G PO PACK: 17.0000 g | PACK | Freq: Every day | ORAL | Status: DC | PRN

## 2013-12-08 MED ORDER — CIPROFLOXACIN HCL 500 MG PO TABS: 500.0000 mg | ORAL_TABLET | Freq: Two times a day (BID) | ORAL | Status: DC

## 2013-12-08 MED ORDER — POTASSIUM CHLORIDE CRYS ER 20 MEQ PO TBCR: 20.0000 meq | EXTENDED_RELEASE_TABLET | Freq: Two times a day (BID) | ORAL | Status: DC

## 2013-12-08 MED ORDER — HYDROCODONE-ACETAMINOPHEN 10-325 MG PO TABS: 1.0000 | ORAL_TABLET | Freq: Four times a day (QID) | ORAL | Status: DC | PRN

## 2013-12-08 MED ORDER — ALBUTEROL SULFATE (2.5 MG/3ML) 0.083% IN NEBU: 2.5000 mg | INHALATION_SOLUTION | RESPIRATORY_TRACT | Status: DC | PRN

## 2013-12-08 MED ORDER — INSULIN ASPART 100 UNIT/ML ~~LOC~~ SOLN: 0.0000 [IU] | Freq: Three times a day (TID) | SUBCUTANEOUS | Status: DC

## 2013-12-08 MED ORDER — POTASSIUM CHLORIDE 20 MEQ/15ML (10%) PO LIQD
20.0000 meq | ORAL | Status: AC
  Administered 2013-12-08 (×3): 20 meq via ORAL
  Filled 2013-12-08 (×3): qty 15

## 2013-12-08 NOTE — Progress Notes (Signed)
Clinical Social Work Department CLINICAL SOCIAL WORK PLACEMENT NOTE 12/08/2013  Patient:  SHEQUITA, PEPLINSKI  Account Number:  [id number] Admit date:  12/05/2013  Clinical Social Worker:  Werner Lean, LCSW  Date/time:  12/06/2013 03:18 PM  Clinical Social Work is seeking post-discharge placement for this patient at the following level of care:   SKILLED NURSING   (*CSW will update this form in Epic as items are completed)     Patient/family provided with [facility] Department of Clinical Social Work's list of facilities offering this level of care within the geographic area requested by the patient (or if unable, by the patient's family).  12/06/2013  Patient/family informed of their freedom to choose among providers that offer the needed level of care, that participate in Medicare, Medicaid or managed care program needed by the patient, have an available bed and are willing to accept the patient.    Patient/family informed of [facility]' ownership interest in [facility], as well as of the fact that they are under no obligation to receive care at this facility.  PASARR submitted to EDS on 12/06/2013 PASARR number received on 12/06/2013  FL2 transmitted to all facilities in geographic area requested by pt/family on  12/06/2013 FL2 transmitted to all facilities within larger geographic area on   Patient informed that his/her managed care company has contracts with or will negotiate with  certain facilities, including the following:     Patient/family informed of bed offers received:  12/06/2013 Patient chooses bed at [facility] Physician recommends and patient chooses bed at    Patient to be transferred to [facility] on  12/08/2013 Patient to be transferred to facility by [facility] Patient and family notified of transfer on 12/08/2013 Name of family member notified:  Spouse : Arnell Sieving  The following physician request were entered in Epic:   Additional  Comments: Pt / family are in agreement with d/c to SNF today. NSG has reviewed d/c summary, avs, csripts. Scripts have been included in d/c packet.  Werner Lean LCSW [phone]

## 2013-12-08 NOTE — Care Management Note (Signed)
    Page 1 of 1   12/08/2013     1:18:17 PM CARE MANAGEMENT NOTE 12/08/2013  Patient:  [name], [name]   Account Number:  [id number]  Date Initiated:  12/08/2013  Documentation initiated by:  Sunday Spillers  Subjective/Objective Assessment:   80 yo female admitted with avulsion injury left hip. PTA lived at home.     Action/Plan:   SNF for rehab   Anticipated DC Date:  12/08/2013   Anticipated DC Plan:  SKILLED NURSING FACILITY  In-house referral  Clinical Social Worker      DC Planning Services  CM consult      Choice offered to / List presented to:             Status of service:  Completed, signed off Medicare Important Message given?  NA - LOS <3 / Initial given by admissions (If response is "NO", the following Medicare IM given date fields will be blank) Date Medicare IM given:   Date Additional Medicare IM given:    Discharge Disposition:  [facility]  Per UR Regulation:  Reviewed for med. necessity/level of care/duration of stay  If discussed at [place] of Stay Meetings, dates discussed:    Comments:

## 2013-12-08 NOTE — Progress Notes (Deleted)
Patient's lab results revealed potassium level 2.8. Tylene Fantasia, hospitalist notified.

## 2013-12-08 NOTE — Discharge Summary (Addendum)
Physician Discharge Summary  [name] RDE:[id number] DOB: 16-Apr-1930 DOA: 12/05/2013  PCP: Renato Shin, MD  Admit date: 12/05/2013 Discharge date: 12/08/2013  Recommendations for Outpatient Follow-up:  1. continue ciprofloxacin for 3 more days for urinary tract infection. Please note that the patient was on Rocephin throughout the hospital stay and has received a total of 4 days of Rocephin. 2. Please stop torsemide due to renal insufficiency. Creatinine is 1.94 at the time of discharge so recheck kidney function in 2-3 days. Renal insufficiency likely worsened with torsemide. Once her renal function improves you may consider restarting this medication. Since patient will stop taking torsemide she does not require as high of potassium supplementation. She can continue potassium 20 mEq twice daily for next 5 days. Recheck potassium and supplement if needed. 3. Please provide Air mattress  Discharge Diagnoses:  Principal Problem:   Avulsion injury of left iliopsoas and hematoma Active Problems:   DM (diabetes mellitus), type 2, uncontrolled, with renal complications   OBSTRUCTIVE SLEEP APNEA   HYPERTENSION   CORONARY ARTERY DISEASE   Chronic combined systolic and diastolic heart failure   COPD   GERD   Lower limb amputation, below knee   UTI (urinary tract infection)   Left hip pain    Discharge Condition: stable   Diet recommendation: as tolerated   History of present illness:  80 y.o. female with diabetes mellitus, hypertension, GERD, peripheral vascular disease, left BKA, diastolic dysfunction, obstructive sleep apnea, COPD, admitted 12/05/13 with left hip pain. No fall/injury.   Assessment/Plan:    Principal Problem:  Avulsion injury of left iliopsoas and hematoma with left hip pain   CT scan of the abdomen shows avulsion injury of the left iliopsoas and hematoma versus spontaneous intramuscular hemorrhage.   Continue conservative management with pain control,  PT/OT, ice to affected area.   Aspirin resumed, no signs of active bleeding   Hemoglobin remains stable at 13, within normal limits  Active Problems:  DM (diabetes mellitus), type 2, uncontrolled, with renal and neurological complications   Continue Neurontin for diabetic neuropathy.   As prescribed, sliding scale insulin. OBSTRUCTIVE SLEEP APNEA   Continue nocturnal CPAP. HYPERTENSION   Continue hydralazine, labetalol, Zaroxolyn, and Imdur. Hold torsemide due to renal insufficiency.  Reasonable inpatient control  CORONARY ARTERY DISEASE   Continue aspirin, labetalol, Imdur and Zetia. Chronic combined systolic and diastolic heart failure   Well compensated. Weight 215 lbs  COPD  Continue albuterol and Dulera. GERD   Continue PPI Lower limb amputation, below knee   Stable  Chronic renal failure, stage IV  Creatinine as high as 2.4 in July 2014. Creatinine at the time of discharge is 1.9. Patient is on torsemide which would be reasonable to hold and see if renal function improves. Her creatinine on admission was 1.5 so there was a upward trend. Instruction provided to recheck kidney function in next few days to see if there is improvement with holding torsemide. UTI (urinary tract infection)  Urine cultures were not obtained at the time of the admission to guide antibiotic treatment. Previous urine cultures based on medical records review have grown Proteus mirabilis sensitive to pretty much every antibiotic except for Macrobid. Patient was on Rocephin during this hospital stay. We will switch to ciprofloxacin for 3 more days on discharge to complete a total of 7 days of antibiotic treatment. DVT Prophylaxis  Continue subcutaneous heparin while pt is in hospital    Code Status: Full.  Family Communication: Daughter at bedside.  Peripheral IV Procedures:   None. Medical Consultants:   None. None. Anti-Infectives:   Rocephin 6/21 --> 12/08/2013 Cipro on discharge for  4 days.    SignedLeisa Lenz, MD  [facility] 12/08/2013, 10:23 AM  Pager #: [phone]   Discharge Exam: Filed Vitals:   12/08/13 0556  BP: 155/80  Pulse: 60  Temp: 98.7 F (37.1 C)  Resp: 16   Filed Vitals:   12/08/13 0000 12/08/13 0400 12/08/13 0556 12/08/13 0805  BP:   155/80   Pulse:   60   Temp:   98.7 F (37.1 C)   TempSrc:   Oral   Resp: 16 16 16    Height:      Weight:      SpO2: 100% 99% 100% 100%   Exam:  General: Pt is alert, not in acute distress  Cardiovascular: Regular rate and rhythm, S1/S2 appreciated  Respiratory: bilateral air entry, no wheezing  Abdomen: Soft, non tender, bowel sounds present  Extremities: Left BKA  Neuro: No focal neurologic deficits   Discharge Instructions  Discharge Instructions   Call MD for:  difficulty breathing, headache or visual disturbances    Complete by:  As directed      Call MD for:  persistant dizziness or light-headedness    Complete by:  As directed      Call MD for:  persistant nausea and vomiting    Complete by:  As directed      Call MD for:  severe uncontrolled pain    Complete by:  As directed      Diet - low sodium heart healthy    Complete by:  As directed      Discharge instructions    Complete by:  As directed   1. continue ciprofloxacin for 3 more days for urinary tract infection. Please note that the patient was on Rocephin throughout the hospital stay and has received a total of 4 days of Rocephin. 2. Please stop torsemide due to renal insufficiency. Creatinine is 1.94 at the time of discharge so recheck kidney function in 2-3 days. Renal insufficiency likely worsened with torsemide. Once her renal function improves you may consider restarting this medication. Since patient will stop taking torsemide she does not require as high of potassium supplementation. She can continue potassium 20 mEq twice daily for next 5 days. Recheck potassium and supplement if needed.     Increase  activity slowly    Complete by:  As directed             Medication List    STOP taking these medications       torsemide 20 MG tablet  Commonly known as:  DEMADEX     zolpidem 5 MG tablet  Commonly known as:  AMBIEN      TAKE these medications       acetaminophen 500 MG tablet  Commonly known as:  TYLENOL  Take 500 mg by mouth every 6 (six) hours as needed for pain. For pain     albuterol 108 (90 BASE) MCG/ACT inhaler  Commonly known as:  PROVENTIL HFA;VENTOLIN HFA  Inhale 2 puffs into the lungs every 4 (four) hours as needed for wheezing or shortness of breath.     albuterol (2.5 MG/3ML) 0.083% nebulizer solution  Commonly known as:  PROVENTIL  Take 3 mLs (2.5 mg total) by nebulization every 4 (four) hours as needed for wheezing or shortness of breath.     allopurinol 100 MG tablet  Commonly known as:  ZYLOPRIM  Take 1 tablet (100 mg total) by mouth daily.     aspirin 325 MG tablet  Take 325 mg by mouth daily.     bisacodyl 5 MG EC tablet  Commonly known as:  DULCOLAX  Take 1 tablet (5 mg total) by mouth daily as needed for moderate constipation.     calcitRIOL 0.25 MCG capsule  Commonly known as:  ROCALTROL  Take 0.25 mcg by mouth daily.     ciprofloxacin 500 MG tablet  Commonly known as:  CIPRO  Take 1 tablet (500 mg total) by mouth 2 (two) times daily.     docusate sodium 100 MG capsule  Commonly known as:  COLACE  Take 100 mg by mouth 2 (two) times daily as needed for constipation.     ezetimibe 10 MG tablet  Commonly known as:  ZETIA  Take 1 tablet (10 mg total) by mouth every morning.     feeding supplement (GLUCERNA SHAKE) Liqd  Take 237 mLs by mouth 2 (two) times daily.     Fluticasone-Salmeterol 100-50 MCG/DOSE Aepb  Commonly known as:  ADVAIR DISKUS  Inhale 1 puff into the lungs 2 (two) times daily.     gabapentin 300 MG capsule  Commonly known as:  NEURONTIN  Take 1 capsule (300 mg total) by mouth at bedtime.     glucose blood test  strip  Commonly known as:  TRUETEST TEST  Use as instructed     hydrALAZINE 25 MG tablet  Commonly known as:  APRESOLINE  Take 1 tablet (25 mg total) by mouth 3 (three) times daily.     HYDROcodone-acetaminophen 10-325 MG per tablet  Commonly known as:  NORCO  Take 1 tablet by mouth every 6 (six) hours as needed.     insulin aspart 100 UNIT/ML injection  Commonly known as:  novoLOG  Inject 0-15 Units into the skin 3 (three) times daily with meals.     insulin NPH-regular Human (70-30) 100 UNIT/ML injection  Commonly known as:  NOVOLIN 70/30  Inject 10-25 Units into the skin 2 (two) times daily with a meal. Sliding Scale     isosorbide mononitrate 30 MG 24 hr tablet  Commonly known as:  IMDUR  Take 1 tablet (30 mg total) by mouth daily.     labetalol 100 MG tablet  Commonly known as:  NORMODYNE  Take 1 tablet (100 mg total) by mouth 2 (two) times daily.     latanoprost 0.005 % ophthalmic solution  Commonly known as:  XALATAN  Place 1 drop into both eyes at bedtime.     metolazone 2.5 MG tablet  Commonly known as:  ZAROXOLYN  Take 2.5 mg by mouth as directed. 2 daily     nitroGLYCERIN 0.4 MG SL tablet  Commonly known as:  NITROSTAT  Place 0.4 mg under the tongue every 5 (five) minutes as needed for chest pain.     pantoprazole 40 MG tablet  Commonly known as:  PROTONIX  Take 1 tablet (40 mg total) by mouth 2 (two) times daily.     polyethylene glycol packet  Commonly known as:  MIRALAX / GLYCOLAX  Take 17 g by mouth daily as needed for mild constipation.     potassium chloride SA 20 MEQ tablet  Commonly known as:  K-DUR,KLOR-CON  Take 1 tablet (20 mEq total) by mouth 2 (two) times daily.     sertraline 100 MG tablet  Commonly known as:  ZOLOFT  Take  1 tablet (100 mg total) by mouth daily.           Follow-up Information   Follow up with Renato Shin, MD. Schedule an appointment as soon as possible for a visit in 3 weeks. (Follow up appt after recent  hospitalization)    Specialty:  Endocrinology   Contact information:   [address] [place] [address] [phone]        The results of significant diagnostics from this hospitalization (including imaging, microbiology, ancillary and laboratory) are listed below for reference.    Significant Diagnostic Studies: Ct Abdomen Pelvis Wo Contrast  12/05/2013   CLINICAL DATA:  Left-sided abdominal pain extending into the left lower extremity. Left flank pain.  EXAM: CT ABDOMEN AND PELVIS WITHOUT CONTRAST  TECHNIQUE: Multidetector CT imaging of the abdomen and pelvis was performed following the standard protocol without IV contrast.  COMPARISON:  05/30/2012  FINDINGS: Mild atelectasis, right middle lobe.  Calcified mitral valve.  Small hiatal hernia, type 1.  Prior cholecystectomy.  The noncontrast CT appearance of the liver, spleen, pancreas, and adrenal glands is within normal limits. No hydronephrosis, renal, or ureteral calculi are identified. There are numerous vascular calcifications in the pelvis is. No bladder stone identified.  Aortoiliac atherosclerotic vascular disease. Vascular calcifications along the margins of the uterus noted.  Acetabular protrusio is present with considerable bony demineralization. Old right iliac crest deformity. levoconvex lumbar scoliosis. Grade 1 anterolisthesis at L3-4 and L4-5 noted. Multilevel facet arthropathy, probable foraminal narrowing on the right at L3-4 and L4-5 due to disc protrusions and facet arthropathy.  Abnormal thickening of the left iliacus and psoas muscle along the inferior pelvis and tracking into the upper leg, with expansion noted. There is a small distal calcification along this process near the lesser trochanter.  IMPRESSION: 1. Suspected left iliopsoas avulsion, with a small linear calcification along the suspected avulsion site from the left lesser trochanter, and with abnormal thickening favoring hematoma within the distal  iliopsoas. Alternatively this could represent spontaneous intramuscular hemorrhage in the setting of calcific tendinopathy rather than avulsion. 2. Numerous ancillary findings include lumbar spondylosis and degenerative disc disease causing foraminal impingement ; atherosclerosis; a small hiatal hernia ; minimal right middle lobe atelectasis; calcified mitral valve; protrusio acetabuli; and pelvic deformity due to old fractures.   Electronically Signed   By: Sherryl Barters M.D.   On: 12/05/2013 20:10    Microbiology: No results found for this or any previous visit (from the past 240 hour(s)).   Labs: Basic Metabolic Panel:  Recent Labs Lab 12/05/13 1853 12/08/13 0459  NA 142 139  K 4.7 2.8*  CL 103 92*  CO2 26 29  GLUCOSE 169* 195*  BUN 24* 40*  CREATININE 1.50* 1.94*  CALCIUM 9.0 8.7   Liver Function Tests:  Recent Labs Lab 12/05/13 1853  AST 21  ALT 9  ALKPHOS 89  BILITOT 0.4  PROT 6.7  ALBUMIN 3.4*   No results found for this basename: LIPASE, AMYLASE,  in the last 168 hours No results found for this basename: AMMONIA,  in the last 168 hours CBC:  Recent Labs Lab 12/05/13 1853 12/08/13 0459  WBC 8.3 7.7  HGB 13.5 13.0  HCT 43.0 42.1  MCV 85.7 86.1  PLT 294 264   Cardiac Enzymes: No results found for this basename: CKTOTAL, CKMB, CKMBINDEX, TROPONINI,  in the last 168 hours BNP: BNP (last 3 results)  Recent Labs  07/12/13 1639 08/05/13 0936 11/15/13 1205  PROBNP 465.0* 400.0* 158.0*   CBG:  Recent Labs Lab 12/07/13 0717 12/07/13 1208 12/07/13 1703 12/07/13 2209 12/08/13 0732  GLUCAP 199* 213* 212* 220* 201*    Time coordinating discharge: Over 30 minutes

## 2013-12-08 NOTE — Discharge Instructions (Signed)

## 2013-12-08 NOTE — Progress Notes (Signed)
Report called to Hinda Glatter, LPN at [facility].

## 2013-12-08 NOTE — Progress Notes (Signed)
[name], hospitalist notified of low potassium 2.8.

## 2013-12-09 ENCOUNTER — Non-Acute Institutional Stay (SKILLED_NURSING_FACILITY): Payer: Medicare Other | Admitting: Adult Health

## 2013-12-09 ENCOUNTER — Other Ambulatory Visit: Payer: Self-pay | Admitting: *Deleted

## 2013-12-09 ENCOUNTER — Encounter: Payer: Self-pay | Admitting: Adult Health

## 2013-12-09 DIAGNOSIS — I10 Essential (primary) hypertension: Secondary | ICD-10-CM

## 2013-12-09 DIAGNOSIS — E1129 Type 2 diabetes mellitus with other diabetic kidney complication: Secondary | ICD-10-CM

## 2013-12-09 DIAGNOSIS — E1169 Type 2 diabetes mellitus with other specified complication: Secondary | ICD-10-CM

## 2013-12-09 DIAGNOSIS — M109 Gout, unspecified: Secondary | ICD-10-CM | POA: Insufficient documentation

## 2013-12-09 DIAGNOSIS — K219 Gastro-esophageal reflux disease without esophagitis: Secondary | ICD-10-CM

## 2013-12-09 DIAGNOSIS — F3289 Other specified depressive episodes: Secondary | ICD-10-CM

## 2013-12-09 DIAGNOSIS — G609 Hereditary and idiopathic neuropathy, unspecified: Secondary | ICD-10-CM

## 2013-12-09 DIAGNOSIS — J449 Chronic obstructive pulmonary disease, unspecified: Secondary | ICD-10-CM

## 2013-12-09 DIAGNOSIS — N259 Disorder resulting from impaired renal tubular function, unspecified: Secondary | ICD-10-CM

## 2013-12-09 DIAGNOSIS — S88119A Complete traumatic amputation at level between knee and ankle, unspecified lower leg, initial encounter: Secondary | ICD-10-CM

## 2013-12-09 DIAGNOSIS — I2510 Atherosclerotic heart disease of native coronary artery without angina pectoris: Secondary | ICD-10-CM

## 2013-12-09 DIAGNOSIS — E1159 Type 2 diabetes mellitus with other circulatory complications: Secondary | ICD-10-CM

## 2013-12-09 DIAGNOSIS — F329 Major depressive disorder, single episode, unspecified: Secondary | ICD-10-CM

## 2013-12-09 DIAGNOSIS — T148XXA Other injury of unspecified body region, initial encounter: Secondary | ICD-10-CM

## 2013-12-09 DIAGNOSIS — I152 Hypertension secondary to endocrine disorders: Secondary | ICD-10-CM

## 2013-12-09 DIAGNOSIS — E785 Hyperlipidemia, unspecified: Secondary | ICD-10-CM

## 2013-12-09 DIAGNOSIS — I5042 Chronic combined systolic (congestive) and diastolic (congestive) heart failure: Secondary | ICD-10-CM

## 2013-12-09 DIAGNOSIS — E1165 Type 2 diabetes mellitus with hyperglycemia: Secondary | ICD-10-CM

## 2013-12-09 DIAGNOSIS — IMO0002 Reserved for concepts with insufficient information to code with codable children: Secondary | ICD-10-CM

## 2013-12-09 MED ORDER — HYDROCODONE-ACETAMINOPHEN 10-325 MG PO TABS: ORAL_TABLET | ORAL | Status: DC

## 2013-12-09 MED ORDER — METOLAZONE 2.5 MG PO TABS: 2.5000 mg | ORAL_TABLET | ORAL | Status: DC

## 2013-12-09 NOTE — Progress Notes (Signed)
Patient ID: [name], female   DOB: December 21, 1929, 80 y.o.   MRN: [id number]     [facility]  Allergies  Allergen Reactions  . Iohexol      Code: RASH, Desc: [place] ON PT'S CHART ALLERGIC TO IV DYE 09/04/07/RM, Onset Date: [id number]      Chief Complaint  Patient presents with  . Hospitalization Follow-up    HPI:  She has been hospitalized for an avulsion of her left iliopsoas muscle. She had developed significant left hip and upper leg pain and was unable to stand on her prosthesis. She went to the hospital for further workup was found to have a torn muscle. She is here for short term rehab; for muscle healing. Her goal is to return back home.    Past Medical History  Diagnosis Date  . Uterine cancer   . Diabetes mellitus     type II; peripheral neuropathy  . Hypertension   . GERD (gastroesophageal reflux disease)   . Peripheral vascular disease     s/p L BKA 08/2012  . Obesity   . Dyslipidemia   . Diastolic CHF, chronic     EF 50-55%, mild LVH and grade 1 diast. Dysfxn  . Osteoarthritis cervical spine   . DM retinopathy   . Sleep apnea     with CPAP  . History of shingles   . COPD (chronic obstructive pulmonary disease)   . Depression   . Peptic ulcer disease     duodenal  . Peptic stricture of esophagus   . Hiatal hernia   . Diverticulosis   . Arthritis   . ASCVD (arteriosclerotic cardiovascular disease)     on MRI brin  . CAD (coronary artery disease)     a. s/p multiple caths with nonobs CAD;   b. cath 1/10: pLAD 20%, mLAD 40%, pCFX 20%, mCFX 40%, pRCA 60-70%;   c.  Myoview 06/02/12: Low anterior wall scar, no ischemia, EF 37%  . Cardiomyopathy     a. Echo 05/31/12: Severe LVH, EF 40-45%, diffuse HK, grade 1 diastolic dysfunction, mild to moderate calcified aortic valve annulus, mild to moderate aortic stenosis, MAC, mild MR, moderate LAE  . Asthma     Mild  . Pruritic condition     Idiopathic  . Hyperlipidemia   . Zoster   .  Noncompliance   . CKD (chronic kidney disease)     Dr. Lorrene Reid    Past Surgical History  Procedure Laterality Date  . Tubal ligation  1967  . Knee arthroscopy  10/1998    Left  . Craniotomy  1997    Left for SDH  . Cataract extraction, bilateral  2005  . Hernia repair    . Esophagogastroduodenoscopy  04/04/2004  . Spine surgery      C-spine and lumbar surgery  . Cholecystectomy  2010  . Cardiac catheterization    . Dexa  7/05  . Abdominal hysterectomy    . Amputation Left 09/04/2012    Procedure: AMPUTATION BELOW KNEE;  Surgeon: Angelia Mould, MD;  Location: [facility] OR;  Service: Vascular;  Laterality: Left;    VITAL SIGNS BP 142/80  Pulse 58  Ht 5\' 6"  (1.676 m)  Wt 203 lb 4.8 oz (92.216 kg)  BMI 32.83 kg/m2   Patient's Medications  New Prescriptions   No medications on file  Previous Medications   ACETAMINOPHEN (TYLENOL) 500 MG TABLET    Take 500 mg by mouth every 6 (six) hours as needed for  pain. For pain   ALBUTEROL (PROVENTIL HFA;VENTOLIN HFA) 108 (90 BASE) MCG/ACT INHALER    Inhale 2 puffs into the lungs every 4 (four) hours as needed for wheezing or shortness of breath.   ALBUTEROL (PROVENTIL) (2.5 MG/3ML) 0.083% NEBULIZER SOLUTION    Take 3 mLs (2.5 mg total) by nebulization every 4 (four) hours as needed for wheezing or shortness of breath.   ALLOPURINOL (ZYLOPRIM) 100 MG TABLET    Take 1 tablet (100 mg total) by mouth daily.   ASPIRIN 325 MG TABLET    Take 325 mg by mouth daily.   BISACODYL (DULCOLAX) 5 MG EC TABLET    Take 1 tablet (5 mg total) by mouth daily as needed for moderate constipation.   CALCITRIOL (ROCALTROL) 0.25 MCG CAPSULE    Take 0.25 mcg by mouth daily.    CIPROFLOXACIN (CIPRO) 500 MG TABLET    Take 1 tablet (500 mg total) by mouth 2 (two) times daily.   DOCUSATE SODIUM (COLACE) 100 MG CAPSULE    Take 100 mg by mouth 2 (two) times daily as needed for constipation.    EZETIMIBE (ZETIA) 10 MG TABLET    Take 1 tablet (10 mg total) by mouth every  morning.   FEEDING SUPPLEMENT (GLUCERNA SHAKE) LIQD    Take 237 mLs by mouth 2 (two) times daily.   FLUTICASONE-SALMETEROL (ADVAIR DISKUS) 100-50 MCG/DOSE AEPB    Inhale 1 puff into the lungs 2 (two) times daily.   GABAPENTIN (NEURONTIN) 300 MG CAPSULE    Take 1 capsule (300 mg total) by mouth at bedtime.   GLUCOSE BLOOD (TRUETEST TEST) TEST STRIP    Use as instructed   HYDRALAZINE (APRESOLINE) 25 MG TABLET    Take 1 tablet (25 mg total) by mouth 3 (three) times daily.   HYDROCODONE-ACETAMINOPHEN (NORCO) 10-325 MG PER TABLET    Take one tablet by mouth every 6 hours as needed for pain   INSULIN NPH-REGULAR HUMAN (NOVOLIN 70/30) (70-30) 100 UNIT/ML INJECTION    Inject 10 Units into the skin 2 (two) times daily with a meal. Sliding Scale   ISOSORBIDE MONONITRATE (IMDUR) 30 MG 24 HR TABLET    Take 1 tablet (30 mg total) by mouth daily.   LABETALOL (NORMODYNE) 100 MG TABLET    Take 1 tablet (100 mg total) by mouth 2 (two) times daily.   LATANOPROST (XALATAN) 0.005 % OPHTHALMIC SOLUTION    Place 1 drop into both eyes at bedtime.   METOLAZONE (ZAROXOLYN) 2.5 MG TABLET    Take 2.5 mg by mouth as directed. 2 daily   NITROGLYCERIN (NITROSTAT) 0.4 MG SL TABLET    Place 0.4 mg under the tongue every 5 (five) minutes as needed for chest pain.    PANTOPRAZOLE (PROTONIX) 40 MG TABLET    Take 1 tablet (40 mg total) by mouth 2 (two) times daily.   POLYETHYLENE GLYCOL (MIRALAX / GLYCOLAX) PACKET    Take 17 g by mouth daily as needed for mild constipation.   POTASSIUM CHLORIDE SA (K-DUR,KLOR-CON) 20 MEQ TABLET    Take 1 tablet (20 mEq total) by mouth 2 (two) times daily.   SERTRALINE (ZOLOFT) 100 MG TABLET    Take 1 tablet (100 mg total) by mouth daily.  Modified Medications   Modified Medication Previous Medication   INSULIN ASPART (NOVOLOG) 100 UNIT/ML INJECTION insulin aspart (NOVOLOG) 100 UNIT/ML injection      Inject 5 Units into the skin 3 (three) times daily with meals. For cbg >=150  Inject 0-15 Units  into the skin 3 (three) times daily with meals.  Discontinued Medications   No medications on file    SIGNIFICANT DIAGNOSTIC EXAMS  11-26-13: ct of abdomen and pelvis: 1. Suspected left iliopsoas avulsion, with a small linear calcification along the suspected avulsion site from the left lesser trochanter, and with abnormal thickening favoring hematoma within the distal iliopsoas. Alternatively this could represent spontaneous intramuscular hemorrhage in the setting of calcific tendinopathy rather than avulsion. 2. Numerous ancillary findings include lumbar spondylosis and degenerative disc disease causing foraminal impingement ; atherosclerosis; a small hiatal hernia ; minimal right middle lobe atelectasis; calcified mitral valve; protrusio acetabuli; and pelvic deformity due to old fractures.    LABS REVIEWED:   12-05-13: wbc 8.3; hgb 13.5; hct 43.0; mcv 85.7; plt 294; glucose 169; bun 24; creat 1.5; k+4.7; na++142 liver normal albumin 3.4 12-08-13: wbc 7.7; hgb 13.0; hct 42.1; mcv 86.1; plt 264; glucose 195; bun 40; creat 1.94; k+2.8; na++139     Review of Systems  Constitutional: Negative for malaise/fatigue.  Respiratory: Negative for cough and shortness of breath.   Cardiovascular: Negative for chest pain, palpitations and leg swelling.  Gastrointestinal: Negative for heartburn, abdominal pain and constipation.  Musculoskeletal: Negative for joint pain and myalgias.       Her pain is currently being managed   Skin: Negative.   Psychiatric/Behavioral: Negative for depression. The patient is not nervous/anxious.      Physical Exam  Constitutional: She is oriented to person, place, and time. She appears well-developed and well-nourished. No distress.  obese  Neck: Neck supple. No JVD present. No thyromegaly present.  Cardiovascular: Normal rate, regular rhythm and intact distal pulses.   Her left foot is tender to touch; this is chronic   Respiratory: Effort normal and breath  sounds normal. No respiratory distress. She has no wheezes.  GI: Soft. Bowel sounds are normal. She exhibits no distension. There is no tenderness.  Musculoskeletal: She exhibits no edema.  Left bka Is able to move all extremities   Neurological: She is alert and oriented to person, place, and time.  Skin: Skin is warm and dry. She is not diaphoretic.  Psychiatric: She has a normal mood and affect.       ASSESSMENT/ PLAN:  1. Avulsion injury left iliopsoas and hematoma: she is presently stable; will continue therapy as directed; will continue percocet 10/325 mg every 4 hours as needed. In light of her albumin 3.4 will being prostat 30 cc twice daily and will continue to monitor her status   2. COPD: she is presently stable will continue albuterol every 4 hours as needed; advair 100/50 twice daily and will monitor her status   3. Chronic combined systolic and diastolic heart failure: is presently stable will continue zaroxolyn 2.5 mg daily; her demadex is presently on hold due to her renal function; will continue Normodyne 100 mg twice daily and will continue to monitor her status   4, CAD: she is stable no complaints of chest pain present will continue imdur 30 mg daily; asa 325 mg daily and ntg prn will monitor her status   5. Hypertension: is presently stable will continue apresoline 25 mg three times daily; normodyne 100 mg twice daily;   6. Dyslipidemia: will continue zetia 10 mg daily   7. Diabetes: will continue novolog 5 units prior to meals for cbg>=150; novolin 70/30 10 units twice daily with meals; will continue to check cbg three times daily and will monitor her  status   8. Hypokalemia: will continue k+20 meq twice daily is due for follow up labs  9. Gerd: will continue protonix 40 mg twice daily   10. Peripheral neuropathy: is stable her right foot is tender to touch; will continue neurontin 300 mg nightly   11. Gout: will continue allopurinol 100 mg daily   12. Renal  insuffiencey: her renal labs are pending; will continue her calcitrol 0.25 mg daily   13. UTI: will continue cipro 500 mg twice daily for a total of 3 days; no culture was done in the hospital  14. Constipation: will continue dulcolax 5 mg daily; colace twice daily as needed; miralax daily prn   13. Depression: is stable at this time; will continue zoloft 100 mg daily   14. Glaucoma: will continue xalatan to both eyes nightly     Time spent with patient 50 minutes.          Ok Edwards NP [facility]  Contact [phone] Monday through Friday 8am- 5pm  After hours call [phone]

## 2013-12-09 NOTE — Telephone Encounter (Signed)
[facility] 

## 2013-12-13 ENCOUNTER — Encounter: Payer: Medicare Other | Admitting: Physical Therapy

## 2013-12-13 ENCOUNTER — Non-Acute Institutional Stay (SKILLED_NURSING_FACILITY): Payer: Medicare Other | Admitting: Internal Medicine

## 2013-12-13 DIAGNOSIS — K219 Gastro-esophageal reflux disease without esophagitis: Secondary | ICD-10-CM

## 2013-12-13 DIAGNOSIS — T148XXA Other injury of unspecified body region, initial encounter: Secondary | ICD-10-CM

## 2013-12-13 DIAGNOSIS — G609 Hereditary and idiopathic neuropathy, unspecified: Secondary | ICD-10-CM

## 2013-12-13 DIAGNOSIS — I5042 Chronic combined systolic (congestive) and diastolic (congestive) heart failure: Secondary | ICD-10-CM

## 2013-12-13 DIAGNOSIS — F329 Major depressive disorder, single episode, unspecified: Secondary | ICD-10-CM

## 2013-12-13 DIAGNOSIS — R5381 Other malaise: Secondary | ICD-10-CM

## 2013-12-13 DIAGNOSIS — J4489 Other specified chronic obstructive pulmonary disease: Secondary | ICD-10-CM

## 2013-12-13 DIAGNOSIS — N179 Acute kidney failure, unspecified: Secondary | ICD-10-CM

## 2013-12-13 DIAGNOSIS — I110 Hypertensive heart disease with heart failure: Secondary | ICD-10-CM

## 2013-12-13 DIAGNOSIS — R531 Weakness: Secondary | ICD-10-CM

## 2013-12-13 DIAGNOSIS — R5383 Other fatigue: Secondary | ICD-10-CM

## 2013-12-13 DIAGNOSIS — F3289 Other specified depressive episodes: Secondary | ICD-10-CM

## 2013-12-13 DIAGNOSIS — I509 Heart failure, unspecified: Secondary | ICD-10-CM

## 2013-12-13 DIAGNOSIS — I798 Other disorders of arteries, arterioles and capillaries in diseases classified elsewhere: Secondary | ICD-10-CM

## 2013-12-13 DIAGNOSIS — N189 Chronic kidney disease, unspecified: Secondary | ICD-10-CM

## 2013-12-13 DIAGNOSIS — G4733 Obstructive sleep apnea (adult) (pediatric): Secondary | ICD-10-CM

## 2013-12-13 DIAGNOSIS — E1351 Other specified diabetes mellitus with diabetic peripheral angiopathy without gangrene: Secondary | ICD-10-CM

## 2013-12-13 DIAGNOSIS — J449 Chronic obstructive pulmonary disease, unspecified: Secondary | ICD-10-CM

## 2013-12-13 DIAGNOSIS — N390 Urinary tract infection, site not specified: Secondary | ICD-10-CM

## 2013-12-13 DIAGNOSIS — E876 Hypokalemia: Secondary | ICD-10-CM

## 2013-12-13 NOTE — Progress Notes (Signed)
Patient ID: [name], female   DOB: 1930/02/08, 80 y.o.   MRN: [id number]     Facility: [facility]    PCP: Renato Shin, MD  Code Status: full code  Allergies  Allergen Reactions  . Iohexol      Code: RASH, Desc: [place] ON PT'S CHART ALLERGIC TO IV DYE 09/04/07/RM, Onset Date: [id number]     Chief Complaint: new admission  HPI:  80 y/o female patient is here for STR after hospital admission from 12/05/13-12/08/13 with left hip pain and noted to have avulsion injury to left iliopsoas and hematoma. This was conservatively managed with pain medication. Her hemoglobin/ hematocrit remained stable. She was also treated for UTI. With her worsening kidney function her torsemide was held. She has history of diabetes mellitus, hypertension, GERD, peripheral vascular disease, left BKA, diastolic dysfunction, obstructive sleep apnea, COPD. She was seen in her room today and is in no distress. Denies any complaints  Review of Systems:  Constitutional: Negative for fever, chills, malaise/fatigue  HENT: Negative for congestion, hearing loss and sore throat.   Eyes: Negative for eye pain, blurred vision, double vision and discharge.  Respiratory: Negative for cough, sputum production, shortness of breath and wheezing.   Cardiovascular: Negative for chest pain, palpitations, orthopnea and leg swelling.  Gastrointestinal: Negative for heartburn, nausea, vomiting, abdominal pain, diarrhea and constipation.  Genitourinary: Negative for dysuria, urgency, frequency Musculoskeletal: Negative for back pain Skin: Negative for itching and rash.  Neurological: Negative for dizziness, tingling, focal weakness and headaches.  Psychiatric/Behavioral: Negative for depression    Past Medical History  Diagnosis Date  . Uterine cancer   . Diabetes mellitus     type II; peripheral neuropathy  . Hypertension   . GERD (gastroesophageal reflux disease)   .  Peripheral vascular disease     s/p L BKA 08/2012  . Obesity   . Dyslipidemia   . Diastolic CHF, chronic     EF 50-55%, mild LVH and grade 1 diast. Dysfxn  . Osteoarthritis cervical spine   . DM retinopathy   . Sleep apnea     with CPAP  . History of shingles   . COPD (chronic obstructive pulmonary disease)   . Depression   . Peptic ulcer disease     duodenal  . Peptic stricture of esophagus   . Hiatal hernia   . Diverticulosis   . Arthritis   . ASCVD (arteriosclerotic cardiovascular disease)     on MRI brin  . CAD (coronary artery disease)     a. s/p multiple caths with nonobs CAD;   b. cath 1/10: pLAD 20%, mLAD 40%, pCFX 20%, mCFX 40%, pRCA 60-70%;   c.  Myoview 06/02/12: Low anterior wall scar, no ischemia, EF 37%  . Cardiomyopathy     a. Echo 05/31/12: Severe LVH, EF 40-45%, diffuse HK, grade 1 diastolic dysfunction, mild to moderate calcified aortic valve annulus, mild to moderate aortic stenosis, MAC, mild MR, moderate LAE  . Asthma     Mild  . Pruritic condition     Idiopathic  . Hyperlipidemia   . Zoster   . Noncompliance   . CKD (chronic kidney disease)     Dr. Lorrene Reid   Past Surgical History  Procedure Laterality Date  . Tubal ligation  1967  . Knee arthroscopy  10/1998    Left  . Craniotomy  1997    Left for SDH  . Cataract extraction, bilateral  2005  . Hernia repair    .  Esophagogastroduodenoscopy  04/04/2004  . Spine surgery      C-spine and lumbar surgery  . Cholecystectomy  2010  . Cardiac catheterization    . Dexa  7/05  . Abdominal hysterectomy    . Amputation Left 09/04/2012    Procedure: AMPUTATION BELOW KNEE;  Surgeon: Angelia Mould, MD;  Location: [place];  Service: Vascular;  Laterality: Left;   Social History:   reports that she has never smoked. She has never used smokeless tobacco. She reports that she does not drink alcohol or use illicit drugs.  Family History  Problem Relation Age of Onset  . Cancer Mother     "Stomach"  Cancer  . Diabetes Mother   . Heart disease Mother   . Stomach cancer Mother   . Hypertension Mother   . Lymphoma Father   . Hypertension Father   . Kidney disease Paternal Grandmother   . Asthma Other   . Diabetes Sister     Medications: Patient's Medications  New Prescriptions   No medications on file  Previous Medications   ACETAMINOPHEN (TYLENOL) 500 MG TABLET    Take 500 mg by mouth every 6 (six) hours as needed for pain. For pain   ALBUTEROL (PROVENTIL HFA;VENTOLIN HFA) 108 (90 BASE) MCG/ACT INHALER    Inhale 2 puffs into the lungs every 4 (four) hours as needed for wheezing or shortness of breath.   ALBUTEROL (PROVENTIL) (2.5 MG/3ML) 0.083% NEBULIZER SOLUTION    Take 3 mLs (2.5 mg total) by nebulization every 4 (four) hours as needed for wheezing or shortness of breath.   ALLOPURINOL (ZYLOPRIM) 100 MG TABLET    Take 1 tablet (100 mg total) by mouth daily.   ASPIRIN 325 MG TABLET    Take 325 mg by mouth daily.   BISACODYL (DULCOLAX) 5 MG EC TABLET    Take 1 tablet (5 mg total) by mouth daily as needed for moderate constipation.   CALCITRIOL (ROCALTROL) 0.25 MCG CAPSULE    Take 0.25 mcg by mouth daily.    CIPROFLOXACIN (CIPRO) 500 MG TABLET    Take 1 tablet (500 mg total) by mouth 2 (two) times daily.   DOCUSATE SODIUM (COLACE) 100 MG CAPSULE    Take 100 mg by mouth 2 (two) times daily as needed for constipation.    EZETIMIBE (ZETIA) 10 MG TABLET    Take 1 tablet (10 mg total) by mouth every morning.   FEEDING SUPPLEMENT (GLUCERNA SHAKE) LIQD    Take 237 mLs by mouth 2 (two) times daily.   FLUTICASONE-SALMETEROL (ADVAIR DISKUS) 100-50 MCG/DOSE AEPB    Inhale 1 puff into the lungs 2 (two) times daily.   GABAPENTIN (NEURONTIN) 300 MG CAPSULE    Take 1 capsule (300 mg total) by mouth at bedtime.   GLUCOSE BLOOD (TRUETEST TEST) TEST STRIP    Use as instructed   HYDRALAZINE (APRESOLINE) 25 MG TABLET    Take 1 tablet (25 mg total) by mouth 3 (three) times daily.    HYDROCODONE-ACETAMINOPHEN (NORCO) 10-325 MG PER TABLET    Take one tablet by mouth every 6 hours as needed for pain   INSULIN ASPART (NOVOLOG) 100 UNIT/ML INJECTION    Inject 5 Units into the skin 3 (three) times daily with meals. For cbg >=150   INSULIN NPH-REGULAR HUMAN (NOVOLIN 70/30) (70-30) 100 UNIT/ML INJECTION    Inject 10 Units into the skin 2 (two) times daily with a meal. Sliding Scale   ISOSORBIDE MONONITRATE (IMDUR) 30 MG 24 HR TABLET  Take 1 tablet (30 mg total) by mouth daily.   LABETALOL (NORMODYNE) 100 MG TABLET    Take 1 tablet (100 mg total) by mouth 2 (two) times daily.   LATANOPROST (XALATAN) 0.005 % OPHTHALMIC SOLUTION    Place 1 drop into both eyes at bedtime.   METOLAZONE (ZAROXOLYN) 2.5 MG TABLET    Take 1 tablet (2.5 mg total) by mouth as directed.   NITROGLYCERIN (NITROSTAT) 0.4 MG SL TABLET    Place 0.4 mg under the tongue every 5 (five) minutes as needed for chest pain.    PANTOPRAZOLE (PROTONIX) 40 MG TABLET    Take 1 tablet (40 mg total) by mouth 2 (two) times daily.   POLYETHYLENE GLYCOL (MIRALAX / GLYCOLAX) PACKET    Take 17 g by mouth daily as needed for mild constipation.   POTASSIUM CHLORIDE SA (K-DUR,KLOR-CON) 20 MEQ TABLET    Take 1 tablet (20 mEq total) by mouth 2 (two) times daily.   SERTRALINE (ZOLOFT) 100 MG TABLET    Take 1 tablet (100 mg total) by mouth daily.  Modified Medications   No medications on file  Discontinued Medications   No medications on file     Physical Exam: Filed Vitals:   12/13/13 1617  BP: 142/73  Pulse: 73  Temp: 97 F (36.1 C)  Resp: 16  SpO2: 96%    General- elderly female in no acute distress Head- atraumatic, normocephalic Eyes- PERRLA, EOMI, no pallor, no icterus, no discharge Neck- no lymphadenopathy Cardiovascular- normal s1,s2, no murmurs/ rubs/ gallops Respiratory- bilateral clear to auscultation, no wheeze, no rhonchi, no crackles, no use of accessory muscles Abdomen- bowel sounds present, soft, non  tender Musculoskeletal- able to move all 4 extremities, left BKA  Neurological- no focal deficit Skin- warm and dry Psychiatry- alert and oriented to person, place and time, normal mood and affect    Labs reviewed: Basic Metabolic Panel:  Recent Labs  11/15/13 1205 12/05/13 1853 12/08/13 0459  NA 143 142 139  K 4.6 4.7 2.8*  CL 106 103 92*  CO2 28 26 29   GLUCOSE 59* 169* 195*  BUN 22 24* 40*  CREATININE 1.7* 1.50* 1.94*  CALCIUM 9.0 9.0 8.7   Liver Function Tests:  Recent Labs  01/08/13 1525 12/05/13 1853  AST 31 21  ALT 18 9  ALKPHOS 62 89  BILITOT 0.5 0.4  PROT 7.4 6.7  ALBUMIN 4.1 3.4*   No results found for this basename: LIPASE, AMYLASE,  in the last 8760 hours No results found for this basename: AMMONIA,  in the last 8760 hours CBC:  Recent Labs  01/08/13 1525 12/05/13 1853 12/08/13 0459  WBC 7.8 8.3 7.7  NEUTROABS 5.1  --   --   HGB 13.2 13.5 13.0  HCT 40.2 43.0 42.1  MCV 88.0 85.7 86.1  PLT 264.0 294 264   Cardiac Enzymes: No results found for this basename: CKTOTAL, CKMB, CKMBINDEX, TROPONINI,  in the last 8760 hours BNP: No components found with this basename: POCBNP,  CBG:  Recent Labs  12/07/13 2209 12/08/13 0732 12/08/13 1148  GLUCAP 220* 201* 225*    Radiological Exams: Ct Abdomen Pelvis Wo Contrast  12/05/2013   CLINICAL DATA:  Left-sided abdominal pain extending into the left lower extremity. Left flank pain.  EXAM: CT ABDOMEN AND PELVIS WITHOUT CONTRAST  TECHNIQUE: Multidetector CT imaging of the abdomen and pelvis was performed following the standard protocol without IV contrast.  COMPARISON:  05/30/2012  FINDINGS: Mild atelectasis, right middle lobe.  Calcified mitral valve.  Small hiatal hernia, type 1.  Prior cholecystectomy.  The noncontrast CT appearance of the liver, spleen, pancreas, and adrenal glands is within normal limits. No hydronephrosis, renal, or ureteral calculi are identified. There are numerous vascular  calcifications in the pelvis is. No bladder stone identified.  Aortoiliac atherosclerotic vascular disease. Vascular calcifications along the margins of the uterus noted.  Acetabular protrusio is present with considerable bony demineralization. Old right iliac crest deformity. levoconvex lumbar scoliosis. Grade 1 anterolisthesis at L3-4 and L4-5 noted. Multilevel facet arthropathy, probable foraminal narrowing on the right at L3-4 and L4-5 due to disc protrusions and facet arthropathy.  Abnormal thickening of the left iliacus and psoas muscle along the inferior pelvis and tracking into the upper leg, with expansion noted. There is a small distal calcification along this process near the lesser trochanter.  IMPRESSION: 1. Suspected left iliopsoas avulsion, with a small linear calcification along the suspected avulsion site from the left lesser trochanter, and with abnormal thickening favoring hematoma within the distal iliopsoas. Alternatively this could represent spontaneous intramuscular hemorrhage in the setting of calcific tendinopathy rather than avulsion. 2. Numerous ancillary findings include lumbar spondylosis and degenerative disc disease causing foraminal impingement ; atherosclerosis; a small hiatal hernia ; minimal right middle lobe atelectasis; calcified mitral valve; protrusio acetabuli; and pelvic deformity due to old fractures.   Electronically Signed   By: Sherryl Barters M.D.   On: 12/05/2013 20:10    Assessment/Plan  Generalized weakness Will have patient work with PT/OT as tolerated to regain strength and restore function.  Fall precautions are in place.  Left iliopsoas injury Avulsion injury left iliopsoas and hematoma. Currently pain under control with current regimen. Monitor h&h. continue percocet 10/325 mg every 4 hours as needed. Continue to work with therapy team  uti Completed course of antibiotic and currently asymptomatic  Dm type 2 with vascular complication Monitor  cbg., continue novolog 5 units prior to meals and novolin 70/30 10 units twice daily with meals. continue aspirin   Acute on chronic renal disease Torsemide has been on hold. Monitor renal function. euvolemic at present. If needs diuretic will introduce in low dose  Hypertensive heart disease continue apresoline 25 mg three times daily, labetalol 100 mg twice daily, prn NTG and zetia  Diabetic neuropathy Continue gabapentin 300 mg daily  OSA Continue CPAP at bedtime  COPD continue advair 100/50 twice daily and albuterol every 4 hours as needed  Chronic combined systolic and diastolic heart failure continue zaroxolyn 2.5 mg daily, labetalol 100 mg twice daily. Monitor kidney function and weight with fluid restriction and if needed introduce diuretic in low dose  Hypokalemia Check bmp. continue kcl 20 meq twice daily   Jerrye Bushy continue protonix 40 mg twice daily   Depression continue zoloft 100 mg daily   Family/ staff Communication: reviewed care plan with patient and nursing supervisor   Goals of care: short term rehabilitation   Labs/tests ordered: cbc, bmp    Blanchie Serve, MD  [facility] [phone] (Monday-Friday 8 am - 5 pm) [phone] (afterhours)

## 2013-12-15 ENCOUNTER — Encounter: Payer: Medicare Other | Admitting: Physical Therapy

## 2013-12-16 ENCOUNTER — Telehealth: Payer: Self-pay | Admitting: *Deleted

## 2013-12-16 ENCOUNTER — Other Ambulatory Visit (INDEPENDENT_AMBULATORY_CARE_PROVIDER_SITE_OTHER): Payer: Medicare Other

## 2013-12-16 DIAGNOSIS — R0602 Shortness of breath: Secondary | ICD-10-CM

## 2013-12-16 DIAGNOSIS — I5042 Chronic combined systolic (congestive) and diastolic (congestive) heart failure: Secondary | ICD-10-CM

## 2013-12-16 DIAGNOSIS — N259 Disorder resulting from impaired renal tubular function, unspecified: Secondary | ICD-10-CM

## 2013-12-16 DIAGNOSIS — G4733 Obstructive sleep apnea (adult) (pediatric): Secondary | ICD-10-CM

## 2013-12-16 DIAGNOSIS — I2510 Atherosclerotic heart disease of native coronary artery without angina pectoris: Secondary | ICD-10-CM

## 2013-12-16 DIAGNOSIS — I10 Essential (primary) hypertension: Secondary | ICD-10-CM

## 2013-12-16 DIAGNOSIS — J449 Chronic obstructive pulmonary disease, unspecified: Secondary | ICD-10-CM

## 2013-12-16 LAB — BASIC METABOLIC PANEL
BUN: 39 mg/dL — ABNORMAL HIGH (ref 6–23)
CO2: 31 mEq/L (ref 19–32)
Calcium: 9.4 mg/dL (ref 8.4–10.5)
Chloride: 100 mEq/L (ref 96–112)
Creatinine, Ser: 1.5 mg/dL — ABNORMAL HIGH (ref 0.4–1.2)
GFR: 44.08 mL/min — ABNORMAL LOW (ref >60.00)
Glucose, Bld: 313 mg/dL — ABNORMAL HIGH (ref 70–99)
Potassium: 3.9 mEq/L (ref 3.5–5.1)
Sodium: 138 mEq/L (ref 135–145)

## 2013-12-16 NOTE — Telephone Encounter (Signed)
Received a report of consultation for Dr Meda Coffee to sign on 7/2 and no contact information on where to send back info was noted.  Pt is currently at a nursing home [facility].  Called the facility and nobody was available to talk with to get fax information.  Will continue to follow-up with the facility next week.

## 2013-12-20 ENCOUNTER — Encounter: Payer: Medicare Other | Admitting: Physical Therapy

## 2013-12-22 ENCOUNTER — Encounter: Payer: Medicare Other | Admitting: Physical Therapy

## 2013-12-30 ENCOUNTER — Encounter: Payer: Self-pay | Admitting: Endocrinology

## 2013-12-30 ENCOUNTER — Ambulatory Visit (INDEPENDENT_AMBULATORY_CARE_PROVIDER_SITE_OTHER): Payer: Medicare Other | Admitting: Endocrinology

## 2013-12-30 VITALS — BP 126/86 | HR 69 | Temp 97.9°F

## 2013-12-30 DIAGNOSIS — N3000 Acute cystitis without hematuria: Secondary | ICD-10-CM

## 2013-12-30 DIAGNOSIS — N259 Disorder resulting from impaired renal tubular function, unspecified: Secondary | ICD-10-CM

## 2013-12-30 LAB — URINALYSIS, ROUTINE W REFLEX MICROSCOPIC
Bilirubin Urine: NEGATIVE
Hgb urine dipstick: NEGATIVE
KETONES UR: NEGATIVE
Leukocytes, UA: NEGATIVE
Nitrite: NEGATIVE
PH: 6 (ref 5.0–8.0)
Specific Gravity, Urine: 1.015 (ref 1.000–1.030)
TOTAL PROTEIN, URINE-UPE24: 100 — AB
Urine Glucose: 100 — AB
Urobilinogen, UA: 0.2 (ref 0.0–1.0)

## 2013-12-30 LAB — BASIC METABOLIC PANEL
BUN: 30 mg/dL — ABNORMAL HIGH (ref 6–23)
CHLORIDE: 101 meq/L (ref 96–112)
CO2: 30 mEq/L (ref 19–32)
Calcium: 9.2 mg/dL (ref 8.4–10.5)
Creatinine, Ser: 1.4 mg/dL — ABNORMAL HIGH (ref 0.4–1.2)
GFR: 47.04 mL/min — ABNORMAL LOW (ref >60.00)
GLUCOSE: 225 mg/dL — AB (ref 70–99)
POTASSIUM: 3.8 meq/L (ref 3.5–5.1)
Sodium: 137 mEq/L (ref 135–145)

## 2013-12-30 NOTE — Patient Instructions (Signed)
Please continue the same medications for now. blood tests are being requested for you today.  We'll contact you with results. Please come back for a follow-up appointment in 1 month.

## 2013-12-30 NOTE — Progress Notes (Signed)
Subjective:    Patient ID: [name], female    DOB: 1929-06-21, 80 y.o.   MRN: [id number]  HPI The state of at least three ongoing medical problems is addressed today, with interval history of each noted here: Pt was recently in the hospital for avulsion thigh injury.  She expects to leave [facility] next week.  She is feeling better now.  Renal failure: edema is resolved. UTI: denies dysuria. DM: our office has called [facility], and obtained cbg record.  It is scanned into epic.  Past Medical History  Diagnosis Date  . Uterine cancer   . Diabetes mellitus     type II; peripheral neuropathy  . Hypertension   . GERD (gastroesophageal reflux disease)   . Peripheral vascular disease     s/p L BKA 08/2012  . Obesity   . Dyslipidemia   . Diastolic CHF, chronic     EF 50-55%, mild LVH and grade 1 diast. Dysfxn  . Osteoarthritis cervical spine   . DM retinopathy   . Sleep apnea     with CPAP  . History of shingles   . COPD (chronic obstructive pulmonary disease)   . Depression   . Peptic ulcer disease     duodenal  . Peptic stricture of esophagus   . Hiatal hernia   . Diverticulosis   . Arthritis   . ASCVD (arteriosclerotic cardiovascular disease)     on MRI brin  . CAD (coronary artery disease)     a. s/p multiple caths with nonobs CAD;   b. cath 1/10: pLAD 20%, mLAD 40%, pCFX 20%, mCFX 40%, pRCA 60-70%;   c.  Myoview 06/02/12: Low anterior wall scar, no ischemia, EF 37%  . Cardiomyopathy     a. Echo 05/31/12: Severe LVH, EF 40-45%, diffuse HK, grade 1 diastolic dysfunction, mild to moderate calcified aortic valve annulus, mild to moderate aortic stenosis, MAC, mild MR, moderate LAE  . Asthma     Mild  . Pruritic condition     Idiopathic  . Hyperlipidemia   . Zoster   . Noncompliance   . CKD (chronic kidney disease)     Dr. Lorrene Reid    Past Surgical History  Procedure Laterality Date  . Tubal ligation  1967  . Knee arthroscopy  10/1998    Left  .  Craniotomy  1997    Left for SDH  . Cataract extraction, bilateral  2005  . Hernia repair    . Esophagogastroduodenoscopy  04/04/2004  . Spine surgery      C-spine and lumbar surgery  . Cholecystectomy  2010  . Cardiac catheterization    . Dexa  7/05  . Abdominal hysterectomy    . Amputation Left 09/04/2012    Procedure: AMPUTATION BELOW KNEE;  Surgeon: Angelia Mould, MD;  Location: [facility];  Service: Vascular;  Laterality: Left;    History   Social History  . Marital Status: Married    Spouse Name: N/A    Number of Children: 7  . Years of Education: N/A   Occupational History  . Retired    Social History Main Topics  . Smoking status: Never Smoker   . Smokeless tobacco: Never Used  . Alcohol Use: No     Comment: rare  . Drug Use: No  . Sexual Activity: No   Other Topics Concern  . Not on file   Social History Narrative  . No narrative on file    Current Outpatient Prescriptions  on File Prior to Visit  Medication Sig Dispense Refill  . acetaminophen (TYLENOL) 500 MG tablet Take 500 mg by mouth every 6 (six) hours as needed for pain. For pain      . albuterol (PROVENTIL HFA;VENTOLIN HFA) 108 (90 BASE) MCG/ACT inhaler Inhale 2 puffs into the lungs every 4 (four) hours as needed for wheezing or shortness of breath.  1 Inhaler  3  . albuterol (PROVENTIL) (2.5 MG/3ML) 0.083% nebulizer solution Take 3 mLs (2.5 mg total) by nebulization every 4 (four) hours as needed for wheezing or shortness of breath.  75 mL  12  . allopurinol (ZYLOPRIM) 100 MG tablet Take 1 tablet (100 mg total) by mouth daily.  30 tablet  11  . aspirin 325 MG tablet Take 325 mg by mouth daily.      . bisacodyl (DULCOLAX) 5 MG EC tablet Take 1 tablet (5 mg total) by mouth daily as needed for moderate constipation.  30 tablet  0  . calcitRIOL (ROCALTROL) 0.25 MCG capsule Take 0.25 mcg by mouth daily.       . ciprofloxacin (CIPRO) 500 MG tablet Take 1 tablet (500 mg total) by mouth 2 (two) times  daily.  6 tablet  0  . docusate sodium (COLACE) 100 MG capsule Take 100 mg by mouth 2 (two) times daily as needed for constipation.       Marland Kitchen ezetimibe (ZETIA) 10 MG tablet Take 1 tablet (10 mg total) by mouth every morning.  30 tablet  11  . feeding supplement (GLUCERNA SHAKE) LIQD Take 237 mLs by mouth 2 (two) times daily.      . Fluticasone-Salmeterol (ADVAIR DISKUS) 100-50 MCG/DOSE AEPB Inhale 1 puff into the lungs 2 (two) times daily.  1 each  11  . gabapentin (NEURONTIN) 300 MG capsule Take 1 capsule (300 mg total) by mouth at bedtime.  30 capsule  11  . glucose blood (TRUETEST TEST) test strip Use as instructed  100 each  12  . hydrALAZINE (APRESOLINE) 25 MG tablet Take 1 tablet (25 mg total) by mouth 3 (three) times daily.  90 tablet  11  . HYDROcodone-acetaminophen (NORCO) 10-325 MG per tablet Take one tablet by mouth every 6 hours as needed for pain  120 tablet  0  . insulin aspart (NOVOLOG) 100 UNIT/ML injection Inject 5 Units into the skin 3 (three) times daily with meals. For cbg >=150      . insulin NPH-regular Human (NOVOLIN 70/30) (70-30) 100 UNIT/ML injection Inject 10 Units into the skin 2 (two) times daily with a meal. Sliding Scale      . isosorbide mononitrate (IMDUR) 30 MG 24 hr tablet Take 1 tablet (30 mg total) by mouth daily.  30 tablet  11  . labetalol (NORMODYNE) 100 MG tablet Take 1 tablet (100 mg total) by mouth 2 (two) times daily.  60 tablet  11  . latanoprost (XALATAN) 0.005 % ophthalmic solution Place 1 drop into both eyes at bedtime.      . nitroGLYCERIN (NITROSTAT) 0.4 MG SL tablet Place 0.4 mg under the tongue every 5 (five) minutes as needed for chest pain.       . pantoprazole (PROTONIX) 40 MG tablet Take 1 tablet (40 mg total) by mouth 2 (two) times daily.  60 tablet  11  . polyethylene glycol (MIRALAX / GLYCOLAX) packet Take 17 g by mouth daily as needed for mild constipation.  14 each  0  . potassium chloride SA (K-DUR,KLOR-CON) 20 MEQ tablet Take  1 tablet (20  mEq total) by mouth 2 (two) times daily.  10 tablet  0  . sertraline (ZOLOFT) 100 MG tablet Take 1 tablet (100 mg total) by mouth daily.  30 tablet  0   No current facility-administered medications on file prior to visit.    Allergies  Allergen Reactions  . Iohexol      Code: RASH, Desc: [place] ON PT'S CHART ALLERGIC TO IV DYE 09/04/07/RM, Onset Date: 15176160     Family History  Problem Relation Age of Onset  . Cancer Mother     "Stomach" Cancer  . Diabetes Mother   . Heart disease Mother   . Stomach cancer Mother   . Hypertension Mother   . Lymphoma Father   . Hypertension Father   . Kidney disease Paternal Grandmother   . Asthma Other   . Diabetes Sister     BP 126/86  Pulse 69  Temp(Src) 97.9 F (36.6 C) (Oral)  SpO2 96%   Review of Systems Denies fever and sob    Objective:   Physical Exam VITAL SIGNS:  See vs page GENERAL: no distress.  In wheelchair. Ext: right leg: no edema.     Lab Results  Component Value Date   CREATININE 1.4* 12/30/2013   BUN 30* 12/30/2013   NA 137 12/30/2013   K 3.8 12/30/2013   CL 101 12/30/2013   CO2 30 12/30/2013  UA: no UTI     Assessment & Plan:  Renal insufficiency: stable.  We'll hold off on resumption of demadex for now Hypokalemia: well-replaced.  Pt is advised to continue the same medication UTI: resolved.   Patient is advised the following: Patient Instructions  Please continue the same medications for now. blood tests are being requested for you today.  We'll contact you with results. Please come back for a follow-up appointment in 1 month.

## 2014-01-03 ENCOUNTER — Non-Acute Institutional Stay (SKILLED_NURSING_FACILITY): Payer: Medicare Other | Admitting: Adult Health

## 2014-01-03 ENCOUNTER — Other Ambulatory Visit: Payer: Self-pay | Admitting: Internal Medicine

## 2014-01-03 DIAGNOSIS — J449 Chronic obstructive pulmonary disease, unspecified: Secondary | ICD-10-CM

## 2014-01-03 DIAGNOSIS — M1990 Unspecified osteoarthritis, unspecified site: Secondary | ICD-10-CM

## 2014-01-03 DIAGNOSIS — I152 Hypertension secondary to endocrine disorders: Secondary | ICD-10-CM

## 2014-01-03 DIAGNOSIS — E1159 Type 2 diabetes mellitus with other circulatory complications: Secondary | ICD-10-CM

## 2014-01-03 DIAGNOSIS — S7012XS Contusion of left thigh, sequela: Secondary | ICD-10-CM

## 2014-01-03 DIAGNOSIS — I10 Essential (primary) hypertension: Secondary | ICD-10-CM

## 2014-01-03 DIAGNOSIS — T148XXA Other injury of unspecified body region, initial encounter: Secondary | ICD-10-CM

## 2014-01-03 DIAGNOSIS — J4489 Other specified chronic obstructive pulmonary disease: Secondary | ICD-10-CM

## 2014-01-03 DIAGNOSIS — E1169 Type 2 diabetes mellitus with other specified complication: Secondary | ICD-10-CM

## 2014-01-04 ENCOUNTER — Ambulatory Visit (HOSPITAL_COMMUNITY)
Admission: RE | Admit: 2014-01-04 | Discharge: 2014-01-04 | Disposition: A | Discharge status: Home / Self Care | Payer: Medicare Other | Source: Ambulatory Visit | Attending: Internal Medicine | Admitting: Internal Medicine

## 2014-01-04 ENCOUNTER — Encounter (HOSPITAL_COMMUNITY): Payer: Self-pay

## 2014-01-04 ENCOUNTER — Other Ambulatory Visit: Payer: Self-pay | Admitting: *Deleted

## 2014-01-04 DIAGNOSIS — M619 Calcification and ossification of muscle, unspecified: Secondary | ICD-10-CM | POA: Insufficient documentation

## 2014-01-04 DIAGNOSIS — M47817 Spondylosis without myelopathy or radiculopathy, lumbosacral region: Secondary | ICD-10-CM | POA: Insufficient documentation

## 2014-01-04 DIAGNOSIS — M25559 Pain in unspecified hip: Secondary | ICD-10-CM | POA: Insufficient documentation

## 2014-01-04 DIAGNOSIS — K5730 Diverticulosis of large intestine without perforation or abscess without bleeding: Secondary | ICD-10-CM | POA: Insufficient documentation

## 2014-01-04 DIAGNOSIS — I700 Atherosclerosis of aorta: Secondary | ICD-10-CM | POA: Insufficient documentation

## 2014-01-04 DIAGNOSIS — Z4789 Encounter for other orthopedic aftercare: Secondary | ICD-10-CM | POA: Insufficient documentation

## 2014-01-04 DIAGNOSIS — M479 Spondylosis, unspecified: Secondary | ICD-10-CM | POA: Insufficient documentation

## 2014-01-04 DIAGNOSIS — S7012XS Contusion of left thigh, sequela: Secondary | ICD-10-CM

## 2014-01-04 DIAGNOSIS — I7090 Unspecified atherosclerosis: Secondary | ICD-10-CM | POA: Insufficient documentation

## 2014-01-04 MED ORDER — HYDROCODONE-ACETAMINOPHEN 10-325 MG PO TABS: ORAL_TABLET | ORAL | Status: DC

## 2014-01-04 NOTE — Telephone Encounter (Signed)
[facility] 

## 2014-01-06 ENCOUNTER — Ambulatory Visit (HOSPITAL_COMMUNITY): Payer: Medicare Other

## 2014-01-10 ENCOUNTER — Encounter: Payer: Self-pay | Admitting: Adult Health

## 2014-01-10 NOTE — Progress Notes (Signed)
Patient ID: [name], female   DOB: [birth year], 80 y.o.   MRN: [id number]     [facility]  Allergies  Allergen Reactions  . Iohexol      Code: RASH, Desc: [place] ON PT'S CHART ALLERGIC TO IV DYE 09/04/07/RM, Onset Date: [id number]      Chief Complaint  Patient presents with  . Discharge Note    HPI:  She is being discharged to home. She had a fall over the weekend and she is having worsening left thigh and hip pain. She and her family are concerned that she may developed another muscle tear we will setup a ct scan for further examination.  She will need out patient therapy for pt/ot. She will not need dme. She will need prescriptions to be written.    Past Medical History  Diagnosis Date  . Uterine cancer   . Diabetes mellitus     type II; peripheral neuropathy  . Hypertension   . GERD (gastroesophageal reflux disease)   . Peripheral vascular disease     s/p L BKA 08/2012  . Obesity   . Dyslipidemia   . Diastolic CHF, chronic     EF 50-55%, mild LVH and grade 1 diast. Dysfxn  . Osteoarthritis cervical spine   . DM retinopathy   . Sleep apnea     with CPAP  . History of shingles   . COPD (chronic obstructive pulmonary disease)   . Depression   . Peptic ulcer disease     duodenal  . Peptic stricture of esophagus   . Hiatal hernia   . Diverticulosis   . Arthritis   . ASCVD (arteriosclerotic cardiovascular disease)     on MRI brin  . CAD (coronary artery disease)     a. s/p multiple caths with nonobs CAD;   b. cath 1/10: pLAD 20%, mLAD 40%, pCFX 20%, mCFX 40%, pRCA 60-70%;   c.  Myoview 06/02/12: Low anterior wall scar, no ischemia, EF 37%  . Cardiomyopathy     a. Echo 05/31/12: Severe LVH, EF 40-45%, diffuse HK, grade 1 diastolic dysfunction, mild to moderate calcified aortic valve annulus, mild to moderate aortic stenosis, MAC, mild MR, moderate LAE  . Asthma     Mild  . Pruritic condition     Idiopathic  . Hyperlipidemia   . Zoster   .  Noncompliance   . CKD (chronic kidney disease)     Dr. Lorrene Reid    Past Surgical History  Procedure Laterality Date  . Tubal ligation  1967  . Knee arthroscopy  10/1998    Left  . Craniotomy  1997    Left for SDH  . Cataract extraction, bilateral  2005  . Hernia repair    . Esophagogastroduodenoscopy  04/04/2004  . Spine surgery      C-spine and lumbar surgery  . Cholecystectomy  2010  . Cardiac catheterization    . Dexa  7/05  . Abdominal hysterectomy    . Amputation Left 09/04/2012    Procedure: AMPUTATION BELOW KNEE;  Surgeon: Angelia Mould, MD;  Location: [facility];  Service: Vascular;  Laterality: Left;    VITAL SIGNS BP 143/76  Pulse 69  Ht 5\' 6"  (1.676 m)  Wt 214 lb (97.07 kg)  BMI 34.56 kg/m2   Patient's Medications  New Prescriptions   No medications on file  Previous Medications   ACETAMINOPHEN (TYLENOL) 500 MG TABLET    Take 500 mg by mouth every 6 (six) hours as  needed for pain. For pain   ALBUTEROL (PROVENTIL HFA;VENTOLIN HFA) 108 (90 BASE) MCG/ACT INHALER    Inhale 2 puffs into the lungs every 4 (four) hours as needed for wheezing or shortness of breath.   ALBUTEROL (PROVENTIL) (2.5 MG/3ML) 0.083% NEBULIZER SOLUTION    Take 3 mLs (2.5 mg total) by nebulization every 4 (four) hours as needed for wheezing or shortness of breath.   ALLOPURINOL (ZYLOPRIM) 100 MG TABLET    Take 1 tablet (100 mg total) by mouth daily.   ASPIRIN 325 MG TABLET    Take 325 mg by mouth daily.   BISACODYL (DULCOLAX) 5 MG EC TABLET    Take 1 tablet (5 mg total) by mouth daily as needed for moderate constipation.   CALCITRIOL (ROCALTROL) 0.25 MCG CAPSULE    Take 0.25 mcg by mouth daily.    DOCUSATE SODIUM (COLACE) 100 MG CAPSULE    Take 100 mg by mouth 2 (two) times daily as needed for constipation.    EZETIMIBE (ZETIA) 10 MG TABLET    Take 1 tablet (10 mg total) by mouth every morning.   FEEDING SUPPLEMENT (GLUCERNA SHAKE) LIQD    Take 237 mLs by mouth 2 (two) times daily.    FLUTICASONE-SALMETEROL (ADVAIR DISKUS) 100-50 MCG/DOSE AEPB    Inhale 1 puff into the lungs 2 (two) times daily.   GABAPENTIN (NEURONTIN) 300 MG CAPSULE    Take 1 capsule (300 mg total) by mouth at bedtime.   GLUCOSE BLOOD (TRUETEST TEST) TEST STRIP    Use as instructed   HYDRALAZINE (APRESOLINE) 25 MG TABLET    Take 1 tablet (25 mg total) by mouth 3 (three) times daily.   HYDROCODONE-ACETAMINOPHEN (NORCO) 10-325 MG PER TABLET    Take one tablet by mouth every 6 hours as needed for pain   INSULIN ASPART (NOVOLOG) 100 UNIT/ML INJECTION    Inject 5 Units into the skin 3 (three) times daily with meals. For cbg >=150   INSULIN NPH-REGULAR HUMAN (NOVOLIN 70/30) (70-30) 100 UNIT/ML INJECTION    Inject 10 Units into the skin 2 (two) times daily with a meal. Sliding Scale   ISOSORBIDE MONONITRATE (IMDUR) 30 MG 24 HR TABLET    Take 1 tablet (30 mg total) by mouth daily.   LABETALOL (NORMODYNE) 100 MG TABLET    Take 1 tablet (100 mg total) by mouth 2 (two) times daily.   LATANOPROST (XALATAN) 0.005 % OPHTHALMIC SOLUTION    Place 1 drop into both eyes at bedtime.   NITROGLYCERIN (NITROSTAT) 0.4 MG SL TABLET    Place 0.4 mg under the tongue every 5 (five) minutes as needed for chest pain.    PANTOPRAZOLE (PROTONIX) 40 MG TABLET    Take 1 tablet (40 mg total) by mouth 2 (two) times daily.   POLYETHYLENE GLYCOL (MIRALAX / GLYCOLAX) PACKET    Take 17 g by mouth daily as needed for mild constipation.   POTASSIUM CHLORIDE SA (K-DUR,KLOR-CON) 20 MEQ TABLET    Take 1 tablet (20 mEq total) by mouth 2 (two) times daily.   SERTRALINE (ZOLOFT) 100 MG TABLET    Take 1 tablet (100 mg total) by mouth daily.  Modified Medications   No medications on file  Discontinued Medications   CIPROFLOXACIN (CIPRO) 500 MG TABLET    Take 1 tablet (500 mg total) by mouth 2 (two) times daily.    SIGNIFICANT DIAGNOSTIC EXAMS  11-26-13: ct of abdomen and pelvis: 1. Suspected left iliopsoas avulsion, with a small linear calcification  along the suspected avulsion site from the left lesser trochanter, and with abnormal thickening favoring hematoma within the distal iliopsoas. Alternatively this could represent spontaneous intramuscular hemorrhage in the setting of calcific tendinopathy rather than avulsion. 2. Numerous ancillary findings include lumbar spondylosis and degenerative disc disease causing foraminal impingement ; atherosclerosis; a small hiatal hernia ; minimal right middle lobe atelectasis; calcified mitral valve; protrusio acetabuli; and pelvic deformity due to old fractures.  01-02-14: left hip x-ray: 1. No acute fracture or dislocation seen. 2. Marked degenerative change.     LABS REVIEWED:   12-05-13: wbc 8.3; hgb 13.5; hct 43.0; mcv 85.7; plt 294; glucose 169; bun 24; creat 1.5; k+4.7; na++142 liver normal albumin 3.4 12-08-13: wbc 7.7; hgb 13.0; hct 42.1; mcv 86.1; plt 264; glucose 195; bun 40; creat 1.94; k+2.8; na++139     Review of Systems  Constitutional: Negative for malaise/fatigue.  Respiratory: Negative for cough and shortness of breath.   Cardiovascular: Negative for chest pain, palpitations and leg swelling.  Gastrointestinal: Negative for heartburn, abdominal pain and constipation.  Musculoskeletal: Negative for joint pain and myalgias.       Her pain is not currently being managed   Skin: Negative.   Psychiatric/Behavioral: Negative for depression. The patient is not nervous/anxious.      Physical Exam  Constitutional: She is oriented to person, place, and time. She appears well-developed and well-nourished. No distress.  obese  Neck: Neck supple. No JVD present. No thyromegaly present.  Cardiovascular: Normal rate, regular rhythm and intact distal pulses.   Her right foot is tender to touch; this is chronic   Respiratory: Effort normal and breath sounds normal. No respiratory distress. She has no wheezes.  GI: Soft. Bowel sounds are normal. She exhibits no distension. There is no  tenderness.  Musculoskeletal: She exhibits no edema.  Left bka; has tenderness to her left thigh and hip to palpation  Is able to move all extremities   Neurological: She is alert and oriented to person, place, and time.  Skin: Skin is warm and dry. She is not diaphoretic.  Psychiatric: She has a normal mood and affect.      ASSESSMENT/ PLAN:  At this time we will plan on her being discharged on Monday 01-10-14. We will setup a ct scan of abdomen and pelvis to re-evaluate her thigh. If there is no worsening of her tear will proceed with her discharge. Will change her vicodin to every 4 hours as needed for pain and will monitor her status.   She will not need home health she will need out patient therapy  Pt/ot for prosthetic work and gait. She will not need dme  Her follow up appoint is with Dr. Wynell Balloon 01-25-14 at 2pm.   Her prescriptions have been written for a 30 day supply of her medications and she was given a prescription of vicodin 10/325 mg # 40.     Time spent with patient 50 minutes.

## 2014-01-11 ENCOUNTER — Encounter: Payer: Self-pay | Admitting: Endocrinology

## 2014-01-11 LAB — HM DIABETES EYE EXAM

## 2014-01-12 ENCOUNTER — Ambulatory Visit: Payer: Medicare Other | Admitting: Physical Therapy

## 2014-01-12 ENCOUNTER — Ambulatory Visit: Payer: Medicare Other | Attending: Adult Health | Admitting: Occupational Therapy

## 2014-01-12 DIAGNOSIS — Z4789 Encounter for other orthopedic aftercare: Secondary | ICD-10-CM | POA: Insufficient documentation

## 2014-01-12 DIAGNOSIS — M6281 Muscle weakness (generalized): Secondary | ICD-10-CM | POA: Insufficient documentation

## 2014-01-12 DIAGNOSIS — R269 Unspecified abnormalities of gait and mobility: Secondary | ICD-10-CM | POA: Diagnosis not present

## 2014-01-12 DIAGNOSIS — R5381 Other malaise: Secondary | ICD-10-CM | POA: Diagnosis not present

## 2014-01-12 DIAGNOSIS — S88119A Complete traumatic amputation at level between knee and ankle, unspecified lower leg, initial encounter: Secondary | ICD-10-CM | POA: Insufficient documentation

## 2014-01-13 ENCOUNTER — Encounter: Payer: Self-pay | Admitting: Cardiology

## 2014-01-13 ENCOUNTER — Ambulatory Visit (INDEPENDENT_AMBULATORY_CARE_PROVIDER_SITE_OTHER): Payer: Medicare Other | Admitting: Cardiology

## 2014-01-13 VITALS — BP 130/64 | HR 71 | Ht 66.0 in | Wt 216.0 lb

## 2014-01-13 DIAGNOSIS — I429 Cardiomyopathy, unspecified: Secondary | ICD-10-CM

## 2014-01-13 DIAGNOSIS — I428 Other cardiomyopathies: Secondary | ICD-10-CM

## 2014-01-13 NOTE — Addendum Note (Signed)
Addended by: Burnett Kanaris on: 01/13/2014 03:33 PM   Modules accepted: Orders

## 2014-01-13 NOTE — Progress Notes (Signed)
Patient ID: [name], female   DOB: 1929/10/19, 80 y.o.   MRN: [id number]    Date:  01/13/2014   ID:  [name], DOB 07-04-1929, MRN [id number]  PCP:  Christina Shin, MD  Cardiologist:  Dr. Jenell Milliner , last seen by [name] in July 2014    History of Present Illness: [name] is a 80 y.o. female who returns for f/u.    She has a h/o diastolic CHF, nonobstructive CAD, DM2, HTN, HL, sleep apnea and CKD. Admitted to [facility] 08/8248 with a/c diastolic CHF and with elevated enzymes that were 2/2 CHF + CKD. Admitted with CP in 05/2012.  She r/o for MI.  Echo 05/31/12: Severe LVH, EF 40-45%, diffuse HK, grade 1 diastolic dysfunction, mild to moderate calcified aortic valve annulus, mild to moderate aortic stenosis, MAC, mild MR, moderate LAE.  Myoview was arranged for risk stratification. Myoview 06/02/12: Low anterior wall scar, no ischemia, EF 37%. It is felt that she would be high risk for contrast-induced nephropathy given her CKD.  Last seen in this office 06/16/2012.  Since then, she presented to the hospital in 08/2012 with an ischemic left leg.  She ultimately underwent L BKA with Dr. Scot [name].  Since she saw [name] in July 2014 she had her left leg amputated above her knee for peripheral arterial disease. Patient is coming today for followup she reports of worsening dyspnea. The patient has been seen by her pulmonologist on just 2 days ago and her CPAP settings have been adjusted but haven't been changed on her machine yet. The patient also states that for the last couple of weeks edema on her right lower extremity has been worsening. She is also experiencing chest pain at that time when she feels dyspnea on exertion.  Sleeps on 2 pillows without change.  No PND.  She is taking all her medications and states that her blood pressure is usually controlled and her primary care doctor's office.   On 08/05/2013 - improved SOB, feels better after CPAP parameters were  adjusted. LE edema has improved. She feels well. No palpitations.  The only complains is wheezing after she wakes up, she uses Advair inhaler twice daily.   11/15/2013 - the patient reports that her lower extremity edema has improved however her shortness of breath has persisted and she is experiencing paroxysmal nocturnal dyspnea about twice a week. She has had an episode of sinusitis with productive cough that has resolved about a week ago. She still experiencing nasal congestion.  01/13/2014 - the patient states that her lower extremity edema and shortness of breath got significantly better she still experiences chest pain and chest tightness on exertion. She is currently in rehabilitation as she fell and is practicing walking again. She is status post left above-knee amputation.  Wt Readings from Last 3 Encounters:  01/13/14 216 lb (97.977 kg)  01/03/14 214 lb (97.07 kg)  12/09/13 203 lb 4.8 oz (92.216 kg)    Past Medical History  Diagnosis Date  . Uterine cancer   . Diabetes mellitus     type II; peripheral neuropathy  . Hypertension   . GERD (gastroesophageal reflux disease)   . Peripheral vascular disease     s/p L BKA 08/2012  . Obesity   . Dyslipidemia   . Diastolic CHF, chronic     EF 50-55%, mild LVH and grade 1 diast. Dysfxn  . Osteoarthritis cervical spine   . DM retinopathy   . Sleep apnea  with CPAP  . History of shingles   . COPD (chronic obstructive pulmonary disease)   . Depression   . Peptic ulcer disease     duodenal  . Peptic stricture of esophagus   . Hiatal hernia   . Diverticulosis   . Arthritis   . ASCVD (arteriosclerotic cardiovascular disease)     on MRI brin  . CAD (coronary artery disease)     a. s/p multiple caths with nonobs CAD;   b. cath 1/10: pLAD 20%, mLAD 40%, pCFX 20%, mCFX 40%, pRCA 60-70%;   c.  Myoview 06/02/12: Low anterior wall scar, no ischemia, EF 37%  . Cardiomyopathy     a. Echo 05/31/12: Severe LVH, EF 40-45%, diffuse HK,  grade 1 diastolic dysfunction, mild to moderate calcified aortic valve annulus, mild to moderate aortic stenosis, MAC, mild MR, moderate LAE  . Asthma     Mild  . Pruritic condition     Idiopathic  . Hyperlipidemia   . Zoster   . Noncompliance   . CKD (chronic kidney disease)     Dr. Lorrene Reid    Current Outpatient Prescriptions  Medication Sig Dispense Refill  . acetaminophen (TYLENOL) 500 MG tablet Take 500 mg by mouth every 6 (six) hours as needed for pain. For pain      . albuterol (PROVENTIL HFA;VENTOLIN HFA) 108 (90 BASE) MCG/ACT inhaler Inhale 2 puffs into the lungs every 4 (four) hours as needed for wheezing or shortness of breath.  1 Inhaler  3  . albuterol (PROVENTIL) (2.5 MG/3ML) 0.083% nebulizer solution Take 3 mLs (2.5 mg total) by nebulization every 4 (four) hours as needed for wheezing or shortness of breath.  75 mL  12  . allopurinol (ZYLOPRIM) 100 MG tablet Take 1 tablet (100 mg total) by mouth daily.  30 tablet  11  . aspirin 325 MG tablet Take 325 mg by mouth daily.      . bisacodyl (DULCOLAX) 5 MG EC tablet Take 1 tablet (5 mg total) by mouth daily as needed for moderate constipation.  30 tablet  0  . calcitRIOL (ROCALTROL) 0.25 MCG capsule Take 0.25 mcg by mouth daily.       Marland Kitchen docusate sodium (COLACE) 100 MG capsule Take 100 mg by mouth 2 (two) times daily as needed for constipation.       Marland Kitchen ezetimibe (ZETIA) 10 MG tablet Take 1 tablet (10 mg total) by mouth every morning.  30 tablet  11  . feeding supplement (GLUCERNA SHAKE) LIQD Take 237 mLs by mouth 2 (two) times daily.      . Fluticasone-Salmeterol (ADVAIR DISKUS) 100-50 MCG/DOSE AEPB Inhale 1 puff into the lungs 2 (two) times daily.  1 each  11  . gabapentin (NEURONTIN) 300 MG capsule Take 1 capsule (300 mg total) by mouth at bedtime.  30 capsule  11  . glucose blood (TRUETEST TEST) test strip Use as instructed  100 each  12  . hydrALAZINE (APRESOLINE) 25 MG tablet Take 1 tablet (25 mg total) by mouth 3 (three) times  daily.  90 tablet  11  . HYDROcodone-acetaminophen (NORCO) 10-325 MG per tablet Take one tablet by mouth every 4 hours as needed for pain  180 tablet  0  . insulin aspart (NOVOLOG) 100 UNIT/ML injection Inject 5 Units into the skin 3 (three) times daily with meals. For cbg >=150      . insulin NPH-regular Human (NOVOLIN 70/30) (70-30) 100 UNIT/ML injection Inject 10 Units into the skin 2 (two)  times daily with a meal. Sliding Scale      . isosorbide mononitrate (IMDUR) 30 MG 24 hr tablet Take 1 tablet (30 mg total) by mouth daily.  30 tablet  11  . labetalol (NORMODYNE) 100 MG tablet Take 1 tablet (100 mg total) by mouth 2 (two) times daily.  60 tablet  11  . latanoprost (XALATAN) 0.005 % ophthalmic solution Place 1 drop into both eyes at bedtime.      . nitroGLYCERIN (NITROSTAT) 0.4 MG SL tablet Place 0.4 mg under the tongue every 5 (five) minutes as needed for chest pain.       . pantoprazole (PROTONIX) 40 MG tablet Take 1 tablet (40 mg total) by mouth 2 (two) times daily.  60 tablet  11  . polyethylene glycol (MIRALAX / GLYCOLAX) packet Take 17 g by mouth daily as needed for mild constipation.  14 each  0  . potassium chloride SA (K-DUR,KLOR-CON) 20 MEQ tablet Take 1 tablet (20 mEq total) by mouth 2 (two) times daily.  10 tablet  0  . sertraline (ZOLOFT) 100 MG tablet Take 1 tablet (100 mg total) by mouth daily.  30 tablet  0   No current facility-administered medications for this visit.    Allergies:    Allergies  Allergen Reactions  . Iohexol      Code: RASH, Desc: [place] ON PT'S CHART ALLERGIC TO IV DYE 09/04/07/RM, Onset Date: 73220254     Social History:  The patient  reports that she has never smoked. She has never used smokeless tobacco. She reports that she does not drink alcohol or use illicit drugs.   ROS:  Please see the history of present illness.      All other systems reviewed and negative.   PHYSICAL EXAM: VS:  BP 130/64  Pulse 71  Ht 5\' 6"  (1.676 m)  Wt 216  lb (97.977 kg)  BMI 34.88 kg/m2 Well nourished, well developed, in no acute distress HEENT: normal Neck: no JVD at 90 Cardiac:  normal S1, S2; RRR; no murmur Lungs:  clear to auscultation bilaterally, no wheezing, rhonchi or rales Abd: soft, nontender Ext: mild RLE edema at the ankles Skin: warm and dry Neuro:  CNs 2-12 intact, no focal abnormalities noted  EKG:  NSR, HR 60, inferolateral T wave changes, no significant change since prior tracing, QTc 490      ASSESSMENT AND PLAN:  1. Chronic Combined Systolic and Diastolic CHF:  Patient is currently on torsemide 20 twice a day, she was using metolazone 5 mg for 41 week 4 x 2.5 mg daily for the last months. Her creatinine was stable on 12/30/2013 there is no reason to check today will followup in 3 months continue the same regimen for now and check BMP prior to the next visit.  2. CAD:   The patient has exertional typical chest pain. Scar of prior nuclear stress testing with left ventricular ejection fraction 37%, cardiac cath was never performed because of her chronic kidney disease. We'll schedule a Lexiscan nuclear stress test to rule out ischemia. Continue ASA and statin.    3. Cardiomyopathy:  No ACE or ARB due to CKD.  Continue beta blocker, hydralazine, nitrates.    4. CKD:  Followed by nephrology.  5. Hypertension:  uncontrolled.  But controlled at her PCP office. We are adding metolazone and see blood pressure the next visit.   6. Hyperlipidemia:  Continue statin.  7. PAD, s/p L BKA:  F/u with [facility] as  planned.   8. COPD - on Advair - added Albuterol PRN  9. nasal congestion - we will start patient on Claritin 10 mg daily.  Disposition:  F/u in 3 month with BMP.      Dorothy Spark  01/13/2014 12:13 PM

## 2014-01-13 NOTE — Patient Instructions (Addendum)
Your physician has requested that you have a lexiscan myoview. For further information please visit [email or web address]. Please follow instruction sheet, as given. Pt will have done at [facility] recommends that you return for lab work 1 week before office visit  Your physician recommends that you schedule a follow-up appointment in: 3 months with Dr. Meda Coffee

## 2014-01-14 ENCOUNTER — Other Ambulatory Visit: Payer: Self-pay | Admitting: *Deleted

## 2014-01-14 DIAGNOSIS — I428 Other cardiomyopathies: Secondary | ICD-10-CM

## 2014-01-19 ENCOUNTER — Ambulatory Visit: Payer: Medicare Other | Attending: Vascular Surgery | Admitting: Physical Therapy

## 2014-01-19 ENCOUNTER — Ambulatory Visit: Payer: Medicare Other | Admitting: Occupational Therapy

## 2014-01-19 DIAGNOSIS — R269 Unspecified abnormalities of gait and mobility: Secondary | ICD-10-CM | POA: Diagnosis not present

## 2014-01-19 DIAGNOSIS — M6281 Muscle weakness (generalized): Secondary | ICD-10-CM | POA: Diagnosis not present

## 2014-01-19 DIAGNOSIS — S88119A Complete traumatic amputation at level between knee and ankle, unspecified lower leg, initial encounter: Secondary | ICD-10-CM | POA: Diagnosis not present

## 2014-01-19 DIAGNOSIS — Z4789 Encounter for other orthopedic aftercare: Secondary | ICD-10-CM | POA: Insufficient documentation

## 2014-01-19 DIAGNOSIS — R5381 Other malaise: Secondary | ICD-10-CM | POA: Diagnosis not present

## 2014-01-20 ENCOUNTER — Ambulatory Visit: Payer: Medicare Other | Admitting: Physical Therapy

## 2014-01-20 ENCOUNTER — Ambulatory Visit: Payer: Medicare Other | Admitting: Occupational Therapy

## 2014-01-20 DIAGNOSIS — Z4789 Encounter for other orthopedic aftercare: Secondary | ICD-10-CM | POA: Diagnosis not present

## 2014-01-21 ENCOUNTER — Ambulatory Visit (INDEPENDENT_AMBULATORY_CARE_PROVIDER_SITE_OTHER): Payer: Medicare Other | Admitting: Internal Medicine

## 2014-01-21 ENCOUNTER — Encounter: Payer: Self-pay | Admitting: Internal Medicine

## 2014-01-21 VITALS — BP 112/68 | HR 71 | Temp 97.7°F | Resp 12 | Wt 219.0 lb

## 2014-01-21 DIAGNOSIS — E1129 Type 2 diabetes mellitus with other diabetic kidney complication: Secondary | ICD-10-CM

## 2014-01-21 DIAGNOSIS — N259 Disorder resulting from impaired renal tubular function, unspecified: Secondary | ICD-10-CM

## 2014-01-21 DIAGNOSIS — N3000 Acute cystitis without hematuria: Secondary | ICD-10-CM

## 2014-01-21 NOTE — Patient Instructions (Signed)
Continue the same diabetes medication. Please return in 1 month for another check with Dr Loanne Drilling.

## 2014-01-21 NOTE — Progress Notes (Signed)
   Subjective:    Patient ID: [name], female    DOB: [birth year], 80 y.o.   MRN: [id number]  HPI Pt has been seen by Dr Loanne Drilling 1 mo ago. Since then she is out of [facility] (she had a mm avulsion). She returns for management of the following problems: DM2: She is on: - NPH-regular 70/30 insulin 10 units 2x a day. - takes the sliding scale NovoLog - Inject 5 Units into the skin 3 (three) times daily with meals. For cbg >=150 She checks her sugars 2-4x a day: - am: 67-180 (usually low 100s) - lunch: 90s - dinner: "I don't know" Renal failure:  - edema is resolved - last Cr was 1.4 (improved) Lab Results  Component Value Date   CREATININE 1.4* 12/30/2013   CREATININE 1.5* 12/16/2013   CREATININE 1.94* 12/08/2013   CREATININE 1.50* 12/05/2013  UTI:  - still occasional dysuria - tx'ed with Abx  x5 days - this helped - last U/A >> UTI resolved HTN: - BP normal - they are unsure if she is taking the Demadex or not  Review of Systems  Constitutional: + weight gain, no fatigue, no subjective hyperthermia/hypothermia, + excessive urination Eyes: no blurry vision, no xerophthalmia ENT: no sore throat, no nodules palpated in throat, no dysphagia/odynophagia, no hoarseness Cardiovascular: no CP/SOB/palpitations/leg swelling Respiratory: no cough/+ SOB, wheezing Gastrointestinal: no N/V/D/C Musculoskeletal: no muscle/joint aches Skin: no rashes, + easy bruising, + hair loss Neurological: no tremors/numbness/tingling/dizziness + Low libido    Objective:   Physical Exam BP 112/68  Pulse 71  Temp(Src) 97.7 F (36.5 C) (Oral)  Resp 12  Wt 219 lb (99.338 kg)  SpO2 94% Wt Readings from Last 3 Encounters:  01/21/14 219 lb (99.338 kg)  01/13/14 216 lb (97.977 kg)  01/03/14 214 lb (97.07 kg)   Constitutional: overweight, in NAD, in wheelchair Eyes: PERRLA, EOMI, no exophthalmos ENT: moist mucous membranes, no thyromegaly, no cervical lymphadenopathy Cardiovascular:  RRR, No MRG Respiratory: CTA B Gastrointestinal: abdomen soft, NT, ND, BS+ Musculoskeletal: L leg prosthesis, no edema in R leg Skin: moist, warm, no rashes    Assessment & Plan:  1. Diabetes - continue NPH/regular 10 units 2x a day + NovoLog SSI 2. Renal insufficiency - improved.  3. UTI - resolved 4. HTN - advised to look if she is on Demadex  - they do not know. >> will look and let me know. Per Dr Cordelia Pen last note, she should stay off it. BP great today.

## 2014-01-25 ENCOUNTER — Ambulatory Visit: Payer: Medicare Other | Admitting: Occupational Therapy

## 2014-01-25 ENCOUNTER — Ambulatory Visit: Payer: Medicare Other | Admitting: Physical Therapy

## 2014-01-25 DIAGNOSIS — Z4789 Encounter for other orthopedic aftercare: Secondary | ICD-10-CM | POA: Diagnosis not present

## 2014-01-26 ENCOUNTER — Ambulatory Visit: Payer: Medicare Other | Admitting: Physical Therapy

## 2014-01-26 ENCOUNTER — Ambulatory Visit: Payer: Medicare Other | Admitting: Occupational Therapy

## 2014-01-26 DIAGNOSIS — Z4789 Encounter for other orthopedic aftercare: Secondary | ICD-10-CM | POA: Diagnosis not present

## 2014-01-27 ENCOUNTER — Encounter: Payer: Medicare Other | Admitting: Occupational Therapy

## 2014-01-27 ENCOUNTER — Ambulatory Visit: Payer: Medicare Other | Admitting: Physical Therapy

## 2014-01-28 ENCOUNTER — Encounter (HOSPITAL_COMMUNITY)
Admission: RE | Admit: 2014-01-28 | Discharge: 2014-01-28 | Disposition: A | Discharge status: Home / Self Care | Payer: Medicare Other | Source: Ambulatory Visit | Attending: Cardiology | Admitting: Cardiology

## 2014-01-28 ENCOUNTER — Encounter (HOSPITAL_COMMUNITY): Payer: Medicare Other

## 2014-01-28 DIAGNOSIS — I428 Other cardiomyopathies: Secondary | ICD-10-CM | POA: Diagnosis present

## 2014-01-28 MED ORDER — REGADENOSON 0.4 MG/5ML IV SOLN
INTRAVENOUS | Status: AC
  Filled 2014-01-28: qty 5

## 2014-01-28 MED ORDER — TECHNETIUM TC 99M SESTAMIBI - CARDIOLITE
30.0000 | Freq: Once | INTRAVENOUS | Status: AC | PRN
  Administered 2014-01-28: 10:00:00 30 via INTRAVENOUS

## 2014-01-28 MED ORDER — REGADENOSON 0.4 MG/5ML IV SOLN
0.4000 mg | Freq: Once | INTRAVENOUS | Status: AC
Start: 2014-01-28 — End: 2014-01-28
  Administered 2014-01-28: 0.4 mg via INTRAVENOUS

## 2014-01-28 MED ORDER — TECHNETIUM TC 99M SESTAMIBI GENERIC - CARDIOLITE
10.0000 | Freq: Once | INTRAVENOUS | Status: AC | PRN
  Administered 2014-01-28: 10 via INTRAVENOUS

## 2014-01-28 NOTE — Progress Notes (Signed)
Lexiscan CL performed. Pretest, no chest pain or SOB. With Lexi, pt had SOB, dizziness and flushed feeling.  Symptoms resolved with time. ECG with baseline abnormalities,  Minimal changes Final report and images pending.

## 2014-01-31 ENCOUNTER — Ambulatory Visit: Payer: Medicare Other | Admitting: Occupational Therapy

## 2014-01-31 ENCOUNTER — Ambulatory Visit: Payer: Medicare Other | Admitting: Endocrinology

## 2014-01-31 ENCOUNTER — Ambulatory Visit: Payer: Medicare Other | Admitting: Physical Therapy

## 2014-01-31 DIAGNOSIS — Z4789 Encounter for other orthopedic aftercare: Secondary | ICD-10-CM | POA: Diagnosis not present

## 2014-02-01 ENCOUNTER — Telehealth: Payer: Self-pay | Admitting: Cardiology

## 2014-02-01 NOTE — Telephone Encounter (Signed)
Notified pt of result notes.  See in result notes 8/18

## 2014-02-01 NOTE — Telephone Encounter (Signed)
New message          Pt calling about stress test results

## 2014-02-02 ENCOUNTER — Ambulatory Visit: Payer: Medicare Other | Admitting: Physical Therapy

## 2014-02-02 DIAGNOSIS — Z4789 Encounter for other orthopedic aftercare: Secondary | ICD-10-CM | POA: Diagnosis not present

## 2014-02-07 ENCOUNTER — Ambulatory Visit: Payer: Medicare Other | Admitting: Occupational Therapy

## 2014-02-07 ENCOUNTER — Ambulatory Visit: Payer: Medicare Other | Admitting: Physical Therapy

## 2014-02-07 DIAGNOSIS — Z4789 Encounter for other orthopedic aftercare: Secondary | ICD-10-CM | POA: Diagnosis not present

## 2014-02-09 ENCOUNTER — Ambulatory Visit: Payer: Medicare Other | Admitting: Occupational Therapy

## 2014-02-09 ENCOUNTER — Ambulatory Visit: Payer: Medicare Other | Admitting: Physical Therapy

## 2014-02-09 DIAGNOSIS — Z4789 Encounter for other orthopedic aftercare: Secondary | ICD-10-CM | POA: Diagnosis not present

## 2014-02-13 ENCOUNTER — Other Ambulatory Visit: Payer: Self-pay | Admitting: Adult Health

## 2014-02-14 ENCOUNTER — Ambulatory Visit: Payer: Medicare Other | Admitting: Physical Therapy

## 2014-02-14 ENCOUNTER — Ambulatory Visit: Payer: Medicare Other | Admitting: Occupational Therapy

## 2014-02-14 DIAGNOSIS — Z4789 Encounter for other orthopedic aftercare: Secondary | ICD-10-CM | POA: Diagnosis not present

## 2014-02-15 ENCOUNTER — Telehealth: Payer: Self-pay | Admitting: Endocrinology

## 2014-02-15 NOTE — Telephone Encounter (Signed)
Need refill on

## 2014-02-16 ENCOUNTER — Ambulatory Visit: Payer: Medicare Other | Admitting: Occupational Therapy

## 2014-02-16 ENCOUNTER — Ambulatory Visit: Payer: Medicare Other | Attending: Vascular Surgery | Admitting: Physical Therapy

## 2014-02-16 DIAGNOSIS — Z4789 Encounter for other orthopedic aftercare: Secondary | ICD-10-CM | POA: Diagnosis present

## 2014-02-16 DIAGNOSIS — R5381 Other malaise: Secondary | ICD-10-CM | POA: Insufficient documentation

## 2014-02-16 DIAGNOSIS — S88119A Complete traumatic amputation at level between knee and ankle, unspecified lower leg, initial encounter: Secondary | ICD-10-CM | POA: Diagnosis not present

## 2014-02-16 DIAGNOSIS — M6281 Muscle weakness (generalized): Secondary | ICD-10-CM | POA: Insufficient documentation

## 2014-02-16 DIAGNOSIS — R269 Unspecified abnormalities of gait and mobility: Secondary | ICD-10-CM | POA: Insufficient documentation

## 2014-02-17 ENCOUNTER — Encounter: Payer: Self-pay | Admitting: Endocrinology

## 2014-02-17 ENCOUNTER — Ambulatory Visit (INDEPENDENT_AMBULATORY_CARE_PROVIDER_SITE_OTHER): Payer: Medicare Other | Admitting: Endocrinology

## 2014-02-17 VITALS — BP 118/66 | HR 60 | Temp 97.9°F | Ht 66.0 in | Wt 220.0 lb

## 2014-02-17 DIAGNOSIS — E1351 Other specified diabetes mellitus with diabetic peripheral angiopathy without gangrene: Secondary | ICD-10-CM

## 2014-02-17 DIAGNOSIS — R0602 Shortness of breath: Secondary | ICD-10-CM

## 2014-02-17 DIAGNOSIS — I798 Other disorders of arteries, arterioles and capillaries in diseases classified elsewhere: Secondary | ICD-10-CM

## 2014-02-17 DIAGNOSIS — R7989 Other specified abnormal findings of blood chemistry: Secondary | ICD-10-CM

## 2014-02-17 DIAGNOSIS — E790 Hyperuricemia without signs of inflammatory arthritis and tophaceous disease: Secondary | ICD-10-CM

## 2014-02-17 LAB — BASIC METABOLIC PANEL
BUN: 20 mg/dL (ref 6–23)
CO2: 25 mEq/L (ref 19–32)
Calcium: 9 mg/dL (ref 8.4–10.5)
Chloride: 107 mEq/L (ref 96–112)
Creatinine, Ser: 1.4 mg/dL — ABNORMAL HIGH (ref 0.4–1.2)
GFR: 46.63 mL/min — AB (ref >60.00)
Glucose, Bld: 116 mg/dL — ABNORMAL HIGH (ref 70–99)
Potassium: 4.2 mEq/L (ref 3.5–5.1)
Sodium: 140 mEq/L (ref 135–145)

## 2014-02-17 LAB — HEMOGLOBIN A1C: HEMOGLOBIN A1C: 8.7 % — AB (ref 4.6–6.5)

## 2014-02-17 LAB — URIC ACID: Uric Acid, Serum: 5.8 mg/dL (ref 2.4–7.0)

## 2014-02-17 LAB — BRAIN NATRIURETIC PEPTIDE: Pro B Natriuretic peptide (BNP): 768 pg/mL — ABNORMAL HIGH (ref 0.0–100.0)

## 2014-02-17 MED ORDER — ALLOPURINOL 100 MG PO TABS: 100.0000 mg | ORAL_TABLET | Freq: Every day | ORAL | Status: DC

## 2014-02-17 MED ORDER — EZETIMIBE 10 MG PO TABS: 10.0000 mg | ORAL_TABLET | Freq: Every morning | ORAL | Status: DC

## 2014-02-17 MED ORDER — ISOSORBIDE MONONITRATE ER 30 MG PO TB24: 30.0000 mg | ORAL_TABLET | Freq: Every day | ORAL | Status: DC

## 2014-02-17 MED ORDER — POTASSIUM CHLORIDE CRYS ER 20 MEQ PO TBCR: 20.0000 meq | EXTENDED_RELEASE_TABLET | Freq: Two times a day (BID) | ORAL | Status: DC

## 2014-02-17 MED ORDER — FUROSEMIDE 80 MG PO TABS: 80.0000 mg | ORAL_TABLET | Freq: Two times a day (BID) | ORAL | Status: DC

## 2014-02-17 NOTE — Patient Instructions (Addendum)
blood tests are being requested for you today.  We'll contact you with results.    check your blood sugar twice a day.  vary the time of day when you check, between before the 3 meals, and at bedtime.  also check if you have symptoms of your blood sugar being too high or too low.  please keep a record of the readings and bring it to your next appointment here.  You can write it on any piece of paper.  please call us sooner if your blood sugar goes below 70, or if you have a lot of readings over 200.   Please come back for a follow-up appointment in 3 months.    

## 2014-02-17 NOTE — Progress Notes (Signed)
Subjective:    Patient ID: [name], female    DOB: Oct 27, 1929, 80 y.o.   MRN: [id number]  HPI The state of at least three ongoing medical problems is addressed today, with interval history of each noted here:  Renal failure: edema persists.  Hypokalemia: denies cramps. DM: no cbg record, but states cbg's are well-controlled.  It is in general higher as the day goes on.  denies hypoglycemia.   Past Medical History  Diagnosis Date  . Uterine cancer   . Diabetes mellitus     type II; peripheral neuropathy  . Hypertension   . GERD (gastroesophageal reflux disease)   . Peripheral vascular disease     s/p L BKA 08/2012  . Obesity   . Dyslipidemia   . Diastolic CHF, chronic     EF 50-55%, mild LVH and grade 1 diast. Dysfxn  . Osteoarthritis cervical spine   . DM retinopathy   . Sleep apnea     with CPAP  . History of shingles   . COPD (chronic obstructive pulmonary disease)   . Depression   . Peptic ulcer disease     duodenal  . Peptic stricture of esophagus   . Hiatal hernia   . Diverticulosis   . Arthritis   . ASCVD (arteriosclerotic cardiovascular disease)     on MRI brin  . CAD (coronary artery disease)     a. s/p multiple caths with nonobs CAD;   b. cath 1/10: pLAD 20%, mLAD 40%, pCFX 20%, mCFX 40%, pRCA 60-70%;   c.  Myoview 06/02/12: Low anterior wall scar, no ischemia, EF 37%  . Cardiomyopathy     a. Echo 05/31/12: Severe LVH, EF 40-45%, diffuse HK, grade 1 diastolic dysfunction, mild to moderate calcified aortic valve annulus, mild to moderate aortic stenosis, MAC, mild MR, moderate LAE  . Asthma     Mild  . Pruritic condition     Idiopathic  . Hyperlipidemia   . Zoster   . Noncompliance   . CKD (chronic kidney disease)     Dr. Lorrene Reid    Past Surgical History  Procedure Laterality Date  . Tubal ligation  1967  . Knee arthroscopy  10/1998    Left  . Craniotomy  1997    Left for SDH  . Cataract extraction, bilateral  2005  . Hernia repair      . Esophagogastroduodenoscopy  04/04/2004  . Spine surgery      C-spine and lumbar surgery  . Cholecystectomy  2010  . Cardiac catheterization    . Dexa  7/05  . Abdominal hysterectomy    . Amputation Left 09/04/2012    Procedure: AMPUTATION BELOW KNEE;  Surgeon: Angelia Mould, MD;  Location: [facility];  Service: Vascular;  Laterality: Left;    History   Social History  . Marital Status: Married    Spouse Name: N/A    Number of Children: 7  . Years of Education: N/A   Occupational History  . Retired    Social History Main Topics  . Smoking status: Never Smoker   . Smokeless tobacco: Never Used  . Alcohol Use: No     Comment: rare  . Drug Use: No  . Sexual Activity: No   Other Topics Concern  . Not on file   Social History Narrative  . No narrative on file    Current Outpatient Prescriptions on File Prior to Visit  Medication Sig Dispense Refill  . allopurinol (ZYLOPRIM) 100 MG tablet  Take 1 tablet (100 mg total) by mouth daily.  30 tablet  11  . isosorbide mononitrate (IMDUR) 30 MG 24 hr tablet Take 1 tablet (30 mg total) by mouth daily.  30 tablet  11  . potassium chloride SA (K-DUR,KLOR-CON) 20 MEQ tablet Take 1 tablet (20 mEq total) by mouth 2 (two) times daily.  10 tablet  0  . acetaminophen (TYLENOL) 500 MG tablet Take 500 mg by mouth every 6 (six) hours as needed for pain. For pain      . albuterol (PROVENTIL HFA;VENTOLIN HFA) 108 (90 BASE) MCG/ACT inhaler Inhale 2 puffs into the lungs every 4 (four) hours as needed for wheezing or shortness of breath.  1 Inhaler  3  . albuterol (PROVENTIL) (2.5 MG/3ML) 0.083% nebulizer solution Take 3 mLs (2.5 mg total) by nebulization every 4 (four) hours as needed for wheezing or shortness of breath.  75 mL  12  . aspirin 325 MG tablet Take 325 mg by mouth daily.      . bisacodyl (DULCOLAX) 5 MG EC tablet Take 1 tablet (5 mg total) by mouth daily as needed for moderate constipation.  30 tablet  0  . calcitRIOL (ROCALTROL)  0.25 MCG capsule Take 0.25 mcg by mouth daily.       Marland Kitchen docusate sodium (COLACE) 100 MG capsule Take 100 mg by mouth 2 (two) times daily as needed for constipation.       Marland Kitchen ezetimibe (ZETIA) 10 MG tablet Take 1 tablet (10 mg total) by mouth every morning.  30 tablet  11  . feeding supplement (GLUCERNA SHAKE) LIQD Take 237 mLs by mouth 2 (two) times daily.      . Fluticasone-Salmeterol (ADVAIR DISKUS) 100-50 MCG/DOSE AEPB Inhale 1 puff into the lungs 2 (two) times daily.  1 each  11  . gabapentin (NEURONTIN) 300 MG capsule Take 1 capsule (300 mg total) by mouth at bedtime.  30 capsule  11  . glucose blood (TRUETEST TEST) test strip Use as instructed  100 each  12  . hydrALAZINE (APRESOLINE) 25 MG tablet Take 1 tablet (25 mg total) by mouth 3 (three) times daily.  90 tablet  11  . HYDROcodone-acetaminophen (NORCO) 10-325 MG per tablet Take one tablet by mouth every 4 hours as needed for pain  180 tablet  0  . insulin aspart (NOVOLOG) 100 UNIT/ML injection Inject 5 Units into the skin 3 (three) times daily with meals. For cbg >=150      . insulin NPH-regular Human (NOVOLIN 70/30) (70-30) 100 UNIT/ML injection Inject 10 Units into the skin 2 (two) times daily with a meal. Sliding Scale      . labetalol (NORMODYNE) 100 MG tablet Take 1 tablet (100 mg total) by mouth 2 (two) times daily.  60 tablet  11  . latanoprost (XALATAN) 0.005 % ophthalmic solution Place 1 drop into both eyes at bedtime.      . metolazone (ZAROXOLYN) 2.5 MG tablet       . nitroGLYCERIN (NITROSTAT) 0.4 MG SL tablet Place 0.4 mg under the tongue every 5 (five) minutes as needed for chest pain.       . pantoprazole (PROTONIX) 40 MG tablet Take 1 tablet (40 mg total) by mouth 2 (two) times daily.  60 tablet  11  . polyethylene glycol (MIRALAX / GLYCOLAX) packet Take 17 g by mouth daily as needed for mild constipation.  14 each  0  . sertraline (ZOLOFT) 100 MG tablet Take 1 tablet (100 mg total)  by mouth daily.  30 tablet  0   No  current facility-administered medications on file prior to visit.    Allergies  Allergen Reactions  . Iohexol      Code: RASH, Desc: [place] ON PT'S CHART ALLERGIC TO IV DYE 09/04/07/RM, Onset Date: 33825053     Family History  Problem Relation Age of Onset  . Cancer Mother     "Stomach" Cancer  . Diabetes Mother   . Heart disease Mother   . Stomach cancer Mother   . Hypertension Mother   . Lymphoma Father   . Hypertension Father   . Kidney disease Paternal Grandmother   . Asthma Other   . Diabetes Sister     BP 118/66  Pulse 60  Temp(Src) 97.9 F (36.6 C) (Oral)  Ht 5\' 6"  (1.676 m)  Wt 220 lb (99.791 kg)  BMI 35.53 kg/m2  SpO2 97%   Review of Systems she has slight doe.  She has gained weight.     Objective:   Physical Exam VITAL SIGNS:  See vs page GENERAL: no distress.  In wheelchair.  Pulses: right dorsalis pedis is intact.  LLE: BKA  Right foot: no deformity. normal color and temp. 1+ leg edema. There is onychomycosis.  Skin: no ulcer on the right foot.  Neuro: sensation is intact to touch on the right foot.   Lab Results  Component Value Date   CREATININE 1.4* 02/17/2014   BUN 20 02/17/2014   NA 140 02/17/2014   K 4.2 02/17/2014   CL 107 02/17/2014   CO2 25 02/17/2014    Lab Results  Component Value Date   HGBA1C 8.7* 02/17/2014  BNP=768    Assessment & Plan:  Renal insufficiency: stable.   Hypokalemia: well-replaced.  Pt is advised to continue the same medication DM: moderate exacerbation CHF: mild exacerbation Weight gain, possibly due to hypervolemia.     Patient is advised the following: Patient Instructions  blood tests are being requested for you today.  We'll contact you with results.    check your blood sugar twice a day.  vary the time of day when you check, between before the 3 meals, and at bedtime.  also check if you have symptoms of your blood sugar being too high or too low.  please keep a record of the readings and bring it to  your next appointment here.  You can write it on any piece of paper.  please call us sooner if your blood sugar goes below 70, or if you have a lot of readings over 200.   Please come back for a follow-up appointment in 3 months.   Please increase insulin to 15 units with breakfast, and 10 units with the evening meal. Please also restart furosemide.

## 2014-02-22 ENCOUNTER — Ambulatory Visit: Payer: Medicare Other | Admitting: Physical Therapy

## 2014-02-22 ENCOUNTER — Ambulatory Visit: Payer: Medicare Other | Admitting: Occupational Therapy

## 2014-02-22 DIAGNOSIS — Z4789 Encounter for other orthopedic aftercare: Secondary | ICD-10-CM | POA: Diagnosis not present

## 2014-02-23 ENCOUNTER — Other Ambulatory Visit: Payer: Self-pay | Admitting: Adult Health

## 2014-02-24 ENCOUNTER — Other Ambulatory Visit: Payer: Self-pay | Admitting: Endocrinology

## 2014-02-24 ENCOUNTER — Ambulatory Visit: Payer: Medicare Other | Admitting: Occupational Therapy

## 2014-02-24 ENCOUNTER — Ambulatory Visit: Payer: Medicare Other | Admitting: Physical Therapy

## 2014-02-24 DIAGNOSIS — Z4789 Encounter for other orthopedic aftercare: Secondary | ICD-10-CM | POA: Diagnosis not present

## 2014-02-25 ENCOUNTER — Encounter: Payer: Self-pay | Admitting: Gastroenterology

## 2014-02-25 ENCOUNTER — Other Ambulatory Visit: Payer: Self-pay

## 2014-02-25 MED ORDER — INSULIN NPH ISOPHANE & REGULAR (70-30) 100 UNIT/ML ~~LOC~~ SUSP: SUBCUTANEOUS | Status: DC

## 2014-03-01 ENCOUNTER — Ambulatory Visit: Payer: Medicare Other | Admitting: Physical Therapy

## 2014-03-01 ENCOUNTER — Ambulatory Visit: Payer: Medicare Other | Admitting: Occupational Therapy

## 2014-03-01 DIAGNOSIS — Z4789 Encounter for other orthopedic aftercare: Secondary | ICD-10-CM | POA: Diagnosis not present

## 2014-03-03 ENCOUNTER — Ambulatory Visit: Payer: Medicare Other | Admitting: Physical Therapy

## 2014-03-03 ENCOUNTER — Ambulatory Visit: Payer: Medicare Other | Admitting: Occupational Therapy

## 2014-03-03 DIAGNOSIS — Z4789 Encounter for other orthopedic aftercare: Secondary | ICD-10-CM | POA: Diagnosis not present

## 2014-03-06 ENCOUNTER — Other Ambulatory Visit: Payer: Self-pay | Admitting: Adult Health

## 2014-03-08 ENCOUNTER — Ambulatory Visit: Payer: Medicare Other | Admitting: Physical Therapy

## 2014-03-08 ENCOUNTER — Ambulatory Visit: Payer: Medicare Other | Admitting: Occupational Therapy

## 2014-03-08 DIAGNOSIS — Z4789 Encounter for other orthopedic aftercare: Secondary | ICD-10-CM | POA: Diagnosis not present

## 2014-03-10 ENCOUNTER — Ambulatory Visit: Payer: Medicare Other | Admitting: Occupational Therapy

## 2014-03-10 ENCOUNTER — Ambulatory Visit: Payer: Medicare Other | Admitting: Physical Therapy

## 2014-03-10 DIAGNOSIS — Z4789 Encounter for other orthopedic aftercare: Secondary | ICD-10-CM | POA: Diagnosis not present

## 2014-03-11 ENCOUNTER — Ambulatory Visit: Payer: Medicare Other | Admitting: Internal Medicine

## 2014-03-26 ENCOUNTER — Other Ambulatory Visit: Payer: Self-pay | Admitting: Adult Health

## 2014-03-26 ENCOUNTER — Other Ambulatory Visit: Payer: Self-pay | Admitting: Endocrinology

## 2014-04-08 ENCOUNTER — Other Ambulatory Visit (INDEPENDENT_AMBULATORY_CARE_PROVIDER_SITE_OTHER): Payer: Medicare Other | Admitting: *Deleted

## 2014-04-08 ENCOUNTER — Telehealth: Payer: Self-pay | Admitting: *Deleted

## 2014-04-08 DIAGNOSIS — I429 Cardiomyopathy, unspecified: Secondary | ICD-10-CM

## 2014-04-08 DIAGNOSIS — E876 Hypokalemia: Secondary | ICD-10-CM

## 2014-04-08 DIAGNOSIS — I10 Essential (primary) hypertension: Secondary | ICD-10-CM

## 2014-04-08 DIAGNOSIS — E785 Hyperlipidemia, unspecified: Secondary | ICD-10-CM

## 2014-04-08 DIAGNOSIS — I25709 Atherosclerosis of coronary artery bypass graft(s), unspecified, with unspecified angina pectoris: Secondary | ICD-10-CM

## 2014-04-08 DIAGNOSIS — I5042 Chronic combined systolic (congestive) and diastolic (congestive) heart failure: Secondary | ICD-10-CM

## 2014-04-08 LAB — BASIC METABOLIC PANEL
BUN: 54 mg/dL — ABNORMAL HIGH (ref 6–23)
CO2: 30 mEq/L (ref 19–32)
Calcium: 9.4 mg/dL (ref 8.4–10.5)
Chloride: 96 mEq/L (ref 96–112)
Creatinine, Ser: 2.4 mg/dL — ABNORMAL HIGH (ref 0.4–1.2)
GFR: 24.82 mL/min — ABNORMAL LOW (ref >60.00)
Glucose, Bld: 159 mg/dL — ABNORMAL HIGH (ref 70–99)
Potassium: 3 mEq/L — ABNORMAL LOW (ref 3.5–5.1)
Sodium: 138 mEq/L (ref 135–145)

## 2014-04-08 MED ORDER — FUROSEMIDE 80 MG PO TABS: 80.0000 mg | ORAL_TABLET | Freq: Every day | ORAL | Status: DC

## 2014-04-08 NOTE — Telephone Encounter (Signed)
After speaking with the pt, the pt states her meds have changed since prior ov with Dr Meda Coffee, and according to the pt she is no longer taking torsemide, she is taking Lasix 80 mg BID.  Discussed this with the pt and instructed her that per Dr Meda Coffee she needs to stop taking her metolazone now and start taking Lasix 80 mg once daily, and continue taking Potassium 20 mEq BID, and we will check labs at her OV with Dr Meda Coffee on next Wednesday 10/28 to check a CMET.   Pt verbalized understanding and agrees with this plan.

## 2014-04-08 NOTE — Telephone Encounter (Signed)
Message copied by Nuala Alpha on Fri Apr 08, 2014  5:26 PM ------      Message from: Dorothy Spark      Created: Fri Apr 08, 2014  5:08 PM       Her crea is elevated, she needs to stop using metolazone and decrease torsemide to once daily - 20 mg. She needs to take potassium 20 mEq BID (instead of 10 mg PO BID she is taking  Now), repeat labs on [place] - follow up visit on Wednesday. IF too much for her to come twice, then we will do labs on Wednesday.       ------

## 2014-04-12 ENCOUNTER — Encounter: Payer: Self-pay | Admitting: *Deleted

## 2014-04-13 ENCOUNTER — Ambulatory Visit (INDEPENDENT_AMBULATORY_CARE_PROVIDER_SITE_OTHER): Payer: Medicare Other | Admitting: Cardiology

## 2014-04-13 ENCOUNTER — Encounter: Payer: Self-pay | Admitting: Cardiology

## 2014-04-13 ENCOUNTER — Other Ambulatory Visit (INDEPENDENT_AMBULATORY_CARE_PROVIDER_SITE_OTHER): Payer: Medicare Other | Admitting: *Deleted

## 2014-04-13 ENCOUNTER — Telehealth: Payer: Self-pay | Admitting: *Deleted

## 2014-04-13 VITALS — BP 140/50 | HR 66 | Ht 66.0 in | Wt 204.0 lb

## 2014-04-13 DIAGNOSIS — I10 Essential (primary) hypertension: Secondary | ICD-10-CM

## 2014-04-13 DIAGNOSIS — I5042 Chronic combined systolic (congestive) and diastolic (congestive) heart failure: Secondary | ICD-10-CM

## 2014-04-13 DIAGNOSIS — I25709 Atherosclerosis of coronary artery bypass graft(s), unspecified, with unspecified angina pectoris: Secondary | ICD-10-CM

## 2014-04-13 DIAGNOSIS — R899 Unspecified abnormal finding in specimens from other organs, systems and tissues: Secondary | ICD-10-CM

## 2014-04-13 DIAGNOSIS — E876 Hypokalemia: Secondary | ICD-10-CM

## 2014-04-13 DIAGNOSIS — E785 Hyperlipidemia, unspecified: Secondary | ICD-10-CM

## 2014-04-13 LAB — COMPREHENSIVE METABOLIC PANEL
ALT: 10 U/L (ref 0–35)
AST: 24 U/L (ref 0–37)
Albumin: 3.5 g/dL (ref 3.5–5.2)
Alkaline Phosphatase: 79 U/L (ref 39–117)
BUN: 60 mg/dL — ABNORMAL HIGH (ref 6–23)
CO2: 25 mEq/L (ref 19–32)
Calcium: 9.1 mg/dL (ref 8.4–10.5)
Chloride: 96 mEq/L (ref 96–112)
Creatinine, Ser: 2.4 mg/dL — ABNORMAL HIGH (ref 0.4–1.2)
GFR: 24.82 mL/min — ABNORMAL LOW (ref >60.00)
Glucose, Bld: 189 mg/dL — ABNORMAL HIGH (ref 70–99)
Potassium: 2.7 mEq/L — CL (ref 3.5–5.1)
Sodium: 140 mEq/L (ref 135–145)
Total Bilirubin: 0.9 mg/dL (ref 0.2–1.2)
Total Protein: 7.4 g/dL (ref 6.0–8.3)

## 2014-04-13 NOTE — Patient Instructions (Signed)
Your physician recommends that you continue on your current medications as directed. Please refer to the Current Medication list given to you today.   Your physician recommends that you return for lab work in: TODAY (BMET)     Your physician recommends that you schedule a follow-up appointment in: 2 MONTHS WITH DR NELSON   

## 2014-04-13 NOTE — Telephone Encounter (Signed)
Message copied by Nuala Alpha on Wed Apr 13, 2014  5:34 PM ------      Message from: Dorothy Spark      Created: Wed Apr 13, 2014  4:55 PM       She needs to stop taking all the diuretics - currently only lasix and continue to hold metolazone.       She needs to increase KCl to 40 mEq daily and have BMP rechecked in 1 week. ------

## 2014-04-13 NOTE — Telephone Encounter (Signed)
Contacted the pt about her lab results showing critical K level at 2.7 and per Dr Meda Coffee she needs to stop taking all the diuretics - currently only lasix and continue to hold metolazone. She needs to increase KCl to 40 mEq daily (take 2 tabs instead of 1) and have BMP rechecked in 1 week. Made pt lab appt for 04/20/14 and confirmed this with her. Pt verbalized understanding of instructions given, with verbal feedback, and agrees with this plan.

## 2014-04-13 NOTE — Progress Notes (Signed)
Patient ID: [name], female   DOB: 22-Nov-1929, 80 y.o.   MRN: [id number]    Date:  04/13/2014   ID:  [name], DOB March 09, 1930, MRN [id number]  PCP:  Renato Shin, MD  Cardiologist:  Dr. Jenell Milliner , last seen by Richardson Dopp in July 2014    History of Present Illness: [name] is a 80 y.o. female who returns for f/u.    She has a h/o diastolic CHF, nonobstructive CAD, DM2, HTN, HL, sleep apnea and CKD. Admitted to [facility] 07/9922 with a/c diastolic CHF and with elevated enzymes that were 2/2 CHF + CKD. Admitted with CP in 05/2012.  She r/o for MI.  Echo 05/31/12: Severe LVH, EF 40-45%, diffuse HK, grade 1 diastolic dysfunction, mild to moderate calcified aortic valve annulus, mild to moderate aortic stenosis, MAC, mild MR, moderate LAE.  Myoview was arranged for risk stratification. Myoview 06/02/12: Low anterior wall scar, no ischemia, EF 37%. It is felt that she would be high risk for contrast-induced nephropathy given her CKD.  Last seen in this office 06/16/2012.  Since then, she presented to the hospital in 08/2012 with an ischemic left leg.  She ultimately underwent L BKA with Dr. Scot Dock.  Since she saw Richardson Dopp in July 2014 she had her left leg amputated above her knee for peripheral arterial disease. Patient is coming today for followup she reports of worsening dyspnea. The patient has been seen by her pulmonologist on just 2 days ago and her CPAP settings have been adjusted but haven't been changed on her machine yet. The patient also states that for the last couple of weeks edema on her right lower extremity has been worsening. She is also experiencing chest pain at that time when she feels dyspnea on exertion.  Sleeps on 2 pillows without change.  No PND.  She is taking all her medications and states that her blood pressure is usually controlled and her primary care doctor's office.   On 08/05/2013 - improved SOB, feels better after CPAP parameters were  adjusted. LE edema has improved. She feels well. No palpitations.  The only complains is wheezing after she wakes up, she uses Advair inhaler twice daily.   11/15/2013 - the patient reports that her lower extremity edema has improved however her shortness of breath has persisted and she is experiencing paroxysmal nocturnal dyspnea about twice a week. She has had an episode of sinusitis with productive cough that has resolved about a week ago. She still experiencing nasal congestion.  01/13/2014 - the patient states that her lower extremity edema and shortness of breath got significantly better she still experiences chest pain and chest tightness on exertion. She is currently in rehabilitation as she fell and is practicing walking again. She is status post left above-knee amputation.  04/13/14 - the patient is coming for 3 months follow-up, she is down 12 pounds as the last visit. She had her labs checked last week with worsening creatinine currently 2.4 previously 1.4. Also her potassium was 3.0 we decreased furosemide 80 mg twice a day to 80 mg daily and held metolazone. We also increased her potassium. She states that she is going to the gym on a regular basis and is able to walk and enjoy her life. She denies any chest pain or shortness of breath. She also denies orthopnea or proximal nocturnal dyspnea.  Wt Readings from Last 3 Encounters:  04/13/14 204 lb (92.534 kg)  02/17/14 220 lb (99.791 kg)  01/21/14 219 lb (99.338 kg)    Past Medical History  Diagnosis Date  . Uterine cancer   . Diabetes mellitus     type II; peripheral neuropathy  . Hypertension   . GERD (gastroesophageal reflux disease)   . Peripheral vascular disease     s/p L BKA 08/2012  . Obesity   . Dyslipidemia   . Diastolic CHF, chronic     EF 50-55%, mild LVH and grade 1 diast. Dysfxn  . Osteoarthritis cervical spine   . DM retinopathy   . Sleep apnea     with CPAP  . History of shingles   . COPD (chronic  obstructive pulmonary disease)   . Depression   . Peptic ulcer disease     duodenal  . Peptic stricture of esophagus   . Hiatal hernia   . Diverticulosis   . Arthritis   . ASCVD (arteriosclerotic cardiovascular disease)     on MRI brin  . CAD (coronary artery disease)     a. s/p multiple caths with nonobs CAD;   b. cath 1/10: pLAD 20%, mLAD 40%, pCFX 20%, mCFX 40%, pRCA 60-70%;   c.  Myoview 06/02/12: Low anterior wall scar, no ischemia, EF 37%  . Cardiomyopathy     a. Echo 05/31/12: Severe LVH, EF 40-45%, diffuse HK, grade 1 diastolic dysfunction, mild to moderate calcified aortic valve annulus, mild to moderate aortic stenosis, MAC, mild MR, moderate LAE  . Asthma     Mild  . Pruritic condition     Idiopathic  . Hyperlipidemia   . Zoster   . Noncompliance   . CKD (chronic kidney disease)     Dr. Lorrene Reid    Current Outpatient Prescriptions  Medication Sig Dispense Refill  . acetaminophen (TYLENOL) 500 MG tablet Take 500 mg by mouth every 6 (six) hours as needed for pain. For pain      . albuterol (PROVENTIL HFA;VENTOLIN HFA) 108 (90 BASE) MCG/ACT inhaler Inhale 2 puffs into the lungs every 4 (four) hours as needed for wheezing or shortness of breath.  1 Inhaler  3  . albuterol (PROVENTIL) (2.5 MG/3ML) 0.083% nebulizer solution Take 3 mLs (2.5 mg total) by nebulization every 4 (four) hours as needed for wheezing or shortness of breath.  75 mL  12  . allopurinol (ZYLOPRIM) 100 MG tablet Take 1 tablet (100 mg total) by mouth daily.  30 tablet  11  . aspirin 325 MG tablet Take 325 mg by mouth daily.      . bisacodyl (DULCOLAX) 5 MG EC tablet Take 1 tablet (5 mg total) by mouth daily as needed for moderate constipation.  30 tablet  0  . calcitRIOL (ROCALTROL) 0.25 MCG capsule Take 0.25 mcg by mouth daily.       Marland Kitchen docusate sodium (COLACE) 100 MG capsule Take 100 mg by mouth 2 (two) times daily as needed for constipation.       Marland Kitchen ezetimibe (ZETIA) 10 MG tablet Take 1 tablet (10 mg total)  by mouth every morning.  30 tablet  11  . feeding supplement (GLUCERNA SHAKE) LIQD Take 237 mLs by mouth 2 (two) times daily.      . Fluticasone-Salmeterol (ADVAIR DISKUS) 100-50 MCG/DOSE AEPB Inhale 1 puff into the lungs 2 (two) times daily.  1 each  11  . furosemide (LASIX) 80 MG tablet Take 1 tablet (80 mg total) by mouth daily.  90 tablet  3  . gabapentin (NEURONTIN) 300 MG capsule Take 1 capsule (300 mg  total) by mouth at bedtime.  30 capsule  11  . glucose blood (ONE TOUCH ULTRA TEST) test strip Use 2 times per day  100 each  2  . HUMALOG 100 UNIT/ML injection       . hydrALAZINE (APRESOLINE) 25 MG tablet Take 1 tablet (25 mg total) by mouth 3 (three) times daily.  90 tablet  11  . HYDROcodone-acetaminophen (NORCO) 10-325 MG per tablet Take one tablet by mouth every 4 hours as needed for pain  180 tablet  0  . insulin NPH-regular Human (NOVOLIN 70/30) (70-30) 100 UNIT/ML injection 15 units with breakfast, and 10 units with the evening meal.  10 mL  2  . isosorbide mononitrate (IMDUR) 30 MG 24 hr tablet Take 1 tablet (30 mg total) by mouth daily.  30 tablet  11  . labetalol (NORMODYNE) 100 MG tablet TAKE 1 TABLET BY MOUTH TWICE DAILY  60 tablet  0  . latanoprost (XALATAN) 0.005 % ophthalmic solution Place 1 drop into both eyes at bedtime.      . metolazone (ZAROXOLYN) 2.5 MG tablet       . nitroGLYCERIN (NITROSTAT) 0.4 MG SL tablet Place 0.4 mg under the tongue every 5 (five) minutes as needed for chest pain.       . pantoprazole (PROTONIX) 40 MG tablet Take 1 tablet (40 mg total) by mouth 2 (two) times daily.  60 tablet  11  . polyethylene glycol (MIRALAX / GLYCOLAX) packet Take 17 g by mouth daily as needed for mild constipation.  14 each  0  . potassium chloride SA (K-DUR,KLOR-CON) 20 MEQ tablet Take 20 mEq by mouth daily.      . sertraline (ZOLOFT) 100 MG tablet TAKE 1 TABLET BY MOUTH EVERY DAY  30 tablet  0  . sulfamethoxazole-trimethoprim (BACTRIM DS) 800-160 MG per tablet        No  current facility-administered medications for this visit.    Allergies:    Allergies  Allergen Reactions  . Iohexol      Code: RASH, Desc: [place] ON PT'S CHART ALLERGIC TO IV DYE 09/04/07/RM, Onset Date: [id number]     Social History:  The patient  reports that she has never smoked. She has never used smokeless tobacco. She reports that she does not drink alcohol or use illicit drugs.   ROS:  Please see the history of present illness.      All other systems reviewed and negative.   PHYSICAL EXAM: VS:  BP 140/50  Pulse 66  Ht 5\' 6"  (1.676 m)  Wt 204 lb (92.534 kg)  BMI 32.94 kg/m2  SpO2 95% Well nourished, well developed, in no acute distress HEENT: normal Neck: no JVD at 90 Cardiac:  normal S1, S2; RRR; no murmur Lungs:  clear to auscultation bilaterally, no wheezing, rhonchi or rales Abd: soft, nontender Ext: mild RLE edema at the ankles Skin: warm and dry Neuro:  CNs 2-12 intact, no focal abnormalities noted  EKG:  NSR, HR 60, inferolateral T wave changes, no significant change since prior tracing, QTc 490      ASSESSMENT AND PLAN:  1. Chronic Combined Systolic and Diastolic CHF:  Patient is currently decreased dose of furosemide 80 mg daily and metolazone being held. This is because of worsening creatinine from 1.4-2.4. We will decide about further diuretic management based on lab results. We will check her creatinine and potassium today. She is currently euvolemic.  Physical follow her in 2 months with creatinine potassium check at  that time.  2. CAD:   The patient has exertional typical chest pain. Scar of prior nuclear stress testing with left ventricular ejection fraction 37%, cardiac cath was never performed because of her chronic kidney disease. We'll schedule a Lexiscan nuclear stress test to rule out ischemia. Continue ASA and statin.    3. Cardiomyopathy:  No ACE or ARB due to CKD.  Continue beta blocker, hydralazine, nitrates.    4. CKD:  Followed by  nephrology.  5. Hypertension:  uncontrolled.  But controlled at her PCP office. We are adding metolazone and see blood pressure the next visit.   6. Hyperlipidemia:  Continue statin.  7. PAD, s/p L BKA:  F/u with [facility] as planned.   8. COPD - on Advair - added Albuterol PRN  9. nasal congestion - we will start patient on Claritin 10 mg daily.  10. Hypokalemia - recheck today.  Disposition:  F/u in 2 month with BMP.      Dorothy Spark  04/13/2014 11:57 AM

## 2014-04-15 ENCOUNTER — Ambulatory Visit: Payer: Medicare Other | Admitting: Cardiology

## 2014-04-20 ENCOUNTER — Other Ambulatory Visit: Payer: Medicare Other

## 2014-04-28 ENCOUNTER — Other Ambulatory Visit: Payer: Self-pay | Admitting: Adult Health

## 2014-04-28 ENCOUNTER — Other Ambulatory Visit: Payer: Self-pay | Admitting: Endocrinology

## 2014-04-29 ENCOUNTER — Other Ambulatory Visit: Payer: Self-pay | Admitting: Endocrinology

## 2014-05-06 ENCOUNTER — Other Ambulatory Visit: Payer: Self-pay | Admitting: Endocrinology

## 2014-05-14 ENCOUNTER — Other Ambulatory Visit: Payer: Self-pay | Admitting: Endocrinology

## 2014-05-19 ENCOUNTER — Encounter: Payer: Self-pay | Admitting: Endocrinology

## 2014-05-19 ENCOUNTER — Ambulatory Visit (INDEPENDENT_AMBULATORY_CARE_PROVIDER_SITE_OTHER): Payer: Medicare Other | Admitting: Endocrinology

## 2014-05-19 VITALS — BP 116/57 | HR 64 | Temp 98.2°F | Ht 66.0 in | Wt 221.2 lb

## 2014-05-19 DIAGNOSIS — IMO0002 Reserved for concepts with insufficient information to code with codable children: Secondary | ICD-10-CM

## 2014-05-19 DIAGNOSIS — E1129 Type 2 diabetes mellitus with other diabetic kidney complication: Secondary | ICD-10-CM

## 2014-05-19 DIAGNOSIS — E1165 Type 2 diabetes mellitus with hyperglycemia: Secondary | ICD-10-CM

## 2014-05-19 DIAGNOSIS — Z0000 Encounter for general adult medical examination without abnormal findings: Secondary | ICD-10-CM

## 2014-05-19 DIAGNOSIS — Z23 Encounter for immunization: Secondary | ICD-10-CM

## 2014-05-19 DIAGNOSIS — M810 Age-related osteoporosis without current pathological fracture: Secondary | ICD-10-CM

## 2014-05-19 LAB — HEMOGLOBIN A1C: HEMOGLOBIN A1C: 7.6 % — AB (ref 4.6–6.5)

## 2014-05-19 MED ORDER — DONEPEZIL HCL 5 MG PO TABS: 5.0000 mg | ORAL_TABLET | Freq: Every day | ORAL | Status: DC

## 2014-05-19 NOTE — Patient Instructions (Addendum)
blood tests are being requested for you today.  We'll contact you with results.    check your blood sugar twice a day.  vary the time of day when you check, between before the 3 meals, and at bedtime.  also check if you have symptoms of your blood sugar being too high or too low.  please keep a record of the readings and bring it to your next appointment here.  You can write it on any piece of paper.  please call us sooner if your blood sugar goes below 70, or if you have a lot of readings over 200.   Please come back for a follow-up appointment in 3 months.  i have sent a prescription to your pharmacy, for the memory. [facility] call [facility] at [phone], to make an appointment for a mammogram

## 2014-05-19 NOTE — Progress Notes (Signed)
Subjective:    Patient ID: [name], female    DOB: December 03, 1929, 80 y.o.   MRN: [id number]  HPI Pt returns for f/u of diabetes mellitus: DM type: Insulin-requiring type 2 Dx'ed: 4098 Complications: polyneuropathy, nephropathy, CAD, and PAD Therapy: insulin since GDM: never DKA: never Severe hypoglycemia: last episode was in 2014 Pancreatitis: never Other: therapy is limited by pt's request for least expensive insulin, and by her need for a simple regimen; husband administers insulin to her. Interval history: husb says he often gives her 15 units in the evening, due to high cbg's in the afternoon.  no cbg record, but states cbg's vary from 84-158.  pt states she feels well in general.   Past Medical History  Diagnosis Date  . Uterine cancer   . Diabetes mellitus     type II; peripheral neuropathy  . Hypertension   . GERD (gastroesophageal reflux disease)   . Peripheral vascular disease     s/p L BKA 08/2012  . Obesity   . Dyslipidemia   . Diastolic CHF, chronic     EF 50-55%, mild LVH and grade 1 diast. Dysfxn  . Osteoarthritis cervical spine   . DM retinopathy   . Sleep apnea     with CPAP  . History of shingles   . COPD (chronic obstructive pulmonary disease)   . Depression   . Peptic ulcer disease     duodenal  . Peptic stricture of esophagus   . Hiatal hernia   . Diverticulosis   . Arthritis   . ASCVD (arteriosclerotic cardiovascular disease)     on MRI brin  . CAD (coronary artery disease)     a. s/p multiple caths with nonobs CAD;   b. cath 1/10: pLAD 20%, mLAD 40%, pCFX 20%, mCFX 40%, pRCA 60-70%;   c.  Myoview 06/02/12: Low anterior wall scar, no ischemia, EF 37%  . Cardiomyopathy     a. Echo 05/31/12: Severe LVH, EF 40-45%, diffuse HK, grade 1 diastolic dysfunction, mild to moderate calcified aortic valve annulus, mild to moderate aortic stenosis, MAC, mild MR, moderate LAE  . Asthma     Mild  . Pruritic condition     Idiopathic  .  Hyperlipidemia   . Zoster   . Noncompliance   . CKD (chronic kidney disease)     Dr. Lorrene Reid    Past Surgical History  Procedure Laterality Date  . Tubal ligation  1967  . Knee arthroscopy  10/1998    Left  . Craniotomy  1997    Left for SDH  . Cataract extraction, bilateral  2005  . Hernia repair    . Esophagogastroduodenoscopy  04/04/2004  . Spine surgery      C-spine and lumbar surgery  . Cholecystectomy  2010  . Cardiac catheterization    . Dexa  7/05  . Abdominal hysterectomy    . Amputation Left 09/04/2012    Procedure: AMPUTATION BELOW KNEE;  Surgeon: Angelia Mould, MD;  Location: [facility];  Service: Vascular;  Laterality: Left;    History   Social History  . Marital Status: Married    Spouse Name: N/A    Number of Children: 7  . Years of Education: N/A   Occupational History  . Retired    Social History Main Topics  . Smoking status: Never Smoker   . Smokeless tobacco: Never Used  . Alcohol Use: No     Comment: rare  . Drug Use: No  . Sexual  Activity: No   Other Topics Concern  . Not on file   Social History Narrative    Current Outpatient Prescriptions on File Prior to Visit  Medication Sig Dispense Refill  . acetaminophen (TYLENOL) 500 MG tablet Take 500 mg by mouth every 6 (six) hours as needed for pain. For pain    . albuterol (PROVENTIL HFA;VENTOLIN HFA) 108 (90 BASE) MCG/ACT inhaler Inhale 2 puffs into the lungs every 4 (four) hours as needed for wheezing or shortness of breath. 1 Inhaler 3  . albuterol (PROVENTIL) (2.5 MG/3ML) 0.083% nebulizer solution Take 3 mLs (2.5 mg total) by nebulization every 4 (four) hours as needed for wheezing or shortness of breath. 75 mL 12  . allopurinol (ZYLOPRIM) 100 MG tablet Take 1 tablet (100 mg total) by mouth daily. 30 tablet 11  . aspirin 325 MG tablet Take 325 mg by mouth daily.    . bisacodyl (DULCOLAX) 5 MG EC tablet Take 1 tablet (5 mg total) by mouth daily as needed for moderate constipation. 30  tablet 0  . calcitRIOL (ROCALTROL) 0.25 MCG capsule TAKE ONE CAPSULE BY MOUTH EVERY DAY 30 capsule 0  . docusate sodium (COLACE) 100 MG capsule Take 100 mg by mouth 2 (two) times daily as needed for constipation.     Marland Kitchen ezetimibe (ZETIA) 10 MG tablet Take 1 tablet (10 mg total) by mouth every morning. 30 tablet 11  . feeding supplement (GLUCERNA SHAKE) LIQD Take 237 mLs by mouth 2 (two) times daily.    . Fluticasone-Salmeterol (ADVAIR DISKUS) 100-50 MCG/DOSE AEPB Inhale 1 puff into the lungs 2 (two) times daily. 1 each 11  . furosemide (LASIX) 80 MG tablet Take 1 tablet (80 mg total) by mouth daily. 90 tablet 3  . gabapentin (NEURONTIN) 300 MG capsule TAKE ONE CAPSULE BY MOUTH AT BEDTIME 30 capsule 0  . glucose blood (ONE TOUCH ULTRA TEST) test strip Use 2 times per day 100 each 2  . hydrALAZINE (APRESOLINE) 25 MG tablet TAKE 1 TABLET BY MOUTH THREE DAILY 90 tablet 0  . HYDROcodone-acetaminophen (NORCO) 10-325 MG per tablet Take one tablet by mouth every 4 hours as needed for pain 180 tablet 0  . insulin NPH-regular Human (NOVOLIN 70/30) (70-30) 100 UNIT/ML injection 15 units with breakfast, and 10 units with the evening meal. (Patient taking differently: 18 units with breakfast, and 10 units with the evening meal.) 10 mL 2  . isosorbide mononitrate (IMDUR) 30 MG 24 hr tablet Take 1 tablet (30 mg total) by mouth daily. 30 tablet 11  . labetalol (NORMODYNE) 100 MG tablet TAKE 1 TABLET BY MOUTH TWICE DAILY 60 tablet 0  . latanoprost (XALATAN) 0.005 % ophthalmic solution Place 1 drop into both eyes at bedtime.    . nitroGLYCERIN (NITROSTAT) 0.4 MG SL tablet Place 0.4 mg under the tongue every 5 (five) minutes as needed for chest pain.     . pantoprazole (PROTONIX) 40 MG tablet TAKE 1 TABLET BY MOUTH TWICE DAILY 60 tablet 0  . polyethylene glycol (MIRALAX / GLYCOLAX) packet Take 17 g by mouth daily as needed for mild constipation. 14 each 0  . potassium chloride SA (K-DUR,KLOR-CON) 20 MEQ tablet Take 20  mEq by mouth daily.    . sertraline (ZOLOFT) 100 MG tablet TAKE 1 TABLET BY MOUTH EVERY DAY 30 tablet 0   No current facility-administered medications on file prior to visit.    Allergies  Allergen Reactions  . Iohexol      Code: RASH, Desc: [place] ON PT'S CHART ALLERGIC TO IV DYE 09/04/07/RM, Onset Date: [id number]     Family History  Problem Relation Age of Onset  . Cancer - Other Mother     "Stomach" Cancer  . Diabetes Mother   . Heart disease Mother   . Stomach cancer Mother   . Hypertension Mother   . Lymphoma Father   . Hypertension Father   . Kidney disease Paternal Grandmother   . Asthma Other   . Diabetes Sister   . Rheum arthritis Mother   . Rheum arthritis Father     BP 116/57 mmHg  Pulse 64  Temp(Src) 98.2 F (36.8 C) (Oral)  Ht 5\' 6"  (1.676 m)  Wt 221 lb 3.2 oz (100.336 kg)  BMI 35.72 kg/m2  Review of Systems Denies hypoglycemia and weight change.      Objective:   Physical Exam VITAL SIGNS:  See vs page GENERAL: no distress.  In wheelchair.   Pulses: right dorsalis pedis is intact.  LLE: BKA.   Right foot: no deformity.  1+ leg edema. There is bilateral onychomycosis.  Skin: no ulcer, and normal color and temp, on the right foot.  Neuro: sensation is intact to touch on the right foot.    Lab Results  Component Value Date   HGBA1C 7.6* 05/19/2014      Assessment & Plan:  DM: mild exacerbation: however, this is the best control this pt should aim for, given this regimen, which does match insulin to her changing needs throughout the day.  Based on the pattern of her cbg's, she needs some adjustment in her therapy.  change insulin to 18 units with breakfast, and 10 units with the evening meal. Noncompliance with cbg recording: I'll work around this as best I can.    Subjective:   Patient here for Medicare annual wellness visit and management of other chronic and acute problems.     Risk factors: advanced age    [employer] [facility] [employer]  Providing Medical Care to Patient:  See "snapshot"   Activities of Daily Living: In your present state of health, do you have any difficulty performing the following activities (husb is always with pt)?:  Preparing food and eating?: yes  Bathing yourself: yes  Getting dressed: yes  Using the toilet: no  Moving around from place to place: yes In the past year have you fallen or had a near fall?: No    Home Safety: Has smoke detector and wears seat belts. No firearms. No excess sun exposure.  Diet and Exercise  Current exercise habits: limited by health probs Dietary issues discussed: pt reports a healthy diet   Depression Screen  Q1: Over the past two weeks, have you felt down, depressed or hopeless? no  Q2: Over the past two weeks, have you felt little interest or pleasure in doing things? no   The following portions of the patient's history were reviewed and updated as appropriate: allergies, current medications, past family history, past medical history, past social history, past surgical history and problem list.   Review of Systems  Denies hearing loss, and visual loss Objective:   Vision:  Sees opthalmologist Hearing: grossly normal Body mass index:  See vs page Msk: pt easily and quickly performs "get-up-and-go" from a sitting position Cognitive Impairment Assessment: cognition, memory and judgment appear normal.  remembers 1/3 at 5 minutes.  excellent recall.  can easily read and write a sentence.  alert and oriented x 3.     Assessment:  Medicare wellness utd on preventive parameters    Plan:   During the course of the visit the patient was educated and counseled about appropriate screening and preventive services including:        Fall prevention   Screening mammography  Bone densitometry screening  Diabetes screening  Nutrition counseling   Vaccines / LABS Zostavax / Pneumococcal Vaccine  today   Patient Instructions (the written plan) was given to the  patient.

## 2014-05-23 ENCOUNTER — Other Ambulatory Visit: Payer: Self-pay | Admitting: Endocrinology

## 2014-05-23 DIAGNOSIS — Z1231 Encounter for screening mammogram for malignant neoplasm of breast: Secondary | ICD-10-CM

## 2014-05-26 ENCOUNTER — Encounter (HOSPITAL_COMMUNITY): Payer: Self-pay | Admitting: Vascular Surgery

## 2014-05-26 ENCOUNTER — Other Ambulatory Visit: Payer: Self-pay | Admitting: Endocrinology

## 2014-05-27 ENCOUNTER — Telehealth: Payer: Self-pay | Admitting: Endocrinology

## 2014-05-27 NOTE — Telephone Encounter (Signed)
[name] from Dr. Buelah Manis # [phone]  Please call [name] back she needs to know whether Dr. Loanne Drilling is ok with sedation for his oral surgery also does the pt need premed and what are Dr. Cordelia Pen recommendations.

## 2014-05-27 NOTE — Telephone Encounter (Signed)
See below and please advise, Thanks!  

## 2014-05-28 NOTE — Telephone Encounter (Signed)
Yes, pt is ok for sedation

## 2014-05-30 NOTE — Telephone Encounter (Signed)
Contacted Dentist office and advised pt is cleared to be sedated.

## 2014-06-01 ENCOUNTER — Telehealth: Payer: Self-pay

## 2014-06-01 NOTE — Telephone Encounter (Addendum)
Dr. Dorian Heckle office called requesting a medical clearance letter stating the pt is ok to under go the oral surgery that requires sedation. Please advise, Thanks!

## 2014-06-02 NOTE — Telephone Encounter (Signed)
i printed 

## 2014-06-02 NOTE — Telephone Encounter (Signed)
Letter faxed to Dr. Dorian Heckle office.

## 2014-06-03 ENCOUNTER — Telehealth: Payer: Self-pay | Admitting: *Deleted

## 2014-06-03 ENCOUNTER — Ambulatory Visit (HOSPITAL_COMMUNITY)
Admission: RE | Admit: 2014-06-03 | Discharge: 2014-06-03 | Disposition: A | Discharge status: Home / Self Care | Payer: Medicare Other | Source: Ambulatory Visit | Attending: Endocrinology | Admitting: Endocrinology

## 2014-06-03 DIAGNOSIS — Z1231 Encounter for screening mammogram for malignant neoplasm of breast: Secondary | ICD-10-CM | POA: Diagnosis not present

## 2014-06-03 NOTE — Telephone Encounter (Signed)
surg clear form requested, brittany said she would send it over.Marland Kitchen

## 2014-06-06 ENCOUNTER — Telehealth: Payer: Self-pay | Admitting: Endocrinology

## 2014-06-06 NOTE — Telephone Encounter (Signed)
Called rhonda and she is resending over clearance.Marland Kitchen

## 2014-06-06 NOTE — Telephone Encounter (Signed)
Patient states that [employer] needs a copy of her last exam    Thank you

## 2014-06-06 NOTE — Telephone Encounter (Signed)
[employer] advised to contact medical records to get a copy of the last office note. Called pt's husband and advised of the same. Medical records # given.

## 2014-06-06 NOTE — Telephone Encounter (Signed)
Med release request received.Marland KitchenMarland Kitchen

## 2014-06-08 ENCOUNTER — Ambulatory Visit (INDEPENDENT_AMBULATORY_CARE_PROVIDER_SITE_OTHER): Payer: Medicare Other | Admitting: Cardiology

## 2014-06-08 ENCOUNTER — Encounter: Payer: Self-pay | Admitting: Cardiology

## 2014-06-08 VITALS — BP 164/78 | HR 59 | Ht 66.0 in | Wt 218.8 lb

## 2014-06-08 DIAGNOSIS — R0600 Dyspnea, unspecified: Secondary | ICD-10-CM

## 2014-06-08 DIAGNOSIS — I5042 Chronic combined systolic (congestive) and diastolic (congestive) heart failure: Secondary | ICD-10-CM

## 2014-06-08 DIAGNOSIS — I10 Essential (primary) hypertension: Secondary | ICD-10-CM

## 2014-06-08 DIAGNOSIS — I25709 Atherosclerosis of coronary artery bypass graft(s), unspecified, with unspecified angina pectoris: Secondary | ICD-10-CM

## 2014-06-08 DIAGNOSIS — I5043 Acute on chronic combined systolic (congestive) and diastolic (congestive) heart failure: Secondary | ICD-10-CM

## 2014-06-08 DIAGNOSIS — R0602 Shortness of breath: Secondary | ICD-10-CM

## 2014-06-08 DIAGNOSIS — R609 Edema, unspecified: Secondary | ICD-10-CM

## 2014-06-08 DIAGNOSIS — R0609 Other forms of dyspnea: Secondary | ICD-10-CM

## 2014-06-08 NOTE — Patient Instructions (Signed)
Your physician recommends that you continue on your current medications as directed. Please refer to the Current Medication list given to you today.    Your physician recommends that you return for lab work in: [place] [facility]     Your physician recommends that you schedule a follow-up appointment in: [place]

## 2014-06-08 NOTE — Progress Notes (Signed)
Patient ID: [name], female   DOB: 05-06-1930, 80 y.o.   MRN: [id number] Patient ID: [name], female   DOB: 05-29-1930, 80 y.o.   MRN: [id number]    Date:  06/08/2014   ID:  [name], DOB March 08, 1930, MRN [id number]  PCP:  Renato Shin, MD  Cardiologist:  Dr. Jenell Milliner , last seen by Richardson Dopp in July 2014    History of Present Illness: [name] is a 80 y.o. female who returns for f/u.    She has a h/o diastolic CHF, nonobstructive CAD, DM2, HTN, HL, sleep apnea and CKD. Admitted to [facility] 07/945 with a/c diastolic CHF and with elevated enzymes that were 2/2 CHF + CKD. Admitted with CP in 05/2012.  She r/o for MI.  Echo 05/31/12: Severe LVH, EF 40-45%, diffuse HK, grade 1 diastolic dysfunction, mild to moderate calcified aortic valve annulus, mild to moderate aortic stenosis, MAC, mild MR, moderate LAE.  Myoview was arranged for risk stratification. Myoview 06/02/12: Low anterior wall scar, no ischemia, EF 37%. It is felt that she would be high risk for contrast-induced nephropathy given her CKD.  Last seen in this office 06/16/2012.  Since then, she presented to the hospital in 08/2012 with an ischemic left leg.  She ultimately underwent L BKA with Dr. Scot Dock.  Since she saw Richardson Dopp in July 2014 she had her left leg amputated above her knee for peripheral arterial disease. Patient is coming today for followup she reports of worsening dyspnea. The patient has been seen by her pulmonologist on just 2 days ago and her CPAP settings have been adjusted but haven't been changed on her machine yet. The patient also states that for the last couple of weeks edema on her right lower extremity has been worsening. She is also experiencing chest pain at that time when she feels dyspnea on exertion.  Sleeps on 2 pillows without change.  No PND.  She is taking all her medications and states that her blood pressure is usually controlled and her primary care  doctor's office.   On 08/05/2013 - improved SOB, feels better after CPAP parameters were adjusted. LE edema has improved. She feels well. No palpitations.  The only complains is wheezing after she wakes up, she uses Advair inhaler twice daily.   11/15/2013 - the patient reports that her lower extremity edema has improved however her shortness of breath has persisted and she is experiencing paroxysmal nocturnal dyspnea about twice a week. She has had an episode of sinusitis with productive cough that has resolved about a week ago. She still experiencing nasal congestion.  01/13/2014 - the patient states that her lower extremity edema and shortness of breath got significantly better she still experiences chest pain and chest tightness on exertion. She is currently in rehabilitation as she fell and is practicing walking again. She is status post left above-knee amputation.  04/13/14 - the patient is coming for 3 months follow-up, she is down 12 pounds as the last visit. She had her labs checked last week with worsening creatinine currently 2.4 previously 1.4. Also her potassium was 3.0 we decreased furosemide 80 mg twice a day to 80 mg daily and held metolazone. We also increased her potassium. She states that she is going to the gym on a regular basis and is able to walk and enjoy her life. She denies any chest pain or shortness of breath. She also denies orthopnea or proximal nocturnal dyspnea.  06/08/2014 - the  patient is coming after 2 months, she is complaining of worsening lower extremity edema in her right lower extremity she has a prosthetic cyst in her left leg. She has also been experiencing progressively worsening shortness of breath in the last 2-3 months. Her Lasix was decreased in on the end of October on because her creatinine raised from 1.4 - 2.4. She will require a dental procedure for root canal. She will need a clearance for that. She is also experiencing orthopnea or paroxysmal nocturnal  dyspnea.  Wt Readings from Last 3 Encounters:  06/08/14 218 lb 12.8 oz (99.247 kg)  05/19/14 221 lb 3.2 oz (100.336 kg)  04/13/14 204 lb (92.534 kg)    Past Medical History  Diagnosis Date  . Uterine cancer   . Diabetes mellitus     type II; peripheral neuropathy  . Hypertension   . GERD (gastroesophageal reflux disease)   . Peripheral vascular disease     s/p L BKA 08/2012  . Obesity   . Dyslipidemia   . Diastolic CHF, chronic     EF 50-55%, mild LVH and grade 1 diast. Dysfxn  . Osteoarthritis cervical spine   . DM retinopathy   . Sleep apnea     with CPAP  . History of shingles   . COPD (chronic obstructive pulmonary disease)   . Depression   . Peptic ulcer disease     duodenal  . Peptic stricture of esophagus   . Hiatal hernia   . Diverticulosis   . Arthritis   . ASCVD (arteriosclerotic cardiovascular disease)     on MRI brin  . CAD (coronary artery disease)     a. s/p multiple caths with nonobs CAD;   b. cath 1/10: pLAD 20%, mLAD 40%, pCFX 20%, mCFX 40%, pRCA 60-70%;   c.  Myoview 06/02/12: Low anterior wall scar, no ischemia, EF 37%  . Cardiomyopathy     a. Echo 05/31/12: Severe LVH, EF 40-45%, diffuse HK, grade 1 diastolic dysfunction, mild to moderate calcified aortic valve annulus, mild to moderate aortic stenosis, MAC, mild MR, moderate LAE  . Asthma     Mild  . Pruritic condition     Idiopathic  . Hyperlipidemia   . Zoster   . Noncompliance   . CKD (chronic kidney disease)     Dr. Lorrene Reid    Current Outpatient Prescriptions  Medication Sig Dispense Refill  . acetaminophen (TYLENOL) 500 MG tablet Take 500 mg by mouth every 6 (six) hours as needed for pain. For pain    . albuterol (PROVENTIL HFA;VENTOLIN HFA) 108 (90 BASE) MCG/ACT inhaler Inhale 2 puffs into the lungs every 4 (four) hours as needed for wheezing or shortness of breath. 1 Inhaler 3  . albuterol (PROVENTIL) (2.5 MG/3ML) 0.083% nebulizer solution Take 3 mLs (2.5 mg total) by nebulization every  4 (four) hours as needed for wheezing or shortness of breath. 75 mL 12  . allopurinol (ZYLOPRIM) 100 MG tablet Take 1 tablet (100 mg total) by mouth daily. 30 tablet 11  . amoxicillin (AMOXIL) 500 MG capsule Take 500 mg by mouth 3 (three) times a week. For UTI    . aspirin 325 MG tablet Take 325 mg by mouth daily.    . bisacodyl (DULCOLAX) 5 MG EC tablet Take 1 tablet (5 mg total) by mouth daily as needed for moderate constipation. 30 tablet 0  . calcitRIOL (ROCALTROL) 0.25 MCG capsule TAKE ONE CAPSULE BY MOUTH EVERY DAY 30 capsule 0  . docusate sodium (COLACE)  100 MG capsule Take 100 mg by mouth 2 (two) times daily as needed for constipation.     Marland Kitchen donepezil (ARICEPT) 5 MG tablet Take 1 tablet (5 mg total) by mouth at bedtime. 30 tablet 11  . ezetimibe (ZETIA) 10 MG tablet Take 1 tablet (10 mg total) by mouth every morning. 30 tablet 11  . feeding supplement (GLUCERNA SHAKE) LIQD Take 237 mLs by mouth 2 (two) times daily.    . Fluticasone-Salmeterol (ADVAIR DISKUS) 100-50 MCG/DOSE AEPB Inhale 1 puff into the lungs 2 (two) times daily. 1 each 11  . furosemide (LASIX) 80 MG tablet Take 1 tablet (80 mg total) by mouth daily. 90 tablet 3  . gabapentin (NEURONTIN) 300 MG capsule TAKE 1 CAPSULE BY MOUTH AT BEDTIME 30 capsule 0  . glucose blood (ONE TOUCH ULTRA TEST) test strip Use 2 times per day 100 each 2  . hydrALAZINE (APRESOLINE) 25 MG tablet TAKE 1 TABLET BY MOUTH THREE DAILY 90 tablet 0  . HYDROcodone-acetaminophen (NORCO) 10-325 MG per tablet Take one tablet by mouth every 4 hours as needed for pain 180 tablet 0  . insulin NPH-regular Human (NOVOLIN 70/30) (70-30) 100 UNIT/ML injection 15 units with breakfast, and 10 units with the evening meal. (Patient taking differently: 18 units with breakfast, and 10 units with the evening meal.) 10 mL 2  . isosorbide mononitrate (IMDUR) 30 MG 24 hr tablet Take 1 tablet (30 mg total) by mouth daily. 30 tablet 11  . labetalol (NORMODYNE) 100 MG tablet TAKE 1  TABLET BY MOUTH TWICE DAILY 60 tablet 0  . labetalol (NORMODYNE) 100 MG tablet TAKE 1 TABLET BY MOUTH TWICE DAILY 60 tablet 0  . latanoprost (XALATAN) 0.005 % ophthalmic solution Place 1 drop into both eyes at bedtime.    . metolazone (ZAROXOLYN) 2.5 MG tablet Take 1 tablet by mouth daily.  11  . nitroGLYCERIN (NITROSTAT) 0.4 MG SL tablet Place 0.4 mg under the tongue every 5 (five) minutes as needed for chest pain.     . pantoprazole (PROTONIX) 40 MG tablet TAKE 1 TABLET BY MOUTH TWICE DAILY 60 tablet 0  . polyethylene glycol (MIRALAX / GLYCOLAX) packet Take 17 g by mouth daily as needed for mild constipation. 14 each 0  . potassium chloride SA (K-DUR,KLOR-CON) 20 MEQ tablet Take 20 mEq by mouth daily.    . sertraline (ZOLOFT) 100 MG tablet TAKE 1 TABLET BY MOUTH EVERY DAY 30 tablet 0   No current facility-administered medications for this visit.    Allergies:    Allergies  Allergen Reactions  . Iohexol      Code: RASH, Desc: Magnet ON PT'S CHART ALLERGIC TO IV DYE 09/04/07/RM, Onset Date: [id number]     Social History:  The patient  reports that she has never smoked. She has never used smokeless tobacco. She reports that she does not drink alcohol or use illicit drugs.   ROS:  Please see the history of present illness.      All other systems reviewed and negative.   PHYSICAL EXAM: VS:  BP 164/78 mmHg  Pulse 59  Ht 5\' 6"  (1.676 m)  Wt 218 lb 12.8 oz (99.247 kg)  BMI 35.33 kg/m2 Well nourished, well developed, in no acute distress HEENT: normal Neck: no JVD at 90 Cardiac:  normal S1, S2; RRR; no murmur Lungs:  clear to auscultation bilaterally, no wheezing, rhonchi or rales Abd: soft, nontender Ext: mild RLE edema at the ankles Skin: warm and dry Neuro:  CNs 2-12 intact, no focal abnormalities noted  EKG:  Sinus bradycardia, septal and lateral infarct, age undetermined, LAFB    ASSESSMENT AND PLAN:  1. Acute on chronic Combined Systolic and Diastolic CHF:     We'll obtain lab results from yesterday creatinine is now 1.6 on patient is clearly fluid overloaded we will increase Lasix to 80 mg by mouth twice a day. We will follow her on 06/24/2013. Also her BNP was elevated at 1200 previously 700. If she is okay and appears euvolemic at the next office visit we will clear her for her dental procedure.  2. CAD:   The patient has exertional typical chest pain. Scar of prior nuclear stress testing with left ventricular ejection fraction 37%, cardiac cath was never performed because of her chronic kidney disease. We'll schedule a Lexiscan nuclear stress test to rule out ischemia. Continue ASA and statin.    3. Cardiomyopathy:  No ACE or ARB due to CKD.  Continue beta blocker, hydralazine, nitrates.    4. CKD:  Followed by nephrology.  5. Hypertension:  uncontrolled.  But controlled at her PCP office. We are adding metolazone and see blood pressure the next visit.   6. Hyperlipidemia:  Continue statin.  7. PAD, s/p L BKA:  F/u with [facility] as planned.   8. COPD - on Advair - added Albuterol PRN  9. nasal congestion - we will start patient on Claritin 10 mg daily.  10. Hypokalemia - okay on today lap.  Disposition:  F/u in 2 weeks with BMP.   Dorothy Spark  06/08/2014 5:49 PM

## 2014-06-09 LAB — COMPREHENSIVE METABOLIC PANEL
ALT: 10 U/L (ref 0–35)
AST: 15 U/L (ref 0–37)
Albumin: 3.8 g/dL (ref 3.5–5.2)
Alkaline Phosphatase: 75 U/L (ref 39–117)
BUN: 20 mg/dL (ref 6–23)
CO2: 23 mEq/L (ref 19–32)
Calcium: 8.9 mg/dL (ref 8.4–10.5)
Chloride: 108 mEq/L (ref 96–112)
Creatinine, Ser: 1.6 mg/dL — ABNORMAL HIGH (ref 0.4–1.2)
GFR: 40.19 mL/min — ABNORMAL LOW (ref >60.00)
Glucose, Bld: 153 mg/dL — ABNORMAL HIGH (ref 70–99)
Potassium: 4.3 mEq/L (ref 3.5–5.1)
Sodium: 140 mEq/L (ref 135–145)
Total Bilirubin: 0.9 mg/dL (ref 0.2–1.2)
Total Protein: 6.7 g/dL (ref 6.0–8.3)

## 2014-06-09 LAB — BRAIN NATRIURETIC PEPTIDE: Pro B Natriuretic peptide (BNP): 1249 pg/mL — ABNORMAL HIGH (ref 0.0–100.0)

## 2014-06-10 ENCOUNTER — Other Ambulatory Visit: Payer: Self-pay | Admitting: Endocrinology

## 2014-06-17 ENCOUNTER — Inpatient Hospital Stay (HOSPITAL_COMMUNITY)
Admission: EM | Admit: 2014-06-17 | Discharge: 2014-06-27 | DRG: 280 | Disposition: A | Discharge status: Home / Self Care | Payer: Medicare Other | Attending: Cardiology | Admitting: Cardiology

## 2014-06-17 ENCOUNTER — Emergency Department (HOSPITAL_COMMUNITY): Payer: Medicare Other

## 2014-06-17 ENCOUNTER — Encounter (HOSPITAL_COMMUNITY): Payer: Self-pay | Admitting: Emergency Medicine

## 2014-06-17 DIAGNOSIS — J449 Chronic obstructive pulmonary disease, unspecified: Secondary | ICD-10-CM | POA: Diagnosis present

## 2014-06-17 DIAGNOSIS — I25810 Atherosclerosis of coronary artery bypass graft(s) without angina pectoris: Secondary | ICD-10-CM | POA: Diagnosis present

## 2014-06-17 DIAGNOSIS — T380X5A Adverse effect of glucocorticoids and synthetic analogues, initial encounter: Secondary | ICD-10-CM | POA: Diagnosis present

## 2014-06-17 DIAGNOSIS — Z9119 Patient's noncompliance with other medical treatment and regimen: Secondary | ICD-10-CM | POA: Diagnosis present

## 2014-06-17 DIAGNOSIS — I10 Essential (primary) hypertension: Secondary | ICD-10-CM

## 2014-06-17 DIAGNOSIS — E11319 Type 2 diabetes mellitus with unspecified diabetic retinopathy without macular edema: Secondary | ICD-10-CM | POA: Diagnosis present

## 2014-06-17 DIAGNOSIS — Z794 Long term (current) use of insulin: Secondary | ICD-10-CM

## 2014-06-17 DIAGNOSIS — R9439 Abnormal result of other cardiovascular function study: Secondary | ICD-10-CM | POA: Diagnosis present

## 2014-06-17 DIAGNOSIS — I5043 Acute on chronic combined systolic (congestive) and diastolic (congestive) heart failure: Secondary | ICD-10-CM | POA: Diagnosis present

## 2014-06-17 DIAGNOSIS — Z992 Dependence on renal dialysis: Secondary | ICD-10-CM

## 2014-06-17 DIAGNOSIS — Z89512 Acquired absence of left leg below knee: Secondary | ICD-10-CM

## 2014-06-17 DIAGNOSIS — I429 Cardiomyopathy, unspecified: Secondary | ICD-10-CM | POA: Diagnosis present

## 2014-06-17 DIAGNOSIS — E1122 Type 2 diabetes mellitus with diabetic chronic kidney disease: Secondary | ICD-10-CM | POA: Diagnosis present

## 2014-06-17 DIAGNOSIS — Z8711 Personal history of peptic ulcer disease: Secondary | ICD-10-CM

## 2014-06-17 DIAGNOSIS — Z8542 Personal history of malignant neoplasm of other parts of uterus: Secondary | ICD-10-CM | POA: Diagnosis not present

## 2014-06-17 DIAGNOSIS — R079 Chest pain, unspecified: Secondary | ICD-10-CM | POA: Diagnosis present

## 2014-06-17 DIAGNOSIS — M1990 Unspecified osteoarthritis, unspecified site: Secondary | ICD-10-CM | POA: Diagnosis present

## 2014-06-17 DIAGNOSIS — Z7982 Long term (current) use of aspirin: Secondary | ICD-10-CM

## 2014-06-17 DIAGNOSIS — N183 Chronic kidney disease, stage 3 (moderate): Secondary | ICD-10-CM | POA: Diagnosis present

## 2014-06-17 DIAGNOSIS — I2510 Atherosclerotic heart disease of native coronary artery without angina pectoris: Secondary | ICD-10-CM | POA: Diagnosis present

## 2014-06-17 DIAGNOSIS — F329 Major depressive disorder, single episode, unspecified: Secondary | ICD-10-CM | POA: Diagnosis present

## 2014-06-17 DIAGNOSIS — E669 Obesity, unspecified: Secondary | ICD-10-CM | POA: Diagnosis present

## 2014-06-17 DIAGNOSIS — Z6833 Body mass index (BMI) 33.0-33.9, adult: Secondary | ICD-10-CM | POA: Diagnosis not present

## 2014-06-17 DIAGNOSIS — I214 Non-ST elevation (NSTEMI) myocardial infarction: Principal | ICD-10-CM | POA: Diagnosis present

## 2014-06-17 DIAGNOSIS — D649 Anemia, unspecified: Secondary | ICD-10-CM | POA: Diagnosis present

## 2014-06-17 DIAGNOSIS — G4733 Obstructive sleep apnea (adult) (pediatric): Secondary | ICD-10-CM | POA: Diagnosis present

## 2014-06-17 DIAGNOSIS — R001 Bradycardia, unspecified: Secondary | ICD-10-CM | POA: Diagnosis present

## 2014-06-17 DIAGNOSIS — I129 Hypertensive chronic kidney disease with stage 1 through stage 4 chronic kidney disease, or unspecified chronic kidney disease: Secondary | ICD-10-CM | POA: Diagnosis present

## 2014-06-17 DIAGNOSIS — R0602 Shortness of breath: Secondary | ICD-10-CM | POA: Insufficient documentation

## 2014-06-17 DIAGNOSIS — N179 Acute kidney failure, unspecified: Secondary | ICD-10-CM | POA: Diagnosis present

## 2014-06-17 DIAGNOSIS — E785 Hyperlipidemia, unspecified: Secondary | ICD-10-CM | POA: Diagnosis present

## 2014-06-17 DIAGNOSIS — E1129 Type 2 diabetes mellitus with other diabetic kidney complication: Secondary | ICD-10-CM

## 2014-06-17 DIAGNOSIS — I5042 Chronic combined systolic (congestive) and diastolic (congestive) heart failure: Secondary | ICD-10-CM

## 2014-06-17 DIAGNOSIS — N186 End stage renal disease: Secondary | ICD-10-CM | POA: Diagnosis present

## 2014-06-17 DIAGNOSIS — K219 Gastro-esophageal reflux disease without esophagitis: Secondary | ICD-10-CM | POA: Diagnosis present

## 2014-06-17 DIAGNOSIS — E1165 Type 2 diabetes mellitus with hyperglycemia: Secondary | ICD-10-CM | POA: Diagnosis present

## 2014-06-17 DIAGNOSIS — E11649 Type 2 diabetes mellitus with hypoglycemia without coma: Secondary | ICD-10-CM | POA: Diagnosis present

## 2014-06-17 DIAGNOSIS — I2583 Coronary atherosclerosis due to lipid rich plaque: Secondary | ICD-10-CM

## 2014-06-17 DIAGNOSIS — E875 Hyperkalemia: Secondary | ICD-10-CM | POA: Diagnosis present

## 2014-06-17 DIAGNOSIS — E1151 Type 2 diabetes mellitus with diabetic peripheral angiopathy without gangrene: Secondary | ICD-10-CM | POA: Diagnosis present

## 2014-06-17 DIAGNOSIS — J45909 Unspecified asthma, uncomplicated: Secondary | ICD-10-CM | POA: Diagnosis present

## 2014-06-17 DIAGNOSIS — Z9049 Acquired absence of other specified parts of digestive tract: Secondary | ICD-10-CM | POA: Diagnosis present

## 2014-06-17 DIAGNOSIS — E1159 Type 2 diabetes mellitus with other circulatory complications: Secondary | ICD-10-CM | POA: Diagnosis present

## 2014-06-17 DIAGNOSIS — Z91041 Radiographic dye allergy status: Secondary | ICD-10-CM

## 2014-06-17 DIAGNOSIS — I152 Hypertension secondary to endocrine disorders: Secondary | ICD-10-CM | POA: Diagnosis present

## 2014-06-17 LAB — CBC WITH DIFFERENTIAL/PLATELET
Basophils Absolute: 0 10*3/uL (ref 0.0–0.1)
Basophils Absolute: 0 10*3/uL (ref 0.0–0.1)
Basophils Relative: 1 % (ref 0–1)
Basophils Relative: 0 % (ref 0–1)
EOS ABS: 0.2 10*3/uL (ref 0.0–0.7)
Eosinophils Absolute: 0.2 10*3/uL (ref 0.0–0.7)
Eosinophils Relative: 3 % (ref 0–5)
Eosinophils Relative: 2 % (ref 0–5)
HCT: 41.4 % (ref 36.0–46.0)
HEMATOCRIT: 41.5 % (ref 36.0–46.0)
HEMOGLOBIN: 13.2 g/dL (ref 12.0–15.0)
HEMOGLOBIN: 13.5 g/dL (ref 12.0–15.0)
Lymphocytes Relative: 16 % (ref 12–46)
Lymphocytes Relative: 20 % (ref 12–46)
Lymphs Abs: 1 10*3/uL (ref 0.7–4.0)
Lymphs Abs: 1.5 10*3/uL (ref 0.7–4.0)
MCH: 28.5 pg (ref 26.0–34.0)
MCH: 28.6 pg (ref 26.0–34.0)
MCHC: 31.9 g/dL (ref 30.0–36.0)
MCHC: 32.5 g/dL (ref 30.0–36.0)
MCV: 89.4 fL (ref 78.0–100.0)
MCV: 87.9 fL (ref 78.0–100.0)
MONO ABS: 0.4 10*3/uL (ref 0.1–1.0)
MONO ABS: 0.5 10*3/uL (ref 0.1–1.0)
MONOS PCT: 7 % (ref 3–12)
Monocytes Relative: 6 % (ref 3–12)
NEUTROS ABS: 4.9 10*3/uL (ref 1.7–7.7)
NEUTROS PCT: 75 % (ref 43–77)
NEUTROS PCT: 71 % (ref 43–77)
Neutro Abs: 5.4 10*3/uL (ref 1.7–7.7)
PLATELETS: 274 10*3/uL (ref 150–400)
Platelets: 270 10*3/uL (ref 150–400)
RBC: 4.63 MIL/uL (ref 3.87–5.11)
RBC: 4.72 MIL/uL (ref 3.87–5.11)
RDW: 16.6 % — ABNORMAL HIGH (ref 11.5–15.5)
RDW: 16.1 % — AB (ref 11.5–15.5)
WBC: 6.5 10*3/uL (ref 4.0–10.5)
WBC: 7.6 10*3/uL (ref 4.0–10.5)

## 2014-06-17 LAB — PROTIME-INR
INR: 1.06 (ref 0.00–1.49)
INR: 1.19 (ref 0.00–1.49)
PROTHROMBIN TIME: 15.3 s — AB (ref 11.6–15.2)
Prothrombin Time: 13.9 seconds (ref 11.6–15.2)

## 2014-06-17 LAB — TROPONIN I
TROPONIN I: 2.57 ng/mL — AB (ref <0.031)
Troponin I: 2.4 ng/mL (ref <0.031)
Troponin I: 2.71 ng/mL (ref <0.031)

## 2014-06-17 LAB — COMPREHENSIVE METABOLIC PANEL
ALT: 24 U/L (ref 0–35)
ALT: 22 U/L (ref 0–35)
AST: 35 U/L (ref 0–37)
AST: 34 U/L (ref 0–37)
Albumin: 3.7 g/dL (ref 3.5–5.2)
Albumin: 3.8 g/dL (ref 3.5–5.2)
Alkaline Phosphatase: 102 U/L (ref 39–117)
Alkaline Phosphatase: 103 U/L (ref 39–117)
Anion gap: 10 (ref 5–15)
Anion gap: 9 (ref 5–15)
BUN: 18 mg/dL (ref 6–23)
BUN: 14 mg/dL (ref 6–23)
CALCIUM: 8.9 mg/dL (ref 8.4–10.5)
CALCIUM: 9.4 mg/dL (ref 8.4–10.5)
CO2: 24 mmol/L (ref 19–32)
CO2: 27 mmol/L (ref 19–32)
CREATININE: 1.33 mg/dL — AB (ref 0.50–1.10)
Chloride: 107 mEq/L (ref 96–112)
Chloride: 102 mEq/L (ref 96–112)
Creatinine, Ser: 1.39 mg/dL — ABNORMAL HIGH (ref 0.50–1.10)
GFR calc Af Amer: 42 mL/min — ABNORMAL LOW (ref >90)
GFR calc non Af Amer: 36 mL/min — ABNORMAL LOW (ref >90)
GFR, EST AFRICAN AMERICAN: 40 mL/min — AB (ref >90)
GFR, EST NON AFRICAN AMERICAN: 34 mL/min — AB (ref >90)
Glucose, Bld: 230 mg/dL — ABNORMAL HIGH (ref 70–99)
Glucose, Bld: 200 mg/dL — ABNORMAL HIGH (ref 70–99)
POTASSIUM: 4.6 mmol/L (ref 3.5–5.1)
Potassium: 3.7 mmol/L (ref 3.5–5.1)
Sodium: 141 mmol/L (ref 135–145)
Sodium: 138 mmol/L (ref 135–145)
TOTAL PROTEIN: 6.9 g/dL (ref 6.0–8.3)
Total Bilirubin: 1.5 mg/dL — ABNORMAL HIGH (ref 0.3–1.2)
Total Bilirubin: 1.2 mg/dL (ref 0.3–1.2)
Total Protein: 6.7 g/dL (ref 6.0–8.3)

## 2014-06-17 LAB — BRAIN NATRIURETIC PEPTIDE
B NATRIURETIC PEPTIDE 5: 1985.7 pg/mL — AB (ref 0.0–100.0)
B Natriuretic Peptide: 1951.5 pg/mL — ABNORMAL HIGH (ref 0.0–100.0)

## 2014-06-17 LAB — APTT
aPTT: 32 seconds (ref 24–37)
aPTT: 128 seconds — ABNORMAL HIGH (ref 24–37)

## 2014-06-17 LAB — TSH: TSH: 2.626 u[IU]/mL (ref 0.350–4.500)

## 2014-06-17 LAB — CBG MONITORING, ED: Glucose-Capillary: 208 mg/dL — ABNORMAL HIGH (ref 70–99)

## 2014-06-17 LAB — GLUCOSE, CAPILLARY: Glucose-Capillary: 224 mg/dL — ABNORMAL HIGH (ref 70–99)

## 2014-06-17 LAB — MRSA PCR SCREENING: MRSA by PCR: POSITIVE — AB

## 2014-06-17 LAB — MAGNESIUM: MAGNESIUM: 1.5 mg/dL (ref 1.5–2.5)

## 2014-06-17 MED ORDER — MOMETASONE FURO-FORMOTEROL FUM 100-5 MCG/ACT IN AERO
2.0000 | INHALATION_SPRAY | Freq: Two times a day (BID) | RESPIRATORY_TRACT | Status: DC
  Administered 2014-06-17 – 2014-06-27 (×16): 2 via RESPIRATORY_TRACT
  Filled 2014-06-17 (×3): qty 8.8

## 2014-06-17 MED ORDER — ALBUTEROL SULFATE HFA 108 (90 BASE) MCG/ACT IN AERS: 2.0000 | INHALATION_SPRAY | RESPIRATORY_TRACT | Status: DC | PRN

## 2014-06-17 MED ORDER — GABAPENTIN 300 MG PO CAPS
300.0000 mg | ORAL_CAPSULE | Freq: Every day | ORAL | Status: DC
  Administered 2014-06-17 – 2014-06-26 (×10): 300 mg via ORAL
  Filled 2014-06-17 (×11): qty 1

## 2014-06-17 MED ORDER — SERTRALINE HCL 100 MG PO TABS
100.0000 mg | ORAL_TABLET | Freq: Every day | ORAL | Status: DC
  Administered 2014-06-18 – 2014-06-27 (×10): 100 mg via ORAL
  Filled 2014-06-17 (×10): qty 1

## 2014-06-17 MED ORDER — ASPIRIN EC 81 MG PO TBEC: 81.0000 mg | DELAYED_RELEASE_TABLET | Freq: Every day | ORAL | Status: DC

## 2014-06-17 MED ORDER — NITROGLYCERIN IN D5W 200-5 MCG/ML-% IV SOLN
0.0000 ug/min | INTRAVENOUS | Status: DC
  Administered 2014-06-17: 10 ug/min via INTRAVENOUS
  Administered 2014-06-18: 40 ug/min via INTRAVENOUS
  Administered 2014-06-18: 70 ug/min via INTRAVENOUS
  Filled 2014-06-17 (×2): qty 250

## 2014-06-17 MED ORDER — POTASSIUM CHLORIDE CRYS ER 20 MEQ PO TBCR
20.0000 meq | EXTENDED_RELEASE_TABLET | Freq: Two times a day (BID) | ORAL | Status: DC
  Administered 2014-06-17 – 2014-06-21 (×9): 20 meq via ORAL
  Filled 2014-06-17 (×11): qty 1

## 2014-06-17 MED ORDER — INSULIN ASPART PROT & ASPART (70-30 MIX) 100 UNIT/ML ~~LOC~~ SUSP
18.0000 [IU] | Freq: Every day | SUBCUTANEOUS | Status: DC
  Administered 2014-06-18 – 2014-06-27 (×7): 18 [IU] via SUBCUTANEOUS
  Filled 2014-06-17 (×3): qty 10

## 2014-06-17 MED ORDER — ONDANSETRON HCL 4 MG/2ML IJ SOLN
4.0000 mg | Freq: Four times a day (QID) | INTRAMUSCULAR | Status: DC | PRN
  Administered 2014-06-17: 4 mg via INTRAVENOUS
  Filled 2014-06-17: qty 2

## 2014-06-17 MED ORDER — POLYETHYLENE GLYCOL 3350 17 G PO PACK
17.0000 g | PACK | Freq: Every day | ORAL | Status: DC | PRN
  Filled 2014-06-17: qty 1

## 2014-06-17 MED ORDER — ALBUTEROL SULFATE (2.5 MG/3ML) 0.083% IN NEBU
5.0000 mg | INHALATION_SOLUTION | Freq: Once | RESPIRATORY_TRACT | Status: AC
  Administered 2014-06-17: 5 mg via RESPIRATORY_TRACT
  Filled 2014-06-17: qty 6

## 2014-06-17 MED ORDER — BISACODYL 5 MG PO TBEC: 5.0000 mg | DELAYED_RELEASE_TABLET | Freq: Every day | ORAL | Status: DC | PRN

## 2014-06-17 MED ORDER — ALBUTEROL SULFATE (2.5 MG/3ML) 0.083% IN NEBU
2.5000 mg | INHALATION_SOLUTION | RESPIRATORY_TRACT | Status: DC | PRN
  Administered 2014-06-24: 2.5 mg via RESPIRATORY_TRACT
  Filled 2014-06-17: qty 3

## 2014-06-17 MED ORDER — EZETIMIBE 10 MG PO TABS
10.0000 mg | ORAL_TABLET | Freq: Every morning | ORAL | Status: DC
  Administered 2014-06-18 – 2014-06-27 (×10): 10 mg via ORAL
  Filled 2014-06-17 (×10): qty 1

## 2014-06-17 MED ORDER — DOCUSATE SODIUM 100 MG PO CAPS
100.0000 mg | ORAL_CAPSULE | Freq: Two times a day (BID) | ORAL | Status: DC | PRN
  Filled 2014-06-17: qty 1

## 2014-06-17 MED ORDER — PANTOPRAZOLE SODIUM 40 MG PO TBEC
40.0000 mg | DELAYED_RELEASE_TABLET | Freq: Two times a day (BID) | ORAL | Status: DC
  Administered 2014-06-17 – 2014-06-27 (×20): 40 mg via ORAL
  Filled 2014-06-17 (×20): qty 1

## 2014-06-17 MED ORDER — ASPIRIN 325 MG PO TABS
325.0000 mg | ORAL_TABLET | Freq: Every day | ORAL | Status: DC
  Administered 2014-06-18: 325 mg via ORAL
  Filled 2014-06-17 (×3): qty 1

## 2014-06-17 MED ORDER — ASPIRIN 81 MG PO CHEW: 324.0000 mg | CHEWABLE_TABLET | ORAL | Status: AC

## 2014-06-17 MED ORDER — ACETAMINOPHEN 325 MG PO TABS: 650.0000 mg | ORAL_TABLET | ORAL | Status: DC | PRN

## 2014-06-17 MED ORDER — HYDRALAZINE HCL 50 MG PO TABS
50.0000 mg | ORAL_TABLET | Freq: Three times a day (TID) | ORAL | Status: DC
  Administered 2014-06-17 – 2014-06-18 (×3): 50 mg via ORAL
  Filled 2014-06-17 (×7): qty 1

## 2014-06-17 MED ORDER — HYDROCODONE-ACETAMINOPHEN 10-325 MG PO TABS
1.0000 | ORAL_TABLET | Freq: Four times a day (QID) | ORAL | Status: DC | PRN
Start: 2014-06-17 — End: 2014-06-27
  Administered 2014-06-20 – 2014-06-21 (×2): 1 via ORAL
  Filled 2014-06-17 (×2): qty 1

## 2014-06-17 MED ORDER — GLUCERNA SHAKE PO LIQD: 237.0000 mL | Freq: Two times a day (BID) | ORAL | Status: DC

## 2014-06-17 MED ORDER — NITROGLYCERIN 0.4 MG SL SUBL: 0.4000 mg | SUBLINGUAL_TABLET | SUBLINGUAL | Status: DC | PRN

## 2014-06-17 MED ORDER — CALCITRIOL 0.25 MCG PO CAPS
0.2500 ug | ORAL_CAPSULE | Freq: Every day | ORAL | Status: DC
  Administered 2014-06-18 – 2014-06-27 (×10): 0.25 ug via ORAL
  Filled 2014-06-17 (×10): qty 1

## 2014-06-17 MED ORDER — NITROGLYCERIN IN D5W 200-5 MCG/ML-% IV SOLN
INTRAVENOUS | Status: AC
  Filled 2014-06-17: qty 250

## 2014-06-17 MED ORDER — LABETALOL HCL 100 MG PO TABS
100.0000 mg | ORAL_TABLET | Freq: Two times a day (BID) | ORAL | Status: DC
  Administered 2014-06-17: 100 mg via ORAL
  Filled 2014-06-17 (×5): qty 1

## 2014-06-17 MED ORDER — LATANOPROST 0.005 % OP SOLN
1.0000 [drp] | Freq: Every day | OPHTHALMIC | Status: DC
Start: 2014-06-17 — End: 2014-06-27
  Administered 2014-06-17 – 2014-06-26 (×10): 1 [drp] via OPHTHALMIC
  Filled 2014-06-17 (×3): qty 2.5

## 2014-06-17 MED ORDER — DONEPEZIL HCL 5 MG PO TABS
5.0000 mg | ORAL_TABLET | Freq: Every day | ORAL | Status: DC
  Administered 2014-06-17 – 2014-06-26 (×10): 5 mg via ORAL
  Filled 2014-06-17 (×12): qty 1

## 2014-06-17 MED ORDER — INSULIN ASPART 100 UNIT/ML ~~LOC~~ SOLN
0.0000 [IU] | Freq: Three times a day (TID) | SUBCUTANEOUS | Status: DC
  Administered 2014-06-18: 5 [IU] via SUBCUTANEOUS
  Administered 2014-06-18: 2 [IU] via SUBCUTANEOUS
  Administered 2014-06-19: 5 [IU] via SUBCUTANEOUS
  Administered 2014-06-19: 3 [IU] via SUBCUTANEOUS
  Administered 2014-06-19: 5 [IU] via SUBCUTANEOUS
  Administered 2014-06-20 (×2): 3 [IU] via SUBCUTANEOUS
  Administered 2014-06-21 (×2): 5 [IU] via SUBCUTANEOUS
  Administered 2014-06-21: 11 [IU] via SUBCUTANEOUS

## 2014-06-17 MED ORDER — HEPARIN (PORCINE) IN NACL 100-0.45 UNIT/ML-% IJ SOLN
1050.0000 [IU]/h | INTRAMUSCULAR | Status: DC
  Administered 2014-06-17 (×2): 900 [IU]/h via INTRAVENOUS
  Administered 2014-06-18 – 2014-06-20 (×3): 1050 [IU]/h via INTRAVENOUS
  Filled 2014-06-17 (×6): qty 250

## 2014-06-17 MED ORDER — ACETAMINOPHEN 500 MG PO TABS: 500.0000 mg | ORAL_TABLET | Freq: Four times a day (QID) | ORAL | Status: DC | PRN

## 2014-06-17 MED ORDER — FUROSEMIDE 10 MG/ML IJ SOLN
100.0000 mg | Freq: Once | INTRAVENOUS | Status: AC
  Administered 2014-06-17: 100 mg via INTRAVENOUS
  Filled 2014-06-17: qty 10

## 2014-06-17 MED ORDER — ASPIRIN 300 MG RE SUPP: 300.0000 mg | RECTAL | Status: AC

## 2014-06-17 MED ORDER — HEPARIN BOLUS VIA INFUSION
4000.0000 [IU] | Freq: Once | INTRAVENOUS | Status: AC
  Administered 2014-06-17: 4000 [IU] via INTRAVENOUS
  Filled 2014-06-17: qty 4000

## 2014-06-17 NOTE — ED Notes (Signed)
Patient c/o 7/10 chest pain to Cardiology upon examination.

## 2014-06-17 NOTE — ED Notes (Signed)
Cardiology in to see the patient.

## 2014-06-17 NOTE — Progress Notes (Signed)
Admitted from [facility] for NSTEMI. Currently on nitro gtt and heparin gtt. CP improved on nitro gtt. SBP was >200s at [facility], after nitro gtt, it improved to 140s, however now back up to 200s. Will continue to titrate nitro gtt. Trop trending down from 2.57 to 2.40 reassuring.  Hilbert Corrigan PA Pager: [phone]

## 2014-06-17 NOTE — Progress Notes (Signed)
CRITICAL VALUE ALERT  Critical value received: Troponin 2.71  Date of notification:  06/17/2014  Time of notification:  2120  Critical value read back: yes  Nurse who received alert:  Johnsie Cancel  MD notified (1st page):  Dr. Fransico Him  Time of first page:  2145  MD notified (2nd page):  Time of second page:  Responding MD:  Dr. Fransico Him  Time MD responded:  2145

## 2014-06-17 NOTE — H&P (Signed)
Admit date: 06/17/2014 Referring Physician: Dr. Audie Pinto Primary Cardiologist: Dr. Ena Dawley Chief complaint/reason for admission:Chest pain  HPI: This is an 81yo AAF with a long history of diastolic CHF, nonobstructive CAD, DM2, HTN, HL, sleep apnea and CKD. Admitted to [facility] 02/239 with a/c diastolic CHF and with elevated enzymes that were 2/2 CHF + CKD. Admitted with CP in 05/2012. She r/o for MI. Echo 05/31/12: Severe LVH, EF 40-45%, diffuse HK, grade 1 diastolic dysfunction, mild to moderate calcified aortic valve annulus, mild to moderate aortic stenosis, MAC, mild MR, moderate LAE. Myoview was arranged for risk stratification. Myoview 06/02/12: Low anterior wall scar, no ischemia, EF 37%. It is felt that she would be high risk for contrast-induced nephropathy given her CKD. Last seen in this office 06/16/2012. Since then, she presented to the hospital in 08/2012 with an ischemic left leg. She ultimately underwent L BKA with Dr. Scot Dock.   11/15/2013 - the patient reports that her lower extremity edema has improved however her shortness of breath has persisted and she is experiencing paroxysmal nocturnal dyspnea about twice a week. She has had an episode of sinusitis with productive cough that has resolved about a week ago. She still experiencing nasal congestion.  01/13/2014 - the patient states that her lower extremity edema and shortness of breath got significantly better she still would experience chest pain and chest tightness on exertion.  04/13/14 - the patient is came in for 3 months follow-up, she was down 12 pounds.  She had her labs checked with worsening creatinine at 2.4 previously 1.4. Also her potassium was 3.0 and her  furosemide was decreased from 80 mg twice a day to 80 mg daily and metolazone was held.  She denied any chest pain or shortness of breath. She also denied orthopnea or proximal nocturnal dyspnea.  06/08/2014 - the patient came in to see Dr. Meda Coffee  complaining of worsening lower extremity edema in her right lower extremity. She was experiencing progressively worsening shortness of breath in the last 2-3 months. Her Lasix was decreased in on the end of October  because her creatinine raised from 1.4 - 2.4.  She was also experiencing orthopnea or paroxysmal nocturnal dyspnea.  The patient had been seen by her pulmonologist on just 2 days prior and her CPAP settings had  been adjusted but hadn't been changed on her machine yet. The patient also stated that for the last couple of weeks edema on her right lower extremity had been worsening. She is also was experiencing chest pain at that time when she felt dyspnea on exertion.  She was volume overloaded on exam and Lasix was increased to 80g BID.  She also was complaining of exertional CP.  Her last nuclear stress test showed EF 37% with scar but cath was not performed due to CKD.  Dr. Meda Coffee ordered a repeat nuclear stress test to assess for ischemia again but this has not been done.    Today she presents to the ER with complaint of chest pain that is 7/10 in severity with radiation to her arms and associated with SOB, nausea and diaphoresis.  She did not take anything for it.  Initially on arrival in the ER the nurse said she was no longer complaining of CP but the patient says she has had 7/10 chest pain for several hours now.      PMH:    Past Medical History  Diagnosis Date  . Uterine cancer   . Diabetes mellitus  type II; peripheral neuropathy  . Hypertension   . GERD (gastroesophageal reflux disease)   . Peripheral vascular disease     s/p L BKA 08/2012  . Obesity   . Dyslipidemia   . Diastolic CHF, chronic     EF 50-55%, mild LVH and grade 1 diast. Dysfxn  . Osteoarthritis cervical spine   . DM retinopathy   . Sleep apnea     with CPAP  . History of shingles   . COPD (chronic obstructive pulmonary disease)   . Depression   . Peptic ulcer disease     duodenal  . Peptic  stricture of esophagus   . Hiatal hernia   . Diverticulosis   . Arthritis   . ASCVD (arteriosclerotic cardiovascular disease)     on MRI brin  . CAD (coronary artery disease)     a. s/p multiple caths with nonobs CAD;   b. cath 1/10: pLAD 20%, mLAD 40%, pCFX 20%, mCFX 40%, pRCA 60-70%;   c.  Myoview 06/02/12: Low anterior wall scar, no ischemia, EF 37%  . Cardiomyopathy     a. Echo 05/31/12: Severe LVH, EF 40-45%, diffuse HK, grade 1 diastolic dysfunction, mild to moderate calcified aortic valve annulus, mild to moderate aortic stenosis, MAC, mild MR, moderate LAE  . Asthma     Mild  . Pruritic condition     Idiopathic  . Hyperlipidemia   . Zoster   . Noncompliance   . CKD (chronic kidney disease)     Dr. Lorrene Reid    PSH:    Past Surgical History  Procedure Laterality Date  . Tubal ligation  1967  . Knee arthroscopy  10/1998    Left  . Craniotomy  1997    Left for SDH  . Cataract extraction, bilateral  2005  . Hernia repair    . Esophagogastroduodenoscopy  04/04/2004  . Spine surgery      C-spine and lumbar surgery  . Cholecystectomy  2010  . Cardiac catheterization    . Dexa  7/05  . Amputation Left 09/04/2012    Procedure: AMPUTATION BELOW KNEE;  Surgeon: Angelia Mould, MD;  Location: [place];  Service: Vascular;  Laterality: Left;  . Abdominal angiogram N/A 08/31/2012    Procedure: ABDOMINAL ANGIOGRAM with runoff poss intervention;  Surgeon: Angelia Mould, MD;  Location: [facility] CATH LAB;  Service: Cardiovascular;  Laterality: N/A;  . Tubal ligation      ALLERGIES:   Iohexol  Prior to Admit Meds:   Prescriptions prior to admission  Medication Sig Dispense Refill Last Dose  . albuterol (PROVENTIL HFA;VENTOLIN HFA) 108 (90 BASE) MCG/ACT inhaler Inhale 2 puffs into the lungs every 4 (four) hours as needed for wheezing or shortness of breath. 1 Inhaler 3 Past Week at Unknown time  . aspirin 325 MG tablet Take 325 mg by mouth daily.   06/17/2014 at Unknown time  .  calcitRIOL (ROCALTROL) 0.25 MCG capsule TAKE ONE CAPSULE BY MOUTH EVERY DAY 30 capsule 0 06/17/2014 at Unknown time  . docusate sodium (COLACE) 100 MG capsule Take 100 mg by mouth 2 (two) times daily as needed for mild constipation (constipation).    Past Week at Unknown time  . donepezil (ARICEPT) 5 MG tablet Take 1 tablet (5 mg total) by mouth at bedtime. 30 tablet 11 06/16/2014 at Unknown time  . ezetimibe (ZETIA) 10 MG tablet Take 1 tablet (10 mg total) by mouth every morning. 30 tablet 11 06/17/2014 at Unknown time  . feeding  supplement (GLUCERNA SHAKE) LIQD Take 237 mLs by mouth 2 (two) times daily.   06/17/2014 at Unknown time  . Fluticasone-Salmeterol (ADVAIR DISKUS) 100-50 MCG/DOSE AEPB Inhale 1 puff into the lungs 2 (two) times daily. 1 each 11 06/17/2014 at Unknown time  . furosemide (LASIX) 80 MG tablet Take 1 tablet (80 mg total) by mouth daily. 90 tablet 3 06/17/2014 at Unknown time  . gabapentin (NEURONTIN) 300 MG capsule TAKE 1 CAPSULE BY MOUTH AT BEDTIME 30 capsule 0 06/16/2014 at Unknown time  . glucose blood (ONE TOUCH ULTRA TEST) test strip Use 2 times per day 100 each 2 06/17/2014 at Unknown time  . hydrALAZINE (APRESOLINE) 25 MG tablet TAKE 1 TABLET BY MOUTH THREE TIMES DAILY 90 tablet 0 06/17/2014 at Unknown time  . insulin NPH-regular Human (NOVOLIN 70/30) (70-30) 100 UNIT/ML injection 15 units with breakfast, and 10 units with the evening meal. (Patient taking differently: 18 units with breakfast, and 10 units with the evening meal.) 10 mL 2 06/16/2014 at Unknown time  . isosorbide mononitrate (IMDUR) 30 MG 24 hr tablet Take 1 tablet (30 mg total) by mouth daily. 30 tablet 11 06/17/2014 at Unknown time  . labetalol (NORMODYNE) 100 MG tablet TAKE 1 TABLET BY MOUTH TWICE DAILY 60 tablet 0 06/17/2014 at 0930  . latanoprost (XALATAN) 0.005 % ophthalmic solution Place 1 drop into both eyes at bedtime.   06/16/2014 at Unknown time  . nitroGLYCERIN (NITROSTAT) 0.4 MG SL tablet Place 0.4 mg under the  tongue every 5 (five) minutes as needed for chest pain.    unknown at unknown time  . pantoprazole (PROTONIX) 40 MG tablet TAKE 1 TABLET BY MOUTH TWICE DAILY 60 tablet 0 06/17/2014 at Unknown time  . potassium chloride SA (K-DUR,KLOR-CON) 20 MEQ tablet Take 20 mEq by mouth 2 (two) times daily.    06/17/2014 at Unknown time  . sertraline (ZOLOFT) 100 MG tablet TAKE 1 TABLET BY MOUTH EVERY DAY 30 tablet 0 06/17/2014 at Unknown time  . acetaminophen (TYLENOL) 500 MG tablet Take 500 mg by mouth every 6 (six) hours as needed for pain. For pain   unknown at unknown time  . albuterol (PROVENTIL) (2.5 MG/3ML) 0.083% nebulizer solution Take 3 mLs (2.5 mg total) by nebulization every 4 (four) hours as needed for wheezing or shortness of breath. 75 mL 12 unknown at unkown time  . allopurinol (ZYLOPRIM) 100 MG tablet Take 1 tablet (100 mg total) by mouth daily. (Patient not taking: Reported on 06/17/2014) 30 tablet 11 Not Taking at Unknown time  . bisacodyl (DULCOLAX) 5 MG EC tablet Take 1 tablet (5 mg total) by mouth daily as needed for moderate constipation. 30 tablet 0 unknown at unknown time  . HYDROcodone-acetaminophen (NORCO) 10-325 MG per tablet Take one tablet by mouth every 4 hours as needed for pain 180 tablet 0 unknown at unknown time  . labetalol (NORMODYNE) 100 MG tablet TAKE 1 TABLET BY MOUTH TWICE DAILY (Patient not taking: Reported on 06/17/2014) 60 tablet 0 Taking  . polyethylene glycol (MIRALAX / GLYCOLAX) packet Take 17 g by mouth daily as needed for mild constipation. 72 each 0 unknown at unknown time   Family HX:    Family History  Problem Relation Age of Onset  . Cancer - Other Mother     "Stomach" Cancer  . Diabetes Mother   . Heart disease Mother   . Stomach cancer Mother   . Hypertension Mother   . Lymphoma Father   . Hypertension Father   .  Kidney disease Paternal Grandmother   . Asthma Other   . Diabetes Sister   . Rheum arthritis Mother   . Rheum arthritis Father    Social HX:      History   Social History  . Marital Status: Married    Spouse Name: N/A    Number of Children: 7  . Years of Education: N/A   Occupational History  . Retired    Social History Main Topics  . Smoking status: Never Smoker   . Smokeless tobacco: Never Used  . Alcohol Use: No     Comment: rare  . Drug Use: No  . Sexual Activity: No   Other Topics Concern  . Not on file   Social History Narrative     ROS:  All 11 ROS were addressed and are negative except what is stated in the HPI  PHYSICAL EXAM Filed Vitals:   06/17/14 1940  BP: 142/117  Pulse: 55  Temp:   Resp: 15   General: Well developed, well nourished, in no acute distress Head: Eyes PERRLA, No xanthomas.   Normal cephalic and atramat Lungs:   Crackles at bases bilaterally Heart:   HRRR S1 S2 Pulses are 2+ & equal.            No carotid bruit. No JVD.  No abdominal bruits. No femoral bruits. Abdomen: Bowel sounds are positive, abdomen soft and non-tender without masses Extremities:   1-2+ edema in RLE Neuro: Alert and oriented X 3. Psych:  Good affect, responds appropriately   Labs:   Lab Results  Component Value Date   WBC 6.5 06/17/2014   HGB 13.2 06/17/2014   HCT 41.4 06/17/2014   MCV 89.4 06/17/2014   PLT 274 06/17/2014    Recent Labs Lab 06/17/14 1329  NA 141  K 4.6  CL 107  CO2 24  BUN 18  CREATININE 1.33*  CALCIUM 8.9  PROT 6.7  BILITOT 1.5*  ALKPHOS 102  ALT 24  AST 35  GLUCOSE 230*   Lab Results  Component Value Date   CKTOTAL 425* 05/30/2012   CKMB 6.5* 05/30/2012   TROPONINI 2.40* 06/17/2014   No results found for: PTT Lab Results  Component Value Date   INR 1.06 06/17/2014   INR 0.94 08/29/2012   INR 1.03 05/30/2012     Lab Results  Component Value Date   CHOL 139 01/08/2013   CHOL 187 11/21/2011   CHOL * 09/11/2010    240        ATP III CLASSIFICATION:  <200     mg/dL   Desirable  200-239  mg/dL   Borderline High  >=240    mg/dL   High          Lab  Results  Component Value Date   HDL 53.00 01/08/2013   HDL 61.40 11/21/2011   HDL 66 09/11/2010   Lab Results  Component Value Date   LDLCALC 51 01/08/2013   LDLCALC 111* 11/21/2011   LDLCALC * 09/11/2010    160        Total Cholesterol/HDL:CHD Risk Coronary Heart Disease Risk Table                     Men   Women  1/2 Average Risk   3.4   3.3  Average Risk       5.0   4.4  2 X Average Risk   9.6   7.1  3 X Average Risk  23.4   11.0        Use the calculated Patient Ratio above and the CHD Risk Table to determine the patient's CHD Risk.        ATP III CLASSIFICATION (LDL):  <100     mg/dL   Optimal  100-129  mg/dL   Near or Above                    Optimal  130-159  mg/dL   Borderline  160-189  mg/dL   High  >190     mg/dL   Very High   Lab Results  Component Value Date   TRIG 173.0* 01/08/2013   TRIG 73.0 11/21/2011   TRIG 72 09/11/2010   Lab Results  Component Value Date   CHOLHDL 3 01/08/2013   CHOLHDL 3 11/21/2011   CHOLHDL 3.6 09/11/2010   Lab Results  Component Value Date   LDLDIRECT 123.0 07/06/2010      Radiology:  No results found.  EKG:  NSR with diffuse ST/T wave abnormality in the inferior and anterolateral leads which is more prominent in V3 and V4 than prior EKG.  ASSESSMENT AND PLAN:  1. Chronic Combined Systolic and Diastolic CHF:  Patient has some mild volume overload on exam with increased RLE edema and venous congestion on xray.  Will change to Lasix 80mg  IV daily and check BNP  2. NSTEMI: The patient has been having exertional typical chest pain and now presents with prolonged episode of CP with elevated trop c/w NSTEMI.  She is still having 7/10 CP but no ST elevated on EKG. Scar of prior nuclear stress testing with left ventricular ejection fraction 37%, cardiac cath was never performed because of her chronic kidney disease. Continue ASA and statin. Continue IV Heparin started in ER.  Start IV NTG to try to get pain free.  Will recheck  troponin and if continuing to rise will plan cath tonight otherwise if stable and can get pain under control will hold on cath until Monday and do cors only to avoid significant contrast exposure.  This was discussed with Dr. Claiborne Billings on call for interventional Cardiology.  3. Cardiomyopathy: No ACE or ARB due to CKD. Continue beta blocker, hydralazine, nitrates.   4. CKD: Followed by nephrology. Follow closely while diuresing  5. Hypertension: uncontrolled. Continue BB/Hydralazine.  Starting on IV NTG so will titrate for elevated BP.  Cannot increase BB further due to bradycardia. Increase Hydralazine to 50mg  TID.  6. Hyperlipidemia: Continue statin.  7. PAD, s/p L BKA: F/u with [facility] as planned.   8. COPD - on Advair -  Albuterol PRN   Christina Margarita, MD  06/17/2014  8:10 PM

## 2014-06-17 NOTE — ED Notes (Signed)
[facility]  notified that patient will be going to [facility] with [facility] due to [facility] ETA of 2 hours or more.

## 2014-06-17 NOTE — Progress Notes (Signed)
ANTICOAGULATION CONSULT NOTE - Initial Consult  Pharmacy Consult for heparin Indication: chest pain/ACS  Allergies  Allergen Reactions  . Iohexol      Code: RASH, Desc: [place] ON PT'S CHART ALLERGIC TO IV DYE 09/04/07/RM, Onset Date: [id number]     Patient Measurements: Height: 5\' 6"  (167.6 cm) Weight: 218 lb (98.884 kg) IBW/kg (Calculated) : 59.3 Heparin Dosing Weight: 81.6kg  Vital Signs: Temp: 98.4 F (36.9 C) (01/01 1303) Temp Source: Rectal (01/01 1303) BP: 199/85 mmHg (01/01 1500) Pulse Rate: 61 (01/01 1500)  Labs:  Recent Labs  06/17/14 1329  CREATININE 1.33*  TROPONINI 2.57*    Estimated Creatinine Clearance: 38.7 mL/min (by C-G formula based on Cr of 1.33).   Medical History: Past Medical History  Diagnosis Date  . Uterine cancer   . Diabetes mellitus     type II; peripheral neuropathy  . Hypertension   . GERD (gastroesophageal reflux disease)   . Peripheral vascular disease     s/p L BKA 08/2012  . Obesity   . Dyslipidemia   . Diastolic CHF, chronic     EF 50-55%, mild LVH and grade 1 diast. Dysfxn  . Osteoarthritis cervical spine   . DM retinopathy   . Sleep apnea     with CPAP  . History of shingles   . COPD (chronic obstructive pulmonary disease)   . Depression   . Peptic ulcer disease     duodenal  . Peptic stricture of esophagus   . Hiatal hernia   . Diverticulosis   . Arthritis   . ASCVD (arteriosclerotic cardiovascular disease)     on MRI brin  . CAD (coronary artery disease)     a. s/p multiple caths with nonobs CAD;   b. cath 1/10: pLAD 20%, mLAD 40%, pCFX 20%, mCFX 40%, pRCA 60-70%;   c.  Myoview 06/02/12: Low anterior wall scar, no ischemia, EF 37%  . Cardiomyopathy     a. Echo 05/31/12: Severe LVH, EF 40-45%, diffuse HK, grade 1 diastolic dysfunction, mild to moderate calcified aortic valve annulus, mild to moderate aortic stenosis, MAC, mild MR, moderate LAE  . Asthma     Mild  . Pruritic condition     Idiopathic  .  Hyperlipidemia   . Zoster   . Noncompliance   . CKD (chronic kidney disease)     Dr. Lorrene Reid    Assessment: [name] presents with shortness of breath, chest and abdominal pain.  Pharmacy consulted to dose heparin for ACS/NSTEMI  Takes ASA 325mg  daily at home Hgb 13.2 , plts WNL Scr 1.33, CrCl ~68mls/min Heparin dosing weight= 81.6kg PT 13.9 INR 1.06  Goal of Therapy:  Heparin level 0.3-0.7 units/ml Monitor platelets by anticoagulation protocol   Plan:   Draw PTT, PT/INR stat  Heparin bolus 4000 Units X1  Heparin drip 900 Units/hour  Obtain heparin level in 8 hours  Daily CBC, heparin level  Dolly Rias RPh 06/17/2014, 3:48 PM Pager [phone]

## 2014-06-17 NOTE — ED Provider Notes (Signed)
CSN: [id number]     Arrival date & time 06/17/14  1234 History   First MD Initiated Contact with Patient 06/17/14 1252     Chief Complaint  Patient presents with  . Shortness of Breath  . Chest Pain  . Abdominal Pain      HPI Patient presents today with complaint fo increasing SOB and chest discomfort.  No N,V, diaphoresis.  History of CAD and DM, along with cardiomyopathy. Past Medical History  Diagnosis Date  . Uterine cancer   . Diabetes mellitus     type II; peripheral neuropathy  . Hypertension   . GERD (gastroesophageal reflux disease)   . Peripheral vascular disease     s/p L BKA 08/2012  . Obesity   . Dyslipidemia   . Diastolic CHF, chronic     EF 50-55%, mild LVH and grade 1 diast. Dysfxn  . Osteoarthritis cervical spine   . DM retinopathy   . Sleep apnea     with CPAP  . History of shingles   . COPD (chronic obstructive pulmonary disease)   . Depression   . Peptic ulcer disease     duodenal  . Peptic stricture of esophagus   . Hiatal hernia   . Diverticulosis   . Arthritis   . ASCVD (arteriosclerotic cardiovascular disease)     on MRI brin  . CAD (coronary artery disease)     a. s/p multiple caths with nonobs CAD;   b. cath 1/10: pLAD 20%, mLAD 40%, pCFX 20%, mCFX 40%, pRCA 60-70%;   c.  Myoview 06/02/12: Low anterior wall scar, no ischemia, EF 37%  . Cardiomyopathy     a. Echo 05/31/12: Severe LVH, EF 40-45%, diffuse HK, grade 1 diastolic dysfunction, mild to moderate calcified aortic valve annulus, mild to moderate aortic stenosis, MAC, mild MR, moderate LAE  . Asthma     Mild  . Pruritic condition     Idiopathic  . Hyperlipidemia   . Zoster   . Noncompliance   . CKD (chronic kidney disease)     Dr. Lorrene Reid   Past Surgical History  Procedure Laterality Date  . Tubal ligation  1967  . Knee arthroscopy  10/1998    Left  . Craniotomy  1997    Left for SDH  . Cataract extraction, bilateral  2005  . Hernia repair    . Esophagogastroduodenoscopy   04/04/2004  . Spine surgery      C-spine and lumbar surgery  . Cholecystectomy  2010  . Cardiac catheterization    . Dexa  7/05  . Amputation Left 09/04/2012    Procedure: AMPUTATION BELOW KNEE;  Surgeon: Angelia Mould, MD;  Location: [facility];  Service: Vascular;  Laterality: Left;  . Abdominal angiogram N/A 08/31/2012    Procedure: ABDOMINAL ANGIOGRAM with runoff poss intervention;  Surgeon: Angelia Mould, MD;  Location: [facility] CATH LAB;  Service: Cardiovascular;  Laterality: N/A;  . Tubal ligation    . Left heart catheterization with coronary angiogram N/A 06/22/2014    Procedure: LEFT HEART CATHETERIZATION WITH CORONARY ANGIOGRAM;  Surgeon: Burnell Blanks, MD;  Location: [facility] CATH LAB;  Service: Cardiovascular;  Laterality: N/A;   Family History  Problem Relation Age of Onset  . Cancer - Other Mother     "Stomach" Cancer  . Diabetes Mother   . Heart disease Mother   . Stomach cancer Mother   . Hypertension Mother   . Lymphoma Father   . Hypertension Father   .  Kidney disease Paternal Grandmother   . Asthma Other   . Diabetes Sister   . Rheum arthritis Mother   . Rheum arthritis Father    History  Substance Use Topics  . Smoking status: Never Smoker   . Smokeless tobacco: Never Used  . Alcohol Use: No     Comment: rare   OB History    No data available     Review of Systems  Constitutional: Positive for fatigue. Negative for fever, chills and diaphoresis.  Respiratory: Positive for shortness of breath.   Cardiovascular: Positive for chest pain and leg swelling.  Gastrointestinal: Negative for abdominal pain.  Genitourinary: Negative for difficulty urinating.  All other systems reviewed and are negative.     Allergies  Iohexol  Home Medications   Prior to Admission medications   Medication Sig Start Date End Date Taking? Authorizing Provider  albuterol (PROVENTIL HFA;VENTOLIN HFA) 108 (90 BASE) MCG/ACT inhaler Inhale 2 puffs into the lungs every  4 (four) hours as needed for wheezing or shortness of breath. 08/05/13  Yes Dorothy Spark, MD  aspirin 325 MG tablet Take 325 mg by mouth daily.   Yes Historical Provider, MD  calcitRIOL (ROCALTROL) 0.25 MCG capsule TAKE ONE CAPSULE BY MOUTH EVERY DAY 04/29/14  Yes Renato Shin, MD  docusate sodium (COLACE) 100 MG capsule Take 100 mg by mouth 2 (two) times daily as needed for mild constipation (constipation).    Yes Historical Provider, MD  donepezil (ARICEPT) 5 MG tablet Take 1 tablet (5 mg total) by mouth at bedtime. 05/19/14  Yes Renato Shin, MD  ezetimibe (ZETIA) 10 MG tablet Take 1 tablet (10 mg total) by mouth every morning. 02/17/14  Yes Renato Shin, MD  feeding supplement (GLUCERNA SHAKE) LIQD Take 237 mLs by mouth 2 (two) times daily.   Yes Historical Provider, MD  Fluticasone-Salmeterol (ADVAIR DISKUS) 100-50 MCG/DOSE AEPB Inhale 1 puff into the lungs 2 (two) times daily. 07/12/13  Yes Renato Shin, MD  furosemide (LASIX) 80 MG tablet Take 1 tablet (80 mg total) by mouth daily. 04/08/14  Yes Dorothy Spark, MD  gabapentin (NEURONTIN) 300 MG capsule TAKE 1 CAPSULE BY MOUTH AT BEDTIME 05/27/14  Yes Renato Shin, MD  glucose blood (ONE TOUCH ULTRA TEST) test strip Use 2 times per day 02/25/14  Yes Renato Shin, MD  hydrALAZINE (APRESOLINE) 25 MG tablet TAKE 1 TABLET BY MOUTH THREE TIMES DAILY 06/13/14  Yes Renato Shin, MD  insulin NPH-regular Human (NOVOLIN 70/30) (70-30) 100 UNIT/ML injection 15 units with breakfast, and 10 units with the evening meal. Patient taking differently: 18 units with breakfast, and 10 units with the evening meal. 02/25/14  Yes Renato Shin, MD  isosorbide mononitrate (IMDUR) 30 MG 24 hr tablet Take 1 tablet (30 mg total) by mouth daily. 02/17/14  Yes Renato Shin, MD  labetalol (NORMODYNE) 100 MG tablet TAKE 1 TABLET BY MOUTH TWICE DAILY 02/25/14  Yes Renato Shin, MD  latanoprost (XALATAN) 0.005 % ophthalmic solution Place 1 drop into both eyes at bedtime.   Yes  Historical Provider, MD  nitroGLYCERIN (NITROSTAT) 0.4 MG SL tablet Place 0.4 mg under the tongue every 5 (five) minutes as needed for chest pain.    Yes Historical Provider, MD  pantoprazole (PROTONIX) 40 MG tablet TAKE 1 TABLET BY MOUTH TWICE DAILY 06/13/14  Yes Renato Shin, MD  potassium chloride SA (K-DUR,KLOR-CON) 20 MEQ tablet Take 20 mEq by mouth 2 (two) times daily.  02/17/14  Yes Renato Shin, MD  sertraline (  ZOLOFT) 100 MG tablet TAKE 1 TABLET BY MOUTH EVERY DAY 05/27/14  Yes Renato Shin, MD  acetaminophen (TYLENOL) 500 MG tablet Take 500 mg by mouth every 6 (six) hours as needed for pain. For pain    Historical Provider, MD  albuterol (PROVENTIL) (2.5 MG/3ML) 0.083% nebulizer solution Take 3 mLs (2.5 mg total) by nebulization every 4 (four) hours as needed for wheezing or shortness of breath. 12/08/13   Robbie Lis, MD  allopurinol (ZYLOPRIM) 100 MG tablet Take 1 tablet (100 mg total) by mouth daily. Patient not taking: Reported on 06/17/2014 02/17/14   Renato Shin, MD  bisacodyl (DULCOLAX) 5 MG EC tablet Take 1 tablet (5 mg total) by mouth daily as needed for moderate constipation. 12/08/13   Robbie Lis, MD  HYDROcodone-acetaminophen [facility]) 10-325 MG per tablet Take one tablet by mouth every 4 hours as needed for pain 01/04/14   Blanchie Serve, MD  polyethylene glycol (MIRALAX / GLYCOLAX) packet Take 17 g by mouth daily as needed for mild constipation. 12/08/13   Robbie Lis, MD   BP 160/50 mmHg  Pulse 47  Temp(Src) 97.9 F (36.6 C) (Oral)  Resp 18  Ht 5\' 6"  (1.676 m)  Wt 207 lb (93.895 kg)  BMI 33.43 kg/m2  SpO2 99% Physical Exam  Constitutional: She is oriented to person, place, and time. She appears well-developed and well-nourished. No distress.  HENT:  Head: Normocephalic and atraumatic.  Eyes: Pupils are equal, round, and reactive to light.  Neck: Normal range of motion.  Cardiovascular: Normal rate and intact distal pulses.   Pulmonary/Chest: No respiratory distress.  She has rales.  Abdominal: Normal appearance. She exhibits no distension.  Musculoskeletal: Normal range of motion.  Neurological: She is alert and oriented to person, place, and time. No cranial nerve deficit.  Skin: Skin is warm and dry. No rash noted.  Psychiatric: She has a normal mood and affect. Her behavior is normal.  Nursing note and vitals reviewed.   ED Course  Procedures (including critical care time)   CRITICAL CARE Performed by: Leonard Schwartz L Total critical care time: 30 min Critical care time was exclusive of separately billable procedures and treating other patients. Critical care was necessary to treat or prevent imminent or life-threatening deterioration. Critical care was time spent personally by me on the following activities: development of treatment plan with patient and/or surrogate as well as nursing, discussions with consultants, evaluation of patient's response to treatment, examination of patient, obtaining history from patient or surrogate, ordering and performing treatments and interventions, ordering and review of laboratory studies, ordering and review of radiographic studies, pulse oximetry and re-evaluation of patient's condition.  Labs Review Labs Reviewed  MRSA PCR SCREENING - Abnormal; Notable for the following:    MRSA by PCR POSITIVE (*)    All other components within normal limits  CBC WITH DIFFERENTIAL - Abnormal; Notable for the following:    RDW 16.6 (*)    All other components within normal limits  COMPREHENSIVE METABOLIC PANEL - Abnormal; Notable for the following:    Glucose, Bld 230 (*)    Creatinine, Ser 1.33 (*)    Total Bilirubin 1.5 (*)    GFR calc non Af Amer 36 (*)    GFR calc Af Amer 42 (*)    All other components within normal limits  BRAIN NATRIURETIC PEPTIDE - Abnormal; Notable for the following:    B Natriuretic Peptide 1951.5 (*)    All other components within normal limits  TROPONIN I - Abnormal; Notable for the  following:    Troponin I 2.57 (*)    All other components within normal limits  TROPONIN I - Abnormal; Notable for the following:    Troponin I 2.40 (*)    All other components within normal limits  COMPREHENSIVE METABOLIC PANEL - Abnormal; Notable for the following:    Glucose, Bld 200 (*)    Creatinine, Ser 1.39 (*)    GFR calc non Af Amer 34 (*)    GFR calc Af Amer 40 (*)    All other components within normal limits  TROPONIN I - Abnormal; Notable for the following:    Troponin I 2.71 (*)    All other components within normal limits  TROPONIN I - Abnormal; Notable for the following:    Troponin I 3.21 (*)    All other components within normal limits  TROPONIN I - Abnormal; Notable for the following:    Troponin I 3.08 (*)    All other components within normal limits  HEMOGLOBIN A1C - Abnormal; Notable for the following:    Hgb A1c MFr Bld 6.7 (*)    Mean Plasma Glucose 146 (*)    All other components within normal limits  BRAIN NATRIURETIC PEPTIDE - Abnormal; Notable for the following:    B Natriuretic Peptide 1985.7 (*)    All other components within normal limits  PROTIME-INR - Abnormal; Notable for the following:    Prothrombin Time 15.3 (*)    All other components within normal limits  CBC WITH DIFFERENTIAL - Abnormal; Notable for the following:    RDW 16.1 (*)    All other components within normal limits  APTT - Abnormal; Notable for the following:    aPTT 128 (*)    All other components within normal limits  BASIC METABOLIC PANEL - Abnormal; Notable for the following:    Glucose, Bld 248 (*)    Creatinine, Ser 2.10 (*)    GFR calc non Af Amer 21 (*)    GFR calc Af Amer 24 (*)    All other components within normal limits  CBC - Abnormal; Notable for the following:    Hemoglobin 11.8 (*)    RDW 16.3 (*)    All other components within normal limits  HEPARIN LEVEL (UNFRACTIONATED) - Abnormal; Notable for the following:    Heparin Unfractionated 0.23 (*)    All  other components within normal limits  GLUCOSE, CAPILLARY - Abnormal; Notable for the following:    Glucose-Capillary 224 (*)    All other components within normal limits  GLUCOSE, CAPILLARY - Abnormal; Notable for the following:    Glucose-Capillary 208 (*)    All other components within normal limits  GLUCOSE, CAPILLARY - Abnormal; Notable for the following:    Glucose-Capillary 128 (*)    All other components within normal limits  GLUCOSE, CAPILLARY - Abnormal; Notable for the following:    Glucose-Capillary 108 (*)    All other components within normal limits  GLUCOSE, CAPILLARY - Abnormal; Notable for the following:    Glucose-Capillary 142 (*)    All other components within normal limits  CBC - Abnormal; Notable for the following:    RDW 16.5 (*)    All other components within normal limits  GLUCOSE, CAPILLARY - Abnormal; Notable for the following:    Glucose-Capillary 211 (*)    All other components within normal limits  BASIC METABOLIC PANEL - Abnormal; Notable for the following:    Glucose,  Bld 230 (*)    BUN 32 (*)    Creatinine, Ser 2.58 (*)    GFR calc non Af Amer 16 (*)    GFR calc Af Amer 19 (*)    All other components within normal limits  GLUCOSE, CAPILLARY - Abnormal; Notable for the following:    Glucose-Capillary 185 (*)    All other components within normal limits  GLUCOSE, CAPILLARY - Abnormal; Notable for the following:    Glucose-Capillary 246 (*)    All other components within normal limits  HEPARIN LEVEL (UNFRACTIONATED) - Abnormal; Notable for the following:    Heparin Unfractionated 0.25 (*)    All other components within normal limits  CBC - Abnormal; Notable for the following:    RDW 16.5 (*)    All other components within normal limits  BASIC METABOLIC PANEL - Abnormal; Notable for the following:    Glucose, Bld 204 (*)    BUN 35 (*)    Creatinine, Ser 2.07 (*)    GFR calc non Af Amer 21 (*)    GFR calc Af Amer 25 (*)    All other  components within normal limits  GLUCOSE, CAPILLARY - Abnormal; Notable for the following:    Glucose-Capillary 234 (*)    All other components within normal limits  GLUCOSE, CAPILLARY - Abnormal; Notable for the following:    Glucose-Capillary 186 (*)    All other components within normal limits  GLUCOSE, CAPILLARY - Abnormal; Notable for the following:    Glucose-Capillary 185 (*)    All other components within normal limits  GLUCOSE, CAPILLARY - Abnormal; Notable for the following:    Glucose-Capillary 122 (*)    All other components within normal limits  GLUCOSE, CAPILLARY - Abnormal; Notable for the following:    Glucose-Capillary 187 (*)    All other components within normal limits  HEPARIN LEVEL (UNFRACTIONATED) - Abnormal; Notable for the following:    Heparin Unfractionated 0.29 (*)    All other components within normal limits  CBC - Abnormal; Notable for the following:    RDW 16.4 (*)    All other components within normal limits  BASIC METABOLIC PANEL - Abnormal; Notable for the following:    Glucose, Bld 304 (*)    BUN 31 (*)    Creatinine, Ser 1.69 (*)    GFR calc non Af Amer 27 (*)    GFR calc Af Amer 31 (*)    All other components within normal limits  GLUCOSE, CAPILLARY - Abnormal; Notable for the following:    Glucose-Capillary 336 (*)    All other components within normal limits  GLUCOSE, CAPILLARY - Abnormal; Notable for the following:    Glucose-Capillary 239 (*)    All other components within normal limits  GLUCOSE, CAPILLARY - Abnormal; Notable for the following:    Glucose-Capillary 337 (*)    All other components within normal limits  CBC - Abnormal; Notable for the following:    RDW 16.6 (*)    All other components within normal limits  GLUCOSE, CAPILLARY - Abnormal; Notable for the following:    Glucose-Capillary 217 (*)    All other components within normal limits  GLUCOSE, CAPILLARY - Abnormal; Notable for the following:    Glucose-Capillary 238  (*)    All other components within normal limits  BASIC METABOLIC PANEL - Abnormal; Notable for the following:    Potassium 6.6 (*)    Glucose, Bld 373 (*)    BUN 34 (*)  Creatinine, Ser 1.77 (*)    GFR calc non Af Amer 26 (*)    GFR calc Af Amer 30 (*)    All other components within normal limits  BASIC METABOLIC PANEL - Abnormal; Notable for the following:    Potassium 6.6 (*)    Glucose, Bld 398 (*)    BUN 34 (*)    Creatinine, Ser 1.73 (*)    GFR calc non Af Amer 26 (*)    GFR calc Af Amer 30 (*)    All other components within normal limits  GLUCOSE, CAPILLARY - Abnormal; Notable for the following:    Glucose-Capillary 363 (*)    All other components within normal limits  GLUCOSE, CAPILLARY - Abnormal; Notable for the following:    Glucose-Capillary 463 (*)    All other components within normal limits  GLUCOSE, CAPILLARY - Abnormal; Notable for the following:    Glucose-Capillary 402 (*)    All other components within normal limits  BASIC METABOLIC PANEL - Abnormal; Notable for the following:    Glucose, Bld 392 (*)    BUN 36 (*)    Creatinine, Ser 1.93 (*)    GFR calc non Af Amer 23 (*)    GFR calc Af Amer 27 (*)    All other components within normal limits  CBC - Abnormal; Notable for the following:    Hemoglobin 11.6 (*)    RDW 16.2 (*)    All other components within normal limits  GLUCOSE, CAPILLARY - Abnormal; Notable for the following:    Glucose-Capillary 219 (*)    All other components within normal limits  BASIC METABOLIC PANEL - Abnormal; Notable for the following:    Glucose, Bld 476 (*)    BUN 42 (*)    Creatinine, Ser 1.89 (*)    GFR calc non Af Amer 24 (*)    GFR calc Af Amer 27 (*)    All other components within normal limits  GLUCOSE, CAPILLARY - Abnormal; Notable for the following:    Glucose-Capillary 336 (*)    All other components within normal limits  GLUCOSE, CAPILLARY - Abnormal; Notable for the following:    Glucose-Capillary 434 (*)     All other components within normal limits  GLUCOSE, CAPILLARY - Abnormal; Notable for the following:    Glucose-Capillary 376 (*)    All other components within normal limits  BASIC METABOLIC PANEL - Abnormal; Notable for the following:    Glucose, Bld 188 (*)    BUN 58 (*)    Creatinine, Ser 2.13 (*)    GFR calc non Af Amer 20 (*)    GFR calc Af Amer 24 (*)    All other components within normal limits  CBC - Abnormal; Notable for the following:    RDW 16.3 (*)    All other components within normal limits  GLUCOSE, CAPILLARY - Abnormal; Notable for the following:    Glucose-Capillary 239 (*)    All other components within normal limits  GLUCOSE, CAPILLARY - Abnormal; Notable for the following:    Glucose-Capillary 163 (*)    All other components within normal limits  GLUCOSE, CAPILLARY - Abnormal; Notable for the following:    Glucose-Capillary 154 (*)    All other components within normal limits  GLUCOSE, CAPILLARY - Abnormal; Notable for the following:    Glucose-Capillary 191 (*)    All other components within normal limits  GLUCOSE, CAPILLARY - Abnormal; Notable for the following:    Glucose-Capillary  151 (*)    All other components within normal limits  GLUCOSE, CAPILLARY - Abnormal; Notable for the following:    Glucose-Capillary 128 (*)    All other components within normal limits  BASIC METABOLIC PANEL - Abnormal; Notable for the following:    Glucose, Bld 139 (*)    BUN 59 (*)    Creatinine, Ser 2.01 (*)    GFR calc non Af Amer 22 (*)    GFR calc Af Amer 25 (*)    All other components within normal limits  GLUCOSE, CAPILLARY - Abnormal; Notable for the following:    Glucose-Capillary 136 (*)    All other components within normal limits  GLUCOSE, CAPILLARY - Abnormal; Notable for the following:    Glucose-Capillary 165 (*)    All other components within normal limits  BASIC METABOLIC PANEL - Abnormal; Notable for the following:    Glucose, Bld 154 (*)    BUN  61 (*)    Creatinine, Ser 2.11 (*)    GFR calc non Af Amer 21 (*)    GFR calc Af Amer 24 (*)    All other components within normal limits  GLUCOSE, CAPILLARY - Abnormal; Notable for the following:    Glucose-Capillary 101 (*)    All other components within normal limits  GLUCOSE, CAPILLARY - Abnormal; Notable for the following:    Glucose-Capillary 153 (*)    All other components within normal limits  CBG MONITORING, ED - Abnormal; Notable for the following:    Glucose-Capillary 208 (*)    All other components within normal limits  PROTIME-INR  APTT  MAGNESIUM  TSH  PROTIME-INR  HEPARIN LEVEL (UNFRACTIONATED)  HEPARIN LEVEL (UNFRACTIONATED)  HEPARIN LEVEL (UNFRACTIONATED)  HEPARIN LEVEL (UNFRACTIONATED)  GLUCOSE, CAPILLARY  GLUCOSE, CAPILLARY  GLUCOSE, CAPILLARY    Imaging Review  DG Chest 2 View (Final result) Result time: 06/17/14 14:46:51   Final result by Rad Results In Interface (06/17/14 14:46:51)   Narrative:   CLINICAL DATA: Shortness of breath and fever for 1 week.  EXAM: CHEST 2 VIEW  COMPARISON: 09/06/2012  FINDINGS: The heart is enlarged but stable. There is tortuosity and calcification the thoracic aorta. There is moderate vascular congestion without overt pulmonary edema. Chronic bronchitic changes but no definite infiltrates or pleural effusion. Bony thorax is intact.  IMPRESSION: Cardiac enlargement and vascular congestion without overt pulmonary edema.  Chronic bronchitic changes without definite infiltrates.     Date: 06/17/2014  Rate: 52  Rhythm: normal sinus rhythm  QRS Axis: Left axis deviation  Intervals: normal  ST/T Wave abnormalities: Abnormal T diffusely.  Conduction Disutrbances: Prolonged QT interval  Narrative Interpretation: Abnormal EKG      MDM   Final diagnoses:  Shortness of breath  NSTEMI (non-ST elevated myocardial infarction)        Dot Lanes, MD 06/26/14 1134

## 2014-06-18 DIAGNOSIS — N179 Acute kidney failure, unspecified: Secondary | ICD-10-CM

## 2014-06-18 DIAGNOSIS — N183 Chronic kidney disease, stage 3 (moderate): Secondary | ICD-10-CM

## 2014-06-18 LAB — GLUCOSE, CAPILLARY
GLUCOSE-CAPILLARY: 142 mg/dL — AB (ref 70–99)
Glucose-Capillary: 208 mg/dL — ABNORMAL HIGH (ref 70–99)
Glucose-Capillary: 128 mg/dL — ABNORMAL HIGH (ref 70–99)
Glucose-Capillary: 108 mg/dL — ABNORMAL HIGH (ref 70–99)

## 2014-06-18 LAB — BASIC METABOLIC PANEL
Anion gap: 12 (ref 5–15)
BUN: 21 mg/dL (ref 6–23)
CO2: 25 mmol/L (ref 19–32)
Calcium: 8.6 mg/dL (ref 8.4–10.5)
Chloride: 101 mEq/L (ref 96–112)
Creatinine, Ser: 2.1 mg/dL — ABNORMAL HIGH (ref 0.50–1.10)
GFR calc Af Amer: 24 mL/min — ABNORMAL LOW (ref >90)
GFR calc non Af Amer: 21 mL/min — ABNORMAL LOW (ref >90)
Glucose, Bld: 248 mg/dL — ABNORMAL HIGH (ref 70–99)
Potassium: 4 mmol/L (ref 3.5–5.1)
Sodium: 138 mmol/L (ref 135–145)

## 2014-06-18 LAB — CBC
HEMATOCRIT: 37.1 % (ref 36.0–46.0)
Hemoglobin: 11.8 g/dL — ABNORMAL LOW (ref 12.0–15.0)
MCH: 28.3 pg (ref 26.0–34.0)
MCHC: 31.8 g/dL (ref 30.0–36.0)
MCV: 89 fL (ref 78.0–100.0)
PLATELETS: 260 10*3/uL (ref 150–400)
RBC: 4.17 MIL/uL (ref 3.87–5.11)
RDW: 16.3 % — ABNORMAL HIGH (ref 11.5–15.5)
WBC: 7.3 10*3/uL (ref 4.0–10.5)

## 2014-06-18 LAB — HEPARIN LEVEL (UNFRACTIONATED)
Heparin Unfractionated: 0.23 IU/mL — ABNORMAL LOW (ref 0.30–0.70)
Heparin Unfractionated: 0.41 IU/mL (ref 0.30–0.70)
Heparin Unfractionated: 0.38 IU/mL (ref 0.30–0.70)

## 2014-06-18 LAB — PROTIME-INR
INR: 1.18 (ref 0.00–1.49)
Prothrombin Time: 15.2 seconds (ref 11.6–15.2)

## 2014-06-18 LAB — TROPONIN I
Troponin I: 3.21 ng/mL (ref <0.031)
Troponin I: 3.08 ng/mL (ref <0.031)

## 2014-06-18 MED ORDER — HEPARIN BOLUS VIA INFUSION
1000.0000 [IU] | Freq: Once | INTRAVENOUS | Status: AC
  Administered 2014-06-18: 1000 [IU] via INTRAVENOUS
  Filled 2014-06-18: qty 1000

## 2014-06-18 MED ORDER — CHLORHEXIDINE GLUCONATE CLOTH 2 % EX PADS
6.0000 | MEDICATED_PAD | Freq: Every day | CUTANEOUS | Status: AC
  Administered 2014-06-18 – 2014-06-22 (×5): 6 via TOPICAL

## 2014-06-18 MED ORDER — GLUCERNA SHAKE PO LIQD
237.0000 mL | Freq: Three times a day (TID) | ORAL | Status: DC
  Administered 2014-06-18 – 2014-06-27 (×20): 237 mL via ORAL
  Filled 2014-06-18: qty 237

## 2014-06-18 MED ORDER — FUROSEMIDE 10 MG/ML IJ SOLN
80.0000 mg | Freq: Two times a day (BID) | INTRAMUSCULAR | Status: AC
  Administered 2014-06-18 – 2014-06-19 (×3): 80 mg via INTRAVENOUS
  Filled 2014-06-18 (×3): qty 8

## 2014-06-18 MED ORDER — MUPIROCIN 2 % EX OINT
1.0000 "application " | TOPICAL_OINTMENT | Freq: Two times a day (BID) | CUTANEOUS | Status: AC
  Administered 2014-06-18 – 2014-06-22 (×10): 1 via NASAL
  Filled 2014-06-18 (×2): qty 22

## 2014-06-18 NOTE — Progress Notes (Signed)
Patient Name: [name]      SUBJECTIVE: 81 yo admitted with chest pain and shortness of breath with now + TN 2.7 >>3.1  (Last previous 1/14 <0.3)  Plan was to cath later if stable     Has hx of non obstructive CAD ( last cath 2007 --CP and + Tn)  Myoview 06/02/12: Low anterior wall scar, no ischemia, EF 37%. It is felt that she would be high risk for contrast-induced nephropathy given her CKD Echo 2013  > severe LVH and LVEF 40-45%  Has hx of HFpEF and COPD  with recent problems with worseing edema. PND and orthopnea.  Diuretic management has been complicated by Cr 2.4 in 10/15 at which her diuretics were markedly reduced  Most recent Cr 1.4 but overnight >>2.1  Lasix started yday and stopped  Still w vague chest pain, similar to chronic.  Dyspnea better but still not at baseline     Past Medical History  Diagnosis Date  . Uterine cancer   . Diabetes mellitus     type II; peripheral neuropathy  . Hypertension   . GERD (gastroesophageal reflux disease)   . Peripheral vascular disease     s/p L BKA 08/2012  . Obesity   . Dyslipidemia   . Diastolic CHF, chronic     EF 50-55%, mild LVH and grade 1 diast. Dysfxn  . Osteoarthritis cervical spine   . DM retinopathy   . Sleep apnea     with CPAP  . History of shingles   . COPD (chronic obstructive pulmonary disease)   . Depression   . Peptic ulcer disease     duodenal  . Peptic stricture of esophagus   . Hiatal hernia   . Diverticulosis   . Arthritis   . ASCVD (arteriosclerotic cardiovascular disease)     on MRI brin  . CAD (coronary artery disease)     a. s/p multiple caths with nonobs CAD;   b. cath 1/10: pLAD 20%, mLAD 40%, pCFX 20%, mCFX 40%, pRCA 60-70%;   c.  Myoview 06/02/12: Low anterior wall scar, no ischemia, EF 37%  . Cardiomyopathy     a. Echo 05/31/12: Severe LVH, EF 40-45%, diffuse HK, grade 1 diastolic dysfunction, mild to moderate calcified aortic valve annulus, mild to moderate  aortic stenosis, MAC, mild MR, moderate LAE  . Asthma     Mild  . Pruritic condition     Idiopathic  . Hyperlipidemia   . Zoster   . Noncompliance   . CKD (chronic kidney disease)     Dr. Lorrene Reid    Scheduled Meds:  Scheduled Meds: . aspirin  325 mg Oral Daily  . calcitRIOL  0.25 mcg Oral Daily  . Chlorhexidine Gluconate Cloth  6 each Topical Q0600  . donepezil  5 mg Oral QHS  . ezetimibe  10 mg Oral q morning - 10a  . feeding supplement (GLUCERNA SHAKE)  237 mL Oral BID  . gabapentin  300 mg Oral QHS  . hydrALAZINE  50 mg Oral TID  . insulin aspart  0-15 Units Subcutaneous TID WC  . insulin aspart protamine- aspart  18 Units Subcutaneous Q breakfast  . labetalol  100 mg Oral BID  . latanoprost  1 drop Both Eyes QHS  . mometasone-formoterol  2 puff Inhalation BID  . mupirocin ointment  1 application Nasal BID  . pantoprazole  40 mg Oral BID  . potassium chloride SA  20 mEq Oral  BID  . sertraline  100 mg Oral Daily   Continuous Infusions: . heparin 1,050 Units/hr (06/18/14 0907)  . nitroGLYCERIN 70 mcg/min (06/18/14 0900)   acetaminophen, albuterol, bisacodyl, docusate sodium, HYDROcodone-acetaminophen, nitroGLYCERIN, ondansetron (ZOFRAN) IV, polyethylene glycol    PHYSICAL EXAM Filed Vitals:   06/18/14 0730 06/18/14 0732 06/18/14 0800 06/18/14 0900  BP: 119/43 119/43 135/73 153/51  Pulse: 47 47 48 56  Temp:  98.2 F (36.8 C)    TempSrc:  Oral    Resp: 19 17 16 24   Height:      Weight:      SpO2: 100% 99% 100% 99%    General appearance: alert, cooperative and mild distress Neck: JVD - 8 cm above sternal notch and supple, symmetrical, trachea midline Lungs: clear to auscultation bilaterally Heart: regular rate and rhythm, S1, S2 normal, no murmur, click, rub or gallop Abdomen: soft, non-tender; bowel sounds normal; no masses,  no organomegaly Extremities: edema 2 Skin: Skin color, texture, turgor normal. No rashes or lesions Neurologic: Grossly  normal  TELEMETRY: Reviewed telemetry pt in nsr:    Intake/Output Summary (Last 24 hours) at 06/18/14 0913 Last data filed at 06/18/14 0907  Gross per 24 hour  Intake 1174.93 ml  Output      0 ml  Net 1174.93 ml    LABS: Basic Metabolic Panel:  Recent Labs Lab 06/17/14 1329 06/17/14 1946 06/18/14 0625  NA 141 138 138  K 4.6 3.7 4.0  CL 107 102 101  CO2 24 27 25   GLUCOSE 230* 200* 248*  BUN 18 14 21   CREATININE 1.33* 1.39* 2.10*  CALCIUM 8.9 9.4 8.6  MG  --  1.5  --    Cardiac Enzymes:  Recent Labs  06/17/14 1946 06/18/14 0038 06/18/14 0625  TROPONINI 2.71* 3.21* 3.08*   CBC:  Recent Labs Lab 06/17/14 1329 06/17/14 1946 06/18/14 0625  WBC 6.5 7.6 7.3  NEUTROABS 4.9 5.4  --   HGB 13.2 13.5 11.8*  HCT 41.4 41.5 37.1  MCV 89.4 87.9 89.0  PLT 274 270 260   PROTIME:  Recent Labs  06/17/14 1538 06/17/14 1946 06/18/14 0625  LABPROT 13.9 15.3* 15.2  INR 1.06 1.19 1.18   Liver Function Tests:  Recent Labs  06/17/14 1329 06/17/14 1946  AST 35 34  ALT 24 22  ALKPHOS 102 103  BILITOT 1.5* 1.2  PROT 6.7 6.9  ALBUMIN 3.7 3.8   No results for input(s): LIPASE, AMYLASE in the last 72 hours. BNP: BNP (last 3 results)  Recent Labs  11/15/13 1205 02/17/14 1202 06/08/14 1617  PROBNP 158.0* 768.0* 1249.0*   D-Dimer: No results for input(s): DDIMER in the last 72 hours. Hemoglobin A1C: No results for input(s): HGBA1C in the last 72 hours. Fasting Lipid Panel: No results for input(s): CHOL, HDL, LDLCALC, TRIG, CHOLHDL, LDLDIRECT in the last 72 hours. Thyroid Function Tests:  Recent Labs  06/17/14 1946  TSH 2.626   Anemia Panel: No results for input(s): VITAMINB12, FOLATE, FERRITIN, TIBC, IRON, RETICCTPCT in the last 72 hours.     ASSESSMENT AND PLAN:  Principal Problem:   NSTEMI (non-ST elevated myocardial infarction) Active Problems:   DM (diabetes mellitus), type 2, uncontrolled, with renal complications   Hyperlipidemia    Essential hypertension   Coronary atherosclerosis of artery bypass graft   Chronic combined systolic and diastolic heart failure   Acute renal failure superimposed on stage 3 chronic kidney disease NSTEMI  Tn seems to have reached apogee Will continue IV heparin  Undertake low dose diuresis Follow Cr closely with question related to riks of CATH given this Anticipate myoview Monday for risk stratification   Signed, Virl Axe MD  06/18/2014

## 2014-06-18 NOTE — Progress Notes (Signed)
ANTICOAGULATION CONSULT NOTE - Follow Up Consult  Pharmacy Consult for heparin Indication: chest pain/ACS  Allergies  Allergen Reactions  . Iohexol      Code: RASH, Desc: [place] ON PT'S CHART ALLERGIC TO IV DYE 09/04/07/RM, Onset Date: [id number]     Patient Measurements: Height: 5\' 6"  (167.6 cm) Weight: 218 lb (98.884 kg) IBW/kg (Calculated) : 59.3 Heparin Dosing Weight: 81.6kg  Vital Signs: Temp: 98.7 F (37.1 C) (01/02 0000) Temp Source: Oral (01/02 0000) BP: 153/66 mmHg (01/02 0115) Pulse Rate: 53 (01/02 0115)  Labs:  Recent Labs  06/17/14 1329 06/17/14 1538 06/17/14 1754 06/17/14 1946 06/18/14 0038  HGB 13.2  --   --  13.5  --   HCT 41.4  --   --  41.5  --   PLT 274  --   --  270  --   APTT  --  32  --  128*  --   LABPROT  --  13.9  --  15.3*  --   INR  --  1.06  --  1.19  --   HEPARINUNFRC  --   --   --   --  0.23*  CREATININE 1.33*  --   --  1.39*  --   TROPONINI 2.57*  --  2.40* 2.71*  --     Estimated Creatinine Clearance: 37 mL/min (by C-G formula based on Cr of 1.39).   Medical History: Past Medical History  Diagnosis Date  . Uterine cancer   . Diabetes mellitus     type II; peripheral neuropathy  . Hypertension   . GERD (gastroesophageal reflux disease)   . Peripheral vascular disease     s/p L BKA 08/2012  . Obesity   . Dyslipidemia   . Diastolic CHF, chronic     EF 50-55%, mild LVH and grade 1 diast. Dysfxn  . Osteoarthritis cervical spine   . DM retinopathy   . Sleep apnea     with CPAP  . History of shingles   . COPD (chronic obstructive pulmonary disease)   . Depression   . Peptic ulcer disease     duodenal  . Peptic stricture of esophagus   . Hiatal hernia   . Diverticulosis   . Arthritis   . ASCVD (arteriosclerotic cardiovascular disease)     on MRI brin  . CAD (coronary artery disease)     a. s/p multiple caths with nonobs CAD;   b. cath 1/10: pLAD 20%, mLAD 40%, pCFX 20%, mCFX 40%, pRCA 60-70%;   c.  Myoview  06/02/12: Low anterior wall scar, no ischemia, EF 37%  . Cardiomyopathy     a. Echo 05/31/12: Severe LVH, EF 40-45%, diffuse HK, grade 1 diastolic dysfunction, mild to moderate calcified aortic valve annulus, mild to moderate aortic stenosis, MAC, mild MR, moderate LAE  . Asthma     Mild  . Pruritic condition     Idiopathic  . Hyperlipidemia   . Zoster   . Noncompliance   . CKD (chronic kidney disease)     Dr. Lorrene Reid    Assessment: [name] presented with shortness of breath, chest and abdominal pain.  Pharmacy consulted to dose heparin for ACS/NSTEMI. Currently on heparin infusion at 900 units/hr. F/u heparin level was sub-therapeutic at 0.23. RN reports no s/s of bleeding.   Goal of Therapy:  Heparin level 0.3-0.7 units/ml Monitor platelets by anticoagulation protocol   Plan:   Heparin bolus 1000 Units X1  Increase Heparin drip to  1050 Units/hour  Obtain heparin level in 8 hours  Daily CBC, heparin level  Albertina Parr, PharmD., BCPS Clinical Pharmacist Pager [phone]

## 2014-06-18 NOTE — Progress Notes (Addendum)
ANTICOAGULATION CONSULT NOTE - Follow Up Consult  Pharmacy Consult for heparin Indication: chest pain/ACS  Allergies  Allergen Reactions  . Iohexol      Code: RASH, Desc: [place] ON PT'S CHART ALLERGIC TO IV DYE 09/04/07/RM, Onset Date: [id number]     Patient Measurements: Height: 5\' 6"  (167.6 cm) Weight: 218 lb (98.884 kg) IBW/kg (Calculated) : 59.3 Heparin Dosing Weight: 81.6kg  Vital Signs: Temp: 98.2 F (36.8 C) (01/02 0732) Temp Source: Oral (01/02 0732) BP: 153/51 mmHg (01/02 0900) Pulse Rate: 56 (01/02 0900)  Labs:  Recent Labs  06/17/14 1329 06/17/14 1538  06/17/14 1946 06/18/14 0038 06/18/14 0625 06/18/14 0945  HGB 13.2  --   --  13.5  --  11.8*  --   HCT 41.4  --   --  41.5  --  37.1  --   PLT 274  --   --  270  --  260  --   APTT  --  32  --  128*  --   --   --   LABPROT  --  13.9  --  15.3*  --  15.2  --   INR  --  1.06  --  1.19  --  1.18  --   HEPARINUNFRC  --   --   --   --  0.23*  --  0.41  CREATININE 1.33*  --   --  1.39*  --  2.10*  --   TROPONINI 2.57*  --   < > 2.71* 3.21* 3.08*  --   < > = values in this interval not displayed.  Estimated Creatinine Clearance: 24.5 mL/min (by C-G formula based on Cr of 2.1).    Assessment: [name] presented with shortness of breath, chest and abdominal pain.  Pharmacy consulted to dose heparin for ACS/NSTEMI. Currently on heparin infusion at 1050 units/hr.   Heparin level currently therapeutic.  No bleeding or complications noted per chart notes.    Goal of Therapy:  Heparin level 0.3-0.7 units/ml Monitor platelets by anticoagulation protocol   Plan:   Continue heparin drip at 1050 Units/hour  Confirm heparin level in 8 hours  Daily CBC, heparin level  Uvaldo Rising, BCPS  Clinical Pharmacist Pager [phone]  06/18/2014 11:44 AM     ADDENDUM Confirmatory level this evening remains therapeutic at 0.38units/mL. Slight drop in Hgb this morning, plts stable- No  bleeding noted.  Goal of Therapy:  Heparin level 0.3-0.7 units/ml Monitor platelets by anticoagulation protocol   Plan: -continue heparin at 1050 units/hr -daily HL and CBC -follow for s/s bleeding and cath plans  Adiyah Lame D. Brycelyn Gambino, PharmD, BCPS Clinical Pharmacist Pager: [phone] 06/18/2014 7:33 PM

## 2014-06-18 NOTE — Progress Notes (Signed)
INITIAL NUTRITION ASSESSMENT  DOCUMENTATION CODES Per approved criteria  -Obesity Unspecified   INTERVENTION: Provide Glucerna Shakes TID, each supplement provides 220 kcal and 10 grams of protein Encourage adequate PO intake   NUTRITION DIAGNOSIS: Inadequate oral intake related to poor appetite as evidenced by pt's report and 15-25% meal completion.   Goal: Pt to meet >/= 90% of their estimated nutrition needs   Monitor:  PO intake, weight trend, labs  Reason for Assessment: Malnutrition Screening Tool, score of 4  81 y.o. female  Admitting Dx: NSTEMI (non-ST elevated myocardial infarction)  ASSESSMENT: 81 yo AAF with a long history of diastolic CHF, nonobstructive CAD, DM2, HTN, HL, sleep apnea and CKD. Admitted to [facility] 08/2200 with a/c diastolic CHF and with elevated enzymes that were 2/2 CHF + CKD.  Pt reports that for the past month her appetite has been poor and she has been eating about 50% less than usual. She reports usual body weight of 218 lbs and 3 lbs weight loss in the past month. She reports drinking Glucerna Shakes TID PTA due to poor PO intake. Per nursing note pt at 15% of breakfast today and 25% of dinner last night.  Labs: elevated glucose, low GFR  Height: Ht Readings from Last 1 Encounters:  06/17/14 5\' 6"  (1.676 m)    Weight: Wt Readings from Last 1 Encounters:  06/17/14 218 lb (98.884 kg)    Ideal Body Weight: 123 lbs (adjusted for BKA)  % Ideal Body Weight: 177%  Wt Readings from Last 10 Encounters:  06/17/14 218 lb (98.884 kg)  06/08/14 218 lb 12.8 oz (99.247 kg)  05/19/14 221 lb 3.2 oz (100.336 kg)  04/13/14 204 lb (92.534 kg)  02/17/14 220 lb (99.791 kg)  01/21/14 219 lb (99.338 kg)  01/13/14 216 lb (97.977 kg)  01/03/14 214 lb (97.07 kg)  12/09/13 203 lb 4.8 oz (92.216 kg)  12/05/13 215 lb 2.7 oz (97.6 kg)    Usual Body Weight: 218 lbs  % Usual Body Weight: 100%  BMI:  Body mass index is 35.2 kg/(m^2).  Estimated Nutritional  Needs: Kcal: 1800-2000 Protein: 100-110 grams Fluid: 2.6 L/day  Skin: WDL  Diet Order: Diet Carb Modified  EDUCATION NEEDS: -No education needs identified at this time   Intake/Output Summary (Last 24 hours) at 06/18/14 1006 Last data filed at 06/18/14 0907  Gross per 24 hour  Intake 1174.93 ml  Output      0 ml  Net 1174.93 ml    Last BM: 1/1   Labs:   Recent Labs Lab 06/17/14 1329 06/17/14 1946 06/18/14 0625  NA 141 138 138  K 4.6 3.7 4.0  CL 107 102 101  CO2 24 27 25   BUN 18 14 21   CREATININE 1.33* 1.39* 2.10*  CALCIUM 8.9 9.4 8.6  MG  --  1.5  --   GLUCOSE 230* 200* 248*    CBG (last 3)   Recent Labs  06/17/14 1255 06/17/14 2203 06/18/14 0730  GLUCAP 208* 224* 208*    Scheduled Meds: . aspirin  325 mg Oral Daily  . calcitRIOL  0.25 mcg Oral Daily  . Chlorhexidine Gluconate Cloth  6 each Topical Q0600  . donepezil  5 mg Oral QHS  . ezetimibe  10 mg Oral q morning - 10a  . feeding supplement (GLUCERNA SHAKE)  237 mL Oral BID  . furosemide  80 mg Intravenous BID  . gabapentin  300 mg Oral QHS  . hydrALAZINE  50 mg Oral TID  .  insulin aspart  0-15 Units Subcutaneous TID WC  . insulin aspart protamine- aspart  18 Units Subcutaneous Q breakfast  . labetalol  100 mg Oral BID  . latanoprost  1 drop Both Eyes QHS  . mometasone-formoterol  2 puff Inhalation BID  . mupirocin ointment  1 application Nasal BID  . pantoprazole  40 mg Oral BID  . potassium chloride SA  20 mEq Oral BID  . sertraline  100 mg Oral Daily    Continuous Infusions: . heparin 1,050 Units/hr (06/18/14 0907)  . nitroGLYCERIN 70 mcg/min (06/18/14 0900)    Past Medical History  Diagnosis Date  . Uterine cancer   . Diabetes mellitus     type II; peripheral neuropathy  . Hypertension   . GERD (gastroesophageal reflux disease)   . Peripheral vascular disease     s/p L BKA 08/2012  . Obesity   . Dyslipidemia   . Diastolic CHF, chronic     EF 50-55%, mild LVH and grade 1  diast. Dysfxn  . Osteoarthritis cervical spine   . DM retinopathy   . Sleep apnea     with CPAP  . History of shingles   . COPD (chronic obstructive pulmonary disease)   . Depression   . Peptic ulcer disease     duodenal  . Peptic stricture of esophagus   . Hiatal hernia   . Diverticulosis   . Arthritis   . ASCVD (arteriosclerotic cardiovascular disease)     on MRI brin  . CAD (coronary artery disease)     a. s/p multiple caths with nonobs CAD;   b. cath 1/10: pLAD 20%, mLAD 40%, pCFX 20%, mCFX 40%, pRCA 60-70%;   c.  Myoview 06/02/12: Low anterior wall scar, no ischemia, EF 37%  . Cardiomyopathy     a. Echo 05/31/12: Severe LVH, EF 40-45%, diffuse HK, grade 1 diastolic dysfunction, mild to moderate calcified aortic valve annulus, mild to moderate aortic stenosis, MAC, mild MR, moderate LAE  . Asthma     Mild  . Pruritic condition     Idiopathic  . Hyperlipidemia   . Zoster   . Noncompliance   . CKD (chronic kidney disease)     Dr. Lorrene Reid    Past Surgical History  Procedure Laterality Date  . Tubal ligation  1967  . Knee arthroscopy  10/1998    Left  . Craniotomy  1997    Left for SDH  . Cataract extraction, bilateral  2005  . Hernia repair    . Esophagogastroduodenoscopy  04/04/2004  . Spine surgery      C-spine and lumbar surgery  . Cholecystectomy  2010  . Cardiac catheterization    . Dexa  7/05  . Amputation Left 09/04/2012    Procedure: AMPUTATION BELOW KNEE;  Surgeon: Angelia Mould, MD;  Location: [place];  Service: Vascular;  Laterality: Left;  . Abdominal angiogram N/A 08/31/2012    Procedure: ABDOMINAL ANGIOGRAM with runoff poss intervention;  Surgeon: Angelia Mould, MD;  Location: [facility] CATH LAB;  Service: Cardiovascular;  Laterality: N/A;  . Tubal ligation      Pryor Ochoa RD, LDN Inpatient Clinical Dietitian Pager: [phone] After Hours Pager: [phone]

## 2014-06-19 DIAGNOSIS — R001 Bradycardia, unspecified: Secondary | ICD-10-CM | POA: Diagnosis present

## 2014-06-19 DIAGNOSIS — Z91041 Radiographic dye allergy status: Secondary | ICD-10-CM | POA: Diagnosis present

## 2014-06-19 DIAGNOSIS — I5043 Acute on chronic combined systolic (congestive) and diastolic (congestive) heart failure: Secondary | ICD-10-CM

## 2014-06-19 LAB — BASIC METABOLIC PANEL
Anion gap: 9 (ref 5–15)
BUN: 32 mg/dL — ABNORMAL HIGH (ref 6–23)
CO2: 29 mmol/L (ref 19–32)
Calcium: 8.6 mg/dL (ref 8.4–10.5)
Chloride: 100 mEq/L (ref 96–112)
Creatinine, Ser: 2.58 mg/dL — ABNORMAL HIGH (ref 0.50–1.10)
GFR calc Af Amer: 19 mL/min — ABNORMAL LOW (ref >90)
GFR calc non Af Amer: 16 mL/min — ABNORMAL LOW (ref >90)
Glucose, Bld: 230 mg/dL — ABNORMAL HIGH (ref 70–99)
Potassium: 4.4 mmol/L (ref 3.5–5.1)
Sodium: 138 mmol/L (ref 135–145)

## 2014-06-19 LAB — GLUCOSE, CAPILLARY
GLUCOSE-CAPILLARY: 186 mg/dL — AB (ref 70–99)
Glucose-Capillary: 211 mg/dL — ABNORMAL HIGH (ref 70–99)
Glucose-Capillary: 185 mg/dL — ABNORMAL HIGH (ref 70–99)
Glucose-Capillary: 246 mg/dL — ABNORMAL HIGH (ref 70–99)
Glucose-Capillary: 234 mg/dL — ABNORMAL HIGH (ref 70–99)

## 2014-06-19 LAB — CBC
HEMATOCRIT: 38.5 % (ref 36.0–46.0)
Hemoglobin: 12.2 g/dL (ref 12.0–15.0)
MCH: 28.5 pg (ref 26.0–34.0)
MCHC: 31.7 g/dL (ref 30.0–36.0)
MCV: 90 fL (ref 78.0–100.0)
PLATELETS: 261 10*3/uL (ref 150–400)
RBC: 4.28 MIL/uL (ref 3.87–5.11)
RDW: 16.5 % — ABNORMAL HIGH (ref 11.5–15.5)
WBC: 7.2 10*3/uL (ref 4.0–10.5)

## 2014-06-19 LAB — HEPARIN LEVEL (UNFRACTIONATED): Heparin Unfractionated: 0.43 IU/mL (ref 0.30–0.70)

## 2014-06-19 LAB — HEMOGLOBIN A1C
HEMOGLOBIN A1C: 6.7 % — AB (ref <5.7)
Mean Plasma Glucose: 146 mg/dL — ABNORMAL HIGH (ref <117)

## 2014-06-19 MED ORDER — ISOSORBIDE MONONITRATE ER 60 MG PO TB24
60.0000 mg | ORAL_TABLET | Freq: Every day | ORAL | Status: DC
  Administered 2014-06-19 – 2014-06-20 (×2): 60 mg via ORAL
  Filled 2014-06-19 (×3): qty 1

## 2014-06-19 MED ORDER — HYDRALAZINE HCL 50 MG PO TABS
100.0000 mg | ORAL_TABLET | Freq: Three times a day (TID) | ORAL | Status: DC
  Administered 2014-06-19 – 2014-06-27 (×25): 100 mg via ORAL
  Filled 2014-06-19 (×27): qty 2

## 2014-06-19 MED ORDER — AMLODIPINE BESYLATE 5 MG PO TABS
5.0000 mg | ORAL_TABLET | Freq: Every day | ORAL | Status: DC
  Administered 2014-06-19 – 2014-06-20 (×2): 5 mg via ORAL
  Filled 2014-06-19 (×2): qty 1

## 2014-06-19 MED ORDER — CLOPIDOGREL BISULFATE 75 MG PO TABS
75.0000 mg | ORAL_TABLET | Freq: Every day | ORAL | Status: DC
  Administered 2014-06-20 – 2014-06-27 (×8): 75 mg via ORAL
  Filled 2014-06-19 (×8): qty 1

## 2014-06-19 MED ORDER — CLOPIDOGREL BISULFATE 300 MG PO TABS
300.0000 mg | ORAL_TABLET | Freq: Once | ORAL | Status: AC
  Administered 2014-06-19: 300 mg via ORAL
  Filled 2014-06-19: qty 1

## 2014-06-19 MED ORDER — ASPIRIN 81 MG PO CHEW
81.0000 mg | CHEWABLE_TABLET | Freq: Every day | ORAL | Status: DC
  Administered 2014-06-19 – 2014-06-27 (×9): 81 mg via ORAL
  Filled 2014-06-19 (×9): qty 1

## 2014-06-19 NOTE — Progress Notes (Addendum)
ANTICOAGULATION CONSULT NOTE - Follow Up Consult  Pharmacy Consult for heparin Indication: chest pain/ACS  Allergies  Allergen Reactions  . Iohexol      Code: RASH, Desc: [place] ON PT'S CHART ALLERGIC TO IV DYE 09/04/07/RM, Onset Date: [id number]     Patient Measurements: Height: 5\' 6"  (167.6 cm) Weight: 218 lb (98.884 kg) IBW/kg (Calculated) : 59.3 Heparin Dosing Weight: 81.6kg  Vital Signs: Temp: 98.7 F (37.1 C) (01/03 1124) Temp Source: Oral (01/03 1124) BP: 139/39 mmHg (01/03 1100) Pulse Rate: 57 (01/03 1100)  Labs:  Recent Labs  06/17/14 1538  06/17/14 1946  06/18/14 0038 06/18/14 0625 06/18/14 0945 06/18/14 1833 06/19/14 0228 06/19/14 0845  HGB  --   --  13.5  --   --  11.8*  --   --   --  12.2  HCT  --   --  41.5  --   --  37.1  --   --   --  38.5  PLT  --   --  270  --   --  260  --   --   --  261  APTT 32  --  128*  --   --   --   --   --   --   --   LABPROT 13.9  --  15.3*  --   --  15.2  --   --   --   --   INR 1.06  --  1.19  --   --  1.18  --   --   --   --   HEPARINUNFRC  --   --   --   < > 0.23*  --  0.41 0.38 0.43  --   CREATININE  --   --  1.39*  --   --  2.10*  --   --   --  2.58*  TROPONINI  --   < > 2.71*  --  3.21* 3.08*  --   --   --   --   < > = values in this interval not displayed.  Estimated Creatinine Clearance: 19.9 mL/min (by C-G formula based on Cr of 2.58).    Assessment: [name] presented with shortness of breath, chest and abdominal pain.  Pharmacy consulted to dose heparin for ACS/NSTEMI. Currently on heparin infusion at 1050 units/hr.   Heparin level currently therapeutic.  No bleeding or complications noted per chart notes.  CBC stable.  Goal of Therapy:  Heparin level 0.3-0.7 units/ml Monitor platelets by anticoagulation protocol   Plan:   Continue heparin drip at 1050 Units/hour  Daily CBC, heparin level  Uvaldo Rising, BCPS  Clinical Pharmacist Pager [phone]  06/19/2014 11:29  AM

## 2014-06-19 NOTE — Progress Notes (Signed)
Patient Name: [name]      SUBJECTIVE: 81 yo admitted with chest pain and shortness of breath with now + TN 2.7 >>3.1  (Last previous 1/14 <0.3)  Plan was to cath later if stable     Has hx of non obstructive CAD ( last cath 2007 --CP and + Tn)  Myoview 06/02/12: Low anterior wall scar, no ischemia, EF 37%. It is felt that she would be high risk for contrast-induced nephropathy given her CKD Echo 2013  > severe LVH and LVEF 40-45%  Has hx of HFpEF and COPD  with recent problems with worseing edema. PND and orthopnea.  Diuretic management has been complicated by Cr 2.4 in 10/15 at which her diuretics were markedly reduced  Most recent Cr 1.4 but overnight >>2.1  Lasix started yday and stopped  Still w vague chest pain, similar to chronic.  Dyspnea better but still not at baseline     Past Medical History  Diagnosis Date  . Uterine cancer   . Diabetes mellitus     type II; peripheral neuropathy  . Hypertension   . GERD (gastroesophageal reflux disease)   . Peripheral vascular disease     s/p L BKA 08/2012  . Obesity   . Dyslipidemia   . Diastolic CHF, chronic     EF 50-55%, mild LVH and grade 1 diast. Dysfxn  . Osteoarthritis cervical spine   . DM retinopathy   . Sleep apnea     with CPAP  . History of shingles   . COPD (chronic obstructive pulmonary disease)   . Depression   . Peptic ulcer disease     duodenal  . Peptic stricture of esophagus   . Hiatal hernia   . Diverticulosis   . Arthritis   . ASCVD (arteriosclerotic cardiovascular disease)     on MRI brin  . CAD (coronary artery disease)     a. s/p multiple caths with nonobs CAD;   b. cath 1/10: pLAD 20%, mLAD 40%, pCFX 20%, mCFX 40%, pRCA 60-70%;   c.  Myoview 06/02/12: Low anterior wall scar, no ischemia, EF 37%  . Cardiomyopathy     a. Echo 05/31/12: Severe LVH, EF 40-45%, diffuse HK, grade 1 diastolic dysfunction, mild to moderate calcified aortic valve annulus, mild to moderate  aortic stenosis, MAC, mild MR, moderate LAE  . Asthma     Mild  . Pruritic condition     Idiopathic  . Hyperlipidemia   . Zoster   . Noncompliance   . CKD (chronic kidney disease)     Dr. Lorrene Reid    Scheduled Meds:  Scheduled Meds: . amLODipine  5 mg Oral Daily  . aspirin  81 mg Oral Daily  . calcitRIOL  0.25 mcg Oral Daily  . Chlorhexidine Gluconate Cloth  6 each Topical Q0600  . donepezil  5 mg Oral QHS  . ezetimibe  10 mg Oral q morning - 10a  . feeding supplement (GLUCERNA SHAKE)  237 mL Oral TID BM  . furosemide  80 mg Intravenous BID  . gabapentin  300 mg Oral QHS  . hydrALAZINE  100 mg Oral TID  . insulin aspart  0-15 Units Subcutaneous TID WC  . insulin aspart protamine- aspart  18 Units Subcutaneous Q breakfast  . isosorbide mononitrate  60 mg Oral Daily  . latanoprost  1 drop Both Eyes QHS  . mometasone-formoterol  2 puff Inhalation BID  . mupirocin ointment  1 application Nasal  BID  . pantoprazole  40 mg Oral BID  . potassium chloride SA  20 mEq Oral BID  . sertraline  100 mg Oral Daily   Continuous Infusions: . heparin 1,050 Units/hr (06/19/14 0544)   acetaminophen, albuterol, bisacodyl, docusate sodium, HYDROcodone-acetaminophen, nitroGLYCERIN, ondansetron (ZOFRAN) IV, polyethylene glycol    PHYSICAL EXAM Filed Vitals:   06/19/14 0400 06/19/14 0500 06/19/14 0600 06/19/14 0755  BP: 163/54 164/43 187/66   Pulse: 53 53 57   Temp: 98.5 F (36.9 C)     TempSrc: Oral     Resp: 17 16 20    Height:      Weight:      SpO2: 99% 99% 97% 97%    General appearance: alert, cooperative and mild distress Neck: JVD - 8 cm above sternal notch and supple, symmetrical, trachea midline Lungs: clear to auscultation bilaterally Heart: regular rate and rhythm, S1, S2 normal, no murmur, click, rub or gallop Abdomen: soft, non-tender; bowel sounds normal; no masses,  no organomegaly Extremities: edema 2 Skin: Skin color, texture, turgor normal. No rashes or  lesions Neurologic: Grossly normal  TELEMETRY: Reviewed telemetry pt in nsr:    Intake/Output Summary (Last 24 hours) at 06/19/14 0847 Last data filed at 06/19/14 0600  Gross per 24 hour  Intake   1170 ml  Output      0 ml  Net   1170 ml    LABS: Basic Metabolic Panel:  Recent Labs Lab 06/17/14 1329 06/17/14 1946 06/18/14 0625  NA 141 138 138  K 4.6 3.7 4.0  CL 107 102 101  CO2 24 27 25   GLUCOSE 230* 200* 248*  BUN 18 14 21   CREATININE 1.33* 1.39* 2.10*  CALCIUM 8.9 9.4 8.6  MG  --  1.5  --    Cardiac Enzymes:  Recent Labs  06/17/14 1946 06/18/14 0038 06/18/14 0625  TROPONINI 2.71* 3.21* 3.08*   CBC:  Recent Labs Lab 06/17/14 1329 06/17/14 1946 06/18/14 0625  WBC 6.5 7.6 7.3  NEUTROABS 4.9 5.4  --   HGB 13.2 13.5 11.8*  HCT 41.4 41.5 37.1  MCV 89.4 87.9 89.0  PLT 274 270 260   PROTIME:  Recent Labs  06/17/14 1538 06/17/14 1946 06/18/14 0625  LABPROT 13.9 15.3* 15.2  INR 1.06 1.19 1.18   Liver Function Tests:  Recent Labs  06/17/14 1329 06/17/14 1946  AST 35 34  ALT 24 22  ALKPHOS 102 103  BILITOT 1.5* 1.2  PROT 6.7 6.9  ALBUMIN 3.7 3.8   No results for input(s): LIPASE, AMYLASE in the last 72 hours. BNP: BNP (last 3 results)  Recent Labs  11/15/13 1205 02/17/14 1202 06/08/14 1617  PROBNP 158.0* 768.0* 1249.0*   D-Dimer: No results for input(s): DDIMER in the last 72 hours. Hemoglobin A1C: No results for input(s): HGBA1C in the last 72 hours. Fasting Lipid Panel: No results for input(s): CHOL, HDL, LDLCALC, TRIG, CHOLHDL, LDLDIRECT in the last 72 hours. Thyroid Function Tests:  Recent Labs  06/17/14 1946  TSH 2.626   Anemia Panel: No results for input(s): VITAMINB12, FOLATE, FERRITIN, TIBC, IRON, RETICCTPCT in the last 72 hours.     ASSESSMENT AND PLAN:  Principal Problem:   NSTEMI (non-ST elevated myocardial infarction) Active Problems:   DM (diabetes mellitus), type 2, uncontrolled, with renal  complications   Hyperlipidemia   Essential hypertension   Coronary atherosclerosis of artery bypass graft   Acute renal failure superimposed on stage 3 chronic kidney disease   Acute on chronic combined  systolic and diastolic congestive heart failure, NYHA class 3   Sinus bradycardia   Contrast media allergy NSTEMI  Tn seems to have reached [place]ogee Continue IV heparin Continue  low dose diuresis Follow Cr closely with question related to riks of CATH given this(  i forgot to order and will do so now) Anticipate myoview Monday for risk stratification Will increase hydralazine for BP Add amlodip[ine D/c  labetolol because of sinus brady  This may be related to her aricept Increase nitrate change to PO Will begin plavix for NSTEMI   Signed, Virl Axe MD  06/19/2014

## 2014-06-20 ENCOUNTER — Inpatient Hospital Stay (HOSPITAL_COMMUNITY): Payer: Medicare Other

## 2014-06-20 DIAGNOSIS — E1169 Type 2 diabetes mellitus with other specified complication: Secondary | ICD-10-CM

## 2014-06-20 DIAGNOSIS — I2510 Atherosclerotic heart disease of native coronary artery without angina pectoris: Secondary | ICD-10-CM

## 2014-06-20 DIAGNOSIS — Z91041 Radiographic dye allergy status: Secondary | ICD-10-CM

## 2014-06-20 DIAGNOSIS — I214 Non-ST elevation (NSTEMI) myocardial infarction: Secondary | ICD-10-CM

## 2014-06-20 LAB — BASIC METABOLIC PANEL
Anion gap: 13 (ref 5–15)
BUN: 35 mg/dL — ABNORMAL HIGH (ref 6–23)
CO2: 23 mmol/L (ref 19–32)
Calcium: 8.8 mg/dL (ref 8.4–10.5)
Chloride: 102 mEq/L (ref 96–112)
Creatinine, Ser: 2.07 mg/dL — ABNORMAL HIGH (ref 0.50–1.10)
GFR calc Af Amer: 25 mL/min — ABNORMAL LOW (ref >90)
GFR calc non Af Amer: 21 mL/min — ABNORMAL LOW (ref >90)
Glucose, Bld: 204 mg/dL — ABNORMAL HIGH (ref 70–99)
Potassium: 4.6 mmol/L (ref 3.5–5.1)
Sodium: 138 mmol/L (ref 135–145)

## 2014-06-20 LAB — GLUCOSE, CAPILLARY
GLUCOSE-CAPILLARY: 336 mg/dL — AB (ref 70–99)
Glucose-Capillary: 185 mg/dL — ABNORMAL HIGH (ref 70–99)
Glucose-Capillary: 122 mg/dL — ABNORMAL HIGH (ref 70–99)
Glucose-Capillary: 187 mg/dL — ABNORMAL HIGH (ref 70–99)

## 2014-06-20 LAB — CBC
HEMATOCRIT: 40.7 % (ref 36.0–46.0)
Hemoglobin: 13 g/dL (ref 12.0–15.0)
MCH: 28.2 pg (ref 26.0–34.0)
MCHC: 31.9 g/dL (ref 30.0–36.0)
MCV: 88.3 fL (ref 78.0–100.0)
Platelets: 228 10*3/uL (ref 150–400)
RBC: 4.61 MIL/uL (ref 3.87–5.11)
RDW: 16.5 % — ABNORMAL HIGH (ref 11.5–15.5)
WBC: 7.9 10*3/uL (ref 4.0–10.5)

## 2014-06-20 LAB — HEPARIN LEVEL (UNFRACTIONATED): Heparin Unfractionated: 0.25 IU/mL — ABNORMAL LOW (ref 0.30–0.70)

## 2014-06-20 MED ORDER — REGADENOSON 0.4 MG/5ML IV SOLN
INTRAVENOUS | Status: AC
  Filled 2014-06-20: qty 5

## 2014-06-20 MED ORDER — METHYLPREDNISOLONE SODIUM SUCC 125 MG IJ SOLR
125.0000 mg | INTRAMUSCULAR | Status: DC
  Filled 2014-06-20: qty 2

## 2014-06-20 MED ORDER — HEPARIN (PORCINE) IN NACL 100-0.45 UNIT/ML-% IJ SOLN
1200.0000 [IU]/h | INTRAMUSCULAR | Status: DC
  Administered 2014-06-20 – 2014-06-21 (×2): 1200 [IU]/h via INTRAVENOUS
  Filled 2014-06-20 (×3): qty 250

## 2014-06-20 MED ORDER — TECHNETIUM TC 99M SESTAMIBI GENERIC - CARDIOLITE
10.0000 | Freq: Once | INTRAVENOUS | Status: AC | PRN
  Administered 2014-06-20: 10 via INTRAVENOUS

## 2014-06-20 MED ORDER — DIPHENHYDRAMINE HCL 50 MG/ML IJ SOLN
25.0000 mg | INTRAMUSCULAR | Status: DC
  Filled 2014-06-20: qty 1

## 2014-06-20 MED ORDER — SODIUM CHLORIDE 0.9 % IV SOLN
1.0000 mL/kg/h | INTRAVENOUS | Status: DC
  Administered 2014-06-20 – 2014-06-21 (×2): 1 mL/kg/h via INTRAVENOUS

## 2014-06-20 MED ORDER — REGADENOSON 0.4 MG/5ML IV SOLN
0.4000 mg | Freq: Once | INTRAVENOUS | Status: AC
  Administered 2014-06-20: 0.4 mg via INTRAVENOUS

## 2014-06-20 MED ORDER — TECHNETIUM TC 99M SESTAMIBI GENERIC - CARDIOLITE
30.0000 | Freq: Once | INTRAVENOUS | Status: AC | PRN
  Administered 2014-06-20: 30 via INTRAVENOUS

## 2014-06-20 NOTE — Progress Notes (Signed)
    Subjective:  Still has intermittent CP that is mild.  NO dyspnea,  Objective:  Vital Signs in the last 24 hours: Temp:  [98 F (36.7 C)-98.6 F (37 C)] 98.1 F (36.7 C) (01/04 1110) Pulse Rate:  [57-84] 63 (01/04 1110) Resp:  [11-28] 21 (01/04 1110) BP: (128-219)/(34-91) 167/65 mmHg (01/04 1100) SpO2:  [96 %-100 %] 99 % (01/04 1110)  Intake/Output from previous day: 01/03 0701 - 01/04 0700 In: 1186.5 [P.O.:840; I.V.:346.5] Out: -  Intake/Output from this shift: Total I/O In: 45.2 [I.V.:45.2] Out: -   Physical Exam: General appearance: alert, cooperative, appears stated age, no distress, moderately obese and pleasant mood & affect Neck: no adenopathy, no carotid bruit and no JVD Lungs: clear to auscultation bilaterally and normal percussion bilaterally Heart: regular rate and rhythm, S1, S2 normal, no murmur, click, rub or gallop Abdomen: soft, non-tender; bowel sounds normal; no masses,  no organomegaly Extremities: extremities normal, atraumatic, no cyanosis or edema and L BKA Pulses: R foot pulse intact Neurologic: Grossly normal  Lab Results:  Recent Labs  06/19/14 0845 06/20/14 0325  WBC 7.2 7.9  HGB 12.2 13.0  PLT 261 228    Recent Labs  06/19/14 0845 06/20/14 0325  NA 138 138  K 4.4 4.6  CL 100 102  CO2 29 23  GLUCOSE 230* 204*  BUN 32* 35*  CREATININE 2.58* 2.07*    Recent Labs  06/18/14 0038 06/18/14 0625  TROPONINI 3.21* 3.08*   Hepatic Function Panel  Recent Labs  06/17/14 1946  PROT 6.9  ALBUMIN 3.8  AST 34  ALT 22  ALKPHOS 103  BILITOT 1.2   No results for input(s): CHOL in the last 72 hours. No results for input(s): PROTIME in the last 72 hours.  Imaging: Imaging results have been reviewed; Nuc Med Pharm ST pending today  Cardiac Studies:  TELE: NSR  Assessment/Plan:  Principal Problem:   NSTEMI (non-ST elevated myocardial infarction) Active Problems:   Contrast media allergy   DM (diabetes mellitus), type 2,  uncontrolled, with renal complications   Hyperlipidemia   Obstructive sleep apnea   Coronary artery disease due to lipid rich plaque; Non-obstructive CAD   Acute renal failure superimposed on stage 3 chronic kidney disease   Hypertension associated with diabetes   Acute on chronic combined systolic and diastolic congestive heart failure, NYHA class 3   Sinus bradycardia   Essential hypertension  Renal Fxn has improved today; On Plavix for NSTEMI along with IV Heparin.   Plan is Risk Stratification by Myoview today - to ensure reasonable chance of finding obstructive CAD on cath  If cath indicated, will need pre-med for contrast reaction & IV hydration --> no LV Gram & consider staged PCI.  Continue Imdur  Has reported HF - but no echo since 2013. Will await EF from Myoview, but probably needs relook Echo at some point.   BP elevated today - has not had PO meds (held for Nuc Stress Test) - will monitor, BB was stopped for bradycardia, may need to increase Amlodipine  Has reported h/o of Statin intolerance - on Zetia alone (can defer to Primary Cardiologist)  Glycemic control borderline with CBGs in ~200 range -- once taking PO, will restart PM dose of 70/30 -- use SSI for now   LOS: 3 days    [name] W 06/20/2014, 11:45 AM

## 2014-06-20 NOTE — Progress Notes (Signed)
Inpatient Diabetes Program Recommendations  AACE/ADA: New Consensus Statement on Inpatient Glycemic Control (2013)  Target Ranges:  Prepandial:   less than 140 mg/dL      Peak postprandial:   less than 180 mg/dL (1-2 hours)      Critically ill patients:  140 - 180 mg/dL   Consider changing Novolog correction to Q4 while NPO.  Once diet is ordered will need 70/30 with breakfast and supper per home med rec.  Will follow. Thank you  Raoul Pitch BSN, RN,CDE Inpatient Diabetes Coordinator [phone] (team pager)

## 2014-06-20 NOTE — Care Management Note (Addendum)
    Page 1 of 1   06/27/2014     2:46:39 PM CARE MANAGEMENT NOTE 06/27/2014  Patient:  [name], [name]   Account Number:  [id number]  Date Initiated:  06/20/2014  Documentation initiated by:  Elissa Hefty  Subjective/Objective Assessment:   adm w nstemi     Action/Plan:   lives w husband, pcp dr Renato Shin   Anticipated DC Date:  06/27/2014   Anticipated DC Plan:  [place]   Choice offered to / List presented to:  C-1 Patient        [place] arranged  HH-2 PT      [place]   Status of service:  Completed, signed off Medicare Important Message given?  YES (If response is "NO", the following Medicare IM given date fields will be blank) Date Medicare IM given:  06/20/2014 Medicare IM given by:  Elissa Hefty Date Additional Medicare IM given:  06/27/2014 Additional Medicare IM given by:  Marvetta Gibbons  Discharge Disposition:  [place]  Per UR Regulation:  Reviewed for med. necessity/level of care/duration of stay  If discussed at [place] of Stay Meetings, dates discussed:   06/23/2014    Comments:  06/27/14- 1200- Marvetta Gibbons RN, BSN [phone] Pt for d/c home today, order for HH-PT - as previously discussed pt would like to use [facility] for [facility] services- Referral called to [facility] with [facility]-  06/24/14- 1200- Valentina Gu, BSN [phone] Spoke with pt at bedside- per conversation pt states that she has used [place] in past for [facility] services- if needed would prefer to use them again- she has needed DME at home- NCM to continue to follow

## 2014-06-20 NOTE — Progress Notes (Signed)
ANTICOAGULATION CONSULT NOTE - Follow Up Consult  Pharmacy Consult for Heparin Indication: chest pain/ACS  Allergies  Allergen Reactions  . Iohexol      Code: RASH, Desc: [place] ON PT'S CHART ALLERGIC TO IV DYE 09/04/07/RM, Onset Date: [id number]     Patient Measurements: Height: 5\' 6"  (167.6 cm) Weight: 218 lb (98.884 kg) IBW/kg (Calculated) : 59.3 Heparin Dosing Weight: ~81.5kg  Vital Signs: Temp: 98.4 F (36.9 C) (01/04 0719) Temp Source: Oral (01/04 0719) BP: 128/48 mmHg (01/04 0700) Pulse Rate: 63 (01/04 0719)  Labs:  Recent Labs  06/17/14 1538  06/17/14 1946 06/18/14 0038 06/18/14 0165  06/18/14 1833 06/19/14 0228 06/19/14 0845 06/20/14 0325  HGB  --   --  13.5  --  11.8*  --   --   --  12.2 13.0  HCT  --   --  41.5  --  37.1  --   --   --  38.5 40.7  PLT  --   --  270  --  260  --   --   --  261 228  APTT 32  --  128*  --   --   --   --   --   --   --   LABPROT 13.9  --  15.3*  --  15.2  --   --   --   --   --   INR 1.06  --  1.19  --  1.18  --   --   --   --   --   HEPARINUNFRC  --   --   --  0.23*  --   < > 0.38 0.43  --  0.25*  CREATININE  --   --  1.39*  --  2.10*  --   --   --  2.58* 2.07*  TROPONINI  --   < > 2.71* 3.21* 3.08*  --   --   --   --   --   < > = values in this interval not displayed.  Estimated Creatinine Clearance: 24.8 mL/min (by C-G formula based on Cr of 2.07).   Medications:  Heparin @ 1050 units/hr  Assessment: [name] continues on heparin for NSTEMI with plans for myoview today. Heparin level has dropped below goal today at 0.25. CBC remains stable. No bleeding reported.  Goal of Therapy:  Heparin level 0.3-0.7 units/ml Monitor platelets by anticoagulation protocol: Yes   Plan:  1) Increase heparin to 1200 units/hr 2) Follow up heparin level, CBC in AM 3) Follow up after Annia Belt 06/20/2014,8:05 AM

## 2014-06-20 NOTE — Progress Notes (Signed)
1308 06/20/14 to NM via bed for Myoview study. Roselinda Bahena, Carolynn Comment

## 2014-06-21 DIAGNOSIS — R9439 Abnormal result of other cardiovascular function study: Secondary | ICD-10-CM | POA: Diagnosis not present

## 2014-06-21 LAB — GLUCOSE, CAPILLARY
GLUCOSE-CAPILLARY: 337 mg/dL — AB (ref 70–99)
GLUCOSE-CAPILLARY: 238 mg/dL — AB (ref 70–99)
Glucose-Capillary: 239 mg/dL — ABNORMAL HIGH (ref 70–99)
Glucose-Capillary: 217 mg/dL — ABNORMAL HIGH (ref 70–99)

## 2014-06-21 LAB — HEPARIN LEVEL (UNFRACTIONATED): Heparin Unfractionated: 0.29 IU/mL — ABNORMAL LOW (ref 0.30–0.70)

## 2014-06-21 LAB — CBC
HCT: 40.2 % (ref 36.0–46.0)
Hemoglobin: 12.5 g/dL (ref 12.0–15.0)
MCH: 27.5 pg (ref 26.0–34.0)
MCHC: 31.1 g/dL (ref 30.0–36.0)
MCV: 88.5 fL (ref 78.0–100.0)
PLATELETS: 279 10*3/uL (ref 150–400)
RBC: 4.54 MIL/uL (ref 3.87–5.11)
RDW: 16.4 % — AB (ref 11.5–15.5)
WBC: 7.5 10*3/uL (ref 4.0–10.5)

## 2014-06-21 LAB — BASIC METABOLIC PANEL
Anion gap: 8 (ref 5–15)
BUN: 31 mg/dL — ABNORMAL HIGH (ref 6–23)
CO2: 31 mmol/L (ref 19–32)
Calcium: 8.9 mg/dL (ref 8.4–10.5)
Chloride: 99 mEq/L (ref 96–112)
Creatinine, Ser: 1.69 mg/dL — ABNORMAL HIGH (ref 0.50–1.10)
GFR calc Af Amer: 31 mL/min — ABNORMAL LOW (ref >90)
GFR calc non Af Amer: 27 mL/min — ABNORMAL LOW (ref >90)
Glucose, Bld: 304 mg/dL — ABNORMAL HIGH (ref 70–99)
Potassium: 4.9 mmol/L (ref 3.5–5.1)
Sodium: 138 mmol/L (ref 135–145)

## 2014-06-21 MED ORDER — SODIUM CHLORIDE 0.9 % IV SOLN
INTRAVENOUS | Status: DC
  Administered 2014-06-22: 06:00:00 via INTRAVENOUS

## 2014-06-21 MED ORDER — HEPARIN (PORCINE) IN NACL 100-0.45 UNIT/ML-% IJ SOLN
1300.0000 [IU]/h | INTRAMUSCULAR | Status: DC
  Administered 2014-06-21 – 2014-06-22 (×2): 1300 [IU]/h via INTRAVENOUS
  Filled 2014-06-21 (×4): qty 250

## 2014-06-21 MED ORDER — SODIUM CHLORIDE 0.9 % IJ SOLN
3.0000 mL | Freq: Two times a day (BID) | INTRAMUSCULAR | Status: DC
  Administered 2014-06-21 – 2014-06-22 (×2): 3 mL via INTRAVENOUS

## 2014-06-21 MED ORDER — HYDRALAZINE HCL 20 MG/ML IJ SOLN
10.0000 mg | Freq: Once | INTRAMUSCULAR | Status: AC
  Administered 2014-06-21: 10 mg via INTRAVENOUS
  Filled 2014-06-21: qty 1

## 2014-06-21 MED ORDER — NITROGLYCERIN IN D5W 200-5 MCG/ML-% IV SOLN
0.0000 ug/min | INTRAVENOUS | Status: DC
  Administered 2014-06-21: 10 ug/min via INTRAVENOUS
  Administered 2014-06-22: 30 ug/min via INTRAVENOUS
  Administered 2014-06-22: 40 ug/min via INTRAVENOUS
  Filled 2014-06-21 (×3): qty 250

## 2014-06-21 MED ORDER — ASPIRIN 81 MG PO CHEW
81.0000 mg | CHEWABLE_TABLET | ORAL | Status: AC
  Administered 2014-06-22: 81 mg via ORAL
  Filled 2014-06-21: qty 1

## 2014-06-21 MED ORDER — NEBIVOLOL HCL 5 MG PO TABS
5.0000 mg | ORAL_TABLET | Freq: Every day | ORAL | Status: DC
  Administered 2014-06-21 – 2014-06-22 (×2): 5 mg via ORAL
  Filled 2014-06-21 (×3): qty 1

## 2014-06-21 MED ORDER — METHYLPREDNISOLONE SODIUM SUCC 125 MG IJ SOLR
125.0000 mg | Freq: Once | INTRAMUSCULAR | Status: DC
  Filled 2014-06-21: qty 2

## 2014-06-21 MED ORDER — PREDNISONE 20 MG PO TABS
40.0000 mg | ORAL_TABLET | Freq: Every day | ORAL | Status: DC
  Administered 2014-06-21: 40 mg via ORAL
  Filled 2014-06-21 (×2): qty 2

## 2014-06-21 MED ORDER — AMLODIPINE BESYLATE 10 MG PO TABS
10.0000 mg | ORAL_TABLET | Freq: Once | ORAL | Status: AC
  Administered 2014-06-21: 10 mg via ORAL
  Filled 2014-06-21: qty 1

## 2014-06-21 MED ORDER — SODIUM CHLORIDE 0.9 % IV SOLN: INTRAVENOUS | Status: DC

## 2014-06-21 MED ORDER — SODIUM CHLORIDE 0.9 % IV SOLN: 250.0000 mL | INTRAVENOUS | Status: DC | PRN

## 2014-06-21 MED ORDER — CETYLPYRIDINIUM CHLORIDE 0.05 % MT LIQD
7.0000 mL | Freq: Two times a day (BID) | OROMUCOSAL | Status: DC
  Administered 2014-06-22 – 2014-06-27 (×11): 7 mL via OROMUCOSAL

## 2014-06-21 MED ORDER — SODIUM CHLORIDE 0.9 % IJ SOLN
3.0000 mL | INTRAMUSCULAR | Status: DC | PRN
  Administered 2014-06-21: 3 mL via INTRAVENOUS
  Filled 2014-06-21: qty 3

## 2014-06-21 MED ORDER — AMLODIPINE BESYLATE 10 MG PO TABS
10.0000 mg | ORAL_TABLET | Freq: Every day | ORAL | Status: DC
  Administered 2014-06-21 – 2014-06-27 (×7): 10 mg via ORAL
  Filled 2014-06-21 (×7): qty 1

## 2014-06-21 MED ORDER — DIPHENHYDRAMINE HCL 50 MG/ML IJ SOLN
25.0000 mg | INTRAMUSCULAR | Status: AC
  Filled 2014-06-21: qty 1

## 2014-06-21 NOTE — Progress Notes (Signed)
ANTICOAGULATION CONSULT NOTE - Follow Up Consult  Pharmacy Consult for Heparin Indication: chest pain/ACS  Allergies  Allergen Reactions  . Iohexol      Code: RASH, Desc: [place] ON PT'S CHART ALLERGIC TO IV DYE 09/04/07/RM, Onset Date: [id number]     Patient Measurements: Height: 5\' 6"  (167.6 cm) Weight: 207 lb (93.895 kg) IBW/kg (Calculated) : 59.3 Heparin Dosing Weight: ~81.5kg  Vital Signs: Temp: 97.4 F (36.3 C) (01/05 1155) Temp Source: Oral (01/05 1155) BP: 164/58 mmHg (01/05 1400) Pulse Rate: 68 (01/05 1400)  Labs:  Recent Labs  06/19/14 0228  06/19/14 0845 06/20/14 0325 06/21/14 0245  HGB  --   < > 12.2 13.0 12.5  HCT  --   --  38.5 40.7 40.2  PLT  --   --  261 228 279  HEPARINUNFRC 0.43  --   --  0.25* 0.29*  CREATININE  --   --  2.58* 2.07* 1.69*  < > = values in this interval not displayed.  Estimated Creatinine Clearance: 29.6 mL/min (by C-G formula based on Cr of 1.69).   Medications:  Heparin @ 1200 units/hr  Assessment: 84yof continues on heparin for NSTEMI. Myoview yesterday showed intermediate risk. She will need a cath tomorrow. Heparin level is just slightly below goal at 0.29. CBC remains stable. No bleeding reported.  Goal of Therapy:  Heparin level 0.3-0.7 units/ml Monitor platelets by anticoagulation protocol: Yes   Plan:  1) Increase heparin to 1300 units/hr 2) Follow up heparin level, CBC in AM 3) Follow up after cath 1/6  Deboraha Sprang 06/21/2014,2:12 PM

## 2014-06-21 NOTE — Progress Notes (Signed)
81 year old woman with known coronary disease status post prior PCI admitted for dyspnea with troponin elevation suggestive of non-STEMI with acute and chronic heart failure. Stabilized medically. Myoview scan performed January 4 - with possible inferolateral hypo-perfusion possible ischemia. Read as intermediate risk.  Subjective:  Chest discomfort less noticeable. No longer dyspneic. Was profoundly hypertensive overnight, Imdur DC'd and placed on nitroglycerin infusion.  Objective:  Vital Signs in the last 24 hours: Temp:  [97.4 F (36.3 C)-98.3 F (36.8 C)] 97.4 F (36.3 C) (01/05 1155) Pulse Rate:  [58-79] 66 (01/05 1500) Resp:  [15-27] 22 (01/05 1500) BP: (114-205)/(29-132) 170/58 mmHg (01/05 1500) SpO2:  [96 %-100 %] 98 % (01/05 1500) FiO2 (%):  [28 %] 28 % (01/05 1012) Weight:  [207 lb (93.895 kg)] 207 lb (93.895 kg) (01/05 0556)  Intake/Output from previous day: 01/04 0701 - 01/05 0700 In: 1572.8 [P.O.:360; I.V.:1212.8] Out: -  Intake/Output from this shift: Total I/O In: 693.2 [P.O.:200; I.V.:493.2] Out: -   Physical Exam: General appearance: alert, cooperative, appears stated age, no distress, moderately obese and pleasant mood & affect Neck: no adenopathy, no carotid bruit and no JVD Lungs: clear to auscultation bilaterally and normal percussion bilaterally Heart: regular rate and rhythm, S1, S2 normal, no murmur, click, rub or gallop Abdomen: soft, non-tender; bowel sounds normal; no masses,  no organomegaly Extremities: extremities normal, atraumatic, no cyanosis or edema and L BKA Pulses: R foot pulse intact Neurologic: Grossly normal  Lab Results:  Recent Labs  06/20/14 0325 06/21/14 0245  WBC 7.9 7.5  HGB 13.0 12.5  PLT 228 279    Recent Labs  06/20/14 0325 06/21/14 0245  NA 138 138  K 4.6 4.9  CL 102 99  CO2 23 31  GLUCOSE 204* 304*  BUN 35* 31*  CREATININE 2.07* 1.69*   No results for input(s): TROPONINI in the last 72  hours.  Invalid input(s): CK, MB Hepatic Function Panel  Imaging: Imaging results have been reviewed; Nuc Med Pharm ST pending today  Cardiac Studies:  TELE: NSR  Myoview 1/4: small, mild reversal fusion defect in the inferolateral wall cannot rule out ischemia. EF 44% with global hypokinesis. INTERMEDIATE RISK  Assessment/Plan:  Principal Problem:   NSTEMI (non-ST elevated myocardial infarction) Active Problems:   Contrast media allergy   DM (diabetes mellitus), type 2, uncontrolled, with renal complications   Hyperlipidemia   Obstructive sleep apnea   Coronary artery disease due to lipid rich plaque; Non-obstructive CAD   Acute renal failure superimposed on stage 3 chronic kidney disease   Hypertension associated with diabetes   Acute on chronic combined systolic and diastolic congestive heart failure, NYHA class 3   Sinus bradycardia   Essential hypertension  Renal Fxn has continues to improve On Plavix for NSTEMI along with IV Heparin.    Abnormal Myoview with recent non-STEMI. Plan would be to consider coronary angiography. Unfortunately due to scheduling difficulty with a fully booked Cath Labshe will not be able to undergo cardiac catheterization today. This will allow for additional blood pressure monitoring and control as well as some additional day to recover from her renal insufficiency.   For heart catheterization tono LV Gram & consider staged PCI. -- patient has asked to consider left radial versus left femoral access.  Has reported HF - but no echo since 2013. EF 44% by Myoview. We'll check 2-D echocardiogram.   BP Remains ontoday - Will add Bystolic for BP control in order to avoid bradycardia.  Has reported h/o  of Statin intolerance - on Zetia alone (can defer to Primary Cardiologist)  Glycemic control borderline with CBGs in ~200 range -- once taking PO, will restart PM dose of 70/30 -- use SSI for now   LOS: 4 days    HARDING, DAVID W 06/21/2014,  4:00 PM

## 2014-06-22 ENCOUNTER — Encounter (HOSPITAL_COMMUNITY): Payer: Self-pay | Admitting: Cardiovascular Disease

## 2014-06-22 ENCOUNTER — Encounter (HOSPITAL_COMMUNITY)
Admission: EM | Disposition: A | Discharge status: Home / Self Care | Payer: Self-pay | Source: Home / Self Care | Attending: Cardiology

## 2014-06-22 DIAGNOSIS — I2510 Atherosclerotic heart disease of native coronary artery without angina pectoris: Secondary | ICD-10-CM

## 2014-06-22 DIAGNOSIS — E875 Hyperkalemia: Secondary | ICD-10-CM | POA: Diagnosis not present

## 2014-06-22 HISTORY — PX: LEFT HEART CATHETERIZATION WITH CORONARY ANGIOGRAM: SHX5451

## 2014-06-22 LAB — BASIC METABOLIC PANEL
ANION GAP: 10 (ref 5–15)
Anion gap: 7 (ref 5–15)
Anion gap: 11 (ref 5–15)
BUN: 34 mg/dL — ABNORMAL HIGH (ref 6–23)
BUN: 34 mg/dL — ABNORMAL HIGH (ref 6–23)
BUN: 36 mg/dL — AB (ref 6–23)
CALCIUM: 9.1 mg/dL (ref 8.4–10.5)
CALCIUM: 9 mg/dL (ref 8.4–10.5)
CO2: 27 mmol/L (ref 19–32)
CO2: 23 mmol/L (ref 19–32)
CO2: 27 mmol/L (ref 19–32)
CREATININE: 1.73 mg/dL — AB (ref 0.50–1.10)
Calcium: 8.9 mg/dL (ref 8.4–10.5)
Chloride: 101 mEq/L (ref 96–112)
Chloride: 102 mEq/L (ref 96–112)
Chloride: 99 mEq/L (ref 96–112)
Creatinine, Ser: 1.77 mg/dL — ABNORMAL HIGH (ref 0.50–1.10)
Creatinine, Ser: 1.93 mg/dL — ABNORMAL HIGH (ref 0.50–1.10)
GFR calc Af Amer: 30 mL/min — ABNORMAL LOW (ref >90)
GFR calc Af Amer: 30 mL/min — ABNORMAL LOW (ref >90)
GFR calc Af Amer: 27 mL/min — ABNORMAL LOW (ref >90)
GFR calc non Af Amer: 26 mL/min — ABNORMAL LOW (ref >90)
GFR calc non Af Amer: 26 mL/min — ABNORMAL LOW (ref >90)
GFR calc non Af Amer: 23 mL/min — ABNORMAL LOW (ref >90)
Glucose, Bld: 373 mg/dL — ABNORMAL HIGH (ref 70–99)
Glucose, Bld: 398 mg/dL — ABNORMAL HIGH (ref 70–99)
Glucose, Bld: 392 mg/dL — ABNORMAL HIGH (ref 70–99)
POTASSIUM: 6.6 mmol/L — AB (ref 3.5–5.1)
POTASSIUM: 4.5 mmol/L (ref 3.5–5.1)
Potassium: 6.6 mmol/L (ref 3.5–5.1)
SODIUM: 135 mmol/L (ref 135–145)
SODIUM: 135 mmol/L (ref 135–145)
Sodium: 137 mmol/L (ref 135–145)

## 2014-06-22 LAB — CBC
HCT: 41.3 % (ref 36.0–46.0)
Hemoglobin: 13 g/dL (ref 12.0–15.0)
MCH: 28.8 pg (ref 26.0–34.0)
MCHC: 31.5 g/dL (ref 30.0–36.0)
MCV: 91.4 fL (ref 78.0–100.0)
Platelets: 288 10*3/uL (ref 150–400)
RBC: 4.52 MIL/uL (ref 3.87–5.11)
RDW: 16.6 % — AB (ref 11.5–15.5)
WBC: 6.7 10*3/uL (ref 4.0–10.5)

## 2014-06-22 LAB — GLUCOSE, CAPILLARY
GLUCOSE-CAPILLARY: 463 mg/dL — AB (ref 70–99)
GLUCOSE-CAPILLARY: 219 mg/dL — AB (ref 70–99)
Glucose-Capillary: 363 mg/dL — ABNORMAL HIGH (ref 70–99)
Glucose-Capillary: 402 mg/dL — ABNORMAL HIGH (ref 70–99)
Glucose-Capillary: 336 mg/dL — ABNORMAL HIGH (ref 70–99)

## 2014-06-22 LAB — HEPARIN LEVEL (UNFRACTIONATED): Heparin Unfractionated: 0.51 IU/mL (ref 0.30–0.70)

## 2014-06-22 SURGERY — LEFT HEART CATHETERIZATION WITH CORONARY ANGIOGRAM

## 2014-06-22 MED ORDER — NITROGLYCERIN 1 MG/10 ML FOR IR/CATH LAB
INTRA_ARTERIAL | Status: AC
Start: 2014-06-22 — End: 2014-06-22
  Filled 2014-06-22: qty 10

## 2014-06-22 MED ORDER — PREDNISONE 20 MG PO TABS
40.0000 mg | ORAL_TABLET | Freq: Once | ORAL | Status: AC
  Administered 2014-06-22: 40 mg via ORAL
  Filled 2014-06-22: qty 2

## 2014-06-22 MED ORDER — HEPARIN (PORCINE) IN NACL 2-0.9 UNIT/ML-% IJ SOLN
INTRAMUSCULAR | Status: AC
  Filled 2014-06-22: qty 1000

## 2014-06-22 MED ORDER — LIDOCAINE HCL (PF) 1 % IJ SOLN
INTRAMUSCULAR | Status: AC
  Filled 2014-06-22: qty 30

## 2014-06-22 MED ORDER — INSULIN ASPART 100 UNIT/ML ~~LOC~~ SOLN
0.0000 [IU] | SUBCUTANEOUS | Status: DC
  Administered 2014-06-22: 15 [IU] via SUBCUTANEOUS
  Administered 2014-06-22: 5 [IU] via SUBCUTANEOUS

## 2014-06-22 MED ORDER — SODIUM BICARBONATE 8.4 % IV SOLN
Freq: Once | INTRAVENOUS | Status: AC
  Administered 2014-06-22: 08:00:00 via INTRAVENOUS
  Filled 2014-06-22: qty 100

## 2014-06-22 MED ORDER — INSULIN ASPART 100 UNIT/ML ~~LOC~~ SOLN: 0.0000 [IU] | Freq: Three times a day (TID) | SUBCUTANEOUS | Status: DC

## 2014-06-22 MED ORDER — FAMOTIDINE IN NACL 20-0.9 MG/50ML-% IV SOLN
INTRAVENOUS | Status: AC
  Filled 2014-06-22: qty 50

## 2014-06-22 MED ORDER — SODIUM CHLORIDE 0.9 % IV SOLN
INTRAVENOUS | Status: AC
Start: 2014-06-22 — End: 2014-06-23
  Administered 2014-06-22: 17:00:00 via INTRAVENOUS

## 2014-06-22 MED ORDER — ALBUTEROL SULFATE (2.5 MG/3ML) 0.083% IN NEBU
10.0000 mg | INHALATION_SOLUTION | Freq: Once | RESPIRATORY_TRACT | Status: AC
  Administered 2014-06-22: 10 mg via RESPIRATORY_TRACT
  Filled 2014-06-22: qty 12

## 2014-06-22 MED ORDER — INSULIN ASPART 100 UNIT/ML ~~LOC~~ SOLN
0.0000 [IU] | Freq: Every day | SUBCUTANEOUS | Status: DC
  Administered 2014-06-22: 4 [IU] via SUBCUTANEOUS

## 2014-06-22 MED ORDER — INSULIN ASPART 100 UNIT/ML IV SOLN
10.0000 [IU] | Freq: Once | INTRAVENOUS | Status: AC
  Administered 2014-06-22: 10 [IU] via INTRAVENOUS

## 2014-06-22 MED ORDER — HEPARIN SODIUM (PORCINE) 1000 UNIT/ML IJ SOLN
INTRAMUSCULAR | Status: AC
  Filled 2014-06-22: qty 1

## 2014-06-22 MED ORDER — SODIUM POLYSTYRENE SULFONATE 15 GM/60ML PO SUSP
30.0000 g | Freq: Once | ORAL | Status: AC
Start: 2014-06-22 — End: 2014-06-22
  Administered 2014-06-22: 30 g via ORAL
  Filled 2014-06-22: qty 120

## 2014-06-22 MED ORDER — DIPHENHYDRAMINE HCL 50 MG/ML IJ SOLN
INTRAMUSCULAR | Status: AC
  Filled 2014-06-22: qty 1

## 2014-06-22 MED ORDER — INSULIN ASPART 100 UNIT/ML ~~LOC~~ SOLN
20.0000 [IU] | Freq: Once | SUBCUTANEOUS | Status: AC
  Administered 2014-06-22: 20 [IU] via SUBCUTANEOUS

## 2014-06-22 MED ORDER — FUROSEMIDE 10 MG/ML IJ SOLN
80.0000 mg | Freq: Once | INTRAMUSCULAR | Status: AC
  Administered 2014-06-22: 80 mg via INTRAVENOUS
  Filled 2014-06-22: qty 8

## 2014-06-22 MED ORDER — METHYLPREDNISOLONE SODIUM SUCC 125 MG IJ SOLR
INTRAMUSCULAR | Status: AC
Start: 2014-06-22 — End: 2014-06-22
  Filled 2014-06-22: qty 2

## 2014-06-22 MED ORDER — DEXTROSE 50 % IV SOLN
1.0000 | Freq: Once | INTRAVENOUS | Status: AC
  Administered 2014-06-22: 50 mL via INTRAVENOUS
  Filled 2014-06-22: qty 50

## 2014-06-22 NOTE — Progress Notes (Signed)
CRITICAL VALUE ALERT  Critical value received:  Potassium 6.6  Date of notification:  06/22/13  Time of notification:  0527  Critical value read back:Yes.    Nurse who received alert:  Arma Heading  MD notified (1st page):  Dr. Tommi Rumps  Time of first page:  0527  MD notified (2nd page):  Time of second page:  Responding MD:  Dr. Tommi Rumps  Time MD responded:  [phone]

## 2014-06-22 NOTE — H&P (View-Only) (Signed)
81 year old woman with known coronary disease status post prior PCI admitted for dyspnea with troponin elevation suggestive of non-STEMI with acute and chronic heart failure. Stabilized medically. Myoview scan performed January 4 - with possible inferolateral hypo-perfusion possible ischemia. Read as intermediate risk.  Subjective:  No further chest pain. Currently getting a breathing treatment, but denies any worsening dyspnea. Heart pressure slightly better control on IV nitroglycerin infusion after the addition of bystolic. Potassium this morning was 6.6, up from 4.9 yesterday. Potentially related to continuing to receive low-dose supplementation and not getting oral Lasix. Was given insulin and Kayexalate by the on-call team. I have also written for IV Lasix to be given with her precath hydration this morning.  Objective:  Vital Signs in the last 24 hours: Temp:  [97.1 F (36.2 C)-98.5 F (36.9 C)] 97.1 F (36.2 C) (01/06 1223) Pulse Rate:  [52-79] 67 (01/06 1200) Resp:  [15-27] 23 (01/06 1200) BP: (115-183)/(41-131) 141/53 mmHg (01/06 1200) SpO2:  [93 %-100 %] 98 % (01/06 1200)  Intake/Output from previous day: 01/05 0701 - 01/06 0700 In: 2394.6 [P.O.:350; I.V.:2044.6] Out: -  Intake/Output from this shift: Total I/O In: 852.9 [I.V.:852.9] Out: -   Physical Exam: General appearance: alert, cooperative, appears stated age, no distress, moderately obese and pleasant mood & affect Neck: no adenopathy, no carotid bruit and no JVD Lungs: clear to auscultation bilaterally and normal percussion bilaterally - intermittent bibasilar wheezing, but no rales Heart: RRR S1, S2 normal, no murmur, click, rub or gallop Abdomen: soft, non-tender; bowel sounds normal; no masses,  no organomegaly Extremities: extremities normal, atraumatic, no cyanosis or edema and L BKA Pulses: R foot pulse intact Neurologic: Grossly normal  Lab Results:  Recent Labs  06/21/14 0245 06/22/14 0219    WBC 7.5 6.7  HGB 12.5 13.0  PLT 279 288    Recent Labs  06/22/14 0409 06/22/14 0555  NA 135 135  K 6.6* 6.6*  CL 101 102  CO2 27 23  GLUCOSE 373* 398*  BUN 34* 34*  CREATININE 1.77* 1.73*   No results for input(s): TROPONINI in the last 72 hours.  Invalid input(s): CK, MB Hepatic Function Panel  Imaging: Imaging results have been reviewed; Nuc Med Pharm ST pending today  Cardiac Studies:  TELE: NSR  Myoview 1/4: small, mild reversal fusion defect in the inferolateral wall cannot rule out ischemia. EF 44% with global hypokinesis. INTERMEDIATE RISK  Assessment/Plan:  Principal Problem:   NSTEMI (non-ST elevated myocardial infarction) Active Problems:   Contrast media allergy   DM (diabetes mellitus), type 2, uncontrolled, with renal complications   Hyperlipidemia   Obstructive sleep apnea   Coronary artery disease due to lipid rich plaque; Non-obstructive CAD   Acute renal failure superimposed on stage 3 chronic kidney disease   Hypertension associated with diabetes   Acute on chronic combined systolic and diastolic congestive heart failure, NYHA class 3   Hyperkalemia   Sinus bradycardia   Essential hypertension   Abnormal nuclear stress test  Renal Fxn has remained stable despite increased potassium. On Plavix for NSTEMI along with IV Heparin.    Abnormal Myoview with recent non-STEMI.  Plan was for coronary angiography today. -Was postponed from yesterday due to scheduling overbooking. We will delay until we have repeat potassium levels. Thankfully creatinine remained stable, close to baseline.  For contrast hypersensitivity Was dosed with prednisone last night. Has on-call Solu-Medrol and Benadryl precath ordered and an additional dose of prednisone for this afternoon 6 hours post cath.  For heart catheterization tono LV Gram & consider staged PCI. -- patient has asked to consider left radial versus left femoral access.  Has reported HF - but no echo  since 2013. EF 44% by Myoview. We'll check 2-D echocardiogram.   BP Remains elevated - has only received one dose of Bystolic for BP -- I'm reluctant to titrate further until we see the effect on heart rate.   Has reported h/o of Statin intolerance - on Zetia alone (can defer to Primary Cardiologist)  Glycemic control borderline with CBGs in ~200 range -- once taking PO, will restart PM dose of 70/30 -- use SSI for now; once no longer taking by mouth we will need to restart her second afternoon dose of 7030. I suspect some of her hypoglycemia this morning is related to receiving steroids for her contrast hypersensitivity.    LOS: 5 days    Brayden Betters W 06/22/2014, 12:57 PM

## 2014-06-22 NOTE — CV Procedure (Signed)
      Cardiac Catheterization Operative Report  [name] [id number] 1/6/20164:19 PM Renato Shin, MD  Procedure Performed:  1. Left Heart Catheterization 2. Selective Coronary Angiography  Operator: Lauree Chandler, MD  Arterial access site:  Left radial artery.   Indication: 81 yo female with known CAD admitted with hypertensive crisis, elevated troponin with flat trend. Stress myoview with possible inferolateral ischemia. Pt with renal insufficiency. Plans today for diagnostic only cath to define CAD.                                    Procedure Details: The risks, benefits, complications, treatment options, and expected outcomes were discussed with the patient. The patient and/or family concurred with the proposed plan, giving informed consent. The patient was brought to the cath lab after IV hydration was begun and oral premedication was given. The patient was further sedated with Versed and Fentanyl. The left wrist was assessed with a modified Allens test which was positive. The left wrist was prepped and draped in a sterile fashion. 1% lidocaine was used for local anesthesia. Using the modified Seldinger access technique, a 5 French sheath was placed in the left radial artery. 3 mg Verapamil was given through the sheath. 5000 units IV heparin was given. Standard diagnostic catheters were used to perform selective coronary angiography. The JR4 catheter was used to cross the aortic valve and LV pressures were measured. The sheath was removed from the right radial artery and a Terumo hemostasis band was applied at the arteriotomy site on the right wrist.   There were no immediate complications. The patient was taken to the recovery area in stable condition.   Hemodynamic Findings: Central aortic pressure: 144/56 Left ventricular pressure: 141/13/23  Angiographic Findings:  Left main: No obstructive disease.   Left Anterior Descending Artery:  Large caliber vessel  that courses to the apex. The proximal and mid vessel is calcified. The proximal vessel has diffuse 20% stenosis. The mid vessel has diffuse 30% stenosis. The distal vessel has mild plaque. The small to moderate caliber diagonal branch has ostial 30% stenosis.   Circumflex Artery: Large caliber vessel with large caliber obtuse marginal branch. The distal AV groove Circumflex has a 70% stenosis just after the takeoff of OM1. (this has progressed from the prior cath).   Right Coronary Artery: Large dominant vessel with diffuse 50% proximal to mid stenosis. Appears unchanged from last cath.   Left Ventricular Angiogram: Deferred.   45cc contrast used.   Impression: 1. Moderate non-obstructive disease in the RCA and LAD 2. Moderately severe distal Circumflex artery stenosis  Recommendations: I would recommend medical management of her distal Circumflex stenosis. The lesion is just past the takeoff of the large caliber OM branch and PCI would potentially compromise flow into the larger OM branch. Given her renal insufficiency, will hydrate aggressively post-cath. If she continues to have angina despite optimal medical therapy, could consider PCI of the distal AV groove Circumflex stenosis.        Complications:  None. The patient tolerated the procedure well.

## 2014-06-22 NOTE — Progress Notes (Signed)
     Paged because patient has repeat K level of 6.6. ECG with no acute changes. I have placed order set for moderate hyperkalemia with dextrose/insulin, sodium bicarb and kayexalate. Will repeat K labs in a 3-4 hours. Dr Ellyn Hack to see first on rounds today.   Angelena Form PA-C  [facility]

## 2014-06-22 NOTE — Interval H&P Note (Signed)
History and Physical Interval Note:  06/22/2014 3:23 PM  [name]  has presented today for cardiac cath with the diagnosis of chest pain, CAD.  The various methods of treatment have been discussed with the patient and family. After consideration of risks, benefits and other options for treatment, the patient has consented to  Procedure(s): LEFT HEART CATHETERIZATION WITH CORONARY ANGIOGRAM (N/A) as a surgical intervention .  The patient's history has been reviewed, patient examined, no change in status, stable for surgery.  I have reviewed the patient's chart and labs.  Questions were answered to the patient's satisfaction.    Cath Lab Visit (complete for each Cath Lab visit)  Clinical Evaluation Leading to the Procedure:   ACS: No.  Non-ACS:    Anginal Classification: CCS III  Anti-ischemic medical therapy: Maximal Therapy (2 or more classes of medications)  Non-Invasive Test Results: No non-invasive testing performed  Prior CABG: No previous CABG         MCALHANY,CHRISTOPHER

## 2014-06-22 NOTE — Progress Notes (Signed)
Inpatient Diabetes Program Recommendations  AACE/ADA: New Consensus Statement on Inpatient Glycemic Control (2013)  Target Ranges:  Prepandial:   less than 140 mg/dL      Peak postprandial:   less than 180 mg/dL (1-2 hours)      Critically ill patients:  140 - 180 mg/dL  Results for [name], [name] (MRN [id number]) as of 06/22/2014 12:40  Ref. Range 06/20/2014 21:33 06/21/2014 07:45 06/21/2014 11:54 06/21/2014 17:07 06/21/2014 21:51 06/22/2014 08:25 06/22/2014 09:30  Glucose-Capillary Latest Range: 70-99 mg/dL 336 (H) 239 (H) 337 (H) 217 (H) 238 (H) 363 (H) 463 (H)   Inpatient Diabetes Program Recommendations Insulin - Basal: add Lantus 15 units while NPO  May need Novolog RESISTANT scale during steroid therapy. Note: Consider adding long-acting Lantus while NPO bc patient has not received any 70/30 recently bc of NPO status.  Premix 70/30 is not recommended when NPO. Will follow. Thank you  Raoul Pitch BSN, RN,CDE Inpatient Diabetes Coordinator [phone] (team pager)

## 2014-06-22 NOTE — Progress Notes (Addendum)
NUTRITION FOLLOW UP  Intervention:   -Continue with Glucerna Shake po TID, each supplement provides 220 kcal and 10 grams of protein, one diet is advanced  -RD to follow for plan of care  Nutrition Dx:   Inadequate oral intake related to poor appetite as evidenced by pt's report and 15-25% meal completion; progressing  Goal:   Pt to meet >/= 90% of their estimated nutrition needs; goal met  Monitor:   PO intake, weight trend, labs  Assessment:   81 yo AAF with a long history of diastolic CHF, nonobstructive CAD, DM2, HTN, HL, sleep apnea and CKD. Admitted to [facility] 11/2834 with a/c diastolic CHF and with elevated enzymes that were 2/2 CHF + CKD.  Pt was drowsy and receiving breathing treatment at time of visit. Pt is NPO; awaiting cardiac cath today.  She was previously on a carb modified diet. Intake has improved since admission. Meal completion noted at 75-100%. Pt also receiving Glucerna Shakes TID, which she is accepting well per [facility].  Noted an 11# (5%) wt loss x 5 days, which could be due to fluid loss. Orders note pt received a IV dose of lasix earlier this morning.  Labs reviewed. K: 6.6, BUN/Creat: 34/1.73. Glucose: 398. CBGS: [id number].  Height: Ht Readings from Last 1 Encounters:  06/17/14 5' 6" (1.676 m)    Weight Status:   Wt Readings from Last 1 Encounters:  06/21/14 207 lb (93.895 kg)   06/17/14 218 lb (98.884 kg)   Re-estimated needs:  Kcal: 1700-1900 Protein: 75-85 grams Fluid: 1.7-1.9 L  Skin: WDL  Diet Order: Diet NPO time specified Except for: Sips with Meds   Intake/Output Summary (Last 24 hours) at 06/22/14 0951 Last data filed at 06/22/14 0900  Gross per 24 hour  Intake 2173.26 ml  Output      0 ml  Net 2173.26 ml    Last BM: 06/21/14   Labs:   Recent Labs Lab 06/17/14 1946  06/21/14 0245 06/22/14 0409 06/22/14 0555  NA 138  < > 138 135 135  K 3.7  < > 4.9 6.6* 6.6*  CL 102  < > 99 101 102  CO2 27  < > _0 BUN 14  < > 31* 34*  34*  CREATININE 1.39*  < > 1.69* 1.77* 1.73*  CALCIUM 9.4  < > 8.9 9.1 9.0  MG 1.5  --   --   --   --   GLUCOSE 200*  < > 304* 373* 398*  < > = values in this interval not displayed.  CBG (last 3)   Recent Labs  06/21/14 2151 06/22/14 0825 06/22/14 0930  GLUCAP 238* 363* 463*    Scheduled Meds: . amLODipine  10 mg Oral Daily  . antiseptic oral rinse  7 mL Mouth Rinse BID  . aspirin  81 mg Oral Daily  . calcitRIOL  0.25 mcg Oral Daily  . clopidogrel  75 mg Oral Daily  . diphenhydrAMINE  25 mg Intravenous On Call  . donepezil  5 mg Oral QHS  . ezetimibe  10 mg Oral q morning - 10a  . feeding supplement (GLUCERNA SHAKE)  237 mL Oral TID BM  . gabapentin  300 mg Oral QHS  . hydrALAZINE  100 mg Oral TID  . insulin aspart  0-15 Units Subcutaneous 6 times per day  . insulin aspart protamine- aspart  18 Units Subcutaneous Q breakfast  . latanoprost  1 drop Both Eyes QHS  . methylPREDNISolone (  SOLU-MEDROL) injection  125 mg Intravenous Once  . mometasone-formoterol  2 puff Inhalation BID  . mupirocin ointment  1 application Nasal BID  . nebivolol  5 mg Oral Daily  . pantoprazole  40 mg Oral BID  . predniSONE  40 mg Oral Once  . sertraline  100 mg Oral Daily  .  sodium bicarbonate  infusion 1000 mL   Intravenous Once  . sodium chloride  3 mL Intravenous Q12H    Continuous Infusions: . sodium chloride Stopped (06/22/14 0554)  . sodium chloride 75 mL/hr at 06/22/14 0554  . heparin 1,300 Units/hr (06/22/14 0557)  . nitroGLYCERIN 60 mcg/min (06/22/14 0900)    [name], RD, LDN, CDE Pager: [phone] After hours Pager: [phone]

## 2014-06-22 NOTE — Progress Notes (Signed)
CRITICAL VALUE ALERT  Critical value received:  Potassium 6.6  Date of notification:  06/22/13  Time of notification:  0700  Critical value read back:Yes.    Nurse who received alert:  Arma Heading  MD notified (1st page):  Roberts  Time of first page: 0706  MD notified (2nd page):  Time of second page:  Responding MD:  Angelena Form  Time MD responded:  [phone]

## 2014-06-22 NOTE — Progress Notes (Signed)
ANTICOAGULATION CONSULT NOTE - Follow Up Consult  Pharmacy Consult for Heparin Indication: chest pain/ACS  Allergies  Allergen Reactions  . Iohexol      Code: RASH, Desc: [place] ON PT'S CHART ALLERGIC TO IV DYE 09/04/07/RM, Onset Date: [id number]     Patient Measurements: Height: 5\' 6"  (167.6 cm) Weight: 207 lb (93.895 kg) IBW/kg (Calculated) : 59.3 Heparin Dosing Weight: ~81.5kg  Vital Signs: Temp: 97.7 F (36.5 C) (01/06 0355) Temp Source: Oral (01/06 0355) BP: 159/59 mmHg (01/06 0600) Pulse Rate: 56 (01/06 0600)  Labs:  Recent Labs  06/20/14 0325 06/21/14 0245 06/22/14 0219 06/22/14 0409 06/22/14 0555  HGB 13.0 12.5 13.0  --   --   HCT 40.7 40.2 41.3  --   --   PLT 228 279 288  --   --   HEPARINUNFRC 0.25* 0.29* 0.51  --   --   CREATININE 2.07* 1.69*  --  1.77* 1.73*    Estimated Creatinine Clearance: 28.9 mL/min (by C-G formula based on Cr of 1.73).   Medications:  . sodium chloride 1 mL/kg/hr (06/21/14 2000)  . sodium chloride 75 mL/hr at 06/22/14 0554  . heparin 1,300 Units/hr (06/22/14 0557)  . nitroGLYCERIN 50 mcg/min (06/22/14 0656)    Assessment: 84 yof continues on heparin for NSTEMI. Myoview yesterday showed intermediate risk. Planning for cath today.  Heparin level is therapeutic on 1300 units/hr.  CBC remains stable. No bleeding reported.  Goal of Therapy:  Heparin level 0.3-0.7 units/ml Monitor platelets by anticoagulation protocol: Yes   Plan:  1) Continue IV heparin at current rate. 2) F/u plans for anticoagulation after cath today.  Uvaldo Rising, BCPS  Clinical Pharmacist Pager [phone]  06/22/2014 7:43 AM

## 2014-06-22 NOTE — Progress Notes (Signed)
81 year old woman with known coronary disease status post prior PCI admitted for dyspnea with troponin elevation suggestive of non-STEMI with acute and chronic heart failure. Stabilized medically. Myoview scan performed January 4 - with possible inferolateral hypo-perfusion possible ischemia. Read as intermediate risk.  Subjective:  No further chest pain. Currently getting a breathing treatment, but denies any worsening dyspnea. Heart pressure slightly better control on IV nitroglycerin infusion after the addition of bystolic. Potassium this morning was 6.6, up from 4.9 yesterday. Potentially related to continuing to receive low-dose supplementation and not getting oral Lasix. Was given insulin and Kayexalate by the on-call team. I have also written for IV Lasix to be given with her precath hydration this morning.  Objective:  Vital Signs in the last 24 hours: Temp:  [97.1 F (36.2 C)-98.5 F (36.9 C)] 97.1 F (36.2 C) (01/06 1223) Pulse Rate:  [52-79] 67 (01/06 1200) Resp:  [15-27] 23 (01/06 1200) BP: (115-183)/(41-131) 141/53 mmHg (01/06 1200) SpO2:  [93 %-100 %] 98 % (01/06 1200)  Intake/Output from previous day: 01/05 0701 - 01/06 0700 In: 2394.6 [P.O.:350; I.V.:2044.6] Out: -  Intake/Output from this shift: Total I/O In: 852.9 [I.V.:852.9] Out: -   Physical Exam: General appearance: alert, cooperative, appears stated age, no distress, moderately obese and pleasant mood & affect Neck: no adenopathy, no carotid bruit and no JVD Lungs: clear to auscultation bilaterally and normal percussion bilaterally - intermittent bibasilar wheezing, but no rales Heart: RRR S1, S2 normal, no murmur, click, rub or gallop Abdomen: soft, non-tender; bowel sounds normal; no masses,  no organomegaly Extremities: extremities normal, atraumatic, no cyanosis or edema and L BKA Pulses: R foot pulse intact Neurologic: Grossly normal  Lab Results:  Recent Labs  06/21/14 0245 06/22/14 0219    WBC 7.5 6.7  HGB 12.5 13.0  PLT 279 288    Recent Labs  06/22/14 0409 06/22/14 0555  NA 135 135  K 6.6* 6.6*  CL 101 102  CO2 27 23  GLUCOSE 373* 398*  BUN 34* 34*  CREATININE 1.77* 1.73*   No results for input(s): TROPONINI in the last 72 hours.  Invalid input(s): CK, MB Hepatic Function Panel  Imaging: Imaging results have been reviewed; Nuc Med Pharm ST pending today  Cardiac Studies:  TELE: NSR  Myoview 1/4: small, mild reversal fusion defect in the inferolateral wall cannot rule out ischemia. EF 44% with global hypokinesis. INTERMEDIATE RISK  Assessment/Plan:  Principal Problem:   NSTEMI (non-ST elevated myocardial infarction) Active Problems:   Contrast media allergy   DM (diabetes mellitus), type 2, uncontrolled, with renal complications   Hyperlipidemia   Obstructive sleep apnea   Coronary artery disease due to lipid rich plaque; Non-obstructive CAD   Acute renal failure superimposed on stage 3 chronic kidney disease   Hypertension associated with diabetes   Acute on chronic combined systolic and diastolic congestive heart failure, NYHA class 3   Hyperkalemia   Sinus bradycardia   Essential hypertension   Abnormal nuclear stress test  Renal Fxn has remained stable despite increased potassium. On Plavix for NSTEMI along with IV Heparin.    Abnormal Myoview with recent non-STEMI.  Plan was for coronary angiography today. -Was postponed from yesterday due to scheduling overbooking. We will delay until we have repeat potassium levels. Thankfully creatinine remained stable, close to baseline.  For contrast hypersensitivity Was dosed with prednisone last night. Has on-call Solu-Medrol and Benadryl precath ordered and an additional dose of prednisone for this afternoon 6 hours post cath.  For heart catheterization tono LV Gram & consider staged PCI. -- patient has asked to consider left radial versus left femoral access.  Has reported HF - but no echo  since 2013. EF 44% by Myoview. We'll check 2-D echocardiogram.   BP Remains elevated - has only received one dose of Bystolic for BP -- I'm reluctant to titrate further until we see the effect on heart rate.   Has reported h/o of Statin intolerance - on Zetia alone (can defer to Primary Cardiologist)  Glycemic control borderline with CBGs in ~200 range -- once taking PO, will restart PM dose of 70/30 -- use SSI for now; once no longer taking by mouth we will need to restart her second afternoon dose of 7030. I suspect some of her hypoglycemia this morning is related to receiving steroids for her contrast hypersensitivity.    LOS: 5 days    HARDING, DAVID W 06/22/2014, 12:57 PM

## 2014-06-23 DIAGNOSIS — R931 Abnormal findings on diagnostic imaging of heart and coronary circulation: Secondary | ICD-10-CM

## 2014-06-23 DIAGNOSIS — I517 Cardiomegaly: Secondary | ICD-10-CM

## 2014-06-23 DIAGNOSIS — E875 Hyperkalemia: Secondary | ICD-10-CM

## 2014-06-23 LAB — CBC
HEMATOCRIT: 37 % (ref 36.0–46.0)
Hemoglobin: 11.6 g/dL — ABNORMAL LOW (ref 12.0–15.0)
MCH: 27.5 pg (ref 26.0–34.0)
MCHC: 31.4 g/dL (ref 30.0–36.0)
MCV: 87.7 fL (ref 78.0–100.0)
Platelets: 276 10*3/uL (ref 150–400)
RBC: 4.22 MIL/uL (ref 3.87–5.11)
RDW: 16.2 % — ABNORMAL HIGH (ref 11.5–15.5)
WBC: 5.9 10*3/uL (ref 4.0–10.5)

## 2014-06-23 LAB — GLUCOSE, CAPILLARY
GLUCOSE-CAPILLARY: 376 mg/dL — AB (ref 70–99)
GLUCOSE-CAPILLARY: 92 mg/dL (ref 70–99)
Glucose-Capillary: 434 mg/dL — ABNORMAL HIGH (ref 70–99)
Glucose-Capillary: 239 mg/dL — ABNORMAL HIGH (ref 70–99)

## 2014-06-23 LAB — BASIC METABOLIC PANEL
ANION GAP: 10 (ref 5–15)
BUN: 42 mg/dL — ABNORMAL HIGH (ref 6–23)
CHLORIDE: 97 meq/L (ref 96–112)
CO2: 28 mmol/L (ref 19–32)
CREATININE: 1.89 mg/dL — AB (ref 0.50–1.10)
Calcium: 8.9 mg/dL (ref 8.4–10.5)
GFR calc non Af Amer: 24 mL/min — ABNORMAL LOW (ref >90)
GFR, EST AFRICAN AMERICAN: 27 mL/min — AB (ref >90)
Glucose, Bld: 476 mg/dL — ABNORMAL HIGH (ref 70–99)
Potassium: 4.6 mmol/L (ref 3.5–5.1)
Sodium: 135 mmol/L (ref 135–145)

## 2014-06-23 MED ORDER — INSULIN ASPART 100 UNIT/ML ~~LOC~~ SOLN
0.0000 [IU] | Freq: Three times a day (TID) | SUBCUTANEOUS | Status: DC
  Administered 2014-06-23: 20 [IU] via SUBCUTANEOUS
  Administered 2014-06-23: 7 [IU] via SUBCUTANEOUS
  Administered 2014-06-24: 4 [IU] via SUBCUTANEOUS
  Administered 2014-06-24: 3 [IU] via SUBCUTANEOUS
  Administered 2014-06-24 – 2014-06-25 (×2): 4 [IU] via SUBCUTANEOUS
  Administered 2014-06-25: 3 [IU] via SUBCUTANEOUS
  Administered 2014-06-26 (×3): 4 [IU] via SUBCUTANEOUS
  Administered 2014-06-27 (×2): 3 [IU] via SUBCUTANEOUS

## 2014-06-23 MED ORDER — LISINOPRIL 5 MG PO TABS
5.0000 mg | ORAL_TABLET | Freq: Every day | ORAL | Status: DC
  Administered 2014-06-23: 5 mg via ORAL
  Filled 2014-06-23 (×2): qty 1

## 2014-06-23 MED ORDER — FUROSEMIDE 80 MG PO TABS
80.0000 mg | ORAL_TABLET | Freq: Every day | ORAL | Status: DC
  Administered 2014-06-24 – 2014-06-25 (×2): 80 mg via ORAL
  Filled 2014-06-23 (×3): qty 1

## 2014-06-23 MED ORDER — FUROSEMIDE 80 MG PO TABS: 80.0000 mg | ORAL_TABLET | Freq: Every day | ORAL | Status: DC

## 2014-06-23 MED ORDER — INSULIN ASPART PROT & ASPART (70-30 MIX) 100 UNIT/ML ~~LOC~~ SUSP
10.0000 [IU] | Freq: Every day | SUBCUTANEOUS | Status: DC
  Administered 2014-06-23 – 2014-06-26 (×4): 10 [IU] via SUBCUTANEOUS

## 2014-06-23 MED ORDER — PERFLUTREN LIPID MICROSPHERE
1.0000 mL | INTRAVENOUS | Status: AC | PRN
  Administered 2014-06-23: 2 mL via INTRAVENOUS
  Filled 2014-06-23: qty 10

## 2014-06-23 MED ORDER — FUROSEMIDE 10 MG/ML IJ SOLN
80.0000 mg | Freq: Once | INTRAMUSCULAR | Status: AC
  Administered 2014-06-23: 80 mg via INTRAVENOUS
  Filled 2014-06-23: qty 8

## 2014-06-23 MED ORDER — ISOSORBIDE MONONITRATE ER 60 MG PO TB24
60.0000 mg | ORAL_TABLET | Freq: Every day | ORAL | Status: DC
  Administered 2014-06-23 – 2014-06-27 (×5): 60 mg via ORAL
  Filled 2014-06-23 (×5): qty 1

## 2014-06-23 MED ORDER — HYDRALAZINE HCL 20 MG/ML IJ SOLN
10.0000 mg | Freq: Once | INTRAMUSCULAR | Status: AC
  Administered 2014-06-23: 10 mg via INTRAVENOUS
  Filled 2014-06-23: qty 1

## 2014-06-23 MED ORDER — PERFLUTREN LIPID MICROSPHERE
INTRAVENOUS | Status: AC
  Filled 2014-06-23: qty 10

## 2014-06-23 MED ORDER — INSULIN ASPART 100 UNIT/ML ~~LOC~~ SOLN
0.0000 [IU] | Freq: Three times a day (TID) | SUBCUTANEOUS | Status: DC
  Administered 2014-06-23: 20 [IU] via SUBCUTANEOUS

## 2014-06-23 MED ORDER — NEBIVOLOL HCL 10 MG PO TABS
10.0000 mg | ORAL_TABLET | Freq: Every day | ORAL | Status: DC
  Administered 2014-06-23 – 2014-06-24 (×2): 10 mg via ORAL
  Filled 2014-06-23 (×3): qty 1

## 2014-06-23 NOTE — Progress Notes (Signed)
  Echocardiogram 2D Echocardiogram with Definity has been performed.  Diamond Nickel 06/23/2014, 4:09 PM

## 2014-06-23 NOTE — Progress Notes (Signed)
81 year old woman with known coronary disease status post prior PCI admitted for dyspnea with troponin elevation suggestive of non-STEMI with acute and chronic heart failure. Stabilized medically. Myoview scan performed January 4 - with possible inferolateral hypo-perfusion possible ischemia. Read as intermediate risk.  Subjective:  No further chest pain.  Cardiac catheterization yesterday revealed stable moderate RCA disease with slight progression of disease in the ostium of the AV groove circumflex after large marginal branch. I reviewed these images with Dr. Angelena Form and we both agreed that this is not very amenable to PCI and would prefer medical therapy provided she does not have a intractable angina.  Objective:  Vital Signs in the last 24 hours: Temp:  [97.1 F (36.2 C)-98.4 F (36.9 C)] 97.3 F (36.3 C) (01/07 0800) Pulse Rate:  [51-79] 69 (01/07 1000) Resp:  [10-28] 17 (01/07 1000) BP: (110-202)/(38-86) 156/45 mmHg (01/07 1000) SpO2:  [94 %-100 %] 100 % (01/07 1000)  Intake/Output from previous day: 01/06 0701 - 01/07 0700 In: 1970.2 [P.O.:240; I.V.:1730.2] Out: -  Intake/Output from this shift: Total I/O In: 435.8 [P.O.:360; I.V.:75.8] Out: 100 [Urine:100]  Physical Exam: General appearance: alert, cooperative, appears stated age, no distress, moderately obese and pleasant mood & affect Neck: no adenopathy, no carotid bruit and no JVD Lungs: CTA B. Mild interstitial sounds but otherwise normal breathing. Heart: RRR S1, S2 normal, no murmur, click, rub or gallop Abdomen: soft, non-tender; bowel sounds normal; no masses,  no organomegaly Extremities: extremities normal, atraumatic, no cyanosis or edema and L BKA Pulses: R foot pulse intact; left radial cath site intact with good pulse. Neurologic: Grossly normal  Lab Results:  Recent Labs  06/22/14 0219 06/23/14 0244  WBC 6.7 5.9  HGB 13.0 11.6*  PLT 288 276    Recent Labs  06/22/14 1420 06/23/14 0244    NA 137 135  K 4.5 4.6  CL 99 97  CO2 27 28  GLUCOSE 392* 476*  BUN 36* 42*  CREATININE 1.93* 1.89*   No results for input(s): TROPONINI in the last 72 hours.  Invalid input(s): CK, MB Hepatic Function Panel  Imaging:  No new images  Cardiac Studies:  TELE: NSR  Myoview 1/4: small, mild reversal fusion defect in the inferolateral wall cannot rule out ischemia. EF 44% with global hypokinesis. INTERMEDIATE RISK  Cardiac catheterization 1/6: Imaging reviewed: Discussed with interventional cardiologist performing procedure  Distal AV groove circumflex following large OM1 ~70% (slight progression from last cath) - not easily accessible from PCI standpoint as it is close to the large marginal branch  Moderate 50% proximal RCA stenosis similar to previous  Assessment/Plan:  Principal Problem:   NSTEMI (non-ST elevated myocardial infarction) Active Problems:   Contrast media allergy   DM (diabetes mellitus), type 2, uncontrolled, with renal complications   Hyperlipidemia   Obstructive sleep apnea   Coronary artery disease due to lipid rich plaque; Non-obstructive CAD   Acute renal failure superimposed on stage 3 chronic kidney disease   Hypertension associated with diabetes   Acute on chronic combined systolic and diastolic congestive heart failure, NYHA class 3   Hyperkalemia   Sinus bradycardia   Essential hypertension   Abnormal nuclear stress test  Abnormal Myoview with recent Non-STEMI/moderate CAD by cath: The location of possible ischemia on the stress test does correlate with the lesion noted on cath, however this is not very PCI amenable lesion and is likely better suited for medical therapy.  Continue aspirin and Plavix - consider statin as outpatient  No problems with contrast hypersensitivity. The only issue was elevated blood sugar levels.  We'll convert from IV nitroglycerin to Imdur. Titrating up beta blocker. Can still consider Ranexa.  Renal Fxn has  remained stable today post-cath. Potassium levels normalized.  Although not measured, urine output seems brisk. Will restart by mouth Lasix tomorrow after IV dose today even significant post catheterization.  Has reported HF - but no echo since 2013. EF 44% by Myoview. Echocardiogram pending.   Restart home dose by mouth Lasix tomorrow.  I have switched beta blocker from labetalol to Bystolic  Added low-dose lisinopril today  Is already on hydralazine plus nitrates for additional afterload reduction.  In light of recent hyperkalemia and renal insufficiency, would not use spironolactone  Difficult to control hypertension:   Would like to wean off nitroglycerin today and switch back to Imdur.   Will titrate up Bystolic and monitor heart rate.   Have added 5 mg lisinopril  As renal function is improved  Continue hydralazine   Has reported h/o of Statin intolerance - on Zetia alone (can defer to Primary Cardiologist) Type 2 Diabetes Mellitus on Insulin:   We'll restart afternoon 70/30 dose.   Changed from moderate to resistant scale for sliding scale.   Glycemic control has not been as good as expected, likely related to steroids being administered and not having afternoon doses of 7030 for nothing by mouth p    Deconditioning and gait instability related to indentation. She has been in the bed for quite some time. I have consult to physical therapy to work with the patient today.   Plan is to transfer to telemetry today with possible discharge as early as tomorrow/Saturday depending upon renal function.    LOS: 6 days    HARDING, DAVID W 06/23/2014, 12:21 PM

## 2014-06-23 NOTE — Evaluation (Signed)
Physical Therapy Evaluation Patient Details Name: [name] MRN: [id number] DOB: 07-27-1929 Today's Date: 06/23/2014   History of Present Illness  Pt adm with NSTEMI. PMH- lt BKA, DM, heart failure, HTN  Clinical Impression  Pt admitted with above diagnosis. Pt currently with functional limitations due to the deficits listed below (see PT Problem List).  Pt will benefit from skilled PT to increase their independence and safety with mobility to allow discharge to the venue listed below.       Follow Up Recommendations Home health PT;Supervision for mobility/OOB    Equipment Recommendations  None recommended by PT    Recommendations for Other Services       Precautions / Restrictions Precautions Precautions: Fall Restrictions Weight Bearing Restrictions: No LLE Weight Bearing: Non weight bearing      Mobility  Bed Mobility Overal bed mobility: Needs Assistance Bed Mobility: Supine to Sit     Supine to sit: Mod assist;HOB elevated     General bed mobility comments: Assist to bring trunk up  Transfers Overall transfer level: Needs assistance Equipment used: Rolling walker (2 wheeled) Transfers: Sit to/from [facility] Sit to Stand: +2 physical assistance;Min assist;Mod assist Stand pivot transfers: +2 physical assistance;Min assist       General transfer comment: Assist to bring lt prosthetic foot back underneath her prior to stand. Assist to bring hips up.  Ambulation/Gait Ambulation/Gait assistance: Min assist;+2 safety/equipment Ambulation Distance (Feet): 12 Feet Assistive device: Rolling walker (2 wheeled) Gait Pattern/deviations: Step-through pattern;Decreased step length - right;Decreased step length - left;Trunk flexed   Gait velocity interpretation: Below normal speed for age/gender General Gait Details: Verbal cues to stand more erect and look up.   Stairs            Wheelchair Mobility    Modified Rankin (Stroke  Patients Only)       Balance Overall balance assessment: Needs assistance Sitting-balance support: No upper extremity supported;Feet supported Sitting balance-Leahy Scale: Fair     Standing balance support: Bilateral upper extremity supported Standing balance-Leahy Scale: Poor Standing balance comment: support of walker and min A                             Pertinent Vitals/Pain Pain Assessment: No/denies pain    Home Living Family/patient expects to be discharged to:: Private residence Living Arrangements: Spouse/significant other Available Help at Discharge: Family;Available 24 hours/day Type of Home: House Home Access: Ramped entrance     Home Layout: One level Home Equipment: Walker - 2 wheels;Walker - 4 wheels;Walker - standard;Wheelchair - manual;Shower seat - built in;Shower seat      Prior Function Level of Independence: Needs assistance   Gait / Transfers Assistance Needed: walked with RW and L prosthesis independently,   ADL's / Homemaking Assistance Needed: husband assists with bathing and dressing        Hand Dominance        Extremity/Trunk Assessment   Upper Extremity Assessment: Generalized weakness           Lower Extremity Assessment: Generalized weakness         Communication      Cognition Arousal/Alertness: Awake/alert Behavior During Therapy: WFL for tasks assessed/performed Overall Cognitive Status: Within Functional Limits for tasks assessed                      General Comments      Exercises  Assessment/Plan    PT Assessment Patient needs continued PT services  PT Diagnosis Difficulty walking;Generalized weakness   PT Problem List Decreased strength;Decreased activity tolerance;Decreased balance;Decreased mobility  PT Treatment Interventions DME instruction;Balance training;Gait training;Functional mobility training;Therapeutic activities;Therapeutic exercise;Patient/family education   PT  Goals (Current goals can be found in the Care Plan section) Acute Rehab PT Goals Patient Stated Goal: Return home PT Goal Formulation: With patient/family Time For Goal Achievement: 06/30/14 Potential to Achieve Goals: Good    Frequency Min 3X/week   Barriers to discharge        Co-evaluation               End of Session Equipment Utilized During Treatment: Gait belt Activity Tolerance: Patient tolerated treatment well Patient left: in chair;with call bell/phone within reach;with family/visitor present Nurse Communication: Mobility status         Time: 7903-8333 PT Time Calculation (min) (ACUTE ONLY): 34 min   Charges:   PT Evaluation $Initial PT Evaluation Tier I: 1 Procedure PT Treatments $Gait Training: 8-22 mins   PT G Codes:        Tifanie Gardiner 2014/07/17, 3:37 PM  [facility] PT [phone]

## 2014-06-24 ENCOUNTER — Ambulatory Visit: Payer: Medicare Other | Admitting: Cardiology

## 2014-06-24 LAB — BASIC METABOLIC PANEL
Anion gap: 14 (ref 5–15)
BUN: 58 mg/dL — AB (ref 6–23)
CALCIUM: 9 mg/dL (ref 8.4–10.5)
CHLORIDE: 100 meq/L (ref 96–112)
CO2: 25 mmol/L (ref 19–32)
Creatinine, Ser: 2.13 mg/dL — ABNORMAL HIGH (ref 0.50–1.10)
GFR calc Af Amer: 24 mL/min — ABNORMAL LOW (ref >90)
GFR, EST NON AFRICAN AMERICAN: 20 mL/min — AB (ref >90)
GLUCOSE: 188 mg/dL — AB (ref 70–99)
POTASSIUM: 3.5 mmol/L (ref 3.5–5.1)
Sodium: 139 mmol/L (ref 135–145)

## 2014-06-24 LAB — CBC
HCT: 38.6 % (ref 36.0–46.0)
Hemoglobin: 12.1 g/dL (ref 12.0–15.0)
MCH: 27.3 pg (ref 26.0–34.0)
MCHC: 31.3 g/dL (ref 30.0–36.0)
MCV: 87.1 fL (ref 78.0–100.0)
Platelets: 263 10*3/uL (ref 150–400)
RBC: 4.43 MIL/uL (ref 3.87–5.11)
RDW: 16.3 % — AB (ref 11.5–15.5)
WBC: 8.3 10*3/uL (ref 4.0–10.5)

## 2014-06-24 LAB — GLUCOSE, CAPILLARY
Glucose-Capillary: 163 mg/dL — ABNORMAL HIGH (ref 70–99)
Glucose-Capillary: 154 mg/dL — ABNORMAL HIGH (ref 70–99)
Glucose-Capillary: 191 mg/dL — ABNORMAL HIGH (ref 70–99)
Glucose-Capillary: 151 mg/dL — ABNORMAL HIGH (ref 70–99)
Glucose-Capillary: 128 mg/dL — ABNORMAL HIGH (ref 70–99)

## 2014-06-24 NOTE — Progress Notes (Signed)
CARDIAC REHAB PHASE I   PRE:  Rate/Rhythm: 55 SB    BP: sitting 155/77    SaO2: 95-97 RA  MODE:  Ambulation: 56 ft in hall   POST:  Rate/Rhythm: 65     BP: sitting 144/79     SaO2: 96 RA  Assisted pt with clean up after BM, rested in recliner, then able to walk assist x2 (for standing). Used gait belt and RW. Pt able to walk in hall now that she has her tennis shoes. Tired after walk and to bed. Sts she was somewhat SOB but better than she has been. C/o back pain. Will continue to follow. [place], [place], ACSM 06/24/2014 2:17 PM

## 2014-06-24 NOTE — Progress Notes (Signed)
Medicare Important Message given? YES  (If response is "NO", the following Medicare IM given date fields will be blank)  Date Medicare IM given: 06/24/14 Medicare IM given by:  Dahlia Client [employer]

## 2014-06-24 NOTE — Progress Notes (Signed)
    Subjective:  Denies CP or dyspnea   Objective:  Filed Vitals:   06/23/14 1838 06/23/14 2100 06/24/14 0122 06/24/14 0500  BP: 141/49 139/45  136/51  Pulse: 66 61 55 65  Temp: 98.4 F (36.9 C) 99 F (37.2 C)  98.7 F (37.1 C)  TempSrc: Oral Oral  Oral  Resp: 20 19 22 19   Height:      Weight:      SpO2: 100% 95% 96% 98%    Intake/Output from previous day:  Intake/Output Summary (Last 24 hours) at 06/24/14 0738 Last data filed at 06/23/14 2000  Gross per 24 hour  Intake 912.75 ml  Output    100 ml  Net 812.75 ml    Physical Exam: Physical exam: Well-developed well-nourished in no acute distress.  Skin is warm and dry.  HEENT is normal.  Neck is supple. No thyromegaly.  Chest is clear to auscultation with normal expansion.  Cardiovascular exam is regular rate and rhythm.  Abdominal exam nontender or distended. No masses palpated. Extremities show no edema. neuro grossly intact    Lab Results: Basic Metabolic Panel:  Recent Labs  06/23/14 0244 06/24/14 0417  NA 135 139  K 4.6 3.5  CL 97 100  CO2 28 25  GLUCOSE 476* 188*  BUN 42* 58*  CREATININE 1.89* 2.13*  CALCIUM 8.9 9.0   CBC:  Recent Labs  06/23/14 0244 06/24/14 0417  WBC 5.9 8.3  HGB 11.6* 12.1  HCT 37.0 38.6  MCV 87.7 87.1  PLT 276 263     Assessment/Plan:  1 coronary artery disease-results of catheterization noted. Plan is for medical therapy. Continue aspirin and Plavix. Intolerant to statins. Continue zetia. Continue beta blocker, calcium blocker and nitrates. Can consider PCI of left circumflex if she has refractory angina. 2 acute on chronic renal failure-patient's creatinine is worse today. She is now 48 hours post catheterization and is most likely developing contrast nephropathy. Discontinue ACE inhibitor. Repeat BUN and creatinine tomorrow morning. 3 hypertension-continue present medications other than ACE inhibitor. 4 chronic diastolic congestive heart failure-continue  present dose of Lasix. 5 diabetes mellitus-continue present regimen. Follow CBG. If renal function stable could be discharged tomorrow. [name] 06/24/2014, 7:38 AM

## 2014-06-24 NOTE — Progress Notes (Signed)
Physical Therapy Treatment Patient Details Name: [name] MRN: [id number] DOB: 03-02-1930 Today's Date: 06/24/2014    History of Present Illness Pt adm with NSTEMI. PMH- lt BKA, DM, heart failure, HTN    PT Comments    Patient progressing towards physical therapy goals. Tolerated 10 feet of ambulation today with a rolling walker, fatigues easily. Transfers with min-mod assist depending on height of surface she is rising from. States she is not far from her baseline of physical functioning and her husband assists her as needed at home. Patient will continue to benefit from skilled physical therapy services to further improve independence with functional mobility.   Follow Up Recommendations  Home health PT;Supervision for mobility/OOB     Equipment Recommendations  None recommended by PT    Recommendations for Other Services       Precautions / Restrictions Precautions Precautions: Fall Required Braces or Orthoses: Other Brace/Splint Other Brace/Splint: LLE prosthesis Restrictions Weight Bearing Restrictions: No    Mobility  Bed Mobility Overal bed mobility: Needs Assistance Bed Mobility: Supine to Sit     Supine to sit: HOB elevated;Min assist     General bed mobility comments: Min assist for truncal support into seated position. VC for technique. Requires extra time. Some difficulty scooting forward  Transfers Overall transfer level: Needs assistance Equipment used: Rolling walker (2 wheeled) Transfers: Sit to/from Stand Sit to Stand: Mod assist Stand pivot transfers: Min assist       General transfer comment: Min assist for boost to stand from lowest bed setting, mod assist from chair with arm rests. VC for anterior weight shift, and placement of prosthetic limb for leverage.  Min assist for walker control with pivot transfer from chair to recliner.  Ambulation/Gait Ambulation/Gait assistance: Min guard Ambulation Distance (Feet): 10 Feet Assistive  device: Rolling walker (2 wheeled) Gait Pattern/deviations: Step-to pattern;Decreased step length - right;Decreased stance time - left;Decreased stride length;Trunk flexed Gait velocity: decreased   General Gait Details: Frequent VC for upright posture. Close guard for safety. No buckling noted. pt fatigues quickly.   Stairs            Wheelchair Mobility    Modified Rankin (Stroke Patients Only)       Balance                                    Cognition Arousal/Alertness: Awake/alert Behavior During Therapy: WFL for tasks assessed/performed Overall Cognitive Status: Within Functional Limits for tasks assessed                      Exercises General Exercises - Lower Extremity Ankle Circles/Pumps: AROM;Right;10 reps;Supine Long Arc Quad: Strengthening;Both;10 reps;Seated Straight Leg Raises: AAROM;Both;10 reps;Seated Hip Flexion/Marching: Strengthening;Both;10 reps;Seated    General Comments General comments (skin integrity, edema, etc.): Pt with urinary incontinence in bed at start of therapy session. Rn notified      Pertinent Vitals/Pain Pain Assessment: No/denies pain  HR in 70s SpO2  90-96% on room air    Home Living                      Prior Function            PT Goals (current goals can now be found in the care plan section) Acute Rehab PT Goals PT Goal Formulation: With patient/family Time For Goal Achievement: 06/30/14 Potential to Achieve Goals: Good Progress  towards PT goals: Progressing toward goals    Frequency  Min 3X/week    PT Plan Current plan remains appropriate    Co-evaluation             End of Session Equipment Utilized During Treatment: Gait belt Activity Tolerance: Patient tolerated treatment well Patient left: in bed;with call bell/phone within reach     Time: 0854-0926 PT Time Calculation (min) (ACUTE ONLY): 32 min  Charges:  $Therapeutic Exercise: 8-22 mins $Therapeutic  Activity: 8-22 mins                    G Codes:      [name] 2014-07-20, 9:56 AM [name], [place]

## 2014-06-25 LAB — BASIC METABOLIC PANEL
ANION GAP: 8 (ref 5–15)
BUN: 59 mg/dL — ABNORMAL HIGH (ref 6–23)
CHLORIDE: 101 meq/L (ref 96–112)
CO2: 29 mmol/L (ref 19–32)
CREATININE: 2.01 mg/dL — AB (ref 0.50–1.10)
Calcium: 8.6 mg/dL (ref 8.4–10.5)
GFR calc Af Amer: 25 mL/min — ABNORMAL LOW (ref >90)
GFR, EST NON AFRICAN AMERICAN: 22 mL/min — AB (ref >90)
Glucose, Bld: 139 mg/dL — ABNORMAL HIGH (ref 70–99)
Potassium: 4 mmol/L (ref 3.5–5.1)
Sodium: 138 mmol/L (ref 135–145)

## 2014-06-25 LAB — GLUCOSE, CAPILLARY
GLUCOSE-CAPILLARY: 76 mg/dL (ref 70–99)
GLUCOSE-CAPILLARY: 165 mg/dL — AB (ref 70–99)
Glucose-Capillary: 136 mg/dL — ABNORMAL HIGH (ref 70–99)
Glucose-Capillary: 93 mg/dL (ref 70–99)
Glucose-Capillary: 101 mg/dL — ABNORMAL HIGH (ref 70–99)

## 2014-06-25 MED ORDER — NEBIVOLOL HCL 5 MG PO TABS
5.0000 mg | ORAL_TABLET | Freq: Every day | ORAL | Status: DC
  Filled 2014-06-25 (×2): qty 1

## 2014-06-25 NOTE — Progress Notes (Signed)
Primary cardiologist: Dr. Ena Dawley  Seen for followup: CAD, NSTEMI  Subjective:    No active chest pain. Eating lunch. Has had mild shortness of breath.  Objective:   Temp:  [97.5 F (36.4 C)-98.5 F (36.9 C)] 97.6 F (36.4 C) (01/09 0521) Pulse Rate:  [42-49] 42 (01/09 0521) Resp:  [18-20] 20 (01/09 0521) BP: (128-145)/(51-58) 145/53 mmHg (01/09 0521) SpO2:  [94 %-100 %] 99 % (01/09 0521) Last BM Date: 06/24/14  Filed Weights   06/17/14 1242 06/21/14 0556  Weight: 218 lb (98.884 kg) 207 lb (93.895 kg)    Intake/Output Summary (Last 24 hours) at 06/25/14 1239 Last data filed at 06/25/14 0900  Gross per 24 hour  Intake    240 ml  Output      0 ml  Net    240 ml    Telemetry: Sinus rhythm and sinus bradycardia.  Exam:  General: Obese woman, appears comfortable at rest.  Lungs: Clear, nonlabored.  Cardiac: RRR without gallop.  Abdomen: NABS.  Extremities: No pitting edema.  Lab Results:  Basic Metabolic Panel:  Recent Labs Lab 06/23/14 0244 06/24/14 0417 06/25/14 0535  NA 135 139 138  K 4.6 3.5 4.0  CL 97 100 101  CO2 28 25 29   GLUCOSE 476* 188* 139*  BUN 42* 58* 59*  CREATININE 1.89* 2.13* 2.01*  CALCIUM 8.9 9.0 8.6    CBC:  Recent Labs Lab 06/22/14 0219 06/23/14 0244 06/24/14 0417  WBC 6.7 5.9 8.3  HGB 13.0 11.6* 12.1  HCT 41.3 37.0 38.6  MCV 91.4 87.7 87.1  PLT 288 276 263    BNP:  Recent Labs  11/15/13 1205 02/17/14 1202 06/08/14 1617  PROBNP 158.0* 768.0* 1249.0*    Echocardiogram 06/23/14: Study Conclusions  - Left ventricle: The cavity size was normal. There was moderate concentric hypertrophy. Systolic function was normal. The estimated ejection fraction was in the range of 50% to 55%. Wall motion was normal; there were no regional wall motion abnormalities. Doppler parameters are consistent with a reversible restrictive pattern, indicative of decreased left ventricular diastolic compliance  and/or increased left atrial pressure (grade 3 diastolic dysfunction). - Aortic valve: Cusp separation was reduced. - Mitral valve: Moderately calcified annulus. - Left atrium: The atrium was mildly dilated.   Medications:   Scheduled Medications: . amLODipine  10 mg Oral Daily  . antiseptic oral rinse  7 mL Mouth Rinse BID  . aspirin  81 mg Oral Daily  . calcitRIOL  0.25 mcg Oral Daily  . clopidogrel  75 mg Oral Daily  . donepezil  5 mg Oral QHS  . ezetimibe  10 mg Oral q morning - 10a  . feeding supplement (GLUCERNA SHAKE)  237 mL Oral TID BM  . furosemide  80 mg Oral Daily  . gabapentin  300 mg Oral QHS  . hydrALAZINE  100 mg Oral TID  . insulin aspart  0-20 Units Subcutaneous TID WC  . insulin aspart  0-5 Units Subcutaneous QHS  . insulin aspart protamine- aspart  10 Units Subcutaneous Q supper  . insulin aspart protamine- aspart  18 Units Subcutaneous Q breakfast  . isosorbide mononitrate  60 mg Oral Daily  . latanoprost  1 drop Both Eyes QHS  . methylPREDNISolone (SOLU-MEDROL) injection  125 mg Intravenous Once  . mometasone-formoterol  2 puff Inhalation BID  . nebivolol  10 mg Oral Daily  . pantoprazole  40 mg Oral BID  . sertraline  100 mg Oral Daily  PRN Medications:  acetaminophen, albuterol, bisacodyl, docusate sodium, HYDROcodone-acetaminophen, nitroGLYCERIN, ondansetron (ZOFRAN) IV, polyethylene glycol   Assessment:   1. NSTEMI, currently chest pain-free.  2. Moderate nonobstructive CAD within the LAD and RCA distribution with 70% distal circumflex stenosis that is being managed medically at this point. LVEF 50-55% by echocardiogram.  3. Acute on chronic renal insufficiency, peak creatinine 2.1, down to 2.0 today with hydration. Suspect component of contrast nephropathy.  4. Essential hypertension. Recent addition of ACE inhibitor was stopped due to renal insufficiency. She has been bradycardic with heart rates in the 40s after increase in by  Bystolic.  5. Chronic diastolic heart failure, on oral Lasix.  6. Type 2 diabetes mellitus.   Plan/Discussion:    Decrease by Bystolic to 5 mg daily. Otherwise continue aspirin, Plavix, Norvasc, hydralazine, Zetia, Lasix, and Imdur. Ambulate as tolerated. Anticipate discharge home tomorrow morning.   [name], M.D., F.A.C.C.

## 2014-06-25 NOTE — Progress Notes (Signed)
CARDIAC REHAB PHASE I   PRE:  Rate/Rhythm: 7 SB  BP:  Supine: 147/55 Sitting:   Standing:    SaO2: 96% RA  MODE:  Ambulation: 50 ft   POST:  Rate/Rhythm: 52  BP:  Supine:   Sitting: 134/63  Standing:    SaO2: 100% RA  1405-1450 Prior to ambulation, assisted patient putting on prosthetic leg and assisted to bedside commode. Pt ambulated approximately 50 feet with assist x2 and pushing rolling walker, gait slow, fairly steady. To chair after walk with legs elevated, call bell within reach.  Sol Passer, MS, ACSM CCEP

## 2014-06-26 LAB — BASIC METABOLIC PANEL
Anion gap: 11 (ref 5–15)
BUN: 61 mg/dL — AB (ref 6–23)
CHLORIDE: 100 meq/L (ref 96–112)
CO2: 30 mmol/L (ref 19–32)
CREATININE: 2.11 mg/dL — AB (ref 0.50–1.10)
Calcium: 9.1 mg/dL (ref 8.4–10.5)
GFR calc non Af Amer: 21 mL/min — ABNORMAL LOW (ref >90)
GFR, EST AFRICAN AMERICAN: 24 mL/min — AB (ref >90)
GLUCOSE: 154 mg/dL — AB (ref 70–99)
Potassium: 3.6 mmol/L (ref 3.5–5.1)
Sodium: 141 mmol/L (ref 135–145)

## 2014-06-26 LAB — GLUCOSE, CAPILLARY
GLUCOSE-CAPILLARY: 192 mg/dL — AB (ref 70–99)
GLUCOSE-CAPILLARY: 110 mg/dL — AB (ref 70–99)
Glucose-Capillary: 153 mg/dL — ABNORMAL HIGH (ref 70–99)
Glucose-Capillary: 197 mg/dL — ABNORMAL HIGH (ref 70–99)

## 2014-06-26 MED ORDER — NEBIVOLOL HCL 2.5 MG PO TABS
2.5000 mg | ORAL_TABLET | Freq: Every day | ORAL | Status: DC
  Administered 2014-06-27: 2.5 mg via ORAL
  Filled 2014-06-26: qty 1

## 2014-06-26 NOTE — Progress Notes (Signed)
Patient: [name] / Admit Date: 06/17/2014 / Date of Encounter: 06/26/2014, 7:46 AM   Subjective: Feeling well. Slept better. No CP or SOB.   Objective: Telemetry: Currently SB 45bpm, very brief atrial tach overnight, occasional PVCs Physical Exam: Blood pressure 163/66, pulse 50, temperature 97.9 F (36.6 C), temperature source Oral, resp. rate 18, height 5\' 6"  (1.676 m), weight 207 lb (93.895 kg), SpO2 94 %. General: Well developed obese AAF, in no acute distress. Head: Normocephalic, atraumatic, sclera non-icteric, no xanthomas, nares are without discharge. Neck: JVP not elevated. Lungs: Clear bilaterally to auscultation without wheezes, rales, or rhonchi. Breathing is unlabored. Heart: RRR albeit slightly slow, S1 S2 without murmurs, rubs, or gallops.  Abdomen: Soft, non-tender, non-distended with normoactive bowel sounds. No rebound/guarding. Extremities: No edema RLE. S/p L BKA. Left radial site without ecchymosis, hematoma or bruit. Neuro: Alert and oriented X 3. Moves all extremities spontaneously. Psych:  Responds to questions appropriately with a normal affect.   Intake/Output Summary (Last 24 hours) at 06/26/14 0746 Last data filed at 06/25/14 1908  Gross per 24 hour  Intake    600 ml  Output      0 ml  Net    600 ml    Inpatient Medications:  . amLODipine  10 mg Oral Daily  . antiseptic oral rinse  7 mL Mouth Rinse BID  . aspirin  81 mg Oral Daily  . calcitRIOL  0.25 mcg Oral Daily  . clopidogrel  75 mg Oral Daily  . donepezil  5 mg Oral QHS  . ezetimibe  10 mg Oral q morning - 10a  . feeding supplement (GLUCERNA SHAKE)  237 mL Oral TID BM  . furosemide  80 mg Oral Daily  . gabapentin  300 mg Oral QHS  . hydrALAZINE  100 mg Oral TID  . insulin aspart  0-20 Units Subcutaneous TID WC  . insulin aspart  0-5 Units Subcutaneous QHS  . insulin aspart protamine- aspart  10 Units Subcutaneous Q supper  . insulin aspart protamine- aspart  18 Units  Subcutaneous Q breakfast  . isosorbide mononitrate  60 mg Oral Daily  . latanoprost  1 drop Both Eyes QHS  . methylPREDNISolone (SOLU-MEDROL) injection  125 mg Intravenous Once  . mometasone-formoterol  2 puff Inhalation BID  . nebivolol  5 mg Oral Daily  . pantoprazole  40 mg Oral BID  . sertraline  100 mg Oral Daily   Infusions:    Labs:  Recent Labs  06/25/14 0535 06/26/14 0327  NA 138 141  K 4.0 3.6  CL 101 100  CO2 29 30  GLUCOSE 139* 154*  BUN 59* 61*  CREATININE 2.01* 2.11*  CALCIUM 8.6 9.1   No results for input(s): AST, ALT, ALKPHOS, BILITOT, PROT, ALBUMIN in the last 72 hours.  Recent Labs  06/24/14 0417  WBC 8.3  HGB 12.1  HCT 38.6  MCV 87.1  PLT 263   No results for input(s): CKTOTAL, CKMB, TROPONINI in the last 72 hours. Invalid input(s): POCBNP No results for input(s): HGBA1C in the last 72 hours.   Radiology/Studies:  Dg Chest 2 View  06/17/2014   CLINICAL DATA:  Shortness of breath and fever for 1 week.  EXAM: CHEST  2 VIEW  COMPARISON:  09/06/2012  FINDINGS: The heart is enlarged but stable. There is tortuosity and calcification the thoracic aorta. There is moderate vascular congestion without overt pulmonary edema. Chronic bronchitic changes but no definite infiltrates or pleural effusion. Bony thorax is  intact.  IMPRESSION: Cardiac enlargement and vascular congestion without overt pulmonary edema.  Chronic bronchitic changes without definite infiltrates.   Electronically Signed   By: Kalman Jewels M.D.   On: 06/17/2014 14:46   Nm Myocar Multi W/spect W/wall Motion / Ef  06/20/2014   CLINICAL DATA:  NSTEMI  EXAM: MYOCARDIAL IMAGING WITH SPECT (REST AND PHARMACOLOGIC-STRESS)  GATED LEFT VENTRICULAR WALL MOTION STUDY  LEFT VENTRICULAR EJECTION FRACTION  TECHNIQUE: Standard myocardial SPECT imaging was performed after resting intravenous injection of 10 mCi Tc-13m sestamibi. Subsequently, intravenous infusion of Lexiscan was performed under the supervision  of the Cardiology staff. At peak effect of the drug, 30 mCi Tc-16m sestamibi was injected intravenously and standard myocardial SPECT imaging was performed. Quantitative gated imaging was also performed to evaluate left ventricular wall motion, and estimate left ventricular ejection fraction.  COMPARISON:  None.  FINDINGS: Baseline EKG showed NSR with lateral infarct and T wave abnormality consistent with inferolateral ischemia. There were occasional PVC's during the infusion but no change in ST abnormality during infusion.  RAW images show mild breast tissue attenuation.  Perfusion: Small, mild reversible defect in the inferolateral wall that could be due to shifting breast attenuation artifact but cannot rule out a very small area of ischemia.  Wall Motion: Mildly reduced LVF with global hypokinesis. Mild LV dilatation.  Left Ventricular Ejection Fraction: 44 %  End diastolic volume 630 ml  End systolic volume 55 ml  IMPRESSION: 1. Small, mild reversible defect in the inferolateral wall that could be due to shifting breast attenuation but cannot rule out a very small area of ischemia.  2. Mild LV dysfunction with global hypokinesis and mildly dilated LV.  3. Left ventricular ejection fraction 44%  4. Intermediate-risk stress test findings*.  *2012 Appropriate Use Criteria for Coronary Revascularization Focused Update: J Am Coll Cardiol. 1601;09(3):235-573. http://content.airportbarriers.com.aspx?articleid=1201161   Electronically Signed   By: Fransico Him   On: 06/20/2014 16:35   Mm Digital Screening Bilateral  06/09/2014   CLINICAL DATA:  Screening.  EXAM: DIGITAL SCREENING BILATERAL MAMMOGRAM WITH CAD  COMPARISON:  Previous exam(s).  ACR Breast Density Category c: The breast tissue is heterogeneously dense, which may obscure small masses.  FINDINGS: There are no findings suspicious for malignancy. Images were processed with CAD.  IMPRESSION: No mammographic evidence of malignancy. A result letter of this  screening mammogram will be mailed directly to the patient.  RECOMMENDATION: Screening mammogram in one year. (Code:[id number]-B-01Y)  BI-RADS CATEGORY  1: Negative.   Electronically Signed   By: Enrique Sack M.D.   On: 06/09/2014 12:49     Assessment and Plan  1. NSTEMI/CAD - moderate nonobstructive disease in RCA/LAD, moderately-severe distal Cx stenosis by cath 06/22/14 -> for medical therapy. If she continues to have angina despite optimal medical therapy, could consider PCI of the distal AV groove Circumflex stenosis.  2. AKI on CKD with intermittent hyperkalemia 3. Essential HTN 4. Chronic diastolic CHF - EF 22-02%, GR3DD by echo 06/23/14 5. Type 2 DM with renal/circulatory complications 6. Sinus bradycardia 7. HLD 8. PAD s/p L BKA 9. Obesity Body mass index is 33.43 kg/(m^2).   Did not receive BB at all yesterday due to slow HR. I tentatively held [place] today and restarted at lowest dose of 2.5mg  tomorrow but we may need to hold off altogether and reassess in the office.  Cr remains plateaued, slightly up from yesterday with elevated BUN 61 - have d/c'd Lasix for today. Recent outpatient creatinines variable, but baseline  appears 1.4-1.5 (and rose to mid-2's while on metolazone).  Have asked nurse to recheck BP/O2 sat this AM as it has been variable.  Outpatient notes indicate to continue statin but she has not been on this in June. I reviewed her outpatient record and atorva 80mg  insidiously fell off her list sometime before her hospitalization, without documentation of instructions to stop from anyone's office. The patient thinks her PCP or nursing home stopped it due to some side effects but she cannot remember why. Will defer to primary cardiologist in f/u.  Signed, Melina Copa PA-C  Attending note:  Patient seen and examined. Discussed the case with Ms. Purcell Mouton. Patient is reasonably stable from a symptom perspective, although remains significantly bradycardic, and we have had to hold  Bystolic altogether. Also hold Lasix today with creatinine at 2.1. Not ready for discharge yet. Need to continue to adjust medications, hopefully home in the next 24-48 hours.  Satira Sark, M.D., F.A.C.C.

## 2014-06-27 ENCOUNTER — Other Ambulatory Visit: Payer: Self-pay | Admitting: Physician Assistant

## 2014-06-27 ENCOUNTER — Encounter (HOSPITAL_COMMUNITY): Payer: Self-pay | Admitting: Physician Assistant

## 2014-06-27 ENCOUNTER — Telehealth: Payer: Self-pay | Admitting: Nurse Practitioner

## 2014-06-27 DIAGNOSIS — G4733 Obstructive sleep apnea (adult) (pediatric): Secondary | ICD-10-CM

## 2014-06-27 DIAGNOSIS — N189 Chronic kidney disease, unspecified: Secondary | ICD-10-CM

## 2014-06-27 LAB — BASIC METABOLIC PANEL
Anion gap: 6 (ref 5–15)
BUN: 55 mg/dL — ABNORMAL HIGH (ref 6–23)
CO2: 33 mmol/L — ABNORMAL HIGH (ref 19–32)
Calcium: 8.9 mg/dL (ref 8.4–10.5)
Chloride: 101 mEq/L (ref 96–112)
Creatinine, Ser: 1.9 mg/dL — ABNORMAL HIGH (ref 0.50–1.10)
GFR calc Af Amer: 27 mL/min — ABNORMAL LOW (ref >90)
GFR calc non Af Amer: 23 mL/min — ABNORMAL LOW (ref >90)
Glucose, Bld: 122 mg/dL — ABNORMAL HIGH (ref 70–99)
Potassium: 3.8 mmol/L (ref 3.5–5.1)
Sodium: 140 mmol/L (ref 135–145)

## 2014-06-27 LAB — CBC
HCT: 41.9 % (ref 36.0–46.0)
Hemoglobin: 13.4 g/dL (ref 12.0–15.0)
MCH: 27.9 pg (ref 26.0–34.0)
MCHC: 32 g/dL (ref 30.0–36.0)
MCV: 87.3 fL (ref 78.0–100.0)
PLATELETS: 313 10*3/uL (ref 150–400)
RBC: 4.8 MIL/uL (ref 3.87–5.11)
RDW: 16.4 % — ABNORMAL HIGH (ref 11.5–15.5)
WBC: 7.9 10*3/uL (ref 4.0–10.5)

## 2014-06-27 LAB — GLUCOSE, CAPILLARY
Glucose-Capillary: 137 mg/dL — ABNORMAL HIGH (ref 70–99)
Glucose-Capillary: 150 mg/dL — ABNORMAL HIGH (ref 70–99)

## 2014-06-27 MED ORDER — CLOPIDOGREL BISULFATE 75 MG PO TABS: 75.0000 mg | ORAL_TABLET | Freq: Every day | ORAL | Status: DC

## 2014-06-27 MED ORDER — AMLODIPINE BESYLATE 10 MG PO TABS: 10.0000 mg | ORAL_TABLET | Freq: Every day | ORAL | Status: DC

## 2014-06-27 MED ORDER — ISOSORBIDE MONONITRATE ER 60 MG PO TB24: 60.0000 mg | ORAL_TABLET | Freq: Every day | ORAL | Status: DC

## 2014-06-27 MED ORDER — INSULIN NPH ISOPHANE & REGULAR (70-30) 100 UNIT/ML ~~LOC~~ SUSP: SUBCUTANEOUS | Status: DC

## 2014-06-27 MED ORDER — ATORVASTATIN CALCIUM 40 MG PO TABS: 40.0000 mg | ORAL_TABLET | Freq: Every day | ORAL | Status: DC

## 2014-06-27 MED ORDER — NEBIVOLOL HCL 2.5 MG PO TABS: 2.5000 mg | ORAL_TABLET | Freq: Every day | ORAL | Status: DC

## 2014-06-27 MED ORDER — ATORVASTATIN CALCIUM 40 MG PO TABS
40.0000 mg | ORAL_TABLET | Freq: Every day | ORAL | Status: DC
  Filled 2014-06-27: qty 1

## 2014-06-27 MED ORDER — ASPIRIN 81 MG PO CHEW: 81.0000 mg | CHEWABLE_TABLET | Freq: Every day | ORAL | Status: DC

## 2014-06-27 NOTE — Progress Notes (Signed)
Physical Therapy Treatment Patient Details Name: [name] MRN: [id number] DOB: [birth year] Today's Date: 06/27/2014    History of Present Illness Pt adm with NSTEMI. PMH- lt BKA, DM, heart failure, HTN    PT Comments    Pt moving well and able to increase ambulation distance. Pt requires total assist to don prosthetic as well as mod assist for pericare in standing and states spouse does these things for her at home. Pt encouraged to increase mobility and ambulation with maintained HR at 66 with gait. Will continue to follow.   Follow Up Recommendations  Home health PT;Supervision for mobility/OOB     Equipment Recommendations       Recommendations for Other Services       Precautions / Restrictions Precautions Precautions: Fall Other Brace/Splint: LLE prosthesis Restrictions Weight Bearing Restrictions: No    Mobility  Bed Mobility Overal bed mobility: Needs Assistance Bed Mobility: Rolling;Sidelying to Sit Rolling: Min guard Sidelying to sit: Min assist       General bed mobility comments: cues to fully rotate trunk with min assist to elevate trunk from surface  Transfers Overall transfer level: Needs assistance   Transfers: Sit to/from Stand Sit to Stand: Min assist;From elevated surface         General transfer comment: cues for sequence, hand placement and assist for anterior translation from bed and BSC over toilet  Ambulation/Gait Ambulation/Gait assistance: Min guard Ambulation Distance (Feet): 60 Feet (60', then 15' after seated rest at toilet) Assistive device: Rolling walker (2 wheeled) Gait Pattern/deviations: Step-through pattern;Decreased stride length;Trunk flexed;Decreased dorsiflexion - left;Decreased stance time - left   Gait velocity interpretation: Below normal speed for age/gender General Gait Details: cues for posture and position in RW   Stairs            Wheelchair Mobility    Modified Rankin (Stroke Patients  Only)       Balance Overall balance assessment: Needs assistance   Sitting balance-Leahy Scale: Fair       Standing balance-Leahy Scale: Poor                      Cognition Arousal/Alertness: Awake/alert Behavior During Therapy: WFL for tasks assessed/performed Overall Cognitive Status: Within Functional Limits for tasks assessed       Memory:  (pt unaware of how to don prosthesis as spouse does it for her)              Exercises      General Comments        Pertinent Vitals/Pain Pain Assessment: No/denies pain    Home Living                      Prior Function            PT Goals (current goals can now be found in the care plan section) Progress towards PT goals: Progressing toward goals    Frequency       PT Plan Current plan remains appropriate    Co-evaluation             End of Session Equipment Utilized During Treatment: Gait belt Activity Tolerance: Patient tolerated treatment well Patient left: in chair;with call bell/phone within [name]     Time: 0800-0830 PT Time Calculation (min) (ACUTE ONLY): 30 min  Charges:  $Gait Training: 8-22 mins $Therapeutic Activity: 8-22 mins  G CodesMelford [name] 07/09/2014, 9:32 AM [name], [place]

## 2014-06-27 NOTE — Discharge Summary (Signed)
Discharge Summary   Patient ID: [name],  MRN: [id number], DOB/AGE: [birth year] 81 y.o.  Admit date: 06/17/2014 Discharge date: 06/27/2014  Primary Care Provider: Renato Shin Primary Cardiologist: Dr. Meda Coffee  Discharge Diagnoses Principal Problem:   NSTEMI (non-ST elevated myocardial infarction) Active Problems:   DM (diabetes mellitus), type 2, uncontrolled, with renal complications   Hyperlipidemia   Obstructive sleep apnea   Essential hypertension   Coronary artery disease due to lipid rich plaque; 70% ostial-proximal AV Groove Cx - not good PCI target   Acute renal failure superimposed on stage 3 chronic kidney disease   Hypertension associated with diabetes   Acute on chronic combined systolic and diastolic congestive heart failure, NYHA class 3   Sinus bradycardia   Contrast media allergy   Abnormal nuclear stress test   Hyperkalemia   Allergies Allergies  Allergen Reactions  . Iohexol      Code: RASH, Desc: [place] ON PT'S CHART ALLERGIC TO IV DYE 09/04/07/RM, Onset Date: [id number]     Procedures  Echocardiogram 06/23/2014 LV EF: 50% -  55%  ------------------------------------------------------------------- Indications:   MI - acute 410.91.  ------------------------------------------------------------------- History:  PMH:  Coronary artery disease. Congestive heart failure. Chronic obstructive pulmonary disease. Risk factors: Hypertension. Diabetes mellitus. Dyslipidemia.  ------------------------------------------------------------------- Study Conclusions  - Left ventricle: The cavity size was normal. There was moderate concentric hypertrophy. Systolic function was normal. The estimated ejection fraction was in the range of 50% to 55%. Wall motion was normal; there were no regional wall motion abnormalities. Doppler parameters are consistent with a reversible restrictive pattern, indicative of decreased  left ventricular diastolic compliance and/or increased left atrial pressure (grade 3 diastolic dysfunction). - Aortic valve: Cusp separation was reduced. - Mitral valve: Moderately calcified annulus. - Left atrium: The atrium was mildly dilated.    Cardiac catheterization 06/22/2014 Procedure Performed:  1. Left Heart Catheterization 2. Selective Coronary Angiography  Hemodynamic Findings: Central aortic pressure: 144/56 Left ventricular pressure: 141/13/23  Angiographic Findings:  Left main: No obstructive disease.   Left Anterior Descending Artery: Large caliber vessel that courses to the apex. The proximal and mid vessel is calcified. The proximal vessel has diffuse 20% stenosis. The mid vessel has diffuse 30% stenosis. The distal vessel has mild plaque. The small to moderate caliber diagonal branch has ostial 30% stenosis.   Circumflex Artery: Large caliber vessel with large caliber obtuse marginal branch. The distal AV groove Circumflex has a 70% stenosis just after the takeoff of OM1. (this has progressed from the prior cath).   Right Coronary Artery: Large dominant vessel with diffuse 50% proximal to mid stenosis. Appears unchanged from last cath.   Left Ventricular Angiogram: Deferred.   45cc contrast used.   Impression: 1. Moderate non-obstructive disease in the RCA and LAD 2. Moderately severe distal Circumflex artery stenosis  Recommendations: I would recommend medical management of her distal Circumflex stenosis. The lesion is just past the takeoff of the large caliber OM branch and PCI would potentially compromise flow into the larger OM branch. Given her renal insufficiency, will hydrate aggressively post-cath. If she continues to have angina despite optimal medical therapy, could consider PCI of the distal AV groove Circumflex stenosis.    Complications: None. The patient tolerated the procedure well.     Hospital Course  The patient is a  81 year old African-American female with history of diastolic heart failure, nonobstructive CAD, DM2, HTN, HL, sleep apnea and CKD. On her last office follow-up with Dr. Meda Coffee on 06/08/2014, patient complained  of worsening lower extremity edema and progressive shortness of breath for the last 2-3 month. Of note, her Lasix was decreased in October due to worsening symptoms creatinine. She also complained of orthopnea or paroxysmal nocturnal dyspnea at the time. Dr. Meda Coffee ordered a repeat nuclear stress test to assess for ischemia, however this was not done.  Patient presented to [facility] with complaint of chest pain radiating to her arm and associated shortness of breath, nausea and diaphoresis. Her initial laboratory was significant for creatinine of 1.33, troponin 2.57, BNP 1951. Chest x-ray showed cardiac enlargement, vascular congestion without pulmonary edema. She was ruled in for NTEMI, she was seen by cardiology team and that was subsequently transferred from [facility] cone to be admitted in CCU. Physical exam on arrival also showed mild volume fluid overload with increased right lower extremity edema and venous congestion, she was given 80 mg daily of Lasix. her subsequent troponin trended trended down to 2.4, however eventually rose back up to 3.21 before to anemia again. She was placed on heparin drip fllowing admission. She was also placed on nitro drip on arrival as well as her systolic blood pressure at [place] was >200. given her high risk of contrast nephropathy, Myoview was initially obtained on 06/20/2014 for risk stratification which showed EF 44%, small mild reversible defect in the inferolateral wall which could be to breast attenuation versus ischemia, mild LV dysfunction with global hypokinesis, intermediate risk stress test finding. Amlodipine was added to her right medical regimen to help control blood pressure, labetalol was discontinued due to sinus bradycardia although  this also may be related to her Aricept. Plavix was added on 06/19/2014 for NSTEMI.  nitro drip was initially discontinued and the change to long-acting nitrate, however he had profoundly elevated blood pressure, which prompted to reinitiation of nitro drip on 06/21/2014. She had episode of hyperkalemia with potassium 6. 6 in the morning of 1/6, repeat labs also shows potassium of 6.6, patient was given insulin and Kaxalate. EKG showed no acute changes.  she underwent a scheduled cardiac catheterization on the same day which showed 70% stenosis in distal AV circumflex groove after takeoff of OM1, 20% oxygen LAD, 30% mid LAD, diffuse 50% proximal to mid RCA stenosis. LV gram was deferred given her high risk for contrast nephropathy. Medical regiment was recommended for her distal circumflex stenosis given the close proximity to large caliber OM branch and high risk of occluding the OM branch with intervention. She was hydrated aggressively post cath given her renal insufficiency. Echocardiogram obtained on the same day showed EF 25-36%, grade 3 diastolic dysfunction. Post cath, patient has some contrast nephropathy with creatinine peaked on 1/8 with 2.13. She was initially started on ACE inhibitor after cath, this was held in light of contrast nephropathy.   She was seen the morning of 06/27/2014, at which time she denies any significant shortness breath or chest discomfort. She ambulated without obvious complaints. She is deemed stable for discharge from cardiology perspective. Per cath note, if she has recurrent angina symptom, high risk PCI of left circumflex artery stenosis can be considered. Of note, this patient was admitted along with a systolic blood pressure of greater than 200, on discharge, her blood pressure continued to fluctuate between 140 to 170s. This will need to be readdressed on follow-up and potentially titrate her blood pressure medication. She also has significant bradycardia with beta blocker,  we'll resume low-dose beta blocker after discharge. Spironolactone was deemed not a good choice  for this patient with her intermittent hyperkalemia. Since she had significant contrast nephropathy after recent cardiac catheterization. Her current creatinine is 1.9 which is down from 2.1 after cath. After discussing with Dr. Gillian Shields, we will continue to hold her Lasix along with potassium chloride after discharge. However given the fact she had significant lower extremity edema on her November follow-up after her Lasix was cut back, she is at very high risk for recurrent fluid overload after discharge. I have scheduled transition of care follow-up within 5 days, at which time a BMET will be obtained and she potentially will need to resume on 80mg  daily Lasix. We will also obtain home health PT for the patient.    Discharge Vitals Blood pressure 170/64, pulse 66, temperature 98.1 F (36.7 C), temperature source Oral, resp. rate 16, height 5\' 6"  (1.676 m), weight 207 lb (93.895 kg), SpO2 100 %.  Filed Weights   06/17/14 1242 06/21/14 0556  Weight: 218 lb (98.884 kg) 207 lb (93.895 kg)    Labs  CBC  Recent Labs  06/27/14 0330  WBC 7.9  HGB 13.4  HCT 41.9  MCV 87.3  PLT 938   Basic Metabolic Panel  Recent Labs  06/26/14 0327 06/27/14 0330  NA 141 140  K 3.6 3.8  CL 100 101  CO2 30 33*  GLUCOSE 154* 122*  BUN 61* 55*  CREATININE 2.11* 1.90*  CALCIUM 9.1 8.9    Disposition  Pt is being discharged home today in good condition.  Follow-up Plans & Appointments      Follow-up Information    Follow up with Truitt Merle, NP On 07/01/2014.   Specialty:  Nurse Practitioner   Why:  2:00pm. TOC followup, BMET on the same day, please arrive 30-1 hr before appt time to obtain BMET lab to check K and renal function.   Contact information:   [address] [place] [address] [phone]       Discharge Medications    Medication List    STOP taking these  medications        allopurinol 100 MG tablet  Commonly known as:  ZYLOPRIM     aspirin 325 MG tablet  Replaced by:  aspirin 81 MG chewable tablet     furosemide 80 MG tablet  Commonly known as:  LASIX     labetalol 100 MG tablet  Commonly known as:  NORMODYNE     potassium chloride SA 20 MEQ tablet  Commonly known as:  K-DUR,KLOR-CON      TAKE these medications        acetaminophen 500 MG tablet  Commonly known as:  TYLENOL  Take 500 mg by mouth every 6 (six) hours as needed for pain. For pain     albuterol 108 (90 BASE) MCG/ACT inhaler  Commonly known as:  PROVENTIL HFA;VENTOLIN HFA  Inhale 2 puffs into the lungs every 4 (four) hours as needed for wheezing or shortness of breath.     albuterol (2.5 MG/3ML) 0.083% nebulizer solution  Commonly known as:  PROVENTIL  Take 3 mLs (2.5 mg total) by nebulization every 4 (four) hours as needed for wheezing or shortness of breath.     amLODipine 10 MG tablet  Commonly known as:  NORVASC  Take 1 tablet (10 mg total) by mouth daily.     aspirin 81 MG chewable tablet  Chew 1 tablet (81 mg total) by mouth daily.     atorvastatin 40 MG tablet  Commonly known as:  LIPITOR  Take 1 tablet (40 mg total) by mouth daily at 6 PM.     bisacodyl 5 MG EC tablet  Commonly known as:  DULCOLAX  Take 1 tablet (5 mg total) by mouth daily as needed for moderate constipation.     calcitRIOL 0.25 MCG capsule  Commonly known as:  ROCALTROL  TAKE ONE CAPSULE BY MOUTH EVERY DAY     clopidogrel 75 MG tablet  Commonly known as:  PLAVIX  Take 1 tablet (75 mg total) by mouth daily.     docusate sodium 100 MG capsule  Commonly known as:  COLACE  Take 100 mg by mouth 2 (two) times daily as needed for mild constipation (constipation).     donepezil 5 MG tablet  Commonly known as:  ARICEPT  Take 1 tablet (5 mg total) by mouth at bedtime.     ezetimibe 10 MG tablet  Commonly known as:  ZETIA  Take 1 tablet (10 mg total) by mouth every morning.       feeding supplement (GLUCERNA SHAKE) Liqd  Take 237 mLs by mouth 2 (two) times daily.     Fluticasone-Salmeterol 100-50 MCG/DOSE Aepb  Commonly known as:  ADVAIR DISKUS  Inhale 1 puff into the lungs 2 (two) times daily.     gabapentin 300 MG capsule  Commonly known as:  NEURONTIN  TAKE 1 CAPSULE BY MOUTH AT BEDTIME     glucose blood test strip  Commonly known as:  ONE TOUCH ULTRA TEST  Use 2 times per day     hydrALAZINE 25 MG tablet  Commonly known as:  APRESOLINE  TAKE 1 TABLET BY MOUTH THREE TIMES DAILY     HYDROcodone-acetaminophen 10-325 MG per tablet  Commonly known as:  NORCO  Take one tablet by mouth every 4 hours as needed for pain     insulin NPH-regular Human (70-30) 100 UNIT/ML injection  Commonly known as:  NOVOLIN 70/30  18 units with breakfast, and 10 units with the evening meal.     isosorbide mononitrate 60 MG 24 hr tablet  Commonly known as:  IMDUR  Take 1 tablet (60 mg total) by mouth daily.     latanoprost 0.005 % ophthalmic solution  Commonly known as:  XALATAN  Place 1 drop into both eyes at bedtime.     nebivolol 2.5 MG tablet  Commonly known as:  BYSTOLIC  Take 1 tablet (2.5 mg total) by mouth daily.     nitroGLYCERIN 0.4 MG SL tablet  Commonly known as:  NITROSTAT  Place 0.4 mg under the tongue every 5 (five) minutes as needed for chest pain.     pantoprazole 40 MG tablet  Commonly known as:  PROTONIX  TAKE 1 TABLET BY MOUTH TWICE DAILY     polyethylene glycol packet  Commonly known as:  MIRALAX / GLYCOLAX  Take 17 g by mouth daily as needed for mild constipation.     sertraline 100 MG tablet  Commonly known as:  ZOLOFT  TAKE 1 TABLET BY MOUTH EVERY DAY         Duration of Discharge Encounter   Greater than 30 minutes including physician time.  Hilbert Corrigan PA-C Pager: [phone] 06/27/2014, 10:31 AM   Personally seen and examined. Agree with above. May need lasix once again. Held for creatinine increase.  Candee Furbish, MD

## 2014-06-27 NOTE — Progress Notes (Signed)
1445 Cardiac Rehab Pt is not appropriate for Outpt. CRP  due to her deconditioned state and all she able to walk is just in the home. Deon Pilling, RN 06/27/2014 1:50 PM

## 2014-06-27 NOTE — Progress Notes (Signed)
Medicare Important Message given? YES  (If response is "NO", the following Medicare IM given date fields will be blank)  Date Medicare IM given: 06/27/14 Medicare IM given by:  Dahlia Client [employer]

## 2014-06-27 NOTE — Telephone Encounter (Signed)
New problem   Pt has TCM 1.15.16 @ 2pm w/Lori.

## 2014-06-27 NOTE — Progress Notes (Signed)
Patient: [name] / Admit Date: 06/17/2014 / Date of Encounter: 06/27/2014, 8:25 AM   Subjective: Feeling well. No CP or SOB.   Objective: Telemetry: Currently NSR  Physical Exam: Blood pressure 170/64, pulse 66, temperature 98.1 F (36.7 C), temperature source Oral, resp. rate 16, height 5\' 6"  (1.676 m), weight 207 lb (93.895 kg), SpO2 100 %. General: Well developed obese AAF, in no acute distress. Head: Normocephalic, atraumatic, sclera non-icteric, no xanthomas, nares are without discharge. Neck: JVP not elevated. Lungs: Clear bilaterally to auscultation without wheezes, rales, or rhonchi. Breathing is unlabored. Heart: RRR albeit slightly slow, S1 S2 without murmurs, rubs, or gallops.  Abdomen: Soft, non-tender, non-distended with normoactive bowel sounds. No rebound/guarding. Extremities: No edema RLE. S/p L BKA. Left radial site without ecchymosis, hematoma or bruit. Neuro: Alert and oriented X 3. Moves all extremities spontaneously. Psych:  Responds to questions appropriately with a normal affect.   Intake/Output Summary (Last 24 hours) at 06/27/14 0825 Last data filed at 06/26/14 0900  Gross per 24 hour  Intake    240 ml  Output      0 ml  Net    240 ml    Inpatient Medications:  . amLODipine  10 mg Oral Daily  . antiseptic oral rinse  7 mL Mouth Rinse BID  . aspirin  81 mg Oral Daily  . calcitRIOL  0.25 mcg Oral Daily  . clopidogrel  75 mg Oral Daily  . donepezil  5 mg Oral QHS  . ezetimibe  10 mg Oral q morning - 10a  . feeding supplement (GLUCERNA SHAKE)  237 mL Oral TID BM  . gabapentin  300 mg Oral QHS  . hydrALAZINE  100 mg Oral TID  . insulin aspart  0-20 Units Subcutaneous TID WC  . insulin aspart  0-5 Units Subcutaneous QHS  . insulin aspart protamine- aspart  10 Units Subcutaneous Q supper  . insulin aspart protamine- aspart  18 Units Subcutaneous Q breakfast  . isosorbide mononitrate  60 mg Oral Daily  . latanoprost  1 drop Both Eyes QHS    . methylPREDNISolone (SOLU-MEDROL) injection  125 mg Intravenous Once  . mometasone-formoterol  2 puff Inhalation BID  . nebivolol  2.5 mg Oral Daily  . pantoprazole  40 mg Oral BID  . sertraline  100 mg Oral Daily   Infusions:    Labs:  Recent Labs  06/26/14 0327 06/27/14 0330  NA 141 140  K 3.6 3.8  CL 100 101  CO2 30 33*  GLUCOSE 154* 122*  BUN 61* 55*  CREATININE 2.11* 1.90*  CALCIUM 9.1 8.9     Assessment and Plan  1. NSTEMI/CAD - moderate nonobstructive disease in RCA/LAD, moderately-severe distal Cx stenosis by cath 06/22/14 -> for medical therapy. If she continues to have angina despite optimal medical therapy, could consider PCI of the distal AV groove Circumflex stenosis.   - ASA, Plavix 2. AKI on CKD with intermittent hyperkalemia 3. Essential HTN  - Challenging to control. No ACE-I because of renal dz.  4. Chronic diastolic CHF - EF 51-76%, GR3DD by echo 06/23/14 5. Type 2 DM with renal/circulatory complications 6. Sinus bradycardia- now NSR 7. HLD  - restarted atorva. Continue Zetia.  8. PAD s/p L BKA 9. Obesity Body mass index is 33.43 kg/(m^2).   Bradycardia improved (60) - Bystolic 2.5 QD should be OK - occasional ventricular rhythm noted.   Cr remains plateaued, slightly improved from yesterday have d/c'd Lasix. Recent outpatient creatinines variable,  but baseline appears 1.4-1.5 (and rose to mid-2's while on metolazone).  Outpatient notes indicate to continue statin but she has not been on this in June. I will restart atorva 40. (was supposed to be on 80 in the past)  OK for DC today  Signed, Candee Furbish, MD

## 2014-06-27 NOTE — Discharge Instructions (Signed)
Coronary Angiogram A coronary angiogram, also called coronary angiography, is an X-ray procedure used to look at the arteries in the heart. In this procedure, a dye (contrast dye) is injected through a long, hollow tube (catheter). The catheter is about the size of a piece of cooked spaghetti and is inserted through your groin, wrist, or arm. The dye is injected into each artery, and X-rays are then taken to show if there is a blockage in the arteries of your heart. LET [facility] CARE PROVIDER KNOW ABOUT:  Any allergies you have, including allergies to shellfish or contrast dye.   All medicines you are taking, including vitamins, herbs, eye drops, creams, and over-the-counter medicines.   Previous problems you or members of your family have had with the use of anesthetics.   Any blood disorders you have.   Previous surgeries you have had.  History of kidney problems or failure.   Other medical conditions you have. RISKS AND COMPLICATIONS  Generally, a coronary angiogram is a safe procedure. However, problems can occur and include:  Allergic reaction to the dye.  Bleeding from the access site or other locations.  Kidney injury, especially in people with impaired kidney function.  Stroke (rare).  Heart attack (rare). BEFORE THE PROCEDURE   Do not eat or drink anything after midnight the night before the procedure or as directed by your health care provider.   Ask your health care provider about changing or stopping your regular medicines. This is especially important if you are taking diabetes medicines or blood thinners. PROCEDURE  You may be given a medicine to help you relax (sedative) before the procedure. This medicine is given through an intravenous (IV) access tube that is inserted into one of your veins.   The area where the catheter will be inserted will be washed and shaved. This is usually done in the groin but may be done in the fold of your arm (near your  elbow) or in the wrist.   A medicine will be given to numb the area where the catheter will be inserted (local anesthetic).   The health care provider will insert the catheter into an artery. The catheter will be guided by using a special type of X-ray (fluoroscopy) of the blood vessel being examined.   A special dye will then be injected into the catheter, and X-rays will be taken. The dye will help to show where any narrowing or blockages are located in the heart arteries.  AFTER THE PROCEDURE   If the procedure is done through the leg, you will be kept in bed lying flat for several hours. You will be instructed to not bend or cross your legs.  The insertion site will be checked frequently.   The pulse in your feet or wrist will be checked frequently.   Additional blood tests, X-rays, and an electrocardiogram may be done.  Document Released: 12/08/2002 Document Revised: 10/18/2013 Document Reviewed: 10/26/2012 [facility] Patient Information 2015 [place], [place]. This information is not intended to replace advice given to you by your health care provider. Make sure you discuss any questions you have with your health care provider.  No lifting over 5 lbs for 1 week. No sexual activity for 1 week. Keep procedure site clean & dry. If you notice increased pain, swelling, bleeding or pus, call/return!  You may shower, but no soaking baths/hot tubs/pools for 1 week.

## 2014-06-28 ENCOUNTER — Telehealth: Payer: Self-pay | Admitting: Endocrinology

## 2014-06-28 NOTE — Telephone Encounter (Signed)
See below and please advise, Thanks!  

## 2014-06-28 NOTE — Telephone Encounter (Signed)
No, please hold-off for now.   i'll see you next time.

## 2014-06-28 NOTE — Telephone Encounter (Signed)
Left message for patient to call back  

## 2014-06-28 NOTE — Telephone Encounter (Signed)
Pt's daughter advised of note below and voiced understanding.  

## 2014-06-28 NOTE — Telephone Encounter (Deleted)
tcm

## 2014-06-28 NOTE — Telephone Encounter (Signed)
Patient just got out of the hospital and three of the medication was not on the med list they gave her and want to know if she still need to take these medication. Caltictriol 0.25,  Labetalol, Allopurinol. please advise.

## 2014-06-28 NOTE — Telephone Encounter (Signed)
Patient contacted regarding discharge from [facility] on 06/27/14.  Patient understands to follow up with provider Truitt Merle NP on 07/01/14 at 2 pm at [facility] location. Patient understands discharge instructions? yes  Patient understands medications and regiment? yes  Patient understands to bring all medications to this visit? yes   Pt understands all discharge instructions given and verbalized understanding of new medications prescribed.  Pt reports she feels "good" today.

## 2014-06-28 NOTE — Telephone Encounter (Signed)
F/U          Pt returning call, informed pt of appt on 07/01/14.  Pt states she's having no problems at the moment and may be called if there are any concerns.

## 2014-06-29 ENCOUNTER — Ambulatory Visit: Payer: Medicare Other | Admitting: Vascular Surgery

## 2014-06-29 ENCOUNTER — Encounter (HOSPITAL_COMMUNITY): Payer: Medicare Other

## 2014-07-01 ENCOUNTER — Encounter: Payer: Self-pay | Admitting: Nurse Practitioner

## 2014-07-01 ENCOUNTER — Ambulatory Visit (INDEPENDENT_AMBULATORY_CARE_PROVIDER_SITE_OTHER): Payer: Medicare Other | Admitting: Nurse Practitioner

## 2014-07-01 VITALS — BP 142/62 | HR 57 | Ht 66.0 in | Wt 214.8 lb

## 2014-07-01 DIAGNOSIS — I5042 Chronic combined systolic (congestive) and diastolic (congestive) heart failure: Secondary | ICD-10-CM

## 2014-07-01 DIAGNOSIS — I214 Non-ST elevation (NSTEMI) myocardial infarction: Secondary | ICD-10-CM

## 2014-07-01 DIAGNOSIS — N189 Chronic kidney disease, unspecified: Secondary | ICD-10-CM

## 2014-07-01 DIAGNOSIS — I10 Essential (primary) hypertension: Secondary | ICD-10-CM

## 2014-07-01 DIAGNOSIS — E785 Hyperlipidemia, unspecified: Secondary | ICD-10-CM

## 2014-07-01 LAB — BASIC METABOLIC PANEL
BUN: 31 mg/dL — ABNORMAL HIGH (ref 6–23)
CO2: 27 mEq/L (ref 19–32)
Calcium: 8.9 mg/dL (ref 8.4–10.5)
Chloride: 109 mEq/L (ref 96–112)
Creatinine, Ser: 1.54 mg/dL — ABNORMAL HIGH (ref 0.40–1.20)
GFR: 41.39 mL/min — ABNORMAL LOW (ref >60.00)
Glucose, Bld: 92 mg/dL (ref 70–99)
Potassium: 4.1 mEq/L (ref 3.5–5.1)
Sodium: 142 mEq/L (ref 135–145)

## 2014-07-01 LAB — CBC
HCT: 41.2 % (ref 36.0–46.0)
Hemoglobin: 13 g/dL (ref 12.0–15.0)
MCHC: 31.6 g/dL (ref 30.0–36.0)
MCV: 89.1 fl (ref 78.0–100.0)
Platelets: 331 10*3/uL (ref 150.0–400.0)
RBC: 4.62 Mil/uL (ref 3.87–5.11)
RDW: 17.5 % — ABNORMAL HIGH (ref 11.5–15.5)
WBC: 8.5 10*3/uL (ref 4.0–10.5)

## 2014-07-01 MED ORDER — FUROSEMIDE 80 MG PO TABS: 40.0000 mg | ORAL_TABLET | Freq: Every day | ORAL | Status: DC

## 2014-07-01 MED ORDER — POTASSIUM CHLORIDE CRYS ER 20 MEQ PO TBCR: 20.0000 meq | EXTENDED_RELEASE_TABLET | Freq: Every day | ORAL | Status: DC

## 2014-07-01 NOTE — Progress Notes (Signed)
Cardiology Office Note   Date:  07/01/2014   ID:  [name]  DOB 1929/09/02 MRN [id number]  PCP:  Renato Shin, MD  Cardiologist:  Dr. Meda Coffee   Chief Complaint  Patient presents with  . Coronary Artery Disease    Post hospital visit/TOC - seen for Dr. Meda Coffee.       History of Present Illness: [name] is a 81 y.o. female who presents for a post hospital visit/TOC. Seen for Dr. Marlou Porch. She has a history of diastolic heart failure, prior nonobstructive CAD, DM2, HTN, HL, sleep apnea, PVD with prior L BKA and CKD (followed by Dr. Lorrene Reid).   Last office follow-up with Dr. Meda Coffee on 06/08/2014, patient complained of worsening lower extremity edema and progressive shortness of breath for the last 2-3 month. Of note, her Lasix was decreased in October due to worsening symptoms creatinine. She also complained of orthopnea or paroxysmal nocturnal dyspnea at the time. Dr. Meda Coffee had recommended a repeat nuclear stress test to assess for ischemia however, the study was not completed.   Patient presented to [facility] 2016 with complaint of chest pain radiating to her arm and associated shortness of breath, nausea and diaphoresis. She was ruled in for NTEMI, she was seen by cardiology team and that was subsequently transferred from [facility].  Myoview was initially obtained on 06/20/2014 for risk stratification which showed EF 44%, small mild reversible defect in the inferolateral wall which could be to breast attenuation versus ischemia, mild LV dysfunction with global hypokinesis, intermediate risk stress test finding. Plavix was added for NSTEMI. She had lots of issues with BP management and hyperkalemia while hospitalized. She did have cardiac cath which showed 70% stenosis in distal AV circumflex groove after takeoff of OM1, 20% oxygen LAD, 30% mid LAD, diffuse 50% proximal to mid RCA stenosis. LV gram was deferred given her high risk for contrast nephropathy.  Medical regiment was recommended for her distal circumflex stenosis given the close proximity to large caliber OM branch and high risk of occluding the OM branch with intervention. Per cath note, if she has recurrent angina symptom, high risk PCI of left circumflex artery stenosis can be considered.  Echocardiogram obtained showed EF 50-55%, with grade 3 diastolic dysfunction. Post cath, patient has some contrast nephropathy with creatinine peaked on 1/8 with 2.13.   She has had significant bradycardia but able to be discharged on low dose Bystolic.  Spironolactone was deemed not a good choice for this patient with her intermittent hyperkalemia. Her Lasix continued to be held at discharge as well as ACE.   She was discharged on Monday with plans for close follow up.   Comes in today. Here with her husband Arnell Sieving. Has only been home a few days. No problems so far. Feeling better. Breathing is better. Still with some DOE but seems to be back to her baseline. No more chest pain. BP ok at home. She has been a little dizzy but nothing that really seems to be impacting her activities. She has no swelling in her right leg. Walks with her walker. No falls. They both feel like she is ok so far. Remains off of her Lasix and potassium. Is on just low dose Bystolic. No syncope.   Past Medical History  Diagnosis Date  . Uterine cancer   . Diabetes mellitus     type II; peripheral neuropathy  . Hypertension   . GERD (gastroesophageal reflux disease)   . Peripheral vascular  disease     s/p L BKA 08/2012  . Obesity   . Dyslipidemia   . Diastolic CHF, chronic     EF 50-55%, mild LVH and grade 1 diast. Dysfxn  . Osteoarthritis cervical spine   . DM retinopathy   . Sleep apnea     with CPAP  . History of shingles   . COPD (chronic obstructive pulmonary disease)   . Depression   . Peptic ulcer disease     duodenal  . Peptic stricture of esophagus   . Hiatal hernia   . Diverticulosis   . Arthritis   .  ASCVD (arteriosclerotic cardiovascular disease)     on MRI brin  . CAD (coronary artery disease)     a. s/p multiple caths with nonobs CAD;   b. cath 1/10: pLAD 20%, mLAD 40%, pCFX 20%, mCFX 40%, pRCA 60-70%;   c.  Myoview 06/02/12: Low anterior wall scar, no ischemia, EF 37%. d. cath 06/20/2014 70% mid LCx, medical therapy, high risk for PCI due to close proximity to large OM   . Cardiomyopathy     a. Echo 05/31/12: Severe LVH, EF 40-45%, diffuse HK, grade 1 diastolic dysfunction, mild to moderate calcified aortic valve annulus, mild to moderate aortic stenosis, MAC, mild MR, moderate LAE  . Asthma     Mild  . Pruritic condition     Idiopathic  . Hyperlipidemia   . Zoster   . Noncompliance   . CKD (chronic kidney disease)     Dr. Lorrene Reid    Past Surgical History  Procedure Laterality Date  . Tubal ligation  1967  . Knee arthroscopy  10/1998    Left  . Craniotomy  1997    Left for SDH  . Cataract extraction, bilateral  2005  . Hernia repair    . Esophagogastroduodenoscopy  04/04/2004  . Spine surgery      C-spine and lumbar surgery  . Cholecystectomy  2010  . Cardiac catheterization    . Dexa  7/05  . Amputation Left 09/04/2012    Procedure: AMPUTATION BELOW KNEE;  Surgeon: Angelia Mould, MD;  Location: [facility];  Service: Vascular;  Laterality: Left;  . Abdominal angiogram N/A 08/31/2012    Procedure: ABDOMINAL ANGIOGRAM with runoff poss intervention;  Surgeon: Angelia Mould, MD;  Location: [facility] CATH LAB;  Service: Cardiovascular;  Laterality: N/A;  . Tubal ligation    . Left heart catheterization with coronary angiogram N/A 06/22/2014    Procedure: LEFT HEART CATHETERIZATION WITH CORONARY ANGIOGRAM;  Surgeon: Burnell Blanks, MD;  Location: [facility] CATH LAB;  Service: Cardiovascular;  Laterality: N/A;     Current Outpatient Prescriptions  Medication Sig Dispense Refill  . acetaminophen (TYLENOL) 500 MG tablet Take 500 mg by mouth every 6 (six) hours as needed for  pain. For pain    . albuterol (PROVENTIL HFA;VENTOLIN HFA) 108 (90 BASE) MCG/ACT inhaler Inhale 2 puffs into the lungs every 4 (four) hours as needed for wheezing or shortness of breath. 1 Inhaler 3  . albuterol (PROVENTIL) (2.5 MG/3ML) 0.083% nebulizer solution Take 3 mLs (2.5 mg total) by nebulization every 4 (four) hours as needed for wheezing or shortness of breath. 75 mL 12  . amLODipine (NORVASC) 10 MG tablet Take 1 tablet (10 mg total) by mouth daily. 30 tablet 5  . aspirin 81 MG chewable tablet Chew 1 tablet (81 mg total) by mouth daily.    Marland Kitchen atorvastatin (LIPITOR) 40 MG tablet Take 1 tablet (40 mg  total) by mouth daily at 6 PM. 30 tablet 5  . bisacodyl (DULCOLAX) 5 MG EC tablet Take 1 tablet (5 mg total) by mouth daily as needed for moderate constipation. 30 tablet 0  . calcitRIOL (ROCALTROL) 0.25 MCG capsule TAKE ONE CAPSULE BY MOUTH EVERY DAY 30 capsule 0  . clopidogrel (PLAVIX) 75 MG tablet Take 1 tablet (75 mg total) by mouth daily. 30 tablet 11  . docusate sodium (COLACE) 100 MG capsule Take 100 mg by mouth 2 (two) times daily as needed for mild constipation (constipation).     Marland Kitchen donepezil (ARICEPT) 5 MG tablet Take 1 tablet (5 mg total) by mouth at bedtime. 30 tablet 11  . ezetimibe (ZETIA) 10 MG tablet Take 1 tablet (10 mg total) by mouth every morning. 30 tablet 11  . feeding supplement (GLUCERNA SHAKE) LIQD Take 237 mLs by mouth 2 (two) times daily.    . Fluticasone-Salmeterol (ADVAIR DISKUS) 100-50 MCG/DOSE AEPB Inhale 1 puff into the lungs 2 (two) times daily. 1 each 11  . gabapentin (NEURONTIN) 300 MG capsule TAKE 1 CAPSULE BY MOUTH AT BEDTIME 30 capsule 0  . glucose blood (ONE TOUCH ULTRA TEST) test strip Use 2 times per day 100 each 2  . hydrALAZINE (APRESOLINE) 25 MG tablet TAKE 1 TABLET BY MOUTH THREE TIMES DAILY 90 tablet 0  . HYDROcodone-acetaminophen (NORCO) 10-325 MG per tablet Take one tablet by mouth every 4 hours as needed for pain 180 tablet 0  . insulin NPH-regular  Human (NOVOLIN 70/30) (70-30) 100 UNIT/ML injection 18 units with breakfast, and 10 units with the evening meal. 10 mL 2  . isosorbide mononitrate (IMDUR) 60 MG 24 hr tablet Take 1 tablet (60 mg total) by mouth daily. 30 tablet 5  . latanoprost (XALATAN) 0.005 % ophthalmic solution Place 1 drop into both eyes at bedtime.    . nebivolol (BYSTOLIC) 2.5 MG tablet Take 1 tablet (2.5 mg total) by mouth daily. 30 tablet 5  . nitroGLYCERIN (NITROSTAT) 0.4 MG SL tablet Place 0.4 mg under the tongue every 5 (five) minutes as needed for chest pain.     . pantoprazole (PROTONIX) 40 MG tablet TAKE 1 TABLET BY MOUTH TWICE DAILY 60 tablet 0  . polyethylene glycol (MIRALAX / GLYCOLAX) packet Take 17 g by mouth daily as needed for mild constipation. 14 each 0  . sertraline (ZOLOFT) 100 MG tablet TAKE 1 TABLET BY MOUTH EVERY DAY 30 tablet 0  . furosemide (LASIX) 80 MG tablet Take 0.5 tablets (40 mg total) by mouth daily. 90 tablet 3  . potassium chloride SA (K-DUR,KLOR-CON) 20 MEQ tablet Take 1 tablet (20 mEq total) by mouth daily. 90 tablet 3   No current facility-administered medications for this visit.    Allergies:   Iohexol    Social History:  The patient  reports that she has never smoked. She has never used smokeless tobacco. She reports that she does not drink alcohol or use illicit drugs.   Family History:  The patient's family history includes Asthma in her other; Cancer - Other in her mother; Diabetes in her mother and sister; Heart disease in her mother; Hypertension in her father and mother; Kidney disease in her paternal grandmother; Lymphoma in her father; Rheum arthritis in her father and mother; Stomach cancer in her mother.    ROS:  Please see the history of present illness.   Otherwise, review of systems are positive for some mild dizziness, cough, headaches and DOE.   All other systems  are reviewed and negative.    PHYSICAL EXAM: VS:  BP 142/62 mmHg  Pulse 57  Ht 5\' 6"  (1.676 m)  Wt  214 lb 12.8 oz (97.433 kg)  BMI 34.69 kg/m2 , BMI Body mass index is 34.69 kg/(m^2). GEN: Well nourished, well developed, in no acute distress. She is brought in in a wheelchair. She is obese. HEENT: Normal. Neck: No JVD. Cardiac: RRR; no murmurs, rubs, or gallops. She has no edema.   Respiratory:  Clear to auscultation bilaterally with normal work of breathing.  GI: Obese MS: No deformity or atrophy. Gait not tested.  Skin: Warm and dry. Color normal.  Neuro:  Strength and sensation are intact with no gross focal deficits.  Psych: Alert and appropriate.  EKG:  EKG is ordered today. The ekg ordered today shows sinus bradycardia - rate 57. Inferolateral T wave changes.    Recent Labs:  Lab Results  Component Value Date   WBC 7.9 06/27/2014   HGB 13.4 06/27/2014   HCT 41.9 06/27/2014   PLT 313 06/27/2014   GLUCOSE 122* 06/27/2014   CHOL 139 01/08/2013   TRIG 173.0* 01/08/2013   HDL 53.00 01/08/2013   LDLDIRECT 123.0 07/06/2010   LDLCALC 51 01/08/2013   ALT 22 06/17/2014   AST 34 06/17/2014   NA 140 06/27/2014   K 3.8 06/27/2014   CL 101 06/27/2014   CREATININE 1.90* 06/27/2014   BUN 55* 06/27/2014   CO2 33* 06/27/2014   TSH 2.626 06/17/2014   INR 1.18 06/18/2014   HGBA1C 6.7* 06/17/2014   MICROALBUR 3.2* 01/08/2013    Lab Results  Component Value Date   CKTOTAL 425* 05/30/2012   CKMB 6.5* 05/30/2012   TROPONINI 3.08* 06/18/2014    BNP (last 3 results)  Recent Labs  11/15/13 1205 02/17/14 1202 06/08/14 1617  PROBNP 158.0* 768.0* 1249.0*       Wt Readings from Last 3 Encounters:  07/01/14 214 lb 12.8 oz (97.433 kg)  06/21/14 207 lb (93.895 kg)  06/08/14 218 lb 12.8 oz (99.247 kg)      Other studies Reviewed:  Echo Study Conclusions from 06/2014  - Left ventricle: The cavity size was normal. There was moderate concentric hypertrophy. Systolic function was normal. The estimated ejection fraction was in the range of 50% to 55%. Wall motion  was normal; there were no regional wall motion abnormalities. Doppler parameters are consistent with a reversible restrictive pattern, indicative of decreased left ventricular diastolic compliance and/or increased left atrial pressure (grade 3 diastolic dysfunction). - Aortic valve: Cusp separation was reduced. - Mitral valve: Moderately calcified annulus. - Left atrium: The atrium was mildly dilated.   Cardiac Catheterization Operative Report  KIMBRELY BUCKEL [id number] 1/6/20164:19 PM Renato Shin, MD  Procedure Performed:  1. Left Heart Catheterization 2. Selective Coronary Angiography  Operator: Lauree Chandler, MD  Arterial access site: Left radial artery.   Indication: 81 yo female with known CAD admitted with hypertensive crisis, elevated troponin with flat trend. Stress myoview with possible inferolateral ischemia. Pt with renal insufficiency. Plans today for diagnostic only cath to define CAD.   Procedure Details: The risks, benefits, complications, treatment options, and expected outcomes were discussed with the patient. The patient and/or family concurred with the proposed plan, giving informed consent. The patient was brought to the cath lab after IV hydration was begun and oral premedication was given. The patient was further sedated with Versed and Fentanyl. The left wrist was assessed with a modified Allens test which was positive.  The left wrist was prepped and draped in a sterile fashion. 1% lidocaine was used for local anesthesia. Using the modified Seldinger access technique, a 5 French sheath was placed in the left radial artery. 3 mg Verapamil was given through the sheath. 5000 units IV heparin was given. Standard diagnostic catheters were used to perform selective coronary angiography. The JR4 catheter was used to cross the aortic valve and LV pressures were measured. The sheath was removed from the right radial  artery and a Terumo hemostasis band was applied at the arteriotomy site on the right wrist.   There were no immediate complications. The patient was taken to the recovery area in stable condition.   Hemodynamic Findings: Central aortic pressure: 144/56 Left ventricular pressure: 141/13/23  Angiographic Findings:  Left main: No obstructive disease.   Left Anterior Descending Artery: Large caliber vessel that courses to the apex. The proximal and mid vessel is calcified. The proximal vessel has diffuse 20% stenosis. The mid vessel has diffuse 30% stenosis. The distal vessel has mild plaque. The small to moderate caliber diagonal branch has ostial 30% stenosis.   Circumflex Artery: Large caliber vessel with large caliber obtuse marginal branch. The distal AV groove Circumflex has a 70% stenosis just after the takeoff of OM1. (this has progressed from the prior cath).   Right Coronary Artery: Large dominant vessel with diffuse 50% proximal to mid stenosis. Appears unchanged from last cath.   Left Ventricular Angiogram: Deferred.   45cc contrast used.   Impression: 1. Moderate non-obstructive disease in the RCA and LAD 2. Moderately severe distal Circumflex artery stenosis  Recommendations: I would recommend medical management of her distal Circumflex stenosis. The lesion is just past the takeoff of the large caliber OM branch and PCI would potentially compromise flow into the larger OM branch. Given her renal insufficiency, will hydrate aggressively post-cath. If she continues to have angina despite optimal medical therapy, could consider PCI of the distal AV groove Circumflex stenosis.    Complications: None. The patient tolerated the procedure well.      ASSESSMENT AND PLAN: 1. NSTEMI - to manage medically. She is doing ok clinically. Still pretty tenuous situation however.   2. HTN -  BP improved.   3. CKD - rechecking lab today. Hold off on ACE.   4.  Bradycardia - very little dizziness. Will leave her on the low dose Bystolic  5. Diastolic HF - restart Lasix but at half dose (40mg ) along with half dose potassium (Kdur 20 meq daily).   Current medicines are reviewed at length with the patient today.  Lasix and potassium added back today.   Patient and her husband are agreeable to this plan and will call if any problems develop in the interim.    Labs/ tests ordered today include: BMET and CBC    Orders Placed This Encounter  Procedures  . Basic metabolic panel  . CBC  . EKG 12-Lead     Disposition:   FU with me in 2 to 3 weeks on a day that Dr. Meda Coffee is here as well.    Amie Critchley, NP  07/01/2014 2:57 PM    [facility] [address], [place]  [address] Phone: [phone]; Fax: [phone]

## 2014-07-01 NOTE — Patient Instructions (Addendum)
We will be checking the following labs today BMET & CBC  Stay on your current medicines but we are adding back Lasix 80 mg to take just 1/2 (40 mg) each morning.  Restart potassium - Kdur 20 meq -  just one a day  See Dr. Meda Coffee in 2 to 3 weeks  Call the [place] office at [phone] if you have any questions, problems or concerns.

## 2014-07-04 ENCOUNTER — Telehealth: Payer: Self-pay

## 2014-07-04 NOTE — Telephone Encounter (Signed)
ok 

## 2014-07-04 NOTE — Telephone Encounter (Signed)
Physical Therapist Verdis Frederickson called wanting verbal orders for PT visits 2 times a week for 2 weeks and then 3 times a week for 4 weeks.   Please advise.  Thanks!

## 2014-07-04 NOTE — Telephone Encounter (Signed)
PT advised of note below and voiced understanding.

## 2014-07-05 ENCOUNTER — Telehealth: Payer: Self-pay | Admitting: Nurse Practitioner

## 2014-07-05 NOTE — Telephone Encounter (Signed)
New message ° ° ° ° ° °Returning a nurses call °

## 2014-07-12 DIAGNOSIS — I5043 Acute on chronic combined systolic (congestive) and diastolic (congestive) heart failure: Secondary | ICD-10-CM | POA: Diagnosis not present

## 2014-07-12 DIAGNOSIS — E1142 Type 2 diabetes mellitus with diabetic polyneuropathy: Secondary | ICD-10-CM

## 2014-07-12 DIAGNOSIS — I214 Non-ST elevation (NSTEMI) myocardial infarction: Secondary | ICD-10-CM | POA: Diagnosis not present

## 2014-07-12 DIAGNOSIS — I129 Hypertensive chronic kidney disease with stage 1 through stage 4 chronic kidney disease, or unspecified chronic kidney disease: Secondary | ICD-10-CM | POA: Diagnosis not present

## 2014-07-12 DIAGNOSIS — N183 Chronic kidney disease, stage 3 (moderate): Secondary | ICD-10-CM | POA: Diagnosis not present

## 2014-07-12 DIAGNOSIS — E11319 Type 2 diabetes mellitus with unspecified diabetic retinopathy without macular edema: Secondary | ICD-10-CM

## 2014-07-13 ENCOUNTER — Other Ambulatory Visit: Payer: Self-pay | Admitting: Endocrinology

## 2014-07-21 ENCOUNTER — Encounter: Payer: Self-pay | Admitting: Pulmonary Disease

## 2014-07-21 ENCOUNTER — Other Ambulatory Visit: Payer: Self-pay | Admitting: Endocrinology

## 2014-07-21 ENCOUNTER — Encounter (INDEPENDENT_AMBULATORY_CARE_PROVIDER_SITE_OTHER): Payer: Self-pay

## 2014-07-21 ENCOUNTER — Ambulatory Visit (INDEPENDENT_AMBULATORY_CARE_PROVIDER_SITE_OTHER): Payer: Medicare Other | Admitting: Pulmonary Disease

## 2014-07-21 VITALS — BP 132/72 | HR 65 | Temp 98.7°F | Ht 66.0 in | Wt 212.8 lb

## 2014-07-21 DIAGNOSIS — G4733 Obstructive sleep apnea (adult) (pediatric): Secondary | ICD-10-CM

## 2014-07-21 NOTE — Progress Notes (Signed)
   Subjective:    Patient ID: [name], female    DOB: June 12, 1930, 81 y.o.   MRN: [id number]  HPI Patient comes in today for follow-up of her severe obstructive sleep apnea. She is wearing C Pap compliantly, and is having no issues with her mask fit or pressure. He was having humidity issues, but her husband has made adjustments to her heater. He does not hear breakthrough snoring as long as she is wearing her device. She feels that she sleeps well, and has adequate daytime alertness.   Review of Systems  Constitutional: Negative for fever and unexpected weight change.  HENT: Negative for congestion, dental problem, ear pain, nosebleeds, postnasal drip, rhinorrhea, sinus pressure, sneezing, sore throat and trouble swallowing.   Eyes: Negative for redness and itching.  Respiratory: Negative for cough, chest tightness, shortness of breath and wheezing.   Cardiovascular: Negative for palpitations and leg swelling.  Gastrointestinal: Negative for nausea and vomiting.  Genitourinary: Negative for dysuria.  Musculoskeletal: Negative for joint swelling.  Skin: Negative for rash.  Neurological: Negative for headaches.  Hematological: Does not bruise/bleed easily.  Psychiatric/Behavioral: Negative for dysphoric mood. The patient is not nervous/anxious.        Objective:   Physical Exam Morbidly obese female in no acute distress Nose without purulence or discharge noted No skin breakdown or pressure necrosis from the C Pap mask Neck without lymphadenopathy or thyromegaly Lower extremities with edema on the right, artificial limb on the left. Alert and oriented, moves all 4 extremities.       Assessment & Plan:

## 2014-07-21 NOTE — Patient Instructions (Signed)
Continue with cpap, and keep up with mask changes and supplies. Work on weight loss followup with me again in one year if doing well.

## 2014-07-21 NOTE — Assessment & Plan Note (Signed)
The patient feels that she is doing very well with C Pap, and has been able to correct her humidity issues with adjustments of the heater.  She sleeps well with the device, and her husband has not heard breakthrough snoring.  I have asked her to keep up with her mask changes and supplies, and to work aggressively on weight loss. I will see her back in one year if she is doing well.

## 2014-07-22 ENCOUNTER — Other Ambulatory Visit: Payer: Self-pay | Admitting: Endocrinology

## 2014-07-24 ENCOUNTER — Other Ambulatory Visit: Payer: Self-pay | Admitting: Endocrinology

## 2014-07-25 NOTE — Telephone Encounter (Signed)
Please advise if ok to refill. Rx's are not listed on current medication list. Thanks!

## 2014-07-26 ENCOUNTER — Ambulatory Visit (INDEPENDENT_AMBULATORY_CARE_PROVIDER_SITE_OTHER): Payer: Medicare Other | Admitting: Nurse Practitioner

## 2014-07-26 ENCOUNTER — Encounter: Payer: Self-pay | Admitting: Nurse Practitioner

## 2014-07-26 VITALS — BP 150/58 | HR 62 | Ht 66.0 in | Wt 214.8 lb

## 2014-07-26 DIAGNOSIS — R931 Abnormal findings on diagnostic imaging of heart and coronary circulation: Secondary | ICD-10-CM

## 2014-07-26 DIAGNOSIS — Z01818 Encounter for other preprocedural examination: Secondary | ICD-10-CM

## 2014-07-26 DIAGNOSIS — I214 Non-ST elevation (NSTEMI) myocardial infarction: Secondary | ICD-10-CM

## 2014-07-26 DIAGNOSIS — I10 Essential (primary) hypertension: Secondary | ICD-10-CM

## 2014-07-26 DIAGNOSIS — R9439 Abnormal result of other cardiovascular function study: Secondary | ICD-10-CM

## 2014-07-26 NOTE — Progress Notes (Signed)
CARDIOLOGY OFFICE NOTE  Date:  07/26/2014    [name] Date of Birth: 21-Mar-1930 Medical Record #[id number]  PCP:  Renato Shin, MD  Cardiologist:  Meda Coffee    Chief Complaint  Patient presents with  . Pre-op Exam    Surgical clearance - seen for Dr. Meda Coffee     History of Present Illness: [name] is a 81 y.o. female who presents today for a pre op clearance visit. Seen for Dr. Meda Coffee.  She has a history of diastolic heart failure, prior nonobstructive CAD, DM2, HTN, HL, sleep apnea, PVD with prior L BKA and CKD (followed by Dr. Lorrene Reid).   Last office follow-up with Dr. Meda Coffee on 06/08/2014, patient complained of worsening lower extremity edema and progressive shortness of breath for the last 2-3 month. Of note, her Lasix was decreased in October due to worsening symptoms creatinine. She also complained of orthopnea or paroxysmal nocturnal dyspnea at the time. Dr. Meda Coffee had recommended a repeat nuclear stress test to assess for ischemia however, the study was not completed.   Patient presented to [facility] 2016 with complaint of chest pain radiating to her arm and associated shortness of breath, nausea and diaphoresis. She was ruled in for NTEMI, she was seen by cardiology team and that was subsequently transferred from [facility]. Myoview was initially obtained on 06/20/2014 for risk stratification which showed EF 44%, small mild reversible defect in the inferolateral wall which could be to breast attenuation versus ischemia, mild LV dysfunction with global hypokinesis, intermediate risk stress test finding. Plavix was added for NSTEMI. She had lots of issues with BP management and hyperkalemia while hospitalized. She did have cardiac cath which showed 70% stenosis in distal AV circumflex groove after takeoff of OM1, 20% oxygen LAD, 30% mid LAD, diffuse 50% proximal to mid RCA stenosis. LV gram was deferred given her high risk for contrast nephropathy.  Medical regiment was recommended for her distal circumflex stenosis given the close proximity to large caliber OM branch and high risk of occluding the OM branch with intervention. Per cath note, if she has recurrent angina symptom, high risk PCI of left circumflex artery stenosis can be considered. Echocardiogram obtained showed EF 50-55%, with grade 3 diastolic dysfunction. Post cath, patient has some contrast nephropathy with creatinine peaked on 1/8 with 2.13. She has had significant bradycardia but able to be discharged on low dose Bystolic. Spironolactone was deemed not a good choice for this patient with her intermittent hyperkalemia. Her Lasix continued to be held at discharge as well as ACE.   I saw her back last month - she had only been home a few days but doing ok. I added back her diuretic and potassium but at half dose.   Saw Dr. Gwenette Greet with pulmonary last week.   Comes in today. Here with her husband [name]. Needs multiple teeth extracted. Already discussed with Dr. Meda Coffee last week. She has continued to do well. Working with the therapist that come to her house. She walks with the walker. Her breathing is good. She has had no chest pain/discomfort. Not dizzy or lightheaded. She feels like she is doing ok.    Past Medical History  Diagnosis Date  . Uterine cancer   . Diabetes mellitus     type II; peripheral neuropathy  . Hypertension   . GERD (gastroesophageal reflux disease)   . Peripheral vascular disease     s/p L BKA 08/2012  . Obesity   .  Dyslipidemia   . Diastolic CHF, chronic     EF 50-55%, mild LVH and grade 1 diast. Dysfxn  . Osteoarthritis cervical spine   . DM retinopathy   . Sleep apnea     with CPAP  . History of shingles   . COPD (chronic obstructive pulmonary disease)   . Depression   . Peptic ulcer disease     duodenal  . Peptic stricture of esophagus   . Hiatal hernia   . Diverticulosis   . Arthritis   . ASCVD (arteriosclerotic cardiovascular  disease)     on MRI brin  . CAD (coronary artery disease)     a. s/p multiple caths with nonobs CAD;   b. cath 1/10: pLAD 20%, mLAD 40%, pCFX 20%, mCFX 40%, pRCA 60-70%;   c.  Myoview 06/02/12: Low anterior wall scar, no ischemia, EF 37%. d. cath 06/20/2014 70% mid LCx, medical therapy, high risk for PCI due to close proximity to large OM   . Cardiomyopathy     a. Echo 05/31/12: Severe LVH, EF 40-45%, diffuse HK, grade 1 diastolic dysfunction, mild to moderate calcified aortic valve annulus, mild to moderate aortic stenosis, MAC, mild MR, moderate LAE  . Asthma     Mild  . Pruritic condition     Idiopathic  . Hyperlipidemia   . Zoster   . Noncompliance   . CKD (chronic kidney disease)     Dr. Lorrene Reid    Past Surgical History  Procedure Laterality Date  . Tubal ligation  1967  . Knee arthroscopy  10/1998    Left  . Craniotomy  1997    Left for SDH  . Cataract extraction, bilateral  2005  . Hernia repair    . Esophagogastroduodenoscopy  04/04/2004  . Spine surgery      C-spine and lumbar surgery  . Cholecystectomy  2010  . Cardiac catheterization    . Dexa  7/05  . Amputation Left 09/04/2012    Procedure: AMPUTATION BELOW KNEE;  Surgeon: Angelia Mould, MD;  Location: [facility];  Service: Vascular;  Laterality: Left;  . Abdominal angiogram N/A 08/31/2012    Procedure: ABDOMINAL ANGIOGRAM with runoff poss intervention;  Surgeon: Angelia Mould, MD;  Location: [facility] CATH LAB;  Service: Cardiovascular;  Laterality: N/A;  . Tubal ligation    . Left heart catheterization with coronary angiogram N/A 06/22/2014    Procedure: LEFT HEART CATHETERIZATION WITH CORONARY ANGIOGRAM;  Surgeon: Burnell Blanks, MD;  Location: [facility] CATH LAB;  Service: Cardiovascular;  Laterality: N/A;     Medications: Current Outpatient Prescriptions  Medication Sig Dispense Refill  . acetaminophen (TYLENOL) 500 MG tablet Take 500 mg by mouth every 6 (six) hours as needed for pain. For pain    .  albuterol (PROVENTIL HFA;VENTOLIN HFA) 108 (90 BASE) MCG/ACT inhaler Inhale 2 puffs into the lungs every 4 (four) hours as needed for wheezing or shortness of breath. 1 Inhaler 3  . albuterol (PROVENTIL) (2.5 MG/3ML) 0.083% nebulizer solution Take 3 mLs (2.5 mg total) by nebulization every 4 (four) hours as needed for wheezing or shortness of breath. 75 mL 12  . allopurinol (ZYLOPRIM) 100 MG tablet TAKE 1 TABLET BY MOUTH DAILY 30 tablet 0  . amLODipine (NORVASC) 10 MG tablet Take 1 tablet (10 mg total) by mouth daily. 30 tablet 5  . aspirin 81 MG chewable tablet Chew 1 tablet (81 mg total) by mouth daily.    Marland Kitchen atorvastatin (LIPITOR) 40 MG tablet Take 1 tablet (  40 mg total) by mouth daily at 6 PM. 30 tablet 5  . bisacodyl (DULCOLAX) 5 MG EC tablet Take 1 tablet (5 mg total) by mouth daily as needed for moderate constipation. 30 tablet 0  . calcitRIOL (ROCALTROL) 0.25 MCG capsule TAKE ONE CAPSULE BY MOUTH EVERY DAY 30 capsule 0  . clopidogrel (PLAVIX) 75 MG tablet Take 1 tablet (75 mg total) by mouth daily. 30 tablet 11  . docusate sodium (COLACE) 100 MG capsule Take 100 mg by mouth 2 (two) times daily as needed for mild constipation (constipation).     Marland Kitchen donepezil (ARICEPT) 5 MG tablet Take 1 tablet (5 mg total) by mouth at bedtime. 30 tablet 11  . ezetimibe (ZETIA) 10 MG tablet Take 1 tablet (10 mg total) by mouth every morning. 30 tablet 11  . feeding supplement (GLUCERNA SHAKE) LIQD Take 237 mLs by mouth 2 (two) times daily.    . Fluticasone-Salmeterol (ADVAIR DISKUS) 100-50 MCG/DOSE AEPB Inhale 1 puff into the lungs 2 (two) times daily. 1 each 11  . furosemide (LASIX) 80 MG tablet Take 0.5 tablets (40 mg total) by mouth daily. 90 tablet 3  . gabapentin (NEURONTIN) 300 MG capsule TAKE 1 CAPSULE BY MOUTH AT BEDTIME 30 capsule 0  . hydrALAZINE (APRESOLINE) 25 MG tablet TAKE 1 TABLET BY MOUTH THREE TIMES DAILY 90 tablet 0  . HYDROcodone-acetaminophen (NORCO) 10-325 MG per tablet Take one tablet by  mouth every 4 hours as needed for pain 180 tablet 0  . insulin NPH-regular Human (NOVOLIN 70/30) (70-30) 100 UNIT/ML injection 18 units with breakfast, and 10 units with the evening meal. 10 mL 2  . isosorbide mononitrate (IMDUR) 60 MG 24 hr tablet Take 1 tablet (60 mg total) by mouth daily. 30 tablet 5  . labetalol (NORMODYNE) 100 MG tablet TAKE 1 TABLET BY MOUTH TWICE DAILY 60 tablet 0  . latanoprost (XALATAN) 0.005 % ophthalmic solution Place 1 drop into both eyes at bedtime.    . nebivolol (BYSTOLIC) 2.5 MG tablet Take 1 tablet (2.5 mg total) by mouth daily. 30 tablet 5  . nitroGLYCERIN (NITROSTAT) 0.4 MG SL tablet Place 0.4 mg under the tongue every 5 (five) minutes as needed for chest pain.     . ONE TOUCH ULTRA TEST test strip USE TO TEST BLOOD SUGAR TWICE DAILY 100 each 2  . pantoprazole (PROTONIX) 40 MG tablet TAKE 1 TABLET BY MOUTH TWICE DAILY 60 tablet 0  . polyethylene glycol (MIRALAX / GLYCOLAX) packet Take 17 g by mouth daily as needed for mild constipation. 14 each 0  . potassium chloride SA (K-DUR,KLOR-CON) 20 MEQ tablet Take 1 tablet (20 mEq total) by mouth daily. 90 tablet 3  . sertraline (ZOLOFT) 100 MG tablet TAKE 1 TABLET BY MOUTH EVERY DAY 30 tablet 0   No current facility-administered medications for this visit.    Allergies: Allergies  Allergen Reactions  . Iohexol      Code: RASH, Desc: [place] ON PT'S CHART ALLERGIC TO IV DYE 09/04/07/RM, Onset Date: [id number]     Social History: The patient  reports that she has never smoked. She has never used smokeless tobacco. She reports that she does not drink alcohol or use illicit drugs.   Family History: The patient's family history includes Asthma in her other; Cancer - Other in her mother; Diabetes in her mother and sister; Heart disease in her mother; Hypertension in her father and mother; Kidney disease in her paternal grandmother; Lymphoma in her father; Rheum arthritis  in her father and mother; Stomach cancer  in her mother.   Review of Systems: Please see the history of present illness.   Otherwise, the review of systems is positive for appetite change, DOE, & wheezing.   All other systems are reviewed and negative.   Physical Exam: VS:  BP 150/58 mmHg  Pulse 62  Ht 5\' 6"  (1.676 m)  Wt 214 lb 12.8 oz (97.433 kg)  BMI 34.69 kg/m2  SpO2 94% .  BMI Body mass index is 34.69 kg/(m^2).  Wt Readings from Last 3 Encounters:  07/26/14 214 lb 12.8 oz (97.433 kg)  07/21/14 212 lb 12.8 oz (96.525 kg)  07/01/14 214 lb 12.8 oz (97.433 kg)    General: Pleasant. Well developed, well nourished and in no acute distress. She is obese.  HEENT: Normal. Neck: Supple, no JVD, carotid bruits, or masses noted.  Cardiac: Regular rate and rhythm. No murmurs, rubs, or gallops. No edema.  Respiratory:  Lungs are clear to auscultation bilaterally with normal work of breathing.  GI: Soft and nontender.  MS: No deformity or atrophy. Gait not tested. Prosthetic left leg. Right leg with just trace edema.  Skin: Warm and dry. Color is normal.  Neuro:  Strength and sensation are intact and no gross focal deficits noted.  Psych: Alert, appropriate and with normal affect.   LABORATORY DATA:  EKG:  EKG is ordered today. This demonstrates NSR - septal Q's, diffuse ST and T wave changes. Unchanged.   Lab Results  Component Value Date   WBC 8.5 07/01/2014   HGB 13.0 07/01/2014   HCT 41.2 07/01/2014   PLT 331.0 07/01/2014   GLUCOSE 92 07/01/2014   CHOL 139 01/08/2013   TRIG 173.0* 01/08/2013   HDL 53.00 01/08/2013   LDLDIRECT 123.0 07/06/2010   LDLCALC 51 01/08/2013   ALT 22 06/17/2014   AST 34 06/17/2014   NA 142 07/01/2014   K 4.1 07/01/2014   CL 109 07/01/2014   CREATININE 1.54* 07/01/2014   BUN 31* 07/01/2014   CO2 27 07/01/2014   TSH 2.626 06/17/2014   INR 1.18 06/18/2014   HGBA1C 6.7* 06/17/2014   MICROALBUR 3.2* 01/08/2013    BNP (last 3 results)  Recent Labs  06/17/14 1330 06/17/14 1946    BNP 1951.5* 1985.7*    ProBNP (last 3 results)  Recent Labs  11/15/13 1205 02/17/14 1202 06/08/14 1617  PROBNP 158.0* 768.0* 1249.0*     Other Studies Reviewed Today:  Echo Study Conclusions from 06/2014  - Left ventricle: The cavity size was normal. There was moderate concentric hypertrophy. Systolic function was normal. The estimated ejection fraction was in the range of 50% to 55%. Wall motion was normal; there were no regional wall motion abnormalities. Doppler parameters are consistent with a reversible restrictive pattern, indicative of decreased left ventricular diastolic compliance and/or increased left atrial pressure (grade 3 diastolic dysfunction). - Aortic valve: Cusp separation was reduced. - Mitral valve: Moderately calcified annulus. - Left atrium: The atrium was mildly dilated.   Cardiac Catheterization Operative Report  [name] [id number] 1/6/20164:19 PM Renato Shin, MD  Procedure Performed:  1. Left Heart Catheterization 2. Selective Coronary Angiography  Operator: Lauree Chandler, MD  Arterial access site: Left radial artery.   Indication: 81 yo female with known CAD admitted with hypertensive crisis, elevated troponin with flat trend. Stress myoview with possible inferolateral ischemia. Pt with renal insufficiency. Plans today for diagnostic only cath to define CAD.   Procedure Details: The risks, benefits, complications,  treatment options, and expected outcomes were discussed with the patient. The patient and/or family concurred with the proposed plan, giving informed consent. The patient was brought to the cath lab after IV hydration was begun and oral premedication was given. The patient was further sedated with Versed and Fentanyl. The left wrist was assessed with a modified Allens test which was positive. The left wrist was prepped and draped in a sterile fashion. 1%  lidocaine was used for local anesthesia. Using the modified Seldinger access technique, a 5 French sheath was placed in the left radial artery. 3 mg Verapamil was given through the sheath. 5000 units IV heparin was given. Standard diagnostic catheters were used to perform selective coronary angiography. The JR4 catheter was used to cross the aortic valve and LV pressures were measured. The sheath was removed from the right radial artery and a Terumo hemostasis band was applied at the arteriotomy site on the right wrist.   There were no immediate complications. The patient was taken to the recovery area in stable condition.   Hemodynamic Findings: Central aortic pressure: 144/56 Left ventricular pressure: 141/13/23  Angiographic Findings:  Left main: No obstructive disease.   Left Anterior Descending Artery: Large caliber vessel that courses to the apex. The proximal and mid vessel is calcified. The proximal vessel has diffuse 20% stenosis. The mid vessel has diffuse 30% stenosis. The distal vessel has mild plaque. The small to moderate caliber diagonal branch has ostial 30% stenosis.   Circumflex Artery: Large caliber vessel with large caliber obtuse marginal branch. The distal AV groove Circumflex has a 70% stenosis just after the takeoff of OM1. (this has progressed from the prior cath).   Right Coronary Artery: Large dominant vessel with diffuse 50% proximal to mid stenosis. Appears unchanged from last cath.   Left Ventricular Angiogram: Deferred.   45cc contrast used.   Impression: 1. Moderate non-obstructive disease in the RCA and LAD 2. Moderately severe distal Circumflex artery stenosis  Recommendations: I would recommend medical management of her distal Circumflex stenosis. The lesion is just past the takeoff of the large caliber OM branch and PCI would potentially compromise flow into the larger OM branch. Given her renal insufficiency, will hydrate aggressively post-cath. If  she continues to have angina despite optimal medical therapy, could consider PCI of the distal AV groove Circumflex stenosis.    Complications: None. The patient tolerated the procedure well.      ASSESSMENT AND PLAN: 1. NSTEMI - to manage medically. She is doing well clinically. She has no symptoms. Would continue with current regimen.  2. HTN - BP fair. Would continue with her current regimen.  3. CKD - holding off on restarting her ACE due to last BMET  4. Bradycardia - on low dose Bystolic.   5. Diastolic HF - looks fairly well compensated. I have left her on her current regimen.   6. Pre op clearance for teeth extraction - her cardiac status is felt to be stable at this time. Would hold her Plavix for the next 5 days - resume after her surgery on Monday if ok with oral surgery. We would like to keep her on aspirin therapy -  uninterrupted.   Current medicines are reviewed with the patient today.  The patient does not have concerns regarding medicines other than what has been noted above.  The following changes have been made:  See above.  Labs/ tests ordered today include:    Orders Placed This Encounter  Procedures  .  EKG 12-Lead     Disposition:   FU with Dr. Meda Coffee in 3 months.   Patient is agreeable to this plan and will call if any problems develop in the interim.   Signed: Burtis Junes, RN, ANP-C 07/26/2014 3:12 PM  [facility] [address] [place], [place]  [address] Phone: [phone] Fax: [phone]

## 2014-07-26 NOTE — Patient Instructions (Addendum)
You may proceed on with your surgery Monday to get your teeth extracted.  Stop your Plavix as of now - do not take until after your surgery  Stay on aspirin  Stay on your other current medicines  See Dr. Meda Coffee in 3 months  Call the [place] office at [phone] if you have any questions, problems or concerns.

## 2014-07-27 ENCOUNTER — Encounter (HOSPITAL_COMMUNITY)
Admission: RE | Admit: 2014-07-27 | Discharge: 2014-07-27 | Disposition: A | Discharge status: Home / Self Care | Payer: Medicare Other | Source: Ambulatory Visit | Attending: Oral and Maxillofacial Surgery | Admitting: Oral and Maxillofacial Surgery

## 2014-07-27 ENCOUNTER — Encounter (HOSPITAL_COMMUNITY): Payer: Self-pay

## 2014-07-27 DIAGNOSIS — E669 Obesity, unspecified: Secondary | ICD-10-CM | POA: Insufficient documentation

## 2014-07-27 DIAGNOSIS — K117 Disturbances of salivary secretion: Secondary | ICD-10-CM | POA: Diagnosis not present

## 2014-07-27 DIAGNOSIS — K029 Dental caries, unspecified: Secondary | ICD-10-CM | POA: Diagnosis not present

## 2014-07-27 DIAGNOSIS — E1142 Type 2 diabetes mellitus with diabetic polyneuropathy: Secondary | ICD-10-CM | POA: Diagnosis not present

## 2014-07-27 DIAGNOSIS — K219 Gastro-esophageal reflux disease without esophagitis: Secondary | ICD-10-CM | POA: Diagnosis not present

## 2014-07-27 DIAGNOSIS — E785 Hyperlipidemia, unspecified: Secondary | ICD-10-CM | POA: Insufficient documentation

## 2014-07-27 DIAGNOSIS — J45909 Unspecified asthma, uncomplicated: Secondary | ICD-10-CM | POA: Diagnosis not present

## 2014-07-27 DIAGNOSIS — Z89512 Acquired absence of left leg below knee: Secondary | ICD-10-CM | POA: Diagnosis not present

## 2014-07-27 DIAGNOSIS — I739 Peripheral vascular disease, unspecified: Secondary | ICD-10-CM | POA: Diagnosis not present

## 2014-07-27 DIAGNOSIS — J449 Chronic obstructive pulmonary disease, unspecified: Secondary | ICD-10-CM | POA: Insufficient documentation

## 2014-07-27 DIAGNOSIS — I10 Essential (primary) hypertension: Secondary | ICD-10-CM | POA: Insufficient documentation

## 2014-07-27 DIAGNOSIS — Z01812 Encounter for preprocedural laboratory examination: Secondary | ICD-10-CM | POA: Diagnosis present

## 2014-07-27 DIAGNOSIS — I5032 Chronic diastolic (congestive) heart failure: Secondary | ICD-10-CM | POA: Diagnosis not present

## 2014-07-27 LAB — CBC
HCT: 41.9 % (ref 36.0–46.0)
HEMOGLOBIN: 13.1 g/dL (ref 12.0–15.0)
MCH: 27.6 pg (ref 26.0–34.0)
MCHC: 31.3 g/dL (ref 30.0–36.0)
MCV: 88.2 fL (ref 78.0–100.0)
Platelets: 259 10*3/uL (ref 150–400)
RBC: 4.75 MIL/uL (ref 3.87–5.11)
RDW: 15.1 % (ref 11.5–15.5)
WBC: 7 10*3/uL (ref 4.0–10.5)

## 2014-07-27 LAB — BASIC METABOLIC PANEL
ANION GAP: 11 (ref 5–15)
BUN: 23 mg/dL (ref 6–23)
CALCIUM: 8.9 mg/dL (ref 8.4–10.5)
CO2: 25 mmol/L (ref 19–32)
CREATININE: 1.52 mg/dL — AB (ref 0.50–1.10)
Chloride: 106 mmol/L (ref 96–112)
GFR calc non Af Amer: 31 mL/min — ABNORMAL LOW (ref >90)
GFR, EST AFRICAN AMERICAN: 36 mL/min — AB (ref >90)
Glucose, Bld: 82 mg/dL (ref 70–99)
Potassium: 3.6 mmol/L (ref 3.5–5.1)
Sodium: 142 mmol/L (ref 135–145)

## 2014-07-27 NOTE — H&P (Signed)
[name] is an 81 y.o. female.   Chief Complaint: "Extractions"  HPI: The patient is a 81 year old female that needs extraction of #7, 8, 9, 10, 11, and 19.  She is being taken to the OR for extraction of multiple co-morbidities.      PMHx:  Past Medical History  Diagnosis Date  . Uterine cancer   . Diabetes mellitus     type II; peripheral neuropathy  . Hypertension   . GERD (gastroesophageal reflux disease)   . Peripheral vascular disease     s/p L BKA 08/2012  . Obesity   . Dyslipidemia   . Diastolic CHF, chronic     EF 50-55%, mild LVH and grade 1 diast. Dysfxn  . Osteoarthritis cervical spine   . DM retinopathy   . Sleep apnea     with CPAP  . History of shingles   . COPD (chronic obstructive pulmonary disease)   . Depression   . Peptic ulcer disease     duodenal  . Peptic stricture of esophagus   . Hiatal hernia   . Diverticulosis   . Arthritis   . ASCVD (arteriosclerotic cardiovascular disease)     on MRI brin  . CAD (coronary artery disease)     a. s/p multiple caths with nonobs CAD;   b. cath 1/10: pLAD 20%, mLAD 40%, pCFX 20%, mCFX 40%, pRCA 60-70%;   c.  Myoview 06/02/12: Low anterior wall scar, no ischemia, EF 37%. d. cath 06/20/2014 70% mid LCx, medical therapy, high risk for PCI due to close proximity to large OM   . Cardiomyopathy     a. Echo 05/31/12: Severe LVH, EF 40-45%, diffuse HK, grade 1 diastolic dysfunction, mild to moderate calcified aortic valve annulus, mild to moderate aortic stenosis, MAC, mild MR, moderate LAE  . Asthma     Mild  . Pruritic condition     Idiopathic  . Hyperlipidemia   . Zoster   . Noncompliance   . CKD (chronic kidney disease)     Dr. Lorrene Reid    PSx:  Past Surgical History  Procedure Laterality Date  . Tubal ligation  1967  . Knee arthroscopy  10/1998    Left  . Craniotomy  1997    Left for SDH  . Cataract extraction, bilateral  2005  . Hernia repair    . Esophagogastroduodenoscopy  04/04/2004  . Spine  surgery      C-spine and lumbar surgery  . Cholecystectomy  2010  . Cardiac catheterization    . Dexa  7/05  . Amputation Left 09/04/2012    Procedure: AMPUTATION BELOW KNEE;  Surgeon: Angelia Mould, MD;  Location: [place];  Service: Vascular;  Laterality: Left;  . Abdominal angiogram N/A 08/31/2012    Procedure: ABDOMINAL ANGIOGRAM with runoff poss intervention;  Surgeon: Angelia Mould, MD;  Location: [facility] CATH LAB;  Service: Cardiovascular;  Laterality: N/A;  . Tubal ligation    . Left heart catheterization with coronary angiogram N/A 06/22/2014    Procedure: LEFT HEART CATHETERIZATION WITH CORONARY ANGIOGRAM;  Surgeon: Burnell Blanks, MD;  Location: [facility] CATH LAB;  Service: Cardiovascular;  Laterality: N/A;  . Eye surgery    . Brain surgery      1997 blood clot on the brain, then had to relieve fluid on the brain    Family Hx:  Family History  Problem Relation Age of Onset  . Cancer - Other Mother     "Stomach" Cancer  .  Diabetes Mother   . Heart disease Mother   . Stomach cancer Mother   . Hypertension Mother   . Lymphoma Father   . Hypertension Father   . Kidney disease Paternal Grandmother   . Asthma Other   . Diabetes Sister   . Rheum arthritis Mother   . Rheum arthritis Father     Social History:  reports that she has never smoked. She has never used smokeless tobacco. She reports that she does not drink alcohol or use illicit drugs.  Allergies:  Allergies  Allergen Reactions  . Iohexol      Code: RASH, Desc: [place] ON PT'S CHART ALLERGIC TO IV DYE 09/04/07/RM, Onset Date: [id number]     Meds:  No prescriptions prior to admission    Labs: No results found for this or any previous visit (from the past 48 hour(s)).  Radiology: No results found.  ROS: Pertinent items are noted in HPI.  Vitals: There were no vitals taken for this visit.  Physical Exam: General appearance: alert and cooperative Head: Normocephalic, without obvious  abnormality, atraumatic Eyes: conjunctivae/corneas clear. PERRL, EOM's intact. Fundi benign. Ears: normal TM's and external ear canals both ears Nose: Nares normal. Septum midline. Mucosa normal. No drainage or sinus tenderness. Throat: lips, mucosa, and tongue normal; teeth and gums normal Resp: clear to auscultation bilaterally Chest wall: no tenderness Cardio: regular rate and rhythm, S1, S2 normal, no murmur, click, rub or gallop Extremities: left leg amputation Neurologic: Grossly normal  Assessment/Plan The patient is an 81 year female that requires extraction of #7, 8, 9, 10, 11 and 19 in the OR due to dental caries.  [place] Krotz L  07/27/2014, 3:26 PM

## 2014-07-27 NOTE — Pre-Procedure Instructions (Addendum)
[name]  07/27/2014   Your procedure is scheduled on:  Monday, Feb. 15th   Report to [facility] Admitting at 6:30 AM.  Call this number if you have problems the morning of surgery: [phone]   Remember:   Do not eat food or drink liquids after midnight Sunday.    Take these medicines the morning of surgery with A SIP OF WATER: Norvasc, Labetalol, Nebivolol, Protonix, Zoloft, Isosorbide, Hydrocodone.  Please use your inhalers and/or nebulizer treatment the morning of surgery.              DO NOT TAKE any diabetes medication the morning of surgery.   Do not wear jewelry, make-up or nail polish.  Do not wear lotions, powders, or perfumes. You may NOT wear deodorant the day of surgery..  Do not shave underarms & legs 48 hours prior to surgery.    Do not bring valuables to the hospital.  [facility] is not responsible for any belongings or valuables.               Contacts, dentures or bridgework may not be worn into surgery.  Leave suitcase in the car. After surgery it may be brought to your room.  For patients admitted to the hospital, discharge time is determined by your treatment team.    Name and phone number of your driver:    Special Instructions: "Preparing for Surgery" instruction sheet.   Please read over the following fact sheets that you were given: Pain Management, Surgical Site Infection Prevention.

## 2014-07-27 NOTE — Progress Notes (Signed)
Patient was complaining back in January that her stomach was hurting for a couple of days, spouse brought her to ER and they told him that she was having 'heart attack'.  Sees Dr. Meda Coffee @ [facility].  Truitt Merle, NP saw her in the office and her note is inside chart.  Heart is stable per the patient.  Left leg amputation in 2014 ...evidentally was suppose to have bypass surgery to that leg, but found that "the vessels no good" so, instead of taking the chance of gangrene, the leg was removed by Dr. Scot Dock.

## 2014-07-29 ENCOUNTER — Telehealth: Payer: Self-pay | Admitting: Endocrinology

## 2014-07-29 NOTE — Telephone Encounter (Signed)
Verdis Frederickson from [facility] (physical Therapist) called ask if Dr Loanne Drilling could extend home health care for patient for three week two times a week.

## 2014-07-31 MED ORDER — CEFAZOLIN SODIUM-DEXTROSE 2-3 GM-% IV SOLR
2.0000 g | INTRAVENOUS | Status: AC
  Administered 2014-08-01: 2 g via INTRAVENOUS
  Filled 2014-07-31: qty 50

## 2014-07-31 NOTE — Anesthesia Preprocedure Evaluation (Addendum)
Anesthesia Evaluation  Patient identified by MRN, date of birth, ID band Patient awake    Reviewed: Allergy & Precautions, NPO status , Patient's Chart, lab work & pertinent test results, reviewed documented beta blocker date and time   History of Anesthesia Complications (+) DIFFICULT AIRWAY  Airway Mallampati: II   Neck ROM: Full    Dental  (+) Missing, Poor Dentition   Pulmonary shortness of breath, asthma , sleep apnea and Continuous Positive Airway Pressure Ventilation , COPD COPD inhaler,  breath sounds clear to auscultation        Cardiovascular hypertension, Pt. on medications and Pt. on home beta blockers + CAD, + Past MI, + Peripheral Vascular Disease and +CHF (grade 3 diastolic failure) Rhythm:Regular  NSTEMI 06/26/2014, CATH with 70% distal circ not ideal for STENT, Seen and cleared by cardiology, CAD to be managed medically, to hold plavix x 5 days, continue asprin, EF on ECHO 06/2014 55%   Neuro/Psych Depression  Neuromuscular disease    GI/Hepatic hiatal hernia, PUD, GERD-  Medicated,  Endo/Other  diabetes, Insulin Dependent  Renal/GU Renal InsufficiencyRenal diseaseGFR 33     Musculoskeletal   Abdominal (+)  Abdomen: soft.    Peds  Hematology 13/41   Anesthesia Other Findings Pt with h/o difficult intubation x 3 attempts.  Video laryngoscope at bedside.  Emergency equipment available.  Dr. Buelah Manis requests nasal ETT.  Waldron Session, CRNA  Reproductive/Obstetrics                         Anesthesia Physical Anesthesia Plan  ASA: IV  Anesthesia Plan: General   Post-op Pain Management:    Induction: Intravenous  Airway Management Planned: Oral ETT  Additional Equipment:   Intra-op Plan:   Post-operative Plan: Extubation in OR  Informed Consent: I have reviewed the patients History and Physical, chart, labs and discussed the procedure including the risks, benefits and alternatives  for the proposed anesthesia with the patient or authorized representative who has indicated his/her understanding and acceptance.     Plan Discussed with:   Anesthesia Plan Comments: (Oral tube in view of asprin and history of plavix, has been cleared by cardiology and aware of NSTEMI 06/26/2014, both CATH and ECHO done since MI, IV Tylenol)       Anesthesia Quick Evaluation

## 2014-08-01 ENCOUNTER — Ambulatory Visit (HOSPITAL_COMMUNITY): Payer: Medicare Other | Admitting: Certified Registered"

## 2014-08-01 ENCOUNTER — Encounter (HOSPITAL_COMMUNITY)
Admission: RE | Disposition: A | Discharge status: Home / Self Care | Payer: Self-pay | Source: Ambulatory Visit | Attending: Oral and Maxillofacial Surgery

## 2014-08-01 ENCOUNTER — Ambulatory Visit (HOSPITAL_COMMUNITY)
Admission: RE | Admit: 2014-08-01 | Discharge: 2014-08-01 | Disposition: A | Discharge status: Home / Self Care | Payer: Medicare Other | Source: Ambulatory Visit | Attending: Oral and Maxillofacial Surgery | Admitting: Oral and Maxillofacial Surgery

## 2014-08-01 ENCOUNTER — Encounter (HOSPITAL_COMMUNITY): Payer: Self-pay | Admitting: Certified Registered"

## 2014-08-01 DIAGNOSIS — R682 Dry mouth, unspecified: Secondary | ICD-10-CM

## 2014-08-01 DIAGNOSIS — J45909 Unspecified asthma, uncomplicated: Secondary | ICD-10-CM | POA: Insufficient documentation

## 2014-08-01 DIAGNOSIS — I2510 Atherosclerotic heart disease of native coronary artery without angina pectoris: Secondary | ICD-10-CM | POA: Insufficient documentation

## 2014-08-01 DIAGNOSIS — Z8542 Personal history of malignant neoplasm of other parts of uterus: Secondary | ICD-10-CM | POA: Insufficient documentation

## 2014-08-01 DIAGNOSIS — E1142 Type 2 diabetes mellitus with diabetic polyneuropathy: Secondary | ICD-10-CM | POA: Diagnosis not present

## 2014-08-01 DIAGNOSIS — N189 Chronic kidney disease, unspecified: Secondary | ICD-10-CM | POA: Insufficient documentation

## 2014-08-01 DIAGNOSIS — E785 Hyperlipidemia, unspecified: Secondary | ICD-10-CM | POA: Insufficient documentation

## 2014-08-01 DIAGNOSIS — Z9989 Dependence on other enabling machines and devices: Secondary | ICD-10-CM | POA: Diagnosis not present

## 2014-08-01 DIAGNOSIS — I5032 Chronic diastolic (congestive) heart failure: Secondary | ICD-10-CM | POA: Diagnosis not present

## 2014-08-01 DIAGNOSIS — I739 Peripheral vascular disease, unspecified: Secondary | ICD-10-CM | POA: Insufficient documentation

## 2014-08-01 DIAGNOSIS — K029 Dental caries, unspecified: Secondary | ICD-10-CM | POA: Diagnosis not present

## 2014-08-01 DIAGNOSIS — E669 Obesity, unspecified: Secondary | ICD-10-CM | POA: Diagnosis not present

## 2014-08-01 DIAGNOSIS — M479 Spondylosis, unspecified: Secondary | ICD-10-CM | POA: Insufficient documentation

## 2014-08-01 DIAGNOSIS — I252 Old myocardial infarction: Secondary | ICD-10-CM | POA: Diagnosis not present

## 2014-08-01 DIAGNOSIS — Z8249 Family history of ischemic heart disease and other diseases of the circulatory system: Secondary | ICD-10-CM | POA: Insufficient documentation

## 2014-08-01 DIAGNOSIS — J449 Chronic obstructive pulmonary disease, unspecified: Secondary | ICD-10-CM | POA: Diagnosis not present

## 2014-08-01 DIAGNOSIS — Z6834 Body mass index (BMI) 34.0-34.9, adult: Secondary | ICD-10-CM | POA: Insufficient documentation

## 2014-08-01 DIAGNOSIS — E11319 Type 2 diabetes mellitus with unspecified diabetic retinopathy without macular edema: Secondary | ICD-10-CM | POA: Diagnosis not present

## 2014-08-01 DIAGNOSIS — K117 Disturbances of salivary secretion: Secondary | ICD-10-CM | POA: Insufficient documentation

## 2014-08-01 DIAGNOSIS — Z8711 Personal history of peptic ulcer disease: Secondary | ICD-10-CM | POA: Diagnosis not present

## 2014-08-01 DIAGNOSIS — I429 Cardiomyopathy, unspecified: Secondary | ICD-10-CM | POA: Insufficient documentation

## 2014-08-01 DIAGNOSIS — K219 Gastro-esophageal reflux disease without esophagitis: Secondary | ICD-10-CM | POA: Diagnosis not present

## 2014-08-01 DIAGNOSIS — I129 Hypertensive chronic kidney disease with stage 1 through stage 4 chronic kidney disease, or unspecified chronic kidney disease: Secondary | ICD-10-CM | POA: Diagnosis not present

## 2014-08-01 DIAGNOSIS — Z794 Long term (current) use of insulin: Secondary | ICD-10-CM | POA: Diagnosis not present

## 2014-08-01 DIAGNOSIS — G4730 Sleep apnea, unspecified: Secondary | ICD-10-CM | POA: Insufficient documentation

## 2014-08-01 DIAGNOSIS — F329 Major depressive disorder, single episode, unspecified: Secondary | ICD-10-CM | POA: Diagnosis not present

## 2014-08-01 DIAGNOSIS — Z825 Family history of asthma and other chronic lower respiratory diseases: Secondary | ICD-10-CM | POA: Insufficient documentation

## 2014-08-01 DIAGNOSIS — Z8261 Family history of arthritis: Secondary | ICD-10-CM | POA: Insufficient documentation

## 2014-08-01 HISTORY — PX: MULTIPLE EXTRACTIONS WITH ALVEOLOPLASTY: SHX5342

## 2014-08-01 LAB — GLUCOSE, CAPILLARY
GLUCOSE-CAPILLARY: 61 mg/dL — AB (ref 70–99)
GLUCOSE-CAPILLARY: 242 mg/dL — AB (ref 70–99)
Glucose-Capillary: 105 mg/dL — ABNORMAL HIGH (ref 70–99)
Glucose-Capillary: 104 mg/dL — ABNORMAL HIGH (ref 70–99)

## 2014-08-01 SURGERY — MULTIPLE EXTRACTION WITH ALVEOLOPLASTY
Anesthesia: General | Site: Mouth

## 2014-08-01 MED ORDER — 0.9 % SODIUM CHLORIDE (POUR BTL) OPTIME
TOPICAL | Status: DC | PRN
  Administered 2014-08-01: 1000 mL

## 2014-08-01 MED ORDER — DEXTROSE-NACL 5-0.45 % IV SOLN
INTRAVENOUS | Status: DC
  Administered 2014-08-01: 10 mL/h via INTRAVENOUS

## 2014-08-01 MED ORDER — ONDANSETRON HCL 4 MG/2ML IJ SOLN
INTRAMUSCULAR | Status: DC | PRN
  Administered 2014-08-01: 4 mg via INTRAVENOUS

## 2014-08-01 MED ORDER — PROMETHAZINE HCL 25 MG/ML IJ SOLN: 6.2500 mg | INTRAMUSCULAR | Status: DC | PRN

## 2014-08-01 MED ORDER — NEOSTIGMINE METHYLSULFATE 10 MG/10ML IV SOLN
INTRAVENOUS | Status: DC | PRN
  Administered 2014-08-01: 2 mg via INTRAVENOUS

## 2014-08-01 MED ORDER — EPHEDRINE SULFATE 50 MG/ML IJ SOLN
INTRAMUSCULAR | Status: DC | PRN
  Administered 2014-08-01: 15 mg via INTRAVENOUS

## 2014-08-01 MED ORDER — ROCURONIUM BROMIDE 100 MG/10ML IV SOLN
INTRAVENOUS | Status: DC | PRN
  Administered 2014-08-01: 10 mg via INTRAVENOUS

## 2014-08-01 MED ORDER — OXYMETAZOLINE HCL 0.05 % NA SOLN
NASAL | Status: AC
  Filled 2014-08-01: qty 15

## 2014-08-01 MED ORDER — OXYMETAZOLINE HCL 0.05 % NA SOLN
NASAL | Status: DC | PRN
  Administered 2014-08-01: 2 via NASAL

## 2014-08-01 MED ORDER — BUPIVACAINE-EPINEPHRINE 0.5% -1:200000 IJ SOLN
INTRAMUSCULAR | Status: DC | PRN
  Administered 2014-08-01: 7.2 mL

## 2014-08-01 MED ORDER — PROPOFOL 10 MG/ML IV BOLUS
INTRAVENOUS | Status: DC | PRN
  Administered 2014-08-01: 100 mg via INTRAVENOUS

## 2014-08-01 MED ORDER — ROCURONIUM BROMIDE 50 MG/5ML IV SOLN
INTRAVENOUS | Status: AC
  Filled 2014-08-01: qty 1

## 2014-08-01 MED ORDER — SUCCINYLCHOLINE CHLORIDE 20 MG/ML IJ SOLN
INTRAMUSCULAR | Status: DC | PRN
  Administered 2014-08-01: 100 mg via INTRAVENOUS

## 2014-08-01 MED ORDER — DEXTROSE 50 % IV SOLN
25.0000 mL | Freq: Once | INTRAVENOUS | Status: AC
  Administered 2014-08-01: 25 mL via INTRAVENOUS

## 2014-08-01 MED ORDER — MEPERIDINE HCL 25 MG/ML IJ SOLN: 6.2500 mg | INTRAMUSCULAR | Status: DC | PRN

## 2014-08-01 MED ORDER — DEXTROSE 50 % IV SOLN
INTRAVENOUS | Status: AC
  Filled 2014-08-01: qty 50

## 2014-08-01 MED ORDER — FENTANYL CITRATE 0.05 MG/ML IJ SOLN
INTRAMUSCULAR | Status: AC
  Filled 2014-08-01: qty 5

## 2014-08-01 MED ORDER — FENTANYL CITRATE 0.05 MG/ML IJ SOLN
INTRAMUSCULAR | Status: DC | PRN
  Administered 2014-08-01: 100 ug via INTRAVENOUS

## 2014-08-01 MED ORDER — LIDOCAINE HCL (CARDIAC) 20 MG/ML IV SOLN
INTRAVENOUS | Status: DC | PRN
Start: 2014-08-01 — End: 2014-08-01
  Administered 2014-08-01: 100 mg via INTRAVENOUS

## 2014-08-01 MED ORDER — OXYMETAZOLINE HCL 0.05 % NA SOLN
NASAL | Status: DC | PRN
  Administered 2014-08-01: 1 via NASAL

## 2014-08-01 MED ORDER — GLYCOPYRROLATE 0.2 MG/ML IJ SOLN
INTRAMUSCULAR | Status: DC | PRN
  Administered 2014-08-01: 0.4 mg via INTRAVENOUS

## 2014-08-01 MED ORDER — LIDOCAINE-EPINEPHRINE 2 %-1:100000 IJ SOLN
INTRAMUSCULAR | Status: AC
  Filled 2014-08-01: qty 5.1

## 2014-08-01 MED ORDER — DEXAMETHASONE SODIUM PHOSPHATE 10 MG/ML IJ SOLN
INTRAMUSCULAR | Status: DC | PRN
  Administered 2014-08-01: 8 mg via INTRAVENOUS

## 2014-08-01 MED ORDER — LACTATED RINGERS IV SOLN
INTRAVENOUS | Status: DC | PRN
  Administered 2014-08-01: 09:00:00 via INTRAVENOUS

## 2014-08-01 MED ORDER — FENTANYL CITRATE 0.05 MG/ML IJ SOLN: 25.0000 ug | INTRAMUSCULAR | Status: DC | PRN

## 2014-08-01 MED ORDER — PROPOFOL 10 MG/ML IV BOLUS
INTRAVENOUS | Status: AC
  Filled 2014-08-01: qty 20

## 2014-08-01 MED ORDER — LIDOCAINE HCL (PF) 2 % IJ SOLN
INTRAMUSCULAR | Status: DC | PRN
  Administered 2014-08-01: 10.2 mL via INTRADERMAL

## 2014-08-01 SURGICAL SUPPLY — 47 items
ATTRACTOMAT 16X20 MAGNETIC DRP (DRAPES) ×2 IMPLANT
BLADE SURG 15 STRL LF DISP TIS (BLADE) ×2 IMPLANT
BLADE SURG 15 STRL SS (BLADE) ×4
BUR CROSS CUT FISSURE 1.6 (BURR) ×1 IMPLANT
BUR EGG ELITE 4.0 (BURR) ×1 IMPLANT
BUR RND FLUTED 2.5 (BURR) IMPLANT
CANISTER SUCTION 2500CC (MISCELLANEOUS) ×2 IMPLANT
CLEANER TIP ELECTROSURG 2X2 (MISCELLANEOUS) IMPLANT
COVER SURGICAL LIGHT HANDLE (MISCELLANEOUS) ×2 IMPLANT
ELECT NDL BLADE 2-5/6 (NEEDLE) IMPLANT
ELECT NEEDLE BLADE 2-5/6 (NEEDLE) IMPLANT
ELECT REM PT RETURN 9FT ADLT (ELECTROSURGICAL)
ELECTRODE REM PT RTRN 9FT ADLT (ELECTROSURGICAL) IMPLANT
GAUZE PACKING FOLDED 2  STR (GAUZE/BANDAGES/DRESSINGS) ×1
GAUZE PACKING FOLDED 2 STR (GAUZE/BANDAGES/DRESSINGS) ×1 IMPLANT
GAUZE SPONGE 4X4 12PLY STRL (GAUZE/BANDAGES/DRESSINGS) ×1 IMPLANT
GAUZE SPONGE 4X4 16PLY XRAY LF (GAUZE/BANDAGES/DRESSINGS) ×2 IMPLANT
GLOVE BIO SURGEON STRL SZ 6.5 (GLOVE) ×1 IMPLANT
GLOVE BIOGEL PI IND STRL 6.5 (GLOVE) IMPLANT
GLOVE BIOGEL PI IND STRL 7.5 (GLOVE) ×1 IMPLANT
GLOVE BIOGEL PI INDICATOR 6.5 (GLOVE)
GLOVE BIOGEL PI INDICATOR 7.5 (GLOVE) ×1
GLOVE ORTHO TXT STRL SZ7.5 (GLOVE) ×2 IMPLANT
GLOVE SS BIOGEL STRL SZ 7.5 (GLOVE) IMPLANT
GLOVE SUPERSENSE BIOGEL SZ 7.5 (GLOVE) ×1
GOWN STRL REUS W/ TWL LRG LVL3 (GOWN DISPOSABLE) ×2 IMPLANT
GOWN STRL REUS W/TWL LRG LVL3 (GOWN DISPOSABLE) ×4
KIT BASIN OR (CUSTOM PROCEDURE TRAY) ×2 IMPLANT
KIT ROOM TURNOVER OR (KITS) ×2 IMPLANT
NDL BLUNT 16X1.5 OR ONLY (NEEDLE) ×2 IMPLANT
NDL DENTAL 27 LONG (NEEDLE) ×2 IMPLANT
NDL HYPO 25GX1X1/2 BEV (NEEDLE) ×1 IMPLANT
NEEDLE BLUNT 16X1.5 OR ONLY (NEEDLE) ×2 IMPLANT
NEEDLE DENTAL 27 LONG (NEEDLE) ×4 IMPLANT
NEEDLE HYPO 25GX1X1/2 BEV (NEEDLE) IMPLANT
NS IRRIG 1000ML POUR BTL (IV SOLUTION) ×2 IMPLANT
PACK EENT II TURBAN DRAPE (CUSTOM PROCEDURE TRAY) ×2 IMPLANT
PAD ARMBOARD 7.5X6 YLW CONV (MISCELLANEOUS) ×4 IMPLANT
PENCIL BUTTON HOLSTER BLD 10FT (ELECTRODE) IMPLANT
SOAP 2 % CHG 4 OZ (WOUND CARE) ×1 IMPLANT
SUT CHROMIC 3 0 PS 2 (SUTURE) ×4 IMPLANT
SYR 50ML SLIP (SYRINGE) ×4 IMPLANT
SYR BULB IRRIGATION 50ML (SYRINGE) ×2 IMPLANT
TOOTHBRUSH ADULT (PERSONAL CARE ITEMS) ×2 IMPLANT
TOWEL OR 17X26 10 PK STRL BLUE (TOWEL DISPOSABLE) ×1 IMPLANT
TUBE CONNECTING 12X1/4 (SUCTIONS) ×2 IMPLANT
WATER STERILE IRR 1000ML POUR (IV SOLUTION) ×1 IMPLANT

## 2014-08-01 NOTE — Discharge Instructions (Addendum)
HOME CARE INSTRUCTIONS DENTAL PROCEDURES  MEDICATION: Some soreness and discomfort is normal following a dental procedure.  Use of a non-aspirin pain product, like acetaminophen, is recommended.  If pain is not relieved, please call the surgeon who performed the procedure.  ORAL HYGIENE: Brushing of the teeth should be resumed the day after surgery.  Begin slowly and softly.  In children, brushing should be done by the parent after every meal.  DIET: A balanced diet is very important during the healing process.   Liquids and soft foods are advisable.  Drink clear liquids at first, then progress to other liquids as tolerated.  If teeth were removed, do not use a straw for at least 2 days.  Try to limit between-meal snacks which are high in sugar.  ACTIVITY: Limit to quiet indoor activities for 24 hours following surgery.  RETURN TO SCHOOL OR WORK: You may return to school or work in a day or two, or as indicated by your dentist.  GENERAL EXPECTATIONS:  -Bleeding is to be expected after teeth are removed.  The bleeding should slow down after several hours.  -Stitches may be in place, which will fall out by themselves.  If the child pulls them out, do not be concerned.  CALL YOUR DOCTOR IS THESE OCCUR:  -Temperature is 101 degrees or more.  -Persistent bright red bleeding.  -Severe pain.  Return to the doctor's office 6 weeks. Call to make an appointment.  Patient Signature:  ________________________________________________________  Nurse's Signature:  ________________________________________________________   General Anesthesia, Adult, Care After  Refer to this sheet in the next few weeks. These instructions provide you with information on caring for yourself after your procedure. Your health care provider may also give you more specific instructions. Your treatment has been planned according to current medical practices, but problems sometimes occur. Call your health care provider  if you have any problems or questions after your procedure.  WHAT TO EXPECT AFTER THE PROCEDURE  After the procedure, it is typical to experience:  Sleepiness.  Nausea and vomiting. HOME CARE INSTRUCTIONS  For the first 24 hours after general anesthesia:  Have a responsible person with you.  Do not drive a car. If you are alone, do not take public transportation.  Do not drink alcohol.  Do not take medicine that has not been prescribed by your health care provider.  Do not sign important papers or make important decisions.  You may resume a normal diet and activities as directed by your health care provider.  Change bandages (dressings) as directed.  If you have questions or problems that seem related to general anesthesia, call the hospital and ask for the anesthetist or anesthesiologist on call. SEEK MEDICAL CARE IF:  You have nausea and vomiting that continue the day after anesthesia.  You develop a rash. SEEK IMMEDIATE MEDICAL CARE IF:  You have difficulty breathing.  You have chest pain.  You have any allergic problems. Document Released: 09/09/2000 Document Revised: 02/03/2013 Document Reviewed: 12/17/2012  [facility] Patient Information 2014 [place], [place].

## 2014-08-01 NOTE — Brief Op Note (Signed)
08/01/2014  9:04 AM  PATIENT:  [name]  81 y.o. female  PRE-OPERATIVE DIAGNOSIS:  XEROSTOMIA, CARIOUS TEETH #7, 8, 9, 10, 11, AND 19  POST-OPERATIVE DIAGNOSIS: XEROSTOMIA, CARIOUS TEETH #7, 8, 9, 10, 11, AND 19  PROCEDURE:  Procedure(s): EXTRACTIONS OF TEETH NUMBERS 7 8 9 10 11  AND 19 AND ALVEOLOPLASTY UPPER LEFT AND RIGHT QUADRANT (N/A)  SURGEON:  Surgeon(s) and Role:    * Isac Caddy, DDS - Primary  PHYSICIAN ASSISTANT: None   ASSISTANTS: Beth Cox   ANESTHESIA:   general  EBL:     BLOOD ADMINISTERED:none  DRAINS: none   LOCAL MEDICATIONS USED: 0.5% MARCAINE with 1:200,000 epinephrine x 4 carpules, 2% XYLOCAINE with 1:100,000 epinephrine x 6 carpules  SPECIMEN:  No Specimen  DISPOSITION OF SPECIMEN:  N/A  COUNTS:  YES  TOURNIQUET:  * No tourniquets in log *  DICTATION: .Note written in EPIC  PLAN OF CARE: Discharge to home after PACU  PATIENT DISPOSITION:  PACU - hemodynamically stable.   Delay start of Pharmacological VTE agent (>24hrs) due to surgical blood loss or risk of bleeding: not applicable

## 2014-08-01 NOTE — Interval H&P Note (Signed)
History and Physical Interval Note:  08/01/2014 7:32 AM  [name]  has presented today for surgery, with the diagnosis of XEROSTOMIA AND CARIOUS TEETH NUMBERS 7 8 9 10 11  AND 19  The various methods of treatment have been discussed with the patient and family. After consideration of risks, benefits and other options for treatment, the patient has consented to  Procedure(s): EXTRACTIONS OF TEETH NUMBERS 7 8 9 10 11  AND 19 AND ALVEOLOPLASTY UPPER LEFT AND RIGHT QUADRANT (N/A) as a surgical intervention .  The patient's history has been reviewed, patient examined, no change in status, stable for surgery.  I have reviewed the patient's chart and labs.  Questions were answered to the patient's satisfaction.     [place],Avyan Livesay L

## 2014-08-01 NOTE — Op Note (Signed)
08/01/2014  9:04 AM  PATIENT:  [name]  81 y.o. female  PRE-OPERATIVE DIAGNOSIS:  XEROSTOMIA, CARIOUS TEETH #7, 8, 9, 10, 11, AND 19  POST-OPERATIVE DIAGNOSIS:  XEROSTOMIA; CARIOUS TEETH #7, 8, 9, 10, 11, AND 19; ROOT TIP #11  PROCEDURE:  Procedure(s): EXTRACTIONS OF TEETH  #7, 8, 9, 10, 11, AND 19 AND ALVEOLOPLASTY UPPER LEFT AND RIGHT QUADRANT  INDICATIONS FOR PROCEDURE:  [name] was referred to my office by her general dentist for extraction of several teeth (#7, 8, 9, 10, 11 and 19 with alveoloplasty). Given her medical history with significant co-morbidities, we decided that her surgery should be performed in an operating room setting.     SURGEON:  Surgeon(s): Isac Caddy, DDS  PHYSICIAN ASSISTANT: None  ASSISTANTS: Beth Cox    ANESTHESIA:   general   PROCEDURE IN DETAIL:  The patient was seen in the pre-operative area and all of the families questions were answered.  The consent was signed and verified.  The history and physical was also updated.  The patient was taken to the operating room by anesthesia and placed on the operating room table.  The patient was orally intubated and then prepared and draped for Oral and Maxillary Surgery procedures.    A moistened Raytec was placed into the patient's oropharynx as a throat pack.  Then, local anesthetic was used to block the left inferior alveolar nerve and left long buccal nerve.  Local was also used to infiltrate around the maxillary anterior teeth both buccal and lingual.  A total of 0.5% MARCAINE with 1:200,000 epinephrine x 4 carpule's, 2% XYLOCAINE with 1:100,000 epinephrine x 6 carpule's was used.  A 15 blade was used to make a full thickness mucoperiosteal sulcular flap at site #19.  Next, a periosteal was used to elevate the tissue and then bone was removed and the tooth was sectioned.  Next forceps were used to remove all portions of the sectioned tooth.  Next, our attention was turned to the  maxilla were another full thickness sulcular incision was created and teeth # 7, 8, 9, 10, 11 were removed after removing some bone.  An alveoloplasty bur was used to smooth the bone in the upper right and upper left quadrants.  Copious irrigation was used.  Next, 3-0 chromic gut was used to suture both the maxilla and mandible.  The throat pack was removed and all counts were correct.   EBL:   Minimal  DRAINS: none   LOCAL MEDICATIONS USED: 0.5% MARCAINE with 1:200,000 epinephrine x 4 carpules, 2% XYLOCAINE with 1:100,000 epinephrine x 6 carpules  SPECIMEN:  No Specimen  DISPOSITION OF SPECIMEN:  N/A  COUNTS:  YES  DICTATION: .Note written in EPIC  PLAN OF CARE: Discharge to home after PACU  PATIENT DISPOSITION:  PACU - hemodynamically stable.   Delay start of Pharmacological VTE agent (>24hrs) due to surgical blood loss or risk of bleeding:  not applicable

## 2014-08-01 NOTE — Anesthesia Procedure Notes (Signed)
Procedure Name: Intubation Date/Time: 08/01/2014 9:18 AM Performed by: Maeola Harman Pre-anesthesia Checklist: Patient identified, Emergency Drugs available, Suction available, Patient being monitored and Timeout performed Patient Re-evaluated:Patient Re-evaluated prior to inductionOxygen Delivery Method: Circle system utilized Preoxygenation: Pre-oxygenation with 100% oxygen Intubation Type: IV induction Ventilation: Mask ventilation without difficulty Nasal Tubes: Right Tube size: 6.5 mm Number of attempts: 1 Placement Confirmation: positive ETCO2 and breath sounds checked- equal and bilateral Tube secured with: Tape Dental Injury: Teeth and Oropharynx as per pre-operative assessment  Difficulty Due To: Difficulty was anticipated Comments: Easy blind nasal intubation by Dr Tresa Moore, dilated r nare with nasal trumpet size 7.0 by myself after afrin spray.  Passed easily, no bleeding noted.  ETT passed easily into trachea, verified by auscultation and + ETCO2.  Christina Session, CRNA

## 2014-08-01 NOTE — Anesthesia Postprocedure Evaluation (Signed)
  Anesthesia Post-op Note  Patient: [name]  Procedure(s) Performed: Procedure(s): EXTRACTIONS OF TEETH NUMBERS 7 8 9 10 11  AND 19 AND ALVEOLOPLASTY UPPER LEFT AND RIGHT QUADRANT (N/A)  Patient Location: PACU  Anesthesia Type:General  Level of Consciousness: awake and alert   Airway and Oxygen Therapy: Patient Spontanous Breathing and Patient connected to nasal cannula oxygen  Post-op Pain: mild  Post-op Assessment: Post-op Vital signs reviewed, Patient's Cardiovascular Status Stable, PATIENT'S CARDIOVASCULAR STATUS UNSTABLE, Patent Airway and No signs of Nausea or vomiting  Post-op Vital Signs: Reviewed and stable  Last Vitals:  Filed Vitals:   08/01/14 1057  BP: 156/65  Pulse: 75  Temp:   Resp: 16    Complications: No apparent anesthesia complications

## 2014-08-01 NOTE — Transfer of Care (Signed)
Immediate Anesthesia Transfer of Care Note  Patient: [name]  Procedure(s) Performed: Procedure(s): EXTRACTIONS OF TEETH NUMBERS 7 8 9 10 11  AND 19 AND ALVEOLOPLASTY UPPER LEFT AND RIGHT QUADRANT (N/A)  Patient Location: PACU  Anesthesia Type:General  Level of Consciousness: awake, alert  and sedated  Airway & Oxygen Therapy: Patient connected to face mask oxygen  Post-op Assessment: Report given to RN  Post vital signs: stable  Last Vitals:  Filed Vitals:   08/01/14 0624  BP: 160/78  Pulse: 65  Temp: 36.9 C  Resp: 18    Complications: No apparent anesthesia complications

## 2014-08-02 ENCOUNTER — Encounter (HOSPITAL_COMMUNITY): Payer: Self-pay | Admitting: Oral and Maxillofacial Surgery

## 2014-08-02 NOTE — Telephone Encounter (Signed)
ok 

## 2014-08-02 NOTE — Telephone Encounter (Signed)
See below and please advise, Thanks!  

## 2014-08-02 NOTE — Telephone Encounter (Signed)
Lvom advising ok to for verbal orders.

## 2014-08-11 ENCOUNTER — Other Ambulatory Visit: Payer: Self-pay | Admitting: Endocrinology

## 2014-08-18 ENCOUNTER — Ambulatory Visit (INDEPENDENT_AMBULATORY_CARE_PROVIDER_SITE_OTHER): Payer: Medicare Other | Admitting: Endocrinology

## 2014-08-18 ENCOUNTER — Encounter: Payer: Self-pay | Admitting: Endocrinology

## 2014-08-18 VITALS — BP 132/84 | HR 71 | Wt 206.0 lb

## 2014-08-18 DIAGNOSIS — Z0000 Encounter for general adult medical examination without abnormal findings: Secondary | ICD-10-CM

## 2014-08-18 DIAGNOSIS — E1165 Type 2 diabetes mellitus with hyperglycemia: Secondary | ICD-10-CM

## 2014-08-18 DIAGNOSIS — IMO0002 Reserved for concepts with insufficient information to code with codable children: Secondary | ICD-10-CM

## 2014-08-18 DIAGNOSIS — M109 Gout, unspecified: Secondary | ICD-10-CM

## 2014-08-18 DIAGNOSIS — E1129 Type 2 diabetes mellitus with other diabetic kidney complication: Secondary | ICD-10-CM

## 2014-08-18 LAB — URIC ACID: Uric Acid, Serum: 5.1 mg/dL (ref 2.4–7.0)

## 2014-08-18 LAB — HEPATIC FUNCTION PANEL
ALK PHOS: 90 U/L (ref 39–117)
ALT: 11 U/L (ref 0–35)
AST: 21 U/L (ref 0–37)
Albumin: 4 g/dL (ref 3.5–5.2)
Bilirubin, Direct: 0.1 mg/dL (ref 0.0–0.3)
TOTAL PROTEIN: 6.8 g/dL (ref 6.0–8.3)
Total Bilirubin: 0.4 mg/dL (ref 0.2–1.2)

## 2014-08-18 LAB — LIPID PANEL
CHOLESTEROL: 118 mg/dL (ref 0–200)
HDL: 68.3 mg/dL (ref >39.00)
LDL CALC: 31 mg/dL (ref 0–99)
NonHDL: 49.7
Total CHOL/HDL Ratio: 2
Triglycerides: 96 mg/dL (ref 0.0–149.0)
VLDL: 19.2 mg/dL (ref 0.0–40.0)

## 2014-08-18 LAB — CBC WITH DIFFERENTIAL/PLATELET
BASOS ABS: 0 10*3/uL (ref 0.0–0.1)
Basophils Relative: 0 % (ref 0.0–3.0)
Eosinophils Absolute: 0.2 10*3/uL (ref 0.0–0.7)
Eosinophils Relative: 2.1 % (ref 0.0–5.0)
HCT: 37.3 % (ref 36.0–46.0)
Hemoglobin: 11.9 g/dL — ABNORMAL LOW (ref 12.0–15.0)
LYMPHS PCT: 13.6 % (ref 12.0–46.0)
Lymphs Abs: 1 10*3/uL (ref 0.7–4.0)
MCHC: 32 g/dL (ref 30.0–36.0)
MCV: 87.1 fl (ref 78.0–100.0)
MONOS PCT: 5.9 % (ref 3.0–12.0)
Monocytes Absolute: 0.4 10*3/uL (ref 0.1–1.0)
Neutro Abs: 5.7 10*3/uL (ref 1.4–7.7)
Neutrophils Relative %: 78.4 % — ABNORMAL HIGH (ref 43.0–77.0)
PLATELETS: 349 10*3/uL (ref 150.0–400.0)
RBC: 4.29 Mil/uL (ref 3.87–5.11)
RDW: 16.6 % — AB (ref 11.5–15.5)
WBC: 7.3 10*3/uL (ref 4.0–10.5)

## 2014-08-18 LAB — URINALYSIS, ROUTINE W REFLEX MICROSCOPIC
BILIRUBIN URINE: NEGATIVE
Hgb urine dipstick: NEGATIVE
Ketones, ur: NEGATIVE
NITRITE: NEGATIVE
RBC / HPF: NONE SEEN (ref 0–?)
SPECIFIC GRAVITY, URINE: 1.01 (ref 1.000–1.030)
Total Protein, Urine: NEGATIVE
Urine Glucose: NEGATIVE
Urobilinogen, UA: 0.2 (ref 0.0–1.0)
pH: 5.5 (ref 5.0–8.0)

## 2014-08-18 LAB — HEMOGLOBIN A1C: Hgb A1c MFr Bld: 7.1 % — ABNORMAL HIGH (ref 4.6–6.5)

## 2014-08-18 LAB — TSH: TSH: 1.3 u[IU]/mL (ref 0.35–4.50)

## 2014-08-18 NOTE — Patient Instructions (Addendum)
please consider these measures for your health:  minimize alcohol.  do not use tobacco products.  have a colonoscopy at least every 10 years from age 79.  Women should have an annual mammogram from age 9.  keep firearms safely stored.  always use seat belts.  have working smoke alarms in your home.  see an eye doctor and dentist regularly.  never drive under the influence of alcohol or drugs (including prescription drugs).   it is critically important to prevent falling down (keep floor areas well-lit, dry, and free of loose objects.  If you have a cane, walker, or wheelchair, you should use it, even for short trips around the house.  Also, try not to rush).  blood tests are being requested for you today.  We'll let you know about the results.  For your safety, please take on the the prescribed amount of insulin.  check your blood sugar 4 times a day.  vary the time of day when you check, between before the 3 meals, and at bedtime.  also check if you have symptoms of your blood sugar being too high or too low.  please keep a record of the readings and bring it to your next appointment here.  You can write it on any piece of paper.  please call us sooner if your blood sugar goes below 70, or if you have a lot of readings over 200.

## 2014-08-18 NOTE — Progress Notes (Signed)
Subjective:    Patient ID: [name], female    DOB: 1929-07-28, 81 y.o.   MRN: [id number]  HPI Pt is here for regular wellness examination, and is feeling pretty well in general, and says chronic med probs are stable, except as noted below Past Medical History  Diagnosis Date  . Uterine cancer   . Diabetes mellitus     type II; peripheral neuropathy  . Hypertension   . GERD (gastroesophageal reflux disease)   . Peripheral vascular disease     s/p L BKA 08/2012  . Obesity   . Dyslipidemia   . Diastolic CHF, chronic     EF 50-55%, mild LVH and grade 1 diast. Dysfxn  . Osteoarthritis cervical spine   . DM retinopathy   . Sleep apnea     with CPAP  . History of shingles   . COPD (chronic obstructive pulmonary disease)   . Depression   . Peptic ulcer disease     duodenal  . Peptic stricture of esophagus   . Hiatal hernia   . Diverticulosis   . Arthritis   . ASCVD (arteriosclerotic cardiovascular disease)     on MRI brin  . CAD (coronary artery disease)     a. s/p multiple caths with nonobs CAD;   b. cath 1/10: pLAD 20%, mLAD 40%, pCFX 20%, mCFX 40%, pRCA 60-70%;   c.  Myoview 06/02/12: Low anterior wall scar, no ischemia, EF 37%. d. cath 06/20/2014 70% mid LCx, medical therapy, high risk for PCI due to close proximity to large OM   . Cardiomyopathy     a. Echo 05/31/12: Severe LVH, EF 40-45%, diffuse HK, grade 1 diastolic dysfunction, mild to moderate calcified aortic valve annulus, mild to moderate aortic stenosis, MAC, mild MR, moderate LAE  . Asthma     Mild  . Pruritic condition     Idiopathic  . Hyperlipidemia   . Zoster   . Noncompliance   . CKD (chronic kidney disease)     Dr. Lorrene Reid    Past Surgical History  Procedure Laterality Date  . Tubal ligation  1967  . Knee arthroscopy  10/1998    Left  . Craniotomy  1997    Left for SDH  . Cataract extraction, bilateral  2005  . Hernia repair    . Esophagogastroduodenoscopy  04/04/2004  . Spine surgery       C-spine and lumbar surgery  . Cholecystectomy  2010  . Cardiac catheterization    . Dexa  7/05  . Amputation Left 09/04/2012    Procedure: AMPUTATION BELOW KNEE;  Surgeon: Angelia Mould, MD;  Location: [place];  Service: Vascular;  Laterality: Left;  . Abdominal angiogram N/A 08/31/2012    Procedure: ABDOMINAL ANGIOGRAM with runoff poss intervention;  Surgeon: Angelia Mould, MD;  Location: [facility] CATH LAB;  Service: Cardiovascular;  Laterality: N/A;  . Tubal ligation    . Left heart catheterization with coronary angiogram N/A 06/22/2014    Procedure: LEFT HEART CATHETERIZATION WITH CORONARY ANGIOGRAM;  Surgeon: Burnell Blanks, MD;  Location: [facility] CATH LAB;  Service: Cardiovascular;  Laterality: N/A;  . Eye surgery    . Brain surgery      1997 blood clot on the brain, then had to relieve fluid on the brain  . Multiple extractions with alveoloplasty N/A 08/01/2014    Procedure: EXTRACTIONS OF TEETH NUMBERS 7 8 9 10 11  AND 19 AND ALVEOLOPLASTY UPPER LEFT AND RIGHT QUADRANT;  Surgeon: Doreatha Martin  Blockton, DDS;  Location: [place];  Service: Oral Surgery;  Laterality: N/A;    History   Social History  . Marital Status: Married    Spouse Name: N/A  . Number of Children: 7  . Years of Education: N/A   Occupational History  . Retired    Social History Main Topics  . Smoking status: Never Smoker   . Smokeless tobacco: Never Used  . Alcohol Use: No     Comment: rare  . Drug Use: No  . Sexual Activity: No   Other Topics Concern  . Not on file   Social History Narrative    Current Outpatient Prescriptions on File Prior to Visit  Medication Sig Dispense Refill  . acetaminophen (TYLENOL) 500 MG tablet Take 500 mg by mouth every 6 (six) hours as needed for pain. For pain    . albuterol (PROVENTIL HFA;VENTOLIN HFA) 108 (90 BASE) MCG/ACT inhaler Inhale 2 puffs into the lungs every 4 (four) hours as needed for wheezing or shortness of breath. 1 Inhaler 3  . albuterol  (PROVENTIL) (2.5 MG/3ML) 0.083% nebulizer solution Take 3 mLs (2.5 mg total) by nebulization every 4 (four) hours as needed for wheezing or shortness of breath. 75 mL 12  . allopurinol (ZYLOPRIM) 100 MG tablet TAKE 1 TABLET BY MOUTH DAILY 30 tablet 0  . amLODipine (NORVASC) 10 MG tablet Take 1 tablet (10 mg total) by mouth daily. 30 tablet 5  . aspirin 81 MG chewable tablet Chew 1 tablet (81 mg total) by mouth daily.    Marland Kitchen atorvastatin (LIPITOR) 40 MG tablet Take 1 tablet (40 mg total) by mouth daily at 6 PM. 30 tablet 5  . bisacodyl (DULCOLAX) 5 MG EC tablet Take 1 tablet (5 mg total) by mouth daily as needed for moderate constipation. 30 tablet 0  . calcitRIOL (ROCALTROL) 0.25 MCG capsule TAKE ONE CAPSULE BY MOUTH EVERY DAY 30 capsule 0  . clopidogrel (PLAVIX) 75 MG tablet Take 1 tablet (75 mg total) by mouth daily. 30 tablet 11  . docusate sodium (COLACE) 100 MG capsule Take 100 mg by mouth 2 (two) times daily as needed for mild constipation (constipation).     Marland Kitchen donepezil (ARICEPT) 5 MG tablet Take 1 tablet (5 mg total) by mouth at bedtime. 30 tablet 11  . ezetimibe (ZETIA) 10 MG tablet Take 1 tablet (10 mg total) by mouth every morning. 30 tablet 11  . feeding supplement (GLUCERNA SHAKE) LIQD Take 237 mLs by mouth 2 (two) times daily.    . Fluticasone-Salmeterol (ADVAIR DISKUS) 100-50 MCG/DOSE AEPB Inhale 1 puff into the lungs 2 (two) times daily. 1 each 11  . furosemide (LASIX) 80 MG tablet Take 0.5 tablets (40 mg total) by mouth daily. 90 tablet 3  . gabapentin (NEURONTIN) 300 MG capsule TAKE 1 CAPSULE BY MOUTH AT BEDTIME 30 capsule 0  . HYDROcodone-acetaminophen (NORCO) 10-325 MG per tablet Take one tablet by mouth every 4 hours as needed for pain 180 tablet 0  . insulin NPH-regular Human (NOVOLIN 70/30) (70-30) 100 UNIT/ML injection 18 units with breakfast, and 10 units with the evening meal. 10 mL 2  . isosorbide mononitrate (IMDUR) 60 MG 24 hr tablet Take 1 tablet (60 mg total) by mouth  daily. 30 tablet 5  . labetalol (NORMODYNE) 100 MG tablet TAKE 1 TABLET BY MOUTH TWICE DAILY 60 tablet 0  . latanoprost (XALATAN) 0.005 % ophthalmic solution Place 1 drop into both eyes at bedtime.    . nebivolol (BYSTOLIC) 2.5  MG tablet Take 1 tablet (2.5 mg total) by mouth daily. 30 tablet 5  . nitroGLYCERIN (NITROSTAT) 0.4 MG SL tablet Place 0.4 mg under the tongue every 5 (five) minutes as needed for chest pain.     . ONE TOUCH ULTRA TEST test strip USE TO TEST BLOOD SUGAR TWICE DAILY 100 each 2  . pantoprazole (PROTONIX) 40 MG tablet TAKE 1 TABLET BY MOUTH TWICE DAILY 60 tablet 0  . polyethylene glycol (MIRALAX / GLYCOLAX) packet Take 17 g by mouth daily as needed for mild constipation. 14 each 0  . potassium chloride SA (K-DUR,KLOR-CON) 20 MEQ tablet Take 1 tablet (20 mEq total) by mouth daily. 90 tablet 3  . sertraline (ZOLOFT) 100 MG tablet TAKE 1 TABLET BY MOUTH EVERY DAY 30 tablet 0   No current facility-administered medications on file prior to visit.    Allergies  Allergen Reactions  . Iohexol      Code: RASH, Desc: [place] ON PT'S CHART ALLERGIC TO IV DYE 09/04/07/RM, Onset Date: 24401027     Family History  Problem Relation Age of Onset  . Cancer - Other Mother     "Stomach" Cancer  . Diabetes Mother   . Heart disease Mother   . Stomach cancer Mother   . Hypertension Mother   . Lymphoma Father   . Hypertension Father   . Kidney disease Paternal Grandmother   . Asthma Other   . Diabetes Sister   . Rheum arthritis Mother   . Rheum arthritis Father     BP 132/84 mmHg  Pulse 71  Wt 206 lb (93.441 kg)  SpO2 96%     Review of Systems  Constitutional: Negative for fever.  HENT: Negative for hearing loss.   Eyes: Negative for visual disturbance.  Respiratory: Negative for shortness of breath.   Cardiovascular: Negative for chest pain.  Gastrointestinal: Negative for anal bleeding.  Endocrine: Negative for cold intolerance.  Genitourinary: Negative  for hematuria.  Musculoskeletal: Positive for gait problem.  Skin: Negative for rash.  Allergic/Immunologic: Negative for environmental allergies.  Neurological: Positive for numbness. Negative for syncope.  Hematological: Bruises/bleeds easily.  Psychiatric/Behavioral: Negative for dysphoric mood.       Objective:   Physical Exam HEAD: head: no deformity eyes: no periorbital swelling, no proptosis external nose and ears are normal mouth: no lesion seen NECK: supple, thyroid is not enlarged CHEST WALL: no deformity LUNGS:  Clear to auscultation BREASTS: sees gyn CV: reg rate and rhythm, no murmur ABD: abdomen is soft, nontender.  no hepatosplenomegaly.  not distended.  no hernia GENITALIA/RECTAL: sees gyn MUSCULOSKELETAL: muscle bulk and strength are grossly normal.  no obvious joint swelling.  gait is normal and steady PULSES: no carotid bruit NEURO:  cn 2-12 grossly intact.   readily moves all 4's.  SKIN:  Normal texture and temperature.  No rash or suspicious lesion is visible.   NODES:  None palpable at the neck PSYCH: alert, well-oriented.  Does not appear anxious nor depressed.       Assessment & Plan:  Wellness visit today, with problems stable, except as noted.    SEPARATE EVALUATION FOLLOWS--EACH PROBLEM HERE IS NEW, NOT RESPONDING TO TREATMENT, OR POSES SIGNIFICANT RISK TO THE PATIENT'S HEALTH: HISTORY OF THE PRESENT ILLNESS: Pt returns for f/u of diabetes mellitus: DM type: Insulin-requiring type 2 Dx'ed: 2536 Complications: polyneuropathy, nephropathy, CAD, and PAD Therapy: insulin since 1991 GDM: never DKA: never Severe hypoglycemia: last episode was in 2014 Pancreatitis: never Other: therapy is  limited by pt's request for least expensive insulin, and by her need for a simple regimen; husband administers insulin to her. Interval history: husb says he continues to give her 15-20 units in the evening, due to high cbg's in the afternoon.  no cbg record, but  states cbg's are well-controlled.   PAST MEDICAL HISTORY reviewed and up to date today REVIEW OF SYSTEMS: denies hypoglycemia.  She has lost a few lbs PHYSICAL EXAMINATION: VITAL SIGNS:  See vs page GENERAL: no distress.  In wheelchair LLE: BKA.   Right foot: no deformity.  There is bilateral onychomycosis.  Skin: no ulcer, and normal color and temp, on the right foot.  Neuro: sensation is intact to touch on the right foot.   CV: 1+ right leg edema. LAB/XRAY RESULTS: UA: few wbc Lab Results  Component Value Date   HGBA1C 7.1* 08/18/2014  IMPRESSION: Pyuria, new DM: this is the best control this pt should aim for, given this regimen, which does match insulin to her changing needs throughout the day Noncompliance with cbg recording and insulin dosing. PLAN: For your safety, please take only the the prescribed amount of insulin. If you have no UTI symptoms, no rx is needed.

## 2014-08-18 NOTE — Progress Notes (Signed)
we discussed code status.  pt requests full code, but would not want to be started or maintained on artificial life-support measures if there was not a reasonable chance of recovery 

## 2014-08-19 ENCOUNTER — Other Ambulatory Visit: Payer: Self-pay | Admitting: Endocrinology

## 2014-08-28 ENCOUNTER — Other Ambulatory Visit: Payer: Self-pay | Admitting: Endocrinology

## 2014-09-08 ENCOUNTER — Other Ambulatory Visit: Payer: Self-pay | Admitting: Endocrinology

## 2014-09-16 ENCOUNTER — Other Ambulatory Visit: Payer: Self-pay | Admitting: Endocrinology

## 2014-09-20 ENCOUNTER — Encounter: Payer: Self-pay | Admitting: Vascular Surgery

## 2014-09-21 ENCOUNTER — Encounter: Payer: Self-pay | Admitting: Vascular Surgery

## 2014-09-21 ENCOUNTER — Ambulatory Visit (HOSPITAL_COMMUNITY)
Admission: RE | Admit: 2014-09-21 | Discharge: 2014-09-21 | Disposition: A | Discharge status: Home / Self Care | Payer: Medicare Other | Source: Ambulatory Visit | Attending: Vascular Surgery | Admitting: Vascular Surgery

## 2014-09-21 ENCOUNTER — Ambulatory Visit (INDEPENDENT_AMBULATORY_CARE_PROVIDER_SITE_OTHER): Payer: Medicare Other | Admitting: Vascular Surgery

## 2014-09-21 VITALS — BP 143/54 | HR 64 | Ht 66.0 in | Wt 206.0 lb

## 2014-09-21 DIAGNOSIS — Z48812 Encounter for surgical aftercare following surgery on the circulatory system: Secondary | ICD-10-CM

## 2014-09-21 DIAGNOSIS — I739 Peripheral vascular disease, unspecified: Secondary | ICD-10-CM | POA: Insufficient documentation

## 2014-09-21 NOTE — Progress Notes (Signed)
[facility] [place] HISTORY AND PHYSICAL   History of Present Illness:  Patient is a 81 y.o. year old female who presents for her annule check and ABI's.  She was last seen 06/23/2013.  Her chief complaint today is right heel pain at rest.  Elevation helps the pain.  She underwent a left below the knee amputation for ischemic left foot on 09/04/2012. Her arteriogram showed severe tibial artery occlusive disease with no options for revascularization. She has known right LE PAD which she denies symptoms of claudication.  She reports no history of non healing wounds.    She recently was admitted for NSTIM MI and was placed on Plavix.  No coronary intervention was needed.  She takes Lipitor, Nebivolo, and 81 mg Asprin daily.  She denies CP or SOB today.  Past Medical History  Diagnosis Date  . Uterine cancer   . Diabetes mellitus     type II; peripheral neuropathy  . Hypertension   . GERD (gastroesophageal reflux disease)   . Peripheral vascular disease     s/p L BKA 08/2012  . Obesity   . Dyslipidemia   . Diastolic CHF, chronic     EF 50-55%, mild LVH and grade 1 diast. Dysfxn  . Osteoarthritis cervical spine   . DM retinopathy   . Sleep apnea     with CPAP  . History of shingles   . COPD (chronic obstructive pulmonary disease)   . Depression   . Peptic ulcer disease     duodenal  . Peptic stricture of esophagus   . Hiatal hernia   . Diverticulosis   . Arthritis   . ASCVD (arteriosclerotic cardiovascular disease)     on MRI brin  . CAD (coronary artery disease)     a. s/p multiple caths with nonobs CAD;   b. cath 1/10: pLAD 20%, mLAD 40%, pCFX 20%, mCFX 40%, pRCA 60-70%;   c.  Myoview 06/02/12: Low anterior wall scar, no ischemia, EF 37%. d. cath 06/20/2014 70% mid LCx, medical therapy, high risk for PCI due to close proximity to large OM   . Cardiomyopathy     a. Echo 05/31/12: Severe LVH, EF 40-45%, diffuse HK, grade 1 diastolic dysfunction, mild to moderate  calcified aortic valve annulus, mild to moderate aortic stenosis, MAC, mild MR, moderate LAE  . Asthma     Mild  . Pruritic condition     Idiopathic  . Hyperlipidemia   . Zoster   . Noncompliance   . CKD (chronic kidney disease)     Dr. Lorrene Reid    Past Surgical History  Procedure Laterality Date  . Tubal ligation  1967  . Knee arthroscopy  10/1998    Left  . Craniotomy  1997    Left for SDH  . Cataract extraction, bilateral  2005  . Hernia repair    . Esophagogastroduodenoscopy  04/04/2004  . Spine surgery      C-spine and lumbar surgery  . Cholecystectomy  2010  . Cardiac catheterization    . Dexa  7/05  . Amputation Left 09/04/2012    Procedure: AMPUTATION BELOW KNEE;  Surgeon: Angelia Mould, MD;  Location: [place];  Service: Vascular;  Laterality: Left;  . Abdominal angiogram N/A 08/31/2012    Procedure: ABDOMINAL ANGIOGRAM with runoff poss intervention;  Surgeon: Angelia Mould, MD;  Location: [facility] CATH LAB;  Service: Cardiovascular;  Laterality: N/A;  . Tubal ligation    . Left heart catheterization with  coronary angiogram N/A 06/22/2014    Procedure: LEFT HEART CATHETERIZATION WITH CORONARY ANGIOGRAM;  Surgeon: Burnell Blanks, MD;  Location: [facility] CATH LAB;  Service: Cardiovascular;  Laterality: N/A;  . Eye surgery    . Brain surgery      1997 blood clot on the brain, then had to relieve fluid on the brain  . Multiple extractions with alveoloplasty N/A 08/01/2014    Procedure: EXTRACTIONS OF TEETH NUMBERS 7 8 9 10 11  AND 19 AND ALVEOLOPLASTY UPPER LEFT AND RIGHT QUADRANT;  Surgeon: Isac Caddy, DDS;  Location: [place];  Service: Oral Surgery;  Laterality: N/A;    ROS:   General:  No weight loss, Fever, chills  HEENT: No recent headaches, no nasal bleeding, no visual changes, no sore throat  Neurologic: No dizziness, blackouts, seizures. No recent symptoms of stroke or mini- stroke. No recent episodes of slurred speech, or temporary  blindness.  Cardiac: No recent episodes of chest pain/pressure, no shortness of breath at rest.  No shortness of breath with exertion.  Denies history of atrial fibrillation or irregular heartbeat  Vascular: No history of rest pain in feet.  No history of claudication.  No history of non-healing ulcer, No history of DVT   Pulmonary: No home oxygen, no productive cough, no hemoptysis,  No asthma or wheezing  Musculoskeletal:  [ ]  Arthritis, [ ]  Low back pain,  [ ]  Joint pain  Hematologic:No history of hypercoagulable state.  No history of easy bleeding.  No history of anemia  Gastrointestinal: No hematochezia or melena,  No gastroesophageal reflux, no trouble swallowing  Urinary: [ x] chronic Kidney disease, [ ]  on HD - [ ]  MWF or [ ]  TTHS, [ ]  Burning with urination, [ ]  Frequent urination, [ ]  Difficulty urinating;   Skin: No rashes  Psychological: No history of anxiety,  No history of depression  Social History History  Substance Use Topics  . Smoking status: Never Smoker   . Smokeless tobacco: Never Used  . Alcohol Use: No     Comment: rare    Family History Family History  Problem Relation Age of Onset  . Cancer - Other Mother     "Stomach" Cancer  . Diabetes Mother   . Heart disease Mother   . Stomach cancer Mother   . Hypertension Mother   . Lymphoma Father   . Hypertension Father   . Kidney disease Paternal Grandmother   . Asthma Other   . Diabetes Sister   . Rheum arthritis Mother   . Rheum arthritis Father     Allergies  Allergies  Allergen Reactions  . Iohexol      Code: RASH, Desc: [place] ON PT'S CHART ALLERGIC TO IV DYE 09/04/07/RM, Onset Date: [id number]      Current Outpatient Prescriptions  Medication Sig Dispense Refill  . acetaminophen (TYLENOL) 500 MG tablet Take 500 mg by mouth every 6 (six) hours as needed for pain. For pain    . albuterol (PROVENTIL HFA;VENTOLIN HFA) 108 (90 BASE) MCG/ACT inhaler Inhale 2 puffs into the lungs  every 4 (four) hours as needed for wheezing or shortness of breath. 1 Inhaler 3  . albuterol (PROVENTIL) (2.5 MG/3ML) 0.083% nebulizer solution Take 3 mLs (2.5 mg total) by nebulization every 4 (four) hours as needed for wheezing or shortness of breath. 75 mL 12  . allopurinol (ZYLOPRIM) 100 MG tablet TAKE 1 TABLET BY MOUTH DAILY 30 tablet 0  . amLODipine (NORVASC) 10 MG tablet Take 1  tablet (10 mg total) by mouth daily. 30 tablet 5  . aspirin 81 MG chewable tablet Chew 1 tablet (81 mg total) by mouth daily.    Marland Kitchen atorvastatin (LIPITOR) 40 MG tablet Take 1 tablet (40 mg total) by mouth daily at 6 PM. 30 tablet 5  . bisacodyl (DULCOLAX) 5 MG EC tablet Take 1 tablet (5 mg total) by mouth daily as needed for moderate constipation. 30 tablet 0  . calcitRIOL (ROCALTROL) 0.25 MCG capsule TAKE ONE CAPSULE BY MOUTH EVERY DAY 30 capsule 0  . clopidogrel (PLAVIX) 75 MG tablet Take 1 tablet (75 mg total) by mouth daily. 30 tablet 11  . docusate sodium (COLACE) 100 MG capsule Take 100 mg by mouth 2 (two) times daily as needed for mild constipation (constipation).     Marland Kitchen donepezil (ARICEPT) 5 MG tablet Take 1 tablet (5 mg total) by mouth at bedtime. 30 tablet 11  . ezetimibe (ZETIA) 10 MG tablet Take 1 tablet (10 mg total) by mouth every morning. 30 tablet 11  . feeding supplement (GLUCERNA SHAKE) LIQD Take 237 mLs by mouth 2 (two) times daily.    . Fluticasone-Salmeterol (ADVAIR DISKUS) 100-50 MCG/DOSE AEPB Inhale 1 puff into the lungs 2 (two) times daily. 1 each 11  . furosemide (LASIX) 80 MG tablet Take 0.5 tablets (40 mg total) by mouth daily. 90 tablet 3  . gabapentin (NEURONTIN) 300 MG capsule TAKE 1 CAPSULE BY MOUTH AT BEDTIME 30 capsule 0  . hydrALAZINE (APRESOLINE) 25 MG tablet TAKE 1 TABLET 3 TIMES A DAY. 90 tablet 0  . HYDROcodone-acetaminophen (NORCO) 10-325 MG per tablet Take one tablet by mouth every 4 hours as needed for pain 180 tablet 0  . insulin NPH-regular Human (NOVOLIN 70/30) (70-30) 100  UNIT/ML injection 18 units with breakfast, and 10 units with the evening meal. 10 mL 2  . isosorbide mononitrate (IMDUR) 60 MG 24 hr tablet Take 1 tablet (60 mg total) by mouth daily. 30 tablet 5  . labetalol (NORMODYNE) 100 MG tablet TAKE 1 TABLET BY MOUTH TWICE DAILY 60 tablet 0  . latanoprost (XALATAN) 0.005 % ophthalmic solution Place 1 drop into both eyes at bedtime.    . nebivolol (BYSTOLIC) 2.5 MG tablet Take 1 tablet (2.5 mg total) by mouth daily. 30 tablet 5  . nitroGLYCERIN (NITROSTAT) 0.4 MG SL tablet Place 0.4 mg under the tongue every 5 (five) minutes as needed for chest pain.     . ONE TOUCH ULTRA TEST test strip USE TO TEST BLOOD SUGAR TWICE DAILY 100 each 2  . pantoprazole (PROTONIX) 40 MG tablet TAKE 1 TABLET BY MOUTH TWICE DAILY 60 tablet 0  . polyethylene glycol (MIRALAX / GLYCOLAX) packet Take 17 g by mouth daily as needed for mild constipation. 14 each 0  . potassium chloride SA (K-DUR,KLOR-CON) 20 MEQ tablet Take 1 tablet (20 mEq total) by mouth daily. 90 tablet 3  . sertraline (ZOLOFT) 100 MG tablet TAKE 1 TABLET BY MOUTH EVERY DAY 30 tablet 0   No current facility-administered medications for this visit.    Physical Examination  Filed Vitals:   09/21/14 1345 09/21/14 1348  BP: 152/50 143/54  Pulse: 65 64  Height: 5\' 6"  (1.676 m)   Weight: 206 lb (93.441 kg)   SpO2: 98%     Body mass index is 33.27 kg/(m^2).  General:  Alert and oriented, no acute distress HEENT: Normal Neck: No bruit or JVD Pulmonary: Clear to auscultation bilaterally Cardiac: Regular Rate and Rhythm without  murmur Abdomen: Soft, non-tender, non-distended, no mass, no scars Skin: No rash, right medial heel tenderness to palpation without open wounds Extremity Pulses:  2+ radial, brachial, femoral, non palpable right  dorsalis pedis, posterior tibial pulses.  Left BKA Musculoskeletal: No deformity or edema  Neurologic: Upper and lower extremity motor 5/5   DATA:  ABI: Right 0.60 with  monophasic flow Left BKA   ASSESSMENT:  PAD asymptomatic right LE S/P left BKA due to ischemic changes and no optional revascularization.  She has a decrease in arterial ABI from 0.81 to 0.60, but she has no ulcers or symptoms of claudication or rest pain.      PLAN:  She will continue to try and be as active as possible, she does have a left prosthetic leg for ambulation.  We recommend maximum medical control of her hyperglycemia and eating plenty of fresh vegetables daily. She will f/u in 1 year for repeat ABI's.  Theda Sers EMMA [facility] PA-C [facility] [place] Office: [phone]  She was seen today in conjunction with Dr. Scot Dock

## 2014-09-24 ENCOUNTER — Other Ambulatory Visit: Payer: Self-pay | Admitting: Endocrinology

## 2014-09-25 ENCOUNTER — Other Ambulatory Visit: Payer: Self-pay | Admitting: Physician Assistant

## 2014-10-05 ENCOUNTER — Other Ambulatory Visit: Payer: Self-pay | Admitting: Endocrinology

## 2014-10-07 ENCOUNTER — Other Ambulatory Visit: Payer: Self-pay | Admitting: Endocrinology

## 2014-10-13 ENCOUNTER — Other Ambulatory Visit: Payer: Self-pay | Admitting: Endocrinology

## 2014-10-22 ENCOUNTER — Other Ambulatory Visit: Payer: Self-pay | Admitting: Endocrinology

## 2014-10-25 ENCOUNTER — Encounter: Payer: Self-pay | Admitting: Cardiology

## 2014-10-25 ENCOUNTER — Ambulatory Visit (INDEPENDENT_AMBULATORY_CARE_PROVIDER_SITE_OTHER): Payer: Medicare Other | Admitting: Cardiology

## 2014-10-25 VITALS — BP 122/72 | HR 56 | Ht 66.0 in | Wt 205.0 lb

## 2014-10-25 DIAGNOSIS — I152 Hypertension secondary to endocrine disorders: Secondary | ICD-10-CM

## 2014-10-25 DIAGNOSIS — E1159 Type 2 diabetes mellitus with other circulatory complications: Secondary | ICD-10-CM

## 2014-10-25 DIAGNOSIS — E1169 Type 2 diabetes mellitus with other specified complication: Secondary | ICD-10-CM

## 2014-10-25 DIAGNOSIS — I2510 Atherosclerotic heart disease of native coronary artery without angina pectoris: Secondary | ICD-10-CM

## 2014-10-25 DIAGNOSIS — I5043 Acute on chronic combined systolic (congestive) and diastolic (congestive) heart failure: Secondary | ICD-10-CM | POA: Diagnosis not present

## 2014-10-25 DIAGNOSIS — I10 Essential (primary) hypertension: Secondary | ICD-10-CM

## 2014-10-25 DIAGNOSIS — R42 Dizziness and giddiness: Secondary | ICD-10-CM

## 2014-10-25 DIAGNOSIS — I2583 Coronary atherosclerosis due to lipid rich plaque: Principal | ICD-10-CM

## 2014-10-25 LAB — COMPREHENSIVE METABOLIC PANEL
ALT: 10 U/L (ref 0–35)
AST: 17 U/L (ref 0–37)
Albumin: 3.7 g/dL (ref 3.5–5.2)
Alkaline Phosphatase: 90 U/L (ref 39–117)
BUN: 24 mg/dL — ABNORMAL HIGH (ref 6–23)
CO2: 28 mEq/L (ref 19–32)
Calcium: 8.9 mg/dL (ref 8.4–10.5)
Chloride: 105 mEq/L (ref 96–112)
Creatinine, Ser: 1.54 mg/dL — ABNORMAL HIGH (ref 0.40–1.20)
GFR: 41.36 mL/min — ABNORMAL LOW (ref >60.00)
Glucose, Bld: 231 mg/dL — ABNORMAL HIGH (ref 70–99)
Potassium: 3.8 mEq/L (ref 3.5–5.1)
Sodium: 140 mEq/L (ref 135–145)
Total Bilirubin: 0.6 mg/dL (ref 0.2–1.2)
Total Protein: 6.6 g/dL (ref 6.0–8.3)

## 2014-10-25 NOTE — Patient Instructions (Signed)
Medication Instructions:   STOP LABETALOL   Labwork:  TODAY---CMET    Follow-Up:  Your physician recommends that you schedule a follow-up appointment in: [place]

## 2014-10-25 NOTE — Progress Notes (Signed)
Patient ID: [name], female   DOB: [birth year], 81 y.o.   MRN: [id number]    CARDIOLOGY OFFICE NOTE  Date:  10/25/2014   Sigmund Hazel Date of Birth: 1930/03/29 Medical Record #[id number]  PCP:  Renato Shin, MD  Cardiologist:  Meda Coffee    Chief complain; follow up for CAD  History of Present Illness: [name] is a 81 y.o. female who presents today for a pre op clearance visit. Seen for Dr. Meda Coffee.  She has a history of diastolic heart failure, prior nonobstructive CAD, DM2, HTN, HL, sleep apnea, PVD with prior L BKA and CKD (followed by Dr. Lorrene Reid).   Last office follow-up with Dr. Meda Coffee on 06/08/2014, patient complained of worsening lower extremity edema and progressive shortness of breath for the last 2-3 month. Of note, her Lasix was decreased in October due to worsening symptoms creatinine. She also complained of orthopnea or paroxysmal nocturnal dyspnea at the time. Dr. Meda Coffee had recommended a repeat nuclear stress test to assess for ischemia however, the study was not completed.   Patient presented to [facility] 2016 with complaint of chest pain radiating to her arm and associated shortness of breath, nausea and diaphoresis. She was ruled in for NTEMI, she was seen by cardiology team and that was subsequently transferred from [facility]. Myoview was initially obtained on 06/20/2014 for risk stratification which showed EF 44%, small mild reversible defect in the inferolateral wall which could be to breast attenuation versus ischemia, mild LV dysfunction with global hypokinesis, intermediate risk stress test finding. Plavix was added for NSTEMI. She had lots of issues with BP management and hyperkalemia while hospitalized. She did have cardiac cath which showed 70% stenosis in distal AV circumflex groove after takeoff of OM1, 20% oxygen LAD, 30% mid LAD, diffuse 50% proximal to mid RCA stenosis. LV gram was deferred given her high risk for contrast  nephropathy. Medical regiment was recommended for her distal circumflex stenosis given the close proximity to large caliber OM branch and high risk of occluding the OM branch with intervention. Per cath note, if she has recurrent angina symptom, high risk PCI of left circumflex artery stenosis can be considered. Echocardiogram obtained showed EF 50-55%, with grade 3 diastolic dysfunction. Post cath, patient has some contrast nephropathy with creatinine peaked on 1/8 with 2.13. She has had significant bradycardia but able to be discharged on low dose Bystolic. Spironolactone was deemed not a good choice for this patient with her intermittent hyperkalemia. Her Lasix continued to be held at discharge as well as ACE.   I saw her back last month - she had only been home a few days but doing ok. I added back her diuretic and potassium but at half dose.   Saw Dr. Gwenette Greet with pulmonary last week.   10/25/2014 - 3 months follow up, comes with her husband Arnell Sieving. She had multiple teeth extracted without complications. She denies chest pain and has stable exertional SOB when she walks with a walker. She is complaining of pain and erythema in the right 5.hallux.  Denies palpitation, syncope or dizziness.    Past Medical History  Diagnosis Date  . Uterine cancer   . Diabetes mellitus     type II; peripheral neuropathy  . Hypertension   . GERD (gastroesophageal reflux disease)   . Peripheral vascular disease     s/p L BKA 08/2012  . Obesity   . Dyslipidemia   . Diastolic CHF, chronic  EF 50-55%, mild LVH and grade 1 diast. Dysfxn  . Osteoarthritis cervical spine   . DM retinopathy   . Sleep apnea     with CPAP  . History of shingles   . COPD (chronic obstructive pulmonary disease)   . Depression   . Peptic ulcer disease     duodenal  . Peptic stricture of esophagus   . Hiatal hernia   . Diverticulosis   . Arthritis   . ASCVD (arteriosclerotic cardiovascular disease)     on MRI brin  .  CAD (coronary artery disease)     a. s/p multiple caths with nonobs CAD;   b. cath 1/10: pLAD 20%, mLAD 40%, pCFX 20%, mCFX 40%, pRCA 60-70%;   c.  Myoview 06/02/12: Low anterior wall scar, no ischemia, EF 37%. d. cath 06/20/2014 70% mid LCx, medical therapy, high risk for PCI due to close proximity to large OM   . Cardiomyopathy     a. Echo 05/31/12: Severe LVH, EF 40-45%, diffuse HK, grade 1 diastolic dysfunction, mild to moderate calcified aortic valve annulus, mild to moderate aortic stenosis, MAC, mild MR, moderate LAE  . Asthma     Mild  . Pruritic condition     Idiopathic  . Hyperlipidemia   . Zoster   . Noncompliance   . CKD (chronic kidney disease)     Dr. Lorrene Reid    Past Surgical History  Procedure Laterality Date  . Tubal ligation  1967  . Knee arthroscopy  10/1998    Left  . Craniotomy  1997    Left for SDH  . Cataract extraction, bilateral  2005  . Hernia repair    . Esophagogastroduodenoscopy  04/04/2004  . Spine surgery      C-spine and lumbar surgery  . Cholecystectomy  2010  . Cardiac catheterization    . Dexa  7/05  . Amputation Left 09/04/2012    Procedure: AMPUTATION BELOW KNEE;  Surgeon: Angelia Mould, MD;  Location: [facility];  Service: Vascular;  Laterality: Left;  . Abdominal angiogram N/A 08/31/2012    Procedure: ABDOMINAL ANGIOGRAM with runoff poss intervention;  Surgeon: Angelia Mould, MD;  Location: [facility] CATH LAB;  Service: Cardiovascular;  Laterality: N/A;  . Tubal ligation    . Left heart catheterization with coronary angiogram N/A 06/22/2014    Procedure: LEFT HEART CATHETERIZATION WITH CORONARY ANGIOGRAM;  Surgeon: Burnell Blanks, MD;  Location: [facility] CATH LAB;  Service: Cardiovascular;  Laterality: N/A;  . Eye surgery    . Brain surgery      1997 blood clot on the brain, then had to relieve fluid on the brain  . Multiple extractions with alveoloplasty N/A 08/01/2014    Procedure: EXTRACTIONS OF TEETH NUMBERS 7 8 9 10 11  AND 19 AND  ALVEOLOPLASTY UPPER LEFT AND RIGHT QUADRANT;  Surgeon: Isac Caddy, DDS;  Location: [facility];  Service: Oral Surgery;  Laterality: N/A;     Medications: Current Outpatient Prescriptions  Medication Sig Dispense Refill  . acetaminophen (TYLENOL) 500 MG tablet Take 500 mg by mouth every 6 (six) hours as needed for pain. For pain    . albuterol (PROVENTIL HFA;VENTOLIN HFA) 108 (90 BASE) MCG/ACT inhaler Inhale 2 puffs into the lungs every 4 (four) hours as needed for wheezing or shortness of breath. 1 Inhaler 3  . albuterol (PROVENTIL) (2.5 MG/3ML) 0.083% nebulizer solution Take 3 mLs (2.5 mg total) by nebulization every 4 (four) hours as needed for wheezing or shortness of breath. 75  mL 12  . allopurinol (ZYLOPRIM) 100 MG tablet TAKE 1 TABLET BY MOUTH DAILY 30 tablet 0  . amLODipine (NORVASC) 10 MG tablet Take 1 tablet (10 mg total) by mouth daily. 30 tablet 5  . aspirin 81 MG chewable tablet Chew 1 tablet (81 mg total) by mouth daily.    Marland Kitchen atorvastatin (LIPITOR) 40 MG tablet Take 1 tablet (40 mg total) by mouth daily at 6 PM. 30 tablet 5  . bisacodyl (DULCOLAX) 5 MG EC tablet Take 1 tablet (5 mg total) by mouth daily as needed for moderate constipation. 30 tablet 0  . calcitRIOL (ROCALTROL) 0.25 MCG capsule TAKE ONE CAPSULE BY MOUTH EVERY DAY 30 capsule 0  . clopidogrel (PLAVIX) 75 MG tablet Take 1 tablet (75 mg total) by mouth daily. 30 tablet 11  . docusate sodium (COLACE) 100 MG capsule Take 100 mg by mouth 2 (two) times daily as needed for mild constipation (constipation).     Marland Kitchen donepezil (ARICEPT) 5 MG tablet Take 1 tablet (5 mg total) by mouth at bedtime. 30 tablet 11  . ezetimibe (ZETIA) 10 MG tablet Take 1 tablet (10 mg total) by mouth every morning. 30 tablet 11  . feeding supplement (GLUCERNA SHAKE) LIQD Take 237 mLs by mouth 2 (two) times daily.    . Fluticasone-Salmeterol (ADVAIR DISKUS) 100-50 MCG/DOSE AEPB Inhale 1 puff into the lungs 2 (two) times daily. 1 each 11  .  furosemide (LASIX) 80 MG tablet Take 0.5 tablets (40 mg total) by mouth daily. 90 tablet 3  . gabapentin (NEURONTIN) 300 MG capsule TAKE 1 CAPSULE BY MOUTH AT BEDTIME 30 capsule 0  . hydrALAZINE (APRESOLINE) 25 MG tablet TAKE 1 TABLET BY MOUTH THREE TIMES DAILY 90 tablet 0  . HYDROcodone-acetaminophen (NORCO) 10-325 MG per tablet Take one tablet by mouth every 4 hours as needed for pain 180 tablet 0  . isosorbide mononitrate (IMDUR) 60 MG 24 hr tablet Take 1 tablet (60 mg total) by mouth daily. 30 tablet 5  . labetalol (NORMODYNE) 100 MG tablet TAKE 1 TABLET BY MOUTH TWICE DAILY 60 tablet 0  . latanoprost (XALATAN) 0.005 % ophthalmic solution Place 1 drop into both eyes at bedtime.    . nebivolol (BYSTOLIC) 2.5 MG tablet Take 1 tablet (2.5 mg total) by mouth daily. 30 tablet 5  . nitroGLYCERIN (NITROSTAT) 0.4 MG SL tablet Place 0.4 mg under the tongue every 5 (five) minutes as needed for chest pain.     Marland Kitchen NOVOLIN 70/30 (70-30) 100 UNIT/ML injection INJECT 18 UNITS UNDER THE SKIN WITH BREAKFAST AND THEN INJECT 10 UNITS WITH THE EVENING MEAL 10 mL 0  . ONE TOUCH ULTRA TEST test strip USE TO TEST BLOOD SUGAR TWICE DAILY 100 each 2  . pantoprazole (PROTONIX) 40 MG tablet TAKE 1 TABLET BY MOUTH TWICE DAILY 60 tablet 0  . polyethylene glycol (MIRALAX / GLYCOLAX) packet Take 17 g by mouth daily as needed for mild constipation. 14 each 0  . potassium chloride SA (K-DUR,KLOR-CON) 20 MEQ tablet Take 1 tablet (20 mEq total) by mouth daily. 90 tablet 3  . sertraline (ZOLOFT) 100 MG tablet TAKE 1 TABLET BY MOUTH EVERY DAY 30 tablet 0   No current facility-administered medications for this visit.    Allergies: Allergies  Allergen Reactions  . Iohexol      Code: RASH, Desc: [place] ON PT'S CHART ALLERGIC TO IV DYE 09/04/07/RM, Onset Date: [id number]     Social History: The patient  reports that she has  never smoked. She has never used smokeless tobacco. She reports that she does not drink alcohol  or use illicit drugs.   Family History: The patient's family history includes Asthma in her other; Cancer - Other in her mother; Diabetes in her mother and sister; Heart disease in her mother; Hypertension in her father and mother; Kidney disease in her paternal grandmother; Lymphoma in her father; Rheum arthritis in her father and mother; Stomach cancer in her mother.   Review of Systems: Please see the history of present illness.   Otherwise, the review of systems is positive for appetite change, DOE, & wheezing.   All other systems are reviewed and negative.   Physical Exam: VS:  BP 122/72 mmHg  Pulse 56  Ht 5\' 6"  (1.676 m)  Wt 205 lb (92.987 kg)  BMI 33.10 kg/m2  SpO2 92% .  BMI Body mass index is 33.1 kg/(m^2).  Wt Readings from Last 3 Encounters:  10/25/14 205 lb (92.987 kg)  09/21/14 206 lb (93.441 kg)  08/18/14 206 lb (93.441 kg)    General: Pleasant. Well developed, well nourished and in no acute distress. She is obese.  HEENT: Normal. Neck: Supple, no JVD, carotid bruits, or masses noted.  Cardiac: Regular rate and rhythm. No murmurs, rubs, or gallops. No edema.  Respiratory:  Lungs are clear to auscultation bilaterally with normal work of breathing.  GI: Soft and nontender.  MS: No deformity or atrophy. Gait not tested. Prosthetic left leg. Right leg with just trace edema.  Skin: Warm and dry. Color is normal.  Neuro:  Strength and sensation are intact and no gross focal deficits noted.  Psych: Alert, appropriate and with normal affect.   LABORATORY DATA:  EKG:  EKG is ordered today. This demonstrates NSR - septal Q's, diffuse ST and T wave changes. Unchanged.   Lab Results  Component Value Date   WBC 7.3 08/18/2014   HGB 11.9* 08/18/2014   HCT 37.3 08/18/2014   PLT 349.0 08/18/2014   GLUCOSE 82 07/27/2014   CHOL 118 08/18/2014   TRIG 96.0 08/18/2014   HDL 68.30 08/18/2014   LDLDIRECT 123.0 07/06/2010   LDLCALC 31 08/18/2014   ALT 11 08/18/2014   AST 21  08/18/2014   NA 142 07/27/2014   K 3.6 07/27/2014   CL 106 07/27/2014   CREATININE 1.52* 07/27/2014   BUN 23 07/27/2014   CO2 25 07/27/2014   TSH 1.30 08/18/2014   INR 1.18 06/18/2014   HGBA1C 7.1* 08/18/2014   MICROALBUR 3.2* 01/08/2013    BNP (last 3 results)  Recent Labs  06/17/14 1330 06/17/14 1946  BNP 1951.5* 1985.7*    ProBNP (last 3 results)  Recent Labs  11/15/13 1205 02/17/14 1202 06/08/14 1617  PROBNP 158.0* 768.0* 1249.0*     Other Studies Reviewed Today:  Echo Study Conclusions from 06/2014  - Left ventricle: The cavity size was normal. There was moderate concentric hypertrophy. Systolic function was normal. The estimated ejection fraction was in the range of 50% to 55%. Wall motion was normal; there were no regional wall motion abnormalities. Doppler parameters are consistent with a reversible restrictive pattern, indicative of decreased left ventricular diastolic compliance and/or increased left atrial pressure (grade 3 diastolic dysfunction). - Aortic valve: Cusp separation was reduced. - Mitral valve: Moderately calcified annulus. - Left atrium: The atrium was mildly dilated.   Cardiac Catheterization Operative Report  MARGA GRAMAJO [id number] 1/6/20164:19 PM Renato Shin, MD  Procedure Performed:  1. Left Heart Catheterization 2. Selective  Coronary Angiography  Operator: Lauree Chandler, MD  Arterial access site: Left radial artery.   Indication: 81 yo female with known CAD admitted with hypertensive crisis, elevated troponin with flat trend. Stress myoview with possible inferolateral ischemia. Pt with renal insufficiency. Plans today for diagnostic only cath to define CAD.   Procedure Details: The risks, benefits, complications, treatment options, and expected outcomes were discussed with the patient. The patient and/or family concurred with the proposed plan, giving  informed consent. The patient was brought to the cath lab after IV hydration was begun and oral premedication was given. The patient was further sedated with Versed and Fentanyl. The left wrist was assessed with a modified Allens test which was positive. The left wrist was prepped and draped in a sterile fashion. 1% lidocaine was used for local anesthesia. Using the modified Seldinger access technique, a 5 French sheath was placed in the left radial artery. 3 mg Verapamil was given through the sheath. 5000 units IV heparin was given. Standard diagnostic catheters were used to perform selective coronary angiography. The JR4 catheter was used to cross the aortic valve and LV pressures were measured. The sheath was removed from the right radial artery and a Terumo hemostasis band was applied at the arteriotomy site on the right wrist.   There were no immediate complications. The patient was taken to the recovery area in stable condition.   Hemodynamic Findings: Central aortic pressure: 144/56 Left ventricular pressure: 141/13/23  Angiographic Findings:  Left main: No obstructive disease.   Left Anterior Descending Artery: Large caliber vessel that courses to the apex. The proximal and mid vessel is calcified. The proximal vessel has diffuse 20% stenosis. The mid vessel has diffuse 30% stenosis. The distal vessel has mild plaque. The small to moderate caliber diagonal branch has ostial 30% stenosis.   Circumflex Artery: Large caliber vessel with large caliber obtuse marginal branch. The distal AV groove Circumflex has a 70% stenosis just after the takeoff of OM1. (this has progressed from the prior cath).   Right Coronary Artery: Large dominant vessel with diffuse 50% proximal to mid stenosis. Appears unchanged from last cath.   Left Ventricular Angiogram: Deferred.   45cc contrast used.   Impression: 1. Moderate non-obstructive disease in the RCA and LAD 2. Moderately severe distal  Circumflex artery stenosis  Recommendations: I would recommend medical management of her distal Circumflex stenosis. The lesion is just past the takeoff of the large caliber OM branch and PCI would potentially compromise flow into the larger OM branch. Given her renal insufficiency, will hydrate aggressively post-cath. If she continues to have angina despite optimal medical therapy, could consider PCI of the distal AV groove Circumflex stenosis.    Complications: None. The patient tolerated the procedure well.      ASSESSMENT AND PLAN: 1. NSTEMI - to manage medically. She is doing well clinically. She has no symptoms. Would continue with current regimen.  2. HTN - BP controlled, continue the same regimen.  3. CKD - holding off on restarting her ACE due to last BMET  4. Bradycardia - on low dose Bystolic.   5. Diastolic HF - looks fairly well compensated. I have left her on her current regimen.   6. Erythema of the 5. toe can be sec to ingrown toenail, she is referred to her podiatrist.    Disposition:   FU with Dr. Meda Coffee in 3 months.   Patient is agreeable to this plan and will call if any problems develop in the  interim.   Signed: Dorothy Spark, MD  10/25/2014 10:37 AM  [facility] [address] [place], [place]  [address] Phone: [phone] Fax: [phone]

## 2014-11-03 ENCOUNTER — Other Ambulatory Visit: Payer: Self-pay | Admitting: Endocrinology

## 2014-11-06 ENCOUNTER — Other Ambulatory Visit: Payer: Self-pay | Admitting: Endocrinology

## 2014-11-10 ENCOUNTER — Other Ambulatory Visit: Payer: Self-pay | Admitting: Endocrinology

## 2014-11-18 ENCOUNTER — Ambulatory Visit (INDEPENDENT_AMBULATORY_CARE_PROVIDER_SITE_OTHER): Payer: Medicare Other | Admitting: Endocrinology

## 2014-11-18 ENCOUNTER — Encounter: Payer: Self-pay | Admitting: Endocrinology

## 2014-11-18 VITALS — BP 122/70 | HR 72 | Temp 98.2°F | Ht 66.0 in | Wt 202.0 lb

## 2014-11-18 DIAGNOSIS — E1165 Type 2 diabetes mellitus with hyperglycemia: Secondary | ICD-10-CM

## 2014-11-18 DIAGNOSIS — E1129 Type 2 diabetes mellitus with other diabetic kidney complication: Secondary | ICD-10-CM

## 2014-11-18 DIAGNOSIS — IMO0002 Reserved for concepts with insufficient information to code with codable children: Secondary | ICD-10-CM

## 2014-11-18 LAB — HEMOGLOBIN A1C: Hgb A1c MFr Bld: 6.3 % (ref 4.6–6.5)

## 2014-11-18 NOTE — Patient Instructions (Addendum)
blood tests are being requested for you today.  We'll let you know about the results.  For your safety, please take only the the prescribed amount of insulin (18 with breakfast, and 10 with supper).  please call if the blood sugar is often over 200 on this, so we can increase it.  check your blood sugar 4 times a day.  vary the time of day when you check, between before the 3 meals, and at bedtime.  also check if you have symptoms of your blood sugar being too high or too low.  please keep a record of the readings and bring it to your next appointment here.  You can write it on any piece of paper.  please call us sooner if your blood sugar goes below 70, or if you have a lot of readings over 200.   Please come back for a follow-up appointment in 3 months.  To regulate the bowels, take the stool softener every day.

## 2014-11-18 NOTE — Progress Notes (Signed)
Subjective:    Patient ID: [name], female    DOB: Nov 08, 1929, 81 y.o.   MRN: [id number]  HPI Pt returns for f/u of diabetes mellitus: DM type: Insulin-requiring type 2 Dx'ed: 8841 Complications: polyneuropathy, nephropathy, CAD, and PAD.  Therapy: insulin since 1991 GDM: never DKA: never Severe hypoglycemia: last episode was in 2014 Pancreatitis: never Other: therapy is limited by pt's request for least expensive insulin, and by her need for a simple regimen; husband administers insulin to her. Interval history: pt is from pt and husb.  He says she takes 25 units qam, and 15-20 units in the evening.  no cbg record, but states cbg's are well-controlled.  He says pt has frequent mild hypoglycemia at night. Past Medical History  Diagnosis Date  . Uterine cancer   . Diabetes mellitus     type II; peripheral neuropathy  . Hypertension   . GERD (gastroesophageal reflux disease)   . Peripheral vascular disease     s/p L BKA 08/2012  . Obesity   . Dyslipidemia   . Diastolic CHF, chronic     EF 50-55%, mild LVH and grade 1 diast. Dysfxn  . Osteoarthritis cervical spine   . DM retinopathy   . Sleep apnea     with CPAP  . History of shingles   . COPD (chronic obstructive pulmonary disease)   . Depression   . Peptic ulcer disease     duodenal  . Peptic stricture of esophagus   . Hiatal hernia   . Diverticulosis   . Arthritis   . ASCVD (arteriosclerotic cardiovascular disease)     on MRI brin  . CAD (coronary artery disease)     a. s/p multiple caths with nonobs CAD;   b. cath 1/10: pLAD 20%, mLAD 40%, pCFX 20%, mCFX 40%, pRCA 60-70%;   c.  Myoview 06/02/12: Low anterior wall scar, no ischemia, EF 37%. d. cath 06/20/2014 70% mid LCx, medical therapy, high risk for PCI due to close proximity to large OM   . Cardiomyopathy     a. Echo 05/31/12: Severe LVH, EF 40-45%, diffuse HK, grade 1 diastolic dysfunction, mild to moderate calcified aortic valve annulus, mild to  moderate aortic stenosis, MAC, mild MR, moderate LAE  . Asthma     Mild  . Pruritic condition     Idiopathic  . Hyperlipidemia   . Zoster   . Noncompliance   . CKD (chronic kidney disease)     Dr. Lorrene Reid    Past Surgical History  Procedure Laterality Date  . Tubal ligation  1967  . Knee arthroscopy  10/1998    Left  . Craniotomy  1997    Left for SDH  . Cataract extraction, bilateral  2005  . Hernia repair    . Esophagogastroduodenoscopy  04/04/2004  . Spine surgery      C-spine and lumbar surgery  . Cholecystectomy  2010  . Cardiac catheterization    . Dexa  7/05  . Amputation Left 09/04/2012    Procedure: AMPUTATION BELOW KNEE;  Surgeon: Angelia Mould, MD;  Location: [facility];  Service: Vascular;  Laterality: Left;  . Abdominal angiogram N/A 08/31/2012    Procedure: ABDOMINAL ANGIOGRAM with runoff poss intervention;  Surgeon: Angelia Mould, MD;  Location: [facility] CATH LAB;  Service: Cardiovascular;  Laterality: N/A;  . Tubal ligation    . Left heart catheterization with coronary angiogram N/A 06/22/2014    Procedure: LEFT HEART CATHETERIZATION WITH CORONARY ANGIOGRAM;  Surgeon:  Burnell Blanks, MD;  Location: [facility] CATH LAB;  Service: Cardiovascular;  Laterality: N/A;  . Eye surgery    . Brain surgery      1997 blood clot on the brain, then had to relieve fluid on the brain  . Multiple extractions with alveoloplasty N/A 08/01/2014    Procedure: EXTRACTIONS OF TEETH NUMBERS 7 8 9 10 11  AND 19 AND ALVEOLOPLASTY UPPER LEFT AND RIGHT QUADRANT;  Surgeon: Isac Caddy, DDS;  Location: [facility];  Service: Oral Surgery;  Laterality: N/A;    History   Social History  . Marital Status: Married    Spouse Name: N/A  . Number of Children: 7  . Years of Education: N/A   Occupational History  . Retired    Social History Main Topics  . Smoking status: Never Smoker   . Smokeless tobacco: Never Used  . Alcohol Use: No     Comment: rare  . Drug Use: No  . Sexual  Activity: No   Other Topics Concern  . Not on file   Social History Narrative    Current Outpatient Prescriptions on File Prior to Visit  Medication Sig Dispense Refill  . acetaminophen (TYLENOL) 500 MG tablet Take 500 mg by mouth every 6 (six) hours as needed for pain. For pain    . albuterol (PROVENTIL HFA;VENTOLIN HFA) 108 (90 BASE) MCG/ACT inhaler Inhale 2 puffs into the lungs every 4 (four) hours as needed for wheezing or shortness of breath. 1 Inhaler 3  . albuterol (PROVENTIL) (2.5 MG/3ML) 0.083% nebulizer solution Take 3 mLs (2.5 mg total) by nebulization every 4 (four) hours as needed for wheezing or shortness of breath. 75 mL 12  . allopurinol (ZYLOPRIM) 100 MG tablet TAKE 1 TABLET BY MOUTH DAILY 30 tablet 0  . amLODipine (NORVASC) 10 MG tablet Take 1 tablet (10 mg total) by mouth daily. 30 tablet 5  . aspirin 81 MG chewable tablet Chew 1 tablet (81 mg total) by mouth daily.    Marland Kitchen atorvastatin (LIPITOR) 40 MG tablet Take 1 tablet (40 mg total) by mouth daily at 6 PM. 30 tablet 5  . calcitRIOL (ROCALTROL) 0.25 MCG capsule TAKE ONE CAPSULE BY MOUTH EVERY DAY 30 capsule 0  . clopidogrel (PLAVIX) 75 MG tablet Take 1 tablet (75 mg total) by mouth daily. 30 tablet 11  . docusate sodium (COLACE) 100 MG capsule Take 100 mg by mouth 2 (two) times daily as needed for mild constipation (constipation).     Marland Kitchen donepezil (ARICEPT) 5 MG tablet Take 1 tablet (5 mg total) by mouth at bedtime. 30 tablet 11  . ezetimibe (ZETIA) 10 MG tablet Take 1 tablet (10 mg total) by mouth every morning. 30 tablet 11  . feeding supplement (GLUCERNA SHAKE) LIQD Take 237 mLs by mouth 2 (two) times daily.    . Fluticasone-Salmeterol (ADVAIR DISKUS) 100-50 MCG/DOSE AEPB Inhale 1 puff into the lungs 2 (two) times daily. 1 each 11  . furosemide (LASIX) 80 MG tablet Take 0.5 tablets (40 mg total) by mouth daily. 90 tablet 3  . gabapentin (NEURONTIN) 300 MG capsule TAKE 1 CAPSULE BY MOUTH AT BEDTIME 30 capsule 0  .  hydrALAZINE (APRESOLINE) 25 MG tablet TAKE 1 TABLET BY MOUTH THREE TIMES DAILY 90 tablet 0  . HYDROcodone-acetaminophen (NORCO) 10-325 MG per tablet Take one tablet by mouth every 4 hours as needed for pain 180 tablet 0  . isosorbide mononitrate (IMDUR) 60 MG 24 hr tablet Take 1 tablet (60 mg total)  by mouth daily. 30 tablet 5  . latanoprost (XALATAN) 0.005 % ophthalmic solution Place 1 drop into both eyes at bedtime.    . nebivolol (BYSTOLIC) 2.5 MG tablet Take 1 tablet (2.5 mg total) by mouth daily. 30 tablet 5  . nitroGLYCERIN (NITROSTAT) 0.4 MG SL tablet Place 0.4 mg under the tongue every 5 (five) minutes as needed for chest pain.     Marland Kitchen NOVOLIN 70/30 (70-30) 100 UNIT/ML injection INJECT 18 UNITS UNDER THE SKIN WITH BREAKFAST AND THEN 10 UNITS WITH THE EVENING MEAL 10 mL 0  . ONE TOUCH ULTRA TEST test strip USE TO TEST BLOOD SUGAR TWICE DAILY 100 each 2  . pantoprazole (PROTONIX) 40 MG tablet TAKE 1 TABLET BY MOUTH TWICE DAILY 60 tablet 0  . potassium chloride SA (K-DUR,KLOR-CON) 20 MEQ tablet Take 1 tablet (20 mEq total) by mouth daily. 90 tablet 3  . sertraline (ZOLOFT) 100 MG tablet TAKE 1 TABLET BY MOUTH EVERY DAY 30 tablet 0   No current facility-administered medications on file prior to visit.    Allergies  Allergen Reactions  . Iohexol      Code: RASH, Desc: [place] ON PT'S CHART ALLERGIC TO IV DYE 09/04/07/RM, Onset Date: [id number]     Family History  Problem Relation Age of Onset  . Cancer - Other Mother     "Stomach" Cancer  . Diabetes Mother   . Heart disease Mother   . Stomach cancer Mother   . Hypertension Mother   . Lymphoma Father   . Hypertension Father   . Kidney disease Paternal Grandmother   . Asthma Other   . Diabetes Sister   . Rheum arthritis Mother   . Rheum arthritis Father     BP 122/70 mmHg  Pulse 72  Temp(Src) 98.2 F (36.8 C) (Oral)  Ht 5\' 6"  (1.676 m)  Wt 202 lb (91.627 kg)  BMI 32.62 kg/m2  SpO2 90%    Review of Systems Denies  LOC.  She has chronic alternating constipation and diarrhea.  She does not take colace    Objective:   Physical Exam VITAL SIGNS:  See vs page GENERAL: no distress.  In wheelchair.  LLE: BKA.   Right foot: no deformity.  There is bilateral onychomycosis.  Skin: no ulcer, and normal color and temp, on the right foot.  Neuro: sensation is intact to touch on the right foot.   CV: 1+ right leg edema.   Lab Results  Component Value Date   HGBA1C 6.3 11/18/2014       Assessment & Plan:  DM: overcontrolled Noncompliance with cbg recording and insulin dosing: I told pt and husband this is dangerous, especially with her state of debility. Alternating constipation and diarrhea, uncertain etiology.  Patient is advised the following: Patient Instructions  blood tests are being requested for you today.  We'll let you know about the results.  For your safety, please take only the the prescribed amount of insulin (18 with breakfast, and 10 with supper).  please call if the blood sugar is often over 200 on this, so we can increase it.  check your blood sugar 4 times a day.  vary the time of day when you check, between before the 3 meals, and at bedtime.  also check if you have symptoms of your blood sugar being too high or too low.  please keep a record of the readings and bring it to your next appointment here.  You can write it on any  piece of paper.  please call us sooner if your blood sugar goes below 70, or if you have a lot of readings over 200.   Please come back for a follow-up appointment in 3 months.  To regulate the bowels, take the stool softener every day.

## 2014-11-21 ENCOUNTER — Other Ambulatory Visit: Payer: Self-pay | Admitting: Endocrinology

## 2014-11-29 ENCOUNTER — Other Ambulatory Visit: Payer: Self-pay | Admitting: Endocrinology

## 2014-12-02 ENCOUNTER — Other Ambulatory Visit: Payer: Self-pay | Admitting: Endocrinology

## 2014-12-06 ENCOUNTER — Other Ambulatory Visit: Payer: Self-pay | Admitting: Endocrinology

## 2014-12-09 ENCOUNTER — Other Ambulatory Visit: Payer: Self-pay | Admitting: Physician Assistant

## 2014-12-09 ENCOUNTER — Other Ambulatory Visit: Payer: Self-pay | Admitting: Endocrinology

## 2014-12-09 NOTE — Telephone Encounter (Signed)
Will route this message to Dr Meda Coffee to advise on refill request of listed medications.

## 2014-12-09 NOTE — Telephone Encounter (Signed)
Please review for refill, [place] pt. 

## 2014-12-15 ENCOUNTER — Encounter: Payer: Self-pay | Admitting: Cardiology

## 2014-12-15 ENCOUNTER — Ambulatory Visit (INDEPENDENT_AMBULATORY_CARE_PROVIDER_SITE_OTHER): Payer: Medicare Other | Admitting: Cardiology

## 2014-12-15 VITALS — BP 124/56 | HR 65 | Ht 66.0 in | Wt 199.0 lb

## 2014-12-15 DIAGNOSIS — I5032 Chronic diastolic (congestive) heart failure: Secondary | ICD-10-CM

## 2014-12-15 DIAGNOSIS — I10 Essential (primary) hypertension: Secondary | ICD-10-CM | POA: Diagnosis not present

## 2014-12-15 DIAGNOSIS — E785 Hyperlipidemia, unspecified: Secondary | ICD-10-CM | POA: Diagnosis not present

## 2014-12-15 DIAGNOSIS — I2510 Atherosclerotic heart disease of native coronary artery without angina pectoris: Secondary | ICD-10-CM | POA: Diagnosis not present

## 2014-12-15 DIAGNOSIS — I2583 Coronary atherosclerosis due to lipid rich plaque: Secondary | ICD-10-CM

## 2014-12-15 NOTE — Patient Instructions (Signed)
Your physician recommends that you continue on your current medications as directed. Please refer to the Current Medication list given to you today.     Your physician wants you to follow-up in: 6 MONTHS WITH DR NELSON You will receive a reminder letter in the mail two months in advance. If you don't receive a letter, please call our office to schedule the follow-up appointment.  

## 2014-12-15 NOTE — Progress Notes (Signed)
Patient ID: [name], female   DOB: August 29, 1929, 81 y.o.   MRN: [id number]     CARDIOLOGY OFFICE NOTE  Date:  12/15/2014   Sigmund Hazel Date of Birth: [birth year] Medical Record #[id number]  PCP:  Renato Shin, MD  Cardiologist:  Meda Coffee    Chief complain; follow up for CAD  History of Present Illness: [name] is a 81 y.o. female who presents today for a pre op clearance visit. Seen for Dr. Meda Coffee.  She has a history of diastolic heart failure, prior nonobstructive CAD, DM2, HTN, HL, sleep apnea, PVD with prior L BKA and CKD (followed by Dr. Lorrene Reid).   Last office follow-up with Dr. Meda Coffee on 06/08/2014, patient complained of worsening lower extremity edema and progressive shortness of breath for the last 2-3 month. Of note, her Lasix was decreased in October due to worsening symptoms creatinine. She also complained of orthopnea or paroxysmal nocturnal dyspnea at the time. Dr. Meda Coffee had recommended a repeat nuclear stress test to assess for ischemia however, the study was not completed.   Patient presented to [facility] 2016 with complaint of chest pain radiating to her arm and associated shortness of breath, nausea and diaphoresis. She was ruled in for NTEMI, she was seen by cardiology team and that was subsequently transferred from [facility]. Myoview was initially obtained on 06/20/2014 for risk stratification which showed EF 44%, small mild reversible defect in the inferolateral wall which could be to breast attenuation versus ischemia, mild LV dysfunction with global hypokinesis, intermediate risk stress test finding. Plavix was added for NSTEMI. She had lots of issues with BP management and hyperkalemia while hospitalized. She did have cardiac cath which showed 70% stenosis in distal AV circumflex groove after takeoff of OM1, 20% oxygen LAD, 30% mid LAD, diffuse 50% proximal to mid RCA stenosis. LV gram was deferred given her high risk for contrast  nephropathy. Medical regiment was recommended for her distal circumflex stenosis given the close proximity to large caliber OM branch and high risk of occluding the OM branch with intervention. Per cath note, if she has recurrent angina symptom, high risk PCI of left circumflex artery stenosis can be considered. Echocardiogram obtained showed EF 50-55%, with grade 3 diastolic dysfunction. Post cath, patient has some contrast nephropathy with creatinine peaked on 1/8 with 2.13. She has had significant bradycardia but able to be discharged on low dose Bystolic. Spironolactone was deemed not a good choice for this patient with her intermittent hyperkalemia. Her Lasix continued to be held at discharge as well as ACE.   I saw her back last month - she had only been home a few days but doing ok. I added back her diuretic and potassium but at half dose.   11/25/2014 - 2 months follow up, comes with her husband Arnell Sieving. She underwent eye surgery for bleeding without complications. She denies chest pain and has stable exertional SOB when she walks with a walker. She occasionally wakes up at night with PND, denies RLE edema, left -prosthesis. Denies palpitation, syncope or dizziness. Complaint with meds.   Past Medical History  Diagnosis Date  . Uterine cancer   . Diabetes mellitus     type II; peripheral neuropathy  . Hypertension   . GERD (gastroesophageal reflux disease)   . Peripheral vascular disease     s/p L BKA 08/2012  . Obesity   . Dyslipidemia   . Diastolic CHF, chronic     EF 50-55%, mild LVH  and grade 1 diast. Dysfxn  . Osteoarthritis cervical spine   . DM retinopathy   . Sleep apnea     with CPAP  . History of shingles   . COPD (chronic obstructive pulmonary disease)   . Depression   . Peptic ulcer disease     duodenal  . Peptic stricture of esophagus   . Hiatal hernia   . Diverticulosis   . Arthritis   . ASCVD (arteriosclerotic cardiovascular disease)     on MRI brin  .  CAD (coronary artery disease)     a. s/p multiple caths with nonobs CAD;   b. cath 1/10: pLAD 20%, mLAD 40%, pCFX 20%, mCFX 40%, pRCA 60-70%;   c.  Myoview 06/02/12: Low anterior wall scar, no ischemia, EF 37%. d. cath 06/20/2014 70% mid LCx, medical therapy, high risk for PCI due to close proximity to large OM   . Cardiomyopathy     a. Echo 05/31/12: Severe LVH, EF 40-45%, diffuse HK, grade 1 diastolic dysfunction, mild to moderate calcified aortic valve annulus, mild to moderate aortic stenosis, MAC, mild MR, moderate LAE  . Asthma     Mild  . Pruritic condition     Idiopathic  . Hyperlipidemia   . Zoster   . Noncompliance   . CKD (chronic kidney disease)     Dr. Lorrene Reid    Past Surgical History  Procedure Laterality Date  . Tubal ligation  1967  . Knee arthroscopy  10/1998    Left  . Craniotomy  1997    Left for SDH  . Cataract extraction, bilateral  2005  . Hernia repair    . Esophagogastroduodenoscopy  04/04/2004  . Spine surgery      C-spine and lumbar surgery  . Cholecystectomy  2010  . Cardiac catheterization    . Dexa  7/05  . Amputation Left 09/04/2012    Procedure: AMPUTATION BELOW KNEE;  Surgeon: Angelia Mould, MD;  Location: [place];  Service: Vascular;  Laterality: Left;  . Abdominal angiogram N/A 08/31/2012    Procedure: ABDOMINAL ANGIOGRAM with runoff poss intervention;  Surgeon: Angelia Mould, MD;  Location: [facility] C[facility] LAB;  Service: Cardiovascular;  Laterality: N/A;  . Tubal ligation    . Left heart catheterization with coronary angiogram N/A 06/22/2014    Procedure: LEFT HEART CATHETERIZATION WITH CORONARY ANGIOGRAM;  Surgeon: Burnell Blanks, MD;  Location: [facility] CATH LAB;  Service: Cardiovascular;  Laterality: N/A;  . Eye surgery    . Brain surgery      1997 blood clot on the brain, then had to relieve fluid on the brain  . Multiple extractions with alveoloplasty N/A 08/01/2014    Procedure: EXTRACTIONS OF TEETH NUMBERS 7 8 9 10 11  AND 19 AND  ALVEOLOPLASTY UPPER LEFT AND RIGHT QUADRANT;  Surgeon: Isac Caddy, DDS;  Location: [place];  Service: Oral Surgery;  Laterality: N/A;     Medications: Current Outpatient Prescriptions  Medication Sig Dispense Refill  . acetaminophen (TYLENOL) 500 MG tablet Take 500 mg by mouth every 6 (six) hours as needed for pain. For pain    . albuterol (PROVENTIL HFA;VENTOLIN HFA) 108 (90 BASE) MCG/ACT inhaler Inhale 2 puffs into the lungs every 4 (four) hours as needed for wheezing or shortness of breath. 1 Inhaler 3  . albuterol (PROVENTIL) (2.5 MG/3ML) 0.083% nebulizer solution Take 3 mLs (2.5 mg total) by nebulization every 4 (four) hours as needed for wheezing or shortness of breath. 75 mL 12  .  allopurinol (ZYLOPRIM) 100 MG tablet TAKE 1 TABLET BY MOUTH DAILY 30 tablet 0  . amLODipine (NORVASC) 10 MG tablet TAKE 1 TABLET BY MOUTH DAILY 30 tablet 0  . aspirin 81 MG chewable tablet Chew 1 tablet (81 mg total) by mouth daily.    Marland Kitchen atorvastatin (LIPITOR) 40 MG tablet TAKE 1 TABLET BY MOUTH DAILY AT 6 PM 30 tablet 0  . calcitRIOL (ROCALTROL) 0.25 MCG capsule TAKE ONE CAPSULE BY MOUTH EVERY DAY 30 capsule 0  . clopidogrel (PLAVIX) 75 MG tablet Take 1 tablet (75 mg total) by mouth daily. 30 tablet 11  . docusate sodium (COLACE) 100 MG capsule Take 100 mg by mouth 2 (two) times daily as needed for mild constipation (constipation).     Marland Kitchen donepezil (ARICEPT) 5 MG tablet Take 1 tablet (5 mg total) by mouth at bedtime. 30 tablet 11  . ezetimibe (ZETIA) 10 MG tablet Take 1 tablet (10 mg total) by mouth every morning. 30 tablet 11  . feeding supplement (GLUCERNA SHAKE) LIQD Take 237 mLs by mouth 2 (two) times daily.    . Fluticasone-Salmeterol (ADVAIR DISKUS) 100-50 MCG/DOSE AEPB Inhale 1 puff into the lungs 2 (two) times daily. 1 each 11  . furosemide (LASIX) 80 MG tablet Take 0.5 tablets (40 mg total) by mouth daily. 90 tablet 3  . gabapentin (NEURONTIN) 300 MG capsule TAKE 1 CAPSULE BY MOUTH AT BEDTIME  30 capsule 0  . hydrALAZINE (APRESOLINE) 25 MG tablet TAKE 1 TABLET BY MOUTH THREE TIMES DAILY 90 tablet 0  . HYDROcodone-acetaminophen (NORCO) 10-325 MG per tablet Take one tablet by mouth every 4 hours as needed for pain 180 tablet 0  . isosorbide mononitrate (IMDUR) 60 MG 24 hr tablet TAKE 1 TABLET BY MOUTH DAILY 30 tablet 0  . latanoprost (XALATAN) 0.005 % ophthalmic solution Place 1 drop into both eyes at bedtime.    . nebivolol (BYSTOLIC) 2.5 MG tablet Take 1 tablet (2.5 mg total) by mouth daily. 30 tablet 5  . nitroGLYCERIN (NITROSTAT) 0.4 MG SL tablet Place 0.4 mg under the tongue every 5 (five) minutes as needed for chest pain.     Marland Kitchen NOVOLIN 70/30 (70-30) 100 UNIT/ML injection INJECT 18 UNITS UNDER THE SKIN WITH BREAKFAST AND THEN 10 UNITS WITH THE EVENING MEAL 10 mL 0  . ONE TOUCH ULTRA TEST test strip USE TO TEST BLOOD SUGAR TWICE DAILY. 100 each 3  . pantoprazole (PROTONIX) 40 MG tablet TAKE 1 TABLET BY MOUTH TWICE DAILY 60 tablet 0  . potassium chloride SA (K-DUR,KLOR-CON) 20 MEQ tablet Take 1 tablet (20 mEq total) by mouth daily. 90 tablet 3  . sertraline (ZOLOFT) 100 MG tablet TAKE 1 TABLET BY MOUTH EVERY DAY 30 tablet 0   No current facility-administered medications for this visit.    Allergies: Allergies  Allergen Reactions  . Iohexol      Code: RASH, Desc: [place] ON PT'S CHART ALLERGIC TO IV DYE 09/04/07/RM, Onset Date: [id number]     Social History: The patient  reports that she has never smoked. She has never used smokeless tobacco. She reports that she does not drink alcohol or use illicit drugs.   Family History: The patient's family history includes Asthma in her other; Cancer - Other in her mother; Diabetes in her mother and sister; Heart disease in her mother; Hypertension in her father and mother; Kidney disease in her paternal grandmother; Lymphoma in her father; Rheum arthritis in her father and mother; Stomach cancer in her mother.  Review of  Systems: Please see the history of present illness.   Otherwise, the review of systems is positive for appetite change, DOE, & wheezing.   All other systems are reviewed and negative.   Physical Exam: VS:  BP 124/56 mmHg  Pulse 65  Ht 5\' 6"  (1.676 m)  Wt 199 lb (90.266 kg)  BMI 32.13 kg/m2 .  BMI Body mass index is 32.13 kg/(m^2).  Wt Readings from Last 3 Encounters:  12/15/14 199 lb (90.266 kg)  11/18/14 202 lb (91.627 kg)  10/25/14 205 lb (92.987 kg)    General: Pleasant. Well developed, well nourished and in no acute distress. She is obese.  HEENT: Normal. Neck: Supple, no JVD, carotid bruits, or masses noted.  Cardiac: Regular rate and rhythm. No murmurs, rubs, or gallops. No edema.  Respiratory:  Lungs are clear to auscultation bilaterally with normal work of breathing.  GI: Soft and nontender.  MS: No deformity or atrophy. Gait not tested. Prosthetic left leg. Right leg with just trace edema.  Skin: Warm and dry. Color is normal.  Neuro:  Strength and sensation are intact and no gross focal deficits noted.  Psych: Alert, appropriate and with normal affect.   LABORATORY DATA:  EKG:  EKG is ordered today. This demonstrates NSR - septal Q's, diffuse ST and T wave changes. Unchanged.   Lab Results  Component Value Date   WBC 7.3 08/18/2014   HGB 11.9* 08/18/2014   HCT 37.3 08/18/2014   PLT 349.0 08/18/2014   GLUCOSE 231* 10/25/2014   CHOL 118 08/18/2014   TRIG 96.0 08/18/2014   HDL 68.30 08/18/2014   LDLDIRECT 123.0 07/06/2010   LDLCALC 31 08/18/2014   ALT 10 10/25/2014   AST 17 10/25/2014   NA 140 10/25/2014   K 3.8 10/25/2014   CL 105 10/25/2014   CREATININE 1.54* 10/25/2014   BUN 24* 10/25/2014   CO2 28 10/25/2014   TSH 1.30 08/18/2014   INR 1.18 06/18/2014   HGBA1C 6.3 11/18/2014   MICROALBUR 3.2* 01/08/2013    BNP (last 3 results)  Recent Labs  06/17/14 1330 06/17/14 1946  BNP 1951.5* 1985.7*    ProBNP (last 3 results)  Recent Labs   02/17/14 1202 06/08/14 1617  PROBNP 768.0* 1249.0*     Other Studies Reviewed Today:  Echo Study Conclusions from 06/2014  - Left ventricle: The cavity size was normal. There was moderate concentric hypertrophy. Systolic function was normal. The estimated ejection fraction was in the range of 50% to 55%. Wall motion was normal; there were no regional wall motion abnormalities. Doppler parameters are consistent with a reversible restrictive pattern, indicative of decreased left ventricular diastolic compliance and/or increased left atrial pressure (grade 3 diastolic dysfunction). - Aortic valve: Cusp separation was reduced. - Mitral valve: Moderately calcified annulus. - Left atrium: The atrium was mildly dilated.   Cardiac Catheterization Operative Report  SHAELA BOER [id number] 1/6/20164:19 PM Renato Shin, MD  Procedure Performed:  1. Left Heart Catheterization 2. Selective Coronary Angiography  Operator: Lauree Chandler, MD  Arterial access site: Left radial artery.   Indication: 81 yo female with known CAD admitted with hypertensive crisis, elevated troponin with flat trend. Stress myoview with possible inferolateral ischemia. Pt with renal insufficiency. Plans today for diagnostic only cath to define CAD.   Procedure Details: The risks, benefits, complications, treatment options, and expected outcomes were discussed with the patient. The patient and/or family concurred with the proposed plan, giving informed consent. The patient was brought to  the cath lab after IV hydration was begun and oral premedication was given. The patient was further sedated with Versed and Fentanyl. The left wrist was assessed with a modified Allens test which was positive. The left wrist was prepped and draped in a sterile fashion. 1% lidocaine was used for local anesthesia. Using the modified Seldinger access technique, a 5 French  sheath was placed in the left radial artery. 3 mg Verapamil was given through the sheath. 5000 units IV heparin was given. Standard diagnostic catheters were used to perform selective coronary angiography. The JR4 catheter was used to cross the aortic valve and LV pressures were measured. The sheath was removed from the right radial artery and a Terumo hemostasis band was applied at the arteriotomy site on the right wrist.   There were no immediate complications. The patient was taken to the recovery area in stable condition.   Hemodynamic Findings: Central aortic pressure: 144/56 Left ventricular pressure: 141/13/23  Angiographic Findings:  Left main: No obstructive disease.   Left Anterior Descending Artery: Large caliber vessel that courses to the apex. The proximal and mid vessel is calcified. The proximal vessel has diffuse 20% stenosis. The mid vessel has diffuse 30% stenosis. The distal vessel has mild plaque. The small to moderate caliber diagonal branch has ostial 30% stenosis.   Circumflex Artery: Large caliber vessel with large caliber obtuse marginal branch. The distal AV groove Circumflex has a 70% stenosis just after the takeoff of OM1. (this has progressed from the prior cath).   Right Coronary Artery: Large dominant vessel with diffuse 50% proximal to mid stenosis. Appears unchanged from last cath.   Left Ventricular Angiogram: Deferred.   45cc contrast used.   Impression: 1. Moderate non-obstructive disease in the RCA and LAD 2. Moderately severe distal Circumflex artery stenosis  Recommendations: I would recommend medical management of her distal Circumflex stenosis. The lesion is just past the takeoff of the large caliber OM branch and PCI would potentially compromise flow into the larger OM branch. Given her renal insufficiency, will hydrate aggressively post-cath. If she continues to have angina despite optimal medical therapy, could consider PCI of the distal AV  groove Circumflex stenosis.    Complications: None. The patient tolerated the procedure well.      ASSESSMENT AND PLAN:  1. NSTEMI - to manage medically. She is doing well clinically. She has no symptoms. Would continue with current regimen. Advised that if her symptoms worsen we will consider PCI/LCX.   2. HTN - BP controlled, continue the same regimen.  3. CKD - holding off on restarting her ACE due to last BMET  4. Bradycardia - on low dose Bystolic.   5. Diastolic HF - looks fairly well compensated, lost 6 lbs since the last visit, advised to take extra lasix 40 mg po daily on days with PND.   Disposition:   FU with Dr. Meda Coffee in 6 months.   Patient is agreeable to this plan and will call if any problems develop in the interim.   Signed: Dorothy Spark, MD  12/15/2014 11:57 AM  [facility] [address] [place], [place]  [address] Phone: [phone] Fax: [phone]

## 2014-12-25 ENCOUNTER — Other Ambulatory Visit: Payer: Self-pay | Admitting: Cardiology

## 2015-01-01 ENCOUNTER — Other Ambulatory Visit: Payer: Self-pay | Admitting: Endocrinology

## 2015-01-02 ENCOUNTER — Other Ambulatory Visit: Payer: Self-pay | Admitting: Endocrinology

## 2015-01-25 ENCOUNTER — Other Ambulatory Visit: Payer: Self-pay | Admitting: Endocrinology

## 2015-01-29 ENCOUNTER — Other Ambulatory Visit: Payer: Self-pay | Admitting: Endocrinology

## 2015-02-01 ENCOUNTER — Other Ambulatory Visit: Payer: Self-pay | Admitting: Endocrinology

## 2015-02-05 ENCOUNTER — Other Ambulatory Visit: Payer: Self-pay | Admitting: Cardiology

## 2015-02-15 ENCOUNTER — Other Ambulatory Visit: Payer: Self-pay | Admitting: Endocrinology

## 2015-02-16 ENCOUNTER — Telehealth: Payer: Self-pay | Admitting: Endocrinology

## 2015-02-16 NOTE — Telephone Encounter (Signed)
I contacted the pt and advised of note below. Pt voiced understanding and is coming in for a appointment tomorrow.

## 2015-02-16 NOTE — Telephone Encounter (Signed)
See note below and please advise, Thanks! 

## 2015-02-16 NOTE — Telephone Encounter (Signed)
Reduce insulin by half for now D/c furosemide for now. Ov here tomorrow am--go to ER sooner if you can't make it until tomorrow. Drink plenty of fluids--don't worry if the fluid contains sugar.

## 2015-02-16 NOTE — Telephone Encounter (Signed)
Patient called stating that she is not feeling well   + Nausea + Dizziness + Diarrhea  - Fever   Blood sugars are 71   Please advise   Thank you

## 2015-02-17 ENCOUNTER — Other Ambulatory Visit: Payer: Self-pay

## 2015-02-17 ENCOUNTER — Encounter: Payer: Self-pay | Admitting: Endocrinology

## 2015-02-17 ENCOUNTER — Ambulatory Visit (INDEPENDENT_AMBULATORY_CARE_PROVIDER_SITE_OTHER): Payer: Medicare Other | Admitting: Endocrinology

## 2015-02-17 ENCOUNTER — Other Ambulatory Visit (INDEPENDENT_AMBULATORY_CARE_PROVIDER_SITE_OTHER): Payer: Medicare Other

## 2015-02-17 VITALS — BP 139/62 | HR 62 | Temp 98.1°F | Ht 66.0 in | Wt 203.0 lb

## 2015-02-17 DIAGNOSIS — IMO0002 Reserved for concepts with insufficient information to code with codable children: Secondary | ICD-10-CM

## 2015-02-17 DIAGNOSIS — R197 Diarrhea, unspecified: Secondary | ICD-10-CM

## 2015-02-17 DIAGNOSIS — E1129 Type 2 diabetes mellitus with other diabetic kidney complication: Secondary | ICD-10-CM

## 2015-02-17 DIAGNOSIS — E1165 Type 2 diabetes mellitus with hyperglycemia: Secondary | ICD-10-CM

## 2015-02-17 LAB — CBC WITH DIFFERENTIAL/PLATELET
BASOS PCT: 0.5 % (ref 0.0–3.0)
Basophils Absolute: 0 10*3/uL (ref 0.0–0.1)
EOS ABS: 0.2 10*3/uL (ref 0.0–0.7)
EOS PCT: 3.2 % (ref 0.0–5.0)
HEMATOCRIT: 39.8 % (ref 36.0–46.0)
HEMOGLOBIN: 12.6 g/dL (ref 12.0–15.0)
LYMPHS PCT: 19.3 % (ref 12.0–46.0)
Lymphs Abs: 1.3 10*3/uL (ref 0.7–4.0)
MCHC: 31.8 g/dL (ref 30.0–36.0)
MCV: 83.6 fl (ref 78.0–100.0)
Monocytes Absolute: 0.7 10*3/uL (ref 0.1–1.0)
Monocytes Relative: 9.7 % (ref 3.0–12.0)
NEUTROS ABS: 4.6 10*3/uL (ref 1.4–7.7)
Neutrophils Relative %: 67.3 % (ref 43.0–77.0)
PLATELETS: 279 10*3/uL (ref 150.0–400.0)
RBC: 4.76 Mil/uL (ref 3.87–5.11)
RDW: 19.3 % — AB (ref 11.5–15.5)
WBC: 6.8 10*3/uL (ref 4.0–10.5)

## 2015-02-17 LAB — BASIC METABOLIC PANEL
BUN: 19 mg/dL (ref 6–23)
CHLORIDE: 109 meq/L (ref 96–112)
CO2: 28 meq/L (ref 19–32)
CREATININE: 1.57 mg/dL — AB (ref 0.40–1.20)
Calcium: 8.9 mg/dL (ref 8.4–10.5)
GFR: 40.42 mL/min — ABNORMAL LOW (ref >60.00)
Glucose, Bld: 79 mg/dL (ref 70–99)
POTASSIUM: 4.1 meq/L (ref 3.5–5.1)
Sodium: 146 mEq/L — ABNORMAL HIGH (ref 135–145)

## 2015-02-17 LAB — POCT GLYCOSYLATED HEMOGLOBIN (HGB A1C): HEMOGLOBIN A1C: 6.3

## 2015-02-17 MED ORDER — INSULIN NPH ISOPHANE & REGULAR (70-30) 100 UNIT/ML ~~LOC~~ SUSP: 18.0000 [IU] | Freq: Every day | SUBCUTANEOUS | Status: DC

## 2015-02-17 NOTE — Patient Instructions (Addendum)
Please continue the same 18 units in the morning, but please stop taking it in the evening.   Please drink plenty of fluids.  For now, you can have drinks with sugar.   check your blood sugar twice a day.  vary the time of day when you check, between before the 3 meals, and at bedtime.  also check if you have symptoms of your blood sugar being too high or too low.  please keep a record of the readings and bring it to your next appointment here.  You can write it on any piece of paper.  please call us sooner if your blood sugar goes below 70, or if you have a lot of readings over 200.   Please stop taking the furosemide and potassium until the diarrhea is gone, then resume.   blood tests are requested for you today.  We'll let you know about the results.   I hope you feel better soon.  If you don't feel better by next week, please call back.  Please call sooner if you get worse.   Please come back for a follow-up appointment in 2 months.

## 2015-02-17 NOTE — Progress Notes (Signed)
Subjective:    Patient ID: [name], female    DOB: 11-Feb-1930, 81 y.o.   MRN: [id number]  HPI Pt returns for f/u of diabetes mellitus: DM type: Insulin-requiring type 2 Dx'ed: 4562 Complications: polyneuropathy, nephropathy, CAD, and PAD.  Therapy: insulin since 1991 GDM: never DKA: never Severe hypoglycemia: last episode was in 2014 Pancreatitis: never Other: therapy is limited by pt's request for least expensive insulin, and by her need for a simple regimen; husband administers insulin to her. Interval history: I asked husband, who says pt takes 18 units qam and 10 units qpm (until he reduced by 1/2 since he called yesterday).  no cbg record, but pt says she has been having mild hypoglycemia at night.   Pt states 5 days of slight cramps in the abdomen, and assoc diarrhea.  No recent abx Past Medical History  Diagnosis Date  . Uterine cancer   . Diabetes mellitus     type II; peripheral neuropathy  . Hypertension   . GERD (gastroesophageal reflux disease)   . Peripheral vascular disease     s/p L BKA 08/2012  . Obesity   . Dyslipidemia   . Diastolic CHF, chronic     EF 50-55%, mild LVH and grade 1 diast. Dysfxn  . Osteoarthritis cervical spine   . DM retinopathy   . Sleep apnea     with CPAP  . History of shingles   . COPD (chronic obstructive pulmonary disease)   . Depression   . Peptic ulcer disease     duodenal  . Peptic stricture of esophagus   . Hiatal hernia   . Diverticulosis   . Arthritis   . ASCVD (arteriosclerotic cardiovascular disease)     on MRI brin  . CAD (coronary artery disease)     a. s/p multiple caths with nonobs CAD;   b. cath 1/10: pLAD 20%, mLAD 40%, pCFX 20%, mCFX 40%, pRCA 60-70%;   c.  Myoview 06/02/12: Low anterior wall scar, no ischemia, EF 37%. d. cath 06/20/2014 70% mid LCx, medical therapy, high risk for PCI due to close proximity to large OM   . Cardiomyopathy     a. Echo 05/31/12: Severe LVH, EF 40-45%, diffuse HK, grade 1  diastolic dysfunction, mild to moderate calcified aortic valve annulus, mild to moderate aortic stenosis, MAC, mild MR, moderate LAE  . Asthma     Mild  . Pruritic condition     Idiopathic  . Hyperlipidemia   . Zoster   . Noncompliance   . CKD (chronic kidney disease)     Dr. Lorrene Reid    Past Surgical History  Procedure Laterality Date  . Tubal ligation  1967  . Knee arthroscopy  10/1998    Left  . Craniotomy  1997    Left for SDH  . Cataract extraction, bilateral  2005  . Hernia repair    . Esophagogastroduodenoscopy  04/04/2004  . Spine surgery      C-spine and lumbar surgery  . Cholecystectomy  2010  . Cardiac catheterization    . Dexa  7/05  . Amputation Left 09/04/2012    Procedure: AMPUTATION BELOW KNEE;  Surgeon: Angelia Mould, MD;  Location: [facility];  Service: Vascular;  Laterality: Left;  . Abdominal angiogram N/A 08/31/2012    Procedure: ABDOMINAL ANGIOGRAM with runoff poss intervention;  Surgeon: Angelia Mould, MD;  Location: [facility] CATH LAB;  Service: Cardiovascular;  Laterality: N/A;  . Tubal ligation    . Left heart  catheterization with coronary angiogram N/A 06/22/2014    Procedure: LEFT HEART CATHETERIZATION WITH CORONARY ANGIOGRAM;  Surgeon: Burnell Blanks, MD;  Location: [facility] CATH LAB;  Service: Cardiovascular;  Laterality: N/A;  . Eye surgery    . Brain surgery      1997 blood clot on the brain, then had to relieve fluid on the brain  . Multiple extractions with alveoloplasty N/A 08/01/2014    Procedure: EXTRACTIONS OF TEETH NUMBERS 7 8 9 10 11  AND 19 AND ALVEOLOPLASTY UPPER LEFT AND RIGHT QUADRANT;  Surgeon: Isac Caddy, DDS;  Location: [facility];  Service: Oral Surgery;  Laterality: N/A;    Social History   Social History  . Marital Status: Married    Spouse Name: N/A  . Number of Children: 7  . Years of Education: N/A   Occupational History  . Retired    Social History Main Topics  . Smoking status: Never Smoker   . Smokeless  tobacco: Never Used  . Alcohol Use: No     Comment: rare  . Drug Use: No  . Sexual Activity: No   Other Topics Concern  . Not on file   Social History Narrative    Current Outpatient Prescriptions on File Prior to Visit  Medication Sig Dispense Refill  . acetaminophen (TYLENOL) 500 MG tablet Take 500 mg by mouth every 6 (six) hours as needed for pain. For pain    . albuterol (PROVENTIL HFA;VENTOLIN HFA) 108 (90 BASE) MCG/ACT inhaler Inhale 2 puffs into the lungs every 4 (four) hours as needed for wheezing or shortness of breath. 1 Inhaler 3  . albuterol (PROVENTIL) (2.5 MG/3ML) 0.083% nebulizer solution Take 3 mLs (2.5 mg total) by nebulization every 4 (four) hours as needed for wheezing or shortness of breath. 75 mL 12  . allopurinol (ZYLOPRIM) 100 MG tablet TAKE 1 TABLET BY MOUTH DAILY 30 tablet 0  . amLODipine (NORVASC) 10 MG tablet TAKE 1 TABLET BY MOUTH DAILY 30 tablet 0  . aspirin 81 MG chewable tablet Chew 1 tablet (81 mg total) by mouth daily. (Patient taking differently: Chew 325 mg by mouth daily. )    . atorvastatin (LIPITOR) 40 MG tablet TAKE 1 TABLET BY MOUTH DAILY AT 6 PM 30 tablet 0  . calcitRIOL (ROCALTROL) 0.25 MCG capsule TAKE ONE CAPSULE BY MOUTH EVERY DAY 30 capsule 0  . clopidogrel (PLAVIX) 75 MG tablet Take 1 tablet (75 mg total) by mouth daily. 30 tablet 11  . docusate sodium (COLACE) 100 MG capsule Take 100 mg by mouth 2 (two) times daily as needed for mild constipation (constipation).     Marland Kitchen donepezil (ARICEPT) 5 MG tablet Take 1 tablet (5 mg total) by mouth at bedtime. 30 tablet 11  . feeding supplement (GLUCERNA SHAKE) LIQD Take 237 mLs by mouth 2 (two) times daily.    . Fluticasone-Salmeterol (ADVAIR DISKUS) 100-50 MCG/DOSE AEPB Inhale 1 puff into the lungs 2 (two) times daily. 1 each 11  . furosemide (LASIX) 80 MG tablet Take 0.5 tablets (40 mg total) by mouth daily. 90 tablet 3  . gabapentin (NEURONTIN) 300 MG capsule TAKE 1 CAPSULE BY MOUTH AT BEDTIME 30  capsule 0  . hydrALAZINE (APRESOLINE) 25 MG tablet TAKE 1 TABLET BY MOUTH THREE TIMES DAILY 90 tablet 0  . HYDROcodone-acetaminophen (NORCO) 10-325 MG per tablet Take one tablet by mouth every 4 hours as needed for pain 180 tablet 0  . isosorbide mononitrate (IMDUR) 60 MG 24 hr tablet TAKE 1 TABLET  BY MOUTH DAILY 30 tablet 5  . latanoprost (XALATAN) 0.005 % ophthalmic solution Place 1 drop into both eyes at bedtime.    . nebivolol (BYSTOLIC) 2.5 MG tablet Take 1 tablet (2.5 mg total) by mouth daily. 30 tablet 5  . nitroGLYCERIN (NITROSTAT) 0.4 MG SL tablet Place 0.4 mg under the tongue every 5 (five) minutes as needed for chest pain.     . ONE TOUCH ULTRA TEST test strip USE TO TEST BLOOD SUGAR TWICE DAILY. 100 each 3  . pantoprazole (PROTONIX) 40 MG tablet TAKE 1 TABLET BY MOUTH TWICE DAILY 60 tablet 0  . potassium chloride SA (K-DUR,KLOR-CON) 20 MEQ tablet Take 1 tablet (20 mEq total) by mouth daily. 90 tablet 3  . sertraline (ZOLOFT) 100 MG tablet TAKE 1 TABLET BY MOUTH EVERY DAY 30 tablet 0  . ZETIA 10 MG tablet TAKE 1 TABLET BY MOUTH EVERY MORNING 30 tablet 0   No current facility-administered medications on file prior to visit.    Allergies  Allergen Reactions  . Iohexol      Code: RASH, Desc: [place] ON PT'S CHART ALLERGIC TO IV DYE 09/04/07/RM, Onset Date: [id number]     Family History  Problem Relation Age of Onset  . Cancer - Other Mother     "Stomach" Cancer  . Diabetes Mother   . Heart disease Mother   . Stomach cancer Mother   . Hypertension Mother   . Lymphoma Father   . Hypertension Father   . Kidney disease Paternal Grandmother   . Asthma Other   . Diabetes Sister   . Rheum arthritis Mother   . Rheum arthritis Father     BP 139/62 mmHg  Pulse 62  Temp(Src) 98.1 F (36.7 C) (Oral)  Ht 5\' 6"  (1.676 m)  Wt 203 lb (92.08 kg)  BMI 32.78 kg/m2  SpO2 92%  Review of Systems Denies n/v/fever.    Objective:   Physical Exam VITAL SIGNS:  See vs  page GENERAL: no distress.  In wheelchair.   ABDOMEN: abdomen is soft, nontender.  no hepatosplenomegaly.  not distended.  no hernia LLE: BKA.   Right foot: no deformity.  There is bilateral onychomycosis.  Skin: no ulcer, and normal color and temp, on the right foot.  Neuro: sensation is intact to touch on the right foot.   CV: 1+ right leg edema.  A1c=6.5% Lab Results  Component Value Date   CREATININE 1.57* 02/17/2015   BUN 19 02/17/2015   NA 146* 02/17/2015   K 4.1 02/17/2015   CL 109 02/17/2015   CO2 28 02/17/2015      Assessment & Plan:  DM: overcontrolled, given this regimen, which does match insulin to her changing needs throughout the day Diarrhea, new, uncertain etiology Renal failure: stable  Patient is advised the following: Patient Instructions  Please continue the same 18 units in the morning, but please stop taking it in the evening.   Please drink plenty of fluids.  For now, you can have drinks with sugar.   check your blood sugar twice a day.  vary the time of day when you check, between before the 3 meals, and at bedtime.  also check if you have symptoms of your blood sugar being too high or too low.  please keep a record of the readings and bring it to your next appointment here.  You can write it on any piece of paper.  please call us sooner if your blood sugar goes below 70,  or if you have a lot of readings over 200.   Please stop taking the furosemide and potassium until the diarrhea is gone, then resume.   blood tests are requested for you today.  We'll let you know about the results.   I hope you feel better soon.  If you don't feel better by next week, please call back.  Please call sooner if you get worse.   Please come back for a follow-up appointment in 2 months.

## 2015-02-21 ENCOUNTER — Telehealth: Payer: Self-pay | Admitting: Endocrinology

## 2015-02-21 ENCOUNTER — Ambulatory Visit: Payer: Medicare Other | Admitting: Endocrinology

## 2015-02-21 NOTE — Telephone Encounter (Signed)
No follow up necessary.  

## 2015-02-21 NOTE — Telephone Encounter (Signed)
Patient no showed today's appt. Please advise on how to follow up. °A. No follow up necessary. °B. Follow up urgent. Contact patient immediately. °C. Follow up necessary. Contact patient and schedule visit in ___ days. °D. Follow up advised. Contact patient and schedule visit in ____weeks. ° °

## 2015-02-22 NOTE — Telephone Encounter (Signed)
See note below to be advise, Thanks! 

## 2015-02-22 NOTE — Telephone Encounter (Signed)
[name] calling to let you know that she is no longer having diarrhea.

## 2015-03-02 ENCOUNTER — Other Ambulatory Visit: Payer: Self-pay | Admitting: Endocrinology

## 2015-03-06 ENCOUNTER — Emergency Department (HOSPITAL_COMMUNITY): Payer: Medicare Other

## 2015-03-06 ENCOUNTER — Inpatient Hospital Stay (HOSPITAL_COMMUNITY)
Admission: EM | Admit: 2015-03-06 | Discharge: 2015-03-09 | DRG: 286 | Disposition: A | Discharge status: Home / Self Care | Payer: Medicare Other | Attending: Cardiovascular Disease | Admitting: Cardiovascular Disease

## 2015-03-06 ENCOUNTER — Encounter (HOSPITAL_COMMUNITY): Payer: Self-pay

## 2015-03-06 DIAGNOSIS — I214 Non-ST elevation (NSTEMI) myocardial infarction: Secondary | ICD-10-CM

## 2015-03-06 DIAGNOSIS — Z79899 Other long term (current) drug therapy: Secondary | ICD-10-CM | POA: Diagnosis not present

## 2015-03-06 DIAGNOSIS — I10 Essential (primary) hypertension: Secondary | ICD-10-CM | POA: Diagnosis not present

## 2015-03-06 DIAGNOSIS — Z91041 Radiographic dye allergy status: Secondary | ICD-10-CM | POA: Diagnosis not present

## 2015-03-06 DIAGNOSIS — J45909 Unspecified asthma, uncomplicated: Secondary | ICD-10-CM | POA: Diagnosis present

## 2015-03-06 DIAGNOSIS — Z794 Long term (current) use of insulin: Secondary | ICD-10-CM | POA: Diagnosis not present

## 2015-03-06 DIAGNOSIS — I5032 Chronic diastolic (congestive) heart failure: Secondary | ICD-10-CM | POA: Insufficient documentation

## 2015-03-06 DIAGNOSIS — Z6831 Body mass index (BMI) 31.0-31.9, adult: Secondary | ICD-10-CM | POA: Diagnosis not present

## 2015-03-06 DIAGNOSIS — N183 Chronic kidney disease, stage 3 (moderate): Secondary | ICD-10-CM | POA: Diagnosis present

## 2015-03-06 DIAGNOSIS — E11319 Type 2 diabetes mellitus with unspecified diabetic retinopathy without macular edema: Secondary | ICD-10-CM | POA: Diagnosis present

## 2015-03-06 DIAGNOSIS — M1990 Unspecified osteoarthritis, unspecified site: Secondary | ICD-10-CM | POA: Diagnosis present

## 2015-03-06 DIAGNOSIS — R079 Chest pain, unspecified: Secondary | ICD-10-CM

## 2015-03-06 DIAGNOSIS — E1151 Type 2 diabetes mellitus with diabetic peripheral angiopathy without gangrene: Secondary | ICD-10-CM | POA: Diagnosis present

## 2015-03-06 DIAGNOSIS — I25110 Atherosclerotic heart disease of native coronary artery with unstable angina pectoris: Secondary | ICD-10-CM | POA: Diagnosis not present

## 2015-03-06 DIAGNOSIS — Z7902 Long term (current) use of antithrombotics/antiplatelets: Secondary | ICD-10-CM

## 2015-03-06 DIAGNOSIS — G4733 Obstructive sleep apnea (adult) (pediatric): Secondary | ICD-10-CM | POA: Diagnosis present

## 2015-03-06 DIAGNOSIS — I129 Hypertensive chronic kidney disease with stage 1 through stage 4 chronic kidney disease, or unspecified chronic kidney disease: Secondary | ICD-10-CM | POA: Diagnosis present

## 2015-03-06 DIAGNOSIS — E1122 Type 2 diabetes mellitus with diabetic chronic kidney disease: Secondary | ICD-10-CM | POA: Diagnosis present

## 2015-03-06 DIAGNOSIS — E1142 Type 2 diabetes mellitus with diabetic polyneuropathy: Secondary | ICD-10-CM | POA: Diagnosis present

## 2015-03-06 DIAGNOSIS — E669 Obesity, unspecified: Secondary | ICD-10-CM | POA: Diagnosis present

## 2015-03-06 DIAGNOSIS — Z8711 Personal history of peptic ulcer disease: Secondary | ICD-10-CM

## 2015-03-06 DIAGNOSIS — J449 Chronic obstructive pulmonary disease, unspecified: Secondary | ICD-10-CM | POA: Diagnosis present

## 2015-03-06 DIAGNOSIS — I152 Hypertension secondary to endocrine disorders: Secondary | ICD-10-CM | POA: Diagnosis present

## 2015-03-06 DIAGNOSIS — E785 Hyperlipidemia, unspecified: Secondary | ICD-10-CM | POA: Diagnosis present

## 2015-03-06 DIAGNOSIS — I5033 Acute on chronic diastolic (congestive) heart failure: Secondary | ICD-10-CM | POA: Diagnosis not present

## 2015-03-06 DIAGNOSIS — Z9889 Other specified postprocedural states: Secondary | ICD-10-CM

## 2015-03-06 DIAGNOSIS — Z8542 Personal history of malignant neoplasm of other parts of uterus: Secondary | ICD-10-CM

## 2015-03-06 DIAGNOSIS — K219 Gastro-esophageal reflux disease without esophagitis: Secondary | ICD-10-CM | POA: Diagnosis present

## 2015-03-06 DIAGNOSIS — R7989 Other specified abnormal findings of blood chemistry: Secondary | ICD-10-CM | POA: Diagnosis not present

## 2015-03-06 DIAGNOSIS — Z9049 Acquired absence of other specified parts of digestive tract: Secondary | ICD-10-CM | POA: Diagnosis present

## 2015-03-06 DIAGNOSIS — R778 Other specified abnormalities of plasma proteins: Secondary | ICD-10-CM | POA: Diagnosis present

## 2015-03-06 DIAGNOSIS — I429 Cardiomyopathy, unspecified: Secondary | ICD-10-CM | POA: Diagnosis present

## 2015-03-06 DIAGNOSIS — F329 Major depressive disorder, single episode, unspecified: Secondary | ICD-10-CM | POA: Diagnosis present

## 2015-03-06 DIAGNOSIS — E1159 Type 2 diabetes mellitus with other circulatory complications: Secondary | ICD-10-CM | POA: Diagnosis present

## 2015-03-06 DIAGNOSIS — Z9119 Patient's noncompliance with other medical treatment and regimen: Secondary | ICD-10-CM | POA: Diagnosis present

## 2015-03-06 DIAGNOSIS — I2510 Atherosclerotic heart disease of native coronary artery without angina pectoris: Secondary | ICD-10-CM | POA: Diagnosis not present

## 2015-03-06 DIAGNOSIS — E1165 Type 2 diabetes mellitus with hyperglycemia: Secondary | ICD-10-CM | POA: Diagnosis present

## 2015-03-06 DIAGNOSIS — L299 Pruritus, unspecified: Secondary | ICD-10-CM | POA: Diagnosis present

## 2015-03-06 DIAGNOSIS — Z89512 Acquired absence of left leg below knee: Secondary | ICD-10-CM

## 2015-03-06 DIAGNOSIS — Z7982 Long term (current) use of aspirin: Secondary | ICD-10-CM | POA: Diagnosis not present

## 2015-03-06 DIAGNOSIS — R2681 Unsteadiness on feet: Secondary | ICD-10-CM | POA: Diagnosis present

## 2015-03-06 LAB — BASIC METABOLIC PANEL
ANION GAP: 10 (ref 5–15)
BUN: 11 mg/dL (ref 6–20)
CHLORIDE: 108 mmol/L (ref 101–111)
CO2: 25 mmol/L (ref 22–32)
Calcium: 9.1 mg/dL (ref 8.9–10.3)
Creatinine, Ser: 1.57 mg/dL — ABNORMAL HIGH (ref 0.44–1.00)
GFR calc non Af Amer: 29 mL/min — ABNORMAL LOW (ref >60)
GFR, EST AFRICAN AMERICAN: 34 mL/min — AB (ref >60)
GLUCOSE: 168 mg/dL — AB (ref 65–99)
POTASSIUM: 4.2 mmol/L (ref 3.5–5.1)
Sodium: 143 mmol/L (ref 135–145)

## 2015-03-06 LAB — I-STAT TROPONIN, ED: Troponin i, poc: 0.13 ng/mL (ref 0.00–0.08)

## 2015-03-06 LAB — TROPONIN I: Troponin I: 0.19 ng/mL — ABNORMAL HIGH (ref <0.031)

## 2015-03-06 LAB — CBC
HEMATOCRIT: 39.8 % (ref 36.0–46.0)
HEMOGLOBIN: 12.6 g/dL (ref 12.0–15.0)
MCH: 27.3 pg (ref 26.0–34.0)
MCHC: 31.7 g/dL (ref 30.0–36.0)
MCV: 86.3 fL (ref 78.0–100.0)
Platelets: 296 10*3/uL (ref 150–400)
RBC: 4.61 MIL/uL (ref 3.87–5.11)
RDW: 18.2 % — ABNORMAL HIGH (ref 11.5–15.5)
WBC: 6.5 10*3/uL (ref 4.0–10.5)

## 2015-03-06 LAB — GLUCOSE, CAPILLARY
Glucose-Capillary: 217 mg/dL — ABNORMAL HIGH (ref 65–99)
Glucose-Capillary: 204 mg/dL — ABNORMAL HIGH (ref 65–99)

## 2015-03-06 LAB — HEPARIN LEVEL (UNFRACTIONATED): HEPARIN UNFRACTIONATED: 0.86 [IU]/mL — AB (ref 0.30–0.70)

## 2015-03-06 MED ORDER — NITROGLYCERIN IN D5W 200-5 MCG/ML-% IV SOLN: 0.0000 ug/min | Freq: Once | INTRAVENOUS | Status: DC

## 2015-03-06 MED ORDER — DOCUSATE SODIUM 100 MG PO CAPS: 100.0000 mg | ORAL_CAPSULE | Freq: Two times a day (BID) | ORAL | Status: DC | PRN

## 2015-03-06 MED ORDER — SERTRALINE HCL 100 MG PO TABS
100.0000 mg | ORAL_TABLET | Freq: Every day | ORAL | Status: DC
  Administered 2015-03-06 – 2015-03-09 (×4): 100 mg via ORAL
  Filled 2015-03-06 (×4): qty 1

## 2015-03-06 MED ORDER — HEPARIN SODIUM (PORCINE) 5000 UNIT/ML IJ SOLN: 60.0000 [IU]/kg | Freq: Once | INTRAMUSCULAR | Status: DC

## 2015-03-06 MED ORDER — SODIUM CHLORIDE 0.9 % IJ SOLN
3.0000 mL | Freq: Two times a day (BID) | INTRAMUSCULAR | Status: DC
  Administered 2015-03-07: 3 mL via INTRAVENOUS

## 2015-03-06 MED ORDER — MOMETASONE FURO-FORMOTEROL FUM 100-5 MCG/ACT IN AERO
2.0000 | INHALATION_SPRAY | Freq: Two times a day (BID) | RESPIRATORY_TRACT | Status: DC
  Administered 2015-03-06 – 2015-03-09 (×3): 2 via RESPIRATORY_TRACT
  Filled 2015-03-06 (×2): qty 8.8

## 2015-03-06 MED ORDER — HEPARIN (PORCINE) IN NACL 100-0.45 UNIT/ML-% IJ SOLN
1150.0000 [IU]/h | INTRAMUSCULAR | Status: DC
  Administered 2015-03-06: 1300 [IU]/h via INTRAVENOUS
  Filled 2015-03-06 (×3): qty 250

## 2015-03-06 MED ORDER — ALLOPURINOL 100 MG PO TABS
100.0000 mg | ORAL_TABLET | Freq: Every day | ORAL | Status: DC
  Administered 2015-03-06 – 2015-03-09 (×4): 100 mg via ORAL
  Filled 2015-03-06 (×4): qty 1

## 2015-03-06 MED ORDER — NITROGLYCERIN 0.4 MG SL SUBL
0.4000 mg | SUBLINGUAL_TABLET | Freq: Once | SUBLINGUAL | Status: AC
  Administered 2015-03-06: 0.4 mg via SUBLINGUAL
  Filled 2015-03-06: qty 1

## 2015-03-06 MED ORDER — AMLODIPINE BESYLATE 10 MG PO TABS
10.0000 mg | ORAL_TABLET | Freq: Every day | ORAL | Status: DC
  Administered 2015-03-06 – 2015-03-09 (×4): 10 mg via ORAL
  Filled 2015-03-06 (×4): qty 1

## 2015-03-06 MED ORDER — CLOPIDOGREL BISULFATE 75 MG PO TABS
75.0000 mg | ORAL_TABLET | Freq: Every day | ORAL | Status: DC
  Administered 2015-03-06 – 2015-03-09 (×4): 75 mg via ORAL
  Filled 2015-03-06 (×4): qty 1

## 2015-03-06 MED ORDER — ATORVASTATIN CALCIUM 40 MG PO TABS
40.0000 mg | ORAL_TABLET | Freq: Every day | ORAL | Status: DC
  Administered 2015-03-07 – 2015-03-08 (×2): 40 mg via ORAL
  Filled 2015-03-06 (×3): qty 1

## 2015-03-06 MED ORDER — PANTOPRAZOLE SODIUM 40 MG PO TBEC
40.0000 mg | DELAYED_RELEASE_TABLET | Freq: Two times a day (BID) | ORAL | Status: DC
  Administered 2015-03-06 – 2015-03-09 (×6): 40 mg via ORAL
  Filled 2015-03-06 (×5): qty 1

## 2015-03-06 MED ORDER — INSULIN ASPART PROT & ASPART (70-30 MIX) 100 UNIT/ML ~~LOC~~ SUSP
18.0000 [IU] | Freq: Every day | SUBCUTANEOUS | Status: DC
  Administered 2015-03-07: 9 [IU] via SUBCUTANEOUS
  Administered 2015-03-08 – 2015-03-09 (×2): 18 [IU] via SUBCUTANEOUS
  Filled 2015-03-06: qty 10

## 2015-03-06 MED ORDER — ACETAMINOPHEN 325 MG PO TABS: 650.0000 mg | ORAL_TABLET | ORAL | Status: DC | PRN

## 2015-03-06 MED ORDER — EZETIMIBE 10 MG PO TABS
10.0000 mg | ORAL_TABLET | Freq: Every morning | ORAL | Status: DC
  Administered 2015-03-07 – 2015-03-09 (×3): 10 mg via ORAL
  Filled 2015-03-06 (×4): qty 1

## 2015-03-06 MED ORDER — GABAPENTIN 300 MG PO CAPS
300.0000 mg | ORAL_CAPSULE | Freq: Every day | ORAL | Status: DC
  Administered 2015-03-06 – 2015-03-08 (×3): 300 mg via ORAL
  Filled 2015-03-06 (×3): qty 1

## 2015-03-06 MED ORDER — SODIUM CHLORIDE 0.9 % WEIGHT BASED INFUSION
1.0000 mL/kg/h | INTRAVENOUS | Status: DC
  Administered 2015-03-06: 1 mL/kg/h via INTRAVENOUS

## 2015-03-06 MED ORDER — HEPARIN BOLUS VIA INFUSION
4000.0000 [IU] | Freq: Once | INTRAVENOUS | Status: AC
  Administered 2015-03-06: 4000 [IU] via INTRAVENOUS
  Filled 2015-03-06: qty 4000

## 2015-03-06 MED ORDER — ONDANSETRON HCL 4 MG/2ML IJ SOLN: 4.0000 mg | Freq: Four times a day (QID) | INTRAMUSCULAR | Status: DC | PRN

## 2015-03-06 MED ORDER — LATANOPROST 0.005 % OP SOLN
1.0000 [drp] | Freq: Every day | OPHTHALMIC | Status: DC
  Administered 2015-03-08: 1 [drp] via OPHTHALMIC
  Filled 2015-03-06: qty 2.5

## 2015-03-06 MED ORDER — DONEPEZIL HCL 5 MG PO TABS
5.0000 mg | ORAL_TABLET | Freq: Every day | ORAL | Status: DC
  Administered 2015-03-06 – 2015-03-08 (×3): 5 mg via ORAL
  Filled 2015-03-06 (×3): qty 1

## 2015-03-06 MED ORDER — CALCITRIOL 0.25 MCG PO CAPS
0.2500 ug | ORAL_CAPSULE | Freq: Every day | ORAL | Status: DC
  Administered 2015-03-06 – 2015-03-09 (×4): 0.25 ug via ORAL
  Filled 2015-03-06 (×4): qty 1

## 2015-03-06 MED ORDER — SODIUM CHLORIDE 0.9 % IV SOLN: 250.0000 mL | INTRAVENOUS | Status: DC | PRN

## 2015-03-06 MED ORDER — ALBUTEROL SULFATE HFA 108 (90 BASE) MCG/ACT IN AERS: 2.0000 | INHALATION_SPRAY | RESPIRATORY_TRACT | Status: DC | PRN

## 2015-03-06 MED ORDER — ASPIRIN 81 MG PO CHEW
81.0000 mg | CHEWABLE_TABLET | Freq: Every day | ORAL | Status: DC
  Administered 2015-03-07 – 2015-03-09 (×3): 81 mg via ORAL
  Filled 2015-03-06 (×4): qty 1

## 2015-03-06 MED ORDER — SODIUM CHLORIDE 0.9 % IJ SOLN: 3.0000 mL | INTRAMUSCULAR | Status: DC | PRN

## 2015-03-06 MED ORDER — HYDRALAZINE HCL 25 MG PO TABS
25.0000 mg | ORAL_TABLET | Freq: Three times a day (TID) | ORAL | Status: DC
  Administered 2015-03-06 – 2015-03-07 (×2): 25 mg via ORAL
  Filled 2015-03-06 (×2): qty 1

## 2015-03-06 MED ORDER — NITROGLYCERIN 0.4 MG SL SUBL: 0.4000 mg | SUBLINGUAL_TABLET | SUBLINGUAL | Status: DC | PRN

## 2015-03-06 MED ORDER — ALBUTEROL SULFATE (2.5 MG/3ML) 0.083% IN NEBU: 2.5000 mg | INHALATION_SOLUTION | RESPIRATORY_TRACT | Status: DC | PRN

## 2015-03-06 MED ORDER — NITROGLYCERIN IN D5W 200-5 MCG/ML-% IV SOLN
0.0000 ug/min | Freq: Once | INTRAVENOUS | Status: AC
  Administered 2015-03-06: 5 ug/min via INTRAVENOUS
  Filled 2015-03-06: qty 250

## 2015-03-06 NOTE — Progress Notes (Signed)
Patient arrived on floor around 1720; patient alert & oriented with c/o's at this time; denies pain; nitro drip @ 71mcg; heparin drip @ 13 cc; O2 @ 3L; no signs of acute distress; bed in lowest position with call bell in reach; gave report to on coming RN

## 2015-03-06 NOTE — H&P (Signed)
Patient ID: [name] MRN: [id number], DOB/AGE: Sep 08, 1929   Admit date: 03/06/2015   Primary Physician: Renato Shin, MD Primary Cardiologist: Dr. Meda Coffee    [name]:[id number] [name] is a 81 y.o. female with past medical history of diastolic heart failure, nonobstructive CAD, Type II DM, HTN, HLD, OSA (on CPAP), PVD, and CKD who presented to [facility] ED today via EMS for chest pain.   The patient reports having "burning" left pectoral region chest pain that started last night and was mild, keeping her from sleeping well overnight. This morning, around 9:00AM, the pain increased in intensity to an 8/10 while she was still lying in bed. She reports having associated shortness of breath, left arm pain, and right leg pain which have been present since this morning as well. She denies any associated nausea or vomiting. Since arriving to the ED and being started on Heparin and NTG drip, she reports her pain as now being a 5/10  She denies having any recent chest pain in the past several months. She does report one episode of diaphoresis and left arm pain that occurred 2 weeks ago but resolved after a few hours and has not reoccurred until now.  She also reports recent dizziness and lightheadedness that occurs after taking her morning medications and 2:00PM dose of Hydralazine. She reports almost having a fall secondary to the dizziness but denies any actual falls or syncopal events.  Her last echocardiogram was in 06/2014, showing an EF of 50% to 55% with no regional wall motion abnormalities. Grade 3 diastolic dysfunction was noted along with moderately calcified annulus of mitral valve and left atrium was mildly dilated.   She was last hospitalized for NSTEMI in 06/2014 and had a Myoview initially which was of intermediate risk. She underwent left heart catheterization by Dr. Angelena Form on 06/22/2014 which showed 70% stenosis in distal AV circumflex groove after takeoff of OM1, 20%  stenosis proximal LAD, 30% mid LAD, diffuse 50% proximal to mid RCA stenosis. Medical management was recommended of her distal Circumflex stenosis. The lesion was noted just to be past the takeoff of the large caliber OM branch and PCI would potentially compromise flow into the larger OM branch. It was recommended at that time if she continues to have angina despite optimal medical therapy, one could consider PCI of the distal AV groove Circumflex stenosis.     Problem List  Past Medical History  Diagnosis Date  . Uterine cancer   . Diabetes mellitus     type II; peripheral neuropathy  . Hypertension   . GERD (gastroesophageal reflux disease)   . Peripheral vascular disease     s/p L BKA 08/2012  . Obesity   . Dyslipidemia   . Diastolic CHF, chronic     a. EF 50-55%, mild LVH and grade 1 diast. Dysfxn b. Grade 3 Diastolic Dysfunction 04/5725  . Osteoarthritis cervical spine   . DM retinopathy   . Sleep apnea     with CPAP  . History of shingles   . COPD (chronic obstructive pulmonary disease)   . Depression   . Peptic ulcer disease     duodenal  . Peptic stricture of esophagus   . Hiatal hernia   . Diverticulosis   . Arthritis   . ASCVD (arteriosclerotic cardiovascular disease)     on MRI brin  . CAD (coronary artery disease)     a. s/p multiple caths with nonobs CAD;   b. cath 1/10: pLAD 20%,  mLAD 40%, pCFX 20%, mCFX 40%, pRCA 60-70%;   Christina.  Myoview 06/02/12: Low anterior wall scar, no ischemia, EF 37%. d. cath 06/20/2014 70% mid LCx, medical therapy, high risk for PCI due to close proximity to large OM   . Cardiomyopathy     a. Echo 05/31/12: Severe LVH, EF 40-45%, diffuse HK, grade 1 diastolic dysfunction, mild to moderate calcified aortic valve annulus, mild to moderate aortic stenosis, MAC, mild MR, moderate LAE  . Asthma     Mild  . Pruritic condition     Idiopathic  . Hyperlipidemia   . Zoster   . Noncompliance   . CKD (chronic kidney disease)     Dr. Lorrene Reid      Past Surgical History  Procedure Laterality Date  . Tubal ligation  1967  . Knee arthroscopy  10/1998    Left  . Craniotomy  1997    Left for SDH  . Cataract extraction, bilateral  2005  . Hernia repair    . Esophagogastroduodenoscopy  04/04/2004  . Spine surgery      Christina-spine and lumbar surgery  . Cholecystectomy  2010  . Cardiac catheterization    . Dexa  7/05  . Amputation Left 09/04/2012    Procedure: AMPUTATION BELOW KNEE;  Surgeon: Angelia Mould, MD;  Location: [place];  Service: Vascular;  Laterality: Left;  . Abdominal angiogram N/A 08/31/2012    Procedure: ABDOMINAL ANGIOGRAM with runoff poss intervention;  Surgeon: Angelia Mould, MD;  Location: [facility] CATH LAB;  Service: Cardiovascular;  Laterality: N/A;  . Tubal ligation    . Left heart catheterization with coronary angiogram N/A 06/22/2014    Procedure: LEFT HEART CATHETERIZATION WITH CORONARY ANGIOGRAM;  Surgeon: Burnell Blanks, MD;  Location: [facility] CATH LAB;  Service: Cardiovascular;  Laterality: N/A;  . Eye surgery    . Brain surgery      1997 blood clot on the brain, then had to relieve fluid on the brain  . Multiple extractions with alveoloplasty N/A 08/01/2014    Procedure: EXTRACTIONS OF TEETH NUMBERS 7 8 9 10 11  AND 19 AND ALVEOLOPLASTY UPPER LEFT AND RIGHT QUADRANT;  Surgeon: Isac Caddy, DDS;  Location: [place];  Service: Oral Surgery;  Laterality: N/A;      Home Medications  Prior to Admission medications   Medication Sig Start Date End Date Taking? Authorizing Provider  acetaminophen (TYLENOL) 500 MG tablet Take 500 mg by mouth every 6 (six) hours as needed for pain. For pain   Yes Historical Provider, MD  albuterol (PROVENTIL HFA;VENTOLIN HFA) 108 (90 BASE) MCG/ACT inhaler Inhale 2 puffs into the lungs every 4 (four) hours as needed for wheezing or shortness of breath. 08/05/13  Yes Dorothy Spark, MD  albuterol (PROVENTIL) (2.5 MG/3ML) 0.083% nebulizer solution Take 3 mLs (2.5 mg  total) by nebulization every 4 (four) hours as needed for wheezing or shortness of breath. 12/08/13  Yes Robbie Lis, MD  allopurinol (ZYLOPRIM) 100 MG tablet TAKE 1 TABLET BY MOUTH DAILY 07/25/14  Yes Renato Shin, MD  amLODipine (NORVASC) 10 MG tablet TAKE 1 TABLET BY MOUTH DAILY 03/02/15  Yes Renato Shin, MD  aspirin 81 MG chewable tablet Chew 1 tablet (81 mg total) by mouth daily. Patient taking differently: Chew 325 mg by mouth daily.  06/27/14  Yes Almyra Deforest, PA  atorvastatin (LIPITOR) 40 MG tablet TAKE 1 TABLET BY MOUTH DAILY AT 6 PM 03/02/15  Yes Renato Shin, MD  calcitRIOL (ROCALTROL) 0.25 MCG  capsule TAKE ONE CAPSULE BY MOUTH EVERY DAY 04/29/14  Yes Renato Shin, MD  clopidogrel (PLAVIX) 75 MG tablet Take 1 tablet (75 mg total) by mouth daily. 06/27/14  Yes Almyra Deforest, PA  donepezil (ARICEPT) 5 MG tablet Take 1 tablet (5 mg total) by mouth at bedtime. 05/19/14  Yes Renato Shin, MD  feeding supplement (GLUCERNA SHAKE) LIQD Take 237 mLs by mouth every morning.    Yes Historical Provider, MD  Fluticasone-Salmeterol (ADVAIR DISKUS) 100-50 MCG/DOSE AEPB Inhale 1 puff into the lungs 2 (two) times daily. 07/12/13  Yes Renato Shin, MD  gabapentin (NEURONTIN) 300 MG capsule TAKE 1 CAPSULE BY MOUTH AT BEDTIME 05/27/14  Yes Renato Shin, MD  hydrALAZINE (APRESOLINE) 25 MG tablet TAKE 1 TABLET BY MOUTH THREE TIMES DAILY 03/02/15  Yes Renato Shin, MD  insulin NPH-regular Human (NOVOLIN 70/30) (70-30) 100 UNIT/ML injection Inject 18 Units into the skin daily with breakfast. Patient taking differently: Inject 18 Units into the skin daily with breakfast. Sliding scale. Only takes in the morning 02/17/15  Yes Renato Shin, MD  isosorbide mononitrate (IMDUR) 60 MG 24 hr tablet TAKE 1 TABLET BY MOUTH DAILY 02/06/15  Yes Dorothy Spark, MD  latanoprost (XALATAN) 0.005 % ophthalmic solution Place 1 drop into both eyes at bedtime.   Yes Historical Provider, MD  nitroGLYCERIN (NITROSTAT) 0.4 MG SL tablet Place 0.4 mg  under the tongue every 5 (five) minutes as needed for chest pain.    Yes Historical Provider, MD  pantoprazole (PROTONIX) 40 MG tablet TAKE 1 TABLET BY MOUTH TWICE DAILY 11/21/14  Yes Renato Shin, MD  sertraline (ZOLOFT) 100 MG tablet TAKE 1 TABLET BY MOUTH EVERY DAY 03/02/15  Yes Renato Shin, MD  ZETIA 10 MG tablet TAKE 1 TABLET BY MOUTH EVERY MORNING 02/15/15  Yes Renato Shin, MD  docusate sodium (COLACE) 100 MG capsule Take 100 mg by mouth 2 (two) times daily as needed for mild constipation (constipation).     Historical Provider, MD  furosemide (LASIX) 80 MG tablet Take 0.5 tablets (40 mg total) by mouth daily. Patient not taking: Reported on 03/06/2015 07/01/14   Burtis Junes, NP  HYDROcodone-acetaminophen [facility]) 10-325 MG per tablet Take one tablet by mouth every 4 hours as needed for pain 01/04/14   Blanchie Serve, MD  nebivolol (BYSTOLIC) 2.5 MG tablet Take 1 tablet (2.5 mg total) by mouth daily. Patient not taking: Reported on 03/06/2015 06/27/14   Almyra Deforest, PA  ONE TOUCH ULTRA TEST test strip USE TO TEST BLOOD SUGAR TWICE DAILY. 11/30/14   Renato Shin, MD  potassium chloride SA (K-DUR,KLOR-CON) 20 MEQ tablet Take 1 tablet (20 mEq total) by mouth daily. Patient not taking: Reported on 03/06/2015 07/01/14   Burtis Junes, NP    Allergies: Allergies  Allergen Reactions  . Iohexol      Code: RASH, Desc: [place] ON PT'S CHART ALLERGIC TO IV DYE 09/04/07/RM, Onset Date: [id number]     Past Medical History: Past Medical History  Diagnosis Date  . Uterine cancer   . Diabetes mellitus     type II; peripheral neuropathy  . Hypertension   . GERD (gastroesophageal reflux disease)   . Peripheral vascular disease     s/p L BKA 08/2012  . Obesity   . Dyslipidemia   . Diastolic CHF, chronic     a. EF 50-55%, mild LVH and grade 1 diast. Dysfxn b. Grade 3 Diastolic Dysfunction 70/3500  . Osteoarthritis cervical spine   . DM retinopathy   .  Sleep apnea     with CPAP  . History of  shingles   . COPD (chronic obstructive pulmonary disease)   . Depression   . Peptic ulcer disease     duodenal  . Peptic stricture of esophagus   . Hiatal hernia   . Diverticulosis   . Arthritis   . ASCVD (arteriosclerotic cardiovascular disease)     on MRI brin  . CAD (coronary artery disease)     a. s/p multiple caths with nonobs CAD;   b. cath 1/10: pLAD 20%, mLAD 40%, pCFX 20%, mCFX 40%, pRCA 60-70%;   Christina.  Myoview 06/02/12: Low anterior wall scar, no ischemia, EF 37%. d. cath 06/20/2014 70% mid LCx, medical therapy, high risk for PCI due to close proximity to large OM   . Cardiomyopathy     a. Echo 05/31/12: Severe LVH, EF 40-45%, diffuse HK, grade 1 diastolic dysfunction, mild to moderate calcified aortic valve annulus, mild to moderate aortic stenosis, MAC, mild MR, moderate LAE  . Asthma     Mild  . Pruritic condition     Idiopathic  . Hyperlipidemia   . Zoster   . Noncompliance   . CKD (chronic kidney disease)     Dr. Lorrene Reid     Surgical History: Past Surgical History  Procedure Laterality Date  . Tubal ligation  1967  . Knee arthroscopy  10/1998    Left  . Craniotomy  1997    Left for SDH  . Cataract extraction, bilateral  2005  . Hernia repair    . Esophagogastroduodenoscopy  04/04/2004  . Spine surgery      Christina-spine and lumbar surgery  . Cholecystectomy  2010  . Cardiac catheterization    . Dexa  7/05  . Amputation Left 09/04/2012    Procedure: AMPUTATION BELOW KNEE;  Surgeon: Angelia Mould, MD;  Location: [place];  Service: Vascular;  Laterality: Left;  . Abdominal angiogram N/A 08/31/2012    Procedure: ABDOMINAL ANGIOGRAM with runoff poss intervention;  Surgeon: Angelia Mould, MD;  Location: [facility] CATH LAB;  Service: Cardiovascular;  Laterality: N/A;  . Tubal ligation    . Left heart catheterization with coronary angiogram N/A 06/22/2014    Procedure: LEFT HEART CATHETERIZATION WITH CORONARY ANGIOGRAM;  Surgeon: Burnell Blanks, MD;  Location:  [facility] CATH LAB;  Service: Cardiovascular;  Laterality: N/A;  . Eye surgery    . Brain surgery      1997 blood clot on the brain, then had to relieve fluid on the brain  . Multiple extractions with alveoloplasty N/A 08/01/2014    Procedure: EXTRACTIONS OF TEETH NUMBERS 7 8 9 10 11  AND 19 AND ALVEOLOPLASTY UPPER LEFT AND RIGHT QUADRANT;  Surgeon: Isac Caddy, DDS;  Location: [place];  Service: Oral Surgery;  Laterality: N/A;     Family History: Family History  Problem Relation Age of Onset  . Cancer - Other Mother     "Stomach" Cancer  . Diabetes Mother   . Heart disease Mother   . Stomach cancer Mother   . Hypertension Mother   . Lymphoma Father   . Hypertension Father   . Kidney disease Paternal Grandmother   . Asthma Other   . Diabetes Sister   . Rheum arthritis Mother   . Rheum arthritis Father     Social History: Social History   Social History  . Marital Status: Married    Spouse Name: N/A  . Number of Children: 7  . Years  of Education: N/A   Occupational History  . Retired    Social History Main Topics  . Smoking status: Never Smoker   . Smokeless tobacco: Never Used  . Alcohol Use: No     Comment: rare  . Drug Use: No  . Sexual Activity: No   Other Topics Concern  . Not on file   Social History Narrative     Review of Systems General:  No chills, fever, night sweats or weight changes.  Cardiovascular:  Positive for chest pain and dyspnea on exertion. Negative for edema, orthopnea, palpitations, paroxysmal nocturnal dyspnea. Dermatological: No rash, lesions/masses Respiratory: No cough. Positive for dyspnea Urologic: No hematuria, dysuria Abdominal:   No nausea, vomiting, diarrhea, bright red blood per rectum, melena, or hematemesis Neurologic:  No visual changes, wkns, changes in mental status. Positive for dizziness. All other systems reviewed and are otherwise negative except as noted above.   Physical Exam: Blood pressure 161/119, pulse 68,  temperature 98 F (36.7 Christina), temperature source Oral, resp. rate 23, height 5\' 6"  (1.676 m), weight 203 lb 0.7 oz (92.1 kg), SpO2 95 %.  General: Well developed, well nourished,female in no acute distress. Head: Normocephalic, atraumatic, sclera non-icteric, no xanthomas, nares are without discharge. Dentition:  Neck: No carotid bruits. JVD not elevated.  Lungs: Respirations regular and unlabored, without wheezes or rales.  Heart: Regular rate and rhythm. No S3 or S4.  No murmur, no rubs, or gallops appreciated. Abdomen: Soft, non-tender, non-distended with normoactive bowel sounds. No hepatomegaly. No rebound/guarding. No obvious abdominal masses. Msk:  Strength and tone appear normal for age. No joint deformities or effusions. Extremities: No clubbing or cyanosis. No edema.  Distal pedal pulses are 2+ on right. Below knee amputation on left. Neuro: Alert and oriented X 3. Moves all extremities spontaneously. No focal deficits noted. Psych:  Responds to questions appropriately with a normal affect. Skin: No rashes or lesions noted   Labs: Lab Results  Component Value Date   WBC 6.5 03/06/2015   HGB 12.6 03/06/2015   HCT 39.8 03/06/2015   MCV 86.3 03/06/2015   PLT 296 03/06/2015     Recent Labs Lab 03/06/15 1202  NA 143  K 4.2  CL 108  CO2 25  BUN 11  CREATININE 1.57*  CALCIUM 9.1  GLUCOSE 168*   Troponin (Point of Care Test)  Recent Labs  03/06/15 1207  TROPIPOC 0.13*   Lab Results  Component Value Date   CHOL 118 08/18/2014   HDL 68.30 08/18/2014   LDLCALC 31 08/18/2014   TRIG 96.0 08/18/2014   B NATRIURETIC PEPTIDE  Date/Time Value Ref Range Status  06/17/2014 07:46 PM 1985.7* 0.0 - 100.0 pg/mL Final    Comment:    Please note change in reference range.  06/17/2014 01:30 PM 1951.5* 0.0 - 100.0 pg/mL Final    Comment:    Please note change in reference range.     ECG: NSR. T-wave inversions in lateral leads. Decreased r-wave progression. No acute changes  from last tracing.  Echo: 06/23/2014 Study Conclusions  - Left ventricle: The cavity size was normal. There was moderate concentric hypertrophy. Systolic function was normal. The estimated ejection fraction was in the range of 50% to 55%. Wall motion was normal; there were no regional wall motion abnormalities. Doppler parameters are consistent with a reversible restrictive pattern, indicative of decreased left ventricular diastolic compliance and/or increased left atrial pressure (grade 3 diastolic dysfunction). - Aortic valve: Cusp separation was reduced. - Mitral valve:  Moderately calcified annulus. - Left atrium: The atrium was mildly dilated.  Radiology/Studies: Dg Chest Port 1 View: 03/06/2015   CLINICAL DATA:  Chest pain, history of chronic congestive heart failure COPD and asthma  EXAM: PORTABLE CHEST - 1 VIEW  COMPARISON:  06/17/2014  FINDINGS: Moderate cardiac enlargement. Moderate vascular congestion and venous hypertension. Peribronchial cuffing with mild interstitial prominence. No consolidation or effusion.  IMPRESSION: Cardiac enlargement with vascular congestion and borderline interstitial prominence suggesting the possibility of mild interstitial pulmonary edema.   Electronically Signed   By: Skipper Cliche M.D.   On: 03/06/2015 12:18    ASSESSMENT AND PLAN  1. Chest Pain - history of CAD with most recent cath in 06/2014 showing 70% stenosis in distal AV circumflex groove after takeoff of OM1, 20% stenosis proximal LAD, 30% mid LAD, diffuse 50% proximal to mid RCA stenosis. Medical management recommended at that time but PCI of circumflex stenosis recommended if she continued to have angina. - POC troponin elevated to 0.13. Will admit and continue to cycle enzymes. - Continue Heparin, ASA, Statin, NTG drip.  - Not on BB due to history of bradycardia and COPD. - Will plan for [facility] tomorrow. The risks and benefits of the procedure were discussed with the  patient and her family. Will hydrate overnight with fluids. NPO after midnight.  2. Chronic Diastolic HF - last echo in 06/2014, showing an EF of 50% to 55% with Grade 3 diastolic dysfunction.   3. HTN - continue CCB and Hydralazine - will need to address possibly switching/titrating Hydralazine to a different alternative due to patient's associated dizziness with taking the medication.  4. HLD - continue statin therapy  5. Type 2 DM - continue home NPH dosing. Will be adjusted morning of cath.  6. OSA - On CPAP at home.  - Will order for here while admitted  7. CKD, Stage 3 - baseline creatinine appears to be ~ 1.5. Did bump to 2.13 following cath in 06/2014. - will hydrate with fluids overnight and avoid nephrotoxic medications.  8. COPD - continue home medications.  Signed, Erma Heritage, PA-Christina 03/06/2015, 2:35 PM Pager: [phone]   I have personally seen and examined this patient with Darlen Round, PA-Christina. I agree with the assessment and plan as outlined above. She has classic unstable angina with elevated troponin. She is known to have moderately severe CAD by cath in January 2016. She has done well with medical management of her CAD but is now presenting with clinical story Christina/w unstable angina. She has been having exertional chest pressure at home for the last two weeks. Will admit to telemetry unit. Will cycle cardiac markers. IV heparin, IV NTG, continue home meds. NPO at midnight for cath tomorrow if renal function is stable. Gentle hydration tonight.   MCALHANY,CHRISTOPHER 03/06/2015 3:23 PM

## 2015-03-06 NOTE — Progress Notes (Signed)
ANTICOAGULATION CONSULT NOTE - Initial Consult  Pharmacy Consult for heparin Indication: chest pain/ACS  Allergies  Allergen Reactions  . Iohexol      Code: RASH, Desc: [place] ON PT'S CHART ALLERGIC TO IV DYE 09/04/07/RM, Onset Date: [id number]     Patient Measurements: Height: 5\' 6"  (167.6 cm) Weight: 203 lb 0.7 oz (92.1 kg) IBW/kg (Calculated) : 59.3 Heparin Dosing Weight: 79.5kg  Vital Signs: Temp: 98 F (36.7 C) (09/19 1135) Temp Source: Oral (09/19 1135) BP: 176/64 mmHg (09/19 1139) Pulse Rate: 68 (09/19 1139)  Labs:  Recent Labs  03/06/15 1202  HGB 12.6  HCT 39.8  PLT 296  CREATININE 1.57*    Estimated Creatinine Clearance: 31 mL/min (by C-G formula based on Cr of 1.57).   Medical History: Past Medical History  Diagnosis Date  . Uterine cancer   . Diabetes mellitus     type II; peripheral neuropathy  . Hypertension   . GERD (gastroesophageal reflux disease)   . Peripheral vascular disease     s/p L BKA 08/2012  . Obesity   . Dyslipidemia   . Diastolic CHF, chronic     EF 50-55%, mild LVH and grade 1 diast. Dysfxn  . Osteoarthritis cervical spine   . DM retinopathy   . Sleep apnea     with CPAP  . History of shingles   . COPD (chronic obstructive pulmonary disease)   . Depression   . Peptic ulcer disease     duodenal  . Peptic stricture of esophagus   . Hiatal hernia   . Diverticulosis   . Arthritis   . ASCVD (arteriosclerotic cardiovascular disease)     on MRI brin  . CAD (coronary artery disease)     a. s/p multiple caths with nonobs CAD;   b. cath 1/10: pLAD 20%, mLAD 40%, pCFX 20%, mCFX 40%, pRCA 60-70%;   c.  Myoview 06/02/12: Low anterior wall scar, no ischemia, EF 37%. d. cath 06/20/2014 70% mid LCx, medical therapy, high risk for PCI due to close proximity to large OM   . Cardiomyopathy     a. Echo 05/31/12: Severe LVH, EF 40-45%, diffuse HK, grade 1 diastolic dysfunction, mild to moderate calcified aortic valve annulus, mild to  moderate aortic stenosis, MAC, mild MR, moderate LAE  . Asthma     Mild  . Pruritic condition     Idiopathic  . Hyperlipidemia   . Zoster   . Noncompliance   . CKD (chronic kidney disease)     Dr. Lorrene Reid    Medications:  Infusions:  . heparin      Assessment: [name] presented to the ED with CP. To start IV heparin for anticoagulation. Baseline CBC is ok and troponin is slightly elevated. She is not on anticoagulation PTA except for aspirin + plavix.   Goal of Therapy:  Heparin level 0.3-0.7 units/ml Monitor platelets by anticoagulation protocol: Yes   Plan:  - Heparin bolus 4000 units IV x 1 - Heparin gtt 1300 units/hr (previously therapeutic at this rate) - Check an 8 hour heparin level - Daily heparin level and CBC  Rumbarger, Rande Lawman 03/06/2015,12:41 PM

## 2015-03-06 NOTE — ED Notes (Addendum)
Per EMS, Patient is coming from home complaining of Chest pain starting last night. Burning, non-radiating chest pain. Patient was given 324 mg of Aspirin and 2 NiItro with Family this morning and no relief. Reports worse with palpation and sitting up. Denies any nausea, vomiting, dizziness. Left BKA. EMS Vitals: 194/82, 170 CBG, 74 HR, 99% on 2L. EKG is unremarkable. Sleeps with C-Pap and no chronic oxygen.

## 2015-03-06 NOTE — ED Provider Notes (Signed)
CSN: [id number]     Arrival date & time 03/06/15  1112 History   First MD Initiated Contact with Patient 03/06/15 1120     Chief Complaint  Patient presents with  . Chest Pain     (Consider location/radiation/quality/duration/timing/severity/associated sxs/prior Treatment) HPI Comments: [name] with history of CAD, DM, hyperlipidemia presents for chest pain that started last night.  She reports that the pain feels similar to when she has had a heart attack in the past.  She denies shortness of breath or cough.  No fever or chills.  The pain is mostly burning in nature without radiation.  She reports taking 2 sublignual Nitro and 325 mg ASA before presentation.  Pain did improve after nitro but not immediately and is much improved at this time compared to earlier.  Patient is a 81 y.o. female presenting with chest pain.  Chest Pain Associated symptoms: no abdominal pain, no back pain, no cough, no diaphoresis, no dizziness, no fatigue, no fever, no headache, no palpitations and no shortness of breath     Past Medical History  Diagnosis Date  . Uterine cancer   . Diabetes mellitus     type II; peripheral neuropathy  . Hypertension   . GERD (gastroesophageal reflux disease)   . Peripheral vascular disease     s/p L BKA 08/2012  . Obesity   . Dyslipidemia   . Diastolic CHF, chronic     a. EF 50-55%, mild LVH and grade 1 diast. Dysfxn b. Grade 3 Diastolic Dysfunction 76/7341  . Osteoarthritis cervical spine   . DM retinopathy   . Sleep apnea     with CPAP  . History of shingles   . COPD (chronic obstructive pulmonary disease)   . Depression   . Peptic ulcer disease     duodenal  . Peptic stricture of esophagus   . Hiatal hernia   . Diverticulosis   . Arthritis   . ASCVD (arteriosclerotic cardiovascular disease)     on MRI brin  . CAD (coronary artery disease)     a. s/p multiple caths with nonobs CAD;   b. cath 1/10: pLAD 20%, mLAD 40%, pCFX 20%, mCFX 40%, pRCA 60-70%;    c.  Myoview 06/02/12: Low anterior wall scar, no ischemia, EF 37%. d. cath 06/20/2014 70% mid LCx, medical therapy, high risk for PCI due to close proximity to large OM   . Cardiomyopathy     a. Echo 05/31/12: Severe LVH, EF 40-45%, diffuse HK, grade 1 diastolic dysfunction, mild to moderate calcified aortic valve annulus, mild to moderate aortic stenosis, MAC, mild MR, moderate LAE  . Asthma     Mild  . Pruritic condition     Idiopathic  . Hyperlipidemia   . Zoster   . Noncompliance   . CKD (chronic kidney disease)     Dr. Lorrene Reid   Past Surgical History  Procedure Laterality Date  . Tubal ligation  1967  . Knee arthroscopy  10/1998    Left  . Craniotomy  1997    Left for SDH  . Cataract extraction, bilateral  2005  . Hernia repair    . Esophagogastroduodenoscopy  04/04/2004  . Spine surgery      C-spine and lumbar surgery  . Cholecystectomy  2010  . Cardiac catheterization    . Dexa  7/05  . Amputation Left 09/04/2012    Procedure: AMPUTATION BELOW KNEE;  Surgeon: Angelia Mould, MD;  Location: [place];  Service: Vascular;  Laterality: Left;  . Abdominal angiogram N/A 08/31/2012    Procedure: ABDOMINAL ANGIOGRAM with runoff poss intervention;  Surgeon: Angelia Mould, MD;  Location: [facility] CATH LAB;  Service: Cardiovascular;  Laterality: N/A;  . Tubal ligation    . Left heart catheterization with coronary angiogram N/A 06/22/2014    Procedure: LEFT HEART CATHETERIZATION WITH CORONARY ANGIOGRAM;  Surgeon: Burnell Blanks, MD;  Location: [facility] CATH LAB;  Service: Cardiovascular;  Laterality: N/A;  . Eye surgery    . Brain surgery      1997 blood clot on the brain, then had to relieve fluid on the brain  . Multiple extractions with alveoloplasty N/A 08/01/2014    Procedure: EXTRACTIONS OF TEETH NUMBERS 7 8 9 10 11  AND 19 AND ALVEOLOPLASTY UPPER LEFT AND RIGHT QUADRANT;  Surgeon: Isac Caddy, DDS;  Location: [place];  Service: Oral Surgery;  Laterality: N/A;    Family History  Problem Relation Age of Onset  . Cancer - Other Mother     "Stomach" Cancer  . Diabetes Mother   . Heart disease Mother   . Stomach cancer Mother   . Hypertension Mother   . Lymphoma Father   . Hypertension Father   . Kidney disease Paternal Grandmother   . Asthma Other   . Diabetes Sister   . Rheum arthritis Mother   . Rheum arthritis Father    Social History  Substance Use Topics  . Smoking status: Never Smoker   . Smokeless tobacco: Never Used  . Alcohol Use: No     Comment: rare   OB History    No data available     Review of Systems  Constitutional: Negative for fever, chills, diaphoresis, appetite change and fatigue.  HENT: Negative for congestion, postnasal drip and rhinorrhea.   Eyes: Negative for pain and redness.  Respiratory: Negative for cough, chest tightness and shortness of breath.   Cardiovascular: Positive for chest pain. Negative for palpitations and leg swelling.  Gastrointestinal: Negative for abdominal pain, diarrhea and constipation.  Genitourinary: Negative for dysuria, urgency and hematuria.  Musculoskeletal: Negative for back pain and neck pain.  Skin: Negative for rash.  Neurological: Negative for dizziness, light-headedness and headaches.  Hematological: Does not bruise/bleed easily.      Allergies  Iohexol  Home Medications   Prior to Admission medications   Medication Sig Start Date End Date Taking? Authorizing Provider  acetaminophen (TYLENOL) 500 MG tablet Take 500 mg by mouth every 6 (six) hours as needed for pain. For pain   Yes Historical Provider, MD  albuterol (PROVENTIL HFA;VENTOLIN HFA) 108 (90 BASE) MCG/ACT inhaler Inhale 2 puffs into the lungs every 4 (four) hours as needed for wheezing or shortness of breath. 08/05/13  Yes Dorothy Spark, MD  albuterol (PROVENTIL) (2.5 MG/3ML) 0.083% nebulizer solution Take 3 mLs (2.5 mg total) by nebulization every 4 (four) hours as needed for wheezing or shortness of  breath. 12/08/13  Yes Robbie Lis, MD  allopurinol (ZYLOPRIM) 100 MG tablet TAKE 1 TABLET BY MOUTH DAILY 07/25/14  Yes Renato Shin, MD  amLODipine (NORVASC) 10 MG tablet TAKE 1 TABLET BY MOUTH DAILY 03/02/15  Yes Renato Shin, MD  aspirin 81 MG chewable tablet Chew 1 tablet (81 mg total) by mouth daily. Patient taking differently: Chew 325 mg by mouth daily.  06/27/14  Yes Almyra Deforest, PA  atorvastatin (LIPITOR) 40 MG tablet TAKE 1 TABLET BY MOUTH DAILY AT 6 PM 03/02/15  Yes Renato Shin, MD  calcitRIOL (  ROCALTROL) 0.25 MCG capsule TAKE ONE CAPSULE BY MOUTH EVERY DAY 04/29/14  Yes Renato Shin, MD  clopidogrel (PLAVIX) 75 MG tablet Take 1 tablet (75 mg total) by mouth daily. 06/27/14  Yes Almyra Deforest, PA  donepezil (ARICEPT) 5 MG tablet Take 1 tablet (5 mg total) by mouth at bedtime. 05/19/14  Yes Renato Shin, MD  feeding supplement (GLUCERNA SHAKE) LIQD Take 237 mLs by mouth every morning.    Yes Historical Provider, MD  Fluticasone-Salmeterol (ADVAIR DISKUS) 100-50 MCG/DOSE AEPB Inhale 1 puff into the lungs 2 (two) times daily. 07/12/13  Yes Renato Shin, MD  gabapentin (NEURONTIN) 300 MG capsule TAKE 1 CAPSULE BY MOUTH AT BEDTIME 05/27/14  Yes Renato Shin, MD  hydrALAZINE (APRESOLINE) 25 MG tablet TAKE 1 TABLET BY MOUTH THREE TIMES DAILY 03/02/15  Yes Renato Shin, MD  insulin NPH-regular Human (NOVOLIN 70/30) (70-30) 100 UNIT/ML injection Inject 18 Units into the skin daily with breakfast. Patient taking differently: Inject 18 Units into the skin daily with breakfast. Sliding scale. Only takes in the morning 02/17/15  Yes Renato Shin, MD  isosorbide mononitrate (IMDUR) 60 MG 24 hr tablet TAKE 1 TABLET BY MOUTH DAILY 02/06/15  Yes Dorothy Spark, MD  latanoprost (XALATAN) 0.005 % ophthalmic solution Place 1 drop into both eyes at bedtime.   Yes Historical Provider, MD  nitroGLYCERIN (NITROSTAT) 0.4 MG SL tablet Place 0.4 mg under the tongue every 5 (five) minutes as needed for chest pain.    Yes Historical  Provider, MD  pantoprazole (PROTONIX) 40 MG tablet TAKE 1 TABLET BY MOUTH TWICE DAILY 11/21/14  Yes Renato Shin, MD  sertraline (ZOLOFT) 100 MG tablet TAKE 1 TABLET BY MOUTH EVERY DAY 03/02/15  Yes Renato Shin, MD  ZETIA 10 MG tablet TAKE 1 TABLET BY MOUTH EVERY MORNING 02/15/15  Yes Renato Shin, MD  docusate sodium (COLACE) 100 MG capsule Take 100 mg by mouth 2 (two) times daily as needed for mild constipation (constipation).     Historical Provider, MD  furosemide (LASIX) 80 MG tablet Take 0.5 tablets (40 mg total) by mouth daily. Patient not taking: Reported on 03/06/2015 07/01/14   Burtis Junes, NP  HYDROcodone-acetaminophen [facility]) 10-325 MG per tablet Take one tablet by mouth every 4 hours as needed for pain 01/04/14   Blanchie Serve, MD  nebivolol (BYSTOLIC) 2.5 MG tablet Take 1 tablet (2.5 mg total) by mouth daily. Patient not taking: Reported on 03/06/2015 06/27/14   Almyra Deforest, PA  ONE TOUCH ULTRA TEST test strip USE TO TEST BLOOD SUGAR TWICE DAILY. 11/30/14   Renato Shin, MD  potassium chloride SA (K-DUR,KLOR-CON) 20 MEQ tablet Take 1 tablet (20 mEq total) by mouth daily. Patient not taking: Reported on 03/06/2015 07/01/14   Burtis Junes, NP   BP 151/66 mmHg  Pulse 62  Temp(Src) 98.9 F (37.2 C) (Oral)  Resp 20  Ht 5\' 6"  (1.676 m)  Wt 189 lb 14.4 oz (86.138 kg)  BMI 30.67 kg/m2  SpO2 96% Physical Exam  Constitutional: She is oriented to person, place, and time. No distress.  HENT:  Head: Normocephalic and atraumatic.  Right Ear: External ear normal.  Left Ear: External ear normal.  Mouth/Throat: Oropharynx is clear and moist.  Eyes: EOM are normal. Pupils are equal, round, and reactive to light.  Neck: Normal range of motion. Neck supple.  Cardiovascular: Normal rate, regular rhythm and intact distal pulses.   No murmur heard. Pulmonary/Chest: Effort normal. No respiratory distress. She has no wheezes. She  has no rales.  Abdominal: Soft. She exhibits no distension. There is  no tenderness.  Musculoskeletal: She exhibits no edema or tenderness.  Neurological: She is alert and oriented to person, place, and time.  Skin: Skin is warm and dry. She is not diaphoretic.  Vitals reviewed.   ED Course  Procedures (including critical care time) Labs Review Labs Reviewed  BASIC METABOLIC PANEL - Abnormal; Notable for the following:    Glucose, Bld 168 (*)    Creatinine, Ser 1.57 (*)    GFR calc non Af Amer 29 (*)    GFR calc Af Amer 34 (*)    All other components within normal limits  CBC - Abnormal; Notable for the following:    RDW 18.2 (*)    All other components within normal limits  GLUCOSE, CAPILLARY - Abnormal; Notable for the following:    Glucose-Capillary 217 (*)    All other components within normal limits  TROPONIN I - Abnormal; Notable for the following:    Troponin I 0.19 (*)    All other components within normal limits  GLUCOSE, CAPILLARY - Abnormal; Notable for the following:    Glucose-Capillary 204 (*)    All other components within normal limits  I-STAT TROPOININ, ED - Abnormal; Notable for the following:    Troponin i, poc 0.13 (*)    All other components within normal limits  HEPARIN LEVEL (UNFRACTIONATED)  CBC  TROPONIN I  TROPONIN I  BASIC METABOLIC PANEL  PROTIME-INR    Imaging Review Dg Chest Port 1 View  03/06/2015   CLINICAL DATA:  Chest pain, history of chronic congestive heart failure COPD and asthma  EXAM: PORTABLE CHEST - 1 VIEW  COMPARISON:  06/17/2014  FINDINGS: Moderate cardiac enlargement. Moderate vascular congestion and venous hypertension. Peribronchial cuffing with mild interstitial prominence. No consolidation or effusion.  IMPRESSION: Cardiac enlargement with vascular congestion and borderline interstitial prominence suggesting the possibility of mild interstitial pulmonary edema.   Electronically Signed   By: Skipper Cliche M.D.   On: 03/06/2015 12:18   I have personally reviewed and evaluated these images and  lab results as part of my medical decision-making.   EKG Interpretation   Date/Time:  Monday March 06 2015 11:24:37 EDT Ventricular Rate:  70 PR Interval:  170 QRS Duration: 94 QT Interval:  436 QTC Calculation: 470 R Axis:   179 Text Interpretation:  Normal sinus rhythm with sinus arrhythmia Septal  infarct , age undetermined Lateral infarct , age undetermined Abnormal ECG  No significant change since last tracing Confirmed by Lonia Skinner  [phone]) on 03/06/2015 12:07:15 PM      MDM  Patient presented with chest pain similar to previous MI.  Mild chest pain at presentation treated with Nitro in the ED with improvement.  EKG without acute ischemic changes.  Troponin elevated.  Cr elevated but at patient baseline.  Patient was started on IV nitro and heparin for NSTEMI.  Case was discussed with cardiology who saw and evaluated the patient and admitted her under their care for further treatment and evaluation. Final diagnoses:  None    1. Acute coronary syndrome, NSTEMI    Harvel Quale, MD 03/06/15 2211

## 2015-03-06 NOTE — Progress Notes (Signed)
[place] for heparin Indication: chest pain/ACS  Allergies  Allergen Reactions  . Iohexol      Code: RASH, Desc: [place] ON PT'S CHART ALLERGIC TO IV DYE 09/04/07/RM, Onset Date: [id number]     Patient Measurements: Height: 5\' 6"  (167.6 cm) Weight: 189 lb 14.4 oz (86.138 kg) IBW/kg (Calculated) : 59.3 Heparin Dosing Weight: 79.5kg  Vital Signs: Temp: 98.9 F (37.2 C) (09/19 2014) Temp Source: Oral (09/19 2014) BP: 178/81 mmHg (09/19 2145) Pulse Rate: 69 (09/19 2244)  Labs:  Recent Labs  03/06/15 1202 03/06/15 1915 03/06/15 2231  HGB 12.6  --   --   HCT 39.8  --   --   PLT 296  --   --   HEPARINUNFRC  --   --  0.86*  CREATININE 1.57*  --   --   TROPONINI  --  0.19*  --     Estimated Creatinine Clearance: 30 mL/min (by C-G formula based on Cr of 1.57).  Assessment: 81 y.o. female with chest pain for heparin  Goal of Therapy:  Heparin level 0.3-0.7 units/ml Monitor platelets by anticoagulation protocol: Yes   Plan:  Decrease Heparin 1150 units/hr F/U after cath tomorrow  Caryl Pina 03/06/2015,11:14 PM

## 2015-03-07 ENCOUNTER — Encounter (HOSPITAL_COMMUNITY): Payer: Self-pay | Admitting: General Practice

## 2015-03-07 ENCOUNTER — Encounter (HOSPITAL_COMMUNITY)
Admission: EM | Disposition: A | Discharge status: Home / Self Care | Payer: Self-pay | Source: Home / Self Care | Attending: Cardiovascular Disease

## 2015-03-07 DIAGNOSIS — I5032 Chronic diastolic (congestive) heart failure: Secondary | ICD-10-CM | POA: Insufficient documentation

## 2015-03-07 HISTORY — PX: CARDIAC CATHETERIZATION: SHX172

## 2015-03-07 LAB — BASIC METABOLIC PANEL
ANION GAP: 10 (ref 5–15)
Anion gap: 10 (ref 5–15)
BUN: 16 mg/dL (ref 6–20)
BUN: 15 mg/dL (ref 6–20)
CHLORIDE: 103 mmol/L (ref 101–111)
CO2: 25 mmol/L (ref 22–32)
CO2: 21 mmol/L — AB (ref 22–32)
CREATININE: 1.64 mg/dL — AB (ref 0.44–1.00)
CREATININE: 1.75 mg/dL — AB (ref 0.44–1.00)
Calcium: 8.5 mg/dL — ABNORMAL LOW (ref 8.9–10.3)
Calcium: 8.2 mg/dL — ABNORMAL LOW (ref 8.9–10.3)
Chloride: 106 mmol/L (ref 101–111)
GFR calc Af Amer: 30 mL/min — ABNORMAL LOW (ref >60)
GFR calc Af Amer: 32 mL/min — ABNORMAL LOW (ref >60)
GFR calc non Af Amer: 26 mL/min — ABNORMAL LOW (ref >60)
GFR, EST NON AFRICAN AMERICAN: 28 mL/min — AB (ref >60)
GLUCOSE: 212 mg/dL — AB (ref 65–99)
Glucose, Bld: 221 mg/dL — ABNORMAL HIGH (ref 65–99)
POTASSIUM: 4.1 mmol/L (ref 3.5–5.1)
Potassium: 4 mmol/L (ref 3.5–5.1)
SODIUM: 138 mmol/L (ref 135–145)
Sodium: 137 mmol/L (ref 135–145)

## 2015-03-07 LAB — CBC
HCT: 37.7 % (ref 36.0–46.0)
Hemoglobin: 11.5 g/dL — ABNORMAL LOW (ref 12.0–15.0)
MCH: 26.3 pg (ref 26.0–34.0)
MCHC: 30.5 g/dL (ref 30.0–36.0)
MCV: 86.1 fL (ref 78.0–100.0)
PLATELETS: 321 10*3/uL (ref 150–400)
RBC: 4.38 MIL/uL (ref 3.87–5.11)
RDW: 17.6 % — AB (ref 11.5–15.5)
WBC: 7.6 10*3/uL (ref 4.0–10.5)

## 2015-03-07 LAB — TROPONIN I
Troponin I: 0.19 ng/mL — ABNORMAL HIGH (ref <0.031)
Troponin I: 0.17 ng/mL — ABNORMAL HIGH (ref <0.031)

## 2015-03-07 LAB — GLUCOSE, CAPILLARY
GLUCOSE-CAPILLARY: 192 mg/dL — AB (ref 65–99)
GLUCOSE-CAPILLARY: 281 mg/dL — AB (ref 65–99)
Glucose-Capillary: 146 mg/dL — ABNORMAL HIGH (ref 65–99)
Glucose-Capillary: 290 mg/dL — ABNORMAL HIGH (ref 65–99)

## 2015-03-07 LAB — HEPARIN LEVEL (UNFRACTIONATED): Heparin Unfractionated: 0.73 IU/mL — ABNORMAL HIGH (ref 0.30–0.70)

## 2015-03-07 LAB — PROTIME-INR
INR: 1.23 (ref 0.00–1.49)
PROTHROMBIN TIME: 15.7 s — AB (ref 11.6–15.2)

## 2015-03-07 LAB — MRSA PCR SCREENING: MRSA BY PCR: NEGATIVE

## 2015-03-07 SURGERY — LEFT HEART CATH AND CORONARY ANGIOGRAPHY

## 2015-03-07 MED ORDER — FAMOTIDINE IN NACL 20-0.9 MG/50ML-% IV SOLN
INTRAVENOUS | Status: DC | PRN
  Administered 2015-03-07: 20 mg via INTRAVENOUS

## 2015-03-07 MED ORDER — DIPHENHYDRAMINE HCL 50 MG/ML IJ SOLN
INTRAMUSCULAR | Status: DC | PRN
  Administered 2015-03-07: 25 mg via INTRAVENOUS

## 2015-03-07 MED ORDER — NITROGLYCERIN 1 MG/10 ML FOR IR/CATH LAB
INTRA_ARTERIAL | Status: DC | PRN
  Administered 2015-03-07: 12:00:00

## 2015-03-07 MED ORDER — METHYLPREDNISOLONE SODIUM SUCC 125 MG IJ SOLR
INTRAMUSCULAR | Status: DC | PRN
Start: 2015-03-07 — End: 2015-03-07
  Administered 2015-03-07: 125 mg via INTRAVENOUS

## 2015-03-07 MED ORDER — FUROSEMIDE 10 MG/ML IJ SOLN
40.0000 mg | Freq: Once | INTRAMUSCULAR | Status: AC
  Administered 2015-03-07: 40 mg via INTRAVENOUS

## 2015-03-07 MED ORDER — VERAPAMIL HCL 2.5 MG/ML IV SOLN
INTRAVENOUS | Status: AC
  Filled 2015-03-07: qty 2

## 2015-03-07 MED ORDER — FUROSEMIDE 20 MG PO TABS
20.0000 mg | ORAL_TABLET | Freq: Two times a day (BID) | ORAL | Status: DC
  Administered 2015-03-08 – 2015-03-09 (×3): 20 mg via ORAL
  Filled 2015-03-07 (×3): qty 1

## 2015-03-07 MED ORDER — RANOLAZINE ER 500 MG PO TB12
500.0000 mg | ORAL_TABLET | Freq: Two times a day (BID) | ORAL | Status: DC
  Administered 2015-03-07 – 2015-03-09 (×4): 500 mg via ORAL
  Filled 2015-03-07 (×4): qty 1

## 2015-03-07 MED ORDER — HEPARIN SODIUM (PORCINE) 1000 UNIT/ML IJ SOLN
INTRAMUSCULAR | Status: DC | PRN
  Administered 2015-03-07: 4500 [IU] via INTRAVENOUS

## 2015-03-07 MED ORDER — FUROSEMIDE 10 MG/ML IJ SOLN
40.0000 mg | Freq: Once | INTRAMUSCULAR | Status: DC
  Filled 2015-03-07: qty 4

## 2015-03-07 MED ORDER — SODIUM CHLORIDE 0.9 % IJ SOLN: 3.0000 mL | INTRAMUSCULAR | Status: DC | PRN

## 2015-03-07 MED ORDER — LIDOCAINE HCL (PF) 1 % IJ SOLN
INTRAMUSCULAR | Status: AC
  Filled 2015-03-07: qty 30

## 2015-03-07 MED ORDER — NITROGLYCERIN 1 MG/10 ML FOR IR/CATH LAB
INTRA_ARTERIAL | Status: AC
  Filled 2015-03-07: qty 10

## 2015-03-07 MED ORDER — VERAPAMIL HCL 2.5 MG/ML IV SOLN
INTRA_ARTERIAL | Status: DC | PRN
  Administered 2015-03-07: 15 mL via INTRA_ARTERIAL

## 2015-03-07 MED ORDER — METHYLPREDNISOLONE SODIUM SUCC 125 MG IJ SOLR
INTRAMUSCULAR | Status: AC
  Filled 2015-03-07: qty 2

## 2015-03-07 MED ORDER — DIPHENHYDRAMINE HCL 50 MG/ML IJ SOLN
INTRAMUSCULAR | Status: AC
  Filled 2015-03-07: qty 1

## 2015-03-07 MED ORDER — ISOSORB DINITRATE-HYDRALAZINE 20-37.5 MG PO TABS
1.0000 | ORAL_TABLET | Freq: Three times a day (TID) | ORAL | Status: DC
  Administered 2015-03-07 – 2015-03-09 (×6): 1 via ORAL
  Filled 2015-03-07 (×6): qty 1

## 2015-03-07 MED ORDER — SODIUM CHLORIDE 0.9 % IV SOLN: 250.0000 mL | INTRAVENOUS | Status: DC | PRN

## 2015-03-07 MED ORDER — IOHEXOL 350 MG/ML SOLN
INTRAVENOUS | Status: DC | PRN
  Administered 2015-03-07: 75 mL via INTRAVENOUS

## 2015-03-07 MED ORDER — FAMOTIDINE IN NACL 20-0.9 MG/50ML-% IV SOLN
INTRAVENOUS | Status: AC
  Filled 2015-03-07: qty 50

## 2015-03-07 MED ORDER — SODIUM CHLORIDE 0.9 % WEIGHT BASED INFUSION
1.0000 mL/kg/h | INTRAVENOUS | Status: AC
  Administered 2015-03-07: 1 mL/kg/h via INTRAVENOUS

## 2015-03-07 MED ORDER — SODIUM CHLORIDE 0.9 % IJ SOLN
3.0000 mL | Freq: Two times a day (BID) | INTRAMUSCULAR | Status: DC
  Administered 2015-03-07 – 2015-03-09 (×4): 3 mL via INTRAVENOUS

## 2015-03-07 SURGICAL SUPPLY — 10 items
CATH INFINITI 5FR ANG PIGTAIL (CATHETERS) ×2 IMPLANT
CATH OPTITORQUE TIG 4.0 5F (CATHETERS) ×2 IMPLANT
DEVICE RAD COMP TR BAND LRG (VASCULAR PRODUCTS) ×2 IMPLANT
GLIDESHEATH SLEND A-KIT 6F 22G (SHEATH) ×2 IMPLANT
KIT HEART LEFT (KITS) ×2 IMPLANT
PACK CARDIAC CATHETERIZATION (CUSTOM PROCEDURE TRAY) ×2 IMPLANT
SYR MEDRAD MARK V 150ML (SYRINGE) ×2 IMPLANT
TRANSDUCER W/STOPCOCK (MISCELLANEOUS) ×2 IMPLANT
TUBING CIL FLEX 10 FLL-RA (TUBING) ×2 IMPLANT
WIRE SAFE-T 1.5MM-J .035X260CM (WIRE) ×2 IMPLANT

## 2015-03-07 NOTE — Progress Notes (Addendum)
Assumed care off going RN @ [phone]; patient sitting low fowler's in bed with O2 @ 3L via [place]; no c/o's ; denies pain; consent signed; heart cath later in am; call bell in reach; husband at bedside  @1018  patient for heart cath via bed; patient alert & awake; patient returned back to room @ 1206; right radial site  AR band, shows no bleeding at this time ; no c/o's and patient denies pain; call bell in reach

## 2015-03-07 NOTE — Interval H&P Note (Signed)
History and Physical Interval Note:  03/07/2015 11:08 AM  [name]  has presented today for surgery, with the diagnosis of Unstable Angina. The various methods of treatment have been discussed with the patient and family. After consideration of risks, benefits and other options for treatment, the patient has consented to  Procedure(s): Left Heart Cath and Coronary Angiography (N/A) With Possible Percutaneous Coronary Intervention as a surgical intervention .    The patient's history has been reviewed, patient examined, no change in status, stable for surgery.  I have reviewed the patient's chart and labs.  Questions were answered to the patient's satisfaction.     [place], [place]  Cath Lab Visit (complete for each Cath Lab visit)  Clinical Evaluation Leading to the Procedure:   ACS: Yes.    Non-ACS:    Anginal Classification: CCS IV  Anti-ischemic medical therapy: Maximal Therapy (2 or more classes of medications)  Non-Invasive Test Results: No non-invasive testing performed  Prior CABG: No previous CABG   TIMI SCORE  Patient Information:  TIMI Score is 6   A/NSTEMI and high-risk features for short-term risk of death or nonfatal MI Revascularization of the presumed culprit artery  A (9)  Indication: 11; Score: 9 TIMI SCORE  Patient Information:  TIMI Score is 6   A/NSTEMI and high-risk features for short-term risk of death or nonfatal MI  Revascularization of multiple coronary arteries when the culprit artery cannot clearly be determined  A (9)  Indication: 12; Score: 9  HARDING, DAVID W, MD

## 2015-03-07 NOTE — Progress Notes (Signed)
    Subjective:  Pt back from cath lab. Doing well without CP or dyspnea. Eating clears.  Objective:  Vital Signs in the last 24 hours: Temp:  [97 F (36.1 C)-98.9 F (37.2 C)] 98.2 F (36.8 C) (09/20 0704) Pulse Rate:  [0-132] 68 (09/20 1147) Resp:  [8-30] 9 (09/20 1147) BP: (139-178)/(57-95) 144/78 mmHg (09/20 1147) SpO2:  [0 %-99 %] 94 % (09/20 1147) Weight:  [189 lb 14.4 oz (86.138 kg)-190 lb 12.8 oz (86.546 kg)] 190 lb 12.8 oz (86.546 kg) (09/20 0515)  Intake/Output from previous day: 09/19 0701 - 09/20 0700 In: 240 [P.O.:240] Out: -   Physical Exam: Pt is alert and oriented, pleasant obese woman in NAD HEENT: normal Neck: JVP - normal Lungs: CTA bilaterally CV: RRR without murmur or gallop Abd: soft, NT, Positive BS, no hepatomegaly Ext: trace edema on right, left BKA Skin: warm/dry no rash   Lab Results:  Recent Labs  03/06/15 1202 03/07/15 0034  WBC 6.5 7.6  HGB 12.6 11.5*  PLT 296 321    Recent Labs  03/07/15 0034 03/07/15 0417  NA 138 137  K 4.0 4.1  CL 103 106  CO2 25 21*  GLUCOSE 221* 212*  BUN 15 16  CREATININE 1.75* 1.64*    Recent Labs  03/07/15 0034 03/07/15 0621  TROPONINI 0.19* 0.17*    Cardiac Studies: Cath note reviewed  Assessment/Plan:  Acute on chronic diastolic CHF CAD, med management planned Obesity HTN with CHF Diabetes, Type II  Cath note reviewed. Dr Ellyn Hack has made multiple medication changes including addition of BiDil and Ranexa. Wean off of IV NTG this afternoon. Anticipate discharge home tomorrow. Check BMET in am.   Sherren Mocha, M.D. 03/07/2015, 3:13 PM

## 2015-03-07 NOTE — Progress Notes (Signed)
[place] for heparin Indication: chest pain/ACS  Allergies  Allergen Reactions  . Iohexol      Code: RASH, Desc: [place] ON PT'S CHART ALLERGIC TO IV DYE 09/04/07/RM, Onset Date: [id number]     Patient Measurements: Height: 5\' 6"  (167.6 cm) Weight: 190 lb 12.8 oz (86.546 kg) IBW/kg (Calculated) : 59.3 Heparin Dosing Weight: 79.5kg  Vital Signs: Temp: 98.2 F (36.8 C) (09/20 0704) Temp Source: Oral (09/20 0704) BP: 168/64 mmHg (09/20 0215) Pulse Rate: 65 (09/20 0215)  Labs:  Recent Labs  03/06/15 1202 03/06/15 1915 03/06/15 2231 03/07/15 0034 03/07/15 0417 03/07/15 0621  HGB 12.6  --   --  11.5*  --   --   HCT 39.8  --   --  37.7  --   --   PLT 296  --   --  321  --   --   LABPROT  --   --   --  15.7*  --   --   INR  --   --   --  1.23  --   --   HEPARINUNFRC  --   --  0.86* 0.73*  --   --   CREATININE 1.57*  --   --  1.75* 1.64*  --   TROPONINI  --  0.19*  --  0.19*  --  0.17*    Estimated Creatinine Clearance: 28.8 mL/min (by C-G formula based on Cr of 1.64).  Assessment: 81 y.o. female with CP/ACS/UA continuing on heparin per pharmacy. Hg 11.5 (down), plt wnl. No AC PTA. INR 1.23. No bleed documented.  HL 0.73 - daily HL drawn at 0030 (~2h after dose decrease, likely still on the way down). Heparin was decreased to 1150 units/h and cath scheduled at 0730 today - will not check another level prior since already past time for cath.   Goal of Therapy:  Heparin level 0.3-0.7 units/ml Monitor platelets by anticoagulation protocol: Yes   Plan:  - Heparin @1150  units/h - Daily HL and CBC - F/u anticoag plans post-cath 9/20 - Monitor s/sx bleeding  Elicia Lamp, PharmD Clinical Pharmacist Pager [phone] 03/07/2015 8:55 AM

## 2015-03-07 NOTE — Care Management Note (Addendum)
Case Management Note  Patient Details  Name: [name] MRN: [id number] Date of Birth: [birth year]  Subjective/Objective:     Pt admitted for chest pain. PMH: of diastolic heart failure, nonobstructive CAD, Type II DM, HTN, HLD, OSA (on CPAP), PVD, and CKD.                Action/Plan: CM will continue to monitor for disposition needs.   Expected Discharge Date:                  Expected Discharge Plan:  [place]  In-House Referral:     Discharge planning Services  CM Consult  Post Acute Care Choice:    Choice offered to:     DME Arranged:    DME Agency:     HH Arranged:    [place] Agency:     Status of Service:  In process, will continue to follow  Medicare Important Message Given:    Date Medicare IM Given:    Medicare IM give by:    Date Additional Medicare IM Given:    Additional Medicare Important Message give by:     If discussed at [place] of Stay Meetings, dates discussed:    Additional Comments: 1127 03-09-15 Jacqlyn Krauss, RN,BSN [phone] Pt plan for d/c today. No needs at this time.   Bethena Roys, RN 03/07/2015, 11:02 AM

## 2015-03-07 NOTE — Progress Notes (Signed)
Patient is refusing CPAP for the night. RT will continue to monitor. 

## 2015-03-07 NOTE — Progress Notes (Signed)
UR Completed Brenda Graves-Bigelow, RN,BSN [phone]  

## 2015-03-08 DIAGNOSIS — R7989 Other specified abnormal findings of blood chemistry: Secondary | ICD-10-CM

## 2015-03-08 DIAGNOSIS — I5032 Chronic diastolic (congestive) heart failure: Secondary | ICD-10-CM

## 2015-03-08 DIAGNOSIS — R079 Chest pain, unspecified: Secondary | ICD-10-CM

## 2015-03-08 LAB — GLUCOSE, CAPILLARY
GLUCOSE-CAPILLARY: 325 mg/dL — AB (ref 65–99)
GLUCOSE-CAPILLARY: 234 mg/dL — AB (ref 65–99)
Glucose-Capillary: 325 mg/dL — ABNORMAL HIGH (ref 65–99)

## 2015-03-08 LAB — BASIC METABOLIC PANEL
ANION GAP: 10 (ref 5–15)
BUN: 17 mg/dL (ref 6–20)
CO2: 24 mmol/L (ref 22–32)
Calcium: 8.4 mg/dL — ABNORMAL LOW (ref 8.9–10.3)
Chloride: 101 mmol/L (ref 101–111)
Creatinine, Ser: 1.83 mg/dL — ABNORMAL HIGH (ref 0.44–1.00)
GFR calc Af Amer: 28 mL/min — ABNORMAL LOW (ref >60)
GFR calc non Af Amer: 24 mL/min — ABNORMAL LOW (ref >60)
GLUCOSE: 381 mg/dL — AB (ref 65–99)
POTASSIUM: 4.4 mmol/L (ref 3.5–5.1)
Sodium: 135 mmol/L (ref 135–145)

## 2015-03-08 NOTE — Progress Notes (Signed)
Patient Name: [name] Date of Encounter: 03/08/2015  Principal Problem:   Chest pain at rest Active Problems:   DM (diabetes mellitus), type 2, uncontrolled, with renal complications   Hyperlipidemia   Obstructive sleep apnea   Hypertension associated with diabetes   Gait instability: Status post left BKA   Elevated troponin   Chronic diastolic heart failure   [id number] [name] is a 81 y.o. female with past medical history of diastolic heart failure, nonobstructive CAD, Type II DM, HTN, HLD, OSA (on CPAP), PVD, and CKD who presented to [facility] ED 9/19 for chest pain.  SUBJECTIVE  Feeling well. No chest pain, sob or palpitations.   CURRENT MEDS . allopurinol  100 mg Oral Daily  . amLODipine  10 mg Oral Daily  . aspirin  81 mg Oral Daily  . atorvastatin  40 mg Oral q1800  . calcitRIOL  0.25 mcg Oral Daily  . clopidogrel  75 mg Oral Daily  . donepezil  5 mg Oral QHS  . ezetimibe  10 mg Oral q morning - 10a  . furosemide  20 mg Oral BID  . gabapentin  300 mg Oral QHS  . insulin aspart protamine- aspart  18 Units Subcutaneous Q breakfast  . isosorbide-hydrALAZINE  1 tablet Oral TID  . latanoprost  1 drop Both Eyes QHS  . mometasone-formoterol  2 puff Inhalation BID  . pantoprazole  40 mg Oral BID  . ranolazine  500 mg Oral BID  . sertraline  100 mg Oral Daily  . sodium chloride  3 mL Intravenous Q12H    OBJECTIVE  Filed Vitals:   03/08/15 0510 03/08/15 0709 03/08/15 0809 03/08/15 0829  BP: 139/68 166/59 152/99 162/62  Pulse: 62 61 57 73  Temp:    97.9 F (36.6 Christina)  TempSrc:    Oral  Resp: 18 14 26 23   Height:      Weight:      SpO2: 100% 100% 85% 96%    Intake/Output Summary (Last 24 hours) at 03/08/15 0853 Last data filed at 03/08/15 0831  Gross per 24 hour  Intake    420 ml  Output      0 ml  Net    420 ml   Filed Weights   03/06/15 1739 03/07/15 0515 03/08/15 0400  Weight: 189 lb 14.4 oz (86.138 kg) 190 lb 12.8 oz (86.546 kg) 193  lb (87.544 kg)    PHYSICAL EXAM  General: Pleasant, NAD. Neuro: Alert and oriented X 3. Moves all extremities spontaneously. Psych: Normal affect. HEENT:  Normal  Neck: Supple without bruits. + JVD. Lungs:  Resp regular and unlabored. Bibasilar rales.  Heart: RRR no s3, s4, or murmurs. Abdomen: Soft, non-tender, non-distended, BS + x 4.  Extremities: No clubbing, cyanosis or edema. DP/PT/Radials 2+ and equal bilaterally. left BKA  Accessory Clinical Findings  CBC  Recent Labs  03/06/15 1202 03/07/15 0034  WBC 6.5 7.6  HGB 12.6 11.5*  HCT 39.8 37.7  MCV 86.3 86.1  PLT 296 681   Basic Metabolic Panel  Recent Labs  03/07/15 0417 03/08/15 0322  NA 137 135  K 4.1 4.4  CL 106 101  CO2 21* 24  GLUCOSE 212* 381*  BUN 16 17  CREATININE 1.64* 1.83*  CALCIUM 8.2* 8.4*   Liver Function Tests No results for input(s): AST, ALT, ALKPHOS, BILITOT, PROT, ALBUMIN in the last 72 hours. No results for input(s): LIPASE, AMYLASE in the last 72 hours. Cardiac Enzymes  Recent  Labs  03/06/15 1915 03/07/15 0034 03/07/15 0621  TROPONINI 0.19* 0.19* 0.17*    TELE  NSR with occasional PVCs.    Cath 03/07/15 Conclusion     Prox RCA lesion, 50% stenosed. Stable from previous angiography  Prox Cx to Mid Cx lesion, 75% stenosed. Stable from previous angiography  There is mild left ventricular systolic dysfunction. Severely elevated LVEDP of 33-38 mmHg suggestive of significant diastolic heart failure   With stable coronary angiography in the setting of mild troponin elevation and elevated LVEDP, the most likely etiology for the positive troponin in this woman is from supply versus demand mismatch in the setting of diastolic heart failure.   Plan:  Standard post radial cath care with TR band removal.  DC IV heparin  Would recommend aggressive treatment of diastolic heart failure including diuresis, afterload reduction.  Convert Hydralazine PO & IV NTG to BiDil  37.5/20 TID  IV Lasix 40 mg x 1 today, followed by PO 20 mg PO BID in AM  Will add Ranexa for microvascular ischemia.  I don't know that she was ready for discharge today. Potentially could be ready for discharge by tomorrow provided she is stable.    Radiology/Studies  Dg Chest Port 1 View  03/06/2015   CLINICAL DATA:  Chest pain, history of chronic congestive heart failure COPD and asthma  EXAM: PORTABLE CHEST - 1 VIEW  COMPARISON:  06/17/2014  FINDINGS: Moderate cardiac enlargement. Moderate vascular congestion and venous hypertension. Peribronchial cuffing with mild interstitial prominence. No consolidation or effusion.  IMPRESSION: Cardiac enlargement with vascular congestion and borderline interstitial prominence suggesting the possibility of mild interstitial pulmonary edema.   Electronically Signed   By: Skipper Cliche M.D.   On: 03/06/2015 12:18    ASSESSMENT AND PLAN  1. CAD - cath  03/07/15 showed stable coronary angiography in the setting of mild troponin elevation and elevated LVEDP.  - discontinued Hydralazine PO & IV NTG. Added BiDil 37.5/20 TID. - Given Iv lasix 40mg  x 1. Now on po lasix 20mg  BID. - Ranexa 500mg  BID added for microvascular ischemia. - Her creatinine increased to 1.83 from 1.64.  - continue ASA, statin and plavix - Not on BB due to COPD  2. Acute on chronic diastolic failure - as above  3. HTN - Continue amlodipine. BP still elevated. Consider increasing dose.   4. DM - Continue current regimen  5. HL -Continue statin  6. CKD, Stage 3 - baseline creatinine appears to be ~ 1.5 - As above - Avoid nephrotoxic agent  Signed, Bhagat,Bhavinkumar PA-Christina   I have personally seen and examined this patient with B. Bhagat, PA-Christina. I agree with the assessment and plan as outlined above. Presented with chest pain with subtle elevation troponin. Coronary anatomy stable by cath yesterday with no focal targets for PCI. Continue medical management. Bidil and  Ranexa added. She feels well this am. Lungs clear. No edema. Left BKA. RRR. No chest pain or SOB. Mild worsening renal function post cath. Will continue current meds. Ambulate today when family brings in prosthesis and repeat BMET in am. I would favor keeping her here today to make sure renal function is stable tomorrow post contrast load (she had acute renal failure in past post cath). Also would like to ambulate today on new meds and make sure she tolerates. Likely d/Christina home tomorrow.   Leza Apsey 03/08/2015 11:52 AM

## 2015-03-08 NOTE — Progress Notes (Signed)
Inpatient Diabetes Program Recommendations  AACE/ADA: New Consensus Statement on Inpatient Glycemic Control (2015)  Target Ranges:  Prepandial:   less than 140 mg/dL      Peak postprandial:   less than 180 mg/dL (1-2 hours)      Critically ill patients:  140 - 180 mg/dL   Review of Glycemic Control:Results for LILIANN, FILE (MRN [id number]) as of 03/08/2015 09:24  Ref. Range 03/07/2015 07:06 03/07/2015 12:11 03/07/2015 16:23 03/07/2015 20:46 03/08/2015 07:32  Glucose-Capillary Latest Ref Range: 65-99 mg/dL 192 (H) 146 (H) 290 (H) 281 (H) 325 (H)    Diabetes history: dm 2  Outpatient Diabetes medications: 70/30 18 units in am Current orders for Inpatient glycemic control: 70/30 18 units in the am  Inpatient Diabetes Program Recommendations: In addition to 70/30 am insulin :  Correction (SSI): Please consider addition of correction insulin  while on steroid therapy-please start with moderate correction tidwc and HS scale  HgbA1C of 6.3%  Thank you Rosita Kea, RN, MSN, CDE  Diabetes Inpatient Program Office: [phone] Pager: [phone] 8:00 am to 5:00 pm

## 2015-03-09 ENCOUNTER — Encounter (HOSPITAL_COMMUNITY): Payer: Self-pay | Admitting: Physician Assistant

## 2015-03-09 DIAGNOSIS — I10 Essential (primary) hypertension: Secondary | ICD-10-CM

## 2015-03-09 DIAGNOSIS — N183 Chronic kidney disease, stage 3 (moderate): Secondary | ICD-10-CM

## 2015-03-09 LAB — BASIC METABOLIC PANEL
Anion gap: 7 (ref 5–15)
BUN: 26 mg/dL — ABNORMAL HIGH (ref 6–20)
CHLORIDE: 105 mmol/L (ref 101–111)
CO2: 25 mmol/L (ref 22–32)
CREATININE: 1.94 mg/dL — AB (ref 0.44–1.00)
Calcium: 8.2 mg/dL — ABNORMAL LOW (ref 8.9–10.3)
GFR calc non Af Amer: 23 mL/min — ABNORMAL LOW (ref >60)
GFR, EST AFRICAN AMERICAN: 26 mL/min — AB (ref >60)
Glucose, Bld: 293 mg/dL — ABNORMAL HIGH (ref 65–99)
POTASSIUM: 3.8 mmol/L (ref 3.5–5.1)
SODIUM: 137 mmol/L (ref 135–145)

## 2015-03-09 MED ORDER — POTASSIUM CHLORIDE CRYS ER 10 MEQ PO TBCR: 10.0000 meq | EXTENDED_RELEASE_TABLET | Freq: Every day | ORAL | Status: DC

## 2015-03-09 MED ORDER — ISOSORB DINITRATE-HYDRALAZINE 20-37.5 MG PO TABS
1.0000 | ORAL_TABLET | Freq: Three times a day (TID) | ORAL | Status: DC
Start: 2015-03-09 — End: 2015-03-14

## 2015-03-09 MED ORDER — FUROSEMIDE 20 MG PO TABS: 20.0000 mg | ORAL_TABLET | Freq: Two times a day (BID) | ORAL | Status: DC

## 2015-03-09 MED ORDER — INSULIN ASPART 100 UNIT/ML ~~LOC~~ SOLN: 0.0000 [IU] | Freq: Three times a day (TID) | SUBCUTANEOUS | Status: DC

## 2015-03-09 MED ORDER — RANOLAZINE ER 500 MG PO TB12: 500.0000 mg | ORAL_TABLET | Freq: Two times a day (BID) | ORAL | Status: DC

## 2015-03-09 NOTE — Progress Notes (Signed)
Patient Name: [name] Date of Encounter: 03/09/2015  Principal Problem:   Chest pain at rest Active Problems:   DM (diabetes mellitus), type 2, uncontrolled, with renal complications   Hyperlipidemia   Obstructive sleep apnea   Hypertension associated with diabetes   Gait instability: Status post left BKA   Elevated troponin   Chronic diastolic heart failure   WCH:ENIDPOEUM [name] is a 81 y.o. female with past medical history of diastolic heart failure, nonobstructive CAD, Type II DM, HTN, HLD, OSA (on CPAP), PVD, and CKD who presented to [facility] ED 9/19 for chest pain.  SUBJECTIVE  Feeling well. No chest pain, sob or palpitations. Walked yesterday without any discomfort.  CURRENT MEDS . allopurinol  100 mg Oral Daily  . amLODipine  10 mg Oral Daily  . aspirin  81 mg Oral Daily  . atorvastatin  40 mg Oral q1800  . calcitRIOL  0.25 mcg Oral Daily  . clopidogrel  75 mg Oral Daily  . donepezil  5 mg Oral QHS  . ezetimibe  10 mg Oral q morning - 10a  . furosemide  20 mg Oral BID  . gabapentin  300 mg Oral QHS  . insulin aspart  0-15 Units Subcutaneous TID WC  . insulin aspart protamine- aspart  18 Units Subcutaneous Q breakfast  . isosorbide-hydrALAZINE  1 tablet Oral TID  . latanoprost  1 drop Both Eyes QHS  . mometasone-formoterol  2 puff Inhalation BID  . pantoprazole  40 mg Oral BID  . ranolazine  500 mg Oral BID  . sertraline  100 mg Oral Daily  . sodium chloride  3 mL Intravenous Q12H    OBJECTIVE  Filed Vitals:   03/08/15 2300 03/08/15 2350 03/09/15 0356 03/09/15 0816  BP:  144/56 145/53   Pulse: 65 63 53   Temp:  97.7 F (36.5 Christina) 97.4 F (36.3 Christina)   TempSrc:  Oral Oral   Resp: 20 21 19    Height:      Weight:   197 lb 1.6 oz (89.404 kg)   SpO2: 94% 98% 94% 98%    Intake/Output Summary (Last 24 hours) at 03/09/15 0944 Last data filed at 03/09/15 0100  Gross per 24 hour  Intake    360 ml  Output      0 ml  Net    360 ml   Filed  Weights   03/07/15 0515 03/08/15 0400 03/09/15 0356  Weight: 190 lb 12.8 oz (86.546 kg) 193 lb (87.544 kg) 197 lb 1.6 oz (89.404 kg)    PHYSICAL EXAM  General: Pleasant, NAD. Neuro: Alert and oriented X 3. Moves all extremities spontaneously. Psych: Normal affect. HEENT:  Normal  Neck: Supple without bruits. No JVD. Lungs:  Resp regular and unlabored. Faint bibasilar rales.  Heart: RRR no s3, s4, or murmurs. Abdomen: Soft, non-tender, non-distended, BS + x 4.  Extremities: No clubbing, cyanosis or edema. DP/PT/Radials 2+ and equal bilaterally. left BKA. Right radial cath site without hematoma or bruit.   Accessory Clinical Findings  CBC  Recent Labs  03/06/15 1202 03/07/15 0034  WBC 6.5 7.6  HGB 12.6 11.5*  HCT 39.8 37.7  MCV 86.3 86.1  PLT 296 353   Basic Metabolic Panel  Recent Labs  03/08/15 0322 03/09/15 0411  NA 135 137  K 4.4 3.8  CL 101 105  CO2 24 25  GLUCOSE 381* 293*  BUN 17 26*  CREATININE 1.83* 1.94*  CALCIUM 8.4* 8.2*   Liver  Function Tests No results for input(s): AST, ALT, ALKPHOS, BILITOT, PROT, ALBUMIN in the last 72 hours. No results for input(s): LIPASE, AMYLASE in the last 72 hours. Cardiac Enzymes  Recent Labs  03/06/15 1915 03/07/15 0034 03/07/15 0621  TROPONINI 0.19* 0.19* 0.17*    TELE  NSR with occasional PVCs.    Cath 03/07/15 Conclusion     Prox RCA lesion, 50% stenosed. Stable from previous angiography  Prox Cx to Mid Cx lesion, 75% stenosed. Stable from previous angiography  There is mild left ventricular systolic dysfunction. Severely elevated LVEDP of 33-38 mmHg suggestive of significant diastolic heart failure   With stable coronary angiography in the setting of mild troponin elevation and elevated LVEDP, the most likely etiology for the positive troponin in this woman is from supply versus demand mismatch in the setting of diastolic heart failure.   Plan:  Standard post radial cath care with TR band  removal.  DC IV heparin  Would recommend aggressive treatment of diastolic heart failure including diuresis, afterload reduction.  Convert Hydralazine PO & IV NTG to BiDil 37.5/20 TID  IV Lasix 40 mg x 1 today, followed by PO 20 mg PO BID in AM  Will add Ranexa for microvascular ischemia.  I don't know that she was ready for discharge today. Potentially could be ready for discharge by tomorrow provided she is stable.    Radiology/Studies  Dg Chest Port 1 View  03/06/2015   CLINICAL DATA:  Chest pain, history of chronic congestive heart failure COPD and asthma  EXAM: PORTABLE CHEST - 1 VIEW  COMPARISON:  06/17/2014  FINDINGS: Moderate cardiac enlargement. Moderate vascular congestion and venous hypertension. Peribronchial cuffing with mild interstitial prominence. No consolidation or effusion.  IMPRESSION: Cardiac enlargement with vascular congestion and borderline interstitial prominence suggesting the possibility of mild interstitial pulmonary edema.   Electronically Signed   By: Skipper Cliche M.D.   On: 03/06/2015 12:18    ASSESSMENT AND PLAN  1. CAD - cath  03/07/15 showed stable coronary angiography in the setting of mild troponin elevation and elevated LVEDP.  - discontinued Hydralazine PO & IV NTG. Continue BiDil 37.5/20 TID,  po lasix 20mg  BID and ranexa 500mg  BID.  - Her creatinine increased further to 1.94. Ambulated well yesterday.  - continue ASA, statin and plavix - Not on BB due to COPD  2. Acute on chronic diastolic failure - as above  3. HTN - Continue amlodipine. BP still elevated. Consider increasing dose.   4. DM - Continue current regimen  5. HL -Continue statin  6. CKD, Stage 3 - baseline creatinine appears to be ~ 1.5 - As above - Avoid nephrotoxic agent  Signed, ChristinaBhavinkumar PA-Christina  I have personally seen and examined this patient with B Bhagat, PA-Christina.  I agree with the assessment and plan as outlined above. She is chest pain free on Ranexa  and Isordil and tolerating well. Renal function overall stable post cath. Exam with clear lungs, RRR, no loud murmurs, no LE edema. Will continue current meds and d/Christina home today. Follow up one week with Dr. Meda Coffee or office APP and check BMET that day.   MCALHANY,CHRISTOPHER 03/09/2015 10:19 AM

## 2015-03-09 NOTE — Discharge Summary (Signed)
Discharge Summary   Patient ID: [name],  MRN: [id number], DOB/AGE: 1930-03-23 81 y.o.  Admit date: 03/06/2015 Discharge date: 03/09/2015  Primary Care Provider: Renato Shin Primary Cardiologist: Dr. Meda Coffee  Discharge Diagnoses Principal Problem:   Chest pain at rest Active Problems:   DM (diabetes mellitus), type 2, uncontrolled, with renal complications   Hyperlipidemia   Obstructive sleep apnea   Hypertension associated with diabetes   Gait instability: Status post left BKA   Elevated troponin   Chronic diastolic heart failure   Allergies Allergies  Allergen Reactions  . Iohexol      Code: RASH, Desc: [place] ON PT'S CHART ALLERGIC TO IV DYE 09/04/07/RM, Onset Date: [id number]     Procedures  Conclusion     Prox RCA lesion, 50% stenosed. Stable from previous angiography  Prox Cx to Mid Cx lesion, 75% stenosed. Stable from previous angiography  There is mild left ventricular systolic dysfunction. Severely elevated LVEDP of 33-38 mmHg suggestive of significant diastolic heart failure   With stable coronary angiography in the setting of mild troponin elevation and elevated LVEDP, the most likely etiology for the positive troponin in this woman is from supply versus demand mismatch in the setting of diastolic heart failure.   Plan:  Standard post radial cath care with TR band removal.  DC IV heparin  Would recommend aggressive treatment of diastolic heart failure including diuresis, afterload reduction.  Convert Hydralazine PO & IV NTG to BiDil 37.5/20 TID  IV Lasix 40 mg x 1 today, followed by PO 20 mg PO BID in AM  Will add Ranexa for microvascular ischemia.  I don't know that she was ready for discharge today. Potentially could be ready for discharge by tomorrow provided she is stable.      History of Present Illness  [name] is a 81 y.o. female with past medical history of diastolic heart failure, nonobstructive  CAD, Type II DM, HTN, HLD, OSA (on CPAP), PVD, and CKD who presented 03/06/15 to [facility] ED  via EMS for chest pain.   The patient reported having "burning" left pectoral region chest pain that started last night prior to admission and was mild, keeping her from sleeping well overnight. In morning, around 9:00AM, the pain increased in intensity to an 8/10 while she was still lying in bed. She reported having associated shortness of breath, left arm pain, and right leg pain. She denied any associated nausea or vomiting. In ED, she was started on Heparin and NTG drip, she reported her pain improved to 5/10.  She denied having any recent chest pain in the past several months. She does reported one episode of diaphoresis and left arm pain that occurred 2 weeks ago but resolved after a few hours and has not reoccurred until now. Recently noticed dizziness after taking hydralazine.   Her last echocardiogram was in 06/2014, showing an EF of 50% to 55% with no regional wall motion abnormalities. Grade 3 diastolic dysfunction was noted along with moderately calcified annulus of mitral valve and left atrium was mildly dilated.   She was last hospitalized for NSTEMI in 06/2014 and had a Myoview initially which was of intermediate risk. She underwent left heart catheterization by Dr. Angelena Form on 06/22/2014 which showed 70% stenosis in distal AV circumflex groove after takeoff of OM1, 20% stenosis proximal LAD, 30% mid LAD, diffuse 50% proximal to mid RCA stenosis. Medical management was recommended of her distal Circumflex stenosis. The lesion was noted just to  be past the takeoff of the large caliber OM branch and PCI would potentially compromise flow into the larger OM branch. It was recommended at that time if she continues to have angina despite optimal medical therapy, one could consider PCI of the distal AV groove Circumflex stenosis.    She is not on BB due to history of bradycardia and COPD. Her baseline  creatinine appears to be ~ 1.5. Did bumped to 2.13 following cath in 06/2014.  Hospital Course  She was admitted for unstable angina with elevated troponin of 0.13. She was placed on IV heparin, IV NTG, continued home meds. Given gentle hydration overnight prior to cath. Peak of troponin was 0.19. Cath 03/07/15 showed stable coronary angiography in the setting of mild troponin elevation and elevated LVEDP. Discontinued Hydralazine PO & IV NTG. Added BiDil 37.5/20 TID and ranexa 500mg  BID. Given Iv lasix 40mg  x 1 post cath and then  po lasix 20mg  BID daily. Post cath her creatinine increased to 1.83 from 1.64. She kept in hospital for extra day as she had acute renal failure in past post cath. No further chest pain. Ambulated without difficulty.   She has been seen by Dr. Angelena Form today and deemed ready for discharge home. All follow-up appointments have been scheduled. Discharge medications are listed below. At discharge her creatinine was 1.94, she will need Bmet during outpatient visit.   Discharge Vitals Blood pressure 145/53, pulse 53, temperature 98.1 F (36.7 C), temperature source Oral, resp. rate 19, height 5\' 6"  (1.676 m), weight 197 lb 1.6 oz (89.404 kg), SpO2 98 %.  Filed Weights   03/07/15 0515 03/08/15 0400 03/09/15 0356  Weight: 190 lb 12.8 oz (86.546 kg) 193 lb (87.544 kg) 197 lb 1.6 oz (89.404 kg)    CBC  Recent Labs  03/07/15 0034  WBC 7.6  HGB 11.5*  HCT 37.7  MCV 86.1  PLT 119   Basic Metabolic Panel  Recent Labs  03/08/15 0322 03/09/15 0411  NA 135 137  K 4.4 3.8  CL 101 105  CO2 24 25  GLUCOSE 381* 293*  BUN 17 26*  CREATININE 1.83* 1.94*  CALCIUM 8.4* 8.2*   Cardiac Enzymes  Recent Labs  03/06/15 1915 03/07/15 0034 03/07/15 0621  TROPONINI 0.19* 0.19* 0.17*    Disposition  Pt is being discharged home today in good condition.  Follow-up Plans & Appointments  Follow-up Information    Follow up with Melina Copa, PA-C On 03/16/2015.    Specialties:  Cardiology, Radiology   Why:  @11 :00 for cardiology follow up   Contact information:   [address] [place] [address] [phone]           Discharge Instructions    Call MD for:  redness, tenderness, or signs of infection (pain, swelling, redness, odor or green/yellow discharge around incision site)    Complete by:  As directed      Diet - low sodium heart healthy    Complete by:  As directed      Increase activity slowly    Complete by:  As directed            F/u Labs/Studies: Bmet during outpatient visit.   Discharge Medications    Medication List    STOP taking these medications        hydrALAZINE 25 MG tablet  Commonly known as:  APRESOLINE     isosorbide mononitrate 60 MG 24 hr tablet  Commonly known as:  IMDUR  nebivolol 2.5 MG tablet  Commonly known as:  BYSTOLIC      TAKE these medications        acetaminophen 500 MG tablet  Commonly known as:  TYLENOL  Take 500 mg by mouth every 6 (six) hours as needed for pain. For pain     albuterol 108 (90 BASE) MCG/ACT inhaler  Commonly known as:  PROVENTIL HFA;VENTOLIN HFA  Inhale 2 puffs into the lungs every 4 (four) hours as needed for wheezing or shortness of breath.     albuterol (2.5 MG/3ML) 0.083% nebulizer solution  Commonly known as:  PROVENTIL  Take 3 mLs (2.5 mg total) by nebulization every 4 (four) hours as needed for wheezing or shortness of breath.     allopurinol 100 MG tablet  Commonly known as:  ZYLOPRIM  TAKE 1 TABLET BY MOUTH DAILY     amLODipine 10 MG tablet  Commonly known as:  NORVASC  TAKE 1 TABLET BY MOUTH DAILY     aspirin 81 MG chewable tablet  Chew 1 tablet (81 mg total) by mouth daily.     atorvastatin 40 MG tablet  Commonly known as:  LIPITOR  TAKE 1 TABLET BY MOUTH DAILY AT 6 PM     calcitRIOL 0.25 MCG capsule  Commonly known as:  ROCALTROL  TAKE ONE CAPSULE BY MOUTH EVERY DAY     clopidogrel 75 MG tablet  Commonly known  as:  PLAVIX  Take 1 tablet (75 mg total) by mouth daily.     docusate sodium 100 MG capsule  Commonly known as:  COLACE  Take 100 mg by mouth 2 (two) times daily as needed for mild constipation (constipation).     donepezil 5 MG tablet  Commonly known as:  ARICEPT  Take 1 tablet (5 mg total) by mouth at bedtime.     feeding supplement (GLUCERNA SHAKE) Liqd  Take 237 mLs by mouth every morning.     Fluticasone-Salmeterol 100-50 MCG/DOSE Aepb  Commonly known as:  ADVAIR DISKUS  Inhale 1 puff into the lungs 2 (two) times daily.     furosemide 20 MG tablet  Commonly known as:  LASIX  Take 1 tablet (20 mg total) by mouth 2 (two) times daily.     gabapentin 300 MG capsule  Commonly known as:  NEURONTIN  TAKE 1 CAPSULE BY MOUTH AT BEDTIME     HYDROcodone-acetaminophen 10-325 MG per tablet  Commonly known as:  NORCO  Take one tablet by mouth every 4 hours as needed for pain     insulin NPH-regular Human (70-30) 100 UNIT/ML injection  Commonly known as:  NOVOLIN 70/30  Inject 18 Units into the skin daily with breakfast.     isosorbide-hydrALAZINE 20-37.5 MG per tablet  Commonly known as:  BIDIL  Take 1 tablet by mouth 3 (three) times daily.     latanoprost 0.005 % ophthalmic solution  Commonly known as:  XALATAN  Place 1 drop into both eyes at bedtime.     nitroGLYCERIN 0.4 MG SL tablet  Commonly known as:  NITROSTAT  Place 0.4 mg under the tongue every 5 (five) minutes as needed for chest pain.     ONE TOUCH ULTRA TEST test strip  Generic drug:  glucose blood  USE TO TEST BLOOD SUGAR TWICE DAILY.     pantoprazole 40 MG tablet  Commonly known as:  PROTONIX  TAKE 1 TABLET BY MOUTH TWICE DAILY     potassium chloride 10 MEQ tablet  Commonly known as:  K-DUR,KLOR-CON  Take 1 tablet (10 mEq total) by mouth daily.     ranolazine 500 MG 12 hr tablet  Commonly known as:  RANEXA  Take 1 tablet (500 mg total) by mouth 2 (two) times daily.     sertraline 100 MG tablet    Commonly known as:  ZOLOFT  TAKE 1 TABLET BY MOUTH EVERY DAY     ZETIA 10 MG tablet  Generic drug:  ezetimibe  TAKE 1 TABLET BY MOUTH EVERY MORNING        Duration of Discharge Encounter   Greater than 30 minutes including physician time.  Signed, Bhagat,Bhavinkumar PA-C 03/09/2015, 12:04 PM

## 2015-03-09 NOTE — Care Management Important Message (Signed)
Important Message  Patient Details  Name: [name] MRN: [id number] Date of Birth: [birth year]   Medicare Important Message Given:  Yes-second notification given    Delorse Lek 03/09/2015, 9:17 AM

## 2015-03-09 NOTE — Progress Notes (Signed)
Pt discharged to home, discharge teaching complete, verbalized understanding, left facility via W/C accompanied by spouse. Edward Qualia RN

## 2015-03-09 NOTE — Discharge Instructions (Signed)
No driving for 7. No lifting over 5 lbs for 1 week. No sexual activity for 1 week. Keep procedure site clean & dry. If you notice increased pain, swelling, bleeding or pus, call/return!  You may shower, but no soaking baths/hot tubs/pools for 1 week.

## 2015-03-14 ENCOUNTER — Other Ambulatory Visit: Payer: Self-pay | Admitting: *Deleted

## 2015-03-14 MED ORDER — ISOSORB DINITRATE-HYDRALAZINE 20-37.5 MG PO TABS: 1.0000 | ORAL_TABLET | Freq: Three times a day (TID) | ORAL | Status: DC

## 2015-03-15 ENCOUNTER — Encounter: Payer: Self-pay | Admitting: Physician Assistant

## 2015-03-15 NOTE — Progress Notes (Addendum)
Cardiology Office Note Date:  03/16/2015  Patient ID:  Christina, [name] [birth year], MRN [id number] PCP:  Renato Shin, MD  Cardiologist:  Dr. Meda Coffee   Chief Complaint: f/u hospitalization for CHF  History of Present Illness: [name] is a 81 y.o. female with history of chronic combined CHF (EF 45-50% by [facility] 02/2015), CAD (NSTEMI 06/2014 with LHC showing moderate disease treated medically, recent cath 02/2015 with stable anatomy), DM, HTN, HLD, OSA (on CPAP), PVD s/p L BKA 08/2012, CKD stage III, COPD, bradycardia (not on BB due to this) who presents for post-hospital follow-up. Last echo 06/2014: EF 50-55%, no rWMA, grade 3 DD, mildly dilated LA.  She was admitted 9/19-9/22/16 with chest pain, SOB, left arm pain and right leg pain. She was admitted for [place] and had mild troponin elevation to 0.19. Cath 03/07/15 showed stable coronary angiography and elevated LVEDP - elevated troponin was felt more likely supply/demand in setting of CHF. Bidil was added in place of hydralazine, along with Ranexa. She also received IV Lasix post-cath. She had AKI on CKD with elevation of Cr to 1.83 and was kept in the hospital for further monitoring. At discharge her Cr was 1.94.   She comes in to clinic today doing well. She is accompanied by her husband. She reports recent BP occasionally running low - she is 132/54 in clinic today. She denies any recurrent CP, SOB, orthopnea, LEE. Weight is stable. The biggest issue is her medication list. Although hydralazine and Imdur were recently stopped in the hospital, she and her husband did not realize they were stopped and she has continued to take them. This is in addition to the Bidil. (Unfortunately the discharge AVS at the hospital does not have a section that instructs patients which medications are being stopped - they just disappear from the list - hers is quite long.) She also went back to taking Lasix just once daily (as opposed to BID per discharge  med rec) because that's what her PCP previously instructed her to do before recent hospitalization. Her husband also has had her on a full dose aspirin despite med list instructing her to take 81mg  daily. No bleeding issues.  Past Medical History  Diagnosis Date  . Uterine cancer   . Diabetes mellitus     type II; peripheral neuropathy  . Hypertension   . GERD (gastroesophageal reflux disease)   . Peripheral vascular disease     a. s/p L BKA 08/2012.  . Obesity   . Dyslipidemia   . Chronic combined systolic and diastolic CHF (congestive heart failure)     a. EF 50-55%, mild LVH and grade 1 diast. Dysfxn b. Grade 3 Diastolic Dysfunction 17/0017. b. 02/2015: EF 45-50% by cath.  . Osteoarthritis cervical spine   . DM retinopathy   . Sleep apnea     with CPAP  . History of shingles   . COPD (chronic obstructive pulmonary disease)   . Depression   . Peptic ulcer disease     duodenal  . Peptic stricture of esophagus   . Hiatal hernia   . Diverticulosis   . Arthritis   . CAD (coronary artery disease)     a. s/p multiple caths with nonobs CAD;   b. cath 1/10: pLAD 20%, mLAD 40%, pCFX 20%, mCFX 40%, pRCA 60-70%;   c.  Myoview 06/02/12: Low anterior wall scar, no ischemia, EF 37%. d. cath 06/20/2014 70% mid LCx, medical therapy, high risk for PCI due to close  proximity to large OM e. cath 03/07/15 pro RCA 50% & pro to mid Cx 75%, both stable, LVEDP 33-23mm Hg-> medical management  . Cardiomyopathy     a. Echo 05/31/12: EF 40-45%. b. Echo 06/2014: EF 50-55%. c. Cath 02/2015: EF 45-50%.  . Asthma     Mild  . Pruritic condition     Idiopathic  . Zoster   . Noncompliance   . CKD (chronic kidney disease), stage III     Dr. Lorrene Reid  . Bradycardia     a. Not on BB due to this.    Past Surgical History  Procedure Laterality Date  . Tubal ligation  1967  . Knee arthroscopy  10/1998    Left  . Craniotomy  1997    Left for SDH  . Cataract extraction, bilateral  2005  . Hernia repair    .  Esophagogastroduodenoscopy  04/04/2004  . Spine surgery      C-spine and lumbar surgery  . Cholecystectomy  2010  . Cardiac catheterization    . Dexa  7/05  . Amputation Left 09/04/2012    Procedure: AMPUTATION BELOW KNEE;  Surgeon: Angelia Mould, MD;  Location: [place];  Service: Vascular;  Laterality: Left;  . Abdominal angiogram N/A 08/31/2012    Procedure: ABDOMINAL ANGIOGRAM with runoff poss intervention;  Surgeon: Angelia Mould, MD;  Location: [facility] CATH LAB;  Service: Cardiovascular;  Laterality: N/A;  . Tubal ligation    . Left heart catheterization with coronary angiogram N/A 06/22/2014    Procedure: LEFT HEART CATHETERIZATION WITH CORONARY ANGIOGRAM;  Surgeon: Burnell Blanks, MD;  Location: [facility] CATH LAB;  Service: Cardiovascular;  Laterality: N/A;  . Eye surgery    . Brain surgery      1997 blood clot on the brain, then had to relieve fluid on the brain  . Multiple extractions with alveoloplasty N/A 08/01/2014    Procedure: EXTRACTIONS OF TEETH NUMBERS 7 8 9 10 11  AND 19 AND ALVEOLOPLASTY UPPER LEFT AND RIGHT QUADRANT;  Surgeon: Isac Caddy, DDS;  Location: [place];  Service: Oral Surgery;  Laterality: N/A;  . Cardiac catheterization N/A 03/07/2015    Procedure: Left Heart Cath and Coronary Angiography;  Surgeon: Leonie Man, MD;  Location: [place] CV LAB;  Service: Cardiovascular;  Laterality: N/A;    Current Outpatient Prescriptions  Medication Sig Dispense Refill  . acetaminophen (TYLENOL) 500 MG tablet Take 500 mg by mouth every 6 (six) hours as needed for pain. For pain    . albuterol (PROVENTIL HFA;VENTOLIN HFA) 108 (90 BASE) MCG/ACT inhaler Inhale 2 puffs into the lungs every 4 (four) hours as needed for wheezing or shortness of breath. 1 Inhaler 3  . albuterol (PROVENTIL) (2.5 MG/3ML) 0.083% nebulizer solution Take 3 mLs (2.5 mg total) by nebulization every 4 (four) hours as needed for wheezing or shortness of breath. 75 mL 12  . allopurinol  (ZYLOPRIM) 100 MG tablet TAKE 1 TABLET BY MOUTH DAILY 30 tablet 0  . amLODipine (NORVASC) 10 MG tablet TAKE 1 TABLET BY MOUTH DAILY 30 tablet 0  . aspirin 81 MG chewable tablet Chew 1 tablet (81 mg total) by mouth daily. (Patient taking differently: Chew 325 mg by mouth daily. )    . atorvastatin (LIPITOR) 40 MG tablet TAKE 1 TABLET BY MOUTH DAILY AT 6 PM 30 tablet 0  . calcitRIOL (ROCALTROL) 0.25 MCG capsule TAKE ONE CAPSULE BY MOUTH EVERY DAY 30 capsule 0  . clopidogrel (PLAVIX) 75 MG tablet  Take 1 tablet (75 mg total) by mouth daily. 30 tablet 11  . docusate sodium (COLACE) 100 MG capsule Take 100 mg by mouth 2 (two) times daily as needed for mild constipation (constipation).     Marland Kitchen donepezil (ARICEPT) 5 MG tablet Take 1 tablet (5 mg total) by mouth at bedtime. 30 tablet 11  . feeding supplement (GLUCERNA SHAKE) LIQD Take 237 mLs by mouth every morning.     . Fluticasone-Salmeterol (ADVAIR DISKUS) 100-50 MCG/DOSE AEPB Inhale 1 puff into the lungs 2 (two) times daily. 1 each 11  . furosemide (LASIX) 20 MG tablet Take 1 tablet (20 mg total) by mouth 2 (two) times daily. (Patient taking differently: Take 20 mg by mouth daily. ) 60 tablet 3  . gabapentin (NEURONTIN) 300 MG capsule TAKE 1 CAPSULE BY MOUTH AT BEDTIME 30 capsule 0  . hydrALAZINE (APRESOLINE) 25 MG tablet TK 1 T PO TID  0  . HYDROcodone-acetaminophen (NORCO) 10-325 MG per tablet Take one tablet by mouth every 4 hours as needed for pain 180 tablet 0  . insulin NPH-regular Human (NOVOLIN 70/30) (70-30) 100 UNIT/ML injection Inject 18 Units into the skin daily with breakfast. (Patient taking differently: Inject 18 Units into the skin daily with breakfast. Sliding scale. Only takes in the morning) 10 mL 11  . isosorbide-hydrALAZINE (BIDIL) 20-37.5 MG tablet Take 1 tablet by mouth 3 (three) times daily. 90 tablet 0  . latanoprost (XALATAN) 0.005 % ophthalmic solution Place 1 drop into both eyes at bedtime.    . nitroGLYCERIN (NITROSTAT) 0.4 MG  SL tablet Place 0.4 mg under the tongue every 5 (five) minutes as needed for chest pain.     . ONE TOUCH ULTRA TEST test strip USE TO TEST BLOOD SUGAR TWICE DAILY. 100 each 3  . pantoprazole (PROTONIX) 40 MG tablet TAKE 1 TABLET BY MOUTH TWICE DAILY 60 tablet 0  . potassium chloride SA (K-DUR,KLOR-CON) 10 MEQ tablet Take 1 tablet (10 mEq total) by mouth daily. 30 tablet 3  . ranolazine (RANEXA) 500 MG 12 hr tablet Take 1 tablet (500 mg total) by mouth 2 (two) times daily. 60 tablet 3  . sertraline (ZOLOFT) 100 MG tablet TAKE 1 TABLET BY MOUTH EVERY DAY 30 tablet 0  . ZETIA 10 MG tablet TAKE 1 TABLET BY MOUTH EVERY MORNING 30 tablet 0   No current facility-administered medications for this visit.    Allergies:   Iohexol   Social History:  The patient  reports that she has never smoked. She has never used smokeless tobacco. She reports that she does not drink alcohol or use illicit drugs.   Family History:  The patient's family history includes Asthma in her other; Cancer - Other in her mother; Diabetes in her mother and sister; Heart disease in her mother; Hypertension in her father and mother; Kidney disease in her paternal grandmother; Lymphoma in her father; Rheum arthritis in her father and mother; Stomach cancer in her mother; Stroke in her paternal grandmother. There is no history of Heart attack.*  ROS:  Please see the history of present illness.    All other systems are reviewed and otherwise negative.   PHYSICAL EXAM:  VS:  BP 132/54 mmHg  Pulse 71  Ht 5\' 6"  (1.676 m)  Wt 197 lb (89.359 kg)  BMI 31.81 kg/m2 BMI: Body mass index is 31.81 kg/(m^2). Well nourished obese AAF, in no acute distress HEENT: normocephalic, atraumatic Neck: no JVD, carotid bruits or masses Cardiac:  normal S1, S2;  RRR; no murmurs, rubs, or gallops Lungs:  clear to auscultation bilaterally, no wheezing, rhonchi or rales Abd: soft, nontender, no hepatomegaly, + BS MS: no deformity or atrophy Ext: no  edema, right radial cath site with resolving ecchymosis, no hematoma, good pulse Skin: warm and dry, no rash Neuro:  moves all extremities spontaneously, no focal abnormalities noted, follows commands Psych: euthymic mood, full affect   EKG:  Done today shows NSR 71bpm, lateral infarct age undetermined, nonspecific ST-T changes, QTc 477ms, no acute change from prior.  Recent Labs: 06/08/2014: Pro B Natriuretic peptide (BNP) 1249.0* 06/17/2014: B Natriuretic Peptide 1985.7*; Magnesium 1.5 08/18/2014: TSH 1.30 10/25/2014: ALT 10 03/07/2015: Hemoglobin 11.5*; Platelets 321 03/09/2015: BUN 26*; Creatinine, Ser 1.94*; Potassium 3.8; Sodium 137  08/18/2014: Cholesterol 118; HDL 68.30; LDL Cholesterol 31; Total CHOL/HDL Ratio 2; Triglycerides 96.0; VLDL 19.2   Estimated Creatinine Clearance: 24.7 mL/min (by C-G formula based on Cr of 1.94).   Wt Readings from Last 3 Encounters:  03/16/15 197 lb (89.359 kg)  03/09/15 197 lb 1.6 oz (89.404 kg)  02/17/15 203 lb (92.08 kg)     Other studies reviewed: Additional studies/records reviewed today include: summarized above  ASSESSMENT AND PLAN:  1. Chronic combined CHF - appears euvolemic. See above regarding medication confusion. Will keep her on Lasix once daily given that she is doing well on this dose and had recent AKI on CKD. Given patient reports of intermittent low BP, will stop Imdur and continue Bidil + separate rx for Hydralazine (reviewed with Dr. Radford Pax). I asked her to follow BP at home and call if running >130/80. She has an automatic cuff she uses. 2. CKD stage III - recheck BMET today. She follows with Dr. Lorrene Reid. 3. CAD as above - QTC stable on Ranexa. No recurrent angina. Continue BABY ASPIRIN and Plavix. 4. Essential HTN - follow BP with above med adjustments. 5. H/o sinus bradycardia - stable. Not on BB due to this.  Disposition: F/u with Dr. Meda Coffee in 3 months.  Current medicines are reviewed at length with the patient today.  The  patient did not have any concerns regarding medicines.  Raechel Ache PA-C 03/16/2015 11:28 AM     [address] [place] [address] [phone] (office)  [phone] (fax)

## 2015-03-16 ENCOUNTER — Other Ambulatory Visit: Payer: Self-pay | Admitting: *Deleted

## 2015-03-16 ENCOUNTER — Encounter: Payer: Self-pay | Admitting: Physician Assistant

## 2015-03-16 ENCOUNTER — Ambulatory Visit (INDEPENDENT_AMBULATORY_CARE_PROVIDER_SITE_OTHER): Payer: Medicare Other | Admitting: Physician Assistant

## 2015-03-16 VITALS — BP 132/54 | HR 71 | Ht 66.0 in | Wt 197.0 lb

## 2015-03-16 DIAGNOSIS — N183 Chronic kidney disease, stage 3 unspecified: Secondary | ICD-10-CM

## 2015-03-16 DIAGNOSIS — I10 Essential (primary) hypertension: Secondary | ICD-10-CM

## 2015-03-16 DIAGNOSIS — I5042 Chronic combined systolic (congestive) and diastolic (congestive) heart failure: Secondary | ICD-10-CM

## 2015-03-16 DIAGNOSIS — I2510 Atherosclerotic heart disease of native coronary artery without angina pectoris: Secondary | ICD-10-CM

## 2015-03-16 DIAGNOSIS — R001 Bradycardia, unspecified: Secondary | ICD-10-CM

## 2015-03-16 LAB — BASIC METABOLIC PANEL
BUN: 28 mg/dL — ABNORMAL HIGH (ref 6–23)
CHLORIDE: 103 meq/L (ref 96–112)
CO2: 29 meq/L (ref 19–32)
CREATININE: 1.99 mg/dL — AB (ref 0.40–1.20)
Calcium: 8.9 mg/dL (ref 8.4–10.5)
GFR: 30.74 mL/min — ABNORMAL LOW (ref >60.00)
Glucose, Bld: 217 mg/dL — ABNORMAL HIGH (ref 70–99)
POTASSIUM: 3.9 meq/L (ref 3.5–5.1)
Sodium: 140 mEq/L (ref 135–145)

## 2015-03-16 MED ORDER — ASPIRIN EC 81 MG PO TBEC: 81.0000 mg | DELAYED_RELEASE_TABLET | Freq: Every day | ORAL | Status: AC

## 2015-03-16 MED ORDER — FUROSEMIDE 20 MG PO TABS
20.0000 mg | ORAL_TABLET | Freq: Every day | ORAL | Status: DC
Start: 2015-03-16 — End: 2015-04-19

## 2015-03-16 NOTE — Addendum Note (Signed)
Addended by: Freada Bergeron on: 03/16/2015 11:50 AM   Modules accepted: Orders, Medications

## 2015-03-16 NOTE — Patient Instructions (Addendum)
Medication Instructions:  Your physician recommends that you continue on your current medications as directed. Please refer to the Current Medication list given to you today. Aspirin should be 81 mg baby aspirin a day Lasix should be 20 mg taking 1 tablet daily   Labwork: TODAY:  BMET  Testing/Procedures: None ordered  Follow-Up: Your physician wants you to follow-up in: [place].  You will receive a reminder letter in the mail two months in advance. If you don't receive a letter, please call our office to schedule the follow-up appointment.   Any Other Special Instructions Will Be Listed Below (If Applicable). Keep a check on your blood pressure.  If it gets higher than 130-80 then please call our office.

## 2015-03-17 ENCOUNTER — Other Ambulatory Visit: Payer: Self-pay | Admitting: *Deleted

## 2015-03-17 ENCOUNTER — Telehealth: Payer: Self-pay

## 2015-03-17 NOTE — Telephone Encounter (Signed)
Called patient about her lab results. Per Melina Copa PA, Baseline Cr variable, may be somewhere between 1.5-2.0. Creatinine remains elevated from recent admission but is stable compared to recent discharge level. She recently self-decreased Lasix to once daily so we will continue this regimen. Since she was feeling well, will continue current regimen. Please fax copy to Dr. Sanda Klein office and ask patient to make a soon appointment with her nephrologist to further discuss kidney function. She should f/u PCP to discuss blood sugar/DM as her CBG was very high on this lab. Patient verbalized understanding.

## 2015-03-20 ENCOUNTER — Other Ambulatory Visit: Payer: Self-pay | Admitting: Endocrinology

## 2015-03-21 ENCOUNTER — Other Ambulatory Visit: Payer: Self-pay | Admitting: *Deleted

## 2015-03-27 ENCOUNTER — Other Ambulatory Visit: Payer: Self-pay | Admitting: Endocrinology

## 2015-03-31 ENCOUNTER — Other Ambulatory Visit: Payer: Self-pay | Admitting: Endocrinology

## 2015-04-03 ENCOUNTER — Telehealth: Payer: Self-pay | Admitting: Endocrinology

## 2015-04-03 NOTE — Telephone Encounter (Signed)
Please advise below.

## 2015-04-03 NOTE — Telephone Encounter (Signed)
Patient ask if you can send a fax to [facility] a request for a prosthetic legs, and ask for [facility]

## 2015-04-04 ENCOUNTER — Telehealth: Payer: Self-pay | Admitting: Endocrinology

## 2015-04-04 NOTE — Telephone Encounter (Signed)
Left pt a Vm on both home and cell phone that Dr.Ellison has printed out this request and it can be picked up and faxed

## 2015-04-04 NOTE — Telephone Encounter (Signed)
i printed 

## 2015-04-07 NOTE — Telephone Encounter (Signed)
error 

## 2015-04-08 ENCOUNTER — Other Ambulatory Visit: Payer: Self-pay | Admitting: Endocrinology

## 2015-04-17 ENCOUNTER — Other Ambulatory Visit: Payer: Self-pay | Admitting: Endocrinology

## 2015-04-19 ENCOUNTER — Ambulatory Visit
Admission: RE | Admit: 2015-04-19 | Discharge: 2015-04-19 | Disposition: A | Discharge status: Home / Self Care | Payer: Medicare Other | Source: Ambulatory Visit | Attending: Endocrinology | Admitting: Endocrinology

## 2015-04-19 ENCOUNTER — Ambulatory Visit (INDEPENDENT_AMBULATORY_CARE_PROVIDER_SITE_OTHER): Payer: Medicare Other | Admitting: Endocrinology

## 2015-04-19 ENCOUNTER — Encounter: Payer: Self-pay | Admitting: Endocrinology

## 2015-04-19 VITALS — BP 142/84 | HR 78 | Temp 98.5°F | Ht 66.0 in | Wt 205.0 lb

## 2015-04-19 DIAGNOSIS — N189 Chronic kidney disease, unspecified: Secondary | ICD-10-CM | POA: Diagnosis not present

## 2015-04-19 DIAGNOSIS — R05 Cough: Secondary | ICD-10-CM

## 2015-04-19 DIAGNOSIS — R059 Cough, unspecified: Secondary | ICD-10-CM

## 2015-04-19 DIAGNOSIS — E1165 Type 2 diabetes mellitus with hyperglycemia: Secondary | ICD-10-CM

## 2015-04-19 DIAGNOSIS — E1122 Type 2 diabetes mellitus with diabetic chronic kidney disease: Secondary | ICD-10-CM | POA: Diagnosis not present

## 2015-04-19 LAB — POCT GLYCOSYLATED HEMOGLOBIN (HGB A1C): HEMOGLOBIN A1C: 6.7

## 2015-04-19 MED ORDER — INSULIN NPH ISOPHANE & REGULAR (70-30) 100 UNIT/ML ~~LOC~~ SUSP: 14.0000 [IU] | Freq: Every day | SUBCUTANEOUS | Status: DC

## 2015-04-19 MED ORDER — FLUTICASONE-SALMETEROL 100-50 MCG/DOSE IN AEPB: 1.0000 | INHALATION_SPRAY | Freq: Two times a day (BID) | RESPIRATORY_TRACT | Status: AC

## 2015-04-19 MED ORDER — ALBUTEROL SULFATE HFA 108 (90 BASE) MCG/ACT IN AERS: 2.0000 | INHALATION_SPRAY | RESPIRATORY_TRACT | Status: AC | PRN

## 2015-04-19 MED ORDER — AZITHROMYCIN 500 MG PO TABS: 500.0000 mg | ORAL_TABLET | Freq: Every day | ORAL | Status: DC

## 2015-04-19 MED ORDER — FUROSEMIDE 40 MG PO TABS: 40.0000 mg | ORAL_TABLET | Freq: Every day | ORAL | Status: DC

## 2015-04-19 NOTE — Progress Notes (Signed)
Subjective:    Patient ID: [name], female    DOB: [birth year], 81 y.o.   MRN: [id number]  HPI Pt returns for f/u of diabetes mellitus: DM type: Insulin-requiring type 2 Dx'ed: 7124 Complications: polyneuropathy, nephropathy, CAD, and PAD.  Therapy: insulin since 1991 GDM: never DKA: never Severe hypoglycemia: last episode was in 2014 Pancreatitis: never Other: therapy is limited by pt's request for least expensive insulin, and by her need for a simple regimen; husband administers insulin to her.   Interval history: I asked husband, who says pt takes 18 units qam and 10 units qpm.  However, pt sometimes skips am insulin, and usually skips pm insulin.   Pt states 1 week of slight wheezing in the chest, and assoc prod-quality cough.  She takes advair intermittently.   Past Medical History  Diagnosis Date  . Uterine cancer ([place])   . Diabetes mellitus     type II; peripheral neuropathy  . Hypertension   . GERD (gastroesophageal reflux disease)   . Peripheral vascular disease ([place])     a. s/p L BKA 08/2012.  . Obesity   . Dyslipidemia   . Chronic combined systolic and diastolic CHF (congestive heart failure) ([facility])     a. EF 50-55%, mild LVH and grade 1 diast. Dysfxn b. Grade 3 Diastolic Dysfunction 58/0998. b. 02/2015: EF 45-50% by cath.  . Osteoarthritis cervical spine   . DM retinopathy ([place])   . Sleep apnea     with CPAP  . History of shingles   . COPD (chronic obstructive pulmonary disease) ([place])   . Depression   . Peptic ulcer disease     duodenal  . Peptic stricture of esophagus   . Hiatal hernia   . Diverticulosis   . Arthritis   . CAD (coronary artery disease)     a. s/p multiple caths with nonobs CAD;   b. cath 1/10: pLAD 20%, mLAD 40%, pCFX 20%, mCFX 40%, pRCA 60-70%;   c.  Myoview 06/02/12: Low anterior wall scar, no ischemia, EF 37%. d. cath 06/20/2014 70% mid LCx, medical therapy, high risk for PCI due to close proximity to large OM e. cath 03/07/15 pro  RCA 50% & pro to mid Cx 75%, both stable, LVEDP 33-84mm Hg-> medical management  . Cardiomyopathy ([place])     a. Echo 05/31/12: EF 40-45%. b. Echo 06/2014: EF 50-55%. c. Cath 02/2015: EF 45-50%.  . Asthma     Mild  . Pruritic condition     Idiopathic  . Zoster   . Noncompliance   . CKD (chronic kidney disease), stage III     Dr. Lorrene Reid  . Bradycardia     a. Not on BB due to this.    Past Surgical History  Procedure Laterality Date  . Tubal ligation  1967  . Knee arthroscopy  10/1998    Left  . Craniotomy  1997    Left for SDH  . Cataract extraction, bilateral  2005  . Hernia repair    . Esophagogastroduodenoscopy  04/04/2004  . Spine surgery      C-spine and lumbar surgery  . Cholecystectomy  2010  . Cardiac catheterization    . Dexa  7/05  . Amputation Left 09/04/2012    Procedure: AMPUTATION BELOW KNEE;  Surgeon: Angelia Mould, MD;  Location: [place];  Service: Vascular;  Laterality: Left;  . Abdominal angiogram N/A 08/31/2012    Procedure: ABDOMINAL ANGIOGRAM with runoff poss intervention;  Surgeon: Angelia Mould,  MD;  Location: [place] CATH LAB;  Service: Cardiovascular;  Laterality: N/A;  . Tubal ligation    . Left heart catheterization with coronary angiogram N/A 06/22/2014    Procedure: LEFT HEART CATHETERIZATION WITH CORONARY ANGIOGRAM;  Surgeon: Burnell Blanks, MD;  Location: [facility] CATH LAB;  Service: Cardiovascular;  Laterality: N/A;  . Eye surgery    . Brain surgery      1997 blood clot on the brain, then had to relieve fluid on the brain  . Multiple extractions with alveoloplasty N/A 08/01/2014    Procedure: EXTRACTIONS OF TEETH NUMBERS 7 8 9 10 11  AND 19 AND ALVEOLOPLASTY UPPER LEFT AND RIGHT QUADRANT;  Surgeon: Isac Caddy, DDS;  Location: [place];  Service: Oral Surgery;  Laterality: N/A;  . Cardiac catheterization N/A 03/07/2015    Procedure: Left Heart Cath and Coronary Angiography;  Surgeon: Leonie Man, MD;  Location: [place] CV LAB;   Service: Cardiovascular;  Laterality: N/A;    Social History   Social History  . Marital Status: Married    Spouse Name: N/A  . Number of Children: 7  . Years of Education: N/A   Occupational History  . Retired    Social History Main Topics  . Smoking status: Never Smoker   . Smokeless tobacco: Never Used  . Alcohol Use: No     Comment: rare  . Drug Use: No  . Sexual Activity: No   Other Topics Concern  . Not on file   Social History Narrative    Current Outpatient Prescriptions on File Prior to Visit  Medication Sig Dispense Refill  . acetaminophen (TYLENOL) 500 MG tablet Take 500 mg by mouth every 6 (six) hours as needed for mild pain.    Marland Kitchen albuterol (PROVENTIL) (2.5 MG/3ML) 0.083% nebulizer solution Take 3 mLs (2.5 mg total) by nebulization every 4 (four) hours as needed for wheezing or shortness of breath. 75 mL 12  . allopurinol (ZYLOPRIM) 100 MG tablet TAKE 1 TABLET BY MOUTH DAILY 30 tablet 0  . amLODipine (NORVASC) 10 MG tablet TAKE 1 TABLET BY MOUTH DAILY 30 tablet 0  . aspirin EC 81 MG tablet Take 1 tablet (81 mg total) by mouth daily. 90 tablet 3  . atorvastatin (LIPITOR) 40 MG tablet TAKE 1 TABLET BY MOUTH DAILY AT 6 PM 30 tablet 0  . calcitRIOL (ROCALTROL) 0.25 MCG capsule TAKE ONE CAPSULE BY MOUTH EVERY DAY 30 capsule 0  . clopidogrel (PLAVIX) 75 MG tablet Take 75 mg by mouth daily.    Marland Kitchen docusate sodium (COLACE) 100 MG capsule Take 100 mg by mouth 2 (two) times daily as needed for mild constipation (constipation).     Marland Kitchen donepezil (ARICEPT) 5 MG tablet Take 1 tablet (5 mg total) by mouth at bedtime. 30 tablet 11  . feeding supplement (GLUCERNA SHAKE) LIQD Take 237 mLs by mouth every morning.     . gabapentin (NEURONTIN) 300 MG capsule TAKE ONE CAPSULE BY MOUTH EVERY NIGHT AT BEDTIME 30 capsule 0  . hydrALAZINE (APRESOLINE) 25 MG tablet TAKE 1 TABLET BY MOUTH THREE TIMES DAILY 90 tablet 0  . HYDROcodone-acetaminophen (NORCO) 10-325 MG tablet Take 1 tablet by  mouth every 6 (six) hours as needed for moderate pain.    . isosorbide-hydrALAZINE (BIDIL) 20-37.5 MG tablet Take 1 tablet by mouth 3 (three) times daily. 90 tablet 0  . latanoprost (XALATAN) 0.005 % ophthalmic solution Place 1 drop into both eyes at bedtime.    . nitroGLYCERIN (NITROSTAT) 0.4  MG SL tablet Place 0.4 mg under the tongue every 5 (five) minutes as needed for chest pain.     . ONE TOUCH ULTRA TEST test strip USE TO TEST BLOOD SUGAR TWICE DAILY. 100 each 3  . pantoprazole (PROTONIX) 40 MG tablet TAKE 1 TABLET BY MOUTH TWICE DAILY 60 tablet 0  . potassium chloride SA (K-DUR,KLOR-CON) 10 MEQ tablet Take 1 tablet (10 mEq total) by mouth daily. 30 tablet 3  . ranolazine (RANEXA) 500 MG 12 hr tablet Take 1 tablet (500 mg total) by mouth 2 (two) times daily. 60 tablet 3  . sertraline (ZOLOFT) 100 MG tablet TAKE 1 TABLET BY MOUTH EVERY DAY 30 tablet 0  . ZETIA 10 MG tablet TAKE 1 TABLET BY MOUTH EVERY MORNING 30 tablet 0   No current facility-administered medications on file prior to visit.    Allergies  Allergen Reactions  . Iohexol      Code: RASH, Desc: [place] ON PT'S CHART ALLERGIC TO IV DYE 09/04/07/RM, Onset Date: [id number]     Family History  Problem Relation Age of Onset  . Cancer - Other Mother     "Stomach" Cancer  . Diabetes Mother   . Heart disease Mother   . Stomach cancer Mother   . Hypertension Mother   . Lymphoma Father   . Hypertension Father   . Kidney disease Paternal Grandmother   . Asthma Other   . Diabetes Sister   . Rheum arthritis Mother   . Rheum arthritis Father   . Heart attack Neg Hx   . Stroke Paternal Grandmother     BP 142/84 mmHg  Pulse 78  Temp(Src) 98.5 F (36.9 C) (Oral)  Ht 5\' 6"  (1.676 m)  Wt 205 lb (92.987 kg)  BMI 33.10 kg/m2  SpO2 91%  Review of Systems Denies fever and hypoglycemia    Objective:   Physical Exam VITAL SIGNS:  See vs page GENERAL: no distress.  In wheelchair LUNGS:  Clear to auscultation,  except for some upper-airway wheezing.    A1c=6.7% CXR: mild chf    Assessment & Plan:  DM: overcontrolled: therapy limited by noncompliance with insulin dosing. Acute bronchitis, new. CHF: slightly worse.  Patient is advised the following: Patient Instructions  i have sent prescriptions to your pharmacy, to refill both inhalers, and for an antibiotic pill.   Let's check a chest x-ray today.   Please reduce the insulin 14 units in the morning, and none in the evening.  Please take this amount, no matter what your blood sugar is.   check your blood sugar twice a day.  vary the time of day when you check, between before the 3 meals, and at bedtime.  also check if you have symptoms of your blood sugar being too high or too low.  please keep a record of the readings and bring it to your next appointment here.  You can write it on any piece of paper.  please call us sooner if your blood sugar goes below 70, or if you have a lot of readings over 200.   I hope you feel better soon.  If you don't feel better by next week, please call back.  Please call sooner if you get worse.   Please come back for a follow-up appointment in 3 months.

## 2015-04-19 NOTE — Patient Instructions (Addendum)
i have sent prescriptions to your pharmacy, to refill both inhalers, and for an antibiotic pill.   Let's check a chest x-ray today.   Please reduce the insulin 14 units in the morning, and none in the evening.  Please take this amount, no matter what your blood sugar is.   check your blood sugar twice a day.  vary the time of day when you check, between before the 3 meals, and at bedtime.  also check if you have symptoms of your blood sugar being too high or too low.  please keep a record of the readings and bring it to your next appointment here.  You can write it on any piece of paper.  please call us sooner if your blood sugar goes below 70, or if you have a lot of readings over 200.   I hope you feel better soon.  If you don't feel better by next week, please call back.  Please call sooner if you get worse.   Please come back for a follow-up appointment in 3 months.

## 2015-04-24 ENCOUNTER — Ambulatory Visit: Payer: Medicare Other | Admitting: Endocrinology

## 2015-04-24 ENCOUNTER — Encounter: Payer: Self-pay | Admitting: Endocrinology

## 2015-04-24 ENCOUNTER — Emergency Department (HOSPITAL_COMMUNITY): Payer: Medicare Other

## 2015-04-24 ENCOUNTER — Ambulatory Visit (INDEPENDENT_AMBULATORY_CARE_PROVIDER_SITE_OTHER): Payer: Medicare Other | Admitting: Endocrinology

## 2015-04-24 ENCOUNTER — Encounter (HOSPITAL_COMMUNITY): Payer: Self-pay | Admitting: *Deleted

## 2015-04-24 ENCOUNTER — Inpatient Hospital Stay (HOSPITAL_COMMUNITY)
Admission: EM | Admit: 2015-04-24 | Discharge: 2015-05-15 | DRG: 252 | Disposition: A | Discharge status: Skilled Nursing Facility | Payer: Medicare Other | Attending: Family Medicine | Admitting: Family Medicine

## 2015-04-24 VITALS — BP 132/88 | HR 67 | Temp 98.4°F

## 2015-04-24 DIAGNOSIS — J440 Chronic obstructive pulmonary disease with acute lower respiratory infection: Secondary | ICD-10-CM | POA: Diagnosis present

## 2015-04-24 DIAGNOSIS — E785 Hyperlipidemia, unspecified: Secondary | ICD-10-CM | POA: Diagnosis present

## 2015-04-24 DIAGNOSIS — I132 Hypertensive heart and chronic kidney disease with heart failure and with stage 5 chronic kidney disease, or end stage renal disease: Principal | ICD-10-CM | POA: Diagnosis present

## 2015-04-24 DIAGNOSIS — I5033 Acute on chronic diastolic (congestive) heart failure: Secondary | ICD-10-CM | POA: Diagnosis not present

## 2015-04-24 DIAGNOSIS — J189 Pneumonia, unspecified organism: Secondary | ICD-10-CM | POA: Diagnosis present

## 2015-04-24 DIAGNOSIS — Z794 Long term (current) use of insulin: Secondary | ICD-10-CM | POA: Diagnosis not present

## 2015-04-24 DIAGNOSIS — D649 Anemia, unspecified: Secondary | ICD-10-CM | POA: Diagnosis present

## 2015-04-24 DIAGNOSIS — Z7902 Long term (current) use of antithrombotics/antiplatelets: Secondary | ICD-10-CM

## 2015-04-24 DIAGNOSIS — R918 Other nonspecific abnormal finding of lung field: Secondary | ICD-10-CM

## 2015-04-24 DIAGNOSIS — E1151 Type 2 diabetes mellitus with diabetic peripheral angiopathy without gangrene: Secondary | ICD-10-CM | POA: Diagnosis present

## 2015-04-24 DIAGNOSIS — I4729 Other ventricular tachycardia: Secondary | ICD-10-CM

## 2015-04-24 DIAGNOSIS — G4733 Obstructive sleep apnea (adult) (pediatric): Secondary | ICD-10-CM | POA: Diagnosis present

## 2015-04-24 DIAGNOSIS — Z89512 Acquired absence of left leg below knee: Secondary | ICD-10-CM | POA: Diagnosis not present

## 2015-04-24 DIAGNOSIS — I10 Essential (primary) hypertension: Secondary | ICD-10-CM | POA: Diagnosis present

## 2015-04-24 DIAGNOSIS — E1122 Type 2 diabetes mellitus with diabetic chronic kidney disease: Secondary | ICD-10-CM | POA: Diagnosis present

## 2015-04-24 DIAGNOSIS — E1140 Type 2 diabetes mellitus with diabetic neuropathy, unspecified: Secondary | ICD-10-CM | POA: Diagnosis present

## 2015-04-24 DIAGNOSIS — J45909 Unspecified asthma, uncomplicated: Secondary | ICD-10-CM | POA: Diagnosis present

## 2015-04-24 DIAGNOSIS — I429 Cardiomyopathy, unspecified: Secondary | ICD-10-CM | POA: Diagnosis present

## 2015-04-24 DIAGNOSIS — I472 Ventricular tachycardia: Secondary | ICD-10-CM | POA: Diagnosis not present

## 2015-04-24 DIAGNOSIS — M109 Gout, unspecified: Secondary | ICD-10-CM | POA: Diagnosis present

## 2015-04-24 DIAGNOSIS — L89319 Pressure ulcer of right buttock, unspecified stage: Secondary | ICD-10-CM | POA: Diagnosis present

## 2015-04-24 DIAGNOSIS — R05 Cough: Secondary | ICD-10-CM | POA: Diagnosis present

## 2015-04-24 DIAGNOSIS — Z6832 Body mass index (BMI) 32.0-32.9, adult: Secondary | ICD-10-CM

## 2015-04-24 DIAGNOSIS — L89312 Pressure ulcer of right buttock, stage 2: Secondary | ICD-10-CM | POA: Diagnosis present

## 2015-04-24 DIAGNOSIS — Z9119 Patient's noncompliance with other medical treatment and regimen: Secondary | ICD-10-CM

## 2015-04-24 DIAGNOSIS — N186 End stage renal disease: Secondary | ICD-10-CM | POA: Diagnosis present

## 2015-04-24 DIAGNOSIS — I5042 Chronic combined systolic (congestive) and diastolic (congestive) heart failure: Secondary | ICD-10-CM | POA: Diagnosis not present

## 2015-04-24 DIAGNOSIS — Z8542 Personal history of malignant neoplasm of other parts of uterus: Secondary | ICD-10-CM | POA: Diagnosis not present

## 2015-04-24 DIAGNOSIS — D638 Anemia in other chronic diseases classified elsewhere: Secondary | ICD-10-CM | POA: Diagnosis present

## 2015-04-24 DIAGNOSIS — I248 Other forms of acute ischemic heart disease: Secondary | ICD-10-CM | POA: Diagnosis present

## 2015-04-24 DIAGNOSIS — N170 Acute kidney failure with tubular necrosis: Secondary | ICD-10-CM | POA: Diagnosis present

## 2015-04-24 DIAGNOSIS — Z992 Dependence on renal dialysis: Secondary | ICD-10-CM

## 2015-04-24 DIAGNOSIS — Y95 Nosocomial condition: Secondary | ICD-10-CM | POA: Diagnosis present

## 2015-04-24 DIAGNOSIS — E1165 Type 2 diabetes mellitus with hyperglycemia: Secondary | ICD-10-CM | POA: Diagnosis present

## 2015-04-24 DIAGNOSIS — I2583 Coronary atherosclerosis due to lipid rich plaque: Secondary | ICD-10-CM | POA: Diagnosis present

## 2015-04-24 DIAGNOSIS — N189 Chronic kidney disease, unspecified: Secondary | ICD-10-CM

## 2015-04-24 DIAGNOSIS — N179 Acute kidney failure, unspecified: Secondary | ICD-10-CM

## 2015-04-24 DIAGNOSIS — I5023 Acute on chronic systolic (congestive) heart failure: Secondary | ICD-10-CM | POA: Diagnosis not present

## 2015-04-24 DIAGNOSIS — E11319 Type 2 diabetes mellitus with unspecified diabetic retinopathy without macular edema: Secondary | ICD-10-CM | POA: Diagnosis present

## 2015-04-24 DIAGNOSIS — N183 Chronic kidney disease, stage 3 (moderate): Secondary | ICD-10-CM | POA: Diagnosis not present

## 2015-04-24 DIAGNOSIS — L89322 Pressure ulcer of left buttock, stage 2: Secondary | ICD-10-CM | POA: Diagnosis present

## 2015-04-24 DIAGNOSIS — I509 Heart failure, unspecified: Secondary | ICD-10-CM

## 2015-04-24 DIAGNOSIS — I2510 Atherosclerotic heart disease of native coronary artery without angina pectoris: Secondary | ICD-10-CM | POA: Diagnosis present

## 2015-04-24 DIAGNOSIS — E875 Hyperkalemia: Secondary | ICD-10-CM | POA: Diagnosis not present

## 2015-04-24 DIAGNOSIS — R062 Wheezing: Secondary | ICD-10-CM

## 2015-04-24 DIAGNOSIS — I5043 Acute on chronic combined systolic (congestive) and diastolic (congestive) heart failure: Secondary | ICD-10-CM | POA: Diagnosis present

## 2015-04-24 DIAGNOSIS — N19 Unspecified kidney failure: Secondary | ICD-10-CM

## 2015-04-24 DIAGNOSIS — J9601 Acute respiratory failure with hypoxia: Secondary | ICD-10-CM | POA: Diagnosis present

## 2015-04-24 DIAGNOSIS — Z7982 Long term (current) use of aspirin: Secondary | ICD-10-CM

## 2015-04-24 DIAGNOSIS — E669 Obesity, unspecified: Secondary | ICD-10-CM | POA: Diagnosis present

## 2015-04-24 DIAGNOSIS — J9690 Respiratory failure, unspecified, unspecified whether with hypoxia or hypercapnia: Secondary | ICD-10-CM | POA: Diagnosis present

## 2015-04-24 DIAGNOSIS — E1121 Type 2 diabetes mellitus with diabetic nephropathy: Secondary | ICD-10-CM | POA: Diagnosis not present

## 2015-04-24 HISTORY — DX: Reserved for inherently not codable concepts without codable children: IMO0001

## 2015-04-24 LAB — COMPREHENSIVE METABOLIC PANEL
ALK PHOS: 111 U/L (ref 38–126)
ALT: 32 U/L (ref 14–54)
ANION GAP: 11 (ref 5–15)
AST: 45 U/L — ABNORMAL HIGH (ref 15–41)
Albumin: 3.3 g/dL — ABNORMAL LOW (ref 3.5–5.0)
BILIRUBIN TOTAL: 1 mg/dL (ref 0.3–1.2)
BUN: 24 mg/dL — AB (ref 6–20)
CALCIUM: 8.8 mg/dL — AB (ref 8.9–10.3)
CO2: 26 mmol/L (ref 22–32)
CREATININE: 2.05 mg/dL — AB (ref 0.44–1.00)
Chloride: 104 mmol/L (ref 101–111)
GFR calc Af Amer: 25 mL/min — ABNORMAL LOW (ref >60)
GFR calc non Af Amer: 21 mL/min — ABNORMAL LOW (ref >60)
GLUCOSE: 87 mg/dL (ref 65–99)
Potassium: 4.4 mmol/L (ref 3.5–5.1)
Sodium: 141 mmol/L (ref 135–145)
TOTAL PROTEIN: 6.3 g/dL — AB (ref 6.5–8.1)

## 2015-04-24 LAB — CBC WITH DIFFERENTIAL/PLATELET
BASOS ABS: 0 10*3/uL (ref 0.0–0.1)
BASOS PCT: 0 %
EOS ABS: 0.1 10*3/uL (ref 0.0–0.7)
EOS PCT: 2 %
HCT: 29.2 % — ABNORMAL LOW (ref 36.0–46.0)
Hemoglobin: 10.5 g/dL — ABNORMAL LOW (ref 12.0–15.0)
Lymphocytes Relative: 13 %
Lymphs Abs: 0.9 10*3/uL (ref 0.7–4.0)
MCH: 33.1 pg (ref 26.0–34.0)
MCHC: 36 g/dL (ref 30.0–36.0)
MCV: 92.1 fL (ref 78.0–100.0)
MONO ABS: 0.8 10*3/uL (ref 0.1–1.0)
Monocytes Relative: 12 %
NEUTROS ABS: 5 10*3/uL (ref 1.7–7.7)
Neutrophils Relative %: 73 %
PLATELETS: 365 10*3/uL (ref 150–400)
RBC: 3.17 MIL/uL — ABNORMAL LOW (ref 3.87–5.11)
RDW: 20.6 % — AB (ref 11.5–15.5)
WBC: 6.8 10*3/uL (ref 4.0–10.5)

## 2015-04-24 LAB — PROCALCITONIN: Procalcitonin: <0.1 ng/mL

## 2015-04-24 LAB — TROPONIN I: TROPONIN I: 0.17 ng/mL — AB (ref <0.031)

## 2015-04-24 LAB — BRAIN NATRIURETIC PEPTIDE: B NATRIURETIC PEPTIDE 5: 1594.7 pg/mL — AB (ref 0.0–100.0)

## 2015-04-24 LAB — I-STAT CG4 LACTIC ACID, ED: LACTIC ACID, VENOUS: 1.14 mmol/L (ref 0.5–2.0)

## 2015-04-24 LAB — GLUCOSE, CAPILLARY: Glucose-Capillary: 153 mg/dL — ABNORMAL HIGH (ref 65–99)

## 2015-04-24 MED ORDER — FUROSEMIDE 10 MG/ML IJ SOLN: 40.0000 mg | Freq: Every day | INTRAMUSCULAR | Status: DC

## 2015-04-24 MED ORDER — IPRATROPIUM BROMIDE 0.02 % IN SOLN: 0.5000 mg | Freq: Four times a day (QID) | RESPIRATORY_TRACT | Status: DC

## 2015-04-24 MED ORDER — ENOXAPARIN SODIUM 40 MG/0.4ML ~~LOC~~ SOLN
40.0000 mg | Freq: Every day | SUBCUTANEOUS | Status: DC
  Administered 2015-04-25: 40 mg via SUBCUTANEOUS
  Filled 2015-04-24: qty 0.4

## 2015-04-24 MED ORDER — CLOPIDOGREL BISULFATE 75 MG PO TABS
75.0000 mg | ORAL_TABLET | Freq: Every day | ORAL | Status: DC
  Administered 2015-04-25 – 2015-05-01 (×7): 75 mg via ORAL
  Filled 2015-04-24 (×7): qty 1

## 2015-04-24 MED ORDER — ALBUTEROL SULFATE (2.5 MG/3ML) 0.083% IN NEBU: 2.5000 mg | INHALATION_SOLUTION | Freq: Four times a day (QID) | RESPIRATORY_TRACT | Status: DC

## 2015-04-24 MED ORDER — INSULIN ASPART PROT & ASPART (70-30 MIX) 100 UNIT/ML ~~LOC~~ SUSP
14.0000 [IU] | Freq: Every day | SUBCUTANEOUS | Status: DC
  Administered 2015-04-25 – 2015-04-28 (×4): 14 [IU] via SUBCUTANEOUS
  Filled 2015-04-24: qty 10

## 2015-04-24 MED ORDER — VANCOMYCIN HCL IN DEXTROSE 1-5 GM/200ML-% IV SOLN: 1000.0000 mg | Freq: Once | INTRAVENOUS | Status: DC

## 2015-04-24 MED ORDER — EZETIMIBE 10 MG PO TABS
10.0000 mg | ORAL_TABLET | Freq: Every morning | ORAL | Status: DC
  Administered 2015-04-25 – 2015-05-08 (×14): 10 mg via ORAL
  Filled 2015-04-24 (×15): qty 1

## 2015-04-24 MED ORDER — DEXTROSE 5 % IV SOLN
2.0000 g | INTRAVENOUS | Status: DC
  Administered 2015-04-24 – 2015-04-25 (×2): 2 g via INTRAVENOUS
  Filled 2015-04-24 (×3): qty 2

## 2015-04-24 MED ORDER — ATORVASTATIN CALCIUM 40 MG PO TABS
40.0000 mg | ORAL_TABLET | Freq: Every day | ORAL | Status: DC
  Administered 2015-04-25 – 2015-05-14 (×20): 40 mg via ORAL
  Filled 2015-04-24 (×20): qty 1

## 2015-04-24 MED ORDER — ASPIRIN 81 MG PO CHEW
324.0000 mg | CHEWABLE_TABLET | Freq: Once | ORAL | Status: AC
  Administered 2015-04-24: 243 mg via ORAL
  Filled 2015-04-24: qty 4

## 2015-04-24 MED ORDER — GABAPENTIN 300 MG PO CAPS
300.0000 mg | ORAL_CAPSULE | Freq: Every day | ORAL | Status: DC
  Administered 2015-04-25 – 2015-05-14 (×21): 300 mg via ORAL
  Filled 2015-04-24 (×21): qty 1

## 2015-04-24 MED ORDER — POTASSIUM CHLORIDE CRYS ER 10 MEQ PO TBCR
10.0000 meq | EXTENDED_RELEASE_TABLET | Freq: Every day | ORAL | Status: DC
  Administered 2015-04-25 – 2015-04-26 (×2): 10 meq via ORAL
  Filled 2015-04-24 (×2): qty 1

## 2015-04-24 MED ORDER — ONDANSETRON HCL 4 MG PO TABS: 4.0000 mg | ORAL_TABLET | Freq: Four times a day (QID) | ORAL | Status: DC | PRN

## 2015-04-24 MED ORDER — DEXTROSE 5 % IV SOLN: 2.0000 g | Freq: Three times a day (TID) | INTRAVENOUS | Status: DC

## 2015-04-24 MED ORDER — SODIUM CHLORIDE 0.9 % IJ SOLN
3.0000 mL | Freq: Two times a day (BID) | INTRAMUSCULAR | Status: DC
  Administered 2015-04-25 – 2015-05-15 (×38): 3 mL via INTRAVENOUS

## 2015-04-24 MED ORDER — ALLOPURINOL 100 MG PO TABS
100.0000 mg | ORAL_TABLET | Freq: Every day | ORAL | Status: DC
Start: 2015-04-25 — End: 2015-05-15
  Administered 2015-04-25 – 2015-05-15 (×20): 100 mg via ORAL
  Filled 2015-04-24 (×21): qty 1

## 2015-04-24 MED ORDER — AMLODIPINE BESYLATE 10 MG PO TABS
10.0000 mg | ORAL_TABLET | Freq: Every day | ORAL | Status: DC
  Administered 2015-04-25 – 2015-04-29 (×5): 10 mg via ORAL
  Filled 2015-04-24 (×5): qty 1

## 2015-04-24 MED ORDER — NITROGLYCERIN 0.4 MG SL SUBL: 0.4000 mg | SUBLINGUAL_TABLET | SUBLINGUAL | Status: DC | PRN

## 2015-04-24 MED ORDER — RANOLAZINE ER 500 MG PO TB12
500.0000 mg | ORAL_TABLET | Freq: Two times a day (BID) | ORAL | Status: DC
  Administered 2015-04-25 – 2015-05-15 (×41): 500 mg via ORAL
  Filled 2015-04-24 (×41): qty 1

## 2015-04-24 MED ORDER — ALBUTEROL SULFATE (2.5 MG/3ML) 0.083% IN NEBU: 2.5000 mg | INHALATION_SOLUTION | RESPIRATORY_TRACT | Status: DC | PRN

## 2015-04-24 MED ORDER — DONEPEZIL HCL 5 MG PO TABS
5.0000 mg | ORAL_TABLET | Freq: Every day | ORAL | Status: DC
  Administered 2015-04-25 – 2015-04-28 (×5): 5 mg via ORAL
  Filled 2015-04-24 (×5): qty 1

## 2015-04-24 MED ORDER — SERTRALINE HCL 100 MG PO TABS
100.0000 mg | ORAL_TABLET | Freq: Every day | ORAL | Status: DC
  Administered 2015-04-25 – 2015-05-15 (×20): 100 mg via ORAL
  Filled 2015-04-24 (×22): qty 1
  Filled 2015-04-24: qty 2
  Filled 2015-04-24: qty 1

## 2015-04-24 MED ORDER — CALCITRIOL 0.25 MCG PO CAPS
0.2500 ug | ORAL_CAPSULE | Freq: Every day | ORAL | Status: DC
  Administered 2015-04-25 – 2015-05-08 (×14): 0.25 ug via ORAL
  Administered 2015-05-09: 0.5 ug via ORAL
  Administered 2015-05-11 – 2015-05-15 (×5): 0.25 ug via ORAL
  Filled 2015-04-24 (×20): qty 1

## 2015-04-24 MED ORDER — VANCOMYCIN HCL IN DEXTROSE 1-5 GM/200ML-% IV SOLN
1000.0000 mg | INTRAVENOUS | Status: DC
  Administered 2015-04-24 – 2015-04-25 (×2): 1000 mg via INTRAVENOUS
  Filled 2015-04-24 (×3): qty 200

## 2015-04-24 MED ORDER — MOMETASONE FURO-FORMOTEROL FUM 100-5 MCG/ACT IN AERO
2.0000 | INHALATION_SPRAY | Freq: Two times a day (BID) | RESPIRATORY_TRACT | Status: DC
  Administered 2015-04-25 – 2015-05-15 (×35): 2 via RESPIRATORY_TRACT
  Filled 2015-04-24: qty 8.8

## 2015-04-24 MED ORDER — ISOSORBIDE MONONITRATE ER 60 MG PO TB24
60.0000 mg | ORAL_TABLET | Freq: Every day | ORAL | Status: DC
  Administered 2015-04-25 – 2015-05-11 (×15): 60 mg via ORAL
  Filled 2015-04-24 (×17): qty 1

## 2015-04-24 MED ORDER — HYDRALAZINE HCL 25 MG PO TABS
25.0000 mg | ORAL_TABLET | Freq: Three times a day (TID) | ORAL | Status: DC
  Administered 2015-04-25 (×2): 25 mg via ORAL
  Filled 2015-04-24 (×2): qty 1

## 2015-04-24 MED ORDER — FUROSEMIDE 10 MG/ML IJ SOLN
80.0000 mg | Freq: Once | INTRAMUSCULAR | Status: AC
  Administered 2015-04-24: 80 mg via INTRAVENOUS
  Filled 2015-04-24 (×2): qty 8

## 2015-04-24 MED ORDER — ACETAMINOPHEN 325 MG PO TABS: 650.0000 mg | ORAL_TABLET | Freq: Four times a day (QID) | ORAL | Status: DC | PRN

## 2015-04-24 MED ORDER — LATANOPROST 0.005 % OP SOLN
1.0000 [drp] | Freq: Every day | OPHTHALMIC | Status: DC
  Administered 2015-04-25 – 2015-05-14 (×21): 1 [drp] via OPHTHALMIC
  Filled 2015-04-24: qty 2.5

## 2015-04-24 MED ORDER — HYDROCODONE-ACETAMINOPHEN 10-325 MG PO TABS
1.0000 | ORAL_TABLET | Freq: Four times a day (QID) | ORAL | Status: DC | PRN
  Administered 2015-04-26 – 2015-05-06 (×3): 1 via ORAL
  Filled 2015-04-24 (×3): qty 1

## 2015-04-24 MED ORDER — GLUCERNA SHAKE PO LIQD
237.0000 mL | Freq: Every morning | ORAL | Status: DC
  Administered 2015-04-25 – 2015-05-04 (×9): 237 mL via ORAL

## 2015-04-24 MED ORDER — DEXTROSE 5 % IV SOLN: 2.0000 g | Freq: Once | INTRAVENOUS | Status: DC

## 2015-04-24 MED ORDER — ACETAMINOPHEN 650 MG RE SUPP: 650.0000 mg | Freq: Four times a day (QID) | RECTAL | Status: DC | PRN

## 2015-04-24 MED ORDER — ONDANSETRON HCL 4 MG/2ML IJ SOLN: 4.0000 mg | Freq: Four times a day (QID) | INTRAMUSCULAR | Status: DC | PRN

## 2015-04-24 MED ORDER — PANTOPRAZOLE SODIUM 40 MG PO TBEC
40.0000 mg | DELAYED_RELEASE_TABLET | Freq: Two times a day (BID) | ORAL | Status: DC
  Administered 2015-04-25 – 2015-05-15 (×40): 40 mg via ORAL
  Filled 2015-04-24 (×40): qty 1

## 2015-04-24 MED ORDER — ASPIRIN EC 81 MG PO TBEC
81.0000 mg | DELAYED_RELEASE_TABLET | Freq: Every day | ORAL | Status: DC
  Administered 2015-04-25 – 2015-05-15 (×19): 81 mg via ORAL
  Filled 2015-04-24 (×20): qty 1

## 2015-04-24 NOTE — ED Notes (Signed)
RN attempt to call report to floor; RN to call back  

## 2015-04-24 NOTE — Progress Notes (Signed)
ANTIBIOTIC CONSULT NOTE - INITIAL  Pharmacy Consult for Ceftazidime and Vancomycin Indication: rule out pneumonia  Allergies  Allergen Reactions  . Iohexol Itching and Rash     Code: RASH, Desc: [place] ON PT'S CHART ALLERGIC TO IV DYE 09/04/07/RM, Onset Date: [id number]     Patient Measurements: Weight: 197 lb 11.2 oz (89.676 kg) Adjusted Body Weight:   Vital Signs: Temp: 97.8 F (36.6 C) (11/07 1652) Temp Source: Oral (11/07 1652) BP: 119/105 mmHg (11/07 1945) Pulse Rate: 58 (11/07 1652) Intake/Output from previous day:   Intake/Output from this shift:    Labs:  Recent Labs  04/24/15 1738  WBC 6.8  HGB 10.5*  PLT 365  CREATININE 2.05*   Estimated Creatinine Clearance: 23.5 mL/min (by C-G formula based on Cr of 2.05). No results for input(s): VANCOTROUGH, VANCOPEAK, VANCORANDOM, GENTTROUGH, GENTPEAK, GENTRANDOM, TOBRATROUGH, TOBRAPEAK, TOBRARND, AMIKACINPEAK, AMIKACINTROU, AMIKACIN in the last 72 hours.   Microbiology: No results found for this or any previous visit (from the past 720 hour(s)).  Medical History: Past Medical History  Diagnosis Date  . Uterine cancer ([place])   . Diabetes mellitus     type II; peripheral neuropathy  . Hypertension   . GERD (gastroesophageal reflux disease)   . Peripheral vascular disease ([place])     a. s/p L BKA 08/2012.  . Obesity   . Dyslipidemia   . Chronic combined systolic and diastolic CHF (congestive heart failure) (H[facility])     a. EF 50-55%, mild LVH and grade 1 diast. Dysfxn b. Grade 3 Diastolic Dysfunction 23/5573. b. 02/2015: EF 45-50% by cath.  . Osteoarthritis cervical spine   . DM retinopathy ([place])   . Sleep apnea     with CPAP  . History of shingles   . COPD (chronic obstructive pulmonary disease) ([place])   . Depression   . Peptic ulcer disease     duodenal  . Peptic stricture of esophagus   . Hiatal hernia   . Diverticulosis   . Arthritis   . CAD (coronary artery disease)     a. s/p multiple caths with  nonobs CAD;   b. cath 1/10: pLAD 20%, mLAD 40%, pCFX 20%, mCFX 40%, pRCA 60-70%;   c.  Myoview 06/02/12: Low anterior wall scar, no ischemia, EF 37%. d. cath 06/20/2014 70% mid LCx, medical therapy, high risk for PCI due to close proximity to large OM e. cath 03/07/15 pro RCA 50% & pro to mid Cx 75%, both stable, LVEDP 33-12mm Hg-> medical management  . Cardiomyopathy ([place])     a. Echo 05/31/12: EF 40-45%. b. Echo 06/2014: EF 50-55%. c. Cath 02/2015: EF 45-50%.  . Asthma     Mild  . Pruritic condition     Idiopathic  . Zoster   . Noncompliance   . CKD (chronic kidney disease), stage III     Dr. Lorrene Reid  . Bradycardia     a. Not on BB due to this.    Medications:   (Not in a hospital admission) Scheduled:  . aspirin  324 mg Oral Once  . furosemide  80 mg Intravenous Once   Infusions:  . cefTAZidime (FORTAZ)  IV    . vancomycin     Assessment: 81yo female with history of DM2, HTN, GERD, HLD, CAD and CHF presents with worsening cough. Pharmacy is consulted to dose ceftazidime and vancomycin for suspected HCAP. Pt is afebrile, WBC 6.8, sCr 2.05.  Goal of Therapy:  Vancomycin trough level 15-20 mcg/ml  Plan:  Vancomycin  1g IV q24h Ceftazidime 2g IV q24h Expected duration 7 days with resolution of temperature and/or normalization of WBC Measure antibiotic drug levels at steady state Follow up culture results, renal function and clinical course  Andrey Cota. Diona Foley, PharmD Clinical Pharmacist Pager [phone] 04/24/2015,8:26 PM

## 2015-04-24 NOTE — ED Notes (Signed)
MD at bedside. 

## 2015-04-24 NOTE — Progress Notes (Signed)
Subjective:    Patient ID: [name], female    DOB: 11-06-1929, 81 y.o.   MRN: [id number]  HPI Pt was seen here last week for acute bronchitis and CHF.  She was rx'ed with antibiotics, MDI's, and increased lasix.  I asked husband, who says since last week, she is slightly worse overall.  Specifically, he says the sob in the chest is slightly worse, and she has assoc cough (prod of green mucus).  She says she has finished the antibiotic, and takes the increased dosage of lasix.   Past Medical History  Diagnosis Date  . Uterine cancer ([place])   . Diabetes mellitus     type II; peripheral neuropathy  . Hypertension   . GERD (gastroesophageal reflux disease)   . Peripheral vascular disease ([place])     a. s/p L BKA 08/2012.  . Obesity   . Dyslipidemia   . Chronic combined systolic and diastolic CHF (congestive heart failure) (HCC)     a. EF 50-55%, mild LVH and grade 1 diast. Dysfxn b. Grade 3 Diastolic Dysfunction 69/4854. b. 02/2015: EF 45-50% by cath.  . Osteoarthritis cervical spine   . DM retinopathy ([place])   . Sleep apnea     with CPAP  . History of shingles   . COPD (chronic obstructive pulmonary disease) ([place])   . Depression   . Peptic ulcer disease     duodenal  . Peptic stricture of esophagus   . Hiatal hernia   . Diverticulosis   . Arthritis   . CAD (coronary artery disease)     a. s/p multiple caths with nonobs CAD;   b. cath 1/10: pLAD 20%, mLAD 40%, pCFX 20%, mCFX 40%, pRCA 60-70%;   c.  Myoview 06/02/12: Low anterior wall scar, no ischemia, EF 37%. d. cath 06/20/2014 70% mid LCx, medical therapy, high risk for PCI due to close proximity to large OM e. cath 03/07/15 pro RCA 50% & pro to mid Cx 75%, both stable, LVEDP 33-108mm Hg-> medical management  . Cardiomyopathy ([place])     a. Echo 05/31/12: EF 40-45%. b. Echo 06/2014: EF 50-55%. c. Cath 02/2015: EF 45-50%.  . Asthma     Mild  . Pruritic condition     Idiopathic  . Zoster   . Noncompliance   . CKD (chronic  kidney disease), stage III     Dr. Lorrene Reid  . Bradycardia     a. Not on BB due to this.    Past Surgical History  Procedure Laterality Date  . Tubal ligation  1967  . Knee arthroscopy  10/1998    Left  . Craniotomy  1997    Left for SDH  . Cataract extraction, bilateral  2005  . Hernia repair    . Esophagogastroduodenoscopy  04/04/2004  . Spine surgery      C-spine and lumbar surgery  . Cholecystectomy  2010  . Cardiac catheterization    . Dexa  7/05  . Amputation Left 09/04/2012    Procedure: AMPUTATION BELOW KNEE;  Surgeon: Angelia Mould, MD;  Location: [place];  Service: Vascular;  Laterality: Left;  . Abdominal angiogram N/A 08/31/2012    Procedure: ABDOMINAL ANGIOGRAM with runoff poss intervention;  Surgeon: Angelia Mould, MD;  Location: [facility] CATH LAB;  Service: Cardiovascular;  Laterality: N/A;  . Tubal ligation    . Left heart catheterization with coronary angiogram N/A 06/22/2014    Procedure: LEFT HEART CATHETERIZATION WITH CORONARY ANGIOGRAM;  Surgeon: Annita Brod  Angelena Form, MD;  Location: [place] CATH LAB;  Service: Cardiovascular;  Laterality: N/A;  . Eye surgery    . Brain surgery      1997 blood clot on the brain, then had to relieve fluid on the brain  . Multiple extractions with alveoloplasty N/A 08/01/2014    Procedure: EXTRACTIONS OF TEETH NUMBERS 7 8 9 10 11  AND 19 AND ALVEOLOPLASTY UPPER LEFT AND RIGHT QUADRANT;  Surgeon: Isac Caddy, DDS;  Location: [place];  Service: Oral Surgery;  Laterality: N/A;  . Cardiac catheterization N/A 03/07/2015    Procedure: Left Heart Cath and Coronary Angiography;  Surgeon: Leonie Man, MD;  Location: [place] CV LAB;  Service: Cardiovascular;  Laterality: N/A;    Social History   Social History  . Marital Status: Married    Spouse Name: N/A  . Number of Children: 7  . Years of Education: N/A   Occupational History  . Retired    Social History Main Topics  . Smoking status: Never Smoker   . Smokeless  tobacco: Never Used  . Alcohol Use: No     Comment: rare  . Drug Use: No  . Sexual Activity: No   Other Topics Concern  . Not on file   Social History Narrative    Current Outpatient Prescriptions on File Prior to Visit  Medication Sig Dispense Refill  . acetaminophen (TYLENOL) 500 MG tablet Take 500 mg by mouth every 6 (six) hours as needed for mild pain.    Marland Kitchen albuterol (PROVENTIL HFA;VENTOLIN HFA) 108 (90 BASE) MCG/ACT inhaler Inhale 2 puffs into the lungs every 4 (four) hours as needed for wheezing or shortness of breath. 1 Inhaler 3  . albuterol (PROVENTIL) (2.5 MG/3ML) 0.083% nebulizer solution Take 3 mLs (2.5 mg total) by nebulization every 4 (four) hours as needed for wheezing or shortness of breath. 75 mL 12  . allopurinol (ZYLOPRIM) 100 MG tablet TAKE 1 TABLET BY MOUTH DAILY 30 tablet 0  . amLODipine (NORVASC) 10 MG tablet TAKE 1 TABLET BY MOUTH DAILY 30 tablet 0  . aspirin EC 81 MG tablet Take 1 tablet (81 mg total) by mouth daily. 90 tablet 3  . atorvastatin (LIPITOR) 40 MG tablet TAKE 1 TABLET BY MOUTH DAILY AT 6 PM 30 tablet 0  . azithromycin (ZITHROMAX) 500 MG tablet Take 1 tablet (500 mg total) by mouth daily. 5 tablet 0  . calcitRIOL (ROCALTROL) 0.25 MCG capsule TAKE ONE CAPSULE BY MOUTH EVERY DAY 30 capsule 0  . clopidogrel (PLAVIX) 75 MG tablet Take 75 mg by mouth daily.    Marland Kitchen docusate sodium (COLACE) 100 MG capsule Take 100 mg by mouth 2 (two) times daily as needed for mild constipation (constipation).     Marland Kitchen donepezil (ARICEPT) 5 MG tablet Take 1 tablet (5 mg total) by mouth at bedtime. 30 tablet 11  . feeding supplement (GLUCERNA SHAKE) LIQD Take 237 mLs by mouth every morning.     . Fluticasone-Salmeterol (ADVAIR DISKUS) 100-50 MCG/DOSE AEPB Inhale 1 puff into the lungs 2 (two) times daily. 1 each 11  . furosemide (LASIX) 40 MG tablet Take 1 tablet (40 mg total) by mouth daily. 30 tablet 11  . gabapentin (NEURONTIN) 300 MG capsule TAKE ONE CAPSULE BY MOUTH EVERY NIGHT  AT BEDTIME 30 capsule 0  . hydrALAZINE (APRESOLINE) 25 MG tablet TAKE 1 TABLET BY MOUTH THREE TIMES DAILY 90 tablet 0  . HYDROcodone-acetaminophen (NORCO) 10-325 MG tablet Take 1 tablet by mouth every 6 (six) hours  as needed for moderate pain.    Marland Kitchen insulin NPH-regular Human (NOVOLIN 70/30) (70-30) 100 UNIT/ML injection Inject 14 Units into the skin daily with breakfast. Sliding scale. Only takes in the morning 10 mL 11  . isosorbide-hydrALAZINE (BIDIL) 20-37.5 MG tablet Take 1 tablet by mouth 3 (three) times daily. 90 tablet 0  . latanoprost (XALATAN) 0.005 % ophthalmic solution Place 1 drop into both eyes at bedtime.    . nitroGLYCERIN (NITROSTAT) 0.4 MG SL tablet Place 0.4 mg under the tongue every 5 (five) minutes as needed for chest pain.     . ONE TOUCH ULTRA TEST test strip USE TO TEST BLOOD SUGAR TWICE DAILY. 100 each 3  . pantoprazole (PROTONIX) 40 MG tablet TAKE 1 TABLET BY MOUTH TWICE DAILY 60 tablet 0  . potassium chloride SA (K-DUR,KLOR-CON) 10 MEQ tablet Take 1 tablet (10 mEq total) by mouth daily. 30 tablet 3  . ranolazine (RANEXA) 500 MG 12 hr tablet Take 1 tablet (500 mg total) by mouth 2 (two) times daily. 60 tablet 3  . sertraline (ZOLOFT) 100 MG tablet TAKE 1 TABLET BY MOUTH EVERY DAY 30 tablet 0  . ZETIA 10 MG tablet TAKE 1 TABLET BY MOUTH EVERY MORNING 30 tablet 0   No current facility-administered medications on file prior to visit.    Allergies  Allergen Reactions  . Iohexol      Code: RASH, Desc: [place] ON PT'S CHART ALLERGIC TO IV DYE 09/04/07/RM, Onset Date: [id number]     Family History  Problem Relation Age of Onset  . Cancer - Other Mother     "Stomach" Cancer  . Diabetes Mother   . Heart disease Mother   . Stomach cancer Mother   . Hypertension Mother   . Lymphoma Father   . Hypertension Father   . Kidney disease Paternal Grandmother   . Asthma Other   . Diabetes Sister   . Rheum arthritis Mother   . Rheum arthritis Father   . Heart attack  Neg Hx   . Stroke Paternal Grandmother     BP 132/88 mmHg  Pulse 67  Temp(Src) 98.4 F (36.9 C) (Oral)  SpO2 90%  Review of Systems Denies fever.  Leg edema is the same.      Objective:   Physical Exam VITAL SIGNS:  See vs page GENERAL: no distress LUNGS:  Clear to auscultation, except for rales at the bases.    CXR (last week) mild CHF    Assessment & Plan:  Acute bronchitis and CHF, worse.  We discussed continuing outpatient therapy vs hospitalization.  Pt and husband choose going to ER, where hospitalization would be considered.  I spoke to nurse at ER.  Pt will go by ambulance.

## 2015-04-24 NOTE — ED Notes (Signed)
2nd attempt to call report to floor; RN to call back

## 2015-04-24 NOTE — H&P (Addendum)
[facility] History and Physical  [name] LOV:[id number] DOB: 11/17/1929 DOA: 04/24/2015  Referring physician: Dr. Regenia Skeeter. PCP: Renato Shin, MD  Specialists: Dr. Meda Coffee. Cardiologist.  Chief Complaint: Shortness of breath.  HPI: [name] is a 81 y.o. female with history of nonobstructive CAD who has been recently admitted in September 2016 for unstable angina and at that time patient had unremarkable cardiac cath and patient had medications adjusted presents to the ER as patient has been having increasing shortness of breath over the last 1 week. Patient also has been having some productive cough with greenish sputum. Patient was placed on azithromycin by patient's primary care physician last week with increasing dose of Lasix from 40 mg daily to twice daily. Despite this patient has been getting short of breath even at rest. Denies any chest pain. On exam in the ER patient was mildly short of breath and has mildly elevated JVD and lower extremity edema. Patient also has been having persistent productive cough. Patient's troponin is mildly elevated. Chest x-ray shows infiltrates versus edema. BNP was elevated. Denies any nausea vomiting abdominal pain or diarrhea. Patient has been admitted for acute respiratory failure from CHF versus pneumonia.  Review of Systems: As presented in the history of presenting illness, rest negative.  Past Medical History  Diagnosis Date  . Uterine cancer ([place])   . Diabetes mellitus     type II; peripheral neuropathy  . Hypertension   . GERD (gastroesophageal reflux disease)   . Peripheral vascular disease ([place])     a. s/p L BKA 08/2012.  . Obesity   . Dyslipidemia   . Chronic combined systolic and diastolic CHF (congestive heart failure) (HCC)     a. EF 50-55%, mild LVH and grade 1 diast. Dysfxn b. Grade 3 Diastolic Dysfunction 88/4166. b. 02/2015: EF 45-50% by cath.  . Osteoarthritis cervical spine   . DM retinopathy ([place])    . Sleep apnea     with CPAP  . History of shingles   . COPD (chronic obstructive pulmonary disease) ([place])   . Depression   . Peptic ulcer disease     duodenal  . Peptic stricture of esophagus   . Hiatal hernia   . Diverticulosis   . Arthritis   . CAD (coronary artery disease)     a. s/p multiple caths with nonobs CAD;   b. cath 1/10: pLAD 20%, mLAD 40%, pCFX 20%, mCFX 40%, pRCA 60-70%;   c.  Myoview 06/02/12: Low anterior wall scar, no ischemia, EF 37%. d. cath 06/20/2014 70% mid LCx, medical therapy, high risk for PCI due to close proximity to large OM e. cath 03/07/15 pro RCA 50% & pro to mid Cx 75%, both stable, LVEDP 33-60mm Hg-> medical management  . Cardiomyopathy ([place])     a. Echo 05/31/12: EF 40-45%. b. Echo 06/2014: EF 50-55%. c. Cath 02/2015: EF 45-50%.  . Asthma     Mild  . Pruritic condition     Idiopathic  . Zoster   . Noncompliance   . CKD (chronic kidney disease), stage III     Dr. Lorrene Reid  . Bradycardia     a. Not on BB due to this.   Past Surgical History  Procedure Laterality Date  . Tubal ligation  1967  . Knee arthroscopy  10/1998    Left  . Craniotomy  1997    Left for SDH  . Cataract extraction, bilateral  2005  . Hernia repair    . Esophagogastroduodenoscopy  04/04/2004  . Spine surgery      C-spine and lumbar surgery  . Cholecystectomy  2010  . Cardiac catheterization    . Dexa  7/05  . Amputation Left 09/04/2012    Procedure: AMPUTATION BELOW KNEE;  Surgeon: Angelia Mould, MD;  Location: [facility];  Service: Vascular;  Laterality: Left;  . Abdominal angiogram N/A 08/31/2012    Procedure: ABDOMINAL ANGIOGRAM with runoff poss intervention;  Surgeon: Angelia Mould, MD;  Location: [facility] CATH LAB;  Service: Cardiovascular;  Laterality: N/A;  . Tubal ligation    . Left heart catheterization with coronary angiogram N/A 06/22/2014    Procedure: LEFT HEART CATHETERIZATION WITH CORONARY ANGIOGRAM;  Surgeon: Burnell Blanks, MD;  Location: [facility] CATH  LAB;  Service: Cardiovascular;  Laterality: N/A;  . Eye surgery    . Brain surgery      1997 blood clot on the brain, then had to relieve fluid on the brain  . Multiple extractions with alveoloplasty N/A 08/01/2014    Procedure: EXTRACTIONS OF TEETH NUMBERS 7 8 9 10 11  AND 19 AND ALVEOLOPLASTY UPPER LEFT AND RIGHT QUADRANT;  Surgeon: Isac Caddy, DDS;  Location: [place];  Service: Oral Surgery;  Laterality: N/A;  . Cardiac catheterization N/A 03/07/2015    Procedure: Left Heart Cath and Coronary Angiography;  Surgeon: Leonie Man, MD;  Location: [place] CV LAB;  Service: Cardiovascular;  Laterality: N/A;   Social History:  reports that she has never smoked. She has never used smokeless tobacco. She reports that she does not drink alcohol or use illicit drugs. Where does patient live home. Can patient participate in ADLs? Yes.  Allergies  Allergen Reactions  . Iohexol Itching and Rash     Code: RASH, Desc: [place] ON PT'S CHART ALLERGIC TO IV DYE 09/04/07/RM, Onset Date: [id number]     Family History:  Family History  Problem Relation Age of Onset  . Cancer - Other Mother     "Stomach" Cancer  . Diabetes Mother   . Heart disease Mother   . Stomach cancer Mother   . Hypertension Mother   . Lymphoma Father   . Hypertension Father   . Kidney disease Paternal Grandmother   . Asthma Other   . Diabetes Sister   . Rheum arthritis Mother   . Rheum arthritis Father   . Heart attack Neg Hx   . Stroke Paternal Grandmother       Prior to Admission medications   Medication Sig Start Date End Date Taking? Authorizing Provider  acetaminophen (TYLENOL) 500 MG tablet Take 500 mg by mouth every 6 (six) hours as needed for mild pain.   Yes Historical Provider, MD  albuterol (PROVENTIL HFA;VENTOLIN HFA) 108 (90 BASE) MCG/ACT inhaler Inhale 2 puffs into the lungs every 4 (four) hours as needed for wheezing or shortness of breath. 04/19/15  Yes Renato Shin, MD  albuterol  (PROVENTIL) (2.5 MG/3ML) 0.083% nebulizer solution Take 3 mLs (2.5 mg total) by nebulization every 4 (four) hours as needed for wheezing or shortness of breath. 12/08/13  Yes Robbie Lis, MD  allopurinol (ZYLOPRIM) 100 MG tablet TAKE 1 TABLET BY MOUTH DAILY 07/25/14  Yes Renato Shin, MD  amLODipine (NORVASC) 10 MG tablet TAKE 1 TABLET BY MOUTH DAILY 03/31/15  Yes Renato Shin, MD  aspirin EC 81 MG tablet Take 1 tablet (81 mg total) by mouth daily. 03/16/15  Yes Dayna N Dunn, PA-C  atorvastatin (LIPITOR) 40 MG tablet TAKE 1 TABLET  BY MOUTH DAILY AT 6 PM 03/31/15  Yes Renato Shin, MD  calcitRIOL (ROCALTROL) 0.25 MCG capsule TAKE ONE CAPSULE BY MOUTH EVERY DAY 04/29/14  Yes Renato Shin, MD  clopidogrel (PLAVIX) 75 MG tablet Take 75 mg by mouth daily.   Yes Historical Provider, MD  donepezil (ARICEPT) 5 MG tablet Take 1 tablet (5 mg total) by mouth at bedtime. 05/19/14  Yes Renato Shin, MD  feeding supplement (GLUCERNA SHAKE) LIQD Take 237 mLs by mouth every morning.    Yes Historical Provider, MD  Fluticasone-Salmeterol (ADVAIR DISKUS) 100-50 MCG/DOSE AEPB Inhale 1 puff into the lungs 2 (two) times daily. 04/19/15  Yes Renato Shin, MD  furosemide (LASIX) 40 MG tablet Take 1 tablet (40 mg total) by mouth daily. Patient taking differently: Take 40 mg by mouth 2 (two) times daily.  04/19/15  Yes Renato Shin, MD  gabapentin (NEURONTIN) 300 MG capsule TAKE ONE CAPSULE BY MOUTH EVERY NIGHT AT BEDTIME 04/10/15  Yes Renato Shin, MD  hydrALAZINE (APRESOLINE) 25 MG tablet TAKE 1 TABLET BY MOUTH THREE TIMES DAILY 03/31/15  Yes Renato Shin, MD  HYDROcodone-acetaminophen (NORCO) 10-325 MG tablet Take 1 tablet by mouth every 6 (six) hours as needed for moderate pain.   Yes Historical Provider, MD  insulin NPH-regular Human (NOVOLIN 70/30) (70-30) 100 UNIT/ML injection Inject 14 Units into the skin daily with breakfast. Sliding scale. Only takes in the morning 04/19/15  Yes Renato Shin, MD  isosorbide mononitrate  (IMDUR) 60 MG 24 hr tablet Take 60 mg by mouth daily. 04/05/15  Yes Historical Provider, MD  latanoprost (XALATAN) 0.005 % ophthalmic solution Place 1 drop into both eyes at bedtime.   Yes Historical Provider, MD  nitroGLYCERIN (NITROSTAT) 0.4 MG SL tablet Place 0.4 mg under the tongue every 5 (five) minutes as needed for chest pain.    Yes Historical Provider, MD  pantoprazole (PROTONIX) 40 MG tablet TAKE 1 TABLET BY MOUTH TWICE DAILY 03/31/15  Yes Renato Shin, MD  potassium chloride SA (K-DUR,KLOR-CON) 10 MEQ tablet Take 1 tablet (10 mEq total) by mouth daily. 03/09/15  Yes Bhavinkumar Bhagat, PA  ranolazine (RANEXA) 500 MG 12 hr tablet Take 1 tablet (500 mg total) by mouth 2 (two) times daily. 03/09/15  Yes Bhavinkumar Bhagat, PA  sertraline (ZOLOFT) 100 MG tablet TAKE 1 TABLET BY MOUTH EVERY DAY 03/31/15  Yes Renato Shin, MD  ZETIA 10 MG tablet TAKE 1 TABLET BY MOUTH EVERY MORNING 04/17/15  Yes Renato Shin, MD  azithromycin (ZITHROMAX) 500 MG tablet Take 1 tablet (500 mg total) by mouth daily. Patient not taking: Reported on 04/24/2015 04/19/15   Renato Shin, MD  isosorbide-hydrALAZINE (BIDIL) 20-37.5 MG tablet Take 1 tablet by mouth 3 (three) times daily. 03/14/15   Mihai Croitoru, MD  ONE TOUCH ULTRA TEST test strip USE TO TEST BLOOD SUGAR TWICE DAILY. 11/30/14   Renato Shin, MD    Physical Exam: Filed Vitals:   04/24/15 1945 04/24/15 2045 04/24/15 2109 04/24/15 2153  BP: 119/105 149/71 150/62 141/73  Pulse:  59 59 60  Temp:      TempSrc:      Resp: 18 21 24 20   Height:    5\' 6"  (1.676 m)  Weight:    90.8 kg (200 lb 2.8 oz)  SpO2:  96% 96% 97%     General:  Moderately built and nourished.  Eyes: Anicteric. No pallor.  ENT: No discharge from the ears eyes nose and mouth.  Neck: Mildly elevated JVD no mass felt.  Cardiovascular: S1-S2 heard.  Respiratory: No rhonchi but crepitations present.  Abdomen: Soft nontender bowel sounds present.  Skin: No  rash.  Musculoskeletal: Left BKA. Right lower extremity edema.  Psychiatric: Appears normal.  Neurologic: Alert awake oriented to time place and person. Moves all extremities.  Labs on Admission:  Basic Metabolic Panel:  Recent Labs Lab 04/24/15 1738  NA 141  K 4.4  CL 104  CO2 26  GLUCOSE 87  BUN 24*  CREATININE 2.05*  CALCIUM 8.8*   Liver Function Tests:  Recent Labs Lab 04/24/15 1738  AST 45*  ALT 32  ALKPHOS 111  BILITOT 1.0  PROT 6.3*  ALBUMIN 3.3*   No results for input(s): LIPASE, AMYLASE in the last 168 hours. No results for input(s): AMMONIA in the last 168 hours. CBC:  Recent Labs Lab 04/24/15 1738  WBC 6.8  NEUTROABS 5.0  HGB 10.5*  HCT 29.2*  MCV 92.1  PLT 365   Cardiac Enzymes:  Recent Labs Lab 04/24/15 1738  TROPONINI 0.17*    BNP (last 3 results)  Recent Labs  06/17/14 1330 06/17/14 1946 04/24/15 1738  BNP 1951.5* 1985.7* 1594.7*    ProBNP (last 3 results)  Recent Labs  06/08/14 1617  PROBNP 1249.0*    CBG:  Recent Labs Lab 04/24/15 2205  GLUCAP 153*    Radiological Exams on Admission: Dg Chest 2 View  04/24/2015  CLINICAL DATA:  Five day history of upper urinary tract infection. No improvement after antibiotics. Productive cough. EXAM: CHEST  2 VIEW COMPARISON:  04/19/2015 FINDINGS: The heart is enlarged but appears relatively stable. There is tortuosity and calcification of the thoracic aorta. Diffuse bilateral airspace process could reflect pulmonary edema or bilateral infiltrates. No definite pleural effusions. The bony thorax is intact. IMPRESSION: Cardiac enlargement. Diffuse airspace process could reflect pulmonary edema or bilateral infiltrates. No definite pleural effusions. Electronically Signed   By: Marijo Sanes M.D.   On: 04/24/2015 17:16     Assessment/Plan Principal Problem:   Acute respiratory failure with hypoxia (HCC) Active Problems:   DM (diabetes mellitus), type 2, uncontrolled, with  renal complications (HCC)   Hyperlipidemia   Essential hypertension   Coronary artery disease due to lipid rich plaque; 70% ostial-proximal AV Groove Cx - not good PCI target   Acute on chronic diastolic heart failure ([place])   HCAP (healthcare-associated pneumonia)   1. Acute respiratory failure with hypoxia - probably from pneumonia and CHF. Patient has productive cough on exam. Patient also has lower extremity edema. Patient's weight is usually around 197-200 pounds. Patient's Lasix was increased recently to twice a day. At this time patient will be placed on antibiotics for healthcare associated pneumonia since patient was recently admitted for unstable angina. Patient will be also on Lasix 40 mg IV twice daily. Closely follow intake output daily weights and metabolic panel. Patient is not on any Ace inhibitors due to renal failure but is on Bidil. Patient's last EF measured was 50-55% with grade 3 diastolic dysfunction in January 2016. 2. Diabetes mellitus type 2 - will continue insulin with sliding scale coverage. 3. Chronic kidney disease stage III with gradually worsening renal function - patient is on IV Lasix. Closely follow intake and output and metabolic panel. Check UA. 4. Nonobstructive CAD - with mildly elevated troponin. Probably secondary to CHF. Patient has had 2 cardiac cath this year. We will continue with statins and antiplatelet agents. Patient's cardiology note states that patient is off Imdur but patient states she takes Imdur  and also hydralazine. Patient is also on Bidil. 5. Asthmatic bronchitis - will continue patient's inhalers. 6. Hyperlipidemia - on statins. 7. Hypertension - see #4 with regarding to hydralazine and Imdur. 8. Chronic anemia - follow CBC. 9. OSA on C Pap. 10. Peripheral vascular disease status post left BKA. 11. History of gout on allopurinol.  I have reviewed patient's old charts on labs. Personally reviewed chest x-ray. EKG - shows NSR with non  specific ST-T changes.   DVT Prophylaxis Lovenox.  Code Status: Full code.  Family Communication: Discussed with patient.  Disposition Plan: Admit to inpatient.    Pernell Lenoir N. [facility] Pager [phone].  If 7PM-7AM, please contact night-coverage [email or web address] Password [email or web address] 04/24/2015, 11:55 PM

## 2015-04-24 NOTE — ED Notes (Signed)
Pt was given antibiotics 5 days ago for URI. Pt completed antibiotics and felt no better. Pt has productive cough with green sputum. Pt was sent here by her MD for eval of low spO2 at 88% RA. Pt also has sewlling to rt leg. Hr 58.  CBG 101

## 2015-04-24 NOTE — Patient Instructions (Signed)
i have sent prescriptions to your pharmacy, to refill both inhalers, and for an antibiotic pill.   Let's check a chest x-ray today.   Please reduce the insulin 14 units in the morning, and none in the evening.  Please take this amount, no matter what your blood sugar is.   check your blood sugar twice a day.  vary the time of day when you check, between before the 3 meals, and at bedtime.  also check if you have symptoms of your blood sugar being too high or too low.  please keep a record of the readings and bring it to your next appointment here.  You can write it on any piece of paper.  please call us sooner if your blood sugar goes below 70, or if you have a lot of readings over 200.   I hope you feel better soon.  If you don't feel better by next week, please call back.  Please call sooner if you get worse.   Please come back for a follow-up appointment in 3 months.

## 2015-04-24 NOTE — ED Provider Notes (Signed)
CSN: [id number]     Arrival date & time 04/24/15  1639 History   First MD Initiated Contact with Patient 04/24/15 1641     Chief Complaint  Patient presents with  . Pneumonia     (Consider location/radiation/quality/duration/timing/severity/associated sxs/prior Treatment) HPI  81 year old female presents with worsening cough with green sputum for the last 5 days. Started on azithromycin and finished yesterday. No relief of her symptoms and they're getting worse. Worsening shortness of breath as well as worsening leg swelling. Left leg is prosthetic, right leg has been progressively swelling despite increasing her Lasix from 20 mg twice a day to 40 mg twice a day. Denies fevers. Chest pain when coughing as well as epigastric abdominal pain when coughing. Does not normally wear oxygen, oxygen saturation 88% on room air.   Past Medical History  Diagnosis Date  . Uterine cancer ([place])   . Diabetes mellitus     type II; peripheral neuropathy  . Hypertension   . GERD (gastroesophageal reflux disease)   . Peripheral vascular disease ([place])     a. s/p L BKA 08/2012.  . Obesity   . Dyslipidemia   . Chronic combined systolic and diastolic CHF (congestive heart failure) (HCC)     a. EF 50-55%, mild LVH and grade 1 diast. Dysfxn b. Grade 3 Diastolic Dysfunction 40/9811. b. 02/2015: EF 45-50% by cath.  . Osteoarthritis cervical spine   . DM retinopathy ([place])   . Sleep apnea     with CPAP  . History of shingles   . COPD (chronic obstructive pulmonary disease) ([place])   . Depression   . Peptic ulcer disease     duodenal  . Peptic stricture of esophagus   . Hiatal hernia   . Diverticulosis   . Arthritis   . CAD (coronary artery disease)     a. s/p multiple caths with nonobs CAD;   b. cath 1/10: pLAD 20%, mLAD 40%, pCFX 20%, mCFX 40%, pRCA 60-70%;   c.  Myoview 06/02/12: Low anterior wall scar, no ischemia, EF 37%. d. cath 06/20/2014 70% mid LCx, medical therapy, high risk for PCI due to close  proximity to large OM e. cath 03/07/15 pro RCA 50% & pro to mid Cx 75%, both stable, LVEDP 33-80mm Hg-> medical management  . Cardiomyopathy ([place])     a. Echo 05/31/12: EF 40-45%. b. Echo 06/2014: EF 50-55%. c. Cath 02/2015: EF 45-50%.  . Asthma     Mild  . Pruritic condition     Idiopathic  . Zoster   . Noncompliance   . CKD (chronic kidney disease), stage III     Dr. Lorrene Reid  . Bradycardia     a. Not on BB due to this.   Past Surgical History  Procedure Laterality Date  . Tubal ligation  1967  . Knee arthroscopy  10/1998    Left  . Craniotomy  1997    Left for SDH  . Cataract extraction, bilateral  2005  . Hernia repair    . Esophagogastroduodenoscopy  04/04/2004  . Spine surgery      C-spine and lumbar surgery  . Cholecystectomy  2010  . Cardiac catheterization    . Dexa  7/05  . Amputation Left 09/04/2012    Procedure: AMPUTATION BELOW KNEE;  Surgeon: Angelia Mould, MD;  Location: [facility];  Service: Vascular;  Laterality: Left;  . Abdominal angiogram N/A 08/31/2012    Procedure: ABDOMINAL ANGIOGRAM with runoff poss intervention;  Surgeon: Angelia Mould, MD;  Location: [facility] CATH LAB;  Service: Cardiovascular;  Laterality: N/A;  . Tubal ligation    . Left heart catheterization with coronary angiogram N/A 06/22/2014    Procedure: LEFT HEART CATHETERIZATION WITH CORONARY ANGIOGRAM;  Surgeon: Burnell Blanks, MD;  Location: [facility] CATH LAB;  Service: Cardiovascular;  Laterality: N/A;  . Eye surgery    . Brain surgery      1997 blood clot on the brain, then had to relieve fluid on the brain  . Multiple extractions with alveoloplasty N/A 08/01/2014    Procedure: EXTRACTIONS OF TEETH NUMBERS 7 8 9 10 11  AND 19 AND ALVEOLOPLASTY UPPER LEFT AND RIGHT QUADRANT;  Surgeon: Isac Caddy, DDS;  Location: [facility];  Service: Oral Surgery;  Laterality: N/A;  . Cardiac catheterization N/A 03/07/2015    Procedure: Left Heart Cath and Coronary Angiography;  Surgeon: Leonie Man, MD;  Location: [facility] CV LAB;  Service: Cardiovascular;  Laterality: N/A;   Family History  Problem Relation Age of Onset  . Cancer - Other Mother     "Stomach" Cancer  . Diabetes Mother   . Heart disease Mother   . Stomach cancer Mother   . Hypertension Mother   . Lymphoma Father   . Hypertension Father   . Kidney disease Paternal Grandmother   . Asthma Other   . Diabetes Sister   . Rheum arthritis Mother   . Rheum arthritis Father   . Heart attack Neg Hx   . Stroke Paternal Grandmother    Social History  Substance Use Topics  . Smoking status: Never Smoker   . Smokeless tobacco: Never Used  . Alcohol Use: No     Comment: rare   OB History    No data available     Review of Systems  Constitutional: Negative for fever.  Respiratory: Positive for cough and shortness of breath.   Cardiovascular: Positive for chest pain (with coughin) and leg swelling.  All other systems reviewed and are negative.     Allergies  Iohexol  Home Medications   Prior to Admission medications   Medication Sig Start Date End Date Taking? Authorizing Provider  acetaminophen (TYLENOL) 500 MG tablet Take 500 mg by mouth every 6 (six) hours as needed for mild pain.   Yes Historical Provider, MD  albuterol (PROVENTIL HFA;VENTOLIN HFA) 108 (90 BASE) MCG/ACT inhaler Inhale 2 puffs into the lungs every 4 (four) hours as needed for wheezing or shortness of breath. 04/19/15  Yes Renato Shin, MD  albuterol (PROVENTIL) (2.5 MG/3ML) 0.083% nebulizer solution Take 3 mLs (2.5 mg total) by nebulization every 4 (four) hours as needed for wheezing or shortness of breath. 12/08/13  Yes Robbie Lis, MD  allopurinol (ZYLOPRIM) 100 MG tablet TAKE 1 TABLET BY MOUTH DAILY 07/25/14  Yes Renato Shin, MD  amLODipine (NORVASC) 10 MG tablet TAKE 1 TABLET BY MOUTH DAILY 03/31/15  Yes Renato Shin, MD  aspirin EC 81 MG tablet Take 1 tablet (81 mg total) by mouth daily. 03/16/15  Yes Dayna N Dunn, PA-C   atorvastatin (LIPITOR) 40 MG tablet TAKE 1 TABLET BY MOUTH DAILY AT 6 PM 03/31/15  Yes Renato Shin, MD  calcitRIOL (ROCALTROL) 0.25 MCG capsule TAKE ONE CAPSULE BY MOUTH EVERY DAY 04/29/14  Yes Renato Shin, MD  clopidogrel (PLAVIX) 75 MG tablet Take 75 mg by mouth daily.   Yes Historical Provider, MD  donepezil (ARICEPT) 5 MG tablet Take 1 tablet (5 mg total) by mouth at bedtime. 05/19/14  Yes Renato Shin, MD  feeding supplement (GLUCERNA SHAKE) LIQD Take 237 mLs by mouth every morning.    Yes Historical Provider, MD  Fluticasone-Salmeterol (ADVAIR DISKUS) 100-50 MCG/DOSE AEPB Inhale 1 puff into the lungs 2 (two) times daily. 04/19/15  Yes Renato Shin, MD  furosemide (LASIX) 40 MG tablet Take 1 tablet (40 mg total) by mouth daily. Patient taking differently: Take 40 mg by mouth 2 (two) times daily.  04/19/15  Yes Renato Shin, MD  gabapentin (NEURONTIN) 300 MG capsule TAKE ONE CAPSULE BY MOUTH EVERY NIGHT AT BEDTIME 04/10/15  Yes Renato Shin, MD  hydrALAZINE (APRESOLINE) 25 MG tablet TAKE 1 TABLET BY MOUTH THREE TIMES DAILY 03/31/15  Yes Renato Shin, MD  HYDROcodone-acetaminophen (NORCO) 10-325 MG tablet Take 1 tablet by mouth every 6 (six) hours as needed for moderate pain.   Yes Historical Provider, MD  insulin NPH-regular Human (NOVOLIN 70/30) (70-30) 100 UNIT/ML injection Inject 14 Units into the skin daily with breakfast. Sliding scale. Only takes in the morning 04/19/15  Yes Renato Shin, MD  latanoprost (XALATAN) 0.005 % ophthalmic solution Place 1 drop into both eyes at bedtime.   Yes Historical Provider, MD  nitroGLYCERIN (NITROSTAT) 0.4 MG SL tablet Place 0.4 mg under the tongue every 5 (five) minutes as needed for chest pain.    Yes Historical Provider, MD  pantoprazole (PROTONIX) 40 MG tablet TAKE 1 TABLET BY MOUTH TWICE DAILY 03/31/15  Yes Renato Shin, MD  potassium chloride SA (K-DUR,KLOR-CON) 10 MEQ tablet Take 1 tablet (10 mEq total) by mouth daily. 03/09/15  Yes Bhavinkumar  Bhagat, PA  ranolazine (RANEXA) 500 MG 12 hr tablet Take 1 tablet (500 mg total) by mouth 2 (two) times daily. 03/09/15  Yes Bhavinkumar Bhagat, PA  sertraline (ZOLOFT) 100 MG tablet TAKE 1 TABLET BY MOUTH EVERY DAY 03/31/15  Yes Renato Shin, MD  ZETIA 10 MG tablet TAKE 1 TABLET BY MOUTH EVERY MORNING 04/17/15  Yes Renato Shin, MD  azithromycin (ZITHROMAX) 500 MG tablet Take 1 tablet (500 mg total) by mouth daily. Patient not taking: Reported on 04/24/2015 04/19/15   Renato Shin, MD  docusate sodium (COLACE) 100 MG capsule Take 100 mg by mouth 2 (two) times daily as needed for mild constipation (constipation).     Historical Provider, MD  isosorbide mononitrate (IMDUR) 60 MG 24 hr tablet Take 60 mg by mouth daily. 04/05/15   Historical Provider, MD  isosorbide-hydrALAZINE (BIDIL) 20-37.5 MG tablet Take 1 tablet by mouth 3 (three) times daily. 03/14/15   Mihai Croitoru, MD  ONE TOUCH ULTRA TEST test strip USE TO TEST BLOOD SUGAR TWICE DAILY. 11/30/14   Renato Shin, MD   BP 134/53 mmHg  Pulse 58  Temp(Src) 97.8 F (36.6 C) (Oral)  Resp 24  SpO2 99% Physical Exam  Constitutional: She is oriented to person, place, and time. She appears well-developed and well-nourished. No distress.  HENT:  Head: Normocephalic and atraumatic.  Right Ear: External ear normal.  Left Ear: External ear normal.  Nose: Nose normal.  Eyes: Right eye exhibits no discharge. Left eye exhibits no discharge.  Cardiovascular: Normal rate, regular rhythm and normal heart sounds.   Pulmonary/Chest: Effort normal. She has rales (bilateral).  Abdominal: Soft. There is no tenderness.  Musculoskeletal: She exhibits edema.  Left leg prosthesis. Right leg with pitting edema  Neurological: She is alert and oriented to person, place, and time.  Skin: Skin is warm and dry. She is not diaphoretic.  Nursing note and vitals reviewed.  ED Course  Procedures (including critical care time) Labs Review Labs Reviewed   COMPREHENSIVE METABOLIC PANEL - Abnormal; Notable for the following:    BUN 24 (*)    Creatinine, Ser 2.05 (*)    Calcium 8.8 (*)    Total Protein 6.3 (*)    Albumin 3.3 (*)    AST 45 (*)    GFR calc non Af Amer 21 (*)    GFR calc Af Amer 25 (*)    All other components within normal limits  CBC WITH DIFFERENTIAL/PLATELET - Abnormal; Notable for the following:    RBC 3.17 (*)    Hemoglobin 10.5 (*)    HCT 29.2 (*)    RDW 20.6 (*)    All other components within normal limits  BRAIN NATRIURETIC PEPTIDE - Abnormal; Notable for the following:    B Natriuretic Peptide 1594.7 (*)    All other components within normal limits  TROPONIN I - Abnormal; Notable for the following:    Troponin I 0.17 (*)    All other components within normal limits  GLUCOSE, CAPILLARY - Abnormal; Notable for the following:    Glucose-Capillary 153 (*)    All other components within normal limits  MRSA PCR SCREENING  CULTURE, BLOOD (ROUTINE X 2)  CULTURE, BLOOD (ROUTINE X 2)  CULTURE, EXPECTORATED SPUTUM-ASSESSMENT  GRAM STAIN  PROCALCITONIN  TROPONIN I  TROPONIN I  TROPONIN I  HIV ANTIBODY (ROUTINE TESTING)  LEGIONELLA PNEUMOPHILA SEROGP 1 UR AG  STREP PNEUMONIAE URINARY ANTIGEN  BASIC METABOLIC PANEL  CBC  CBC  CREATININE, SERUM  I-STAT CG4 LACTIC ACID, ED    Imaging Review Dg Chest 2 View  04/24/2015  CLINICAL DATA:  Five day history of upper urinary tract infection. No improvement after antibiotics. Productive cough. EXAM: CHEST  2 VIEW COMPARISON:  04/19/2015 FINDINGS: The heart is enlarged but appears relatively stable. There is tortuosity and calcification of the thoracic aorta. Diffuse bilateral airspace process could reflect pulmonary edema or bilateral infiltrates. No definite pleural effusions. The bony thorax is intact. IMPRESSION: Cardiac enlargement. Diffuse airspace process could reflect pulmonary edema or bilateral infiltrates. No definite pleural effusions. Electronically Signed   By:  Marijo Sanes M.D.   On: 04/24/2015 17:16   I have personally reviewed and evaluated these images and lab results as part of my medical decision-making.   EKG Interpretation None      MDM   Final diagnoses:  Acute on chronic congestive heart failure, unspecified congestive heart failure type ([place])    I believe the patient's symptoms are most likely related to CHF rather than pneumonia. Normal white blood cell count, no fevers, and elevated BNP. Will give IV Lasix and plan to admit to the hospitalist. I believe her mild troponin elevation is more heart strain than an MI or ACS given no chest pain.    Sherwood Gambler, MD 04/25/15 [phone]

## 2015-04-25 ENCOUNTER — Encounter (HOSPITAL_COMMUNITY): Payer: Self-pay | Admitting: General Practice

## 2015-04-25 DIAGNOSIS — I10 Essential (primary) hypertension: Secondary | ICD-10-CM

## 2015-04-25 DIAGNOSIS — J9601 Acute respiratory failure with hypoxia: Secondary | ICD-10-CM

## 2015-04-25 DIAGNOSIS — N183 Chronic kidney disease, stage 3 (moderate): Secondary | ICD-10-CM

## 2015-04-25 DIAGNOSIS — I2510 Atherosclerotic heart disease of native coronary artery without angina pectoris: Secondary | ICD-10-CM

## 2015-04-25 DIAGNOSIS — J189 Pneumonia, unspecified organism: Secondary | ICD-10-CM

## 2015-04-25 DIAGNOSIS — G4733 Obstructive sleep apnea (adult) (pediatric): Secondary | ICD-10-CM

## 2015-04-25 LAB — GLUCOSE, CAPILLARY
GLUCOSE-CAPILLARY: 160 mg/dL — AB (ref 65–99)
GLUCOSE-CAPILLARY: 237 mg/dL — AB (ref 65–99)
Glucose-Capillary: 142 mg/dL — ABNORMAL HIGH (ref 65–99)
Glucose-Capillary: 141 mg/dL — ABNORMAL HIGH (ref 65–99)

## 2015-04-25 LAB — BASIC METABOLIC PANEL
Anion gap: 13 (ref 5–15)
BUN: 21 mg/dL — AB (ref 6–20)
CALCIUM: 8.7 mg/dL — AB (ref 8.9–10.3)
CHLORIDE: 101 mmol/L (ref 101–111)
CO2: 28 mmol/L (ref 22–32)
CREATININE: 1.94 mg/dL — AB (ref 0.44–1.00)
GFR calc non Af Amer: 23 mL/min — ABNORMAL LOW (ref >60)
GFR, EST AFRICAN AMERICAN: 26 mL/min — AB (ref >60)
GLUCOSE: 164 mg/dL — AB (ref 65–99)
Potassium: 3.5 mmol/L (ref 3.5–5.1)
Sodium: 142 mmol/L (ref 135–145)

## 2015-04-25 LAB — CBC
HCT: 33.7 % — ABNORMAL LOW (ref 36.0–46.0)
HEMATOCRIT: 30.6 % — AB (ref 36.0–46.0)
Hemoglobin: 10.6 g/dL — ABNORMAL LOW (ref 12.0–15.0)
Hemoglobin: 10.8 g/dL — ABNORMAL LOW (ref 12.0–15.0)
MCH: 27.6 pg (ref 26.0–34.0)
MCH: 30.7 pg (ref 26.0–34.0)
MCHC: 32 g/dL (ref 30.0–36.0)
MCHC: 34.6 g/dL (ref 30.0–36.0)
MCV: 86.2 fL (ref 78.0–100.0)
MCV: 88.7 fL (ref 78.0–100.0)
PLATELETS: 308 10*3/uL (ref 150–400)
PLATELETS: ADEQUATE 10*3/uL (ref 150–400)
RBC: 3.45 MIL/uL — ABNORMAL LOW (ref 3.87–5.11)
RBC: 3.91 MIL/uL (ref 3.87–5.11)
RDW: 19.1 % — ABNORMAL HIGH (ref 11.5–15.5)
RDW: 19.7 % — AB (ref 11.5–15.5)
WBC: 6.6 10*3/uL (ref 4.0–10.5)
WBC: 7.7 10*3/uL (ref 4.0–10.5)

## 2015-04-25 LAB — TROPONIN I
TROPONIN I: 0.15 ng/mL — AB (ref <0.031)
TROPONIN I: 0.16 ng/mL — AB (ref <0.031)
TROPONIN I: 0.16 ng/mL — AB (ref <0.031)

## 2015-04-25 LAB — INFLUENZA PANEL BY PCR (TYPE A & B)
H1N1FLUPCR: NOT DETECTED
INFLAPCR: NEGATIVE
Influenza B By PCR: NEGATIVE

## 2015-04-25 LAB — CREATININE, SERUM
Creatinine, Ser: 2.07 mg/dL — ABNORMAL HIGH (ref 0.44–1.00)
GFR calc Af Amer: 24 mL/min — ABNORMAL LOW (ref >60)
GFR, EST NON AFRICAN AMERICAN: 21 mL/min — AB (ref >60)

## 2015-04-25 LAB — MAGNESIUM: MAGNESIUM: 1.9 mg/dL (ref 1.7–2.4)

## 2015-04-25 LAB — HIV ANTIBODY (ROUTINE TESTING W REFLEX): HIV Screen 4th Generation wRfx: NONREACTIVE

## 2015-04-25 LAB — MRSA PCR SCREENING: MRSA BY PCR: NEGATIVE

## 2015-04-25 MED ORDER — FUROSEMIDE 10 MG/ML IJ SOLN
40.0000 mg | Freq: Two times a day (BID) | INTRAMUSCULAR | Status: DC
  Administered 2015-04-25 – 2015-04-26 (×4): 40 mg via INTRAVENOUS
  Filled 2015-04-25 (×4): qty 4

## 2015-04-25 MED ORDER — IPRATROPIUM-ALBUTEROL 0.5-2.5 (3) MG/3ML IN SOLN
3.0000 mL | Freq: Four times a day (QID) | RESPIRATORY_TRACT | Status: DC
  Administered 2015-04-25 – 2015-04-29 (×18): 3 mL via RESPIRATORY_TRACT
  Filled 2015-04-25 (×19): qty 3

## 2015-04-25 MED ORDER — POTASSIUM CHLORIDE CRYS ER 20 MEQ PO TBCR
20.0000 meq | EXTENDED_RELEASE_TABLET | Freq: Once | ORAL | Status: AC
  Administered 2015-04-25: 20 meq via ORAL
  Filled 2015-04-25: qty 1

## 2015-04-25 MED ORDER — GUAIFENESIN 100 MG/5ML PO SOLN
5.0000 mL | Freq: Four times a day (QID) | ORAL | Status: DC | PRN
  Administered 2015-04-25 – 2015-05-05 (×5): 100 mg via ORAL
  Filled 2015-04-25: qty 5
  Filled 2015-04-25: qty 25
  Filled 2015-04-25 (×4): qty 5

## 2015-04-25 MED ORDER — ENOXAPARIN SODIUM 30 MG/0.3ML ~~LOC~~ SOLN
30.0000 mg | Freq: Every day | SUBCUTANEOUS | Status: DC
  Administered 2015-04-25 – 2015-04-30 (×6): 30 mg via SUBCUTANEOUS
  Filled 2015-04-25 (×6): qty 0.3

## 2015-04-25 MED ORDER — HYDRALAZINE HCL 25 MG PO TABS
25.0000 mg | ORAL_TABLET | Freq: Once | ORAL | Status: AC
  Administered 2015-04-25: 25 mg via ORAL
  Filled 2015-04-25: qty 1

## 2015-04-25 MED ORDER — HYDRALAZINE HCL 50 MG PO TABS
50.0000 mg | ORAL_TABLET | Freq: Three times a day (TID) | ORAL | Status: DC
  Administered 2015-04-25 – 2015-04-29 (×12): 50 mg via ORAL
  Filled 2015-04-25 (×12): qty 1

## 2015-04-25 MED ORDER — GUAIFENESIN 100 MG/5ML PO SOLN: 5.0000 mL | ORAL | Status: DC | PRN

## 2015-04-25 NOTE — Progress Notes (Signed)
PROGRESS NOTE    [name] RJJ:[id number] DOB: Mar 29, 1930 DOA: 04/24/2015 PCP: Renato Shin, MD  Cardiology: Dr. Ena Dawley.  HPI/Brief narrative 81 year old female patient with history of chronic combined CHF (EF 45-50 percent & grade 3 diastolic dysfunction by [facility] 02/2015), CAD, DM 2, HTN, HLD, OSA on nightly CPAP, PAD status post left BKA, stage III CKD, COPD/? Asthma, bradycardia (hence not on beta blockers), recent hospitalization 9/19-9/22 for unstable angina at which time cath showed stable anatomy, now presented to [facility] ED on 04/24/15 with worsening dyspnea, orthopnea, productive cough, pleuritic appearing chest pain but no fevers. Recently seen by PCP and started on azithromycin for suspected pneumonia & Lasix dose increased. Noncompliant with fluid and water restriction. Admitted for decompensated CHF &? Pneumonia. Cardiology consulted.  Assessment/Plan:  Acute on chronic combined systolic and diastolic CHF - EF 01-60 percent and grade 3 diastolic dysfunction by [facility] 02/2015. - Noncompliant with fluid and salt restriction. Patient counseled extensively. - Continue IV Lasix 40 mg every 12 hours. Strict intake and output. Daily weights. - No ACEI/ARB secondary to renal failure. Continue BiDil. - Cardiology input appreciated.  Possible healthcare associated pneumonia - Does not seem convincing. For now continue IV vancomycin and Fortaz. Consider discontinuing or narrowing antibiotic spectrum if chest x-ray and clinical improvement. Pro-calcitonin low.  Acute respiratory failure with hypoxia - Secondary to decompensated CHF,? Pneumonia complicating underlying COPD versus asthma and OSA. - Treat underlying cause and wean off of oxygen as tolerated.  Type II DM with renal complications - Continue home 70/30 insulin and monitor closely.  Stage III chronic kidney disease - New baseline is probably in the 1.8-1.9 range. Stable. Follow BMP closely while patient is being  diuresed.  Nonobstructive CAD - LHC recently showed nonobstructive disease. Elevated troponin secondary to demand ischemia from CHF. Flat troponin trends. No ongoing chest pains. - Continue statins, aspirin, Plavix, Ranexa, hydralazine and Imdur  Hyperlipidemia - Continue statins and Ezetimibe  OSA - Nightly CPAP  PAD status post left BKA - Healed stump  Gout - Allopurinol  Chronic anemia - Stable   DVT prophylaxis: Lovenox Code Status: Full Family Communication: None at bedside Disposition Plan: DC home when medically stable   Consultants:  Cardiology  Procedures:  None  Antibiotics:  IV Tressie Ellis 11/7 >  IV Vancomycin 11/7 >   Subjective: Feels slightly better with improvement in dyspnea. Mostly nonproductive cough. No chest pain reported. Leg swelling decreasing.  Objective: Filed Vitals:   04/25/15 0300 04/25/15 0740 04/25/15 0826 04/25/15 1221  BP: 147/47 155/60  143/65  Pulse: 58 59  67  Temp: 98.6 F (37 C) 97.8 F (36.6 C)  98.1 F (36.7 C)  TempSrc: Oral Oral  Oral  Resp:  18  18  Height:      Weight:      SpO2: 100% 100% 2% 93%    Intake/Output Summary (Last 24 hours) at 04/25/15 1244 Last data filed at 04/25/15 0925  Gross per 24 hour  Intake    670 ml  Output      0 ml  Net    670 ml   Filed Weights   04/24/15 1805 04/24/15 2153  Weight: 89.676 kg (197 lb 11.2 oz) 90.8 kg (200 lb 2.8 oz)     Exam:  General exam: Pleasant elderly female, moderately built and overweight, lying comfortably propped up in bed. Respiratory system: Reduced breath sounds bilaterally with scattered few basal crackles and bilateral occasional expiratory rhonchi. No increased work of  breathing. Telemetry: SB in the 50s-SR in the 60s. An episode of 5 beat NSVT on 11/8 at 7:13 AM. Cardiovascular system: S1 & S2 heard, RRR. No JVD, murmurs, gallops, clicks. Gastrointestinal system: Abdomen is nondistended, soft and nontender. Normal bowel sounds  heard. Central nervous system: Alert and oriented. No focal neurological deficits. Extremities: Symmetric 5 x 5 power. Left BKA with healed stump. Right leg with 2+ pitting edema   Data Reviewed: Basic Metabolic Panel:  Recent Labs Lab 04/24/15 1738 04/25/15 0051 04/25/15 0708  NA 141  --  142  K 4.4  --  3.5  CL 104  --  101  CO2 26  --  28  GLUCOSE 87  --  164*  BUN 24*  --  21*  CREATININE 2.05* 2.07* 1.94*  CALCIUM 8.8*  --  8.7*   Liver Function Tests:  Recent Labs Lab 04/24/15 1738  AST 45*  ALT 32  ALKPHOS 111  BILITOT 1.0  PROT 6.3*  ALBUMIN 3.3*   No results for input(s): LIPASE, AMYLASE in the last 168 hours. No results for input(s): AMMONIA in the last 168 hours. CBC:  Recent Labs Lab 04/24/15 1738 04/25/15 0051 04/25/15 0708  WBC 6.8 6.6 7.7  NEUTROABS 5.0  --   --   HGB 10.5* 10.6* 10.8*  HCT 29.2* 30.6* 33.7*  MCV 92.1 88.7 86.2  PLT 365 308 PLATELET CLUMPS NOTED ON SMEAR, COUNT APPEARS ADEQUATE   Cardiac Enzymes:  Recent Labs Lab 04/24/15 1738 04/25/15 0051 04/25/15 0708  TROPONINI 0.17* 0.16* 0.16*   BNP (last 3 results)  Recent Labs  06/08/14 1617  PROBNP 1249.0*   CBG:  Recent Labs Lab 04/24/15 2205 04/25/15 0610 04/25/15 1126  GLUCAP 153* 142* 160*    Recent Results (from the past 240 hour(s))  MRSA PCR Screening     Status: None   Collection Time: 04/24/15 10:51 PM  Result Value Ref Range Status   MRSA by PCR NEGATIVE NEGATIVE Final    Comment:        The GeneXpert MRSA Assay (FDA approved for NASAL specimens only), is one component of a comprehensive MRSA colonization surveillance program. It is not intended to diagnose MRSA infection nor to guide or monitor treatment for MRSA infections.            Studies: Dg Chest 2 View  04/24/2015  CLINICAL DATA:  Five day history of upper urinary tract infection. No improvement after antibiotics. Productive cough. EXAM: CHEST  2 VIEW COMPARISON:   04/19/2015 FINDINGS: The heart is enlarged but appears relatively stable. There is tortuosity and calcification of the thoracic aorta. Diffuse bilateral airspace process could reflect pulmonary edema or bilateral infiltrates. No definite pleural effusions. The bony thorax is intact. IMPRESSION: Cardiac enlargement. Diffuse airspace process could reflect pulmonary edema or bilateral infiltrates. No definite pleural effusions. Electronically Signed   By: Marijo Sanes M.D.   On: 04/24/2015 17:16        Scheduled Meds: . allopurinol  100 mg Oral Daily  . amLODipine  10 mg Oral Daily  . aspirin EC  81 mg Oral Daily  . atorvastatin  40 mg Oral q1800  . calcitRIOL  0.25 mcg Oral Daily  . cefTAZidime (FORTAZ)  IV  2 g Intravenous Q24H  . clopidogrel  75 mg Oral Daily  . donepezil  5 mg Oral QHS  . enoxaparin (LOVENOX) injection  40 mg Subcutaneous QHS  . ezetimibe  10 mg Oral q morning - 10a  .  feeding supplement (GLUCERNA SHAKE)  237 mL Oral q morning - 10a  . furosemide  40 mg Intravenous Q12H  . gabapentin  300 mg Oral QHS  . hydrALAZINE  50 mg Oral TID  . insulin aspart protamine- aspart  14 Units Subcutaneous Q breakfast  . ipratropium-albuterol  3 mL Nebulization Q6H  . isosorbide mononitrate  60 mg Oral Daily  . latanoprost  1 drop Both Eyes QHS  . mometasone-formoterol  2 puff Inhalation BID  . pantoprazole  40 mg Oral BID  . potassium chloride SA  10 mEq Oral Daily  . ranolazine  500 mg Oral BID  . sertraline  100 mg Oral Daily  . sodium chloride  3 mL Intravenous Q12H  . vancomycin  1,000 mg Intravenous Q24H   Continuous Infusions:   Principal Problem:   Acute respiratory failure with hypoxia (HCC) Active Problems:   DM (diabetes mellitus), type 2, uncontrolled, with renal complications (HCC)   Hyperlipidemia   Essential hypertension   Coronary artery disease due to lipid rich plaque; 70% ostial-proximal AV Groove Cx - not good PCI target   Acute on chronic diastolic  heart failure ([place])   HCAP (healthcare-associated pneumonia)    Time spent: 40 minutes.    Vernell Leep, MD, FACP, FHM. [facility] Pager [phone]  If 7PM-7AM, please contact night-coverage [email or web address] Password [email or web address] 04/25/2015, 12:44 PM    LOS: 1 day

## 2015-04-25 NOTE — Progress Notes (Signed)
Received referral - patient needs medication assistance - patient has private insurance with [employer] with Prescription drug coverage - CM unable to provide any assistance. Mindi Slicker [facility] [phone]   Member: DEICY, RUSK [[id number]]  Plan: [facility] MEDICARE [[id number]] Payor: Holley Bouche M*    Coverage Information    Coverage information:     Subscriber: [id number] [place] C     Rel to sub: 01 - Self     Member ID: [id number]     Payor: [employer] MEDICARE     Benefit plan: [id number] MEDICARE Ph: [phone]     Group number: [id number]     Member effective dates:

## 2015-04-25 NOTE — Consult Note (Signed)
PCP: Renato Shin, MD  Cardiologist:  Meda Coffee Reason for Consult: CHF and + troponin Referring Physician: Dr. Hinda [name] is an 81 y.o. female.  HPI:   [name] is an 81 y.o. female with history of chronic combined CHF (EF 45-50% by [facility] 02/2015), CAD (NSTEMI 06/2014 with LHC showing moderate disease treated medically, recent cath 02/2015 with stable anatomy), DM, HTN, HLD, OSA (on CPAP), PVD s/p L BKA 08/2012, CKD stage III, COPD, bradycardia (not on BB due to this) who presents for post-hospital follow-up. Last echo 06/2014: EF 50-55%, no rWMA, grade 3 DD, mildly dilated LA.  She was admitted 9/19-9/22/16 with chest pain, SOB, left arm pain and right leg pain. She was admitted for [place] and had mild troponin elevation to 0.19. Cath 03/07/15 showed stable coronary angiography and elevated LVEDP - elevated troponin was felt more likely supply/demand in setting of CHF. Bidil was added in place of hydralazine, along with Ranexa. She also received IV Lasix post-cath. She had AKI on CKD with elevation of Cr to 1.83 and was kept in the hospital for further monitoring. At discharge her Cr was 1.94.   The patient presents with SOB, orthopnea, productive cough, chest congestion, CP(sharp-nitro sometimes helps).  She was started on an antibiotic by Dr. Loanne Drilling last week.  Yesterday she began to feel "real bad" with HA and right LEE(Hx L BKA). No nausea or vomiting but seems to be getting choked up.  Her family has also been encouraging her to drink 8 bottles of water per day.  She has been drinking more but knows she should not be drinking that much.  Her daughter is cooking Sunday dinner and other meals and is "heavy-handed" with the meat tenderizer.  Her weight in the office on 03/16/2015 was 197 pounds. She has had a decreased appetite for a month, which is likely affecting her dry weight.  The patient currently denies nausea, vomiting, fever, of breath, orthopnea, dizziness, PND,  cough, congestion, abdominal pain, hematochezia, melena, lower extremity edema, claudication.        Past Medical History  Diagnosis Date  . Uterine cancer ([place])   . Diabetes mellitus     type II; peripheral neuropathy  . Hypertension   . GERD (gastroesophageal reflux disease)   . Peripheral vascular disease ([place])     a. s/p L BKA 08/2012.  . Obesity   . Dyslipidemia   . Chronic combined systolic and diastolic CHF (congestive heart failure) (HCC)     a. EF 50-55%, mild LVH and grade 1 diast. Dysfxn b. Grade 3 Diastolic Dysfunction 53/9767. b. 02/2015: EF 45-50% by cath.  . Osteoarthritis cervical spine   . DM retinopathy ([place])   . Sleep apnea     with CPAP  . History of shingles   . COPD (chronic obstructive pulmonary disease) ([place])   . Depression   . Peptic ulcer disease     duodenal  . Peptic stricture of esophagus   . Hiatal hernia   . Diverticulosis   . Arthritis   . CAD (coronary artery disease)     a. s/p multiple caths with nonobs CAD;   b. cath 1/10: pLAD 20%, mLAD 40%, pCFX 20%, mCFX 40%, pRCA 60-70%;   c.  Myoview 06/02/12: Low anterior wall scar, no ischemia, EF 37%. d. cath 06/20/2014 70% mid LCx, medical therapy, high risk for PCI due to close proximity to large OM e. cath 03/07/15 pro RCA 50% & pro to mid  Cx 75%, both stable, LVEDP 33-77mm Hg-> medical management  . Cardiomyopathy ([place])     a. Echo 05/31/12: EF 40-45%. b. Echo 06/2014: EF 50-55%. c. Cath 02/2015: EF 45-50%.  . Asthma     Mild  . Pruritic condition     Idiopathic  . Zoster   . Noncompliance   . CKD (chronic kidney disease), stage III     Dr. Lorrene Reid  . Bradycardia     a. Not on BB due to this.  . Shortness of breath dyspnea     Past Surgical History  Procedure Laterality Date  . Tubal ligation  1967  . Knee arthroscopy  10/1998    Left  . Craniotomy  1997    Left for SDH  . Cataract extraction, bilateral  2005  . Hernia repair    . Esophagogastroduodenoscopy  04/04/2004  . Spine surgery       C-spine and lumbar surgery  . Cholecystectomy  2010  . Cardiac catheterization    . Dexa  7/05  . Amputation Left 09/04/2012    Procedure: AMPUTATION BELOW KNEE;  Surgeon: Angelia Mould, MD;  Location: [place];  Service: Vascular;  Laterality: Left;  . Abdominal angiogram N/A 08/31/2012    Procedure: ABDOMINAL ANGIOGRAM with runoff poss intervention;  Surgeon: Angelia Mould, MD;  Location: [facility] CATH LAB;  Service: Cardiovascular;  Laterality: N/A;  . Tubal ligation    . Left heart catheterization with coronary angiogram N/A 06/22/2014    Procedure: LEFT HEART CATHETERIZATION WITH CORONARY ANGIOGRAM;  Surgeon: Burnell Blanks, MD;  Location: [facility] CATH LAB;  Service: Cardiovascular;  Laterality: N/A;  . Eye surgery    . Brain surgery      1997 blood clot on the brain, then had to relieve fluid on the brain  . Multiple extractions with alveoloplasty N/A 08/01/2014    Procedure: EXTRACTIONS OF TEETH NUMBERS $[id number]'[id number] 10 11 '[id number]$ AND 19 AND ALVEOLOPLASTY UPPER LEFT AND RIGHT QUADRANT;  Surgeon: Isac Caddy, DDS;  Location: [place];  Service: Oral Surgery;  Laterality: N/A;  . Cardiac catheterization N/A 03/07/2015    Procedure: Left Heart Cath and Coronary Angiography;  Surgeon: Leonie Man, MD;  Location: [place] CV LAB;  Service: Cardiovascular;  Laterality: N/A;    Family History  Problem Relation Age of Onset  . Cancer - Other Mother     "Stomach" Cancer  . Diabetes Mother   . Heart disease Mother   . Stomach cancer Mother   . Hypertension Mother   . Lymphoma Father   . Hypertension Father   . Kidney disease Paternal Grandmother   . Asthma Other   . Diabetes Sister   . Rheum arthritis Mother   . Rheum arthritis Father   . Heart attack Neg Hx   . Stroke Paternal Grandmother     Social History:  reports that she has never smoked. She has never used smokeless tobacco. She reports that she does not drink alcohol or use illicit drugs.  Allergies:  Allergies    Allergen Reactions  . Iohexol Itching and Rash     Code: RASH, Desc: [place] ON PT'S CHART ALLERGIC TO IV DYE 09/04/07/RM, Onset Date: [id number]     Medications:  Scheduled Meds: . allopurinol  100 mg Oral Daily  . amLODipine  10 mg Oral Daily  . aspirin EC  81 mg Oral Daily  . atorvastatin  40 mg Oral q1800  . calcitRIOL  0.25 mcg Oral  Daily  . cefTAZidime (FORTAZ)  IV  2 g Intravenous Q24H  . clopidogrel  75 mg Oral Daily  . donepezil  5 mg Oral QHS  . enoxaparin (LOVENOX) injection  40 mg Subcutaneous QHS  . ezetimibe  10 mg Oral q morning - 10a  . feeding supplement (GLUCERNA SHAKE)  237 mL Oral q morning - 10a  . furosemide  40 mg Intravenous Q12H  . gabapentin  300 mg Oral QHS  . hydrALAZINE  25 mg Oral TID  . insulin aspart protamine- aspart  14 Units Subcutaneous Q breakfast  . ipratropium-albuterol  3 mL Nebulization Q6H  . isosorbide mononitrate  60 mg Oral Daily  . latanoprost  1 drop Both Eyes QHS  . mometasone-formoterol  2 puff Inhalation BID  . pantoprazole  40 mg Oral BID  . potassium chloride SA  10 mEq Oral Daily  . ranolazine  500 mg Oral BID  . sertraline  100 mg Oral Daily  . sodium chloride  3 mL Intravenous Q12H  . vancomycin  1,000 mg Intravenous Q24H   Continuous Infusions:  PRN Meds:.acetaminophen **OR** acetaminophen, albuterol, guaiFENesin, HYDROcodone-acetaminophen, nitroGLYCERIN, ondansetron **OR** ondansetron (ZOFRAN) IV   Results for orders placed or performed during the hospital encounter of 04/24/15 (from the past 48 hour(s))  Comprehensive metabolic panel     Status: Abnormal   Collection Time: 04/24/15  5:38 PM  Result Value Ref Range   Sodium 141 135 - 145 mmol/L   Potassium 4.4 3.5 - 5.1 mmol/L   Chloride 104 101 - 111 mmol/L   CO2 26 22 - 32 mmol/L   Glucose, Bld 87 65 - 99 mg/dL   BUN 24 (H) 6 - 20 mg/dL   Creatinine, Ser 1.13 (H) 0.44 - 1.00 mg/dL   Calcium 8.8 (L) 8.9 - 10.3 mg/dL   Total Protein 6.3 (L) 6.5 - 8.1  g/dL   Albumin 3.3 (L) 3.5 - 5.0 g/dL   AST 45 (H) 15 - 41 U/L   ALT 32 14 - 54 U/L   Alkaline Phosphatase 111 38 - 126 U/L   Total Bilirubin 1.0 0.3 - 1.2 mg/dL   GFR calc non Af Amer 21 (L) >60 mL/min   GFR calc Af Amer 25 (L) >60 mL/min    Comment: (NOTE) The eGFR has been calculated using the CKD EPI equation. This calculation has not been validated in all clinical situations. eGFR's persistently <60 mL/min signify possible Chronic Kidney Disease.    Anion gap 11 5 - 15  CBC with Differential     Status: Abnormal   Collection Time: 04/24/15  5:38 PM  Result Value Ref Range   WBC 6.8 4.0 - 10.5 K/uL   RBC 3.17 (L) 3.87 - 5.11 MIL/uL   Hemoglobin 10.5 (L) 12.0 - 15.0 g/dL   HCT 75.7 (L) 14.5 - 81.8 %   MCV 92.1 78.0 - 100.0 fL   MCH 33.1 26.0 - 34.0 pg   MCHC 36.0 30.0 - 36.0 g/dL   RDW 25.4 (H) 93.8 - 65.0 %   Platelets 365 150 - 400 K/uL   Neutrophils Relative % 73 %   Neutro Abs 5.0 1.7 - 7.7 K/uL   Lymphocytes Relative 13 %   Lymphs Abs 0.9 0.7 - 4.0 K/uL   Monocytes Relative 12 %   Monocytes Absolute 0.8 0.1 - 1.0 K/uL   Eosinophils Relative 2 %   Eosinophils Absolute 0.1 0.0 - 0.7 K/uL   Basophils Relative 0 %  Basophils Absolute 0.0 0.0 - 0.1 K/uL  Brain natriuretic peptide     Status: Abnormal   Collection Time: 04/24/15  5:38 PM  Result Value Ref Range   B Natriuretic Peptide 1594.7 (H) 0.0 - 100.0 pg/mL  Troponin I     Status: Abnormal   Collection Time: 04/24/15  5:38 PM  Result Value Ref Range   Troponin I 0.17 (H) <0.031 ng/mL    Comment:        PERSISTENTLY INCREASED TROPONIN VALUES IN THE RANGE OF 0.04-0.49 ng/mL CAN BE SEEN IN:       -UNSTABLE ANGINA       -CONGESTIVE HEART FAILURE       -MYOCARDITIS       -CHEST TRAUMA       -ARRYHTHMIAS       -LATE PRESENTING MYOCARDIAL INFARCTION       -COPD   CLINICAL FOLLOW-UP RECOMMENDED.   I-Stat CG4 Lactic Acid, ED     Status: None   Collection Time: 04/24/15  5:58 PM  Result Value Ref Range    Lactic Acid, Venous 1.14 0.5 - 2.0 mmol/L  Procalcitonin     Status: None   Collection Time: 04/24/15  8:59 PM  Result Value Ref Range   Procalcitonin <0.10 ng/mL    Comment:        Interpretation: PCT (Procalcitonin) <= 0.5 ng/mL: Systemic infection (sepsis) is not likely. Local bacterial infection is possible. (NOTE)         ICU PCT Algorithm               Non ICU PCT Algorithm    ----------------------------     ------------------------------         PCT < 0.25 ng/mL                 PCT < 0.1 ng/mL     Stopping of antibiotics            Stopping of antibiotics       strongly encouraged.               strongly encouraged.    ----------------------------     ------------------------------       PCT level decrease by               PCT < 0.25 ng/mL       >= 80% from peak PCT       OR PCT 0.25 - 0.5 ng/mL          Stopping of antibiotics                                             encouraged.     Stopping of antibiotics           encouraged.    ----------------------------     ------------------------------       PCT level decrease by              PCT >= 0.25 ng/mL       < 80% from peak PCT        AND PCT >= 0.5 ng/mL            Continuin g antibiotics  encouraged.       Continuing antibiotics            encouraged.    ----------------------------     ------------------------------     PCT level increase compared          PCT > 0.5 ng/mL         with peak PCT AND          PCT >= 0.5 ng/mL             Escalation of antibiotics                                          strongly encouraged.      Escalation of antibiotics        strongly encouraged.   Glucose, capillary     Status: Abnormal   Collection Time: 04/24/15 10:05 PM  Result Value Ref Range   Glucose-Capillary 153 (H) 65 - 99 mg/dL  MRSA PCR Screening     Status: None   Collection Time: 04/24/15 10:51 PM  Result Value Ref Range   MRSA by PCR NEGATIVE NEGATIVE    Comment:         The GeneXpert MRSA Assay (FDA approved for NASAL specimens only), is one component of a comprehensive MRSA colonization surveillance program. It is not intended to diagnose MRSA infection nor to guide or monitor treatment for MRSA infections.   Troponin I (q 6hr x 3)     Status: Abnormal   Collection Time: 04/25/15 12:51 AM  Result Value Ref Range   Troponin I 0.16 (H) <0.031 ng/mL    Comment:        PERSISTENTLY INCREASED TROPONIN VALUES IN THE RANGE OF 0.04-0.49 ng/mL CAN BE SEEN IN:       -UNSTABLE ANGINA       -CONGESTIVE HEART FAILURE       -MYOCARDITIS       -CHEST TRAUMA       -ARRYHTHMIAS       -LATE PRESENTING MYOCARDIAL INFARCTION       -COPD   CLINICAL FOLLOW-UP RECOMMENDED.   CBC     Status: Abnormal   Collection Time: 04/25/15 12:51 AM  Result Value Ref Range   WBC 6.6 4.0 - 10.5 K/uL   RBC 3.45 (L) 3.87 - 5.11 MIL/uL   Hemoglobin 10.6 (L) 12.0 - 15.0 g/dL   HCT 30.6 (L) 36.0 - 46.0 %   MCV 88.7 78.0 - 100.0 fL   MCH 30.7 26.0 - 34.0 pg   MCHC 34.6 30.0 - 36.0 g/dL   RDW 19.7 (H) 11.5 - 15.5 %   Platelets 308 150 - 400 K/uL  Creatinine, serum     Status: Abnormal   Collection Time: 04/25/15 12:51 AM  Result Value Ref Range   Creatinine, Ser 2.07 (H) 0.44 - 1.00 mg/dL   GFR calc non Af Amer 21 (L) >60 mL/min   GFR calc Af Amer 24 (L) >60 mL/min    Comment: (NOTE) The eGFR has been calculated using the CKD EPI equation. This calculation has not been validated in all clinical situations. eGFR's persistently <60 mL/min signify possible Chronic Kidney Disease.   Glucose, capillary     Status: Abnormal   Collection Time: 04/25/15  6:10 AM  Result Value Ref Range   Glucose-Capillary 142 (H) 65 - 99 mg/dL   Comment 1 Notify  RN    Comment 2 Document in Chart   Troponin I (q 6hr x 3)     Status: Abnormal   Collection Time: 04/25/15  7:08 AM  Result Value Ref Range   Troponin I 0.16 (H) <0.031 ng/mL    Comment:        PERSISTENTLY INCREASED  TROPONIN VALUES IN THE RANGE OF 0.04-0.49 ng/mL CAN BE SEEN IN:       -UNSTABLE ANGINA       -CONGESTIVE HEART FAILURE       -MYOCARDITIS       -CHEST TRAUMA       -ARRYHTHMIAS       -LATE PRESENTING MYOCARDIAL INFARCTION       -COPD   CLINICAL FOLLOW-UP RECOMMENDED.   Basic metabolic panel     Status: Abnormal   Collection Time: 04/25/15  7:08 AM  Result Value Ref Range   Sodium 142 135 - 145 mmol/L   Potassium 3.5 3.5 - 5.1 mmol/L   Chloride 101 101 - 111 mmol/L   CO2 28 22 - 32 mmol/L   Glucose, Bld 164 (H) 65 - 99 mg/dL   BUN 21 (H) 6 - 20 mg/dL   Creatinine, Ser 1.94 (H) 0.44 - 1.00 mg/dL   Calcium 8.7 (L) 8.9 - 10.3 mg/dL   GFR calc non Af Amer 23 (L) >60 mL/min   GFR calc Af Amer 26 (L) >60 mL/min    Comment: (NOTE) The eGFR has been calculated using the CKD EPI equation. This calculation has not been validated in all clinical situations. eGFR's persistently <60 mL/min signify possible Chronic Kidney Disease.    Anion gap 13 5 - 15  CBC     Status: Abnormal   Collection Time: 04/25/15  7:08 AM  Result Value Ref Range   WBC 7.7 4.0 - 10.5 K/uL   RBC 3.91 3.87 - 5.11 MIL/uL   Hemoglobin 10.8 (L) 12.0 - 15.0 g/dL   HCT 33.7 (L) 36.0 - 46.0 %   MCV 86.2 78.0 - 100.0 fL   MCH 27.6 26.0 - 34.0 pg   MCHC 32.0 30.0 - 36.0 g/dL   RDW 19.1 (H) 11.5 - 15.5 %   Platelets  150 - 400 K/uL    PLATELET CLUMPS NOTED ON SMEAR, COUNT APPEARS ADEQUATE    Dg Chest 2 View  04/24/2015  CLINICAL DATA:  Five day history of upper urinary tract infection. No improvement after antibiotics. Productive cough. EXAM: CHEST  2 VIEW COMPARISON:  04/19/2015 FINDINGS: The heart is enlarged but appears relatively stable. There is tortuosity and calcification of the thoracic aorta. Diffuse bilateral airspace process could reflect pulmonary edema or bilateral infiltrates. No definite pleural effusions. The bony thorax is intact. IMPRESSION: Cardiac enlargement. Diffuse airspace process could reflect  pulmonary edema or bilateral infiltrates. No definite pleural effusions. Electronically Signed   By: Marijo Sanes M.D.   On: 04/24/2015 17:16    Review of Systems  Constitutional: Negative for fever and diaphoresis.  HENT: Positive for congestion. Negative for sore throat.   Respiratory: Positive for cough, sputum production, shortness of breath and wheezing.   Cardiovascular: Positive for chest pain, orthopnea, leg swelling (Right leg) and PND.  Gastrointestinal: Negative for nausea, vomiting, abdominal pain, blood in stool and melena.  All other systems reviewed and are negative.  Blood pressure 155/60, pulse 59, temperature 97.8 F (36.6 C), temperature source Oral, resp. rate 18, height $RemoveBe'5\' 6"'oMGkbmgPd$  (1.676 m), weight 200 lb 2.8 oz (90.8 kg),  SpO2 2 %. Physical Exam  Nursing note and vitals reviewed. Constitutional: She is oriented to person, place, and time. She appears well-developed. No distress.  Obese  HENT:  Head: Normocephalic and atraumatic.  Eyes: EOM are normal. Pupils are equal, round, and reactive to light. No scleral icterus.  Neck: Normal range of motion. Neck supple. JVD present.  Cardiovascular: Normal rate, regular rhythm, S1 normal and S2 normal.   No murmur heard. Pulses:      Radial pulses are 2+ on the right side, and 2+ on the left side.       Dorsalis pedis pulses are 2+ on the right side. Left dorsalis pedis pulse not accessible.  Respiratory: Effort normal. She has wheezes. She has rales.  GI: Soft. Bowel sounds are normal. She exhibits no distension. There is tenderness (mildly).  Musculoskeletal: She exhibits edema (1+ LEE).  Lymphadenopathy:    She has no cervical adenopathy.  Neurological: She is alert and oriented to person, place, and time. She exhibits normal muscle tone.  Skin: Skin is warm and dry.  Psychiatric: She has a normal mood and affect.    Assessment/Plan: Principal Problem:   Acute respiratory failure with hypoxia (HCC) Active  Problems:   DM (diabetes mellitus), type 2, uncontrolled, with renal complications (HCC)   Hyperlipidemia   Essential hypertension   Coronary artery disease due to lipid rich plaque; 70% ostial-proximal AV Groove Cx - not good PCI target   Acute on chronic diastolic heart failure (HCC)   HCAP (healthcare-associated pneumonia)   Elevated Troponin   Sounds like she has acute on chronic diastolic HF from drinking too much water and maybe some extra salt on top of pneumonia.  She says her weight has been stable ~197#.  Troponin elevation is mild with flat trend and likely demand related from CHF; similar to Sept when she had a left heart cath was which showed moderate disease, which was the same as cath in January 2016.  Continue diuresis.  I/O's not accurate.  Follow weight.  With a decreased appetite over the last month, she likely has a new dry weight.       Tarri Fuller, [place] 04/25/2015, 10:41 AM

## 2015-04-26 ENCOUNTER — Inpatient Hospital Stay (HOSPITAL_COMMUNITY): Payer: Medicare Other

## 2015-04-26 ENCOUNTER — Other Ambulatory Visit: Payer: Self-pay | Admitting: Endocrinology

## 2015-04-26 DIAGNOSIS — I5033 Acute on chronic diastolic (congestive) heart failure: Secondary | ICD-10-CM

## 2015-04-26 DIAGNOSIS — I509 Heart failure, unspecified: Secondary | ICD-10-CM | POA: Insufficient documentation

## 2015-04-26 DIAGNOSIS — E1165 Type 2 diabetes mellitus with hyperglycemia: Secondary | ICD-10-CM

## 2015-04-26 DIAGNOSIS — E1121 Type 2 diabetes mellitus with diabetic nephropathy: Secondary | ICD-10-CM

## 2015-04-26 DIAGNOSIS — I4729 Other ventricular tachycardia: Secondary | ICD-10-CM

## 2015-04-26 DIAGNOSIS — I472 Ventricular tachycardia: Secondary | ICD-10-CM

## 2015-04-26 LAB — BASIC METABOLIC PANEL
ANION GAP: 10 (ref 5–15)
BUN: 21 mg/dL — ABNORMAL HIGH (ref 6–20)
CALCIUM: 8.7 mg/dL — AB (ref 8.9–10.3)
CO2: 30 mmol/L (ref 22–32)
Chloride: 101 mmol/L (ref 101–111)
Creatinine, Ser: 1.88 mg/dL — ABNORMAL HIGH (ref 0.44–1.00)
GFR calc Af Amer: 27 mL/min — ABNORMAL LOW (ref >60)
GFR, EST NON AFRICAN AMERICAN: 24 mL/min — AB (ref >60)
Glucose, Bld: 194 mg/dL — ABNORMAL HIGH (ref 65–99)
POTASSIUM: 3.6 mmol/L (ref 3.5–5.1)
SODIUM: 141 mmol/L (ref 135–145)

## 2015-04-26 LAB — GLUCOSE, CAPILLARY
GLUCOSE-CAPILLARY: 174 mg/dL — AB (ref 65–99)
GLUCOSE-CAPILLARY: 180 mg/dL — AB (ref 65–99)
Glucose-Capillary: 86 mg/dL (ref 65–99)
Glucose-Capillary: 117 mg/dL — ABNORMAL HIGH (ref 65–99)

## 2015-04-26 LAB — URINALYSIS, ROUTINE W REFLEX MICROSCOPIC
BILIRUBIN URINE: NEGATIVE
GLUCOSE, UA: NEGATIVE mg/dL
HGB URINE DIPSTICK: NEGATIVE
Ketones, ur: NEGATIVE mg/dL
Leukocytes, UA: NEGATIVE
Nitrite: NEGATIVE
Protein, ur: 30 mg/dL — AB
SPECIFIC GRAVITY, URINE: 1.01 (ref 1.005–1.030)
Urobilinogen, UA: 0.2 mg/dL (ref 0.0–1.0)
pH: 5 (ref 5.0–8.0)

## 2015-04-26 LAB — URINE MICROSCOPIC-ADD ON

## 2015-04-26 LAB — STREP PNEUMONIAE URINARY ANTIGEN: Strep Pneumo Urinary Antigen: NEGATIVE

## 2015-04-26 MED ORDER — BISOPROLOL FUMARATE 5 MG PO TABS
5.0000 mg | ORAL_TABLET | Freq: Every day | ORAL | Status: DC
  Administered 2015-04-26 – 2015-04-28 (×3): 5 mg via ORAL
  Filled 2015-04-26 (×3): qty 1

## 2015-04-26 MED ORDER — MENTHOL 3 MG MT LOZG
1.0000 | LOZENGE | OROMUCOSAL | Status: DC | PRN
  Administered 2015-04-26: 3 mg via ORAL
  Filled 2015-04-26: qty 9

## 2015-04-26 NOTE — Progress Notes (Signed)
Placed pt on cpap at this time, pt tolerating well

## 2015-04-26 NOTE — Progress Notes (Signed)
Pt monitor tech pt going in and out of a Junctional rhythm on the monitor and strip saved to monitor. Pt usually SB/SR. Pt asymptomatic, VSS. Pt requesting cough drops. K Schorr notified, new order for cough drops at this time.

## 2015-04-26 NOTE — Progress Notes (Signed)
[facility] PROGRESS NOTE    Progress Note   [name] NWG:[id number] DOB: 1930-03-30 DOA: 04/24/2015 PCP: Renato Shin, MD   Brief Narrative:   [name] is an 81 y.o. female patient with history of chronic combined CHF (EF 45-50 percent & grade 3 diastolic dysfunction by [facility] 02/2015), CAD, DM 2, HTN, HLD, OSA on nightly CPAP, PAD status post left BKA, stage III CKD, COPD/? Asthma, bradycardia (hence not on beta blockers), recent hospitalization 9/19-9/22 for unstable angina at which time cath showed stable anatomy, now presented to [facility] ED on 04/24/15 with worsening dyspnea, orthopnea, productive cough, pleuritic appearing chest pain but no fevers. Recently seen by PCP and started on azithromycin for suspected pneumonia & Lasix dose increased. Noncompliant with fluid and water restriction. Admitted for decompensated CHF &? Pneumonia.  Assessment/Plan:   Acute respiratory failure with hypoxia (HCC) due to   Acute on chronic diastolic heart failure [facility]): He showed an ejection fraction of 45% with grade 3 diastolic heart failure, patient has been noncompliant with fluid and salt restriction. Cardiology was consulted who recommended IV Lasix, no ACE inhibitor or ARB secondary to renal failure. He was started on BiDil. Poor I and O's  recorded, cardiology consult recommended to continue IV diuresis. Insert Foley catheter  Possible healthcare associated pneumonia: Started empirically on IV vancomycin and Fortaz. Pro-Calcitonin was low likely due to acute decompensated heart failure DC antibiotics.  Diabetes type 2 with renal complications: Continue 5784, continue check CBGs before meals and at bedtime.  Chronic kidney disease stage III: Also be new baseline 1.7-1.8.  Elevated troponins in the setting of Nonobstructive CAD: Likely due to demand ischemia without chest pain, we'll continue statin aspirin Plavix and Ranexa hydralazine and  Imdur.  Hyperlipidemia: Continue statin therapy.  Obstructive sleep apnea: Continues to benefit night.  History of gout: Continue Allopurinol     DVT Prophylaxis - Lovenox ordered.  Family Communication: husband Disposition Plan: Home when stable. Code Status:     Code Status Orders        Start     Ordered   04/24/15 2353  Full code   Continuous     04/24/15 2354        IV Access:    Peripheral IV   Procedures and diagnostic studies:   Dg Chest 2 View  04/26/2015  CLINICAL DATA:  Shortness of breath. EXAM: CHEST  2 VIEW COMPARISON:  04/24/2015. FINDINGS: Cardiomegaly with pulmonary vascular prominence and mild interstitial prominence, improved from prior exam. Findings consistent with improving congestive heart failure. Cardiomegaly remains severe. Underlying process such as pericardial effusion, cardiomyopathy, or cardiac valve disease cannot be excluded . No pleural effusion or pneumothorax . IMPRESSION: 1. Improving congestive heart failure. 2. Persistent severe cardiomegaly. Underlying process such as pericardial fusion, cardiomyopathy, cardiac valve disease cannot be excluded . Electronically Signed   By: Marcello Moores  Register   On: 04/26/2015 07:47   Dg Chest 2 View  04/24/2015  CLINICAL DATA:  Five day history of upper urinary tract infection. No improvement after antibiotics. Productive cough. EXAM: CHEST  2 VIEW COMPARISON:  04/19/2015 FINDINGS: The heart is enlarged but appears relatively stable. There is tortuosity and calcification of the thoracic aorta. Diffuse bilateral airspace process could reflect pulmonary edema or bilateral infiltrates. No definite pleural effusions. The bony thorax is intact. IMPRESSION: Cardiac enlargement. Diffuse airspace process could reflect pulmonary edema or bilateral infiltrates. No definite pleural effusions. Electronically Signed   By: Ricky Stabs.D.  On: 04/24/2015 17:16     Medical Consultants:     None.  Anti-Infectives:   Anti-infectives    Start     Dose/Rate Route Frequency Ordered Stop   04/25/15 2100  vancomycin (VANCOCIN) IVPB 1000 mg/200 mL premix     1,000 mg 200 mL/hr over 60 Minutes Intravenous Every 24 hours 04/24/15 2032     04/25/15 2030  cefTAZidime (FORTAZ) 2 g in dextrose 5 % 50 mL IVPB     2 g 100 mL/hr over 30 Minutes Intravenous Every 24 hours 04/24/15 2032     04/25/15 0000  cefTAZidime (FORTAZ) 2 g in dextrose 5 % 50 mL IVPB  Status:  Discontinued     2 g 100 mL/hr over 30 Minutes Intravenous 3 times per day 04/24/15 2354 04/25/15 0018   04/24/15 2030  cefTAZidime (FORTAZ) 2 g in dextrose 5 % 50 mL IVPB  Status:  Discontinued     2 g 100 mL/hr over 30 Minutes Intravenous  Once 04/24/15 2025 04/24/15 2032   04/24/15 2030  vancomycin (VANCOCIN) IVPB 1000 mg/200 mL premix  Status:  Discontinued     1,000 mg 200 mL/hr over 60 Minutes Intravenous  Once 04/24/15 2025 04/24/15 2032      Subjective:    [name] she relates her breathing is improved.  Objective:    Filed Vitals:   04/26/15 0032 04/26/15 0149 04/26/15 0639 04/26/15 1117  BP: 155/61  147/61 163/60  Pulse: 69  69 70  Temp: 98.1 F (36.7 C)  98.8 F (37.1 C) 99.3 F (37.4 C)  TempSrc: Oral  Oral Oral  Resp: 20  20 20   Height:      Weight:   88.6 kg (195 lb 5.2 oz)   SpO2: 99% 96% 95% 97%    Intake/Output Summary (Last 24 hours) at 04/26/15 1259 Last data filed at 04/26/15 0200  Gross per 24 hour  Intake    530 ml  Output      0 ml  Net    530 ml   Filed Weights   04/24/15 1805 04/24/15 2153 04/26/15 0639  Weight: 89.676 kg (197 lb 11.2 oz) 90.8 kg (200 lb 2.8 oz) 88.6 kg (195 lb 5.2 oz)    Exam: Gen:  NAD Cardiovascular:  RRR, No M/R/G Chest and lungs:   CTAB Abdomen:  Abdomen soft, NT/ND, + BS Extremities:  Right lower ext edema, Left BKA.   Data Reviewed:    Labs: Basic Metabolic Panel:  Recent Labs Lab 04/24/15 1738 04/25/15 0051  04/25/15 0708 04/26/15 0315  NA 141  --  142 141  K 4.4  --  3.5 3.6  CL 104  --  101 101  CO2 26  --  28 30  GLUCOSE 87  --  164* 194*  BUN 24*  --  21* 21*  CREATININE 2.05* 2.07* 1.94* 1.88*  CALCIUM 8.8*  --  8.7* 8.7*  MG  --   --  1.9  --    GFR Estimated Creatinine Clearance: 25.4 mL/min (by C-G formula based on Cr of 1.88). Liver Function Tests:  Recent Labs Lab 04/24/15 1738  AST 45*  ALT 32  ALKPHOS 111  BILITOT 1.0  PROT 6.3*  ALBUMIN 3.3*   No results for input(s): LIPASE, AMYLASE in the last 168 hours. No results for input(s): AMMONIA in the last 168 hours. Coagulation profile No results for input(s): INR, PROTIME in the last 168 hours.  CBC:  Recent Labs Lab  04/24/15 1738 04/25/15 0051 04/25/15 0708  WBC 6.8 6.6 7.7  NEUTROABS 5.0  --   --   HGB 10.5* 10.6* 10.8*  HCT 29.2* 30.6* 33.7*  MCV 92.1 88.7 86.2  PLT 365 308 PLATELET CLUMPS NOTED ON SMEAR, COUNT APPEARS ADEQUATE   Cardiac Enzymes:  Recent Labs Lab 04/24/15 1738 04/25/15 0051 04/25/15 0708 04/25/15 1203  TROPONINI 0.17* 0.16* 0.16* 0.15*   BNP (last 3 results)  Recent Labs  06/08/14 1617  PROBNP 1249.0*   CBG:  Recent Labs Lab 04/25/15 1126 04/25/15 1643 04/25/15 2143 04/26/15 0623 04/26/15 1236  GLUCAP 160* 237* 141* 174* 86   D-Dimer: No results for input(s): DDIMER in the last 72 hours. Hgb A1c: No results for input(s): HGBA1C in the last 72 hours. Lipid Profile: No results for input(s): CHOL, HDL, LDLCALC, TRIG, CHOLHDL, LDLDIRECT in the last 72 hours. Thyroid function studies: No results for input(s): TSH, T4TOTAL, T3FREE, THYROIDAB in the last 72 hours.  Invalid input(s): FREET3 Anemia work up: No results for input(s): VITAMINB12, FOLATE, FERRITIN, TIBC, IRON, RETICCTPCT in the last 72 hours. Sepsis Labs:  Recent Labs Lab 04/24/15 1738 04/24/15 1758 04/24/15 2059 04/25/15 0051 04/25/15 0708  PROCALCITON  --   --  <0.10  --   --   WBC 6.8  --    --  6.6 7.7  LATICACIDVEN  --  1.14  --   --   --    Microbiology Recent Results (from the past 240 hour(s))  Blood culture (routine x 2)     Status: None (Preliminary result)   Collection Time: 04/24/15  8:54 PM  Result Value Ref Range Status   Specimen Description BLOOD LEFT ANTECUBITAL  Final   Special Requests BOTTLES DRAWN AEROBIC AND ANAEROBIC 5CC   Final   Culture NO GROWTH 2 DAYS  Final   Report Status PENDING  Incomplete  Blood culture (routine x 2)     Status: None (Preliminary result)   Collection Time: 04/24/15  8:59 PM  Result Value Ref Range Status   Specimen Description BLOOD RIGHT ANTECUBITAL  Final   Special Requests BOTTLES DRAWN AEROBIC AND ANAEROBIC 5CC   Final   Culture NO GROWTH 2 DAYS  Final   Report Status PENDING  Incomplete  MRSA PCR Screening     Status: None   Collection Time: 04/24/15 10:51 PM  Result Value Ref Range Status   MRSA by PCR NEGATIVE NEGATIVE Final    Comment:        The GeneXpert MRSA Assay (FDA approved for NASAL specimens only), is one component of a comprehensive MRSA colonization surveillance program. It is not intended to diagnose MRSA infection nor to guide or monitor treatment for MRSA infections.      Medications:   . allopurinol  100 mg Oral Daily  . amLODipine  10 mg Oral Daily  . aspirin EC  81 mg Oral Daily  . atorvastatin  40 mg Oral q1800  . bisoprolol  5 mg Oral Daily  . calcitRIOL  0.25 mcg Oral Daily  . cefTAZidime (FORTAZ)  IV  2 g Intravenous Q24H  . clopidogrel  75 mg Oral Daily  . donepezil  5 mg Oral QHS  . enoxaparin (LOVENOX) injection  30 mg Subcutaneous QHS  . ezetimibe  10 mg Oral q morning - 10a  . feeding supplement (GLUCERNA SHAKE)  237 mL Oral q morning - 10a  . furosemide  40 mg Intravenous Q12H  . gabapentin  300 mg Oral  QHS  . hydrALAZINE  50 mg Oral TID  . insulin aspart protamine- aspart  14 Units Subcutaneous Q breakfast  . ipratropium-albuterol  3 mL Nebulization Q6H  . isosorbide  mononitrate  60 mg Oral Daily  . latanoprost  1 drop Both Eyes QHS  . mometasone-formoterol  2 puff Inhalation BID  . pantoprazole  40 mg Oral BID  . potassium chloride SA  10 mEq Oral Daily  . ranolazine  500 mg Oral BID  . sertraline  100 mg Oral Daily  . sodium chloride  3 mL Intravenous Q12H  . vancomycin  1,000 mg Intravenous Q24H   Continuous Infusions:   Time spent: 25 min   LOS: 2 days   Charlynne Cousins  [facility] Pager [phone]  *Please refer to [email or web address], password [email or web address] to get updated schedule on who will round on this patient, as hospitalists switch teams weekly. If 7PM-7AM, please contact night-coverage at [email or web address], password [email or web address] for any overnight needs.  04/26/2015, 12:59 PM

## 2015-04-26 NOTE — Evaluation (Signed)
Physical Therapy Evaluation Patient Details Name: [name] MRN: [id number] DOB: [birth year] Today's Date: 04/26/2015   History of Present Illness  [name] is a 81 y.o. female with history of nonobstructive CAD who has been recently admitted in September 2016 for unstable angina and at that time patient had unremarkable cardiac cath and patient had medications adjusted presents to the ER as patient has been having increasing shortness of breath over the last 1 week. Patient also has been having some productive cough with greenish sputum. Patient was placed on azithromycin by patient's primary care physician last week with increasing dose of Lasix from 40 mg daily to twice daily. Despite this patient has been getting short of breath even at rest. PMH includes L BKA, DM, heart failure, HTN, NSTEMI    Clinical Impression  Pt admitted with above diagnosis. Pt currently with functional limitations due to the deficits listed below (see PT Problem List).  Pt will benefit from skilled PT to increase their independence and safety with mobility to allow discharge to the venue listed below.    Pt did not have L prosthesis in room and with sit > stand attempt required MAX of 2 to achieve partial standing at RW.  Husband is going to bring prosthesis in.  [name] would like pt to return home and not go to rehab, but states "if I can't handle her, I'll let you know".  Based on today's session, I do feel that pt would benefit from short term SNF, as she was ambulatory with RW and L prosthesis at baseline and required +2 for partial stand. Will assess how pt does with prosthesis next visit and will continue to assess d/c plans.  Pt  Fatigued quickly with 2-3/4 dyspnea with o2 at 95%.        Follow Up Recommendations Home health PT;SNF;Supervision for mobility/OOB ( depends on progress)    Equipment Recommendations  None recommended by PT    Recommendations for Other Services        Precautions / Restrictions Precautions Precautions: Fall Precaution Comments: L BKA      Mobility  Bed Mobility Overal bed mobility: Needs Assistance Bed Mobility: Supine to Sit     Supine to sit: Min assist;HOB elevated     General bed mobility comments: Pt able to get almost fully to EOB before needing MIN A to get hips fully turned.  Heavy use of bed rail and HOB elevated.  Pt has difficulty WB through R LE to A with scooting in bed.  Multiple attempts to scoot forward and sideways and pt  needing in creased assistance.  Transfers Overall transfer level: Needs assistance Equipment used: Rolling walker (2 wheeled) Transfers: Sit to/from Stand Sit to Stand: +2 physical assistance;Max assist         General transfer comment: Pt stood with MAX of 2 ( on each side) to RW with R foot blocked and husband also holding down front of RW while wet pads removed from under pt.  She was unable to achieve full standing and is weak, but did not have prosthesis on.  Ambulation/Gait             General Gait Details: deferred due to no prosthesis  Stairs            Wheelchair Mobility    Modified Rankin (Stroke Patients Only)       Balance Overall balance assessment: Needs assistance   Sitting balance-Leahy Scale: Fair  Standing balance-Leahy Scale: Zero Standing balance comment: Pt does not have L prosthesis in room                             Pertinent Vitals/Pain Pain Assessment: No/denies pain    Home Living Family/patient expects to be discharged to:: Private residence Living Arrangements: Spouse/significant other Available Help at Discharge: Family Type of Home: House Home Access: Ramped entrance     Home Layout: One level Home Equipment: Environmental consultant - 2 wheels;Walker - 4 wheels;Wheelchair - manual;Shower seat - built in Additional Comments: Pt has standard bed, but has a rail on it    Prior Function Level of Independence: Independent  with assistive device(s)         Comments: I with RW and L prosthesis     Hand Dominance        Extremity/Trunk Assessment   Upper Extremity Assessment: Defer to OT evaluation           Lower Extremity Assessment: RLE deficits/detail;Generalized weakness;LLE deficits/detail   LLE Deficits / Details: L BKA  Cervical / Trunk Assessment: Normal  Communication   Communication: No difficulties  Cognition Arousal/Alertness: Awake/alert Behavior During Therapy: WFL for tasks assessed/performed Overall Cognitive Status: Within Functional Limits for tasks assessed                      General Comments General comments (skin integrity, edema, etc.): Husband and daughter present.  Husband is going to bring prosthesis in. Pt with 2-3/4 dyspnea on 2 L./min o2 with o2 sat 95%    Exercises        Assessment/Plan    PT Assessment Patient needs continued PT services  PT Diagnosis Difficulty walking;Generalized weakness   PT Problem List Decreased strength;Decreased activity tolerance;Decreased mobility;Decreased knowledge of use of DME;Cardiopulmonary status limiting activity  PT Treatment Interventions Gait training;Therapeutic activities;Functional mobility training;Therapeutic exercise;Balance training;Patient/family education   PT Goals (Current goals can be found in the Care Plan section) Acute Rehab PT Goals Patient Stated Goal: Husband would like pt to return home PT Goal Formulation: With patient/family Time For Goal Achievement: 05/10/15 Potential to Achieve Goals: Fair    Frequency Min 3X/week   Barriers to discharge        Co-evaluation               End of Session Equipment Utilized During Treatment: Oxygen Activity Tolerance: Patient limited by fatigue Patient left: in bed;with call bell/phone within reach;with bed alarm set;with family/visitor present;with nursing/sitter in room (nursing coming in to put in catheter) Nurse Communication:  Mobility status         Time: 6440-3474 PT Time Calculation (min) (ACUTE ONLY): 29 min   Charges:   PT Evaluation $Initial PT Evaluation Tier I: 1 Procedure PT Treatments $Therapeutic Activity: 8-22 mins   PT G Codes:        [name] 04/26/2015, 2:39 PM

## 2015-04-26 NOTE — Progress Notes (Signed)
DAILY PROGRESS NOTE  Subjective:  No recorded urine output overnight, but she says she is urinating "a lot". She has been incontinent - Weight is 195 lbs today. BP remains elevated. Creatinine has improved today. 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 13 beat run of NSVT noted overnight - not on b-blocker due to concern of asthma, up until this point.  Objective:  Temp:  [98 F (36.7 C)-99.3 F (37.4 C)] 99.3 F (37.4 C) (11/09 1117) Pulse Rate:  [64-70] 70 (11/09 1117) Resp:  [18-20] 20 (11/09 1117) BP: (143-163)/(54-65) 163/60 mmHg (11/09 1117) SpO2:  [93 %-100 %] 97 % (11/09 1117) Weight:  [195 lb 5.2 oz (88.6 kg)] 195 lb 5.2 oz (88.6 kg) (11/09 0639) Weight change: -2 lb 6 oz (-1.076 kg)  Intake/Output from previous day: 11/08 0701 - 11/09 0700 In: 650 [P.O.:600; IV Piggyback:50] Out: -   Intake/Output from this shift:    Medications: Current Facility-Administered Medications  Medication Dose Route Frequency Provider Last Rate Last Dose  . acetaminophen (TYLENOL) tablet 650 mg  650 mg Oral Q6H PRN Eduard Clos, MD       Or  . acetaminophen (TYLENOL) suppository 650 mg  650 mg Rectal Q6H PRN Eduard Clos, MD      . albuterol (PROVENTIL) (2.5 MG/3ML) 0.083% nebulizer solution 2.5 mg  2.5 mg Nebulization Q2H PRN Eduard Clos, MD      . allopurinol (ZYLOPRIM) tablet 100 mg  100 mg Oral Daily Eduard Clos, MD   100 mg at 04/26/15 1124  . amLODipine (NORVASC) tablet 10 mg  10 mg Oral Daily Eduard Clos, MD   10 mg at 04/26/15 1125  . aspirin EC tablet 81 mg  81 mg Oral Daily Eduard Clos, MD   81 mg at 04/26/15 1124  . atorvastatin (LIPITOR) tablet 40 mg  40 mg Oral q1800 Eduard Clos, MD   40 mg at 04/25/15 1723  . calcitRIOL (ROCALTROL) capsule 0.25 mcg  0.25 mcg Oral Daily Eduard Clos, MD   0.25 mcg at 04/26/15 1125  . cefTAZidime (FORTAZ) 2 g in dextrose 5 % 50 mL IVPB  2 g Intravenous Q24H Marquita Palms, RPH 100 mL/hr at 04/25/15 2153 2 g at 04/25/15 2153  . clopidogrel (PLAVIX) tablet 75 mg  75 mg Oral Daily Eduard Clos, MD   75 mg at 04/26/15 1125  . donepezil (ARICEPT) tablet 5 mg  5 mg Oral QHS Eduard Clos, MD   5 mg at 04/25/15 2243  . enoxaparin (LOVENOX) injection 30 mg  30 mg Subcutaneous QHS Elease Etienne, MD   30 mg at 04/25/15 2244  . ezetimibe (ZETIA) tablet 10 mg  10 mg Oral q morning - 10a Eduard Clos, MD   10 mg at 04/26/15 1124  . feeding supplement (GLUCERNA SHAKE) (GLUCERNA SHAKE) liquid 237 mL  237 mL Oral q morning - 10a Eduard Clos, MD   237 mL at 04/25/15 1000  . furosemide (LASIX) injection 40 mg  40 mg Intravenous Q12H Eduard Clos, MD   40 mg at 04/26/15 1126  . gabapentin (NEURONTIN) capsule 300 mg  300 mg Oral QHS Eduard Clos, MD   300 mg at 04/25/15 2242  . guaiFENesin (ROBITUSSIN) 100 MG/5ML solution 100 mg  5 mL Oral Q6H PRN Eduard Clos, MD   100 mg at 04/25/15 0427  . hydrALAZINE (APRESOLINE) tablet 50 mg  50  mg Oral TID Brett Canales, PA-C   50 mg at 04/26/15 1125  . HYDROcodone-acetaminophen (NORCO) 10-325 MG per tablet 1 tablet  1 tablet Oral Q6H PRN Rise Patience, MD      . insulin aspart protamine- aspart (NOVOLOG MIX 70/30) injection 14 Units  14 Units Subcutaneous Q breakfast Rise Patience, MD   14 Units at 04/26/15 [phone]  . ipratropium-albuterol (DUONEB) 0.5-2.5 (3) MG/3ML nebulizer solution 3 mL  3 mL Nebulization Q6H Rise Patience, MD   3 mL at 04/26/15 0149  . isosorbide mononitrate (IMDUR) 24 hr tablet 60 mg  60 mg Oral Daily Rise Patience, MD   60 mg at 04/26/15 1124  . latanoprost (XALATAN) 0.005 % ophthalmic solution 1 drop  1 drop Both Eyes QHS Rise Patience, MD   1 drop at 04/25/15 2255  . mometasone-formoterol (DULERA) 100-5 MCG/ACT inhaler 2 puff  2 puff Inhalation BID Rise Patience, MD   2 puff at 04/25/15 2023  . nitroGLYCERIN (NITROSTAT) SL tablet 0.4  mg  0.4 mg Sublingual Q5 min PRN Rise Patience, MD      . ondansetron [facility]) tablet 4 mg  4 mg Oral Q6H PRN Rise Patience, MD       Or  . ondansetron [facility]) injection 4 mg  4 mg Intravenous Q6H PRN Rise Patience, MD      . pantoprazole (PROTONIX) EC tablet 40 mg  40 mg Oral BID Rise Patience, MD   40 mg at 04/26/15 1125  . potassium chloride (K-DUR,KLOR-CON) CR tablet 10 mEq  10 mEq Oral Daily Rise Patience, MD   10 mEq at 04/26/15 1124  . ranolazine (RANEXA) 12 hr tablet 500 mg  500 mg Oral BID Rise Patience, MD   500 mg at 04/25/15 2243  . sertraline (ZOLOFT) tablet 100 mg  100 mg Oral Daily Rise Patience, MD   100 mg at 04/25/15 1225  . sodium chloride 0.9 % injection 3 mL  3 mL Intravenous Q12H Rise Patience, MD   3 mL at 04/26/15 1126  . vancomycin (VANCOCIN) IVPB 1000 mg/200 mL premix  1,000 mg Intravenous Q24H Rebecka Apley, RPH   1,000 mg at 04/25/15 2243    Physical Exam: General appearance: alert and no distress Lungs: dullness to percussion bilaterally and rhonchi bilaterally Heart: regular rate and rhythm, S1, S2 normal, no murmur, click, rub or gallop Extremities: edema 1+  Lab Results: Results for orders placed or performed during the hospital encounter of 04/24/15 (from the past 48 hour(s))  Comprehensive metabolic panel     Status: Abnormal   Collection Time: 04/24/15  5:38 PM  Result Value Ref Range   Sodium 141 135 - 145 mmol/L   Potassium 4.4 3.5 - 5.1 mmol/L   Chloride 104 101 - 111 mmol/L   CO2 26 22 - 32 mmol/L   Glucose, Bld 87 65 - 99 mg/dL   BUN 24 (H) 6 - 20 mg/dL   Creatinine, Ser 2.05 (H) 0.44 - 1.00 mg/dL   Calcium 8.8 (L) 8.9 - 10.3 mg/dL   Total Protein 6.3 (L) 6.5 - 8.1 g/dL   Albumin 3.3 (L) 3.5 - 5.0 g/dL   AST 45 (H) 15 - 41 U/L   ALT 32 14 - 54 U/L   Alkaline Phosphatase 111 38 - 126 U/L   Total Bilirubin 1.0 0.3 - 1.2 mg/dL   GFR calc non Af Amer 21 (L) >60  mL/min   GFR calc Af Amer 25 (L)  >60 mL/min    Comment: (NOTE) The eGFR has been calculated using the CKD EPI equation. This calculation has not been validated in all clinical situations. eGFR's persistently <60 mL/min signify possible Chronic Kidney Disease.    Anion gap 11 5 - 15  CBC with Differential     Status: Abnormal   Collection Time: 04/24/15  5:38 PM  Result Value Ref Range   WBC 6.8 4.0 - 10.5 K/uL   RBC 3.17 (L) 3.87 - 5.11 MIL/uL   Hemoglobin 10.5 (L) 12.0 - 15.0 g/dL   HCT 29.2 (L) 36.0 - 46.0 %   MCV 92.1 78.0 - 100.0 fL   MCH 33.1 26.0 - 34.0 pg   MCHC 36.0 30.0 - 36.0 g/dL   RDW 20.6 (H) 11.5 - 15.5 %   Platelets 365 150 - 400 K/uL   Neutrophils Relative % 73 %   Neutro Abs 5.0 1.7 - 7.7 K/uL   Lymphocytes Relative 13 %   Lymphs Abs 0.9 0.7 - 4.0 K/uL   Monocytes Relative 12 %   Monocytes Absolute 0.8 0.1 - 1.0 K/uL   Eosinophils Relative 2 %   Eosinophils Absolute 0.1 0.0 - 0.7 K/uL   Basophils Relative 0 %   Basophils Absolute 0.0 0.0 - 0.1 K/uL  Brain natriuretic peptide     Status: Abnormal   Collection Time: 04/24/15  5:38 PM  Result Value Ref Range   B Natriuretic Peptide 1594.7 (H) 0.0 - 100.0 pg/mL  Troponin I     Status: Abnormal   Collection Time: 04/24/15  5:38 PM  Result Value Ref Range   Troponin I 0.17 (H) <0.031 ng/mL    Comment:        PERSISTENTLY INCREASED TROPONIN VALUES IN THE RANGE OF 0.04-0.49 ng/mL CAN BE SEEN IN:       -UNSTABLE ANGINA       -CONGESTIVE HEART FAILURE       -MYOCARDITIS       -CHEST TRAUMA       -ARRYHTHMIAS       -LATE PRESENTING MYOCARDIAL INFARCTION       -COPD   CLINICAL FOLLOW-UP RECOMMENDED.   I-Stat CG4 Lactic Acid, ED     Status: None   Collection Time: 04/24/15  5:58 PM  Result Value Ref Range   Lactic Acid, Venous 1.14 0.5 - 2.0 mmol/L  Blood culture (routine x 2)     Status: None (Preliminary result)   Collection Time: 04/24/15  8:54 PM  Result Value Ref Range   Specimen Description BLOOD LEFT ANTECUBITAL    Special  Requests BOTTLES DRAWN AEROBIC AND ANAEROBIC 5CC     Culture NO GROWTH < 24 HOURS    Report Status PENDING   Procalcitonin     Status: None   Collection Time: 04/24/15  8:59 PM  Result Value Ref Range   Procalcitonin <0.10 ng/mL    Comment:        Interpretation: PCT (Procalcitonin) <= 0.5 ng/mL: Systemic infection (sepsis) is not likely. Local bacterial infection is possible. (NOTE)         ICU PCT Algorithm               Non ICU PCT Algorithm    ----------------------------     ------------------------------         PCT < 0.25 ng/mL  PCT < 0.1 ng/mL     Stopping of antibiotics            Stopping of antibiotics       strongly encouraged.               strongly encouraged.    ----------------------------     ------------------------------       PCT level decrease by               PCT < 0.25 ng/mL       >= 80% from peak PCT       OR PCT 0.25 - 0.5 ng/mL          Stopping of antibiotics                                             encouraged.     Stopping of antibiotics           encouraged.    ----------------------------     ------------------------------       PCT level decrease by              PCT >= 0.25 ng/mL       < 80% from peak PCT        AND PCT >= 0.5 ng/mL            Continuin g antibiotics                                              encouraged.       Continuing antibiotics            encouraged.    ----------------------------     ------------------------------     PCT level increase compared          PCT > 0.5 ng/mL         with peak PCT AND          PCT >= 0.5 ng/mL             Escalation of antibiotics                                          strongly encouraged.      Escalation of antibiotics        strongly encouraged.   Blood culture (routine x 2)     Status: None (Preliminary result)   Collection Time: 04/24/15  8:59 PM  Result Value Ref Range   Specimen Description BLOOD RIGHT ANTECUBITAL    Special Requests BOTTLES DRAWN AEROBIC AND  ANAEROBIC 5CC     Culture NO GROWTH < 24 HOURS    Report Status PENDING   Glucose, capillary     Status: Abnormal   Collection Time: 04/24/15 10:05 PM  Result Value Ref Range   Glucose-Capillary 153 (H) 65 - 99 mg/dL  MRSA PCR Screening     Status: None   Collection Time: 04/24/15 10:51 PM  Result Value Ref Range   MRSA by PCR NEGATIVE NEGATIVE    Comment:        The GeneXpert MRSA Assay (FDA approved for NASAL specimens only), is one component  of a comprehensive MRSA colonization surveillance program. It is not intended to diagnose MRSA infection nor to guide or monitor treatment for MRSA infections.   Troponin I (q 6hr x 3)     Status: Abnormal   Collection Time: 04/25/15 12:51 AM  Result Value Ref Range   Troponin I 0.16 (H) <0.031 ng/mL    Comment:        PERSISTENTLY INCREASED TROPONIN VALUES IN THE RANGE OF 0.04-0.49 ng/mL CAN BE SEEN IN:       -UNSTABLE ANGINA       -CONGESTIVE HEART FAILURE       -MYOCARDITIS       -CHEST TRAUMA       -ARRYHTHMIAS       -LATE PRESENTING MYOCARDIAL INFARCTION       -COPD   CLINICAL FOLLOW-UP RECOMMENDED.   HIV antibody     Status: None   Collection Time: 04/25/15 12:51 AM  Result Value Ref Range   HIV Screen 4th Generation wRfx Non Reactive Non Reactive    Comment: (NOTE) Performed At: [facility] [place], [place] [id number] Lindon Romp MD [id number]   CBC     Status: Abnormal   Collection Time: 04/25/15 12:51 AM  Result Value Ref Range   WBC 6.6 4.0 - 10.5 K/uL   RBC 3.45 (L) 3.87 - 5.11 MIL/uL   Hemoglobin 10.6 (L) 12.0 - 15.0 g/dL   HCT 30.6 (L) 36.0 - 46.0 %   MCV 88.7 78.0 - 100.0 fL   MCH 30.7 26.0 - 34.0 pg   MCHC 34.6 30.0 - 36.0 g/dL   RDW 19.7 (H) 11.5 - 15.5 %   Platelets 308 150 - 400 K/uL  Creatinine, serum     Status: Abnormal   Collection Time: 04/25/15 12:51 AM  Result Value Ref Range   Creatinine, Ser 2.07 (H) 0.44 - 1.00 mg/dL   GFR calc non Af Amer 21 (L) >60  mL/min   GFR calc Af Amer 24 (L) >60 mL/min    Comment: (NOTE) The eGFR has been calculated using the CKD EPI equation. This calculation has not been validated in all clinical situations. eGFR's persistently <60 mL/min signify possible Chronic Kidney Disease.   Glucose, capillary     Status: Abnormal   Collection Time: 04/25/15  6:10 AM  Result Value Ref Range   Glucose-Capillary 142 (H) 65 - 99 mg/dL   Comment 1 Notify RN    Comment 2 Document in Chart   Troponin I (q 6hr x 3)     Status: Abnormal   Collection Time: 04/25/15  7:08 AM  Result Value Ref Range   Troponin I 0.16 (H) <0.031 ng/mL    Comment:        PERSISTENTLY INCREASED TROPONIN VALUES IN THE RANGE OF 0.04-0.49 ng/mL CAN BE SEEN IN:       -UNSTABLE ANGINA       -CONGESTIVE HEART FAILURE       -MYOCARDITIS       -CHEST TRAUMA       -ARRYHTHMIAS       -LATE PRESENTING MYOCARDIAL INFARCTION       -COPD   CLINICAL FOLLOW-UP RECOMMENDED.   Basic metabolic panel     Status: Abnormal   Collection Time: 04/25/15  7:08 AM  Result Value Ref Range   Sodium 142 135 - 145 mmol/L   Potassium 3.5 3.5 - 5.1 mmol/L   Chloride 101 101 - 111 mmol/L   CO2  28 22 - 32 mmol/L   Glucose, Bld 164 (H) 65 - 99 mg/dL   BUN 21 (H) 6 - 20 mg/dL   Creatinine, Ser 1.94 (H) 0.44 - 1.00 mg/dL   Calcium 8.7 (L) 8.9 - 10.3 mg/dL   GFR calc non Af Amer 23 (L) >60 mL/min   GFR calc Af Amer 26 (L) >60 mL/min    Comment: (NOTE) The eGFR has been calculated using the CKD EPI equation. This calculation has not been validated in all clinical situations. eGFR's persistently <60 mL/min signify possible Chronic Kidney Disease.    Anion gap 13 5 - 15  CBC     Status: Abnormal   Collection Time: 04/25/15  7:08 AM  Result Value Ref Range   WBC 7.7 4.0 - 10.5 K/uL   RBC 3.91 3.87 - 5.11 MIL/uL   Hemoglobin 10.8 (L) 12.0 - 15.0 g/dL   HCT 33.7 (L) 36.0 - 46.0 %   MCV 86.2 78.0 - 100.0 fL   MCH 27.6 26.0 - 34.0 pg   MCHC 32.0 30.0 - 36.0 g/dL     RDW 19.1 (H) 11.5 - 15.5 %   Platelets  150 - 400 K/uL    PLATELET CLUMPS NOTED ON SMEAR, COUNT APPEARS ADEQUATE  Magnesium     Status: None   Collection Time: 04/25/15  7:08 AM  Result Value Ref Range   Magnesium 1.9 1.7 - 2.4 mg/dL  Glucose, capillary     Status: Abnormal   Collection Time: 04/25/15 11:26 AM  Result Value Ref Range   Glucose-Capillary 160 (H) 65 - 99 mg/dL   Comment 1 Notify RN   Influenza panel by PCR (type A & B, H1N1)     Status: None   Collection Time: 04/25/15 11:49 AM  Result Value Ref Range   Influenza A By PCR NEGATIVE NEGATIVE   Influenza B By PCR NEGATIVE NEGATIVE   H1N1 flu by pcr NOT DETECTED NOT DETECTED    Comment:        The Xpert Flu assay (FDA approved for nasal aspirates or washes and nasopharyngeal swab specimens), is intended as an aid in the diagnosis of influenza and should not be used as a sole basis for treatment.   Troponin I (q 6hr x 3)     Status: Abnormal   Collection Time: 04/25/15 12:03 PM  Result Value Ref Range   Troponin I 0.15 (H) <0.031 ng/mL    Comment:        PERSISTENTLY INCREASED TROPONIN VALUES IN THE RANGE OF 0.04-0.49 ng/mL CAN BE SEEN IN:       -UNSTABLE ANGINA       -CONGESTIVE HEART FAILURE       -MYOCARDITIS       -CHEST TRAUMA       -ARRYHTHMIAS       -LATE PRESENTING MYOCARDIAL INFARCTION       -COPD   CLINICAL FOLLOW-UP RECOMMENDED.   Glucose, capillary     Status: Abnormal   Collection Time: 04/25/15  4:43 PM  Result Value Ref Range   Glucose-Capillary 237 (H) 65 - 99 mg/dL   Comment 1 Notify RN   Glucose, capillary     Status: Abnormal   Collection Time: 04/25/15  9:43 PM  Result Value Ref Range   Glucose-Capillary 141 (H) 65 - 99 mg/dL  Basic metabolic panel     Status: Abnormal   Collection Time: 04/26/15  3:15 AM  Result Value Ref Range   Sodium 141  135 - 145 mmol/L   Potassium 3.6 3.5 - 5.1 mmol/L   Chloride 101 101 - 111 mmol/L   CO2 30 22 - 32 mmol/L   Glucose, Bld 194 (H) 65 - 99  mg/dL   BUN 21 (H) 6 - 20 mg/dL   Creatinine, Ser 1.88 (H) 0.44 - 1.00 mg/dL   Calcium 8.7 (L) 8.9 - 10.3 mg/dL   GFR calc non Af Amer 24 (L) >60 mL/min   GFR calc Af Amer 27 (L) >60 mL/min    Comment: (NOTE) The eGFR has been calculated using the CKD EPI equation. This calculation has not been validated in all clinical situations. eGFR's persistently <60 mL/min signify possible Chronic Kidney Disease.    Anion gap 10 5 - 15  Glucose, capillary     Status: Abnormal   Collection Time: 04/26/15  6:23 AM  Result Value Ref Range   Glucose-Capillary 174 (H) 65 - 99 mg/dL    Imaging: Dg Chest 2 View  04/26/2015  CLINICAL DATA:  Shortness of breath. EXAM: CHEST  2 VIEW COMPARISON:  04/24/2015. FINDINGS: Cardiomegaly with pulmonary vascular prominence and mild interstitial prominence, improved from prior exam. Findings consistent with improving congestive heart failure. Cardiomegaly remains severe. Underlying process such as pericardial effusion, cardiomyopathy, or cardiac valve disease cannot be excluded . No pleural effusion or pneumothorax . IMPRESSION: 1. Improving congestive heart failure. 2. Persistent severe cardiomegaly. Underlying process such as pericardial fusion, cardiomyopathy, cardiac valve disease cannot be excluded . Electronically Signed   By: Marcello Moores  Register   On: 04/26/2015 07:47   Dg Chest 2 View  04/24/2015  CLINICAL DATA:  Five day history of upper urinary tract infection. No improvement after antibiotics. Productive cough. EXAM: CHEST  2 VIEW COMPARISON:  04/19/2015 FINDINGS: The heart is enlarged but appears relatively stable. There is tortuosity and calcification of the thoracic aorta. Diffuse bilateral airspace process could reflect pulmonary edema or bilateral infiltrates. No definite pleural effusions. The bony thorax is intact. IMPRESSION: Cardiac enlargement. Diffuse airspace process could reflect pulmonary edema or bilateral infiltrates. No definite pleural effusions.  Electronically Signed   By: Marijo Sanes M.D.   On: 04/24/2015 17:16    Assessment:  1. Principal Problem: 2.   Acute respiratory failure with hypoxia (HCC) 3. Active Problems: 4.   DM (diabetes mellitus), type 2, uncontrolled, with renal complications ([place]) 5.   Hyperlipidemia 6.   Essential hypertension 7.   Coronary artery disease due to lipid rich plaque; 70% ostial-proximal AV Groove Cx - not good PCI target 8.   Acute on chronic diastolic heart failure ([place]) 9.   HCAP (healthcare-associated pneumonia) 61.   Plan:  1. Continue IV diuresis today. Place foley due to incontinence, concern for skin breakdown, difficulty transferring due to BKA and need for accurate quantification of I's/O's. Noted to have NSVT this morning which woke her up.  Will add low dose bisoprolol - monitor for worsening wheezing. On antibiotics for pneumonia - still with a rhonchorous cough.  Time Spent Directly with Patient:  15 minutes  Length of Stay:  LOS: 2 days   Pixie Casino, MD, [facility] Attending Cardiologist [place] 04/26/2015, 11:30 AM

## 2015-04-26 NOTE — Progress Notes (Signed)
OT Cancellation Note  Patient Details Name: SERAYA JOBST MRN: [id number] DOB: 08/06/1929   Cancelled Treatment:   OT eval completed, full note to follow.  Spouse is adamant that he is planning to take her home despite her current level of care.  He reports they have had to bad experiences with SNF level rehab, and it is not an option, currently, she would not tolerate 3 hours of therapy needed to qualify for CIR.  Recommend HHOT, HHPT, HHRN, HHaide, HHSW - CM aware.   Darlina Rumpf Veazie, OTR/L [phone]  04/26/2015, 3:24 PM

## 2015-04-26 NOTE — Evaluation (Signed)
Occupational Therapy Evaluation Patient Details Name: [name] MRN: [id number] DOB: 1930/06/01 Today's Date: 04/26/2015    History of Present Illness [name] is a 81 y.o. female with history of nonobstructive CAD who has been recently admitted in September 2016 for unstable angina and at that time patient had unremarkable cardiac cath and patient had medications adjusted presents to the ER as patient has been having increasing shortness of breath over the last 1 week. Patient also has been having some productive cough with greenish sputum. Patient was placed on azithromycin by patient's primary care physician last week with increasing dose of Lasix from 40 mg daily to twice daily. Despite this patient has been getting short of breath even at rest. PMH includes L BKA, DM, heart failure, HTN, NSTEMI   Clinical Impression   Pt admitted with above. She demonstrates the below listed deficits and will benefit from continued OT to maximize safety and independence with BADLs.  Pt presents to OT with generalized weakness and deconditioning.  Currently, she requires max - total A for ADLs and fatigues quickly.  Her spouse is determined to take her home as they have had bad experiences with SNF level rehab in the past, and he will not consider it an option.  Recommend HHOT, PT, aide, RN, SW, and possible hoyer lift (depending on how she progresses) for discharge. Will follow      Follow Up Recommendations  Home health OT;Supervision/Assistance - 24 hour;Other (comment) (HHaide, HHRN, HHSW)    Equipment Recommendations  Other (comment) (possible hoyer lift depending on progress )    Recommendations for Other Services       Precautions / Restrictions Precautions Precautions: Fall Precaution Comments: L BKA      Mobility Bed Mobility Overal bed mobility: Needs Assistance Bed Mobility: Supine to Sit     Supine to sit: Min assist;HOB elevated     General bed mobility  comments: Pt too fatigued to attempt  Transfers Overall transfer level: Needs assistance Equipment used: Rolling walker (2 wheeled) Transfers: Sit to/from Stand Sit to Stand: +2 physical assistance;Max assist         General transfer comment: Pt stood with MAX of 2 ( on each side) to RW with R foot blocked and husband also holding down front of RW while wet pads removed from under pt.  She was unable to achieve full standing and is weak, but did not have prosthesis on.    Balance Overall balance assessment: Needs assistance   Sitting balance-Leahy Scale: Fair       Standing balance-Leahy Scale: Zero Standing balance comment: Pt does not have L prosthesis in room                            ADL Overall ADL's : Needs assistance/impaired Eating/Feeding: Independent;Sitting   Grooming: Wash/dry hands;Wash/dry face;Oral care;Set up;Bed level   Upper Body Bathing: Moderate assistance;Bed level   Lower Body Bathing: Total assistance;Bed level   Upper Body Dressing : Total assistance;Sitting;Bed level   Lower Body Dressing: Bed level;Total assistance   Toilet Transfer: Total assistance (unable )   Toileting- Clothing Manipulation and Hygiene: Total assistance;Sit to/from stand       Functional mobility during ADLs: Maximal assistance;+2 for physical assistance General ADL Comments: Pt very deconditioned.       Vision     Perception     Praxis      Pertinent Vitals/Pain Pain Assessment: No/denies pain  Hand Dominance Right   Extremity/Trunk Assessment Upper Extremity Assessment Upper Extremity Assessment: Generalized weakness   Lower Extremity Assessment Lower Extremity Assessment: Defer to PT evaluation LLE Deficits / Details: L BKA   Cervical / Trunk Assessment Cervical / Trunk Assessment: Normal   Communication Communication Communication: No difficulties   Cognition Arousal/Alertness: Awake/alert Behavior During Therapy: WFL for  tasks assessed/performed Overall Cognitive Status: Within Functional Limits for tasks assessed                     General Comments       Exercises Exercises: Other exercises Other Exercises Other Exercises: Pt performed 10 reps shoulder flex/ext, elbow flex/ext and shoulder abd/add with minimal manual resistance    Shoulder Instructions      Home Living Family/patient expects to be discharged to:: Private residence Living Arrangements: Spouse/significant other Available Help at Discharge: Family Type of Home: House Home Access: Ramped entrance     Home Layout: One level     Bathroom Shower/Tub: Occupational psychologist: Handicapped height Bathroom Accessibility: Yes How Accessible: Accessible via walker Home Equipment: [place] - 2 wheels;Walker - 4 wheels;Wheelchair - manual;Shower seat - built in   Additional Comments: Pt has standard bed, but has a rail on it      Prior Functioning/Environment Level of Independence: Needs assistance  Gait / Transfers Assistance Needed: pt ambulated with supervision with RW from bedroom to kitchen and sometimes farther.  Used w/c for appointments ADL's / Homemaking Assistance Needed: Husband assists with LB bathing (bed bath), and LB dressing.  She moves sit to stand to pull up pants         OT Diagnosis: Generalized weakness   OT Problem List: Decreased strength;Decreased activity tolerance;Impaired balance (sitting and/or standing);Obesity   OT Treatment/Interventions: Self-care/ADL training;Therapeutic exercise;Therapeutic activities;Patient/family education;Balance training    OT Goals(Current goals can be found in the care plan section) Acute Rehab OT Goals Patient Stated Goal: to go home  OT Goal Formulation: With patient/family Time For Goal Achievement: 05/10/15 Potential to Achieve Goals: Good ADL Goals Pt Will Perform Grooming: with set-up;sitting Pt Will Perform Upper Body Bathing: with min  assist;sitting;bed level Pt Will Perform Upper Body Dressing: with min assist;sitting Pt Will Transfer to Toilet: with mod assist;stand pivot transfer;bedside commode Pt Will Perform Toileting - Clothing Manipulation and hygiene: with max assist;sit to/from stand Pt/caregiver will Perform Home Exercise Program: Increased strength;Both right and left upper extremity;With theraband;With written HEP provided;With Supervision  OT Frequency: Min 3X/week   Barriers to D/C:            Co-evaluation              End of Session Equipment Utilized During Treatment: Oxygen Nurse Communication: Mobility status  Activity Tolerance: Patient limited by fatigue Patient left: in bed;with bed alarm set;with family/visitor present;with call bell/phone within reach   Time: 6286-3817 OT Time Calculation (min): 28 min Charges:  OT Evaluation $Initial OT Evaluation Tier I: 1 Procedure OT Treatments $Therapeutic Exercise: 8-22 mins G-Codes:    [name] M 2015/05/19, 6:20 PM

## 2015-04-27 LAB — GLUCOSE, CAPILLARY
GLUCOSE-CAPILLARY: 159 mg/dL — AB (ref 65–99)
GLUCOSE-CAPILLARY: 223 mg/dL — AB (ref 65–99)
GLUCOSE-CAPILLARY: 236 mg/dL — AB (ref 65–99)
GLUCOSE-CAPILLARY: 208 mg/dL — AB (ref 65–99)

## 2015-04-27 LAB — BASIC METABOLIC PANEL
ANION GAP: 12 (ref 5–15)
BUN: 29 mg/dL — ABNORMAL HIGH (ref 6–20)
CALCIUM: 8.5 mg/dL — AB (ref 8.9–10.3)
CO2: 27 mmol/L (ref 22–32)
Chloride: 100 mmol/L — ABNORMAL LOW (ref 101–111)
Creatinine, Ser: 2.44 mg/dL — ABNORMAL HIGH (ref 0.44–1.00)
GFR, EST AFRICAN AMERICAN: 20 mL/min — AB (ref >60)
GFR, EST NON AFRICAN AMERICAN: 17 mL/min — AB (ref >60)
GLUCOSE: 155 mg/dL — AB (ref 65–99)
POTASSIUM: 4.9 mmol/L (ref 3.5–5.1)
Sodium: 139 mmol/L (ref 135–145)

## 2015-04-27 LAB — LEGIONELLA PNEUMOPHILA SEROGP 1 UR AG: L. pneumophila Serogp 1 Ur Ag: NEGATIVE

## 2015-04-27 NOTE — Progress Notes (Signed)
Physical Therapy Treatment Patient Details Name: [name] MRN: [id number] DOB: Feb 02, 1930 Today's Date: 04/27/2015    History of Present Illness [name] is a 81 y.o. female with history of nonobstructive CAD who has been recently admitted in September 2016 for unstable angina and at that time patient had unremarkable cardiac cath and patient had medications adjusted presents to the ER as patient has been having increasing shortness of breath over the last 1 week. Patient also has been having some productive cough with greenish sputum. Patient was placed on azithromycin by patient's primary care physician last week with increasing dose of Lasix from 40 mg daily to twice daily. Despite this patient has been getting short of breath even at rest. PMH includes L BKA, DM, heart failure, HTN, NSTEMI    PT Comments    Pt is improving dramatically despite not having her prosthesis, however still needing 2 person max to transition to chair.  Even with slide maneuver is 2 mod max, very difficult for her to position herself.  Husband may not be open to SNF placement but should be considered as she will struggle and will be a higher fall risk.  Follow Up Recommendations  SNF;Other (comment) (husband may decline but is 2 max assist)     Equipment Recommendations  None recommended by PT    Recommendations for Other Services Rehab consult     Precautions / Restrictions Precautions Precautions: Fall Precaution Comments: L BKA Restrictions Weight Bearing Restrictions: No    Mobility  Bed Mobility Overal bed mobility: Needs Assistance;+2 for physical assistance;+ 2 for safety/equipment Bed Mobility: Supine to Sit     Supine to sit: Mod assist;+2 for physical assistance;+2 for safety/equipment     General bed mobility comments: was able to assist with PT and OT to sit up and scoot forward but needs assist under trunk and to slide hips with bed pad  Transfers Overall  transfer level: Needs assistance Equipment used: Rolling walker (2 wheeled);2 person hand held assist Transfers: Sit to/from Stand Sit to Stand: +2 physical assistance;+2 safety/equipment;Mod assist;Max assist         General transfer comment: Pt able to assist with the effort but fatigues and has trouble controlling her RLE under her, slides a bit  Ambulation/Gait             General Gait Details: unable due to weakness and no prosthesis yet   Stairs            Wheelchair Mobility    Modified Rankin (Stroke Patients Only)       Balance Overall balance assessment: Needs assistance Sitting-balance support: Single extremity supported;Bilateral upper extremity supported Sitting balance-Leahy Scale: Fair     Standing balance support: Bilateral upper extremity supported Standing balance-Leahy Scale: Poor Standing balance comment: Pt is mainly struggling with RLE being weak without LLE prosthesis to support                    Cognition Arousal/Alertness: Awake/alert Behavior During Therapy: WFL for tasks assessed/performed Overall Cognitive Status: Within Functional Limits for tasks assessed                      Exercises      General Comments General comments (skin integrity, edema, etc.): Husband not here to outline her issues and request reconsideration of SNF to a better location than her last experience.      Pertinent Vitals/Pain Pain Assessment: No/denies pain  Home Living                      Prior Function            PT Goals (current goals can now be found in the care plan section) Acute Rehab PT Goals Patient Stated Goal: to go home  Progress towards PT goals: Progressing toward goals    Frequency  Min 3X/week    PT Plan Discharge plan needs to be updated    Co-evaluation PT/OT/SLP Co-Evaluation/Treatment: Yes Reason for Co-Treatment: Complexity of the patient's impairments (multi-system  involvement);Necessary to address cognition/behavior during functional activity;For patient/therapist safety PT goals addressed during session: Mobility/safety with mobility;Balance OT goals addressed during session: ADL's and self-care     End of Session Equipment Utilized During Treatment: Gait belt;Oxygen Activity Tolerance: Patient limited by fatigue;Patient limited by lethargy Patient left: in chair;with call bell/phone within reach;with chair alarm set;with nursing/sitter in room;Other (comment) (lift pad under her for nursing in case sliding too difficult)     Time: [id number] PT Time Calculation (min) (ACUTE ONLY): 24 min  Charges:  $Therapeutic Activity: 8-22 mins                    G Codes:      Ramond Dial 05-03-15, 10:34 AM   Mee Hives, PT MS Acute Rehab Dept. Number: [facility] [phone] and [place] [phone]

## 2015-04-27 NOTE — Progress Notes (Signed)
Pt stated she was not ready to go on cpap at this time, stated she would be ready around 2300. RT will place pt on cpap when pt ready

## 2015-04-27 NOTE — Progress Notes (Signed)
Occupational Therapy Treatment Patient Details Name: [name] MRN: [id number] DOB: 14-Nov-1929 Today's Date: 04/27/2015    History of present illness [name] is a 81 y.o. female with history of nonobstructive CAD who has been recently admitted in September 2016 for unstable angina and at that time patient had unremarkable cardiac cath and patient had medications adjusted presents to the ER as patient has been having increasing shortness of breath over the last 1 week. Patient also has been having some productive cough with greenish sputum. Patient was placed on azithromycin by patient's primary care physician last week with increasing dose of Lasix from 40 mg daily to twice daily. Despite this patient has been getting short of breath even at rest. PMH includes L BKA, DM, heart failure, HTN, NSTEMI   OT comments  Patient is making slow progress despite not having her prosthesis, but still requires +2 max A for sit to stand and transfer to a chair via sliding transfer. Feel SNF is safest discharge option; however patient and husband opposed to this and want to go home. If she goes home, she will need the equipment and home health services as outlined below. OT will continue to follow. Asked patient to have her husband bring prosthesis for optimal transfer training.  Follow Up Recommendations  Home health OT;Supervision/Assistance - 24 hour;Other (comment) (SNF would be safest discharge option but patient refusing)    Equipment Recommendations  Other (comment);Hospital bed (possible hoyer lift)    Recommendations for Other Services      Precautions / Restrictions Precautions Precautions: Fall Precaution Comments: L BKA Restrictions Weight Bearing Restrictions: No       Mobility Bed Mobility Overal bed mobility: Needs Assistance;+2 for physical assistance;+ 2 for safety/equipment Bed Mobility: Supine to Sit     Supine to sit: Mod assist;+2 for physical  assistance;+2 for safety/equipment     General bed mobility comments: was able to assist with PT and OT to sit up and scoot forward but needs assist under trunk and to slide hips with bed pad  Transfers Overall transfer level: Needs assistance Equipment used: Rolling walker (2 wheeled);2 person hand held assist Transfers: Sit to/from Stand;Lateral/Scoot Transfers Sit to Stand: +2 physical assistance;+2 safety/equipment;Max assist        Lateral/Scoot Transfers: +2 physical assistance;+2 safety/equipment;Mod assist;Max assist General transfer comment: Pt able to assist with the effort but fatigues and has trouble controlling her RLE under her, slides a bit    Balance                             ADL Overall ADL's : Needs assistance/impaired Eating/Feeding: Independent;Sitting   Grooming: Wash/dry hands;Wash/dry face;Set up;Sitting                               Functional mobility during ADLs: Maximal assistance;+2 for physical assistance General ADL Comments: Patient very deconditioned. Spouse has not brought in prosthesis yet. She required +2 max A for transfer sit to stand to RW and for sliding transfer to recliner. She usually does not require any physical assistance to get around at home but uses prosthesis and walker.       Vision                     Perception     Praxis      Cognition   Behavior During Therapy:  WFL for tasks assessed/performed Overall Cognitive Status: Within Functional Limits for tasks assessed                       Extremity/Trunk Assessment               Exercises     Shoulder Instructions       General Comments      Pertinent Vitals/ Pain       Pain Assessment: No/denies pain  Home Living                                          Prior Functioning/Environment              Frequency Min 3X/week     Progress Toward Goals  OT Goals(current goals can now be  found in the care plan section)  Progress towards OT goals: Progressing toward goals  Acute Rehab OT Goals Patient Stated Goal: to go home   Plan Other (comment) (refusing SNF but needs +2 max A for transfers at this time)    Co-evaluation    PT/OT/SLP Co-Evaluation/Treatment: Yes Reason for Co-Treatment: Complexity of the patient's impairments (multi-system involvement);For patient/therapist safety PT goals addressed during session: Mobility/safety with mobility;Proper use of DME;Balance OT goals addressed during session: ADL's and self-care;Proper use of Adaptive equipment and DME      End of Session Equipment Utilized During Treatment: Oxygen;Gait belt   Activity Tolerance Patient limited by fatigue   Patient Left in chair;with call bell/phone within reach;with chair alarm set;Other (comment)   Nurse Communication Mobility status;Need for lift equipment        Time: [id number] OT Time Calculation (min): 24 min  Charges: OT Treatments $Therapeutic Activity: 8-22 mins  Shalea Tomczak A 04/27/2015, 11:36 AM

## 2015-04-27 NOTE — Progress Notes (Signed)
Subjective: Breathing a little better  Objective: Vital signs in last 24 hours: Temp:  [97.9 F (36.6 C)-99.3 F (37.4 C)] 98.2 F (36.8 C) (11/10 0408) Pulse Rate:  [52-70] 52 (11/10 0408) Resp:  [18-20] 20 (11/10 0408) BP: (118-163)/(60-66) 145/60 mmHg (11/10 0408) SpO2:  [94 %-99 %] 99 % (11/10 0817) Weight:  [192 lb 3.9 oz (87.2 kg)] 192 lb 3.9 oz (87.2 kg) (11/10 0408) Last BM Date: 04/25/15  Intake/Output from previous day: 11/09 0701 - 11/10 0700 In: 910 [P.O.:910] Out: 300 [Urine:300] Intake/Output this shift:    Medications Scheduled Meds: . allopurinol  100 mg Oral Daily  . amLODipine  10 mg Oral Daily  . aspirin EC  81 mg Oral Daily  . atorvastatin  40 mg Oral q1800  . bisoprolol  5 mg Oral Daily  . calcitRIOL  0.25 mcg Oral Daily  . clopidogrel  75 mg Oral Daily  . donepezil  5 mg Oral QHS  . enoxaparin (LOVENOX) injection  30 mg Subcutaneous QHS  . ezetimibe  10 mg Oral q morning - 10a  . feeding supplement (GLUCERNA SHAKE)  237 mL Oral q morning - 10a  . furosemide  40 mg Intravenous Q12H  . gabapentin  300 mg Oral QHS  . hydrALAZINE  50 mg Oral TID  . insulin aspart protamine- aspart  14 Units Subcutaneous Q breakfast  . ipratropium-albuterol  3 mL Nebulization Q6H  . isosorbide mononitrate  60 mg Oral Daily  . latanoprost  1 drop Both Eyes QHS  . mometasone-formoterol  2 puff Inhalation BID  . pantoprazole  40 mg Oral BID  . potassium chloride SA  10 mEq Oral Daily  . ranolazine  500 mg Oral BID  . sertraline  100 mg Oral Daily  . sodium chloride  3 mL Intravenous Q12H   Continuous Infusions:  PRN Meds:.acetaminophen **OR** acetaminophen, albuterol, guaiFENesin, HYDROcodone-acetaminophen, menthol-cetylpyridinium, nitroGLYCERIN, ondansetron **OR** ondansetron (ZOFRAN) IV  PE: General appearance: alert, cooperative and no distress Lungs: R>L wheezing, +rhonchi Heart: regular rate and rhythm, S1, S2 normal, no murmur, click, rub or  gallop Abdomen: +BS, nontender Extremities: Trace right LEE Pulses: 2+ and symmetric Skin: WArm and dry Neurologic: Grossly normal  Lab Results:   Recent Labs  04/24/15 1738 04/25/15 0051 04/25/15 0708  WBC 6.8 6.6 7.7  HGB 10.5* 10.6* 10.8*  HCT 29.2* 30.6* 33.7*  PLT 365 308 PLATELET CLUMPS NOTED ON SMEAR, COUNT APPEARS ADEQUATE   BMET  Recent Labs  04/25/15 0708 04/26/15 0315 04/27/15 0357  NA 142 141 139  K 3.5 3.6 4.9  CL 101 101 100*  CO2 28 30 27   GLUCOSE 164* 194* 155*  BUN 21* 21* 29*  CREATININE 1.94* 1.88* 2.44*  CALCIUM 8.7* 8.7* 8.5*     Assessment/Plan  Principal Problem:   Acute respiratory failure with hypoxia (HCC) Active Problems:   DM (diabetes mellitus), type 2, uncontrolled, with renal complications (HCC)   Hyperlipidemia   Essential hypertension   Coronary artery disease due to lipid rich plaque; 70% ostial-proximal AV Groove Cx - not good PCI target   Acute on chronic diastolic heart failure (HCC)   HCAP (healthcare-associated pneumonia)   NSVT (nonsustained ventricular tachycardia) (HCC)   Acute on chronic congestive heart failure ([place])  Net fluids: Only 336ml of urine charted.  Foley cath was placed yesterday afternoon.  Wt 200>>195>>192.    SCr has increased 1.88 >> 2.44.  I do not think she is volume overload any more with  such a big jump in SCr.  Uine looks concentrated in foley bag.  Stop lasix.  Add PO lasix maybe tomorrow.  Strict I/O.  No VT in the last 24 hours.  Now on bisoprolol.   I think her main issue now is the COPD/HCAP.  She had a breathing treatment 30 mins ago and the wheezing is terrible still.     LOS: 3 days    Coleson Kant PA-C 04/27/2015 8:56 AM

## 2015-04-27 NOTE — Progress Notes (Signed)
[facility] PROGRESS NOTE    Progress Note   [name] [id number] DOB: Mar 26, 1930 DOA: 04/24/2015 PCP: Christina Shin, MD   Brief Narrative:   [name] is an 81 y.o. female patient with history of chronic combined CHF (EF 45-50 percent & grade 3 diastolic dysfunction by [facility] 02/2015), CAD, DM 2, HTN, HLD, OSA on nightly CPAP, PAD status post left BKA, stage III CKD, COPD/? Asthma, bradycardia (hence not on beta blockers), recent hospitalization 9/19-9/22 for unstable angina at which time cath showed stable anatomy, now presented to [facility] ED on 04/24/15 with worsening dyspnea, orthopnea, productive cough, pleuritic appearing chest pain but no fevers. Recently seen by PCP and started on azithromycin for suspected pneumonia & Lasix dose increased. Noncompliant with fluid and water restriction. Admitted for decompensated CHF &? Pneumonia.  Assessment/Plan:   Acute respiratory failure with hypoxia (HCC) due to   Acute on chronic diastolic heart failure [facility]): He showed an ejection fraction of 45% with grade 3 diastolic heart failure, patient has been noncompliant with fluid and salt restriction. Cardiology was consulted who recommended IV Lasix, cont BiDil. Mild rising creatinine, cardiology recommended to hold diuresis recheck a basic metabolic panel in the morning.  Possible healthcare associated pneumonia: Started empirically on IV vancomycin and Fortaz. He has remained afebrile with no leukocytosis. Pro-Calcitonin was low likely due to acute decompensated heart failure DC antibiotics.  Diabetes type 2 with renal complications: Continue [id number], continue check CBGs before meals and at bedtime.  AKI on Chronic kidney disease stage III: Also be new baseline 1.7-1.8. Likely due to over diuresis.  Elevated troponins in the setting of Nonobstructive CAD: Likely due to demand ischemia without chest pain, we'll continue statin aspirin Plavix and Ranexa hydralazine and  Imdur.  Hyperlipidemia: Continue statin therapy.  Obstructive sleep apnea: Continues to benefit night.  History of gout: Continue Allopurinol     DVT Prophylaxis - Lovenox ordered.  Family Communication: husband Disposition Plan: Home in am Code Status:     Code Status Orders        Start     Ordered   04/24/15 2353  Full code   Continuous     04/24/15 2354        IV Access:    Peripheral IV   Procedures and diagnostic studies:   Dg Chest 2 View  04/26/2015  CLINICAL DATA:  Shortness of breath. EXAM: CHEST  2 VIEW COMPARISON:  04/24/2015. FINDINGS: Cardiomegaly with pulmonary vascular prominence and mild interstitial prominence, improved from prior exam. Findings consistent with improving congestive heart failure. Cardiomegaly remains severe. Underlying process such as pericardial effusion, cardiomyopathy, or cardiac valve disease cannot be excluded . No pleural effusion or pneumothorax . IMPRESSION: 1. Improving congestive heart failure. 2. Persistent severe cardiomegaly. Underlying process such as pericardial fusion, cardiomyopathy, cardiac valve disease cannot be excluded . Electronically Signed   By: [name]  Register   On: 04/26/2015 07:47     Medical Consultants:    None.  Anti-Infectives:   Anti-infectives    Start     Dose/Rate Route Frequency Ordered Stop   04/25/15 2100  vancomycin (VANCOCIN) IVPB 1000 mg/200 mL premix  Status:  Discontinued     1,000 mg 200 mL/hr over 60 Minutes Intravenous Every 24 hours 04/24/15 2032 04/26/15 1302   04/25/15 2030  cefTAZidime (FORTAZ) 2 g in dextrose 5 % 50 mL IVPB  Status:  Discontinued     2 g 100 mL/hr over 30 Minutes Intravenous Every 24 hours  04/24/15 2032 04/26/15 1302   04/25/15 0000  cefTAZidime (FORTAZ) 2 g in dextrose 5 % 50 mL IVPB  Status:  Discontinued     2 g 100 mL/hr over 30 Minutes Intravenous 3 times per day 04/24/15 2354 04/25/15 0018   04/24/15 2030  cefTAZidime (FORTAZ) 2 g in dextrose 5  % 50 mL IVPB  Status:  Discontinued     2 g 100 mL/hr over 30 Minutes Intravenous  Once 04/24/15 2025 04/24/15 2032   04/24/15 2030  vancomycin (VANCOCIN) IVPB 1000 mg/200 mL premix  Status:  Discontinued     1,000 mg 200 mL/hr over 60 Minutes Intravenous  Once 04/24/15 2025 04/24/15 2032      Subjective:    [name]  she relates her breathing is at baseline, she relates she actually feels better than yesterday.  Objective:    Filed Vitals:   04/27/15 0149 04/27/15 0408 04/27/15 0817 04/27/15 1029  BP:  145/60  155/55  Pulse:  52    Temp:  98.2 F (36.8 C)    TempSrc:  Oral    Resp:  20    Height:      Weight:  87.2 kg (192 lb 3.9 oz)    SpO2: 99% 94% 99%     Intake/Output Summary (Last 24 hours) at 04/27/15 1158 Last data filed at 04/27/15 0859  Gross per 24 hour  Intake   1030 ml  Output    300 ml  Net    730 ml   Filed Weights   04/24/15 2153 04/26/15 0639 04/27/15 0408  Weight: 90.8 kg (200 lb 2.8 oz) 88.6 kg (195 lb 5.2 oz) 87.2 kg (192 lb 3.9 oz)    Exam: Gen:  NAD Cardiovascular:  RRR, No M/R/G Chest and lungs:   CTAB Abdomen:  Abdomen soft, NT/ND, + BS Extremities:  No lower extremity edema, Left BKA.   Data Reviewed:    Labs: Basic Metabolic Panel:  Recent Labs Lab 04/24/15 1738 04/25/15 0051 04/25/15 0708 04/26/15 0315 04/27/15 0357  NA 141  --  142 141 139  K 4.4  --  3.5 3.6 4.9  CL 104  --  101 101 100*  CO2 26  --  28 30 27   GLUCOSE 87  --  164* 194* 155*  BUN 24*  --  21* 21* 29*  CREATININE 2.05* 2.07* 1.94* 1.88* 2.44*  CALCIUM 8.8*  --  8.7* 8.7* 8.5*  MG  --   --  1.9  --   --    GFR Estimated Creatinine Clearance: 19.4 mL/min (by C-G formula based on Cr of 2.44). Liver Function Tests:  Recent Labs Lab 04/24/15 1738  AST 45*  ALT 32  ALKPHOS 111  BILITOT 1.0  PROT 6.3*  ALBUMIN 3.3*   No results for input(s): LIPASE, AMYLASE in the last 168 hours. No results for input(s): AMMONIA in the last 168  hours. Coagulation profile No results for input(s): INR, PROTIME in the last 168 hours.  CBC:  Recent Labs Lab 04/24/15 1738 04/25/15 0051 04/25/15 0708  WBC 6.8 6.6 7.7  NEUTROABS 5.0  --   --   HGB 10.5* 10.6* 10.8*  HCT 29.2* 30.6* 33.7*  MCV 92.1 88.7 86.2  PLT 365 308 PLATELET CLUMPS NOTED ON SMEAR, COUNT APPEARS ADEQUATE   Cardiac Enzymes:  Recent Labs Lab 04/24/15 1738 04/25/15 0051 04/25/15 0708 04/25/15 1203  TROPONINI 0.17* 0.16* 0.16* 0.15*   BNP (last 3 results)  Recent Labs  06/08/14 1617  PROBNP 1249.0*   CBG:  Recent Labs Lab 04/26/15 0623 04/26/15 1236 04/26/15 1625 04/26/15 2103 04/27/15 0611  GLUCAP 174* 86 117* 180* 159*   D-Dimer: No results for input(s): DDIMER in the last 72 hours. Hgb A1c: No results for input(s): HGBA1C in the last 72 hours. Lipid Profile: No results for input(s): CHOL, HDL, LDLCALC, TRIG, CHOLHDL, LDLDIRECT in the last 72 hours. Thyroid function studies: No results for input(s): TSH, T4TOTAL, T3FREE, THYROIDAB in the last 72 hours.  Invalid input(s): FREET3 Anemia work up: No results for input(s): VITAMINB12, FOLATE, FERRITIN, TIBC, IRON, RETICCTPCT in the last 72 hours. Sepsis Labs:  Recent Labs Lab 04/24/15 1738 04/24/15 1758 04/24/15 2059 04/25/15 0051 04/25/15 0708  PROCALCITON  --   --  <0.10  --   --   WBC 6.8  --   --  6.6 7.7  LATICACIDVEN  --  1.14  --   --   --    Microbiology Recent Results (from the past 240 hour(s))  Blood culture (routine x 2)     Status: None (Preliminary result)   Collection Time: 04/24/15  8:54 PM  Result Value Ref Range Status   Specimen Description BLOOD LEFT ANTECUBITAL  Final   Special Requests BOTTLES DRAWN AEROBIC AND ANAEROBIC 5CC   Final   Culture NO GROWTH 2 DAYS  Final   Report Status PENDING  Incomplete  Blood culture (routine x 2)     Status: None (Preliminary result)   Collection Time: 04/24/15  8:59 PM  Result Value Ref Range Status   Specimen  Description BLOOD RIGHT ANTECUBITAL  Final   Special Requests BOTTLES DRAWN AEROBIC AND ANAEROBIC 5CC   Final   Culture NO GROWTH 2 DAYS  Final   Report Status PENDING  Incomplete  MRSA PCR Screening     Status: None   Collection Time: 04/24/15 10:51 PM  Result Value Ref Range Status   MRSA by PCR NEGATIVE NEGATIVE Final    Comment:        The GeneXpert MRSA Assay (FDA approved for NASAL specimens only), is one component of a comprehensive MRSA colonization surveillance program. It is not intended to diagnose MRSA infection nor to guide or monitor treatment for MRSA infections.      Medications:   . allopurinol  100 mg Oral Daily  . amLODipine  10 mg Oral Daily  . aspirin EC  81 mg Oral Daily  . atorvastatin  40 mg Oral q1800  . bisoprolol  5 mg Oral Daily  . calcitRIOL  0.25 mcg Oral Daily  . clopidogrel  75 mg Oral Daily  . donepezil  5 mg Oral QHS  . enoxaparin (LOVENOX) injection  30 mg Subcutaneous QHS  . ezetimibe  10 mg Oral q morning - 10a  . feeding supplement (GLUCERNA SHAKE)  237 mL Oral q morning - 10a  . gabapentin  300 mg Oral QHS  . hydrALAZINE  50 mg Oral TID  . insulin aspart protamine- aspart  14 Units Subcutaneous Q breakfast  . ipratropium-albuterol  3 mL Nebulization Q6H  . isosorbide mononitrate  60 mg Oral Daily  . latanoprost  1 drop Both Eyes QHS  . mometasone-formoterol  2 puff Inhalation BID  . pantoprazole  40 mg Oral BID  . ranolazine  500 mg Oral BID  . sertraline  100 mg Oral Daily  . sodium chloride  3 mL Intravenous Q12H   Continuous Infusions:   Time spent: 25 min  LOS: 3 days   Charlynne Cousins  [facility] Pager [phone]  *Please refer to [email or web address], password [email or web address] to get updated schedule on who will round on this patient, as hospitalists switch teams weekly. If 7PM-7AM, please contact night-coverage at [email or web address], password [email or web address] for any overnight needs.  04/27/2015, 11:58 AM

## 2015-04-27 NOTE — Care Management Important Message (Signed)
Important Message  Patient Details  Name: [name] MRN: [id number] Date of Birth: 11/23/1929   Medicare Important Message Given:  Yes    Eliasar Hlavaty P Carr Shartzer 04/27/2015, 3:19 PM

## 2015-04-28 ENCOUNTER — Inpatient Hospital Stay (HOSPITAL_COMMUNITY): Payer: Medicare Other

## 2015-04-28 ENCOUNTER — Other Ambulatory Visit: Payer: Self-pay | Admitting: Endocrinology

## 2015-04-28 LAB — GLUCOSE, CAPILLARY
GLUCOSE-CAPILLARY: 343 mg/dL — AB (ref 65–99)
Glucose-Capillary: 182 mg/dL — ABNORMAL HIGH (ref 65–99)
Glucose-Capillary: 187 mg/dL — ABNORMAL HIGH (ref 65–99)
Glucose-Capillary: 278 mg/dL — ABNORMAL HIGH (ref 65–99)

## 2015-04-28 LAB — CBC
HEMATOCRIT: 27.8 % — AB (ref 36.0–46.0)
Hemoglobin: 9.8 g/dL — ABNORMAL LOW (ref 12.0–15.0)
MCH: 31.5 pg (ref 26.0–34.0)
MCHC: 35.3 g/dL (ref 30.0–36.0)
MCV: 89.4 fL (ref 78.0–100.0)
Platelets: 333 10*3/uL (ref 150–400)
RBC: 3.11 MIL/uL — ABNORMAL LOW (ref 3.87–5.11)
RDW: 20.1 % — AB (ref 11.5–15.5)
WBC: 10.5 10*3/uL (ref 4.0–10.5)

## 2015-04-28 LAB — BASIC METABOLIC PANEL
ANION GAP: 12 (ref 5–15)
BUN: 45 mg/dL — AB (ref 6–20)
CALCIUM: 8.6 mg/dL — AB (ref 8.9–10.3)
CO2: 28 mmol/L (ref 22–32)
Chloride: 97 mmol/L — ABNORMAL LOW (ref 101–111)
Creatinine, Ser: 3.1 mg/dL — ABNORMAL HIGH (ref 0.44–1.00)
GFR calc Af Amer: 15 mL/min — ABNORMAL LOW (ref >60)
GFR calc non Af Amer: 13 mL/min — ABNORMAL LOW (ref >60)
GLUCOSE: 206 mg/dL — AB (ref 65–99)
Potassium: 4.2 mmol/L (ref 3.5–5.1)
Sodium: 137 mmol/L (ref 135–145)

## 2015-04-28 MED ORDER — LEVOFLOXACIN IN D5W 500 MG/100ML IV SOLN: 500.0000 mg | INTRAVENOUS | Status: DC

## 2015-04-28 MED ORDER — BISOPROLOL FUMARATE 5 MG PO TABS
2.5000 mg | ORAL_TABLET | Freq: Every day | ORAL | Status: DC
  Administered 2015-04-29: 2.5 mg via ORAL
  Filled 2015-04-28: qty 1

## 2015-04-28 MED ORDER — VANCOMYCIN HCL IN DEXTROSE 1-5 GM/200ML-% IV SOLN
1000.0000 mg | Freq: Once | INTRAVENOUS | Status: AC
  Administered 2015-04-28: 1000 mg via INTRAVENOUS
  Filled 2015-04-28: qty 200

## 2015-04-28 MED ORDER — SODIUM CHLORIDE 0.9 % IV BOLUS (SEPSIS)
250.0000 mL | Freq: Once | INTRAVENOUS | Status: AC
  Administered 2015-04-28: 250 mL via INTRAVENOUS

## 2015-04-28 MED ORDER — VANCOMYCIN HCL IN DEXTROSE 1-5 GM/200ML-% IV SOLN
1000.0000 mg | INTRAVENOUS | Status: DC
  Filled 2015-04-28: qty 200

## 2015-04-28 MED ORDER — LEVOFLOXACIN IN D5W 750 MG/150ML IV SOLN
750.0000 mg | Freq: Once | INTRAVENOUS | Status: AC
  Administered 2015-04-28: 750 mg via INTRAVENOUS
  Filled 2015-04-28: qty 150

## 2015-04-28 MED ORDER — DEXTROSE 5 % IV SOLN
2.0000 g | INTRAVENOUS | Status: DC
  Administered 2015-04-28 – 2015-04-29 (×2): 2 g via INTRAVENOUS
  Filled 2015-04-28 (×3): qty 2

## 2015-04-28 MED ORDER — METHYLPREDNISOLONE SODIUM SUCC 125 MG IJ SOLR
80.0000 mg | Freq: Two times a day (BID) | INTRAMUSCULAR | Status: DC
  Administered 2015-04-28 – 2015-04-29 (×3): 80 mg via INTRAVENOUS
  Filled 2015-04-28 (×3): qty 2

## 2015-04-28 NOTE — Progress Notes (Signed)
ANTIBIOTIC CONSULT NOTE - INITIAL  Pharmacy Consult for Vanco/Fortaz/Levaquin Indication: HCAP  Allergies  Allergen Reactions  . Iohexol Itching and Rash     Code: RASH, Desc: [place] ON PT'S CHART ALLERGIC TO IV DYE 09/04/07/RM, Onset Date: [id number]     Patient Measurements: Height: 5\' 6"  (167.6 cm) Weight: 193 lb 12.6 oz (87.9 kg) (bedscale) IBW/kg (Calculated) : 59.3 Adjusted Body Weight:   Vital Signs: Temp: 98 F (36.7 C) (11/11 1222) Temp Source: Oral (11/11 1222) BP: 130/59 mmHg (11/11 1222) Pulse Rate: 91 (11/11 1222) Intake/Output from previous day: 11/10 0701 - 11/11 0700 In: 480 [P.O.:480] Out: 350 [Urine:350] Intake/Output from this shift: Total I/O In: 120 [P.O.:120] Out: -   Labs:  Recent Labs  04/26/15 0315 04/27/15 0357 04/28/15 0620  WBC  --   --  10.5  HGB  --   --  9.8*  PLT  --   --  333  CREATININE 1.88* 2.44* 3.10*   Estimated Creatinine Clearance: 15.3 mL/min (by C-G formula based on Cr of 3.1). No results for input(s): VANCOTROUGH, VANCOPEAK, VANCORANDOM, GENTTROUGH, GENTPEAK, GENTRANDOM, TOBRATROUGH, TOBRAPEAK, TOBRARND, AMIKACINPEAK, AMIKACINTROU, AMIKACIN in the last 72 hours.   Microbiology:   Medical History: Past Medical History  Diagnosis Date  . Uterine cancer ([place])   . Diabetes mellitus     type II; peripheral neuropathy  . Hypertension   . GERD (gastroesophageal reflux disease)   . Peripheral vascular disease ([place])     a. s/p L BKA 08/2012.  . Obesity   . Dyslipidemia   . Chronic combined systolic and diastolic CHF (congestive heart failure) (HCC)     a. EF 50-55%, mild LVH and grade 1 diast. Dysfxn b. Grade 3 Diastolic Dysfunction [id number]. b. 02/2015: EF 45-50% by cath.  . Osteoarthritis cervical spine   . DM retinopathy ([place])   . Sleep apnea     with CPAP  . History of shingles   . COPD (chronic obstructive pulmonary disease) ([place])   . Depression   . Peptic ulcer disease     duodenal  . Peptic stricture  of esophagus   . Hiatal hernia   . Diverticulosis   . Arthritis   . CAD (coronary artery disease)     a. s/p multiple caths with nonobs CAD;   b. cath 1/10: pLAD 20%, mLAD 40%, pCFX 20%, mCFX 40%, pRCA 60-70%;   c.  Myoview 06/02/12: Low anterior wall scar, no ischemia, EF 37%. d. cath 06/20/2014 70% mid LCx, medical therapy, high risk for PCI due to close proximity to large OM e. cath 03/07/15 pro RCA 50% & pro to mid Cx 75%, both stable, LVEDP 33-60mm Hg-> medical management  . Cardiomyopathy ([place])     a. Echo 05/31/12: EF 40-45%. b. Echo 06/2014: EF 50-55%. c. Cath 02/2015: EF 45-50%.  . Asthma     Mild  . Pruritic condition     Idiopathic  . Zoster   . Noncompliance   . CKD (chronic kidney disease), stage III     Dr. Lorrene Reid  . Bradycardia     a. Not on BB due to this.  . Shortness of breath dyspnea     Medications:  Prescriptions prior to admission  Medication Sig Dispense Refill Last Dose  . acetaminophen (TYLENOL) 500 MG tablet Take 500 mg by mouth every 6 (six) hours as needed for mild pain.   04/24/2015 at Unknown time  . albuterol (PROVENTIL HFA;VENTOLIN HFA) 108 (90 BASE) MCG/ACT inhaler Inhale 2  puffs into the lungs every 4 (four) hours as needed for wheezing or shortness of breath. 1 Inhaler 3 04/24/2015 at Unknown time  . albuterol (PROVENTIL) (2.5 MG/3ML) 0.083% nebulizer solution Take 3 mLs (2.5 mg total) by nebulization every 4 (four) hours as needed for wheezing or shortness of breath. 75 mL 12 04/24/2015 at Unknown time  . allopurinol (ZYLOPRIM) 100 MG tablet TAKE 1 TABLET BY MOUTH DAILY 30 tablet 0 04/24/2015 at Unknown time  . aspirin EC 81 MG tablet Take 1 tablet (81 mg total) by mouth daily. 90 tablet 3 04/24/2015 at Unknown time  . calcitRIOL (ROCALTROL) 0.25 MCG capsule TAKE ONE CAPSULE BY MOUTH EVERY DAY 30 capsule 0 04/24/2015 at Unknown time  . clopidogrel (PLAVIX) 75 MG tablet Take 75 mg by mouth daily.   04/24/2015 at Unknown time  . donepezil (ARICEPT) 5 MG tablet  Take 1 tablet (5 mg total) by mouth at bedtime. 30 tablet 11 04/23/2015 at Unknown time  . feeding supplement (GLUCERNA SHAKE) LIQD Take 237 mLs by mouth every morning.    04/24/2015 at Unknown time  . Fluticasone-Salmeterol (ADVAIR DISKUS) 100-50 MCG/DOSE AEPB Inhale 1 puff into the lungs 2 (two) times daily. 1 each 11 04/24/2015 at Unknown time  . furosemide (LASIX) 40 MG tablet Take 1 tablet (40 mg total) by mouth daily. (Patient taking differently: Take 40 mg by mouth 2 (two) times daily. ) 30 tablet 11 04/24/2015 at Unknown time  . gabapentin (NEURONTIN) 300 MG capsule TAKE ONE CAPSULE BY MOUTH EVERY NIGHT AT BEDTIME 30 capsule 0 04/23/2015 at Unknown time  . HYDROcodone-acetaminophen (NORCO) 10-325 MG tablet Take 1 tablet by mouth every 6 (six) hours as needed for moderate pain.   04/23/2015 at Unknown time  . insulin NPH-regular Human (NOVOLIN 70/30) (70-30) 100 UNIT/ML injection Inject 14 Units into the skin daily with breakfast. Sliding scale. Only takes in the morning 10 mL 11 04/24/2015 at Unknown time  . isosorbide mononitrate (IMDUR) 60 MG 24 hr tablet Take 60 mg by mouth daily.  5 04/24/2015 at Unknown time  . latanoprost (XALATAN) 0.005 % ophthalmic solution Place 1 drop into both eyes at bedtime.   04/23/2015 at Unknown time  . nitroGLYCERIN (NITROSTAT) 0.4 MG SL tablet Place 0.4 mg under the tongue every 5 (five) minutes as needed for chest pain.    02/2015  . potassium chloride SA (K-DUR,KLOR-CON) 10 MEQ tablet Take 1 tablet (10 mEq total) by mouth daily. 30 tablet 3 04/24/2015 at Unknown time  . ranolazine (RANEXA) 500 MG 12 hr tablet Take 1 tablet (500 mg total) by mouth 2 (two) times daily. 60 tablet 3 04/24/2015 at Unknown time  . ZETIA 10 MG tablet TAKE 1 TABLET BY MOUTH EVERY MORNING 30 tablet 0 04/24/2015 at Unknown time  . azithromycin (ZITHROMAX) 500 MG tablet Take 1 tablet (500 mg total) by mouth daily. (Patient not taking: Reported on 04/24/2015) 5 tablet 0 Completed Course at 04/24/15   . isosorbide-hydrALAZINE (BIDIL) 20-37.5 MG tablet Take 1 tablet by mouth 3 (three) times daily. 90 tablet 0 Taking  . ONE TOUCH ULTRA TEST test strip USE TO TEST BLOOD SUGAR TWICE DAILY. 100 each 3 Taking   Assessment: 81 y/o F with recent hospitalization 9/19--9/22 for USAP. Now with worsening dyspnea, orthopnea, productive cough, pleuritic appearing chest pain but no fevers. Recently treated with Azithromycin. Patient as CHF but is noncompliant with fluid and water restrictions. Admitted fir decompensated CHF vs PNA.  Anticoagulation: Lovenox 30mg /d  Infectious Disease:  Empiric abx for HCAP. Elevated Scr. WBC 10.5 Vanco  11/7-11/9, 11/11>> Tressie Ellis 11/7-11/9, 11/11>> Levaquin 11/11>>  11/7 BC x 2: still pending?  Cardiovascular:HTN, HLD, CAD, CHF (EF 45-50%), PVD, CHF with noncompliance, elevated troponins, HLD. Holding Lasix due to rising Scr. Meds: Norvasc10, Lipitor40, ASA81, bisoprolol, Plavix, Zetia, Hydralazine, Imdur, Ranexa  Endocrinology: DM (A1c 6.7%) on 70/30 insulin, gout on Allopurinol  Gastrointestinal / Nutrition: GERD po PPI  Neurology: Spinal OA, depression, h/o noncompliance, Aricept, Gabapentin, Zoloft  Nephrology: AoCKD stage 3. Scr 1.94>1.88>2.44>3.1, Rocaltrol  Pulmonary: OSA, COPD, . Solumedrol, Cecilia  Hematology / Oncology: h/o uterine cancer,  ACD  PTA Medication Issues: Advair (use Dulera), Lasix (hold), K+  Best Practices: LMWH, po PPI  Goal of Therapy:  Vancomycin trough level 15-20 mcg/ml  Plan:  Fortaz 2g IV q24h Levaquin 750mg  IV x 1 then 500mg  IV q48h Vanco 1g IV q48h.   Alford Highland, [employer] 04/28/2015,12:40 PM

## 2015-04-28 NOTE — Care Management Note (Signed)
Case Management Note  Patient Details  Name: [name] MRN: [id number] Date of Birth: [birth year]  Subjective/Objective:      Admitted with Acute Resp Failure              Action/Plan: Patient lives at home with spouse, refused SNF placement and request to return home with [facility] services provided by [place]. Mary with [facility] called for arrangements. No DME needed, patient has a walker, cane and wheelchair at home. Has private insurance with [employer] with prescription drug coverage; pharmacy of choice is [facility].  Expected Discharge Date:    possibly 04/29/2015              Expected Discharge Plan:  [place]  Discharge planning Services  CM Consult Choice offered to:  Patient  HH Arranged:  RN, PT, OT, Nurse's Aide, Social Work [employer] Agency:  [place]  Status of Service:  In process, will continue to follow  Medicare Important Message Given:  Yes  Sherrilyn Rist [phone] 04/28/2015, 10:12 AM

## 2015-04-28 NOTE — Progress Notes (Signed)
[facility] PROGRESS NOTE    Progress Note   [name] [id number] DOB: Nov 06, 1929 DOA: 04/24/2015 PCP: Renato Shin, MD   Brief Narrative:   [name] is an 81 y.o. female patient with history of chronic combined CHF (EF 45-50 percent & grade 3 diastolic dysfunction by [facility] 02/2015), CAD, DM 2, HTN, HLD, OSA on nightly CPAP, PAD status post left BKA, stage III CKD, COPD/? Asthma, bradycardia (hence not on beta blockers), recent hospitalization 9/19-9/22 for unstable angina at which time cath showed stable anatomy, now presented to [facility] ED on 04/24/15 with worsening dyspnea, orthopnea, productive cough, pleuritic appearing chest pain but no fevers. Recently seen by PCP and started on azithromycin for suspected pneumonia & Lasix dose increased. Noncompliant with fluid and water restriction. Admitted for decompensated CHF &? Pneumonia.  Assessment/Plan:   Acute respiratory failure with hypoxia (HCC) due to   Acute on chronic diastolic heart failure [facility]): He showed an ejection fraction of 45% with grade 3 diastolic heart failure, patient has been noncompliant with fluid and salt restriction. Cardiology was consulted. There's been a mild increase in her creatinine again today, we'll continue to hold Lasix there is small fluid bolus. She feels short of breath today.  Possible healthcare associated pneumonia: Started empirically on IV vancomycin and Fortaz, these were held as she remained afebrile with no leukocytosis. On 04/28/2015 she started wheezing and complaining of cough with eating. I'll place her nothing by mouth start her on IV Solu-Medrol and empiric antibiotic vancomycin and Fortaz and check a swallowing evaluation for aspiration.  Diabetes type 2 with renal complications: Continue current insulin dose.  AKI on Chronic kidney disease stage III: Also be new baseline 1.7-1.8. There continues to be a rise in her creatinine, we'll give her a small bolus  of normal saline.  Elevated troponins in the setting of Nonobstructive CAD: Likely due to demand ischemia without chest pain, we'll continue statin aspirin Plavix and Ranexa hydralazine and Imdur.  Hyperlipidemia: Continue statin therapy.  Obstructive sleep apnea: Continues to benefit night.  History of gout: Continue Allopurinol     DVT Prophylaxis - Lovenox ordered.  Family Communication: husband Disposition Plan: Home in am Code Status:     Code Status Orders        Start     Ordered   04/24/15 2353  Full code   Continuous     04/24/15 2354        IV Access:    Peripheral IV   Procedures and diagnostic studies:   No results found.   Medical Consultants:    None.  Anti-Infectives:   Anti-infectives    Start     Dose/Rate Route Frequency Ordered Stop   04/28/15 1045  levofloxacin (LEVAQUIN) IVPB 500 mg     500 mg 100 mL/hr over 60 Minutes Intravenous Every 24 hours 04/28/15 1034     04/25/15 2100  vancomycin (VANCOCIN) IVPB 1000 mg/200 mL premix  Status:  Discontinued     1,000 mg 200 mL/hr over 60 Minutes Intravenous Every 24 hours 04/24/15 2032 04/26/15 1302   04/25/15 2030  cefTAZidime (FORTAZ) 2 g in dextrose 5 % 50 mL IVPB  Status:  Discontinued     2 g 100 mL/hr over 30 Minutes Intravenous Every 24 hours 04/24/15 2032 04/26/15 1302   04/25/15 0000  cefTAZidime (FORTAZ) 2 g in dextrose 5 % 50 mL IVPB  Status:  Discontinued     2 g 100 mL/hr over 30 Minutes Intravenous  3 times per day 04/24/15 2354 04/25/15 0018   04/24/15 2030  cefTAZidime (FORTAZ) 2 g in dextrose 5 % 50 mL IVPB  Status:  Discontinued     2 g 100 mL/hr over 30 Minutes Intravenous  Once 04/24/15 2025 04/24/15 2032   04/24/15 2030  vancomycin (VANCOCIN) IVPB 1000 mg/200 mL premix  Status:  Discontinued     1,000 mg 200 mL/hr over 60 Minutes Intravenous  Once 04/24/15 2025 04/24/15 2032      Subjective:    [name] she relates she has been coughing with  eating, she feels tired and weak with a productive cough today.  Objective:    Filed Vitals:   04/28/15 0219 04/28/15 0602 04/28/15 0933 04/28/15 1000  BP:  129/61  130/105  Pulse:  84 66 62  Temp:  98.4 F (36.9 C)    TempSrc:  Oral    Resp:  20 22 16   Height:      Weight:  87.9 kg (193 lb 12.6 oz)    SpO2: 100% 95% 92% 99%    Intake/Output Summary (Last 24 hours) at 04/28/15 1035 Last data filed at 04/28/15 0937  Gross per 24 hour  Intake    360 ml  Output    350 ml  Net     10 ml   Filed Weights   04/26/15 0639 04/27/15 0408 04/28/15 0602  Weight: 88.6 kg (195 lb 5.2 oz) 87.2 kg (192 lb 3.9 oz) 87.9 kg (193 lb 12.6 oz)    Exam: Gen:  NAD Cardiovascular:  RRR, No M/R/G Chest and lungs:   Good air movement with wheezing bilaterally crackles on the right. Abdomen:  Abdomen soft, NT/ND, + BS Extremities:  No lower extremity edema, Left BKA.   Data Reviewed:    Labs: Basic Metabolic Panel:  Recent Labs Lab 04/24/15 1738 04/25/15 0051 04/25/15 0708 04/26/15 0315 04/27/15 0357 04/28/15 0620  NA 141  --  142 141 139 137  K 4.4  --  3.5 3.6 4.9 4.2  CL 104  --  101 101 100* 97*  CO2 26  --  28 30 27 28   GLUCOSE 87  --  164* 194* 155* 206*  BUN 24*  --  21* 21* 29* 45*  CREATININE 2.05* 2.07* 1.94* 1.88* 2.44* 3.10*  CALCIUM 8.8*  --  8.7* 8.7* 8.5* 8.6*  MG  --   --  1.9  --   --   --    GFR Estimated Creatinine Clearance: 15.3 mL/min (by C-G formula based on Cr of 3.1). Liver Function Tests:  Recent Labs Lab 04/24/15 1738  AST 45*  ALT 32  ALKPHOS 111  BILITOT 1.0  PROT 6.3*  ALBUMIN 3.3*   No results for input(s): LIPASE, AMYLASE in the last 168 hours. No results for input(s): AMMONIA in the last 168 hours. Coagulation profile No results for input(s): INR, PROTIME in the last 168 hours.  CBC:  Recent Labs Lab 04/24/15 1738 04/25/15 0051 04/25/15 0708 04/28/15 0620  WBC 6.8 6.6 7.7 10.5  NEUTROABS 5.0  --   --   --   HGB 10.5*  10.6* 10.8* 9.8*  HCT 29.2* 30.6* 33.7* 27.8*  MCV 92.1 88.7 86.2 89.4  PLT 365 308 PLATELET CLUMPS NOTED ON SMEAR, COUNT APPEARS ADEQUATE 333   Cardiac Enzymes:  Recent Labs Lab 04/24/15 1738 04/25/15 0051 04/25/15 0708 04/25/15 1203  TROPONINI 0.17* 0.16* 0.16* 0.15*   BNP (last 3 results)  Recent Labs  06/08/14 1617  PROBNP 1249.0*   CBG:  Recent Labs Lab 04/27/15 0611 04/27/15 1134 04/27/15 1619 04/27/15 2057 04/28/15 0614  GLUCAP 159* 223* 236* 208* 182*   D-Dimer: No results for input(s): DDIMER in the last 72 hours. Hgb A1c: No results for input(s): HGBA1C in the last 72 hours. Lipid Profile: No results for input(s): CHOL, HDL, LDLCALC, TRIG, CHOLHDL, LDLDIRECT in the last 72 hours. Thyroid function studies: No results for input(s): TSH, T4TOTAL, T3FREE, THYROIDAB in the last 72 hours.  Invalid input(s): FREET3 Anemia work up: No results for input(s): VITAMINB12, FOLATE, FERRITIN, TIBC, IRON, RETICCTPCT in the last 72 hours. Sepsis Labs:  Recent Labs Lab 04/24/15 1738 04/24/15 1758 04/24/15 2059 04/25/15 0051 04/25/15 0708 04/28/15 0620  PROCALCITON  --   --  <0.10  --   --   --   WBC 6.8  --   --  6.6 7.7 10.5  LATICACIDVEN  --  1.14  --   --   --   --    Microbiology Recent Results (from the past 240 hour(s))  Blood culture (routine x 2)     Status: None (Preliminary result)   Collection Time: 04/24/15  8:54 PM  Result Value Ref Range Status   Specimen Description BLOOD LEFT ANTECUBITAL  Final   Special Requests BOTTLES DRAWN AEROBIC AND ANAEROBIC 5CC   Final   Culture NO GROWTH 3 DAYS  Final   Report Status PENDING  Incomplete  Blood culture (routine x 2)     Status: None (Preliminary result)   Collection Time: 04/24/15  8:59 PM  Result Value Ref Range Status   Specimen Description BLOOD RIGHT ANTECUBITAL  Final   Special Requests BOTTLES DRAWN AEROBIC AND ANAEROBIC 5CC   Final   Culture NO GROWTH 3 DAYS  Final   Report Status  PENDING  Incomplete  MRSA PCR Screening     Status: None   Collection Time: 04/24/15 10:51 PM  Result Value Ref Range Status   MRSA by PCR NEGATIVE NEGATIVE Final    Comment:        The GeneXpert MRSA Assay (FDA approved for NASAL specimens only), is one component of a comprehensive MRSA colonization surveillance program. It is not intended to diagnose MRSA infection nor to guide or monitor treatment for MRSA infections.      Medications:   . allopurinol  100 mg Oral Daily  . amLODipine  10 mg Oral Daily  . aspirin EC  81 mg Oral Daily  . atorvastatin  40 mg Oral q1800  . bisoprolol  5 mg Oral Daily  . calcitRIOL  0.25 mcg Oral Daily  . clopidogrel  75 mg Oral Daily  . donepezil  5 mg Oral QHS  . enoxaparin (LOVENOX) injection  30 mg Subcutaneous QHS  . ezetimibe  10 mg Oral q morning - 10a  . feeding supplement (GLUCERNA SHAKE)  237 mL Oral q morning - 10a  . gabapentin  300 mg Oral QHS  . hydrALAZINE  50 mg Oral TID  . insulin aspart protamine- aspart  14 Units Subcutaneous Q breakfast  . ipratropium-albuterol  3 mL Nebulization Q6H  . isosorbide mononitrate  60 mg Oral Daily  . latanoprost  1 drop Both Eyes QHS  . levofloxacin (LEVAQUIN) IV  500 mg Intravenous Q24H  . methylPREDNISolone (SOLU-MEDROL) injection  80 mg Intravenous Q12H  . mometasone-formoterol  2 puff Inhalation BID  . pantoprazole  40 mg Oral BID  . ranolazine  500 mg  Oral BID  . sertraline  100 mg Oral Daily  . sodium chloride  3 mL Intravenous Q12H   Continuous Infusions:   Time spent: 25 min   LOS: 4 days   Charlynne Cousins  [facility] Pager [phone]  *Please refer to [email or web address], password [email or web address] to get updated schedule on who will round on this patient, as hospitalists switch teams weekly. If 7PM-7AM, please contact night-coverage at [email or web address], password [email or web address] for any overnight needs.  04/28/2015, 10:35 AM

## 2015-04-28 NOTE — Progress Notes (Signed)
Subjective: No complaints. Her breathing is a lot better.   Objective: Vital signs in last 24 hours: Temp:  [98 F (36.7 C)-98.9 F (37.2 C)] 98 F (36.7 C) (11/11 1222) Pulse Rate:  [62-91] 67 (11/11 1317) Resp:  [16-22] 18 (11/11 1317) BP: (112-130)/(59-105) 130/59 mmHg (11/11 1222) SpO2:  [92 %-100 %] 97 % (11/11 1317) Weight:  [193 lb 12.6 oz (87.9 kg)] 193 lb 12.6 oz (87.9 kg) (11/11 0602) Last BM Date: 04/25/15  Intake/Output from previous day: 11/10 0701 - 11/11 0700 In: 480 [P.O.:480] Out: 350 [Urine:350] Intake/Output this shift: Total I/O In: 120 [P.O.:120] Out: -   Medications Scheduled Meds: . allopurinol  100 mg Oral Daily  . amLODipine  10 mg Oral Daily  . aspirin EC  81 mg Oral Daily  . atorvastatin  40 mg Oral q1800  . bisoprolol  5 mg Oral Daily  . calcitRIOL  0.25 mcg Oral Daily  . cefTAZidime (FORTAZ)  IV  2 g Intravenous Q24H  . clopidogrel  75 mg Oral Daily  . donepezil  5 mg Oral QHS  . enoxaparin (LOVENOX) injection  30 mg Subcutaneous QHS  . ezetimibe  10 mg Oral q morning - 10a  . feeding supplement (GLUCERNA SHAKE)  237 mL Oral q morning - 10a  . gabapentin  300 mg Oral QHS  . hydrALAZINE  50 mg Oral TID  . insulin aspart protamine- aspart  14 Units Subcutaneous Q breakfast  . ipratropium-albuterol  3 mL Nebulization Q6H  . isosorbide mononitrate  60 mg Oral Daily  . latanoprost  1 drop Both Eyes QHS  . [START ON 04/30/2015] levofloxacin (LEVAQUIN) IV  500 mg Intravenous Q48H  . levofloxacin (LEVAQUIN) IV  750 mg Intravenous Once  . methylPREDNISolone (SOLU-MEDROL) injection  80 mg Intravenous Q12H  . mometasone-formoterol  2 puff Inhalation BID  . pantoprazole  40 mg Oral BID  . ranolazine  500 mg Oral BID  . sertraline  100 mg Oral Daily  . sodium chloride  3 mL Intravenous Q12H  . [START ON 04/30/2015] vancomycin  1,000 mg Intravenous Q48H   Continuous Infusions:  PRN Meds:.acetaminophen **OR** acetaminophen, albuterol,  guaiFENesin, HYDROcodone-acetaminophen, menthol-cetylpyridinium, nitroGLYCERIN, ondansetron **OR** ondansetron (ZOFRAN) IV  PE: General appearance: alert, cooperative and no distress Lungs: R>L wheezing, +rhonchi Heart: regular rate and rhythm, S1, S2 normal, no murmur, click, rub or gallop Abdomen: +BS, nontender Extremities: Trace right LEE Pulses: 2+ and symmetric Skin: WArm and dry Neurologic: Grossly normal  Lab Results:   Recent Labs  04/28/15 0620  WBC 10.5  HGB 9.8*  HCT 27.8*  PLT 333   BMET  Recent Labs  04/26/15 0315 04/27/15 0357 04/28/15 0620  NA 141 139 137  K 3.6 4.9 4.2  CL 101 100* 97*  CO2 30 27 28   GLUCOSE 194* 155* 206*  BUN 21* 29* 45*  CREATININE 1.88* 2.44* 3.10*  CALCIUM 8.7* 8.5* 8.6*     Assessment/Plan  Principal Problem:   Acute respiratory failure with hypoxia (HCC) Active Problems:   DM (diabetes mellitus), type 2, uncontrolled, with renal complications (HCC)   Hyperlipidemia   Essential hypertension   Coronary artery disease due to lipid rich plaque; 70% ostial-proximal AV Groove Cx - not good PCI target   Acute on chronic diastolic heart failure (HCC)   HCAP (healthcare-associated pneumonia)   NSVT (nonsustained ventricular tachycardia) (HCC)   Acute on chronic congestive heart failure ([place])    Foley cath was placed 04/26/15  Wt  200>>195>>192>>193.    SCr increased 1.88 >> 2.44 yesterday and lasix discontinued. Now creat up to 3.10. Will continue to hold off on PO lasix with further increase in creat.  Strict I/O. Up 2 L.    No VT in the last 24 hours.  Now on bisoprolol. She does have very slow rates in 40s asymptomatic. Sometimes do not see P waves. MD to review.  I think her main issue now is the COPD/HCAP.  She had a breathing treatment 30 mins ago and the wheezing/rhonchi still pronounced.    LOS: 4 days    Eileen Stanford PA-C 04/28/2015 3:39 PM

## 2015-04-28 NOTE — Evaluation (Signed)
Clinical/Bedside Swallow Evaluation Patient Details  Name: [name] MRN: [id number] Date of Birth: 1930/04/30  Today's Date: 04/28/2015 Time: SLP Start Time (ACUTE ONLY): 1330 SLP Stop Time (ACUTE ONLY): 1348 SLP Time Calculation (min) (ACUTE ONLY): 18 min  Past Medical History:  Past Medical History  Diagnosis Date  . Uterine cancer ([place])   . Diabetes mellitus     type II; peripheral neuropathy  . Hypertension   . GERD (gastroesophageal reflux disease)   . Peripheral vascular disease ([place])     a. s/p L BKA 08/2012.  . Obesity   . Dyslipidemia   . Chronic combined systolic and diastolic CHF (congestive heart failure) (HCC)     a. EF 50-55%, mild LVH and grade 1 diast. Dysfxn b. Grade 3 Diastolic Dysfunction [id number]. b. 02/2015: EF 45-50% by cath.  . Osteoarthritis cervical spine   . DM retinopathy ([place])   . Sleep apnea     with CPAP  . History of shingles   . COPD (chronic obstructive pulmonary disease) ([place])   . Depression   . Peptic ulcer disease     duodenal  . Peptic stricture of esophagus   . Hiatal hernia   . Diverticulosis   . Arthritis   . CAD (coronary artery disease)     a. s/p multiple caths with nonobs CAD;   b. cath 1/10: pLAD 20%, mLAD 40%, pCFX 20%, mCFX 40%, pRCA 60-70%;   c.  Myoview 06/02/12: Low anterior wall scar, no ischemia, EF 37%. d. cath 06/20/2014 70% mid LCx, medical therapy, high risk for PCI due to close proximity to large OM e. cath 03/07/15 pro RCA 50% & pro to mid Cx 75%, both stable, LVEDP 33-55mm Hg-> medical management  . Cardiomyopathy ([place])     a. Echo 05/31/12: EF 40-45%. b. Echo 06/2014: EF 50-55%. c. Cath 02/2015: EF 45-50%.  . Asthma     Mild  . Pruritic condition     Idiopathic  . Zoster   . Noncompliance   . CKD (chronic kidney disease), stage III     Dr. Lorrene Reid  . Bradycardia     a. Not on BB due to this.  . Shortness of breath dyspnea    Past Surgical History:  Past Surgical History  Procedure Laterality Date  .  Tubal ligation  1967  . Knee arthroscopy  10/1998    Left  . Craniotomy  1997    Left for SDH  . Cataract extraction, bilateral  2005  . Hernia repair    . Esophagogastroduodenoscopy  04/04/2004  . Spine surgery      C-spine and lumbar surgery  . Cholecystectomy  2010  . Cardiac catheterization    . Dexa  7/05  . Amputation Left 09/04/2012    Procedure: AMPUTATION BELOW KNEE;  Surgeon: Angelia Mould, MD;  Location: [facility];  Service: Vascular;  Laterality: Left;  . Abdominal angiogram N/A 08/31/2012    Procedure: ABDOMINAL ANGIOGRAM with runoff poss intervention;  Surgeon: Angelia Mould, MD;  Location: [facility] CATH LAB;  Service: Cardiovascular;  Laterality: N/A;  . Tubal ligation    . Left heart catheterization with coronary angiogram N/A 06/22/2014    Procedure: LEFT HEART CATHETERIZATION WITH CORONARY ANGIOGRAM;  Surgeon: Burnell Blanks, MD;  Location: [facility] CATH LAB;  Service: Cardiovascular;  Laterality: N/A;  . Eye surgery    . Brain surgery      1997 blood clot on the brain, then had to relieve fluid on the  brain  . Multiple extractions with alveoloplasty N/A 08/01/2014    Procedure: EXTRACTIONS OF TEETH NUMBERS 7 8 9 10 11  AND 19 AND ALVEOLOPLASTY UPPER LEFT AND RIGHT QUADRANT;  Surgeon: Isac Caddy, DDS;  Location: [facility];  Service: Oral Surgery;  Laterality: N/A;  . Cardiac catheterization N/A 03/07/2015    Procedure: Left Heart Cath and Coronary Angiography;  Surgeon: Leonie Man, MD;  Location: [facility] CV LAB;  Service: Cardiovascular;  Laterality: N/A;   HPI:  [name] is an 81 y.o. female patient with history of chronic combined CHF (EF 45-50 percent & grade 3 diastolic dysfunction by [facility] 02/2015), CAD, DM 2, HTN, HLD, OSA on nightly CPAP, PAD status post left BKA, stage III CKD, COPD/? Asthma, bradycardia (hence not on beta blockers), recent hospitalization 9/19-9/22 for unstable angina at which time cath showed stable anatomy, now  presented to [facility] ED on 04/24/15 with worsening dyspnea, orthopnea, productive cough, pleuritic appearing chest pain but no fevers. Recently seen by PCP and started on azithromycin for suspected pneumonia & Lasix dose increased. Noncompliant with fluid and water restriction. Admitted for decompensated CHF & question of pneumonia. Per MD notes, on 11/11 patient began to c/o coughing while eating.    Assessment / Plan / Recommendation Clinical Impression  Bedside swallow evaluation complete. Patient presents with a suspected primary esophageal dysphagia. Oropharyngeal swallow appears intact without overt indication of aspiration. Patient however with c/o globus with pureed solids, delayed oral transit of soft solids due to reported anxiety over swallow, and regurgitation of soft solid bolus mixed with secretions post po intake, all suspicious for esophageal component. Per family, patient with h/o esophageal dilations in the past. Esophageal deficits do increase risk of aspiration. Provided patient with diet modifications and compensatory strategies and will f/u for need for further intervention. MD, may wish to consider GI consult to f/u on previous and possibly reoccurring deficits.    Aspiration Risk  Mild aspiration risk    Diet Recommendation   dysphagia 3, thin liquids Remain upright for 30 minutes after meals Alternate liquids and solids   Medication Administration: Crushed with puree    Other  Recommendations Recommended Consults: Consider GI evaluation Oral Care Recommendations: Oral care BID   Follow up Recommendations  None    Frequency and Duration min 1 x/week  1 week       Swallow Study   General HPI: [name] is an 81 y.o. female patient with history of chronic combined CHF (EF 45-50 percent & grade 3 diastolic dysfunction by [facility] 02/2015), CAD, DM 2, HTN, HLD, OSA on nightly CPAP, PAD status post left BKA, stage III CKD, COPD/? Asthma, bradycardia (hence not on beta  blockers), recent hospitalization 9/19-9/22 for unstable angina at which time cath showed stable anatomy, now presented to [facility] ED on 04/24/15 with worsening dyspnea, orthopnea, productive cough, pleuritic appearing chest pain but no fevers. Recently seen by PCP and started on azithromycin for suspected pneumonia & Lasix dose increased. Noncompliant with fluid and water restriction. Admitted for decompensated CHF & question of pneumonia. Per MD notes, on 11/11 patient began to c/o coughing while eating.  Type of Study: Bedside Swallow Evaluation Previous Swallow Assessment: none noted Diet Prior to this Study: NPO Temperature Spikes Noted: No Respiratory Status: Nasal cannula History of Recent Intubation: No Behavior/Cognition: Alert;Cooperative;Pleasant mood Oral Cavity Assessment: Within Functional Limits Oral Care Completed by SLP: No Oral Cavity - Dentition: Dentures, top Vision: Functional for self-feeding Self-Feeding Abilities: Able  to feed self Patient Positioning: Upright in bed Baseline Vocal Quality: Normal Volitional Cough: Strong Volitional Swallow: Able to elicit    Oral/Motor/Sensory Function Overall Oral Motor/Sensory Function: Within functional limits   Ice Chips Ice chips: Not tested   Thin Liquid Thin Liquid: Within functional limits Presentation: Cup;Self Fed;Straw    Nectar Thick Nectar Thick Liquid: Not tested   Honey Thick Honey Thick Liquid: Not tested   Puree Puree: Impaired Presentation: Spoon Other Comments: c/o globus, cleared with liquid wash   Solid Solid: Impaired Presentation: Self Fed Oral Phase Impairments: Impaired mastication Oral Phase Functional Implications: Prolonged oral transit (c/o anxiety regarding swallow) Other Comments: regurgitation of bolus post swallow      Dereonna Lensing MA, CCC-SLP [phone]  Raidon Swanner Meryl 04/28/2015,2:05 PM

## 2015-04-28 NOTE — Progress Notes (Signed)
Inpatient Diabetes Program Recommendations  AACE/ADA: New Consensus Statement on Inpatient Glycemic Control (2015)  Target Ranges:  Prepandial:   less than 140 mg/dL      Peak postprandial:   less than 180 mg/dL (1-2 hours)      Critically ill patients:  140 - 180 mg/dL   Review of Glycemic Control  Diabetes history: DM 2 Outpatient Diabetes medications: 70/30 14 units Daily Current orders for Inpatient glycemic control: 70/30 14 units Daily  Inpatient Diabetes Program Recommendations: Correction (SSI): Patients glucose increases into the 200s around meal times. Please consider adding Novolog Sensitive Correction TID.   Thanks,  Tama Headings RN, MSN, [facility] Inpatient Diabetes Coordinator Team Pager [phone] (8a-5p)

## 2015-04-29 LAB — BASIC METABOLIC PANEL
Anion gap: 15 (ref 5–15)
BUN: 71 mg/dL — ABNORMAL HIGH (ref 6–20)
CHLORIDE: 95 mmol/L — AB (ref 101–111)
CO2: 24 mmol/L (ref 22–32)
CREATININE: 4.35 mg/dL — AB (ref 0.44–1.00)
Calcium: 8.5 mg/dL — ABNORMAL LOW (ref 8.9–10.3)
GFR calc non Af Amer: 9 mL/min — ABNORMAL LOW (ref >60)
GFR, EST AFRICAN AMERICAN: 10 mL/min — AB (ref >60)
Glucose, Bld: 434 mg/dL — ABNORMAL HIGH (ref 65–99)
POTASSIUM: 5.2 mmol/L — AB (ref 3.5–5.1)
SODIUM: 134 mmol/L — AB (ref 135–145)

## 2015-04-29 LAB — CULTURE, BLOOD (ROUTINE X 2)
CULTURE: NO GROWTH
CULTURE: NO GROWTH

## 2015-04-29 LAB — GLUCOSE, CAPILLARY
GLUCOSE-CAPILLARY: 407 mg/dL — AB (ref 65–99)
GLUCOSE-CAPILLARY: 480 mg/dL — AB (ref 65–99)
Glucose-Capillary: 341 mg/dL — ABNORMAL HIGH (ref 65–99)
Glucose-Capillary: 355 mg/dL — ABNORMAL HIGH (ref 65–99)

## 2015-04-29 MED ORDER — INSULIN ASPART 100 UNIT/ML ~~LOC~~ SOLN: 0.0000 [IU] | Freq: Every day | SUBCUTANEOUS | Status: DC

## 2015-04-29 MED ORDER — INSULIN ASPART 100 UNIT/ML ~~LOC~~ SOLN: 0.0000 [IU] | Freq: Three times a day (TID) | SUBCUTANEOUS | Status: DC

## 2015-04-29 MED ORDER — INSULIN ASPART 100 UNIT/ML ~~LOC~~ SOLN
0.0000 [IU] | Freq: Three times a day (TID) | SUBCUTANEOUS | Status: DC
  Administered 2015-04-30: 8 [IU] via SUBCUTANEOUS
  Administered 2015-04-30: 5 [IU] via SUBCUTANEOUS
  Administered 2015-04-30: 11 [IU] via SUBCUTANEOUS
  Administered 2015-05-01 (×2): 15 [IU] via SUBCUTANEOUS
  Administered 2015-05-01 – 2015-05-02 (×2): 5 [IU] via SUBCUTANEOUS
  Administered 2015-05-02: 8 [IU] via SUBCUTANEOUS
  Administered 2015-05-02: 3 [IU] via SUBCUTANEOUS
  Administered 2015-05-03: 8 [IU] via SUBCUTANEOUS
  Administered 2015-05-03 (×2): 3 [IU] via SUBCUTANEOUS
  Administered 2015-05-05: 2 [IU] via SUBCUTANEOUS
  Administered 2015-05-06: 3 [IU] via SUBCUTANEOUS
  Administered 2015-05-06 – 2015-05-08 (×4): 5 [IU] via SUBCUTANEOUS
  Administered 2015-05-08 (×2): 3 [IU] via SUBCUTANEOUS
  Administered 2015-05-09: 5 [IU] via SUBCUTANEOUS
  Administered 2015-05-09: 3 [IU] via SUBCUTANEOUS
  Administered 2015-05-10: 5 [IU] via SUBCUTANEOUS
  Administered 2015-05-11: 8 [IU] via SUBCUTANEOUS
  Administered 2015-05-11: 3 [IU] via SUBCUTANEOUS
  Administered 2015-05-12: 5 [IU] via SUBCUTANEOUS
  Administered 2015-05-12: 3 [IU] via SUBCUTANEOUS
  Administered 2015-05-13: 5 [IU] via SUBCUTANEOUS
  Administered 2015-05-13: 3 [IU] via SUBCUTANEOUS
  Administered 2015-05-13: 5 [IU] via SUBCUTANEOUS
  Administered 2015-05-14: 2 [IU] via SUBCUTANEOUS
  Administered 2015-05-14: 5 [IU] via SUBCUTANEOUS
  Administered 2015-05-14: 15 [IU] via SUBCUTANEOUS
  Administered 2015-05-15: 5 [IU] via SUBCUTANEOUS
  Administered 2015-05-15: 2 [IU] via SUBCUTANEOUS

## 2015-04-29 MED ORDER — SODIUM CHLORIDE 0.9 % IV BOLUS (SEPSIS)
500.0000 mL | Freq: Once | INTRAVENOUS | Status: AC
  Administered 2015-04-29: 500 mL via INTRAVENOUS

## 2015-04-29 MED ORDER — METHYLPREDNISOLONE SODIUM SUCC 40 MG IJ SOLR
40.0000 mg | Freq: Two times a day (BID) | INTRAMUSCULAR | Status: DC
  Administered 2015-04-29 – 2015-05-02 (×7): 40 mg via INTRAVENOUS
  Filled 2015-04-29 (×7): qty 1

## 2015-04-29 MED ORDER — INSULIN DETEMIR 100 UNIT/ML ~~LOC~~ SOLN
10.0000 [IU] | Freq: Two times a day (BID) | SUBCUTANEOUS | Status: DC
  Administered 2015-04-29: 10 [IU] via SUBCUTANEOUS
  Filled 2015-04-29 (×2): qty 0.1

## 2015-04-29 MED ORDER — INSULIN ASPART PROT & ASPART (70-30 MIX) 100 UNIT/ML ~~LOC~~ SUSP
14.0000 [IU] | Freq: Every day | SUBCUTANEOUS | Status: DC
  Administered 2015-04-29: 14 [IU] via SUBCUTANEOUS
  Filled 2015-04-29: qty 10

## 2015-04-29 MED ORDER — INSULIN DETEMIR 100 UNIT/ML ~~LOC~~ SOLN
20.0000 [IU] | Freq: Two times a day (BID) | SUBCUTANEOUS | Status: DC
  Administered 2015-04-29 – 2015-05-04 (×9): 20 [IU] via SUBCUTANEOUS
  Filled 2015-04-29 (×11): qty 0.2

## 2015-04-29 MED ORDER — INSULIN ASPART 100 UNIT/ML ~~LOC~~ SOLN
0.0000 [IU] | Freq: Every day | SUBCUTANEOUS | Status: DC
  Administered 2015-04-29: 4 [IU] via SUBCUTANEOUS
  Administered 2015-04-30 – 2015-05-06 (×3): 2 [IU] via SUBCUTANEOUS
  Administered 2015-05-07: 3 [IU] via SUBCUTANEOUS

## 2015-04-29 MED ORDER — INSULIN DETEMIR 100 UNIT/ML ~~LOC~~ SOLN
5.0000 [IU] | Freq: Every day | SUBCUTANEOUS | Status: DC
  Filled 2015-04-29: qty 0.05

## 2015-04-29 MED ORDER — INSULIN ASPART 100 UNIT/ML ~~LOC~~ SOLN
3.0000 [IU] | Freq: Three times a day (TID) | SUBCUTANEOUS | Status: DC
  Administered 2015-04-29: 3 [IU] via SUBCUTANEOUS

## 2015-04-29 MED ORDER — INSULIN ASPART 100 UNIT/ML ~~LOC~~ SOLN
25.0000 [IU] | Freq: Once | SUBCUTANEOUS | Status: AC
  Administered 2015-04-29: 25 [IU] via SUBCUTANEOUS

## 2015-04-29 MED ORDER — INSULIN ASPART 100 UNIT/ML ~~LOC~~ SOLN
0.0000 [IU] | Freq: Three times a day (TID) | SUBCUTANEOUS | Status: DC
  Administered 2015-04-29: 9 [IU] via SUBCUTANEOUS

## 2015-04-29 MED ORDER — INSULIN ASPART 100 UNIT/ML ~~LOC~~ SOLN
3.0000 [IU] | Freq: Three times a day (TID) | SUBCUTANEOUS | Status: DC
  Administered 2015-04-30 – 2015-05-03 (×9): 3 [IU] via SUBCUTANEOUS

## 2015-04-29 MED ORDER — IPRATROPIUM-ALBUTEROL 0.5-2.5 (3) MG/3ML IN SOLN
3.0000 mL | Freq: Three times a day (TID) | RESPIRATORY_TRACT | Status: DC
  Administered 2015-04-30 – 2015-05-05 (×14): 3 mL via RESPIRATORY_TRACT
  Filled 2015-04-29 (×16): qty 3

## 2015-04-29 NOTE — Progress Notes (Signed)
Pt running [place] at this point, usually at 41's and once 2. MD aware, pt is asymptomatic, will continue to monitor the patient.

## 2015-04-29 NOTE — Progress Notes (Signed)
[facility] PROGRESS NOTE    Progress Note   [name] [id number] DOB: 1929/10/13 DOA: 04/24/2015 PCP: Renato Shin, MD   Brief Narrative:   [name] is an 81 y.o. female patient with history of chronic combined CHF (EF 45-50 percent & grade 3 diastolic dysfunction by [facility] 02/2015), CAD, DM 2, HTN, HLD, OSA on nightly CPAP, PAD status post left BKA, stage III CKD, COPD/? Asthma, bradycardia (hence not on beta blockers), recent hospitalization 9/19-9/22 for unstable angina at which time cath showed stable anatomy, now presented to [facility] ED on 04/24/15 with worsening dyspnea, orthopnea, productive cough, pleuritic appearing chest pain but no fevers. Recently seen by PCP and started on azithromycin for suspected pneumonia & Lasix dose increased. Noncompliant with fluid and water restriction. Admitted for decompensated CHF &? Pneumonia.  Assessment/Plan:   Acute respiratory failure with hypoxia (HCC) due to   Acute on chronic diastolic heart failure [facility]): He showed an ejection fraction of 45% with grade 3 diastolic heart failure, patient has been noncompliant with fluid and salt restriction. Cardiology was consulted. Continue bisoprolol, hydralazine and Imdur. Diuresis was stopped as her creatinine started to rise. She was giving her normal saline bolus. Basic metabolic panel is pending.  Possible healthcare associated pneumonia: On 05/05/15 she started wheezing and complaining of cough with eating. Swallowing evaluation done and recommended a dysphagia 3 diet Cont IV Solu-Medrol and empiric antibiotic vancomycin and Fortaz. DC Levaquin  Diabetes type 2 with renal complications: Start long-acting insulin plus sliding scale blood glucoses running high.  AKI on Chronic kidney disease stage III: Also be new baseline 1.7-1.8. Basic metabolic panel is pending.  Elevated troponins in the setting of Nonobstructive CAD: Likely due to demand ischemia without chest  pain, we'll continue statin aspirin Plavix and Ranexa hydralazine and Imdur.  Hyperlipidemia: Continue statin therapy.  Obstructive sleep apnea: Continues C-pap at night  History of gout: Continue Allopurinol     DVT Prophylaxis - Lovenox ordered.  Family Communication: husband Disposition Plan: Home in am Code Status:     Code Status Orders        Start     Ordered   04/24/15 2353  Full code   Continuous     04/24/15 2354        IV Access:    Peripheral IV   Procedures and diagnostic studies:   Dg Chest Port 1 View  2015-05-05  CLINICAL DATA:  Cough, wheezing for 2 weeks. EXAM: PORTABLE CHEST 1 VIEW COMPARISON:  04/26/2015 FINDINGS: Cardiomegaly. Decreasing lung volumes with increasing perihilar and lower lobe airspace opacities concerning for CHF. No effusions. No acute bony abnormality. IMPRESSION: Cardiomegaly with increasing bilateral airspace disease, likely edema/ CHF. Low lung volumes. Electronically Signed   By: Rolm Baptise M.D.   On: 05-05-15 11:11     Medical Consultants:    None.  Anti-Infectives:   Anti-infectives    Start     Dose/Rate Route Frequency Ordered Stop   04/30/15 1200  vancomycin (VANCOCIN) IVPB 1000 mg/200 mL premix     1,000 mg 200 mL/hr over 60 Minutes Intravenous Every 48 hours 05/05/2015 1254     04/30/15 1100  levofloxacin (LEVAQUIN) IVPB 500 mg     500 mg 100 mL/hr over 60 Minutes Intravenous Every 48 hours May 05, 2015 1052     May 05, 2015 1100  levofloxacin (LEVAQUIN) IVPB 750 mg     750 mg 100 mL/hr over 90 Minutes Intravenous  Once 05-May-2015 1034 05-May-2015 1830   05-05-2015  1100  cefTAZidime (FORTAZ) 2 g in dextrose 5 % 50 mL IVPB     2 g 100 mL/hr over 30 Minutes Intravenous Every 24 hours 04/28/15 1037     04/28/15 1100  vancomycin (VANCOCIN) IVPB 1000 mg/200 mL premix     1,000 mg 200 mL/hr over 60 Minutes Intravenous  Once 04/28/15 1048 04/28/15 1408   04/25/15 2100  vancomycin (VANCOCIN) IVPB 1000 mg/200 mL premix   Status:  Discontinued     1,000 mg 200 mL/hr over 60 Minutes Intravenous Every 24 hours 04/24/15 2032 04/26/15 1302   04/25/15 2030  cefTAZidime (FORTAZ) 2 g in dextrose 5 % 50 mL IVPB  Status:  Discontinued     2 g 100 mL/hr over 30 Minutes Intravenous Every 24 hours 04/24/15 2032 04/26/15 1302   04/25/15 0000  cefTAZidime (FORTAZ) 2 g in dextrose 5 % 50 mL IVPB  Status:  Discontinued     2 g 100 mL/hr over 30 Minutes Intravenous 3 times per day 04/24/15 2354 04/25/15 0018   04/24/15 2030  cefTAZidime (FORTAZ) 2 g in dextrose 5 % 50 mL IVPB  Status:  Discontinued     2 g 100 mL/hr over 30 Minutes Intravenous  Once 04/24/15 2025 04/24/15 2032   04/24/15 2030  vancomycin (VANCOCIN) IVPB 1000 mg/200 mL premix  Status:  Discontinued     1,000 mg 200 mL/hr over 60 Minutes Intravenous  Once 04/24/15 2025 04/24/15 2032      Subjective:    KRISALYNN [name] she relates her breathing is better today.  Objective:    Filed Vitals:   04/29/15 0220 04/29/15 0520 04/29/15 0800 04/29/15 0944  BP:  136/54 130/60   Pulse:  65 66   Temp:  97.3 F (36.3 C)    TempSrc:  Oral    Resp:  18    Height:      Weight:  88.742 kg (195 lb 10.2 oz)    SpO2: 98% 99% 96% 96%    Intake/Output Summary (Last 24 hours) at 04/29/15 0949 Last data filed at 04/29/15 0800  Gross per 24 hour  Intake    440 ml  Output    150 ml  Net    290 ml   Filed Weights   04/27/15 0408 04/28/15 0602 04/29/15 0520  Weight: 87.2 kg (192 lb 3.9 oz) 87.9 kg (193 lb 12.6 oz) 88.742 kg (195 lb 10.2 oz)    Exam: Gen:  NAD Cardiovascular:  RRR, No M/R/G Chest and lungs:   Good air movement with,crackles on the right. Abdomen:  Abdomen soft, NT/ND, + BS Extremities:  No lower extremity edema, Left BKA.   Data Reviewed:    Labs: Basic Metabolic Panel:  Recent Labs Lab 04/24/15 1738 04/25/15 0051 04/25/15 0708 04/26/15 0315 04/27/15 0357 04/28/15 0620  NA 141  --  142 141 139 137  K 4.4  --  3.5 3.6 4.9  4.2  CL 104  --  101 101 100* 97*  CO2 26  --  28 30 27 28   GLUCOSE 87  --  164* 194* 155* 206*  BUN 24*  --  21* 21* 29* 45*  CREATININE 2.05* 2.07* 1.94* 1.88* 2.44* 3.10*  CALCIUM 8.8*  --  8.7* 8.7* 8.5* 8.6*  MG  --   --  1.9  --   --   --    GFR Estimated Creatinine Clearance: 15.4 mL/min (by C-G formula based on Cr of 3.1). Liver Function Tests:  Recent  Labs Lab 04/24/15 1738  AST 45*  ALT 32  ALKPHOS 111  BILITOT 1.0  PROT 6.3*  ALBUMIN 3.3*   No results for input(s): LIPASE, AMYLASE in the last 168 hours. No results for input(s): AMMONIA in the last 168 hours. Coagulation profile No results for input(s): INR, PROTIME in the last 168 hours.  CBC:  Recent Labs Lab 04/24/15 1738 04/25/15 0051 04/25/15 0708 04/28/15 0620  WBC 6.8 6.6 7.7 10.5  NEUTROABS 5.0  --   --   --   HGB 10.5* 10.6* 10.8* 9.8*  HCT 29.2* 30.6* 33.7* 27.8*  MCV 92.1 88.7 86.2 89.4  PLT 365 308 PLATELET CLUMPS NOTED ON SMEAR, COUNT APPEARS ADEQUATE 333   Cardiac Enzymes:  Recent Labs Lab 04/24/15 1738 04/25/15 0051 04/25/15 0708 04/25/15 1203  TROPONINI 0.17* 0.16* 0.16* 0.15*   BNP (last 3 results)  Recent Labs  06/08/14 1617  PROBNP 1249.0*   CBG:  Recent Labs Lab 04/28/15 0614 04/28/15 1127 04/28/15 1651 04/28/15 2148 04/29/15 0656  GLUCAP 182* 187* 278* 343* 341*   D-Dimer: No results for input(s): DDIMER in the last 72 hours. Hgb A1c: No results for input(s): HGBA1C in the last 72 hours. Lipid Profile: No results for input(s): CHOL, HDL, LDLCALC, TRIG, CHOLHDL, LDLDIRECT in the last 72 hours. Thyroid function studies: No results for input(s): TSH, T4TOTAL, T3FREE, THYROIDAB in the last 72 hours.  Invalid input(s): FREET3 Anemia work up: No results for input(s): VITAMINB12, FOLATE, FERRITIN, TIBC, IRON, RETICCTPCT in the last 72 hours. Sepsis Labs:  Recent Labs Lab 04/24/15 1738 04/24/15 1758 04/24/15 2059 04/25/15 0051 04/25/15 0708  04/28/15 0620  PROCALCITON  --   --  <0.10  --   --   --   WBC 6.8  --   --  6.6 7.7 10.5  LATICACIDVEN  --  1.14  --   --   --   --    Microbiology Recent Results (from the past 240 hour(s))  Blood culture (routine x 2)     Status: None (Preliminary result)   Collection Time: 04/24/15  8:54 PM  Result Value Ref Range Status   Specimen Description BLOOD LEFT ANTECUBITAL  Final   Special Requests BOTTLES DRAWN AEROBIC AND ANAEROBIC 5CC   Final   Culture NO GROWTH 4 DAYS  Final   Report Status PENDING  Incomplete  Blood culture (routine x 2)     Status: None (Preliminary result)   Collection Time: 04/24/15  8:59 PM  Result Value Ref Range Status   Specimen Description BLOOD RIGHT ANTECUBITAL  Final   Special Requests BOTTLES DRAWN AEROBIC AND ANAEROBIC 5CC   Final   Culture NO GROWTH 4 DAYS  Final   Report Status PENDING  Incomplete  MRSA PCR Screening     Status: None   Collection Time: 04/24/15 10:51 PM  Result Value Ref Range Status   MRSA by PCR NEGATIVE NEGATIVE Final    Comment:        The GeneXpert MRSA Assay (FDA approved for NASAL specimens only), is one component of a comprehensive MRSA colonization surveillance program. It is not intended to diagnose MRSA infection nor to guide or monitor treatment for MRSA infections.      Medications:   . allopurinol  100 mg Oral Daily  . amLODipine  10 mg Oral Daily  . aspirin EC  81 mg Oral Daily  . atorvastatin  40 mg Oral q1800  . bisoprolol  2.5 mg Oral Daily  .  calcitRIOL  0.25 mcg Oral Daily  . cefTAZidime (FORTAZ)  IV  2 g Intravenous Q24H  . clopidogrel  75 mg Oral Daily  . donepezil  5 mg Oral QHS  . enoxaparin (LOVENOX) injection  30 mg Subcutaneous QHS  . ezetimibe  10 mg Oral q morning - 10a  . feeding supplement (GLUCERNA SHAKE)  237 mL Oral q morning - 10a  . gabapentin  300 mg Oral QHS  . hydrALAZINE  50 mg Oral TID  . insulin aspart protamine- aspart  14 Units Subcutaneous Q breakfast  .  ipratropium-albuterol  3 mL Nebulization Q6H  . isosorbide mononitrate  60 mg Oral Daily  . latanoprost  1 drop Both Eyes QHS  . [START ON 04/30/2015] levofloxacin (LEVAQUIN) IV  500 mg Intravenous Q48H  . methylPREDNISolone (SOLU-MEDROL) injection  80 mg Intravenous Q12H  . mometasone-formoterol  2 puff Inhalation BID  . pantoprazole  40 mg Oral BID  . ranolazine  500 mg Oral BID  . sertraline  100 mg Oral Daily  . sodium chloride  3 mL Intravenous Q12H  . [START ON 04/30/2015] vancomycin  1,000 mg Intravenous Q48H   Continuous Infusions:   Time spent: 25 min   LOS: 5 days   Charlynne Cousins  [facility] Pager [phone]  *Please refer to [email or web address], password [email or web address] to get updated schedule on who will round on this patient, as hospitalists switch teams weekly. If 7PM-7AM, please contact night-coverage at [email or web address], password [email or web address] for any overnight needs.  04/29/2015, 9:49 AM

## 2015-04-29 NOTE — Progress Notes (Signed)
Subjective: More short of breath today. Feels "shaky". Not on oxygen when I entered the room. Recorded 2L positive - not sure if output is accurately measured. Labs drawn just 30 minutes ago. Weight has gone up 2 lbs to 195 lb.  Objective: Vital signs in last 24 hours: Temp:  [97.3 F (36.3 C)-98.4 F (36.9 C)] 97.3 F (36.3 C) (11/12 0520) Pulse Rate:  [43-91] 66 (11/12 0800) Resp:  [16-18] 18 (11/12 0520) BP: (127-136)/(54-60) 130/60 mmHg (11/12 0800) SpO2:  [95 %-99 %] 96 % (11/12 0944) Weight:  [195 lb 10.2 oz (88.742 kg)] 195 lb 10.2 oz (88.742 kg) (11/12 0520) Last BM Date: 04/25/15  Intake/Output from previous day: 11/11 0701 - 11/12 0700 In: 360 [P.O.:360] Out: 200 [Urine:200] Intake/Output this shift: Total I/O In: 200 [P.O.:200] Out: 100 [Urine:100]  Medications Scheduled Meds: . allopurinol  100 mg Oral Daily  . amLODipine  10 mg Oral Daily  . aspirin EC  81 mg Oral Daily  . atorvastatin  40 mg Oral q1800  . bisoprolol  2.5 mg Oral Daily  . calcitRIOL  0.25 mcg Oral Daily  . cefTAZidime (FORTAZ)  IV  2 g Intravenous Q24H  . clopidogrel  75 mg Oral Daily  . donepezil  5 mg Oral QHS  . enoxaparin (LOVENOX) injection  30 mg Subcutaneous QHS  . ezetimibe  10 mg Oral q morning - 10a  . feeding supplement (GLUCERNA SHAKE)  237 mL Oral q morning - 10a  . gabapentin  300 mg Oral QHS  . hydrALAZINE  50 mg Oral TID  . insulin aspart  0-5 Units Subcutaneous QHS  . insulin aspart  0-9 Units Subcutaneous TID WC  . insulin aspart  3 Units Subcutaneous TID WC  . insulin detemir  10 Units Subcutaneous BID  . ipratropium-albuterol  3 mL Nebulization Q6H  . isosorbide mononitrate  60 mg Oral Daily  . latanoprost  1 drop Both Eyes QHS  . methylPREDNISolone (SOLU-MEDROL) injection  80 mg Intravenous Q12H  . mometasone-formoterol  2 puff Inhalation BID  . pantoprazole  40 mg Oral BID  . ranolazine  500 mg Oral BID  . sertraline  100 mg Oral Daily  . sodium chloride  3  mL Intravenous Q12H  . [START ON 04/30/2015] vancomycin  1,000 mg Intravenous Q48H   Continuous Infusions:  PRN Meds:.acetaminophen **OR** acetaminophen, albuterol, guaiFENesin, HYDROcodone-acetaminophen, menthol-cetylpyridinium, nitroGLYCERIN, ondansetron **OR** ondansetron (ZOFRAN) IV  PE: General appearance: alert, cooperative and no distress Lungs: R>L wheezing, +rhonchi Heart: regular rate and rhythm, S1, S2 normal, no murmur, click, rub or gallop Abdomen: +BS, nontender Extremities: Trace right LEE Pulses: 2+ and symmetric Skin: WArm and dry Neurologic: Grossly normal  Lab Results:   Recent Labs  04/28/15 0620  WBC 10.5  HGB 9.8*  HCT 27.8*  PLT 333   BMET  Recent Labs  04/27/15 0357 04/28/15 0620  NA 139 137  K 4.9 4.2  CL 100* 97*  CO2 27 28  GLUCOSE 155* 206*  BUN 29* 45*  CREATININE 2.44* 3.10*  CALCIUM 8.5* 8.6*     Assessment/Plan  Principal Problem:   Acute respiratory failure with hypoxia (HCC) Active Problems:   DM (diabetes mellitus), type 2, uncontrolled, with renal complications (HCC)   Hyperlipidemia   Essential hypertension   Coronary artery disease due to lipid rich plaque; 70% ostial-proximal AV Groove Cx - not good PCI target   Acute on chronic diastolic heart failure (HCC)   HCAP (healthcare-associated pneumonia)  NSVT (nonsustained ventricular tachycardia) (HCC)   Acute on chronic congestive heart failure ([place])   Awaiting labs today. If creatinine has improved, consider restarting maintenance diuretics as there is more dyspnea and weight is now climbing. We may have to accept a higher creatinine at baseline for her to be adequately diuresed. She is complaining of shakiness - not sure what this is related to, will review CBG when labs result. HR remains persistently low in the 40's - would discontinue b-blocker. Fortunately, there has been no further NSVT.  Christina Casino, MD, [facility] Attending Cardiologist [facility]    LOS:  5 days   [name]  04/29/2015 12:12 PM

## 2015-04-29 NOTE — Progress Notes (Signed)
Pt's blood sugar 480, MD aware, pt is asymptomatic, Insulin administered as ordered, will continue to monitor the patient.

## 2015-04-30 DIAGNOSIS — E875 Hyperkalemia: Secondary | ICD-10-CM

## 2015-04-30 DIAGNOSIS — I5023 Acute on chronic systolic (congestive) heart failure: Secondary | ICD-10-CM

## 2015-04-30 DIAGNOSIS — R001 Bradycardia, unspecified: Secondary | ICD-10-CM

## 2015-04-30 LAB — BASIC METABOLIC PANEL
Anion gap: 17 — ABNORMAL HIGH (ref 5–15)
BUN: 87 mg/dL — AB (ref 6–20)
CO2: 21 mmol/L — ABNORMAL LOW (ref 22–32)
CREATININE: 5.37 mg/dL — AB (ref 0.44–1.00)
Calcium: 8.7 mg/dL — ABNORMAL LOW (ref 8.9–10.3)
Chloride: 94 mmol/L — ABNORMAL LOW (ref 101–111)
GFR calc Af Amer: 8 mL/min — ABNORMAL LOW (ref >60)
GFR, EST NON AFRICAN AMERICAN: 7 mL/min — AB (ref >60)
GLUCOSE: 324 mg/dL — AB (ref 65–99)
POTASSIUM: 5.9 mmol/L — AB (ref 3.5–5.1)
SODIUM: 132 mmol/L — AB (ref 135–145)

## 2015-04-30 LAB — URINALYSIS, ROUTINE W REFLEX MICROSCOPIC
Glucose, UA: 100 mg/dL — AB
Ketones, ur: 15 mg/dL — AB
Nitrite: NEGATIVE
PH: 5 (ref 5.0–8.0)
Protein, ur: 100 mg/dL — AB
SPECIFIC GRAVITY, URINE: 1.025 (ref 1.005–1.030)
UROBILINOGEN UA: 1 mg/dL (ref 0.0–1.0)

## 2015-04-30 LAB — GLUCOSE, CAPILLARY
GLUCOSE-CAPILLARY: 275 mg/dL — AB (ref 65–99)
GLUCOSE-CAPILLARY: 244 mg/dL — AB (ref 65–99)
GLUCOSE-CAPILLARY: 235 mg/dL — AB (ref 65–99)
Glucose-Capillary: 320 mg/dL — ABNORMAL HIGH (ref 65–99)

## 2015-04-30 LAB — URINE MICROSCOPIC-ADD ON

## 2015-04-30 LAB — CREATININE, URINE, RANDOM: Creatinine, Urine: 114.42 mg/dL

## 2015-04-30 LAB — VANCOMYCIN, RANDOM: Vancomycin Rm: 15 ug/mL

## 2015-04-30 LAB — SODIUM, URINE, RANDOM: SODIUM UR: 22 mmol/L

## 2015-04-30 MED ORDER — INSULIN ASPART 100 UNIT/ML ~~LOC~~ SOLN: 10.0000 [IU] | Freq: Once | SUBCUTANEOUS | Status: DC

## 2015-04-30 MED ORDER — LEVOFLOXACIN IN D5W 750 MG/150ML IV SOLN
750.0000 mg | Freq: Once | INTRAVENOUS | Status: AC
  Administered 2015-04-30: 750 mg via INTRAVENOUS
  Filled 2015-04-30: qty 150

## 2015-04-30 MED ORDER — LEVOFLOXACIN IN D5W 500 MG/100ML IV SOLN
500.0000 mg | INTRAVENOUS | Status: DC
  Administered 2015-05-02 – 2015-05-04 (×2): 500 mg via INTRAVENOUS
  Filled 2015-04-30 (×3): qty 100

## 2015-04-30 MED ORDER — FUROSEMIDE 10 MG/ML IJ SOLN
40.0000 mg | Freq: Two times a day (BID) | INTRAMUSCULAR | Status: DC
  Administered 2015-04-30 – 2015-05-03 (×7): 40 mg via INTRAVENOUS
  Filled 2015-04-30 (×7): qty 4

## 2015-04-30 MED ORDER — SODIUM CHLORIDE 0.9 % IV SOLN
1.0000 g | Freq: Once | INTRAVENOUS | Status: AC
  Administered 2015-04-30: 1 g via INTRAVENOUS
  Filled 2015-04-30: qty 10

## 2015-04-30 MED ORDER — DEXTROSE 50 % IV SOLN
INTRAVENOUS | Status: AC
  Filled 2015-04-30: qty 50

## 2015-04-30 MED ORDER — SODIUM POLYSTYRENE SULFONATE 15 GM/60ML PO SUSP
30.0000 g | Freq: Once | ORAL | Status: AC
  Administered 2015-04-30: 30 g via ORAL
  Filled 2015-04-30: qty 120

## 2015-04-30 MED ORDER — DEXTROSE 50 % IV SOLN: 1.0000 | Freq: Once | INTRAVENOUS | Status: DC

## 2015-04-30 NOTE — Progress Notes (Signed)
SUBJECTIVE:  complians of SOB.  OBJECTIVE:   Vitals:   Filed Vitals:   04/29/15 2052 04/29/15 2100 04/29/15 2338 04/30/15 0537  BP:  133/58  107/68  Pulse:  47 53 63  Temp:  97.9 F (36.6 C)  98.3 F (36.8 C)  TempSrc:  Oral  Oral  Resp:  20 18 18   Height:      Weight:    195 lb 5.6 oz (88.61 kg)  SpO2: 98% 100% 99% 94%   I&O's:   Intake/Output Summary (Last 24 hours) at 04/30/15 0744 Last data filed at 04/30/15 0615  Gross per 24 hour  Intake   1030 ml  Output    225 ml  Net    805 ml   TELEMETRY: Reviewed telemetry pt in sinus bradycardia with PAC's.:     PHYSICAL EXAM General: Well developed, well nourished, in no acute distress Head: Eyes PERRLA, No xanthomas.   Normal cephalic and atramatic  Lungs:   Crackles at bases Heart:   HRRR S1 S2 Pulses are 2+ & equal. Abdomen: Bowel sounds are positive, abdomen soft and non-tender without masses  Extremities:   1+ edema of RLE Neuro: Alert and oriented X 3. Psych:  Good affect, responds appropriately   LABS: Basic Metabolic Panel:  Recent Labs  04/29/15 1102 04/30/15 0233  NA 134* 132*  K 5.2* 5.9*  CL 95* 94*  CO2 24 21*  GLUCOSE 434* 324*  BUN 71* 87*  CREATININE 4.35* 5.37*  CALCIUM 8.5* 8.7*   Liver Function Tests: No results for input(s): AST, ALT, ALKPHOS, BILITOT, PROT, ALBUMIN in the last 72 hours. No results for input(s): LIPASE, AMYLASE in the last 72 hours. CBC:  Recent Labs  04/28/15 0620  WBC 10.5  HGB 9.8*  HCT 27.8*  MCV 89.4  PLT 333   Cardiac Enzymes: No results for input(s): CKTOTAL, CKMB, CKMBINDEX, TROPONINI in the last 72 hours. BNP: Invalid input(s): POCBNP D-Dimer: No results for input(s): DDIMER in the last 72 hours. Hemoglobin A1C: No results for input(s): HGBA1C in the last 72 hours. Fasting Lipid Panel: No results for input(s): CHOL, HDL, LDLCALC, TRIG, CHOLHDL, LDLDIRECT in the last 72 hours. Thyroid Function Tests: No results for input(s): TSH, T4TOTAL,  T3FREE, THYROIDAB in the last 72 hours.  Invalid input(s): FREET3 Anemia Panel: No results for input(s): VITAMINB12, FOLATE, FERRITIN, TIBC, IRON, RETICCTPCT in the last 72 hours. Coag Panel:   Lab Results  Component Value Date   INR 1.23 03/07/2015   INR 1.18 06/18/2014   INR 1.19 06/17/2014    RADIOLOGY: Dg Chest 2 View  04/26/2015  CLINICAL DATA:  Shortness of breath. EXAM: CHEST  2 VIEW COMPARISON:  04/24/2015. FINDINGS: Cardiomegaly with pulmonary vascular prominence and mild interstitial prominence, improved from prior exam. Findings consistent with improving congestive heart failure. Cardiomegaly remains severe. Underlying process such as pericardial effusion, cardiomyopathy, or cardiac valve disease cannot be excluded . No pleural effusion or pneumothorax . IMPRESSION: 1. Improving congestive heart failure. 2. Persistent severe cardiomegaly. Underlying process such as pericardial fusion, cardiomyopathy, cardiac valve disease cannot be excluded . Electronically Signed   By: Marcello Moores  Register   On: 04/26/2015 07:47   Dg Chest 2 View  04/24/2015  CLINICAL DATA:  Five day history of upper urinary tract infection. No improvement after antibiotics. Productive cough. EXAM: CHEST  2 VIEW COMPARISON:  04/19/2015 FINDINGS: The heart is enlarged but appears relatively stable. There is tortuosity and calcification of the thoracic aorta. Diffuse bilateral airspace  process could reflect pulmonary edema or bilateral infiltrates. No definite pleural effusions. The bony thorax is intact. IMPRESSION: Cardiac enlargement. Diffuse airspace process could reflect pulmonary edema or bilateral infiltrates. No definite pleural effusions. Electronically Signed   By: Marijo Sanes M.D.   On: 04/24/2015 17:16   Dg Chest 2 View  04/19/2015  CLINICAL DATA:  Cough for 1 week.  Wheezing for 2 days.  Congestion. EXAM: CHEST  2 VIEW COMPARISON:  03/06/2015 FINDINGS: There is cardiomegaly with vascular congestion and mild  perihilar opacities concerning for early edema. No effusions. No acute bony abnormality. IMPRESSION: Findings concerning for mild CHF. Electronically Signed   By: Rolm Baptise M.D.   On: 04/19/2015 12:42   Dg Chest Port 1 View  04/28/2015  CLINICAL DATA:  Cough, wheezing for 2 weeks. EXAM: PORTABLE CHEST 1 VIEW COMPARISON:  04/26/2015 FINDINGS: Cardiomegaly. Decreasing lung volumes with increasing perihilar and lower lobe airspace opacities concerning for CHF. No effusions. No acute bony abnormality. IMPRESSION: Cardiomegaly with increasing bilateral airspace disease, likely edema/ CHF. Low lung volumes. Electronically Signed   By: Rolm Baptise M.D.   On: 04/28/2015 11:11    Assessment/Plan  Principal Problem:  Acute respiratory failure with hypoxia (HCC) Active Problems:  DM (diabetes mellitus), type 2, uncontrolled, with renal complications (HCC)  Hyperlipidemia  Essential hypertension  Coronary artery disease due to lipid rich plaque; 70% ostial-proximal AV Groove Cx - not good PCI target  Acute on chronic diastolic heart failure (HCC)  HCAP (healthcare-associated pneumonia)  NSVT (nonsustained ventricular tachycardia) (HCC)  Acute on chronic congestive heart failure (HCC)   Creatinine continues to increase.  Continue to hold diuretics.  She is also hyperkalemic this am.  Received Kayexalate and calcium gluconate this am.  She still appears somewhat volume overloaded with LE edema and SOB.  Would recommend renal consult.   HR improved some after discontinuing  b-blocker. Now in the 50's with PAC's at times.  Overnight she had intermittent junctional rhythm.  Fortunately, there has been no further NSVT.   Christina Margarita, MD  04/30/2015  7:44 AM

## 2015-04-30 NOTE — Consult Note (Signed)
[facility] Renal Consultation Note  Requesting MD: Feliz-Ortiz Indication for Consultation: A on CRF  HPI:  [name] is a 81 y.o. female with past medical history significant for diabetes mellitus, hypertension, hyperlipidemia, obesity, obstructive sleep apnea with C PAP, COPD, coronary artery disease with also a mild cardiomyopathy EF in the 33s, PAD status post BKA as well as CKD appearing to have a creatinine of between 1.6 and 2.0 and is followed by Dr. Lorrene Reid at [facility]. She's noted to have a hospitalization in September for unstable angina and underwent cardiac catheterization. Looks like a discharge creatinine on September 29 was 1.99. She then presented on 11/ 7 with a major complaint being shortness of breath and a productive cough. Patient was admitted and started on antibiotics for pneumonia as well as lasix. Her initial creatinine was noted to be 2.05, at that improved to 1.88 over the next several days and that was on November 9. After that, patient's creatinine has risen steadily daily up to level of 5.37 today. Her weights do not indicate a significant diuresis and urine output was not well recorded- she has a foley now. There are no extremely low blood pressures according to the record there are some in the 110's- she has been bradycardic.  Her blood pressure medications have been decreased or stopped.   Urine output is poor.  She had been on been on  Vancomycin but  level was only 15. Urinalysis obtained on 11/ 9 was really unremarkable  CREATININE, SER  Date/Time Value Ref Range Status  04/30/2015 02:33 AM 5.37* 0.44 - 1.00 mg/dL Final  04/29/2015 11:02 AM 4.35* 0.44 - 1.00 mg/dL Final  04/28/2015 06:20 AM 3.10* 0.44 - 1.00 mg/dL Final  04/27/2015 03:57 AM 2.44* 0.44 - 1.00 mg/dL Final  04/26/2015 03:15 AM 1.88* 0.44 - 1.00 mg/dL Final  04/25/2015 07:08 AM 1.94* 0.44 - 1.00 mg/dL Final  04/25/2015 12:51 AM 2.07* 0.44 - 1.00 mg/dL Final   04/24/2015 05:38 PM 2.05* 0.44 - 1.00 mg/dL Final  03/16/2015 11:42 AM 1.99* 0.40 - 1.20 mg/dL Final  03/09/2015 04:11 AM 1.94* 0.44 - 1.00 mg/dL Final  03/08/2015 03:22 AM 1.83* 0.44 - 1.00 mg/dL Final  03/07/2015 04:17 AM 1.64* 0.44 - 1.00 mg/dL Final  03/07/2015 12:34 AM 1.75* 0.44 - 1.00 mg/dL Final  03/06/2015 12:02 PM 1.57* 0.44 - 1.00 mg/dL Final  02/17/2015 04:56 PM 1.57* 0.40 - 1.20 mg/dL Final  10/25/2014 11:14 AM 1.54* 0.40 - 1.20 mg/dL Final  07/27/2014 03:49 PM 1.52* 0.50 - 1.10 mg/dL Final  07/01/2014 02:54 PM 1.54* 0.40 - 1.20 mg/dL Final  06/27/2014 03:30 AM 1.90* 0.50 - 1.10 mg/dL Final  06/26/2014 03:27 AM 2.11* 0.50 - 1.10 mg/dL Final  06/25/2014 05:35 AM 2.01* 0.50 - 1.10 mg/dL Final  06/24/2014 04:17 AM 2.13* 0.50 - 1.10 mg/dL Final  06/23/2014 02:44 AM 1.89* 0.50 - 1.10 mg/dL Final  06/22/2014 02:20 PM 1.93* 0.50 - 1.10 mg/dL Final  06/22/2014 05:55 AM 1.73* 0.50 - 1.10 mg/dL Final  06/22/2014 04:09 AM 1.77* 0.50 - 1.10 mg/dL Final  06/21/2014 02:45 AM 1.69* 0.50 - 1.10 mg/dL Final  06/20/2014 03:25 AM 2.07* 0.50 - 1.10 mg/dL Final  06/19/2014 08:45 AM 2.58* 0.50 - 1.10 mg/dL Final  06/18/2014 06:25 AM 2.10* 0.50 - 1.10 mg/dL Final  06/17/2014 07:46 PM 1.39* 0.50 - 1.10 mg/dL Final  06/17/2014 01:29 PM 1.33* 0.50 - 1.10 mg/dL Final  06/08/2014 04:17 PM 1.6* 0.4 - 1.2 mg/dL Final  04/13/2014 11:29 AM 2.4* 0.4 - 1.2 mg/dL Final  04/08/2014 10:53 AM 2.4* 0.4 - 1.2 mg/dL Final  02/17/2014 12:02 PM 1.4* 0.4 - 1.2 mg/dL Final  12/30/2013 11:59 AM 1.4* 0.4 - 1.2 mg/dL Final  12/16/2013 09:26 AM 1.5* 0.4 - 1.2 mg/dL Final  12/08/2013 04:59 AM 1.94* 0.50 - 1.10 mg/dL Final  12/05/2013 06:53 PM 1.50* 0.50 - 1.10 mg/dL Final  11/15/2013 12:05 PM 1.7* 0.4 - 1.2 mg/dL Final  08/05/2013 09:36 AM 1.4* 0.4 - 1.2 mg/dL Final  07/12/2013 04:39 PM 1.5* 0.4 - 1.2 mg/dL Final  04/09/2013 04:04 PM 1.2 0.4 - 1.2 mg/dL Final  01/08/2013 03:25 PM 2.3* 0.4 - 1.2 mg/dL Final   09/08/2012 04:51 AM 1.40* 0.50 - 1.10 mg/dL Final  09/07/2012 07:24 AM 1.47* 0.50 - 1.10 mg/dL Final  09/06/2012 05:47 AM 2.02* 0.50 - 1.10 mg/dL Final  09/05/2012 06:00 AM 2.42* 0.50 - 1.10 mg/dL Final  09/04/2012 07:17 PM 2.74* 0.50 - 1.10 mg/dL Final  09/03/2012 05:30 PM 2.44* 0.50 - 1.10 mg/dL Final  09/03/2012 05:35 AM 2.39* 0.50 - 1.10 mg/dL Final     PMHx:   Past Medical History  Diagnosis Date  . Uterine cancer ([place])   . Diabetes mellitus     type II; peripheral neuropathy  . Hypertension   . GERD (gastroesophageal reflux disease)   . Peripheral vascular disease ([place])     a. s/p L BKA 08/2012.  . Obesity   . Dyslipidemia   . Chronic combined systolic and diastolic CHF (congestive heart failure) (H[facility])     a. EF 50-55%, mild LVH and grade 1 diast. Dysfxn b. Grade 3 Diastolic Dysfunction 93/7169. b. 02/2015: EF 45-50% by cath.  . Osteoarthritis cervical spine   . DM retinopathy ([place])   . Sleep apnea     with CPAP  . History of shingles   . COPD (chronic obstructive pulmonary disease) ([place])   . Depression   . Peptic ulcer disease     duodenal  . Peptic stricture of esophagus   . Hiatal hernia   . Diverticulosis   . Arthritis   . CAD (coronary artery disease)     a. s/p multiple caths with nonobs CAD;   b. cath 1/10: pLAD 20%, mLAD 40%, pCFX 20%, mCFX 40%, pRCA 60-70%;   c.  Myoview 06/02/12: Low anterior wall scar, no ischemia, EF 37%. d. cath 06/20/2014 70% mid LCx, medical therapy, high risk for PCI due to close proximity to large OM e. cath 03/07/15 pro RCA 50% & pro to mid Cx 75%, both stable, LVEDP 33-41m Hg-> medical management  . Cardiomyopathy ([place]     a. Echo 05/31/12: EF 40-45%. b. Echo 06/2014: EF 50-55%. c. Cath 02/2015: EF 45-50%.  . Asthma     Mild  . Pruritic condition     Idiopathic  . Zoster   . Noncompliance   . CKD (chronic kidney disease), stage III     Dr. DLorrene Reid . Bradycardia     a. Not on BB due to this.  . Shortness of breath dyspnea      Past Surgical History  Procedure Laterality Date  . Tubal ligation  1967  . Knee arthroscopy  10/1998    Left  . Craniotomy  1997    Left for SDH  . Cataract extraction, bilateral  2005  . Hernia repair    . Esophagogastroduodenoscopy  04/04/2004  . Spine surgery      C-spine and lumbar surgery  .  Cholecystectomy  2010  . Cardiac catheterization    . Dexa  7/05  . Amputation Left 09/04/2012    Procedure: AMPUTATION BELOW KNEE;  Surgeon: Angelia Mould, MD;  Location: [place];  Service: Vascular;  Laterality: Left;  . Abdominal angiogram N/A 08/31/2012    Procedure: ABDOMINAL ANGIOGRAM with runoff poss intervention;  Surgeon: Angelia Mould, MD;  Location: [facility] CATH LAB;  Service: Cardiovascular;  Laterality: N/A;  . Tubal ligation    . Left heart catheterization with coronary angiogram N/A 06/22/2014    Procedure: LEFT HEART CATHETERIZATION WITH CORONARY ANGIOGRAM;  Surgeon: Burnell Blanks, MD;  Location: [facility] CATH LAB;  Service: Cardiovascular;  Laterality: N/A;  . Eye surgery    . Brain surgery      1997 blood clot on the brain, then had to relieve fluid on the brain  . Multiple extractions with alveoloplasty N/A 08/01/2014    Procedure: EXTRACTIONS OF TEETH NUMBERS _0 AND 19 AND ALVEOLOPLASTY UPPER LEFT AND RIGHT QUADRANT;  Surgeon: Isac Caddy, DDS;  Location: [place];  Service: Oral Surgery;  Laterality: N/A;  . Cardiac catheterization N/A 03/07/2015    Procedure: Left Heart Cath and Coronary Angiography;  Surgeon: Leonie Man, MD;  Location: [place] CV LAB;  Service: Cardiovascular;  Laterality: N/A;    Family Hx:  Family History  Problem Relation Age of Onset  . Cancer - Other Mother     "Stomach" Cancer  . Diabetes Mother   . Heart disease Mother   . Stomach cancer Mother   . Hypertension Mother   . Lymphoma Father   . Hypertension Father   . Kidney disease Paternal Grandmother   . Asthma Other   . Diabetes Sister   . Rheum  arthritis Mother   . Rheum arthritis Father   . Heart attack Neg Hx   . Stroke Paternal Grandmother     Social History:  reports that she has never smoked. She has never used smokeless tobacco. She reports that she does not drink alcohol or use illicit drugs.  Allergies:  Allergies  Allergen Reactions  . Iohexol Itching and Rash     Code: RASH, Desc: [place] ON PT'S CHART ALLERGIC TO IV DYE 09/04/07/RM, Onset Date: [id number]     Medications: Prior to Admission medications   Medication Sig Start Date End Date Taking? Authorizing Provider  acetaminophen (TYLENOL) 500 MG tablet Take 500 mg by mouth every 6 (six) hours as needed for mild pain.   Yes Historical Provider, MD  albuterol (PROVENTIL HFA;VENTOLIN HFA) 108 (90 BASE) MCG/ACT inhaler Inhale 2 puffs into the lungs every 4 (four) hours as needed for wheezing or shortness of breath. 04/19/15  Yes Renato Shin, MD  albuterol (PROVENTIL) (2.5 MG/3ML) 0.083% nebulizer solution Take 3 mLs (2.5 mg total) by nebulization every 4 (four) hours as needed for wheezing or shortness of breath. 12/08/13  Yes Robbie Lis, MD  allopurinol (ZYLOPRIM) 100 MG tablet TAKE 1 TABLET BY MOUTH DAILY 07/25/14  Yes Renato Shin, MD  aspirin EC 81 MG tablet Take 1 tablet (81 mg total) by mouth daily. 03/16/15  Yes Dayna N Dunn, PA-C  calcitRIOL (ROCALTROL) 0.25 MCG capsule TAKE ONE CAPSULE BY MOUTH EVERY DAY 04/29/14  Yes Renato Shin, MD  clopidogrel (PLAVIX) 75 MG tablet Take 75 mg by mouth daily.   Yes Historical Provider, MD  donepezil (ARICEPT) 5 MG tablet Take 1 tablet (5 mg total) by mouth at bedtime.  05/19/14  Yes Renato Shin, MD  feeding supplement (GLUCERNA SHAKE) LIQD Take 237 mLs by mouth every morning.    Yes Historical Provider, MD  Fluticasone-Salmeterol (ADVAIR DISKUS) 100-50 MCG/DOSE AEPB Inhale 1 puff into the lungs 2 (two) times daily. 04/19/15  Yes Renato Shin, MD  furosemide (LASIX) 40 MG tablet Take 1 tablet (40 mg total) by mouth  daily. Patient taking differently: Take 40 mg by mouth 2 (two) times daily.  04/19/15  Yes Renato Shin, MD  gabapentin (NEURONTIN) 300 MG capsule TAKE ONE CAPSULE BY MOUTH EVERY NIGHT AT BEDTIME 04/10/15  Yes Renato Shin, MD  HYDROcodone-acetaminophen (NORCO) 10-325 MG tablet Take 1 tablet by mouth every 6 (six) hours as needed for moderate pain.   Yes Historical Provider, MD  insulin NPH-regular Human (NOVOLIN 70/30) (70-30) 100 UNIT/ML injection Inject 14 Units into the skin daily with breakfast. Sliding scale. Only takes in the morning 04/19/15  Yes Renato Shin, MD  isosorbide mononitrate (IMDUR) 60 MG 24 hr tablet Take 60 mg by mouth daily. 04/05/15  Yes Historical Provider, MD  latanoprost (XALATAN) 0.005 % ophthalmic solution Place 1 drop into both eyes at bedtime.   Yes Historical Provider, MD  nitroGLYCERIN (NITROSTAT) 0.4 MG SL tablet Place 0.4 mg under the tongue every 5 (five) minutes as needed for chest pain.    Yes Historical Provider, MD  potassium chloride SA (K-DUR,KLOR-CON) 10 MEQ tablet Take 1 tablet (10 mEq total) by mouth daily. 03/09/15  Yes Bhavinkumar Bhagat, PA  ranolazine (RANEXA) 500 MG 12 hr tablet Take 1 tablet (500 mg total) by mouth 2 (two) times daily. 03/09/15  Yes Bhavinkumar Bhagat, PA  ZETIA 10 MG tablet TAKE 1 TABLET BY MOUTH EVERY MORNING 04/17/15  Yes Renato Shin, MD  allopurinol (ZYLOPRIM) 100 MG tablet TAKE 1 TABLET BY MOUTH EVERY DAY 04/26/15   Renato Shin, MD  amLODipine (NORVASC) 10 MG tablet TAKE 1 TABLET BY MOUTH DAILY 04/28/15   Renato Shin, MD  atorvastatin (LIPITOR) 40 MG tablet TAKE 1 TABLET BY MOUTH DAILY AT 6 PM 04/28/15   Renato Shin, MD  azithromycin (ZITHROMAX) 500 MG tablet Take 1 tablet (500 mg total) by mouth daily. Patient not taking: Reported on 04/24/2015 04/19/15   Renato Shin, MD  hydrALAZINE (APRESOLINE) 25 MG tablet TAKE 1 TABLET BY MOUTH THREE TIMES DAILY 04/28/15   Renato Shin, MD  isosorbide-hydrALAZINE (BIDIL) 20-37.5 MG tablet  Take 1 tablet by mouth 3 (three) times daily. 03/14/15   Mihai Croitoru, MD  ONE TOUCH ULTRA TEST test strip USE TO TEST BLOOD SUGAR TWICE DAILY. 11/30/14   Renato Shin, MD  pantoprazole (PROTONIX) 40 MG tablet TAKE 1 TABLET BY MOUTH TWICE DAILY 04/28/15   Renato Shin, MD  sertraline (ZOLOFT) 100 MG tablet TAKE 1 TABLET BY MOUTH EVERY DAY 04/28/15   Renato Shin, MD    I have reviewed the patient's current medications.  Labs:  Results for orders placed or performed during the hospital encounter of 04/24/15 (from the past 48 hour(s))  Glucose, capillary     Status: Abnormal   Collection Time: 04/28/15  4:51 PM  Result Value Ref Range   Glucose-Capillary 278 (H) 65 - 99 mg/dL  Glucose, capillary     Status: Abnormal   Collection Time: 04/28/15  9:48 PM  Result Value Ref Range   Glucose-Capillary 343 (H) 65 - 99 mg/dL   Comment 1 Notify RN    Comment 2 Document in Chart   Glucose, capillary  Status: Abnormal   Collection Time: 04/29/15  6:56 AM  Result Value Ref Range   Glucose-Capillary 341 (H) 65 - 99 mg/dL  Basic metabolic panel     Status: Abnormal   Collection Time: 04/29/15 11:02 AM  Result Value Ref Range   Sodium 134 (L) 135 - 145 mmol/L   Potassium 5.2 (H) 3.5 - 5.1 mmol/L   Chloride 95 (L) 101 - 111 mmol/L   CO2 24 22 - 32 mmol/L   Glucose, Bld 434 (H) 65 - 99 mg/dL   BUN 71 (H) 6 - 20 mg/dL   Creatinine, Ser 4.35 (H) 0.44 - 1.00 mg/dL   Calcium 8.5 (L) 8.9 - 10.3 mg/dL   GFR calc non Af Amer 9 (L) >60 mL/min   GFR calc Af Amer 10 (L) >60 mL/min    Comment: (NOTE) The eGFR has been calculated using the CKD EPI equation. This calculation has not been validated in all clinical situations. eGFR's persistently <60 mL/min signify possible Chronic Kidney Disease.    Anion gap 15 5 - 15  Glucose, capillary     Status: Abnormal   Collection Time: 04/29/15 11:21 AM  Result Value Ref Range   Glucose-Capillary 407 (H) 65 - 99 mg/dL   Comment 1 Notify RN   Glucose,  capillary     Status: Abnormal   Collection Time: 04/29/15  4:34 PM  Result Value Ref Range   Glucose-Capillary 480 (H) 65 - 99 mg/dL   Comment 1 Notify RN   Glucose, capillary     Status: Abnormal   Collection Time: 04/29/15  9:25 PM  Result Value Ref Range   Glucose-Capillary 355 (H) 65 - 99 mg/dL   Comment 1 Notify RN    Comment 2 Document in Chart   Basic metabolic panel     Status: Abnormal   Collection Time: 04/30/15  2:33 AM  Result Value Ref Range   Sodium 132 (L) 135 - 145 mmol/L   Potassium 5.9 (H) 3.5 - 5.1 mmol/L   Chloride 94 (L) 101 - 111 mmol/L   CO2 21 (L) 22 - 32 mmol/L   Glucose, Bld 324 (H) 65 - 99 mg/dL   BUN 87 (H) 6 - 20 mg/dL   Creatinine, Ser 5.37 (H) 0.44 - 1.00 mg/dL   Calcium 8.7 (L) 8.9 - 10.3 mg/dL   GFR calc non Af Amer 7 (L) >60 mL/min   GFR calc Af Amer 8 (L) >60 mL/min    Comment: (NOTE) The eGFR has been calculated using the CKD EPI equation. This calculation has not been validated in all clinical situations. eGFR's persistently <60 mL/min signify possible Chronic Kidney Disease.    Anion gap 17 (H) 5 - 15  Glucose, capillary     Status: Abnormal   Collection Time: 04/30/15  6:03 AM  Result Value Ref Range   Glucose-Capillary 275 (H) 65 - 99 mg/dL   Comment 1 Notify RN    Comment 2 Document in Chart   Glucose, capillary     Status: Abnormal   Collection Time: 04/30/15 11:13 AM  Result Value Ref Range   Glucose-Capillary 320 (H) 65 - 99 mg/dL   Comment 1 Notify RN   Vancomycin, random     Status: None   Collection Time: 04/30/15 11:23 AM  Result Value Ref Range   Vancomycin Rm 15 ug/mL    Comment:        Random Vancomycin therapeutic range is dependent on dosage and time of specimen  collection. A peak range is 20.0-40.0 ug/mL A trough range is 5.0-15.0 ug/mL             ROS:  A comprehensive review of systems was negative except for: Respiratory: positive for Shortness of breath  Physical Exam: Filed Vitals:   04/30/15  1334  BP: 128/60  Pulse: 51  Temp: 98.2 F (36.8 C)  Resp: 20     General: Obese, pleasant black female. Seems oriented but will go off on tangents HEENT: Pupils equal round and reactive to light, extra ocular motions intact, mucous members are moist Neck: Possible jugular venous distention Heart: Bradycardia Lungs: Coarse breath sounds bilaterally Abdomen: Obese, soft, nontender Extremities: Pitting edema to dependent areas Skin: Warm and dry Neuro: Alert. Can get off subject.  Assessment/Plan: 81 year old black female with multiple medical issues including CKD.  She is status post a hospitalization in September and comes back again with complaints of shortness of breath attributable to congestive heart failure versus pneumonia. On Appropriate treatment she's developed acute on chronic renal failure 1.Renal- acute on chronic renal failure since November 9. Cannot identify any specific nephrotoxins. As above, her blood pressure has been in the 110's she has been bradycardic. Possibly  ATN from hypoperfusion? All blood pressure medications have been stopped. Also in the differential would be interstitial nephritis from antibiotics.  Vanc related nephrotoxicity seems unlikely because the level is not high. This is a difficult situation because now her kidney numbers are quite abnormal. She is approaching the possible need for dialysis. I really don't feel like this would be a good idea in this 81 year old with multiple medical issues. I tried to broach the subject with her. She states that a lot of people in her family know a lot about dialysis but then she changed the subject to chemotherapy so I'm not really sure she understood what I was saying.  I am going to check a urinalysis for evidence of AIN. Don't think that she is obstructed as Foley catheter is in place.  We have to hope that if we can continue further for toxins renal function will start to plateau and improve 2. Hypertension/volume  -  she seems volume overloaded but blood pressures are soft. I will start on her dose Lasix that should help with her potassium as well 3. Hyperkalemia- she has been given Kayexalate. I'm going to start a little bit of Lasix as above 4. Anemia  - hemoglobin around 10. No action at this time   Bhavya Grand A 04/30/2015, 1:35 PM

## 2015-04-30 NOTE — Progress Notes (Signed)
[facility] PROGRESS NOTE    Progress Note   [name] [id number] DOB: 1930-04-14 DOA: 04/24/2015 PCP: Renato Shin, MD   Brief Narrative:   [name] is an 81 y.o. female patient with history of chronic combined CHF (EF 45-50 percent & grade 3 diastolic dysfunction by [facility] 02/2015), CAD, DM 2, HTN, HLD, OSA on nightly CPAP, PAD status post left BKA, stage III CKD, COPD/? Asthma, bradycardia (hence not on beta blockers), recent hospitalization 9/19-9/22 for unstable angina at which time cath showed stable anatomy, now presented to [facility] ED on 04/24/15 with worsening dyspnea, orthopnea, productive cough, pleuritic appearing chest pain but no fevers. Recently seen by PCP and started on azithromycin for suspected pneumonia & Lasix dose increased. Noncompliant with fluid and water restriction. Admitted for decompensated CHF & Pneumonia. At with new acute kidney failure  Assessment/Plan:   Acute respiratory failure with hypoxia (HCC) due to   Acute on chronic diastolic heart failure [facility]): He showed an ejection fraction of 45% with grade 3 diastolic heart failure, patient has been noncompliant with fluid and salt restriction. Cardiology was consulted. Continue hydralazine and Imdur. Beta blocker was held due to acute renal failure and sinus bradycardia Diuresis was stopped as her creatinine started to rise. She was giving her normal saline bolus. Basic metabolic panel showed a worsening creatinine. See below for further details. She has had poor urine output and her urine is significantly concentrated.  Possible healthcare associated pneumonia: On 11-May-2015 she started wheezing and complaining of cough with eating. Swallowing evaluation done and recommended a dysphagia 3 diet Cont IV Solu-Medrol and empiric antibiotic vancomycin and Fortaz. Start Levaquin.  Diabetes type 2 with renal complications: Start long-acting insulin plus sliding scale blood glucoses running  high.  AKI on Chronic kidney disease stage III: Also be new baseline 1.7-1.8. Her creatinine has been getting worse, she'll receive IV Lasix with no episodes of hypotension she is in IV Vanc for possible healthcare associated pneumonia which could be contributing to her acute renal failure. I will go ahead and stop vancomycin, van level pending. Check urinary sodium and urinary creatinine on admission her UA did not show any proteins or red cells. Consult renal.  Elevated troponins in the setting of Nonobstructive CAD: Likely due to demand ischemia without chest pain,  we'll continue statin aspirin Plavix and Ranexa hydralazine and Imdur. Beta blocker held due to sinus bradycardia.  Hyperlipidemia: Continue statin therapy.  Obstructive sleep apnea: Continues C-pap at night  History of gout: Continue Allopurinol  Sinus bradycardia: Her Aricept and beta blockers were held, cardiology following she continues to be bradycardic.  Hyperkalemia: She was given Kayexalate causing gluconate overnight I will go ahead and repeat her Kayexalate check a basic metabolic panel tomorrow. This likely due to her acute renal failure.    DVT Prophylaxis - Lovenox ordered.  Family Communication: husband Disposition Plan: Home in am Code Status:     Code Status Orders        Start     Ordered   04/24/15 2353  Full code   Continuous     04/24/15 2354        IV Access:    Peripheral IV   Procedures and diagnostic studies:   Dg Chest Port 1 View  05/11/2015  CLINICAL DATA:  Cough, wheezing for 2 weeks. EXAM: PORTABLE CHEST 1 VIEW COMPARISON:  04/26/2015 FINDINGS: Cardiomegaly. Decreasing lung volumes with increasing perihilar and lower lobe airspace opacities concerning for CHF. No effusions.  No acute bony abnormality. IMPRESSION: Cardiomegaly with increasing bilateral airspace disease, likely edema/ CHF. Low lung volumes. Electronically Signed   By: Rolm Baptise M.D.   On:  04/28/2015 11:11     Medical Consultants:    None.  Anti-Infectives:   Anti-infectives    Start     Dose/Rate Route Frequency Ordered Stop   05/02/15 0000  levofloxacin (LEVAQUIN) IVPB 500 mg     500 mg 100 mL/hr over 60 Minutes Intravenous Every 48 hours 04/30/15 0940     04/30/15 1200  vancomycin (VANCOCIN) IVPB 1000 mg/200 mL premix  Status:  Discontinued     1,000 mg 200 mL/hr over 60 Minutes Intravenous Every 48 hours 04/28/15 1254 04/30/15 0918   04/30/15 1100  levofloxacin (LEVAQUIN) IVPB 500 mg  Status:  Discontinued     500 mg 100 mL/hr over 60 Minutes Intravenous Every 48 hours 04/28/15 1052 04/29/15 0956   04/30/15 1000  levofloxacin (LEVAQUIN) IVPB 750 mg     750 mg 100 mL/hr over 90 Minutes Intravenous  Once 04/30/15 0944     04/28/15 1100  levofloxacin (LEVAQUIN) IVPB 750 mg     750 mg 100 mL/hr over 90 Minutes Intravenous  Once 04/28/15 1034 04/28/15 1830   04/28/15 1100  cefTAZidime (FORTAZ) 2 g in dextrose 5 % 50 mL IVPB  Status:  Discontinued     2 g 100 mL/hr over 30 Minutes Intravenous Every 24 hours 04/28/15 1037 04/30/15 0940   04/28/15 1100  vancomycin (VANCOCIN) IVPB 1000 mg/200 mL premix     1,000 mg 200 mL/hr over 60 Minutes Intravenous  Once 04/28/15 1048 04/28/15 1408   04/25/15 2100  vancomycin (VANCOCIN) IVPB 1000 mg/200 mL premix  Status:  Discontinued     1,000 mg 200 mL/hr over 60 Minutes Intravenous Every 24 hours 04/24/15 2032 04/26/15 1302   04/25/15 2030  cefTAZidime (FORTAZ) 2 g in dextrose 5 % 50 mL IVPB  Status:  Discontinued     2 g 100 mL/hr over 30 Minutes Intravenous Every 24 hours 04/24/15 2032 04/26/15 1302   04/25/15 0000  cefTAZidime (FORTAZ) 2 g in dextrose 5 % 50 mL IVPB  Status:  Discontinued     2 g 100 mL/hr over 30 Minutes Intravenous 3 times per day 04/24/15 2354 04/25/15 0018   04/24/15 2030  cefTAZidime (FORTAZ) 2 g in dextrose 5 % 50 mL IVPB  Status:  Discontinued     2 g 100 mL/hr over 30 Minutes Intravenous  Once  04/24/15 2025 04/24/15 2032   04/24/15 2030  vancomycin (VANCOCIN) IVPB 1000 mg/200 mL premix  Status:  Discontinued     1,000 mg 200 mL/hr over 60 Minutes Intravenous  Once 04/24/15 2025 04/24/15 2032      Subjective:    [name] she relates her breathing is better today., She relates she feels better today.  Objective:    Filed Vitals:   04/29/15 2100 04/29/15 2338 04/30/15 0537 04/30/15 0836  BP: 133/58  107/68   Pulse: 47 53 63 61  Temp: 97.9 F (36.6 C)  98.3 F (36.8 C)   TempSrc: Oral  Oral   Resp: 20 18 18 18   Height:      Weight:   88.61 kg (195 lb 5.6 oz)   SpO2: 100% 99% 94% 96%    Intake/Output Summary (Last 24 hours) at 04/30/15 0950 Last data filed at 04/30/15 0937  Gross per 24 hour  Intake   1070 ml  Output  225 ml  Net    845 ml   Filed Weights   04/28/15 0602 04/29/15 0520 04/30/15 0537  Weight: 87.9 kg (193 lb 12.6 oz) 88.742 kg (195 lb 10.2 oz) 88.61 kg (195 lb 5.6 oz)    Exam: Gen:  NAD Cardiovascular:  RRR, No M/R/G Chest and lungs:   Good air movement with,crackles on the right. Abdomen:  Abdomen soft, NT/ND, + BS Extremities:  No lower extremity edema, Left BKA.   Data Reviewed:    Labs: Basic Metabolic Panel:  Recent Labs Lab 04/25/15 0708 04/26/15 0315 04/27/15 0357 04/28/15 0620 04/29/15 1102 04/30/15 0233  NA 142 141 139 137 134* 132*  K 3.5 3.6 4.9 4.2 5.2* 5.9*  CL 101 101 100* 97* 95* 94*  CO2 28 30 27 28 24  21*  GLUCOSE 164* 194* 155* 206* 434* 324*  BUN 21* 21* 29* 45* 71* 87*  CREATININE 1.94* 1.88* 2.44* 3.10* 4.35* 5.37*  CALCIUM 8.7* 8.7* 8.5* 8.6* 8.5* 8.7*  MG 1.9  --   --   --   --   --    GFR Estimated Creatinine Clearance: 8.9 mL/min (by C-G formula based on Cr of 5.37). Liver Function Tests:  Recent Labs Lab 04/24/15 1738  AST 45*  ALT 32  ALKPHOS 111  BILITOT 1.0  PROT 6.3*  ALBUMIN 3.3*   No results for input(s): LIPASE, AMYLASE in the last 168 hours. No results for  input(s): AMMONIA in the last 168 hours. Coagulation profile No results for input(s): INR, PROTIME in the last 168 hours.  CBC:  Recent Labs Lab 04/24/15 1738 04/25/15 0051 04/25/15 0708 04/28/15 0620  WBC 6.8 6.6 7.7 10.5  NEUTROABS 5.0  --   --   --   HGB 10.5* 10.6* 10.8* 9.8*  HCT 29.2* 30.6* 33.7* 27.8*  MCV 92.1 88.7 86.2 89.4  PLT 365 308 PLATELET CLUMPS NOTED ON SMEAR, COUNT APPEARS ADEQUATE 333   Cardiac Enzymes:  Recent Labs Lab 04/24/15 1738 04/25/15 0051 04/25/15 0708 04/25/15 1203  TROPONINI 0.17* 0.16* 0.16* 0.15*   BNP (last 3 results)  Recent Labs  06/08/14 1617  PROBNP 1249.0*   CBG:  Recent Labs Lab 04/29/15 0656 04/29/15 1121 04/29/15 1634 04/29/15 2125 04/30/15 0603  GLUCAP 341* 407* 480* 355* 275*   D-Dimer: No results for input(s): DDIMER in the last 72 hours. Hgb A1c: No results for input(s): HGBA1C in the last 72 hours. Lipid Profile: No results for input(s): CHOL, HDL, LDLCALC, TRIG, CHOLHDL, LDLDIRECT in the last 72 hours. Thyroid function studies: No results for input(s): TSH, T4TOTAL, T3FREE, THYROIDAB in the last 72 hours.  Invalid input(s): FREET3 Anemia work up: No results for input(s): VITAMINB12, FOLATE, FERRITIN, TIBC, IRON, RETICCTPCT in the last 72 hours. Sepsis Labs:  Recent Labs Lab 04/24/15 1738 04/24/15 1758 04/24/15 2059 04/25/15 0051 04/25/15 0708 04/28/15 0620  PROCALCITON  --   --  <0.10  --   --   --   WBC 6.8  --   --  6.6 7.7 10.5  LATICACIDVEN  --  1.14  --   --   --   --    Microbiology Recent Results (from the past 240 hour(s))  Blood culture (routine x 2)     Status: None   Collection Time: 04/24/15  8:54 PM  Result Value Ref Range Status   Specimen Description BLOOD LEFT ANTECUBITAL  Final   Special Requests BOTTLES DRAWN AEROBIC AND ANAEROBIC 5CC   Final   Culture  NO GROWTH 5 DAYS  Final   Report Status 04/29/2015 FINAL  Final  Blood culture (routine x 2)     Status: None    Collection Time: 04/24/15  8:59 PM  Result Value Ref Range Status   Specimen Description BLOOD RIGHT ANTECUBITAL  Final   Special Requests BOTTLES DRAWN AEROBIC AND ANAEROBIC 5CC   Final   Culture NO GROWTH 5 DAYS  Final   Report Status 04/29/2015 FINAL  Final  MRSA PCR Screening     Status: None   Collection Time: 04/24/15 10:51 PM  Result Value Ref Range Status   MRSA by PCR NEGATIVE NEGATIVE Final    Comment:        The GeneXpert MRSA Assay (FDA approved for NASAL specimens only), is one component of a comprehensive MRSA colonization surveillance program. It is not intended to diagnose MRSA infection nor to guide or monitor treatment for MRSA infections.      Medications:   . allopurinol  100 mg Oral Daily  . aspirin EC  81 mg Oral Daily  . atorvastatin  40 mg Oral q1800  . calcitRIOL  0.25 mcg Oral Daily  . clopidogrel  75 mg Oral Daily  . dextrose      . enoxaparin (LOVENOX) injection  30 mg Subcutaneous QHS  . ezetimibe  10 mg Oral q morning - 10a  . feeding supplement (GLUCERNA SHAKE)  237 mL Oral q morning - 10a  . gabapentin  300 mg Oral QHS  . insulin aspart  0-15 Units Subcutaneous TID WC  . insulin aspart  0-5 Units Subcutaneous QHS  . insulin aspart  3 Units Subcutaneous TID WC  . insulin detemir  20 Units Subcutaneous BID  . ipratropium-albuterol  3 mL Nebulization TID  . isosorbide mononitrate  60 mg Oral Daily  . latanoprost  1 drop Both Eyes QHS  . [START ON 05/02/2015] levofloxacin (LEVAQUIN) IV  500 mg Intravenous Q48H  . levofloxacin (LEVAQUIN) IV  750 mg Intravenous Once  . methylPREDNISolone (SOLU-MEDROL) injection  40 mg Intravenous Q12H  . mometasone-formoterol  2 puff Inhalation BID  . pantoprazole  40 mg Oral BID  . ranolazine  500 mg Oral BID  . sertraline  100 mg Oral Daily  . sodium chloride  3 mL Intravenous Q12H   Continuous Infusions:   Time spent: 25 min   LOS: 6 days   Charlynne Cousins  [facility] Pager  [phone]  *Please refer to [email or web address], password [email or web address] to get updated schedule on who will round on this patient, as hospitalists switch teams weekly. If 7PM-7AM, please contact night-coverage at [email or web address], password [email or web address] for any overnight needs.  04/30/2015, 9:50 AM

## 2015-05-01 ENCOUNTER — Inpatient Hospital Stay (HOSPITAL_COMMUNITY): Payer: Medicare Other

## 2015-05-01 DIAGNOSIS — N179 Acute kidney failure, unspecified: Secondary | ICD-10-CM | POA: Insufficient documentation

## 2015-05-01 DIAGNOSIS — I5043 Acute on chronic combined systolic (congestive) and diastolic (congestive) heart failure: Secondary | ICD-10-CM

## 2015-05-01 DIAGNOSIS — E785 Hyperlipidemia, unspecified: Secondary | ICD-10-CM

## 2015-05-01 LAB — GLUCOSE, CAPILLARY
Glucose-Capillary: 219 mg/dL — ABNORMAL HIGH (ref 65–99)
Glucose-Capillary: 360 mg/dL — ABNORMAL HIGH (ref 65–99)
Glucose-Capillary: 371 mg/dL — ABNORMAL HIGH (ref 65–99)
Glucose-Capillary: 218 mg/dL — ABNORMAL HIGH (ref 65–99)

## 2015-05-01 LAB — BASIC METABOLIC PANEL
Anion gap: 16 — ABNORMAL HIGH (ref 5–15)
BUN: 107 mg/dL — AB (ref 6–20)
CALCIUM: 7.9 mg/dL — AB (ref 8.9–10.3)
CO2: 26 mmol/L (ref 22–32)
CREATININE: 6.49 mg/dL — AB (ref 0.44–1.00)
Chloride: 94 mmol/L — ABNORMAL LOW (ref 101–111)
GFR calc Af Amer: 6 mL/min — ABNORMAL LOW (ref >60)
GFR, EST NON AFRICAN AMERICAN: 5 mL/min — AB (ref >60)
GLUCOSE: 232 mg/dL — AB (ref 65–99)
POTASSIUM: 4 mmol/L (ref 3.5–5.1)
SODIUM: 136 mmol/L (ref 135–145)

## 2015-05-01 LAB — URINE CULTURE: CULTURE: NO GROWTH

## 2015-05-01 MED ORDER — ENOXAPARIN SODIUM 30 MG/0.3ML ~~LOC~~ SOLN
30.0000 mg | Freq: Every day | SUBCUTANEOUS | Status: AC
  Administered 2015-05-02 – 2015-05-13 (×12): 30 mg via SUBCUTANEOUS
  Filled 2015-05-01 (×12): qty 0.3

## 2015-05-01 NOTE — Consult Note (Signed)
Chief Complaint: Patient was seen in consultation today for tunneled dialysis catheter placement Chief Complaint  Patient presents with  . Pneumonia   at the request of Dr Moshe Cipro  Referring Physician(s): Dr Moshe Cipro  History of Present Illness: [name] is a 81 y.o. female   Pt with significant CAD Underwent cardiac catheterization and angioplasty Sept 2016 Actively on Plavix Baseline Cr was 1.6 to 2.0 at that time---pt with known CKD Cr did go up after cardiac cath but improved days later. Now pt admitted with SOB and noted Cr rising steadily Today at 6.49 Nephrology feels need for dialysis to begin and requesting tunneled dialysis catheter placement  Hx DM; HTN; COPD L BKA Pt on Plavix daily--LD 11/14 Will discuss with MD---may need to place temporary catheter for now    Discussed with Dr Henn---On Plavix secondary new cardiac pta 02/2015 Call into Dr Kathlene November pt come off Plavix and wait for tunneled cath??   Past Medical History  Diagnosis Date  . Uterine cancer ([place])   . Diabetes mellitus     type II; peripheral neuropathy  . Hypertension   . GERD (gastroesophageal reflux disease)   . Peripheral vascular disease ([place])     a. s/p L BKA 08/2012.  . Obesity   . Dyslipidemia   . Chronic combined systolic and diastolic CHF (congestive heart failure) ([facility])     a. EF 50-55%, mild LVH and grade 1 diast. Dysfxn b. Grade 3 Diastolic Dysfunction [id number]. b. 02/2015: EF 45-50% by cath.  . Osteoarthritis cervical spine   . DM retinopathy ([place])   . Sleep apnea     with CPAP  . History of shingles   . COPD (chronic obstructive pulmonary disease) ([place])   . Depression   . Peptic ulcer disease     duodenal  . Peptic stricture of esophagus   . Hiatal hernia   . Diverticulosis   . Arthritis   . CAD (coronary artery disease)     a. s/p multiple caths with nonobs CAD;   b. cath 1/10: pLAD 20%, mLAD 40%, pCFX 20%, mCFX 40%, pRCA 60-70%;   c.   Myoview 06/02/12: Low anterior wall scar, no ischemia, EF 37%. d. cath 06/20/2014 70% mid LCx, medical therapy, high risk for PCI due to close proximity to large OM e. cath 03/07/15 pro RCA 50% & pro to mid Cx 75%, both stable, LVEDP 33-50mm Hg-> medical management  . Cardiomyopathy ([place])     a. Echo 05/31/12: EF 40-45%. b. Echo 06/2014: EF 50-55%. c. Cath 02/2015: EF 45-50%.  . Asthma     Mild  . Pruritic condition     Idiopathic  . Zoster   . Noncompliance   . CKD (chronic kidney disease), stage III     Dr. Lorrene Reid  . Bradycardia     a. Not on BB due to this.  . Shortness of breath dyspnea     Past Surgical History  Procedure Laterality Date  . Tubal ligation  1967  . Knee arthroscopy  10/1998    Left  . Craniotomy  1997    Left for SDH  . Cataract extraction, bilateral  2005  . Hernia repair    . Esophagogastroduodenoscopy  04/04/2004  . Spine surgery      C-spine and lumbar surgery  . Cholecystectomy  2010  . Cardiac catheterization    . Dexa  7/05  . Amputation Left 09/04/2012    Procedure: AMPUTATION BELOW KNEE;  Surgeon: Harrell Gave  Nicole Cella, MD;  Location: [facility];  Service: Vascular;  Laterality: Left;  . Abdominal angiogram N/A 08/31/2012    Procedure: ABDOMINAL ANGIOGRAM with runoff poss intervention;  Surgeon: Angelia Mould, MD;  Location: [facility] CATH LAB;  Service: Cardiovascular;  Laterality: N/A;  . Tubal ligation    . Left heart catheterization with coronary angiogram N/A 06/22/2014    Procedure: LEFT HEART CATHETERIZATION WITH CORONARY ANGIOGRAM;  Surgeon: Burnell Blanks, MD;  Location: [facility] CATH LAB;  Service: Cardiovascular;  Laterality: N/A;  . Eye surgery    . Brain surgery      1997 blood clot on the brain, then had to relieve fluid on the brain  . Multiple extractions with alveoloplasty N/A 08/01/2014    Procedure: EXTRACTIONS OF TEETH NUMBERS 7 8 9 10 11  AND 19 AND ALVEOLOPLASTY UPPER LEFT AND RIGHT QUADRANT;  Surgeon: Isac Caddy, DDS;   Location: [facility];  Service: Oral Surgery;  Laterality: N/A;  . Cardiac catheterization N/A 03/07/2015    Procedure: Left Heart Cath and Coronary Angiography;  Surgeon: Leonie Man, MD;  Location: [facility] CV LAB;  Service: Cardiovascular;  Laterality: N/A;    Allergies: Iohexol  Medications: Prior to Admission medications   Medication Sig Start Date End Date Taking? Authorizing Provider  acetaminophen (TYLENOL) 500 MG tablet Take 500 mg by mouth every 6 (six) hours as needed for mild pain.   Yes Historical Provider, MD  albuterol (PROVENTIL HFA;VENTOLIN HFA) 108 (90 BASE) MCG/ACT inhaler Inhale 2 puffs into the lungs every 4 (four) hours as needed for wheezing or shortness of breath. 04/19/15  Yes Renato Shin, MD  albuterol (PROVENTIL) (2.5 MG/3ML) 0.083% nebulizer solution Take 3 mLs (2.5 mg total) by nebulization every 4 (four) hours as needed for wheezing or shortness of breath. 12/08/13  Yes Robbie Lis, MD  allopurinol (ZYLOPRIM) 100 MG tablet TAKE 1 TABLET BY MOUTH DAILY 07/25/14  Yes Renato Shin, MD  aspirin EC 81 MG tablet Take 1 tablet (81 mg total) by mouth daily. 03/16/15  Yes Dayna N Dunn, PA-C  calcitRIOL (ROCALTROL) 0.25 MCG capsule TAKE ONE CAPSULE BY MOUTH EVERY DAY 04/29/14  Yes Renato Shin, MD  clopidogrel (PLAVIX) 75 MG tablet Take 75 mg by mouth daily.   Yes Historical Provider, MD  donepezil (ARICEPT) 5 MG tablet Take 1 tablet (5 mg total) by mouth at bedtime. 05/19/14  Yes Renato Shin, MD  feeding supplement (GLUCERNA SHAKE) LIQD Take 237 mLs by mouth every morning.    Yes Historical Provider, MD  Fluticasone-Salmeterol (ADVAIR DISKUS) 100-50 MCG/DOSE AEPB Inhale 1 puff into the lungs 2 (two) times daily. 04/19/15  Yes Renato Shin, MD  furosemide (LASIX) 40 MG tablet Take 1 tablet (40 mg total) by mouth daily. Patient taking differently: Take 40 mg by mouth 2 (two) times daily.  04/19/15  Yes Renato Shin, MD  gabapentin (NEURONTIN) 300 MG capsule TAKE ONE CAPSULE BY  MOUTH EVERY NIGHT AT BEDTIME 04/10/15  Yes Renato Shin, MD  HYDROcodone-acetaminophen (NORCO) 10-325 MG tablet Take 1 tablet by mouth every 6 (six) hours as needed for moderate pain.   Yes Historical Provider, MD  insulin NPH-regular Human (NOVOLIN 70/30) (70-30) 100 UNIT/ML injection Inject 14 Units into the skin daily with breakfast. Sliding scale. Only takes in the morning 04/19/15  Yes Renato Shin, MD  isosorbide mononitrate (IMDUR) 60 MG 24 hr tablet Take 60 mg by mouth daily. 04/05/15  Yes Historical Provider, MD  latanoprost (XALATAN) 0.005 % ophthalmic  solution Place 1 drop into both eyes at bedtime.   Yes Historical Provider, MD  nitroGLYCERIN (NITROSTAT) 0.4 MG SL tablet Place 0.4 mg under the tongue every 5 (five) minutes as needed for chest pain.    Yes Historical Provider, MD  potassium chloride SA (K-DUR,KLOR-CON) 10 MEQ tablet Take 1 tablet (10 mEq total) by mouth daily. 03/09/15  Yes Bhavinkumar Bhagat, PA  ranolazine (RANEXA) 500 MG 12 hr tablet Take 1 tablet (500 mg total) by mouth 2 (two) times daily. 03/09/15  Yes Bhavinkumar Bhagat, PA  ZETIA 10 MG tablet TAKE 1 TABLET BY MOUTH EVERY MORNING 04/17/15  Yes Renato Shin, MD  allopurinol (ZYLOPRIM) 100 MG tablet TAKE 1 TABLET BY MOUTH EVERY DAY 04/26/15   Renato Shin, MD  amLODipine (NORVASC) 10 MG tablet TAKE 1 TABLET BY MOUTH DAILY 04/28/15   Renato Shin, MD  atorvastatin (LIPITOR) 40 MG tablet TAKE 1 TABLET BY MOUTH DAILY AT 6 PM 04/28/15   Renato Shin, MD  azithromycin (ZITHROMAX) 500 MG tablet Take 1 tablet (500 mg total) by mouth daily. Patient not taking: Reported on 04/24/2015 04/19/15   Renato Shin, MD  hydrALAZINE (APRESOLINE) 25 MG tablet TAKE 1 TABLET BY MOUTH THREE TIMES DAILY 04/28/15   Renato Shin, MD  isosorbide-hydrALAZINE (BIDIL) 20-37.5 MG tablet Take 1 tablet by mouth 3 (three) times daily. 03/14/15   Mihai Croitoru, MD  ONE TOUCH ULTRA TEST test strip USE TO TEST BLOOD SUGAR TWICE DAILY. 11/30/14   Renato Shin,  MD  pantoprazole (PROTONIX) 40 MG tablet TAKE 1 TABLET BY MOUTH TWICE DAILY 04/28/15   Renato Shin, MD  sertraline (ZOLOFT) 100 MG tablet TAKE 1 TABLET BY MOUTH EVERY DAY 04/28/15   Renato Shin, MD     Family History  Problem Relation Age of Onset  . Cancer - Other Mother     "Stomach" Cancer  . Diabetes Mother   . Heart disease Mother   . Stomach cancer Mother   . Hypertension Mother   . Lymphoma Father   . Hypertension Father   . Kidney disease Paternal Grandmother   . Asthma Other   . Diabetes Sister   . Rheum arthritis Mother   . Rheum arthritis Father   . Heart attack Neg Hx   . Stroke Paternal Grandmother     Social History   Social History  . Marital Status: Married    Spouse Name: N/A  . Number of Children: 7  . Years of Education: N/A   Occupational History  . Retired    Social History Main Topics  . Smoking status: Never Smoker   . Smokeless tobacco: Never Used  . Alcohol Use: No     Comment: rare  . Drug Use: No  . Sexual Activity: No   Other Topics Concern  . None   Social History Narrative    Review of Systems: A 12 point ROS discussed and pertinent positives are indicated in the HPI above.  All other systems are negative.  Review of Systems  Constitutional: Positive for activity change and fatigue. Negative for fever.  Respiratory: Positive for cough, shortness of breath and wheezing.   Genitourinary: Positive for decreased urine volume.  Musculoskeletal: Negative for joint swelling.  Neurological: Negative for weakness.  Psychiatric/Behavioral: Negative for behavioral problems and confusion.    Vital Signs: BP 141/100 mmHg  Pulse 51  Temp(Src) 97.7 F (36.5 C) (Oral)  Resp 18  Ht 5\' 6"  (1.676 m)  Wt 198 lb 12.8 oz (90.175 kg)  BMI 32.10 kg/m2  SpO2 93%  Physical Exam  Constitutional: She is oriented to person, place, and time.  Cardiovascular: Normal rate and regular rhythm.   Pulmonary/Chest: Effort normal. She has wheezes.    Abdominal: Soft. Bowel sounds are normal. She exhibits distension. There is no tenderness.  Musculoskeletal: Normal range of motion.  L BKA  Neurological: She is alert and oriented to person, place, and time.  Skin: Skin is warm and dry.  Psychiatric: She has a normal mood and affect. Her behavior is normal. Judgment and thought content normal.  Nursing note and vitals reviewed.   Mallampati Score:  MD Evaluation Airway: WNL Heart: WNL Abdomen: WNL Chest/ Lungs: WNL ASA  Classification: 3 Mallampati/Airway Score: One  Imaging: Dg Chest 2 View  04/26/2015  CLINICAL DATA:  Shortness of breath. EXAM: CHEST  2 VIEW COMPARISON:  04/24/2015. FINDINGS: Cardiomegaly with pulmonary vascular prominence and mild interstitial prominence, improved from prior exam. Findings consistent with improving congestive heart failure. Cardiomegaly remains severe. Underlying process such as pericardial effusion, cardiomyopathy, or cardiac valve disease cannot be excluded . No pleural effusion or pneumothorax . IMPRESSION: 1. Improving congestive heart failure. 2. Persistent severe cardiomegaly. Underlying process such as pericardial fusion, cardiomyopathy, cardiac valve disease cannot be excluded . Electronically Signed   By: Marcello Moores  Register   On: 04/26/2015 07:47   Dg Chest 2 View  04/24/2015  CLINICAL DATA:  Five day history of upper urinary tract infection. No improvement after antibiotics. Productive cough. EXAM: CHEST  2 VIEW COMPARISON:  04/19/2015 FINDINGS: The heart is enlarged but appears relatively stable. There is tortuosity and calcification of the thoracic aorta. Diffuse bilateral airspace process could reflect pulmonary edema or bilateral infiltrates. No definite pleural effusions. The bony thorax is intact. IMPRESSION: Cardiac enlargement. Diffuse airspace process could reflect pulmonary edema or bilateral infiltrates. No definite pleural effusions. Electronically Signed   By: Marijo Sanes M.D.    On: 04/24/2015 17:16   Dg Chest 2 View  04/19/2015  CLINICAL DATA:  Cough for 1 week.  Wheezing for 2 days.  Congestion. EXAM: CHEST  2 VIEW COMPARISON:  03/06/2015 FINDINGS: There is cardiomegaly with vascular congestion and mild perihilar opacities concerning for early edema. No effusions. No acute bony abnormality. IMPRESSION: Findings concerning for mild CHF. Electronically Signed   By: Rolm Baptise M.D.   On: 04/19/2015 12:42   US Renal  05/01/2015  CLINICAL DATA:  Renal failure. EXAM: RENAL / URINARY TRACT ULTRASOUND COMPLETE COMPARISON:  CT, 12/05/2013 FINDINGS: Right Kidney: Length: 10.6 cm. Echogenicity within normal limits. No mass or hydronephrosis visualized. Left Kidney: Length: 10.3 cm. Echogenicity within normal limits. No mass or hydronephrosis visualized. Bladder: Decompressed with a Foley catheter IMPRESSION: Normal renal ultrasound. Electronically Signed   By: Lajean Manes M.D.   On: 05/01/2015 12:46   Dg Chest Port 1 View  04/28/2015  CLINICAL DATA:  Cough, wheezing for 2 weeks. EXAM: PORTABLE CHEST 1 VIEW COMPARISON:  04/26/2015 FINDINGS: Cardiomegaly. Decreasing lung volumes with increasing perihilar and lower lobe airspace opacities concerning for CHF. No effusions. No acute bony abnormality. IMPRESSION: Cardiomegaly with increasing bilateral airspace disease, likely edema/ CHF. Low lung volumes. Electronically Signed   By: Rolm Baptise M.D.   On: 04/28/2015 11:11    Labs:  CBC:  Recent Labs  04/24/15 1738 04/25/15 0051 04/25/15 0708 04/28/15 0620  WBC 6.8 6.6 7.7 10.5  HGB 10.5* 10.6* 10.8* 9.8*  HCT 29.2* 30.6* 33.7* 27.8*  PLT 365 308 PLATELET CLUMPS NOTED  ON SMEAR, COUNT APPEARS ADEQUATE 333    COAGS:  Recent Labs  06/17/14 1538 06/17/14 1946 06/18/14 0625 03/07/15 0034  INR 1.06 1.19 1.18 1.23  APTT 32 128*  --   --     BMP:  Recent Labs  04/28/15 0620 04/29/15 1102 04/30/15 0233 05/01/15 0454  NA 137 134* 132* 136  K 4.2 5.2* 5.9* 4.0    CL 97* 95* 94* 94*  CO2 28 24 21* 26  GLUCOSE 206* 434* 324* 232*  BUN 45* 71* 87* 107*  CALCIUM 8.6* 8.5* 8.7* 7.9*  CREATININE 3.10* 4.35* 5.37* 6.49*  GFRNONAA 13* 9* 7* 5*  GFRAA 15* 10* 8* 6*    LIVER FUNCTION TESTS:  Recent Labs  06/17/14 1946 08/18/14 1119 10/25/14 1114 04/24/15 1738  BILITOT 1.2 0.4 0.6 1.0  AST 34 21 17 45*  ALT 22 11 10  32  ALKPHOS 103 90 90 111  PROT 6.9 6.8 6.6 6.3*  ALBUMIN 3.8 4.0 3.7 3.3*    TUMOR MARKERS: No results for input(s): AFPTM, CEA, CA199, CHROMGRNA in the last 8760 hours.  Assessment and Plan:  CKD Worsening renal fxn Cr 6.49 today Need for dialysis per Nephrology Now scheduled for tunneled Hemodialysis catheter placement (Pt on Plavix now---may need to place temporary catheter for now---will discuss with MD and Rad) Risks and Benefits discussed with the patient including, but not limited to bleeding, infection, vascular injury, pneumothorax which may require chest tube placement, air embolism or even death All of the patient's questions were answered, patient is agreeable to proceed. Consent signed and in chart.   Thank you for this interesting consult.  I greatly enjoyed meeting [name] and look forward to participating in their care.  A copy of this report was sent to the requesting provider on this date.  Signed: Deletha Jaffee A 05/01/2015, 2:03 PM   I spent a total of 20 Minutes    in face to face in clinical consultation, greater than 50% of which was counseling/coordinating care for HD cath placement

## 2015-05-01 NOTE — Progress Notes (Signed)
Inpatient Diabetes Program Recommendations  AACE/ADA: New Consensus Statement on Inpatient Glycemic Control (2015)  Target Ranges:  Prepandial:   less than 140 mg/dL      Peak postprandial:   less than 180 mg/dL (1-2 hours)      Critically ill patients:  140 - 180 mg/dL   Review of Glycemic Control  Diabetes history: DM 2 Outpatient Diabetes medications: 70/30 14 units Daily Current orders for Inpatient glycemic control: Levemir 20 units BID, Novolog Moderate + HS + Novolog 3 units TID meal coverage  Inpatient Diabetes Program Recommendations: Insulin - Basal: Patient on IV solumedrol still. Patient's glucose is in the 200-300 range. Please consider increasing basal insulin to Levemir 24 units BID.  Thanks,  Tama Headings RN, MSN, [facility] Inpatient Diabetes Coordinator Team Pager [phone] (8a-5p)

## 2015-05-01 NOTE — Care Management Important Message (Signed)
Important Message  Patient Details  Name: [name] MRN: [id number] Date of Birth: 04-25-1930   Medicare Important Message Given:  Yes    Lucky Trotta P Addiel Mccardle 05/01/2015, 2:37 PM

## 2015-05-01 NOTE — Progress Notes (Signed)
[facility] PROGRESS NOTE    Progress Note   [name] DOB: [birth year] DOA: 04/24/2015 PCP: Renato Shin, MD   Brief Narrative:   [name] is an 81 y.o. female patient with history of chronic combined CHF (EF 45-50 percent & grade 3 diastolic dysfunction by [facility] 02/2015), CAD, DM 2, HTN, HLD, OSA on nightly CPAP, PAD status post left BKA, stage III CKD, COPD/? Asthma, bradycardia (hence not on beta blockers), recent hospitalization 9/19-9/22 for unstable angina at which time cath showed stable anatomy, now presented to [facility] ED on 04/24/15 with worsening dyspnea, orthopnea, productive cough, pleuritic appearing chest pain but no fevers. Recently seen by PCP and started on azithromycin for suspected pneumonia & Lasix dose increased. Noncompliant with fluid and water restriction. Admitted for decompensated CHF & Pneumonia. At with new acute kidney failure  Assessment/Plan:   Acute respiratory failure with hypoxia (HCC) due to   Acute on chronic diastolic heart failure [facility]): Appreciate cardiology's assistance Continue hydralazine and Imdur. Beta blocker was held due to acute renal failure and sinus bradycardia Diuresis was stopped as her creatinine started to rise.  She has had poor urine output and her urine is significantly concentrated.  Possible healthcare associated pneumonia: On 04/28/2015 she started wheezing and complaining of cough with eating. Swallowing evaluation done and recommended a dysphagia 3 diet Cont oral levaquin.  Diabetes type 2 with renal complications: Start long-acting insulin plus sliding scale blood glucoses running high.  AKI on Chronic kidney disease stage III: Appreciate renal's assistance. ? Due to  ATN. Her renal function continues to deteriorate, further management per renal.   Elevated troponins in the setting of Nonobstructive CAD: Likely due to demand ischemia without chest pain, Continue statin aspirin Plavix  and Ranexa hydralazine and Imdur. Beta blocker held due to sinus bradycardia.  Hyperlipidemia: Continue statin therapy.  Obstructive sleep apnea: Continues C-pap at night  History of gout: Continue Allopurinol  Sinus bradycardia: Her Aricept and beta blockers were held, cardiology following she continues to be bradycardic.  Hyperkalemia: Resolved with Kayexelate. This likely due to her acute renal failure.    DVT Prophylaxis - Lovenox ordered.  Family Communication: husband Disposition Plan: Home in am Code Status:     Code Status Orders        Start     Ordered   04/24/15 2353  Full code   Continuous     04/24/15 2354        IV Access:    Peripheral IV   Procedures and diagnostic studies:   No results found.   Medical Consultants:    None.  Anti-Infectives:   Anti-infectives    Start     Dose/Rate Route Frequency Ordered Stop   05/02/15 1000  levofloxacin (LEVAQUIN) IVPB 500 mg     500 mg 100 mL/hr over 60 Minutes Intravenous Every 48 hours 04/30/15 0940     04/30/15 1200  vancomycin (VANCOCIN) IVPB 1000 mg/200 mL premix  Status:  Discontinued     1,000 mg 200 mL/hr over 60 Minutes Intravenous Every 48 hours 04/28/15 1254 04/30/15 0918   04/30/15 1100  levofloxacin (LEVAQUIN) IVPB 500 mg  Status:  Discontinued     500 mg 100 mL/hr over 60 Minutes Intravenous Every 48 hours 04/28/15 1052 04/29/15 0956   04/30/15 1000  levofloxacin (LEVAQUIN) IVPB 750 mg     750 mg 100 mL/hr over 90 Minutes Intravenous  Once 04/30/15 0944 04/30/15 1206   04/28/15 1100  levofloxacin (  LEVAQUIN) IVPB 750 mg     750 mg 100 mL/hr over 90 Minutes Intravenous  Once 04/28/15 1034 04/28/15 1830   04/28/15 1100  cefTAZidime (FORTAZ) 2 g in dextrose 5 % 50 mL IVPB  Status:  Discontinued     2 g 100 mL/hr over 30 Minutes Intravenous Every 24 hours 04/28/15 1037 04/30/15 0940   04/28/15 1100  vancomycin (VANCOCIN) IVPB 1000 mg/200 mL premix     1,000 mg 200 mL/hr over  60 Minutes Intravenous  Once 04/28/15 1048 04/28/15 1408   04/25/15 2100  vancomycin (VANCOCIN) IVPB 1000 mg/200 mL premix  Status:  Discontinued     1,000 mg 200 mL/hr over 60 Minutes Intravenous Every 24 hours 04/24/15 2032 04/26/15 1302   04/25/15 2030  cefTAZidime (FORTAZ) 2 g in dextrose 5 % 50 mL IVPB  Status:  Discontinued     2 g 100 mL/hr over 30 Minutes Intravenous Every 24 hours 04/24/15 2032 04/26/15 1302   04/25/15 0000  cefTAZidime (FORTAZ) 2 g in dextrose 5 % 50 mL IVPB  Status:  Discontinued     2 g 100 mL/hr over 30 Minutes Intravenous 3 times per day 04/24/15 2354 04/25/15 0018   04/24/15 2030  cefTAZidime (FORTAZ) 2 g in dextrose 5 % 50 mL IVPB  Status:  Discontinued     2 g 100 mL/hr over 30 Minutes Intravenous  Once 04/24/15 2025 04/24/15 2032   04/24/15 2030  vancomycin (VANCOCIN) IVPB 1000 mg/200 mL premix  Status:  Discontinued     1,000 mg 200 mL/hr over 60 Minutes Intravenous  Once 04/24/15 2025 04/24/15 2032      Subjective:    [place] no complains.  Objective:    Filed Vitals:   04/30/15 2039 05/01/15 0400 05/01/15 0405 05/01/15 0810  BP:  126/63  141/100  Pulse:  50  51  Temp:  97.7 F (36.5 C)  97.7 F (36.5 C)  TempSrc:  Oral  Oral  Resp:  18  18  Height:      Weight:   90.175 kg (198 lb 12.8 oz)   SpO2: 96% 95%  93%    Intake/Output Summary (Last 24 hours) at 05/01/15 1229 Last data filed at 05/01/15 0957  Gross per 24 hour  Intake    460 ml  Output    350 ml  Net    110 ml   Filed Weights   04/29/15 0520 04/30/15 0537 05/01/15 0405  Weight: 88.742 kg (195 lb 10.2 oz) 88.61 kg (195 lb 5.6 oz) 90.175 kg (198 lb 12.8 oz)    Exam: Gen:  NAD Cardiovascular:  RRR, No M/R/G Chest and lungs:   Good air movement with,crackles on the right. Abdomen:  Abdomen soft, NT/ND, + BS Extremities:  No lower extremity edema, Left BKA.   Data Reviewed:    Labs: Basic Metabolic Panel:  Recent Labs Lab 04/25/15 0708   04/27/15 0357 04/28/15 0620 04/29/15 1102 04/30/15 0233 05/01/15 0454  NA 142  < > 139 137 134* 132* 136  K 3.5  < > 4.9 4.2 5.2* 5.9* 4.0  CL 101  < > 100* 97* 95* 94* 94*  CO2 28  < > 27 28 24  21* 26  GLUCOSE 164*  < > 155* 206* 434* 324* 232*  BUN 21*  < > 29* 45* 71* 87* 107*  CREATININE 1.94*  < > 2.44* 3.10* 4.35* 5.37* 6.49*  CALCIUM 8.7*  < > 8.5* 8.6* 8.5* 8.7* 7.9*  MG 1.9  --   --   --   --   --   --   < > = values in this interval not displayed. GFR Estimated Creatinine Clearance: 7.4 mL/min (by C-G formula based on Cr of 6.49). Liver Function Tests:  Recent Labs Lab 04/24/15 1738  AST 45*  ALT 32  ALKPHOS 111  BILITOT 1.0  PROT 6.3*  ALBUMIN 3.3*   No results for input(s): LIPASE, AMYLASE in the last 168 hours. No results for input(s): AMMONIA in the last 168 hours. Coagulation profile No results for input(s): INR, PROTIME in the last 168 hours.  CBC:  Recent Labs Lab 04/24/15 1738 04/25/15 0051 04/25/15 0708 04/28/15 0620  WBC 6.8 6.6 7.7 10.5  NEUTROABS 5.0  --   --   --   HGB 10.5* 10.6* 10.8* 9.8*  HCT 29.2* 30.6* 33.7* 27.8*  MCV 92.1 88.7 86.2 89.4  PLT 365 308 PLATELET CLUMPS NOTED ON SMEAR, COUNT APPEARS ADEQUATE 333   Cardiac Enzymes:  Recent Labs Lab 04/24/15 1738 04/25/15 0051 04/25/15 0708 04/25/15 1203  TROPONINI 0.17* 0.16* 0.16* 0.15*   BNP (last 3 results)  Recent Labs  06/08/14 1617  PROBNP 1249.0*   CBG:  Recent Labs Lab 04/30/15 0603 04/30/15 1113 04/30/15 1612 04/30/15 2052 05/01/15 0542  GLUCAP 275* 320* 244* 235* 219*   D-Dimer: No results for input(s): DDIMER in the last 72 hours. Hgb A1c: No results for input(s): HGBA1C in the last 72 hours. Lipid Profile: No results for input(s): CHOL, HDL, LDLCALC, TRIG, CHOLHDL, LDLDIRECT in the last 72 hours. Thyroid function studies: No results for input(s): TSH, T4TOTAL, T3FREE, THYROIDAB in the last 72 hours.  Invalid input(s): FREET3 Anemia work  up: No results for input(s): VITAMINB12, FOLATE, FERRITIN, TIBC, IRON, RETICCTPCT in the last 72 hours. Sepsis Labs:  Recent Labs Lab 04/24/15 1738 04/24/15 1758 04/24/15 2059 04/25/15 0051 04/25/15 0708 04/28/15 0620  PROCALCITON  --   --  <0.10  --   --   --   WBC 6.8  --   --  6.6 7.7 10.5  LATICACIDVEN  --  1.14  --   --   --   --    Microbiology Recent Results (from the past 240 hour(s))  Blood culture (routine x 2)     Status: None   Collection Time: 04/24/15  8:54 PM  Result Value Ref Range Status   Specimen Description BLOOD LEFT ANTECUBITAL  Final   Special Requests BOTTLES DRAWN AEROBIC AND ANAEROBIC 5CC   Final   Culture NO GROWTH 5 DAYS  Final   Report Status 04/29/2015 FINAL  Final  Blood culture (routine x 2)     Status: None   Collection Time: 04/24/15  8:59 PM  Result Value Ref Range Status   Specimen Description BLOOD RIGHT ANTECUBITAL  Final   Special Requests BOTTLES DRAWN AEROBIC AND ANAEROBIC 5CC   Final   Culture NO GROWTH 5 DAYS  Final   Report Status 04/29/2015 FINAL  Final  MRSA PCR Screening     Status: None   Collection Time: 04/24/15 10:51 PM  Result Value Ref Range Status   MRSA by PCR NEGATIVE NEGATIVE Final    Comment:        The GeneXpert MRSA Assay (FDA approved for NASAL specimens only), is one component of a comprehensive MRSA colonization surveillance program. It is not intended to diagnose MRSA infection nor to guide or monitor treatment for MRSA infections.  Medications:   . allopurinol  100 mg Oral Daily  . aspirin EC  81 mg Oral Daily  . atorvastatin  40 mg Oral q1800  . calcitRIOL  0.25 mcg Oral Daily  . clopidogrel  75 mg Oral Daily  . enoxaparin (LOVENOX) injection  30 mg Subcutaneous QHS  . ezetimibe  10 mg Oral q morning - 10a  . feeding supplement (GLUCERNA SHAKE)  237 mL Oral q morning - 10a  . furosemide  40 mg Intravenous Q12H  . gabapentin  300 mg Oral QHS  . insulin aspart  0-15 Units Subcutaneous TID  WC  . insulin aspart  0-5 Units Subcutaneous QHS  . insulin aspart  3 Units Subcutaneous TID WC  . insulin detemir  20 Units Subcutaneous BID  . ipratropium-albuterol  3 mL Nebulization TID  . isosorbide mononitrate  60 mg Oral Daily  . latanoprost  1 drop Both Eyes QHS  . [START ON 05/02/2015] levofloxacin (LEVAQUIN) IV  500 mg Intravenous Q48H  . methylPREDNISolone (SOLU-MEDROL) injection  40 mg Intravenous Q12H  . mometasone-formoterol  2 puff Inhalation BID  . pantoprazole  40 mg Oral BID  . ranolazine  500 mg Oral BID  . sertraline  100 mg Oral Daily  . sodium chloride  3 mL Intravenous Q12H   Continuous Infusions:   Time spent: 25 min   LOS: 7 days   Charlynne Cousins  [facility] Pager [phone]  *Please refer to [email or web address], password [email or web address] to get updated schedule on who will round on this patient, as hospitalists switch teams weekly. If 7PM-7AM, please contact night-coverage at [email or web address], password [email or web address] for any overnight needs.  05/01/2015, 12:29 PM

## 2015-05-01 NOTE — Progress Notes (Signed)
PT Cancellation Note  Patient Details Name: [name] MRN: [id number] DOB: 01-13-1930   Cancelled Treatment:    Reason Eval/Treat Not Completed: Medical issues which prohibited therapy (elevated BP, creatinine, BS and )other lab values.   Ramond Dial 05/01/2015, 11:15 AM   Mee Hives, PT MS Acute Rehab Dept. Number: [facility] [id number] and [place] [phone]

## 2015-05-01 NOTE — Progress Notes (Signed)
81 y.o. female with past medical history significant for diabetes mellitus, hypertension, hyperlipidemia, obesity, OSA on CPAP, COPD, coronary artery disease with also a mild cardiomyopathy EF in the 40s, PAD status post BKA as well as CKD appearing to have a creatinine of between 1.6 and 2.0 and is followed by Dr. Lorrene Reid at [facility].  Last cath 02/2015 non obstructed disease.  There was mild left ventricular systolic dysfunction. Severely elevated LVEDP of 33-38 mmHg suggestive of significant diastolic heart failure.  Subjective: No chest pain no SOB, head almost flat and no SOB  Objective: Vital signs in last 24 hours: Temp:  [97.7 F (36.5 C)-98.2 F (36.8 C)] 97.7 F (36.5 C) (11/14 0810) Pulse Rate:  [50-57] 51 (11/14 0810) Resp:  [18-20] 18 (11/14 0810) BP: (126-141)/(57-100) 141/100 mmHg (11/14 0810) SpO2:  [93 %-100 %] 93 % (11/14 0810) Weight:  [198 lb 12.8 oz (90.175 kg)] 198 lb 12.8 oz (90.175 kg) (11/14 0405) Weight change: 3 lb 7.2 oz (1.565 kg) Last BM Date: 04/30/15 Intake/Output from previous day: +3405 since admit and +500 overnighrt 11/13 0701 - 11/14 0700 In: 700 [P.O.:700] Out: 200 [Urine:200] Intake/Output this shift:    PE: General:Pleasant affect, NAD Skin:Warm and dry, brisk capillary refill HEENT:normocephalic, sclera clear, mucus membranes moist Neck:supple, no JVD, no bruits  Heart:S1S2 RRR without murmur, gallup, rub or click Lungs:clear -ant. without rales, rhonchi, or wheezes [id number], soft, non tender, + BS, do not palpate liver spleen or masses Ext:no lower ext edema, 2+ pedal pulses, 2+ radial pulses Neuro:alert and oriented, MAE, follows commands, + facial symmetry Tele:  s brady to 50 and even 44.    Lab Results: No results for input(s): WBC, HGB, HCT, PLT in the last 72 hours. BMET  Recent Labs  04/30/15 0233 05/01/15 0454  NA 132* 136  K 5.9* 4.0  CL 94* 94*  CO2 21* 26  GLUCOSE 324* 232*  BUN 87* 107*    CREATININE 5.37* 6.49*  CALCIUM 8.7* 7.9*   No results for input(s): TROPONINI in the last 72 hours.  Invalid input(s): CK, MB  Lab Results  Component Value Date   CHOL 118 08/18/2014   HDL 68.30 08/18/2014   LDLCALC 31 08/18/2014   LDLDIRECT 123.0 07/06/2010   TRIG 96.0 08/18/2014   CHOLHDL 2 08/18/2014   Lab Results  Component Value Date   HGBA1C 6.7 04/19/2015     Lab Results  Component Value Date   TSH 1.30 08/18/2014    Hepatic Function Panel No results for input(s): PROT, ALBUMIN, AST, ALT, ALKPHOS, BILITOT, BILIDIR, IBILI in the last 72 hours. No results for input(s): CHOL in the last 72 hours. No results for input(s): PROTIME in the last 72 hours.     Studies/Results: No results found.  Medications: I have reviewed the patient's current medications. Scheduled Meds: . allopurinol  100 mg Oral Daily  . aspirin EC  81 mg Oral Daily  . atorvastatin  40 mg Oral q1800  . calcitRIOL  0.25 mcg Oral Daily  . clopidogrel  75 mg Oral Daily  . enoxaparin (LOVENOX) injection  30 mg Subcutaneous QHS  . ezetimibe  10 mg Oral q morning - 10a  . feeding supplement (GLUCERNA SHAKE)  237 mL Oral q morning - 10a  . furosemide  40 mg Intravenous Q12H  . gabapentin  300 mg Oral QHS  . insulin aspart  0-15 Units Subcutaneous TID WC  . insulin aspart  0-5 Units Subcutaneous  QHS  . insulin aspart  3 Units Subcutaneous TID WC  . insulin detemir  20 Units Subcutaneous BID  . ipratropium-albuterol  3 mL Nebulization TID  . isosorbide mononitrate  60 mg Oral Daily  . latanoprost  1 drop Both Eyes QHS  . [START ON 05/02/2015] levofloxacin (LEVAQUIN) IV  500 mg Intravenous Q48H  . methylPREDNISolone (SOLU-MEDROL) injection  40 mg Intravenous Q12H  . mometasone-formoterol  2 puff Inhalation BID  . pantoprazole  40 mg Oral BID  . ranolazine  500 mg Oral BID  . sertraline  100 mg Oral Daily  . sodium chloride  3 mL Intravenous Q12H   Continuous Infusions:  PRN  Meds:.acetaminophen **OR** acetaminophen, albuterol, guaiFENesin, HYDROcodone-acetaminophen, menthol-cetylpyridinium, nitroGLYCERIN, ondansetron **OR** ondansetron (ZOFRAN) IV   Assessment/Plan: Principal Problem:   Acute respiratory failure with hypoxia (HCC) Active Problems:   DM (diabetes mellitus), type 2, uncontrolled, with renal complications (HCC)   Hyperlipidemia   Essential hypertension   Coronary artery disease due to lipid rich plaque; 70% ostial-proximal AV Groove Cx - not good PCI target   Acute on chronic diastolic heart failure (HCC)   HCAP (healthcare-associated pneumonia)   NSVT (nonsustained ventricular tachycardia) (HCC)   Acute on chronic congestive heart failure (HCC)  Diuretics held but now resumed due to increased Cr. Now at 6.49 Renal has seen. ?ATN from hypoperfusion -approaching dialysis,   BB stopped due to brady, and junctional rhythm.    HTN BP climbing.    LOS: 7 days   Time spent with pt. :15 minutes. [facility]  Nurse Practitioner Certified Pager [phone] or after 5pm and on weekends call [phone] 05/01/2015, 9:33 AM  The patient was seen, examined and discussed with Cecilie Kicks, NP and I agree with the above.   Creatinine continues to increase, however positive fluid fluid balance. Followed by nephrology who is considering HD, but she might not be a candidate. We will restart diuretics Hyperkalemia resolved after receiving Kayexalate and calcium gluconate yesterday, K now 4. She still appears somewhat volume overloaded with LE edema and SOB.  HR improved some after discontinuing b-blocker. Now in the 50's with PAC's at times. Overnight she had intermittent junctional rhythm. Fortunately, there has been no further NSVT.  Dorothy Spark 05/01/2015

## 2015-05-01 NOTE — Progress Notes (Signed)
Speech Language Pathology Treatment: Dysphagia  Patient Details Name: [name] MRN: [id number] DOB: [birth year] Today's Date: 05/01/2015 Time: [id number] SLP Time Calculation (min) (ACUTE ONLY): 19 min  Assessment / Plan / Recommendation Clinical Impression  Pt sitting in bed = ill positioned= for po intake.  SLP called for assistance to reposition pt.  She reports breathing to be better and poor appetite.    Cough x1 noted at baseline - with no po observed.    SLP observed pt consuming pancakes and orange juice.  Pt does tend to take large bites  - suspect this is due to her weakness.  She benefited from moderate verbal/visual cues to follow solids with liquids and clinical reasoning for strategy.  No s/s of aspiration with limited po observed.  Pt reports she has NOT been following solids with liquids, SLP advised her conduct strategy religiously today to determine its benefit.   Pt does admit to premorbid dysphagia (at least to 2005 per chart review -EGD 05) to food more than liquids - with globus sensation and cough/expectoration of food mixed with frothy secretions.  Advised pt to follow compensation strategies for dysphagia mitigation and follow up with her GI MD as OP if note progressive deficits. Also advised pt to eat warm items on tray and save other items for later to prevent "over eating".     Provided spouse with heimlich maneuver in writing and reviewed indication for its use. SLP will follow up one more time to assure tolerance and education completed.     HPI HPI: [name] is an 81 y.o. female patient with history of chronic combined CHF (EF 45-50 percent & grade 3 diastolic dysfunction by [facility] 02/2015), CAD, DM 2, HTN, HLD, OSA on nightly CPAP, PAD status post left BKA, stage III CKD, COPD/? Asthma, bradycardia (hence not on beta blockers), recent hospitalization 9/19-9/22 for unstable angina at which time cath showed stable anatomy, now presented to [facility] ED on  04/24/15 with worsening dyspnea, orthopnea, productive cough, pleuritic appearing chest pain but no fevers. Recently seen by PCP and started on azithromycin for suspected pneumonia & Lasix dose increased. Noncompliant with fluid and water restriction. Admitted for decompensated CHF & question of pneumonia. Per MD notes, on 11/11 patient began to c/o coughing while eating.       SLP Plan  Continue with current plan of care     Recommendations  Diet recommendations: Regular;Thin liquid Liquids provided via: Cup;Straw Medication Administration: Whole meds with puree (crushed if large and not contraindicated) Supervision: Patient able to self feed Compensations: Small sips/bites;Slow rate;Follow solids with liquid Postural Changes and/or Swallow Maneuvers: Seated upright 90 degrees;Upright 30-60 min after meal              Oral Care Recommendations: Oral care BID Plan: Continue with current plan of care  [name], [facility] SLP [phone]

## 2015-05-01 NOTE — Progress Notes (Signed)
[name] Renal Consultation Note  Requesting MD: Feliz-Ortiz Indication for Consultation: A on CRF   HPI:  [name] is a 81 y.o. female with past medical history significant for diabetes mellitus, hypertension, hyperlipidemia, obesity, obstructive sleep apnea with C PAP, COPD, coronary artery disease with also a mild cardiomyopathy EF in the 63s, PAD status post BKA as well as CKD appearing to have a creatinine of between 1.6 and 2.0 and is followed by Dr. Lorrene Reid at [facility]. She's noted to have a hospitalization in September for unstable angina and underwent cardiac catheterization. Looks like a discharge creatinine on September 29 was 1.99. She then presented on 11/ 7 with a major complaint being shortness of breath and a productive cough. Patient was admitted and started on antibiotics for pneumonia as well as lasix. Her initial creatinine was noted to be 2.05, at that improved to 1.88 over the next several days and that was on November 9. After that, patient's creatinine has risen steadily daily up to level of 5.37. Her weights do not indicate a significant diuresis and urine output was not well recorded- she has a foley now. There are no extremely low blood pressures according to the record there are some in the 110's- she has been bradycardic.  Her blood pressure medications have been decreased or stopped.   Urine output is poor.  She had been on been on  Vancomycin but  level was only 15. Urinalysis obtained on 11/ 9 was really unremarkable.   Pt. Continues to feel SOB today, and has had poor urine output overnight. Is oliguric. Her husband says that her BKA has had a significant amount of fluid in it preventing the placement of her prosthetic.   CREATININE, SER  Date/Time Value Ref Range Status  05/01/2015 04:54 AM 6.49* 0.44 - 1.00 mg/dL Final  04/30/2015 02:33 AM 5.37* 0.44 - 1.00 mg/dL Final  04/29/2015 11:02 AM 4.35* 0.44 - 1.00 mg/dL Final   04/28/2015 06:20 AM 3.10* 0.44 - 1.00 mg/dL Final  04/27/2015 03:57 AM 2.44* 0.44 - 1.00 mg/dL Final  04/26/2015 03:15 AM 1.88* 0.44 - 1.00 mg/dL Final  04/25/2015 07:08 AM 1.94* 0.44 - 1.00 mg/dL Final  04/25/2015 12:51 AM 2.07* 0.44 - 1.00 mg/dL Final  04/24/2015 05:38 PM 2.05* 0.44 - 1.00 mg/dL Final  03/16/2015 11:42 AM 1.99* 0.40 - 1.20 mg/dL Final  03/09/2015 04:11 AM 1.94* 0.44 - 1.00 mg/dL Final  03/08/2015 03:22 AM 1.83* 0.44 - 1.00 mg/dL Final  03/07/2015 04:17 AM 1.64* 0.44 - 1.00 mg/dL Final  03/07/2015 12:34 AM 1.75* 0.44 - 1.00 mg/dL Final  03/06/2015 12:02 PM 1.57* 0.44 - 1.00 mg/dL Final  02/17/2015 04:56 PM 1.57* 0.40 - 1.20 mg/dL Final  10/25/2014 11:14 AM 1.54* 0.40 - 1.20 mg/dL Final  07/27/2014 03:49 PM 1.52* 0.50 - 1.10 mg/dL Final  07/01/2014 02:54 PM 1.54* 0.40 - 1.20 mg/dL Final  06/27/2014 03:30 AM 1.90* 0.50 - 1.10 mg/dL Final  06/26/2014 03:27 AM 2.11* 0.50 - 1.10 mg/dL Final  06/25/2014 05:35 AM 2.01* 0.50 - 1.10 mg/dL Final  06/24/2014 04:17 AM 2.13* 0.50 - 1.10 mg/dL Final  06/23/2014 02:44 AM 1.89* 0.50 - 1.10 mg/dL Final  06/22/2014 02:20 PM 1.93* 0.50 - 1.10 mg/dL Final  06/22/2014 05:55 AM 1.73* 0.50 - 1.10 mg/dL Final  06/22/2014 04:09 AM 1.77* 0.50 - 1.10 mg/dL Final  06/21/2014 02:45 AM 1.69* 0.50 - 1.10 mg/dL Final  06/20/2014 03:25 AM 2.07* 0.50 - 1.10 mg/dL Final  06/19/2014  08:45 AM 2.58* 0.50 - 1.10 mg/dL Final  06/18/2014 06:25 AM 2.10* 0.50 - 1.10 mg/dL Final  06/17/2014 07:46 PM 1.39* 0.50 - 1.10 mg/dL Final  06/17/2014 01:29 PM 1.33* 0.50 - 1.10 mg/dL Final  06/08/2014 04:17 PM 1.6* 0.4 - 1.2 mg/dL Final  04/13/2014 11:29 AM 2.4* 0.4 - 1.2 mg/dL Final  04/08/2014 10:53 AM 2.4* 0.4 - 1.2 mg/dL Final  02/17/2014 12:02 PM 1.4* 0.4 - 1.2 mg/dL Final  12/30/2013 11:59 AM 1.4* 0.4 - 1.2 mg/dL Final  12/16/2013 09:26 AM 1.5* 0.4 - 1.2 mg/dL Final  12/08/2013 04:59 AM 1.94* 0.50 - 1.10 mg/dL Final  12/05/2013 06:53 PM 1.50* 0.50 - 1.10  mg/dL Final  11/15/2013 12:05 PM 1.7* 0.4 - 1.2 mg/dL Final  08/05/2013 09:36 AM 1.4* 0.4 - 1.2 mg/dL Final  07/12/2013 04:39 PM 1.5* 0.4 - 1.2 mg/dL Final  04/09/2013 04:04 PM 1.2 0.4 - 1.2 mg/dL Final  01/08/2013 03:25 PM 2.3* 0.4 - 1.2 mg/dL Final  09/08/2012 04:51 AM 1.40* 0.50 - 1.10 mg/dL Final  09/07/2012 07:24 AM 1.47* 0.50 - 1.10 mg/dL Final  09/06/2012 05:47 AM 2.02* 0.50 - 1.10 mg/dL Final  09/05/2012 06:00 AM 2.42* 0.50 - 1.10 mg/dL Final  09/04/2012 07:17 PM 2.74* 0.50 - 1.10 mg/dL Final  09/03/2012 05:30 PM 2.44* 0.50 - 1.10 mg/dL Final     PMHx:   Past Medical History  Diagnosis Date  . Uterine cancer ([place])   . Diabetes mellitus     type II; peripheral neuropathy  . Hypertension   . GERD (gastroesophageal reflux disease)   . Peripheral vascular disease ([place])     a. s/p L BKA 08/2012.  . Obesity   . Dyslipidemia   . Chronic combined systolic and diastolic CHF (congestive heart failure) ([facility])     a. EF 50-55%, mild LVH and grade 1 diast. Dysfxn b. Grade 3 Diastolic Dysfunction 77/4128. b. 02/2015: EF 45-50% by cath.  . Osteoarthritis cervical spine   . DM retinopathy ([place])   . Sleep apnea     with CPAP  . History of shingles   . COPD (chronic obstructive pulmonary disease) ([place])   . Depression   . Peptic ulcer disease     duodenal  . Peptic stricture of esophagus   . Hiatal hernia   . Diverticulosis   . Arthritis   . CAD (coronary artery disease)     a. s/p multiple caths with nonobs CAD;   b. cath 1/10: pLAD 20%, mLAD 40%, pCFX 20%, mCFX 40%, pRCA 60-70%;   c.  Myoview 06/02/12: Low anterior wall scar, no ischemia, EF 37%. d. cath 06/20/2014 70% mid LCx, medical therapy, high risk for PCI due to close proximity to large OM e. cath 03/07/15 pro RCA 50% & pro to mid Cx 75%, both stable, LVEDP 33-82mm Hg-> medical management  . Cardiomyopathy ([place])     a. Echo 05/31/12: EF 40-45%. b. Echo 06/2014: EF 50-55%. c. Cath 02/2015: EF 45-50%.  . Asthma     Mild  .  Pruritic condition     Idiopathic  . Zoster   . Noncompliance   . CKD (chronic kidney disease), stage III     Dr. Lorrene Reid  . Bradycardia     a. Not on BB due to this.  . Shortness of breath dyspnea     Past Surgical History  Procedure Laterality Date  . Tubal ligation  1967  . Knee arthroscopy  10/1998    Left  . Craniotomy  1997    Left for SDH  . Cataract extraction, bilateral  2005  . Hernia repair    . Esophagogastroduodenoscopy  04/04/2004  . Spine surgery      C-spine and lumbar surgery  . Cholecystectomy  2010  . Cardiac catheterization    . Dexa  7/05  . Amputation Left 09/04/2012    Procedure: AMPUTATION BELOW KNEE;  Surgeon: Angelia Mould, MD;  Location: [facility];  Service: Vascular;  Laterality: Left;  . Abdominal angiogram N/A 08/31/2012    Procedure: ABDOMINAL ANGIOGRAM with runoff poss intervention;  Surgeon: Angelia Mould, MD;  Location: [facility] CATH LAB;  Service: Cardiovascular;  Laterality: N/A;  . Tubal ligation    . Left heart catheterization with coronary angiogram N/A 06/22/2014    Procedure: LEFT HEART CATHETERIZATION WITH CORONARY ANGIOGRAM;  Surgeon: Burnell Blanks, MD;  Location: [facility] CATH LAB;  Service: Cardiovascular;  Laterality: N/A;  . Eye surgery    . Brain surgery      1997 blood clot on the brain, then had to relieve fluid on the brain  . Multiple extractions with alveoloplasty N/A 08/01/2014    Procedure: EXTRACTIONS OF TEETH NUMBERS $RemoveBefor'[id number] 11 '[id number]$ AND 19 AND ALVEOLOPLASTY UPPER LEFT AND RIGHT QUADRANT;  Surgeon: Isac Caddy, DDS;  Location: [facility];  Service: Oral Surgery;  Laterality: N/A;  . Cardiac catheterization N/A 03/07/2015    Procedure: Left Heart Cath and Coronary Angiography;  Surgeon: Leonie Man, MD;  Location: [facility] CV LAB;  Service: Cardiovascular;  Laterality: N/A;    Family Hx:  Family History  Problem Relation Age of Onset  . Cancer - Other Mother     "Stomach" Cancer  . Diabetes Mother   .  Heart disease Mother   . Stomach cancer Mother   . Hypertension Mother   . Lymphoma Father   . Hypertension Father   . Kidney disease Paternal Grandmother   . Asthma Other   . Diabetes Sister   . Rheum arthritis Mother   . Rheum arthritis Father   . Heart attack Neg Hx   . Stroke Paternal Grandmother     Social History:  reports that she has never smoked. She has never used smokeless tobacco. She reports that she does not drink alcohol or use illicit drugs.  Allergies:  Allergies  Allergen Reactions  . Iohexol Itching and Rash     Code: RASH, Desc: [place] ON PT'S CHART ALLERGIC TO IV DYE 09/04/07/RM, Onset Date: [id number]     Medications: Prior to Admission medications   Medication Sig Start Date End Date Taking? Authorizing Provider  acetaminophen (TYLENOL) 500 MG tablet Take 500 mg by mouth every 6 (six) hours as needed for mild pain.   Yes Historical Provider, MD  albuterol (PROVENTIL HFA;VENTOLIN HFA) 108 (90 BASE) MCG/ACT inhaler Inhale 2 puffs into the lungs every 4 (four) hours as needed for wheezing or shortness of breath. 04/19/15  Yes Renato Shin, MD  albuterol (PROVENTIL) (2.5 MG/3ML) 0.083% nebulizer solution Take 3 mLs (2.5 mg total) by nebulization every 4 (four) hours as needed for wheezing or shortness of breath. 12/08/13  Yes Robbie Lis, MD  allopurinol (ZYLOPRIM) 100 MG tablet TAKE 1 TABLET BY MOUTH DAILY 07/25/14  Yes Renato Shin, MD  aspirin EC 81 MG tablet Take 1 tablet (81 mg total) by mouth daily. 03/16/15  Yes Dayna N Dunn, PA-C  calcitRIOL (ROCALTROL) 0.25 MCG capsule TAKE ONE CAPSULE BY MOUTH EVERY DAY  04/29/14  Yes Renato Shin, MD  clopidogrel (PLAVIX) 75 MG tablet Take 75 mg by mouth daily.   Yes Historical Provider, MD  donepezil (ARICEPT) 5 MG tablet Take 1 tablet (5 mg total) by mouth at bedtime. 05/19/14  Yes Renato Shin, MD  feeding supplement (GLUCERNA SHAKE) LIQD Take 237 mLs by mouth every morning.    Yes Historical Provider, MD   Fluticasone-Salmeterol (ADVAIR DISKUS) 100-50 MCG/DOSE AEPB Inhale 1 puff into the lungs 2 (two) times daily. 04/19/15  Yes Renato Shin, MD  furosemide (LASIX) 40 MG tablet Take 1 tablet (40 mg total) by mouth daily. Patient taking differently: Take 40 mg by mouth 2 (two) times daily.  04/19/15  Yes Renato Shin, MD  gabapentin (NEURONTIN) 300 MG capsule TAKE ONE CAPSULE BY MOUTH EVERY NIGHT AT BEDTIME 04/10/15  Yes Renato Shin, MD  HYDROcodone-acetaminophen (NORCO) 10-325 MG tablet Take 1 tablet by mouth every 6 (six) hours as needed for moderate pain.   Yes Historical Provider, MD  insulin NPH-regular Human (NOVOLIN 70/30) (70-30) 100 UNIT/ML injection Inject 14 Units into the skin daily with breakfast. Sliding scale. Only takes in the morning 04/19/15  Yes Renato Shin, MD  isosorbide mononitrate (IMDUR) 60 MG 24 hr tablet Take 60 mg by mouth daily. 04/05/15  Yes Historical Provider, MD  latanoprost (XALATAN) 0.005 % ophthalmic solution Place 1 drop into both eyes at bedtime.   Yes Historical Provider, MD  nitroGLYCERIN (NITROSTAT) 0.4 MG SL tablet Place 0.4 mg under the tongue every 5 (five) minutes as needed for chest pain.    Yes Historical Provider, MD  potassium chloride SA (K-DUR,KLOR-CON) 10 MEQ tablet Take 1 tablet (10 mEq total) by mouth daily. 03/09/15  Yes Bhavinkumar Bhagat, PA  ranolazine (RANEXA) 500 MG 12 hr tablet Take 1 tablet (500 mg total) by mouth 2 (two) times daily. 03/09/15  Yes Bhavinkumar Bhagat, PA  ZETIA 10 MG tablet TAKE 1 TABLET BY MOUTH EVERY MORNING 04/17/15  Yes Renato Shin, MD  allopurinol (ZYLOPRIM) 100 MG tablet TAKE 1 TABLET BY MOUTH EVERY DAY 04/26/15   Renato Shin, MD  amLODipine (NORVASC) 10 MG tablet TAKE 1 TABLET BY MOUTH DAILY 04/28/15   Renato Shin, MD  atorvastatin (LIPITOR) 40 MG tablet TAKE 1 TABLET BY MOUTH DAILY AT 6 PM 04/28/15   Renato Shin, MD  azithromycin (ZITHROMAX) 500 MG tablet Take 1 tablet (500 mg total) by mouth daily. Patient not  taking: Reported on 04/24/2015 04/19/15   Renato Shin, MD  hydrALAZINE (APRESOLINE) 25 MG tablet TAKE 1 TABLET BY MOUTH THREE TIMES DAILY 04/28/15   Renato Shin, MD  isosorbide-hydrALAZINE (BIDIL) 20-37.5 MG tablet Take 1 tablet by mouth 3 (three) times daily. 03/14/15   Mihai Croitoru, MD  ONE TOUCH ULTRA TEST test strip USE TO TEST BLOOD SUGAR TWICE DAILY. 11/30/14   Renato Shin, MD  pantoprazole (PROTONIX) 40 MG tablet TAKE 1 TABLET BY MOUTH TWICE DAILY 04/28/15   Renato Shin, MD  sertraline (ZOLOFT) 100 MG tablet TAKE 1 TABLET BY MOUTH EVERY DAY 04/28/15   Renato Shin, MD    I have reviewed the patient's current medications.  Labs:  Results for orders placed or performed during the hospital encounter of 04/24/15 (from the past 48 hour(s))  Glucose, capillary     Status: Abnormal   Collection Time: 04/29/15  4:34 PM  Result Value Ref Range   Glucose-Capillary 480 (H) 65 - 99 mg/dL   Comment 1 Notify RN   Glucose, capillary  Status: Abnormal   Collection Time: 04/29/15  9:25 PM  Result Value Ref Range   Glucose-Capillary 355 (H) 65 - 99 mg/dL   Comment 1 Notify RN    Comment 2 Document in Chart   Basic metabolic panel     Status: Abnormal   Collection Time: 04/30/15  2:33 AM  Result Value Ref Range   Sodium 132 (L) 135 - 145 mmol/L   Potassium 5.9 (H) 3.5 - 5.1 mmol/L   Chloride 94 (L) 101 - 111 mmol/L   CO2 21 (L) 22 - 32 mmol/L   Glucose, Bld 324 (H) 65 - 99 mg/dL   BUN 87 (H) 6 - 20 mg/dL   Creatinine, Ser 5.37 (H) 0.44 - 1.00 mg/dL   Calcium 8.7 (L) 8.9 - 10.3 mg/dL   GFR calc non Af Amer 7 (L) >60 mL/min   GFR calc Af Amer 8 (L) >60 mL/min    Comment: (NOTE) The eGFR has been calculated using the CKD EPI equation. This calculation has not been validated in all clinical situations. eGFR's persistently <60 mL/min signify possible Chronic Kidney Disease.    Anion gap 17 (H) 5 - 15  Glucose, capillary     Status: Abnormal   Collection Time: 04/30/15  6:03 AM  Result  Value Ref Range   Glucose-Capillary 275 (H) 65 - 99 mg/dL   Comment 1 Notify RN    Comment 2 Document in Chart   Glucose, capillary     Status: Abnormal   Collection Time: 04/30/15 11:13 AM  Result Value Ref Range   Glucose-Capillary 320 (H) 65 - 99 mg/dL   Comment 1 Notify RN   Vancomycin, random     Status: None   Collection Time: 04/30/15 11:23 AM  Result Value Ref Range   Vancomycin Rm 15 ug/mL    Comment:        Random Vancomycin therapeutic range is dependent on dosage and time of specimen collection. A peak range is 20.0-40.0 ug/mL A trough range is 5.0-15.0 ug/mL          Sodium, urine, random     Status: None   Collection Time: 04/30/15  3:38 PM  Result Value Ref Range   Sodium, Ur 22 mmol/L  Creatinine, urine, random     Status: None   Collection Time: 04/30/15  3:38 PM  Result Value Ref Range   Creatinine, Urine 114.42 mg/dL  Urinalysis, Routine w reflex microscopic (not at [facility])     Status: Abnormal   Collection Time: 04/30/15  3:38 PM  Result Value Ref Range   Color, Urine RED (A) YELLOW    Comment: BIOCHEMICALS MAY BE AFFECTED BY COLOR   APPearance TURBID (A) CLEAR   Specific Gravity, Urine 1.025 1.005 - 1.030   pH 5.0 5.0 - 8.0   Glucose, UA 100 (A) NEGATIVE mg/dL   Hgb urine dipstick LARGE (A) NEGATIVE   Bilirubin Urine SMALL (A) NEGATIVE   Ketones, ur 15 (A) NEGATIVE mg/dL   Protein, ur 100 (A) NEGATIVE mg/dL   Urobilinogen, UA 1.0 0.0 - 1.0 mg/dL   Nitrite NEGATIVE NEGATIVE   Leukocytes, UA MODERATE (A) NEGATIVE  Urine microscopic-add on     Status: Abnormal   Collection Time: 04/30/15  3:38 PM  Result Value Ref Range   Squamous Epithelial / LPF FEW (A) RARE   WBC, UA TOO NUMEROUS TO COUNT <3 WBC/hpf   RBC / HPF TOO NUMEROUS TO COUNT <3 RBC/hpf   Bacteria, UA MANY (A)  RARE  Glucose, capillary     Status: Abnormal   Collection Time: 04/30/15  4:12 PM  Result Value Ref Range   Glucose-Capillary 244 (H) 65 - 99 mg/dL   Comment 1 Notify RN    Glucose, capillary     Status: Abnormal   Collection Time: 04/30/15  8:52 PM  Result Value Ref Range   Glucose-Capillary 235 (H) 65 - 99 mg/dL  Basic metabolic panel     Status: Abnormal   Collection Time: 05/01/15  4:54 AM  Result Value Ref Range   Sodium 136 135 - 145 mmol/L   Potassium 4.0 3.5 - 5.1 mmol/L    Comment: DELTA CHECK NOTED   Chloride 94 (L) 101 - 111 mmol/L   CO2 26 22 - 32 mmol/L   Glucose, Bld 232 (H) 65 - 99 mg/dL   BUN 107 (H) 6 - 20 mg/dL   Creatinine, Ser 6.49 (H) 0.44 - 1.00 mg/dL   Calcium 7.9 (L) 8.9 - 10.3 mg/dL   GFR calc non Af Amer 5 (L) >60 mL/min   GFR calc Af Amer 6 (L) >60 mL/min    Comment: (NOTE) The eGFR has been calculated using the CKD EPI equation. This calculation has not been validated in all clinical situations. eGFR's persistently <60 mL/min signify possible Chronic Kidney Disease.    Anion gap 16 (H) 5 - 15  Glucose, capillary     Status: Abnormal   Collection Time: 05/01/15  5:42 AM  Result Value Ref Range   Glucose-Capillary 219 (H) 65 - 99 mg/dL   Comment 1 Notify RN    Comment 2 Document in Chart      ROS:  A comprehensive review of systems was negative except for: Respiratory: positive for Shortness of breath  Physical Exam: Filed Vitals:   05/01/15 0810  BP: 141/100  Pulse: 51  Temp: 97.7 F (36.5 C)  Resp: 18     General: Obese, AAOx3, AAF HEENT: MMM, NO LAD, Supple.  Neck: difficult to tell JVD.  Heart: Bradycardia, No MGR.  Lungs: Diffuse crackles bilaterally, appropriate rate, unlabored.  Abdomen: Obese, soft, nontender Extremities: Pitting edema to dependent areas, and L stump.  Skin: Warm and dry Neuro: Alert. Can get off subject.  Assessment/Plan: 81 year old black female with multiple medical issues including CKD.  She is status post a hospitalization in September and comes back again with complaints of shortness of breath attributable to congestive heart failure versus pneumonia. On Appropriate  treatment she's developed acute on chronic renal failure 1.Renal- acute on chronic renal failure since November 9. Cannot identify any specific nephrotoxins. As above, her blood pressure has been in the 110's she has been bradycardic. Possibly  ATN from hypoperfusion? All blood pressure medications have been stopped. Also in the differential would be interstitial nephritis from antibiotics.  Vanc related nephrotoxicity seems unlikely because the level is not high. This is a difficult situation because now her kidney numbers are quite abnormal. She is approaching the possible need for dialysis. I really don't feel like this would be a good idea in this 81 year old with multiple medical issues. I tried to broach the subject with her. She states that a lot of people in her family know a lot about dialysis but then she changed the subject to chemotherapy so I'm not really sure she understood what I was saying. Don't think that she is obstructed as Foley catheter is in place.  We have to hope that if we can continue further for  toxins renal function will start to plateau and improve.  - Cardiorenal syndrome more likely, with FENA of 0.8%. - U/A with gross blood.  - Check renal ultrasound - Will likely need dialysis for volume removal and RRT for now. Hopefully her kidneys will recover.  - Dialysis catheter placement tomorrow.  - Probable dialysis tomorrow.  - Continue lasix. $RemoveBeforeD'40mg'[id number]$  BID.    2. Hypertension/volume  - she seems volume overloaded. BP's elevated consistent with fluid overload. Continue lasix. Will need HD as above.  3. Hyperkalemia- she has been given Kayexalate. On Lasix. K - 4.0 today.  4. Anemia  - hemoglobin around 10. No action at this time   [facility] 05/01/2015, 12:32 PM

## 2015-05-02 ENCOUNTER — Inpatient Hospital Stay (HOSPITAL_COMMUNITY): Payer: Medicare Other

## 2015-05-02 DIAGNOSIS — N179 Acute kidney failure, unspecified: Secondary | ICD-10-CM

## 2015-05-02 LAB — RENAL FUNCTION PANEL
ALBUMIN: 3 g/dL — AB (ref 3.5–5.0)
Anion gap: 18 — ABNORMAL HIGH (ref 5–15)
BUN: 124 mg/dL — AB (ref 6–20)
CALCIUM: 7.9 mg/dL — AB (ref 8.9–10.3)
CO2: 23 mmol/L (ref 22–32)
CREATININE: 7.27 mg/dL — AB (ref 0.44–1.00)
Chloride: 96 mmol/L — ABNORMAL LOW (ref 101–111)
GFR calc Af Amer: 5 mL/min — ABNORMAL LOW (ref >60)
GFR, EST NON AFRICAN AMERICAN: 5 mL/min — AB (ref >60)
GLUCOSE: 211 mg/dL — AB (ref 65–99)
PHOSPHORUS: 8.2 mg/dL — AB (ref 2.5–4.6)
Potassium: 3.7 mmol/L (ref 3.5–5.1)
SODIUM: 137 mmol/L (ref 135–145)

## 2015-05-02 LAB — CBC
HCT: 28.7 % — ABNORMAL LOW (ref 36.0–46.0)
Hemoglobin: 10.1 g/dL — ABNORMAL LOW (ref 12.0–15.0)
MCH: 29.8 pg (ref 26.0–34.0)
MCHC: 35.2 g/dL (ref 30.0–36.0)
MCV: 84.7 fL (ref 78.0–100.0)
PLATELETS: 328 10*3/uL (ref 150–400)
RBC: 3.39 MIL/uL — ABNORMAL LOW (ref 3.87–5.11)
RDW: 18 % — ABNORMAL HIGH (ref 11.5–15.5)
WBC: 6.7 10*3/uL (ref 4.0–10.5)

## 2015-05-02 LAB — PROTIME-INR
INR: 1.52 — ABNORMAL HIGH (ref 0.00–1.49)
PROTHROMBIN TIME: 18.3 s — AB (ref 11.6–15.2)

## 2015-05-02 LAB — GLUCOSE, CAPILLARY
GLUCOSE-CAPILLARY: 209 mg/dL — AB (ref 65–99)
GLUCOSE-CAPILLARY: 184 mg/dL — AB (ref 65–99)
Glucose-Capillary: 263 mg/dL — ABNORMAL HIGH (ref 65–99)
Glucose-Capillary: 157 mg/dL — ABNORMAL HIGH (ref 65–99)

## 2015-05-02 LAB — APTT: aPTT: 32 seconds (ref 24–37)

## 2015-05-02 MED ORDER — LIDOCAINE HCL 1 % IJ SOLN
INTRAMUSCULAR | Status: AC
  Filled 2015-05-02: qty 20

## 2015-05-02 MED ORDER — HEPARIN SODIUM (PORCINE) 1000 UNIT/ML IJ SOLN
INTRAMUSCULAR | Status: AC
  Filled 2015-05-02: qty 1

## 2015-05-02 NOTE — Progress Notes (Signed)
[facility] PROGRESS NOTE    Progress Note   AKELA POCIUS ACZ:[id number] DOB: 09-28-1929 DOA: 04/24/2015 PCP: Renato Shin, MD   Brief Narrative:   [name] is an 81 y.o. female patient with history of chronic combined CHF EF 45-50 percent & grade 3 diastolic dysfunction by [facility] 02/2015), CAD, DM 2, HTN, HLD, OSA on nightly CPAP, PAD status post left BKA, stage III CKD, COPD/? Asthma, bradycardia (hence not on beta blockers), recent hospitalization 9/19-9/22 for unstable angina at which time cath showed stable anatomy, now presented to [facility] ED on 04/24/15 with worsening dyspnea, orthopnea, productive cough, pleuritic appearing chest pain but no fevers. Recently seen by PCP and started on azithromycin for suspected pneumonia & Lasix dose increased. Noncompliant with fluid and water restriction. Admitted for decompensated CHF & Pneumonia. She has new acute renal failure unclear etiology nephrology was consulted HD catheter was placed on 05/02/2015 patient is now some possibly need dialysis. Assessment/Plan:   Acute respiratory failure with hypoxia (HCC) due to   Acute on chronic diastolic heart failure [facility]): Appreciate cardiology's assistance Continue hydralazine and Imdur. Beta blocker was held due to acute renal failure and sinus bradycardia Diuresis was stopped as her creatinine started to rise.  She has had poor urine output and her urine is significantly concentrated. Appreciate cardiology's assistance.  Possible healthcare associated pneumonia: On 04/28/2015 she started wheezing and complaining of cough with eating. Swallowing evaluation done and recommended a dysphagia 3 diet Continue Levaquin until 05/05/2015  Diabetes type 2 with renal complications: Start long-acting insulin plus sliding scale blood glucoses running high.  AKI on Chronic kidney disease stage III: Appreciate renal's assistance. ? Due to  ATN. Her renal function continues to deteriorate,  further management per renal.   Elevated troponins in the setting of Nonobstructive CAD: Likely due to demand ischemia without chest pain, Continue statin aspirin Plavix and Ranexa hydralazine and Imdur. Beta blocker held due to sinus bradycardia.  Hyperlipidemia: Continue statin therapy.  Obstructive sleep apnea: Continues C-pap at night  History of gout: Continue Allopurinol  Sinus bradycardia: Her Aricept and beta blockers were held, cardiology following she continues to be bradycardic.  Hyperkalemia: Resolved with Kayexelate. This likely due to her acute renal failure.    DVT Prophylaxis - Lovenox ordered.  Family Communication: husband Disposition Plan: Home in am Code Status:     Code Status Orders        Start     Ordered   04/24/15 2353  Full code   Continuous     04/24/15 2354        IV Access:    Peripheral IV   Procedures and diagnostic studies:   US Renal  05/01/2015  CLINICAL DATA:  Renal failure. EXAM: RENAL / URINARY TRACT ULTRASOUND COMPLETE COMPARISON:  CT, 12/05/2013 FINDINGS: Right Kidney: Length: 10.6 cm. Echogenicity within normal limits. No mass or hydronephrosis visualized. Left Kidney: Length: 10.3 cm. Echogenicity within normal limits. No mass or hydronephrosis visualized. Bladder: Decompressed with a Foley catheter IMPRESSION: Normal renal ultrasound. Electronically Signed   By: Lajean Manes M.D.   On: 05/01/2015 12:46   Ir Fluoro Guide Cv Line Right  05/02/2015  INDICATION: Acute on chronic renal insufficiency. Please place temporary dialysis catheter for the initiation of dialysis. EXAM: NON-TUNNELED CENTRAL VENOUS HEMODIALYSIS CATHETER PLACEMENT WITH ULTRASOUND AND FLUOROSCOPIC GUIDANCE COMPARISON:  None. MEDICATIONS: None CONTRAST:  None FLUOROSCOPY TIME:  24 seconds (10.9 mGy) COMPLICATIONS: None immediate PROCEDURE: Informed written consent was obtained from the  patient after a discussion of the risks, benefits, and  alternatives to treatment. Questions regarding the procedure were encouraged and answered. The right neck and chest were prepped with chlorhexidine in a sterile fashion, and a sterile drape was applied covering the operative field. Maximum barrier sterile technique with sterile gowns and gloves were used for the procedure. A timeout was performed prior to the initiation of the procedure. After creating a small venotomy incision, a micropuncture kit was utilized to access the right internal jugular vein under direct, real-time ultrasound guidance after the overlying soft tissues were anesthetized with 1% lidocaine with epinephrine. Ultrasound image documentation was performed. The microwire was kinked to measure appropriate catheter length. A stiff glidewire was advanced to the level of the IVC. Under fluoroscopic guidance, the venotomy was serially dilated, ultimately allowing placement of a 20 cm temporary Trialysis catheter with tip ultimately terminating within the superior aspect of the right atrium. Final catheter positioning was confirmed and documented with a spot radiographic image. The catheter aspirates and flushes normally. The catheter was flushed with appropriate volume heparin dwells. The catheter exit site was secured with a 0-Prolene retention suture. A dressing was placed. The patient tolerated the procedure well without immediate post procedural complication. IMPRESSION: Successful placement of a right internal jugular approach 20 cm temporary dialysis catheter with tip terminating with in the superior aspect of the right atrium. This catheter may be converted to a tunneled dialysis catheter at a later date as indicated. The catheter is ready for immediate use. Electronically Signed   By: Sandi Mariscal M.D.   On: 05/02/2015 09:53   Ir US Guide Vasc Access Right  05/02/2015  INDICATION: Acute on chronic renal insufficiency. Please place temporary dialysis catheter for the initiation of dialysis.  EXAM: NON-TUNNELED CENTRAL VENOUS HEMODIALYSIS CATHETER PLACEMENT WITH ULTRASOUND AND FLUOROSCOPIC GUIDANCE COMPARISON:  None. MEDICATIONS: None CONTRAST:  None FLUOROSCOPY TIME:  24 seconds (17.6 mGy) COMPLICATIONS: None immediate PROCEDURE: Informed written consent was obtained from the patient after a discussion of the risks, benefits, and alternatives to treatment. Questions regarding the procedure were encouraged and answered. The right neck and chest were prepped with chlorhexidine in a sterile fashion, and a sterile drape was applied covering the operative field. Maximum barrier sterile technique with sterile gowns and gloves were used for the procedure. A timeout was performed prior to the initiation of the procedure. After creating a small venotomy incision, a micropuncture kit was utilized to access the right internal jugular vein under direct, real-time ultrasound guidance after the overlying soft tissues were anesthetized with 1% lidocaine with epinephrine. Ultrasound image documentation was performed. The microwire was kinked to measure appropriate catheter length. A stiff glidewire was advanced to the level of the IVC. Under fluoroscopic guidance, the venotomy was serially dilated, ultimately allowing placement of a 20 cm temporary Trialysis catheter with tip ultimately terminating within the superior aspect of the right atrium. Final catheter positioning was confirmed and documented with a spot radiographic image. The catheter aspirates and flushes normally. The catheter was flushed with appropriate volume heparin dwells. The catheter exit site was secured with a 0-Prolene retention suture. A dressing was placed. The patient tolerated the procedure well without immediate post procedural complication. IMPRESSION: Successful placement of a right internal jugular approach 20 cm temporary dialysis catheter with tip terminating with in the superior aspect of the right atrium. This catheter may be converted  to a tunneled dialysis catheter at a later date as indicated. The catheter is ready for  immediate use. Electronically Signed   By: Sandi Mariscal M.D.   On: 05/02/2015 09:53     Medical Consultants:    None.  Anti-Infectives:   Anti-infectives    Start     Dose/Rate Route Frequency Ordered Stop   05/02/15 1000  levofloxacin (LEVAQUIN) IVPB 500 mg     500 mg 100 mL/hr over 60 Minutes Intravenous Every 48 hours 04/30/15 0940     04/30/15 1200  vancomycin (VANCOCIN) IVPB 1000 mg/200 mL premix  Status:  Discontinued     1,000 mg 200 mL/hr over 60 Minutes Intravenous Every 48 hours 04/28/15 1254 04/30/15 0918   04/30/15 1100  levofloxacin (LEVAQUIN) IVPB 500 mg  Status:  Discontinued     500 mg 100 mL/hr over 60 Minutes Intravenous Every 48 hours 04/28/15 1052 04/29/15 0956   04/30/15 1000  levofloxacin (LEVAQUIN) IVPB 750 mg     750 mg 100 mL/hr over 90 Minutes Intravenous  Once 04/30/15 0944 04/30/15 1206   04/28/15 1100  levofloxacin (LEVAQUIN) IVPB 750 mg     750 mg 100 mL/hr over 90 Minutes Intravenous  Once 04/28/15 1034 04/28/15 1830   04/28/15 1100  cefTAZidime (FORTAZ) 2 g in dextrose 5 % 50 mL IVPB  Status:  Discontinued     2 g 100 mL/hr over 30 Minutes Intravenous Every 24 hours 04/28/15 1037 04/30/15 0940   04/28/15 1100  vancomycin (VANCOCIN) IVPB 1000 mg/200 mL premix     1,000 mg 200 mL/hr over 60 Minutes Intravenous  Once 04/28/15 1048 04/28/15 1408   04/25/15 2100  vancomycin (VANCOCIN) IVPB 1000 mg/200 mL premix  Status:  Discontinued     1,000 mg 200 mL/hr over 60 Minutes Intravenous Every 24 hours 04/24/15 2032 04/26/15 1302   04/25/15 2030  cefTAZidime (FORTAZ) 2 g in dextrose 5 % 50 mL IVPB  Status:  Discontinued     2 g 100 mL/hr over 30 Minutes Intravenous Every 24 hours 04/24/15 2032 04/26/15 1302   04/25/15 0000  cefTAZidime (FORTAZ) 2 g in dextrose 5 % 50 mL IVPB  Status:  Discontinued     2 g 100 mL/hr over 30 Minutes Intravenous 3 times per day 04/24/15  2354 04/25/15 0018   04/24/15 2030  cefTAZidime (FORTAZ) 2 g in dextrose 5 % 50 mL IVPB  Status:  Discontinued     2 g 100 mL/hr over 30 Minutes Intravenous  Once 04/24/15 2025 04/24/15 2032   04/24/15 2030  vancomycin (VANCOCIN) IVPB 1000 mg/200 mL premix  Status:  Discontinued     1,000 mg 200 mL/hr over 60 Minutes Intravenous  Once 04/24/15 2025 04/24/15 2032      Subjective:    [place] no complains.  Objective:    Filed Vitals:   05/01/15 2115 05/01/15 2117 05/02/15 0623 05/02/15 1008  BP: 110/58  133/47 135/56  Pulse: 88 57 55 50  Temp: 97.8 F (36.6 C)  97.8 F (36.6 C) 98.3 F (36.8 C)  TempSrc: Oral  Oral Oral  Resp: $Remo'20 18 19 18  'Nxaxz$ Height:      Weight:   90.131 kg (198 lb 11.2 oz)   SpO2: 95% 95% 95% 93%    Intake/Output Summary (Last 24 hours) at 05/02/15 1257 Last data filed at 05/02/15 1055  Gross per 24 hour  Intake    120 ml  Output    525 ml  Net   -405 ml   Filed Weights   04/30/15 0537 05/01/15 0405 05/02/15 2458  Weight: 88.61 kg (195 lb 5.6 oz) 90.175 kg (198 lb 12.8 oz) 90.131 kg (198 lb 11.2 oz)    Exam: Gen:  NAD, somnolent. Cardiovascular:  RRR, No M/R/G Chest and lungs:   Good air movement with,crackles on the right. Abdomen:  Abdomen soft, NT/ND, + BS Extremities:  No lower extremity edema, Left BKA.   Data Reviewed:    Labs: Basic Metabolic Panel:  Recent Labs Lab 04/28/15 0620 04/29/15 1102 04/30/15 0233 05/01/15 0454 05/02/15 0836  NA 137 134* 132* 136 137  K 4.2 5.2* 5.9* 4.0 3.7  CL 97* 95* 94* 94* 96*  CO2 28 24 21* 26 23  GLUCOSE 206* 434* 324* 232* 211*  BUN 45* 71* 87* 107* 124*  CREATININE 3.10* 4.35* 5.37* 6.49* 7.27*  CALCIUM 8.6* 8.5* 8.7* 7.9* 7.9*  PHOS  --   --   --   --  8.2*   GFR Estimated Creatinine Clearance: 6.6 mL/min (by C-G formula based on Cr of 7.27). Liver Function Tests:  Recent Labs Lab 05/02/15 0836  ALBUMIN 3.0*   No results for input(s): LIPASE, AMYLASE in the last  168 hours. No results for input(s): AMMONIA in the last 168 hours. Coagulation profile  Recent Labs Lab 05/02/15 0239  INR 1.52*    CBC:  Recent Labs Lab 04/28/15 0620 05/02/15 0239  WBC 10.5 6.7  HGB 9.8* 10.1*  HCT 27.8* 28.7*  MCV 89.4 84.7  PLT 333 328   Cardiac Enzymes: No results for input(s): CKTOTAL, CKMB, CKMBINDEX, TROPONINI in the last 168 hours. BNP (last 3 results)  Recent Labs  06/08/14 1617  PROBNP 1249.0*   CBG:  Recent Labs Lab 05/01/15 1353 05/01/15 1640 05/01/15 2218 05/02/15 0620 05/02/15 1135  GLUCAP 360* 371* 218* 209* 184*   D-Dimer: No results for input(s): DDIMER in the last 72 hours. Hgb A1c: No results for input(s): HGBA1C in the last 72 hours. Lipid Profile: No results for input(s): CHOL, HDL, LDLCALC, TRIG, CHOLHDL, LDLDIRECT in the last 72 hours. Thyroid function studies: No results for input(s): TSH, T4TOTAL, T3FREE, THYROIDAB in the last 72 hours.  Invalid input(s): FREET3 Anemia work up: No results for input(s): VITAMINB12, FOLATE, FERRITIN, TIBC, IRON, RETICCTPCT in the last 72 hours. Sepsis Labs:  Recent Labs Lab 04/28/15 0620 05/02/15 0239  WBC 10.5 6.7   Microbiology Recent Results (from the past 240 hour(s))  Blood culture (routine x 2)     Status: None   Collection Time: 04/24/15  8:54 PM  Result Value Ref Range Status   Specimen Description BLOOD LEFT ANTECUBITAL  Final   Special Requests BOTTLES DRAWN AEROBIC AND ANAEROBIC 5CC   Final   Culture NO GROWTH 5 DAYS  Final   Report Status 04/29/2015 FINAL  Final  Blood culture (routine x 2)     Status: None   Collection Time: 04/24/15  8:59 PM  Result Value Ref Range Status   Specimen Description BLOOD RIGHT ANTECUBITAL  Final   Special Requests BOTTLES DRAWN AEROBIC AND ANAEROBIC 5CC   Final   Culture NO GROWTH 5 DAYS  Final   Report Status 04/29/2015 FINAL  Final  MRSA PCR Screening     Status: None   Collection Time: 04/24/15 10:51 PM  Result Value  Ref Range Status   MRSA by PCR NEGATIVE NEGATIVE Final    Comment:        The GeneXpert MRSA Assay (FDA approved for NASAL specimens only), is one component of a comprehensive MRSA colonization surveillance  program. It is not intended to diagnose MRSA infection nor to guide or monitor treatment for MRSA infections.   Urine culture     Status: None   Collection Time: 04/30/15  3:38 PM  Result Value Ref Range Status   Specimen Description URINE, CATHETERIZED  Final   Special Requests NONE  Final   Culture NO GROWTH 1 DAY  Final   Report Status 05/01/2015 FINAL  Final     Medications:   . allopurinol  100 mg Oral Daily  . aspirin EC  81 mg Oral Daily  . atorvastatin  40 mg Oral q1800  . calcitRIOL  0.25 mcg Oral Daily  . enoxaparin (LOVENOX) injection  30 mg Subcutaneous QHS  . ezetimibe  10 mg Oral q morning - 10a  . feeding supplement (GLUCERNA SHAKE)  237 mL Oral q morning - 10a  . furosemide  40 mg Intravenous Q12H  . gabapentin  300 mg Oral QHS  . heparin      . insulin aspart  0-15 Units Subcutaneous TID WC  . insulin aspart  0-5 Units Subcutaneous QHS  . insulin aspart  3 Units Subcutaneous TID WC  . insulin detemir  20 Units Subcutaneous BID  . ipratropium-albuterol  3 mL Nebulization TID  . isosorbide mononitrate  60 mg Oral Daily  . latanoprost  1 drop Both Eyes QHS  . levofloxacin (LEVAQUIN) IV  500 mg Intravenous Q48H  . lidocaine      . methylPREDNISolone (SOLU-MEDROL) injection  40 mg Intravenous Q12H  . mometasone-formoterol  2 puff Inhalation BID  . pantoprazole  40 mg Oral BID  . ranolazine  500 mg Oral BID  . sertraline  100 mg Oral Daily  . sodium chloride  3 mL Intravenous Q12H   Continuous Infusions:   Time spent: 25 min   LOS: 8 days   Charlynne Cousins  [facility] Pager [phone]  *Please refer to [email or web address], password [email or web address] to get updated schedule on who will round on this patient, as hospitalists switch teams weekly. If  7PM-7AM, please contact night-coverage at [email or web address], password [email or web address] for any overnight needs.  05/02/2015, 12:57 PM

## 2015-05-02 NOTE — Progress Notes (Signed)
PT Cancellation Note  Patient Details Name: [name] MRN: [id number] DOB: 1929/12/14   Cancelled Treatment:    Reason Eval/Treat Not Completed: Patient at procedure or test/unavailable. Pt off floor at procedure. PT to return as able.   Kingsley Callander 05/02/2015, 8:41 AM   Kittie Plater, PT, DPT Pager #: [phone] Office #: [phone]

## 2015-05-02 NOTE — Progress Notes (Signed)
[facility] Renal Consultation Note  Requesting MD: Feliz-Ortiz Indication for Consultation: A on CRF   HPI:  [name] is a 81 y.o. female with past medical history significant for diabetes mellitus, hypertension, hyperlipidemia, obesity, obstructive sleep apnea with C PAP, COPD, coronary artery disease with also a mild cardiomyopathy EF in the 40s, PAD status post BKA as well as CKD appearing to have a creatinine of between 1.6 and 2.0 and is followed by Dr. Lorrene Reid at [facility]. She's noted to have a hospitalization in September for unstable angina and underwent cardiac catheterization. Looks like a discharge creatinine on September 29 was 1.99. She then presented on 11/ 7 with a major complaint being shortness of breath and a productive cough. Patient was admitted and started on antibiotics for pneumonia as well as lasix. Her initial creatinine was noted to be 2.05, at that improved to 1.88 over the next several days and that was on November 9. After that, patient's creatinine has risen steadily daily up to level of 5.37. Her weights do not indicate a significant diuresis and urine output was not well recorded- she has a foley now. There are no extremely low blood pressures according to the record there are some in the 110's- she has been bradycardic.  Her blood pressure medications have been decreased or stopped.   Urine output is poor.  She had been on been on  Vancomycin but  level was only 15. Urinalysis obtained on 11/ 9 was really unremarkable.   Pt. With continued SOB. Poor urine output continues. Planning for HD cath today and likely dialysis to help with volume removal.   CREATININE, SER  Date/Time Value Ref Range Status  05/01/2015 04:54 AM 6.49* 0.44 - 1.00 mg/dL Final  04/30/2015 02:33 AM 5.37* 0.44 - 1.00 mg/dL Final  04/29/2015 11:02 AM 4.35* 0.44 - 1.00 mg/dL Final  04/28/2015 06:20 AM 3.10* 0.44 - 1.00 mg/dL Final  04/27/2015 03:57 AM  2.44* 0.44 - 1.00 mg/dL Final  04/26/2015 03:15 AM 1.88* 0.44 - 1.00 mg/dL Final  04/25/2015 07:08 AM 1.94* 0.44 - 1.00 mg/dL Final  04/25/2015 12:51 AM 2.07* 0.44 - 1.00 mg/dL Final  04/24/2015 05:38 PM 2.05* 0.44 - 1.00 mg/dL Final  03/16/2015 11:42 AM 1.99* 0.40 - 1.20 mg/dL Final  03/09/2015 04:11 AM 1.94* 0.44 - 1.00 mg/dL Final  03/08/2015 03:22 AM 1.83* 0.44 - 1.00 mg/dL Final  03/07/2015 04:17 AM 1.64* 0.44 - 1.00 mg/dL Final  03/07/2015 12:34 AM 1.75* 0.44 - 1.00 mg/dL Final  03/06/2015 12:02 PM 1.57* 0.44 - 1.00 mg/dL Final  02/17/2015 04:56 PM 1.57* 0.40 - 1.20 mg/dL Final  10/25/2014 11:14 AM 1.54* 0.40 - 1.20 mg/dL Final  07/27/2014 03:49 PM 1.52* 0.50 - 1.10 mg/dL Final  07/01/2014 02:54 PM 1.54* 0.40 - 1.20 mg/dL Final  06/27/2014 03:30 AM 1.90* 0.50 - 1.10 mg/dL Final  06/26/2014 03:27 AM 2.11* 0.50 - 1.10 mg/dL Final  06/25/2014 05:35 AM 2.01* 0.50 - 1.10 mg/dL Final  06/24/2014 04:17 AM 2.13* 0.50 - 1.10 mg/dL Final  06/23/2014 02:44 AM 1.89* 0.50 - 1.10 mg/dL Final  06/22/2014 02:20 PM 1.93* 0.50 - 1.10 mg/dL Final  06/22/2014 05:55 AM 1.73* 0.50 - 1.10 mg/dL Final  06/22/2014 04:09 AM 1.77* 0.50 - 1.10 mg/dL Final  06/21/2014 02:45 AM 1.69* 0.50 - 1.10 mg/dL Final  06/20/2014 03:25 AM 2.07* 0.50 - 1.10 mg/dL Final  06/19/2014 08:45 AM 2.58* 0.50 - 1.10 mg/dL Final  06/18/2014 06:25 AM 2.10* 0.50 -  1.10 mg/dL Final  06/17/2014 07:46 PM 1.39* 0.50 - 1.10 mg/dL Final  06/17/2014 01:29 PM 1.33* 0.50 - 1.10 mg/dL Final  06/08/2014 04:17 PM 1.6* 0.4 - 1.2 mg/dL Final  04/13/2014 11:29 AM 2.4* 0.4 - 1.2 mg/dL Final  04/08/2014 10:53 AM 2.4* 0.4 - 1.2 mg/dL Final  02/17/2014 12:02 PM 1.4* 0.4 - 1.2 mg/dL Final  12/30/2013 11:59 AM 1.4* 0.4 - 1.2 mg/dL Final  12/16/2013 09:26 AM 1.5* 0.4 - 1.2 mg/dL Final  12/08/2013 04:59 AM 1.94* 0.50 - 1.10 mg/dL Final  12/05/2013 06:53 PM 1.50* 0.50 - 1.10 mg/dL Final  11/15/2013 12:05 PM 1.7* 0.4 - 1.2 mg/dL Final  08/05/2013  09:36 AM 1.4* 0.4 - 1.2 mg/dL Final  07/12/2013 04:39 PM 1.5* 0.4 - 1.2 mg/dL Final  04/09/2013 04:04 PM 1.2 0.4 - 1.2 mg/dL Final  01/08/2013 03:25 PM 2.3* 0.4 - 1.2 mg/dL Final  09/08/2012 04:51 AM 1.40* 0.50 - 1.10 mg/dL Final  09/07/2012 07:24 AM 1.47* 0.50 - 1.10 mg/dL Final  09/06/2012 05:47 AM 2.02* 0.50 - 1.10 mg/dL Final  09/05/2012 06:00 AM 2.42* 0.50 - 1.10 mg/dL Final  09/04/2012 07:17 PM 2.74* 0.50 - 1.10 mg/dL Final  09/03/2012 05:30 PM 2.44* 0.50 - 1.10 mg/dL Final     PMHx:   Past Medical History  Diagnosis Date  . Uterine cancer ([place])   . Diabetes mellitus     type II; peripheral neuropathy  . Hypertension   . GERD (gastroesophageal reflux disease)   . Peripheral vascular disease ([place])     a. s/p L BKA 08/2012.  . Obesity   . Dyslipidemia   . Chronic combined systolic and diastolic CHF (congestive heart failure) (H[facility])     a. EF 50-55%, mild LVH and grade 1 diast. Dysfxn b. Grade 3 Diastolic Dysfunction 01/6577. b. 02/2015: EF 45-50% by cath.  . Osteoarthritis cervical spine   . DM retinopathy ([place])   . Sleep apnea     with CPAP  . History of shingles   . COPD (chronic obstructive pulmonary disease) ([place])   . Depression   . Peptic ulcer disease     duodenal  . Peptic stricture of esophagus   . Hiatal hernia   . Diverticulosis   . Arthritis   . CAD (coronary artery disease)     a. s/p multiple caths with nonobs CAD;   b. cath 1/10: pLAD 20%, mLAD 40%, pCFX 20%, mCFX 40%, pRCA 60-70%;   c.  Myoview 06/02/12: Low anterior wall scar, no ischemia, EF 37%. d. cath 06/20/2014 70% mid LCx, medical therapy, high risk for PCI due to close proximity to large OM e. cath 03/07/15 pro RCA 50% & pro to mid Cx 75%, both stable, LVEDP 33-70m Hg-> medical management  . Cardiomyopathy ([place]     a. Echo 05/31/12: EF 40-45%. b. Echo 06/2014: EF 50-55%. c. Cath 02/2015: EF 45-50%.  . Asthma     Mild  . Pruritic condition     Idiopathic  . Zoster   . Noncompliance   . CKD  (chronic kidney disease), stage III     Dr. DLorrene Reid . Bradycardia     a. Not on BB due to this.  . Shortness of breath dyspnea     Past Surgical History  Procedure Laterality Date  . Tubal ligation  1967  . Knee arthroscopy  10/1998    Left  . Craniotomy  1997    Left for SDH  . Cataract extraction, bilateral  2005  .  Hernia repair    . Esophagogastroduodenoscopy  04/04/2004  . Spine surgery      C-spine and lumbar surgery  . Cholecystectomy  2010  . Cardiac catheterization    . Dexa  7/05  . Amputation Left 09/04/2012    Procedure: AMPUTATION BELOW KNEE;  Surgeon: Angelia Mould, MD;  Location: [place];  Service: Vascular;  Laterality: Left;  . Abdominal angiogram N/A 08/31/2012    Procedure: ABDOMINAL ANGIOGRAM with runoff poss intervention;  Surgeon: Angelia Mould, MD;  Location: [facility] CATH LAB;  Service: Cardiovascular;  Laterality: N/A;  . Tubal ligation    . Left heart catheterization with coronary angiogram N/A 06/22/2014    Procedure: LEFT HEART CATHETERIZATION WITH CORONARY ANGIOGRAM;  Surgeon: Burnell Blanks, MD;  Location: [facility] CATH LAB;  Service: Cardiovascular;  Laterality: N/A;  . Eye surgery    . Brain surgery      1997 blood clot on the brain, then had to relieve fluid on the brain  . Multiple extractions with alveoloplasty N/A 08/01/2014    Procedure: EXTRACTIONS OF TEETH NUMBERS _0 AND 19 AND ALVEOLOPLASTY UPPER LEFT AND RIGHT QUADRANT;  Surgeon: Isac Caddy, DDS;  Location: [place];  Service: Oral Surgery;  Laterality: N/A;  . Cardiac catheterization N/A 03/07/2015    Procedure: Left Heart Cath and Coronary Angiography;  Surgeon: Leonie Man, MD;  Location: [place] CV LAB;  Service: Cardiovascular;  Laterality: N/A;    Family Hx:  Family History  Problem Relation Age of Onset  . Cancer - Other Mother     "Stomach" Cancer  . Diabetes Mother   . Heart disease Mother   . Stomach cancer Mother   . Hypertension Mother   .  Lymphoma Father   . Hypertension Father   . Kidney disease Paternal Grandmother   . Asthma Other   . Diabetes Sister   . Rheum arthritis Mother   . Rheum arthritis Father   . Heart attack Neg Hx   . Stroke Paternal Grandmother     Social History:  reports that she has never smoked. She has never used smokeless tobacco. She reports that she does not drink alcohol or use illicit drugs.  Allergies:  Allergies  Allergen Reactions  . Iohexol Itching and Rash     Code: RASH, Desc: [place] ON PT'S CHART ALLERGIC TO IV DYE 09/04/07/RM, Onset Date: [id number]     Medications: Prior to Admission medications   Medication Sig Start Date End Date Taking? Authorizing Provider  acetaminophen (TYLENOL) 500 MG tablet Take 500 mg by mouth every 6 (six) hours as needed for mild pain.   Yes Historical Provider, MD  albuterol (PROVENTIL HFA;VENTOLIN HFA) 108 (90 BASE) MCG/ACT inhaler Inhale 2 puffs into the lungs every 4 (four) hours as needed for wheezing or shortness of breath. 04/19/15  Yes Renato Shin, MD  albuterol (PROVENTIL) (2.5 MG/3ML) 0.083% nebulizer solution Take 3 mLs (2.5 mg total) by nebulization every 4 (four) hours as needed for wheezing or shortness of breath. 12/08/13  Yes Robbie Lis, MD  allopurinol (ZYLOPRIM) 100 MG tablet TAKE 1 TABLET BY MOUTH DAILY 07/25/14  Yes Renato Shin, MD  aspirin EC 81 MG tablet Take 1 tablet (81 mg total) by mouth daily. 03/16/15  Yes Dayna N Dunn, PA-C  calcitRIOL (ROCALTROL) 0.25 MCG capsule TAKE ONE CAPSULE BY MOUTH EVERY DAY 04/29/14  Yes Renato Shin, MD  clopidogrel (PLAVIX) 75 MG tablet Take 75 mg by  mouth daily.   Yes Historical Provider, MD  donepezil (ARICEPT) 5 MG tablet Take 1 tablet (5 mg total) by mouth at bedtime. 05/19/14  Yes Renato Shin, MD  feeding supplement (GLUCERNA SHAKE) LIQD Take 237 mLs by mouth every morning.    Yes Historical Provider, MD  Fluticasone-Salmeterol (ADVAIR DISKUS) 100-50 MCG/DOSE AEPB Inhale 1 puff into the  lungs 2 (two) times daily. 04/19/15  Yes Renato Shin, MD  furosemide (LASIX) 40 MG tablet Take 1 tablet (40 mg total) by mouth daily. Patient taking differently: Take 40 mg by mouth 2 (two) times daily.  04/19/15  Yes Renato Shin, MD  gabapentin (NEURONTIN) 300 MG capsule TAKE ONE CAPSULE BY MOUTH EVERY NIGHT AT BEDTIME 04/10/15  Yes Renato Shin, MD  HYDROcodone-acetaminophen (NORCO) 10-325 MG tablet Take 1 tablet by mouth every 6 (six) hours as needed for moderate pain.   Yes Historical Provider, MD  insulin NPH-regular Human (NOVOLIN 70/30) (70-30) 100 UNIT/ML injection Inject 14 Units into the skin daily with breakfast. Sliding scale. Only takes in the morning 04/19/15  Yes Renato Shin, MD  isosorbide mononitrate (IMDUR) 60 MG 24 hr tablet Take 60 mg by mouth daily. 04/05/15  Yes Historical Provider, MD  latanoprost (XALATAN) 0.005 % ophthalmic solution Place 1 drop into both eyes at bedtime.   Yes Historical Provider, MD  nitroGLYCERIN (NITROSTAT) 0.4 MG SL tablet Place 0.4 mg under the tongue every 5 (five) minutes as needed for chest pain.    Yes Historical Provider, MD  potassium chloride SA (K-DUR,KLOR-CON) 10 MEQ tablet Take 1 tablet (10 mEq total) by mouth daily. 03/09/15  Yes Bhavinkumar Bhagat, PA  ranolazine (RANEXA) 500 MG 12 hr tablet Take 1 tablet (500 mg total) by mouth 2 (two) times daily. 03/09/15  Yes Bhavinkumar Bhagat, PA  ZETIA 10 MG tablet TAKE 1 TABLET BY MOUTH EVERY MORNING 04/17/15  Yes Renato Shin, MD  allopurinol (ZYLOPRIM) 100 MG tablet TAKE 1 TABLET BY MOUTH EVERY DAY 04/26/15   Renato Shin, MD  amLODipine (NORVASC) 10 MG tablet TAKE 1 TABLET BY MOUTH DAILY 04/28/15   Renato Shin, MD  atorvastatin (LIPITOR) 40 MG tablet TAKE 1 TABLET BY MOUTH DAILY AT 6 PM 04/28/15   Renato Shin, MD  azithromycin (ZITHROMAX) 500 MG tablet Take 1 tablet (500 mg total) by mouth daily. Patient not taking: Reported on 04/24/2015 04/19/15   Renato Shin, MD  hydrALAZINE (APRESOLINE) 25 MG  tablet TAKE 1 TABLET BY MOUTH THREE TIMES DAILY 04/28/15   Renato Shin, MD  isosorbide-hydrALAZINE (BIDIL) 20-37.5 MG tablet Take 1 tablet by mouth 3 (three) times daily. 03/14/15   Mihai Croitoru, MD  ONE TOUCH ULTRA TEST test strip USE TO TEST BLOOD SUGAR TWICE DAILY. 11/30/14   Renato Shin, MD  pantoprazole (PROTONIX) 40 MG tablet TAKE 1 TABLET BY MOUTH TWICE DAILY 04/28/15   Renato Shin, MD  sertraline (ZOLOFT) 100 MG tablet TAKE 1 TABLET BY MOUTH EVERY DAY 04/28/15   Renato Shin, MD    I have reviewed the patient's current medications.  Labs:  Results for orders placed or performed during the hospital encounter of 04/24/15 (from the past 48 hour(s))  Glucose, capillary     Status: Abnormal   Collection Time: 04/30/15 11:13 AM  Result Value Ref Range   Glucose-Capillary 320 (H) 65 - 99 mg/dL   Comment 1 Notify RN   Vancomycin, random     Status: None   Collection Time: 04/30/15 11:23 AM  Result Value Ref Range  Vancomycin Rm 15 ug/mL    Comment:        Random Vancomycin therapeutic range is dependent on dosage and time of specimen collection. A peak range is 20.0-40.0 ug/mL A trough range is 5.0-15.0 ug/mL          Sodium, urine, random     Status: None   Collection Time: 04/30/15  3:38 PM  Result Value Ref Range   Sodium, Ur 22 mmol/L  Creatinine, urine, random     Status: None   Collection Time: 04/30/15  3:38 PM  Result Value Ref Range   Creatinine, Urine 114.42 mg/dL  Urine culture     Status: None   Collection Time: 04/30/15  3:38 PM  Result Value Ref Range   Specimen Description URINE, CATHETERIZED    Special Requests NONE    Culture NO GROWTH 1 DAY    Report Status 05/01/2015 FINAL   Urinalysis, Routine w reflex microscopic (not at [facility])     Status: Abnormal   Collection Time: 04/30/15  3:38 PM  Result Value Ref Range   Color, Urine RED (A) YELLOW    Comment: BIOCHEMICALS MAY BE AFFECTED BY COLOR   APPearance TURBID (A) CLEAR   Specific Gravity, Urine  1.025 1.005 - 1.030   pH 5.0 5.0 - 8.0   Glucose, UA 100 (A) NEGATIVE mg/dL   Hgb urine dipstick LARGE (A) NEGATIVE   Bilirubin Urine SMALL (A) NEGATIVE   Ketones, ur 15 (A) NEGATIVE mg/dL   Protein, ur 100 (A) NEGATIVE mg/dL   Urobilinogen, UA 1.0 0.0 - 1.0 mg/dL   Nitrite NEGATIVE NEGATIVE   Leukocytes, UA MODERATE (A) NEGATIVE  Urine microscopic-add on     Status: Abnormal   Collection Time: 04/30/15  3:38 PM  Result Value Ref Range   Squamous Epithelial / LPF FEW (A) RARE   WBC, UA TOO NUMEROUS TO COUNT <3 WBC/hpf   RBC / HPF TOO NUMEROUS TO COUNT <3 RBC/hpf   Bacteria, UA MANY (A) RARE  Glucose, capillary     Status: Abnormal   Collection Time: 04/30/15  4:12 PM  Result Value Ref Range   Glucose-Capillary 244 (H) 65 - 99 mg/dL   Comment 1 Notify RN   Glucose, capillary     Status: Abnormal   Collection Time: 04/30/15  8:52 PM  Result Value Ref Range   Glucose-Capillary 235 (H) 65 - 99 mg/dL  Basic metabolic panel     Status: Abnormal   Collection Time: 05/01/15  4:54 AM  Result Value Ref Range   Sodium 136 135 - 145 mmol/L   Potassium 4.0 3.5 - 5.1 mmol/L    Comment: DELTA CHECK NOTED   Chloride 94 (L) 101 - 111 mmol/L   CO2 26 22 - 32 mmol/L   Glucose, Bld 232 (H) 65 - 99 mg/dL   BUN 107 (H) 6 - 20 mg/dL   Creatinine, Ser 6.49 (H) 0.44 - 1.00 mg/dL   Calcium 7.9 (L) 8.9 - 10.3 mg/dL   GFR calc non Af Amer 5 (L) >60 mL/min   GFR calc Af Amer 6 (L) >60 mL/min    Comment: (NOTE) The eGFR has been calculated using the CKD EPI equation. This calculation has not been validated in all clinical situations. eGFR's persistently <60 mL/min signify possible Chronic Kidney Disease.    Anion gap 16 (H) 5 - 15  Glucose, capillary     Status: Abnormal   Collection Time: 05/01/15  5:42 AM  Result Value Ref  Range   Glucose-Capillary 219 (H) 65 - 99 mg/dL   Comment 1 Notify RN    Comment 2 Document in Chart   Glucose, capillary     Status: Abnormal   Collection Time: 05/01/15   1:53 PM  Result Value Ref Range   Glucose-Capillary 360 (H) 65 - 99 mg/dL  Glucose, capillary     Status: Abnormal   Collection Time: 05/01/15  4:40 PM  Result Value Ref Range   Glucose-Capillary 371 (H) 65 - 99 mg/dL  Glucose, capillary     Status: Abnormal   Collection Time: 05/01/15 10:18 PM  Result Value Ref Range   Glucose-Capillary 218 (H) 65 - 99 mg/dL  CBC     Status: Abnormal   Collection Time: 05/02/15  2:39 AM  Result Value Ref Range   WBC 6.7 4.0 - 10.5 K/uL   RBC 3.39 (L) 3.87 - 5.11 MIL/uL   Hemoglobin 10.1 (L) 12.0 - 15.0 g/dL   HCT 28.7 (L) 36.0 - 46.0 %   MCV 84.7 78.0 - 100.0 fL   MCH 29.8 26.0 - 34.0 pg   MCHC 35.2 30.0 - 36.0 g/dL   RDW 18.0 (H) 11.5 - 15.5 %   Platelets 328 150 - 400 K/uL  Protime-INR     Status: Abnormal   Collection Time: 05/02/15  2:39 AM  Result Value Ref Range   Prothrombin Time 18.3 (H) 11.6 - 15.2 seconds   INR 1.52 (H) 0.00 - 1.49  APTT     Status: None   Collection Time: 05/02/15  2:39 AM  Result Value Ref Range   aPTT 32 24 - 37 seconds  Glucose, capillary     Status: Abnormal   Collection Time: 05/02/15  6:20 AM  Result Value Ref Range   Glucose-Capillary 209 (H) 65 - 99 mg/dL     ROS:  A comprehensive review of systems was negative except for: Respiratory: positive for Shortness of breath  Physical Exam: Filed Vitals:   05/02/15 0623  BP: 133/47  Pulse: 55  Temp: 97.8 F (36.6 C)  Resp: 19     General: Obese, AAOx3, AAF HEENT: MMM, NO LAD, Supple.  Neck: difficult to tell JVD.  Heart: Bradycardia, No MGR.  Lungs: Diffuse crackles bilaterally, appropriate rate, unlabored.  Abdomen: Obese, soft, nontender Extremities: Pitting edema to dependent areas, and L stump.  Skin: Warm and dry Neuro: AAOx3, no focal deficits.   Assessment/Plan: 81 year old black female with multiple medical issues including CKD.  She is status post a hospitalization in September and comes back again with complaints of shortness of  breath attributable to congestive heart failure versus pneumonia. On Appropriate treatment she's developed acute on chronic renal failure 1.Renal- acute on chronic renal failure since November 9. Cannot identify any specific nephrotoxins. As above, her blood pressure has been in the 110's she has been bradycardic. Possibly  ATN from hypoperfusion? All blood pressure medications have been stopped. Also in the differential would be interstitial nephritis from antibiotics.  Vanc related nephrotoxicity seems unlikely because the level is not high. This is a difficult situation because now her kidney numbers are quite abnormal. Likely due to poor cardiac output. Pt. Is volume overloaded. Renal ultrasound essentially normal. Poor urine output since yesterday.  - Cardiorenal syndrome more likely, with FENA of 0.8%. - Renal U/S normal.  - Renal panel pending this am.  - Will likely need dialysis for volume removal and RRT for now. Hopefully her kidneys will recover.  - Dialysis  catheter placement today.  - Probable dialysis today.  - Continue lasix. 65m BID.    2. Hypertension/volume  - BP's now on the lower side. she seems volume overloaded. Continue lasix. Will need HD as above, but gentle volume removal to avoid hypotension.  3. Hyperkalemia- she has been given Kayexalate. On Lasix. Pending renal panel today for electrolytes.  4. Anemia  - hemoglobin around 10. No action at this time   [facility]11/15/2016, 7:44 AM

## 2015-05-02 NOTE — Progress Notes (Signed)
OT Cancellation Note  Patient Details Name: FLARA BESLER MRN: [id number] DOB: November 29, 1929   Cancelled Treatment:    Reason Eval/Treat Not Completed: Patient at procedure or test/ unavailable (IR for tunneled catheter) Will continue to follow.  Malka So 05/02/2015, 8:40 AM

## 2015-05-02 NOTE — Progress Notes (Signed)
Patient Name: [name] Date of Encounter: 05/02/2015  Primary Cardiologist: Dr. Meda Coffee   Principal Problem:   Acute respiratory failure with hypoxia [facility]) Active Problems:   DM (diabetes mellitus), type 2, uncontrolled, with renal complications ([place])   Hyperlipidemia   Essential hypertension   Coronary artery disease due to lipid rich plaque; 70% ostial-proximal AV Groove Cx - not good PCI target   Acute on chronic diastolic heart failure (HCC)   HCAP (healthcare-associated pneumonia)   NSVT (nonsustained ventricular tachycardia) (HCC)   Acute on chronic congestive heart failure (HCC)   AKI (acute kidney injury) ([place])    SUBJECTIVE  Denies any CP, still has SOB. Feels sleepy this morning. R IJ dialysis catheter placed this morning. Has 3-4 weeks of cough. Nurse emptied urine during exam, had only 272ml from 4AM to 10AM.   CURRENT MEDS . allopurinol  100 mg Oral Daily  . aspirin EC  81 mg Oral Daily  . atorvastatin  40 mg Oral q1800  . calcitRIOL  0.25 mcg Oral Daily  . enoxaparin (LOVENOX) injection  30 mg Subcutaneous QHS  . ezetimibe  10 mg Oral q morning - 10a  . feeding supplement (GLUCERNA SHAKE)  237 mL Oral q morning - 10a  . furosemide  40 mg Intravenous Q12H  . gabapentin  300 mg Oral QHS  . heparin      . insulin aspart  0-15 Units Subcutaneous TID WC  . insulin aspart  0-5 Units Subcutaneous QHS  . insulin aspart  3 Units Subcutaneous TID WC  . insulin detemir  20 Units Subcutaneous BID  . ipratropium-albuterol  3 mL Nebulization TID  . isosorbide mononitrate  60 mg Oral Daily  . latanoprost  1 drop Both Eyes QHS  . levofloxacin (LEVAQUIN) IV  500 mg Intravenous Q48H  . lidocaine      . methylPREDNISolone (SOLU-MEDROL) injection  40 mg Intravenous Q12H  . mometasone-formoterol  2 puff Inhalation BID  . pantoprazole  40 mg Oral BID  . ranolazine  500 mg Oral BID  . sertraline  100 mg Oral Daily  . sodium chloride  3 mL Intravenous Q12H     OBJECTIVE  Filed Vitals:   05/01/15 2115 05/01/15 2117 05/02/15 0623 05/02/15 1008  BP: 110/58  133/47 135/56  Pulse: 88 57 55 50  Temp: 97.8 F (36.6 C)  97.8 F (36.6 C) 98.3 F (36.8 C)  TempSrc: Oral  Oral Oral  Resp: $Remo'20 18 19 18  'QZBOM$ Height:      Weight:   198 lb 11.2 oz (90.131 kg)   SpO2: 95% 95% 95% 93%    Intake/Output Summary (Last 24 hours) at 05/02/15 1045 Last data filed at 05/02/15 0938  Gross per 24 hour  Intake    120 ml  Output    325 ml  Net   -205 ml   Filed Weights   04/30/15 0537 05/01/15 0405 05/02/15 0623  Weight: 195 lb 5.6 oz (88.61 kg) 198 lb 12.8 oz (90.175 kg) 198 lb 11.2 oz (90.131 kg)    PHYSICAL EXAM  General: Pleasant, NAD. Neuro: Alert and oriented X 3. Moves all extremities spontaneously. Psych: Normal affect. HEENT:  Normal  Neck: Supple without bruits or JVD. Lungs:  Resp regular and unlabored. largely CTA with some rhonchi Heart: RRR no s3, s4, or murmurs. Abdomen: Soft, non-tender, non-distended, BS + x 4.  Extremities: No clubbing, cyanosis or edema. L BKA.   Accessory Clinical Findings  CBC  Recent  Labs  05/02/15 0239  WBC 6.7  HGB 10.1*  HCT 28.7*  MCV 84.7  PLT 185   Basic Metabolic Panel  Recent Labs  05/01/15 0454 05/02/15 0836  NA 136 137  K 4.0 3.7  CL 94* 96*  CO2 26 23  GLUCOSE 232* 211*  BUN 107* 124*  CREATININE 6.49* 7.27*  CALCIUM 7.9* 7.9*  PHOS  --  8.2*   Liver Function Tests  Recent Labs  05/02/15 0836  ALBUMIN 3.0*    TELE Junctional bradycardia    ECG  No new EKG  Echocardiogram 06/23/2014  LV EF: 50% -  55%  ------------------------------------------------------------------- Indications:   MI - acute 410.91.  ------------------------------------------------------------------- History:  PMH:  Coronary artery disease. Congestive heart failure. Chronic obstructive pulmonary disease. Risk factors: Hypertension. Diabetes mellitus.  Dyslipidemia.  ------------------------------------------------------------------- Study Conclusions  - Left ventricle: The cavity size was normal. There was moderate concentric hypertrophy. Systolic function was normal. The estimated ejection fraction was in the range of 50% to 55%. Wall motion was normal; there were no regional wall motion abnormalities. Doppler parameters are consistent with a reversible restrictive pattern, indicative of decreased left ventricular diastolic compliance and/or increased left atrial pressure (grade 3 diastolic dysfunction). - Aortic valve: Cusp separation was reduced. - Mitral valve: Moderately calcified annulus. - Left atrium: The atrium was mildly dilated.    Radiology/Studies  Dg Chest 2 View  04/26/2015  CLINICAL DATA:  Shortness of breath. EXAM: CHEST  2 VIEW COMPARISON:  04/24/2015. FINDINGS: Cardiomegaly with pulmonary vascular prominence and mild interstitial prominence, improved from prior exam. Findings consistent with improving congestive heart failure. Cardiomegaly remains severe. Underlying process such as pericardial effusion, cardiomyopathy, or cardiac valve disease cannot be excluded . No pleural effusion or pneumothorax . IMPRESSION: 1. Improving congestive heart failure. 2. Persistent severe cardiomegaly. Underlying process such as pericardial fusion, cardiomyopathy, cardiac valve disease cannot be excluded . Electronically Signed   By: Marcello Moores  Register   On: 04/26/2015 07:47   Dg Chest 2 View  04/24/2015  CLINICAL DATA:  Five day history of upper urinary tract infection. No improvement after antibiotics. Productive cough. EXAM: CHEST  2 VIEW COMPARISON:  04/19/2015 FINDINGS: The heart is enlarged but appears relatively stable. There is tortuosity and calcification of the thoracic aorta. Diffuse bilateral airspace process could reflect pulmonary edema or bilateral infiltrates. No definite pleural effusions. The bony thorax is  intact. IMPRESSION: Cardiac enlargement. Diffuse airspace process could reflect pulmonary edema or bilateral infiltrates. No definite pleural effusions. Electronically Signed   By: Marijo Sanes M.D.   On: 04/24/2015 17:16   Dg Chest 2 View  04/19/2015  CLINICAL DATA:  Cough for 1 week.  Wheezing for 2 days.  Congestion. EXAM: CHEST  2 VIEW COMPARISON:  03/06/2015 FINDINGS: There is cardiomegaly with vascular congestion and mild perihilar opacities concerning for early edema. No effusions. No acute bony abnormality. IMPRESSION: Findings concerning for mild CHF. Electronically Signed   By: Rolm Baptise M.D.   On: 04/19/2015 12:42   US Renal  05/01/2015  CLINICAL DATA:  Renal failure. EXAM: RENAL / URINARY TRACT ULTRASOUND COMPLETE COMPARISON:  CT, 12/05/2013 FINDINGS: Right Kidney: Length: 10.6 cm. Echogenicity within normal limits. No mass or hydronephrosis visualized. Left Kidney: Length: 10.3 cm. Echogenicity within normal limits. No mass or hydronephrosis visualized. Bladder: Decompressed with a Foley catheter IMPRESSION: Normal renal ultrasound. Electronically Signed   By: Lajean Manes M.D.   On: 05/01/2015 12:46   Ir Fluoro Guide Cv Line Right  05/02/2015  INDICATION: Acute on chronic renal insufficiency. Please place temporary dialysis catheter for the initiation of dialysis. EXAM: NON-TUNNELED CENTRAL VENOUS HEMODIALYSIS CATHETER PLACEMENT WITH ULTRASOUND AND FLUOROSCOPIC GUIDANCE COMPARISON:  None. MEDICATIONS: None CONTRAST:  None FLUOROSCOPY TIME:  24 seconds (63.8 mGy) COMPLICATIONS: None immediate PROCEDURE: Informed written consent was obtained from the patient after a discussion of the risks, benefits, and alternatives to treatment. Questions regarding the procedure were encouraged and answered. The right neck and chest were prepped with chlorhexidine in a sterile fashion, and a sterile drape was applied covering the operative field. Maximum barrier sterile technique with sterile gowns and  gloves were used for the procedure. A timeout was performed prior to the initiation of the procedure. After creating a small venotomy incision, a micropuncture kit was utilized to access the right internal jugular vein under direct, real-time ultrasound guidance after the overlying soft tissues were anesthetized with 1% lidocaine with epinephrine. Ultrasound image documentation was performed. The microwire was kinked to measure appropriate catheter length. A stiff glidewire was advanced to the level of the IVC. Under fluoroscopic guidance, the venotomy was serially dilated, ultimately allowing placement of a 20 cm temporary Trialysis catheter with tip ultimately terminating within the superior aspect of the right atrium. Final catheter positioning was confirmed and documented with a spot radiographic image. The catheter aspirates and flushes normally. The catheter was flushed with appropriate volume heparin dwells. The catheter exit site was secured with a 0-Prolene retention suture. A dressing was placed. The patient tolerated the procedure well without immediate post procedural complication. IMPRESSION: Successful placement of a right internal jugular approach 20 cm temporary dialysis catheter with tip terminating with in the superior aspect of the right atrium. This catheter may be converted to a tunneled dialysis catheter at a later date as indicated. The catheter is ready for immediate use. Electronically Signed   By: Sandi Mariscal M.D.   On: 05/02/2015 09:53   Ir US Guide Vasc Access Right  05/02/2015  INDICATION: Acute on chronic renal insufficiency. Please place temporary dialysis catheter for the initiation of dialysis. EXAM: NON-TUNNELED CENTRAL VENOUS HEMODIALYSIS CATHETER PLACEMENT WITH ULTRASOUND AND FLUOROSCOPIC GUIDANCE COMPARISON:  None. MEDICATIONS: None CONTRAST:  None FLUOROSCOPY TIME:  24 seconds (75.6 mGy) COMPLICATIONS: None immediate PROCEDURE: Informed written consent was obtained from the  patient after a discussion of the risks, benefits, and alternatives to treatment. Questions regarding the procedure were encouraged and answered. The right neck and chest were prepped with chlorhexidine in a sterile fashion, and a sterile drape was applied covering the operative field. Maximum barrier sterile technique with sterile gowns and gloves were used for the procedure. A timeout was performed prior to the initiation of the procedure. After creating a small venotomy incision, a micropuncture kit was utilized to access the right internal jugular vein under direct, real-time ultrasound guidance after the overlying soft tissues were anesthetized with 1% lidocaine with epinephrine. Ultrasound image documentation was performed. The microwire was kinked to measure appropriate catheter length. A stiff glidewire was advanced to the level of the IVC. Under fluoroscopic guidance, the venotomy was serially dilated, ultimately allowing placement of a 20 cm temporary Trialysis catheter with tip ultimately terminating within the superior aspect of the right atrium. Final catheter positioning was confirmed and documented with a spot radiographic image. The catheter aspirates and flushes normally. The catheter was flushed with appropriate volume heparin dwells. The catheter exit site was secured with a 0-Prolene retention suture. A dressing was placed. The patient tolerated  the procedure well without immediate post procedural complication. IMPRESSION: Successful placement of a right internal jugular approach 20 cm temporary dialysis catheter with tip terminating with in the superior aspect of the right atrium. This catheter may be converted to a tunneled dialysis catheter at a later date as indicated. The catheter is ready for immediate use. Electronically Signed   By: Sandi Mariscal M.D.   On: 05/02/2015 09:53   Dg Chest Port 1 View  04/28/2015  CLINICAL DATA:  Cough, wheezing for 2 weeks. EXAM: PORTABLE CHEST 1 VIEW  COMPARISON:  04/26/2015 FINDINGS: Cardiomegaly. Decreasing lung volumes with increasing perihilar and lower lobe airspace opacities concerning for CHF. No effusions. No acute bony abnormality. IMPRESSION: Cardiomegaly with increasing bilateral airspace disease, likely edema/ CHF. Low lung volumes. Electronically Signed   By: Rolm Baptise M.D.   On: 04/28/2015 11:11    ASSESSMENT AND PLAN  81 y.o. female with past medical history significant for diabetes mellitus, hypertension, hyperlipidemia, obesity, OSA on CPAP, COPD, coronary artery disease with also a mild cardiomyopathy EF in the 40s, PAD status post BKA as well as CKD appearing to have a creatinine of between 1.6 and 2.0 and is followed by Dr. Lorrene Reid at [facility]. Last cath 02/2015 non obstructed disease. There was mild left ventricular systolic dysfunction. Severely elevated LVEDP of 33-38 mmHg suggestive of significant diastolic heart failure.  1. Acute on chronic diastolic HF  - diuresis limited by worsening renal function, appears to have persistent junctional rhythm on telemetry without obvious p waves in the last 24 hours. ?if loss of atrial kick would further exacerbate renal insufficiency. Will repeat EKG  - unfortunately she may need temp dialysis  - unclear if RHC would be helpful in this case, despite persistent junctional bradycardia, his SBP has improved to 110-130s range after stopping BB.  2. HCAP: per IM  3. Acute on chronic renal insufficiency: felt to be ATN per nephrology  4. NSVT   - started on bisoprolol, cut back to 2.$RemoveB'5mg'VRbPNkjV$  however continue to have significant bradycardia, BB stopped.   5. CAD: nonobstructive on cath 03/07/2015 EF 45-50%  Signed, Almyra Deforest PA-C Pager: [phone]   The patient was seen, examined and discussed with Almyra Deforest, PA-C and I agree with the above.   Creatinine continues to increase, however positive fluid balance. Followed by nephrology who is planning on starting HD later  today, she has obtained a catheter earlier today. Hyperkalemia resolved after receiving Kayexalate and calcium gluconate yesterday, K now 4. She is significantly fluid overloaded with LE edema and SOB. HR improved some after discontinuing b-blocker. No further NSVT.  Dorothy Spark 05/02/2015

## 2015-05-02 NOTE — Progress Notes (Signed)
Placed patient on CPAP with 3L O2 bled in. Patient is tolerating it well and RT will continue to monitor.  

## 2015-05-02 NOTE — Progress Notes (Signed)
SLP Cancellation Note  Patient Details Name: [name] MRN: [id number] DOB: [birth year]   Cancelled treatment:        Pt was NPO from procedure when checked this am. Will see next date  [place]ranford Mon.Ed CCC-SLP Pager [phone]     Houston Siren 05/02/2015, 3:10 PM

## 2015-05-02 NOTE — Progress Notes (Signed)
Inpatient Diabetes Program Recommendations  AACE/ADA: New Consensus Statement on Inpatient Glycemic Control (2015)  Target Ranges:  Prepandial:   less than 140 mg/dL      Peak postprandial:   less than 180 mg/dL (1-2 hours)      Critically ill patients:  140 - 180 mg/dL   Review of Glycemic Control Diabetes history: DM 2 Outpatient Diabetes medications: 70/30 14 units Daily Current orders for Inpatient glycemic control: Levemir 20 units BID, Novolog 0-15 units TID with meals, Novolog 0-5 units QHS, Novolog 3 units TID meal coverage  Inpatient Diabetes Program Recommendations: Insulin - Basal: If steroids are continued as ordered (Solumedrol 40 mg Q12H), please consider increasing Levemir to 24 units BID. Insulin - Meal Coverage: If steroids are continued as ordered, please consider increasing Novolog meal coverage to 6 units TID with meals.  Thanks, Barnie Alderman, RN, MSN, CDE Diabetes Coordinator Inpatient Diabetes Program [phone] (Team Pager from [place] to [place]) [phone] ([place] office) [phone] [facility] office) [phone] [facility] office)

## 2015-05-02 NOTE — Procedures (Signed)
Successful placement of non-tunneled HD catheter with tips terminating within the superior aspect of the right atrium.   The patient tolerated the procedure well without immediate post procedural complication.  The catheter is ready for immediate use.   Jay Gillie Crisci, MD Pager #: [phone]   

## 2015-05-03 ENCOUNTER — Inpatient Hospital Stay (HOSPITAL_COMMUNITY): Payer: Medicare Other

## 2015-05-03 DIAGNOSIS — I509 Heart failure, unspecified: Secondary | ICD-10-CM

## 2015-05-03 LAB — RENAL FUNCTION PANEL
ANION GAP: 18 — AB (ref 5–15)
Albumin: 3 g/dL — ABNORMAL LOW (ref 3.5–5.0)
BUN: 140 mg/dL — ABNORMAL HIGH (ref 6–20)
CO2: 26 mmol/L (ref 22–32)
Calcium: 8.1 mg/dL — ABNORMAL LOW (ref 8.9–10.3)
Chloride: 94 mmol/L — ABNORMAL LOW (ref 101–111)
Creatinine, Ser: 7.67 mg/dL — ABNORMAL HIGH (ref 0.44–1.00)
GFR calc Af Amer: 5 mL/min — ABNORMAL LOW (ref >60)
GFR calc non Af Amer: 4 mL/min — ABNORMAL LOW (ref >60)
GLUCOSE: 256 mg/dL — AB (ref 65–99)
PHOSPHORUS: 8.4 mg/dL — AB (ref 2.5–4.6)
POTASSIUM: 4.4 mmol/L (ref 3.5–5.1)
Sodium: 138 mmol/L (ref 135–145)

## 2015-05-03 LAB — GLUCOSE, CAPILLARY
GLUCOSE-CAPILLARY: 165 mg/dL — AB (ref 65–99)
GLUCOSE-CAPILLARY: 252 mg/dL — AB (ref 65–99)
GLUCOSE-CAPILLARY: 158 mg/dL — AB (ref 65–99)
Glucose-Capillary: 157 mg/dL — ABNORMAL HIGH (ref 65–99)

## 2015-05-03 LAB — CBC
HEMATOCRIT: 32.4 % — AB (ref 36.0–46.0)
HEMOGLOBIN: 10.4 g/dL — AB (ref 12.0–15.0)
MCH: 25.8 pg — AB (ref 26.0–34.0)
MCHC: 32.1 g/dL (ref 30.0–36.0)
MCV: 80.4 fL (ref 78.0–100.0)
Platelets: 269 10*3/uL (ref 150–400)
RBC: 4.03 MIL/uL (ref 3.87–5.11)
RDW: 18.3 % — ABNORMAL HIGH (ref 11.5–15.5)
WBC: 7.1 10*3/uL (ref 4.0–10.5)

## 2015-05-03 MED ORDER — PENTAFLUOROPROP-TETRAFLUOROETH EX AERO: 1.0000 "application " | INHALATION_SPRAY | CUTANEOUS | Status: DC | PRN

## 2015-05-03 MED ORDER — LIDOCAINE-PRILOCAINE 2.5-2.5 % EX CREA: 1.0000 "application " | TOPICAL_CREAM | CUTANEOUS | Status: DC | PRN

## 2015-05-03 MED ORDER — LIDOCAINE HCL (PF) 1 % IJ SOLN: 5.0000 mL | INTRAMUSCULAR | Status: DC | PRN

## 2015-05-03 MED ORDER — HEPARIN SODIUM (PORCINE) 1000 UNIT/ML DIALYSIS: 1000.0000 [IU] | INTRAMUSCULAR | Status: DC | PRN

## 2015-05-03 MED ORDER — ALTEPLASE 2 MG IJ SOLR: 2.0000 mg | Freq: Once | INTRAMUSCULAR | Status: DC | PRN

## 2015-05-03 MED ORDER — SODIUM CHLORIDE 0.9 % IV SOLN: 100.0000 mL | INTRAVENOUS | Status: DC | PRN

## 2015-05-03 MED ORDER — INSULIN ASPART 100 UNIT/ML ~~LOC~~ SOLN
3.0000 [IU] | Freq: Three times a day (TID) | SUBCUTANEOUS | Status: DC
  Administered 2015-05-06 – 2015-05-08 (×6): 3 [IU] via SUBCUTANEOUS

## 2015-05-03 NOTE — Progress Notes (Signed)
  Speech Pathology  MBSS complete. Full report located under chart review in imaging section. DG swallow.   Recommendations: Mild pharyngeal dysphagia as evidenced by delayed swallow initiation to pyriform sinuses due to decreased pharyngeal sensation. Mild pyriform sinus residue due to decreased laryngeal elevation cleared with verbal cues for second swallow. Pt coughed during MBS without barium observed in laryngeal vestibule during study. Recommend pt continue regular texture diet, thin liquids, straws allowed, small sips and bites, pills with thin (one at a time). Follow up x 1 for education.   Orbie Pyo [place].Ed [employer] [phone]

## 2015-05-03 NOTE — Progress Notes (Signed)
Speech Language Pathology Treatment: Dysphagia  Patient Details Name: [name] MRN: [id number] DOB: [birth year] Today's Date: 05/03/2015 Time: [id number] SLP Time Calculation (min) (ACUTE ONLY): 13 min  Assessment / Plan / Recommendation Clinical Impression  Pt observed by ST since initial assessment 11/11. Pt coughing at baseline as well as consistently following each thin liquid straw and cup sip. History of esophageal dysphagia and current questionable pna, therefore recommend MBS to assess pharyngeal phase of swallow and make safest recommendations. MBS scheduled for 1330 today. She can continue regular diet texture and thin liquids until MBS.   HPI HPI: [name] is an 81 y.o. female patient with history of chronic combined CHF (EF 45-50 percent & grade 3 diastolic dysfunction by [facility] 02/2015), CAD, DM 2, HTN, HLD, OSA on nightly CPAP, PAD status post left BKA, stage III CKD, COPD/? Asthma, bradycardia (hence not on beta blockers), recent hospitalization 9/19-9/22 for unstable angina at which time cath showed stable anatomy, now presented to [facility] ED on 04/24/15 with worsening dyspnea, orthopnea, productive cough, pleuritic appearing chest pain but no fevers. Recently seen by PCP and started on azithromycin for suspected pneumonia & Lasix dose increased. Noncompliant with fluid and water restriction. Admitted for decompensated CHF & question of pneumonia. Per MD notes, on 11/11 patient began to c/o coughing while eating.       SLP Plan  MBS     Recommendations  Diet recommendations: Regular;Thin liquid Liquids provided via: Cup;No straw Medication Administration: Whole meds with puree Supervision: Patient able to self feed;Intermittent supervision to cue for compensatory strategies Compensations: Small sips/bites;Slow rate;Follow solids with liquid Postural Changes and/or Swallow Maneuvers: Seated upright 90 degrees;Upright 30-60 min after meal              Oral Care  Recommendations: Oral care BID Follow up Recommendations:  (tbd) Plan: MBS   [name] 05/03/2015, 8:16 AM  [id number]

## 2015-05-03 NOTE — Progress Notes (Signed)
[facility] ROUNDING NOTE   Subjective:   Interval History:  Returned from dialysis this morning  Objective:  Vital signs in last 24 hours:  Temp:  [97.5 F (36.4 C)-97.9 F (36.6 C)] 97.8 F (36.6 C) (11/16 0702) Pulse Rate:  [48-100] 52 (11/16 0702) Resp:  [16-22] 20 (11/16 0702) BP: (113-153)/(44-79) 147/49 mmHg (11/16 0702) SpO2:  [90 %-100 %] 95 % (11/16 1014) Weight:  [91.4 kg (201 lb 8 oz)] 91.4 kg (201 lb 8 oz) (11/16 2993)  Weight change: 1.269 kg (2 lb 12.8 oz) Filed Weights   05/01/15 0405 05/02/15 0623 05/03/15 0627  Weight: 90.175 kg (198 lb 12.8 oz) 90.131 kg (198 lb 11.2 oz) 91.4 kg (201 lb 8 oz)    Intake/Output: I/O last 3 completed shifts: In: 517 [P.O.:517] Out: 2300 [Urine:800; Other:1500]   Intake/Output this shift:  Total I/O In: 120 [P.O.:120] Out: 400 [Urine:400]  General: Obese, AAOx3, AAF HEENT: MMM, NO LAD, Supple.  Neck: difficult to tell JVD.  Heart: Bradycardia, No MGR.  Lungs: Diffuse crackles bilaterally, appropriate rate, unlabored.  Abdomen: Obese, soft, nontender Extremities: Pitting edema to dependent areas, and L stump.  Skin: Warm and dry Neuro: AAOx3, no focal deficits.   Basic Metabolic Panel:  Recent Labs Lab 04/29/15 1102 04/30/15 0233 05/01/15 0454 05/02/15 0836 05/03/15 0540  NA 134* 132* 136 137 138  K 5.2* 5.9* 4.0 3.7 4.4  CL 95* 94* 94* 96* 94*  CO2 24 21* $Remo'26 23 26  'PmWzV$ GLUCOSE 434* 324* 232* 211* 256*  BUN 71* 87* 107* 124* 140*  CREATININE 4.35* 5.37* 6.49* 7.27* 7.67*  CALCIUM 8.5* 8.7* 7.9* 7.9* 8.1*  PHOS  --   --   --  8.2* 8.4*    Liver Function Tests:  Recent Labs Lab 05/02/15 0836 05/03/15 0540  ALBUMIN 3.0* 3.0*   No results for input(s): LIPASE, AMYLASE in the last 168 hours. No results for input(s): AMMONIA in the last 168 hours.  CBC:  Recent Labs Lab 04/28/15 0620 05/02/15 0239 05/03/15 0540  WBC 10.5 6.7 7.1  HGB 9.8* 10.1* 10.4*  HCT 27.8* 28.7* 32.4*   MCV 89.4 84.7 80.4  PLT 333 328 269    Cardiac Enzymes: No results for input(s): CKTOTAL, CKMB, CKMBINDEX, TROPONINI in the last 168 hours.  BNP: Invalid input(s): POCBNP  CBG:  Recent Labs Lab 05/02/15 0620 05/02/15 1135 05/02/15 1645 05/02/15 2122 05/03/15 0701  GLUCAP 209* 184* 263* 157* 157*    Microbiology: Results for orders placed or performed during the hospital encounter of 04/24/15  Blood culture (routine x 2)     Status: None   Collection Time: 04/24/15  8:54 PM  Result Value Ref Range Status   Specimen Description BLOOD LEFT ANTECUBITAL  Final   Special Requests BOTTLES DRAWN AEROBIC AND ANAEROBIC 5CC   Final   Culture NO GROWTH 5 DAYS  Final   Report Status 04/29/2015 FINAL  Final  Blood culture (routine x 2)     Status: None   Collection Time: 04/24/15  8:59 PM  Result Value Ref Range Status   Specimen Description BLOOD RIGHT ANTECUBITAL  Final   Special Requests BOTTLES DRAWN AEROBIC AND ANAEROBIC 5CC   Final   Culture NO GROWTH 5 DAYS  Final   Report Status 04/29/2015 FINAL  Final  MRSA PCR Screening     Status: None   Collection Time: 04/24/15 10:51 PM  Result Value Ref Range Status   MRSA by PCR NEGATIVE NEGATIVE Final  Comment:        The GeneXpert MRSA Assay (FDA approved for NASAL specimens only), is one component of a comprehensive MRSA colonization surveillance program. It is not intended to diagnose MRSA infection nor to guide or monitor treatment for MRSA infections.   Urine culture     Status: None   Collection Time: 04/30/15  3:38 PM  Result Value Ref Range Status   Specimen Description URINE, CATHETERIZED  Final   Special Requests NONE  Final   Culture NO GROWTH 1 DAY  Final   Report Status 05/01/2015 FINAL  Final    Coagulation Studies:  Recent Labs  05/02/15 0239  LABPROT 18.3*  INR 1.52*    Urinalysis:  Recent Labs  04/30/15 1538  COLORURINE RED*  LABSPEC 1.025  PHURINE 5.0  GLUCOSEU 100*  HGBUR LARGE*   BILIRUBINUR SMALL*  KETONESUR 15*  PROTEINUR 100*  UROBILINOGEN 1.0  NITRITE NEGATIVE  LEUKOCYTESUR MODERATE*      Imaging: US Renal  05/01/2015  CLINICAL DATA:  Renal failure. EXAM: RENAL / URINARY TRACT ULTRASOUND COMPLETE COMPARISON:  CT, 12/05/2013 FINDINGS: Right Kidney: Length: 10.6 cm. Echogenicity within normal limits. No mass or hydronephrosis visualized. Left Kidney: Length: 10.3 cm. Echogenicity within normal limits. No mass or hydronephrosis visualized. Bladder: Decompressed with a Foley catheter IMPRESSION: Normal renal ultrasound. Electronically Signed   By: Lajean Manes M.D.   On: 05/01/2015 12:46   Ir Fluoro Guide Cv Line Right  05/02/2015  INDICATION: Acute on chronic renal insufficiency. Please place temporary dialysis catheter for the initiation of dialysis. EXAM: NON-TUNNELED CENTRAL VENOUS HEMODIALYSIS CATHETER PLACEMENT WITH ULTRASOUND AND FLUOROSCOPIC GUIDANCE COMPARISON:  None. MEDICATIONS: None CONTRAST:  None FLUOROSCOPY TIME:  24 seconds (50.9 mGy) COMPLICATIONS: None immediate PROCEDURE: Informed written consent was obtained from the patient after a discussion of the risks, benefits, and alternatives to treatment. Questions regarding the procedure were encouraged and answered. The right neck and chest were prepped with chlorhexidine in a sterile fashion, and a sterile drape was applied covering the operative field. Maximum barrier sterile technique with sterile gowns and gloves were used for the procedure. A timeout was performed prior to the initiation of the procedure. After creating a small venotomy incision, a micropuncture kit was utilized to access the right internal jugular vein under direct, real-time ultrasound guidance after the overlying soft tissues were anesthetized with 1% lidocaine with epinephrine. Ultrasound image documentation was performed. The microwire was kinked to measure appropriate catheter length. A stiff glidewire was advanced to the level of  the IVC. Under fluoroscopic guidance, the venotomy was serially dilated, ultimately allowing placement of a 20 cm temporary Trialysis catheter with tip ultimately terminating within the superior aspect of the right atrium. Final catheter positioning was confirmed and documented with a spot radiographic image. The catheter aspirates and flushes normally. The catheter was flushed with appropriate volume heparin dwells. The catheter exit site was secured with a 0-Prolene retention suture. A dressing was placed. The patient tolerated the procedure well without immediate post procedural complication. IMPRESSION: Successful placement of a right internal jugular approach 20 cm temporary dialysis catheter with tip terminating with in the superior aspect of the right atrium. This catheter may be converted to a tunneled dialysis catheter at a later date as indicated. The catheter is ready for immediate use. Electronically Signed   By: Sandi Mariscal M.D.   On: 05/02/2015 09:53   Ir US Guide Vasc Access Right  05/02/2015  INDICATION: Acute on chronic renal insufficiency. Please  place temporary dialysis catheter for the initiation of dialysis. EXAM: NON-TUNNELED CENTRAL VENOUS HEMODIALYSIS CATHETER PLACEMENT WITH ULTRASOUND AND FLUOROSCOPIC GUIDANCE COMPARISON:  None. MEDICATIONS: None CONTRAST:  None FLUOROSCOPY TIME:  24 seconds (15.3 mGy) COMPLICATIONS: None immediate PROCEDURE: Informed written consent was obtained from the patient after a discussion of the risks, benefits, and alternatives to treatment. Questions regarding the procedure were encouraged and answered. The right neck and chest were prepped with chlorhexidine in a sterile fashion, and a sterile drape was applied covering the operative field. Maximum barrier sterile technique with sterile gowns and gloves were used for the procedure. A timeout was performed prior to the initiation of the procedure. After creating a small venotomy incision, a micropuncture kit  was utilized to access the right internal jugular vein under direct, real-time ultrasound guidance after the overlying soft tissues were anesthetized with 1% lidocaine with epinephrine. Ultrasound image documentation was performed. The microwire was kinked to measure appropriate catheter length. A stiff glidewire was advanced to the level of the IVC. Under fluoroscopic guidance, the venotomy was serially dilated, ultimately allowing placement of a 20 cm temporary Trialysis catheter with tip ultimately terminating within the superior aspect of the right atrium. Final catheter positioning was confirmed and documented with a spot radiographic image. The catheter aspirates and flushes normally. The catheter was flushed with appropriate volume heparin dwells. The catheter exit site was secured with a 0-Prolene retention suture. A dressing was placed. The patient tolerated the procedure well without immediate post procedural complication. IMPRESSION: Successful placement of a right internal jugular approach 20 cm temporary dialysis catheter with tip terminating with in the superior aspect of the right atrium. This catheter may be converted to a tunneled dialysis catheter at a later date as indicated. The catheter is ready for immediate use. Electronically Signed   By: Simonne Come M.D.   On: 05/02/2015 09:53     Medications:     . allopurinol  100 mg Oral Daily  . aspirin EC  81 mg Oral Daily  . atorvastatin  40 mg Oral q1800  . calcitRIOL  0.25 mcg Oral Daily  . enoxaparin (LOVENOX) injection  30 mg Subcutaneous QHS  . ezetimibe  10 mg Oral q morning - 10a  . feeding supplement (GLUCERNA SHAKE)  237 mL Oral q morning - 10a  . furosemide  40 mg Intravenous Q12H  . gabapentin  300 mg Oral QHS  . insulin aspart  0-15 Units Subcutaneous TID WC  . insulin aspart  0-5 Units Subcutaneous QHS  . insulin aspart  3 Units Subcutaneous TID WC  . insulin detemir  20 Units Subcutaneous BID  . ipratropium-albuterol   3 mL Nebulization TID  . isosorbide mononitrate  60 mg Oral Daily  . latanoprost  1 drop Both Eyes QHS  . levofloxacin (LEVAQUIN) IV  500 mg Intravenous Q48H  . mometasone-formoterol  2 puff Inhalation BID  . pantoprazole  40 mg Oral BID  . ranolazine  500 mg Oral BID  . sertraline  100 mg Oral Daily  . sodium chloride  3 mL Intravenous Q12H   sodium chloride, sodium chloride, acetaminophen **OR** acetaminophen, albuterol, alteplase, guaiFENesin, heparin, HYDROcodone-acetaminophen, lidocaine (PF), lidocaine-prilocaine, menthol-cetylpyridinium, nitroGLYCERIN, ondansetron **OR** ondansetron (ZOFRAN) IV, pentafluoroprop-tetrafluoroeth  Assessment/ Plan:  Admitted with worsening edema and shortness of breath. Cardiac catheterization in September 2016. Treated with zithromycin for pneumonia. Did receive vancomycin now stopped. Blood pressure variability but not in shock.  This is puzzling but probably acute tubular necrosis.  1.Renal- acute on chronic renal failure since November 9. Cannot identify any specific nephrotoxins. As above, her blood pressure has been in the 110's she has been bradycardic. Possibly ATN from hypoperfusion? All blood pressure medications have been stopped. Also in the differential would be interstitial nephritis from antibiotics. Vanc related nephrotoxicity seems unlikely because the level is not high. This is a difficult situation because now her kidney numbers are quite abnormal. Likely due to poor cardiac output. Pt. Is volume overloaded. Renal ultrasound essentially normal. Poor urine output since yesterday.  - Cardiorenal syndrome more likely, with FENA of 0.8%. - Renal U/S normal.  - Renal panel pending this am.  -  Began dialysis  ( treatment was 11/16 )  - Dialysis catheter placement 11/16 - Probable dialysis today.  - Continue lasix. $RemoveBeforeD'40mg'kgUvPwGRjCEEEd$  BID.   2. Hypertension/volume - BP's now on the lower side. she seems volume overloaded. Continue lasix.   Started on dialysis 11/16 for fluid removal  No antihypertensives  3. Hyperkalemia-  Resolved now 4. Anemia - hemoglobin around 10. No action at this time     LOS: 9 [name] W $RemoveBefor'@TODAY'CbZuIAivsqkL$ '@11'$ :20 AM

## 2015-05-03 NOTE — Progress Notes (Signed)
[facility] Progress Note   [name]  JHH:[id number]  DOB: [birth year]  DOA: 04/24/2015 PCP: Renato Shin, MD  Brief narrative: [name] is a 81 y.o. female  point chronic kidney disease stage III, coronary artery disease, chronic systolic and diastolic heart failure, obstructive sleep apnea, peripheral vascular disease status post left BKA, type 2 diabetes mellitus on insulin presents to the hospital for shortness of breath and cough and is found to have pulmonary edema. She was recently treated with Zithromax and increased dose of Lasix by her PCP however continued to be short of breath at rest. She was started on diuretics after admission however creatinine rose steadily without much results and diuresis. Nephrology consult was requested and it was decided for her to undergo dialysis to help remove excess fluid.   Subjective: Feels that her breathing is much better. Continue to have a cough which sounds congested but unable to cough up the mucous  Assessment/Plan: Principal Problem:   Acute respiratory failure with hypoxia -  Acute on chronic diastolic heart failure- AKI - significant rise in creatinine with diuresis - dialysis started on 11/15-symptoms improving - appreciate cardiology and nephrology assistance   Active Problems: HCAP? - has had a cough with congestion for 2 wks now- CXR obtained today after fluid has been dialyzed off does not reveal infiltrates consistent with pneumonia- likely has acute bronchitis- cont Levaquin - SLP eval- D 3 diet with thin liquids     DM (diabetes mellitus), type 2, uncontrolled, with renal complications   - uses 97/84 insulin at home- 14 U only at breakfast - has been started on Levemir and Novolog in the hospital    Hyperlipidemia -cont statin + zetia     Coronary artery disease due to lipid rich plaque; 70% ostial-proximal AV Groove Cx - not good PCI target -cont Imdur, Ranexa, statin and  Azpirin  Anemia of chronic disease - stable     Code Status:     Code Status Orders        Start     Ordered   04/24/15 2353  Full code   Continuous     04/24/15 2354     Family Communication:  Disposition Plan: home when stable DVT prophylaxis: heparin Consultants:cardiology, nephrology Procedures:   Antibiotics: Anti-infectives    Start     Dose/Rate Route Frequency Ordered Stop   05/02/15 1000  levofloxacin (LEVAQUIN) IVPB 500 mg     500 mg 100 mL/hr over 60 Minutes Intravenous Every 48 hours 04/30/15 0940     04/30/15 1200  vancomycin (VANCOCIN) IVPB 1000 mg/200 mL premix  Status:  Discontinued     1,000 mg 200 mL/hr over 60 Minutes Intravenous Every 48 hours 04/28/15 1254 04/30/15 0918   04/30/15 1100  levofloxacin (LEVAQUIN) IVPB 500 mg  Status:  Discontinued     500 mg 100 mL/hr over 60 Minutes Intravenous Every 48 hours 04/28/15 1052 04/29/15 0956   04/30/15 1000  levofloxacin (LEVAQUIN) IVPB 750 mg     750 mg 100 mL/hr over 90 Minutes Intravenous  Once 04/30/15 0944 04/30/15 1206   04/28/15 1100  levofloxacin (LEVAQUIN) IVPB 750 mg     750 mg 100 mL/hr over 90 Minutes Intravenous  Once 04/28/15 1034 04/28/15 1830   04/28/15 1100  cefTAZidime (FORTAZ) 2 g in dextrose 5 % 50 mL IVPB  Status:  Discontinued     2 g 100 mL/hr over 30 Minutes Intravenous Every 24 hours 04/28/15 1037 04/30/15 0940  04/28/15 1100  vancomycin (VANCOCIN) IVPB 1000 mg/200 mL premix     1,000 mg 200 mL/hr over 60 Minutes Intravenous  Once 04/28/15 1048 04/28/15 1408   04/25/15 2100  vancomycin (VANCOCIN) IVPB 1000 mg/200 mL premix  Status:  Discontinued     1,000 mg 200 mL/hr over 60 Minutes Intravenous Every 24 hours 04/24/15 2032 04/26/15 1302   04/25/15 2030  cefTAZidime (FORTAZ) 2 g in dextrose 5 % 50 mL IVPB  Status:  Discontinued     2 g 100 mL/hr over 30 Minutes Intravenous Every 24 hours 04/24/15 2032 04/26/15 1302   04/25/15 0000  cefTAZidime (FORTAZ) 2 g in dextrose 5 %  50 mL IVPB  Status:  Discontinued     2 g 100 mL/hr over 30 Minutes Intravenous 3 times per day 04/24/15 2354 04/25/15 0018   04/24/15 2030  cefTAZidime (FORTAZ) 2 g in dextrose 5 % 50 mL IVPB  Status:  Discontinued     2 g 100 mL/hr over 30 Minutes Intravenous  Once 04/24/15 2025 04/24/15 2032   04/24/15 2030  vancomycin (VANCOCIN) IVPB 1000 mg/200 mL premix  Status:  Discontinued     1,000 mg 200 mL/hr over 60 Minutes Intravenous  Once 04/24/15 2025 04/24/15 2032      Objective: Filed Weights   05/01/15 0405 05/02/15 0623 05/03/15 0627  Weight: 90.175 kg (198 lb 12.8 oz) 90.131 kg (198 lb 11.2 oz) 91.4 kg (201 lb 8 oz)    Intake/Output Summary (Last 24 hours) at 05/03/15 1501 Last data filed at 05/03/15 1059  Gross per 24 hour  Intake    417 ml  Output   2300 ml  Net  -1883 ml     Vitals Filed Vitals:   05/03/15 0645 05/03/15 0702 05/03/15 1014 05/03/15 1120  BP: 150/69 147/49  138/52  Pulse: 71 52  80  Temp:  97.8 F (36.6 C)  98 F (36.7 C)  TempSrc:  Oral  Oral  Resp: $Remo'20 20  22  'AaMnd$ Height:      Weight:      SpO2:  93% 95% 97%    Exam:  General:  Pt is alert, not in acute distress  HEENT: No icterus, No thrush, oral mucosa moist  Cardiovascular: regular rate and rhythm, S1/S2 No murmur  Respiratory: clear to auscultation bilaterally   Abdomen: Soft, +Bowel sounds, non tender, non distended, no guarding  MSK: No LE edema, cyanosis or clubbing  Data Reviewed: Basic Metabolic Panel:  Recent Labs Lab 04/29/15 1102 04/30/15 0233 05/01/15 0454 05/02/15 0836 05/03/15 0540  NA 134* 132* 136 137 138  K 5.2* 5.9* 4.0 3.7 4.4  CL 95* 94* 94* 96* 94*  CO2 24 21* $Remo'26 23 26  'UetkY$ GLUCOSE 434* 324* 232* 211* 256*  BUN 71* 87* 107* 124* 140*  CREATININE 4.35* 5.37* 6.49* 7.27* 7.67*  CALCIUM 8.5* 8.7* 7.9* 7.9* 8.1*  PHOS  --   --   --  8.2* 8.4*   Liver Function Tests:  Recent Labs Lab 05/02/15 0836 05/03/15 0540  ALBUMIN 3.0* 3.0*   No results for  input(s): LIPASE, AMYLASE in the last 168 hours. No results for input(s): AMMONIA in the last 168 hours. CBC:  Recent Labs Lab 04/28/15 0620 05/02/15 0239 05/03/15 0540  WBC 10.5 6.7 7.1  HGB 9.8* 10.1* 10.4*  HCT 27.8* 28.7* 32.4*  MCV 89.4 84.7 80.4  PLT 333 328 269   Cardiac Enzymes: No results for input(s): CKTOTAL, CKMB, CKMBINDEX, TROPONINI in the  last 168 hours. BNP (last 3 results)  Recent Labs  06/17/14 1330 06/17/14 1946 04/24/15 1738  BNP 1951.5* 1985.7* 1594.7*    ProBNP (last 3 results)  Recent Labs  06/08/14 1617  PROBNP 1249.0*    CBG:  Recent Labs Lab 05/02/15 1135 05/02/15 1645 05/02/15 2122 05/03/15 0701 05/03/15 1141  GLUCAP 184* 263* 157* 157* 165*    Recent Results (from the past 240 hour(s))  Blood culture (routine x 2)     Status: None   Collection Time: 04/24/15  8:54 PM  Result Value Ref Range Status   Specimen Description BLOOD LEFT ANTECUBITAL  Final   Special Requests BOTTLES DRAWN AEROBIC AND ANAEROBIC 5CC   Final   Culture NO GROWTH 5 DAYS  Final   Report Status 04/29/2015 FINAL  Final  Blood culture (routine x 2)     Status: None   Collection Time: 04/24/15  8:59 PM  Result Value Ref Range Status   Specimen Description BLOOD RIGHT ANTECUBITAL  Final   Special Requests BOTTLES DRAWN AEROBIC AND ANAEROBIC 5CC   Final   Culture NO GROWTH 5 DAYS  Final   Report Status 04/29/2015 FINAL  Final  MRSA PCR Screening     Status: None   Collection Time: 04/24/15 10:51 PM  Result Value Ref Range Status   MRSA by PCR NEGATIVE NEGATIVE Final    Comment:        The GeneXpert MRSA Assay (FDA approved for NASAL specimens only), is one component of a comprehensive MRSA colonization surveillance program. It is not intended to diagnose MRSA infection nor to guide or monitor treatment for MRSA infections.   Urine culture     Status: None   Collection Time: 04/30/15  3:38 PM  Result Value Ref Range Status   Specimen Description  URINE, CATHETERIZED  Final   Special Requests NONE  Final   Culture NO GROWTH 1 DAY  Final   Report Status 05/01/2015 FINAL  Final     Studies: Ir Fluoro Guide Cv Line Right  05/02/2015  INDICATION: Acute on chronic renal insufficiency. Please place temporary dialysis catheter for the initiation of dialysis. EXAM: NON-TUNNELED CENTRAL VENOUS HEMODIALYSIS CATHETER PLACEMENT WITH ULTRASOUND AND FLUOROSCOPIC GUIDANCE COMPARISON:  None. MEDICATIONS: None CONTRAST:  None FLUOROSCOPY TIME:  24 seconds (27.7 mGy) COMPLICATIONS: None immediate PROCEDURE: Informed written consent was obtained from the patient after a discussion of the risks, benefits, and alternatives to treatment. Questions regarding the procedure were encouraged and answered. The right neck and chest were prepped with chlorhexidine in a sterile fashion, and a sterile drape was applied covering the operative field. Maximum barrier sterile technique with sterile gowns and gloves were used for the procedure. A timeout was performed prior to the initiation of the procedure. After creating a small venotomy incision, a micropuncture kit was utilized to access the right internal jugular vein under direct, real-time ultrasound guidance after the overlying soft tissues were anesthetized with 1% lidocaine with epinephrine. Ultrasound image documentation was performed. The microwire was kinked to measure appropriate catheter length. A stiff glidewire was advanced to the level of the IVC. Under fluoroscopic guidance, the venotomy was serially dilated, ultimately allowing placement of a 20 cm temporary Trialysis catheter with tip ultimately terminating within the superior aspect of the right atrium. Final catheter positioning was confirmed and documented with a spot radiographic image. The catheter aspirates and flushes normally. The catheter was flushed with appropriate volume heparin dwells. The catheter exit site was secured with a  0-Prolene retention  suture. A dressing was placed. The patient tolerated the procedure well without immediate post procedural complication. IMPRESSION: Successful placement of a right internal jugular approach 20 cm temporary dialysis catheter with tip terminating with in the superior aspect of the right atrium. This catheter may be converted to a tunneled dialysis catheter at a later date as indicated. The catheter is ready for immediate use. Electronically Signed   By: Sandi Mariscal M.D.   On: 05/02/2015 09:53   Ir US Guide Vasc Access Right  05/02/2015  INDICATION: Acute on chronic renal insufficiency. Please place temporary dialysis catheter for the initiation of dialysis. EXAM: NON-TUNNELED CENTRAL VENOUS HEMODIALYSIS CATHETER PLACEMENT WITH ULTRASOUND AND FLUOROSCOPIC GUIDANCE COMPARISON:  None. MEDICATIONS: None CONTRAST:  None FLUOROSCOPY TIME:  24 seconds (89.3 mGy) COMPLICATIONS: None immediate PROCEDURE: Informed written consent was obtained from the patient after a discussion of the risks, benefits, and alternatives to treatment. Questions regarding the procedure were encouraged and answered. The right neck and chest were prepped with chlorhexidine in a sterile fashion, and a sterile drape was applied covering the operative field. Maximum barrier sterile technique with sterile gowns and gloves were used for the procedure. A timeout was performed prior to the initiation of the procedure. After creating a small venotomy incision, a micropuncture kit was utilized to access the right internal jugular vein under direct, real-time ultrasound guidance after the overlying soft tissues were anesthetized with 1% lidocaine with epinephrine. Ultrasound image documentation was performed. The microwire was kinked to measure appropriate catheter length. A stiff glidewire was advanced to the level of the IVC. Under fluoroscopic guidance, the venotomy was serially dilated, ultimately allowing placement of a 20 cm temporary Trialysis  catheter with tip ultimately terminating within the superior aspect of the right atrium. Final catheter positioning was confirmed and documented with a spot radiographic image. The catheter aspirates and flushes normally. The catheter was flushed with appropriate volume heparin dwells. The catheter exit site was secured with a 0-Prolene retention suture. A dressing was placed. The patient tolerated the procedure well without immediate post procedural complication. IMPRESSION: Successful placement of a right internal jugular approach 20 cm temporary dialysis catheter with tip terminating with in the superior aspect of the right atrium. This catheter may be converted to a tunneled dialysis catheter at a later date as indicated. The catheter is ready for immediate use. Electronically Signed   By: Sandi Mariscal M.D.   On: 05/02/2015 09:53   Dg Chest Port 1 View  05/03/2015  CLINICAL DATA:  Shortness of Breath EXAM: PORTABLE CHEST 1 VIEW COMPARISON:  04/28/2015 FINDINGS: Cardiomediastinal silhouette is stable. Dual lumen right IJ catheter with tip in right atrium. Central mild vascular congestion and mild perihilar interstitial prominence suspicious for mild interstitial edema. No segmental infiltrate. IMPRESSION: Cardiomegaly again noted. Dual lumen right IJ catheter with tip in right atrium. Central mild vascular congestion and mild perihilar interstitial prominence suspicious for mild interstitial edema. Electronically Signed   By: Lahoma Crocker M.D.   On: 05/03/2015 11:36    Scheduled Meds:  Scheduled Meds: . allopurinol  100 mg Oral Daily  . aspirin EC  81 mg Oral Daily  . atorvastatin  40 mg Oral q1800  . calcitRIOL  0.25 mcg Oral Daily  . enoxaparin (LOVENOX) injection  30 mg Subcutaneous QHS  . ezetimibe  10 mg Oral q morning - 10a  . feeding supplement (GLUCERNA SHAKE)  237 mL Oral q morning - 10a  . furosemide  40  mg Intravenous Q12H  . gabapentin  300 mg Oral QHS  . insulin aspart  0-15 Units  Subcutaneous TID WC  . insulin aspart  0-5 Units Subcutaneous QHS  . insulin aspart  3 Units Subcutaneous TID WC  . insulin detemir  20 Units Subcutaneous BID  . ipratropium-albuterol  3 mL Nebulization TID  . isosorbide mononitrate  60 mg Oral Daily  . latanoprost  1 drop Both Eyes QHS  . levofloxacin (LEVAQUIN) IV  500 mg Intravenous Q48H  . mometasone-formoterol  2 puff Inhalation BID  . pantoprazole  40 mg Oral BID  . ranolazine  500 mg Oral BID  . sertraline  100 mg Oral Daily  . sodium chloride  3 mL Intravenous Q12H   Continuous Infusions:   Time spent on care of this patient: 35 min   Ronald, MD 05/03/2015, 3:01 PM  LOS: 9 days   [facility] Office  [phone] Pager - Text Page per [email or web address] If 7PM-7AM, please contact night-coverage [email or web address]

## 2015-05-03 NOTE — Progress Notes (Signed)
Subjective: She reports feeling better.  Objective: Vital signs in last 24 hours: Temp:  [97.5 F (36.4 C)-98.3 F (36.8 C)] 97.8 F (36.6 C) (11/16 0702) Pulse Rate:  [48-100] 52 (11/16 0702) Resp:  [16-22] 20 (11/16 0702) BP: (113-153)/(44-79) 147/49 mmHg (11/16 0702) SpO2:  [90 %-100 %] 93 % (11/16 0702) Weight:  [201 lb 8 oz (91.4 kg)] 201 lb 8 oz (91.4 kg) (11/16 0627) Last BM Date: 04/30/15  Intake/Output from previous day: 11/15 0701 - 11/16 0700 In: 397 [P.O.:397] Out: 2100 [Urine:600] Intake/Output this shift:    Medications Scheduled Meds: . allopurinol  100 mg Oral Daily  . aspirin EC  81 mg Oral Daily  . atorvastatin  40 mg Oral q1800  . calcitRIOL  0.25 mcg Oral Daily  . enoxaparin (LOVENOX) injection  30 mg Subcutaneous QHS  . ezetimibe  10 mg Oral q morning - 10a  . feeding supplement (GLUCERNA SHAKE)  237 mL Oral q morning - 10a  . furosemide  40 mg Intravenous Q12H  . gabapentin  300 mg Oral QHS  . insulin aspart  0-15 Units Subcutaneous TID WC  . insulin aspart  0-5 Units Subcutaneous QHS  . insulin aspart  3 Units Subcutaneous TID WC  . insulin detemir  20 Units Subcutaneous BID  . ipratropium-albuterol  3 mL Nebulization TID  . isosorbide mononitrate  60 mg Oral Daily  . latanoprost  1 drop Both Eyes QHS  . levofloxacin (LEVAQUIN) IV  500 mg Intravenous Q48H  . mometasone-formoterol  2 puff Inhalation BID  . pantoprazole  40 mg Oral BID  . ranolazine  500 mg Oral BID  . sertraline  100 mg Oral Daily  . sodium chloride  3 mL Intravenous Q12H   Continuous Infusions:  PRN Meds:.sodium chloride, sodium chloride, acetaminophen **OR** acetaminophen, albuterol, alteplase, guaiFENesin, heparin, HYDROcodone-acetaminophen, lidocaine (PF), lidocaine-prilocaine, menthol-cetylpyridinium, nitroGLYCERIN, ondansetron **OR** ondansetron (ZOFRAN) IV, pentafluoroprop-tetrafluoroeth  PE: General appearance: alert, cooperative and no distress Neck: JVD  elevated Lungs: Bilateral rales Heart: regular rate and rhythm, S1, S2 normal, no murmur, click, rub or gallop Abdomen: +BS, soft, nontender Extremities: 1+ right LEE Pulses: 2+ radials Skin: Warm and dry Neurologic: Grossly normal  Lab Results:   Recent Labs  05/02/15 0239 05/03/15 0540  WBC 6.7 7.1  HGB 10.1* 10.4*  HCT 28.7* 32.4*  PLT 328 269   BMET  Recent Labs  05/01/15 0454 05/02/15 0836 05/03/15 0540  NA 136 137 138  K 4.0 3.7 4.4  CL 94* 96* 94*  CO2 26 23 26   GLUCOSE 232* 211* 256*  BUN 107* 124* 140*  CREATININE 6.49* 7.27* 7.67*  CALCIUM 7.9* 7.9* 8.1*   PT/INR  Recent Labs  05/02/15 0239  LABPROT 18.3*  INR 1.52*    Assessment/Plan   Principal Problem:   Acute respiratory failure with hypoxia (HCC) Active Problems:   DM (diabetes mellitus), type 2, uncontrolled, with renal complications (HCC)   Hyperlipidemia   Essential hypertension   Coronary artery disease due to lipid rich plaque; 70% ostial-proximal AV Groove Cx - not good PCI target   Acute on chronic diastolic heart failure (HCC)   HCAP (healthcare-associated pneumonia)   NSVT (nonsustained ventricular tachycardia) (HCC)   Acute on chronic congestive heart failure (HCC)   AKI (acute kidney injury) ([place])  81 y.o. female with past medical history significant for diabetes mellitus, hypertension, hyperlipidemia, obesity, OSA on CPAP, COPD, coronary artery disease with also a mild cardiomyopathy EF in the 24s, PAD  status post BKA as well as CKD appearing to have a creatinine of between 1.6 and 2.0 and is followed by Dr. Lorrene Reid at [facility]. Last cath 02/2015 non obstructed disease. There was mild left ventricular systolic dysfunction. Severely elevated LVEDP of 33-38 mmHg suggestive of significant diastolic heart failure.  1. Acute on chronic diastolic HF Net fluids:  -1.7L/+1.3L.  Wt has climbed back up to Adm wt. 201#  CXR this morning looks wet.   - diuresis limited  by worsening renal function, appears to have persistent junctional rhythm on telemetry without obvious p waves in the last 24 hours. EKG looks like Junctional rhythm in the 50's - dialysis cath placed. HD probably today.    2. HCAP: per IM  3. Acute on chronic renal insufficiency: felt to be ATN per nephrology.  Dialysis cath place.   4. NSVT  - started on bisoprolol, cut back to 2.5mg  however continue to have significant bradycardia, BB stopped.   5. CAD: nonobstructive on cath 03/07/2015 EF 45-50%   LOS: 9 days    HAGER, BRYAN PA-C 05/03/2015 9:30 AM  The patient was seen, examined and discussed with Tarri Fuller, PA-C and I agree with the above.   Creatinine continues to increase, however positive fluid balance. Followed by nephrology who is planning on starting HD today, she has obtained a catheter yesterday. Hyperkalemia resolved after receiving Kayexalate and calcium gluconate yesterday, K now 4. She is significantly fluid overloaded with LE edema and SOB. HR improved some after discontinuing b-blocker. No further NSVT.  Dorothy Spark 05/03/2015

## 2015-05-03 NOTE — Progress Notes (Signed)
PT Cancellation Note  Patient Details Name: [name] MRN: [id number] DOB: 1929/10/25   Cancelled Treatment:    Reason Eval/Treat Not Completed: Patient at procedure or test/unavailable (Pt still in HD.  Will check back as able.  Thanks.)   Irwin Brakeman F 05/03/2015, 1:32 PM  [facility] Acute [facility] [phone] (pager)

## 2015-05-03 NOTE — Progress Notes (Addendum)
Patient have 7 beats of v.tach, asymptomatic, patient denies cp, no shortness of breath. V/S stable 98.2, HR.55, BP. 135/89, 97%/RA. MD and PA called no respond. Report given to night shift RN. Will continue to monitor patient. Night shift MD notified no new order given.MD instruct to continue to monitor Patient.

## 2015-05-04 DIAGNOSIS — Z794 Long term (current) use of insulin: Secondary | ICD-10-CM

## 2015-05-04 LAB — CBC WITH DIFFERENTIAL/PLATELET
Basophils Absolute: 0 10*3/uL (ref 0.0–0.1)
Basophils Relative: 0 %
EOS PCT: 0 %
Eosinophils Absolute: 0 10*3/uL (ref 0.0–0.7)
HCT: 30.6 % — ABNORMAL LOW (ref 36.0–46.0)
Hemoglobin: 10.3 g/dL — ABNORMAL LOW (ref 12.0–15.0)
LYMPHS ABS: 0.6 10*3/uL — AB (ref 0.7–4.0)
LYMPHS PCT: 5 %
MCH: 28.9 pg (ref 26.0–34.0)
MCHC: 33.7 g/dL (ref 30.0–36.0)
MCV: 86 fL (ref 78.0–100.0)
MONOS PCT: 12 %
Monocytes Absolute: 1.4 10*3/uL — ABNORMAL HIGH (ref 0.1–1.0)
Neutro Abs: 9.7 10*3/uL — ABNORMAL HIGH (ref 1.7–7.7)
Neutrophils Relative %: 83 %
PLATELETS: 292 10*3/uL (ref 150–400)
RBC: 3.56 MIL/uL — AB (ref 3.87–5.11)
RDW: 19 % — ABNORMAL HIGH (ref 11.5–15.5)
WBC: 11.7 10*3/uL — AB (ref 4.0–10.5)

## 2015-05-04 LAB — RENAL FUNCTION PANEL
ANION GAP: 14 (ref 5–15)
Albumin: 2.9 g/dL — ABNORMAL LOW (ref 3.5–5.0)
BUN: 92 mg/dL — ABNORMAL HIGH (ref 6–20)
CHLORIDE: 98 mmol/L — AB (ref 101–111)
CO2: 29 mmol/L (ref 22–32)
CREATININE: 5.12 mg/dL — AB (ref 0.44–1.00)
Calcium: 8.3 mg/dL — ABNORMAL LOW (ref 8.9–10.3)
GFR calc non Af Amer: 7 mL/min — ABNORMAL LOW (ref >60)
GFR, EST AFRICAN AMERICAN: 8 mL/min — AB (ref >60)
GLUCOSE: 124 mg/dL — AB (ref 65–99)
Phosphorus: 6 mg/dL — ABNORMAL HIGH (ref 2.5–4.6)
Potassium: 3.2 mmol/L — ABNORMAL LOW (ref 3.5–5.1)
Sodium: 141 mmol/L (ref 135–145)

## 2015-05-04 LAB — GLUCOSE, CAPILLARY
GLUCOSE-CAPILLARY: 156 mg/dL — AB (ref 65–99)
GLUCOSE-CAPILLARY: 128 mg/dL — AB (ref 65–99)
Glucose-Capillary: 41 mg/dL — CL (ref 65–99)
Glucose-Capillary: 104 mg/dL — ABNORMAL HIGH (ref 65–99)
Glucose-Capillary: 63 mg/dL — ABNORMAL LOW (ref 65–99)
Glucose-Capillary: 95 mg/dL (ref 65–99)

## 2015-05-04 LAB — HEPATITIS B SURFACE ANTIBODY,QUALITATIVE: HEP B S AB: NONREACTIVE

## 2015-05-04 LAB — HEPATITIS B CORE ANTIBODY, TOTAL: Hep B Core Total Ab: NEGATIVE

## 2015-05-04 LAB — HEPATITIS B SURFACE ANTIGEN: HEP B S AG: NEGATIVE

## 2015-05-04 MED ORDER — GLUCERNA SHAKE PO LIQD
237.0000 mL | Freq: Two times a day (BID) | ORAL | Status: DC
Start: 2015-05-04 — End: 2015-05-15
  Administered 2015-05-04 – 2015-05-15 (×18): 237 mL via ORAL

## 2015-05-04 MED ORDER — ADULT MULTIVITAMIN W/MINERALS CH
1.0000 | ORAL_TABLET | Freq: Every day | ORAL | Status: DC
  Administered 2015-05-04 – 2015-05-15 (×10): 1 via ORAL
  Filled 2015-05-04 (×10): qty 1

## 2015-05-04 MED ORDER — FUROSEMIDE 10 MG/ML IJ SOLN
40.0000 mg | Freq: Two times a day (BID) | INTRAMUSCULAR | Status: DC
  Administered 2015-05-04 – 2015-05-08 (×9): 40 mg via INTRAVENOUS
  Filled 2015-05-04 (×9): qty 4

## 2015-05-04 MED ORDER — INSULIN DETEMIR 100 UNIT/ML ~~LOC~~ SOLN
16.0000 [IU] | Freq: Every day | SUBCUTANEOUS | Status: DC
  Administered 2015-05-04 – 2015-05-13 (×10): 16 [IU] via SUBCUTANEOUS
  Filled 2015-05-04 (×11): qty 0.16

## 2015-05-04 NOTE — Progress Notes (Signed)
Pt's spo2 in RA  87% (it dropped to 87% from 90%),  Oxygen started via [place] at 2l/min, pt is sitting comfortably in chair at this point, will continue to monitor the patient.

## 2015-05-04 NOTE — Progress Notes (Signed)
Speech Language Pathology Treatment: Dysphagia  Patient Details Name: [name] MRN: [id number] DOB: [birth year] Today's Date: 05/04/2015 Time: 8177-1165 SLP Time Calculation (min) (ACUTE ONLY): 14 min  Assessment / Plan / Recommendation Clinical Impression  This therapist arrived and pt just completed lunch with husband present. Reviewed results of yesterday's MBS. Repositioned pt upright in bed and observed with thin liquid via straw with min delayed throat clear. Pt was observed to cough and throat clear during MBS without penetration or aspiration seen. Pt states typically wears upper partial when eating which she did not have donned during lunch. SLP cleaned and pt stated she wanted to donn. Reviewed reflux precautions. Recommend continue regular texture and thin liquids with swallow strategies. No further ST needed.     HPI HPI: [name] is an 81 y.o. female patient with history of chronic combined CHF (EF 45-50 percent & grade 3 diastolic dysfunction by [facility] 02/2015), CAD, DM 2, HTN, HLD, OSA on nightly CPAP, PAD status post left BKA, stage III CKD, COPD/? Asthma, bradycardia (hence not on beta blockers), recent hospitalization 9/19-9/22 for unstable angina at which time cath showed stable anatomy, now presented to [facility] ED on 04/24/15 with worsening dyspnea, orthopnea, productive cough, pleuritic appearing chest pain but no fevers. Recently seen by PCP and started on azithromycin for suspected pneumonia & Lasix dose increased. Noncompliant with fluid and water restriction. Admitted for decompensated CHF & question of pneumonia. Per MD notes, on 11/11 patient began to c/o coughing while eating. MBS recommended after swallow session this am.       SLP Plan  Discharge SLP treatment due to (comment);All goals met     Recommendations  Diet recommendations: Regular;Thin liquid Liquids provided via: Cup;Straw Medication Administration: Whole meds with puree Supervision:  Patient able to self feed;Intermittent supervision to cue for compensatory strategies Compensations: Slow rate;Small sips/bites;Multiple dry swallows after each bite/sip Postural Changes and/or Swallow Maneuvers: Seated upright 90 degrees;Upright 30-60 min after meal              Oral Care Recommendations: Oral care BID Follow up Recommendations: None Plan: Discharge SLP treatment due to (comment);All goals met   Houston Siren 05/04/2015, 2:01 PM   Orbie Pyo Colvin Caroli.Ed [employer] [phone]

## 2015-05-04 NOTE — Progress Notes (Signed)
Physical Therapy Treatment Patient Details Name: [name] MRN: [id number] DOB: 1929/08/31 Today's Date: 05/04/2015    History of Present Illness [name] is a 81 y.o. female with history of nonobstructive CAD who has been recently admitted in September 2016 for unstable angina and at that time patient had unremarkable cardiac cath and patient had medications adjusted presents to the ER as patient has been having increasing shortness of breath over the last 1 week. Patient also has been having some productive cough with greenish sputum. Patient was placed on azithromycin by patient's primary care physician last week with increasing dose of Lasix from 40 mg daily to twice daily. Despite this patient has been getting short of breath even at rest. PMH includes L BKA, DM, heart failure, HTN, NSTEMI    PT Comments    Today was pts first day up with prosthesis (Following HD).  Pt stood from high bed and transferred to chair with RW.  Pt fatiqued half way during transfer and needed max assist x 2 to sit safely.  I still feel that SNF is best for pt to get extra help and extra therapy to get stronger prior to returning home.  Husband was present during therapy session - he still wants to take her home but not sure this is realistic goal.   Follow Up Recommendations  SNF;Supervision/Assistance - 24 hour     Equipment Recommendations  None recommended by PT    Recommendations for Other Services       Precautions / Restrictions Precautions Precautions: Fall Precaution Comments: L BKA - today was pts first time up wiht prosthesis in weeks Restrictions Weight Bearing Restrictions: No    Mobility  Bed Mobility Overal bed mobility: +2 for physical assistance;+ 2 for safety/equipment Bed Mobility: Supine to Sit     Supine to sit: Min assist;+2 for physical assistance;HOB elevated     General bed mobility comments: was able to assist with PT and OT to sit up and scoot  forward but needs assist under trunk and to slide hips with bed pad  Transfers Overall transfer level: Needs assistance Equipment used: Rolling walker (2 wheeled) (left prosthesis - with two three-ply) Transfers: Sit to/from Stand (stand turn with RW) Sit to Stand: +2 physical assistance         General transfer comment: pt stood from high bed with min assist x 2.  pt able to get standing balance.  pt felt steady on her feet - but forward flexes and looks at floor.  pt took some short steps to chair with right foot.  she got half way to chair and got fatiqued and needed max A x 2 to isit safely in the correct spot on chair.  Pt reports she just got too tired.  Pt encouraged to take deep breaths when sitting -she is congested and coughing with changes in position  Ambulation/Gait             General Gait Details: Unable due to weakness   Stairs            Wheelchair Mobility    Modified Rankin (Stroke Patients Only)       Balance       Sitting balance - Comments: pt able to sit unsupported on side of bed while husband donned her prosthesis                            Cognition Arousal/Alertness:  Awake/alert Behavior During Therapy: WFL for tasks assessed/performed Overall Cognitive Status: Within Functional Limits for tasks assessed                      Exercises      General Comments General comments (skin integrity, edema, etc.): no skin issues observed.  Husband present and observed therapy session - still feel that SNF best for pt to help her get stronger with extra help and more therapy available      Pertinent Vitals/Pain Pain Assessment: No/denies pain (pt denies dizziness)    Home Living                      Prior Function            PT Goals (current goals can now be found in the care plan section) Progress towards PT goals: Progressing toward goals    Frequency  Min 3X/week    PT Plan Current plan remains  appropriate    Co-evaluation             End of Session Equipment Utilized During Treatment: Gait belt Activity Tolerance: Patient tolerated treatment well;Patient limited by fatigue (pt agreed to get up 2 hours after getting HD) Patient left: in chair;with call bell/phone within reach;with family/visitor present     Time: 1405-1430 PT Time Calculation (min) (ACUTE ONLY): 25 min  Charges:  $Therapeutic Activity: 23-37 mins                    G Codes:      Loyal Buba 05/04/2015, 2:45 PM 05/04/2015   Rande Lawman, PT

## 2015-05-04 NOTE — Progress Notes (Signed)
Talked to patient with spouse present about DCP. Patient is now agreeable to SNF placement. Butch Penny Soc Worker made aware and will see patient. Mindi Slicker [facility] [phone]

## 2015-05-04 NOTE — Care Management Important Message (Signed)
Important Message  Patient Details  Name: [name] MRN: [id number] Date of Birth: 1929-09-04   Medicare Important Message Given:  Yes    [name] P Trea Latner 05/04/2015, 12:14 PM

## 2015-05-04 NOTE — Progress Notes (Signed)
Occupational Therapy Treatment Patient Details Name: [name] MRN: [id number] DOB: 05-May-1930 Today's Date: 05/04/2015    History of present illness [name] is a 81 y.o. female with history of nonobstructive CAD who has been recently admitted in September 2016 for unstable angina and at that time patient had unremarkable cardiac cath and patient had medications adjusted presents to the ER as patient has been having increasing shortness of breath over the last 1 week. Patient also has been having some productive cough with greenish sputum. Patient was placed on azithromycin by patient's primary care physician last week with increasing dose of Lasix from 40 mg daily to twice daily. Despite this patient has been getting short of breath even at rest. PMH includes L BKA, DM, heart failure, HTN, NSTEMI   OT comments  Pt still with limited endurance overall as well as the need for at least max assist for functional transfers and +2 assist for LB selfcare tasks sit to stand.  Feel pt will benefit from continued acute care OT to increase independence back to a level that her and her husband can manage at home.  May benefit from [facility] consult as pt's husband is not open to SNF level rehab at this time.  Feel pt will have difficulty tolerating 3 hours of rehab daily but may be able to participate in a modifed schedule of 2 1/2 hours over 7 days.    Follow Up Recommendations  CIR    Equipment Recommendations  Other (comment) (TBD depending on discharge setting)    Recommendations for Other Services Rehab consult    Precautions / Restrictions Precautions Precautions: Fall Precaution Comments: L BKA - today was pts first time up wiht prosthesis in weeks Restrictions Weight Bearing Restrictions: No       Mobility Bed Mobility Overal bed mobility: +2 for physical assistance;+ 2 for safety/equipment Bed Mobility: Sit to Supine     Supine to sit: Min assist;+2 for physical  assistance;HOB elevated Sit to supine: Max assist   General bed mobility comments: Pt needed assistance with lifting LEs into the bed and scooting to the [facility].  Transfers Overall transfer level: Needs assistance Equipment used:  (LLE prosthesis) Transfers: Sit to/from Stand Sit to Stand: Max assist         General transfer comment: Pt stood from bedside chair with max assist initiallly.  [name] assisted with brining LLE up under her as she could not efficiently bring it back for better alignment in preparation for stepping.  She was able to stand step, advancing the RLE while supporting some weight on the LLE and therapist providing support.      Balance Overall balance assessment: Needs assistance Sitting-balance support: Bilateral upper extremity supported Sitting balance-Leahy Scale: Fair Sitting balance - Comments: pt able to sit unsupported on side of bed while husband donned her prosthesis     Standing balance-Leahy Scale: Poor                     ADL Overall ADL's : Needs assistance/impaired                     Lower Body Dressing: Maximal assistance;Sitting/lateral leans   Toilet Transfer: Maximal assistance;Stand-pivot   Toileting- Clothing Manipulation and Hygiene: Maximal assistance;Sit to/from stand Toileting - Clothing Manipulation Details (indicate cue type and reason): simulated     Functional mobility during ADLs: Maximal assistance General ADL Comments: Pt able to complete sit to stand with  max assist and maintain for approximately 1 min including stand pivot from bed to wheelchair.                  Cognition   Behavior During Therapy: WFL for tasks assessed/performed Overall Cognitive Status: Within Functional Limits for tasks assessed                                    Pertinent Vitals/ Pain       Pain Assessment: No/denies pain         Frequency Min 3X/week     Progress Toward Goals  OT Goals(current  goals can now be found in the care plan section)  Progress towards OT goals: Progressing toward goals     Plan Discharge plan needs to be updated       End of Session Equipment Utilized During Treatment: Oxygen   Activity Tolerance Patient tolerated treatment well   Patient Left in bed;with call bell/phone within reach   Nurse Communication Mobility status;Other (comment) (IV needing re-taping)        Time: [id number] OT Time Calculation (min): 26 min  Charges: OT General Charges $OT Visit: 1 Procedure OT Treatments $Self Care/Home Management : 23-37 mins  Keval Nam OTR/L 05/04/2015, 5:07 PM

## 2015-05-04 NOTE — Progress Notes (Signed)
Pt having second episode of HD today, pt looks better, tolerating meds and food well, appetite is not that good though, Foley catheter continue, IVABX levaquin provided, this morning and afternoon's  insulin hold because of having low blood sugar, pt's husband is in bed side and is updating about plan of care, will continue to monitor the patient.

## 2015-05-04 NOTE — Progress Notes (Signed)
Patient Name: [name] Date of Encounter: 05/04/2015  Primary Cardiologist: Dr. Meda Coffee   Principal Problem:   Acute respiratory failure with hypoxia [facility]) Active Problems:   DM (diabetes mellitus), type 2, uncontrolled, with renal complications ([place])   Hyperlipidemia   Essential hypertension   Coronary artery disease due to lipid rich plaque; 70% ostial-proximal AV Groove Cx - not good PCI target   Acute on chronic diastolic heart failure (HCC)   HCAP (healthcare-associated pneumonia)   NSVT (nonsustained ventricular tachycardia) (HCC)   Acute on chronic congestive heart failure (HCC)   AKI (acute kidney injury) ([place])    SUBJECTIVE  SOB improving, had some LLQ abdominal discomfort this morning. Denies any CP.   CURRENT MEDS . allopurinol  100 mg Oral Daily  . aspirin EC  81 mg Oral Daily  . atorvastatin  40 mg Oral q1800  . calcitRIOL  0.25 mcg Oral Daily  . enoxaparin (LOVENOX) injection  30 mg Subcutaneous QHS  . ezetimibe  10 mg Oral q morning - 10a  . feeding supplement (GLUCERNA SHAKE)  237 mL Oral q morning - 10a  . furosemide  40 mg Intravenous BID  . gabapentin  300 mg Oral QHS  . insulin aspart  0-15 Units Subcutaneous TID WC  . insulin aspart  0-5 Units Subcutaneous QHS  . insulin aspart  3 Units Subcutaneous TID WC  . insulin detemir  16 Units Subcutaneous QHS  . ipratropium-albuterol  3 mL Nebulization TID  . isosorbide mononitrate  60 mg Oral Daily  . latanoprost  1 drop Both Eyes QHS  . levofloxacin (LEVAQUIN) IV  500 mg Intravenous Q48H  . mometasone-formoterol  2 puff Inhalation BID  . pantoprazole  40 mg Oral BID  . ranolazine  500 mg Oral BID  . sertraline  100 mg Oral Daily  . sodium chloride  3 mL Intravenous Q12H    OBJECTIVE  Filed Vitals:   05/04/15 0850 05/04/15 0930 05/04/15 1000 05/04/15 1030  BP: 142/60 110/68 152/64 140/70  Pulse: 49 52 52 58  Temp:      TempSrc:      Resp:      Height:      Weight:      SpO2:          Intake/Output Summary (Last 24 hours) at 05/04/15 1131 Last data filed at 05/04/15 [id number]  Gross per 24 hour  Intake    110 ml  Output    950 ml  Net   -840 ml   Filed Weights   05/03/15 0627 05/04/15 0634 05/04/15 0838  Weight: 201 lb 8 oz (91.4 kg) 190 lb (86.183 kg) 194 lb 3.6 oz (88.1 kg)    PHYSICAL EXAM  General: Pleasant, NAD. Neuro: Alert and oriented X 3. Moves all extremities spontaneously. Psych: Normal affect. HEENT:  Normal  Neck: Supple without bruits or JVD. Lungs:  Resp regular and unlabored. Anterior exam CTA Heart: RRR no s3, s4, or murmurs. Abdomen: Soft, non-tender, non-distended, BS + x 4.  Extremities: No clubbing, cyanosis. L BKA. Trace R prebitial edema  Accessory Clinical Findings  CBC  Recent Labs  05/03/15 0540 05/04/15 0900  WBC 7.1 11.7*  NEUTROABS  --  9.7*  HGB 10.4* 10.3*  HCT 32.4* 30.6*  MCV 80.4 86.0  PLT 269 [id number]   Basic Metabolic Panel  Recent Labs  05/03/15 0540 05/04/15 0900  NA 138 141  K 4.4 3.2*  CL 94* 98*  CO2 26 29  GLUCOSE 256* 124*  BUN 140* 92*  CREATININE 7.67* 5.12*  CALCIUM 8.1* 8.3*  PHOS 8.4* 6.0*   Liver Function Tests  Recent Labs  05/03/15 0540 05/04/15 0900  ALBUMIN 3.0* 2.9*    TELE Junctional bradycardia transitioned to wide complex bradycardia with retrograde p waves, then transitioned to sinus brady    ECG  No new EKG  Echocardiogram 06/23/2014  LV EF: 50% -  55%  ------------------------------------------------------------------- Indications:   MI - acute 410.91.  ------------------------------------------------------------------- History:  PMH:  Coronary artery disease. Congestive heart failure. Chronic obstructive pulmonary disease. Risk factors: Hypertension. Diabetes mellitus. Dyslipidemia.  ------------------------------------------------------------------- Study Conclusions  - Left ventricle: The cavity size was normal. There was moderate concentric  hypertrophy. Systolic function was normal. The estimated ejection fraction was in the range of 50% to 55%. Wall motion was normal; there were no regional wall motion abnormalities. Doppler parameters are consistent with a reversible restrictive pattern, indicative of decreased left ventricular diastolic compliance and/or increased left atrial pressure (grade 3 diastolic dysfunction). - Aortic valve: Cusp separation was reduced. - Mitral valve: Moderately calcified annulus. - Left atrium: The atrium was mildly dilated.    Radiology/Studies  US Renal  05/01/2015  CLINICAL DATA:  Renal failure. EXAM: RENAL / URINARY TRACT ULTRASOUND COMPLETE COMPARISON:  CT, 12/05/2013 FINDINGS: Right Kidney: Length: 10.6 cm. Echogenicity within normal limits. No mass or hydronephrosis visualized. Left Kidney: Length: 10.3 cm. Echogenicity within normal limits. No mass or hydronephrosis visualized. Bladder: Decompressed with a Foley catheter IMPRESSION: Normal renal ultrasound. Electronically Signed   By: Lajean Manes M.D.   On: 05/01/2015 12:46   Dg Chest Port 1 View  05/03/2015  CLINICAL DATA:  Shortness of Breath EXAM: PORTABLE CHEST 1 VIEW COMPARISON:  04/28/2015 FINDINGS: Cardiomediastinal silhouette is stable. Dual lumen right IJ catheter with tip in right atrium. Central mild vascular congestion and mild perihilar interstitial prominence suspicious for mild interstitial edema. No segmental infiltrate. IMPRESSION: Cardiomegaly again noted. Dual lumen right IJ catheter with tip in right atrium. Central mild vascular congestion and mild perihilar interstitial prominence suspicious for mild interstitial edema. Electronically Signed   By: Lahoma Crocker M.D.   On: 05/03/2015 11:36   Dg Chest Port 1 View  04/28/2015  CLINICAL DATA:  Cough, wheezing for 2 weeks. EXAM: PORTABLE CHEST 1 VIEW COMPARISON:  04/26/2015 FINDINGS: Cardiomegaly. Decreasing lung volumes with increasing perihilar and lower lobe  airspace opacities concerning for CHF. No effusions. No acute bony abnormality. IMPRESSION: Cardiomegaly with increasing bilateral airspace disease, likely edema/ CHF. Low lung volumes. Electronically Signed   By: Rolm Baptise M.D.   On: 04/28/2015 11:11               ASSESSMENT AND PLAN  80 y.o. female with past medical history significant for diabetes mellitus, hypertension, hyperlipidemia, obesity, OSA on CPAP, COPD, coronary artery disease with also a mild cardiomyopathy EF in the 40s, PAD status post BKA as well as CKD appearing to have a creatinine of between 1.6 and 2.0 and is followed by Dr. Lorrene Reid at [facility]. Last cath 02/2015 non obstructed disease. There was mild left ventricular systolic dysfunction. Severely elevated LVEDP of 33-38 mmHg suggestive of significant diastolic heart failure.  1. Acute on chronic diastolic HF  - fluid overloaded with declining renal function requiring temp dialysis. Currently in dialysis, SOB improved. Still trace edema in pretibial area, expect to improve with dialysis  - diuresis limited by worsening renal function  2. Junctional rhythm  - appears  to have persistent junctional rhythm on telemetry without obvious p waves. BB stopped. In last 24 hours, her junctional bradycardia is improving, with HR increasing to 50-60s and presence of p waves. It appears she has converted to sinus rhythm  3. HCAP: per IM  4. Acute on chronic renal insufficiency: felt to be ATN per nephrology. Undergoing temp dialysis.  - BP stable with HD  5. NSVT: off BB due to persistent junctional rhythm  - appears to have recurrence wide complex rhythm with slow ventricular rate and retrograde p waves on telemetry since 11/16, unclear cause.  6. CAD: nonobstructive on cath 03/07/2015 EF 45-50%  Signed, Almyra Deforest PA-C Pager: [phone]   The patient was seen, examined and discussed with Almyra Deforest, PA-C and I agree with the above.   Creatinine continues to  increase, however positive fluid balance. Followed by nephrology who has initiated HD with symptoms relief Hyperkalemia resolved after receiving Kayexalate and calcium gluconate yesterday, K now 4.She is still significantly fluid overloaded with LE edema and SOB. HR improved some after discontinuing b-blocker. Telemetry shows wide complex rhythm with rate 82 BPM and retrograde P wave. She is asymptomatic with it. We will continue to follow.   Dorothy Spark 05/04/2015

## 2015-05-04 NOTE — Procedures (Signed)
I have seen and examined this patient and agree with the plan of care .Pateint seen on dialysis   Vitals are stable   Noted K 3.2 , will change to added K bath.   Tolerating dialysis  Is set for 2 L ultrafiltration today  Lydia Toren W 05/04/2015, 9:59 AM

## 2015-05-04 NOTE — Progress Notes (Signed)
[facility] Progress Note   [name]  [id number]  DOB: [birth year]  DOA: 04/24/2015 PCP: Renato Shin, MD  Brief narrative: [name] is a 81 y.o. female  point chronic kidney disease stage III, coronary artery disease, chronic systolic and diastolic heart failure, obstructive sleep apnea, peripheral vascular disease status post left BKA, type 2 diabetes mellitus on insulin presents to the hospital for shortness of breath and cough and is found to have pulmonary edema. She was recently treated with Zithromax and increased dose of Lasix by her PCP however continued to be short of breath at rest. She was started on diuretics after admission however creatinine rose steadily without much results and diuresis. Nephrology consult was requested and it was decided for her to undergo dialysis to help remove excess fluid.   Subjective: Dyspnea continues to improve. Cough persists. No chest pain.   Assessment/Plan: Principal Problem:   Acute respiratory failure with hypoxia -  Acute on chronic diastolic heart failure- AKI - significant rise in creatinine with diuresis - dialysis started on 11/15-symptoms improving - appreciate cardiology and nephrology assistance   Active Problems: HCAP? - has had a cough with congestion for 2 wks now- CXR obtained today after fluid has been dialyzed off does not reveal infiltrates consistent with pneumonia- likely has acute bronchitis- cont Levaquin - SLP eval- D 3 diet with thin liquids  Junctional rhythm/ NSVT -junctional rhythm resolved with holding B Blocker - management per cardiology      DM (diabetes mellitus), type 2, uncontrolled, with renal complications   - uses 0000000 insulin at home- 14 U only at breakfast - has been started on Levemir and Novolog in the hospital- she was hypoglycemic this AM- Levemir dosage adjusted    Hyperlipidemia -cont statin + zetia     Coronary artery disease due to lipid rich plaque;  70% ostial-proximal AV Groove Cx - not good PCI target -cont Imdur, Ranexa, statin and Aspirin  Anemia of chronic disease - stable     Code Status:     Code Status Orders        Start     Ordered   04/24/15 2353  Full code   Continuous     04/24/15 2354     Family Communication:  Disposition Plan: SNF recommended by PT DVT prophylaxis: heparin Consultants:cardiology, nephrology Procedures:   Antibiotics: Anti-infectives    Start     Dose/Rate Route Frequency Ordered Stop   05/02/15 1000  levofloxacin (LEVAQUIN) IVPB 500 mg     500 mg 100 mL/hr over 60 Minutes Intravenous Every 48 hours 04/30/15 0940     04/30/15 1200  vancomycin (VANCOCIN) IVPB 1000 mg/200 mL premix  Status:  Discontinued     1,000 mg 200 mL/hr over 60 Minutes Intravenous Every 48 hours 04/28/15 1254 04/30/15 0918   04/30/15 1100  levofloxacin (LEVAQUIN) IVPB 500 mg  Status:  Discontinued     500 mg 100 mL/hr over 60 Minutes Intravenous Every 48 hours 04/28/15 1052 04/29/15 0956   04/30/15 1000  levofloxacin (LEVAQUIN) IVPB 750 mg     750 mg 100 mL/hr over 90 Minutes Intravenous  Once 04/30/15 0944 04/30/15 1206   04/28/15 1100  levofloxacin (LEVAQUIN) IVPB 750 mg     750 mg 100 mL/hr over 90 Minutes Intravenous  Once 04/28/15 1034 04/28/15 1830   04/28/15 1100  cefTAZidime (FORTAZ) 2 g in dextrose 5 % 50 mL IVPB  Status:  Discontinued     2 g 100  mL/hr over 30 Minutes Intravenous Every 24 hours 04/28/15 1037 04/30/15 0940   04/28/15 1100  vancomycin (VANCOCIN) IVPB 1000 mg/200 mL premix     1,000 mg 200 mL/hr over 60 Minutes Intravenous  Once 04/28/15 1048 04/28/15 1408   04/25/15 2100  vancomycin (VANCOCIN) IVPB 1000 mg/200 mL premix  Status:  Discontinued     1,000 mg 200 mL/hr over 60 Minutes Intravenous Every 24 hours 04/24/15 2032 04/26/15 1302   04/25/15 2030  cefTAZidime (FORTAZ) 2 g in dextrose 5 % 50 mL IVPB  Status:  Discontinued     2 g 100 mL/hr over 30 Minutes Intravenous Every 24  hours 04/24/15 2032 04/26/15 1302   04/25/15 0000  cefTAZidime (FORTAZ) 2 g in dextrose 5 % 50 mL IVPB  Status:  Discontinued     2 g 100 mL/hr over 30 Minutes Intravenous 3 times per day 04/24/15 2354 04/25/15 0018   04/24/15 2030  cefTAZidime (FORTAZ) 2 g in dextrose 5 % 50 mL IVPB  Status:  Discontinued     2 g 100 mL/hr over 30 Minutes Intravenous  Once 04/24/15 2025 04/24/15 2032   04/24/15 2030  vancomycin (VANCOCIN) IVPB 1000 mg/200 mL premix  Status:  Discontinued     1,000 mg 200 mL/hr over 60 Minutes Intravenous  Once 04/24/15 2025 04/24/15 2032      Objective: Filed Weights   05/04/15 0634 05/04/15 0838 05/04/15 1121  Weight: 86.183 kg (190 lb) 88.1 kg (194 lb 3.6 oz) 85.4 kg (188 lb 4.4 oz)    Intake/Output Summary (Last 24 hours) at 05/04/15 1538 Last data filed at 05/04/15 1443  Gross per 24 hour  Intake    332 ml  Output   2959 ml  Net  -2627 ml     Vitals Filed Vitals:   05/04/15 1030 05/04/15 1121 05/04/15 1236 05/04/15 1441  BP: 140/70 156/68 185/94 168/63  Pulse: 58  52   Temp:  97.5 F (36.4 C) 97.8 F (36.6 C)   TempSrc:  Oral Oral   Resp:  23 18   Height:      Weight:  85.4 kg (188 lb 4.4 oz)    SpO2:   100%     Exam:  General:  Pt is alert, not in acute distress  HEENT: No icterus, No thrush, oral mucosa moist  Cardiovascular: regular rate and rhythm, S1/S2 No murmur  Respiratory: clear to auscultation bilaterally   Abdomen: Soft, +Bowel sounds, non tender, non distended, no guarding  MSK: No LE edema, cyanosis or clubbing  Data Reviewed: Basic Metabolic Panel:  Recent Labs Lab 04/30/15 0233 05/01/15 0454 05/02/15 0836 05/03/15 0540 05/04/15 0900  NA 132* 136 137 138 141  K 5.9* 4.0 3.7 4.4 3.2*  CL 94* 94* 96* 94* 98*  CO2 21* 26 23 26 29   GLUCOSE 324* 232* 211* 256* 124*  BUN 87* 107* 124* 140* 92*  CREATININE 5.37* 6.49* 7.27* 7.67* 5.12*  CALCIUM 8.7* 7.9* 7.9* 8.1* 8.3*  PHOS  --   --  8.2* 8.4* 6.0*   Liver  Function Tests:  Recent Labs Lab 05/02/15 0836 05/03/15 0540 05/04/15 0900  ALBUMIN 3.0* 3.0* 2.9*   No results for input(s): LIPASE, AMYLASE in the last 168 hours. No results for input(s): AMMONIA in the last 168 hours. CBC:  Recent Labs Lab 04/28/15 0620 05/02/15 0239 05/03/15 0540 05/04/15 0900  WBC 10.5 6.7 7.1 11.7*  NEUTROABS  --   --   --  9.7*  HGB 9.8* 10.1* 10.4* 10.3*  HCT 27.8* 28.7* 32.4* 30.6*  MCV 89.4 84.7 80.4 86.0  PLT 333 328 269 292   Cardiac Enzymes: No results for input(s): CKTOTAL, CKMB, CKMBINDEX, TROPONINI in the last 168 hours. BNP (last 3 results)  Recent Labs  06/17/14 1330 06/17/14 1946 04/24/15 1738  BNP 1951.5* 1985.7* 1594.7*    ProBNP (last 3 results)  Recent Labs  06/08/14 1617  PROBNP 1249.0*    CBG:  Recent Labs Lab 05/03/15 2056 05/04/15 0659 05/04/15 0734 05/04/15 0806 05/04/15 1234  GLUCAP 158* 41* 63* 104* 95    Recent Results (from the past 240 hour(s))  Blood culture (routine x 2)     Status: None   Collection Time: 04/24/15  8:54 PM  Result Value Ref Range Status   Specimen Description BLOOD LEFT ANTECUBITAL  Final   Special Requests BOTTLES DRAWN AEROBIC AND ANAEROBIC 5CC   Final   Culture NO GROWTH 5 DAYS  Final   Report Status 04/29/2015 FINAL  Final  Blood culture (routine x 2)     Status: None   Collection Time: 04/24/15  8:59 PM  Result Value Ref Range Status   Specimen Description BLOOD RIGHT ANTECUBITAL  Final   Special Requests BOTTLES DRAWN AEROBIC AND ANAEROBIC 5CC   Final   Culture NO GROWTH 5 DAYS  Final   Report Status 04/29/2015 FINAL  Final  MRSA PCR Screening     Status: None   Collection Time: 04/24/15 10:51 PM  Result Value Ref Range Status   MRSA by PCR NEGATIVE NEGATIVE Final    Comment:        The GeneXpert MRSA Assay (FDA approved for NASAL specimens only), is one component of a comprehensive MRSA colonization surveillance program. It is not intended to diagnose  MRSA infection nor to guide or monitor treatment for MRSA infections.   Urine culture     Status: None   Collection Time: 04/30/15  3:38 PM  Result Value Ref Range Status   Specimen Description URINE, CATHETERIZED  Final   Special Requests NONE  Final   Culture NO GROWTH 1 DAY  Final   Report Status 05/01/2015 FINAL  Final     Studies: Dg Chest Port 1 View  05/03/2015  CLINICAL DATA:  Shortness of Breath EXAM: PORTABLE CHEST 1 VIEW COMPARISON:  04/28/2015 FINDINGS: Cardiomediastinal silhouette is stable. Dual lumen right IJ catheter with tip in right atrium. Central mild vascular congestion and mild perihilar interstitial prominence suspicious for mild interstitial edema. No segmental infiltrate. IMPRESSION: Cardiomegaly again noted. Dual lumen right IJ catheter with tip in right atrium. Central mild vascular congestion and mild perihilar interstitial prominence suspicious for mild interstitial edema. Electronically Signed   By: Lahoma Crocker M.D.   On: 05/03/2015 11:36   Dg Swallowing Func-speech Pathology  05/03/2015  Objective Swallowing Evaluation:   MBS Patient Details Name: [name] MRN: [id number] Date of Birth: 1929/12/16 Today's Date: 05/03/2015 Time: SLP Start Time (ACUTE ONLY): 1340-SLP Stop Time (ACUTE ONLY): 1400 SLP Time Calculation (min) (ACUTE ONLY): 20 min Past Medical History: Past Medical History Diagnosis Date . Uterine cancer ([place])  . Diabetes mellitus    type II; peripheral neuropathy . Hypertension  . GERD (gastroesophageal reflux disease)  . Peripheral vascular disease ([place])    a. s/p L BKA 08/2012. . Obesity  . Dyslipidemia  . Chronic combined systolic and diastolic CHF (congestive heart failure) ([facility])    a. EF 50-55%, mild LVH and grade 1  diast. Dysfxn b. Grade 3 Diastolic Dysfunction [id number]. b. 02/2015: EF 45-50% by cath. . Osteoarthritis cervical spine  . DM retinopathy ([place])  . Sleep apnea    with CPAP . History of shingles  . COPD (chronic obstructive  pulmonary disease) ([place])  . Depression  . Peptic ulcer disease    duodenal . Peptic stricture of esophagus  . Hiatal hernia  . Diverticulosis  . Arthritis  . CAD (coronary artery disease)    a. s/p multiple caths with nonobs CAD;   b. cath 1/10: pLAD 20%, mLAD 40%, pCFX 20%, mCFX 40%, pRCA 60-70%;   c.  Myoview 06/02/12: Low anterior wall scar, no ischemia, EF 37%. d. cath 06/20/2014 70% mid LCx, medical therapy, high risk for PCI due to close proximity to large OM e. cath 03/07/15 pro RCA 50% & pro to mid Cx 75%, both stable, LVEDP 33-66mm Hg-> medical management . Cardiomyopathy ([place])    a. Echo 05/31/12: EF 40-45%. b. Echo 06/2014: EF 50-55%. c. Cath 02/2015: EF 45-50%. . Asthma    Mild . Pruritic condition    Idiopathic . Zoster  . Noncompliance  . CKD (chronic kidney disease), stage III    Dr. Lorrene Reid . Bradycardia    a. Not on BB due to this. . Shortness of breath dyspnea  Past Surgical History: Past Surgical History Procedure Laterality Date . Tubal ligation  1967 . Knee arthroscopy  10/1998   Left . Craniotomy  1997   Left for SDH . Cataract extraction, bilateral  2005 . Hernia repair   . Esophagogastroduodenoscopy  04/04/2004 . Spine surgery     C-spine and lumbar surgery . Cholecystectomy  2010 . Cardiac catheterization   . Dexa  7/05 . Amputation Left 09/04/2012   Procedure: AMPUTATION BELOW KNEE;  Surgeon: Angelia Mould, MD;  Location: [facility];  Service: Vascular;  Laterality: Left; . Abdominal angiogram N/A 08/31/2012   Procedure: ABDOMINAL ANGIOGRAM with runoff poss intervention;  Surgeon: Angelia Mould, MD;  Location: [facility] CATH LAB;  Service: Cardiovascular;  Laterality: N/A; . Tubal ligation   . Left heart catheterization with coronary angiogram N/A 06/22/2014   Procedure: LEFT HEART CATHETERIZATION WITH CORONARY ANGIOGRAM;  Surgeon: Burnell Blanks, MD;  Location: [facility] CATH LAB;  Service: Cardiovascular;  Laterality: N/A; . Eye surgery   . Brain surgery     1997 blood clot on the brain, then  had to relieve fluid on the brain . Multiple extractions with alveoloplasty N/A 08/01/2014   Procedure: EXTRACTIONS OF TEETH NUMBERS 7 8 9 10 11  AND 19 AND ALVEOLOPLASTY UPPER LEFT AND RIGHT QUADRANT;  Surgeon: Isac Caddy, DDS;  Location: [place];  Service: Oral Surgery;  Laterality: N/A; . Cardiac catheterization N/A 03/07/2015   Procedure: Left Heart Cath and Coronary Angiography;  Surgeon: Leonie Man, MD;  Location: [place] CV LAB;  Service: Cardiovascular;  Laterality: N/A; HPI: NYERA RESA is an 81 y.o. female patient with history of chronic combined CHF (EF 45-50 percent & grade 3 diastolic dysfunction by [facility] 02/2015), CAD, DM 2, HTN, HLD, OSA on nightly CPAP, PAD status post left BKA, stage III CKD, COPD/? Asthma, bradycardia (hence not on beta blockers), recent hospitalization 9/19-9/22 for unstable angina at which time cath showed stable anatomy, now presented to [facility] ED on 04/24/15 with worsening dyspnea, orthopnea, productive cough, pleuritic appearing chest pain but no fevers. Recently seen by PCP and started on azithromycin for suspected pneumonia & Lasix dose increased. Noncompliant with fluid and  water restriction. Admitted for decompensated CHF & question of pneumonia. Per MD notes, on 11/11 patient began to c/o coughing while eating. MBS recommended after swallow session this am.  No Data Recorded Assessment / Plan / Recommendation CHL IP CLINICAL IMPRESSIONS 05/03/2015 Therapy Diagnosis Mild pharyngeal phase dysphagia Clinical Impression Mild pharyngeal dysphagia as evidenced by delayed swallow initiation to pyriform sinuses due to decreased pharyngeal sensation. Mild pyriform sinus residue due to decreased laryngeal elevation cleared with verbal cues for second swallow. Pt coughed during MBS without barium observed in laryngeal vestibule during study. Recommend pt continue regular texture diet, thin liquids, straws allowed, small sips and bites, pills with thin (one at a  time). Follow up x 1 for education.     CHL IP TREATMENT RECOMMENDATION 05/03/2015 Treatment Recommendations Therapy as outlined in treatment plan below   CHL IP DIET RECOMMENDATION 05/03/2015 SLP Diet Recommendations Regular solids;Thin liquid Liquid Administration via Cup;Straw Medication Administration Whole meds with liquid Compensations Slow rate;Small sips/bites;Multiple dry swallows after each bite/sip Postural Changes Seated upright at 90 degrees   CHL IP OTHER RECOMMENDATIONS 05/03/2015 Recommended Consults -- Oral Care Recommendations Oral care BID Other Recommendations --   CHL IP FOLLOW UP RECOMMENDATIONS 05/03/2015 Follow up Recommendations None   CHL IP FREQUENCY AND DURATION 05/03/2015 Speech Therapy Frequency (ACUTE ONLY) min 1 x/week Treatment Duration 1 week      CHL IP ORAL PHASE 05/03/2015 Oral Phase WFL Oral - Pudding Teaspoon -- Oral - Pudding Cup -- Oral - Honey Teaspoon -- Oral - Honey Cup -- Oral - Nectar Teaspoon -- Oral - Nectar Cup -- Oral - Nectar Straw -- Oral - Thin Teaspoon -- Oral - Thin Cup -- Oral - Thin Straw -- Oral - Puree -- Oral - Mech Soft -- Oral - Regular -- Oral - Multi-Consistency -- Oral - Pill -- Oral Phase - Comment --  CHL IP PHARYNGEAL PHASE 05/03/2015 Pharyngeal Phase Thin;Solids Pharyngeal- Pudding Teaspoon -- Pharyngeal -- Pharyngeal- Pudding Cup -- Pharyngeal -- Pharyngeal- Honey Teaspoon -- Pharyngeal -- Pharyngeal- Honey Cup -- Pharyngeal -- Pharyngeal- Nectar Teaspoon -- Pharyngeal -- Pharyngeal- Nectar Cup -- Pharyngeal -- Pharyngeal- Nectar Straw -- Pharyngeal -- Pharyngeal- Thin Teaspoon NT Pharyngeal -- Pharyngeal- Thin Cup Delayed swallow initiation-pyriform sinuses;Pharyngeal residue - pyriform;Reduced laryngeal elevation Pharyngeal -- Pharyngeal- Thin Straw Delayed swallow initiation-pyriform sinuses;Pharyngeal residue - pyriform;Reduced laryngeal elevation Pharyngeal -- Pharyngeal- Puree Delayed swallow initiation-vallecula;Pharyngeal residue -  pyriform;Reduced laryngeal elevation Pharyngeal -- Pharyngeal- Mechanical Soft NT Pharyngeal -- Pharyngeal- Regular Delayed swallow initiation-vallecula Pharyngeal -- Pharyngeal- Multi-consistency NT Pharyngeal -- Pharyngeal- Pill Delayed swallow initiation-vallecula Pharyngeal -- Pharyngeal Comment --  CHL IP CERVICAL ESOPHAGEAL PHASE 05/03/2015 Cervical Esophageal Phase WFL Pudding Teaspoon -- Pudding Cup -- Honey Teaspoon -- Honey Cup -- Nectar Teaspoon -- Nectar Cup -- Nectar Straw -- Thin Teaspoon -- Thin Cup -- Thin Straw -- Puree -- Mechanical Soft -- Regular -- Multi-consistency -- Pill -- Cervical Esophageal Comment -- [place] Siren 05/03/2015, 4:07 PM  Orbie Pyo Colvin Caroli.Ed CCC-SLP Pager [phone]              Scheduled Meds:  Scheduled Meds: . allopurinol  100 mg Oral Daily  . aspirin EC  81 mg Oral Daily  . atorvastatin  40 mg Oral q1800  . calcitRIOL  0.25 mcg Oral Daily  . enoxaparin (LOVENOX) injection  30 mg Subcutaneous QHS  . ezetimibe  10 mg Oral q morning - 10a  . feeding supplement (GLUCERNA SHAKE)  237 mL Oral q morning -  10a  . furosemide  40 mg Intravenous BID  . gabapentin  300 mg Oral QHS  . insulin aspart  0-15 Units Subcutaneous TID WC  . insulin aspart  0-5 Units Subcutaneous QHS  . insulin aspart  3 Units Subcutaneous TID WC  . insulin detemir  16 Units Subcutaneous QHS  . ipratropium-albuterol  3 mL Nebulization TID  . isosorbide mononitrate  60 mg Oral Daily  . latanoprost  1 drop Both Eyes QHS  . levofloxacin (LEVAQUIN) IV  500 mg Intravenous Q48H  . mometasone-formoterol  2 puff Inhalation BID  . pantoprazole  40 mg Oral BID  . ranolazine  500 mg Oral BID  . sertraline  100 mg Oral Daily  . sodium chloride  3 mL Intravenous Q12H   Continuous Infusions:   Time spent on care of this patient: 35 min   [place], MD 05/04/2015, 3:38 PM  LOS: 10 days   [facility] Office  [phone] Pager - Text Page per [email or web address] If  7PM-7AM, please contact night-coverage [email or web address]

## 2015-05-04 NOTE — Progress Notes (Signed)
Pt has 7 beats of V-tach this morning, pt is stable, vitals stable, MD aware, will continue to monitor.

## 2015-05-04 NOTE — Progress Notes (Signed)
Initial Nutrition Assessment  DOCUMENTATION CODES:   Obesity unspecified  INTERVENTION:  Provide Glucerna Shake po BID, each supplement provides 220 kcal and 10 grams of protein Provide snacks BID Provide Multivitamin with minerals daily Recommend providing Biotene due to pt's complaints of dry mouth  NUTRITION DIAGNOSIS:   Inadequate oral intake related to poor appetite, other (see comment) (dry mouth) as evidenced by per patient/family report, percent weight loss.   GOAL:   Patient will meet greater than or equal to 90% of their needs   MONITOR:   PO intake, Supplement acceptance, Weight trends, Labs, I & O's  REASON FOR ASSESSMENT:   LOS    ASSESSMENT:   81 y.o. female point chronic kidney disease stage III, coronary artery disease, chronic systolic and diastolic heart failure, obstructive sleep apnea, peripheral vascular disease status post left BKA, type 2 diabetes mellitus on insulin presents to the hospital for shortness of breath and cough and is found to have pulmonary edema.   Pt states that she has had a poor appetite for the past 3 weeks, mostly due to having dry mouth, and has been eating 50% less than usual. Per nursing notes pt is eating 25-50% of most meals which patient states is similar to how much she's been eating the few weeks PTA. Family at bedside state that patient has lost 10 lbs in the past 2 months. Per weight history patient has lost 5% of her body weight in the past 2 months (not significant for time frame). Pt was drinking Glucerna Shakes once daily PTA. RD encouraged pt to continue using Glucerna to prevent rapid weight loss.   Labs: low hemoglobin, low potassium, low calcium, low GFR, high creatinine/BUN, elevated phosphorus; glucose ranging 41 to 360 mg/dL  Diet Order:  Diet Heart Room service appropriate?: Yes; Fluid consistency:: Thin  Skin:  Reviewed, no issues  Last BM:  11/15  Height:   Ht Readings from Last 1 Encounters:   04/24/15 5\' 6"  (1.676 m)    Weight:   Wt Readings from Last 1 Encounters:  05/04/15 188 lb 4.4 oz (85.4 kg)    Ideal Body Weight:  55.3 kg  BMI:  Body mass index is 30.4 kg/(m^2).  Estimated Nutritional Needs:   Kcal:  1700-2000  Protein:  75-85 grams  Fluid:  1.7 L/day  EDUCATION NEEDS:   No education needs identified at this time  [place], LDN Inpatient Clinical Dietitian Pager: [phone] After Hours Pager: [phone]

## 2015-05-05 DIAGNOSIS — N183 Chronic kidney disease, stage 3 (moderate): Secondary | ICD-10-CM

## 2015-05-05 LAB — RENAL FUNCTION PANEL
ALBUMIN: 2.5 g/dL — AB (ref 3.5–5.0)
Anion gap: 10 (ref 5–15)
BUN: 38 mg/dL — AB (ref 6–20)
CALCIUM: 8.1 mg/dL — AB (ref 8.9–10.3)
CO2: 32 mmol/L (ref 22–32)
Chloride: 99 mmol/L — ABNORMAL LOW (ref 101–111)
Creatinine, Ser: 2.65 mg/dL — ABNORMAL HIGH (ref 0.44–1.00)
GFR calc Af Amer: 18 mL/min — ABNORMAL LOW (ref >60)
GFR, EST NON AFRICAN AMERICAN: 16 mL/min — AB (ref >60)
GLUCOSE: 110 mg/dL — AB (ref 65–99)
PHOSPHORUS: 2.8 mg/dL (ref 2.5–4.6)
Potassium: 3.2 mmol/L — ABNORMAL LOW (ref 3.5–5.1)
SODIUM: 141 mmol/L (ref 135–145)

## 2015-05-05 LAB — CBC
HCT: 35 % — ABNORMAL LOW (ref 36.0–46.0)
Hemoglobin: 10.9 g/dL — ABNORMAL LOW (ref 12.0–15.0)
MCH: 25.7 pg — ABNORMAL LOW (ref 26.0–34.0)
MCHC: 31.1 g/dL (ref 30.0–36.0)
MCV: 82.5 fL (ref 78.0–100.0)
PLATELETS: 286 10*3/uL (ref 150–400)
RBC: 4.24 MIL/uL (ref 3.87–5.11)
RDW: 18.1 % — AB (ref 11.5–15.5)
WBC: 10.1 10*3/uL (ref 4.0–10.5)

## 2015-05-05 LAB — GLUCOSE, CAPILLARY
GLUCOSE-CAPILLARY: 122 mg/dL — AB (ref 65–99)
GLUCOSE-CAPILLARY: 106 mg/dL — AB (ref 65–99)
GLUCOSE-CAPILLARY: 121 mg/dL — AB (ref 65–99)

## 2015-05-05 MED ORDER — BIOTENE DRY MOUTH MT LIQD: 15.0000 mL | OROMUCOSAL | Status: DC | PRN

## 2015-05-05 MED ORDER — SODIUM CHLORIDE 0.9 % IV SOLN: 100.0000 mL | INTRAVENOUS | Status: DC | PRN

## 2015-05-05 MED ORDER — PENTAFLUOROPROP-TETRAFLUOROETH EX AERO: 1.0000 "application " | INHALATION_SPRAY | CUTANEOUS | Status: DC | PRN

## 2015-05-05 MED ORDER — LIDOCAINE HCL (PF) 1 % IJ SOLN: 5.0000 mL | INTRAMUSCULAR | Status: DC | PRN

## 2015-05-05 MED ORDER — HEPARIN SODIUM (PORCINE) 1000 UNIT/ML DIALYSIS: 1000.0000 [IU] | INTRAMUSCULAR | Status: DC | PRN

## 2015-05-05 MED ORDER — LIDOCAINE-PRILOCAINE 2.5-2.5 % EX CREA
1.0000 | TOPICAL_CREAM | CUTANEOUS | Status: DC | PRN
Start: 2015-05-05 — End: 2015-05-05

## 2015-05-05 MED ORDER — ALTEPLASE 2 MG IJ SOLR
2.0000 mg | Freq: Once | INTRAMUSCULAR | Status: DC | PRN
  Filled 2015-05-05: qty 2

## 2015-05-05 NOTE — Progress Notes (Signed)
Placed patient on CPAP for the night at 10cm with oxygen set at 2lpm

## 2015-05-05 NOTE — Progress Notes (Signed)
Inpatient Rehabilitation  Received prescreen request from OT.  Reviewed chart, per PT recommendations patient most likely to be suited for SNF level of therapies.  OT also acknowledged that 3 hours of intensive therapy would likely be difficult for patient to tolerate at this time.  Additionally, with patient's current diagnosis her [place] Medicare is unlikely to authorize an Inpatient Rehab stay.  Therefore at this time not recommending an Inpatient Rehab consult.  Please call if questions.  [facility] Admissions Coordinator Cell [phone] Office [phone]

## 2015-05-05 NOTE — Progress Notes (Signed)
Patient Name: GLYNN LAATSCH Date of Encounter: 05/05/2015  Primary Cardiologist: Dr. Meda Coffee   Principal Problem:   Acute respiratory failure with hypoxia [facility]) Active Problems:   DM (diabetes mellitus), type 2, uncontrolled, with renal complications ([place])   Hyperlipidemia   Essential hypertension   Coronary artery disease due to lipid rich plaque; 70% ostial-proximal AV Groove Cx - not good PCI target   Acute on chronic diastolic heart failure (HCC)   HCAP (healthcare-associated pneumonia)   NSVT (nonsustained ventricular tachycardia) (HCC)   Acute on chronic congestive heart failure (HCC)   AKI (acute kidney injury) ([place])    SUBJECTIVE  Increasing SOB this morning.    CURRENT MEDS . allopurinol  100 mg Oral Daily  . aspirin EC  81 mg Oral Daily  . atorvastatin  40 mg Oral q1800  . calcitRIOL  0.25 mcg Oral Daily  . enoxaparin (LOVENOX) injection  30 mg Subcutaneous QHS  . ezetimibe  10 mg Oral q morning - 10a  . feeding supplement (GLUCERNA SHAKE)  237 mL Oral BID PC  . furosemide  40 mg Intravenous BID  . gabapentin  300 mg Oral QHS  . insulin aspart  0-15 Units Subcutaneous TID WC  . insulin aspart  0-5 Units Subcutaneous QHS  . insulin aspart  3 Units Subcutaneous TID WC  . insulin detemir  16 Units Subcutaneous QHS  . ipratropium-albuterol  3 mL Nebulization TID  . isosorbide mononitrate  60 mg Oral Daily  . latanoprost  1 drop Both Eyes QHS  . levofloxacin (LEVAQUIN) IV  500 mg Intravenous Q48H  . mometasone-formoterol  2 puff Inhalation BID  . multivitamin with minerals  1 tablet Oral Daily  . pantoprazole  40 mg Oral BID  . ranolazine  500 mg Oral BID  . sertraline  100 mg Oral Daily  . sodium chloride  3 mL Intravenous Q12H    OBJECTIVE  Filed Vitals:   05/04/15 2003 05/04/15 2106 05/05/15 0522 05/05/15 0700  BP:  147/50 153/60   Pulse:  60 84   Temp:  97.9 F (36.6 C) 98.4 F (36.9 C)   TempSrc:  Oral Oral   Resp:  18 18   Height:        Weight:    181 lb 1.6 oz (82.146 kg)  SpO2: 98% 99% 97%     Intake/Output Summary (Last 24 hours) at 05/05/15 0807 Last data filed at 05/05/15 0600  Gross per 24 hour  Intake    899 ml  Output   2834 ml  Net  -1935 ml   Filed Weights   05/04/15 0838 05/04/15 1121 05/05/15 0700  Weight: 194 lb 3.6 oz (88.1 kg) 188 lb 4.4 oz (85.4 kg) 181 lb 1.6 oz (82.146 kg)    PHYSICAL EXAM  General: Pleasant, NAD. Neuro: Alert and oriented X 3. Moves all extremities spontaneously. Psych: Normal affect. HEENT:  Normal  Neck: Supple without bruits. Lungs:  Resp regular and unlabored. Bibasilar rale Heart: RRR no s3, s4, or murmurs. Abdomen: Soft, non-tender, non-distended, BS + x 4.  Extremities: No clubbing, cyanosis. L BKA. 1-2+ pitting edema  Accessory Clinical Findings  CBC  Recent Labs  05/03/15 0540 05/04/15 0900  WBC 7.1 11.7*  NEUTROABS  --  9.7*  HGB 10.4* 10.3*  HCT 32.4* 30.6*  MCV 80.4 86.0  PLT 269 [id number]   Basic Metabolic Panel  Recent Labs  05/03/15 0540 05/04/15 0900  NA 138 141  K 4.4 3.2*  CL 94* 98*  CO2 26 29  GLUCOSE 256* 124*  BUN 140* 92*  CREATININE 7.67* 5.12*  CALCIUM 8.1* 8.3*  PHOS 8.4* 6.0*   Liver Function Tests  Recent Labs  05/03/15 0540 05/04/15 0900  ALBUMIN 3.0* 2.9*    TELE Sinus brady with occasional wide complex brady with retrograde p waves    ECG  No new EKG  Echocardiogram 06/23/2014  LV EF: 50% -  55%  ------------------------------------------------------------------- Indications:   MI - acute 410.91.  ------------------------------------------------------------------- History:  PMH:  Coronary artery disease. Congestive heart failure. Chronic obstructive pulmonary disease. Risk factors: Hypertension. Diabetes mellitus. Dyslipidemia.  ------------------------------------------------------------------- Study Conclusions  - Left ventricle: The cavity size was normal. There was  moderate concentric hypertrophy. Systolic function was normal. The estimated ejection fraction was in the range of 50% to 55%. Wall motion was normal; there were no regional wall motion abnormalities. Doppler parameters are consistent with a reversible restrictive pattern, indicative of decreased left ventricular diastolic compliance and/or increased left atrial pressure (grade 3 diastolic dysfunction). - Aortic valve: Cusp separation was reduced. - Mitral valve: Moderately calcified annulus. - Left atrium: The atrium was mildly dilated.    Radiology/Studies  US Renal  05/01/2015  CLINICAL DATA:  Renal failure. EXAM: RENAL / URINARY TRACT ULTRASOUND COMPLETE COMPARISON:  CT, 12/05/2013 FINDINGS: Right Kidney: Length: 10.6 cm. Echogenicity within normal limits. No mass or hydronephrosis visualized. Left Kidney: Length: 10.3 cm. Echogenicity within normal limits. No mass or hydronephrosis visualized. Bladder: Decompressed with a Foley catheter IMPRESSION: Normal renal ultrasound. Electronically Signed   By: Lajean Manes M.D.   On: 05/01/2015 12:46   Dg Chest Port 1 View  05/03/2015  CLINICAL DATA:  Shortness of Breath EXAM: PORTABLE CHEST 1 VIEW COMPARISON:  04/28/2015 FINDINGS: Cardiomediastinal silhouette is stable. Dual lumen right IJ catheter with tip in right atrium. Central mild vascular congestion and mild perihilar interstitial prominence suspicious for mild interstitial edema. No segmental infiltrate. IMPRESSION: Cardiomegaly again noted. Dual lumen right IJ catheter with tip in right atrium. Central mild vascular congestion and mild perihilar interstitial prominence suspicious for mild interstitial edema. Electronically Signed   By: Lahoma Crocker M.D.   On: 05/03/2015 11:36   Dg Chest Port 1 View  04/28/2015  CLINICAL DATA:  Cough, wheezing for 2 weeks. EXAM: PORTABLE CHEST 1 VIEW COMPARISON:  04/26/2015 FINDINGS: Cardiomegaly. Decreasing lung volumes with increasing  perihilar and lower lobe airspace opacities concerning for CHF. No effusions. No acute bony abnormality. IMPRESSION: Cardiomegaly with increasing bilateral airspace disease, likely edema/ CHF. Low lung volumes. Electronically Signed   By: Rolm Baptise M.D.   On: 04/28/2015 11:11              ASSESSMENT AND PLAN  81 y.o. female with past medical history significant for diabetes mellitus, hypertension, hyperlipidemia, obesity, OSA on CPAP, COPD, coronary artery disease with also a mild cardiomyopathy EF in the 40s, PAD status post BKA as well as CKD appearing to have a creatinine of between 1.6 and 2.0 and is followed by Dr. Lorrene Reid at [facility]. Last cath 02/2015 non obstructed disease. There was mild left ventricular systolic dysfunction. Severely elevated LVEDP of 33-38 mmHg suggestive of significant diastolic heart failure.   1. Acute on chronic diastolic HF  - fluid overloaded with declining renal function requiring temp dialysis.  - despite HD yesterday 11/17, pt has increasing SOB this morning, has worsening LE edema and bibasilar rale. She likely will need additional dialysis today.  2. Junctional rhythm  - had junctional bradycardia after BB, beta blocker stopped, now in sinus brady  3. HCAP: per IM  4. Acute on chronic renal insufficiency: felt to be ATN per nephrology. Undergoing temp dialysis.  - BP stable with HD. If BP remain elevated on non-dialysis days, will consider add hydralazine.   5. NSVT: off BB due to persistent junctional rhythm  - appears to have recurrence wide complex rhythm with slow ventricular rate and retrograde p waves on telemetry since 11/16, continue to monitor, possible AIVR  6. CAD: nonobstructive on cath 03/07/2015 EF 45-50%  Signed, Almyra Deforest PA-C Pager: [phone]  The patient was seen, examined and discussed with Almyra Deforest, PA-C and I agree with the above.   The patient underwent HD yesterday. Today she feels very SOB and physical  exam shows significant fluid overload. I talked to the nephrologist Dr Justin Mend who agrees with another HD session today. Dr Justin Mend, myself and family (patient, husband and her daughter) had a conversation about future plans, the patient wants to continue aggressive medical management for now, she will undergo imaging for shunt placement and different options of post hospitalizations were discussed. Fow now they would prefer to take her home or to a short term rehab. She is also complaining about loss of appetite, I will call a dietitian for calories count and suggestions about her diet. Primary team to address if she needs swallow evaluation.   Dorothy Spark 05/05/2015

## 2015-05-05 NOTE — Progress Notes (Signed)
[facility] ROUNDING NOTE   Subjective:   Interval History:  Some cough and dyspnea this morning  Objective:  Vital signs in last 24 hours:  Temp:  [97.8 F (36.6 C)-98.4 F (36.9 C)] 98.4 F (36.9 C) (11/18 0522) Pulse Rate:  [52-84] 84 (11/18 0522) Resp:  [18] 18 (11/18 0522) BP: (147-185)/(50-94) 153/60 mmHg (11/18 0522) SpO2:  [96 %-100 %] 97 % (11/18 0522) Weight:  [82.146 kg (181 lb 1.6 oz)] 82.146 kg (181 lb 1.6 oz) (11/18 0700)  Weight change: 1.916 kg (4 lb 3.6 oz) Filed Weights   05/04/15 0838 05/04/15 1121 05/05/15 0700  Weight: 88.1 kg (194 lb 3.6 oz) 85.4 kg (188 lb 4.4 oz) 82.146 kg (181 lb 1.6 oz)    Intake/Output: I/O last 3 completed shifts: In: [id number] [P.O.:899] Out: 3784 [Urine:1775; [id number]   Intake/Output this shift:  Total I/O In: 120 [P.O.:120] Out: 250 [Urine:250]  General: Obese, AAOx3, AAF HEENT: MMM, NO LAD, Supple.  Neck: difficult to tell JVD.  Heart: Bradycardia, No MGR.  Lungs: Diffuse crackles bilaterally, appropriate rate, unlabored.  Abdomen: Obese, soft, nontender Extremities: Pitting edema to dependent areas, and L stump.  Skin: Warm and dry Neuro: AAOx3, no focal deficits.   Basic Metabolic Panel:  Recent Labs Lab 04/30/15 0233 05/01/15 0454 05/02/15 0836 05/03/15 0540 05/04/15 0900  NA 132* 136 137 138 141  K 5.9* 4.0 3.7 4.4 3.2*  CL 94* 94* 96* 94* 98*  CO2 21* 26 23 26 29   GLUCOSE 324* 232* 211* 256* 124*  BUN 87* 107* 124* 140* 92*  CREATININE 5.37* 6.49* 7.27* 7.67* 5.12*  CALCIUM 8.7* 7.9* 7.9* 8.1* 8.3*  PHOS  --   --  8.2* 8.4* 6.0*    Liver Function Tests:  Recent Labs Lab 05/02/15 0836 05/03/15 0540 05/04/15 0900  ALBUMIN 3.0* 3.0* 2.9*   No results for input(s): LIPASE, AMYLASE in the last 168 hours. No results for input(s): AMMONIA in the last 168 hours.  CBC:  Recent Labs Lab 05/02/15 0239 05/03/15 0540 05/04/15 0900  WBC 6.7 7.1 11.7*  NEUTROABS  --   --  9.7*   HGB 10.1* 10.4* 10.3*  HCT 28.7* 32.4* 30.6*  MCV 84.7 80.4 86.0  PLT 328 269 292    Cardiac Enzymes: No results for input(s): CKTOTAL, CKMB, CKMBINDEX, TROPONINI in the last 168 hours.  BNP: Invalid input(s): POCBNP  CBG:  Recent Labs Lab 05/04/15 1234 05/04/15 1658 05/04/15 2101 05/05/15 0621 05/05/15 1126  GLUCAP 95 156* 128* 122* 106*    Microbiology: Results for orders placed or performed during the hospital encounter of 04/24/15  Blood culture (routine x 2)     Status: None   Collection Time: 04/24/15  8:54 PM  Result Value Ref Range Status   Specimen Description BLOOD LEFT ANTECUBITAL  Final   Special Requests BOTTLES DRAWN AEROBIC AND ANAEROBIC 5CC   Final   Culture NO GROWTH 5 DAYS  Final   Report Status 04/29/2015 FINAL  Final  Blood culture (routine x 2)     Status: None   Collection Time: 04/24/15  8:59 PM  Result Value Ref Range Status   Specimen Description BLOOD RIGHT ANTECUBITAL  Final   Special Requests BOTTLES DRAWN AEROBIC AND ANAEROBIC 5CC   Final   Culture NO GROWTH 5 DAYS  Final   Report Status 04/29/2015 FINAL  Final  MRSA PCR Screening     Status: None   Collection Time: 04/24/15 10:51 PM  Result Value Ref  Range Status   MRSA by PCR NEGATIVE NEGATIVE Final    Comment:        The GeneXpert MRSA Assay (FDA approved for NASAL specimens only), is one component of a comprehensive MRSA colonization surveillance program. It is not intended to diagnose MRSA infection nor to guide or monitor treatment for MRSA infections.   Urine culture     Status: None   Collection Time: 04/30/15  3:38 PM  Result Value Ref Range Status   Specimen Description URINE, CATHETERIZED  Final   Special Requests NONE  Final   Culture NO GROWTH 1 DAY  Final   Report Status 05/01/2015 FINAL  Final    Coagulation Studies: No results for input(s): LABPROT, INR in the last 72 hours.  Urinalysis: No results for input(s): COLORURINE, LABSPEC, PHURINE, GLUCOSEU,  HGBUR, BILIRUBINUR, KETONESUR, PROTEINUR, UROBILINOGEN, NITRITE, LEUKOCYTESUR in the last 72 hours.  Invalid input(s): APPERANCEUR    Imaging: Dg Swallowing Func-speech Pathology  05/03/2015  Objective Swallowing Evaluation:   MBS Patient Details Name: [name] MRN: [id number] Date of Birth: 08/30/1929 Today's Date: 05/03/2015 Time: SLP Start Time (ACUTE ONLY): 1340-SLP Stop Time (ACUTE ONLY): 1400 SLP Time Calculation (min) (ACUTE ONLY): 20 min Past Medical History: Past Medical History Diagnosis Date . Uterine cancer ([place])  . Diabetes mellitus    type II; peripheral neuropathy . Hypertension  . GERD (gastroesophageal reflux disease)  . Peripheral vascular disease ([place])    a. s/p L BKA 08/2012. . Obesity  . Dyslipidemia  . Chronic combined systolic and diastolic CHF (congestive heart failure) (HCC)    a. EF 50-55%, mild LVH and grade 1 diast. Dysfxn b. Grade 3 Diastolic Dysfunction [id number]. b. 02/2015: EF 45-50% by cath. . Osteoarthritis cervical spine  . DM retinopathy ([place])  . Sleep apnea    with CPAP . History of shingles  . COPD (chronic obstructive pulmonary disease) ([place])  . Depression  . Peptic ulcer disease    duodenal . Peptic stricture of esophagus  . Hiatal hernia  . Diverticulosis  . Arthritis  . CAD (coronary artery disease)    a. s/p multiple caths with nonobs CAD;   b. cath 1/10: pLAD 20%, mLAD 40%, pCFX 20%, mCFX 40%, pRCA 60-70%;   c.  Myoview 06/02/12: Low anterior wall scar, no ischemia, EF 37%. d. cath 06/20/2014 70% mid LCx, medical therapy, high risk for PCI due to close proximity to large OM e. cath 03/07/15 pro RCA 50% & pro to mid Cx 75%, both stable, LVEDP 33-68mm Hg-> medical management . Cardiomyopathy ([place])    a. Echo 05/31/12: EF 40-45%. b. Echo 06/2014: EF 50-55%. c. Cath 02/2015: EF 45-50%. . Asthma    Mild . Pruritic condition    Idiopathic . Zoster  . Noncompliance  . CKD (chronic kidney disease), stage III    Dr. Lorrene Reid . Bradycardia    a. Not on BB due to this. .  Shortness of breath dyspnea  Past Surgical History: Past Surgical History Procedure Laterality Date . Tubal ligation  1967 . Knee arthroscopy  10/1998   Left . Craniotomy  1997   Left for SDH . Cataract extraction, bilateral  2005 . Hernia repair   . Esophagogastroduodenoscopy  04/04/2004 . Spine surgery     C-spine and lumbar surgery . Cholecystectomy  2010 . Cardiac catheterization   . Dexa  7/05 . Amputation Left 09/04/2012   Procedure: AMPUTATION BELOW KNEE;  Surgeon: Angelia Mould, MD;  Location: [place];  Service: Vascular;  Laterality: Left; . Abdominal angiogram N/A 08/31/2012   Procedure: ABDOMINAL ANGIOGRAM with runoff poss intervention;  Surgeon: Angelia Mould, MD;  Location[facility] CATH LAB;  Service: Cardiovascular;  Laterality: N/A; . Tubal ligation   . Left heart catheterization with coronary angiogram N/A 06/22/2014   Procedure: LEFT HEART CATHETERIZATION WITH CORONARY ANGIOGRAM;  Surgeon: Burnell Blanks, MD;  Location: [facility] CATH LAB;  Service: Cardiovascular;  Laterality: N/A; . Eye surgery   . Brain surgery     1997 blood clot on the brain, then had to relieve fluid on the brain . Multiple extractions with alveoloplasty N/A 08/01/2014   Procedure: EXTRACTIONS OF TEETH NUMBERS 7 8 9 10 11  AND 19 AND ALVEOLOPLASTY UPPER LEFT AND RIGHT QUADRANT;  Surgeon: Isac Caddy, DDS;  Location: [place];  Service: Oral Surgery;  Laterality: N/A; . Cardiac catheterization N/A 03/07/2015   Procedure: Left Heart Cath and Coronary Angiography;  Surgeon: Leonie Man, MD;  Location: [place] CV LAB;  Service: Cardiovascular;  Laterality: N/A; HPI: [name] is an 81 y.o. female patient with history of chronic combined CHF (EF 45-50 percent & grade 3 diastolic dysfunction by [facility] 02/2015), CAD, DM 2, HTN, HLD, OSA on nightly CPAP, PAD status post left BKA, stage III CKD, COPD/? Asthma, bradycardia (hence not on beta blockers), recent hospitalization 9/19-9/22 for unstable angina at which  time cath showed stable anatomy, now presented to [facility] ED on 04/24/15 with worsening dyspnea, orthopnea, productive cough, pleuritic appearing chest pain but no fevers. Recently seen by PCP and started on azithromycin for suspected pneumonia & Lasix dose increased. Noncompliant with fluid and water restriction. Admitted for decompensated CHF & question of pneumonia. Per MD notes, on 11/11 patient began to c/o coughing while eating. MBS recommended after swallow session this am.  No Data Recorded Assessment / Plan / Recommendation CHL IP CLINICAL IMPRESSIONS 05/03/2015 Therapy Diagnosis Mild pharyngeal phase dysphagia Clinical Impression Mild pharyngeal dysphagia as evidenced by delayed swallow initiation to pyriform sinuses due to decreased pharyngeal sensation. Mild pyriform sinus residue due to decreased laryngeal elevation cleared with verbal cues for second swallow. Pt coughed during MBS without barium observed in laryngeal vestibule during study. Recommend pt continue regular texture diet, thin liquids, straws allowed, small sips and bites, pills with thin (one at a time). Follow up x 1 for education.     CHL IP TREATMENT RECOMMENDATION 05/03/2015 Treatment Recommendations Therapy as outlined in treatment plan below   CHL IP DIET RECOMMENDATION 05/03/2015 SLP Diet Recommendations Regular solids;Thin liquid Liquid Administration via Cup;Straw Medication Administration Whole meds with liquid Compensations Slow rate;Small sips/bites;Multiple dry swallows after each bite/sip Postural Changes Seated upright at 90 degrees   CHL IP OTHER RECOMMENDATIONS 05/03/2015 Recommended Consults -- Oral Care Recommendations Oral care BID Other Recommendations --   CHL IP FOLLOW UP RECOMMENDATIONS 05/03/2015 Follow up Recommendations None   CHL IP FREQUENCY AND DURATION 05/03/2015 Speech Therapy Frequency (ACUTE ONLY) min 1 x/week Treatment Duration 1 week      CHL IP ORAL PHASE 05/03/2015 Oral Phase WFL Oral - Pudding Teaspoon --  Oral - Pudding Cup -- Oral - Honey Teaspoon -- Oral - Honey Cup -- Oral - Nectar Teaspoon -- Oral - Nectar Cup -- Oral - Nectar Straw -- Oral - Thin Teaspoon -- Oral - Thin Cup -- Oral - Thin Straw -- Oral - Puree -- Oral - Mech Soft -- Oral - Regular -- Oral - Multi-Consistency -- Oral - Pill -- Oral Phase -  Comment --  CHL IP PHARYNGEAL PHASE 05/03/2015 Pharyngeal Phase Thin;Solids Pharyngeal- Pudding Teaspoon -- Pharyngeal -- Pharyngeal- Pudding Cup -- Pharyngeal -- Pharyngeal- Honey Teaspoon -- Pharyngeal -- Pharyngeal- Honey Cup -- Pharyngeal -- Pharyngeal- Nectar Teaspoon -- Pharyngeal -- Pharyngeal- Nectar Cup -- Pharyngeal -- Pharyngeal- Nectar Straw -- Pharyngeal -- Pharyngeal- Thin Teaspoon NT Pharyngeal -- Pharyngeal- Thin Cup Delayed swallow initiation-pyriform sinuses;Pharyngeal residue - pyriform;Reduced laryngeal elevation Pharyngeal -- Pharyngeal- Thin Straw Delayed swallow initiation-pyriform sinuses;Pharyngeal residue - pyriform;Reduced laryngeal elevation Pharyngeal -- Pharyngeal- Puree Delayed swallow initiation-vallecula;Pharyngeal residue - pyriform;Reduced laryngeal elevation Pharyngeal -- Pharyngeal- Mechanical Soft NT Pharyngeal -- Pharyngeal- Regular Delayed swallow initiation-vallecula Pharyngeal -- Pharyngeal- Multi-consistency NT Pharyngeal -- Pharyngeal- Pill Delayed swallow initiation-vallecula Pharyngeal -- Pharyngeal Comment --  CHL IP CERVICAL ESOPHAGEAL PHASE 05/03/2015 Cervical Esophageal Phase WFL Pudding Teaspoon -- Pudding Cup -- Honey Teaspoon -- Honey Cup -- Nectar Teaspoon -- Nectar Cup -- Nectar Straw -- Thin Teaspoon -- Thin Cup -- Thin Straw -- Puree -- Mechanical Soft -- Regular -- Multi-consistency -- Pill -- Cervical Esophageal Comment -- [place] Siren 05/03/2015, 4:07 PM  Orbie Pyo Colvin Caroli.Ed CCC-SLP Pager [phone]               Medications:     . allopurinol  100 mg Oral Daily  . aspirin EC  81 mg Oral Daily  . atorvastatin  40 mg Oral q1800  .  calcitRIOL  0.25 mcg Oral Daily  . enoxaparin (LOVENOX) injection  30 mg Subcutaneous QHS  . ezetimibe  10 mg Oral q morning - 10a  . feeding supplement (GLUCERNA SHAKE)  237 mL Oral BID PC  . furosemide  40 mg Intravenous BID  . gabapentin  300 mg Oral QHS  . insulin aspart  0-15 Units Subcutaneous TID WC  . insulin aspart  0-5 Units Subcutaneous QHS  . insulin aspart  3 Units Subcutaneous TID WC  . insulin detemir  16 Units Subcutaneous QHS  . isosorbide mononitrate  60 mg Oral Daily  . latanoprost  1 drop Both Eyes QHS  . mometasone-formoterol  2 puff Inhalation BID  . multivitamin with minerals  1 tablet Oral Daily  . pantoprazole  40 mg Oral BID  . ranolazine  500 mg Oral BID  . sertraline  100 mg Oral Daily  . sodium chloride  3 mL Intravenous Q12H   sodium chloride, sodium chloride, acetaminophen **OR** acetaminophen, albuterol, alteplase, guaiFENesin, heparin, HYDROcodone-acetaminophen, lidocaine (PF), lidocaine-prilocaine, menthol-cetylpyridinium, nitroGLYCERIN, ondansetron **OR** ondansetron (ZOFRAN) IV, pentafluoroprop-tetrafluoroeth  Assessment/ Plan:  Admitted with worsening edema and shortness of breath. Cardiac catheterization in September 2016. Treated with zithromycin for pneumonia. Did receive vancomycin now stopped. Blood pressure variability but not in shock.  This is puzzling but probably acute tubular necrosis.    1.Renal- acute on chronic renal failure since November 9. Cannot identify any specific nephrotoxins. As above, her blood pressure has been in the 110's she has been bradycardic. Possibly ATN from hypoperfusion? All blood pressure medications have been stopped. Also in the differential would be interstitial nephritis from antibiotics. Vanc related nephrotoxicity seems unlikely because the level is not high. This is a difficult situation because now her kidney numbers are quite abnormal. Likely due to poor cardiac output. Pt. Is volume overloaded. Renal  ultrasound essentially normal. Poor urine output since yesterday.  - Cardiorenal syndrome more likely, with FENA of 0.8%. - Renal U/S normal.  - Renal panel pending this am.  - Began dialysis ( treatment was 11/16 )  -  Dialysis catheter placement 11/16 - Probable dialysis today.  - Continue lasix. 40mg  BID.   2. Hypertension/volume - BP's now on the lower side. she seems volume overloaded. Continue lasix. Started on dialysis 11/16 for fluid removal No antihypertensives  We shall continue dialysis now 3. Hyperkalemia- Resolved now 4. Anemia - hemoglobin around 10. No action at this time 5. Discussed with daughter [name] and husband  -- we shall continue dialysis and will proceed with CLIP process and consult VVS for permanent access   LOS: 11 [place] W @TODAY @11 :48 AM

## 2015-05-05 NOTE — Consult Note (Signed)
[facility] [place]  Patient name: [name] MRN: [id number] DOB: 12-05-1929 Sex: female  REASON FOR CONSULT: permanent dialysis access CONSULTING PHYSICIAN: Dr. Justin Mend  HPI: [name] is a 81 y.o. female with CKD stage III, who we've been consulted on regarding permanent dialysis access. She has never had access procedures before. The patient is right handed. She had a tunneled dialysis catheter placed on 11/16 and underwent first dialysis that day for fluid removal. She has not been declared ESRD yet. The patient presented to the hospital on 04/24/15 with shortness of breath and productive cough. She was started antibiotics for possible HCAP.  Her CXR today reveals no infiltrates consistent with pneumonia.   She continues to have some dyspnea today.   The patient has a past medical history  CAD, diabetes mellitus II, chronic systolic and diastolic heart failure, OSA, PVD s/p left BKA in 2014 by Dr. Scot Dock.   Past Medical History  Diagnosis Date  . Uterine cancer ([place])   . Diabetes mellitus     type II; peripheral neuropathy  . Hypertension   . GERD (gastroesophageal reflux disease)   . Peripheral vascular disease ([place])     a. s/p L BKA 08/2012.  . Obesity   . Dyslipidemia   . Chronic combined systolic and diastolic CHF (congestive heart failure) ([facility])     a. EF 50-55%, mild LVH and grade 1 diast. Dysfxn b. Grade 3 Diastolic Dysfunction [id number]. b. 02/2015: EF 45-50% by cath.  . Osteoarthritis cervical spine   . DM retinopathy ([place])   . Sleep apnea     with CPAP  . History of shingles   . COPD (chronic obstructive pulmonary disease) ([place])   . Depression   . Peptic ulcer disease     duodenal  . Peptic stricture of esophagus   . Hiatal hernia   . Diverticulosis   . Arthritis   . CAD (coronary artery disease)     a. s/p multiple caths with nonobs CAD;   b. cath 1/10: pLAD 20%, mLAD 40%, pCFX 20%, mCFX 40%, pRCA 60-70%;   c.  Myoview  06/02/12: Low anterior wall scar, no ischemia, EF 37%. d. cath 06/20/2014 70% mid LCx, medical therapy, high risk for PCI due to close proximity to large OM e. cath 03/07/15 pro RCA 50% & pro to mid Cx 75%, both stable, LVEDP 33-18mm Hg-> medical management  . Cardiomyopathy ([place])     a. Echo 05/31/12: EF 40-45%. b. Echo 06/2014: EF 50-55%. c. Cath 02/2015: EF 45-50%.  . Asthma     Mild  . Pruritic condition     Idiopathic  . Zoster   . Noncompliance   . CKD (chronic kidney disease), stage III     Dr. Lorrene Reid  . Bradycardia     a. Not on BB due to this.  . Shortness of breath dyspnea     Family History  Problem Relation Age of Onset  . Cancer - Other Mother     "Stomach" Cancer  . Diabetes Mother   . Heart disease Mother   . Stomach cancer Mother   . Hypertension Mother   . Lymphoma Father   . Hypertension Father   . Kidney disease Paternal Grandmother   . Asthma Other   . Diabetes Sister   . Rheum arthritis Mother   . Rheum arthritis Father   . Heart attack Neg Hx   . Stroke Paternal Grandmother     SOCIAL HISTORY:  Social History   Social History  . Marital Status: Married    Spouse Name: N/A  . Number of Children: 7  . Years of Education: N/A   Occupational History  . Retired    Social History Main Topics  . Smoking status: Never Smoker   . Smokeless tobacco: Never Used  . Alcohol Use: No     Comment: rare  . Drug Use: No  . Sexual Activity: No   Other Topics Concern  . Not on file   Social History Narrative    Allergies  Allergen Reactions  . Iohexol Itching and Rash     Code: RASH, Desc: [place] ON PT'S CHART ALLERGIC TO IV DYE 09/04/07/RM, Onset Date: [id number]     Current Facility-Administered Medications  Medication Dose Route Frequency Provider Last Rate Last Dose  . 0.9 %  sodium chloride infusion  100 mL Intravenous PRN Edrick Oh, MD      . 0.9 %  sodium chloride infusion  100 mL Intravenous PRN Edrick Oh, MD      . acetaminophen  (TYLENOL) tablet 650 mg  650 mg Oral Q6H PRN Rise Patience, MD       Or  . acetaminophen (TYLENOL) suppository 650 mg  650 mg Rectal Q6H PRN Rise Patience, MD      . albuterol (PROVENTIL) (2.5 MG/3ML) 0.083% nebulizer solution 2.5 mg  2.5 mg Nebulization Q2H PRN Rise Patience, MD      . allopurinol (ZYLOPRIM) tablet 100 mg  100 mg Oral Daily Rise Patience, MD   100 mg at 05/05/15 1003  . alteplase (CATHFLO ACTIVASE) injection 2 mg  2 mg Intracatheter Once PRN Edrick Oh, MD      . aspirin EC tablet 81 mg  81 mg Oral Daily Rise Patience, MD   81 mg at 05/05/15 1003  . atorvastatin (LIPITOR) tablet 40 mg  40 mg Oral q1800 Rise Patience, MD   40 mg at 05/04/15 1727  . calcitRIOL (ROCALTROL) capsule 0.25 mcg  0.25 mcg Oral Daily Rise Patience, MD   0.25 mcg at 05/05/15 1003  . enoxaparin (LOVENOX) injection 30 mg  30 mg Subcutaneous QHS Monia Sabal, PA-C   30 mg at 05/04/15 2144  . ezetimibe (ZETIA) tablet 10 mg  10 mg Oral q morning - 10a Rise Patience, MD   10 mg at 05/05/15 1003  . feeding supplement (GLUCERNA SHAKE) (GLUCERNA SHAKE) liquid 237 mL  237 mL Oral BID PC Reanne J Barbato, RD   237 mL at 05/05/15 0846  . furosemide (LASIX) injection 40 mg  40 mg Intravenous BID Debbe Odea, MD   40 mg at 05/05/15 0847  . gabapentin (NEURONTIN) capsule 300 mg  300 mg Oral QHS Rise Patience, MD   300 mg at 05/04/15 2143  . guaiFENesin (ROBITUSSIN) 100 MG/5ML solution 100 mg  5 mL Oral Q6H PRN Rise Patience, MD   100 mg at 05/05/15 0651  . heparin injection 1,000 Units  1,000 Units Dialysis PRN Edrick Oh, MD      . HYDROcodone-acetaminophen [facility]) 10-325 MG per tablet 1 tablet  1 tablet Oral Q6H PRN Rise Patience, MD   1 tablet at 04/26/15 2238  . insulin aspart (novoLOG) injection 0-15 Units  0-15 Units Subcutaneous TID WC Charlynne Cousins, MD   2 Units at 05/05/15 [phone]  . insulin aspart (novoLOG) injection 0-5 Units  0-5 Units  Subcutaneous QHS Charlynne Cousins,  MD   2 Units at 05/01/15 2230  . insulin aspart (novoLOG) injection 3 Units  3 Units Subcutaneous TID WC Debbe Odea, MD   3 Units at 05/04/15 0748  . insulin detemir (LEVEMIR) injection 16 Units  16 Units Subcutaneous QHS Debbe Odea, MD   16 Units at 05/04/15 2144  . isosorbide mononitrate (IMDUR) 24 hr tablet 60 mg  60 mg Oral Daily Rise Patience, MD   60 mg at 05/05/15 1004  . latanoprost (XALATAN) 0.005 % ophthalmic solution 1 drop  1 drop Both Eyes QHS Rise Patience, MD   1 drop at 05/04/15 2145  . lidocaine (PF) (XYLOCAINE) 1 % injection 5 mL  5 mL Intradermal PRN Edrick Oh, MD      . lidocaine-prilocaine (EMLA) cream 1 application  1 application Topical PRN Edrick Oh, MD      . menthol-cetylpyridinium (CEPACOL) lozenge 3 mg  1 lozenge Oral PRN Jeryl Columbia, NP   3 mg at 04/26/15 2239  . mometasone-formoterol (DULERA) 100-5 MCG/ACT inhaler 2 puff  2 puff Inhalation BID Rise Patience, MD   2 puff at 05/05/15 [phone]  . multivitamin with minerals tablet 1 tablet  1 tablet Oral Daily Lorenda Peck, RD   1 tablet at 05/05/15 1003  . nitroGLYCERIN (NITROSTAT) SL tablet 0.4 mg  0.4 mg Sublingual Q5 min PRN Rise Patience, MD      . ondansetron [facility]) tablet 4 mg  4 mg Oral Q6H PRN Rise Patience, MD       Or  . ondansetron [facility]) injection 4 mg  4 mg Intravenous Q6H PRN Rise Patience, MD      . pantoprazole (PROTONIX) EC tablet 40 mg  40 mg Oral BID Rise Patience, MD   40 mg at 05/05/15 1003  . pentafluoroprop-tetrafluoroeth (GEBAUERS) aerosol 1 application  1 application Topical PRN Edrick Oh, MD      . ranolazine (RANEXA) 12 hr tablet 500 mg  500 mg Oral BID Rise Patience, MD   500 mg at 05/05/15 1003  . sertraline (ZOLOFT) tablet 100 mg  100 mg Oral Daily Rise Patience, MD   100 mg at 05/05/15 1003  . sodium chloride 0.9 % injection 3 mL  3 mL Intravenous Q12H Rise Patience, MD   3  mL at 05/05/15 1004    REVIEW OF SYSTEMS:  [X]  denotes positive finding, [ ]  denotes negative finding Cardiac  Comments:  Chest pain or chest pressure: x Discomfort with coughing  Shortness of breath upon exertion: x   Short of breath when lying flat:    Irregular heart rhythm:        Vascular    Pain in calf, thigh, or hip brought on by ambulation:    Pain in feet at night that wakes you up from your sleep:     Blood clot in your veins:    Leg swelling:         Pulmonary    Oxygen at home:    Productive cough:  x   Wheezing:         Neurologic    Sudden weakness in arms or legs:     Sudden numbness in arms or legs:     Sudden onset of difficulty speaking or slurred speech:    Temporary loss of vision in one eye:     Problems with dizziness:         Gastrointestinal  Blood in stool:     Vomited blood:         Genitourinary    Burning when urinating:     Blood in urine:        Psychiatric    Major depression:         Hematologic    Bleeding problems:    Problems with blood clotting too easily:        Skin    Rashes or ulcers:        Constitutional    Fever or chills:      PHYSICAL EXAM: Filed Vitals:   05/04/15 2003 05/04/15 2106 05/05/15 0522 05/05/15 0700  BP:  147/50 153/60   Pulse:  60 84   Temp:  97.9 F (36.6 C) 98.4 F (36.9 C)   TempSrc:  Oral Oral   Resp:  18 18   Height:      Weight:    181 lb 1.6 oz (82.146 kg)  SpO2: 98% 99% 97%     GENERAL: The patient is a well-nourished female, in no acute distress. The vital signs are documented above. CARDIAC: There is a regular rate and rhythm. No carotid bruits.  VASCULAR: 2+ radial pulses and brachial pulses bilaterally. Left BKA well healed. Right foot warm with palpable dorsalis pedis pulse. Right IJ TDC. PULMONARY: Non labored breathing. Diffuse crackles.  MUSCULOSKELETAL: There are no major deformities or cyanosis. NEUROLOGIC: No focal weakness or paresthesias are detected. SKIN: There  are no ulcers or rashes noted. PSYCHIATRIC: The patient has a normal affect.  DATA:  Vein mapping ordered.   MEDICAL ISSUES:  Acute on chronic kidney disease:   Continuing to have dyspnea this am. Being diuresed. Dialysis started on 11/16 via right IJ tunneled dialysis catheter for fluid removal. Has not been declared ESRD yet.  For HD today.   The patient is right handed. Vein mapping ordered. Family to discuss moving forward with access placement. Discussed that this may be placed while she is inpatient or arranged as an outpatient. Dr. Scot Dock to evaluate later today.    [place] [facility] [place]: [phone]  Agree with above. Vein map is pending. Palpable brachial and radial pulses bilaterally. She is scheduled for a left arm AV fistula or AV graft on Wednesday, 05/10/2015.  Deitra Mayo, MD, [place] [phone] Office: [phone]

## 2015-05-05 NOTE — Progress Notes (Signed)
Nutrition Brief Note  RD consulted for assessment of nutrition status/requirements due to poor appetite. Nutrition assessment done on 05/04/15. Pt reports poor PO d/t dry mouth. Spoke with Dr. Wynelle Cleveland 11/17; in agreement to provide Biotene mouthwash. Glucerna Shakes were increased to BID and snacks have been added for patient. See nutrition note from 11/17 for additional detaills. RD will continue to monitor.   Scarlette Ar RD, LDN Inpatient Clinical Dietitian Pager: [phone] After Hours Pager: [phone]

## 2015-05-05 NOTE — Progress Notes (Signed)
[facility] Progress Note   [name]  [id number]  DOB: [birth year]  DOA: 04/24/2015 PCP: Renato Shin, MD  Brief narrative: [name] is a 81 y.o. female  point chronic kidney disease stage III, coronary artery disease, chronic systolic and diastolic heart failure, obstructive sleep apnea, peripheral vascular disease status post left BKA, type 2 diabetes mellitus on insulin presents to the hospital for shortness of breath and cough and is found to have pulmonary edema. She was recently treated with Zithromax and increased dose of Lasix by her PCP however continued to be short of breath at rest. She was started on diuretics after admission however creatinine rose steadily without much results and diuresis. Nephrology consult was requested and it was decided for her to undergo dialysis to help remove excess fluid.   Subjective: More short of breath this AM. No complaints of pain. Cough continues.   Assessment/Plan: Principal Problem:   Acute respiratory failure with hypoxia -  Acute on chronic diastolic heart failure- AKI - significant rise in creatinine with diuresis - dialysis started on 11/15  - appreciate cardiology and nephrology assistance   Active Problems: HCAP? - has had a cough with congestion for 2 wks now - CXR obtained today after fluid has been dialyzed off does not reveal infiltrates consistent with pneumonia - initially received Vancomycin and Ceftaz and then transitioned to Levaquin- cough unfortunately not improving with antibiotics- today is day 14- will d/c Levaquin  - cough likely related to CHF which we are attempting to treat - of note the patient never had leukocytosis or feves - SLP eval- D 3 diet with thin liquids  Junctional rhythm/ NSVT - junctional rhythm resolved with holding B Blocker - management per cardiology   DM (diabetes mellitus), type 2, uncontrolled, with renal complications   - uses 0000000 insulin at home- 14  U only at breakfast - has been started on Levemir and Novolog in the hospital - she was hypoglycemic on 11/17- Levemir dosage adjusted- sugars improved  Hyperlipidemia -cont statin + zetia   Coronary artery disease due to lipid rich plaque; 70% ostial-proximal AV Groove Cx - not good PCI target -cont Imdur, Ranexa, statin and Aspirin  Anemia of chronic disease - stable     Code Status:     Code Status Orders        Start     Ordered   04/24/15 2353  Full code   Continuous     04/24/15 2354     Family Communication:  Disposition Plan: SNF recommended by PT DVT prophylaxis: heparin Consultants:cardiology, nephrology Procedures:   Antibiotics: Anti-infectives    Start     Dose/Rate Route Frequency Ordered Stop   05/02/15 1000  levofloxacin (LEVAQUIN) IVPB 500 mg     500 mg 100 mL/hr over 60 Minutes Intravenous Every 48 hours 04/30/15 0940     04/30/15 1200  vancomycin (VANCOCIN) IVPB 1000 mg/200 mL premix  Status:  Discontinued     1,000 mg 200 mL/hr over 60 Minutes Intravenous Every 48 hours 04/28/15 1254 04/30/15 0918   04/30/15 1100  levofloxacin (LEVAQUIN) IVPB 500 mg  Status:  Discontinued     500 mg 100 mL/hr over 60 Minutes Intravenous Every 48 hours 04/28/15 1052 04/29/15 0956   04/30/15 1000  levofloxacin (LEVAQUIN) IVPB 750 mg     750 mg 100 mL/hr over 90 Minutes Intravenous  Once 04/30/15 0944 04/30/15 1206   04/28/15 1100  levofloxacin (LEVAQUIN) IVPB 750 mg  750 mg 100 mL/hr over 90 Minutes Intravenous  Once 04/28/15 1034 04/28/15 1830   04/28/15 1100  cefTAZidime (FORTAZ) 2 g in dextrose 5 % 50 mL IVPB  Status:  Discontinued     2 g 100 mL/hr over 30 Minutes Intravenous Every 24 hours 04/28/15 1037 04/30/15 0940   04/28/15 1100  vancomycin (VANCOCIN) IVPB 1000 mg/200 mL premix     1,000 mg 200 mL/hr over 60 Minutes Intravenous  Once 04/28/15 1048 04/28/15 1408   04/25/15 2100  vancomycin (VANCOCIN) IVPB 1000 mg/200 mL premix  Status:   Discontinued     1,000 mg 200 mL/hr over 60 Minutes Intravenous Every 24 hours 04/24/15 2032 04/26/15 1302   04/25/15 2030  cefTAZidime (FORTAZ) 2 g in dextrose 5 % 50 mL IVPB  Status:  Discontinued     2 g 100 mL/hr over 30 Minutes Intravenous Every 24 hours 04/24/15 2032 04/26/15 1302   04/25/15 0000  cefTAZidime (FORTAZ) 2 g in dextrose 5 % 50 mL IVPB  Status:  Discontinued     2 g 100 mL/hr over 30 Minutes Intravenous 3 times per day 04/24/15 2354 04/25/15 0018   04/24/15 2030  cefTAZidime (FORTAZ) 2 g in dextrose 5 % 50 mL IVPB  Status:  Discontinued     2 g 100 mL/hr over 30 Minutes Intravenous  Once 04/24/15 2025 04/24/15 2032   04/24/15 2030  vancomycin (VANCOCIN) IVPB 1000 mg/200 mL premix  Status:  Discontinued     1,000 mg 200 mL/hr over 60 Minutes Intravenous  Once 04/24/15 2025 04/24/15 2032      Objective: Filed Weights   05/04/15 0838 05/04/15 1121 05/05/15 0700  Weight: 88.1 kg (194 lb 3.6 oz) 85.4 kg (188 lb 4.4 oz) 82.146 kg (181 lb 1.6 oz)    Intake/Output Summary (Last 24 hours) at 05/05/15 1019 Last data filed at 05/05/15 0939  Gross per 24 hour  Intake   1019 ml  Output   3084 ml  Net  -2065 ml     Vitals Filed Vitals:   05/04/15 2003 05/04/15 2106 05/05/15 0522 05/05/15 0700  BP:  147/50 153/60   Pulse:  60 84   Temp:  97.9 F (36.6 C) 98.4 F (36.9 C)   TempSrc:  Oral Oral   Resp:  18 18   Height:      Weight:    82.146 kg (181 lb 1.6 oz)  SpO2: 98% 99% 97%     Exam:  General:  Pt is alert, not in acute distress  HEENT: No icterus, No thrush, oral mucosa moist  Cardiovascular: regular rate and rhythm, S1/S2 No murmur  Respiratory: crackles at bases  Abdomen: Soft, +Bowel sounds, non tender, non distended, no guarding  MSK: No LE edema, cyanosis or clubbing  Data Reviewed: Basic Metabolic Panel:  Recent Labs Lab 04/30/15 0233 05/01/15 0454 05/02/15 0836 05/03/15 0540 05/04/15 0900  NA 132* 136 137 138 141  K 5.9* 4.0 3.7  4.4 3.2*  CL 94* 94* 96* 94* 98*  CO2 21* 26 23 26 29   GLUCOSE 324* 232* 211* 256* 124*  BUN 87* 107* 124* 140* 92*  CREATININE 5.37* 6.49* 7.27* 7.67* 5.12*  CALCIUM 8.7* 7.9* 7.9* 8.1* 8.3*  PHOS  --   --  8.2* 8.4* 6.0*   Liver Function Tests:  Recent Labs Lab 05/02/15 0836 05/03/15 0540 05/04/15 0900  ALBUMIN 3.0* 3.0* 2.9*   No results for input(s): LIPASE, AMYLASE in the last 168 hours. No results  for input(s): AMMONIA in the last 168 hours. CBC:  Recent Labs Lab 05/02/15 0239 05/03/15 0540 05/04/15 0900  WBC 6.7 7.1 11.7*  NEUTROABS  --   --  9.7*  HGB 10.1* 10.4* 10.3*  HCT 28.7* 32.4* 30.6*  MCV 84.7 80.4 86.0  PLT 328 269 292   Cardiac Enzymes: No results for input(s): CKTOTAL, CKMB, CKMBINDEX, TROPONINI in the last 168 hours. BNP (last 3 results)  Recent Labs  06/17/14 1330 06/17/14 1946 04/24/15 1738  BNP 1951.5* 1985.7* 1594.7*    ProBNP (last 3 results)  Recent Labs  06/08/14 1617  PROBNP 1249.0*    CBG:  Recent Labs Lab 05/04/15 0806 05/04/15 1234 05/04/15 1658 05/04/15 2101 05/05/15 0621  GLUCAP 104* 95 156* 128* 122*    Recent Results (from the past 240 hour(s))  Urine culture     Status: None   Collection Time: 04/30/15  3:38 PM  Result Value Ref Range Status   Specimen Description URINE, CATHETERIZED  Final   Special Requests NONE  Final   Culture NO GROWTH 1 DAY  Final   Report Status 05/01/2015 FINAL  Final     Studies: Dg Swallowing Func-speech Pathology  05/03/2015  Objective Swallowing Evaluation:   MBS Patient Details Name: [name] MRN: [id number] Date of Birth: 09-24-1929 Today's Date: 05/03/2015 Time: SLP Start Time (ACUTE ONLY): 1340-SLP Stop Time (ACUTE ONLY): 1400 SLP Time Calculation (min) (ACUTE ONLY): 20 min Past Medical History: Past Medical History Diagnosis Date . Uterine cancer ([place])  . Diabetes mellitus    type II; peripheral neuropathy . Hypertension  . GERD (gastroesophageal reflux  disease)  . Peripheral vascular disease ([place])    a. s/p L BKA 08/2012. . Obesity  . Dyslipidemia  . Chronic combined systolic and diastolic CHF (congestive heart failure) ([facility])    a. EF 50-55%, mild LVH and grade 1 diast. Dysfxn b. Grade 3 Diastolic Dysfunction [id number]. b. 02/2015: EF 45-50% by cath. . Osteoarthritis cervical spine  . DM retinopathy ([place])  . Sleep apnea    with CPAP . History of shingles  . COPD (chronic obstructive pulmonary disease) ([place])  . Depression  . Peptic ulcer disease    duodenal . Peptic stricture of esophagus  . Hiatal hernia  . Diverticulosis  . Arthritis  . CAD (coronary artery disease)    a. s/p multiple caths with nonobs CAD;   b. cath 1/10: pLAD 20%, mLAD 40%, pCFX 20%, mCFX 40%, pRCA 60-70%;   c.  Myoview 06/02/12: Low anterior wall scar, no ischemia, EF 37%. d. cath 06/20/2014 70% mid LCx, medical therapy, high risk for PCI due to close proximity to large OM e. cath 03/07/15 pro RCA 50% & pro to mid Cx 75%, both stable, LVEDP 33-36mm Hg-> medical management . Cardiomyopathy ([place])    a. Echo 05/31/12: EF 40-45%. b. Echo 06/2014: EF 50-55%. c. Cath 02/2015: EF 45-50%. . Asthma    Mild . Pruritic condition    Idiopathic . Zoster  . Noncompliance  . CKD (chronic kidney disease), stage III    Dr. Lorrene Reid . Bradycardia    a. Not on BB due to this. . Shortness of breath dyspnea  Past Surgical History: Past Surgical History Procedure Laterality Date . Tubal ligation  1967 . Knee arthroscopy  10/1998   Left . Craniotomy  1997   Left for SDH . Cataract extraction, bilateral  2005 . Hernia repair   . Esophagogastroduodenoscopy  04/04/2004 . Spine surgery     C-spine  and lumbar surgery . Cholecystectomy  2010 . Cardiac catheterization   . Dexa  7/05 . Amputation Left 09/04/2012   Procedure: AMPUTATION BELOW KNEE;  Surgeon: Angelia Mould, MD;  Location: [facility];  Service: Vascular;  Laterality: Left; . Abdominal angiogram N/A 08/31/2012   Procedure: ABDOMINAL ANGIOGRAM with runoff poss  intervention;  Surgeon: Angelia Mould, MD;  Location[facility] CATH LAB;  Service: Cardiovascular;  Laterality: N/A; . Tubal ligation   . Left heart catheterization with coronary angiogram N/A 06/22/2014   Procedure: LEFT HEART CATHETERIZATION WITH CORONARY ANGIOGRAM;  Surgeon: Burnell Blanks, MD;  Location: [facility] CATH LAB;  Service: Cardiovascular;  Laterality: N/A; . Eye surgery   . Brain surgery     1997 blood clot on the brain, then had to relieve fluid on the brain . Multiple extractions with alveoloplasty N/A 08/01/2014   Procedure: EXTRACTIONS OF TEETH NUMBERS 7 8 9 10 11  AND 19 AND ALVEOLOPLASTY UPPER LEFT AND RIGHT QUADRANT;  Surgeon: Isac Caddy, DDS;  Location: [facility];  Service: Oral Surgery;  Laterality: N/A; . Cardiac catheterization N/A 03/07/2015   Procedure: Left Heart Cath and Coronary Angiography;  Surgeon: Leonie Man, MD;  Location: [facility] CV LAB;  Service: Cardiovascular;  Laterality: N/A; HPI: [name] is an 81 y.o. female patient with history of chronic combined CHF (EF 45-50 percent & grade 3 diastolic dysfunction by [facility] 02/2015), CAD, DM 2, HTN, HLD, OSA on nightly CPAP, PAD status post left BKA, stage III CKD, COPD/? Asthma, bradycardia (hence not on beta blockers), recent hospitalization 9/19-9/22 for unstable angina at which time cath showed stable anatomy, now presented to [facility] ED on 04/24/15 with worsening dyspnea, orthopnea, productive cough, pleuritic appearing chest pain but no fevers. Recently seen by PCP and started on azithromycin for suspected pneumonia & Lasix dose increased. Noncompliant with fluid and water restriction. Admitted for decompensated CHF & question of pneumonia. Per MD notes, on 11/11 patient began to c/o coughing while eating. MBS recommended after swallow session this am.  No Data Recorded Assessment / Plan / Recommendation CHL IP CLINICAL IMPRESSIONS 05/03/2015 Therapy Diagnosis Mild pharyngeal phase dysphagia Clinical Impression  Mild pharyngeal dysphagia as evidenced by delayed swallow initiation to pyriform sinuses due to decreased pharyngeal sensation. Mild pyriform sinus residue due to decreased laryngeal elevation cleared with verbal cues for second swallow. Pt coughed during MBS without barium observed in laryngeal vestibule during study. Recommend pt continue regular texture diet, thin liquids, straws allowed, small sips and bites, pills with thin (one at a time). Follow up x 1 for education.     CHL IP TREATMENT RECOMMENDATION 05/03/2015 Treatment Recommendations Therapy as outlined in treatment plan below   CHL IP DIET RECOMMENDATION 05/03/2015 SLP Diet Recommendations Regular solids;Thin liquid Liquid Administration via Cup;Straw Medication Administration Whole meds with liquid Compensations Slow rate;Small sips/bites;Multiple dry swallows after each bite/sip Postural Changes Seated upright at 90 degrees   CHL IP OTHER RECOMMENDATIONS 05/03/2015 Recommended Consults -- Oral Care Recommendations Oral care BID Other Recommendations --   CHL IP FOLLOW UP RECOMMENDATIONS 05/03/2015 Follow up Recommendations None   CHL IP FREQUENCY AND DURATION 05/03/2015 Speech Therapy Frequency (ACUTE ONLY) min 1 x/week Treatment Duration 1 week      CHL IP ORAL PHASE 05/03/2015 Oral Phase WFL Oral - Pudding Teaspoon -- Oral - Pudding Cup -- Oral - Honey Teaspoon -- Oral - Honey Cup -- Oral - Nectar Teaspoon -- Oral - Nectar Cup -- Oral - Nectar Straw --  Oral - Thin Teaspoon -- Oral - Thin Cup -- Oral - Thin Straw -- Oral - Puree -- Oral - Mech Soft -- Oral - Regular -- Oral - Multi-Consistency -- Oral - Pill -- Oral Phase - Comment --  CHL IP PHARYNGEAL PHASE 05/03/2015 Pharyngeal Phase Thin;Solids Pharyngeal- Pudding Teaspoon -- Pharyngeal -- Pharyngeal- Pudding Cup -- Pharyngeal -- Pharyngeal- Honey Teaspoon -- Pharyngeal -- Pharyngeal- Honey Cup -- Pharyngeal -- Pharyngeal- Nectar Teaspoon -- Pharyngeal -- Pharyngeal- Nectar Cup -- Pharyngeal --  Pharyngeal- Nectar Straw -- Pharyngeal -- Pharyngeal- Thin Teaspoon NT Pharyngeal -- Pharyngeal- Thin Cup Delayed swallow initiation-pyriform sinuses;Pharyngeal residue - pyriform;Reduced laryngeal elevation Pharyngeal -- Pharyngeal- Thin Straw Delayed swallow initiation-pyriform sinuses;Pharyngeal residue - pyriform;Reduced laryngeal elevation Pharyngeal -- Pharyngeal- Puree Delayed swallow initiation-vallecula;Pharyngeal residue - pyriform;Reduced laryngeal elevation Pharyngeal -- Pharyngeal- Mechanical Soft NT Pharyngeal -- Pharyngeal- Regular Delayed swallow initiation-vallecula Pharyngeal -- Pharyngeal- Multi-consistency NT Pharyngeal -- Pharyngeal- Pill Delayed swallow initiation-vallecula Pharyngeal -- Pharyngeal Comment --  CHL IP CERVICAL ESOPHAGEAL PHASE 05/03/2015 Cervical Esophageal Phase WFL Pudding Teaspoon -- Pudding Cup -- Honey Teaspoon -- Honey Cup -- Nectar Teaspoon -- Nectar Cup -- Nectar Straw -- Thin Teaspoon -- Thin Cup -- Thin Straw -- Puree -- Mechanical Soft -- Regular -- Multi-consistency -- Pill -- Cervical Esophageal Comment -- [place] Siren 05/03/2015, 4:07 PM  Orbie Pyo Colvin Caroli.Ed CCC-SLP Pager [phone]              Scheduled Meds:  Scheduled Meds: . allopurinol  100 mg Oral Daily  . aspirin EC  81 mg Oral Daily  . atorvastatin  40 mg Oral q1800  . calcitRIOL  0.25 mcg Oral Daily  . enoxaparin (LOVENOX) injection  30 mg Subcutaneous QHS  . ezetimibe  10 mg Oral q morning - 10a  . feeding supplement (GLUCERNA SHAKE)  237 mL Oral BID PC  . furosemide  40 mg Intravenous BID  . gabapentin  300 mg Oral QHS  . insulin aspart  0-15 Units Subcutaneous TID WC  . insulin aspart  0-5 Units Subcutaneous QHS  . insulin aspart  3 Units Subcutaneous TID WC  . insulin detemir  16 Units Subcutaneous QHS  . ipratropium-albuterol  3 mL Nebulization TID  . isosorbide mononitrate  60 mg Oral Daily  . latanoprost  1 drop Both Eyes QHS  . levofloxacin (LEVAQUIN) IV  500 mg  Intravenous Q48H  . mometasone-formoterol  2 puff Inhalation BID  . multivitamin with minerals  1 tablet Oral Daily  . pantoprazole  40 mg Oral BID  . ranolazine  500 mg Oral BID  . sertraline  100 mg Oral Daily  . sodium chloride  3 mL Intravenous Q12H   Continuous Infusions:   Time spent on care of this patient: 35 min   [place], MD 05/05/2015, 10:19 AM  LOS: 11 days   [facility] Office  [phone] Pager - Text Page per [email or web address] If 7PM-7AM, please contact night-coverage [email or web address]

## 2015-05-05 NOTE — Progress Notes (Signed)
Pt placed on CPAP on previous settings and 3lt O2 bleed in. tol well

## 2015-05-06 ENCOUNTER — Inpatient Hospital Stay (HOSPITAL_COMMUNITY): Payer: Medicare Other

## 2015-05-06 DIAGNOSIS — N189 Chronic kidney disease, unspecified: Secondary | ICD-10-CM | POA: Insufficient documentation

## 2015-05-06 DIAGNOSIS — N179 Acute kidney failure, unspecified: Secondary | ICD-10-CM | POA: Insufficient documentation

## 2015-05-06 LAB — GLUCOSE, CAPILLARY
GLUCOSE-CAPILLARY: 206 mg/dL — AB (ref 65–99)
GLUCOSE-CAPILLARY: 156 mg/dL — AB (ref 65–99)
GLUCOSE-CAPILLARY: 247 mg/dL — AB (ref 65–99)
Glucose-Capillary: 107 mg/dL — ABNORMAL HIGH (ref 65–99)

## 2015-05-06 MED ORDER — POTASSIUM CHLORIDE CRYS ER 20 MEQ PO TBCR
40.0000 meq | EXTENDED_RELEASE_TABLET | Freq: Once | ORAL | Status: AC
  Administered 2015-05-06: 40 meq via ORAL
  Filled 2015-05-06: qty 2

## 2015-05-06 MED ORDER — GUAIFENESIN ER 600 MG PO TB12: 600.0000 mg | ORAL_TABLET | Freq: Two times a day (BID) | ORAL | Status: DC

## 2015-05-06 NOTE — Progress Notes (Signed)
[facility] Progress Note   [name]  [id number]  DOB: Nov 26, 1929  DOA: 04/24/2015 PCP: Renato Shin, MD  Brief narrative: [name] is a 81 y.o. female  point chronic kidney disease stage III, coronary artery disease, chronic systolic and diastolic heart failure, obstructive sleep apnea, peripheral vascular disease status post left BKA, type 2 diabetes mellitus on insulin presents to the hospital for shortness of breath and cough and is found to have pulmonary edema. She was recently treated with Zithromax and increased dose of Lasix by her PCP however continued to be short of breath at rest. She was started on diuretics after admission however creatinine rose steadily without much results and diuresis. Nephrology consult was requested and it was decided for her to undergo dialysis to help remove excess fluid.   Subjective: No dyspnea. Continues to have a cough.   Assessment/Plan: Principal Problem:   Acute respiratory failure with hypoxia -  Acute on chronic diastolic heart failure- AKI - significant rise in creatinine with diuresis - dialysis started on 11/15 - vein mapping done - appreciate cardiology and nephrology assistance   Active Problems: HCAP? - has had a cough with congestion for 2 wks now - CXR obtained today after fluid has been dialyzed off does not reveal infiltrates consistent with pneumonia - initially received Vancomycin and Ceftaz and then transitioned to Levaquin- cough unfortunately not improving with antibiotics- completed 14 days  - cough likely related to CHF which we are attempting to treat - of note the patient never had leukocytosis or feves - SLP eval- D 3 diet with thin liquids  Junctional rhythm/ NSVT - junctional rhythm resolved with holding B Blocker - management per cardiology   DM (diabetes mellitus), type 2, uncontrolled, with renal complications   - uses 0000000 insulin at home- 14 U only at breakfast - has  been started on Levemir and Novolog in the hospital - she was hypoglycemic on 11/17- Levemir dosage adjusted- sugars improved  Hyperlipidemia -cont statin + zetia   Coronary artery disease due to lipid rich plaque; 70% ostial-proximal AV Groove Cx - not good PCI target -cont Imdur, Ranexa, statin and Aspirin  Anemia of chronic disease - stable     Code Status:     Code Status Orders        Start     Ordered   04/24/15 2353  Full code   Continuous     04/24/15 2354     Family Communication:  Disposition Plan: SNF recommended by PT DVT prophylaxis: heparin Consultants:cardiology, nephrology Procedures:   Antibiotics: Anti-infectives    Start     Dose/Rate Route Frequency Ordered Stop   05/02/15 1000  levofloxacin (LEVAQUIN) IVPB 500 mg  Status:  Discontinued     500 mg 100 mL/hr over 60 Minutes Intravenous Every 48 hours 04/30/15 0940 05/05/15 1024   04/30/15 1200  vancomycin (VANCOCIN) IVPB 1000 mg/200 mL premix  Status:  Discontinued     1,000 mg 200 mL/hr over 60 Minutes Intravenous Every 48 hours 04/28/15 1254 04/30/15 0918   04/30/15 1100  levofloxacin (LEVAQUIN) IVPB 500 mg  Status:  Discontinued     500 mg 100 mL/hr over 60 Minutes Intravenous Every 48 hours 04/28/15 1052 04/29/15 0956   04/30/15 1000  levofloxacin (LEVAQUIN) IVPB 750 mg     750 mg 100 mL/hr over 90 Minutes Intravenous  Once 04/30/15 0944 04/30/15 1206   04/28/15 1100  levofloxacin (LEVAQUIN) IVPB 750 mg     750  mg 100 mL/hr over 90 Minutes Intravenous  Once 04/28/15 1034 04/28/15 1830   04/28/15 1100  cefTAZidime (FORTAZ) 2 g in dextrose 5 % 50 mL IVPB  Status:  Discontinued     2 g 100 mL/hr over 30 Minutes Intravenous Every 24 hours 04/28/15 1037 04/30/15 0940   04/28/15 1100  vancomycin (VANCOCIN) IVPB 1000 mg/200 mL premix     1,000 mg 200 mL/hr over 60 Minutes Intravenous  Once 04/28/15 1048 04/28/15 1408   04/25/15 2100  vancomycin (VANCOCIN) IVPB 1000 mg/200 mL premix  Status:   Discontinued     1,000 mg 200 mL/hr over 60 Minutes Intravenous Every 24 hours 04/24/15 2032 04/26/15 1302   04/25/15 2030  cefTAZidime (FORTAZ) 2 g in dextrose 5 % 50 mL IVPB  Status:  Discontinued     2 g 100 mL/hr over 30 Minutes Intravenous Every 24 hours 04/24/15 2032 04/26/15 1302   04/25/15 0000  cefTAZidime (FORTAZ) 2 g in dextrose 5 % 50 mL IVPB  Status:  Discontinued     2 g 100 mL/hr over 30 Minutes Intravenous 3 times per day 04/24/15 2354 04/25/15 0018   04/24/15 2030  cefTAZidime (FORTAZ) 2 g in dextrose 5 % 50 mL IVPB  Status:  Discontinued     2 g 100 mL/hr over 30 Minutes Intravenous  Once 04/24/15 2025 04/24/15 2032   04/24/15 2030  vancomycin (VANCOCIN) IVPB 1000 mg/200 mL premix  Status:  Discontinued     1,000 mg 200 mL/hr over 60 Minutes Intravenous  Once 04/24/15 2025 04/24/15 2032      Objective: Filed Weights   05/04/15 1121 05/05/15 0700 05/06/15 0527  Weight: 85.4 kg (188 lb 4.4 oz) 82.146 kg (181 lb 1.6 oz) 81.623 kg (179 lb 15.1 oz)    Intake/Output Summary (Last 24 hours) at 05/06/15 1619 Last data filed at 05/06/15 1611  Gross per 24 hour  Intake   1197 ml  Output   1176 ml  Net     21 ml     Vitals Filed Vitals:   05/05/15 2127 05/06/15 0527 05/06/15 0908 05/06/15 1343  BP:  138/50  147/51  Pulse:  68  66  Temp:  97.7 F (36.5 C)  98.8 F (37.1 C)  TempSrc:  Oral  Oral  Resp:  20  20  Height:      Weight:  81.623 kg (179 lb 15.1 oz)    SpO2: 97% 99% 94% 100%    Exam:  General:  Pt is alert, not in acute distress  HEENT: No icterus, No thrush, oral mucosa moist  Cardiovascular: regular rate and rhythm, S1/S2 No murmur  Respiratory: crackles at bases  Abdomen: Soft, +Bowel sounds, non tender, non distended, no guarding  MSK: No LE edema, cyanosis or clubbing  Data Reviewed: Basic Metabolic Panel:  Recent Labs Lab 05/01/15 0454 05/02/15 0836 05/03/15 0540 05/04/15 0900 05/05/15 1300  NA 136 137 138 141 141  K 4.0 3.7  4.4 3.2* 3.2*  CL 94* 96* 94* 98* 99*  CO2 26 23 26 29  32  GLUCOSE 232* 211* 256* 124* 110*  BUN 107* 124* 140* 92* 38*  CREATININE 6.49* 7.27* 7.67* 5.12* 2.65*  CALCIUM 7.9* 7.9* 8.1* 8.3* 8.1*  PHOS  --  8.2* 8.4* 6.0* 2.8   Liver Function Tests:  Recent Labs Lab 05/02/15 0836 05/03/15 0540 05/04/15 0900 05/05/15 1300  ALBUMIN 3.0* 3.0* 2.9* 2.5*   No results for input(s): LIPASE, AMYLASE in the last 168  hours. No results for input(s): AMMONIA in the last 168 hours. CBC:  Recent Labs Lab 05/02/15 0239 05/03/15 0540 05/04/15 0900 05/05/15 1300  WBC 6.7 7.1 11.7* 10.1  NEUTROABS  --   --  9.7*  --   HGB 10.1* 10.4* 10.3* 10.9*  HCT 28.7* 32.4* 30.6* 35.0*  MCV 84.7 80.4 86.0 82.5  PLT 328 269 292 286   Cardiac Enzymes: No results for input(s): CKTOTAL, CKMB, CKMBINDEX, TROPONINI in the last 168 hours. BNP (last 3 results)  Recent Labs  06/17/14 1330 06/17/14 1946 04/24/15 1738  BNP 1951.5* 1985.7* 1594.7*    ProBNP (last 3 results)  Recent Labs  06/08/14 1617  PROBNP 1249.0*    CBG:  Recent Labs Lab 05/05/15 0621 05/05/15 1126 05/05/15 1646 05/06/15 0557 05/06/15 1104  GLUCAP 122* 106* 121* 206* 156*    Recent Results (from the past 240 hour(s))  Urine culture     Status: None   Collection Time: 04/30/15  3:38 PM  Result Value Ref Range Status   Specimen Description URINE, CATHETERIZED  Final   Special Requests NONE  Final   Culture NO GROWTH 1 DAY  Final   Report Status 05/01/2015 FINAL  Final     Studies: No results found.  Scheduled Meds:  Scheduled Meds: . allopurinol  100 mg Oral Daily  . aspirin EC  81 mg Oral Daily  . atorvastatin  40 mg Oral q1800  . calcitRIOL  0.25 mcg Oral Daily  . enoxaparin (LOVENOX) injection  30 mg Subcutaneous QHS  . ezetimibe  10 mg Oral q morning - 10a  . feeding supplement (GLUCERNA SHAKE)  237 mL Oral BID PC  . furosemide  40 mg Intravenous BID  . gabapentin  300 mg Oral QHS  . insulin  aspart  0-15 Units Subcutaneous TID WC  . insulin aspart  0-5 Units Subcutaneous QHS  . insulin aspart  3 Units Subcutaneous TID WC  . insulin detemir  16 Units Subcutaneous QHS  . isosorbide mononitrate  60 mg Oral Daily  . latanoprost  1 drop Both Eyes QHS  . mometasone-formoterol  2 puff Inhalation BID  . multivitamin with minerals  1 tablet Oral Daily  . pantoprazole  40 mg Oral BID  . ranolazine  500 mg Oral BID  . sertraline  100 mg Oral Daily  . sodium chloride  3 mL Intravenous Q12H   Continuous Infusions:   Time spent on care of this patient: 52 min   [place], MD 05/06/2015, 4:19 PM  LOS: 12 days   [facility] Office  [phone] Pager - Text Page per [email or web address] If 7PM-7AM, please contact night-coverage [email or web address]

## 2015-05-06 NOTE — Progress Notes (Signed)
Right  Upper Extremity Vein Map  Bilateral basilic veins are tortuous, small, and located posteriorly.    Cephalic  Segment Diameter Depth Comment  1. Axilla 2.66mm mm   2. Mid upper arm 1.85mm mm   3. Above AC 90mm mm   4. In [facility] 4.29mm mm   5. Below AC 2.21mm mm   6. Mid forearm 2.80mm mm   7. Wrist 2.71mm mm    mm mm    mm mm    mm mm    Basilic  Segment Diameter Depth Comment  1. Axilla 110mm 12mm   2. Mid upper arm 75mm 22mm   3. Above AC mm mm   4. In AC mm mm   5. Below AC 2.32mm 7.77mm   6. Mid forearm 2.57mm 2.13mm   7. Wrist mm mm    mm mm    mm mm    mm mm   Left Upper Extremity Vein Map    Cephalic  Segment Diameter Depth Comment  1. Axilla 2.26mm mm   2. Mid upper arm 2.64mm mm   3. Above AC 2.10mm mm   4. In AC 3.70mm mm   5. Below AC 2.53mm mm   6. Mid forearm 2.60mm mm   7. Wrist 2.48mm mm    mm mm    mm mm    mm mm    Basilic  Segment Diameter Depth Comment  1. Axilla 1.55mm 69mm   2. Mid upper arm 1.34mm 23mm   3. Above AC mm mm   4. In AC 2.71mm 63mm   5. Below AC mm mm   6. Mid forearm 1.69mm 1.41mm   7. Wrist mm mm    mm mm    mm mm    mm mm

## 2015-05-06 NOTE — Progress Notes (Signed)
[facility] ROUNDING NOTE   Subjective:   Interval History: improved with less shortness of breath-- seen by [facility] today  Objective:  Vital signs in last 24 hours:  Temp:  [97.5 F (36.4 C)-98.8 F (37.1 C)] 98.8 F (37.1 C) (11/19 1343) Pulse Rate:  [66-70] 66 (11/19 1343) Resp:  [18-20] 20 (11/19 1343) BP: (138-147)/(50-52) 147/51 mmHg (11/19 1343) SpO2:  [94 %-100 %] 100 % (11/19 1343) Weight:  [81.623 kg (179 lb 15.1 oz)] 81.623 kg (179 lb 15.1 oz) (11/19 0527)  Weight change: -6.477 kg (-14 lb 4.5 oz) Filed Weights   05/04/15 1121 05/05/15 0700 05/06/15 0527  Weight: 85.4 kg (188 lb 4.4 oz) 82.146 kg (181 lb 1.6 oz) 81.623 kg (179 lb 15.1 oz)    Intake/Output: I/O last 3 completed shifts: In: 1034 [P.O.:1034] Out: 5026 [Urine:1525; Other:3500; Stool:1]   Intake/Output this shift:  Total I/O In: 720 [P.O.:720] Out: 425 [Urine:425]  General: Obese, AAOx3, AAF HEENT: MMM, NO LAD, Supple.  Neck: difficult to tell JVD.  Heart: Bradycardia, No MGR.  Lungs: Diffuse crackles bilaterally, appropriate rate, unlabored.  Abdomen: Obese, soft, nontender Extremities: Pitting edema to dependent areas, and L stump.  Skin: Warm and dry Neuro: AAOx3, no focal deficits.   Basic Metabolic Panel:  Recent Labs Lab 05/01/15 0454 05/02/15 0836 05/03/15 0540 05/04/15 0900 05/05/15 1300  NA 136 137 138 141 141  K 4.0 3.7 4.4 3.2* 3.2*  CL 94* 96* 94* 98* 99*  CO2 26 23 26 29  32  GLUCOSE 232* 211* 256* 124* 110*  BUN 107* 124* 140* 92* 38*  CREATININE 6.49* 7.27* 7.67* 5.12* 2.65*  CALCIUM 7.9* 7.9* 8.1* 8.3* 8.1*  PHOS  --  8.2* 8.4* 6.0* 2.8    Liver Function Tests:  Recent Labs Lab 05/02/15 0836 05/03/15 0540 05/04/15 0900 05/05/15 1300  ALBUMIN 3.0* 3.0* 2.9* 2.5*   No results for input(s): LIPASE, AMYLASE in the last 168 hours. No results for input(s): AMMONIA in the last 168 hours.  CBC:  Recent Labs Lab 05/02/15 0239 05/03/15 0540  05/04/15 0900 05/05/15 1300  WBC 6.7 7.1 11.7* 10.1  NEUTROABS  --   --  9.7*  --   HGB 10.1* 10.4* 10.3* 10.9*  HCT 28.7* 32.4* 30.6* 35.0*  MCV 84.7 80.4 86.0 82.5  PLT 328 269 292 286    Cardiac Enzymes: No results for input(s): CKTOTAL, CKMB, CKMBINDEX, TROPONINI in the last 168 hours.  BNP: Invalid input(s): POCBNP  CBG:  Recent Labs Lab 05/05/15 1126 05/05/15 1646 05/06/15 0557 05/06/15 1104 05/06/15 1638  GLUCAP 106* 121* 206* 156* 107*    Microbiology: Results for orders placed or performed during the hospital encounter of 04/24/15  Blood culture (routine x 2)     Status: None   Collection Time: 04/24/15  8:54 PM  Result Value Ref Range Status   Specimen Description BLOOD LEFT ANTECUBITAL  Final   Special Requests BOTTLES DRAWN AEROBIC AND ANAEROBIC 5CC   Final   Culture NO GROWTH 5 DAYS  Final   Report Status 04/29/2015 FINAL  Final  Blood culture (routine x 2)     Status: None   Collection Time: 04/24/15  8:59 PM  Result Value Ref Range Status   Specimen Description BLOOD RIGHT ANTECUBITAL  Final   Special Requests BOTTLES DRAWN AEROBIC AND ANAEROBIC 5CC   Final   Culture NO GROWTH 5 DAYS  Final   Report Status 04/29/2015 FINAL  Final  MRSA PCR Screening  Status: None   Collection Time: 04/24/15 10:51 PM  Result Value Ref Range Status   MRSA by PCR NEGATIVE NEGATIVE Final    Comment:        The GeneXpert MRSA Assay (FDA approved for NASAL specimens only), is one component of a comprehensive MRSA colonization surveillance program. It is not intended to diagnose MRSA infection nor to guide or monitor treatment for MRSA infections.   Urine culture     Status: None   Collection Time: 04/30/15  3:38 PM  Result Value Ref Range Status   Specimen Description URINE, CATHETERIZED  Final   Special Requests NONE  Final   Culture NO GROWTH 1 DAY  Final   Report Status 05/01/2015 FINAL  Final    Coagulation Studies: No results for input(s):  LABPROT, INR in the last 72 hours.  Urinalysis: No results for input(s): COLORURINE, LABSPEC, PHURINE, GLUCOSEU, HGBUR, BILIRUBINUR, KETONESUR, PROTEINUR, UROBILINOGEN, NITRITE, LEUKOCYTESUR in the last 72 hours.  Invalid input(s): APPERANCEUR    Imaging: No results found.   Medications:     . allopurinol  100 mg Oral Daily  . aspirin EC  81 mg Oral Daily  . atorvastatin  40 mg Oral q1800  . calcitRIOL  0.25 mcg Oral Daily  . enoxaparin (LOVENOX) injection  30 mg Subcutaneous QHS  . ezetimibe  10 mg Oral q morning - 10a  . feeding supplement (GLUCERNA SHAKE)  237 mL Oral BID PC  . furosemide  40 mg Intravenous BID  . gabapentin  300 mg Oral QHS  . insulin aspart  0-15 Units Subcutaneous TID WC  . insulin aspart  0-5 Units Subcutaneous QHS  . insulin aspart  3 Units Subcutaneous TID WC  . insulin detemir  16 Units Subcutaneous QHS  . isosorbide mononitrate  60 mg Oral Daily  . latanoprost  1 drop Both Eyes QHS  . mometasone-formoterol  2 puff Inhalation BID  . multivitamin with minerals  1 tablet Oral Daily  . pantoprazole  40 mg Oral BID  . ranolazine  500 mg Oral BID  . sertraline  100 mg Oral Daily  . sodium chloride  3 mL Intravenous Q12H   acetaminophen **OR** acetaminophen, albuterol, antiseptic oral rinse, HYDROcodone-acetaminophen, menthol-cetylpyridinium, nitroGLYCERIN, ondansetron **OR** ondansetron (ZOFRAN) IV  Assessment/ Plan:  Admitted with worsening edema and shortness of breath. Cardiac catheterization in September 2016. Treated with zithromycin for pneumonia. Did receive vancomycin now stopped. Blood pressure variability but not in shock.  This is puzzling but probably acute tubular necrosis.    1.Renal- acute on chronic renal failure since November 9. Cannot identify any specific nephrotoxins. As above, her blood pressure has been in the 110's she has been bradycardic. Possibly ATN from hypoperfusion? All blood pressure medications have been stopped.  Also in the differential would be interstitial nephritis from antibiotics. Vanc related nephrotoxicity seems unlikely because the level is not high. This is a difficult situation because now her kidney numbers are quite abnormal. Likely due to poor cardiac output. Pt. Is volume overloaded. Renal ultrasound essentially normal. Poor urine output since yesterday.  - Cardiorenal syndrome more likely, with FENA of 0.8%. - Renal U/S normal.  - Renal panel pending this am.  - Began dialysis ( treatment was 11/16 )  - Dialysis catheter placement 11/16 - Probable dialysis tomorrow.  - Continue lasix. 40mg  BID.   2. Hypertension/volume - BP's now on the lower side. she seems volume overloaded. Continue lasix. Started on dialysis 11/16 for fluid removal No antihypertensives  We shall continue dialysis  3. Hyperkalemia- Resolved now 4. Anemia - hemoglobin around 10. No action at this time 5. Discussed with daughter Marlowe Kays and husband -- we shall continue dialysis and -proceeding with CLIP process and consulted [facility] for permanent access. Vein mapping planned    LOS: 12 Chaniyah Jahr W @TODAY @6 :11 PM

## 2015-05-06 NOTE — [place] FL2 (Signed)
[place] MEDICAID FL2 LEVEL OF CARE SCREENING TOOL     IDENTIFICATION  Patient Name: [name] Birthdate: Aug 25, 1929 Sex: female Admission Date (Current Location): 04/24/2015  [facility] and [place] Number: Herbalist and Address:  [facility], [place] [address] [place], [place] [address]      Provider Number:    Attending Physician Name and Address:  Debbe Odea, MD  Relative Name and Phone Number:       Current Level of Care: SNF Recommended Level of Care: [place] Prior Approval Number:    Date Approved/Denied:   PASRR Number:    Discharge Plan: SNF    Current Diagnoses: Patient Active Problem List   Diagnosis Date Noted  . AKI (acute kidney injury) ([place])   . NSVT (nonsustained ventricular tachycardia) ([place]) 04/26/2015  . Acute on chronic congestive heart failure ([place])   . CHF exacerbation ([place]) 04/24/2015  . Acute on chronic diastolic heart failure ([place]) 04/24/2015  . Acute respiratory failure with hypoxia ([place]) 04/24/2015  . HCAP (healthcare-associated pneumonia) 04/24/2015  . Chronic combined systolic and diastolic CHF (congestive heart failure) ([place])   . Elevated troponin 03/06/2015  . Dizziness 10/25/2014  . Wellness examination 08/18/2014  . Caries 08/01/2014  . Xerostomia 08/01/2014  . Hyperkalemia 06/22/2014  . Sinus bradycardia 06/19/2014  . Contrast media allergy 06/19/2014  . NSTEMI (non-ST elevated myocardial infarction) ([place]) 06/17/2014  . Secondary DM with peripheral vascular disease ([place]) 12/13/2013  . Acute on chronic kidney failure ([place]) 12/13/2013  . Gout 12/09/2013  . Avulsion injury of left iliopsoas and hematoma 12/06/2013  . Left hip pain 12/05/2013  . Left leg pain 12/05/2013  . Hyperuricemia 07/12/2013  . UTI (urinary tract infection) 02/19/2013  . Lower limb amputation, below knee 09/10/2012  . Hypertension associated with diabetes ([place]) 09/10/2012  . Gait instability:  Status post left BKA 09/10/2012  . Acute kidney injury ([place]) 09/04/2012  . Ischemic foot 08/30/2012  . Vitamin D deficiency 02/20/2012  . Encounter for long-term (current) use of other medications 11/21/2011  . Hematuria 01/03/2011  . POLYURIA 04/02/2010  . HYPOKALEMIA 01/22/2010  . PROTEINURIA 05/30/2009  . CAROTID BRUIT, LEFT 05/18/2009  . DYSPHAGIA 04/05/2009  . Obstructive sleep apnea 10/12/2008  . PERS HX NONCOMPLIANCE W/MED TX PRS HAZARDS HLTH 09/01/2008  . Acute renal failure superimposed on stage 3 chronic kidney disease ([place]) 07/19/2008  . Osteoporosis 06/17/2008  . EDEMA 12/21/2007  . Hyperlipidemia 07/16/2007  . DEPRESSION 07/16/2007  . PERIPHERAL NEUROPATHY 07/16/2007  . ASTHMA 07/16/2007  . COPD 07/16/2007  . PEPTIC ULCER DISEASE 07/16/2007  . DM (diabetes mellitus), type 2, uncontrolled, with renal complications ([place]) [id number]  . Essential hypertension 01/22/2007  . Coronary artery disease due to lipid rich plaque; 70% ostial-proximal AV Groove Cx - not good PCI target 01/22/2007  . PERIPHERAL VASCULAR DISEASE 01/22/2007  . GERD 01/22/2007  . OSTEOARTHRITIS 01/22/2007    Orientation ACTIVITIES/SOCIAL BLADDER RESPIRATION    Self, Time, Situation, Place  Passive Continent Normal  BEHAVIORAL SYMPTOMS/MOOD NEUROLOGICAL BOWEL NUTRITION STATUS  Other (Comment) (appropriate)   Continent  (heart)  PHYSICIAN VISITS COMMUNICATION OF NEEDS Height & Weight Skin    Verbally   179 lbs. PU Stage and Appropriate Care          AMBULATORY STATUS RESPIRATION    Assist extensive Normal      Personal Care Assistance Level of Assistance  Bathing, Dressing Bathing Assistance: Limited assistance   Dressing Assistance: Limited assistance  Functional Limitations Info                SPECIAL CARE FACTORS FREQUENCY                      Additional Factors Info  Code Status Code Status Info: full             Current Medications  (05/06/2015): Current Facility-Administered Medications  Medication Dose Route Frequency Provider Last Rate Last Dose  . acetaminophen (TYLENOL) tablet 650 mg  650 mg Oral Q6H PRN Rise Patience, MD       Or  . acetaminophen (TYLENOL) suppository 650 mg  650 mg Rectal Q6H PRN Rise Patience, MD      . albuterol (PROVENTIL) (2.5 MG/3ML) 0.083% nebulizer solution 2.5 mg  2.5 mg Nebulization Q2H PRN Rise Patience, MD      . allopurinol (ZYLOPRIM) tablet 100 mg  100 mg Oral Daily Rise Patience, MD   100 mg at 05/06/15 0909  . antiseptic oral rinse (BIOTENE) solution 15 mL  15 mL Mouth Rinse PRN Debbe Odea, MD      . aspirin EC tablet 81 mg  81 mg Oral Daily Rise Patience, MD   81 mg at 05/06/15 0910  . atorvastatin (LIPITOR) tablet 40 mg  40 mg Oral q1800 Rise Patience, MD   40 mg at 05/05/15 1717  . calcitRIOL (ROCALTROL) capsule 0.25 mcg  0.25 mcg Oral Daily Rise Patience, MD   0.25 mcg at 05/06/15 0909  . enoxaparin (LOVENOX) injection 30 mg  30 mg Subcutaneous QHS Monia Sabal, PA-C   30 mg at 05/05/15 2201  . ezetimibe (ZETIA) tablet 10 mg  10 mg Oral q morning - 10a Rise Patience, MD   10 mg at 05/06/15 0909  . feeding supplement (GLUCERNA SHAKE) (GLUCERNA SHAKE) liquid 237 mL  237 mL Oral BID PC Reanne J Barbato, RD   237 mL at 05/06/15 0914  . furosemide (LASIX) injection 40 mg  40 mg Intravenous BID Debbe Odea, MD   40 mg at 05/06/15 0828  . gabapentin (NEURONTIN) capsule 300 mg  300 mg Oral QHS Rise Patience, MD   300 mg at 05/05/15 2201  . guaiFENesin (ROBITUSSIN) 100 MG/5ML solution 100 mg  5 mL Oral Q6H PRN Rise Patience, MD   100 mg at 05/05/15 2201  . HYDROcodone-acetaminophen (NORCO) 10-325 MG per tablet 1 tablet  1 tablet Oral Q6H PRN Rise Patience, MD   1 tablet at 04/26/15 2238  . insulin aspart (novoLOG) injection 0-15 Units  0-15 Units Subcutaneous TID WC Charlynne Cousins, MD   3 Units at 05/06/15 1215  .  insulin aspart (novoLOG) injection 0-5 Units  0-5 Units Subcutaneous QHS Charlynne Cousins, MD   2 Units at 05/01/15 2230  . insulin aspart (novoLOG) injection 3 Units  3 Units Subcutaneous TID WC Debbe Odea, MD   3 Units at 05/06/15 1214  . insulin detemir (LEVEMIR) injection 16 Units  16 Units Subcutaneous QHS Debbe Odea, MD   16 Units at 05/05/15 2319  . isosorbide mononitrate (IMDUR) 24 hr tablet 60 mg  60 mg Oral Daily Rise Patience, MD   60 mg at 05/06/15 0909  . latanoprost (XALATAN) 0.005 % ophthalmic solution 1 drop  1 drop Both Eyes QHS Rise Patience, MD   1 drop at 05/05/15 2202  . menthol-cetylpyridinium (CEPACOL) lozenge 3 mg  1 lozenge  Oral PRN Jeryl Columbia, NP   3 mg at 04/26/15 2239  . mometasone-formoterol (DULERA) 100-5 MCG/ACT inhaler 2 puff  2 puff Inhalation BID Rise Patience, MD   2 puff at 05/06/15 0905  . multivitamin with minerals tablet 1 tablet  1 tablet Oral Daily Lorenda Peck, RD   1 tablet at 05/06/15 0909  . nitroGLYCERIN (NITROSTAT) SL tablet 0.4 mg  0.4 mg Sublingual Q5 min PRN Rise Patience, MD      . ondansetron [facility]) tablet 4 mg  4 mg Oral Q6H PRN Rise Patience, MD       Or  . ondansetron [facility]) injection 4 mg  4 mg Intravenous Q6H PRN Rise Patience, MD      . pantoprazole (PROTONIX) EC tablet 40 mg  40 mg Oral BID Rise Patience, MD   40 mg at 05/06/15 0909  . ranolazine (RANEXA) 12 hr tablet 500 mg  500 mg Oral BID Rise Patience, MD   500 mg at 05/06/15 0909  . sertraline (ZOLOFT) tablet 100 mg  100 mg Oral Daily Rise Patience, MD   100 mg at 05/06/15 1216  . sodium chloride 0.9 % injection 3 mL  3 mL Intravenous Q12H Rise Patience, MD   3 mL at 05/05/15 2202   Do not use this list as official medication orders. Please verify with discharge summary.  Discharge Medications:   Medication List    ASK your doctor about these medications        acetaminophen 500 MG tablet   Commonly known as:  TYLENOL  Take 500 mg by mouth every 6 (six) hours as needed for mild pain.     albuterol (2.5 MG/3ML) 0.083% nebulizer solution  Commonly known as:  PROVENTIL  Take 3 mLs (2.5 mg total) by nebulization every 4 (four) hours as needed for wheezing or shortness of breath.     albuterol 108 (90 BASE) MCG/ACT inhaler  Commonly known as:  PROVENTIL HFA;VENTOLIN HFA  Inhale 2 puffs into the lungs every 4 (four) hours as needed for wheezing or shortness of breath.     allopurinol 100 MG tablet  Commonly known as:  ZYLOPRIM  TAKE 1 TABLET BY MOUTH DAILY     allopurinol 100 MG tablet  Commonly known as:  ZYLOPRIM  TAKE 1 TABLET BY MOUTH EVERY DAY     amLODipine 10 MG tablet  Commonly known as:  NORVASC  TAKE 1 TABLET BY MOUTH DAILY     aspirin EC 81 MG tablet  Take 1 tablet (81 mg total) by mouth daily.     atorvastatin 40 MG tablet  Commonly known as:  LIPITOR  TAKE 1 TABLET BY MOUTH DAILY AT 6 PM     azithromycin 500 MG tablet  Commonly known as:  ZITHROMAX  Take 1 tablet (500 mg total) by mouth daily.     calcitRIOL 0.25 MCG capsule  Commonly known as:  ROCALTROL  TAKE ONE CAPSULE BY MOUTH EVERY DAY     clopidogrel 75 MG tablet  Commonly known as:  PLAVIX  Take 75 mg by mouth daily.     donepezil 5 MG tablet  Commonly known as:  ARICEPT  Take 1 tablet (5 mg total) by mouth at bedtime.     feeding supplement (GLUCERNA SHAKE) Liqd  Take 237 mLs by mouth every morning.     Fluticasone-Salmeterol 100-50 MCG/DOSE Aepb  Commonly known as:  ADVAIR DISKUS  Inhale 1  puff into the lungs 2 (two) times daily.     furosemide 40 MG tablet  Commonly known as:  LASIX  Take 1 tablet (40 mg total) by mouth daily.     gabapentin 300 MG capsule  Commonly known as:  NEURONTIN  TAKE ONE CAPSULE BY MOUTH EVERY NIGHT AT BEDTIME     hydrALAZINE 25 MG tablet  Commonly known as:  APRESOLINE  TAKE 1 TABLET BY MOUTH THREE TIMES DAILY     HYDROcodone-acetaminophen  10-325 MG tablet  Commonly known as:  NORCO  Take 1 tablet by mouth every 6 (six) hours as needed for moderate pain.     insulin NPH-regular Human (70-30) 100 UNIT/ML injection  Commonly known as:  NOVOLIN 70/30  Inject 14 Units into the skin daily with breakfast. Sliding scale. Only takes in the morning     isosorbide mononitrate 60 MG 24 hr tablet  Commonly known as:  IMDUR  Take 60 mg by mouth daily.     isosorbide-hydrALAZINE 20-37.5 MG tablet  Commonly known as:  BIDIL  Take 1 tablet by mouth 3 (three) times daily.     latanoprost 0.005 % ophthalmic solution  Commonly known as:  XALATAN  Place 1 drop into both eyes at bedtime.     nitroGLYCERIN 0.4 MG SL tablet  Commonly known as:  NITROSTAT  Place 0.4 mg under the tongue every 5 (five) minutes as needed for chest pain.     ONE TOUCH ULTRA TEST test strip  Generic drug:  glucose blood  USE TO TEST BLOOD SUGAR TWICE DAILY.     pantoprazole 40 MG tablet  Commonly known as:  PROTONIX  TAKE 1 TABLET BY MOUTH TWICE DAILY     potassium chloride 10 MEQ tablet  Commonly known as:  K-DUR,KLOR-CON  Take 1 tablet (10 mEq total) by mouth daily.     ranolazine 500 MG 12 hr tablet  Commonly known as:  RANEXA  Take 1 tablet (500 mg total) by mouth 2 (two) times daily.     sertraline 100 MG tablet  Commonly known as:  ZOLOFT  TAKE 1 TABLET BY MOUTH EVERY DAY     ZETIA 10 MG tablet  Generic drug:  ezetimibe  TAKE 1 TABLET BY MOUTH EVERY MORNING        Relevant Imaging Results:  Relevant Lab Results:  Recent Labs    Additional Information lohexol  Matilde Bash, LCSW

## 2015-05-06 NOTE — Progress Notes (Signed)
Patient Name: [name] Date of Encounter: 05/06/2015  Primary Cardiologist: Dr. Meda Coffee   Principal Problem:   Acute respiratory failure with hypoxia [facility]) Active Problems:   DM (diabetes mellitus), type 2, uncontrolled, with renal complications ([place])   Hyperlipidemia   Essential hypertension   Coronary artery disease due to lipid rich plaque; 70% ostial-proximal AV Groove Cx - not good PCI target   Acute on chronic diastolic heart failure (HCC)   HCAP (healthcare-associated pneumonia)   NSVT (nonsustained ventricular tachycardia) (HCC)   Acute on chronic congestive heart failure (HCC)   AKI (acute kidney injury) ([place])    SUBJECTIVE  Increasing SOB this morning.    CURRENT MEDS . allopurinol  100 mg Oral Daily  . aspirin EC  81 mg Oral Daily  . atorvastatin  40 mg Oral q1800  . calcitRIOL  0.25 mcg Oral Daily  . enoxaparin (LOVENOX) injection  30 mg Subcutaneous QHS  . ezetimibe  10 mg Oral q morning - 10a  . feeding supplement (GLUCERNA SHAKE)  237 mL Oral BID PC  . furosemide  40 mg Intravenous BID  . gabapentin  300 mg Oral QHS  . insulin aspart  0-15 Units Subcutaneous TID WC  . insulin aspart  0-5 Units Subcutaneous QHS  . insulin aspart  3 Units Subcutaneous TID WC  . insulin detemir  16 Units Subcutaneous QHS  . isosorbide mononitrate  60 mg Oral Daily  . latanoprost  1 drop Both Eyes QHS  . mometasone-formoterol  2 puff Inhalation BID  . multivitamin with minerals  1 tablet Oral Daily  . pantoprazole  40 mg Oral BID  . ranolazine  500 mg Oral BID  . sertraline  100 mg Oral Daily  . sodium chloride  3 mL Intravenous Q12H    OBJECTIVE  Filed Vitals:   05/05/15 2059 05/05/15 2127 05/06/15 0527 05/06/15 0908  BP: 141/52  138/50   Pulse: 70  68   Temp: 97.5 F (36.4 C)  97.7 F (36.5 C)   TempSrc: Oral  Oral   Resp: 18  20   Height:      Weight:   179 lb 15.1 oz (81.623 kg)   SpO2: 98% 97% 99% 94%    Intake/Output Summary (Last 24  hours) at 05/06/15 1031 Last data filed at 05/06/15 1014  Gross per 24 hour  Intake    717 ml  Output   4426 ml  Net  -3709 ml   Filed Weights   05/04/15 1121 05/05/15 0700 05/06/15 0527  Weight: 188 lb 4.4 oz (85.4 kg) 181 lb 1.6 oz (82.146 kg) 179 lb 15.1 oz (81.623 kg)    PHYSICAL EXAM  General: Pleasant, NAD. Neuro: Alert and oriented X 3. Moves all extremities spontaneously. Psych: Normal affect. HEENT:  Normal  Neck: Supple without bruits. Lungs:  Resp regular and unlabored. Bibasilar rale Heart: RRR no s3, s4, or murmurs. Abdomen: Soft, non-tender, non-distended, BS + x 4.  Extremities: No clubbing, cyanosis. L BKA. 1-2+ pitting edema  Accessory Clinical Findings  CBC  Recent Labs  05/04/15 0900 05/05/15 1300  WBC 11.7* 10.1  NEUTROABS 9.7*  --   HGB 10.3* 10.9*  HCT 30.6* 35.0*  MCV 86.0 82.5  PLT 292 [id number]   Basic Metabolic Panel  Recent Labs  05/04/15 0900 05/05/15 1300  NA 141 141  K 3.2* 3.2*  CL 98* 99*  CO2 29 32  GLUCOSE 124* 110*  BUN 92* 38*  CREATININE 5.12* 2.65*  CALCIUM 8.3* 8.1*  PHOS 6.0* 2.8   Liver Function Tests  Recent Labs  05/04/15 0900 05/05/15 1300  ALBUMIN 2.9* 2.5*    TELE Sinus brady with occasional wide complex brady with retrograde p waves    ECG  No new EKG  Echocardiogram 06/23/2014  LV EF: 50% -  55%  ------------------------------------------------------------------- Indications:   MI - acute 410.91.  ------------------------------------------------------------------- History:  PMH:  Coronary artery disease. Congestive heart failure. Chronic obstructive pulmonary disease. Risk factors: Hypertension. Diabetes mellitus. Dyslipidemia.  ------------------------------------------------------------------- Study Conclusions  - Left ventricle: The cavity size was normal. There was moderate concentric hypertrophy. Systolic function was normal. The estimated ejection fraction was in the  range of 50% to 55%. Wall motion was normal; there were no regional wall motion abnormalities. Doppler parameters are consistent with a reversible restrictive pattern, indicative of decreased left ventricular diastolic compliance and/or increased left atrial pressure (grade 3 diastolic dysfunction). - Aortic valve: Cusp separation was reduced. - Mitral valve: Moderately calcified annulus. - Left atrium: The atrium was mildly dilated.               ASSESSMENT AND PLAN  81 y.o. female with past medical history significant for diabetes mellitus, hypertension, hyperlipidemia, obesity, OSA on CPAP, COPD, coronary artery disease with also a mild cardiomyopathy EF in the 37s, PAD status post BKA as well as CKD appearing to have a creatinine of between 1.6 and 2.0 and is followed by Dr. Lorrene Reid at [facility]. Last cath 02/2015 non obstructed disease. There was mild left ventricular systolic dysfunction. Severely elevated LVEDP of 33-38 mmHg suggestive of significant diastolic heart failure.   1. Acute on chronic diastolic HF  - fluid overloaded with declining renal function requiring temp dialysis.  Had dialysis yesterday  Breathing is better today  It may be difficult to control her diastolic CHF without continuing dialysis.   2. Junctional rhythm  - had junctional bradycardia after BB, beta blocker stopped, now in sinus brady  3. HCAP: per IM  4. Acute on chronic renal insufficiency: felt to be ATN per nephrology. Undergoing temp dialysis.  - BP stable with HD. If BP remain elevated on non-dialysis days, will consider add hydralazine.   5. NSVT: off BB due to persistent junctional rhythm    6. CAD: nonobstructive on cath 03/07/2015 EF 45-50%    Nahser, Wonda Cheng, MD  05/06/2015 10:39 AM    [facility] [place],  [place], [place]  [address] Pager [phone] Phone: [phone]; Fax: [phone]   [facility]  [address] [place], [place]  [phone]   Fax [phone]

## 2015-05-07 LAB — GLUCOSE, CAPILLARY
GLUCOSE-CAPILLARY: 219 mg/dL — AB (ref 65–99)
GLUCOSE-CAPILLARY: 163 mg/dL — AB (ref 65–99)
Glucose-Capillary: 243 mg/dL — ABNORMAL HIGH (ref 65–99)
Glucose-Capillary: 276 mg/dL — ABNORMAL HIGH (ref 65–99)

## 2015-05-07 LAB — RENAL FUNCTION PANEL
ANION GAP: 8 (ref 5–15)
ANION GAP: 9 (ref 5–15)
Albumin: 2.7 g/dL — ABNORMAL LOW (ref 3.5–5.0)
Albumin: 2.6 g/dL — ABNORMAL LOW (ref 3.5–5.0)
BUN: 46 mg/dL — ABNORMAL HIGH (ref 6–20)
BUN: 49 mg/dL — ABNORMAL HIGH (ref 6–20)
CALCIUM: 8.2 mg/dL — AB (ref 8.9–10.3)
CHLORIDE: 101 mmol/L (ref 101–111)
CHLORIDE: 98 mmol/L — AB (ref 101–111)
CO2: 29 mmol/L (ref 22–32)
CO2: 30 mmol/L (ref 22–32)
Calcium: 8.3 mg/dL — ABNORMAL LOW (ref 8.9–10.3)
Creatinine, Ser: 3.2 mg/dL — ABNORMAL HIGH (ref 0.44–1.00)
Creatinine, Ser: 3.2 mg/dL — ABNORMAL HIGH (ref 0.44–1.00)
GFR calc non Af Amer: 12 mL/min — ABNORMAL LOW (ref >60)
GFR, EST AFRICAN AMERICAN: 14 mL/min — AB (ref >60)
GFR, EST AFRICAN AMERICAN: 14 mL/min — AB (ref >60)
GFR, EST NON AFRICAN AMERICAN: 12 mL/min — AB (ref >60)
Glucose, Bld: 176 mg/dL — ABNORMAL HIGH (ref 65–99)
Glucose, Bld: 258 mg/dL — ABNORMAL HIGH (ref 65–99)
Phosphorus: 4 mg/dL (ref 2.5–4.6)
Phosphorus: 4 mg/dL (ref 2.5–4.6)
Potassium: 3.6 mmol/L (ref 3.5–5.1)
Potassium: 3.6 mmol/L (ref 3.5–5.1)
Sodium: 138 mmol/L (ref 135–145)
Sodium: 137 mmol/L (ref 135–145)

## 2015-05-07 LAB — CBC
HCT: 34.1 % — ABNORMAL LOW (ref 36.0–46.0)
HEMOGLOBIN: 11 g/dL — AB (ref 12.0–15.0)
MCH: 28.4 pg (ref 26.0–34.0)
MCHC: 32.3 g/dL (ref 30.0–36.0)
MCV: 87.9 fL (ref 78.0–100.0)
Platelets: 279 10*3/uL (ref 150–400)
RBC: 3.88 MIL/uL (ref 3.87–5.11)
RDW: 20.3 % — ABNORMAL HIGH (ref 11.5–15.5)
WBC: 12.2 10*3/uL — AB (ref 4.0–10.5)

## 2015-05-07 MED ORDER — ALTEPLASE 2 MG IJ SOLR: 2.0000 mg | Freq: Once | INTRAMUSCULAR | Status: DC | PRN

## 2015-05-07 MED ORDER — SODIUM CHLORIDE 0.9 % IV SOLN: 100.0000 mL | INTRAVENOUS | Status: DC | PRN

## 2015-05-07 MED ORDER — LIDOCAINE HCL (PF) 1 % IJ SOLN: 5.0000 mL | INTRAMUSCULAR | Status: DC | PRN

## 2015-05-07 MED ORDER — HEPARIN SODIUM (PORCINE) 1000 UNIT/ML DIALYSIS: 1000.0000 [IU] | INTRAMUSCULAR | Status: DC | PRN

## 2015-05-07 MED ORDER — LIDOCAINE-PRILOCAINE 2.5-2.5 % EX CREA: 1.0000 "application " | TOPICAL_CREAM | CUTANEOUS | Status: DC | PRN

## 2015-05-07 MED ORDER — PENTAFLUOROPROP-TETRAFLUOROETH EX AERO: 1.0000 "application " | INHALATION_SPRAY | CUTANEOUS | Status: DC | PRN

## 2015-05-07 NOTE — Progress Notes (Signed)
[facility] Progress Note   [name]  [id number]  DOB: 18-Mar-1930  DOA: 04/24/2015 PCP: Renato Shin, MD  Brief narrative: [name] is a 81 y.o. female  point chronic kidney disease stage III, coronary artery disease, chronic systolic and diastolic heart failure, obstructive sleep apnea, peripheral vascular disease status post left BKA, type 2 diabetes mellitus on insulin presents to the hospital for shortness of breath and cough and is found to have pulmonary edema. She was recently treated with Zithromax and increased dose of Lasix by her PCP however continued to be short of breath at rest. She was started on diuretics after admission however creatinine rose steadily without much results and diuresis. Nephrology consult was requested and it was decided for her to undergo dialysis to help remove excess fluid.   Subjective: Cough is slightly better today. No new symptoms  Assessment/Plan: Principal Problem:   Acute respiratory failure with hypoxia -  Acute on chronic diastolic heart failure- AKI - significant rise in creatinine with diuresis - dialysis started on 11/15 - vein mapping done - appreciate cardiology and nephrology assistance   Active Problems: HCAP? - has had a cough with congestion for 2 wks now - CXR obtained today after fluid has been dialyzed off does not reveal infiltrates consistent with pneumonia - initially received Vancomycin and Ceftaz and then transitioned to Levaquin- cough unfortunately not improving with antibiotics- completed 14 days  - cough likely related to CHF which we are attempting to treat - of note the patient never had leukocytosis or fevers - SLP eval- D 3 diet with thin liquids  Junctional rhythm/ NSVT - junctional rhythm resolved with holding B Blocker - management per cardiology   DM (diabetes mellitus), type 2, uncontrolled, with renal complications   - uses 0000000 insulin at home- 14 U only at  breakfast - has been started on Levemir and Novolog in the hospital - she was hypoglycemic on 11/17- Levemir dosage adjusted- sugars improved  Hyperlipidemia -cont statin + zetia   Coronary artery disease due to lipid rich plaque; 70% ostial-proximal AV Groove Cx - not good PCI target -cont Imdur, Ranexa, statin and Aspirin  Anemia of chronic disease - stable     Code Status:     Code Status Orders        Start     Ordered   04/24/15 2353  Full code   Continuous     04/24/15 2354     Family Communication:  Disposition Plan: SNF recommended by PT DVT prophylaxis: heparin Consultants:cardiology, nephrology Procedures:   Antibiotics: Anti-infectives    Start     Dose/Rate Route Frequency Ordered Stop   05/02/15 1000  levofloxacin (LEVAQUIN) IVPB 500 mg  Status:  Discontinued     500 mg 100 mL/hr over 60 Minutes Intravenous Every 48 hours 04/30/15 0940 05/05/15 1024   04/30/15 1200  vancomycin (VANCOCIN) IVPB 1000 mg/200 mL premix  Status:  Discontinued     1,000 mg 200 mL/hr over 60 Minutes Intravenous Every 48 hours 04/28/15 1254 04/30/15 0918   04/30/15 1100  levofloxacin (LEVAQUIN) IVPB 500 mg  Status:  Discontinued     500 mg 100 mL/hr over 60 Minutes Intravenous Every 48 hours 04/28/15 1052 04/29/15 0956   04/30/15 1000  levofloxacin (LEVAQUIN) IVPB 750 mg     750 mg 100 mL/hr over 90 Minutes Intravenous  Once 04/30/15 0944 04/30/15 1206   04/28/15 1100  levofloxacin (LEVAQUIN) IVPB 750 mg     750  mg 100 mL/hr over 90 Minutes Intravenous  Once 04/28/15 1034 04/28/15 1830   04/28/15 1100  cefTAZidime (FORTAZ) 2 g in dextrose 5 % 50 mL IVPB  Status:  Discontinued     2 g 100 mL/hr over 30 Minutes Intravenous Every 24 hours 04/28/15 1037 04/30/15 0940   04/28/15 1100  vancomycin (VANCOCIN) IVPB 1000 mg/200 mL premix     1,000 mg 200 mL/hr over 60 Minutes Intravenous  Once 04/28/15 1048 04/28/15 1408   04/25/15 2100  vancomycin (VANCOCIN) IVPB 1000 mg/200 mL  premix  Status:  Discontinued     1,000 mg 200 mL/hr over 60 Minutes Intravenous Every 24 hours 04/24/15 2032 04/26/15 1302   04/25/15 2030  cefTAZidime (FORTAZ) 2 g in dextrose 5 % 50 mL IVPB  Status:  Discontinued     2 g 100 mL/hr over 30 Minutes Intravenous Every 24 hours 04/24/15 2032 04/26/15 1302   04/25/15 0000  cefTAZidime (FORTAZ) 2 g in dextrose 5 % 50 mL IVPB  Status:  Discontinued     2 g 100 mL/hr over 30 Minutes Intravenous 3 times per day 04/24/15 2354 04/25/15 0018   04/24/15 2030  cefTAZidime (FORTAZ) 2 g in dextrose 5 % 50 mL IVPB  Status:  Discontinued     2 g 100 mL/hr over 30 Minutes Intravenous  Once 04/24/15 2025 04/24/15 2032   04/24/15 2030  vancomycin (VANCOCIN) IVPB 1000 mg/200 mL premix  Status:  Discontinued     1,000 mg 200 mL/hr over 60 Minutes Intravenous  Once 04/24/15 2025 04/24/15 2032      Objective: Filed Weights   05/05/15 0700 05/06/15 0527 05/07/15 0443  Weight: 82.146 kg (181 lb 1.6 oz) 81.623 kg (179 lb 15.1 oz) 81.2 kg (179 lb 0.2 oz)    Intake/Output Summary (Last 24 hours) at 05/07/15 1535 Last data filed at 05/07/15 1448  Gross per 24 hour  Intake   1400 ml  Output   3300 ml  Net  -1900 ml     Vitals Filed Vitals:   05/07/15 1331 05/07/15 1400 05/07/15 1428 05/07/15 1448  BP: 99/62 96/71 105/59 110/61  Pulse: 76 97 76 77  Temp:    98 F (36.7 C)  TempSrc:      Resp:    20  Height:      Weight:      SpO2:    100%    Exam:  General:  Pt is alert, not in acute distress  HEENT: No icterus, No thrush, oral mucosa moist  Cardiovascular: regular rate and rhythm, S1/S2 No murmur  Respiratory: crackles at bases  Abdomen: Soft, +Bowel sounds, non tender, non distended, no guarding  MSK: No LE edema, cyanosis or clubbing  Data Reviewed: Basic Metabolic Panel:  Recent Labs Lab 05/03/15 0540 05/04/15 0900 05/05/15 1300 05/07/15 0835 05/07/15 1145  NA 138 141 141 138 137  K 4.4 3.2* 3.2* 3.6 3.6  CL 94* 98* 99*  101 98*  CO2 26 29 32 29 30  GLUCOSE 256* 124* 110* 176* 258*  BUN 140* 92* 38* 46* 49*  CREATININE 7.67* 5.12* 2.65* 3.20* 3.20*  CALCIUM 8.1* 8.3* 8.1* 8.3* 8.2*  PHOS 8.4* 6.0* 2.8 4.0 4.0   Liver Function Tests:  Recent Labs Lab 05/03/15 0540 05/04/15 0900 05/05/15 1300 05/07/15 0835 05/07/15 1145  ALBUMIN 3.0* 2.9* 2.5* 2.7* 2.6*   No results for input(s): LIPASE, AMYLASE in the last 168 hours. No results for input(s): AMMONIA in the last 168  hours. CBC:  Recent Labs Lab 05/02/15 0239 05/03/15 0540 05/04/15 0900 05/05/15 1300 05/07/15 1145  WBC 6.7 7.1 11.7* 10.1 12.2*  NEUTROABS  --   --  9.7*  --   --   HGB 10.1* 10.4* 10.3* 10.9* 11.0*  HCT 28.7* 32.4* 30.6* 35.0* 34.1*  MCV 84.7 80.4 86.0 82.5 87.9  PLT 328 269 292 286 279   Cardiac Enzymes: No results for input(s): CKTOTAL, CKMB, CKMBINDEX, TROPONINI in the last 168 hours. BNP (last 3 results)  Recent Labs  06/17/14 1330 06/17/14 1946 04/24/15 1738  BNP 1951.5* 1985.7* 1594.7*    ProBNP (last 3 results)  Recent Labs  06/08/14 1617  PROBNP 1249.0*    CBG:  Recent Labs Lab 05/06/15 1104 05/06/15 1638 05/06/15 2133 05/07/15 0603 05/07/15 1126  GLUCAP 156* 107* 247* 219* 243*    Recent Results (from the past 240 hour(s))  Urine culture     Status: None   Collection Time: 04/30/15  3:38 PM  Result Value Ref Range Status   Specimen Description URINE, CATHETERIZED  Final   Special Requests NONE  Final   Culture NO GROWTH 1 DAY  Final   Report Status 05/01/2015 FINAL  Final     Studies: No results found.  Scheduled Meds:  Scheduled Meds: . allopurinol  100 mg Oral Daily  . aspirin EC  81 mg Oral Daily  . atorvastatin  40 mg Oral q1800  . calcitRIOL  0.25 mcg Oral Daily  . enoxaparin (LOVENOX) injection  30 mg Subcutaneous QHS  . ezetimibe  10 mg Oral q morning - 10a  . feeding supplement (GLUCERNA SHAKE)  237 mL Oral BID PC  . furosemide  40 mg Intravenous BID  .  gabapentin  300 mg Oral QHS  . insulin aspart  0-15 Units Subcutaneous TID WC  . insulin aspart  0-5 Units Subcutaneous QHS  . insulin aspart  3 Units Subcutaneous TID WC  . insulin detemir  16 Units Subcutaneous QHS  . isosorbide mononitrate  60 mg Oral Daily  . latanoprost  1 drop Both Eyes QHS  . mometasone-formoterol  2 puff Inhalation BID  . multivitamin with minerals  1 tablet Oral Daily  . pantoprazole  40 mg Oral BID  . ranolazine  500 mg Oral BID  . sertraline  100 mg Oral Daily  . sodium chloride  3 mL Intravenous Q12H   Continuous Infusions:   Time spent on care of this patient: 35 min   [place], MD 05/07/2015, 3:35 PM  LOS: 13 days   [facility] Office  [phone] Pager - Text Page per [email or web address] If 7PM-7AM, please contact night-coverage [email or web address]

## 2015-05-07 NOTE — Care Management Important Message (Signed)
Important Message  Patient Details  Name: [name] MRN: [id number] Date of Birth: [birth year]   Medicare Important Message Given:  Yes    [name] [name] 05/07/2015, 9:56 AM

## 2015-05-07 NOTE — Progress Notes (Signed)
Patient Name: [name] Date of Encounter: 05/07/2015  Primary Cardiologist: Dr. Meda Coffee   Principal Problem:   Acute respiratory failure with hypoxia [facility]) Active Problems:   DM (diabetes mellitus), type 2, uncontrolled, with renal complications ([place])   Hyperlipidemia   Essential hypertension   Coronary artery disease due to lipid rich plaque; 70% ostial-proximal AV Groove Cx - not good PCI target   Acute on chronic diastolic heart failure (HCC)   HCAP (healthcare-associated pneumonia)   NSVT (nonsustained ventricular tachycardia) (HCC)   Acute on chronic congestive heart failure (HCC)   AKI (acute kidney injury) ([place])   Acute kidney injury superimposed on chronic kidney disease ([place])    SUBJECTIVE  Increasing SOB this morning.    CURRENT MEDS . allopurinol  100 mg Oral Daily  . aspirin EC  81 mg Oral Daily  . atorvastatin  40 mg Oral q1800  . calcitRIOL  0.25 mcg Oral Daily  . enoxaparin (LOVENOX) injection  30 mg Subcutaneous QHS  . ezetimibe  10 mg Oral q morning - 10a  . feeding supplement (GLUCERNA SHAKE)  237 mL Oral BID PC  . furosemide  40 mg Intravenous BID  . gabapentin  300 mg Oral QHS  . insulin aspart  0-15 Units Subcutaneous TID WC  . insulin aspart  0-5 Units Subcutaneous QHS  . insulin aspart  3 Units Subcutaneous TID WC  . insulin detemir  16 Units Subcutaneous QHS  . isosorbide mononitrate  60 mg Oral Daily  . latanoprost  1 drop Both Eyes QHS  . mometasone-formoterol  2 puff Inhalation BID  . multivitamin with minerals  1 tablet Oral Daily  . pantoprazole  40 mg Oral BID  . ranolazine  500 mg Oral BID  . sertraline  100 mg Oral Daily  . sodium chloride  3 mL Intravenous Q12H    OBJECTIVE  Filed Vitals:   05/06/15 1343 05/06/15 2007 05/06/15 2133 05/07/15 0443  BP: 147/51 144/61  152/63  Pulse: 66 65 66 62  Temp: 98.8 F (37.1 C) 98.2 F (36.8 C)  98 F (36.7 C)  TempSrc: Oral Axillary  Axillary  Resp: 20 18 16 18   Height:       Weight:    179 lb 0.2 oz (81.2 kg)  SpO2: 100% 95% 96% 100%    Intake/Output Summary (Last 24 hours) at 05/07/15 1001 Last data filed at 05/07/15 0852  Gross per 24 hour  Intake   1880 ml  Output    450 ml  Net   1430 ml   Filed Weights   05/05/15 0700 05/06/15 0527 05/07/15 0443  Weight: 181 lb 1.6 oz (82.146 kg) 179 lb 15.1 oz (81.623 kg) 179 lb 0.2 oz (81.2 kg)    PHYSICAL EXAM  General: Pleasant, NAD. Neuro: Alert and oriented X 3. Moves all extremities spontaneously. Psych: Normal affect. HEENT:  Normal  Neck: Supple without bruits. Lungs:  Resp regular and unlabored. Bibasilar rales, few rhonchi  Heart: RRR no s3, s4, or murmurs. Abdomen: Soft, non-tender, non-distended, BS + x 4.  Extremities: No clubbing, cyanosis. L BKA.  No edema in right leg.   Accessory Clinical Findings  CBC  Recent Labs  05/05/15 1300  WBC 10.1  HGB 10.9*  HCT 35.0*  MCV 82.5  PLT [id number]   Basic Metabolic Panel  Recent Labs  05/05/15 1300 05/07/15 0835  NA 141 138  K 3.2* 3.6  CL 99* 101  CO2 32 29  GLUCOSE 110*  176*  BUN 38* 46*  CREATININE 2.65* 3.20*  CALCIUM 8.1* 8.3*  PHOS 2.8 4.0   Liver Function Tests  Recent Labs  05/05/15 1300 05/07/15 0835  ALBUMIN 2.5* 2.7*    TELE Sinus rhythm at 72    ECG  No new EKG  Echocardiogram 06/23/2014  LV EF: 50% -  55%  ------------------------------------------------------------------- Indications:   MI - acute 410.91.  ------------------------------------------------------------------- History:  PMH:  Coronary artery disease. Congestive heart failure. Chronic obstructive pulmonary disease. Risk factors: Hypertension. Diabetes mellitus. Dyslipidemia.  ------------------------------------------------------------------- Study Conclusions  - Left ventricle: The cavity size was normal. There was moderate concentric hypertrophy. Systolic function was normal. The estimated ejection fraction was in  the range of 50% to 55%. Wall motion was normal; there were no regional wall motion abnormalities. Doppler parameters are consistent with a reversible restrictive pattern, indicative of decreased left ventricular diastolic compliance and/or increased left atrial pressure (grade 3 diastolic dysfunction). - Aortic valve: Cusp separation was reduced. - Mitral valve: Moderately calcified annulus. - Left atrium: The atrium was mildly dilated.               ASSESSMENT AND PLAN  81 y.o. female with past medical history significant for diabetes mellitus, hypertension, hyperlipidemia, obesity, OSA on CPAP, COPD, coronary artery disease with also a mild cardiomyopathy EF in the 58s, PAD status post BKA as well as CKD appearing to have a creatinine of between 1.6 and 2.0 and is followed by Dr. Lorrene Reid at [facility]. Last cath 02/2015 non obstructed disease. There was mild left ventricular systolic dysfunction. Severely elevated LVEDP of 33-38 mmHg suggestive of significant diastolic heart failure.   1. Acute on chronic diastolic HF  - fluid overloaded with declining renal function requiring   dialysis.  It may be difficult to control her diastolic CHF without continuing dialysis.  Spoke with Dr. Justin Mend.   She will continue to get dialysis  2. CAD: nonobstructive on cath 03/07/2015 EF 45-50%  3. HCAP: per IM  4. Acute on chronic renal insufficiency: felt to be ATN per nephrology. Undergoing temp dialysis.  - BP stable with HD. If BP remain elevated on non-dialysis days, will consider add hydralazine.   5. NSVT: off BB due to persistent junctional rhythm      Paige Monarrez, Wonda Cheng, MD  05/07/2015 10:01 AM    [facility],  [place], [place]  [address] Pager [phone] Phone: [phone]; Fax: [phone]   [facility]  [address] [place], [place]  [address] [phone]   Fax [phone]

## 2015-05-07 NOTE — Progress Notes (Signed)
   VASCULAR SURGERY ASSESSMENT & PLAN:  * I have reviewed her vein map. She appears to have a reasonable cephalic vein on the left arm. She is scheduled for a left arm AV fistula on Wednesday by Dr. Juanda [place]rum[place].  SUBJECTIVE: No specific complaints.  PHYSICAL EXAM: Filed Vitals:   05/06/15 1343 05/06/15 2007 05/06/15 2133 05/07/15 0443  BP: 147/51 144/61  152/63  Pulse: 66 65 66 62  Temp: 98.8 F (37.1 C) 98.2 F (36.8 C)  98 F (36.7 C)  TempSrc: Oral Axillary  Axillary  Resp: 20 18 16 18   Height:      Weight:    179 lb 0.2 oz (81.2 kg)  SpO2: 100% 95% 96% 100%   Palpable left radial pulse.  LABS: Lab Results  Component Value Date   WBC 10.1 05/05/2015   HGB 10.9* 05/05/2015   HCT 35.0* 05/05/2015   MCV 82.5 05/05/2015   PLT 286 05/05/2015   Lab Results  Component Value Date   CREATININE 2.65* 05/05/2015   Lab Results  Component Value Date   INR 1.52* 05/02/2015   CBG (last 3)   Recent Labs  05/06/15 1638 05/06/15 2133 05/07/15 0603  GLUCAP 107* 247* 219*    Principal Problem:   Acute respiratory failure with hypoxia (HCC) Active Problems:   DM (diabetes mellitus), type 2, uncontrolled, with renal complications (HCC)   Hyperlipidemia   Essential hypertension   Coronary artery disease due to lipid rich plaque; 70% ostial-proximal AV Groove Cx - not good PCI target   Acute on chronic diastolic heart failure (HCC)   HCAP (healthcare-associated pneumonia)   NSVT (nonsustained ventricular tachycardia) (HCC)   Acute on chronic congestive heart failure ([place])   AKI (acute kidney injury) ([place])   Acute kidney injury superimposed on chronic kidney disease ([place])    [name] [id number] 05/07/2015

## 2015-05-07 NOTE — Progress Notes (Signed)
[facility] ROUNDING NOTE   Subjective:   Interval History:  Plan dialysis today  Looks so much better today  Objective:  Vital signs in last 24 hours:  Temp:  [98 F (36.7 C)-98.8 F (37.1 C)] 98 F (36.7 C) (11/20 0443) Pulse Rate:  [62-66] 62 (11/20 0443) Resp:  [16-20] 18 (11/20 0443) BP: (144-152)/(51-63) 152/63 mmHg (11/20 0443) SpO2:  [95 %-100 %] 100 % (11/20 0443) FiO2 (%):  [93 %] 93 % (11/20 0747) Weight:  [81.2 kg (179 lb 0.2 oz)] 81.2 kg (179 lb 0.2 oz) (11/20 0443)  Weight change: -0.423 kg (-14.9 oz) Filed Weights   05/05/15 0700 05/06/15 0527 05/07/15 0443  Weight: 82.146 kg (181 lb 1.6 oz) 81.623 kg (179 lb 15.1 oz) 81.2 kg (179 lb 0.2 oz)    Intake/Output: I/O last 3 completed shifts: In: [id number] [P.O.:1677] Out: 701 [Urine:700; Stool:1]   Intake/Output this shift:  Total I/O In: 560 [P.O.:560] Out: 200 [Urine:200]  General: Obese, AAOx3, AAF HEENT: MMM, NO LAD, Supple.  Neck: difficult to tell JVD.  Heart: Bradycardia, No MGR.  Lungs: Diffuse crackles bilaterally, appropriate rate, unlabored.  Abdomen: Obese, soft, nontender Extremities: Pitting edema to dependent areas, and L stump.  Skin: Warm and dry Neuro: AAOx3, no focal deficits.   Basic Metabolic Panel:  Recent Labs Lab 05/02/15 0836 05/03/15 0540 05/04/15 0900 05/05/15 1300 05/07/15 0835  NA 137 138 141 141 138  K 3.7 4.4 3.2* 3.2* 3.6  CL 96* 94* 98* 99* 101  CO2 23 26 29  32 29  GLUCOSE 211* 256* 124* 110* 176*  BUN 124* 140* 92* 38* 46*  CREATININE 7.27* 7.67* 5.12* 2.65* 3.20*  CALCIUM 7.9* 8.1* 8.3* 8.1* 8.3*  PHOS 8.2* 8.4* 6.0* 2.8 4.0    Liver Function Tests:  Recent Labs Lab 05/02/15 0836 05/03/15 0540 05/04/15 0900 05/05/15 1300 05/07/15 0835  ALBUMIN 3.0* 3.0* 2.9* 2.5* 2.7*   No results for input(s): LIPASE, AMYLASE in the last 168 hours. No results for input(s): AMMONIA in the last 168 hours.  CBC:  Recent Labs Lab  05/02/15 0239 05/03/15 0540 05/04/15 0900 05/05/15 1300  WBC 6.7 7.1 11.7* 10.1  NEUTROABS  --   --  9.7*  --   HGB 10.1* 10.4* 10.3* 10.9*  HCT 28.7* 32.4* 30.6* 35.0*  MCV 84.7 80.4 86.0 82.5  PLT 328 269 292 286    Cardiac Enzymes: No results for input(s): CKTOTAL, CKMB, CKMBINDEX, TROPONINI in the last 168 hours.  BNP: Invalid input(s): POCBNP  CBG:  Recent Labs Lab 05/06/15 0557 05/06/15 1104 05/06/15 1638 05/06/15 2133 05/07/15 0603  GLUCAP 206* 156* 107* 247* 219*    Microbiology: Results for orders placed or performed during the hospital encounter of 04/24/15  Blood culture (routine x 2)     Status: None   Collection Time: 04/24/15  8:54 PM  Result Value Ref Range Status   Specimen Description BLOOD LEFT ANTECUBITAL  Final   Special Requests BOTTLES DRAWN AEROBIC AND ANAEROBIC 5CC   Final   Culture NO GROWTH 5 DAYS  Final   Report Status 04/29/2015 FINAL  Final  Blood culture (routine x 2)     Status: None   Collection Time: 04/24/15  8:59 PM  Result Value Ref Range Status   Specimen Description BLOOD RIGHT ANTECUBITAL  Final   Special Requests BOTTLES DRAWN AEROBIC AND ANAEROBIC 5CC   Final   Culture NO GROWTH 5 DAYS  Final   Report Status 04/29/2015 FINAL  Final  MRSA PCR Screening     Status: None   Collection Time: 04/24/15 10:51 PM  Result Value Ref Range Status   MRSA by PCR NEGATIVE NEGATIVE Final    Comment:        The GeneXpert MRSA Assay (FDA approved for NASAL specimens only), is one component of a comprehensive MRSA colonization surveillance program. It is not intended to diagnose MRSA infection nor to guide or monitor treatment for MRSA infections.   Urine culture     Status: None   Collection Time: 04/30/15  3:38 PM  Result Value Ref Range Status   Specimen Description URINE, CATHETERIZED  Final   Special Requests NONE  Final   Culture NO GROWTH 1 DAY  Final   Report Status 05/01/2015 FINAL  Final    Coagulation Studies: No  results for input(s): LABPROT, INR in the last 72 hours.  Urinalysis: No results for input(s): COLORURINE, LABSPEC, PHURINE, GLUCOSEU, HGBUR, BILIRUBINUR, KETONESUR, PROTEINUR, UROBILINOGEN, NITRITE, LEUKOCYTESUR in the last 72 hours.  Invalid input(s): APPERANCEUR    Imaging: No results found.   Medications:     . allopurinol  100 mg Oral Daily  . aspirin EC  81 mg Oral Daily  . atorvastatin  40 mg Oral q1800  . calcitRIOL  0.25 mcg Oral Daily  . enoxaparin (LOVENOX) injection  30 mg Subcutaneous QHS  . ezetimibe  10 mg Oral q morning - 10a  . feeding supplement (GLUCERNA SHAKE)  237 mL Oral BID PC  . furosemide  40 mg Intravenous BID  . gabapentin  300 mg Oral QHS  . insulin aspart  0-15 Units Subcutaneous TID WC  . insulin aspart  0-5 Units Subcutaneous QHS  . insulin aspart  3 Units Subcutaneous TID WC  . insulin detemir  16 Units Subcutaneous QHS  . isosorbide mononitrate  60 mg Oral Daily  . latanoprost  1 drop Both Eyes QHS  . mometasone-formoterol  2 puff Inhalation BID  . multivitamin with minerals  1 tablet Oral Daily  . pantoprazole  40 mg Oral BID  . ranolazine  500 mg Oral BID  . sertraline  100 mg Oral Daily  . sodium chloride  3 mL Intravenous Q12H   acetaminophen **OR** acetaminophen, albuterol, antiseptic oral rinse, HYDROcodone-acetaminophen, menthol-cetylpyridinium, nitroGLYCERIN, ondansetron **OR** ondansetron (ZOFRAN) IV  Assessment/ Plan:  Admitted with worsening edema and shortness of breath. Cardiac catheterization in September 2016. Treated with zithromycin for pneumonia. Did receive vancomycin now stopped. Blood pressure variability but not in shock.  This is puzzling but probably acute tubular necrosis.    1.Renal- acute on chronic renal failure since November 9. Cannot identify any specific nephrotoxins. As above, her blood pressure has been in the 110's she has been bradycardic. Possibly ATN from hypoperfusion? All blood pressure  medications have been stopped. Also in the differential would be interstitial nephritis from antibiotics. Vanc related nephrotoxicity seems unlikely because the level is not high. This is a difficult situation because now her kidney numbers are quite abnormal. Likely due to poor cardiac output. Pt. Is volume overloaded. Renal ultrasound essentially normal. Poor urine output since yesterday.  - Cardiorenal syndrome more likely, with FENA of 0.8%. Diastolic dysfuncton - Renal U/S normal.  - Renal panel  Serially being followed - Began dialysis ( treatment was 11/16 )  - Dialysis catheter placement 11/16 - Probable dialysis tomorrow.  - Continue lasix. 40mg  BID.   2. Hypertension/volume - BP's now on the lower side. she seems volume overloaded.  Continue lasix. Started on dialysis 11/16 for fluid removal No antihypertensives We shall continue dialysis 11/20 3. Hyperkalemia- Resolved now 4. Anemia - hemoglobin around 10. No action at this time 5. Discussed with daughter Marlowe Kays and husband -- we shall continue dialysis and -proceeding with CLIP process and consulted [facility] for permanent access. Vein mapping      LOS: 85 Sheryl Saintil W @TODAY @10 :58 AM

## 2015-05-08 LAB — GLUCOSE, CAPILLARY
GLUCOSE-CAPILLARY: 168 mg/dL — AB (ref 65–99)
Glucose-Capillary: 155 mg/dL — ABNORMAL HIGH (ref 65–99)
Glucose-Capillary: 209 mg/dL — ABNORMAL HIGH (ref 65–99)
Glucose-Capillary: 130 mg/dL — ABNORMAL HIGH (ref 65–99)

## 2015-05-08 LAB — CREATININE, SERUM
CREATININE: 2.87 mg/dL — AB (ref 0.44–1.00)
GFR calc Af Amer: 16 mL/min — ABNORMAL LOW (ref >60)
GFR calc non Af Amer: 14 mL/min — ABNORMAL LOW (ref >60)

## 2015-05-08 MED ORDER — INSULIN ASPART 100 UNIT/ML ~~LOC~~ SOLN
5.0000 [IU] | Freq: Three times a day (TID) | SUBCUTANEOUS | Status: DC
  Administered 2015-05-08 – 2015-05-15 (×12): 5 [IU] via SUBCUTANEOUS

## 2015-05-08 MED ORDER — CEFUROXIME SODIUM 1.5 G IJ SOLR
1.5000 g | INTRAMUSCULAR | Status: AC
  Administered 2015-05-10: 1.5 g via INTRAVENOUS
  Filled 2015-05-08 (×2): qty 1.5

## 2015-05-08 NOTE — Consult Note (Signed)
Pt for left arm AV fistula on Wednesday, Nov 21. Risks benefits complications and procedure details discussed.   Pt wishes to proceed.  Ruta Hinds, MD [facility] Office: [phone] Pager: [phone]

## 2015-05-08 NOTE — Progress Notes (Signed)
Physical Therapy Treatment Patient Details Name: [name] MRN: [id number] DOB: 03-17-1930 Today's Date: 05/08/2015    History of Present Illness [name] is a 81 y.o. female with history of nonobstructive CAD who has been recently admitted in September 2016 for unstable angina and at that time patient had unremarkable cardiac cath and patient had medications adjusted presents to the ER as patient has been having increasing shortness of breath over the last 1 week. Patient also has been having some productive cough with greenish sputum. Patient was placed on azithromycin by patient's primary care physician last week with increasing dose of Lasix from 40 mg daily to twice daily. Despite this patient has been getting short of breath even at rest. PMH includes L BKA, DM, heart failure, HTN, NSTEMI    PT Comments    Pt is getting up to chair with increased effort and needed dense cues for sequencing steps, very limited control of LLE.  Pt is going home at family request but still very much recommend her to SNF due to her dependence.   Follow Up Recommendations  SNF     Equipment Recommendations  None recommended by PT    Recommendations for Other Services Rehab consult     Precautions / Restrictions Precautions Precautions: Fall Precaution Comments: L BKA up in chair today Restrictions Weight Bearing Restrictions: No    Mobility  Bed Mobility Overal bed mobility: Needs Assistance Bed Mobility: Supine to Sit     Supine to sit: Mod assist     General bed mobility comments: trunk support and slid out on bed pad  Transfers Overall transfer level: Needs assistance Equipment used: Rolling walker (2 wheeled) (LLE prosthesis with 2 socks) Transfers: Sit to/from [facility] Sit to Stand: Max assist;+2 physical assistance;+2 safety/equipment Stand pivot transfers: Mod assist;Max assist;+2 physical assistance;+2 safety/equipment       General  transfer comment: prompted prosthesis control and gave pt some assist with belt to support center of trunk  Ambulation/Gait             General Gait Details: transfers to chair only   Stairs            Wheelchair Mobility    Modified Rankin (Stroke Patients Only)       Balance Overall balance assessment: Needs assistance Sitting-balance support: Feet supported Sitting balance-Leahy Scale: Fair Sitting balance - Comments: did fluctutate and lean back at times   Standing balance support: Bilateral upper extremity supported Standing balance-Leahy Scale: Poor                      Cognition Arousal/Alertness: Awake/alert Behavior During Therapy: WFL for tasks assessed/performed Overall Cognitive Status: Within Functional Limits for tasks assessed                      Exercises      General Comments General comments (skin integrity, edema, etc.): Pt is demonstrating better awareness of her posture and how to transition stand but is still proximally weak.  Will continue to work on her control of standing and gait to transition home      Pertinent Vitals/Pain Pain Assessment: No/denies pain    Home Living                      Prior Function            PT Goals (current goals can now be found in the care  plan section) Acute Rehab PT Goals Patient Stated Goal: to go home  Progress towards PT goals: Progressing toward goals    Frequency  Min 3X/week    PT Plan Current plan remains appropriate    Co-evaluation             End of Session Equipment Utilized During Treatment: Gait belt;Oxygen Activity Tolerance: Patient tolerated treatment well Patient left: in chair;with call bell/phone within reach;with family/visitor present;with nursing/sitter in room;with chair alarm set     Time: [id number] PT Time Calculation (min) (ACUTE ONLY): 26 min  Charges:  $Therapeutic Activity: 23-37 mins                    G Codes:       Ramond Dial 05/31/2015, 1:14 PM   Mee Hives, PT MS Acute Rehab Dept. Number: [phone] [phone] and [place] [phone]

## 2015-05-08 NOTE — Procedures (Signed)
Pt placed on CPAP tolerating well and resting comfortably.

## 2015-05-08 NOTE — Progress Notes (Signed)
[facility] Progress Note   [name]  [id number]  DOB: 1930-02-21  DOA: 04/24/2015 PCP: Renato Shin, MD  Brief narrative: [name] is a 81 y.o. female  point chronic kidney disease stage III, coronary artery disease, chronic systolic and diastolic heart failure, obstructive sleep apnea, peripheral vascular disease status post left BKA, type 2 diabetes mellitus on insulin presents to the hospital for shortness of breath and cough and is found to have pulmonary edema. She was recently treated with Zithromax and increased dose of Lasix by her PCP however continued to be short of breath at rest. She was started on diuretics after admission however creatinine rose steadily without much results and diuresis. Nephrology consult was requested and it was decided for her to undergo dialysis to help remove excess fluid.   Subjective: No new complaints. Still has not gotten out of bed to chair.   Assessment/Plan: Principal Problem:   Acute respiratory failure with hypoxia -  Acute on chronic diastolic heart failure- AKI - significant rise in creatinine with diuresis - dialysis started on 11/15 - vein mapping done- left arm fistula scheduled for Wed  Active Problems: HCAP? - has had a cough with congestion for 2 wks now - CXR obtained after fluid has been dialyzed off does not reveal infiltrates consistent with pneumonia - initially received Vancomycin and Ceftaz and then transitioned to Levaquin- cough unfortunately not improving with antibiotics- completed 14 days  - of note the patient never had leukocytosis or fevers - SLP eval- D 3 diet with thin liquids  Junctional rhythm/ NSVT - junctional rhythm resolved with holding B Blocker - management per cardiology   DM (diabetes mellitus), type 2, uncontrolled, with renal complications   - uses 0000000 insulin at home- 14 U only at breakfast - has been started on Levemir and Novolog in the hospital - she was  hypoglycemic on 11/17- Levemir dosage adjusted- sugars improved - have increased Novolog as post meal sugars still elevated  Hyperlipidemia -cont statin + zetia   Coronary artery disease due to lipid rich plaque; 70% ostial-proximal AV Groove Cx - not good PCI target -cont Imdur, Ranexa, statin and Aspirin  Anemia of chronic disease - stable     Code Status:     Code Status Orders        Start     Ordered   04/24/15 2353  Full code   Continuous     04/24/15 2354     Family Communication:  Disposition Plan: SNF recommended by PT DVT prophylaxis: heparin Consultants:cardiology, nephrology Procedures:   Antibiotics: Anti-infectives    Start     Dose/Rate Route Frequency Ordered Stop   05/10/15 0600  cefUROXime (ZINACEF) 1.5 g in dextrose 5 % 50 mL IVPB     1.5 g 100 mL/hr over 30 Minutes Intravenous To [facility] 05/08/15 0821 05/11/15 0600   05/02/15 1000  levofloxacin (LEVAQUIN) IVPB 500 mg  Status:  Discontinued     500 mg 100 mL/hr over 60 Minutes Intravenous Every 48 hours 04/30/15 0940 05/05/15 1024   04/30/15 1200  vancomycin (VANCOCIN) IVPB 1000 mg/200 mL premix  Status:  Discontinued     1,000 mg 200 mL/hr over 60 Minutes Intravenous Every 48 hours 04/28/15 1254 04/30/15 0918   04/30/15 1100  levofloxacin (LEVAQUIN) IVPB 500 mg  Status:  Discontinued     500 mg 100 mL/hr over 60 Minutes Intravenous Every 48 hours 04/28/15 1052 04/29/15 0956   04/30/15 1000  levofloxacin (LEVAQUIN)  IVPB 750 mg     750 mg 100 mL/hr over 90 Minutes Intravenous  Once 04/30/15 0944 04/30/15 1206   04/28/15 1100  levofloxacin (LEVAQUIN) IVPB 750 mg     750 mg 100 mL/hr over 90 Minutes Intravenous  Once 04/28/15 1034 04/28/15 1830   04/28/15 1100  cefTAZidime (FORTAZ) 2 g in dextrose 5 % 50 mL IVPB  Status:  Discontinued     2 g 100 mL/hr over 30 Minutes Intravenous Every 24 hours 04/28/15 1037 04/30/15 0940   04/28/15 1100  vancomycin (VANCOCIN) IVPB 1000 mg/200 mL  premix     1,000 mg 200 mL/hr over 60 Minutes Intravenous  Once 04/28/15 1048 04/28/15 1408   04/25/15 2100  vancomycin (VANCOCIN) IVPB 1000 mg/200 mL premix  Status:  Discontinued     1,000 mg 200 mL/hr over 60 Minutes Intravenous Every 24 hours 04/24/15 2032 04/26/15 1302   04/25/15 2030  cefTAZidime (FORTAZ) 2 g in dextrose 5 % 50 mL IVPB  Status:  Discontinued     2 g 100 mL/hr over 30 Minutes Intravenous Every 24 hours 04/24/15 2032 04/26/15 1302   04/25/15 0000  cefTAZidime (FORTAZ) 2 g in dextrose 5 % 50 mL IVPB  Status:  Discontinued     2 g 100 mL/hr over 30 Minutes Intravenous 3 times per day 04/24/15 2354 04/25/15 0018   04/24/15 2030  cefTAZidime (FORTAZ) 2 g in dextrose 5 % 50 mL IVPB  Status:  Discontinued     2 g 100 mL/hr over 30 Minutes Intravenous  Once 04/24/15 2025 04/24/15 2032   04/24/15 2030  vancomycin (VANCOCIN) IVPB 1000 mg/200 mL premix  Status:  Discontinued     1,000 mg 200 mL/hr over 60 Minutes Intravenous  Once 04/24/15 2025 04/24/15 2032      Objective: Filed Weights   05/06/15 0527 05/07/15 0443 05/08/15 0717  Weight: 81.623 kg (179 lb 15.1 oz) 81.2 kg (179 lb 0.2 oz) 81.103 kg (178 lb 12.8 oz)    Intake/Output Summary (Last 24 hours) at 05/08/15 1241 Last data filed at 05/08/15 0919  Gross per 24 hour  Intake    600 ml  Output   3400 ml  Net  -2800 ml     Vitals Filed Vitals:   05/07/15 2040 05/07/15 2335 05/08/15 0717 05/08/15 1105  BP: 119/51  137/58 133/47  Pulse: 76 77 71 70  Temp: 97.8 F (36.6 C)  97.7 F (36.5 C) 98 F (36.7 C)  TempSrc: Oral  Oral Oral  Resp: 18 20 18 18   Height:      Weight:   81.103 kg (178 lb 12.8 oz)   SpO2: 100% 100% 100% 100%    Exam:  General:  Pt is alert, not in acute distress  HEENT: No icterus, No thrush, oral mucosa moist  Cardiovascular: regular rate and rhythm, S1/S2 No murmur  Respiratory: CTA b/l  Abdomen: Soft, +Bowel sounds, non tender, non distended, no guarding  MSK: No LE  edema, cyanosis or clubbing  Data Reviewed: Basic Metabolic Panel:  Recent Labs Lab 05/03/15 0540 05/04/15 0900 05/05/15 1300 05/07/15 0835 05/07/15 1145 05/08/15 0541  NA 138 141 141 138 137  --   K 4.4 3.2* 3.2* 3.6 3.6  --   CL 94* 98* 99* 101 98*  --   CO2 26 29 32 29 30  --   GLUCOSE 256* 124* 110* 176* 258*  --   BUN 140* 92* 38* 46* 49*  --   CREATININE  7.67* 5.12* 2.65* 3.20* 3.20* 2.87*  CALCIUM 8.1* 8.3* 8.1* 8.3* 8.2*  --   PHOS 8.4* 6.0* 2.8 4.0 4.0  --    Liver Function Tests:  Recent Labs Lab 05/03/15 0540 05/04/15 0900 05/05/15 1300 05/07/15 0835 05/07/15 1145  ALBUMIN 3.0* 2.9* 2.5* 2.7* 2.6*   No results for input(s): LIPASE, AMYLASE in the last 168 hours. No results for input(s): AMMONIA in the last 168 hours. CBC:  Recent Labs Lab 05/02/15 0239 05/03/15 0540 05/04/15 0900 05/05/15 1300 05/07/15 1145  WBC 6.7 7.1 11.7* 10.1 12.2*  NEUTROABS  --   --  9.7*  --   --   HGB 10.1* 10.4* 10.3* 10.9* 11.0*  HCT 28.7* 32.4* 30.6* 35.0* 34.1*  MCV 84.7 80.4 86.0 82.5 87.9  PLT 328 269 292 286 279   Cardiac Enzymes: No results for input(s): CKTOTAL, CKMB, CKMBINDEX, TROPONINI in the last 168 hours. BNP (last 3 results)  Recent Labs  06/17/14 1330 06/17/14 1946 04/24/15 1738  BNP 1951.5* 1985.7* 1594.7*    ProBNP (last 3 results)  Recent Labs  06/08/14 1617  PROBNP 1249.0*    CBG:  Recent Labs Lab 05/07/15 1126 05/07/15 1613 05/07/15 2038 05/08/15 0552 05/08/15 1104  GLUCAP 243* 163* 276* 155* 209*    Recent Results (from the past 240 hour(s))  Urine culture     Status: None   Collection Time: 04/30/15  3:38 PM  Result Value Ref Range Status   Specimen Description URINE, CATHETERIZED  Final   Special Requests NONE  Final   Culture NO GROWTH 1 DAY  Final   Report Status 05/01/2015 FINAL  Final     Studies: No results found.  Scheduled Meds:  Scheduled Meds: . allopurinol  100 mg Oral Daily  . aspirin EC  81 mg  Oral Daily  . atorvastatin  40 mg Oral q1800  . calcitRIOL  0.25 mcg Oral Daily  . [START ON 05/10/2015] cefUROXime (ZINACEF)  IV  1.5 g Intravenous To SS-Surg  . enoxaparin (LOVENOX) injection  30 mg Subcutaneous QHS  . feeding supplement (GLUCERNA SHAKE)  237 mL Oral BID PC  . gabapentin  300 mg Oral QHS  . insulin aspart  0-15 Units Subcutaneous TID WC  . insulin aspart  0-5 Units Subcutaneous QHS  . insulin aspart  3 Units Subcutaneous TID WC  . insulin detemir  16 Units Subcutaneous QHS  . isosorbide mononitrate  60 mg Oral Daily  . latanoprost  1 drop Both Eyes QHS  . mometasone-formoterol  2 puff Inhalation BID  . multivitamin with minerals  1 tablet Oral Daily  . pantoprazole  40 mg Oral BID  . ranolazine  500 mg Oral BID  . sertraline  100 mg Oral Daily  . sodium chloride  3 mL Intravenous Q12H   Continuous Infusions:   Time spent on care of this patient: 35 min   [place], MD 05/08/2015, 12:41 PM  LOS: 14 days   [facility] Office  [phone] Pager - Text Page per [email or web address] If 7PM-7AM, please contact night-coverage [email or web address]

## 2015-05-08 NOTE — Progress Notes (Signed)
Subjective: Interval History: has no complaint .  Objective: Vital signs in last 24 hours: Temp:  [97.7 F (36.5 C)-98 F (36.7 C)] 97.7 F (36.5 C) (11/21 0717) Pulse Rate:  [64-97] 71 (11/21 0717) Resp:  [18-20] 18 (11/21 0717) BP: (96-137)/(48-71) 137/58 mmHg (11/21 0717) SpO2:  [100 %] 100 % (11/21 0717) Weight:  [81.103 kg (178 lb 12.8 oz)] 81.103 kg (178 lb 12.8 oz) (11/21 0717) Weight change:   Intake/Output from previous day: 11/20 0701 - 11/21 0700 In: 1040 [P.O.:1040] Out: 3600 [Urine:600] Intake/Output this shift: Total I/O In: 120 [P.O.:120] Out: 0   General appearance: alert, cooperative and moderately obese Resp: diminished breath sounds bilaterally and rhonchi bilaterally Cardio: S1, S2 normal and systolic murmur: holosystolic 2/6, blowing at apex GI: obese, pos bs,soft, liver down 5 cm Extremities: edema 2+, L BKA, RIJ cath and pale  Lab Results:  Recent Labs  05/05/15 1300 05/07/15 1145  WBC 10.1 12.2*  HGB 10.9* 11.0*  HCT 35.0* 34.1*  PLT 286 279   BMET:  Recent Labs  05/07/15 0835 05/07/15 1145 05/08/15 0541  NA 138 137  --   K 3.6 3.6  --   CL 101 98*  --   CO2 29 30  --   GLUCOSE 176* 258*  --   BUN 46* 49*  --   CREATININE 3.20* 3.20* 2.87*  CALCIUM 8.3* 8.2*  --    No results for input(s): PTH in the last 72 hours. Iron Studies: No results for input(s): IRON, TIBC, TRANSFERRIN, FERRITIN in the last 72 hours.  Studies/Results: No results found.  I have reviewed the patient's current medications.  Assessment/Plan: 1 ESRD starting HD, to get [facility] access on Wed.  HD in am, still vol xs, solute ok 2 anemia on Fe 3 HPTH vit D,check level 4 Obesity 5 DM controlled 6 PVD P HD, access, lower meds. Check Uric acid    LOS: 14 days   [name] L 05/08/2015,10:25 AM

## 2015-05-08 NOTE — Progress Notes (Signed)
Patient Name: [name] Date of Encounter: 05/08/2015  Primary Cardiologist: Dr. Meda Coffee   Principal Problem:   Acute respiratory failure with hypoxia [facility]) Active Problems:   DM (diabetes mellitus), type 2, uncontrolled, with renal complications ([place])   Hyperlipidemia   Essential hypertension   Coronary artery disease due to lipid rich plaque; 70% ostial-proximal AV Groove Cx - not good PCI target   Acute on chronic diastolic heart failure (HCC)   HCAP (healthcare-associated pneumonia)   NSVT (nonsustained ventricular tachycardia) (HCC)   Acute on chronic congestive heart failure (HCC)   AKI (acute kidney injury) ([place])   Acute kidney injury superimposed on chronic kidney disease ([place])    SUBJECTIVE  SOB improving. Denies any significant discomfort.  CURRENT MEDS . allopurinol  100 mg Oral Daily  . aspirin EC  81 mg Oral Daily  . atorvastatin  40 mg Oral q1800  . calcitRIOL  0.25 mcg Oral Daily  . [START ON 05/10/2015] cefUROXime (ZINACEF)  IV  1.5 g Intravenous To SS-Surg  . enoxaparin (LOVENOX) injection  30 mg Subcutaneous QHS  . feeding supplement (GLUCERNA SHAKE)  237 mL Oral BID PC  . gabapentin  300 mg Oral QHS  . insulin aspart  0-15 Units Subcutaneous TID WC  . insulin aspart  0-5 Units Subcutaneous QHS  . insulin aspart  3 Units Subcutaneous TID WC  . insulin detemir  16 Units Subcutaneous QHS  . isosorbide mononitrate  60 mg Oral Daily  . latanoprost  1 drop Both Eyes QHS  . mometasone-formoterol  2 puff Inhalation BID  . multivitamin with minerals  1 tablet Oral Daily  . pantoprazole  40 mg Oral BID  . ranolazine  500 mg Oral BID  . sertraline  100 mg Oral Daily  . sodium chloride  3 mL Intravenous Q12H    OBJECTIVE  Filed Vitals:   05/07/15 2040 05/07/15 2335 05/08/15 0717 05/08/15 1105  BP: 119/51  137/58 133/47  Pulse: 76 77 71 70  Temp: 97.8 F (36.6 C)  97.7 F (36.5 C) 98 F (36.7 C)  TempSrc: Oral  Oral Oral  Resp: $Remo'18 20 18 18   'ItRzh$ Height:      Weight:   178 lb 12.8 oz (81.103 kg)   SpO2: 100% 100% 100% 100%    Intake/Output Summary (Last 24 hours) at 05/08/15 1156 Last data filed at 05/08/15 0919  Gross per 24 hour  Intake    600 ml  Output   3400 ml  Net  -2800 ml   Filed Weights   05/06/15 0527 05/07/15 0443 05/08/15 0717  Weight: 179 lb 15.1 oz (81.623 kg) 179 lb 0.2 oz (81.2 kg) 178 lb 12.8 oz (81.103 kg)    PHYSICAL EXAM  General: Pleasant, NAD. Neuro: Alert and oriented X 3. Moves all extremities spontaneously. Psych: Normal affect. HEENT:  Normal  Neck: Supple without bruits. Lungs:  Resp regular and unlabored. L>R rhonchi, cleared with cough.  Heart: RRR no s3, s4, or murmurs. Abdomen: Soft, non-tender, non-distended, BS + x 4.  Extremities: No clubbing, cyanosis or edema. DP/PT/Radials 2+ and equal bilaterally.  Accessory Clinical Findings  CBC  Recent Labs  05/05/15 1300 05/07/15 1145  WBC 10.1 12.2*  HGB 10.9* 11.0*  HCT 35.0* 34.1*  MCV 82.5 87.9  PLT 286 572   Basic Metabolic Panel  Recent Labs  05/07/15 0835 05/07/15 1145 05/08/15 0541  NA 138 137  --   K 3.6 3.6  --   CL  101 98*  --   CO2 29 30  --   GLUCOSE 176* 258*  --   BUN 46* 49*  --   CREATININE 3.20* 3.20* 2.87*  CALCIUM 8.3* 8.2*  --   PHOS 4.0 4.0  --    Liver Function Tests  Recent Labs  05/07/15 0835 05/07/15 1145  ALBUMIN 2.7* 2.6*    TELE NSR with 1 transient episode of tachycardia    ECG  No new EKG  Echocardiogram 06/23/2014  LV EF: 50% -  55%  ------------------------------------------------------------------- Indications:   MI - acute 410.91.  ------------------------------------------------------------------- History:  PMH:  Coronary artery disease. Congestive heart failure. Chronic obstructive pulmonary disease. Risk factors: Hypertension. Diabetes mellitus. Dyslipidemia.  ------------------------------------------------------------------- Study  Conclusions  - Left ventricle: The cavity size was normal. There was moderate concentric hypertrophy. Systolic function was normal. The estimated ejection fraction was in the range of 50% to 55%. Wall motion was normal; there were no regional wall motion abnormalities. Doppler parameters are consistent with a reversible restrictive pattern, indicative of decreased left ventricular diastolic compliance and/or increased left atrial pressure (grade 3 diastolic dysfunction). - Aortic valve: Cusp separation was reduced. - Mitral valve: Moderately calcified annulus. - Left atrium: The atrium was mildly dilated.    Radiology/Studies  Dg Chest 2 View  04/26/2015  CLINICAL DATA:  Shortness of breath. EXAM: CHEST  2 VIEW COMPARISON:  04/24/2015. FINDINGS: Cardiomegaly with pulmonary vascular prominence and mild interstitial prominence, improved from prior exam. Findings consistent with improving congestive heart failure. Cardiomegaly remains severe. Underlying process such as pericardial effusion, cardiomyopathy, or cardiac valve disease cannot be excluded . No pleural effusion or pneumothorax . IMPRESSION: 1. Improving congestive heart failure. 2. Persistent severe cardiomegaly. Underlying process such as pericardial fusion, cardiomyopathy, cardiac valve disease cannot be excluded . Electronically Signed   By: Marcello Moores  Register   On: 04/26/2015 07:47   Dg Chest 2 View  04/24/2015  CLINICAL DATA:  Five day history of upper urinary tract infection. No improvement after antibiotics. Productive cough. EXAM: CHEST  2 VIEW COMPARISON:  04/19/2015 FINDINGS: The heart is enlarged but appears relatively stable. There is tortuosity and calcification of the thoracic aorta. Diffuse bilateral airspace process could reflect pulmonary edema or bilateral infiltrates. No definite pleural effusions. The bony thorax is intact. IMPRESSION: Cardiac enlargement. Diffuse airspace process could reflect pulmonary  edema or bilateral infiltrates. No definite pleural effusions. Electronically Signed   By: Marijo Sanes M.D.   On: 04/24/2015 17:16   Dg Chest 2 View  04/19/2015  CLINICAL DATA:  Cough for 1 week.  Wheezing for 2 days.  Congestion. EXAM: CHEST  2 VIEW COMPARISON:  03/06/2015 FINDINGS: There is cardiomegaly with vascular congestion and mild perihilar opacities concerning for early edema. No effusions. No acute bony abnormality. IMPRESSION: Findings concerning for mild CHF. Electronically Signed   By: Rolm Baptise M.D.   On: 04/19/2015 12:42   US Renal  05/01/2015  CLINICAL DATA:  Renal failure. EXAM: RENAL / URINARY TRACT ULTRASOUND COMPLETE COMPARISON:  CT, 12/05/2013 FINDINGS: Right Kidney: Length: 10.6 cm. Echogenicity within normal limits. No mass or hydronephrosis visualized. Left Kidney: Length: 10.3 cm. Echogenicity within normal limits. No mass or hydronephrosis visualized. Bladder: Decompressed with a Foley catheter IMPRESSION: Normal renal ultrasound. Electronically Signed   By: Lajean Manes M.D.   On: 05/01/2015 12:46   Ir Fluoro Guide Cv Line Right  05/02/2015  INDICATION: Acute on chronic renal insufficiency. Please place temporary dialysis catheter for the initiation of  dialysis. EXAM: NON-TUNNELED CENTRAL VENOUS HEMODIALYSIS CATHETER PLACEMENT WITH ULTRASOUND AND FLUOROSCOPIC GUIDANCE COMPARISON:  None. MEDICATIONS: None CONTRAST:  None FLUOROSCOPY TIME:  24 seconds (22.9 mGy) COMPLICATIONS: None immediate PROCEDURE: Informed written consent was obtained from the patient after a discussion of the risks, benefits, and alternatives to treatment. Questions regarding the procedure were encouraged and answered. The right neck and chest were prepped with chlorhexidine in a sterile fashion, and a sterile drape was applied covering the operative field. Maximum barrier sterile technique with sterile gowns and gloves were used for the procedure. A timeout was performed prior to the initiation of the  procedure. After creating a small venotomy incision, a micropuncture kit was utilized to access the right internal jugular vein under direct, real-time ultrasound guidance after the overlying soft tissues were anesthetized with 1% lidocaine with epinephrine. Ultrasound image documentation was performed. The microwire was kinked to measure appropriate catheter length. A stiff glidewire was advanced to the level of the IVC. Under fluoroscopic guidance, the venotomy was serially dilated, ultimately allowing placement of a 20 cm temporary Trialysis catheter with tip ultimately terminating within the superior aspect of the right atrium. Final catheter positioning was confirmed and documented with a spot radiographic image. The catheter aspirates and flushes normally. The catheter was flushed with appropriate volume heparin dwells. The catheter exit site was secured with a 0-Prolene retention suture. A dressing was placed. The patient tolerated the procedure well without immediate post procedural complication. IMPRESSION: Successful placement of a right internal jugular approach 20 cm temporary dialysis catheter with tip terminating with in the superior aspect of the right atrium. This catheter may be converted to a tunneled dialysis catheter at a later date as indicated. The catheter is ready for immediate use. Electronically Signed   By: Sandi Mariscal M.D.   On: 05/02/2015 09:53   Ir US Guide Vasc Access Right  05/02/2015  INDICATION: Acute on chronic renal insufficiency. Please place temporary dialysis catheter for the initiation of dialysis. EXAM: NON-TUNNELED CENTRAL VENOUS HEMODIALYSIS CATHETER PLACEMENT WITH ULTRASOUND AND FLUOROSCOPIC GUIDANCE COMPARISON:  None. MEDICATIONS: None CONTRAST:  None FLUOROSCOPY TIME:  24 seconds (79.8 mGy) COMPLICATIONS: None immediate PROCEDURE: Informed written consent was obtained from the patient after a discussion of the risks, benefits, and alternatives to treatment. Questions  regarding the procedure were encouraged and answered. The right neck and chest were prepped with chlorhexidine in a sterile fashion, and a sterile drape was applied covering the operative field. Maximum barrier sterile technique with sterile gowns and gloves were used for the procedure. A timeout was performed prior to the initiation of the procedure. After creating a small venotomy incision, a micropuncture kit was utilized to access the right internal jugular vein under direct, real-time ultrasound guidance after the overlying soft tissues were anesthetized with 1% lidocaine with epinephrine. Ultrasound image documentation was performed. The microwire was kinked to measure appropriate catheter length. A stiff glidewire was advanced to the level of the IVC. Under fluoroscopic guidance, the venotomy was serially dilated, ultimately allowing placement of a 20 cm temporary Trialysis catheter with tip ultimately terminating within the superior aspect of the right atrium. Final catheter positioning was confirmed and documented with a spot radiographic image. The catheter aspirates and flushes normally. The catheter was flushed with appropriate volume heparin dwells. The catheter exit site was secured with a 0-Prolene retention suture. A dressing was placed. The patient tolerated the procedure well without immediate post procedural complication. IMPRESSION: Successful placement of a right internal jugular approach  20 cm temporary dialysis catheter with tip terminating with in the superior aspect of the right atrium. This catheter may be converted to a tunneled dialysis catheter at a later date as indicated. The catheter is ready for immediate use. Electronically Signed   By: Sandi Mariscal M.D.   On: 05/02/2015 09:53   Dg Chest Port 1 View  05/03/2015  CLINICAL DATA:  Shortness of Breath EXAM: PORTABLE CHEST 1 VIEW COMPARISON:  04/28/2015 FINDINGS: Cardiomediastinal silhouette is stable. Dual lumen right IJ catheter  with tip in right atrium. Central mild vascular congestion and mild perihilar interstitial prominence suspicious for mild interstitial edema. No segmental infiltrate. IMPRESSION: Cardiomegaly again noted. Dual lumen right IJ catheter with tip in right atrium. Central mild vascular congestion and mild perihilar interstitial prominence suspicious for mild interstitial edema. Electronically Signed   By: Lahoma Crocker M.D.   On: 05/03/2015 11:36   Dg Chest Port 1 View  04/28/2015  CLINICAL DATA:  Cough, wheezing for 2 weeks. EXAM: PORTABLE CHEST 1 VIEW COMPARISON:  04/26/2015 FINDINGS: Cardiomegaly. Decreasing lung volumes with increasing perihilar and lower lobe airspace opacities concerning for CHF. No effusions. No acute bony abnormality. IMPRESSION: Cardiomegaly with increasing bilateral airspace disease, likely edema/ CHF. Low lung volumes. Electronically Signed   By: Rolm Baptise M.D.   On: 04/28/2015 11:11   Dg Swallowing Func-speech Pathology  05/03/2015  Objective Swallowing Evaluation:   MBS Patient Details Name: ROYCE SCIARA MRN: [id number] Date of Birth: Sep 27, 1929 Today's Date: 05/03/2015 Time: SLP Start Time (ACUTE ONLY): 1340-SLP Stop Time (ACUTE ONLY): 1400 SLP Time Calculation (min) (ACUTE ONLY): 20 min Past Medical History: Past Medical History Diagnosis Date . Uterine cancer ([place])  . Diabetes mellitus    type II; peripheral neuropathy . Hypertension  . GERD (gastroesophageal reflux disease)  . Peripheral vascular disease ([place])    a. s/p L BKA 08/2012. . Obesity  . Dyslipidemia  . Chronic combined systolic and diastolic CHF (congestive heart failure) (HCC)    a. EF 50-55%, mild LVH and grade 1 diast. Dysfxn b. Grade 3 Diastolic Dysfunction 17/5102. b. 02/2015: EF 45-50% by cath. . Osteoarthritis cervical spine  . DM retinopathy ([place])  . Sleep apnea    with CPAP . History of shingles  . COPD (chronic obstructive pulmonary disease) ([place])  . Depression  . Peptic ulcer disease    duodenal . Peptic  stricture of esophagus  . Hiatal hernia  . Diverticulosis  . Arthritis  . CAD (coronary artery disease)    a. s/p multiple caths with nonobs CAD;   b. cath 1/10: pLAD 20%, mLAD 40%, pCFX 20%, mCFX 40%, pRCA 60-70%;   c.  Myoview 06/02/12: Low anterior wall scar, no ischemia, EF 37%. d. cath 06/20/2014 70% mid LCx, medical therapy, high risk for PCI due to close proximity to large OM e. cath 03/07/15 pro RCA 50% & pro to mid Cx 75%, both stable, LVEDP 33-25mm Hg-> medical management . Cardiomyopathy ([place])    a. Echo 05/31/12: EF 40-45%. b. Echo 06/2014: EF 50-55%. c. Cath 02/2015: EF 45-50%. . Asthma    Mild . Pruritic condition    Idiopathic . Zoster  . Noncompliance  . CKD (chronic kidney disease), stage III    Dr. Lorrene Reid . Bradycardia    a. Not on BB due to this. . Shortness of breath dyspnea  Past Surgical History: Past Surgical History Procedure Laterality Date . Tubal ligation  1967 . Knee arthroscopy  10/1998   Left . Craniotomy  1997   Left for SDH . Cataract extraction, bilateral  2005 . Hernia repair   . Esophagogastroduodenoscopy  04/04/2004 . Spine surgery     C-spine and lumbar surgery . Cholecystectomy  2010 . Cardiac catheterization   . Dexa  7/05 . Amputation Left 09/04/2012   Procedure: AMPUTATION BELOW KNEE;  Surgeon: Angelia Mould, MD;  Location: [place];  Service: Vascular;  Laterality: Left; . Abdominal angiogram N/A 08/31/2012   Procedure: ABDOMINAL ANGIOGRAM with runoff poss intervention;  Surgeon: Angelia Mould, MD;  Location: [facility] CATH LAB;  Service: Cardiovascular;  Laterality: N/A; . Tubal ligation   . Left heart catheterization with coronary angiogram N/A 06/22/2014   Procedure: LEFT HEART CATHETERIZATION WITH CORONARY ANGIOGRAM;  Surgeon: Burnell Blanks, MD;  Location: [facility] CATH LAB;  Service: Cardiovascular;  Laterality: N/A; . Eye surgery   . Brain surgery     1997 blood clot on the brain, then had to relieve fluid on the brain . Multiple extractions with alveoloplasty N/A  08/01/2014   Procedure: EXTRACTIONS OF TEETH NUMBERS $RemoveBefor'7 8 9 10 11 '[id number]$ AND 19 AND ALVEOLOPLASTY UPPER LEFT AND RIGHT QUADRANT;  Surgeon: Isac Caddy, DDS;  Location: [place];  Service: Oral Surgery;  Laterality: N/A; . Cardiac catheterization N/A 03/07/2015   Procedure: Left Heart Cath and Coronary Angiography;  Surgeon: Leonie Man, MD;  Location: [place] CV LAB;  Service: Cardiovascular;  Laterality: N/A; HPI: ZANARIA MORELL is an 81 y.o. female patient with history of chronic combined CHF (EF 45-50 percent & grade 3 diastolic dysfunction by [facility] 02/2015), CAD, DM 2, HTN, HLD, OSA on nightly CPAP, PAD status post left BKA, stage III CKD, COPD/? Asthma, bradycardia (hence not on beta blockers), recent hospitalization 9/19-9/22 for unstable angina at which time cath showed stable anatomy, now presented to [facility] ED on 04/24/15 with worsening dyspnea, orthopnea, productive cough, pleuritic appearing chest pain but no fevers. Recently seen by PCP and started on azithromycin for suspected pneumonia & Lasix dose increased. Noncompliant with fluid and water restriction. Admitted for decompensated CHF & question of pneumonia. Per MD notes, on 11/11 patient began to c/o coughing while eating. MBS recommended after swallow session this am.  No Data Recorded Assessment / Plan / Recommendation CHL IP CLINICAL IMPRESSIONS 05/03/2015 Therapy Diagnosis Mild pharyngeal phase dysphagia Clinical Impression Mild pharyngeal dysphagia as evidenced by delayed swallow initiation to pyriform sinuses due to decreased pharyngeal sensation. Mild pyriform sinus residue due to decreased laryngeal elevation cleared with verbal cues for second swallow. Pt coughed during MBS without barium observed in laryngeal vestibule during study. Recommend pt continue regular texture diet, thin liquids, straws allowed, small sips and bites, pills with thin (one at a time). Follow up x 1 for education.     CHL IP TREATMENT RECOMMENDATION 05/03/2015  Treatment Recommendations Therapy as outlined in treatment plan below   CHL IP DIET RECOMMENDATION 05/03/2015 SLP Diet Recommendations Regular solids;Thin liquid Liquid Administration via Cup;Straw Medication Administration Whole meds with liquid Compensations Slow rate;Small sips/bites;Multiple dry swallows after each bite/sip Postural Changes Seated upright at 90 degrees   CHL IP OTHER RECOMMENDATIONS 05/03/2015 Recommended Consults -- Oral Care Recommendations Oral care BID Other Recommendations --   CHL IP FOLLOW UP RECOMMENDATIONS 05/03/2015 Follow up Recommendations None   CHL IP FREQUENCY AND DURATION 05/03/2015 Speech Therapy Frequency (ACUTE ONLY) min 1 x/week Treatment Duration 1 week      CHL IP ORAL PHASE 05/03/2015 Oral Phase WFL Oral - Pudding Teaspoon --  Oral - Pudding Cup -- Oral - Honey Teaspoon -- Oral - Honey Cup -- Oral - Nectar Teaspoon -- Oral - Nectar Cup -- Oral - Nectar Straw -- Oral - Thin Teaspoon -- Oral - Thin Cup -- Oral - Thin Straw -- Oral - Puree -- Oral - Mech Soft -- Oral - Regular -- Oral - Multi-Consistency -- Oral - Pill -- Oral Phase - Comment --  CHL IP PHARYNGEAL PHASE 05/03/2015 Pharyngeal Phase Thin;Solids Pharyngeal- Pudding Teaspoon -- Pharyngeal -- Pharyngeal- Pudding Cup -- Pharyngeal -- Pharyngeal- Honey Teaspoon -- Pharyngeal -- Pharyngeal- Honey Cup -- Pharyngeal -- Pharyngeal- Nectar Teaspoon -- Pharyngeal -- Pharyngeal- Nectar Cup -- Pharyngeal -- Pharyngeal- Nectar Straw -- Pharyngeal -- Pharyngeal- Thin Teaspoon NT Pharyngeal -- Pharyngeal- Thin Cup Delayed swallow initiation-pyriform sinuses;Pharyngeal residue - pyriform;Reduced laryngeal elevation Pharyngeal -- Pharyngeal- Thin Straw Delayed swallow initiation-pyriform sinuses;Pharyngeal residue - pyriform;Reduced laryngeal elevation Pharyngeal -- Pharyngeal- Puree Delayed swallow initiation-vallecula;Pharyngeal residue - pyriform;Reduced laryngeal elevation Pharyngeal -- Pharyngeal- Mechanical Soft NT  Pharyngeal -- Pharyngeal- Regular Delayed swallow initiation-vallecula Pharyngeal -- Pharyngeal- Multi-consistency NT Pharyngeal -- Pharyngeal- Pill Delayed swallow initiation-vallecula Pharyngeal -- Pharyngeal Comment --  CHL IP CERVICAL ESOPHAGEAL PHASE 05/03/2015 Cervical Esophageal Phase WFL Pudding Teaspoon -- Pudding Cup -- Honey Teaspoon -- Honey Cup -- Nectar Teaspoon -- Nectar Cup -- Nectar Straw -- Thin Teaspoon -- Thin Cup -- Thin Straw -- Puree -- Mechanical Soft -- Regular -- Multi-consistency -- Pill -- Cervical Esophageal Comment -- Houston Siren 05/03/2015, 4:07 PM  Orbie Pyo Colvin Caroli.Ed CCC-SLP Pager [phone]              ASSESSMENT AND PLAN  81 y.o. female with past medical history significant for diabetes mellitus, hypertension, hyperlipidemia, obesity, OSA on CPAP, COPD, coronary artery disease with also a mild cardiomyopathy EF in the 40s, PAD status post BKA as well as CKD appearing to have a creatinine of between 1.6 and 2.0 and is followed by Dr. Lorrene Reid at [facility]. Last cath 02/2015 non obstructed disease. There was mild left ventricular systolic dysfunction. Severely elevated LVEDP of 33-38 mmHg suggestive of significant diastolic heart failure. Unfortunately, her Cr went up significantly after diuresis, despite holding further lasix, her renal function continued to deteriorate requiring dialysis  1. Acute on chronic diastolic HF - fluid overloaded with declining renal function requiring temp dialysis. - breathing better after dialysis yesterday, IV lasix stopped this morning. It appears she only put out about 646ml of urine yesterday, dialyzed 3L. Currently I/O -7.2 L. Weight decreased from 197 lbs on arrival to 178 lbs today. Has some rhonchi on exam, but clearing up with cough. Need to monitor for sign of bronchitis and PNA  - apparently still making urine per pt. Cr 2.8 this morning, slightly improved. VVS planning for AVF  placement this Wednesday.   2. Junctional rhythm - had junctional bradycardia after BB, beta blocker stopped. Short burst of tachycardia last night, but given recent bradycardia, would not add any BB  3. HCAP: per IM  4. Acute on chronic renal insufficiency: felt to be ATN per nephrology. Undergoing temp dialysis. - BP stable with HD. If BP remain elevated on non-dialysis days, will consider add hydralazine.   5. NSVT: off BB due to persistent junctional rhythm   6. CAD: nonobstructive on cath 03/07/2015 EF 45-50%  Signed, Almyra Deforest PA-C Pager: [phone]  Patient seen, examined. Available data reviewed. Agree with findings, assessment, and plan as outlined by Almyra Deforest, PA-C. This is an  elderly woman in no distress. Multiple family members at bedside. Lungs with diffuse rhonchi. Heart is regular rate and rhythm. There is no pretibial edema on the right. Patient with acute on chronic diastolic heart failure and now with end-stage renal disease. Plans for permanent access on Wednesday with vascular surgery. Family members updated at bedside. No change in therapies at present. We'll continue to follow her heart rhythm. Agree with avoiding AV nodal blockers in the setting of periodic junctional bradycardia.  Sherren Mocha, M.D. 05/08/2015 12:49 PM

## 2015-05-09 ENCOUNTER — Other Ambulatory Visit: Payer: Self-pay | Admitting: Endocrinology

## 2015-05-09 LAB — CBC
HCT: 35.9 % — ABNORMAL LOW (ref 36.0–46.0)
HEMOGLOBIN: 11 g/dL — AB (ref 12.0–15.0)
MCH: 25.9 pg — AB (ref 26.0–34.0)
MCHC: 30.6 g/dL (ref 30.0–36.0)
MCV: 84.5 fL (ref 78.0–100.0)
Platelets: 263 10*3/uL (ref 150–400)
RBC: 4.25 MIL/uL (ref 3.87–5.11)
RDW: 19 % — ABNORMAL HIGH (ref 11.5–15.5)
WBC: 13.1 10*3/uL — AB (ref 4.0–10.5)

## 2015-05-09 LAB — URIC ACID: Uric Acid, Serum: 4.6 mg/dL (ref 2.3–6.6)

## 2015-05-09 LAB — SURGICAL PCR SCREEN
MRSA, PCR: NEGATIVE
Staphylococcus aureus: NEGATIVE

## 2015-05-09 LAB — RENAL FUNCTION PANEL
ANION GAP: 9 (ref 5–15)
Albumin: 2.8 g/dL — ABNORMAL LOW (ref 3.5–5.0)
BUN: 57 mg/dL — ABNORMAL HIGH (ref 6–20)
CHLORIDE: 98 mmol/L — AB (ref 101–111)
CO2: 29 mmol/L (ref 22–32)
Calcium: 8.5 mg/dL — ABNORMAL LOW (ref 8.9–10.3)
Creatinine, Ser: 3.6 mg/dL — ABNORMAL HIGH (ref 0.44–1.00)
GFR, EST AFRICAN AMERICAN: 12 mL/min — AB (ref >60)
GFR, EST NON AFRICAN AMERICAN: 11 mL/min — AB (ref >60)
Glucose, Bld: 202 mg/dL — ABNORMAL HIGH (ref 65–99)
POTASSIUM: 3.7 mmol/L (ref 3.5–5.1)
Phosphorus: 3.9 mg/dL (ref 2.5–4.6)
Sodium: 136 mmol/L (ref 135–145)

## 2015-05-09 LAB — IRON AND TIBC
Iron: 52 ug/dL (ref 28–170)
SATURATION RATIOS: 16 % (ref 10.4–31.8)
TIBC: 319 ug/dL (ref 250–450)
UIBC: 267 ug/dL

## 2015-05-09 LAB — GLUCOSE, CAPILLARY
GLUCOSE-CAPILLARY: 156 mg/dL — AB (ref 65–99)
GLUCOSE-CAPILLARY: 111 mg/dL — AB (ref 65–99)
Glucose-Capillary: 217 mg/dL — ABNORMAL HIGH (ref 65–99)
Glucose-Capillary: 106 mg/dL — ABNORMAL HIGH (ref 65–99)

## 2015-05-09 MED ORDER — HEPARIN SODIUM (PORCINE) 1000 UNIT/ML DIALYSIS: 1000.0000 [IU] | INTRAMUSCULAR | Status: DC | PRN

## 2015-05-09 MED ORDER — SODIUM CHLORIDE 0.9 % IV SOLN: 100.0000 mL | INTRAVENOUS | Status: DC | PRN

## 2015-05-09 MED ORDER — LIDOCAINE-PRILOCAINE 2.5-2.5 % EX CREA
1.0000 "application " | TOPICAL_CREAM | CUTANEOUS | Status: DC | PRN
  Filled 2015-05-09: qty 5

## 2015-05-09 MED ORDER — PENTAFLUOROPROP-TETRAFLUOROETH EX AERO: 1.0000 "application " | INHALATION_SPRAY | CUTANEOUS | Status: DC | PRN

## 2015-05-09 MED ORDER — HEPARIN SODIUM (PORCINE) 1000 UNIT/ML DIALYSIS
100.0000 [IU]/kg | INTRAMUSCULAR | Status: DC | PRN
  Filled 2015-05-09: qty 9

## 2015-05-09 MED ORDER — LIDOCAINE HCL (PF) 1 % IJ SOLN: 5.0000 mL | INTRAMUSCULAR | Status: DC | PRN

## 2015-05-09 MED ORDER — CALCITRIOL 0.5 MCG PO CAPS
ORAL_CAPSULE | ORAL | Status: AC
  Administered 2015-05-09: 0.5 ug via ORAL
  Filled 2015-05-09: qty 1

## 2015-05-09 MED ORDER — ALTEPLASE 2 MG IJ SOLR
2.0000 mg | Freq: Once | INTRAMUSCULAR | Status: DC | PRN
  Filled 2015-05-09: qty 2

## 2015-05-09 NOTE — Progress Notes (Signed)
Nutrition Follow-up  DOCUMENTATION CODES:   Obesity unspecified  INTERVENTION:  Continue Glucerna Shake po BID, each supplement provides 220 kcal and 10 grams of protein Continue Snacks BID   NUTRITION DIAGNOSIS:   Inadequate oral intake related to poor appetite, other (see comment) (dry mouth) as evidenced by per patient/family report, percent weight loss.  Ongoing  GOAL:   Patient will meet greater than or equal to 90% of their needs  Unmet  MONITOR:   PO intake, Supplement acceptance, Weight trends, Labs, I & O's  REASON FOR ASSESSMENT:   LOS    ASSESSMENT:   81 y.o. female point chronic kidney disease stage III, coronary artery disease, chronic systolic and diastolic heart failure, obstructive sleep apnea, peripheral vascular disease status post left BKA, type 2 diabetes mellitus on insulin presents to the hospital for shortness of breath and cough and is found to have pulmonary edema.   Per nursing notes, pt's po intake has improved and she is eating 25-100% of meals, 50-75% of most. Pt states that her appetite remains poor and she is eating 25% of most meals. Pt states she has been receiving and eating snacks, but family states she has not. Her weight has dropped from 188 lbs to 166 lbs in the past 5 days. Pt states that she used to maintain 215 lbs but, she has had fluid/edema for a long time.  RD encouraged PO intake with intake of Glucerna Shakes and snacks to supplement meals.   Labs: elevated glucose, elevated BUN/creatinine, low calcium, low albumin; phosphorus, sodium, and potassium are WNL  Diet Order:  Diet NPO time specified Except for: Sips with Meds Diet renal/carb modified with fluid restriction Diet-HS Snack?: Nothing; Room service appropriate?: Yes; Fluid consistency:: Thin  Skin:  Reviewed, no issues  Last BM:  11/21  Height:   Ht Readings from Last 1 Encounters:  04/24/15 5\' 6"  (1.676 m)    Weight:   Wt Readings from Last 1 Encounters:   05/09/15 166 lb 7.2 oz (75.5 kg)    Ideal Body Weight:  55.3 kg  BMI:  Body mass index is 26.88 kg/(m^2).  Estimated Nutritional Needs:   Kcal:  1700-2000  Protein:  75-85 grams  Fluid:  1.7 L/day  EDUCATION NEEDS:   No education needs identified at this time  [place], LDN Inpatient Clinical Dietitian Pager: [phone] After Hours Pager: [phone]

## 2015-05-09 NOTE — Progress Notes (Signed)
Patient Name: [name] Date of Encounter: 05/09/2015  Primary Cardiologist: Dr. Meda Coffee   Principal Problem:   Acute respiratory failure with hypoxia [facility]) Active Problems:   DM (diabetes mellitus), type 2, uncontrolled, with renal complications ([place])   Hyperlipidemia   Essential hypertension   Coronary artery disease due to lipid rich plaque; 70% ostial-proximal AV Groove Cx - not good PCI target   Acute on chronic diastolic heart failure (HCC)   HCAP (healthcare-associated pneumonia)   NSVT (nonsustained ventricular tachycardia) (HCC)   Acute on chronic congestive heart failure (HCC)   AKI (acute kidney injury) ([place])   Acute kidney injury superimposed on chronic kidney disease ([place])    SUBJECTIVE  Breathing good, denies any CP or SOB. Slept ok last night.   CURRENT MEDS . allopurinol  100 mg Oral Daily  . aspirin EC  81 mg Oral Daily  . atorvastatin  40 mg Oral q1800  . calcitRIOL  0.25 mcg Oral Daily  . [START ON 05/10/2015] cefUROXime (ZINACEF)  IV  1.5 g Intravenous To SS-Surg  . enoxaparin (LOVENOX) injection  30 mg Subcutaneous QHS  . feeding supplement (GLUCERNA SHAKE)  237 mL Oral BID PC  . gabapentin  300 mg Oral QHS  . insulin aspart  0-15 Units Subcutaneous TID WC  . insulin aspart  0-5 Units Subcutaneous QHS  . insulin aspart  5 Units Subcutaneous TID WC  . insulin detemir  16 Units Subcutaneous QHS  . isosorbide mononitrate  60 mg Oral Daily  . latanoprost  1 drop Both Eyes QHS  . mometasone-formoterol  2 puff Inhalation BID  . multivitamin with minerals  1 tablet Oral Daily  . pantoprazole  40 mg Oral BID  . ranolazine  500 mg Oral BID  . sertraline  100 mg Oral Daily  . sodium chloride  3 mL Intravenous Q12H    OBJECTIVE  Filed Vitals:   05/08/15 1105 05/08/15 1938 05/08/15 1954 05/09/15 0429  BP: 133/47  131/58 129/47  Pulse: 70  78 74  Temp: 98 F (36.7 C)  98.6 F (37 C) 98.7 F (37.1 C)  TempSrc: Oral  Oral Oral  Resp: _0 Height:      Weight:    179 lb 7.3 oz (81.4 kg)  SpO2: 100% 92% 100% 98%    Intake/Output Summary (Last 24 hours) at 05/09/15 0749 Last data filed at 05/09/15 9826  Gross per 24 hour  Intake   1087 ml  Output    151 ml  Net    936 ml   Filed Weights   05/07/15 0443 05/08/15 0717 05/09/15 0429  Weight: 179 lb 0.2 oz (81.2 kg) 178 lb 12.8 oz (81.103 kg) 179 lb 7.3 oz (81.4 kg)    PHYSICAL EXAM  General: Pleasant, NAD. Neuro: Alert and oriented X 3. Moves all extremities spontaneously. Psych: Normal affect. HEENT:  Normal  Neck: Supple without bruits. Lungs:  Resp regular and unlabored. Largely CTA, mild R lateral basilar rale   Heart: RRR no s3, s4, or murmurs. Abdomen: Soft, non-tender, non-distended, BS + x 4.  Extremities: No clubbing, cyanosis or edema. DP/PT/Radials 2+ and equal bilaterally.  Accessory Clinical Findings  CBC  Recent Labs  05/07/15 1145  WBC 12.2*  HGB 11.0*  HCT 34.1*  MCV 87.9  PLT 415   Basic Metabolic Panel  Recent Labs  05/07/15 0835 05/07/15 1145 05/08/15 0541  NA 138 137  --   K 3.6 3.6  --  CL 101 98*  --   CO2 29 30  --   GLUCOSE 176* 258*  --   BUN 46* 49*  --   CREATININE 3.20* 3.20* 2.87*  CALCIUM 8.3* 8.2*  --   PHOS 4.0 4.0  --    Liver Function Tests  Recent Labs  05/07/15 0835 05/07/15 1145  ALBUMIN 2.7* 2.6*    TELE NSR with 1 transient episode of tachycardia yesterday    ECG  No new EKG  Echocardiogram 06/23/2014  LV EF: 50% -  55%  ------------------------------------------------------------------- Indications:   MI - acute 410.91.  ------------------------------------------------------------------- History:  PMH:  Coronary artery disease. Congestive heart failure. Chronic obstructive pulmonary disease. Risk factors: Hypertension. Diabetes mellitus. Dyslipidemia.  ------------------------------------------------------------------- Study Conclusions  - Left ventricle: The  cavity size was normal. There was moderate concentric hypertrophy. Systolic function was normal. The estimated ejection fraction was in the range of 50% to 55%. Wall motion was normal; there were no regional wall motion abnormalities. Doppler parameters are consistent with a reversible restrictive pattern, indicative of decreased left ventricular diastolic compliance and/or increased left atrial pressure (grade 3 diastolic dysfunction). - Aortic valve: Cusp separation was reduced. - Mitral valve: Moderately calcified annulus. - Left atrium: The atrium was mildly dilated.    Radiology/Studies  Dg Chest 2 View  04/26/2015  CLINICAL DATA:  Shortness of breath. EXAM: CHEST  2 VIEW COMPARISON:  04/24/2015. FINDINGS: Cardiomegaly with pulmonary vascular prominence and mild interstitial prominence, improved from prior exam. Findings consistent with improving congestive heart failure. Cardiomegaly remains severe. Underlying process such as pericardial effusion, cardiomyopathy, or cardiac valve disease cannot be excluded . No pleural effusion or pneumothorax . IMPRESSION: 1. Improving congestive heart failure. 2. Persistent severe cardiomegaly. Underlying process such as pericardial fusion, cardiomyopathy, cardiac valve disease cannot be excluded . Electronically Signed   By: Marcello Moores  Register   On: 04/26/2015 07:47   Dg Chest 2 View  04/24/2015  CLINICAL DATA:  Five day history of upper urinary tract infection. No improvement after antibiotics. Productive cough. EXAM: CHEST  2 VIEW COMPARISON:  04/19/2015 FINDINGS: The heart is enlarged but appears relatively stable. There is tortuosity and calcification of the thoracic aorta. Diffuse bilateral airspace process could reflect pulmonary edema or bilateral infiltrates. No definite pleural effusions. The bony thorax is intact. IMPRESSION: Cardiac enlargement. Diffuse airspace process could reflect pulmonary edema or bilateral infiltrates. No  definite pleural effusions. Electronically Signed   By: Marijo Sanes M.D.   On: 04/24/2015 17:16   Dg Chest 2 View  04/19/2015  CLINICAL DATA:  Cough for 1 week.  Wheezing for 2 days.  Congestion. EXAM: CHEST  2 VIEW COMPARISON:  03/06/2015 FINDINGS: There is cardiomegaly with vascular congestion and mild perihilar opacities concerning for early edema. No effusions. No acute bony abnormality. IMPRESSION: Findings concerning for mild CHF. Electronically Signed   By: Rolm Baptise M.D.   On: 04/19/2015 12:42   US Renal  05/01/2015  CLINICAL DATA:  Renal failure. EXAM: RENAL / URINARY TRACT ULTRASOUND COMPLETE COMPARISON:  CT, 12/05/2013 FINDINGS: Right Kidney: Length: 10.6 cm. Echogenicity within normal limits. No mass or hydronephrosis visualized. Left Kidney: Length: 10.3 cm. Echogenicity within normal limits. No mass or hydronephrosis visualized. Bladder: Decompressed with a Foley catheter IMPRESSION: Normal renal ultrasound. Electronically Signed   By: Lajean Manes M.D.   On: 05/01/2015 12:46   Ir Fluoro Guide Cv Line Right  05/02/2015  INDICATION: Acute on chronic renal insufficiency. Please place temporary dialysis catheter for the  initiation of dialysis. EXAM: NON-TUNNELED CENTRAL VENOUS HEMODIALYSIS CATHETER PLACEMENT WITH ULTRASOUND AND FLUOROSCOPIC GUIDANCE COMPARISON:  None. MEDICATIONS: None CONTRAST:  None FLUOROSCOPY TIME:  24 seconds (35.4 mGy) COMPLICATIONS: None immediate PROCEDURE: Informed written consent was obtained from the patient after a discussion of the risks, benefits, and alternatives to treatment. Questions regarding the procedure were encouraged and answered. The right neck and chest were prepped with chlorhexidine in a sterile fashion, and a sterile drape was applied covering the operative field. Maximum barrier sterile technique with sterile gowns and gloves were used for the procedure. A timeout was performed prior to the initiation of the procedure. After creating a small  venotomy incision, a micropuncture kit was utilized to access the right internal jugular vein under direct, real-time ultrasound guidance after the overlying soft tissues were anesthetized with 1% lidocaine with epinephrine. Ultrasound image documentation was performed. The microwire was kinked to measure appropriate catheter length. A stiff glidewire was advanced to the level of the IVC. Under fluoroscopic guidance, the venotomy was serially dilated, ultimately allowing placement of a 20 cm temporary Trialysis catheter with tip ultimately terminating within the superior aspect of the right atrium. Final catheter positioning was confirmed and documented with a spot radiographic image. The catheter aspirates and flushes normally. The catheter was flushed with appropriate volume heparin dwells. The catheter exit site was secured with a 0-Prolene retention suture. A dressing was placed. The patient tolerated the procedure well without immediate post procedural complication. IMPRESSION: Successful placement of a right internal jugular approach 20 cm temporary dialysis catheter with tip terminating with in the superior aspect of the right atrium. This catheter may be converted to a tunneled dialysis catheter at a later date as indicated. The catheter is ready for immediate use. Electronically Signed   By: Sandi Mariscal M.D.   On: 05/02/2015 09:53   Ir US Guide Vasc Access Right  05/02/2015  INDICATION: Acute on chronic renal insufficiency. Please place temporary dialysis catheter for the initiation of dialysis. EXAM: NON-TUNNELED CENTRAL VENOUS HEMODIALYSIS CATHETER PLACEMENT WITH ULTRASOUND AND FLUOROSCOPIC GUIDANCE COMPARISON:  None. MEDICATIONS: None CONTRAST:  None FLUOROSCOPY TIME:  24 seconds (56.2 mGy) COMPLICATIONS: None immediate PROCEDURE: Informed written consent was obtained from the patient after a discussion of the risks, benefits, and alternatives to treatment. Questions regarding the procedure were  encouraged and answered. The right neck and chest were prepped with chlorhexidine in a sterile fashion, and a sterile drape was applied covering the operative field. Maximum barrier sterile technique with sterile gowns and gloves were used for the procedure. A timeout was performed prior to the initiation of the procedure. After creating a small venotomy incision, a micropuncture kit was utilized to access the right internal jugular vein under direct, real-time ultrasound guidance after the overlying soft tissues were anesthetized with 1% lidocaine with epinephrine. Ultrasound image documentation was performed. The microwire was kinked to measure appropriate catheter length. A stiff glidewire was advanced to the level of the IVC. Under fluoroscopic guidance, the venotomy was serially dilated, ultimately allowing placement of a 20 cm temporary Trialysis catheter with tip ultimately terminating within the superior aspect of the right atrium. Final catheter positioning was confirmed and documented with a spot radiographic image. The catheter aspirates and flushes normally. The catheter was flushed with appropriate volume heparin dwells. The catheter exit site was secured with a 0-Prolene retention suture. A dressing was placed. The patient tolerated the procedure well without immediate post procedural complication. IMPRESSION: Successful placement of a right internal  jugular approach 20 cm temporary dialysis catheter with tip terminating with in the superior aspect of the right atrium. This catheter may be converted to a tunneled dialysis catheter at a later date as indicated. The catheter is ready for immediate use. Electronically Signed   By: Sandi Mariscal M.D.   On: 05/02/2015 09:53   Dg Chest Port 1 View  05/03/2015  CLINICAL DATA:  Shortness of Breath EXAM: PORTABLE CHEST 1 VIEW COMPARISON:  04/28/2015 FINDINGS: Cardiomediastinal silhouette is stable. Dual lumen right IJ catheter with tip in right atrium.  Central mild vascular congestion and mild perihilar interstitial prominence suspicious for mild interstitial edema. No segmental infiltrate. IMPRESSION: Cardiomegaly again noted. Dual lumen right IJ catheter with tip in right atrium. Central mild vascular congestion and mild perihilar interstitial prominence suspicious for mild interstitial edema. Electronically Signed   By: Lahoma Crocker M.D.   On: 05/03/2015 11:36   Dg Chest Port 1 View  04/28/2015  CLINICAL DATA:  Cough, wheezing for 2 weeks. EXAM: PORTABLE CHEST 1 VIEW COMPARISON:  04/26/2015 FINDINGS: Cardiomegaly. Decreasing lung volumes with increasing perihilar and lower lobe airspace opacities concerning for CHF. No effusions. No acute bony abnormality. IMPRESSION: Cardiomegaly with increasing bilateral airspace disease, likely edema/ CHF. Low lung volumes. Electronically Signed   By: Rolm Baptise M.D.   On: 04/28/2015 11:11   Dg Swallowing Func-speech Pathology  05/03/2015  Objective Swallowing Evaluation:   MBS Patient Details Name: RYLIN SAEZ MRN: [id number] Date of Birth: 04/17/1930 Today's Date: 05/03/2015 Time: SLP Start Time (ACUTE ONLY): 1340-SLP Stop Time (ACUTE ONLY): 1400 SLP Time Calculation (min) (ACUTE ONLY): 20 min Past Medical History: Past Medical History Diagnosis Date . Uterine cancer ([place])  . Diabetes mellitus    type II; peripheral neuropathy . Hypertension  . GERD (gastroesophageal reflux disease)  . Peripheral vascular disease ([place])    a. s/p L BKA 08/2012. . Obesity  . Dyslipidemia  . Chronic combined systolic and diastolic CHF (congestive heart failure) (HCC)    a. EF 50-55%, mild LVH and grade 1 diast. Dysfxn b. Grade 3 Diastolic Dysfunction 56/4332. b. 02/2015: EF 45-50% by cath. . Osteoarthritis cervical spine  . DM retinopathy ([place])  . Sleep apnea    with CPAP . History of shingles  . COPD (chronic obstructive pulmonary disease) ([place])  . Depression  . Peptic ulcer disease    duodenal . Peptic stricture of esophagus  .  Hiatal hernia  . Diverticulosis  . Arthritis  . CAD (coronary artery disease)    a. s/p multiple caths with nonobs CAD;   b. cath 1/10: pLAD 20%, mLAD 40%, pCFX 20%, mCFX 40%, pRCA 60-70%;   c.  Myoview 06/02/12: Low anterior wall scar, no ischemia, EF 37%. d. cath 06/20/2014 70% mid LCx, medical therapy, high risk for PCI due to close proximity to large OM e. cath 03/07/15 pro RCA 50% & pro to mid Cx 75%, both stable, LVEDP 33-16m Hg-> medical management . Cardiomyopathy ([place]    a. Echo 05/31/12: EF 40-45%. b. Echo 06/2014: EF 50-55%. c. Cath 02/2015: EF 45-50%. . Asthma    Mild . Pruritic condition    Idiopathic . Zoster  . Noncompliance  . CKD (chronic kidney disease), stage III    Dr. DLorrene Reid. Bradycardia    a. Not on BB due to this. . Shortness of breath dyspnea  Past Surgical History: Past Surgical History Procedure Laterality Date . Tubal ligation  1967 . Knee arthroscopy  10/1998   Left .  Craniotomy  1997   Left for SDH . Cataract extraction, bilateral  2005 . Hernia repair   . Esophagogastroduodenoscopy  04/04/2004 . Spine surgery     C-spine and lumbar surgery . Cholecystectomy  2010 . Cardiac catheterization   . Dexa  7/05 . Amputation Left 09/04/2012   Procedure: AMPUTATION BELOW KNEE;  Surgeon: Angelia Mould, MD;  Location: [place];  Service: Vascular;  Laterality: Left; . Abdominal angiogram N/A 08/31/2012   Procedure: ABDOMINAL ANGIOGRAM with runoff poss intervention;  Surgeon: Angelia Mould, MD;  Location[facility] C[facility] LAB;  Service: Cardiovascular;  Laterality: N/A; . Tubal ligation   . Left heart catheterization with coronary angiogram N/A 06/22/2014   Procedure: LEFT HEART CATHETERIZATION WITH CORONARY ANGIOGRAM;  Surgeon: Burnell Blanks, MD;  Location: [facility] CATH LAB;  Service: Cardiovascular;  Laterality: N/A; . Eye surgery   . Brain surgery     1997 blood clot on the brain, then had to relieve fluid on the brain . Multiple extractions with alveoloplasty N/A 08/01/2014   Procedure:  EXTRACTIONS OF TEETH NUMBERS _0 AND 19 AND ALVEOLOPLASTY UPPER LEFT AND RIGHT QUADRANT;  Surgeon: Isac Caddy, DDS;  Location: [place];  Service: Oral Surgery;  Laterality: N/A; . Cardiac catheterization N/A 03/07/2015   Procedure: Left Heart Cath and Coronary Angiography;  Surgeon: Leonie Man, MD;  Location: [place] CV LAB;  Service: Cardiovascular;  Laterality: N/A; HPI: KAYLYNN CHAMBLIN is an 81 y.o. female patient with history of chronic combined CHF (EF 45-50 percent & grade 3 diastolic dysfunction by [facility] 02/2015), CAD, DM 2, HTN, HLD, OSA on nightly CPAP, PAD status post left BKA, stage III CKD, COPD/? Asthma, bradycardia (hence not on beta blockers), recent hospitalization 9/19-9/22 for unstable angina at which time cath showed stable anatomy, now presented to [facility] ED on 04/24/15 with worsening dyspnea, orthopnea, productive cough, pleuritic appearing chest pain but no fevers. Recently seen by PCP and started on azithromycin for suspected pneumonia & Lasix dose increased. Noncompliant with fluid and water restriction. Admitted for decompensated CHF & question of pneumonia. Per MD notes, on 11/11 patient began to c/o coughing while eating. MBS recommended after swallow session this am.  No Data Recorded Assessment / Plan / Recommendation CHL IP CLINICAL IMPRESSIONS 05/03/2015 Therapy Diagnosis Mild pharyngeal phase dysphagia Clinical Impression Mild pharyngeal dysphagia as evidenced by delayed swallow initiation to pyriform sinuses due to decreased pharyngeal sensation. Mild pyriform sinus residue due to decreased laryngeal elevation cleared with verbal cues for second swallow. Pt coughed during MBS without barium observed in laryngeal vestibule during study. Recommend pt continue regular texture diet, thin liquids, straws allowed, small sips and bites, pills with thin (one at a time). Follow up x 1 for education.     CHL IP TREATMENT RECOMMENDATION 05/03/2015 Treatment Recommendations  Therapy as outlined in treatment plan below   CHL IP DIET RECOMMENDATION 05/03/2015 SLP Diet Recommendations Regular solids;Thin liquid Liquid Administration via Cup;Straw Medication Administration Whole meds with liquid Compensations Slow rate;Small sips/bites;Multiple dry swallows after each bite/sip Postural Changes Seated upright at 90 degrees   CHL IP OTHER RECOMMENDATIONS 05/03/2015 Recommended Consults -- Oral Care Recommendations Oral care BID Other Recommendations --   CHL IP FOLLOW UP RECOMMENDATIONS 05/03/2015 Follow up Recommendations None   CHL IP FREQUENCY AND DURATION 05/03/2015 Speech Therapy Frequency (ACUTE ONLY) min 1 x/week Treatment Duration 1 week      CHL IP ORAL PHASE 05/03/2015 Oral Phase WFL Oral - Pudding  Teaspoon -- Oral - Pudding Cup -- Oral - Honey Teaspoon -- Oral - Honey Cup -- Oral - Nectar Teaspoon -- Oral - Nectar Cup -- Oral - Nectar Straw -- Oral - Thin Teaspoon -- Oral - Thin Cup -- Oral - Thin Straw -- Oral - Puree -- Oral - Mech Soft -- Oral - Regular -- Oral - Multi-Consistency -- Oral - Pill -- Oral Phase - Comment --  CHL IP PHARYNGEAL PHASE 05/03/2015 Pharyngeal Phase Thin;Solids Pharyngeal- Pudding Teaspoon -- Pharyngeal -- Pharyngeal- Pudding Cup -- Pharyngeal -- Pharyngeal- Honey Teaspoon -- Pharyngeal -- Pharyngeal- Honey Cup -- Pharyngeal -- Pharyngeal- Nectar Teaspoon -- Pharyngeal -- Pharyngeal- Nectar Cup -- Pharyngeal -- Pharyngeal- Nectar Straw -- Pharyngeal -- Pharyngeal- Thin Teaspoon NT Pharyngeal -- Pharyngeal- Thin Cup Delayed swallow initiation-pyriform sinuses;Pharyngeal residue - pyriform;Reduced laryngeal elevation Pharyngeal -- Pharyngeal- Thin Straw Delayed swallow initiation-pyriform sinuses;Pharyngeal residue - pyriform;Reduced laryngeal elevation Pharyngeal -- Pharyngeal- Puree Delayed swallow initiation-vallecula;Pharyngeal residue - pyriform;Reduced laryngeal elevation Pharyngeal -- Pharyngeal- Mechanical Soft NT Pharyngeal -- Pharyngeal- Regular  Delayed swallow initiation-vallecula Pharyngeal -- Pharyngeal- Multi-consistency NT Pharyngeal -- Pharyngeal- Pill Delayed swallow initiation-vallecula Pharyngeal -- Pharyngeal Comment --  CHL IP CERVICAL ESOPHAGEAL PHASE 05/03/2015 Cervical Esophageal Phase WFL Pudding Teaspoon -- Pudding Cup -- Honey Teaspoon -- Honey Cup -- Nectar Teaspoon -- Nectar Cup -- Nectar Straw -- Thin Teaspoon -- Thin Cup -- Thin Straw -- Puree -- Mechanical Soft -- Regular -- Multi-consistency -- Pill -- Cervical Esophageal Comment -- Houston Siren 05/03/2015, 4:07 PM  Orbie Pyo Colvin Caroli.Ed CCC-SLP Pager [phone]              ASSESSMENT AND PLAN  81 y.o. female with past medical history significant for diabetes mellitus, hypertension, hyperlipidemia, obesity, OSA on CPAP, COPD, coronary artery disease with also a mild cardiomyopathy EF in the 40s, PAD status post BKA as well as CKD appearing to have a creatinine of between 1.6 and 2.0 and is followed by Dr. Lorrene Reid at [facility]. Last cath 02/2015 non obstructed disease. There was mild left ventricular systolic dysfunction. Severely elevated LVEDP of 33-38 mmHg suggestive of significant diastolic heart failure. Unfortunately, her Cr went up significantly after diuresis, despite holding further lasix, her renal function continued to deteriorate requiring dialysis  1. Acute on chronic diastolic HF - fluid overloaded with declining renal function requiring temp dialysis.  - still coughing up thick phlegm, however lung sounds better than yesterday, mild rale in R lateral base likely atelectasis  - apparently still making urine per pt. Urinary output still not ideal. VVS planning for AVF placement this Wednesday. PT recommended SNF, stable from cardiac perspective to go to SNF after recover from surgery.   - SBP 120-130s, which is a good BP for her, will continue 9m Imdur, will hold off on adding hydralazine to prevent drop in BP with  dialysis.   2. Junctional rhythm - had junctional bradycardia after BB, beta blocker stopped. Short burst of tachycardia last night, but given recent bradycardia, would not add any BB  3. HCAP: per IM  4. Acute on chronic renal insufficiency: felt to be ATN per nephrology. Undergoing temp dialysis. - BP stable with HD   5. NSVT: off BB due to persistent junctional rhythm  6. CAD: nonobstructive on cath 03/07/2015 EF 45-50%  Signed, MAlmyra DeforestPA-C Pager: [phone] Patient seen, examined. Available data reviewed. Agree with findings, assessment, and plan as outlined by HAlmyra Deforest PA-C. Pt independently interviewed and examined. Lungs CTA,  heart RRR, exam otherwise as outlined. For permanent dialysis access tomorrow. Volume management via dialysis. Not much else to add from cardiac perspective. Please call if any acute issues arise. thx  Sherren Mocha, M.D. 05/09/2015 3:58 PM

## 2015-05-09 NOTE — Progress Notes (Signed)
[facility] Progress Note   [name]  [id number]  DOB: 1930-02-18  DOA: 04/24/2015 PCP: Renato Shin, MD  Brief narrative: [name] is a 81 y.o. female  point chronic kidney disease stage III, coronary artery disease, chronic systolic and diastolic heart failure, obstructive sleep apnea, peripheral vascular disease status post left BKA, type 2 diabetes mellitus on insulin presents to the hospital for shortness of breath and cough and is found to have pulmonary edema. She was recently treated with Zithromax and increased dose of Lasix by her PCP however continued to be short of breath at rest. She was started on diuretics after admission however creatinine rose steadily without much results and diuresis. Nephrology consult was requested and it was decided for her to undergo dialysis to help remove excess fluid.   Subjective: No new complaints. Still has not gotten out of bed to chair.   Assessment/Plan: Principal Problem:   Acute respiratory failure with hypoxia -  Acute on chronic diastolic heart failure- AKI - significant rise in creatinine with diuresis - dialysis started on 11/15 - vein mapping done- left arm fistula scheduled for Wed  Active Problems: HCAP? - has had a cough with congestion for 2 wks now - CXR obtained after fluid has been dialyzed off does not reveal infiltrates consistent with pneumonia - initially received Vancomycin and Ceftaz and then transitioned to Levaquin- cough unfortunately not improving with antibiotics- completed 14 days  - of note the patient never had leukocytosis or fevers - SLP eval- D 3 diet with thin liquids  Junctional rhythm/ NSVT - junctional rhythm resolved with holding B Blocker - management per cardiology   DM (diabetes mellitus), type 2, uncontrolled, with renal complications   - uses 0000000 insulin at home at a dose of 14 U only at breakfast - has been started on Levemir and Novolog in the hospital -  she was hypoglycemic on 11/17- Levemir dosage decreased- sugars improved - have increased Novolog as post meal sugars still elevated  Hyperlipidemia -cont statin + zetia   Coronary artery disease due to lipid rich plaque; 70% ostial-proximal AV Groove Cx - not good PCI target -cont Imdur, Ranexa, statin and Aspirin  Anemia of chronic disease - stable   Code Status:     Code Status Orders        Start     Ordered   04/24/15 2353  Full code   Continuous     04/24/15 2354     Family Communication:  Disposition Plan: SNF recommended by PT DVT prophylaxis: heparin Consultants:cardiology, nephrology Procedures:   Antibiotics: Anti-infectives    Start     Dose/Rate Route Frequency Ordered Stop   05/10/15 0600  cefUROXime (ZINACEF) 1.5 g in dextrose 5 % 50 mL IVPB     1.5 g 100 mL/hr over 30 Minutes Intravenous To ShortStay Surgical 05/08/15 0821 05/11/15 0600   05/02/15 1000  levofloxacin (LEVAQUIN) IVPB 500 mg  Status:  Discontinued     500 mg 100 mL/hr over 60 Minutes Intravenous Every 48 hours 04/30/15 0940 05/05/15 1024   04/30/15 1200  vancomycin (VANCOCIN) IVPB 1000 mg/200 mL premix  Status:  Discontinued     1,000 mg 200 mL/hr over 60 Minutes Intravenous Every 48 hours 04/28/15 1254 04/30/15 0918   04/30/15 1100  levofloxacin (LEVAQUIN) IVPB 500 mg  Status:  Discontinued     500 mg 100 mL/hr over 60 Minutes Intravenous Every 48 hours 04/28/15 1052 04/29/15 0956   04/30/15 1000  levofloxacin (LEVAQUIN) IVPB 750 mg     750 mg 100 mL/hr over 90 Minutes Intravenous  Once 04/30/15 0944 04/30/15 1206   04/28/15 1100  levofloxacin (LEVAQUIN) IVPB 750 mg     750 mg 100 mL/hr over 90 Minutes Intravenous  Once 04/28/15 1034 04/28/15 1830   04/28/15 1100  cefTAZidime (FORTAZ) 2 g in dextrose 5 % 50 mL IVPB  Status:  Discontinued     2 g 100 mL/hr over 30 Minutes Intravenous Every 24 hours 04/28/15 1037 04/30/15 0940   04/28/15 1100  vancomycin (VANCOCIN) IVPB 1000 mg/200  mL premix     1,000 mg 200 mL/hr over 60 Minutes Intravenous  Once 04/28/15 1048 04/28/15 1408   04/25/15 2100  vancomycin (VANCOCIN) IVPB 1000 mg/200 mL premix  Status:  Discontinued     1,000 mg 200 mL/hr over 60 Minutes Intravenous Every 24 hours 04/24/15 2032 04/26/15 1302   04/25/15 2030  cefTAZidime (FORTAZ) 2 g in dextrose 5 % 50 mL IVPB  Status:  Discontinued     2 g 100 mL/hr over 30 Minutes Intravenous Every 24 hours 04/24/15 2032 04/26/15 1302   04/25/15 0000  cefTAZidime (FORTAZ) 2 g in dextrose 5 % 50 mL IVPB  Status:  Discontinued     2 g 100 mL/hr over 30 Minutes Intravenous 3 times per day 04/24/15 2354 04/25/15 0018   04/24/15 2030  cefTAZidime (FORTAZ) 2 g in dextrose 5 % 50 mL IVPB  Status:  Discontinued     2 g 100 mL/hr over 30 Minutes Intravenous  Once 04/24/15 2025 04/24/15 2032   04/24/15 2030  vancomycin (VANCOCIN) IVPB 1000 mg/200 mL premix  Status:  Discontinued     1,000 mg 200 mL/hr over 60 Minutes Intravenous  Once 04/24/15 2025 04/24/15 2032      Objective: Filed Weights   05/08/15 0717 05/09/15 0429 05/09/15 0843  Weight: 81.103 kg (178 lb 12.8 oz) 81.4 kg (179 lb 7.3 oz) 75.5 kg (166 lb 7.2 oz)    Intake/Output Summary (Last 24 hours) at 05/09/15 1155 Last data filed at 05/09/15 0903  Gross per 24 hour  Intake    847 ml  Output    151 ml  Net    696 ml     Vitals Filed Vitals:   05/09/15 0904 05/09/15 0930 05/09/15 1000 05/09/15 1030  BP: 117/58 130/67 119/67 136/50  Pulse: 67 68 66 78  Temp:      TempSrc:      Resp:      Height:      Weight:      SpO2:        Exam:  General:  Pt is alert, not in acute distress  HEENT: No icterus, No thrush, oral mucosa moist  Cardiovascular: regular rate and rhythm, S1/S2 No murmur  Respiratory: CTA b/l  Abdomen: Soft, +Bowel sounds, non tender, non distended, no guarding  MSK: No LE edema, cyanosis or clubbing  Data Reviewed: Basic Metabolic Panel:  Recent Labs Lab 05/04/15 0900  05/05/15 1300 05/07/15 0835 05/07/15 1145 05/08/15 0541 05/09/15 0941  NA 141 141 138 137  --  136  K 3.2* 3.2* 3.6 3.6  --  3.7  CL 98* 99* 101 98*  --  98*  CO2 29 32 29 30  --  29  GLUCOSE 124* 110* 176* 258*  --  202*  BUN 92* 38* 46* 49*  --  57*  CREATININE 5.12* 2.65* 3.20* 3.20* 2.87* 3.60*  CALCIUM 8.3*  8.1* 8.3* 8.2*  --  8.5*  PHOS 6.0* 2.8 4.0 4.0  --  3.9   Liver Function Tests:  Recent Labs Lab 05/04/15 0900 05/05/15 1300 05/07/15 0835 05/07/15 1145 05/09/15 0941  ALBUMIN 2.9* 2.5* 2.7* 2.6* 2.8*   No results for input(s): LIPASE, AMYLASE in the last 168 hours. No results for input(s): AMMONIA in the last 168 hours. CBC:  Recent Labs Lab 05/03/15 0540 05/04/15 0900 05/05/15 1300 05/07/15 1145 05/09/15 0940  WBC 7.1 11.7* 10.1 12.2* 13.1*  NEUTROABS  --  9.7*  --   --   --   HGB 10.4* 10.3* 10.9* 11.0* 11.0*  HCT 32.4* 30.6* 35.0* 34.1* 35.9*  MCV 80.4 86.0 82.5 87.9 84.5  PLT 269 292 286 279 263   Cardiac Enzymes: No results for input(s): CKTOTAL, CKMB, CKMBINDEX, TROPONINI in the last 168 hours. BNP (last 3 results)  Recent Labs  06/17/14 1330 06/17/14 1946 04/24/15 1738  BNP 1951.5* 1985.7* 1594.7*    ProBNP (last 3 results)  Recent Labs  06/08/14 1617  PROBNP 1249.0*    CBG:  Recent Labs Lab 05/08/15 0552 05/08/15 1104 05/08/15 1635 05/08/15 2124 05/09/15 0558  GLUCAP 155* 209* 168* 130* 217*    Recent Results (from the past 240 hour(s))  Urine culture     Status: None   Collection Time: 04/30/15  3:38 PM  Result Value Ref Range Status   Specimen Description URINE, CATHETERIZED  Final   Special Requests NONE  Final   Culture NO GROWTH 1 DAY  Final   Report Status 05/01/2015 FINAL  Final     Studies: No results found.  Scheduled Meds:  Scheduled Meds: . allopurinol  100 mg Oral Daily  . aspirin EC  81 mg Oral Daily  . atorvastatin  40 mg Oral q1800  . calcitRIOL  0.25 mcg Oral Daily  . [START ON  05/10/2015] cefUROXime (ZINACEF)  IV  1.5 g Intravenous To SS-Surg  . enoxaparin (LOVENOX) injection  30 mg Subcutaneous QHS  . feeding supplement (GLUCERNA SHAKE)  237 mL Oral BID PC  . gabapentin  300 mg Oral QHS  . insulin aspart  0-15 Units Subcutaneous TID WC  . insulin aspart  0-5 Units Subcutaneous QHS  . insulin aspart  5 Units Subcutaneous TID WC  . insulin detemir  16 Units Subcutaneous QHS  . isosorbide mononitrate  60 mg Oral Daily  . latanoprost  1 drop Both Eyes QHS  . mometasone-formoterol  2 puff Inhalation BID  . multivitamin with minerals  1 tablet Oral Daily  . pantoprazole  40 mg Oral BID  . ranolazine  500 mg Oral BID  . sertraline  100 mg Oral Daily  . sodium chloride  3 mL Intravenous Q12H   Continuous Infusions:   Time spent on care of this patient: 35 min   [place], MD 05/09/2015, 11:55 AM  LOS: 15 days   [facility] Office  [phone] Pager - Text Page per [email or web address] If 7PM-7AM, please contact night-coverage [email or web address]

## 2015-05-09 NOTE — Progress Notes (Signed)
Subjective: Interval History: has no complaint, just waiting for her access3.  Objective: Vital signs in last 24 hours: Temp:  [98 F (36.7 C)-98.7 F (37.1 C)] 98.3 F (36.8 C) (11/22 0843) Pulse Rate:  [67-78] 68 (11/22 0930) Resp:  [18-20] 20 (11/22 0843) BP: (117-137)/(47-67) 130/67 mmHg (11/22 0930) SpO2:  [92 %-100 %] 100 % (11/22 0843) Weight:  [75.5 kg (166 lb 7.2 oz)-81.4 kg (179 lb 7.3 oz)] 75.5 kg (166 lb 7.2 oz) (11/22 0843) Weight change:   Intake/Output from previous day: 11/21 0701 - 11/22 0700 In: 1327 [P.O.:1327] Out: 151 [Urine:150; Stool:1] Intake/Output this shift: Total I/O In: 120 [P.O.:120] Out: 0   General appearance: cooperative, moderately obese and slowed mentation Resp: diminished breath sounds bilaterally Chest wall: RIJ PC Cardio: S1, S2 normal and systolic murmur: holosystolic 2/6, blowing at apex GI: obese,pos bs, soft Extremities: L AKA  Lab Results:  Recent Labs  05/07/15 1145 05/09/15 0940  WBC 12.2* 13.1*  HGB 11.0* 11.0*  HCT 34.1* 35.9*  PLT 279 263   BMET:  Recent Labs  05/07/15 0835 05/07/15 1145 05/08/15 0541  NA 138 137  --   K 3.6 3.6  --   CL 101 98*  --   CO2 29 30  --   GLUCOSE 176* 258*  --   BUN 46* 49*  --   CREATININE 3.20* 3.20* 2.87*  CALCIUM 8.3* 8.2*  --    No results for input(s): PTH in the last 72 hours. Iron Studies: No results for input(s): IRON, TIBC, TRANSFERRIN, FERRITIN in the last 72 hours.  Studies/Results: No results found.  I have reviewed the patient's current medications.  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 Has some function.  Vol better. Slowly lower 2 HTN not an issue 3 Anemia stable 4 HPTH level pending 5 DM fair control 6 OSA 7 Obesity 8 SVT in past 9^ lipids 10 CAD P Access, HD today. Check PTH , limit # meds   LOS: 15 days   Anna-Marie Coller L 05/09/2015,10:11 AM

## 2015-05-10 ENCOUNTER — Encounter (HOSPITAL_COMMUNITY)
Admission: EM | Disposition: A | Discharge status: Skilled Nursing Facility | Payer: Self-pay | Source: Home / Self Care | Attending: Internal Medicine

## 2015-05-10 ENCOUNTER — Other Ambulatory Visit: Payer: Self-pay | Admitting: *Deleted

## 2015-05-10 ENCOUNTER — Telehealth: Payer: Self-pay | Admitting: Vascular Surgery

## 2015-05-10 ENCOUNTER — Inpatient Hospital Stay (HOSPITAL_COMMUNITY): Payer: Medicare Other | Admitting: Anesthesiology

## 2015-05-10 ENCOUNTER — Encounter (HOSPITAL_COMMUNITY): Payer: Self-pay | Admitting: Anesthesiology

## 2015-05-10 DIAGNOSIS — N186 End stage renal disease: Secondary | ICD-10-CM

## 2015-05-10 DIAGNOSIS — Z4931 Encounter for adequacy testing for hemodialysis: Secondary | ICD-10-CM

## 2015-05-10 HISTORY — PX: AV FISTULA PLACEMENT: SHX1204

## 2015-05-10 LAB — RENAL FUNCTION PANEL
ANION GAP: 12 (ref 5–15)
Albumin: 3 g/dL — ABNORMAL LOW (ref 3.5–5.0)
BUN: 26 mg/dL — ABNORMAL HIGH (ref 6–20)
CHLORIDE: 97 mmol/L — AB (ref 101–111)
CO2: 28 mmol/L (ref 22–32)
CREATININE: 2.72 mg/dL — AB (ref 0.44–1.00)
Calcium: 8.4 mg/dL — ABNORMAL LOW (ref 8.9–10.3)
GFR calc non Af Amer: 15 mL/min — ABNORMAL LOW (ref >60)
GFR, EST AFRICAN AMERICAN: 18 mL/min — AB (ref >60)
Glucose, Bld: 96 mg/dL (ref 65–99)
POTASSIUM: 3.8 mmol/L (ref 3.5–5.1)
Phosphorus: 3.9 mg/dL (ref 2.5–4.6)
Sodium: 137 mmol/L (ref 135–145)

## 2015-05-10 LAB — BASIC METABOLIC PANEL
ANION GAP: 11 (ref 5–15)
BUN: 26 mg/dL — AB (ref 6–20)
CALCIUM: 8.3 mg/dL — AB (ref 8.9–10.3)
CO2: 28 mmol/L (ref 22–32)
Chloride: 97 mmol/L — ABNORMAL LOW (ref 101–111)
Creatinine, Ser: 2.69 mg/dL — ABNORMAL HIGH (ref 0.44–1.00)
GFR calc Af Amer: 18 mL/min — ABNORMAL LOW (ref >60)
GFR, EST NON AFRICAN AMERICAN: 15 mL/min — AB (ref >60)
GLUCOSE: 95 mg/dL (ref 65–99)
Potassium: 3.8 mmol/L (ref 3.5–5.1)
SODIUM: 136 mmol/L (ref 135–145)

## 2015-05-10 LAB — CBC
HEMATOCRIT: 37.7 % (ref 36.0–46.0)
Hemoglobin: 11.5 g/dL — ABNORMAL LOW (ref 12.0–15.0)
MCH: 25.5 pg — AB (ref 26.0–34.0)
MCHC: 30.5 g/dL (ref 30.0–36.0)
MCV: 83.6 fL (ref 78.0–100.0)
PLATELETS: 241 10*3/uL (ref 150–400)
RBC: 4.51 MIL/uL (ref 3.87–5.11)
RDW: 19.3 % — ABNORMAL HIGH (ref 11.5–15.5)
WBC: 15 10*3/uL — AB (ref 4.0–10.5)

## 2015-05-10 LAB — GLUCOSE, CAPILLARY
GLUCOSE-CAPILLARY: 89 mg/dL (ref 65–99)
GLUCOSE-CAPILLARY: 92 mg/dL (ref 65–99)
GLUCOSE-CAPILLARY: 102 mg/dL — AB (ref 65–99)
GLUCOSE-CAPILLARY: 93 mg/dL (ref 65–99)
GLUCOSE-CAPILLARY: 216 mg/dL — AB (ref 65–99)
Glucose-Capillary: 135 mg/dL — ABNORMAL HIGH (ref 65–99)

## 2015-05-10 LAB — PARATHYROID HORMONE, INTACT (NO CA): PTH: 207 pg/mL — AB (ref 15–65)

## 2015-05-10 SURGERY — ARTERIOVENOUS (AV) FISTULA CREATION
Anesthesia: Monitor Anesthesia Care | Site: Arm Upper | Laterality: Left

## 2015-05-10 MED ORDER — FENTANYL CITRATE (PF) 100 MCG/2ML IJ SOLN: 25.0000 ug | INTRAMUSCULAR | Status: DC | PRN

## 2015-05-10 MED ORDER — EZETIMIBE 10 MG PO TABS
10.0000 mg | ORAL_TABLET | Freq: Every morning | ORAL | Status: DC
  Administered 2015-05-10 – 2015-05-15 (×6): 10 mg via ORAL
  Filled 2015-05-10 (×6): qty 1

## 2015-05-10 MED ORDER — COLLAGENASE 250 UNIT/GM EX OINT
TOPICAL_OINTMENT | Freq: Every day | CUTANEOUS | Status: DC
  Administered 2015-05-10 – 2015-05-15 (×6): via TOPICAL
  Filled 2015-05-10: qty 30

## 2015-05-10 MED ORDER — LIDOCAINE HCL (PF) 1 % IJ SOLN
INTRAMUSCULAR | Status: AC
  Filled 2015-05-10: qty 30

## 2015-05-10 MED ORDER — 0.9 % SODIUM CHLORIDE (POUR BTL) OPTIME
TOPICAL | Status: DC | PRN
Start: 2015-05-10 — End: 2015-05-10
  Administered 2015-05-10: 1000 mL

## 2015-05-10 MED ORDER — HEPARIN SODIUM (PORCINE) 1000 UNIT/ML IJ SOLN
INTRAMUSCULAR | Status: AC
  Filled 2015-05-10: qty 1

## 2015-05-10 MED ORDER — SODIUM CHLORIDE 0.9 % IV SOLN
INTRAVENOUS | Status: DC | PRN
  Administered 2015-05-10: 500 mL

## 2015-05-10 MED ORDER — ONDANSETRON HCL 4 MG/2ML IJ SOLN
INTRAMUSCULAR | Status: DC | PRN
  Administered 2015-05-10: 4 mg via INTRAVENOUS

## 2015-05-10 MED ORDER — LIDOCAINE HCL (PF) 1 % IJ SOLN
INTRAMUSCULAR | Status: DC | PRN
  Administered 2015-05-10: 11 mL

## 2015-05-10 MED ORDER — FENTANYL CITRATE (PF) 250 MCG/5ML IJ SOLN
INTRAMUSCULAR | Status: AC
  Filled 2015-05-10: qty 5

## 2015-05-10 MED ORDER — AMLODIPINE BESYLATE 10 MG PO TABS
10.0000 mg | ORAL_TABLET | Freq: Every day | ORAL | Status: DC
  Administered 2015-05-10 – 2015-05-11 (×2): 10 mg via ORAL
  Filled 2015-05-10 (×2): qty 1

## 2015-05-10 MED ORDER — SODIUM CHLORIDE 0.9 % IV SOLN
INTRAVENOUS | Status: DC | PRN
  Administered 2015-05-10: 09:00:00 via INTRAVENOUS

## 2015-05-10 MED ORDER — MEPERIDINE HCL 25 MG/ML IJ SOLN: 6.2500 mg | INTRAMUSCULAR | Status: DC | PRN

## 2015-05-10 MED ORDER — HEPARIN SODIUM (PORCINE) 1000 UNIT/ML IJ SOLN
INTRAMUSCULAR | Status: DC | PRN
  Administered 2015-05-10: 5000 [IU] via INTRAVENOUS

## 2015-05-10 MED ORDER — THROMBIN 20000 UNITS EX SOLR
CUTANEOUS | Status: AC
  Filled 2015-05-10: qty 20000

## 2015-05-10 MED ORDER — FENTANYL CITRATE (PF) 100 MCG/2ML IJ SOLN
INTRAMUSCULAR | Status: DC | PRN
  Administered 2015-05-10 (×2): 50 ug via INTRAVENOUS
  Administered 2015-05-10: 25 ug via INTRAVENOUS
  Administered 2015-05-10: 50 ug via INTRAVENOUS
  Administered 2015-05-10: 25 ug via INTRAVENOUS
  Administered 2015-05-10: 50 ug via INTRAVENOUS

## 2015-05-10 MED ORDER — NA FERRIC GLUC CPLX IN SUCROSE 12.5 MG/ML IV SOLN
125.0000 mg | INTRAVENOUS | Status: DC
  Administered 2015-05-12: 125 mg via INTRAVENOUS
  Filled 2015-05-10 (×3): qty 10

## 2015-05-10 SURGICAL SUPPLY — 35 items
ADH SKN CLS APL DERMABOND .7 (GAUZE/BANDAGES/DRESSINGS) ×1
ARMBAND PINK RESTRICT EXTREMIT (MISCELLANEOUS) ×2 IMPLANT
CANISTER SUCTION 2500CC (MISCELLANEOUS) ×2 IMPLANT
CANNULA VESSEL 3MM 2 BLNT TIP (CANNULA) ×2 IMPLANT
CLIP TI MEDIUM 6 (CLIP) ×2 IMPLANT
CLIP TI WIDE RED SMALL 6 (CLIP) ×2 IMPLANT
COVER PROBE W GEL 5X96 (DRAPES) ×1 IMPLANT
DECANTER SPIKE VIAL GLASS SM (MISCELLANEOUS) ×2 IMPLANT
DERMABOND ADVANCED (GAUZE/BANDAGES/DRESSINGS) ×1
DERMABOND ADVANCED .7 DNX12 (GAUZE/BANDAGES/DRESSINGS) IMPLANT
DRAIN PENROSE 1/4X12 LTX STRL (WOUND CARE) ×2 IMPLANT
ELECT REM PT RETURN 9FT ADLT (ELECTROSURGICAL) ×2
ELECTRODE REM PT RTRN 9FT ADLT (ELECTROSURGICAL) ×1 IMPLANT
GLOVE BIO SURGEON STRL SZ7.5 (GLOVE) ×2 IMPLANT
GLOVE BIOGEL M 6.5 STRL (GLOVE) ×2 IMPLANT
GLOVE BIOGEL PI IND STRL 6.5 (GLOVE) IMPLANT
GLOVE BIOGEL PI INDICATOR 6.5 (GLOVE) ×3
GLOVE ECLIPSE 6.5 STRL STRAW (GLOVE) ×1 IMPLANT
GOWN STRL REUS W/ TWL LRG LVL3 (GOWN DISPOSABLE) ×3 IMPLANT
GOWN STRL REUS W/TWL LRG LVL3 (GOWN DISPOSABLE) ×6
KIT BASIN OR (CUSTOM PROCEDURE TRAY) ×2 IMPLANT
KIT ROOM TURNOVER OR (KITS) ×2 IMPLANT
LIQUID BAND (GAUZE/BANDAGES/DRESSINGS) ×2 IMPLANT
LOOP VESSEL MINI RED (MISCELLANEOUS) IMPLANT
NS IRRIG 1000ML POUR BTL (IV SOLUTION) ×2 IMPLANT
PACK CV ACCESS (CUSTOM PROCEDURE TRAY) ×2 IMPLANT
PAD ARMBOARD 7.5X6 YLW CONV (MISCELLANEOUS) ×4 IMPLANT
SPONGE SURGIFOAM ABS GEL 100 (HEMOSTASIS) IMPLANT
SUT PROLENE 6 0 BV (SUTURE) ×1 IMPLANT
SUT PROLENE 7 0 BV 1 (SUTURE) ×2 IMPLANT
SUT VIC AB 3-0 SH 27 (SUTURE) ×2
SUT VIC AB 3-0 SH 27X BRD (SUTURE) ×1 IMPLANT
SUT VICRYL 4-0 PS2 18IN ABS (SUTURE) ×2 IMPLANT
UNDERPAD 30X30 INCONTINENT (UNDERPADS AND DIAPERS) ×2 IMPLANT
WATER STERILE IRR 1000ML POUR (IV SOLUTION) ×2 IMPLANT

## 2015-05-10 NOTE — H&P (View-Only) (Signed)
Pt for left arm AV fistula on Wednesday, Nov 21. Risks benefits complications and procedure details discussed.   Pt wishes to proceed.  Ruta Hinds, MD [facility] Office: [phone] Pager: [phone]

## 2015-05-10 NOTE — Anesthesia Postprocedure Evaluation (Signed)
Anesthesia Post Note  Patient: ALTAMAE LIPKA  Procedure(s) Performed: Procedure(s) (LRB): CREATION OF LEFT UPPER ARM ARTERIOVENOUS (AV) FISTULA  (Left)  Patient location during evaluation: PACU Anesthesia Type: General Level of consciousness: awake and alert Pain management: pain level controlled Vital Signs Assessment: post-procedure vital signs reviewed and stable Respiratory status: spontaneous breathing, nonlabored ventilation, respiratory function stable and patient connected to nasal cannula oxygen Cardiovascular status: blood pressure returned to baseline and stable Postop Assessment: No signs of nausea or vomiting Anesthetic complications: no    Last Vitals:  Filed Vitals:   05/10/15 1209 05/10/15 1212  BP: 113/56 112/56  Pulse: 78 76  Temp: 36.8 C 36.4 C  Resp: 18     Last Pain:  Filed Vitals:   05/10/15 1218  PainSc: 0-No pain                 Fatuma Dowers A

## 2015-05-10 NOTE — Progress Notes (Signed)
CSW received authorization number for SNF placement from [facility]  [id number]- number is good for 7 days.  CSW spoke with admissions at [facility]- they will not accept any admissions tomorrow as it is Thanksgiving.  CSW notified Dr. Verlon Au- if stable- will plan d/c to [facility] on Friday 05/12/15.  Lorie Phenix. Pauline Good, [place]

## 2015-05-10 NOTE — Telephone Encounter (Signed)
-----   Message from Mena Goes, RN sent at 05/10/2015  1:41 PM EST ----- Regarding: schedule    ----- Message -----    From: Gabriel Earing, PA-C    Sent: 05/10/2015  11:19 AM      To: [facility] Charge Pool  S/p left BC AVF 05/10/15.  F/u with Dr. Oneida Alar in 6 weeks with duplex.  Thanks, Aldona Bar

## 2015-05-10 NOTE — Progress Notes (Signed)
[facility] Progress Note   [name]  [id number]  DOB: 26-Jul-1929  DOA: 04/24/2015 PCP: Renato Shin, MD  Brief narrative: 81 y/o ?  chronic kidney disease stage III,  coronary artery disease-NSTEAMI 06/2014-recent cath 02/2015 neg PVD s/p L BKA 0000000 chronic systolic and diastolic heart failure EF 45-50% by [facility] 02/2015,  obstructive sleep apnea,/COPD>?  peripheral vascular disease status post left BKA,  type 2 diabetes mellitus on insulin presents to the hospital for shortness of breath and cough and is found to have pulmonary edema.  Chr Stg 2 pressure ulcer R buttocks  She was recently treated with Zithromax and increased dose of Lasix by her PCP however continued to be short of breath at rest. She was started on diuretics after admission however creatinine rose steadily without much results and diuresis. Nephrology consult was requested and it was decided for her to undergo dialysis to help remove excess fluid.  Underwent creation L Brachio-ceph AV fistula 05/10/15   Subjective: Doing fair post-op Family in room Had a soft diet No chills nor fevers No n/v   Assessment/Plan: Principal Problem:   Acute respiratory failure with hypoxia -  Acute on chronic diastolic heart failure- AKI - significant rise in creatinine with diuresis - dialysis started on 11/15 - vein mapping done- left arm brachio-ceph fistula done 05/10/15  Active Problems: HCAP? - has had a cough with congestion for 2 wks now - CXR obtained after fluid has been dialyzed off does not reveal infiltrates consistent with pneumonia - initially received Vancomycin and Ceftaz and then transitioned to Levaquin- cough unfortunately not improving with antibiotics- completed 14 days  - of note the patient never had leukocytosis or fevers - SLP eval- D 3 diet with thin liquids -added flutter vavle on 11/24 as she feels a sensation of needing to bring up sputum but none forthecoming  Junctional  rhythm/ NSVT - junctional rhythm resolved with holding B-Blocker - management per cardiology, they signed off on 11/22  DM (diabetes mellitus), type 2, uncontrolled, with renal complications   + neuropathy - uses 70/30 insulin at home at a dose of 14 U only at breakfast - has been started on Levemir 16 U and Novolog 5 U c Meals plus mealtime supplmental coverage in the hospital - she was hypoglycemic on 11/17- Levemir dosage decreased- sugars improved -Continue Gabapentin 300 daily  Hyperlipidemia -cont atorvastatin 40 and   Coronary artery disease due to lipid rich plaque; 70% ostial-proximal AV Groove Cx - not good PCI target -cont Imdur, Ranexa, statin and Aspirin  Anemia of chronic disease - stable -continue Nulecit 125 c mwf dialysis   Code Status:     Code Status Orders        Start     Ordered   04/24/15 2353  Full code   Continuous     04/24/15 2354     Family Communication:  Disposition Plan: SNF recommended-she needs dialysis and CLIP prior to d/c which will probably be 11.25.16 DVT prophylaxis: heparin Consultants:cardiology, nephrology Procedures:   Antibiotics: Anti-infectives    Start     Dose/Rate Route Frequency Ordered Stop   05/10/15 0600  cefUROXime (ZINACEF) 1.5 g in dextrose 5 % 50 mL IVPB     1.5 g 100 mL/hr over 30 Minutes Intravenous To ShortStay Surgical 05/08/15 0821 05/10/15 1015   05/02/15 1000  levofloxacin (LEVAQUIN) IVPB 500 mg  Status:  Discontinued     500 mg 100 mL/hr over 60 Minutes Intravenous Every 48 hours  04/30/15 0940 05/05/15 1024   04/30/15 1200  vancomycin (VANCOCIN) IVPB 1000 mg/200 mL premix  Status:  Discontinued     1,000 mg 200 mL/hr over 60 Minutes Intravenous Every 48 hours 04/28/15 1254 04/30/15 0918   04/30/15 1100  levofloxacin (LEVAQUIN) IVPB 500 mg  Status:  Discontinued     500 mg 100 mL/hr over 60 Minutes Intravenous Every 48 hours 04/28/15 1052 04/29/15 0956   04/30/15 1000  levofloxacin (LEVAQUIN) IVPB  750 mg     750 mg 100 mL/hr over 90 Minutes Intravenous  Once 04/30/15 0944 04/30/15 1206   04/28/15 1100  levofloxacin (LEVAQUIN) IVPB 750 mg     750 mg 100 mL/hr over 90 Minutes Intravenous  Once 04/28/15 1034 04/28/15 1830   04/28/15 1100  cefTAZidime (FORTAZ) 2 g in dextrose 5 % 50 mL IVPB  Status:  Discontinued     2 g 100 mL/hr over 30 Minutes Intravenous Every 24 hours 04/28/15 1037 04/30/15 0940   04/28/15 1100  vancomycin (VANCOCIN) IVPB 1000 mg/200 mL premix     1,000 mg 200 mL/hr over 60 Minutes Intravenous  Once 04/28/15 1048 04/28/15 1408   04/25/15 2100  vancomycin (VANCOCIN) IVPB 1000 mg/200 mL premix  Status:  Discontinued     1,000 mg 200 mL/hr over 60 Minutes Intravenous Every 24 hours 04/24/15 2032 04/26/15 1302   04/25/15 2030  cefTAZidime (FORTAZ) 2 g in dextrose 5 % 50 mL IVPB  Status:  Discontinued     2 g 100 mL/hr over 30 Minutes Intravenous Every 24 hours 04/24/15 2032 04/26/15 1302   04/25/15 0000  cefTAZidime (FORTAZ) 2 g in dextrose 5 % 50 mL IVPB  Status:  Discontinued     2 g 100 mL/hr over 30 Minutes Intravenous 3 times per day 04/24/15 2354 04/25/15 0018   04/24/15 2030  cefTAZidime (FORTAZ) 2 g in dextrose 5 % 50 mL IVPB  Status:  Discontinued     2 g 100 mL/hr over 30 Minutes Intravenous  Once 04/24/15 2025 04/24/15 2032   04/24/15 2030  vancomycin (VANCOCIN) IVPB 1000 mg/200 mL premix  Status:  Discontinued     1,000 mg 200 mL/hr over 60 Minutes Intravenous  Once 04/24/15 2025 04/24/15 2032      Objective: Filed Weights   05/09/15 0429 05/09/15 0843 05/10/15 0532  Weight: 81.4 kg (179 lb 7.3 oz) 75.5 kg (166 lb 7.2 oz) 73.846 kg (162 lb 12.8 oz)    Intake/Output Summary (Last 24 hours) at 05/10/15 1318 Last data filed at 05/10/15 1121  Gross per 24 hour  Intake    340 ml  Output      0 ml  Net    340 ml     Vitals Filed Vitals:   05/10/15 1148 05/10/15 1154 05/10/15 1209 05/10/15 1212  BP: 144/60  113/56 112/56  Pulse: 81  78 76   Temp:  98.1 F (36.7 C) 98.2 F (36.8 C) 97.5 F (36.4 C)  TempSrc:   Oral Oral  Resp: 18  18   Height:      Weight:      SpO2: 100%  100% 100%    Exam:  General:  Pt is alert, not in acute distress  HEENT: No icterus, No thrush, oral mucosa moist, poor dentition  Cardiovascular: regular rate and rhythm, S1/S2 No murmur  Respiratory: CTA b/l  Abdomen: Soft, +Bowel sounds, non tender, non distended, no guarding  MSK: No LE edema, cyanosis or clubbing  Skin fistula  not examined today  Data Reviewed: Basic Metabolic Panel:  Recent Labs Lab 05/05/15 1300 05/07/15 0835 05/07/15 1145 05/08/15 0541 05/09/15 0941 05/10/15 0422  NA 141 138 137  --  136 137  136  K 3.2* 3.6 3.6  --  3.7 3.8  3.8  CL 99* 101 98*  --  98* 97*  97*  CO2 32 29 30  --  29 28  28   GLUCOSE 110* 176* 258*  --  202* 96  95  BUN 38* 46* 49*  --  57* 26*  26*  CREATININE 2.65* 3.20* 3.20* 2.87* 3.60* 2.72*  2.69*  CALCIUM 8.1* 8.3* 8.2*  --  8.5* 8.4*  8.3*  PHOS 2.8 4.0 4.0  --  3.9 3.9   Liver Function Tests:  Recent Labs Lab 05/05/15 1300 05/07/15 0835 05/07/15 1145 05/09/15 0941 05/10/15 0422  ALBUMIN 2.5* 2.7* 2.6* 2.8* 3.0*   No results for input(s): LIPASE, AMYLASE in the last 168 hours. No results for input(s): AMMONIA in the last 168 hours. CBC:  Recent Labs Lab 05/04/15 0900 05/05/15 1300 05/07/15 1145 05/09/15 0940 05/10/15 0422  WBC 11.7* 10.1 12.2* 13.1* 15.0*  NEUTROABS 9.7*  --   --   --   --   HGB 10.3* 10.9* 11.0* 11.0* 11.5*  HCT 30.6* 35.0* 34.1* 35.9* 37.7  MCV 86.0 82.5 87.9 84.5 83.6  PLT 292 286 279 263 241   Cardiac Enzymes: No results for input(s): CKTOTAL, CKMB, CKMBINDEX, TROPONINI in the last 168 hours. BNP (last 3 results)  Recent Labs  06/17/14 1330 06/17/14 1946 04/24/15 1738  BNP 1951.5* 1985.7* 1594.7*    ProBNP (last 3 results)  Recent Labs  06/08/14 1617  PROBNP 1249.0*    CBG:  Recent Labs Lab 05/09/15 2112  05/10/15 0549 05/10/15 0932 05/10/15 1133 05/10/15 1205  GLUCAP 111* 89 92 102* 93    Recent Results (from the past 240 hour(s))  Urine culture     Status: None   Collection Time: 04/30/15  3:38 PM  Result Value Ref Range Status   Specimen Description URINE, CATHETERIZED  Final   Special Requests NONE  Final   Culture NO GROWTH 1 DAY  Final   Report Status 05/01/2015 FINAL  Final  Surgical pcr screen     Status: None   Collection Time: 05/09/15  5:15 PM  Result Value Ref Range Status   MRSA, PCR NEGATIVE NEGATIVE Final   Staphylococcus aureus NEGATIVE NEGATIVE Final    Comment:        The Xpert SA Assay (FDA approved for NASAL specimens in patients over 95 years of age), is one component of a comprehensive surveillance program.  Test performance has been validated by [facility] for patients greater than or equal to 8 year old. It is not intended to diagnose infection nor to guide or monitor treatment.      Studies: No results found.  Scheduled Meds:  Scheduled Meds: . allopurinol  100 mg Oral Daily  . aspirin EC  81 mg Oral Daily  . atorvastatin  40 mg Oral q1800  . calcitRIOL  0.25 mcg Oral Daily  . collagenase   Topical Daily  . enoxaparin (LOVENOX) injection  30 mg Subcutaneous QHS  . feeding supplement (GLUCERNA SHAKE)  237 mL Oral BID PC  . ferric gluconate (FERRLECIT/NULECIT) IV  125 mg Intravenous Q M,W,F-HD  . gabapentin  300 mg Oral QHS  . insulin aspart  0-15 Units Subcutaneous TID WC  . insulin  aspart  0-5 Units Subcutaneous QHS  . insulin aspart  5 Units Subcutaneous TID WC  . insulin detemir  16 Units Subcutaneous QHS  . isosorbide mononitrate  60 mg Oral Daily  . latanoprost  1 drop Both Eyes QHS  . mometasone-formoterol  2 puff Inhalation BID  . multivitamin with minerals  1 tablet Oral Daily  . pantoprazole  40 mg Oral BID  . ranolazine  500 mg Oral BID  . sertraline  100 mg Oral Daily  . sodium chloride  3 mL Intravenous Q12H    Continuous Infusions:   Time spent on care of this patient: 25 min  Verneita Griffes, MD [facility] [phone]

## 2015-05-10 NOTE — Progress Notes (Signed)
PT Cancellation Note  Patient Details Name: [name] MRN: [id number] DOB: 06-03-1930   Cancelled Treatment:    Reason Eval/Treat Not Completed: Fatigue/lethargy limiting ability to participate;Patient not medically ready.  Pt recently back from surgery to place an AVG.  RN deferred therapy as pt not able to participate presently. 05/10/2015  [name], [name] [phone]  (pager)   [name], [name] 05/10/2015, 4:21 PM

## 2015-05-10 NOTE — Progress Notes (Signed)
05/10/2015  10:53 AM Hemodialysis Outpatient Note; this patient has been accepted at the [facility] on a Tuesday,Thursday and Saturday 23nd shift schedule. Due to the upcoming Thanksgiving Holiday the center is able to accommodate the patient on Saturday November 26 at 10:30 AM. Thank you. Gordy Savers

## 2015-05-10 NOTE — Anesthesia Preprocedure Evaluation (Signed)
Anesthesia Evaluation  Patient identified by MRN, date of birth, ID band Patient awake    Reviewed: Allergy & Precautions, NPO status , Patient's Chart, lab work & pertinent test results  Airway Mallampati: II  TM Distance: >3 FB Neck ROM: Full    Dental  (+) Poor Dentition, Dental Advisory Given   Pulmonary asthma ,    breath sounds clear to auscultation       Cardiovascular hypertension, Pt. on medications + CAD, + Past MI and +CHF   Rhythm:Regular Rate:Normal     Neuro/Psych    GI/Hepatic GERD  ,  Endo/Other  diabetes, Well Controlled, Type 2  Renal/GU Renal disease     Musculoskeletal   Abdominal   Peds  Hematology   Anesthesia Other Findings   Reproductive/Obstetrics                             Anesthesia Physical Anesthesia Plan  ASA: IV  Anesthesia Plan: MAC   Post-op Pain Management:    Induction: Intravenous  Airway Management Planned: Simple Face Mask  Additional Equipment:   Intra-op Plan:   Post-operative Plan:   Informed Consent: I have reviewed the patients History and Physical, chart, labs and discussed the procedure including the risks, benefits and alternatives for the proposed anesthesia with the patient or authorized representative who has indicated his/her understanding and acceptance.   Dental advisory given  Plan Discussed with: CRNA, Surgeon and Anesthesiologist  Anesthesia Plan Comments:         Anesthesia Quick Evaluation

## 2015-05-10 NOTE — Transfer of Care (Signed)
Immediate Anesthesia Transfer of Care Note  Patient: [name]  Procedure(s) Performed: Procedure(s): CREATION OF LEFT UPPER ARM ARTERIOVENOUS (AV) FISTULA  (Left)  Patient Location: PACU  Anesthesia Type:MAC  Level of Consciousness: awake, oriented, patient cooperative and responds to stimulation  Airway & Oxygen Therapy: Patient Spontanous Breathing and Patient connected to nasal cannula oxygen  Post-op Assessment: Report given to RN, Post -op Vital signs reviewed and stable, Patient moving all extremities and Patient moving all extremities X 4  Post vital signs: Reviewed and stable  Last Vitals:  Filed Vitals:   05/09/15 2359 05/10/15 0532  BP:  134/52  Pulse: 74 68  Temp:  36.6 C  Resp: 19 18    Complications: No apparent anesthesia complications

## 2015-05-10 NOTE — Discharge Instructions (Signed)
° ° °  05/10/2015 [name] BP:[id number] 1929-12-13  Surgeon(s): Elam Dutch, MD  Procedure(s): CREATION OF LEFT UPPER ARM ARTERIOVENOUS (AV) FISTULA   x Do not stick fistula for 12 weeks

## 2015-05-10 NOTE — Progress Notes (Signed)
Cleansed the two pressure ulcer around the right buttocks adjacent to each other. One is smaller in size reddish in color minimal drainage.  The other one is bigger but it's in healing stage pinkish in color.Marland Kitchen Applied santyl ointment and Mepilex dressing.

## 2015-05-10 NOTE — Interval H&P Note (Signed)
History and Physical Interval Note:  05/10/2015 9:43 AM  [name]  has presented today for surgery, with the diagnosis of Stage III Chronic Kidney Disease N18.3  The various methods of treatment have been discussed with the patient and family. After consideration of risks, benefits and other options for treatment, the patient has consented to  Procedure(s): ARTERIOVENOUS (AV) FISTULA CREATION VERSUS GRAFT INSERTION (Left) as a surgical intervention .  The patient's history has been reviewed, patient examined, no change in status, stable for surgery.  I have reviewed the patient's chart and labs.  Questions were answered to the patient's satisfaction.     Ruta Hinds

## 2015-05-10 NOTE — Progress Notes (Signed)
OT Cancellation Note  Patient Details Name: [name] MRN: [id number] DOB: [birth year]   Cancelled Treatment:    Reason Eval/Treat Not Completed: Patient at procedure or test/ unavailable. Pt in sx.  Almon Register [id number] 05/10/2015, 10:01 AM

## 2015-05-10 NOTE — Telephone Encounter (Signed)
LM for pt re appt, dpm °

## 2015-05-10 NOTE — Consult Note (Addendum)
WOC wound consult note Reason for Consult: Consult requested for buttocks. Wound type: Stage 2 pressure injury to right buttock, 4X4X.1cm, pink and moist.  Another stage 2 located nearby, .5X.5X.1cm. Pressure Ulcer POA: Yes Drainage (amount, consistency, odor) Small amt yellow drainage, no odor Periwound: Intact skin surrounding. Several patchy areas of darker-colored skin in linear fashion, appearance  consistent with bruising. Dressing procedure/placement/frequency: Foam dressing to protect and promote healing.  Pt has been incontinent of large amt pasty stool, it will be difficult to keep wound from becoming soiled.  Please re-consult if further assistance is needed.  Thank-you,  Julien Girt MSN, [place], [place], [place], [place]

## 2015-05-10 NOTE — Anesthesia Procedure Notes (Signed)
Procedure Name: MAC Date/Time: 05/10/2015 10:00 AM Performed by: Jacquiline Doe A Pre-anesthesia Checklist: Patient identified, Emergency Drugs available, Suction available, Patient being monitored and Timeout performed Patient Re-evaluated:Patient Re-evaluated prior to inductionOxygen Delivery Method: Simple face mask Preoxygenation: Pre-oxygenation with 100% oxygen Intubation Type: Inhalational induction Placement Confirmation: positive ETCO2 Dental Injury: Teeth and Oropharynx as per pre-operative assessment

## 2015-05-10 NOTE — Progress Notes (Signed)
Subjective: Interval History: has complaints tired.  Objective: Vital signs in last 24 hours: Temp:  [97.9 F (36.6 C)-98.3 F (36.8 C)] 97.9 F (36.6 C) (11/23 0532) Pulse Rate:  [61-78] 68 (11/23 0532) Resp:  [18-20] 18 (11/23 0532) BP: (95-161)/(50-90) 134/52 mmHg (11/23 0532) SpO2:  [98 %-100 %] 100 % (11/23 0532) Weight:  [73.846 kg (162 lb 12.8 oz)-75.5 kg (166 lb 7.2 oz)] 73.846 kg (162 lb 12.8 oz) (11/23 0532) Weight change: -5.603 kg (-12 lb 5.6 oz)  Intake/Output from previous day: 11/22 0701 - 11/23 0700 In: 360 [P.O.:360] Out: 0  Intake/Output this shift:    General appearance: cooperative, moderately obese and pale Resp: diminished breath sounds bilaterally Chest wall: RIJ cath Cardio: S1, S2 normal and systolic murmur: holosystolic 2/6, blowing at apex GI: obese, pos bs, liver down 4 cm Extremities: edema Tr  Lab Results:  Recent Labs  05/09/15 0940 05/10/15 0422  WBC 13.1* 15.0*  HGB 11.0* 11.5*  HCT 35.9* 37.7  PLT 263 241   BMET:  Recent Labs  05/09/15 0941 05/10/15 0422  NA 136 137  136  K 3.7 3.8  3.8  CL 98* 97*  97*  CO2 29 28  28   GLUCOSE 202* 96  95  BUN 57* 26*  26*  CREATININE 3.60* 2.72*  2.69*  CALCIUM 8.5* 8.4*  8.3*    Recent Labs  05/09/15 0941  PTH 207*   Iron Studies:  Recent Labs  05/09/15 1553  IRON 52  TIBC 319    Studies/Results: No results found.  I have reviewed the patient's current medications.  Assessment/Plan: 1 CKD 5/ESRD HD yest. Will plan on Fri.  For access today 2 anemia stable 3 HPTH vit D 4 obesity  5 CAD 6 OSA 7 debill severe,  8 DM controlled 9 Decub  Sign of debill P HD, Fe, Vit D , perm access    LOS: 16 days   Trey Bebee L 05/10/2015,8:32 AM

## 2015-05-10 NOTE — Progress Notes (Signed)
RT placed patient on CPAP. Patient setting is 10 cmH20. Sterile water added to water chamber for humidification. Patient is tolerating well. RT will continue to monitor.

## 2015-05-10 NOTE — Op Note (Signed)
Procedure: Left Brachial Cephalic AV fistula  Preop: ESRD  Postop: ESRD  Anesthesia: Local with IV sedation  Assistant:  Leontine Locket PA-c  Findings: 3.5 mm cephalic vein  Procedure: After obtaining informed consent, the patient was taken to the operating room.  After induction of general anesthesia, the left upper extremity was prepped and draped in usual sterile fashion.  A transverse incision was then made near the antecubital crease the left arm. The incision was carried into the subcutaneous tissues down to level of the cephalic vein. The cephalic vein was approximately 3 mm in diameter. It was of good quality. This was dissected free circumferentially and small side branches ligated and divided between silk ties or clips. Next the brachial artery was dissected free in the medial portion of the incision. The artery was  3-4 mm in diameter. The vessel loops were placed proximal and distal to the planned site of arteriotomy. The patient was given 5000 units of intravenous heparin. After appropriate circulation time, the vessel loops were used to control the artery. A longitudinal opening was made in the brachial artery.  The vein was ligated distally with a 2-0 silk tie. The vein was controlled proximally with a fine bulldog clamp. The vein was then swung over to the artery and sewn end of vein to side of artery using a running 7-0 Prolene suture. Just prior to completion of the anastomosis, everything was fore bled back bled and thoroughly flushed. The anastomosis was secured, vessel loops released, and there was a palpable thrill in the fistula immediately. After hemostasis was obtained, the subcutaneous tissues were reapproximated using a running 3-0 Vicryl suture. The skin was then closed with a 4 0 Vicryl subcuticular stitch. Dermabond was applied to the skin incision.   Ruta Hinds, MD [facility] Office: [phone] Pager: [phone]

## 2015-05-11 LAB — GLUCOSE, CAPILLARY
GLUCOSE-CAPILLARY: 293 mg/dL — AB (ref 65–99)
Glucose-Capillary: 187 mg/dL — ABNORMAL HIGH (ref 65–99)
Glucose-Capillary: 117 mg/dL — ABNORMAL HIGH (ref 65–99)
Glucose-Capillary: 163 mg/dL — ABNORMAL HIGH (ref 65–99)

## 2015-05-11 MED ORDER — ISOSORBIDE MONONITRATE ER 60 MG PO TB24
60.0000 mg | ORAL_TABLET | Freq: Every day | ORAL | Status: DC
  Administered 2015-05-12 – 2015-05-14 (×3): 60 mg via ORAL
  Filled 2015-05-11 (×3): qty 1

## 2015-05-11 NOTE — Progress Notes (Signed)
RT placed pt on CPAP and patient is tolerating at this tiime.  RT will continue to monitor.

## 2015-05-11 NOTE — Progress Notes (Signed)
Subjective: Interval History: has no complaint of feels much better today.  Objective: Vital signs in last 24 hours: Temp:  [97.5 F (36.4 C)-98.4 F (36.9 C)] 98 F (36.7 C) (11/24 0537) Pulse Rate:  [39-81] 72 (11/24 0537) Resp:  [13-22] 22 (11/24 0537) BP: (112-144)/(56-64) 122/59 mmHg (11/24 0537) SpO2:  [86 %-100 %] 98 % (11/24 0537) Weight:  [73.796 kg (162 lb 11.1 oz)] 73.796 kg (162 lb 11.1 oz) (11/24 0537) Weight change: -1.704 kg (-3 lb 12.1 oz)  Intake/Output from previous day: 11/23 0701 - 11/24 0700 In: 820 [P.O.:720; I.V.:100] Out: 0  Intake/Output this shift:    General appearance: alert, cooperative and moderately obese Resp: diminished breath sounds bilaterally Chest wall: RIJ cath Cardio: S1, S2 normal and systolic murmur: holosystolic 2/6, blowing at apex GI: obese,pos bs, liver down5 cm Extremities: edema 1+ and AVF LUA  Lab Results:  Recent Labs  05/09/15 0940 05/10/15 0422  WBC 13.1* 15.0*  HGB 11.0* 11.5*  HCT 35.9* 37.7  PLT 263 241   BMET:  Recent Labs  05/09/15 0941 05/10/15 0422  NA 136 137  136  K 3.7 3.8  3.8  CL 98* 97*  97*  CO2 29 28  28   GLUCOSE 202* 96  95  BUN 57* 26*  26*  CREATININE 3.60* 2.72*  2.69*  CALCIUM 8.5* 8.4*  8.3*    Recent Labs  05/09/15 0941  PTH 207*   Iron Studies:  Recent Labs  05/09/15 1553  IRON 52  TIBC 319    Studies/Results: No results found.  I have reviewed the patient's current medications.  Assessment/Plan: 1 ESRD for HD in am .  To NH, Clip to [place] 2 HTN can stop amlod 3 Anemia getting Fe 4 ^ lipids ?? If should be on tx with no proven benefit 5 obesity 6 Decub skin care 7 OSA 8 CAD 9 OSA P HD in chair, outpatient arrangements.,stop amlod    LOS: 17 days   Cosima Prentiss L 05/11/2015,9:36 AM

## 2015-05-11 NOTE — Progress Notes (Signed)
[facility] Progress Note   [name]  [id number]  DOB: 29-Jan-1930  DOA: 04/24/2015 PCP: Renato Shin, MD  Brief narrative: 81 y/o ?  chronic kidney disease stage III,  coronary artery disease-NSTEAMI 06/2014-recent cath 02/2015 neg PVD s/p L BKA 0000000 chronic systolic and diastolic heart failure EF 45-50% by [facility] 02/2015,  obstructive sleep apnea,/COPD>?   type 2 diabetes mellitus on insulin  Chr Stg 2 pressure ulcer R buttocks  presents to the hospital for shortness of breath and cough and is found to have pulmonary edema.   She was recently treated with Zithromax and increased dose of Lasix by her PCP however continued to be short of breath at rest. She was started on diuretics after admission however creatinine rose steadily without much results and diuresis. Nephrology consult was requested and it was decided for her to undergo dialysis to help remove excess fluid.  Underwent creation L Brachio-ceph AV fistula 05/10/15   Subjective: Doing fair post-op Family in room Had a soft diet No chills nor fevers No n/v   Assessment/Plan:    Acute respiratory failure with hypoxia -  Acute on chronic diastolic heart failure- AKI - significant rise in creatinine with diuresis - dialysis started on 11/15  - left arm brachio-ceph fistula done 05/10/15 Wound on L arm looks clean and Vascular feels patient stable for d/c  Active Problems: HCAP? - has had a cough with congestion for 2 wks now - CXR obtained after fluid has been dialyzed off does not reveal infiltrates consistent with pneumonia - initially received Vancomycin and Ceftaz and then transitioned to Levaquin- cough unfortunately not improving with antibiotics- completed 14 days  - of note the patient never had leukocytosis or fevers - SLP eval- D 3 diet with thin liquids -added flutter valve on 11/24   Junctional rhythm/ NSVT - junctional rhythm resolved with holding B-Blocker -currently in NSR on  monitors - management per cardiology, they signed off on 11/22  DM (diabetes mellitus), type 2, uncontrolled, with renal complications   + neuropathy - uses 70/30 insulin at home at a dose of 14 U only at breakfast - has been started on Levemir 16 U and Novolog 5 U c Meals plus mealtime supplmental coverage in the hospital - she was hypoglycemic on 11/17- Levemir dosage decreased- sugars improved -sugars 135-293 -Continue Gabapentin 300 daily  Hyperlipidemia -cont atorvastatin 40 and Zetia 10 daily -2/2 prevention for lipid rich plaque and further PVD necessitates aggressive lipis control  Coronary artery disease due to lipid rich plaque; 70% ostial-proximal AV Groove Cx - not good PCI target -cont Imdur, Ranexa, statin and Aspirin  PVD s/p L BKA + Pressure ulcers -stump appears clean We will reassess wounds on bottom prior to d/c home  Anemia of chronic disease - stable -continue Nulecit 125 c mwf dialysis   Code Status:     Code Status Orders        Start     Ordered   04/24/15 2353  Full code   Continuous     04/24/15 2354     Family Communication:  Disposition Plan: SNF recommended-she needs dialysis and CLIP prior to d/c which will probably be 11.25.16 DVT prophylaxis: heparin Consultants:cardiology, nephrology Procedures:   Antibiotics: Anti-infectives    Start     Dose/Rate Route Frequency Ordered Stop   05/10/15 0600  cefUROXime (ZINACEF) 1.5 g in dextrose 5 % 50 mL IVPB     1.5 g 100 mL/hr over 30 Minutes Intravenous To  [facility] 05/08/15 0821 05/10/15 1015   05/02/15 1000  levofloxacin (LEVAQUIN) IVPB 500 mg  Status:  Discontinued     500 mg 100 mL/hr over 60 Minutes Intravenous Every 48 hours 04/30/15 0940 05/05/15 1024   04/30/15 1200  vancomycin (VANCOCIN) IVPB 1000 mg/200 mL premix  Status:  Discontinued     1,000 mg 200 mL/hr over 60 Minutes Intravenous Every 48 hours 04/28/15 1254 04/30/15 0918   04/30/15 1100  levofloxacin (LEVAQUIN)  IVPB 500 mg  Status:  Discontinued     500 mg 100 mL/hr over 60 Minutes Intravenous Every 48 hours 04/28/15 1052 04/29/15 0956   04/30/15 1000  levofloxacin (LEVAQUIN) IVPB 750 mg     750 mg 100 mL/hr over 90 Minutes Intravenous  Once 04/30/15 0944 04/30/15 1206   04/28/15 1100  levofloxacin (LEVAQUIN) IVPB 750 mg     750 mg 100 mL/hr over 90 Minutes Intravenous  Once 04/28/15 1034 04/28/15 1830   04/28/15 1100  cefTAZidime (FORTAZ) 2 g in dextrose 5 % 50 mL IVPB  Status:  Discontinued     2 g 100 mL/hr over 30 Minutes Intravenous Every 24 hours 04/28/15 1037 04/30/15 0940   04/28/15 1100  vancomycin (VANCOCIN) IVPB 1000 mg/200 mL premix     1,000 mg 200 mL/hr over 60 Minutes Intravenous  Once 04/28/15 1048 04/28/15 1408   04/25/15 2100  vancomycin (VANCOCIN) IVPB 1000 mg/200 mL premix  Status:  Discontinued     1,000 mg 200 mL/hr over 60 Minutes Intravenous Every 24 hours 04/24/15 2032 04/26/15 1302   04/25/15 2030  cefTAZidime (FORTAZ) 2 g in dextrose 5 % 50 mL IVPB  Status:  Discontinued     2 g 100 mL/hr over 30 Minutes Intravenous Every 24 hours 04/24/15 2032 04/26/15 1302   04/25/15 0000  cefTAZidime (FORTAZ) 2 g in dextrose 5 % 50 mL IVPB  Status:  Discontinued     2 g 100 mL/hr over 30 Minutes Intravenous 3 times per day 04/24/15 2354 04/25/15 0018   04/24/15 2030  cefTAZidime (FORTAZ) 2 g in dextrose 5 % 50 mL IVPB  Status:  Discontinued     2 g 100 mL/hr over 30 Minutes Intravenous  Once 04/24/15 2025 04/24/15 2032   04/24/15 2030  vancomycin (VANCOCIN) IVPB 1000 mg/200 mL premix  Status:  Discontinued     1,000 mg 200 mL/hr over 60 Minutes Intravenous  Once 04/24/15 2025 04/24/15 2032      Objective: Filed Weights   05/09/15 0843 05/10/15 0532 05/11/15 0537  Weight: 75.5 kg (166 lb 7.2 oz) 73.846 kg (162 lb 12.8 oz) 73.796 kg (162 lb 11.1 oz)    Intake/Output Summary (Last 24 hours) at 05/11/15 1433 Last data filed at 05/11/15 0600  Gross per 24 hour  Intake     480 ml  Output      0 ml  Net    480 ml     Vitals Filed Vitals:   05/10/15 1512 05/10/15 1529 05/10/15 2133 05/11/15 0537  BP:  125/58 128/64 122/59  Pulse:  77 75 72  Temp:  98.4 F (36.9 C) 98.3 F (36.8 C) 98 F (36.7 C)  TempSrc:  Oral Oral Oral  Resp:  18 20 22   Height:      Weight:    73.796 kg (162 lb 11.1 oz)  SpO2: 95% 86% 92% 98%    Exam:  General:  Pt is alert, not in acute distress  HEENT: No icterus, No thrush,  oral mucosa moist, poor dentition  Cardiovascular: regular rate and rhythm, S1/S2 No murmur  Respiratory: CTA b/l  Abdomen: Soft, +Bowel sounds, non tender, non distended, no guarding  MSK: No LE edema, cyanosis or clubbing  Skin fistula looks clean. Pressure ulcers not examined today   Data Reviewed: Basic Metabolic Panel:  Recent Labs Lab 05/05/15 1300 05/07/15 0835 05/07/15 1145 05/08/15 0541 05/09/15 0941 05/10/15 0422  NA 141 138 137  --  136 137  136  K 3.2* 3.6 3.6  --  3.7 3.8  3.8  CL 99* 101 98*  --  98* 97*  97*  CO2 32 29 30  --  29 28  28   GLUCOSE 110* 176* 258*  --  202* 96  95  BUN 38* 46* 49*  --  57* 26*  26*  CREATININE 2.65* 3.20* 3.20* 2.87* 3.60* 2.72*  2.69*  CALCIUM 8.1* 8.3* 8.2*  --  8.5* 8.4*  8.3*  PHOS 2.8 4.0 4.0  --  3.9 3.9   Liver Function Tests:  Recent Labs Lab 05/05/15 1300 05/07/15 0835 05/07/15 1145 05/09/15 0941 05/10/15 0422  ALBUMIN 2.5* 2.7* 2.6* 2.8* 3.0*   No results for input(s): LIPASE, AMYLASE in the last 168 hours. No results for input(s): AMMONIA in the last 168 hours. CBC:  Recent Labs Lab 05/05/15 1300 05/07/15 1145 05/09/15 0940 05/10/15 0422  WBC 10.1 12.2* 13.1* 15.0*  HGB 10.9* 11.0* 11.0* 11.5*  HCT 35.0* 34.1* 35.9* 37.7  MCV 82.5 87.9 84.5 83.6  PLT 286 279 263 241   Cardiac Enzymes: No results for input(s): CKTOTAL, CKMB, CKMBINDEX, TROPONINI in the last 168 hours. BNP (last 3 results)  Recent Labs  06/17/14 1330 06/17/14 1946  04/24/15 1738  BNP 1951.5* 1985.7* 1594.7*    ProBNP (last 3 results)  Recent Labs  06/08/14 1617  PROBNP 1249.0*    CBG:  Recent Labs Lab 05/10/15 1205 05/10/15 1644 05/10/15 2135 05/11/15 0624 05/11/15 1155  GLUCAP 93 216* 135* 187* 293*    Recent Results (from the past 240 hour(s))  Surgical pcr screen     Status: None   Collection Time: 05/09/15  5:15 PM  Result Value Ref Range Status   MRSA, PCR NEGATIVE NEGATIVE Final   Staphylococcus aureus NEGATIVE NEGATIVE Final    Comment:        The Xpert SA Assay (FDA approved for NASAL specimens in patients over 103 years of age), is one component of a comprehensive surveillance program.  Test performance has been validated by [facility] for patients greater than or equal to 15 year old. It is not intended to diagnose infection nor to guide or monitor treatment.      Studies: No results found.  Scheduled Meds:  Scheduled Meds: . allopurinol  100 mg Oral Daily  . aspirin EC  81 mg Oral Daily  . atorvastatin  40 mg Oral q1800  . calcitRIOL  0.25 mcg Oral Daily  . collagenase   Topical Daily  . enoxaparin (LOVENOX) injection  30 mg Subcutaneous QHS  . ezetimibe  10 mg Oral q morning - 10a  . feeding supplement (GLUCERNA SHAKE)  237 mL Oral BID PC  . ferric gluconate (FERRLECIT/NULECIT) IV  125 mg Intravenous Q M,W,F-HD  . gabapentin  300 mg Oral QHS  . insulin aspart  0-15 Units Subcutaneous TID WC  . insulin aspart  0-5 Units Subcutaneous QHS  . insulin aspart  5 Units Subcutaneous TID WC  . insulin detemir  16 Units Subcutaneous QHS  . [START ON 05/12/2015] isosorbide mononitrate  60 mg Oral QHS  . latanoprost  1 drop Both Eyes QHS  . mometasone-formoterol  2 puff Inhalation BID  . multivitamin with minerals  1 tablet Oral Daily  . pantoprazole  40 mg Oral BID  . ranolazine  500 mg Oral BID  . sertraline  100 mg Oral Daily  . sodium chloride  3 mL Intravenous Q12H   Continuous Infusions:   Time  spent on care of this patient: 25 min  Verneita Griffes, MD [facility] [phone]

## 2015-05-11 NOTE — Progress Notes (Signed)
Subjective  - POD #1  No complaints this am   Physical Exam:  Palpable left radial pulse Pulsatile feeling to left BCF Incisional ecchymosis      Assessment/Plan:  POD #1  Stable s/p Left BCF Plan f/u in 6 weeks with Dr. Manfred Arch for d/c  Annamarie Major 05/11/2015 9:00 AM --  Danley Danker Vitals:   05/10/15 2133 05/11/15 0537  BP: 128/64 122/59  Pulse: 75 72  Temp: 98.3 F (36.8 C) 98 F (36.7 C)  Resp: 20 22    Intake/Output Summary (Last 24 hours) at 05/11/15 0900 Last data filed at 05/11/15 0600  Gross per 24 hour  Intake    820 ml  Output      0 ml  Net    820 ml     Laboratory CBC    Component Value Date/Time   WBC 15.0* 05/10/2015 0422   HGB 11.5* 05/10/2015 0422   HCT 37.7 05/10/2015 0422   PLT 241 05/10/2015 0422    BMET    Component Value Date/Time   NA 136 05/10/2015 0422   NA 137 05/10/2015 0422   K 3.8 05/10/2015 0422   K 3.8 05/10/2015 0422   CL 97* 05/10/2015 0422   CL 97* 05/10/2015 0422   CO2 28 05/10/2015 0422   CO2 28 05/10/2015 0422   GLUCOSE 95 05/10/2015 0422   GLUCOSE 96 05/10/2015 0422   BUN 26* 05/10/2015 0422   BUN 26* 05/10/2015 0422   CREATININE 2.69* 05/10/2015 0422   CREATININE 2.72* 05/10/2015 0422   CALCIUM 8.3* 05/10/2015 0422   CALCIUM 8.4* 05/10/2015 0422   CALCIUM 9.8 02/20/2012 1018   GFRNONAA 15* 05/10/2015 0422   GFRNONAA 15* 05/10/2015 0422   GFRAA 18* 05/10/2015 0422   GFRAA 18* 05/10/2015 0422    COAG Lab Results  Component Value Date   INR 1.52* 05/02/2015   INR 1.23 03/07/2015   INR 1.18 06/18/2014   No results found for: PTT  Antibiotics Anti-infectives    Start     Dose/Rate Route Frequency Ordered Stop   05/10/15 0600  cefUROXime (ZINACEF) 1.5 g in dextrose 5 % 50 mL IVPB     1.5 g 100 mL/hr over 30 Minutes Intravenous To ShortStay [facility] 05/08/15 0821 05/10/15 1015   05/02/15 1000  levofloxacin (LEVAQUIN) IVPB 500 mg  Status:  Discontinued     500 mg 100 mL/hr over 60  Minutes Intravenous Every 48 hours 04/30/15 0940 05/05/15 1024   04/30/15 1200  vancomycin (VANCOCIN) IVPB 1000 mg/200 mL premix  Status:  Discontinued     1,000 mg 200 mL/hr over 60 Minutes Intravenous Every 48 hours 04/28/15 1254 04/30/15 0918   04/30/15 1100  levofloxacin (LEVAQUIN) IVPB 500 mg  Status:  Discontinued     500 mg 100 mL/hr over 60 Minutes Intravenous Every 48 hours 04/28/15 1052 04/29/15 0956   04/30/15 1000  levofloxacin (LEVAQUIN) IVPB 750 mg     750 mg 100 mL/hr over 90 Minutes Intravenous  Once 04/30/15 0944 04/30/15 1206   04/28/15 1100  levofloxacin (LEVAQUIN) IVPB 750 mg     750 mg 100 mL/hr over 90 Minutes Intravenous  Once 04/28/15 1034 04/28/15 1830   04/28/15 1100  cefTAZidime (FORTAZ) 2 g in dextrose 5 % 50 mL IVPB  Status:  Discontinued     2 g 100 mL/hr over 30 Minutes Intravenous Every 24 hours 04/28/15 1037 04/30/15 0940   04/28/15 1100  vancomycin (VANCOCIN) IVPB 1000 mg/200 mL premix  1,000 mg 200 mL/hr over 60 Minutes Intravenous  Once 04/28/15 1048 04/28/15 1408   04/25/15 2100  vancomycin (VANCOCIN) IVPB 1000 mg/200 mL premix  Status:  Discontinued     1,000 mg 200 mL/hr over 60 Minutes Intravenous Every 24 hours 04/24/15 2032 04/26/15 1302   04/25/15 2030  cefTAZidime (FORTAZ) 2 g in dextrose 5 % 50 mL IVPB  Status:  Discontinued     2 g 100 mL/hr over 30 Minutes Intravenous Every 24 hours 04/24/15 2032 04/26/15 1302   04/25/15 0000  cefTAZidime (FORTAZ) 2 g in dextrose 5 % 50 mL IVPB  Status:  Discontinued     2 g 100 mL/hr over 30 Minutes Intravenous 3 times per day 04/24/15 2354 04/25/15 0018   04/24/15 2030  cefTAZidime (FORTAZ) 2 g in dextrose 5 % 50 mL IVPB  Status:  Discontinued     2 g 100 mL/hr over 30 Minutes Intravenous  Once 04/24/15 2025 04/24/15 2032   04/24/15 2030  vancomycin (VANCOCIN) IVPB 1000 mg/200 mL premix  Status:  Discontinued     1,000 mg 200 mL/hr over 60 Minutes Intravenous  Once 04/24/15 2025 04/24/15 2032        V. Leia Alf, M.D. [facility] [place] Office: [phone] Pager:  [phone]

## 2015-05-12 LAB — GLUCOSE, CAPILLARY
GLUCOSE-CAPILLARY: 113 mg/dL — AB (ref 65–99)
GLUCOSE-CAPILLARY: 84 mg/dL (ref 65–99)
Glucose-Capillary: 243 mg/dL — ABNORMAL HIGH (ref 65–99)
Glucose-Capillary: 194 mg/dL — ABNORMAL HIGH (ref 65–99)

## 2015-05-12 LAB — CBC
HCT: 32 % — ABNORMAL LOW (ref 36.0–46.0)
HEMOGLOBIN: 9.9 g/dL — AB (ref 12.0–15.0)
MCH: 26.2 pg (ref 26.0–34.0)
MCHC: 30.9 g/dL (ref 30.0–36.0)
MCV: 84.7 fL (ref 78.0–100.0)
PLATELETS: 221 10*3/uL (ref 150–400)
RBC: 3.78 MIL/uL — AB (ref 3.87–5.11)
RDW: 19.7 % — AB (ref 11.5–15.5)
WBC: 10.7 10*3/uL — ABNORMAL HIGH (ref 4.0–10.5)

## 2015-05-12 LAB — RENAL FUNCTION PANEL
ANION GAP: 8 (ref 5–15)
Albumin: 2.8 g/dL — ABNORMAL LOW (ref 3.5–5.0)
BUN: 64 mg/dL — ABNORMAL HIGH (ref 6–20)
CALCIUM: 8.6 mg/dL — AB (ref 8.9–10.3)
CHLORIDE: 96 mmol/L — AB (ref 101–111)
CO2: 31 mmol/L (ref 22–32)
Creatinine, Ser: 3.88 mg/dL — ABNORMAL HIGH (ref 0.44–1.00)
GFR calc non Af Amer: 10 mL/min — ABNORMAL LOW (ref >60)
GFR, EST AFRICAN AMERICAN: 11 mL/min — AB (ref >60)
Glucose, Bld: 205 mg/dL — ABNORMAL HIGH (ref 65–99)
POTASSIUM: 4.2 mmol/L (ref 3.5–5.1)
Phosphorus: 5.4 mg/dL — ABNORMAL HIGH (ref 2.5–4.6)
SODIUM: 135 mmol/L (ref 135–145)

## 2015-05-12 MED ORDER — SORBITOL 70 % SOLN: 20.0000 mL | Freq: Every day | Status: DC | PRN

## 2015-05-12 MED ORDER — ENOXAPARIN SODIUM 30 MG/0.3ML ~~LOC~~ SOLN
30.0000 mg | SUBCUTANEOUS | Status: DC
  Administered 2015-05-14: 30 mg via SUBCUTANEOUS
  Filled 2015-05-12: qty 0.3

## 2015-05-12 MED ORDER — HYDROCODONE-ACETAMINOPHEN 10-325 MG PO TABS: 1.0000 | ORAL_TABLET | Freq: Four times a day (QID) | ORAL | Status: DC | PRN

## 2015-05-12 MED ORDER — GABAPENTIN 100 MG PO CAPS: 100.0000 mg | ORAL_CAPSULE | Freq: Every day | ORAL | Status: DC

## 2015-05-12 MED ORDER — PENTAFLUOROPROP-TETRAFLUOROETH EX AERO: 1.0000 "application " | INHALATION_SPRAY | CUTANEOUS | Status: DC | PRN

## 2015-05-12 MED ORDER — HEPARIN SODIUM (PORCINE) 1000 UNIT/ML DIALYSIS
1000.0000 [IU] | INTRAMUSCULAR | Status: DC | PRN
  Filled 2015-05-12: qty 1

## 2015-05-12 MED ORDER — HEPARIN SODIUM (PORCINE) 1000 UNIT/ML DIALYSIS
40.0000 [IU]/kg | Freq: Once | INTRAMUSCULAR | Status: DC
  Filled 2015-05-12: qty 3

## 2015-05-12 MED ORDER — SODIUM CHLORIDE 0.9 % IV SOLN: 100.0000 mL | INTRAVENOUS | Status: DC | PRN

## 2015-05-12 MED ORDER — BISACODYL 5 MG PO TBEC
5.0000 mg | DELAYED_RELEASE_TABLET | Freq: Every day | ORAL | Status: DC | PRN
  Administered 2015-05-12 – 2015-05-14 (×2): 5 mg via ORAL
  Filled 2015-05-12 (×2): qty 1

## 2015-05-12 MED ORDER — LIDOCAINE HCL (PF) 1 % IJ SOLN: 5.0000 mL | INTRAMUSCULAR | Status: DC | PRN

## 2015-05-12 MED ORDER — CEFAZOLIN SODIUM-DEXTROSE 2-3 GM-% IV SOLR
2.0000 g | INTRAVENOUS | Status: AC
  Administered 2015-05-15: 2 g via INTRAVENOUS
  Filled 2015-05-12: qty 50

## 2015-05-12 MED ORDER — LIDOCAINE-PRILOCAINE 2.5-2.5 % EX CREA
1.0000 | TOPICAL_CREAM | CUTANEOUS | Status: DC | PRN
Start: 2015-05-12 — End: 2015-05-12
  Filled 2015-05-12: qty 5

## 2015-05-12 MED ORDER — ALTEPLASE 2 MG IJ SOLR
2.0000 mg | Freq: Once | INTRAMUSCULAR | Status: DC | PRN
  Filled 2015-05-12: qty 2

## 2015-05-12 NOTE — Discharge Summary (Addendum)
Physician Discharge Summary  [name] FSF:[id number] DOB: 30-Apr-1930 DOA: 04/24/2015  PCP: Renato Shin, MD  Admit date: 04/24/2015 Discharge date: 05/12/2015  Time spent: 35 minutes  Recommendations for Outpatient Follow-up:  1. patient will need volume management by dialysis 2. discontinued the following medications this admissionLasix 40, gabapentin adjusted from 300-100 daily at bedtime, BiDil changed to hydralazine 25 3 times a day,potassium replacement 10 mEq daily 3. prescription given for gabapentin 100 daily at bedtime and hydrocodone 10/325 one every 6 when necessary pain 30 tablets 4. Resume NPH insulin 7030 14 units into skin daily per sliding scale and this may need to be adjusted as an outpatient 5. patient will be discharging to skilled nursing facility as an outpatient and will be dialyzing at [facility] please ensure that patient is on that schedule as she is a new start for dialysis  Discharge Diagnoses:  Principal Problem:   Acute respiratory failure with hypoxia ([place]) Active Problems:   DM (diabetes mellitus), type 2, uncontrolled, with renal complications ([place])   Hyperlipidemia   Essential hypertension   Coronary artery disease due to lipid rich plaque; 70% ostial-proximal AV Groove Cx - not good PCI target   Acute on chronic diastolic heart failure (HCC)   HCAP (healthcare-associated pneumonia)   NSVT (nonsustained ventricular tachycardia) (HCC)   Acute on chronic congestive heart failure (HCC)   AKI (acute kidney injury) ([place])   Acute kidney injury superimposed on chronic kidney disease ([place])   Discharge Condition: stable F2 renal heart healthy  Diet recommendation: renal fluid restricted low-salt diet-needs dysphagia 3 diet  Filed Weights   05/11/15 0537 05/12/15 0502 05/12/15 0758  Weight: 73.796 kg (162 lb 11.1 oz) 73 kg (160 lb 15 oz) 77.6 kg (171 lb 1.2 oz)    History of present illness:  81 y/o ? chronic kidney disease stage  III,  coronary artery disease-NSTEAMI 06/2014-recent cath 02/2015 neg PVD s/p L BKA 08/3433 chronic systolic and diastolic heart failure EF 45-50% by [facility] 02/2015,  obstructive sleep apnea,/COPD>?  type 2 diabetes mellitus on insulin  Chr Stg 2 pressure ulcer R buttocks  presents to the hospital for shortness of breath and cough and is found to have pulmonary edema.   She was recently treated with Zithromax and increased dose of Lasix by her PCP however continued to be short of breath at rest. She was started on diuretics after admission however creatinine rose steadily without much results and diuresis. Nephrology consult was requested and it was decided for her to undergo dialysis to help remove excess fluid.  Underwent creation L Brachio-ceph AV fistula 05/10/15  Hospital Course:  Acute respiratory failure with hypoxia - Acute on chronic diastolic heart failure- AKI - significant rise in creatinine with diuresis - dialysis started on 11/15  - left arm brachio-ceph fistula done 11/23/16and patient stabilized from that standpoint -Has right chest catheter for dialysis to which will need to be removed at discretion of nephrology probably in about 6 weeks Wound on L arm looks clean and Vascular feels patient stable for d/c  Active Problems: HCAP? - had a cough with congestion - CXR obtained after fluid has been dialyzed off does not reveal infiltrates consistent with pneumonia - initially received Vancomycin and Ceftaz and then transitioned to Levaquin- cough unfortunately not improving with antibiotics- completed 14 days  - of note the patient never had leukocytosis or fevers - SLP eval- D 3 diet with thin liquids -added flutter valve on 11/24   Junctional rhythm/  NSVT - junctional rhythm resolved with holding B-Blocker -currently in NSR on monitors - managementwas initially better per cardiology, they signed off on 11/22  DM (diabetes mellitus), type 2, uncontrolled, with  renal complications + neuropathy - uses 70/30 insulin at home at a dose of 14 U only at breakfast - has been started on Levemir 16 U and Novolog 5 U c Meals plus mealtime supplmental coverage in the hospital - she was hypoglycemic on 11/17- Levemir dosage decreased- sugars improved -sugars 135-293 -transitioned dose of Gabapentin 300 daily-->100 daily at bedtime  Hyperlipidemia -cont atorvastatin 40 and Zetia 10 daily -2/2 prevention for lipid rich plaque and further PVD necessitates aggressive lipis control  Coronary artery disease due to lipid rich plaque; 70% ostial-proximal AV Groove Cx - not good PCI target -cont Imdur, Ranexa, statin and Aspirin  PVD s/p L BKA + Pressure ulcers -stump appears clean -has a larger stage 1 ulcer ~ 5 cm circum and a smaller 2 cm circumferential stg2 ulcer  Anemia of chronic disease - stable -continue Nulecit 125 c mwf dialysis    Consultations:  cardiology  Discharge Exam: Filed Vitals:   05/12/15 0810 05/12/15 0828  BP: 137/57 128/61  Pulse: 67 68  Temp:    Resp:  15    General: EOMI NCAT Cardiovascular: S1-S2 no murmur rub or gallop Respiratory: clinically clear Skin on bottom  Discharge Instructions    Current Discharge Medication List    CONTINUE these medications which have CHANGED   Details  gabapentin (NEURONTIN) 100 MG capsule Take 1 capsule (100 mg total) by mouth at bedtime. Qty: 30 capsule, Refills: 0    HYDROcodone-acetaminophen (NORCO) 10-325 MG tablet Take 1 tablet by mouth every 6 (six) hours as needed for moderate pain. Qty: 30 tablet, Refills: 0      CONTINUE these medications which have NOT CHANGED   Details  acetaminophen (TYLENOL) 500 MG tablet Take 500 mg by mouth every 6 (six) hours as needed for mild pain.    albuterol (PROVENTIL HFA;VENTOLIN HFA) 108 (90 BASE) MCG/ACT inhaler Inhale 2 puffs into the lungs every 4 (four) hours as needed for wheezing or shortness of breath. Qty: 1 Inhaler,  Refills: 3    albuterol (PROVENTIL) (2.5 MG/3ML) 0.083% nebulizer solution Take 3 mLs (2.5 mg total) by nebulization every 4 (four) hours as needed for wheezing or shortness of breath. Qty: 75 mL, Refills: 12    aspirin EC 81 MG tablet Take 1 tablet (81 mg total) by mouth daily. Qty: 90 tablet, Refills: 3    calcitRIOL (ROCALTROL) 0.25 MCG capsule TAKE ONE CAPSULE BY MOUTH EVERY DAY Qty: 30 capsule, Refills: 0    clopidogrel (PLAVIX) 75 MG tablet Take 75 mg by mouth daily.    donepezil (ARICEPT) 5 MG tablet Take 1 tablet (5 mg total) by mouth at bedtime. Qty: 30 tablet, Refills: 11    feeding supplement (GLUCERNA SHAKE) LIQD Take 237 mLs by mouth every morning.     Fluticasone-Salmeterol (ADVAIR DISKUS) 100-50 MCG/DOSE AEPB Inhale 1 puff into the lungs 2 (two) times daily. Qty: 1 each, Refills: 11    insulin NPH-regular Human (NOVOLIN 70/30) (70-30) 100 UNIT/ML injection Inject 14 Units into the skin daily with breakfast. Sliding scale. Only takes in the morning Qty: 10 mL, Refills: 11    isosorbide mononitrate (IMDUR) 60 MG 24 hr tablet Take 60 mg by mouth daily. Refills: 5    latanoprost (XALATAN) 0.005 % ophthalmic solution Place 1 drop into both eyes at bedtime.  nitroGLYCERIN (NITROSTAT) 0.4 MG SL tablet Place 0.4 mg under the tongue every 5 (five) minutes as needed for chest pain.     ranolazine (RANEXA) 500 MG 12 hr tablet Take 1 tablet (500 mg total) by mouth 2 (two) times daily. Qty: 60 tablet, Refills: 3    ZETIA 10 MG tablet TAKE 1 TABLET BY MOUTH EVERY MORNING Qty: 30 tablet, Refills: 0    allopurinol (ZYLOPRIM) 100 MG tablet TAKE 1 TABLET BY MOUTH EVERY DAY Qty: 30 tablet, Refills: 0    amLODipine (NORVASC) 10 MG tablet TAKE 1 TABLET BY MOUTH DAILY Qty: 30 tablet, Refills: 0    atorvastatin (LIPITOR) 40 MG tablet TAKE 1 TABLET BY MOUTH DAILY AT 6 PM Qty: 30 tablet, Refills: 0    hydrALAZINE (APRESOLINE) 25 MG tablet TAKE 1 TABLET BY MOUTH THREE TIMES  DAILY Qty: 90 tablet, Refills: 0    ONE TOUCH ULTRA TEST test strip USE TO TEST BLOOD SUGAR TWICE DAILY. Qty: 100 each, Refills: 3    pantoprazole (PROTONIX) 40 MG tablet TAKE 1 TABLET BY MOUTH TWICE DAILY Qty: 60 tablet, Refills: 0    sertraline (ZOLOFT) 100 MG tablet TAKE 1 TABLET BY MOUTH EVERY DAY Qty: 30 tablet, Refills: 0      STOP taking these medications     furosemide (LASIX) 40 MG tablet      potassium chloride SA (K-DUR,KLOR-CON) 10 MEQ tablet      azithromycin (ZITHROMAX) 500 MG tablet      isosorbide-hydrALAZINE (BIDIL) 20-37.5 MG tablet        Allergies  Allergen Reactions  . Iohexol Itching and Rash     Code: RASH, Desc: [place] ON PT'S CHART ALLERGIC TO IV DYE 09/04/07/RM, Onset Date: [id number]    Follow-up Information    Follow up with Renato Shin, MD.   Specialty:  Endocrinology   Contact information:   [address] [employer] [address] [place] [address] [phone]       Follow up with [facility].   Why:  They will provide your home health care services at your home   Contact information:   [place] [address] [phone]       Follow up with Ruta Hinds, MD In 6 weeks.   Specialties:  Vascular Surgery, Cardiology   Why:  Office will call you to arrange your appt (sent)   Contact information:   [place]  [address] [phone]        The results of significant diagnostics from this hospitalization (including imaging, microbiology, ancillary and laboratory) are listed below for reference.    Significant Diagnostic Studies: Dg Chest 2 View  04/26/2015  CLINICAL DATA:  Shortness of breath. EXAM: CHEST  2 VIEW COMPARISON:  04/24/2015. FINDINGS: Cardiomegaly with pulmonary vascular prominence and mild interstitial prominence, improved from prior exam. Findings consistent with improving congestive heart failure. Cardiomegaly remains severe. Underlying process such as  pericardial effusion, cardiomyopathy, or cardiac valve disease cannot be excluded . No pleural effusion or pneumothorax . IMPRESSION: 1. Improving congestive heart failure. 2. Persistent severe cardiomegaly. Underlying process such as pericardial fusion, cardiomyopathy, cardiac valve disease cannot be excluded . Electronically Signed   By: Marcello Moores  Register   On: 04/26/2015 07:47   Dg Chest 2 View  04/24/2015  CLINICAL DATA:  Five day history of upper urinary tract infection. No improvement after antibiotics. Productive cough. EXAM: CHEST  2 VIEW COMPARISON:  04/19/2015 FINDINGS: The heart is enlarged but  appears relatively stable. There is tortuosity and calcification of the thoracic aorta. Diffuse bilateral airspace process could reflect pulmonary edema or bilateral infiltrates. No definite pleural effusions. The bony thorax is intact. IMPRESSION: Cardiac enlargement. Diffuse airspace process could reflect pulmonary edema or bilateral infiltrates. No definite pleural effusions. Electronically Signed   By: Marijo Sanes M.D.   On: 04/24/2015 17:16   Dg Chest 2 View  04/19/2015  CLINICAL DATA:  Cough for 1 week.  Wheezing for 2 days.  Congestion. EXAM: CHEST  2 VIEW COMPARISON:  03/06/2015 FINDINGS: There is cardiomegaly with vascular congestion and mild perihilar opacities concerning for early edema. No effusions. No acute bony abnormality. IMPRESSION: Findings concerning for mild CHF. Electronically Signed   By: Rolm Baptise M.D.   On: 04/19/2015 12:42   US Renal  05/01/2015  CLINICAL DATA:  Renal failure. EXAM: RENAL / URINARY TRACT ULTRASOUND COMPLETE COMPARISON:  CT, 12/05/2013 FINDINGS: Right Kidney: Length: 10.6 cm. Echogenicity within normal limits. No mass or hydronephrosis visualized. Left Kidney: Length: 10.3 cm. Echogenicity within normal limits. No mass or hydronephrosis visualized. Bladder: Decompressed with a Foley catheter IMPRESSION: Normal renal ultrasound. Electronically Signed   By:  Lajean Manes M.D.   On: 05/01/2015 12:46   Ir Fluoro Guide Cv Line Right  05/02/2015  INDICATION: Acute on chronic renal insufficiency. Please place temporary dialysis catheter for the initiation of dialysis. EXAM: NON-TUNNELED CENTRAL VENOUS HEMODIALYSIS CATHETER PLACEMENT WITH ULTRASOUND AND FLUOROSCOPIC GUIDANCE COMPARISON:  None. MEDICATIONS: None CONTRAST:  None FLUOROSCOPY TIME:  24 seconds (38.2 mGy) COMPLICATIONS: None immediate PROCEDURE: Informed written consent was obtained from the patient after a discussion of the risks, benefits, and alternatives to treatment. Questions regarding the procedure were encouraged and answered. The right neck and chest were prepped with chlorhexidine in a sterile fashion, and a sterile drape was applied covering the operative field. Maximum barrier sterile technique with sterile gowns and gloves were used for the procedure. A timeout was performed prior to the initiation of the procedure. After creating a small venotomy incision, a micropuncture kit was utilized to access the right internal jugular vein under direct, real-time ultrasound guidance after the overlying soft tissues were anesthetized with 1% lidocaine with epinephrine. Ultrasound image documentation was performed. The microwire was kinked to measure appropriate catheter length. A stiff glidewire was advanced to the level of the IVC. Under fluoroscopic guidance, the venotomy was serially dilated, ultimately allowing placement of a 20 cm temporary Trialysis catheter with tip ultimately terminating within the superior aspect of the right atrium. Final catheter positioning was confirmed and documented with a spot radiographic image. The catheter aspirates and flushes normally. The catheter was flushed with appropriate volume heparin dwells. The catheter exit site was secured with a 0-Prolene retention suture. A dressing was placed. The patient tolerated the procedure well without immediate post procedural  complication. IMPRESSION: Successful placement of a right internal jugular approach 20 cm temporary dialysis catheter with tip terminating with in the superior aspect of the right atrium. This catheter may be converted to a tunneled dialysis catheter at a later date as indicated. The catheter is ready for immediate use. Electronically Signed   By: Sandi Mariscal M.D.   On: 05/02/2015 09:53   Ir US Guide Vasc Access Right  05/02/2015  INDICATION: Acute on chronic renal insufficiency. Please place temporary dialysis catheter for the initiation of dialysis. EXAM: NON-TUNNELED CENTRAL VENOUS HEMODIALYSIS CATHETER PLACEMENT WITH ULTRASOUND AND FLUOROSCOPIC GUIDANCE COMPARISON:  None. MEDICATIONS: None CONTRAST:  None FLUOROSCOPY  TIME:  24 seconds (22.5 mGy) COMPLICATIONS: None immediate PROCEDURE: Informed written consent was obtained from the patient after a discussion of the risks, benefits, and alternatives to treatment. Questions regarding the procedure were encouraged and answered. The right neck and chest were prepped with chlorhexidine in a sterile fashion, and a sterile drape was applied covering the operative field. Maximum barrier sterile technique with sterile gowns and gloves were used for the procedure. A timeout was performed prior to the initiation of the procedure. After creating a small venotomy incision, a micropuncture kit was utilized to access the right internal jugular vein under direct, real-time ultrasound guidance after the overlying soft tissues were anesthetized with 1% lidocaine with epinephrine. Ultrasound image documentation was performed. The microwire was kinked to measure appropriate catheter length. A stiff glidewire was advanced to the level of the IVC. Under fluoroscopic guidance, the venotomy was serially dilated, ultimately allowing placement of a 20 cm temporary Trialysis catheter with tip ultimately terminating within the superior aspect of the right atrium. Final catheter  positioning was confirmed and documented with a spot radiographic image. The catheter aspirates and flushes normally. The catheter was flushed with appropriate volume heparin dwells. The catheter exit site was secured with a 0-Prolene retention suture. A dressing was placed. The patient tolerated the procedure well without immediate post procedural complication. IMPRESSION: Successful placement of a right internal jugular approach 20 cm temporary dialysis catheter with tip terminating with in the superior aspect of the right atrium. This catheter may be converted to a tunneled dialysis catheter at a later date as indicated. The catheter is ready for immediate use. Electronically Signed   By: Sandi Mariscal M.D.   On: 05/02/2015 09:53   Dg Chest Port 1 View  05/03/2015  CLINICAL DATA:  Shortness of Breath EXAM: PORTABLE CHEST 1 VIEW COMPARISON:  04/28/2015 FINDINGS: Cardiomediastinal silhouette is stable. Dual lumen right IJ catheter with tip in right atrium. Central mild vascular congestion and mild perihilar interstitial prominence suspicious for mild interstitial edema. No segmental infiltrate. IMPRESSION: Cardiomegaly again noted. Dual lumen right IJ catheter with tip in right atrium. Central mild vascular congestion and mild perihilar interstitial prominence suspicious for mild interstitial edema. Electronically Signed   By: Lahoma Crocker M.D.   On: 05/03/2015 11:36   Dg Chest Port 1 View  04/28/2015  CLINICAL DATA:  Cough, wheezing for 2 weeks. EXAM: PORTABLE CHEST 1 VIEW COMPARISON:  04/26/2015 FINDINGS: Cardiomegaly. Decreasing lung volumes with increasing perihilar and lower lobe airspace opacities concerning for CHF. No effusions. No acute bony abnormality. IMPRESSION: Cardiomegaly with increasing bilateral airspace disease, likely edema/ CHF. Low lung volumes. Electronically Signed   By: Rolm Baptise M.D.   On: 04/28/2015 11:11   Dg Swallowing Func-speech Pathology  05/03/2015  Objective Swallowing  Evaluation:   MBS Patient Details Name: [name] MRN: [id number] Date of Birth: 1929/08/21 Today's Date: 05/03/2015 Time: SLP Start Time (ACUTE ONLY): 1340-SLP Stop Time (ACUTE ONLY): 1400 SLP Time Calculation (min) (ACUTE ONLY): 20 min Past Medical History: Past Medical History Diagnosis Date . Uterine cancer ([place])  . Diabetes mellitus    type II; peripheral neuropathy . Hypertension  . GERD (gastroesophageal reflux disease)  . Peripheral vascular disease ([place])    a. s/p L BKA 08/2012. . Obesity  . Dyslipidemia  . Chronic combined systolic and diastolic CHF (congestive heart failure) (HCC)    a. EF 50-55%, mild LVH and grade 1 diast. Dysfxn b. Grade 3 Diastolic Dysfunction 82/5189. b. 02/2015: EF  45-50% by cath. . Osteoarthritis cervical spine  . DM retinopathy ([place])  . Sleep apnea    with CPAP . History of shingles  . COPD (chronic obstructive pulmonary disease) ([place])  . Depression  . Peptic ulcer disease    duodenal . Peptic stricture of esophagus  . Hiatal hernia  . Diverticulosis  . Arthritis  . CAD (coronary artery disease)    a. s/p multiple caths with nonobs CAD;   b. cath 1/10: pLAD 20%, mLAD 40%, pCFX 20%, mCFX 40%, pRCA 60-70%;   c.  Myoview 06/02/12: Low anterior wall scar, no ischemia, EF 37%. d. cath 06/20/2014 70% mid LCx, medical therapy, high risk for PCI due to close proximity to large OM e. cath 03/07/15 pro RCA 50% & pro to mid Cx 75%, both stable, LVEDP 33-48mm Hg-> medical management . Cardiomyopathy ([place])    a. Echo 05/31/12: EF 40-45%. b. Echo 06/2014: EF 50-55%. c. Cath 02/2015: EF 45-50%. . Asthma    Mild . Pruritic condition    Idiopathic . Zoster  . Noncompliance  . CKD (chronic kidney disease), stage III    Dr. Lorrene Reid . Bradycardia    a. Not on BB due to this. . Shortness of breath dyspnea  Past Surgical History: Past Surgical History Procedure Laterality Date . Tubal ligation  1967 . Knee arthroscopy  10/1998   Left . Craniotomy  1997   Left for SDH . Cataract extraction, bilateral   2005 . Hernia repair   . Esophagogastroduodenoscopy  04/04/2004 . Spine surgery     C-spine and lumbar surgery . Cholecystectomy  2010 . Cardiac catheterization   . Dexa  7/05 . Amputation Left 09/04/2012   Procedure: AMPUTATION BELOW KNEE;  Surgeon: Angelia Mould, MD;  Location: [place];  Service: Vascular;  Laterality: Left; . Abdominal angiogram N/A 08/31/2012   Procedure: ABDOMINAL ANGIOGRAM with runoff poss intervention;  Surgeon: Angelia Mould, MD;  Location[facility] CATH LAB;  Service: Cardiovascular;  Laterality: N/A; . Tubal ligation   . Left heart catheterization with coronary angiogram N/A 06/22/2014   Procedure: LEFT HEART CATHETERIZATION WITH CORONARY ANGIOGRAM;  Surgeon: Burnell Blanks, MD;  Location: [facility] CATH LAB;  Service: Cardiovascular;  Laterality: N/A; . Eye surgery   . Brain surgery     1997 blood clot on the brain, then had to relieve fluid on the brain . Multiple extractions with alveoloplasty N/A 08/01/2014   Procedure: EXTRACTIONS OF TEETH NUMBERS $RemoveBefor'[id number] [phone] [id number] 11 '[id number]$ AND 19 AND ALVEOLOPLASTY UPPER LEFT AND RIGHT QUADRANT;  Surgeon: Isac Caddy, DDS;  Location: [place];  Service: Oral Surgery;  Laterality: N/A; . Cardiac catheterization N/A 03/07/2015   Procedure: Left Heart Cath and Coronary Angiography;  Surgeon: Leonie Man, MD;  Location: [place] CV LAB;  Service: Cardiovascular;  Laterality: N/A; HPI: [name] is an 81 y.o. female patient with history of chronic combined CHF (EF 45-50 percent & grade 3 diastolic dysfunction by [facility] 02/2015), CAD, DM 2, HTN, HLD, OSA on nightly CPAP, PAD status post left BKA, stage III CKD, COPD/? Asthma, bradycardia (hence not on beta blockers), recent hospitalization 9/19-9/22 for unstable angina at which time cath showed stable anatomy, now presented to [facility] ED on 04/24/15 with worsening dyspnea, orthopnea, productive cough, pleuritic appearing chest pain but no fevers. Recently seen by PCP and started on azithromycin for  suspected pneumonia & Lasix dose increased. Noncompliant with fluid and water restriction. Admitted for decompensated CHF & question of pneumonia. Per  MD notes, on 11/11 patient began to c/o coughing while eating. MBS recommended after swallow session this am.  No Data Recorded Assessment / Plan / Recommendation CHL IP CLINICAL IMPRESSIONS 05/03/2015 Therapy Diagnosis Mild pharyngeal phase dysphagia Clinical Impression Mild pharyngeal dysphagia as evidenced by delayed swallow initiation to pyriform sinuses due to decreased pharyngeal sensation. Mild pyriform sinus residue due to decreased laryngeal elevation cleared with verbal cues for second swallow. Pt coughed during MBS without barium observed in laryngeal vestibule during study. Recommend pt continue regular texture diet, thin liquids, straws allowed, small sips and bites, pills with thin (one at a time). Follow up x 1 for education.     CHL IP TREATMENT RECOMMENDATION 05/03/2015 Treatment Recommendations Therapy as outlined in treatment plan below   CHL IP DIET RECOMMENDATION 05/03/2015 SLP Diet Recommendations Regular solids;Thin liquid Liquid Administration via Cup;Straw Medication Administration Whole meds with liquid Compensations Slow rate;Small sips/bites;Multiple dry swallows after each bite/sip Postural Changes Seated upright at 90 degrees   CHL IP OTHER RECOMMENDATIONS 05/03/2015 Recommended Consults -- Oral Care Recommendations Oral care BID Other Recommendations --   CHL IP FOLLOW UP RECOMMENDATIONS 05/03/2015 Follow up Recommendations None   CHL IP FREQUENCY AND DURATION 05/03/2015 Speech Therapy Frequency (ACUTE ONLY) min 1 x/week Treatment Duration 1 week      CHL IP ORAL PHASE 05/03/2015 Oral Phase WFL Oral - Pudding Teaspoon -- Oral - Pudding Cup -- Oral - Honey Teaspoon -- Oral - Honey Cup -- Oral - Nectar Teaspoon -- Oral - Nectar Cup -- Oral - Nectar Straw -- Oral - Thin Teaspoon -- Oral - Thin Cup -- Oral - Thin Straw -- Oral - Puree --  Oral - Mech Soft -- Oral - Regular -- Oral - Multi-Consistency -- Oral - Pill -- Oral Phase - Comment --  CHL IP PHARYNGEAL PHASE 05/03/2015 Pharyngeal Phase Thin;Solids Pharyngeal- Pudding Teaspoon -- Pharyngeal -- Pharyngeal- Pudding Cup -- Pharyngeal -- Pharyngeal- Honey Teaspoon -- Pharyngeal -- Pharyngeal- Honey Cup -- Pharyngeal -- Pharyngeal- Nectar Teaspoon -- Pharyngeal -- Pharyngeal- Nectar Cup -- Pharyngeal -- Pharyngeal- Nectar Straw -- Pharyngeal -- Pharyngeal- Thin Teaspoon NT Pharyngeal -- Pharyngeal- Thin Cup Delayed swallow initiation-pyriform sinuses;Pharyngeal residue - pyriform;Reduced laryngeal elevation Pharyngeal -- Pharyngeal- Thin Straw Delayed swallow initiation-pyriform sinuses;Pharyngeal residue - pyriform;Reduced laryngeal elevation Pharyngeal -- Pharyngeal- Puree Delayed swallow initiation-vallecula;Pharyngeal residue - pyriform;Reduced laryngeal elevation Pharyngeal -- Pharyngeal- Mechanical Soft NT Pharyngeal -- Pharyngeal- Regular Delayed swallow initiation-vallecula Pharyngeal -- Pharyngeal- Multi-consistency NT Pharyngeal -- Pharyngeal- Pill Delayed swallow initiation-vallecula Pharyngeal -- Pharyngeal Comment --  CHL IP CERVICAL ESOPHAGEAL PHASE 05/03/2015 Cervical Esophageal Phase WFL Pudding Teaspoon -- Pudding Cup -- Honey Teaspoon -- Honey Cup -- Nectar Teaspoon -- Nectar Cup -- Nectar Straw -- Thin Teaspoon -- Thin Cup -- Thin Straw -- Puree -- Mechanical Soft -- Regular -- Multi-consistency -- Pill -- Cervical Esophageal Comment -- [place] Siren 05/03/2015, 4:07 PM  Orbie Pyo Colvin Caroli.[employer] [phone]              Microbiology: Recent Results (from the past 240 hour(s))  Surgical pcr screen     Status: None   Collection Time: 05/09/15  5:15 PM  Result Value Ref Range Status   MRSA, PCR NEGATIVE NEGATIVE Final   Staphylococcus aureus NEGATIVE NEGATIVE Final    Comment:        The Xpert SA Assay (FDA approved for NASAL specimens in patients over  54 years of age), is one component of a comprehensive surveillance program.  Test performance has been validated by [facility] for patients greater than or equal to 74 year old. It is not intended to diagnose infection nor to guide or monitor treatment.      Labs: Basic Metabolic Panel:  Recent Labs Lab 05/05/15 1300 05/07/15 0835 05/07/15 1145 05/08/15 0541 05/09/15 0941 05/10/15 0422  NA 141 138 137  --  136 137  136  K 3.2* 3.6 3.6  --  3.7 3.8  3.8  CL 99* 101 98*  --  98* 97*  97*  CO2 32 29 30  --  $R'29 28  28  'jW$ GLUCOSE 110* 176* 258*  --  202* 96  95  BUN 38* 46* 49*  --  57* 26*  26*  CREATININE 2.65* 3.20* 3.20* 2.87* 3.60* 2.72*  2.69*  CALCIUM 8.1* 8.3* 8.2*  --  8.5* 8.4*  8.3*  PHOS 2.8 4.0 4.0  --  3.9 3.9   Liver Function Tests:  Recent Labs Lab 05/05/15 1300 05/07/15 0835 05/07/15 1145 05/09/15 0941 05/10/15 0422  ALBUMIN 2.5* 2.7* 2.6* 2.8* 3.0*   No results for input(s): LIPASE, AMYLASE in the last 168 hours. No results for input(s): AMMONIA in the last 168 hours. CBC:  Recent Labs Lab 05/05/15 1300 05/07/15 1145 05/09/15 0940 05/10/15 0422 05/12/15 0831  WBC 10.1 12.2* 13.1* 15.0* 10.7*  HGB 10.9* 11.0* 11.0* 11.5* 9.9*  HCT 35.0* 34.1* 35.9* 37.7 32.0*  MCV 82.5 87.9 84.5 83.6 84.7  PLT 286 279 263 241 221   Cardiac Enzymes: No results for input(s): CKTOTAL, CKMB, CKMBINDEX, TROPONINI in the last 168 hours. BNP: BNP (last 3 results)  Recent Labs  06/17/14 1330 06/17/14 1946 04/24/15 1738  BNP 1951.5* 1985.7* 1594.7*    ProBNP (last 3 results)  Recent Labs  06/08/14 1617  PROBNP 1249.0*    CBG:  Recent Labs Lab 05/11/15 0624 05/11/15 1155 05/11/15 1647 05/11/15 2036 05/12/15 0545  GLUCAP 187* 293* 117* 163* 243*       Signed:  Nita Sells  [facility] 05/12/2015, 9:09 AM

## 2015-05-12 NOTE — Clinical Social Work Note (Signed)
Clinical Social Worker continuing to follow patient and family for support and discharge planning needs.  Patient had dialysis this morning with plans to discharge to [facility] at 18:00 tonight.  Per RN, MD states preference to CIR and/or LTAC - CM notified.  CSW to update facility and will remain involved for support and potential discharge planning needs.  Barbette Or, [place]

## 2015-05-12 NOTE — Progress Notes (Signed)
[name] was to be discharged to SNF today however she is not strong enough to sit in a chair for more than 1-2 hours and still has an untunneled HD catheter with pigtail in place.  She is not ready for outpt hemodialysis at this time and discussed with Dr. Verlon Au who will ask SW to investigate CIR or LTC placement.  Will also order tunneled HD catheter by IR.

## 2015-05-12 NOTE — Progress Notes (Signed)
Subjective: Interval History: has no complaint .  Objective: Vital signs in last 24 hours: Temp:  [97.3 F (36.3 C)-98.6 F (37 C)] 98.6 F (37 C) (11/25 0758) Pulse Rate:  [67-79] 68 (11/25 0828) Resp:  [15-71] 15 (11/25 0828) BP: (121-137)/(47-61) 128/61 mmHg (11/25 0828) SpO2:  [94 %-98 %] 98 % (11/25 0502) Weight:  [73 kg (160 lb 15 oz)-77.6 kg (171 lb 1.2 oz)] 77.6 kg (171 lb 1.2 oz) (11/25 0758) Weight change: -0.796 kg (-1 lb 12.1 oz)  Intake/Output from previous day: 11/24 0701 - 11/25 0700 In: 840 [P.O.:840] Out: -  Intake/Output this shift:    General appearance: alert, cooperative and moderately obese Resp: diminished breath sounds bilaterally and rhonchi bibasilar Chest wall: RIJ cath Cardio: S1, S2 normal and systolic murmur: holosystolic 2/6, blowing at apex GI: pos bs, obese, soft, liver down 4 cm Extremities: AVF LUA   Lab Results:  Recent Labs  05/10/15 0422 05/12/15 0831  WBC 15.0* 10.7*  HGB 11.5* 9.9*  HCT 37.7 32.0*  PLT 241 221   BMET:  Recent Labs  05/09/15 0941 05/10/15 0422  NA 136 137  136  K 3.7 3.8  3.8  CL 98* 97*  97*  CO2 29 28  28   GLUCOSE 202* 96  95  BUN 57* 26*  26*  CREATININE 3.60* 2.72*  2.69*  CALCIUM 8.5* 8.4*  8.3*    Recent Labs  05/09/15 0941  PTH 207*   Iron Studies:  Recent Labs  05/09/15 1553  IRON 52  TIBC 319    Studies/Results: No results found.  I have reviewed the patient's current medications.  Assessment/Plan: 1 ESRD to go to [facility].  Vol ok. New acces stable. 2 CAD 3 Anemia stable 4 HPTH stable 5 ^ lipid 6 OSA 7 decub 8 Obesity P HD, d/c to NH, to [facility]    LOS: 18 days   Magen Suriano L 05/12/2015,8:54 AM

## 2015-05-12 NOTE — Progress Notes (Signed)
PT Cancellation Note: Pt at HD, will check back as time allows.   [name], [place]  [phone]

## 2015-05-12 NOTE — Progress Notes (Signed)
Pt arrived per bed to unit at 0758.  Report received from bedside RN.  Lungs diminished. Regular R&R Generalized edema. A & O X 4  R tunneled IJ accessed, both ports aspirated and flushed with normal saline.  Tx initiated at 0810 with goal of 323mL  and net removal of 2L.  Will continue to monitor.

## 2015-05-12 NOTE — Procedures (Signed)
I was present at this session.  I have reviewed the session itself and made appropriate changes.  HD via PC . Not in chair as ordered.  bp ok.    [name] L 11/25/20168:54 AM

## 2015-05-12 NOTE — H&P (Signed)
Chief Complaint: ESRD now on hemodialysis  Referring Physician(s): Donato Heinz  History of Present Illness: [name] is a 81 y.o. female who developed acute on chronic renal failure and now needs routine hemodialysis.  She was initially admitted on 04/24/2015 with increasing SOB secondary to CHF versus pneumonia  She had a temporary trialysis catheter placed by Dr. Pascal Lux on 05/02/2015.  It is working well.   We are asked to replace the temp cath with a tunneled catheter until her newly placed fistula can mature.   Past Medical History  Diagnosis Date  . Uterine cancer ([place])   . Diabetes mellitus     type II; peripheral neuropathy  . Hypertension   . GERD (gastroesophageal reflux disease)   . Peripheral vascular disease ([place])     a. s/p L BKA 08/2012.  . Obesity   . Dyslipidemia   . Chronic combined systolic and diastolic CHF (congestive heart failure) (HCC)     a. EF 50-55%, mild LVH and grade 1 diast. Dysfxn b. Grade 3 Diastolic Dysfunction 30/0762. b. 02/2015: EF 45-50% by cath.  . Osteoarthritis cervical spine   . DM retinopathy ([place])   . Sleep apnea     with CPAP  . History of shingles   . COPD (chronic obstructive pulmonary disease) ([place])   . Depression   . Peptic ulcer disease     duodenal  . Peptic stricture of esophagus   . Hiatal hernia   . Diverticulosis   . Arthritis   . CAD (coronary artery disease)     a. s/p multiple caths with nonobs CAD;   b. cath 1/10: pLAD 20%, mLAD 40%, pCFX 20%, mCFX 40%, pRCA 60-70%;   c.  Myoview 06/02/12: Low anterior wall scar, no ischemia, EF 37%. d. cath 06/20/2014 70% mid LCx, medical therapy, high risk for PCI due to close proximity to large OM e. cath 03/07/15 pro RCA 50% & pro to mid Cx 75%, both stable, LVEDP 33-52mm Hg-> medical management  . Cardiomyopathy ([place])     a. Echo 05/31/12: EF 40-45%. b. Echo 06/2014: EF 50-55%. c. Cath 02/2015: EF 45-50%.  . Asthma     Mild  . Pruritic condition    Idiopathic  . Zoster   . Noncompliance   . CKD (chronic kidney disease), stage III     Dr. Lorrene Reid  . Bradycardia     a. Not on BB due to this.  . Shortness of breath dyspnea     Past Surgical History  Procedure Laterality Date  . Tubal ligation  1967  . Knee arthroscopy  10/1998    Left  . Craniotomy  1997    Left for SDH  . Cataract extraction, bilateral  2005  . Hernia repair    . Esophagogastroduodenoscopy  04/04/2004  . Spine surgery      C-spine and lumbar surgery  . Cholecystectomy  2010  . Cardiac catheterization    . Dexa  7/05  . Amputation Left 09/04/2012    Procedure: AMPUTATION BELOW KNEE;  Surgeon: Angelia Mould, MD;  Location: [place];  Service: Vascular;  Laterality: Left;  . Abdominal angiogram N/A 08/31/2012    Procedure: ABDOMINAL ANGIOGRAM with runoff poss intervention;  Surgeon: Angelia Mould, MD;  Location[facility] CATH LAB;  Service: Cardiovascular;  Laterality: N/A;  . Tubal ligation    . Left heart catheterization with coronary angiogram N/A 06/22/2014    Procedure: LEFT HEART CATHETERIZATION WITH CORONARY ANGIOGRAM;  Surgeon:  Burnell Blanks, MD;  Location: [facility] CATH LAB;  Service: Cardiovascular;  Laterality: N/A;  . Eye surgery    . Brain surgery      1997 blood clot on the brain, then had to relieve fluid on the brain  . Multiple extractions with alveoloplasty N/A 08/01/2014    Procedure: EXTRACTIONS OF TEETH NUMBERS $RemoveBefor'7 8 9 10 11 'BiMmOGECXCWC$ AND 19 AND ALVEOLOPLASTY UPPER LEFT AND RIGHT QUADRANT;  Surgeon: Isac Caddy, DDS;  Location: [place];  Service: Oral Surgery;  Laterality: N/A;  . Cardiac catheterization N/A 03/07/2015    Procedure: Left Heart Cath and Coronary Angiography;  Surgeon: Leonie Man, MD;  Location: [place] CV LAB;  Service: Cardiovascular;  Laterality: N/A;    Allergies: Iohexol  Medications: Prior to Admission medications   Medication Sig Start Date End Date Taking? Authorizing Provider  acetaminophen (TYLENOL)  500 MG tablet Take 500 mg by mouth every 6 (six) hours as needed for mild pain.   Yes Historical Provider, MD  albuterol (PROVENTIL HFA;VENTOLIN HFA) 108 (90 BASE) MCG/ACT inhaler Inhale 2 puffs into the lungs every 4 (four) hours as needed for wheezing or shortness of breath. 04/19/15  Yes Renato Shin, MD  albuterol (PROVENTIL) (2.5 MG/3ML) 0.083% nebulizer solution Take 3 mLs (2.5 mg total) by nebulization every 4 (four) hours as needed for wheezing or shortness of breath. 12/08/13  Yes Robbie Lis, MD  allopurinol (ZYLOPRIM) 100 MG tablet TAKE 1 TABLET BY MOUTH DAILY 07/25/14  Yes Renato Shin, MD  aspirin EC 81 MG tablet Take 1 tablet (81 mg total) by mouth daily. 03/16/15  Yes Dayna N Dunn, PA-C  calcitRIOL (ROCALTROL) 0.25 MCG capsule TAKE ONE CAPSULE BY MOUTH EVERY DAY 04/29/14  Yes Renato Shin, MD  clopidogrel (PLAVIX) 75 MG tablet Take 75 mg by mouth daily.   Yes Historical Provider, MD  donepezil (ARICEPT) 5 MG tablet Take 1 tablet (5 mg total) by mouth at bedtime. 05/19/14  Yes Renato Shin, MD  feeding supplement (GLUCERNA SHAKE) LIQD Take 237 mLs by mouth every morning.    Yes Historical Provider, MD  Fluticasone-Salmeterol (ADVAIR DISKUS) 100-50 MCG/DOSE AEPB Inhale 1 puff into the lungs 2 (two) times daily. 04/19/15  Yes Renato Shin, MD  furosemide (LASIX) 40 MG tablet Take 1 tablet (40 mg total) by mouth daily. Patient taking differently: Take 40 mg by mouth 2 (two) times daily.  04/19/15  Yes Renato Shin, MD  insulin NPH-regular Human (NOVOLIN 70/30) (70-30) 100 UNIT/ML injection Inject 14 Units into the skin daily with breakfast. Sliding scale. Only takes in the morning 04/19/15  Yes Renato Shin, MD  isosorbide mononitrate (IMDUR) 60 MG 24 hr tablet Take 60 mg by mouth daily. 04/05/15  Yes Historical Provider, MD  latanoprost (XALATAN) 0.005 % ophthalmic solution Place 1 drop into both eyes at bedtime.   Yes Historical Provider, MD  nitroGLYCERIN (NITROSTAT) 0.4 MG SL tablet Place 0.4  mg under the tongue every 5 (five) minutes as needed for chest pain.    Yes Historical Provider, MD  potassium chloride SA (K-DUR,KLOR-CON) 10 MEQ tablet Take 1 tablet (10 mEq total) by mouth daily. 03/09/15  Yes Bhavinkumar Bhagat, PA  ranolazine (RANEXA) 500 MG 12 hr tablet Take 1 tablet (500 mg total) by mouth 2 (two) times daily. 03/09/15  Yes Bhavinkumar Bhagat, PA  ZETIA 10 MG tablet TAKE 1 TABLET BY MOUTH EVERY MORNING 04/17/15  Yes Renato Shin, MD  allopurinol (ZYLOPRIM) 100 MG tablet TAKE 1 TABLET BY  MOUTH EVERY DAY 04/26/15   Renato Shin, MD  amLODipine (NORVASC) 10 MG tablet TAKE 1 TABLET BY MOUTH DAILY 04/28/15   Renato Shin, MD  atorvastatin (LIPITOR) 40 MG tablet TAKE 1 TABLET BY MOUTH DAILY AT 6 PM 04/28/15   Renato Shin, MD  azithromycin (ZITHROMAX) 500 MG tablet Take 1 tablet (500 mg total) by mouth daily. Patient not taking: Reported on 04/24/2015 04/19/15   Renato Shin, MD  gabapentin (NEURONTIN) 100 MG capsule Take 1 capsule (100 mg total) by mouth at bedtime. 05/12/15   Nita Sells, MD  gabapentin (NEURONTIN) 300 MG capsule TAKE ONE CAPSULE BY MOUTH EVERY NIGHT AT BEDTIME 05/09/15   Renato Shin, MD  hydrALAZINE (APRESOLINE) 25 MG tablet TAKE 1 TABLET BY MOUTH THREE TIMES DAILY 04/28/15   Renato Shin, MD  HYDROcodone-acetaminophen (NORCO) 10-325 MG tablet Take 1 tablet by mouth every 6 (six) hours as needed for moderate pain. 05/12/15   Nita Sells, MD  isosorbide-hydrALAZINE (BIDIL) 20-37.5 MG tablet Take 1 tablet by mouth 3 (three) times daily. 03/14/15   Mihai Croitoru, MD  ONE TOUCH ULTRA TEST test strip USE TO TEST BLOOD SUGAR TWICE DAILY. 11/30/14   Renato Shin, MD  pantoprazole (PROTONIX) 40 MG tablet TAKE 1 TABLET BY MOUTH TWICE DAILY 04/28/15   Renato Shin, MD  sertraline (ZOLOFT) 100 MG tablet TAKE 1 TABLET BY MOUTH EVERY DAY 04/28/15   Renato Shin, MD     Family History  Problem Relation Age of Onset  . Cancer - Other Mother     "Stomach" Cancer   . Diabetes Mother   . Heart disease Mother   . Stomach cancer Mother   . Hypertension Mother   . Lymphoma Father   . Hypertension Father   . Kidney disease Paternal Grandmother   . Asthma Other   . Diabetes Sister   . Rheum arthritis Mother   . Rheum arthritis Father   . Heart attack Neg Hx   . Stroke Paternal Grandmother     Social History   Social History  . Marital Status: Married    Spouse Name: N/A  . Number of Children: 7  . Years of Education: N/A   Occupational History  . Retired    Social History Main Topics  . Smoking status: Never Smoker   . Smokeless tobacco: Never Used  . Alcohol Use: No     Comment: rare  . Drug Use: No  . Sexual Activity: No   Other Topics Concern  . None   Social History Narrative    Review of Systems  Constitutional: Positive for activity change and fatigue. Negative for fever, chills and appetite change.  HENT: Negative.   Respiratory: Negative.   Cardiovascular: Negative.  Negative for chest pain.  Gastrointestinal: Negative.  Negative for nausea, vomiting and abdominal pain.  Genitourinary: Negative.   Musculoskeletal: Negative.   Skin: Negative.   Psychiatric/Behavioral: Negative.     Vital Signs: BP 164/72 mmHg  Pulse 70  Temp(Src) 98.4 F (36.9 C) (Oral)  Resp 17  Ht $R'5\' 6"'EL$  (1.676 m)  Wt 163 lb 9.3 oz (74.2 kg)  BMI 26.42 kg/m2  SpO2 98%  Physical Exam  Constitutional: She is oriented to person, place, and time. She appears well-developed and well-nourished.  HENT:  Head: Normocephalic and atraumatic.  Eyes: EOM are normal.  Neck: Normal range of motion. Neck supple.  Cardiovascular: Normal rate, regular rhythm and normal heart sounds.   Pulmonary/Chest: Effort normal.  Abdominal: Soft. Bowel sounds are  normal. She exhibits no distension. There is no tenderness.  Musculoskeletal: Normal range of motion.  Neurological: She is alert and oriented to person, place, and time.  Skin: Skin is warm and dry.    Psychiatric: She has a normal mood and affect. Her behavior is normal. Judgment and thought content normal.  Vitals reviewed. Left arm with new brachiocephalic fistula Temporary cath in the right upper chest area.  Mallampati Score:  MD Evaluation Airway: WNL Heart: WNL Abdomen: WNL Chest/ Lungs: WNL ASA  Classification: 3 Mallampati/Airway Score: One  Imaging: Dg Chest 2 View  04/26/2015  CLINICAL DATA:  Shortness of breath. EXAM: CHEST  2 VIEW COMPARISON:  04/24/2015. FINDINGS: Cardiomegaly with pulmonary vascular prominence and mild interstitial prominence, improved from prior exam. Findings consistent with improving congestive heart failure. Cardiomegaly remains severe. Underlying process such as pericardial effusion, cardiomyopathy, or cardiac valve disease cannot be excluded . No pleural effusion or pneumothorax . IMPRESSION: 1. Improving congestive heart failure. 2. Persistent severe cardiomegaly. Underlying process such as pericardial fusion, cardiomyopathy, cardiac valve disease cannot be excluded . Electronically Signed   By: Marcello Moores  Register   On: 04/26/2015 07:47   Dg Chest 2 View  04/24/2015  CLINICAL DATA:  Five day history of upper urinary tract infection. No improvement after antibiotics. Productive cough. EXAM: CHEST  2 VIEW COMPARISON:  04/19/2015 FINDINGS: The heart is enlarged but appears relatively stable. There is tortuosity and calcification of the thoracic aorta. Diffuse bilateral airspace process could reflect pulmonary edema or bilateral infiltrates. No definite pleural effusions. The bony thorax is intact. IMPRESSION: Cardiac enlargement. Diffuse airspace process could reflect pulmonary edema or bilateral infiltrates. No definite pleural effusions. Electronically Signed   By: Marijo Sanes M.D.   On: 04/24/2015 17:16   Dg Chest 2 View  04/19/2015  CLINICAL DATA:  Cough for 1 week.  Wheezing for 2 days.  Congestion. EXAM: CHEST  2 VIEW COMPARISON:  03/06/2015  FINDINGS: There is cardiomegaly with vascular congestion and mild perihilar opacities concerning for early edema. No effusions. No acute bony abnormality. IMPRESSION: Findings concerning for mild CHF. Electronically Signed   By: Rolm Baptise M.D.   On: 04/19/2015 12:42   US Renal  05/01/2015  CLINICAL DATA:  Renal failure. EXAM: RENAL / URINARY TRACT ULTRASOUND COMPLETE COMPARISON:  CT, 12/05/2013 FINDINGS: Right Kidney: Length: 10.6 cm. Echogenicity within normal limits. No mass or hydronephrosis visualized. Left Kidney: Length: 10.3 cm. Echogenicity within normal limits. No mass or hydronephrosis visualized. Bladder: Decompressed with a Foley catheter IMPRESSION: Normal renal ultrasound. Electronically Signed   By: Lajean Manes M.D.   On: 05/01/2015 12:46   Ir Fluoro Guide Cv Line Right  05/02/2015  INDICATION: Acute on chronic renal insufficiency. Please place temporary dialysis catheter for the initiation of dialysis. EXAM: NON-TUNNELED CENTRAL VENOUS HEMODIALYSIS CATHETER PLACEMENT WITH ULTRASOUND AND FLUOROSCOPIC GUIDANCE COMPARISON:  None. MEDICATIONS: None CONTRAST:  None FLUOROSCOPY TIME:  24 seconds (40.3 mGy) COMPLICATIONS: None immediate PROCEDURE: Informed written consent was obtained from the patient after a discussion of the risks, benefits, and alternatives to treatment. Questions regarding the procedure were encouraged and answered. The right neck and chest were prepped with chlorhexidine in a sterile fashion, and a sterile drape was applied covering the operative field. Maximum barrier sterile technique with sterile gowns and gloves were used for the procedure. A timeout was performed prior to the initiation of the procedure. After creating a small venotomy incision, a micropuncture kit was utilized to access the right internal jugular  vein under direct, real-time ultrasound guidance after the overlying soft tissues were anesthetized with 1% lidocaine with epinephrine. Ultrasound image  documentation was performed. The microwire was kinked to measure appropriate catheter length. A stiff glidewire was advanced to the level of the IVC. Under fluoroscopic guidance, the venotomy was serially dilated, ultimately allowing placement of a 20 cm temporary Trialysis catheter with tip ultimately terminating within the superior aspect of the right atrium. Final catheter positioning was confirmed and documented with a spot radiographic image. The catheter aspirates and flushes normally. The catheter was flushed with appropriate volume heparin dwells. The catheter exit site was secured with a 0-Prolene retention suture. A dressing was placed. The patient tolerated the procedure well without immediate post procedural complication. IMPRESSION: Successful placement of a right internal jugular approach 20 cm temporary dialysis catheter with tip terminating with in the superior aspect of the right atrium. This catheter may be converted to a tunneled dialysis catheter at a later date as indicated. The catheter is ready for immediate use. Electronically Signed   By: Sandi Mariscal M.D.   On: 05/02/2015 09:53   Ir US Guide Vasc Access Right  05/02/2015  INDICATION: Acute on chronic renal insufficiency. Please place temporary dialysis catheter for the initiation of dialysis. EXAM: NON-TUNNELED CENTRAL VENOUS HEMODIALYSIS CATHETER PLACEMENT WITH ULTRASOUND AND FLUOROSCOPIC GUIDANCE COMPARISON:  None. MEDICATIONS: None CONTRAST:  None FLUOROSCOPY TIME:  24 seconds (16.1 mGy) COMPLICATIONS: None immediate PROCEDURE: Informed written consent was obtained from the patient after a discussion of the risks, benefits, and alternatives to treatment. Questions regarding the procedure were encouraged and answered. The right neck and chest were prepped with chlorhexidine in a sterile fashion, and a sterile drape was applied covering the operative field. Maximum barrier sterile technique with sterile gowns and gloves were used for  the procedure. A timeout was performed prior to the initiation of the procedure. After creating a small venotomy incision, a micropuncture kit was utilized to access the right internal jugular vein under direct, real-time ultrasound guidance after the overlying soft tissues were anesthetized with 1% lidocaine with epinephrine. Ultrasound image documentation was performed. The microwire was kinked to measure appropriate catheter length. A stiff glidewire was advanced to the level of the IVC. Under fluoroscopic guidance, the venotomy was serially dilated, ultimately allowing placement of a 20 cm temporary Trialysis catheter with tip ultimately terminating within the superior aspect of the right atrium. Final catheter positioning was confirmed and documented with a spot radiographic image. The catheter aspirates and flushes normally. The catheter was flushed with appropriate volume heparin dwells. The catheter exit site was secured with a 0-Prolene retention suture. A dressing was placed. The patient tolerated the procedure well without immediate post procedural complication. IMPRESSION: Successful placement of a right internal jugular approach 20 cm temporary dialysis catheter with tip terminating with in the superior aspect of the right atrium. This catheter may be converted to a tunneled dialysis catheter at a later date as indicated. The catheter is ready for immediate use. Electronically Signed   By: Sandi Mariscal M.D.   On: 05/02/2015 09:53   Dg Chest Port 1 View  05/03/2015  CLINICAL DATA:  Shortness of Breath EXAM: PORTABLE CHEST 1 VIEW COMPARISON:  04/28/2015 FINDINGS: Cardiomediastinal silhouette is stable. Dual lumen right IJ catheter with tip in right atrium. Central mild vascular congestion and mild perihilar interstitial prominence suspicious for mild interstitial edema. No segmental infiltrate. IMPRESSION: Cardiomegaly again noted. Dual lumen right IJ catheter with tip in right atrium.  Central mild  vascular congestion and mild perihilar interstitial prominence suspicious for mild interstitial edema. Electronically Signed   By: Lahoma Crocker M.D.   On: 05/03/2015 11:36   Dg Chest Port 1 View  04/28/2015  CLINICAL DATA:  Cough, wheezing for 2 weeks. EXAM: PORTABLE CHEST 1 VIEW COMPARISON:  04/26/2015 FINDINGS: Cardiomegaly. Decreasing lung volumes with increasing perihilar and lower lobe airspace opacities concerning for CHF. No effusions. No acute bony abnormality. IMPRESSION: Cardiomegaly with increasing bilateral airspace disease, likely edema/ CHF. Low lung volumes. Electronically Signed   By: Rolm Baptise M.D.   On: 04/28/2015 11:11   Dg Swallowing Func-speech Pathology  05/03/2015  Objective Swallowing Evaluation:   MBS Patient Details Name: [name] MRN: [id number] Date of Birth: 04/27/1930 Today's Date: 05/03/2015 Time: SLP Start Time (ACUTE ONLY): 1340-SLP Stop Time (ACUTE ONLY): 1400 SLP Time Calculation (min) (ACUTE ONLY): 20 min Past Medical History: Past Medical History Diagnosis Date . Uterine cancer ([place])  . Diabetes mellitus    type II; peripheral neuropathy . Hypertension  . GERD (gastroesophageal reflux disease)  . Peripheral vascular disease ([place])    a. s/p L BKA 08/2012. . Obesity  . Dyslipidemia  . Chronic combined systolic and diastolic CHF (congestive heart failure) (HCC)    a. EF 50-55%, mild LVH and grade 1 diast. Dysfxn b. Grade 3 Diastolic Dysfunction 02/3234. b. 02/2015: EF 45-50% by cath. . Osteoarthritis cervical spine  . DM retinopathy ([place])  . Sleep apnea    with CPAP . History of shingles  . COPD (chronic obstructive pulmonary disease) ([place])  . Depression  . Peptic ulcer disease    duodenal . Peptic stricture of esophagus  . Hiatal hernia  . Diverticulosis  . Arthritis  . CAD (coronary artery disease)    a. s/p multiple caths with nonobs CAD;   b. cath 1/10: pLAD 20%, mLAD 40%, pCFX 20%, mCFX 40%, pRCA 60-70%;   c.  Myoview 06/02/12: Low anterior wall scar, no  ischemia, EF 37%. d. cath 06/20/2014 70% mid LCx, medical therapy, high risk for PCI due to close proximity to large OM e. cath 03/07/15 pro RCA 50% & pro to mid Cx 75%, both stable, LVEDP 33-24mm Hg-> medical management . Cardiomyopathy ([place])    a. Echo 05/31/12: EF 40-45%. b. Echo 06/2014: EF 50-55%. c. Cath 02/2015: EF 45-50%. . Asthma    Mild . Pruritic condition    Idiopathic . Zoster  . Noncompliance  . CKD (chronic kidney disease), stage III    Dr. Lorrene Reid . Bradycardia    a. Not on BB due to this. . Shortness of breath dyspnea  Past Surgical History: Past Surgical History Procedure Laterality Date . Tubal ligation  1967 . Knee arthroscopy  10/1998   Left . Craniotomy  1997   Left for SDH . Cataract extraction, bilateral  2005 . Hernia repair   . Esophagogastroduodenoscopy  04/04/2004 . Spine surgery     C-spine and lumbar surgery . Cholecystectomy  2010 . Cardiac catheterization   . Dexa  7/05 . Amputation Left 09/04/2012   Procedure: AMPUTATION BELOW KNEE;  Surgeon: Angelia Mould, MD;  Location: [place];  Service: Vascular;  Laterality: Left; . Abdominal angiogram N/A 08/31/2012   Procedure: ABDOMINAL ANGIOGRAM with runoff poss intervention;  Surgeon: Angelia Mould, MD;  Location: [facility] CATH LAB;  Service: Cardiovascular;  Laterality: N/A; . Tubal ligation   . Left heart catheterization with coronary angiogram N/A 06/22/2014   Procedure: LEFT HEART CATHETERIZATION WITH  CORONARY ANGIOGRAM;  Surgeon: Burnell Blanks, MD;  Location: [facility] CATH LAB;  Service: Cardiovascular;  Laterality: N/A; . Eye surgery   . Brain surgery     1997 blood clot on the brain, then had to relieve fluid on the brain . Multiple extractions with alveoloplasty N/A 08/01/2014   Procedure: EXTRACTIONS OF TEETH NUMBERS $RemoveBefor'7 8 9 10 11 'sMtNmtYinAhF$ AND 19 AND ALVEOLOPLASTY UPPER LEFT AND RIGHT QUADRANT;  Surgeon: Isac Caddy, DDS;  Location: [place];  Service: Oral Surgery;  Laterality: N/A; . Cardiac catheterization N/A 03/07/2015   Procedure:  Left Heart Cath and Coronary Angiography;  Surgeon: Leonie Man, MD;  Location: [place] CV LAB;  Service: Cardiovascular;  Laterality: N/A; HPI: [name] is an 81 y.o. female patient with history of chronic combined CHF (EF 45-50 percent & grade 3 diastolic dysfunction by [facility] 02/2015), CAD, DM 2, HTN, HLD, OSA on nightly CPAP, PAD status post left BKA, stage III CKD, COPD/? Asthma, bradycardia (hence not on beta blockers), recent hospitalization 9/19-9/22 for unstable angina at which time cath showed stable anatomy, now presented to [facility] ED on 04/24/15 with worsening dyspnea, orthopnea, productive cough, pleuritic appearing chest pain but no fevers. Recently seen by PCP and started on azithromycin for suspected pneumonia & Lasix dose increased. Noncompliant with fluid and water restriction. Admitted for decompensated CHF & question of pneumonia. Per MD notes, on 11/11 patient began to c/o coughing while eating. MBS recommended after swallow session this am.  No Data Recorded Assessment / Plan / Recommendation CHL IP CLINICAL IMPRESSIONS 05/03/2015 Therapy Diagnosis Mild pharyngeal phase dysphagia Clinical Impression Mild pharyngeal dysphagia as evidenced by delayed swallow initiation to pyriform sinuses due to decreased pharyngeal sensation. Mild pyriform sinus residue due to decreased laryngeal elevation cleared with verbal cues for second swallow. Pt coughed during MBS without barium observed in laryngeal vestibule during study. Recommend pt continue regular texture diet, thin liquids, straws allowed, small sips and bites, pills with thin (one at a time). Follow up x 1 for education.     CHL IP TREATMENT RECOMMENDATION 05/03/2015 Treatment Recommendations Therapy as outlined in treatment plan below   CHL IP DIET RECOMMENDATION 05/03/2015 SLP Diet Recommendations Regular solids;Thin liquid Liquid Administration via Cup;Straw Medication Administration Whole meds with liquid Compensations Slow  rate;Small sips/bites;Multiple dry swallows after each bite/sip Postural Changes Seated upright at 90 degrees   CHL IP OTHER RECOMMENDATIONS 05/03/2015 Recommended Consults -- Oral Care Recommendations Oral care BID Other Recommendations --   CHL IP FOLLOW UP RECOMMENDATIONS 05/03/2015 Follow up Recommendations None   CHL IP FREQUENCY AND DURATION 05/03/2015 Speech Therapy Frequency (ACUTE ONLY) min 1 x/week Treatment Duration 1 week      CHL IP ORAL PHASE 05/03/2015 Oral Phase WFL Oral - Pudding Teaspoon -- Oral - Pudding Cup -- Oral - Honey Teaspoon -- Oral - Honey Cup -- Oral - Nectar Teaspoon -- Oral - Nectar Cup -- Oral - Nectar Straw -- Oral - Thin Teaspoon -- Oral - Thin Cup -- Oral - Thin Straw -- Oral - Puree -- Oral - Mech Soft -- Oral - Regular -- Oral - Multi-Consistency -- Oral - Pill -- Oral Phase - Comment --  CHL IP PHARYNGEAL PHASE 05/03/2015 Pharyngeal Phase Thin;Solids Pharyngeal- Pudding Teaspoon -- Pharyngeal -- Pharyngeal- Pudding Cup -- Pharyngeal -- Pharyngeal- Honey Teaspoon -- Pharyngeal -- Pharyngeal- Honey Cup -- Pharyngeal -- Pharyngeal- Nectar Teaspoon -- Pharyngeal -- Pharyngeal- Nectar Cup -- Pharyngeal -- Pharyngeal- Nectar Straw -- Pharyngeal -- Pharyngeal-  Thin Teaspoon NT Pharyngeal -- Pharyngeal- Thin Cup Delayed swallow initiation-pyriform sinuses;Pharyngeal residue - pyriform;Reduced laryngeal elevation Pharyngeal -- Pharyngeal- Thin Straw Delayed swallow initiation-pyriform sinuses;Pharyngeal residue - pyriform;Reduced laryngeal elevation Pharyngeal -- Pharyngeal- Puree Delayed swallow initiation-vallecula;Pharyngeal residue - pyriform;Reduced laryngeal elevation Pharyngeal -- Pharyngeal- Mechanical Soft NT Pharyngeal -- Pharyngeal- Regular Delayed swallow initiation-vallecula Pharyngeal -- Pharyngeal- Multi-consistency NT Pharyngeal -- Pharyngeal- Pill Delayed swallow initiation-vallecula Pharyngeal -- Pharyngeal Comment --  CHL IP CERVICAL ESOPHAGEAL PHASE 05/03/2015  Cervical Esophageal Phase WFL Pudding Teaspoon -- Pudding Cup -- Honey Teaspoon -- Honey Cup -- Nectar Teaspoon -- Nectar Cup -- Nectar Straw -- Thin Teaspoon -- Thin Cup -- Thin Straw -- Puree -- Mechanical Soft -- Regular -- Multi-consistency -- Pill -- Cervical Esophageal Comment -- [place] Siren 05/03/2015, 4:07 PM  Orbie Pyo Colvin Caroli.Ed CCC-SLP Pager [phone]              Labs:  CBC:  Recent Labs  05/07/15 1145 05/09/15 0940 05/10/15 0422 05/12/15 0831  WBC 12.2* 13.1* 15.0* 10.7*  HGB 11.0* 11.0* 11.5* 9.9*  HCT 34.1* 35.9* 37.7 32.0*  PLT 279 263 241 221    COAGS:  Recent Labs  06/17/14 1538 06/17/14 1946 06/18/14 0625 03/07/15 0034 05/02/15 0239  INR 1.06 1.19 1.18 1.23 1.52*  APTT 32 128*  --   --  32    BMP:  Recent Labs  05/07/15 1145 05/08/15 0541 05/09/15 0941 05/10/15 0422 05/12/15 0830  NA 137  --  136 137  136 135  K 3.6  --  3.7 3.8  3.8 4.2  CL 98*  --  98* 97*  97* 96*  CO2 30  --  $R'29 28  28 31  'bq$ GLUCOSE 258*  --  202* 96  95 205*  BUN 49*  --  57* 26*  26* 64*  CALCIUM 8.2*  --  8.5* 8.4*  8.3* 8.6*  CREATININE 3.20* 2.87* 3.60* 2.72*  2.69* 3.88*  GFRNONAA 12* 14* 11* 15*  15* 10*  GFRAA 14* 16* 12* 18*  18* 11*    LIVER FUNCTION TESTS:  Recent Labs  06/17/14 1946 08/18/14 1119 10/25/14 1114 04/24/15 1738  05/07/15 1145 05/09/15 0941 05/10/15 0422 05/12/15 0830  BILITOT 1.2 0.4 0.6 1.0  --   --   --   --   --   AST 34 21 17 45*  --   --   --   --   --   ALT $Re'22 11 10 'pvG$ 32  --   --   --   --   --   ALKPHOS 103 90 90 111  --   --   --   --   --   PROT 6.9 6.8 6.6 6.3*  --   --   --   --   --   ALBUMIN 3.8 4.0 3.7 3.3*  < > 2.6* 2.8* 3.0* 2.8*  < > = values in this interval not displayed.  TUMOR MARKERS: No results for input(s): AFPTM, CEA, CA199, CHROMGRNA in the last 8760 hours.  Assessment and Plan:  ESRD with need for hemodialysis Will exchange temp cath with tunneled cath on Monday by Dr.  Barbie Banner  Risks and Benefits discussed with the patient including, but not limited to bleeding, infection, pneumothorax, or fibrin sheath development and need for additional procedures.  All of the patient's questions were answered, patient is agreeable to proceed. Consent signed and in chart.  Thank you for this interesting consult.  I  greatly enjoyed meeting [name] and look forward to participating in their care.  A copy of this report was sent to the requesting provider on this date.  Signed: Murrell Redden PA-C 05/12/2015, 1:59 PM   I spent a total of 20 Minutes in face to face in clinical consultation, greater than 50% of which was counseling/coordinating care for placement of tunneled dialysis catheter.

## 2015-05-12 NOTE — Progress Notes (Signed)
Family member approaches nurses station to report pt rings are missing from when she went to have fistula placed on 11/23. Security called and arrived to 3e to take report. Husband returned to bedside to report rings were taken home. Issue resolved. No need for security to take report.

## 2015-05-12 NOTE — Progress Notes (Signed)
OT Cancellation Note  Patient Details Name: [name] MRN: [id number] DOB: [birth year]   Cancelled Treatment:    Reason Eval/Treat Not Completed: Patient at procedure or test/ unavailable. Pt currently in diaylsis and to D/C SNF post this.  Almon Register [id number] 05/12/2015, 11:37 AM

## 2015-05-12 NOTE — Progress Notes (Signed)
Thank you for consult on [name].  PT evaluation attempted but patient was unable to participate due to recent procedure. Chart reviewed and Last PT note 11/18 showed limited activity tolerance. She was unable to tolerate PT attempts 11/23 due to surgical procedure.  Recommend SNF for follow up therapy.

## 2015-05-12 NOTE — Progress Notes (Signed)
Patient does not wish to wear CPAP tonight. She stated the mask was very uncomfortable RT will continue to monitor.

## 2015-05-12 NOTE — Progress Notes (Signed)
Pt tolerated tx well.  Report called to bedside RN.  Catheter flushed and heparin locked with new caps placed.  Goal of 3361mL ultrafiltrated with net of 2L.

## 2015-05-12 NOTE — Progress Notes (Signed)
PT Cancellation Note  Patient Details Name: [name] MRN: [id number] DOB: January 28, 1930   Cancelled Treatment:    Reason Eval/Treat Not Completed: Other (comment) (pt declined, fatigue). Checked on pt after HD and she reported she was too fatigued to work with therapy in or out of bed.   [place], [place] 05/12/2015, 3:18 PM

## 2015-05-13 LAB — GLUCOSE, CAPILLARY
GLUCOSE-CAPILLARY: 172 mg/dL — AB (ref 65–99)
GLUCOSE-CAPILLARY: 173 mg/dL — AB (ref 65–99)
GLUCOSE-CAPILLARY: 236 mg/dL — AB (ref 65–99)
GLUCOSE-CAPILLARY: 247 mg/dL — AB (ref 65–99)
Glucose-Capillary: 133 mg/dL — ABNORMAL HIGH (ref 65–99)

## 2015-05-13 MED ORDER — DARBEPOETIN ALFA 100 MCG/0.5ML IJ SOSY
100.0000 ug | PREFILLED_SYRINGE | INTRAMUSCULAR | Status: DC
  Filled 2015-05-13: qty 0.5

## 2015-05-13 NOTE — Progress Notes (Signed)
Subjective: Interval History: has no complaint, weak.  Objective: Vital signs in last 24 hours: Temp:  [98.2 F (36.8 C)-98.4 F (36.9 C)] 98.3 F (36.8 C) (11/26 0455) Pulse Rate:  [66-73] 73 (11/26 0455) Resp:  [16-19] 18 (11/26 0455) BP: (95-164)/(48-72) 115/54 mmHg (11/26 0455) SpO2:  [95 %-100 %] 97 % (11/26 1008) Weight:  [73.846 kg (162 lb 12.8 oz)-74.2 kg (163 lb 9.3 oz)] 73.846 kg (162 lb 12.8 oz) (11/26 0455) Weight change: 4.6 kg (10 lb 2.3 oz)  Intake/Output from previous day: 11/25 0701 - 11/26 0700 In: 537 [P.O.:537] Out: 2000  Intake/Output this shift: Total I/O In: 460 [P.O.:460] Out: 0   General appearance: alert, cooperative and moderately obese Neck: R Ij temp cath Resp: diminished breath sounds bilaterally and rales bibasilar Cardio: S1, S2 normal and systolic murmur: holosystolic 2/6, blowing at apex GI: pos bs, soft, liver down 5 cm Extremities: edema 1+ and L BKA, LUA AVF  Lab Results:  Recent Labs  05/12/15 0831  WBC 10.7*  HGB 9.9*  HCT 32.0*  PLT 221   BMET:  Recent Labs  05/12/15 0830  NA 135  K 4.2  CL 96*  CO2 31  GLUCOSE 205*  BUN 64*  CREATININE 3.88*  CALCIUM 8.6*   No results for input(s): PTH in the last 72 hours. Iron Studies: No results for input(s): IRON, TIBC, TRANSFERRIN, FERRITIN in the last 72 hours.  Studies/Results: No results found.  I have reviewed the patient's current medications.  Assessment/Plan: 1 ESRD new.  Hd yest. Failed to be able to sit up in chair.  Needs PC also 2 DM controlled 3 anemia needs esa, on Fe 4 HPTH on meds 5 CAD 6 PVD 7 Debill primary issue now P HD, esa, Fe, Vit D , PC, mobilize   LOS: 19 days   Evalette Montrose L 05/13/2015,11:01 AM

## 2015-05-13 NOTE — Progress Notes (Signed)
Physical Therapy Treatment Patient Details Name: [name] MRN: [id number] DOB: 10/10/1929 Today's Date: 05/13/2015    History of Present Illness [name] is a 81 y.o. female with history of nonobstructive CAD who has been recently admitted in September 2016 for unstable angina and at that time patient had unremarkable cardiac cath and patient had medications adjusted presents to the ER as patient has been having increasing shortness of breath over the last 1 week. Patient also has been having some productive cough with greenish sputum. Patient was placed on azithromycin by patient's primary care physician last week with increasing dose of Lasix from 40 mg daily to twice daily. Despite this patient has been getting short of breath even at rest. PMH includes L BKA, DM, heart failure, HTN, NSTEMI    PT Comments    Pt admitted with above diagnosis. Pt currently with functional limitations due to balance and endurance deficits. Pt was able to stand aand pivot and is becoming more controlled daily.  Still needs SNf for therapy prior to d/c home.   Pt will benefit from skilled PT to increase their independence and safety with mobility to allow discharge to the venue listed below.    Follow Up Recommendations  SNF     Equipment Recommendations  None recommended by PT    Recommendations for Other Services       Precautions / Restrictions Precautions Precautions: Fall Precaution Comments: L BKA with prosthesis Restrictions Weight Bearing Restrictions: No    Mobility  Bed Mobility Overal bed mobility: Needs Assistance Bed Mobility: Supine to Sit     Supine to sit: Min assist;HOB elevated     General bed mobility comments: trunk support and slid out on bed pad. Had to reinflate bed for pt to sit EOB as the bed deflates strangely.   Transfers Overall transfer level: Needs assistance Equipment used: Rolling walker (2 wheeled) (LLE prosthesis with 2  socks) Transfers: Sit to/from [facility] Sit to Stand: Max assist;+2 physical assistance;+2 safety/equipment Stand pivot transfers: Mod assist;+2 physical assistance;From elevated surface       General transfer comment: Pt was able to get balance once up and she obtained BOS.  Pt took pivotal steps with assist for weight shifting and cues to stand tall as she tends to flex at hips and knees.  Pt needed asssit to control descent into chair.   Ambulation/Gait             General Gait Details: transfers to chair only due to fatigue   Stairs            Wheelchair Mobility    Modified Rankin (Stroke Patients Only)       Balance Overall balance assessment: Needs assistance Sitting-balance support: Feet supported;Bilateral upper extremity supported Sitting balance-Leahy Scale: Poor Sitting balance - Comments: did fluctutate and lean back at times Postural control: Posterior lean Standing balance support: Bilateral upper extremity supported;During functional activity Standing balance-Leahy Scale: Poor Standing balance comment: requires UE support for balance with RW.                     Cognition Arousal/Alertness: Awake/alert Behavior During Therapy: WFL for tasks assessed/performed Overall Cognitive Status: Within Functional Limits for tasks assessed                      Exercises      General Comments General comments (skin integrity, edema, etc.): Still struggles with postural stability.  Pertinent Vitals/Pain Pain Assessment: No/denies pain  VSS    Home Living                      Prior Function            PT Goals (current goals can now be found in the care plan section) Progress towards PT goals: Progressing toward goals    Frequency  Min 3X/week    PT Plan Current plan remains appropriate    Co-evaluation             End of Session Equipment Utilized During Treatment: Gait  belt;Oxygen Activity Tolerance: Patient tolerated treatment well Patient left: in chair;with call bell/phone within reach;with chair alarm set     Time: 1014-1030 PT Time Calculation (min) (ACUTE ONLY): 16 min  Charges:  $Therapeutic Activity: 8-22 mins                    G CodesDenice Paradise May 14, 2015, 2:09 PM [place] Rico,PT Acute Rehabilitation [phone] (pager)

## 2015-05-13 NOTE — Progress Notes (Signed)
[facility] Progress Note   [name]  [id number]  DOB: 25-Jul-1929  DOA: 04/24/2015 PCP: Renato Shin, MD  Brief narrative: 81 y/o ?  chronic kidney disease stage III,  coronary artery disease-NSTEAMI 06/2014-recent cath 02/2015 neg PVD s/p L BKA 0000000 chronic systolic and diastolic heart failure EF 45-50% by [facility] 02/2015,  obstructive sleep apnea,/COPD>?   type 2 diabetes mellitus on insulin  Chr Stg 2 pressure ulcer R buttocks  presents to the hospital for shortness of breath and cough and is found to have pulmonary edema.   She was recently treated with Zithromax and increased dose of Lasix by her PCP however continued to be short of breath at rest. She was started on diuretics after admission however creatinine rose steadily without much results and diuresis.  Nephrology consulted and it was decided for her to undergo dialysis to help remove excess fluid.  Underwent creation L Brachio-ceph AV fistula 05/10/15 PErm Cath placement scheudled for 11/28 am   Subjective:  Sitting oob in chair no cp  No fever Had BM No sob No n/v No f/chills  Assessment/Plan:    Acute respiratory failure with hypoxia -  Acute on chronic diastolic heart failure- AKI - significant rise in creatinine with diuresis - dialysis started on 11/15  - left arm brachio-ceph fistula done 05/10/15-L arm looks clean and Vascular feels patient stable for d/c  Active Problems: HCAP? - has had a cough with congestion for 2 wks now - CXR obtained after fluid has been dialyzed off does not reveal infiltrates consistent with pneumonia - initially received Vancomycin and Ceftaz and then transitioned to Levaquin- cough unfortunately not improving with antibiotics- completed 14 days  - of note the patient never had leukocytosis or fevers - SLP eval- D 3 diet with thin liquids -liberalized diet x 1 to allow for more calories-would back down to dys 3 diet if cough or other issues -added  flutter valve on 11/24   Junctional rhythm/ NSVT - junctional rhythm resolved with holding B-Blocker -currently in NSR on monitors - management per cardiology, they signed off on 11/22  DM (diabetes mellitus), type 2, uncontrolled, with renal complications   + neuropathy - uses 70/30 insulin at home at a dose of 14 U only at breakfast - has been started on Levemir 16 U and Novolog 5 U c Meals plus mealtime supplmental coverage in the hospital - she was hypoglycemic on 11/17- Levemir dosage decreased- sugars improved -sugars 172-236 -Continue Gabapentin 300 daily  Hyperlipidemia -cont atorvastatin 40 and Zetia 10 daily -2/2 prevention for lipid rich plaque and further PVD necessitates aggressive lipis control  Coronary artery disease due to lipid rich plaque; 70% ostial-proximal AV Groove Cx - not good PCI target -cont Imdur, Ranexa, statin and Aspirin  PVD s/p L BKA + Pressure ulcers -stump appears clean We will reassess wounds on bottom prior to d/c home  Anemia of chronic disease - stable -continue Nulecit 125 c mwf dialysis   Code Status:     Code Status Orders        Start     Ordered   04/24/15 2353  Full code   Continuous     04/24/15 2354     Family Communication: d/w family bedside 05/13/15 Disposition Plan: SNF recommended-she needs dialysis CLIP as well as PC placement which will be 11/28 DVT prophylaxis: heparin Consultants:cardiology, nephrology Procedures:   Objective: Filed Weights   05/12/15 0758 05/12/15 1210 05/13/15 0455  Weight: 77.6 kg (171 lb  1.2 oz) 74.2 kg (163 lb 9.3 oz) 73.846 kg (162 lb 12.8 oz)    Intake/Output Summary (Last 24 hours) at 05/13/15 1407 Last data filed at 05/13/15 1334  Gross per 24 hour  Intake   1107 ml  Output      0 ml  Net   1107 ml     Vitals Filed Vitals:   05/12/15 2117 05/13/15 0455 05/13/15 1008 05/13/15 1136  BP:  115/54  132/56  Pulse:  73  73  Temp:  98.3 F (36.8 C)  98.3 F (36.8 C)   TempSrc:  Oral  Oral  Resp:  18  18  Height:      Weight:  73.846 kg (162 lb 12.8 oz)    SpO2: 95% 100% 97% 100%    Exam:  General:  Pt is alert, not in acute distress  HEENT: No icterus, No thrush, oral mucosa moist, poor dentition  Cardiovascular: regular rate and rhythm, S1/S2 No murmur  Respiratory: CTA b/l  Abdomen: Soft, +Bowel sounds, non tender, non distended, no guarding  MSK: No LE edema, cyanosis or clubbing  Skin fistula looks clean. Pressure ulcers  examined  On 11/25 showed one large ~ 5 cm stg 1 and a smaller stg2 with skin denudation  Data Reviewed: Basic Metabolic Panel:  Recent Labs Lab 05/07/15 0835 05/07/15 1145 05/08/15 0541 05/09/15 0941 05/10/15 0422 05/12/15 0830  NA 138 137  --  136 137  136 135  K 3.6 3.6  --  3.7 3.8  3.8 4.2  CL 101 98*  --  98* 97*  97* 96*  CO2 29 30  --  29 28  28 31   GLUCOSE 176* 258*  --  202* 96  95 205*  BUN 46* 49*  --  57* 26*  26* 64*  CREATININE 3.20* 3.20* 2.87* 3.60* 2.72*  2.69* 3.88*  CALCIUM 8.3* 8.2*  --  8.5* 8.4*  8.3* 8.6*  PHOS 4.0 4.0  --  3.9 3.9 5.4*   Liver Function Tests:  Recent Labs Lab 05/07/15 0835 05/07/15 1145 05/09/15 0941 05/10/15 0422 05/12/15 0830  ALBUMIN 2.7* 2.6* 2.8* 3.0* 2.8*   No results for input(s): LIPASE, AMYLASE in the last 168 hours. No results for input(s): AMMONIA in the last 168 hours. CBC:  Recent Labs Lab 05/07/15 1145 05/09/15 0940 05/10/15 0422 05/12/15 0831  WBC 12.2* 13.1* 15.0* 10.7*  HGB 11.0* 11.0* 11.5* 9.9*  HCT 34.1* 35.9* 37.7 32.0*  MCV 87.9 84.5 83.6 84.7  PLT 279 263 241 221   Cardiac Enzymes: No results for input(s): CKTOTAL, CKMB, CKMBINDEX, TROPONINI in the last 168 hours. BNP (last 3 results)  Recent Labs  06/17/14 1330 06/17/14 1946 04/24/15 1738  BNP 1951.5* 1985.7* 1594.7*    ProBNP (last 3 results)  Recent Labs  06/08/14 1617  PROBNP 1249.0*    CBG:  Recent Labs Lab 05/12/15 1622 05/12/15 2131  05/13/15 0451 05/13/15 0729 05/13/15 1133  GLUCAP 194* 84 172* 173* 236*    Recent Results (from the past 240 hour(s))  Surgical pcr screen     Status: None   Collection Time: 05/09/15  5:15 PM  Result Value Ref Range Status   MRSA, PCR NEGATIVE NEGATIVE Final   Staphylococcus aureus NEGATIVE NEGATIVE Final    Comment:        The Xpert SA Assay (FDA approved for NASAL specimens in patients over 76 years of age), is one component of a comprehensive surveillance program.  Test performance  has been validated by [facility] for patients greater than or equal to 73 year old. It is not intended to diagnose infection nor to guide or monitor treatment.      Studies: No results found.  Scheduled Meds:  Scheduled Meds: . allopurinol  100 mg Oral Daily  . aspirin EC  81 mg Oral Daily  . atorvastatin  40 mg Oral q1800  . calcitRIOL  0.25 mcg Oral Daily  . [START ON 05/15/2015]  ceFAZolin (ANCEF) IV  2 g Intravenous to XRAY  . collagenase   Topical Daily  . [START ON 05/15/2015] darbepoetin (ARANESP) injection - DIALYSIS  100 mcg Intravenous Q Mon-HD  . enoxaparin (LOVENOX) injection  30 mg Subcutaneous QHS  . [START ON 05/15/2015] enoxaparin (LOVENOX) injection  30 mg Subcutaneous Q24H  . ezetimibe  10 mg Oral q morning - 10a  . feeding supplement (GLUCERNA SHAKE)  237 mL Oral BID PC  . ferric gluconate (FERRLECIT/NULECIT) IV  125 mg Intravenous Q M,W,F-HD  . gabapentin  300 mg Oral QHS  . insulin aspart  0-15 Units Subcutaneous TID WC  . insulin aspart  0-5 Units Subcutaneous QHS  . insulin aspart  5 Units Subcutaneous TID WC  . insulin detemir  16 Units Subcutaneous QHS  . isosorbide mononitrate  60 mg Oral QHS  . latanoprost  1 drop Both Eyes QHS  . mometasone-formoterol  2 puff Inhalation BID  . multivitamin with minerals  1 tablet Oral Daily  . pantoprazole  40 mg Oral BID  . ranolazine  500 mg Oral BID  . sertraline  100 mg Oral Daily  . sodium chloride  3 mL  Intravenous Q12H   Continuous Infusions:   Time spent on care of this patient: 25 min  Verneita Griffes, MD [facility] [phone]

## 2015-05-14 LAB — GLUCOSE, CAPILLARY
GLUCOSE-CAPILLARY: 474 mg/dL — AB (ref 65–99)
Glucose-Capillary: 432 mg/dL — ABNORMAL HIGH (ref 65–99)
Glucose-Capillary: 237 mg/dL — ABNORMAL HIGH (ref 65–99)
Glucose-Capillary: 136 mg/dL — ABNORMAL HIGH (ref 65–99)
Glucose-Capillary: 165 mg/dL — ABNORMAL HIGH (ref 65–99)

## 2015-05-14 MED ORDER — INSULIN DETEMIR 100 UNIT/ML ~~LOC~~ SOLN
20.0000 [IU] | Freq: Every day | SUBCUTANEOUS | Status: DC
  Administered 2015-05-14: 20 [IU] via SUBCUTANEOUS
  Filled 2015-05-14 (×2): qty 0.2

## 2015-05-14 NOTE — Progress Notes (Signed)
No change in d/c plan. Will d/c to SNF when medically stable per MD.  Please note that patient has [facility]- it is very doubtful that CIR or LTAC would be a considered options for patient.  She has been approved for SNF level of care and bed of choice per patient and family is in in place.Please advise re: tentative date of stability.   Lorie Phenix. Pauline Good, [place]

## 2015-05-14 NOTE — Progress Notes (Signed)
[facility] Progress Note   [name]  [id number]  DOB: [birth year]  DOA: 04/24/2015 PCP: Renato Shin, MD  Brief narrative: 81 y/o ?  chronic kidney disease stage III,  coronary artery disease-NSTEAMI 06/2014-recent cath 02/2015 neg PVD s/p L BKA 0000000 chronic systolic and diastolic heart failure EF 45-50% by [facility] 02/2015,  obstructive sleep apnea,/COPD>?   type 2 diabetes mellitus on insulin  Chr Stg 2 pressure ulcer R buttocks  presents to the hospital for shortness of breath and cough and is found to have pulmonary edema.   She was recently treated with Zithromax and increased dose of Lasix by her PCP however continued to be short of breath at rest. She was started on diuretics after admission however creatinine rose steadily without much results and diuresis.  Nephrology consulted and it was decided for her to undergo dialysis to help remove excess fluid.  Underwent creation L Brachio-ceph AV fistula 05/10/15 PErm Cath placement scheudled for 11/28 am   Subjective:  Sitting oob in chair no cp-managed to sit up for a couple of hour yesterday No fever Had BM   Assessment/Plan:    Acute respiratory failure with hypoxia -  Acute on chronic diastolic heart failure- AKI - significant rise in creatinine with diuresis - dialysis started on 11/15  - left arm brachio-ceph fistula done 05/10/15-L arm looks clean and Vascular feels patient stable for d/c  Active Problems: HCAP? - initially received Vancomycin and Ceftaz and then transitioned to Levaquin- completed 14 days  - of note the patient never had leukocytosis or fevers - SLP eval- D 3 diet with thin liquids -liberalized diet x 1 to allow for more calories-would back down to dys 3 diet if cough or other issues -added flutter valve on 11/24  -resolved  Junctional rhythm/ NSVT - junctional rhythm resolved with holding B-Blocker -currently in NSR on monitors - management per cardiology, they signed  off on 11/22  DM (diabetes mellitus), type 2, uncontrolled, with renal complications   + neuropathy - uses 70/30 insulin at home at a dose of 14 U only at breakfast -  Levemir 16 U ? to 20 U daily on 11.2 -continue  Novolog 5 U c Meals plus mealtime supplmental coverage in the hospital -sugars 237-474 -monitor for hypo -Continue Gabapentin 300 daily  Hyperlipidemia -cont atorvastatin 40 and Zetia 10 daily -2/2 prevention for lipid rich plaque and further PVD necessitates aggressive lipis control  Coronary artery disease due to lipid rich plaque; 70% ostial-proximal AV Groove Cx - not good PCI target -cont Imdur, Ranexa, statin and Aspirin  PVD s/p L BKA + Pressure ulcers -stump appears clean  Anemia of chronic disease - stable -continue Nulecit 125 c mwf dialysis   Code Status:     Code Status Orders        Start     Ordered   04/24/15 2353  Full code   Continuous     04/24/15 2354     Family Communication: d/w family bedside 05/13/15 Disposition Plan: SNF recommended-she needs dialysis CLIP as well as PC placement which will be 11/28 DVT prophylaxis: heparin Consultants:cardiology, nephrology Procedures:   Objective: Filed Weights   05/12/15 1210 05/13/15 0455 05/14/15 0501  Weight: 74.2 kg (163 lb 9.3 oz) 73.846 kg (162 lb 12.8 oz) 73.619 kg (162 lb 4.8 oz)    Intake/Output Summary (Last 24 hours) at 05/14/15 1137 Last data filed at 05/14/15 0859  Gross per 24 hour  Intake    230  ml  Output      0 ml  Net    230 ml     Vitals Filed Vitals:   05/13/15 2134 05/13/15 2143 05/14/15 0501 05/14/15 0855  BP:  106/56 135/54   Pulse: 59 75 97   Temp:  98.2 F (36.8 C) 98.2 F (36.8 C)   TempSrc:  Oral Oral   Resp: 18 18 18    Height:      Weight:   73.619 kg (162 lb 4.8 oz)   SpO2: 96% 95% 96% 95%    Exam:  General:  Pt is alert, not in acute distress  HEENT: No icterus, No thrush, oral mucosa moist, poor dentition  Cardiovascular: regular rate  and rhythm, S1/S2 No murmur  Respiratory: CTA b/l  Abdomen: Soft, +Bowel sounds, non tender, non distended, no guarding  MSK: No LE edema, cyanosis or clubbing  Skin fistula looks clean. Pressure ulcers  examined  On 11/25 showed one large ~ 5 cm stg 1 and a smaller stg2 with skin denudation  Data Reviewed: Basic Metabolic Panel:  Recent Labs Lab 05/07/15 1145 05/08/15 0541 05/09/15 0941 05/10/15 0422 05/12/15 0830  NA 137  --  136 137  136 135  K 3.6  --  3.7 3.8  3.8 4.2  CL 98*  --  98* 97*  97* 96*  CO2 30  --  29 28  28 31   GLUCOSE 258*  --  202* 96  95 205*  BUN 49*  --  57* 26*  26* 64*  CREATININE 3.20* 2.87* 3.60* 2.72*  2.69* 3.88*  CALCIUM 8.2*  --  8.5* 8.4*  8.3* 8.6*  PHOS 4.0  --  3.9 3.9 5.4*   Liver Function Tests:  Recent Labs Lab 05/07/15 1145 05/09/15 0941 05/10/15 0422 05/12/15 0830  ALBUMIN 2.6* 2.8* 3.0* 2.8*   No results for input(s): LIPASE, AMYLASE in the last 168 hours. No results for input(s): AMMONIA in the last 168 hours. CBC:  Recent Labs Lab 05/07/15 1145 05/09/15 0940 05/10/15 0422 05/12/15 0831  WBC 12.2* 13.1* 15.0* 10.7*  HGB 11.0* 11.0* 11.5* 9.9*  HCT 34.1* 35.9* 37.7 32.0*  MCV 87.9 84.5 83.6 84.7  PLT 279 263 241 221   Cardiac Enzymes: No results for input(s): CKTOTAL, CKMB, CKMBINDEX, TROPONINI in the last 168 hours. BNP (last 3 results)  Recent Labs  06/17/14 1330 06/17/14 1946 04/24/15 1738  BNP 1951.5* 1985.7* 1594.7*    ProBNP (last 3 results)  Recent Labs  06/08/14 1617  PROBNP 1249.0*    CBG:  Recent Labs Lab 05/13/15 1607 05/13/15 2141 05/14/15 0706 05/14/15 0708 05/14/15 1112  GLUCAP 247* 133* 432* 474* 237*    Recent Results (from the past 240 hour(s))  Surgical pcr screen     Status: None   Collection Time: 05/09/15  5:15 PM  Result Value Ref Range Status   MRSA, PCR NEGATIVE NEGATIVE Final   Staphylococcus aureus NEGATIVE NEGATIVE Final    Comment:        The  Xpert SA Assay (FDA approved for NASAL specimens in patients over 66 years of age), is one component of a comprehensive surveillance program.  Test performance has been validated by [facility] for patients greater than or equal to 52 year old. It is not intended to diagnose infection nor to guide or monitor treatment.      Studies: No results found.  Scheduled Meds:  Scheduled Meds: . allopurinol  100 mg Oral Daily  . aspirin  EC  81 mg Oral Daily  . atorvastatin  40 mg Oral q1800  . calcitRIOL  0.25 mcg Oral Daily  . [START ON 05/15/2015]  ceFAZolin (ANCEF) IV  2 g Intravenous to XRAY  . collagenase   Topical Daily  . [START ON 05/15/2015] darbepoetin (ARANESP) injection - DIALYSIS  100 mcg Intravenous Q Mon-HD  . [START ON 05/15/2015] enoxaparin (LOVENOX) injection  30 mg Subcutaneous Q24H  . ezetimibe  10 mg Oral q morning - 10a  . feeding supplement (GLUCERNA SHAKE)  237 mL Oral BID PC  . ferric gluconate (FERRLECIT/NULECIT) IV  125 mg Intravenous Q M,W,F-HD  . gabapentin  300 mg Oral QHS  . insulin aspart  0-15 Units Subcutaneous TID WC  . insulin aspart  0-5 Units Subcutaneous QHS  . insulin aspart  5 Units Subcutaneous TID WC  . insulin detemir  16 Units Subcutaneous QHS  . isosorbide mononitrate  60 mg Oral QHS  . latanoprost  1 drop Both Eyes QHS  . mometasone-formoterol  2 puff Inhalation BID  . multivitamin with minerals  1 tablet Oral Daily  . pantoprazole  40 mg Oral BID  . ranolazine  500 mg Oral BID  . sertraline  100 mg Oral Daily  . sodium chloride  3 mL Intravenous Q12H   Continuous Infusions:   Time spent on care of this patient: 15 min  Verneita Griffes, MD [facility] [phone]

## 2015-05-14 NOTE — Progress Notes (Signed)
Subjective: Interval History: has no complaint .  Objective: Vital signs in last 24 hours: Temp:  [98.2 F (36.8 C)-98.3 F (36.8 C)] 98.2 F (36.8 C) (11/27 0501) Pulse Rate:  [59-97] 97 (11/27 0501) Resp:  [18] 18 (11/27 0501) BP: (106-135)/(54-56) 135/54 mmHg (11/27 0501) SpO2:  [95 %-100 %] 95 % (11/27 0855) Weight:  [73.619 kg (162 lb 4.8 oz)] 73.619 kg (162 lb 4.8 oz) (11/27 0501) Weight change: -3.981 kg (-8 lb 12.4 oz)  Intake/Output from previous day: 11/26 0701 - 11/27 0700 In: 570 [P.O.:570] Out: 0  Intake/Output this shift: Total I/O In: 120 [P.O.:120] Out: -   General appearance: alert, cooperative and moderately obese Neck: RIJ cath Resp: diminished breath sounds bilaterally Cardio: S1, S2 normal and systolic murmur: holosystolic 2/6, blowing at apex GI: obese, pos bs,liver down 4 cm Extremities: LBKA, LUA avf  Lab Results:  Recent Labs  05/12/15 0831  WBC 10.7*  HGB 9.9*  HCT 32.0*  PLT 221   BMET:  Recent Labs  05/12/15 0830  NA 135  K 4.2  CL 96*  CO2 31  GLUCOSE 205*  BUN 64*  CREATININE 3.88*  CALCIUM 8.6*   No results for input(s): PTH in the last 72 hours. Iron Studies: No results for input(s): IRON, TIBC, TRANSFERRIN, FERRITIN in the last 72 hours.  Studies/Results: No results found.  I have reviewed the patient's current medications.  Assessment/Plan: 1 ESRD for HD,  Need converted to PC,  avf ok. Weak  Needs to be able to sit in chair 6h 2 Debill as above 3 anemia on esa 4 hPHT on meds 5 CAD 6 pneu resolved 7 PVD s/p BKA 8 COPD 9 DM controlled P HD, esa, mobilize , DM control, PC    LOS: 20 days   Hillis Mcphatter L 05/14/2015,9:56 AM

## 2015-05-15 ENCOUNTER — Inpatient Hospital Stay (HOSPITAL_COMMUNITY): Payer: Medicare Other

## 2015-05-15 ENCOUNTER — Encounter (HOSPITAL_COMMUNITY): Payer: Self-pay | Admitting: Vascular Surgery

## 2015-05-15 LAB — RENAL FUNCTION PANEL
ALBUMIN: 3 g/dL — AB (ref 3.5–5.0)
ANION GAP: 12 (ref 5–15)
BUN: 76 mg/dL — ABNORMAL HIGH (ref 6–20)
CO2: 25 mmol/L (ref 22–32)
Calcium: 9 mg/dL (ref 8.9–10.3)
Chloride: 99 mmol/L — ABNORMAL LOW (ref 101–111)
Creatinine, Ser: 3.96 mg/dL — ABNORMAL HIGH (ref 0.44–1.00)
GFR calc Af Amer: 11 mL/min — ABNORMAL LOW (ref >60)
GFR calc non Af Amer: 10 mL/min — ABNORMAL LOW (ref >60)
GLUCOSE: 171 mg/dL — AB (ref 65–99)
PHOSPHORUS: 5.3 mg/dL — AB (ref 2.5–4.6)
POTASSIUM: 4.2 mmol/L (ref 3.5–5.1)
Sodium: 136 mmol/L (ref 135–145)

## 2015-05-15 LAB — CBC WITH DIFFERENTIAL/PLATELET
BASOS ABS: 0.1 10*3/uL (ref 0.0–0.1)
Basophils Relative: 1 %
Eosinophils Absolute: 0.4 10*3/uL (ref 0.0–0.7)
Eosinophils Relative: 4 %
HEMATOCRIT: 33.9 % — AB (ref 36.0–46.0)
HEMOGLOBIN: 10.8 g/dL — AB (ref 12.0–15.0)
LYMPHS PCT: 14 %
Lymphs Abs: 1.7 10*3/uL (ref 0.7–4.0)
MCH: 26.6 pg (ref 26.0–34.0)
MCHC: 31.9 g/dL (ref 30.0–36.0)
MCV: 83.5 fL (ref 78.0–100.0)
MONO ABS: 0.5 10*3/uL (ref 0.1–1.0)
Monocytes Relative: 4 %
NEUTROS ABS: 9.4 10*3/uL — AB (ref 1.7–7.7)
NEUTROS PCT: 77 %
Platelets: 252 10*3/uL (ref 150–400)
RBC: 4.06 MIL/uL (ref 3.87–5.11)
RDW: 20.4 % — ABNORMAL HIGH (ref 11.5–15.5)
WBC: 12 10*3/uL — AB (ref 4.0–10.5)

## 2015-05-15 LAB — CREATININE, SERUM
Creatinine, Ser: 4.24 mg/dL — ABNORMAL HIGH (ref 0.44–1.00)
GFR calc Af Amer: 10 mL/min — ABNORMAL LOW (ref >60)
GFR, EST NON AFRICAN AMERICAN: 9 mL/min — AB (ref >60)

## 2015-05-15 LAB — GLUCOSE, CAPILLARY
GLUCOSE-CAPILLARY: 135 mg/dL — AB (ref 65–99)
GLUCOSE-CAPILLARY: 166 mg/dL — AB (ref 65–99)
Glucose-Capillary: 224 mg/dL — ABNORMAL HIGH (ref 65–99)

## 2015-05-15 MED ORDER — LIDOCAINE HCL 1 % IJ SOLN
INTRAMUSCULAR | Status: AC
  Filled 2015-05-15: qty 20

## 2015-05-15 MED ORDER — HEPARIN SODIUM (PORCINE) 1000 UNIT/ML DIALYSIS: 100.0000 [IU]/kg | INTRAMUSCULAR | Status: DC | PRN

## 2015-05-15 MED ORDER — LIDOCAINE HCL (PF) 1 % IJ SOLN: 5.0000 mL | INTRAMUSCULAR | Status: DC | PRN

## 2015-05-15 MED ORDER — HEPARIN SODIUM (PORCINE) 1000 UNIT/ML IJ SOLN
INTRAMUSCULAR | Status: AC
  Filled 2015-05-15: qty 1

## 2015-05-15 MED ORDER — FENTANYL CITRATE (PF) 100 MCG/2ML IJ SOLN
INTRAMUSCULAR | Status: AC | PRN
  Administered 2015-05-15 (×2): 25 ug via INTRAVENOUS

## 2015-05-15 MED ORDER — CHLORHEXIDINE GLUCONATE 4 % EX LIQD
CUTANEOUS | Status: AC
  Filled 2015-05-15: qty 15

## 2015-05-15 MED ORDER — SODIUM CHLORIDE 0.9 % IV SOLN: 100.0000 mL | INTRAVENOUS | Status: DC | PRN

## 2015-05-15 MED ORDER — MIDAZOLAM HCL 2 MG/2ML IJ SOLN
INTRAMUSCULAR | Status: AC
  Filled 2015-05-15: qty 2

## 2015-05-15 MED ORDER — PENTAFLUOROPROP-TETRAFLUOROETH EX AERO: 1.0000 "application " | INHALATION_SPRAY | CUTANEOUS | Status: DC | PRN

## 2015-05-15 MED ORDER — HEPARIN SODIUM (PORCINE) 1000 UNIT/ML DIALYSIS: 1000.0000 [IU] | INTRAMUSCULAR | Status: DC | PRN

## 2015-05-15 MED ORDER — FENTANYL CITRATE (PF) 100 MCG/2ML IJ SOLN
INTRAMUSCULAR | Status: AC
  Filled 2015-05-15: qty 2

## 2015-05-15 MED ORDER — SODIUM CHLORIDE 0.9 % IV SOLN
INTRAVENOUS | Status: AC | PRN
  Administered 2015-05-15: 10 mL/h via INTRAVENOUS

## 2015-05-15 MED ORDER — ALTEPLASE 2 MG IJ SOLR: 2.0000 mg | Freq: Once | INTRAMUSCULAR | Status: DC | PRN

## 2015-05-15 MED ORDER — MIDAZOLAM HCL 2 MG/2ML IJ SOLN
INTRAMUSCULAR | Status: AC | PRN
  Administered 2015-05-15 (×2): 0.5 mg via INTRAVENOUS

## 2015-05-15 MED ORDER — LIDOCAINE-PRILOCAINE 2.5-2.5 % EX CREA: 1.0000 "application " | TOPICAL_CREAM | CUTANEOUS | Status: DC | PRN

## 2015-05-15 MED ORDER — CEFAZOLIN SODIUM-DEXTROSE 2-3 GM-% IV SOLR
INTRAVENOUS | Status: AC
  Filled 2015-05-15: qty 50

## 2015-05-15 MED ORDER — SORBITOL 70 % SOLN: 20.0000 mL | Freq: Every day | Status: DC | PRN

## 2015-05-15 NOTE — Clinical Social Work Placement (Addendum)
   CLINICAL SOCIAL WORK PLACEMENT  NOTE  Date:  05/15/2015  Patient Details  Name: [name] MRN: [id number] Date of Birth: 1929-07-17  Clinical Social Work is seeking post-discharge placement for this patient at the [place] level of care (*CSW will initial, date and re-position this form in  chart as items are completed):  Yes   Patient/family provided with [facility] Work Department's list of facilities offering this level of care within the geographic area requested by the patient (or if unable, by the patient's family).  Yes   Patient/family informed of their freedom to choose among providers that offer the needed level of care, that participate in Medicare, Medicaid or managed care program needed by the patient, have an available bed and are willing to accept the patient.  Yes   Patient/family informed of [facility]'s ownership interest in [facility] and [facility], as well as of the fact that they are under no obligation to receive care at these facilities.  PASRR submitted to EDS on 05/08/15     PASRR number received on 05/08/15     Existing PASRR number confirmed on       FL2 transmitted to all facilities in geographic area requested by pt/family on 05/10/15     FL2 transmitted to all facilities within larger geographic area on       Patient informed that his/her managed care company has contracts with or will negotiate with certain facilities, including the following:   [facility]che- Medicare-- authorization number  [id number])         Patient/family informed of bed offers received.  Patient chooses bed at [facility] but they pulled their bed offer today)     Physician recommends and patient chooses bed at      Patient to be transferred to [facility] on 05/15/15.  Patient to be transferred to facility by Ambulance [facility])     Patient family notified on  05/15/15 of transfer.  Name of family member notified:  Husband:  Rudie Javier     PHYSICIAN Please prepare priority discharge summary, including medications, Please sign FL2, Please prepare prescriptions     Additional Comment:  Ok per MD for d/c today to SNF. Patient and husband planned for d/c to [facility] but as of today they were unable to accept patient.  Bed offer received from [facility] and they accepted a bed there.  DC summary sent to facility; nursing notified to call report. No further CSW needs identified. Husband verbalized some feelings of trepidation over move to this SNF as they were "mentally prepared" for [facility] and are not familiar with [facility].  Patient seems to have a very positive attitude and states she will work hard to complete her PT to be able to return home as soon as possible.  CSW signing off.  Lorie Phenix. Murrell Redden [id number]    Notified [facility] Admissions- patient will receive dialysis at the [facility] facility T, Th, Sat - will need to be at first appointment tomorrow at 10:30 am.  Patient's husband also instructed to bring patient's CPAP from home for her use at the facility.  He indicated that he would do this today.    _______________________________________________ Williemae Area, LCSW 05/15/2015, 3:22 PM

## 2015-05-15 NOTE — NC FL2 (Signed)
[place] MEDICAID FL2 LEVEL OF CARE SCREENING TOOL     IDENTIFICATION  Patient Name: [name] Birthdate: [birth year] Sex: female Admission Date (Current Location): 04/24/2015  [facility] and [place] Number: Herbalist and Address:  [facility], [place] [address] [place], [place] [address]      Provider Number:    Attending Physician Name and Address:  Nita Sells, MD  Relative Name and Phone Number:       Current Level of Care: SNF Recommended Level of Care: [place] Prior Approval Number:    Date Approved/Denied:   PASRR Number:  [id number]  Discharge Plan: SNF    Current Diagnoses: Patient Active Problem List   Diagnosis Date Noted  . Acute kidney injury superimposed on chronic kidney disease ([place])   . AKI (acute kidney injury) ([place])   . NSVT (nonsustained ventricular tachycardia) ([place]) 04/26/2015  . Acute on chronic congestive heart failure ([place])   . CHF exacerbation ([place]) 04/24/2015  . Acute on chronic diastolic heart failure ([place]) 04/24/2015  . Acute respiratory failure with hypoxia ([place]) 04/24/2015  . HCAP (healthcare-associated pneumonia) 04/24/2015  . Chronic combined systolic and diastolic CHF (congestive heart failure) ([place])   . Elevated troponin 03/06/2015  . Dizziness 10/25/2014  . Wellness examination 08/18/2014  . Caries 08/01/2014  . Xerostomia 08/01/2014  . Hyperkalemia 06/22/2014  . Sinus bradycardia 06/19/2014  . Contrast media allergy 06/19/2014  . NSTEMI (non-ST elevated myocardial infarction) ([place]) 06/17/2014  . Secondary DM with peripheral vascular disease ([place]) 12/13/2013  . Acute on chronic kidney failure ([place]) 12/13/2013  . Gout 12/09/2013  . Avulsion injury of left iliopsoas and hematoma 12/06/2013  . Left hip pain 12/05/2013  . Left leg pain 12/05/2013  . Hyperuricemia 07/12/2013  . UTI (urinary tract infection) 02/19/2013  . Lower limb amputation, below knee  09/10/2012  . Hypertension associated with diabetes ([place]) 09/10/2012  . Gait instability: Status post left BKA 09/10/2012  . Acute kidney injury ([place]) 09/04/2012  . Ischemic foot 08/30/2012  . Vitamin D deficiency 02/20/2012  . Encounter for long-term (current) use of other medications 11/21/2011  . Hematuria 01/03/2011  . POLYURIA 04/02/2010  . HYPOKALEMIA 01/22/2010  . PROTEINURIA 05/30/2009  . CAROTID BRUIT, LEFT 05/18/2009  . DYSPHAGIA 04/05/2009  . Obstructive sleep apnea 10/12/2008  . PERS HX NONCOMPLIANCE W/MED TX PRS HAZARDS HLTH 09/01/2008  . Acute renal failure superimposed on stage 3 chronic kidney disease ([place]) 07/19/2008  . Osteoporosis 06/17/2008  . EDEMA 12/21/2007  . Hyperlipidemia 07/16/2007  . DEPRESSION 07/16/2007  . PERIPHERAL NEUROPATHY 07/16/2007  . ASTHMA 07/16/2007  . COPD 07/16/2007  . PEPTIC ULCER DISEASE 07/16/2007  . DM (diabetes mellitus), type 2, uncontrolled, with renal complications ([place]) [id number]  . Essential hypertension 01/22/2007  . Coronary artery disease due to lipid rich plaque; 70% ostial-proximal AV Groove Cx - not good PCI target 01/22/2007  . PERIPHERAL VASCULAR DISEASE 01/22/2007  . GERD 01/22/2007  . OSTEOARTHRITIS 01/22/2007    Orientation ACTIVITIES/SOCIAL BLADDER RESPIRATION    Self, Time, Situation, Place  Passive Continent Normal  BEHAVIORAL SYMPTOMS/MOOD NEUROLOGICAL BOWEL NUTRITION STATUS  Other (Comment) (appropriate)   Continent  (heart)  PHYSICIAN VISITS COMMUNICATION OF NEEDS Height & Weight Skin    Verbally   179 lbs. PU Stage and Appropriate Care          AMBULATORY STATUS RESPIRATION    Assist extensive Normal      Personal Care Assistance Level of Assistance  Bathing, Dressing Bathing Assistance: Limited assistance   Dressing Assistance: Limited assistance      Functional Limitations Info                SPECIAL CARE FACTORS FREQUENCY                      Additional Factors Info   Code Status Code Status Info: full             Current Medications (05/15/2015):  This is the current hospital active medication list Current Facility-Administered Medications  Medication Dose Route Frequency Provider Last Rate Last Dose  . 0.9 %  sodium chloride infusion  100 mL Intravenous PRN Mauricia Area, MD      . 0.9 %  sodium chloride infusion  100 mL Intravenous PRN Mauricia Area, MD      . acetaminophen (TYLENOL) tablet 650 mg  650 mg Oral Q6H PRN Rise Patience, MD       Or  . acetaminophen (TYLENOL) suppository 650 mg  650 mg Rectal Q6H PRN Rise Patience, MD      . albuterol (PROVENTIL) (2.5 MG/3ML) 0.083% nebulizer solution 2.5 mg  2.5 mg Nebulization Q2H PRN Rise Patience, MD      . allopurinol (ZYLOPRIM) tablet 100 mg  100 mg Oral Daily Rise Patience, MD   100 mg at 05/15/15 1028  . alteplase (CATHFLO ACTIVASE) injection 2 mg  2 mg Intracatheter Once PRN Mauricia Area, MD      . antiseptic oral rinse (BIOTENE) solution 15 mL  15 mL Mouth Rinse PRN Debbe Odea, MD      . aspirin EC tablet 81 mg  81 mg Oral Daily Rise Patience, MD   81 mg at 05/15/15 1028  . atorvastatin (LIPITOR) tablet 40 mg  40 mg Oral q1800 Rise Patience, MD   40 mg at 05/14/15 1715  . bisacodyl (DULCOLAX) EC tablet 5 mg  5 mg Oral Daily PRN Nita Sells, MD   5 mg at 05/14/15 2357  . calcitRIOL (ROCALTROL) capsule 0.25 mcg  0.25 mcg Oral Daily Mauricia Area, MD   0.25 mcg at 05/15/15 1028  . ceFAZolin (ANCEF) 2-3 GM-% IVPB SOLR           . chlorhexidine (HIBICLENS) 4 % liquid           . collagenase (SANTYL) ointment   Topical Daily Nita Sells, MD      . Darbepoetin Alfa (ARANESP) injection 100 mcg  100 mcg Intravenous Q Mon-HD Mauricia Area, MD      . enoxaparin (LOVENOX) injection 30 mg  30 mg Subcutaneous Q24H Monia Sabal, PA-C   30 mg at 05/14/15 2346  . ezetimibe (ZETIA) tablet 10 mg  10 mg Oral q morning - 10a Nita Sells, MD    10 mg at 05/15/15 1028  . feeding supplement (GLUCERNA SHAKE) (GLUCERNA SHAKE) liquid 237 mL  237 mL Oral BID PC Reanne J Barbato, RD   237 mL at 05/15/15 0900  . fentaNYL (SUBLIMAZE) 100 MCG/2ML injection           . ferric gluconate (NULECIT) 125 mg in sodium chloride 0.9 % 100 mL IVPB  125 mg Intravenous Q M,W,F-HD Mauricia Area, MD   125 mg at 05/12/15 0957  . gabapentin (NEURONTIN) capsule 300 mg  300 mg Oral QHS Rise Patience, MD   300 mg at 05/14/15 2347  . heparin 1000 UNIT/ML injection           .  heparin 1000 UNIT/ML injection           . heparin injection 1,000 Units  1,000 Units Dialysis PRN Mauricia Area, MD      . heparin injection 7,400 Units  100 Units/kg Dialysis PRN Mauricia Area, MD      . HYDROcodone-acetaminophen [facility]) 10-325 MG per tablet 1 tablet  1 tablet Oral Q6H PRN Rise Patience, MD   1 tablet at 05/06/15 2232  . insulin aspart (novoLOG) injection 0-15 Units  0-15 Units Subcutaneous TID WC Charlynne Cousins, MD   5 Units at 05/15/15 0600  . insulin aspart (novoLOG) injection 0-5 Units  0-5 Units Subcutaneous QHS Charlynne Cousins, MD   3 Units at 05/07/15 2142  . insulin aspart (novoLOG) injection 5 Units  5 Units Subcutaneous TID WC Debbe Odea, MD   5 Units at 05/15/15 1258  . insulin detemir (LEVEMIR) injection 20 Units  20 Units Subcutaneous QHS Nita Sells, MD   20 Units at 05/14/15 2348  . isosorbide mononitrate (IMDUR) 24 hr tablet 60 mg  60 mg Oral QHS Mauricia Area, MD   60 mg at 05/14/15 2347  . latanoprost (XALATAN) 0.005 % ophthalmic solution 1 drop  1 drop Both Eyes QHS Rise Patience, MD   1 drop at 05/14/15 2349  . lidocaine (PF) (XYLOCAINE) 1 % injection 5 mL  5 mL Intradermal PRN Mauricia Area, MD      . lidocaine (XYLOCAINE) 1 % (with pres) injection           . lidocaine-prilocaine (EMLA) cream 1 application  1 application Topical PRN Mauricia Area, MD      . menthol-cetylpyridinium (CEPACOL) lozenge 3 mg   1 lozenge Oral PRN Rhetta Mura Schorr, NP   3 mg at 04/26/15 2239  . midazolam (VERSED) 2 MG/2ML injection           . mometasone-formoterol (DULERA) 100-5 MCG/ACT inhaler 2 puff  2 puff Inhalation BID Rise Patience, MD   2 puff at 05/15/15 0825  . multivitamin with minerals tablet 1 tablet  1 tablet Oral Daily Lorenda Peck, RD   1 tablet at 05/15/15 1028  . nitroGLYCERIN (NITROSTAT) SL tablet 0.4 mg  0.4 mg Sublingual Q5 min PRN Rise Patience, MD      . ondansetron [facility]) tablet 4 mg  4 mg Oral Q6H PRN Rise Patience, MD       Or  . ondansetron [facility]) injection 4 mg  4 mg Intravenous Q6H PRN Rise Patience, MD      . pantoprazole (PROTONIX) EC tablet 40 mg  40 mg Oral BID Rise Patience, MD   40 mg at 05/15/15 1028  . pentafluoroprop-tetrafluoroeth (GEBAUERS) aerosol 1 application  1 application Topical PRN Mauricia Area, MD      . ranolazine (RANEXA) 12 hr tablet 500 mg  500 mg Oral BID Rise Patience, MD   500 mg at 05/15/15 1028  . sertraline (ZOLOFT) tablet 100 mg  100 mg Oral Daily Rise Patience, MD   100 mg at 05/15/15 1028  . sodium chloride 0.9 % injection 3 mL  3 mL Intravenous Q12H Rise Patience, MD   3 mL at 05/15/15 1000  . sorbitol 70 % solution 20 mL  20 mL Oral Daily PRN Nita Sells, MD         Discharge Medications: Please see discharge summary for a list of discharge medications.  Relevant Imaging Results:  Relevant Lab  Results:  Recent Labs    Additional Information lohexol  Williemae Area, [place]

## 2015-05-15 NOTE — Discharge Summary (Addendum)
Physician Discharge Summary  [name] MHD:[id number] DOB: 1929-07-11 DOA: 04/24/2015  PCP: Renato Shin, MD  Admit date: 04/24/2015 Discharge date: 05/15/2015  Time spent: 35 minutes  Recommendations for Outpatient Follow-up:  1. patient will need volume management by dialysis 2. discontinued the following medications this admission Lasix 40, gabapentin adjusted from 300-->100 daily at bedtime, BiDil changed to hydralazine 25 3 times a day 3. prescription given for gabapentin 100 daily at bedtime and hydrocodone 10/325 one every 6 when necessary pain 30 tablets 4. Resume NPH insulin 7030 , 20 units into skin daily per sliding scale and this may need to be adjusted as an outpatient 5. patient will be discharging to skilled nursing facility as an outpatient and will be dialyzing at [facility] please ensure that patient is on that schedule as she is a new start for dialysis 6. patient should have her stage I and stage II pressure ulcers documented as an outpatient  Discharge Diagnoses:  Principal Problem:   Acute respiratory failure with hypoxia ([place]) Active Problems:   DM (diabetes mellitus), type 2, uncontrolled, with renal complications ([place])   Hyperlipidemia   Essential hypertension   Coronary artery disease due to lipid rich plaque; 70% ostial-proximal AV Groove Cx - not good PCI target   Acute on chronic diastolic heart failure (HCC)   HCAP (healthcare-associated pneumonia)   NSVT (nonsustained ventricular tachycardia) (HCC)   Acute on chronic congestive heart failure (HCC)   AKI (acute kidney injury) ([place])   Acute kidney injury superimposed on chronic kidney disease ([place])   Discharge Condition: stable F2 renal heart healthy  Diet recommendation: renal fluid restricted low-salt diet-needs dysphagia 3 diet  Filed Weights   05/13/15 0455 05/14/15 0501 05/15/15 0503  Weight: 73.846 kg (162 lb 12.8 oz) 73.619 kg (162 lb 4.8 oz) 75.6 kg (166 lb 10.7 oz)     History of present illness:  81 y/o ? chronic kidney disease stage III,  coronary artery disease-NSTEAMI 06/2014-recent cath 02/2015 neg PVD s/p L BKA 07/1192 chronic systolic and diastolic heart failure EF 45-50% by [facility] 02/2015,  obstructive sleep apnea,/COPD>?  type 2 diabetes mellitus on insulin  Chr Stg 2 pressure ulcer R buttocks  presents to the hospital for shortness of breath and cough and is found to have pulmonary edema.   She was recently treated with Zithromax and increased dose of Lasix by her PCP however continued to be short of breath at rest. She was started on diuretics after admission however creatinine rose steadily without much results and diuresis. Nephrology consult was requested and it was decided for her to undergo dialysis to help remove excess fluid.  Underwent creation L Brachio-ceph AV fistula 05/10/15  Hospital Course:  Acute respiratory failure with hypoxia - Acute on chronic diastolic heart failure- AKI - significant rise in creatinine with diuresis - dialysis started on 11/15  - left arm brachio-ceph fistula done 05/10/15.  patient stabilized from that standpoint -Right IJ tunneled HD catheter placed 11/28 Wound on L arm looks clean and Vascular feels patient stable for d/c  Active Problems: HCAP? - had a cough with congestion - CXR obtained after fluid has been dialyzed off does not reveal infiltrates consistent with pneumonia - initially received Vancomycin and Ceftaz and then transitioned to Levaquin- cough unfortunately not improving with antibiotics- completed 14 days  - of note the patient never had leukocytosis or fevers - SLP eval- D 3 diet with thin liquids -added flutter valve on 11/24  -Resolved  Junctional  rhythm/ NSVT - junctional rhythm resolved with holding B-Blocker -currently in NSR on monitors - managementwas initially better per cardiology, they signed off on 11/22  DM (diabetes mellitus), type 2, uncontrolled, with  renal complications + neuropathy - uses 70/30 insulin at home at a dose of 14 U only at breakfast- she was hypoglycemic on 11/17- Levemir dosage decreased- sugars improved -sugars 135-293 -Levemir was used during hospital stay On discharge was placed on NPH at 20 units -transitioned dose of Gabapentin 300 daily-->100 daily at bedtime  Hyperlipidemia -cont atorvastatin 40 and Zetia 10 daily -2/2 prevention for lipid rich plaque and further PVD necessitates aggressive lipis control  Coronary artery disease due to lipid rich plaque; 70% ostial-proximal AV Groove Cx - not good PCI target -cont Imdur, Ranexa, statin and Aspirin  PVD s/p L BKA + Pressure ulcers -stump appears clean -has a larger stage 1 ulcer ~ 5 cm circum and a smaller 2 cm circumferential stg2 ulcer -these will need to be monitored as an outpatient  Anemia of chronic disease - stable -continue Nulecit 125 c mwf dialysis   Consultations:  cardiology  Nephrology  Discharge Exam: Filed Vitals:   05/15/15 1014 05/15/15 1020  BP: 156/70 152/66  Pulse: 72 72  Temp:    Resp: 15 16    General: EOMI NCAT Cardiovascular: S1-S2 no murmur rub or gallop Respiratory: clinically clear Skin on bottom was clean but did have stage I and stage II decubiti which will need follow-up at nursing facility  Discharge Instructions   Discharge Instructions    Diet - low sodium heart healthy    Complete by:  As directed      Increase activity slowly    Complete by:  As directed           Current Discharge Medication List    CONTINUE these medications which have CHANGED   Details  gabapentin (NEURONTIN) 100 MG capsule Take 1 capsule (100 mg total) by mouth at bedtime. Qty: 30 capsule, Refills: 0    HYDROcodone-acetaminophen (NORCO) 10-325 MG tablet Take 1 tablet by mouth every 6 (six) hours as needed for moderate pain. Qty: 30 tablet, Refills: 0      CONTINUE these medications which have NOT CHANGED   Details   acetaminophen (TYLENOL) 500 MG tablet Take 500 mg by mouth every 6 (six) hours as needed for mild pain.    albuterol (PROVENTIL HFA;VENTOLIN HFA) 108 (90 BASE) MCG/ACT inhaler Inhale 2 puffs into the lungs every 4 (four) hours as needed for wheezing or shortness of breath. Qty: 1 Inhaler, Refills: 3    albuterol (PROVENTIL) (2.5 MG/3ML) 0.083% nebulizer solution Take 3 mLs (2.5 mg total) by nebulization every 4 (four) hours as needed for wheezing or shortness of breath. Qty: 75 mL, Refills: 12    aspirin EC 81 MG tablet Take 1 tablet (81 mg total) by mouth daily. Qty: 90 tablet, Refills: 3    calcitRIOL (ROCALTROL) 0.25 MCG capsule TAKE ONE CAPSULE BY MOUTH EVERY DAY Qty: 30 capsule, Refills: 0    clopidogrel (PLAVIX) 75 MG tablet Take 75 mg by mouth daily.    donepezil (ARICEPT) 5 MG tablet Take 1 tablet (5 mg total) by mouth at bedtime. Qty: 30 tablet, Refills: 11    feeding supplement (GLUCERNA SHAKE) LIQD Take 237 mLs by mouth every morning.     Fluticasone-Salmeterol (ADVAIR DISKUS) 100-50 MCG/DOSE AEPB Inhale 1 puff into the lungs 2 (two) times daily. Qty: 1 each, Refills: 11  insulin NPH-regular Human (NOVOLIN 70/30) (70-30) 100 UNIT/ML injection Inject 14 Units into the skin daily with breakfast. Sliding scale. Only takes in the morning Qty: 10 mL, Refills: 11    isosorbide mononitrate (IMDUR) 60 MG 24 hr tablet Take 60 mg by mouth daily. Refills: 5    latanoprost (XALATAN) 0.005 % ophthalmic solution Place 1 drop into both eyes at bedtime.    nitroGLYCERIN (NITROSTAT) 0.4 MG SL tablet Place 0.4 mg under the tongue every 5 (five) minutes as needed for chest pain.     ranolazine (RANEXA) 500 MG 12 hr tablet Take 1 tablet (500 mg total) by mouth 2 (two) times daily. Qty: 60 tablet, Refills: 3    ZETIA 10 MG tablet TAKE 1 TABLET BY MOUTH EVERY MORNING Qty: 30 tablet, Refills: 0    allopurinol (ZYLOPRIM) 100 MG tablet TAKE 1 TABLET BY MOUTH EVERY DAY Qty: 30 tablet,  Refills: 0    amLODipine (NORVASC) 10 MG tablet TAKE 1 TABLET BY MOUTH DAILY Qty: 30 tablet, Refills: 0    atorvastatin (LIPITOR) 40 MG tablet TAKE 1 TABLET BY MOUTH DAILY AT 6 PM Qty: 30 tablet, Refills: 0    hydrALAZINE (APRESOLINE) 25 MG tablet TAKE 1 TABLET BY MOUTH THREE TIMES DAILY Qty: 90 tablet, Refills: 0    ONE TOUCH ULTRA TEST test strip USE TO TEST BLOOD SUGAR TWICE DAILY. Qty: 100 each, Refills: 3    pantoprazole (PROTONIX) 40 MG tablet TAKE 1 TABLET BY MOUTH TWICE DAILY Qty: 60 tablet, Refills: 0    sertraline (ZOLOFT) 100 MG tablet TAKE 1 TABLET BY MOUTH EVERY DAY Qty: 30 tablet, Refills: 0      STOP taking these medications     furosemide (LASIX) 40 MG tablet      potassium chloride SA (K-DUR,KLOR-CON) 10 MEQ tablet      azithromycin (ZITHROMAX) 500 MG tablet      isosorbide-hydrALAZINE (BIDIL) 20-37.5 MG tablet        Allergies  Allergen Reactions  . Iohexol Itching and Rash     Code: RASH, Desc: [place] ON PT'S CHART ALLERGIC TO IV DYE 09/04/07/RM, Onset Date: [id number]    Follow-up Information    Follow up with Renato Shin, MD.   Specialty:  Endocrinology   Contact information:   [address] [place] [address] [phone]       Follow up with [facility].   Why:  They will provide your home health care services at your home   Contact information:   [place] [place] [address] [phone]       Follow up with Ruta Hinds, MD In 6 weeks.   Specialties:  Vascular Surgery, Cardiology   Why:  Office will call you to arrange your appt (sent)   Contact information:   [place] [address] [phone]        The results of significant diagnostics from this hospitalization (including imaging, microbiology, ancillary and laboratory) are listed below for reference.    Significant Diagnostic Studies: Dg Chest 2 View  04/26/2015  CLINICAL DATA:  Shortness of breath.  EXAM: CHEST  2 VIEW COMPARISON:  04/24/2015. FINDINGS: Cardiomegaly with pulmonary vascular prominence and mild interstitial prominence, improved from prior exam. Findings consistent with improving congestive heart failure. Cardiomegaly remains severe. Underlying process such as pericardial effusion, cardiomyopathy, or cardiac valve disease cannot be excluded . No pleural effusion or pneumothorax . IMPRESSION: 1. Improving congestive heart failure. 2. Persistent  severe cardiomegaly. Underlying process such as pericardial fusion, cardiomyopathy, cardiac valve disease cannot be excluded . Electronically Signed   By: Marcello Moores  Register   On: 04/26/2015 07:47   Dg Chest 2 View  04/24/2015  CLINICAL DATA:  Five day history of upper urinary tract infection. No improvement after antibiotics. Productive cough. EXAM: CHEST  2 VIEW COMPARISON:  04/19/2015 FINDINGS: The heart is enlarged but appears relatively stable. There is tortuosity and calcification of the thoracic aorta. Diffuse bilateral airspace process could reflect pulmonary edema or bilateral infiltrates. No definite pleural effusions. The bony thorax is intact. IMPRESSION: Cardiac enlargement. Diffuse airspace process could reflect pulmonary edema or bilateral infiltrates. No definite pleural effusions. Electronically Signed   By: Marijo Sanes M.D.   On: 04/24/2015 17:16   Dg Chest 2 View  04/19/2015  CLINICAL DATA:  Cough for 1 week.  Wheezing for 2 days.  Congestion. EXAM: CHEST  2 VIEW COMPARISON:  03/06/2015 FINDINGS: There is cardiomegaly with vascular congestion and mild perihilar opacities concerning for early edema. No effusions. No acute bony abnormality. IMPRESSION: Findings concerning for mild CHF. Electronically Signed   By: Rolm Baptise M.D.   On: 04/19/2015 12:42   US Renal  05/01/2015  CLINICAL DATA:  Renal failure. EXAM: RENAL / URINARY TRACT ULTRASOUND COMPLETE COMPARISON:  CT, 12/05/2013 FINDINGS: Right Kidney: Length: 10.6 cm.  Echogenicity within normal limits. No mass or hydronephrosis visualized. Left Kidney: Length: 10.3 cm. Echogenicity within normal limits. No mass or hydronephrosis visualized. Bladder: Decompressed with a Foley catheter IMPRESSION: Normal renal ultrasound. Electronically Signed   By: Lajean Manes M.D.   On: 05/01/2015 12:46   Ir Fluoro Guide Cv Line Right  05/02/2015  INDICATION: Acute on chronic renal insufficiency. Please place temporary dialysis catheter for the initiation of dialysis. EXAM: NON-TUNNELED CENTRAL VENOUS HEMODIALYSIS CATHETER PLACEMENT WITH ULTRASOUND AND FLUOROSCOPIC GUIDANCE COMPARISON:  None. MEDICATIONS: None CONTRAST:  None FLUOROSCOPY TIME:  24 seconds (40.9 mGy) COMPLICATIONS: None immediate PROCEDURE: Informed written consent was obtained from the patient after a discussion of the risks, benefits, and alternatives to treatment. Questions regarding the procedure were encouraged and answered. The right neck and chest were prepped with chlorhexidine in a sterile fashion, and a sterile drape was applied covering the operative field. Maximum barrier sterile technique with sterile gowns and gloves were used for the procedure. A timeout was performed prior to the initiation of the procedure. After creating a small venotomy incision, a micropuncture kit was utilized to access the right internal jugular vein under direct, real-time ultrasound guidance after the overlying soft tissues were anesthetized with 1% lidocaine with epinephrine. Ultrasound image documentation was performed. The microwire was kinked to measure appropriate catheter length. A stiff glidewire was advanced to the level of the IVC. Under fluoroscopic guidance, the venotomy was serially dilated, ultimately allowing placement of a 20 cm temporary Trialysis catheter with tip ultimately terminating within the superior aspect of the right atrium. Final catheter positioning was confirmed and documented with a spot radiographic  image. The catheter aspirates and flushes normally. The catheter was flushed with appropriate volume heparin dwells. The catheter exit site was secured with a 0-Prolene retention suture. A dressing was placed. The patient tolerated the procedure well without immediate post procedural complication. IMPRESSION: Successful placement of a right internal jugular approach 20 cm temporary dialysis catheter with tip terminating with in the superior aspect of the right atrium. This catheter may be converted to a tunneled dialysis catheter at a later date as indicated.  The catheter is ready for immediate use. Electronically Signed   By: Sandi Mariscal M.D.   On: 05/02/2015 09:53   Ir US Guide Vasc Access Right  05/02/2015  INDICATION: Acute on chronic renal insufficiency. Please place temporary dialysis catheter for the initiation of dialysis. EXAM: NON-TUNNELED CENTRAL VENOUS HEMODIALYSIS CATHETER PLACEMENT WITH ULTRASOUND AND FLUOROSCOPIC GUIDANCE COMPARISON:  None. MEDICATIONS: None CONTRAST:  None FLUOROSCOPY TIME:  24 seconds (29.5 mGy) COMPLICATIONS: None immediate PROCEDURE: Informed written consent was obtained from the patient after a discussion of the risks, benefits, and alternatives to treatment. Questions regarding the procedure were encouraged and answered. The right neck and chest were prepped with chlorhexidine in a sterile fashion, and a sterile drape was applied covering the operative field. Maximum barrier sterile technique with sterile gowns and gloves were used for the procedure. A timeout was performed prior to the initiation of the procedure. After creating a small venotomy incision, a micropuncture kit was utilized to access the right internal jugular vein under direct, real-time ultrasound guidance after the overlying soft tissues were anesthetized with 1% lidocaine with epinephrine. Ultrasound image documentation was performed. The microwire was kinked to measure appropriate catheter length. A stiff  glidewire was advanced to the level of the IVC. Under fluoroscopic guidance, the venotomy was serially dilated, ultimately allowing placement of a 20 cm temporary Trialysis catheter with tip ultimately terminating within the superior aspect of the right atrium. Final catheter positioning was confirmed and documented with a spot radiographic image. The catheter aspirates and flushes normally. The catheter was flushed with appropriate volume heparin dwells. The catheter exit site was secured with a 0-Prolene retention suture. A dressing was placed. The patient tolerated the procedure well without immediate post procedural complication. IMPRESSION: Successful placement of a right internal jugular approach 20 cm temporary dialysis catheter with tip terminating with in the superior aspect of the right atrium. This catheter may be converted to a tunneled dialysis catheter at a later date as indicated. The catheter is ready for immediate use. Electronically Signed   By: Sandi Mariscal M.D.   On: 05/02/2015 09:53   Dg Chest Port 1 View  05/03/2015  CLINICAL DATA:  Shortness of Breath EXAM: PORTABLE CHEST 1 VIEW COMPARISON:  04/28/2015 FINDINGS: Cardiomediastinal silhouette is stable. Dual lumen right IJ catheter with tip in right atrium. Central mild vascular congestion and mild perihilar interstitial prominence suspicious for mild interstitial edema. No segmental infiltrate. IMPRESSION: Cardiomegaly again noted. Dual lumen right IJ catheter with tip in right atrium. Central mild vascular congestion and mild perihilar interstitial prominence suspicious for mild interstitial edema. Electronically Signed   By: Lahoma Crocker M.D.   On: 05/03/2015 11:36   Dg Chest Port 1 View  04/28/2015  CLINICAL DATA:  Cough, wheezing for 2 weeks. EXAM: PORTABLE CHEST 1 VIEW COMPARISON:  04/26/2015 FINDINGS: Cardiomegaly. Decreasing lung volumes with increasing perihilar and lower lobe airspace opacities concerning for CHF. No effusions. No  acute bony abnormality. IMPRESSION: Cardiomegaly with increasing bilateral airspace disease, likely edema/ CHF. Low lung volumes. Electronically Signed   By: Rolm Baptise M.D.   On: 04/28/2015 11:11   Dg Swallowing Func-speech Pathology  05/03/2015  Objective Swallowing Evaluation:   MBS Patient Details Name: ZAKIYYAH SAVANNAH MRN: [id number] Date of Birth: 1929-11-18 Today's Date: 05/03/2015 Time: SLP Start Time (ACUTE ONLY): 1340-SLP Stop Time (ACUTE ONLY): 1400 SLP Time Calculation (min) (ACUTE ONLY): 20 min Past Medical History: Past Medical History Diagnosis Date . Uterine cancer ([place])  . Diabetes  mellitus    type II; peripheral neuropathy . Hypertension  . GERD (gastroesophageal reflux disease)  . Peripheral vascular disease ([place])    a. s/p L BKA 08/2012. . Obesity  . Dyslipidemia  . Chronic combined systolic and diastolic CHF (congestive heart failure) (HCC)    a. EF 50-55%, mild LVH and grade 1 diast. Dysfxn b. Grade 3 Diastolic Dysfunction 33/2951. b. 02/2015: EF 45-50% by cath. . Osteoarthritis cervical spine  . DM retinopathy ([place])  . Sleep apnea    with CPAP . History of shingles  . COPD (chronic obstructive pulmonary disease) ([place])  . Depression  . Peptic ulcer disease    duodenal . Peptic stricture of esophagus  . Hiatal hernia  . Diverticulosis  . Arthritis  . CAD (coronary artery disease)    a. s/p multiple caths with nonobs CAD;   b. cath 1/10: pLAD 20%, mLAD 40%, pCFX 20%, mCFX 40%, pRCA 60-70%;   c.  Myoview 06/02/12: Low anterior wall scar, no ischemia, EF 37%. d. cath 06/20/2014 70% mid LCx, medical therapy, high risk for PCI due to close proximity to large OM e. cath 03/07/15 pro RCA 50% & pro to mid Cx 75%, both stable, LVEDP 33-53m Hg-> medical management . Cardiomyopathy ([place]    a. Echo 05/31/12: EF 40-45%. b. Echo 06/2014: EF 50-55%. c. Cath 02/2015: EF 45-50%. . Asthma    Mild . Pruritic condition    Idiopathic . Zoster  . Noncompliance  . CKD (chronic kidney disease), stage III    Dr.  DLorrene Reid. Bradycardia    a. Not on BB due to this. . Shortness of breath dyspnea  Past Surgical History: Past Surgical History Procedure Laterality Date . Tubal ligation  1967 . Knee arthroscopy  10/1998   Left . Craniotomy  1997   Left for SDH . Cataract extraction, bilateral  2005 . Hernia repair   . Esophagogastroduodenoscopy  04/04/2004 . Spine surgery     C-spine and lumbar surgery . Cholecystectomy  2010 . Cardiac catheterization   . Dexa  7/05 . Amputation Left 09/04/2012   Procedure: AMPUTATION BELOW KNEE;  Surgeon: CAngelia Mould MD;  [facility]  Service: Vascular;  Laterality: Left; . Abdominal angiogram N/A 08/31/2012   Procedure: ABDOMINAL ANGIOGRAM with runoff poss intervention;  Surgeon: CAngelia Mould MD;  [facility];  Service: Cardiovascular;  Laterality: N/A; . Tubal ligation   . Left heart catheterization with coronary angiogram N/A 06/22/2014   Procedure: LEFT HEART CATHETERIZATION WITH CORONARY ANGIOGRAM;  Surgeon: CBurnell Blanks MD;  [facility];  Service: Cardiovascular;  Laterality: N/A; . Eye surgery   . Brain surgery     1997 blood clot on the brain, then had to relieve fluid on the brain . Multiple extractions with alveoloplasty N/A 08/01/2014   Procedure: EXTRACTIONS OF TEETH NUMBERS _0 AND 19 AND ALVEOLOPLASTY UPPER LEFT AND RIGHT QUADRANT;  Surgeon: CIsac Caddy DDS;  [facility]  Service: Oral Surgery;  Laterality: N/A; . Cardiac catheterization N/A 03/07/2015   Procedure: Left Heart Cath and Coronary Angiography;  Surgeon: DLeonie Man MD;  [facility];  Service: Cardiovascular;  Laterality: N/A; HPI: CKAYE LUOMAis an 81y.o. female patient with history of chronic combined CHF (EF 45-50 percent & [facility] CAD, DM 2, HTN, HLD, OSA on nightly CPAP, PAD status post left BKA, stage III CKD, COPD/? Asthma, bradycardia (  hence not on beta blockers), recent  hospitalization 9/19-9/22 for unstable angina at which time cath showed stable anatomy, now presented [facility] ED on 04/24[facility]15 with worsening dyspnea, orthopnea, productive cough, pleuritic appearing chest pain but no fevers. Recently seen by PCP and started on azithromycin for suspected pneumonia & Lasix dose increased. Noncompliant with fluid and water restriction. Admitted for decompensated CHF & question of pneumonia. Per MD notes, on 11/11 patient began to c/o coughing while eating. MBS recommended after swallow session this am.  No Data Recorded Assessment / Plan / Recommendation CHL IP CLINICAL IMPRESSIONS 05/03/2015 Therapy Diagnosis Mild pharyngeal phase dysphagia Clinical Impression Mild pharyngeal dysphagia as evidenced by delayed swallow initiation to pyriform sinuses due to decreased pharyngeal sensation. Mild pyriform sinus residue due to decreased laryngeal elevation cleared with verbal cues for second swallow. Pt coughed during MBS without barium observed in laryngeal vestibule during study. Recommend pt continue regular texture diet, thin liquids, straws allowed, small sips and bites, pills with thin (one at a time). Follow up x 1 for education.     CHL IP TREATMENT RECOMMENDATION 05/03/2015 Treatment Recommendations Therapy as outlined in treatment plan below   CHL IP DIET RECOMMENDATION 05/03/2015 SLP Diet Recommendations Regular solids;Thin liquid Liquid Administration via Cup;Straw Medication Administration Whole meds with liquid Compensations Slow rate;Small sips/bites;Multiple dry swallows after each bite/sip Postural Changes Seated upright at 90 degrees   CHL IP OTHER RECOMMENDATIONS 05/03/2015 Recommended Consults -- Oral Care Recommendations Oral care BID Other Recommendations --   CHL IP FOLLOW UP RECOMMENDATIONS 05/03/2015 Follow up Recommendations None   CHL IP FREQUENCY AND DURATION 05/03/2015 Speech Therapy Frequency (ACUTE ONLY) min 1 x/week Treatment Duration 1 week      CHL IP ORAL  PHASE 05/03/2015 Oral Phase WFL Oral - Pudding Teaspoon -- Oral - Pudding Cup -- Oral - Honey Teaspoon -- Oral - Honey Cup -- Oral - Nectar Teaspoon -- Oral - Nectar Cup -- Oral - Nectar Straw -- Oral - Thin Teaspoon -- Oral - Thin Cup -- Oral - Thin Straw -- Oral - Puree -- Oral - Mech Soft -- Oral - Regular -- Oral - Multi-Consistency -- Oral - Pill -- Oral Phase - Comment --  CHL IP PHARYNGEAL PHASE 05/03/2015 Pharyngeal Phase Thin;Solids Pharyngeal- Pudding Teaspoon -- Pharyngeal -- Pharyngeal- Pudding Cup -- Pharyngeal -- Pharyngeal- Honey Teaspoon -- Pharyngeal -- Pharyngeal- Honey Cup -- Pharyngeal -- Pharyngeal- Nectar Teaspoon -- Pharyngeal -- Pharyngeal- Nectar Cup -- Pharyngeal -- Pharyngeal- Nectar Straw -- Pharyngeal -- Pharyngeal- Thin Teaspoon NT Pharyngeal -- Pharyngeal- Thin Cup Delayed swallow initiation-pyriform sinuses;Pharyngeal residue - pyriform;Reduced laryngeal elevation Pharyngeal -- Pharyngeal- Thin Straw Delayed swallow initiation-pyriform sinuses;Pharyngeal residue - pyriform;Reduced laryngeal elevation Pharyngeal -- Pharyngeal- Puree Delayed swallow initiation-vallecula;Pharyngeal residue - pyriform;Reduced laryngeal elevation Pharyngeal -- Pharyngeal- Mechanical Soft NT Pharyngeal -- Pharyngeal- Regular Delayed swallow initiation-vallecula Pharyngeal -- Pharyngeal- Multi-consistency NT Pharyngeal -- Pharyngeal- Pill Delayed swallow initiation-vallecula Pharyngeal -- Pharyngeal Comment --  CHL IP CERVICAL ESOPHAGEAL PHASE 05/03/2015 Cervical Esophageal Phase WFL Pudding Teaspoon -- Pudding Cup -- Honey Teaspoon -- Honey Cup -- Nectar Teaspoon -- Nectar Cup -- Nectar Straw -- Thin Teaspoon -- Thin Cup -- Thin Straw -- Puree -- Mechanical Soft -- Regular -- Multi-consistency -- Pill -- Cervical Esophageal Comment -- [place] Siren 05/03/2015, 4:07 PM  Orbie Pyo Colvin Caroli.Ed CCC-SLP Pager [phone]              Microbiology: Recent Results (from the past 240 hour(s))  Surgical  pcr screen  Status: None   Collection Time: 05/09/15  5:15 PM  Result Value Ref Range Status   MRSA, PCR NEGATIVE NEGATIVE Final   Staphylococcus aureus NEGATIVE NEGATIVE Final    Comment:        The Xpert SA Assay (FDA approved for NASAL specimens in patients over 62 years of age), is one component of a comprehensive surveillance program.  Test performance has been validated by [facility] for patients greater than or equal to 88 year old. It is not intended to diagnose infection nor to guide or monitor treatment.      Labs: Basic Metabolic Panel:  Recent Labs Lab 05/09/15 0941 05/10/15 0422 05/12/15 0830 05/15/15 0527 05/15/15 0854  NA 136 137  136 135  --  136  K 3.7 3.8  3.8 4.2  --  4.2  CL 98* 97*  97* 96*  --  99*  CO2 _0 --  25  GLUCOSE 202* 96  95 205*  --  171*  BUN 57* 26*  26* 64*  --  76*  CREATININE 3.60* 2.72*  2.69* 3.88* 4.24* 3.96*  CALCIUM 8.5* 8.4*  8.3* 8.6*  --  9.0  PHOS 3.9 3.9 5.4*  --  5.3*   Liver Function Tests:  Recent Labs Lab 05/09/15 0941 05/10/15 0422 05/12/15 0830 05/15/15 0854  ALBUMIN 2.8* 3.0* 2.8* 3.0*   No results for input(s): LIPASE, AMYLASE in the last 168 hours. No results for input(s): AMMONIA in the last 168 hours. CBC:  Recent Labs Lab 05/09/15 0940 05/10/15 0422 05/12/15 0831 05/15/15 0854  WBC 13.1* 15.0* 10.7* 12.0*  NEUTROABS  --   --   --  9.4*  HGB 11.0* 11.5* 9.9* 10.8*  HCT 35.9* 37.7 32.0* 33.9*  MCV 84.5 83.6 84.7 83.5  PLT 263 241 221 252   Cardiac Enzymes: No results for input(s): CKTOTAL, CKMB, CKMBINDEX, TROPONINI in the last 168 hours. BNP: BNP (last 3 results)  Recent Labs  06/17/14 1330 06/17/14 1946 04/24/15 1738  BNP 1951.5* 1985.7* 1594.7*    ProBNP (last 3 results)  Recent Labs  06/08/14 1617  PROBNP 1249.0*    CBG:  Recent Labs Lab 05/14/15 0708 05/14/15 1112 05/14/15 1657 05/14/15 2059 05/15/15 0559  GLUCAP 474* 237* 136* 165* 224*        Signed:  Nita Sells  [facility] 05/15/2015, 10:44 AM

## 2015-05-15 NOTE — Care Management Important Message (Signed)
Important Message  Patient Details  Name: [name] MRN: [id number] Date of Birth: Jul 30, 1929   Medicare Important Message Given:  Yes    [name] 05/15/2015, 1:21 PM

## 2015-05-15 NOTE — Sedation Documentation (Signed)
Patient is resting comfortably. 

## 2015-05-15 NOTE — Progress Notes (Addendum)
Occupational Therapy Treatment Patient Details Name: TERRESSA HUTZELL MRN: [id number] DOB: [birth year] Today's Date: 05/15/2015    History of present illness XIAO KAUSHIK is a 81 y.o. female with history of nonobstructive CAD who has been recently admitted in September 2016 for unstable angina and at that time patient had unremarkable cardiac cath and patient had medications adjusted presents to the ER as patient has been having increasing shortness of breath over the last 1 week. Patient also has been having some productive cough with greenish sputum. Patient was placed on azithromycin by patient's primary care physician last week with increasing dose of Lasix from 40 mg daily to twice daily. Despite this patient has been getting short of breath even at rest. PMH includes L BKA, DM, heart failure, HTN, NSTEMI   OT comments  Pt. Making gains with skilled OT.  Tolerating increased activity and able to sit eob 10 min. With intermittent UE support on bed rail.  Also improved bed mobility this session.  Will continue to follow acutely.    Follow Up Recommendations  CIR    Equipment Recommendations       Recommendations for Other Services Rehab consult    Precautions / Restrictions Precautions Precautions: Fall Precaution Comments: L BKA with prosthesis       Mobility Bed Mobility Overal bed mobility: Needs Assistance Bed Mobility: Rolling;Sidelying to Sit Rolling: Min assist Sidelying to sit: Min assist;HOB elevated   Sit to supine: Max assist   General bed mobility comments: trunk support with cues to engage upright posture to promote weight bearing through R LE to assist with sitting balance.  intermittent support from R UE on bed rail but mostly able to maintain un supported sitting 10 min.   Transfers                      Balance     Sitting balance-Leahy Scale: Poor Sitting balance - Comments: did fluctutate and lean back at times                           ADL       Grooming: Oral care;Sitting;Cueing for compensatory techniques;Minimal assistance Grooming Details (indicate cue type and reason): pt. completed in un-supported sitting with intermittent R UE support on bed rail to correct post. lob                             Functional mobility during ADLs: Maximal assistance General ADL Comments: pt. able to sit eob 10 min. and complete various grooming tasks (applying lotion to legs, and oral care/placing max RPD, with MIN A      Vision                     Perception     Praxis      Cognition   Behavior During Therapy: WFL for tasks assessed/performed Overall Cognitive Status: Within Functional Limits for tasks assessed                       Extremity/Trunk Assessment               Exercises     Shoulder Instructions       General Comments      Pertinent Vitals/ Pain       Pain Assessment: No/denies pain  Home Living  Prior Functioning/Environment              Frequency Min 3X/week     Progress Toward Goals  OT Goals(current goals can now be found in the care plan section)  Progress towards OT goals: Progressing toward goals     Plan Discharge plan remains appropriate    Co-evaluation                 End of Session     Activity Tolerance Patient tolerated treatment well   Patient Left in bed;with call bell/phone within reach;with bed alarm set   Nurse Communication          Time: [phone] OT Time Calculation (min): 18 min  Charges: OT General Charges $OT Visit: 1 Procedure OT Treatments $Self Care/Home Management : 8-22 mins  Janice Coffin, COTA/L 05/15/2015, 8:55 AM

## 2015-05-15 NOTE — Progress Notes (Signed)
PT Cancellation Note  Patient Details Name: [name] MRN: [id number] DOB: 07/28/1929   Cancelled Treatment:    Reason Eval/Treat Not Completed: Other (comment) (Pt having a meal and husband upset about waiting for SNF).  Will see tomorrow if pt is still here.   Ramond Dial 05/15/2015, 1:55 PM   Mee Hives, PT MS Acute Rehab Dept. Number: [facility] [id number] and [place] [phone]

## 2015-05-15 NOTE — Procedures (Signed)
RIJV HD cath SVC RA No comp/EBL 

## 2015-05-15 NOTE — Progress Notes (Signed)
Pt is not an inpt rehab candidate. Dr. Naaman Plummer feels pt lacks the tolerance level for intense inpt rehab. He recommends SNF rehab. I have discussed with SW. We will sign off. [phone]

## 2015-05-17 ENCOUNTER — Telehealth: Payer: Self-pay | Admitting: Pulmonary Disease

## 2015-05-17 DIAGNOSIS — G4733 Obstructive sleep apnea (adult) (pediatric): Secondary | ICD-10-CM

## 2015-05-17 NOTE — Telephone Encounter (Signed)
Last seen on 07/21/14 with [facility] Next ov with RA on 07/25/15   Patient Instructions     Continue with cpap, and keep up with mask changes and supplies. Work on weight loss followup with me again in one year if doing well.    Called and spoke to [facility] at [place]. [name] states that patient came to [place] 3 days ago from the hospital. [name] states that patient's husband brought her CPAP in from home with a pressure setting of 9cm, but it is not functional. I asked [name] if patient would be able to come into office for an ov since she has not been since 07/21/14 with [facility]. She stated that patient would have to be transported by ambulance to ov. I explained to her that since the patient has not been seen in a year that I would have to send a message to RA. Renee voiced understanding and had no further questions. She requested that if we call before 3pm to ask for [name] and that [name] would be back on shift after 3pm.   Dr. Elsworth Soho please advise if okay to place order for new CPAP

## 2015-05-18 NOTE — Telephone Encounter (Signed)
Ok to place order for new CPAP 9 cm

## 2015-05-19 NOTE — Telephone Encounter (Signed)
Called and spoke with [name] at [facility] where patient resides.  She said that they get their supplies through [facility]- phone: [phone] Order entered for new CPAP machine and sent to [facility] Nothing further needed. Closing encounter

## 2015-05-22 ENCOUNTER — Encounter: Payer: Self-pay | Admitting: Internal Medicine

## 2015-05-22 ENCOUNTER — Non-Acute Institutional Stay (SKILLED_NURSING_FACILITY): Payer: Medicare Other | Admitting: Internal Medicine

## 2015-05-22 DIAGNOSIS — IMO0002 Reserved for concepts with insufficient information to code with codable children: Secondary | ICD-10-CM

## 2015-05-22 DIAGNOSIS — Z992 Dependence on renal dialysis: Secondary | ICD-10-CM

## 2015-05-22 DIAGNOSIS — I5042 Chronic combined systolic (congestive) and diastolic (congestive) heart failure: Secondary | ICD-10-CM

## 2015-05-22 DIAGNOSIS — E785 Hyperlipidemia, unspecified: Secondary | ICD-10-CM

## 2015-05-22 DIAGNOSIS — F0390 Unspecified dementia without behavioral disturbance: Secondary | ICD-10-CM

## 2015-05-22 DIAGNOSIS — I739 Peripheral vascular disease, unspecified: Secondary | ICD-10-CM

## 2015-05-22 DIAGNOSIS — J42 Unspecified chronic bronchitis: Secondary | ICD-10-CM

## 2015-05-22 DIAGNOSIS — M109 Gout, unspecified: Secondary | ICD-10-CM

## 2015-05-22 DIAGNOSIS — E1165 Type 2 diabetes mellitus with hyperglycemia: Secondary | ICD-10-CM

## 2015-05-22 DIAGNOSIS — G609 Hereditary and idiopathic neuropathy, unspecified: Secondary | ICD-10-CM | POA: Diagnosis not present

## 2015-05-22 DIAGNOSIS — Z794 Long term (current) use of insulin: Secondary | ICD-10-CM

## 2015-05-22 DIAGNOSIS — I10 Essential (primary) hypertension: Secondary | ICD-10-CM | POA: Diagnosis not present

## 2015-05-22 DIAGNOSIS — H409 Unspecified glaucoma: Secondary | ICD-10-CM

## 2015-05-22 DIAGNOSIS — K219 Gastro-esophageal reflux disease without esophagitis: Secondary | ICD-10-CM

## 2015-05-22 DIAGNOSIS — N186 End stage renal disease: Secondary | ICD-10-CM | POA: Diagnosis not present

## 2015-05-22 DIAGNOSIS — E1122 Type 2 diabetes mellitus with diabetic chronic kidney disease: Secondary | ICD-10-CM | POA: Diagnosis not present

## 2015-05-27 NOTE — Progress Notes (Signed)
Patient ID: [name], female   DOB: [birth year], 81 y.o.   MRN: [id number]    HISTORY AND PHYSICAL   DATE: 05/22/15  Location:  [facility]    Place of Service: SNF (31)   Extended Emergency Contact Information Primary Emergency Contact: Marner,Arthur Address: [address]          [place], [place] [address] [place] Phone: [phone] Mobile Phone: [phone] Relation: Spouse  Advanced Directive information  FULL CODE  Chief Complaint  Patient presents with  . New Admit To SNF    HPI:  81 yo female seen today as a new admission into SNF following hospital stay for acute/chronic diastolic HF, acute respiratory failure with hypoxia due to HCAP, PAD with hx left BKA, DM, NSVT with hx junctional rhythm, A/CKD, ESRD/HD - new start. She had a complicated prolonged hospital stay. Multiple meds adjusted. Cr worsened with diuresis. She began HD 05/02/15. Left arm AVF placed 05/10/15. Right IJ permacath placed 05/15/15. HCAP tx with IV vanco and ceftaz--> po levaquin and completed 14 days of tx. Cardio followed arrhythmia. Insulin adjusted due to hypoglycemia. Hgb 10.8 at d/c with Cr 4.24. She presents to SNF for short term rehab.  She has no c/o today. Daughter present and c/a her eye gtts she uses at night at home. No SOB. She takes HD and AVF placed in hospital. Pain controlled with norco. She is a poor historian due to dementia. Hx obtained from chart  DM - insulin dependent. She has neuropathy and takes gabapentin. She also has retinopathy and is followed by ophthamology. A1c 6.7%  Hyperlipidemia - stable on lipitor and zetia. LDL 31 in 08/2014  CAD/HTN/chronic HF - stable on imdur, ranexa, statin and ASA. She also takes amlodipine/hydralazine for BP control. Followed by cardio  PAD - s/p left BKA in 2014. She has pressure ulcers followed by wound care. Takes plavix/ASA  AOCD - stable. Takes iron infusion with HD. Hgb 10.8 at  d/c  ESRD - followed by nephrology. S/p left arm AVF. Takes calcitriol. Cr 4.24 at d/c. She is on a fluid restriction  COPD - stable on albuterol neb, advair  Dementia - stable on aricept. Gets nutritional supplements. Albumin 3.3  Depression -mood stable on zoloft  GERD - stable on protonix.   Gout - stable on allopurinol. Uric acid 4.6. No recent attacks  Past Medical History  Diagnosis Date  . Uterine cancer ([facility])   . Diabetes mellitus     type II; peripheral neuropathy  . Hypertension   . GERD (gastroesophageal reflux disease)   . Peripheral vascular disease ([facility])     a. s/p L BKA 08/2012.  . Obesity   . Dyslipidemia   . Chronic combined systolic and diastolic CHF (congestive heart failure) ([facility])     a. EF 50-55%, mild LVH and grade 1 diast. Dysfxn b. Grade 3 Diastolic Dysfunction 70/0174. b. 02/2015: EF 45-50% by cath.  . Osteoarthritis cervical spine   . DM retinopathy ([facility])   . Sleep apnea     with CPAP  . History of shingles   . COPD (chronic obstructive pulmonary disease) ([facility])   . Depression   . Peptic ulcer disease     duodenal  . Peptic stricture of esophagus   . Hiatal hernia   . Diverticulosis   . Arthritis   . CAD (coronary artery disease)     a. s/p multiple caths with nonobs CAD;   b. cath 1/10:  pLAD 20%, mLAD 40%, pCFX 20%, mCFX 40%, pRCA 60-70%;   c.  Myoview 06/02/12: Low anterior wall scar, no ischemia, EF 37%. d. cath 06/20/2014 70% mid LCx, medical therapy, high risk for PCI due to close proximity to large OM e. cath 03/07/15 pro RCA 50% & pro to mid Cx 75%, both stable, LVEDP 33-106mm Hg-> medical management  . Cardiomyopathy ([place])     a. Echo 05/31/12: EF 40-45%. b. Echo 06/2014: EF 50-55%. c. Cath 02/2015: EF 45-50%.  . Asthma     Mild  . Pruritic condition     Idiopathic  . Zoster   . Noncompliance   . CKD (chronic kidney disease), stage III     Dr. Lorrene Reid  . Bradycardia     a. Not on BB due to this.  . Shortness of breath dyspnea     Past  Surgical History  Procedure Laterality Date  . Tubal ligation  1967  . Knee arthroscopy  10/1998    Left  . Craniotomy  1997    Left for SDH  . Cataract extraction, bilateral  2005  . Hernia repair    . Esophagogastroduodenoscopy  04/04/2004  . Spine surgery      C-spine and lumbar surgery  . Cholecystectomy  2010  . Cardiac catheterization    . Dexa  7/05  . Amputation Left 09/04/2012    Procedure: AMPUTATION BELOW KNEE;  Surgeon: Angelia Mould, MD;  Location: [facility];  Service: Vascular;  Laterality: Left;  . Abdominal angiogram N/A 08/31/2012    Procedure: ABDOMINAL ANGIOGRAM with runoff poss intervention;  Surgeon: Angelia Mould, MD;  Location: [facility] CATH LAB;  Service: Cardiovascular;  Laterality: N/A;  . Tubal ligation    . Left heart catheterization with coronary angiogram N/A 06/22/2014    Procedure: LEFT HEART CATHETERIZATION WITH CORONARY ANGIOGRAM;  Surgeon: Burnell Blanks, MD;  Location: [facility] CATH LAB;  Service: Cardiovascular;  Laterality: N/A;  . Eye surgery    . Brain surgery      1997 blood clot on the brain, then had to relieve fluid on the brain  . Multiple extractions with alveoloplasty N/A 08/01/2014    Procedure: EXTRACTIONS OF TEETH NUMBERS $RemoveBefor'[id number] '[id number]$ AND 19 AND ALVEOLOPLASTY UPPER LEFT AND RIGHT QUADRANT;  Surgeon: Isac Caddy, DDS;  Location: [facility];  Service: Oral Surgery;  Laterality: N/A;  . Cardiac catheterization N/A 03/07/2015    Procedure: Left Heart Cath and Coronary Angiography;  Surgeon: Leonie Man, MD;  Location: [facility] CV LAB;  Service: Cardiovascular;  Laterality: N/A;  . Av fistula placement Left 05/10/2015    Procedure: CREATION OF LEFT UPPER ARM ARTERIOVENOUS (AV) FISTULA ;  Surgeon: Elam Dutch, MD;  Location: [facility];  Service: Vascular;  Laterality: Left;    Patient Care Team: Renato Shin, MD as PCP - General Renella Cunas, MD (Cardiology) Vania Rea, MD as Attending Physician (Obstetrics and  Gynecology) Hurman Horn, MD (Ophthalmology) Kathee Delton, MD (Pulmonary Disease) Jamal Maes, MD (Nephrology) Dorna Leitz, MD (Orthopedic Surgery) Angelia Mould, MD as Consulting Physician (Vascular Surgery)  Social History   Social History  . Marital Status: Married    Spouse Name: N/A  . Number of Children: 7  . Years of Education: N/A   Occupational History  . Retired    Social History Main Topics  . Smoking status: Never Smoker   . Smokeless tobacco: Never Used  . Alcohol Use: No  Comment: rare  . Drug Use: No  . Sexual Activity: No   Other Topics Concern  . Not on file   Social History Narrative     reports that she has never smoked. She has never used smokeless tobacco. She reports that she does not drink alcohol or use illicit drugs.  Family History  Problem Relation Age of Onset  . Cancer - Other Mother     "Stomach" Cancer  . Diabetes Mother   . Heart disease Mother   . Stomach cancer Mother   . Hypertension Mother   . Lymphoma Father   . Hypertension Father   . Kidney disease Paternal Grandmother   . Asthma Other   . Diabetes Sister   . Rheum arthritis Mother   . Rheum arthritis Father   . Heart attack Neg Hx   . Stroke Paternal Grandmother    Family Status  Relation Status Death Age  . Mother Deceased     Rheumatism  . Father Deceased     pacemaker, Rheumatism  . Paternal Grandfather Deceased   . Paternal Grandmother Deceased   . Maternal Grandfather Deceased   . Maternal Grandmother Deceased     Immunization History  Administered Date(s) Administered  . Pneumococcal Conjugate-13 02/06/2012  . Pneumococcal Polysaccharide-23 06/17/1993  . Td 04/18/2003  . Tdap 05/19/2014    Allergies  Allergen Reactions  . Iohexol Itching and Rash     Code: RASH, Desc: [place] ON PT'S CHART ALLERGIC TO IV DYE 09/04/07/RM, Onset Date: [id number]     Medications: Patient's Medications  New Prescriptions   No medications  on file  Previous Medications   ACETAMINOPHEN (TYLENOL) 500 MG TABLET    Take 500 mg by mouth every 6 (six) hours as needed for mild pain.   ALBUTEROL (PROVENTIL HFA;VENTOLIN HFA) 108 (90 BASE) MCG/ACT INHALER    Inhale 2 puffs into the lungs every 4 (four) hours as needed for wheezing or shortness of breath.   ALBUTEROL (PROVENTIL) (2.5 MG/3ML) 0.083% NEBULIZER SOLUTION    Take 3 mLs (2.5 mg total) by nebulization every 4 (four) hours as needed for wheezing or shortness of breath.   ALLOPURINOL (ZYLOPRIM) 100 MG TABLET    TAKE 1 TABLET BY MOUTH EVERY DAY   AMLODIPINE (NORVASC) 10 MG TABLET    TAKE 1 TABLET BY MOUTH DAILY   ASPIRIN EC 81 MG TABLET    Take 1 tablet (81 mg total) by mouth daily.   ATORVASTATIN (LIPITOR) 40 MG TABLET    TAKE 1 TABLET BY MOUTH DAILY AT 6 PM   CALCITRIOL (ROCALTROL) 0.25 MCG CAPSULE    TAKE ONE CAPSULE BY MOUTH EVERY DAY   CLOPIDOGREL (PLAVIX) 75 MG TABLET    Take 75 mg by mouth daily.   DONEPEZIL (ARICEPT) 5 MG TABLET    Take 1 tablet (5 mg total) by mouth at bedtime.   FEEDING SUPPLEMENT (GLUCERNA SHAKE) LIQD    Take 237 mLs by mouth every morning.    FLUTICASONE-SALMETEROL (ADVAIR DISKUS) 100-50 MCG/DOSE AEPB    Inhale 1 puff into the lungs 2 (two) times daily.   GABAPENTIN (NEURONTIN) 100 MG CAPSULE    Take 1 capsule (100 mg total) by mouth at bedtime.   HYDRALAZINE (APRESOLINE) 25 MG TABLET    TAKE 1 TABLET BY MOUTH THREE TIMES DAILY   HYDROCODONE-ACETAMINOPHEN (NORCO) 10-325 MG TABLET    Take 1 tablet by mouth every 6 (six) hours as needed for moderate pain.   INSULIN NPH-REGULAR HUMAN (  NOVOLIN 70/30) (70-30) 100 UNIT/ML INJECTION    Inject 14 Units into the skin daily with breakfast. Sliding scale. Only takes in the morning   ISOSORBIDE MONONITRATE (IMDUR) 60 MG 24 HR TABLET    Take 60 mg by mouth daily.   LATANOPROST (XALATAN) 0.005 % OPHTHALMIC SOLUTION    Place 1 drop into both eyes at bedtime.   NITROGLYCERIN (NITROSTAT) 0.4 MG SL TABLET    Place 0.4 mg  under the tongue every 5 (five) minutes as needed for chest pain.    ONE TOUCH ULTRA TEST TEST STRIP    USE TO TEST BLOOD SUGAR TWICE DAILY.   PANTOPRAZOLE (PROTONIX) 40 MG TABLET    TAKE 1 TABLET BY MOUTH TWICE DAILY   RANOLAZINE (RANEXA) 500 MG 12 HR TABLET    Take 1 tablet (500 mg total) by mouth 2 (two) times daily.   SERTRALINE (ZOLOFT) 100 MG TABLET    TAKE 1 TABLET BY MOUTH EVERY DAY   ZETIA 10 MG TABLET    TAKE 1 TABLET BY MOUTH EVERY MORNING  Modified Medications   No medications on file  Discontinued Medications   SORBITOL 70 % SOLN    Take 20 mLs by mouth daily as needed for moderate constipation.    Review of Systems  Unable to perform ROS: Dementia    Filed Vitals:   05/22/15 1343  BP: 136/79  Pulse: 71  Temp: 98.2 F (36.8 C)  TempSrc: Oral  Resp: 19  Height: $Remove'5\' 6"'[id number]$  (1.676 m)  Weight: 171 lb 12.8 oz (77.928 kg)   Body mass index is 27.74 kg/(m^2).  Physical Exam  Constitutional: She appears well-developed.  HENT:  Mouth/Throat: Oropharynx is clear and moist. No oropharyngeal exudate.  Eyes: Pupils are equal, round, and reactive to light. No scleral icterus.  Neck: Neck supple. Carotid bruit is not present. No tracheal deviation present. No thyromegaly present.  Cardiovascular: Normal rate, regular rhythm, normal heart sounds and intact distal pulses.  Exam reveals no gallop and no friction rub.   No murmur heard. Left arm AVF with palpable thrill/audible bruit. Left BKA. No RLE edema. No right calf TTP  Pulmonary/Chest: Effort normal and breath sounds normal. No stridor. No respiratory distress. She has no wheezes. She has no rales.  Right ACW permacath intact with no redness or d/c at insertion site  Abdominal: Soft. Bowel sounds are normal. She exhibits no distension and no mass. There is no hepatomegaly. There is no tenderness. There is no rebound and no guarding.  Musculoskeletal: She exhibits edema.  Left BKA  Lymphadenopathy:    She has no cervical  adenopathy.  Neurological: She is alert.  Skin: Skin is warm and dry. No rash noted.  Psychiatric: She has a normal mood and affect. Her behavior is normal.     Labs reviewed: Admission on 04/24/2015, Discharged on 05/15/2015  No results displayed because visit has over 200 results.    Office Visit on 04/19/2015  Component Date Value Ref Range Status  . Hemoglobin A1C 04/19/2015 6.7   Final  Office Visit on 03/16/2015  Component Date Value Ref Range Status  . Sodium 03/16/2015 140  135 - 145 mEq/L Final  . Potassium 03/16/2015 3.9  3.5 - 5.1 mEq/L Final  . Chloride 03/16/2015 103  96 - 112 mEq/L Final  . CO2 03/16/2015 29  19 - 32 mEq/L Final  . Glucose, Bld 03/16/2015 217* 70 - 99 mg/dL Final  . BUN 03/16/2015 28* 6 - 23 mg/dL Final  .  Creatinine, Ser 03/16/2015 1.99* 0.40 - 1.20 mg/dL Final  . Calcium 03/16/2015 8.9  8.4 - 10.5 mg/dL Final  . GFR 03/16/2015 30.74* >60.00 mL/min Final  Admission on 03/06/2015, Discharged on 03/09/2015  Component Date Value Ref Range Status  . Sodium 03/06/2015 143  135 - 145 mmol/L Final  . Potassium 03/06/2015 4.2  3.5 - 5.1 mmol/L Final  . Chloride 03/06/2015 108  101 - 111 mmol/L Final  . CO2 03/06/2015 25  22 - 32 mmol/L Final  . Glucose, Bld 03/06/2015 168* 65 - 99 mg/dL Final  . BUN 03/06/2015 11  6 - 20 mg/dL Final  . Creatinine, Ser 03/06/2015 1.57* 0.44 - 1.00 mg/dL Final  . Calcium 03/06/2015 9.1  8.9 - 10.3 mg/dL Final  . GFR calc non Af Amer 03/06/2015 29* >60 mL/min Final  . GFR calc Af Amer 03/06/2015 34* >60 mL/min Final   Comment: (NOTE) The eGFR has been calculated using the CKD EPI equation. This calculation has not been validated in all clinical situations. eGFR's persistently <60 mL/min signify possible Chronic Kidney Disease.   . Anion gap 03/06/2015 10  5 - 15 Final  . WBC 03/06/2015 6.5  4.0 - 10.5 K/uL Final  . RBC 03/06/2015 4.61  3.87 - 5.11 MIL/uL Final  . Hemoglobin 03/06/2015 12.6  12.0 - 15.0 g/dL Final  .  HCT 03/06/2015 39.8  36.0 - 46.0 % Final  . MCV 03/06/2015 86.3  78.0 - 100.0 fL Final  . MCH 03/06/2015 27.3  26.0 - 34.0 pg Final  . MCHC 03/06/2015 31.7  30.0 - 36.0 g/dL Final  . RDW 03/06/2015 18.2* 11.5 - 15.5 % Final  . Platelets 03/06/2015 296  150 - 400 K/uL Final  . Troponin i, poc 03/06/2015 0.13* 0.00 - 0.08 ng/mL Final  . Comment 03/06/2015 NOTIFIED PHYSICIAN   Final  . Comment 3 03/06/2015          Final   Comment: Due to the release kinetics of cTnI, a negative result within the first hours of the onset of symptoms does not rule out myocardial infarction with certainty. If myocardial infarction is still suspected, repeat the test at appropriate intervals.   . Heparin Unfractionated 03/07/2015 0.73* 0.30 - 0.70 IU/mL Final   Comment:        IF HEPARIN RESULTS ARE BELOW EXPECTED VALUES, AND PATIENT DOSAGE HAS BEEN CONFIRMED, SUGGEST FOLLOW UP TESTING OF ANTITHROMBIN III LEVELS.   . WBC 03/07/2015 7.6  4.0 - 10.5 K/uL Final  . RBC 03/07/2015 4.38  3.87 - 5.11 MIL/uL Final  . Hemoglobin 03/07/2015 11.5* 12.0 - 15.0 g/dL Final  . HCT 03/07/2015 37.7  36.0 - 46.0 % Final  . MCV 03/07/2015 86.1  78.0 - 100.0 fL Final  . MCH 03/07/2015 26.3  26.0 - 34.0 pg Final  . MCHC 03/07/2015 30.5  30.0 - 36.0 g/dL Final  . RDW 03/07/2015 17.6* 11.5 - 15.5 % Final  . Platelets 03/07/2015 321  150 - 400 K/uL Final  . Glucose-Capillary 03/06/2015 217* 65 - 99 mg/dL Final  . Troponin I 03/06/2015 0.19* <0.031 ng/mL Final   Comment:        PERSISTENTLY INCREASED TROPONIN VALUES IN THE RANGE OF 0.04-0.49 ng/mL CAN BE SEEN IN:       -UNSTABLE ANGINA       -CONGESTIVE HEART FAILURE       -MYOCARDITIS       -CHEST TRAUMA       -ARRYHTHMIAS       -  LATE PRESENTING MYOCARDIAL INFARCTION       -COPD   CLINICAL FOLLOW-UP RECOMMENDED.   Marland Kitchen Troponin I 03/07/2015 0.19* <0.031 ng/mL Final   Comment:        PERSISTENTLY INCREASED TROPONIN VALUES IN THE RANGE OF 0.04-0.49 ng/mL CAN BE SEEN  IN:       -UNSTABLE ANGINA       -CONGESTIVE HEART FAILURE       -MYOCARDITIS       -CHEST TRAUMA       -ARRYHTHMIAS       -LATE PRESENTING MYOCARDIAL INFARCTION       -COPD   CLINICAL FOLLOW-UP RECOMMENDED.   Marland Kitchen Troponin I 03/07/2015 0.17* <0.031 ng/mL Final   Comment:        PERSISTENTLY INCREASED TROPONIN VALUES IN THE RANGE OF 0.04-0.49 ng/mL CAN BE SEEN IN:       -UNSTABLE ANGINA       -CONGESTIVE HEART FAILURE       -MYOCARDITIS       -CHEST TRAUMA       -ARRYHTHMIAS       -LATE PRESENTING MYOCARDIAL INFARCTION       -COPD   CLINICAL FOLLOW-UP RECOMMENDED.   Marland Kitchen Sodium 03/07/2015 138  135 - 145 mmol/L Final  . Potassium 03/07/2015 4.0  3.5 - 5.1 mmol/L Final  . Chloride 03/07/2015 103  101 - 111 mmol/L Final  . CO2 03/07/2015 25  22 - 32 mmol/L Final  . Glucose, Bld 03/07/2015 221* 65 - 99 mg/dL Final  . BUN 03/07/2015 15  6 - 20 mg/dL Final  . Creatinine, Ser 03/07/2015 1.75* 0.44 - 1.00 mg/dL Final  . Calcium 03/07/2015 8.5* 8.9 - 10.3 mg/dL Final  . GFR calc non Af Amer 03/07/2015 26* >60 mL/min Final  . GFR calc Af Amer 03/07/2015 30* >60 mL/min Final   Comment: (NOTE) The eGFR has been calculated using the CKD EPI equation. This calculation has not been validated in all clinical situations. eGFR's persistently <60 mL/min signify possible Chronic Kidney Disease.   . Anion gap 03/07/2015 10  5 - 15 Final  . Prothrombin Time 03/07/2015 15.7* 11.6 - 15.2 seconds Final  . INR 03/07/2015 1.23  0.00 - 1.49 Final  . Glucose-Capillary 03/06/2015 204* 65 - 99 mg/dL Final  . Heparin Unfractionated 03/06/2015 0.86* 0.30 - 0.70 IU/mL Final   Comment:        IF HEPARIN RESULTS ARE BELOW EXPECTED VALUES, AND PATIENT DOSAGE HAS BEEN CONFIRMED, SUGGEST FOLLOW UP TESTING OF ANTITHROMBIN III LEVELS.   Marland Kitchen Sodium 03/07/2015 137  135 - 145 mmol/L Final  . Potassium 03/07/2015 4.1  3.5 - 5.1 mmol/L Final  . Chloride 03/07/2015 106  101 - 111 mmol/L Final  . CO2 03/07/2015 21*  22 - 32 mmol/L Final  . Glucose, Bld 03/07/2015 212* 65 - 99 mg/dL Final  . BUN 03/07/2015 16  6 - 20 mg/dL Final  . Creatinine, Ser 03/07/2015 1.64* 0.44 - 1.00 mg/dL Final  . Calcium 03/07/2015 8.2* 8.9 - 10.3 mg/dL Final  . GFR calc non Af Amer 03/07/2015 28* >60 mL/min Final  . GFR calc Af Amer 03/07/2015 32* >60 mL/min Final   Comment: (NOTE) The eGFR has been calculated using the CKD EPI equation. This calculation has not been validated in all clinical situations. eGFR's persistently <60 mL/min signify possible Chronic Kidney Disease.   . Anion gap 03/07/2015 10  5 - 15 Final  . Glucose-Capillary 03/07/2015 192* 65 - 99  mg/dL Final  . MRSA by PCR 03/07/2015 NEGATIVE  NEGATIVE Final   Comment:        The GeneXpert MRSA Assay (FDA approved for NASAL specimens only), is one component of a comprehensive MRSA colonization surveillance program. It is not intended to diagnose MRSA infection nor to guide or monitor treatment for MRSA infections.   . Glucose-Capillary 03/07/2015 146* 65 - 99 mg/dL Final  . Glucose-Capillary 03/07/2015 290* 65 - 99 mg/dL Final  . Sodium 03/08/2015 135  135 - 145 mmol/L Final  . Potassium 03/08/2015 4.4  3.5 - 5.1 mmol/L Final  . Chloride 03/08/2015 101  101 - 111 mmol/L Final  . CO2 03/08/2015 24  22 - 32 mmol/L Final  . Glucose, Bld 03/08/2015 381* 65 - 99 mg/dL Final  . BUN 03/08/2015 17  6 - 20 mg/dL Final  . Creatinine, Ser 03/08/2015 1.83* 0.44 - 1.00 mg/dL Final  . Calcium 03/08/2015 8.4* 8.9 - 10.3 mg/dL Final  . GFR calc non Af Amer 03/08/2015 24* >60 mL/min Final  . GFR calc Af Amer 03/08/2015 28* >60 mL/min Final   Comment: (NOTE) The eGFR has been calculated using the CKD EPI equation. This calculation has not been validated in all clinical situations. eGFR's persistently <60 mL/min signify possible Chronic Kidney Disease.   . Anion gap 03/08/2015 10  5 - 15 Final  . Glucose-Capillary 03/07/2015 281* 65 - 99 mg/dL Final  .  Glucose-Capillary 03/08/2015 325* 65 - 99 mg/dL Final  . Comment 1 03/08/2015 Document in Chart   Final  . Glucose-Capillary 03/08/2015 325* 65 - 99 mg/dL Final  . Comment 1 03/08/2015 Document in Chart   Final  . Glucose-Capillary 03/08/2015 234* 65 - 99 mg/dL Final  . Comment 1 03/08/2015 Notify RN   Final  . Comment 2 03/08/2015 Document in Chart   Final  . Sodium 03/09/2015 137  135 - 145 mmol/L Final  . Potassium 03/09/2015 3.8  3.5 - 5.1 mmol/L Final  . Chloride 03/09/2015 105  101 - 111 mmol/L Final  . CO2 03/09/2015 25  22 - 32 mmol/L Final  . Glucose, Bld 03/09/2015 293* 65 - 99 mg/dL Final  . BUN 03/09/2015 26* 6 - 20 mg/dL Final  . Creatinine, Ser 03/09/2015 1.94* 0.44 - 1.00 mg/dL Final  . Calcium 03/09/2015 8.2* 8.9 - 10.3 mg/dL Final  . GFR calc non Af Amer 03/09/2015 23* >60 mL/min Final  . GFR calc Af Amer 03/09/2015 26* >60 mL/min Final   Comment: (NOTE) The eGFR has been calculated using the CKD EPI equation. This calculation has not been validated in all clinical situations. eGFR's persistently <60 mL/min signify possible Chronic Kidney Disease.   . Anion gap 03/09/2015 7  5 - 15 Final    US Renal  05/01/2015  CLINICAL DATA:  Renal failure. EXAM: RENAL / URINARY TRACT ULTRASOUND COMPLETE COMPARISON:  CT, 12/05/2013 FINDINGS: Right Kidney: Length: 10.6 cm. Echogenicity within normal limits. No mass or hydronephrosis visualized. Left Kidney: Length: 10.3 cm. Echogenicity within normal limits. No mass or hydronephrosis visualized. Bladder: Decompressed with a Foley catheter IMPRESSION: Normal renal ultrasound. Electronically Signed   By: Lajean Manes M.D.   On: 05/01/2015 12:46   Ir Fluoro Guide Cv Line Right  05/15/2015  CLINICAL DATA:  Renal failure. Temporary dialysis catheter in place. EXAM: TUNNELED DIALYSIS CATHETER PLACEMENT, ULTRASOUND GUIDANCE FOR VASCULAR ACCESS FLUOROSCOPY TIME:  One minutes MEDICATIONS AND MEDICAL HISTORY: Versed 0.5 mg, Fentanyl 50 mcg.  As antibiotic prophylaxis,  Ancef was ordered pre-procedure and administered intravenously within one hour of incision. ANESTHESIA/SEDATION: Moderate sedation time: 22 minutes CONTRAST:  None PROCEDURE: The procedure, risks, benefits, and alternatives were explained to the patient. Questions regarding the procedure were encouraged and answered. The patient understands and consents to the procedure. The neck was prepped with Betadine in a sterile fashion, and a sterile drape was applied covering the operative field. A sterile gown and sterile gloves were used for the procedure. 1% lidocaine into the skin and subcutaneous tissue. The existing temporary dialysis catheter was removed over an Amplatz wire. This was advanced into the IVC. A small incision was made in the right upper chest. The tunneling device was utilized to advance the 23 centimeter tip to cuff catheter from the chest incision and out the neck incision. A peel-away sheath was advanced over the Amplatz wire. The leading edge of the catheter was then advanced through the peel-away sheath. The peel-away sheath was removed. It was flushed and instilled with heparin. The chest incision was closed with a 0 Prolene pursestring stitch. The neck incision was closed with a 4-0 Vicryl subcuticular stitch. COMPLICATIONS: None FINDINGS: The image demonstrates placement of a tunneled dialysis catheter with its tip in the right atrium. IMPRESSION: Successful right IJ vein tunneled dialysis catheter conversion with its tip in the right atrium. Electronically Signed   By: Marybelle Killings M.D.   On: 05/15/2015 10:48   Ir Fluoro Guide Cv Line Right  05/02/2015  INDICATION: Acute on chronic renal insufficiency. Please place temporary dialysis catheter for the initiation of dialysis. EXAM: NON-TUNNELED CENTRAL VENOUS HEMODIALYSIS CATHETER PLACEMENT WITH ULTRASOUND AND FLUOROSCOPIC GUIDANCE COMPARISON:  None. MEDICATIONS: None CONTRAST:  None FLUOROSCOPY TIME:  24 seconds  (61.4 mGy) COMPLICATIONS: None immediate PROCEDURE: Informed written consent was obtained from the patient after a discussion of the risks, benefits, and alternatives to treatment. Questions regarding the procedure were encouraged and answered. The right neck and chest were prepped with chlorhexidine in a sterile fashion, and a sterile drape was applied covering the operative field. Maximum barrier sterile technique with sterile gowns and gloves were used for the procedure. A timeout was performed prior to the initiation of the procedure. After creating a small venotomy incision, a micropuncture kit was utilized to access the right internal jugular vein under direct, real-time ultrasound guidance after the overlying soft tissues were anesthetized with 1% lidocaine with epinephrine. Ultrasound image documentation was performed. The microwire was kinked to measure appropriate catheter length. A stiff glidewire was advanced to the level of the IVC. Under fluoroscopic guidance, the venotomy was serially dilated, ultimately allowing placement of a 20 cm temporary Trialysis catheter with tip ultimately terminating within the superior aspect of the right atrium. Final catheter positioning was confirmed and documented with a spot radiographic image. The catheter aspirates and flushes normally. The catheter was flushed with appropriate volume heparin dwells. The catheter exit site was secured with a 0-Prolene retention suture. A dressing was placed. The patient tolerated the procedure well without immediate post procedural complication. IMPRESSION: Successful placement of a right internal jugular approach 20 cm temporary dialysis catheter with tip terminating with in the superior aspect of the right atrium. This catheter may be converted to a tunneled dialysis catheter at a later date as indicated. The catheter is ready for immediate use. Electronically Signed   By: Sandi Mariscal M.D.   On: 05/02/2015 09:53   Ir US Guide  Vasc Access Right  05/02/2015  INDICATION: Acute on chronic renal  insufficiency. Please place temporary dialysis catheter for the initiation of dialysis. EXAM: NON-TUNNELED CENTRAL VENOUS HEMODIALYSIS CATHETER PLACEMENT WITH ULTRASOUND AND FLUOROSCOPIC GUIDANCE COMPARISON:  None. MEDICATIONS: None CONTRAST:  None FLUOROSCOPY TIME:  24 seconds (94.8 mGy) COMPLICATIONS: None immediate PROCEDURE: Informed written consent was obtained from the patient after a discussion of the risks, benefits, and alternatives to treatment. Questions regarding the procedure were encouraged and answered. The right neck and chest were prepped with chlorhexidine in a sterile fashion, and a sterile drape was applied covering the operative field. Maximum barrier sterile technique with sterile gowns and gloves were used for the procedure. A timeout was performed prior to the initiation of the procedure. After creating a small venotomy incision, a micropuncture kit was utilized to access the right internal jugular vein under direct, real-time ultrasound guidance after the overlying soft tissues were anesthetized with 1% lidocaine with epinephrine. Ultrasound image documentation was performed. The microwire was kinked to measure appropriate catheter length. A stiff glidewire was advanced to the level of the IVC. Under fluoroscopic guidance, the venotomy was serially dilated, ultimately allowing placement of a 20 cm temporary Trialysis catheter with tip ultimately terminating within the superior aspect of the right atrium. Final catheter positioning was confirmed and documented with a spot radiographic image. The catheter aspirates and flushes normally. The catheter was flushed with appropriate volume heparin dwells. The catheter exit site was secured with a 0-Prolene retention suture. A dressing was placed. The patient tolerated the procedure well without immediate post procedural complication. IMPRESSION: Successful placement of a right  internal jugular approach 20 cm temporary dialysis catheter with tip terminating with in the superior aspect of the right atrium. This catheter may be converted to a tunneled dialysis catheter at a later date as indicated. The catheter is ready for immediate use. Electronically Signed   By: Sandi Mariscal M.D.   On: 05/02/2015 09:53   Dg Chest Port 1 View  05/03/2015  CLINICAL DATA:  Shortness of Breath EXAM: PORTABLE CHEST 1 VIEW COMPARISON:  04/28/2015 FINDINGS: Cardiomediastinal silhouette is stable. Dual lumen right IJ catheter with tip in right atrium. Central mild vascular congestion and mild perihilar interstitial prominence suspicious for mild interstitial edema. No segmental infiltrate. IMPRESSION: Cardiomegaly again noted. Dual lumen right IJ catheter with tip in right atrium. Central mild vascular congestion and mild perihilar interstitial prominence suspicious for mild interstitial edema. Electronically Signed   By: Lahoma Crocker M.D.   On: 05/03/2015 11:36   Dg Chest Port 1 View  04/28/2015  CLINICAL DATA:  Cough, wheezing for 2 weeks. EXAM: PORTABLE CHEST 1 VIEW COMPARISON:  04/26/2015 FINDINGS: Cardiomegaly. Decreasing lung volumes with increasing perihilar and lower lobe airspace opacities concerning for CHF. No effusions. No acute bony abnormality. IMPRESSION: Cardiomegaly with increasing bilateral airspace disease, likely edema/ CHF. Low lung volumes. Electronically Signed   By: Rolm Baptise M.D.   On: 04/28/2015 11:11   Dg Swallowing Func-speech Pathology  05/03/2015  Objective Swallowing Evaluation:   MBS Patient Details Name: [name] MRN: [id number] Date of Birth: 07/02/1929 Today's Date: 05/03/2015 Time: SLP Start Time (ACUTE ONLY): 1340-SLP Stop Time (ACUTE ONLY): 1400 SLP Time Calculation (min) (ACUTE ONLY): 20 min Past Medical History: Past Medical History Diagnosis Date . Uterine cancer ([place])  . Diabetes mellitus    type II; peripheral neuropathy . Hypertension  . GERD  (gastroesophageal reflux disease)  . Peripheral vascular disease ([place])    a. s/p L BKA 08/2012. . Obesity  . Dyslipidemia  .  Chronic combined systolic and diastolic CHF (congestive heart failure) (HCC)    a. EF 50-55%, mild LVH and grade 1 diast. Dysfxn b. Grade 3 Diastolic Dysfunction 73/4193. b. 02/2015: EF 45-50% by cath. . Osteoarthritis cervical spine  . DM retinopathy ([place])  . Sleep apnea    with CPAP . History of shingles  . COPD (chronic obstructive pulmonary disease) ([place])  . Depression  . Peptic ulcer disease    duodenal . Peptic stricture of esophagus  . Hiatal hernia  . Diverticulosis  . Arthritis  . CAD (coronary artery disease)    a. s/p multiple caths with nonobs CAD;   b. cath 1/10: pLAD 20%, mLAD 40%, pCFX 20%, mCFX 40%, pRCA 60-70%;   c.  Myoview 06/02/12: Low anterior wall scar, no ischemia, EF 37%. d. cath 06/20/2014 70% mid LCx, medical therapy, high risk for PCI due to close proximity to large OM e. cath 03/07/15 pro RCA 50% & pro to mid Cx 75%, both stable, LVEDP 33-73mm Hg-> medical management . Cardiomyopathy ([place])    a. Echo 05/31/12: EF 40-45%. b. Echo 06/2014: EF 50-55%. c. Cath 02/2015: EF 45-50%. . Asthma    Mild . Pruritic condition    Idiopathic . Zoster  . Noncompliance  . CKD (chronic kidney disease), stage III    Dr. Lorrene Reid . Bradycardia    a. Not on BB due to this. . Shortness of breath dyspnea  Past Surgical History: Past Surgical History Procedure Laterality Date . Tubal ligation  1967 . Knee arthroscopy  10/1998   Left . Craniotomy  1997   Left for SDH . Cataract extraction, bilateral  2005 . Hernia repair   . Esophagogastroduodenoscopy  04/04/2004 . Spine surgery     C-spine and lumbar surgery . Cholecystectomy  2010 . Cardiac catheterization   . Dexa  7/05 . Amputation Left 09/04/2012   Procedure: AMPUTATION BELOW KNEE;  Surgeon: Angelia Mould, MD;  Location: [place];  Service: Vascular;  Laterality: Left; . Abdominal angiogram N/A 08/31/2012   Procedure: ABDOMINAL ANGIOGRAM  with runoff poss intervention;  Surgeon: Angelia Mould, MD;  Location[facility] CATH LAB;  Service: Cardiovascular;  Laterality: N/A; . Tubal ligation   . Left heart catheterization with coronary angiogram N/A 06/22/2014   Procedure: LEFT HEART CATHETERIZATION WITH CORONARY ANGIOGRAM;  Surgeon: Burnell Blanks, MD;  Location: [facility] CATH LAB;  Service: Cardiovascular;  Laterality: N/A; . Eye surgery   . Brain surgery     1997 blood clot on the brain, then had to relieve fluid on the brain . Multiple extractions with alveoloplasty N/A 08/01/2014   Procedure: EXTRACTIONS OF TEETH NUMBERS $RemoveBefor'[id number] 11 '[id number]$ AND 19 AND ALVEOLOPLASTY UPPER LEFT AND RIGHT QUADRANT;  Surgeon: Isac Caddy, DDS;  Location: [place];  Service: Oral Surgery;  Laterality: N/A; . Cardiac catheterization N/A 03/07/2015   Procedure: Left Heart Cath and Coronary Angiography;  Surgeon: Leonie Man, MD;  Location: [place] CV LAB;  Service: Cardiovascular;  Laterality: N/A; HPI: [name] is an 81 y.o. female patient with history of chronic combined CHF (EF 45-50 percent & grade 3 diastolic dysfunction by [facility] 02/2015), CAD, DM 2, HTN, HLD, OSA on nightly CPAP, PAD status post left BKA, stage III CKD, COPD/? Asthma, bradycardia (hence not on beta blockers), recent hospitalization 9/19-9/22 for unstable angina at which time cath showed stable anatomy, now presented [facility] ED on 04/24/15 with worsening dyspnea, orthopnea, productive cough, pleuritic appearing chest pain but no  fevers. Recently seen by PCP and started on azithromycin for suspected pneumonia & Lasix dose increased. Noncompliant with fluid and water restriction. Admitted for decompensated CHF & question of pneumonia. Per MD notes, on 11/11 patient began to c/o coughing while eating. MBS recommended after swallow session this am.  No Data Recorded Assessment / Plan / Recommendation CHL IP CLINICAL IMPRESSIONS 05/03/2015 Therapy Diagnosis Mild pharyngeal phase dysphagia  Clinical Impression Mild pharyngeal dysphagia as evidenced by delayed swallow initiation to pyriform sinuses due to decreased pharyngeal sensation. Mild pyriform sinus residue due to decreased laryngeal elevation cleared with verbal cues for second swallow. Pt coughed during MBS without barium observed in laryngeal vestibule during study. Recommend pt continue regular texture diet, thin liquids, straws allowed, small sips and bites, pills with thin (one at a time). Follow up x 1 for education.     CHL IP TREATMENT RECOMMENDATION 05/03/2015 Treatment Recommendations Therapy as outlined in treatment plan below   CHL IP DIET RECOMMENDATION 05/03/2015 SLP Diet Recommendations Regular solids;Thin liquid Liquid Administration via Cup;Straw Medication Administration Whole meds with liquid Compensations Slow rate;Small sips/bites;Multiple dry swallows after each bite/sip Postural Changes Seated upright at 90 degrees   CHL IP OTHER RECOMMENDATIONS 05/03/2015 Recommended Consults -- Oral Care Recommendations Oral care BID Other Recommendations --   CHL IP FOLLOW UP RECOMMENDATIONS 05/03/2015 Follow up Recommendations None   CHL IP FREQUENCY AND DURATION 05/03/2015 Speech Therapy Frequency (ACUTE ONLY) min 1 x/week Treatment Duration 1 week      CHL IP ORAL PHASE 05/03/2015 Oral Phase WFL Oral - Pudding Teaspoon -- Oral - Pudding Cup -- Oral - Honey Teaspoon -- Oral - Honey Cup -- Oral - Nectar Teaspoon -- Oral - Nectar Cup -- Oral - Nectar Straw -- Oral - Thin Teaspoon -- Oral - Thin Cup -- Oral - Thin Straw -- Oral - Puree -- Oral - Mech Soft -- Oral - Regular -- Oral - Multi-Consistency -- Oral - Pill -- Oral Phase - Comment --  CHL IP PHARYNGEAL PHASE 05/03/2015 Pharyngeal Phase Thin;Solids Pharyngeal- Pudding Teaspoon -- Pharyngeal -- Pharyngeal- Pudding Cup -- Pharyngeal -- Pharyngeal- Honey Teaspoon -- Pharyngeal -- Pharyngeal- Honey Cup -- Pharyngeal -- Pharyngeal- Nectar Teaspoon -- Pharyngeal -- Pharyngeal- Nectar  Cup -- Pharyngeal -- Pharyngeal- Nectar Straw -- Pharyngeal -- Pharyngeal- Thin Teaspoon NT Pharyngeal -- Pharyngeal- Thin Cup Delayed swallow initiation-pyriform sinuses;Pharyngeal residue - pyriform;Reduced laryngeal elevation Pharyngeal -- Pharyngeal- Thin Straw Delayed swallow initiation-pyriform sinuses;Pharyngeal residue - pyriform;Reduced laryngeal elevation Pharyngeal -- Pharyngeal- Puree Delayed swallow initiation-vallecula;Pharyngeal residue - pyriform;Reduced laryngeal elevation Pharyngeal -- Pharyngeal- Mechanical Soft NT Pharyngeal -- Pharyngeal- Regular Delayed swallow initiation-vallecula Pharyngeal -- Pharyngeal- Multi-consistency NT Pharyngeal -- Pharyngeal- Pill Delayed swallow initiation-vallecula Pharyngeal -- Pharyngeal Comment --  CHL IP CERVICAL ESOPHAGEAL PHASE 05/03/2015 Cervical Esophageal Phase WFL Pudding Teaspoon -- Pudding Cup -- Honey Teaspoon -- Honey Cup -- Nectar Teaspoon -- Nectar Cup -- Nectar Straw -- Thin Teaspoon -- Thin Cup -- Thin Straw -- Puree -- Mechanical Soft -- Regular -- Multi-consistency -- Pill -- Cervical Esophageal Comment -- [place] Siren 05/03/2015, 4:07 PM  Orbie Pyo Colvin Caroli.Ed CCC-SLP Pager [phone]               Assessment/Plan   ICD-9-CM ICD-10-CM   1. Glaucoma 365.9 H40.9   2. ESRD (end stage renal disease) on dialysis ([place]) 585.6 N18.6    V45.11 Z99.2   3. Uncontrolled type 2 diabetes mellitus with chronic kidney disease on chronic dialysis, with long-term current use of  insulin (HCC) 250.42 E11.22    585.9 E11.65    V45.11 N18.6    V58.67 Z99.2     Z79.4   4. Chronic combined systolic and diastolic heart failure (HCC) 428.42 I50.42   5. Essential hypertension 401.9 I10   6. Gout of multiple sites, unspecified cause, unspecified chronicity 274.9 M10.9   7. Gastroesophageal reflux disease without esophagitis 530.81 K21.9   8. Hyperlipidemia LDL goal <70 272.4 E78.5   9. Hereditary and idiopathic peripheral neuropathy 356.9 G60.9    10. PERIPHERAL VASCULAR DISEASE 443.9 I73.9   11. Chronic bronchitis, unspecified chronic bronchitis type ([place]) 491.9 J42   12. Dementia, without behavioral disturbance 294.20 F03.90     [facility] g[facility] to [facility] for glaucoma. She was taking this prior to SNF/hospital admission  Cont other meds as ordered  PT/OT/ST as ordered  HD as scheduled  Fluid restriction 1500 cc per day  Wound care as ordered  F/u with ophthamology, nephrology, cardiology and vascular sx as scheduled  GOAL: short term rehab and d/c home when medically appropriate. Communicated with pt and nursing.  Will follow  Fey Coghill S. Perlie Gold  [facility] [address] [place], [place] [address] [phone] Cell (Monday-Friday 8 AM - 5 PM) [phone] After 5 PM and follow prompts

## 2015-06-02 ENCOUNTER — Telehealth: Payer: Self-pay | Admitting: Endocrinology

## 2015-06-02 NOTE — Telephone Encounter (Signed)
LM for pt to call so we can get her AWV scheduled

## 2015-06-02 NOTE — Telephone Encounter (Signed)
-----   Message from [facility], [place] sent at 04/24/2015  3:44 PM EST ----- Good afternoon,   Pt was seen today but she is in need of an AWV. Let me know if she does end up getting one before the end 2016.   Sorry, I hate when that happens.

## 2015-06-14 ENCOUNTER — Ambulatory Visit (INDEPENDENT_AMBULATORY_CARE_PROVIDER_SITE_OTHER): Payer: Medicare Other | Admitting: Cardiology

## 2015-06-14 ENCOUNTER — Encounter: Payer: Self-pay | Admitting: Cardiology

## 2015-06-14 VITALS — BP 102/50 | HR 72 | Ht 66.0 in | Wt 183.6 lb

## 2015-06-14 DIAGNOSIS — I2510 Atherosclerotic heart disease of native coronary artery without angina pectoris: Secondary | ICD-10-CM | POA: Diagnosis not present

## 2015-06-14 DIAGNOSIS — I5043 Acute on chronic combined systolic (congestive) and diastolic (congestive) heart failure: Secondary | ICD-10-CM

## 2015-06-14 DIAGNOSIS — I110 Hypertensive heart disease with heart failure: Secondary | ICD-10-CM | POA: Insufficient documentation

## 2015-06-14 DIAGNOSIS — N185 Chronic kidney disease, stage 5: Secondary | ICD-10-CM | POA: Insufficient documentation

## 2015-06-14 DIAGNOSIS — I2583 Coronary atherosclerosis due to lipid rich plaque: Secondary | ICD-10-CM

## 2015-06-14 NOTE — Progress Notes (Signed)
Patient ID: [name], female   DOB: [birth year], 81 y.o.   MRN: [id number]    Cardiology Office Note Date:  06/14/2015  Patient ID:  [name], [name] 09/17/1929, MRN [id number] PCP:  Renato Shin, MD  Cardiologist:  Dr. Meda Coffee   Chief Complaint: f/u hospitalization for CHF  History of Present Illness: [name] is a 81 y.o. female with history of chronic combined CHF (EF 45-50% by [facility] 02/2015), CAD (NSTEMI 06/2014 with LHC showing moderate disease treated medically, recent cath 02/2015 with stable anatomy), DM, HTN, HLD, OSA (on CPAP), PVD s/p L BKA 08/2012, CKD stage III, COPD, bradycardia (not on BB due to this) who presents for post-hospital follow-up. Last echo 06/2014: EF 50-55%, no rWMA, grade 3 DD, mildly dilated LA.  She was admitted 9/19-9/22/16 with chest pain, SOB, left arm pain and right leg pain. She was admitted for [place] and had mild troponin elevation to 0.19. Cath 03/07/15 showed stable coronary angiography and elevated LVEDP - elevated troponin was felt more likely supply/demand in setting of CHF. Bidil was added in place of hydralazine, along with Ranexa. She also received IV Lasix post-cath. She had AKI on CKD with elevation of Cr to 1.83 and was kept in the hospital for further monitoring. At discharge her Cr was 1.94.   She comes in to clinic today doing well. She is accompanied by her husband. She was hospitalized in Npovember with acute on chronic CKD and started on HD. She is currently having it done via perm cath, s/p fistula surgery, not mature yet. Her weight 183 lbs, she has PND about 1x/week, some LE edema, no DOE or CP> No bleeding, no palpitations or syncope.   Past Medical History  Diagnosis Date  . Uterine cancer ([place])   . Diabetes mellitus     type II; peripheral neuropathy  . Hypertension   . GERD (gastroesophageal reflux disease)   . Peripheral vascular disease ([place])     a. s/p L BKA 08/2012.  . Obesity   . Dyslipidemia   .  Chronic combined systolic and diastolic CHF (congestive heart failure) (HCC)     a. EF 50-55%, mild LVH and grade 1 diast. Dysfxn b. Grade 3 Diastolic Dysfunction [id number]. b. 02/2015: EF 45-50% by cath.  . Osteoarthritis cervical spine   . DM retinopathy ([place])   . Sleep apnea     with CPAP  . History of shingles   . COPD (chronic obstructive pulmonary disease) ([place])   . Depression   . Peptic ulcer disease     duodenal  . Peptic stricture of esophagus   . Hiatal hernia   . Diverticulosis   . Arthritis   . CAD (coronary artery disease)     a. s/p multiple caths with nonobs CAD;   b. cath 1/10: pLAD 20%, mLAD 40%, pCFX 20%, mCFX 40%, pRCA 60-70%;   c.  Myoview 06/02/12: Low anterior wall scar, no ischemia, EF 37%. d. cath 06/20/2014 70% mid LCx, medical therapy, high risk for PCI due to close proximity to large OM e. cath 03/07/15 pro RCA 50% & pro to mid Cx 75%, both stable, LVEDP 33-19mm Hg-> medical management  . Cardiomyopathy ([place])     a. Echo 05/31/12: EF 40-45%. b. Echo 06/2014: EF 50-55%. c. Cath 02/2015: EF 45-50%.  . Asthma     Mild  . Pruritic condition     Idiopathic  . Zoster   . Noncompliance   . CKD (chronic kidney disease),  stage III     Dr. Lorrene Reid  . Bradycardia     a. Not on BB due to this.  . Shortness of breath dyspnea     Past Surgical History  Procedure Laterality Date  . Tubal ligation  1967  . Knee arthroscopy  10/1998    Left  . Craniotomy  1997    Left for SDH  . Cataract extraction, bilateral  2005  . Hernia repair    . Esophagogastroduodenoscopy  04/04/2004  . Spine surgery      C-spine and lumbar surgery  . Cholecystectomy  2010  . Cardiac catheterization    . Dexa  7/05  . Amputation Left 09/04/2012    Procedure: AMPUTATION BELOW KNEE;  Surgeon: Angelia Mould, MD;  Location: [place];  Service: Vascular;  Laterality: Left;  . Abdominal angiogram N/A 08/31/2012    Procedure: ABDOMINAL ANGIOGRAM with runoff poss intervention;  Surgeon:  Angelia Mould, MD;  Location: [facility] CATH LAB;  Service: Cardiovascular;  Laterality: N/A;  . Tubal ligation    . Left heart catheterization with coronary angiogram N/A 06/22/2014    Procedure: LEFT HEART CATHETERIZATION WITH CORONARY ANGIOGRAM;  Surgeon: Burnell Blanks, MD;  Location: [facility] CATH LAB;  Service: Cardiovascular;  Laterality: N/A;  . Eye surgery    . Brain surgery      1997 blood clot on the brain, then had to relieve fluid on the brain  . Multiple extractions with alveoloplasty N/A 08/01/2014    Procedure: EXTRACTIONS OF TEETH NUMBERS 7 8 9 10 11  AND 19 AND ALVEOLOPLASTY UPPER LEFT AND RIGHT QUADRANT;  Surgeon: Isac Caddy, DDS;  Location: [place];  Service: Oral Surgery;  Laterality: N/A;  . Cardiac catheterization N/A 03/07/2015    Procedure: Left Heart Cath and Coronary Angiography;  Surgeon: Leonie Man, MD;  Location: [place] CV LAB;  Service: Cardiovascular;  Laterality: N/A;  . Av fistula placement Left 05/10/2015    Procedure: CREATION OF LEFT UPPER ARM ARTERIOVENOUS (AV) FISTULA ;  Surgeon: Elam Dutch, MD;  [facility] OR;  Service: Vascular;  Laterality: Left;    Current Outpatient Prescriptions  Medication Sig Dispense Refill  . acetaminophen (TYLENOL) 500 MG tablet Take 500 mg by mouth every 6 (six) hours as needed for mild pain.    Marland Kitchen albuterol (PROVENTIL HFA;VENTOLIN HFA) 108 (90 BASE) MCG/ACT inhaler Inhale 2 puffs into the lungs every 4 (four) hours as needed for wheezing or shortness of breath. 1 Inhaler 3  . albuterol (PROVENTIL) (2.5 MG/3ML) 0.083% nebulizer solution Take 3 mLs (2.5 mg total) by nebulization every 4 (four) hours as needed for wheezing or shortness of breath. 75 mL 12  . allopurinol (ZYLOPRIM) 100 MG tablet TAKE 1 TABLET BY MOUTH EVERY DAY 30 tablet 0  . amLODipine (NORVASC) 10 MG tablet TAKE 1 TABLET BY MOUTH DAILY 30 tablet 0  . aspirin EC 81 MG tablet Take 1 tablet (81 mg total) by mouth daily. 90 tablet 3  .  atorvastatin (LIPITOR) 40 MG tablet TAKE 1 TABLET BY MOUTH DAILY AT 6 PM 30 tablet 0  . calcitRIOL (ROCALTROL) 0.25 MCG capsule TAKE ONE CAPSULE BY MOUTH EVERY DAY 30 capsule 0  . clopidogrel (PLAVIX) 75 MG tablet Take 75 mg by mouth daily.    Marland Kitchen donepezil (ARICEPT) 5 MG tablet Take 1 tablet (5 mg total) by mouth at bedtime. 30 tablet 11  . feeding supplement (GLUCERNA SHAKE) LIQD Take 237 mLs by mouth every morning.     Marland Kitchen  Fluticasone-Salmeterol (ADVAIR DISKUS) 100-50 MCG/DOSE AEPB Inhale 1 puff into the lungs 2 (two) times daily. 1 each 11  . furosemide (LASIX) 40 MG tablet Take 40 mg by mouth daily.  11  . gabapentin (NEURONTIN) 100 MG capsule Take 1 capsule (100 mg total) by mouth at bedtime. 30 capsule 0  . hydrALAZINE (APRESOLINE) 25 MG tablet TAKE 1 TABLET BY MOUTH THREE TIMES DAILY 90 tablet 0  . HYDROcodone-acetaminophen (NORCO) 10-325 MG tablet Take 1 tablet by mouth every 6 (six) hours as needed for moderate pain. 30 tablet 0  . insulin NPH-regular Human (NOVOLIN 70/30) (70-30) 100 UNIT/ML injection Inject 14 Units into the skin daily with breakfast. Sliding scale. Only takes in the morning 10 mL 11  . isosorbide mononitrate (IMDUR) 60 MG 24 hr tablet Take 60 mg by mouth daily.  5  . latanoprost (XALATAN) 0.005 % ophthalmic solution Place 1 drop into both eyes at bedtime.    . nitroGLYCERIN (NITROSTAT) 0.4 MG SL tablet Place 0.4 mg under the tongue every 5 (five) minutes as needed for chest pain.     . ONE TOUCH ULTRA TEST test strip USE TO TEST BLOOD SUGAR TWICE DAILY. 100 each 3  . pantoprazole (PROTONIX) 40 MG tablet TAKE 1 TABLET BY MOUTH TWICE DAILY 60 tablet 0  . ranolazine (RANEXA) 500 MG 12 hr tablet Take 1 tablet (500 mg total) by mouth 2 (two) times daily. 60 tablet 3  . sertraline (ZOLOFT) 100 MG tablet TAKE 1 TABLET BY MOUTH EVERY DAY 30 tablet 0  . ZETIA 10 MG tablet TAKE 1 TABLET BY MOUTH EVERY MORNING 30 tablet 0   No current facility-administered medications for this  visit.    Allergies:   Iohexol   Social History:  The patient  reports that she has never smoked. She has never used smokeless tobacco. She reports that she does not drink alcohol or use illicit drugs.   Family History:  The patient's family history includes Asthma in her other; Cancer - Other in her mother; Diabetes in her mother and sister; Heart disease in her mother; Hypertension in her father and mother; Kidney disease in her paternal grandmother; Lymphoma in her father; Rheum arthritis in her father and mother; Stomach cancer in her mother; Stroke in her paternal grandmother. There is no history of Heart attack.*  ROS:  Please see the history of present illness.    All other systems are reviewed and otherwise negative.   PHYSICAL EXAM:  VS:  BP 102/50 mmHg  Pulse 72  Ht 5\' 6"  (1.676 m)  Wt 183 lb 9.6 oz (83.28 kg)  BMI 29.65 kg/m2 BMI: Body mass index is 29.65 kg/(m^2). Well nourished obese AAF, in no acute distress HEENT: normocephalic, atraumatic Neck: no JVD, carotid bruits or masses Cardiac:  normal S1, S2; RRR; no murmurs, rubs, or gallops Lungs:  clear to auscultation bilaterally, no wheezing, rhonchi or rales Abd: soft, nontender, no hepatomegaly, + BS MS: no deformity or atrophy Ext: mild B/L LE edema, right radial cath site with resolving ecchymosis, no hematoma, good pulse Skin: warm and dry, no rash Neuro:  moves all extremities spontaneously, no focal abnormalities noted, follows commands Psych: euthymic mood, full affect   EKG:  Done today shows NSR 71bpm, lateral infarct age undetermined, nonspecific ST-T changes, QTc 449ms, no acute change from prior.  Recent Labs: 08/18/2014: TSH 1.30 04/24/2015: ALT 32; B Natriuretic Peptide 1594.7* 04/25/2015: Magnesium 1.9 05/15/2015: BUN 76*; Creatinine, Ser 3.96*; Hemoglobin 10.8*; Platelets 252; Potassium  4.2; Sodium 136  08/18/2014: Cholesterol 118; HDL 68.30; LDL Cholesterol 31; Total CHOL/HDL Ratio 2; Triglycerides 96.0;  VLDL 19.2   CrCl cannot be calculated (Patient has no serum creatinine result on file.).   Wt Readings from Last 3 Encounters:  06/14/15 183 lb 9.6 oz (83.28 kg)  05/22/15 171 lb 12.8 oz (77.928 kg)  05/15/15 166 lb 10.7 oz (75.6 kg)     Other studies reviewed: Additional studies/records reviewed today include: summarized above  ASSESSMENT AND PLAN:  1. Acute on chronic combined CHF - appears mildly fluid overloaded, avoid diuretics, advised to get to dry weight of 180 lbs with HD (currently 184 lbs).  2. CKD stage V - ESRD on HD. 3. CAD as above - QTC stable on Ranexa. No recurrent angina. Continue BABY ASPIRIN and Plavix. 4. Essential HTN - controlled. 5. H/o sinus bradycardia - stable. Not on BB due to this.  Follow up in 3 months.  [name]  06/14/2015 10:53 AM     [address] [place] [address] [phone] (office)  [phone] (fax)

## 2015-06-14 NOTE — Patient Instructions (Signed)
Your physician recommends that you continue on your current medications as directed. Please refer to the Current Medication list given to you today.    Your physician recommends that you schedule a follow-up appointment in:   [place]

## 2015-06-16 ENCOUNTER — Telehealth: Payer: Self-pay | Admitting: Endocrinology

## 2015-06-16 DIAGNOSIS — E1142 Type 2 diabetes mellitus with diabetic polyneuropathy: Secondary | ICD-10-CM | POA: Diagnosis not present

## 2015-06-16 DIAGNOSIS — M4302 Spondylolysis, cervical region: Secondary | ICD-10-CM | POA: Diagnosis not present

## 2015-06-16 DIAGNOSIS — M6281 Muscle weakness (generalized): Secondary | ICD-10-CM | POA: Diagnosis not present

## 2015-06-16 DIAGNOSIS — I132 Hypertensive heart and chronic kidney disease with heart failure and with stage 5 chronic kidney disease, or end stage renal disease: Secondary | ICD-10-CM | POA: Diagnosis not present

## 2015-06-16 NOTE — Telephone Encounter (Signed)
Randall Hiss PT stated patient need a verbal order for physical Therapy 2 week for 4 weeks [phone]

## 2015-06-20 NOTE — Telephone Encounter (Signed)
See note below and please advise, Thanks! 

## 2015-06-20 NOTE — Telephone Encounter (Signed)
ok 

## 2015-06-20 NOTE — Telephone Encounter (Signed)
I contacted Physical Therapist Garnet Koyanagi and gave verbal ok.

## 2015-06-21 ENCOUNTER — Ambulatory Visit (INDEPENDENT_AMBULATORY_CARE_PROVIDER_SITE_OTHER): Payer: Medicare Other | Admitting: Endocrinology

## 2015-06-21 VITALS — BP 130/58 | HR 75 | Temp 97.7°F | Ht 66.0 in | Wt 179.0 lb

## 2015-06-21 DIAGNOSIS — E1121 Type 2 diabetes mellitus with diabetic nephropathy: Secondary | ICD-10-CM | POA: Diagnosis not present

## 2015-06-21 DIAGNOSIS — E1165 Type 2 diabetes mellitus with hyperglycemia: Secondary | ICD-10-CM

## 2015-06-21 LAB — POCT GLYCOSYLATED HEMOGLOBIN (HGB A1C): Hemoglobin A1C: 7.7

## 2015-06-21 MED ORDER — INSULIN NPH ISOPHANE & REGULAR (70-30) 100 UNIT/ML ~~LOC~~ SUSP: 15.0000 [IU] | Freq: Every day | SUBCUTANEOUS | Status: DC

## 2015-06-21 NOTE — Progress Notes (Signed)
Subjective:    Patient ID: [name], female    DOB: 1929-09-11, 82 y.o.   MRN: [id number]  HPI Pt returns for f/u of diabetes mellitus: DM type: Insulin-requiring type 2 Dx'ed: [id number] Complications: polyneuropathy, nephropathy, CAD, and PAD.  Therapy: insulin since 1991 GDM: never DKA: never Severe hypoglycemia: last episode was in 2014 Pancreatitis: never Other: therapy is limited by pt's request for least expensive insulin, and by her need for a simple regimen; husband administers insulin to her. Interval history:  She takes 19 units qam.  i asked husband, who says he skips the insulin approx once per week, due to mild hypoglycemia in am. husb also says pressure sore is improved. Chronic arthralgias: she had to reduce gabapentin due to ESRD.  She now seldom needs vicodin. Past Medical History  Diagnosis Date  . Uterine cancer ([place])   . Diabetes mellitus     type II; peripheral neuropathy  . Hypertension   . GERD (gastroesophageal reflux disease)   . Peripheral vascular disease ([place])     a. s/p L BKA 08/2012.  . Obesity   . Dyslipidemia   . Chronic combined systolic and diastolic CHF (congestive heart failure) (HCC)     a. EF 50-55%, mild LVH and grade 1 diast. Dysfxn b. Grade 3 Diastolic Dysfunction [id number]. b. 02/2015: EF 45-50% by cath.  . Osteoarthritis cervical spine   . DM retinopathy ([place])   . Sleep apnea     with CPAP  . History of shingles   . COPD (chronic obstructive pulmonary disease) ([place])   . Depression   . Peptic ulcer disease     duodenal  . Peptic stricture of esophagus   . Hiatal hernia   . Diverticulosis   . Arthritis   . CAD (coronary artery disease)     a. s/p multiple caths with nonobs CAD;   b. cath 1/10: pLAD 20%, mLAD 40%, pCFX 20%, mCFX 40%, pRCA 60-70%;   c.  Myoview 06/02/12: Low anterior wall scar, no ischemia, EF 37%. d. cath 06/20/2014 70% mid LCx, medical therapy, high risk for PCI due to close proximity to large OM e. cath  03/07/15 pro RCA 50% & pro to mid Cx 75%, both stable, LVEDP 33-43mm Hg-> medical management  . Cardiomyopathy ([place])     a. Echo 05/31/12: EF 40-45%. b. Echo 06/2014: EF 50-55%. c. Cath 02/2015: EF 45-50%.  . Asthma     Mild  . Pruritic condition     Idiopathic  . Zoster   . Noncompliance   . CKD (chronic kidney disease), stage III     Dr. Lorrene Reid  . Bradycardia     a. Not on BB due to this.  . Shortness of breath dyspnea     Past Surgical History  Procedure Laterality Date  . Tubal ligation  1967  . Knee arthroscopy  10/1998    Left  . Craniotomy  1997    Left for SDH  . Cataract extraction, bilateral  2005  . Hernia repair    . Esophagogastroduodenoscopy  04/04/2004  . Spine surgery      C-spine and lumbar surgery  . Cholecystectomy  2010  . Cardiac catheterization    . Dexa  7/05  . Amputation Left 09/04/2012    Procedure: AMPUTATION BELOW KNEE;  Surgeon: Angelia Mould, MD;  Location: [place];  Service: Vascular;  Laterality: Left;  . Abdominal angiogram N/A 08/31/2012    Procedure: ABDOMINAL ANGIOGRAM with runoff poss intervention;  Surgeon: Angelia Mould, MD;  [facility] CATH LAB;  Service: Cardiovascular;  Laterality: N/A;  . Tubal ligation    . Left heart catheterization with coronary angiogram N/A 06/22/2014    Procedure: LEFT HEART CATHETERIZATION WITH CORONARY ANGIOGRAM;  Surgeon: Burnell Blanks, MD;  Location: [facility] CATH LAB;  Service: Cardiovascular;  Laterality: N/A;  . Eye surgery    . Brain surgery      1997 blood clot on the brain, then had to relieve fluid on the brain  . Multiple extractions with alveoloplasty N/A 08/01/2014    Procedure: EXTRACTIONS OF TEETH NUMBERS 7 8 9 10 11  AND 19 AND ALVEOLOPLASTY UPPER LEFT AND RIGHT QUADRANT;  Surgeon: Isac Caddy, DDS;  Location: [place];  Service: Oral Surgery;  Laterality: N/A;  . Cardiac catheterization N/A 03/07/2015    Procedure: Left Heart Cath and Coronary Angiography;  Surgeon: Leonie Man, MD;  Location: [place] CV LAB;  Service: Cardiovascular;  Laterality: N/A;  . Av fistula placement Left 05/10/2015    Procedure: CREATION OF LEFT UPPER ARM ARTERIOVENOUS (AV) FISTULA ;  Surgeon: Elam Dutch, MD;  [facility] OR;  Service: Vascular;  Laterality: Left;    Social History   Social History  . Marital Status: Married    Spouse Name: N/A  . Number of Children: 7  . Years of Education: N/A   Occupational History  . Retired    Social History Main Topics  . Smoking status: Never Smoker   . Smokeless tobacco: Never Used  . Alcohol Use: No     Comment: rare  . Drug Use: No  . Sexual Activity: No   Other Topics Concern  . Not on file   Social History Narrative    Current Outpatient Prescriptions on File Prior to Visit  Medication Sig Dispense Refill  . acetaminophen (TYLENOL) 500 MG tablet Take 500 mg by mouth every 6 (six) hours as needed for mild pain.    Marland Kitchen albuterol (PROVENTIL HFA;VENTOLIN HFA) 108 (90 BASE) MCG/ACT inhaler Inhale 2 puffs into the lungs every 4 (four) hours as needed for wheezing or shortness of breath. 1 Inhaler 3  . albuterol (PROVENTIL) (2.5 MG/3ML) 0.083% nebulizer solution Take 3 mLs (2.5 mg total) by nebulization every 4 (four) hours as needed for wheezing or shortness of breath. 75 mL 12  . allopurinol (ZYLOPRIM) 100 MG tablet TAKE 1 TABLET BY MOUTH EVERY DAY 30 tablet 0  . amLODipine (NORVASC) 10 MG tablet TAKE 1 TABLET BY MOUTH DAILY 30 tablet 0  . aspirin EC 81 MG tablet Take 1 tablet (81 mg total) by mouth daily. 90 tablet 3  . atorvastatin (LIPITOR) 40 MG tablet TAKE 1 TABLET BY MOUTH DAILY AT 6 PM 30 tablet 0  . calcitRIOL (ROCALTROL) 0.25 MCG capsule TAKE ONE CAPSULE BY MOUTH EVERY DAY 30 capsule 0  . clopidogrel (PLAVIX) 75 MG tablet Take 75 mg by mouth daily.    Marland Kitchen donepezil (ARICEPT) 5 MG tablet Take 1 tablet (5 mg total) by mouth at bedtime. 30 tablet 11  . feeding supplement (GLUCERNA SHAKE) LIQD Take 237 mLs by  mouth every morning.     . Fluticasone-Salmeterol (ADVAIR DISKUS) 100-50 MCG/DOSE AEPB Inhale 1 puff into the lungs 2 (two) times daily. 1 each 11  . furosemide (LASIX) 40 MG tablet Take 40 mg by mouth daily.  11  . gabapentin (NEURONTIN) 100 MG capsule Take 1 capsule (100 mg total) by mouth at bedtime. 30 capsule  0  . hydrALAZINE (APRESOLINE) 25 MG tablet TAKE 1 TABLET BY MOUTH THREE TIMES DAILY 90 tablet 0  . HYDROcodone-acetaminophen (NORCO) 10-325 MG tablet Take 1 tablet by mouth every 6 (six) hours as needed for moderate pain. 30 tablet 0  . isosorbide mononitrate (IMDUR) 60 MG 24 hr tablet Take 60 mg by mouth daily.  5  . latanoprost (XALATAN) 0.005 % ophthalmic solution Place 1 drop into both eyes at bedtime.    . nitroGLYCERIN (NITROSTAT) 0.4 MG SL tablet Place 0.4 mg under the tongue every 5 (five) minutes as needed for chest pain.     . ONE TOUCH ULTRA TEST test strip USE TO TEST BLOOD SUGAR TWICE DAILY. 100 each 3  . pantoprazole (PROTONIX) 40 MG tablet TAKE 1 TABLET BY MOUTH TWICE DAILY 60 tablet 0  . ranolazine (RANEXA) 500 MG 12 hr tablet Take 1 tablet (500 mg total) by mouth 2 (two) times daily. 60 tablet 3  . sertraline (ZOLOFT) 100 MG tablet TAKE 1 TABLET BY MOUTH EVERY DAY 30 tablet 0  . ZETIA 10 MG tablet TAKE 1 TABLET BY MOUTH EVERY MORNING 30 tablet 0   No current facility-administered medications on file prior to visit.    Allergies  Allergen Reactions  . Iohexol Itching and Rash     Code: RASH, Desc: [place] ON PT'S CHART ALLERGIC TO IV DYE 09/04/07/RM, Onset Date: [id number]     Family History  Problem Relation Age of Onset  . Cancer - Other Mother     "Stomach" Cancer  . Diabetes Mother   . Heart disease Mother   . Stomach cancer Mother   . Hypertension Mother   . Lymphoma Father   . Hypertension Father   . Kidney disease Paternal Grandmother   . Asthma Other   . Diabetes Sister   . Rheum arthritis Mother   . Rheum arthritis Father   . Heart  attack Neg Hx   . Stroke Paternal Grandmother     BP 130/58 mmHg  Pulse 75  Temp(Src) 97.7 F (36.5 C) (Oral)  Ht 5\' 6"  (1.676 m)  Wt 179 lb (81.194 kg)  BMI 28.91 kg/m2  SpO2 94%  Review of Systems Denies LOC    Objective:   Physical Exam VITAL SIGNS:  See vs page GENERAL: no distress.  In wheelchair Skin: minimal (stage 1) breakdown on the right buttock (only 1 cm).  Joints of UE's: no swelling/tend warmth/erythema LLE: BKA.   Right foot: no deformity.  There is bilateral onychomycosis.  Skin: no ulcer, and normal color and temp, on the right foot.  Neuro: sensation is intact to touch on the right foot.   CV: 1+ right leg edema.   A1c=7.7%    Assessment & Plan:  DM: overcontrolled, given poor prognosis and this regimen, which does match insulin to her changing needs throughout the day Skin breakdown: new to me.  Improved per husband Chronic pain syndrome: well-controlled n minimal vicodin.  Patient is advised the following: Patient Instructions  Please reduce the insulin to 15 units each morning, and take it no matter what your blood sugar is. check your blood sugar twice a day.  vary the time of day when you check, between before the 3 meals, and at bedtime.  also check if you have symptoms of your blood sugar being too high or too low.  please keep a record of the readings and bring it to your next appointment here (or you can bring the meter  itself).  You can write it on any piece of paper.  please call us sooner if your blood sugar goes below 70, or if you have a lot of readings over 200.  Please continue the same medications for pain. Please continue the same treatment for the pressure sore. Please come back for a follow-up appointment in 2 months.

## 2015-06-21 NOTE — Patient Instructions (Addendum)
Please reduce the insulin to 15 units each morning, and take it no matter what your blood sugar is. check your blood sugar twice a day.  vary the time of day when you check, between before the 3 meals, and at bedtime.  also check if you have symptoms of your blood sugar being too high or too low.  please keep a record of the readings and bring it to your next appointment here (or you can bring the meter itself).  You can write it on any piece of paper.  please call us sooner if your blood sugar goes below 70, or if you have a lot of readings over 200.  Please continue the same medications for pain. Please continue the same treatment for the pressure sore. Please come back for a follow-up appointment in 2 months.

## 2015-06-23 ENCOUNTER — Telehealth: Payer: Self-pay | Admitting: Endocrinology

## 2015-06-23 ENCOUNTER — Encounter: Payer: Self-pay | Admitting: Vascular Surgery

## 2015-06-23 NOTE — Telephone Encounter (Signed)
Left a voicemail advising of note below. Requested a call back if the pt would like to discus.

## 2015-06-23 NOTE — Telephone Encounter (Addendum)
See note below and please advise. Thanks! 

## 2015-06-23 NOTE — Telephone Encounter (Signed)
ok 

## 2015-06-23 NOTE — Telephone Encounter (Signed)
Izora Gala OT from [place] need a verbal order for patient 2 x a week for 4 weeks, [phone]

## 2015-06-26 ENCOUNTER — Ambulatory Visit (HOSPITAL_COMMUNITY)
Admission: RE | Admit: 2015-06-26 | Discharge: 2015-06-26 | Disposition: A | Discharge status: Home / Self Care | Payer: Medicare Other | Source: Ambulatory Visit | Attending: Vascular Surgery | Admitting: Vascular Surgery

## 2015-06-26 DIAGNOSIS — I120 Hypertensive chronic kidney disease with stage 5 chronic kidney disease or end stage renal disease: Secondary | ICD-10-CM | POA: Insufficient documentation

## 2015-06-26 DIAGNOSIS — E1122 Type 2 diabetes mellitus with diabetic chronic kidney disease: Secondary | ICD-10-CM | POA: Insufficient documentation

## 2015-06-26 DIAGNOSIS — E785 Hyperlipidemia, unspecified: Secondary | ICD-10-CM | POA: Diagnosis not present

## 2015-06-26 DIAGNOSIS — N186 End stage renal disease: Secondary | ICD-10-CM | POA: Insufficient documentation

## 2015-06-26 DIAGNOSIS — Z4931 Encounter for adequacy testing for hemodialysis: Secondary | ICD-10-CM | POA: Diagnosis not present

## 2015-06-28 ENCOUNTER — Encounter: Payer: Self-pay | Admitting: Vascular Surgery

## 2015-06-28 ENCOUNTER — Ambulatory Visit (INDEPENDENT_AMBULATORY_CARE_PROVIDER_SITE_OTHER): Payer: Medicare Other | Admitting: Vascular Surgery

## 2015-06-28 VITALS — BP 127/55 | HR 68 | Temp 98.0°F | Resp 20 | Ht 66.0 in | Wt 178.3 lb

## 2015-06-28 DIAGNOSIS — Z48812 Encounter for surgical aftercare following surgery on the circulatory system: Secondary | ICD-10-CM | POA: Insufficient documentation

## 2015-06-28 DIAGNOSIS — N186 End stage renal disease: Secondary | ICD-10-CM

## 2015-06-28 NOTE — Progress Notes (Signed)
Patient is an 82 year old female who is status post placement of a left brachiocephalic AV fistula on [id number]. She returns today for further follow-up. She is currently dialyzing via a catheter. She denies any numbness or tingling in her left hand.  Physical exam:  Filed Vitals:   06/28/15 1248  BP: 127/55  Pulse: 68  Temp: 98 F (36.7 C)  TempSrc: Oral  Resp: 20  Height: 5\' 6"  (1.676 m)  Weight: 178 lb 4.8 oz (80.876 kg)    Left upper extremity: Well-healed antecubital incision palpable thrill throughout the course of the fistula.  Data: She had a duplex ultrasound of her fistula on 06/26/2015. Fistula diameter is 6-7 mm. It is 5-6 mm from the skin surface.  Assessment: Maturing fistula left upper extremity  Plan: Fistula should be ready for cannulation approximate 6 weeks. Patient will follow-up on as-needed basis.  Ruta Hinds, MD [facility] Office: [phone] Pager: [phone]

## 2015-06-29 ENCOUNTER — Encounter: Payer: Medicare Other | Admitting: Vascular Surgery

## 2015-06-29 ENCOUNTER — Encounter (HOSPITAL_COMMUNITY): Payer: Medicare Other

## 2015-07-14 ENCOUNTER — Other Ambulatory Visit: Payer: Self-pay | Admitting: Endocrinology

## 2015-07-15 ENCOUNTER — Other Ambulatory Visit: Payer: Self-pay | Admitting: Physician Assistant

## 2015-07-17 ENCOUNTER — Telehealth: Payer: Self-pay | Admitting: Endocrinology

## 2015-07-17 NOTE — Telephone Encounter (Signed)
Dawn, Did Cindee Salt leave a call back number? Thanks!

## 2015-07-17 NOTE — Telephone Encounter (Signed)
Please review for refill, Thank you. 

## 2015-07-17 NOTE — Telephone Encounter (Signed)
ok 

## 2015-07-17 NOTE — Telephone Encounter (Signed)
See note below and please advise, Thanks! 

## 2015-07-17 NOTE — Telephone Encounter (Signed)
Yes!!!! I am so sorry   [phone]

## 2015-07-17 NOTE — Telephone Encounter (Signed)
PT [name] advised of verbal orders.

## 2015-07-17 NOTE — Telephone Encounter (Signed)
[name] called requesting a verbal order   Order: Home visits twice week for x 4 weeks   Please advise    Thank you

## 2015-07-17 NOTE — Telephone Encounter (Signed)
[name] with [facility]  OT is reqesting extension for 2 times a week for 1 wk and 1 time a wk for 1 wk  # [phone]

## 2015-07-17 NOTE — Telephone Encounter (Signed)
Verbal orders given to [place] with [facility].

## 2015-07-20 ENCOUNTER — Ambulatory Visit: Payer: Medicare Other | Admitting: Endocrinology

## 2015-07-21 ENCOUNTER — Other Ambulatory Visit: Payer: Self-pay | Admitting: Internal Medicine

## 2015-07-21 ENCOUNTER — Telehealth: Payer: Self-pay | Admitting: Endocrinology

## 2015-07-21 ENCOUNTER — Ambulatory Visit (INDEPENDENT_AMBULATORY_CARE_PROVIDER_SITE_OTHER): Payer: Medicare Other | Admitting: Endocrinology

## 2015-07-21 ENCOUNTER — Encounter: Payer: Self-pay | Admitting: Endocrinology

## 2015-07-21 VITALS — BP 126/64 | HR 75 | Temp 98.0°F | Ht 66.0 in | Wt 182.0 lb

## 2015-07-21 DIAGNOSIS — E1121 Type 2 diabetes mellitus with diabetic nephropathy: Secondary | ICD-10-CM | POA: Diagnosis not present

## 2015-07-21 DIAGNOSIS — E1165 Type 2 diabetes mellitus with hyperglycemia: Secondary | ICD-10-CM

## 2015-07-21 MED ORDER — COLESTIPOL HCL 1 G PO TABS: 3.0000 g | ORAL_TABLET | Freq: Every day | ORAL | Status: DC

## 2015-07-21 MED ORDER — CALCITRIOL 0.25 MCG PO CAPS: 0.2500 ug | ORAL_CAPSULE | Freq: Every day | ORAL | Status: DC

## 2015-07-21 MED ORDER — CEPHALEXIN 500 MG PO TABS: 500.0000 mg | ORAL_TABLET | Freq: Two times a day (BID) | ORAL | Status: DC

## 2015-07-21 MED ORDER — ALLOPURINOL 100 MG PO TABS: 100.0000 mg | ORAL_TABLET | Freq: Every day | ORAL | Status: DC

## 2015-07-21 MED ORDER — PANTOPRAZOLE SODIUM 40 MG PO TBEC: 40.0000 mg | DELAYED_RELEASE_TABLET | Freq: Two times a day (BID) | ORAL | Status: DC

## 2015-07-21 MED ORDER — DONEPEZIL HCL 5 MG PO TABS: 5.0000 mg | ORAL_TABLET | Freq: Every day | ORAL | Status: DC

## 2015-07-21 MED ORDER — HYDRALAZINE HCL 25 MG PO TABS: 25.0000 mg | ORAL_TABLET | Freq: Three times a day (TID) | ORAL | Status: DC

## 2015-07-21 MED ORDER — ATORVASTATIN CALCIUM 40 MG PO TABS: ORAL_TABLET | ORAL | Status: DC

## 2015-07-21 MED ORDER — SERTRALINE HCL 100 MG PO TABS: 100.0000 mg | ORAL_TABLET | Freq: Every day | ORAL | Status: DC

## 2015-07-21 MED ORDER — AMLODIPINE BESYLATE 10 MG PO TABS: 10.0000 mg | ORAL_TABLET | Freq: Every day | ORAL | Status: DC

## 2015-07-21 NOTE — Patient Instructions (Addendum)
Please reduce the insulin to 14 units each morning, and take it no matter what your blood sugar is. check your blood sugar twice a day.  vary the time of day when you check, between before the 3 meals, and at bedtime.  also check if you have symptoms of your blood sugar being too high or too low.  please keep a record of the readings and bring it to your next appointment here (or you can bring the meter itself).  You can write it on any piece of paper.  please call us sooner if your blood sugar goes below 70, or if you have a lot of readings over 200.  Please come back for a follow-up appointment in 2 months.   i have sent a prescription to your pharmacy, to change zetia to "colestipol."  You should take this apart from other meds, as it likes to absorb many other meds.  i have sent a prescription to your pharmacy, for an antibiotic.  Please call if this does not help, and I'll refer you to a foot specialist.

## 2015-07-21 NOTE — Telephone Encounter (Signed)
Rx submitted per pt's request.  

## 2015-07-21 NOTE — Progress Notes (Signed)
Subjective:    Patient ID: [name], female    DOB: 1930/05/05, 82 y.o.   MRN: [id number]  HPI  The state of at least three ongoing medical problems is addressed today, with interval history of each noted here: Pt returns for f/u of diabetes mellitus: DM type: Insulin-requiring type 2 Dx'ed: [id number] Complications: polyneuropathy, nephropathy, CAD, and PAD.  Therapy: insulin since 1991 GDM: never DKA: never Severe hypoglycemia: last episode was in 2014 Pancreatitis: never Other: therapy is limited by pt's request for least expensive insulin, and by her need for a simple regimen; husband administers insulin to her. Interval history:  She takes 15 units qam.  i asked husband, who says she has mild hypoglycemia approx once per week.   husb also says right heel pain persists i asked husband, who says pt cannot afford zetia, but she tolerates well. Past Medical History  Diagnosis Date  . Uterine cancer ([place])   . Diabetes mellitus     type II; peripheral neuropathy  . Hypertension   . GERD (gastroesophageal reflux disease)   . Peripheral vascular disease ([place])     a. s/p L BKA 08/2012.  . Obesity   . Dyslipidemia   . Chronic combined systolic and diastolic CHF (congestive heart failure) (HCC)     a. EF 50-55%, mild LVH and grade 1 diast. Dysfxn b. Grade 3 Diastolic Dysfunction [id number]. b. 02/2015: EF 45-50% by cath.  . Osteoarthritis cervical spine   . DM retinopathy ([place])   . Sleep apnea     with CPAP  . History of shingles   . COPD (chronic obstructive pulmonary disease) ([place])   . Depression   . Peptic ulcer disease     duodenal  . Peptic stricture of esophagus   . Hiatal hernia   . Diverticulosis   . Arthritis   . CAD (coronary artery disease)     a. s/p multiple caths with nonobs CAD;   b. cath 1/10: pLAD 20%, mLAD 40%, pCFX 20%, mCFX 40%, pRCA 60-70%;   c.  Myoview 06/02/12: Low anterior wall scar, no ischemia, EF 37%. d. cath 06/20/2014 70% mid LCx, medical  therapy, high risk for PCI due to close proximity to large OM e. cath 03/07/15 pro RCA 50% & pro to mid Cx 75%, both stable, LVEDP 33-73mm Hg-> medical management  . Cardiomyopathy ([place])     a. Echo 05/31/12: EF 40-45%. b. Echo 06/2014: EF 50-55%. c. Cath 02/2015: EF 45-50%.  . Asthma     Mild  . Pruritic condition     Idiopathic  . Zoster   . Noncompliance   . CKD (chronic kidney disease), stage III     Dr. Lorrene Reid  . Bradycardia     a. Not on BB due to this.  . Shortness of breath dyspnea     Past Surgical History  Procedure Laterality Date  . Tubal ligation  1967  . Knee arthroscopy  10/1998    Left  . Craniotomy  1997    Left for SDH  . Cataract extraction, bilateral  2005  . Hernia repair    . Esophagogastroduodenoscopy  04/04/2004  . Spine surgery      C-spine and lumbar surgery  . Cholecystectomy  2010  . Cardiac catheterization    . Dexa  7/05  . Amputation Left 09/04/2012    Procedure: AMPUTATION BELOW KNEE;  Surgeon: Angelia Mould, MD;  Location: [place];  Service: Vascular;  Laterality: Left;  . Abdominal  angiogram N/A 08/31/2012    Procedure: ABDOMINAL ANGIOGRAM with runoff poss intervention;  Surgeon: Angelia Mould, MD;  [facility] CATH LAB;  Service: Cardiovascular;  Laterality: N/A;  . Tubal ligation    . Left heart catheterization with coronary angiogram N/A 06/22/2014    Procedure: LEFT HEART CATHETERIZATION WITH CORONARY ANGIOGRAM;  Surgeon: Burnell Blanks, MD;  Location: [facility] CATH LAB;  Service: Cardiovascular;  Laterality: N/A;  . Eye surgery    . Brain surgery      1997 blood clot on the brain, then had to relieve fluid on the brain  . Multiple extractions with alveoloplasty N/A 08/01/2014    Procedure: EXTRACTIONS OF TEETH NUMBERS 7 8 9 10 11  AND 19 AND ALVEOLOPLASTY UPPER LEFT AND RIGHT QUADRANT;  Surgeon: Isac Caddy, DDS;  Location: [place];  Service: Oral Surgery;  Laterality: N/A;  . Cardiac catheterization N/A 03/07/2015     Procedure: Left Heart Cath and Coronary Angiography;  Surgeon: Leonie Man, MD;  Location: [place] CV LAB;  Service: Cardiovascular;  Laterality: N/A;  . Av fistula placement Left 05/10/2015    Procedure: CREATION OF LEFT UPPER ARM ARTERIOVENOUS (AV) FISTULA ;  Surgeon: Elam Dutch, MD;  Location: [facility] OR;  Service: Vascular;  Laterality: Left;    Social History   Social History  . Marital Status: Married    Spouse Name: N/A  . Number of Children: 7  . Years of Education: N/A   Occupational History  . Retired    Social History Main Topics  . Smoking status: Never Smoker   . Smokeless tobacco: Never Used  . Alcohol Use: No     Comment: rare  . Drug Use: No  . Sexual Activity: No   Other Topics Concern  . Not on file   Social History Narrative    Current Outpatient Prescriptions on File Prior to Visit  Medication Sig Dispense Refill  . acetaminophen (TYLENOL) 500 MG tablet Take 500 mg by mouth every 6 (six) hours as needed for mild pain.    Marland Kitchen albuterol (PROVENTIL HFA;VENTOLIN HFA) 108 (90 BASE) MCG/ACT inhaler Inhale 2 puffs into the lungs every 4 (four) hours as needed for wheezing or shortness of breath. 1 Inhaler 3  . albuterol (PROVENTIL) (2.5 MG/3ML) 0.083% nebulizer solution Take 3 mLs (2.5 mg total) by nebulization every 4 (four) hours as needed for wheezing or shortness of breath. 75 mL 12  . aspirin EC 81 MG tablet Take 1 tablet (81 mg total) by mouth daily. 90 tablet 3  . clopidogrel (PLAVIX) 75 MG tablet Take 75 mg by mouth daily.    . feeding supplement (GLUCERNA SHAKE) LIQD Take 237 mLs by mouth every morning.     . Fluticasone-Salmeterol (ADVAIR DISKUS) 100-50 MCG/DOSE AEPB Inhale 1 puff into the lungs 2 (two) times daily. 1 each 11  . gabapentin (NEURONTIN) 100 MG capsule Take 1 capsule (100 mg total) by mouth at bedtime. 30 capsule 0  . HYDROcodone-acetaminophen (NORCO) 10-325 MG tablet Take 1 tablet by mouth every 6 (six) hours as needed for moderate  pain. 30 tablet 0  . insulin NPH-regular Human (NOVOLIN 70/30) (70-30) 100 UNIT/ML injection Inject 15 Units into the skin daily with breakfast. 10 mL 11  . isosorbide mononitrate (IMDUR) 60 MG 24 hr tablet Take 60 mg by mouth daily.  5  . latanoprost (XALATAN) 0.005 % ophthalmic solution Place 1 drop into both eyes at bedtime.    . nitroGLYCERIN (NITROSTAT) 0.4 MG  SL tablet Place 0.4 mg under the tongue every 5 (five) minutes as needed for chest pain.     . ONE TOUCH ULTRA TEST test strip USE TO TEST BLOOD SUGAR TWICE DAILY 100 each 2  . furosemide (LASIX) 40 MG tablet Take 40 mg by mouth daily. Reported on 07/21/2015  11   No current facility-administered medications on file prior to visit.    Allergies  Allergen Reactions  . Iohexol Itching and Rash     Code: RASH, Desc: [place] ON PT'S CHART ALLERGIC TO IV DYE 09/04/07/RM, Onset Date: [id number]     Family History  Problem Relation Age of Onset  . Cancer - Other Mother     "Stomach" Cancer  . Diabetes Mother   . Heart disease Mother   . Stomach cancer Mother   . Hypertension Mother   . Lymphoma Father   . Hypertension Father   . Kidney disease Paternal Grandmother   . Asthma Other   . Diabetes Sister   . Rheum arthritis Mother   . Rheum arthritis Father   . Heart attack Neg Hx   . Stroke Paternal Grandmother    BP 126/64 mmHg  Pulse 75  Temp(Src) 98 F (36.7 C) (Oral)  Ht 5\' 6"  (1.676 m)  Wt 182 lb (82.555 kg)  BMI 29.39 kg/m2  SpO2 92%   Review of Systems Denies LOC and fever.     Objective:   Physical Exam  VITAL SIGNS:  See vs page GENERAL: no distress.  In wheelchair Right heel: slight erythema and pain, but no ulcer or drainage      Assessment & Plan:  DM: slightly overcontrolled. Heel pain: ? early cellulitis Dyslipidemia: pt requests least expensive meds.  Patient is advised the following: Patient Instructions  Please reduce the insulin to 14 units each morning, and take it no matter what  your blood sugar is. check your blood sugar twice a day.  vary the time of day when you check, between before the 3 meals, and at bedtime.  also check if you have symptoms of your blood sugar being too high or too low.  please keep a record of the readings and bring it to your next appointment here (or you can bring the meter itself).  You can write it on any piece of paper.  please call us sooner if your blood sugar goes below 70, or if you have a lot of readings over 200.  Please come back for a follow-up appointment in 2 months.   i have sent a prescription to your pharmacy, to change zetia to "colestipol."  You should take this apart from other meds, as it likes to absorb many other meds.  i have sent a prescription to your pharmacy, for an antibiotic.  Please call if this does not help, and I'll refer you to a foot specialist.

## 2015-07-21 NOTE — Telephone Encounter (Signed)
Please call in one year supply to [facility] for all pt meds we rx

## 2015-07-22 ENCOUNTER — Other Ambulatory Visit: Payer: Self-pay | Admitting: Endocrinology

## 2015-07-24 ENCOUNTER — Other Ambulatory Visit: Payer: Self-pay

## 2015-07-24 ENCOUNTER — Ambulatory Visit: Payer: Medicare Other | Admitting: Pulmonary Disease

## 2015-07-24 NOTE — Telephone Encounter (Signed)
Please advise if ok to refill. Medication is listed under a historical provider.  Thanks!  

## 2015-07-25 ENCOUNTER — Ambulatory Visit: Payer: Medicare Other | Admitting: Pulmonary Disease

## 2015-07-26 ENCOUNTER — Encounter: Payer: Self-pay | Admitting: Cardiology

## 2015-07-26 ENCOUNTER — Ambulatory Visit (INDEPENDENT_AMBULATORY_CARE_PROVIDER_SITE_OTHER): Payer: Medicare Other | Admitting: Cardiology

## 2015-07-26 VITALS — BP 122/72 | HR 72 | Ht 66.0 in | Wt 180.0 lb

## 2015-07-26 DIAGNOSIS — I2583 Coronary atherosclerosis due to lipid rich plaque: Principal | ICD-10-CM

## 2015-07-26 DIAGNOSIS — I2510 Atherosclerotic heart disease of native coronary artery without angina pectoris: Secondary | ICD-10-CM | POA: Diagnosis not present

## 2015-07-26 DIAGNOSIS — I5043 Acute on chronic combined systolic (congestive) and diastolic (congestive) heart failure: Secondary | ICD-10-CM | POA: Diagnosis not present

## 2015-07-26 DIAGNOSIS — I214 Non-ST elevation (NSTEMI) myocardial infarction: Secondary | ICD-10-CM | POA: Diagnosis not present

## 2015-07-26 DIAGNOSIS — I10 Essential (primary) hypertension: Secondary | ICD-10-CM

## 2015-07-26 NOTE — Progress Notes (Signed)
Patient ID: [name], female   DOB: 05/10/1930, 82 y.o.   MRN: [id number]      Cardiology Office Note Date:  07/26/2015  Patient ID:  [name], [name] 1929/12/01, MRN [id number] PCP:  Renato Shin, MD  Cardiologist:  Dr. Meda Coffee   Chief Complaint: f/u hospitalization for CHF  History of Present Illness:  [name] is a 82 y.o. female with history of chronic combined CHF (EF 45-50% by [facility] 02/2015), CAD (NSTEMI 06/2014 with LHC showing moderate disease treated medically, recent cath 02/2015 with stable anatomy), DM, HTN, HLD, OSA (on CPAP), PVD s/p L BKA 08/2012, CKD stage III, COPD, bradycardia (not on BB due to this) who presents for post-hospital follow-up. Last echo 06/2014: EF 50-55%, no rWMA, grade 3 DD, mildly dilated LA.  She was admitted 9/19-9/22/16 with chest pain, SOB, left arm pain and right leg pain. She was admitted for [place] and had mild troponin elevation to 0.19. Cath 03/07/15 showed stable coronary angiography and elevated LVEDP - elevated troponin was felt more likely supply/demand in setting of CHF. Bidil was added in place of hydralazine, along with Ranexa. She also received IV Lasix post-cath. She had AKI on CKD with elevation of Cr to 1.83 and was kept in the hospital for further monitoring. At discharge her Cr was 1.94.   She comes in to clinic today doing well. She is accompanied by her husband. She was hospitalized in Npovember with acute on chronic CKD and started on HD. She is currently having it done via perm cath, s/p fistula surgery, not mature yet. Her weight 183 lbs, she has PND about 1x/week, some LE edema, no DOE or CP. No bleeding, no palpitations or syncope.  07/26/2015 - the patient is coming after 2 months, she feels better, she denies any orthopnea or proximal nocturnal dyspnea. She uses wheelchair for moving around and has no lower extremity edema her weight is now back to baseline at 180 pounds. She continues going to hemodialysis  session. She denies any chest pain and feels that for next helped but it's really expensive and she can't afford it. She denies any palpitations or syncope.   Past Medical History  Diagnosis Date  . Uterine cancer ([place])   . Diabetes mellitus     type II; peripheral neuropathy  . Hypertension   . GERD (gastroesophageal reflux disease)   . Peripheral vascular disease ([place])     a. s/p L BKA 08/2012.  . Obesity   . Dyslipidemia   . Chronic combined systolic and diastolic CHF (congestive heart failure) (HCC)     a. EF 50-55%, mild LVH and grade 1 diast. Dysfxn b. Grade 3 Diastolic Dysfunction [id number]. b. 02/2015: EF 45-50% by cath.  . Osteoarthritis cervical spine   . DM retinopathy ([place])   . Sleep apnea     with CPAP  . History of shingles   . COPD (chronic obstructive pulmonary disease) ([place])   . Depression   . Peptic ulcer disease     duodenal  . Peptic stricture of esophagus   . Hiatal hernia   . Diverticulosis   . Arthritis   . CAD (coronary artery disease)     a. s/p multiple caths with nonobs CAD;   b. cath 1/10: pLAD 20%, mLAD 40%, pCFX 20%, mCFX 40%, pRCA 60-70%;   c.  Myoview 06/02/12: Low anterior wall scar, no ischemia, EF 37%. d. cath 06/20/2014 70% mid LCx, medical therapy, high risk for PCI due to  close proximity to large OM e. cath 03/07/15 pro RCA 50% & pro to mid Cx 75%, both stable, LVEDP 33-49mm Hg-> medical management  . Cardiomyopathy ([place])     a. Echo 05/31/12: EF 40-45%. b. Echo 06/2014: EF 50-55%. c. Cath 02/2015: EF 45-50%.  . Asthma     Mild  . Pruritic condition     Idiopathic  . Zoster   . Noncompliance   . CKD (chronic kidney disease), stage III     Dr. Lorrene Reid  . Bradycardia     a. Not on BB due to this.  . Shortness of breath dyspnea     Past Surgical History  Procedure Laterality Date  . Tubal ligation  1967  . Knee arthroscopy  10/1998    Left  . Craniotomy  1997    Left for SDH  . Cataract extraction, bilateral  2005  . Hernia repair    .  Esophagogastroduodenoscopy  04/04/2004  . Spine surgery      C-spine and lumbar surgery  . Cholecystectomy  2010  . Cardiac catheterization    . Dexa  7/05  . Amputation Left 09/04/2012    Procedure: AMPUTATION BELOW KNEE;  Surgeon: Angelia Mould, MD;  Location: [place];  Service: Vascular;  Laterality: Left;  . Abdominal angiogram N/A 08/31/2012    Procedure: ABDOMINAL ANGIOGRAM with runoff poss intervention;  Surgeon: Angelia Mould, MD;  Location: [facility] CATH LAB;  Service: Cardiovascular;  Laterality: N/A;  . Tubal ligation    . Left heart catheterization with coronary angiogram N/A 06/22/2014    Procedure: LEFT HEART CATHETERIZATION WITH CORONARY ANGIOGRAM;  Surgeon: Burnell Blanks, MD;  Location: [facility] CATH LAB;  Service: Cardiovascular;  Laterality: N/A;  . Eye surgery    . Brain surgery      1997 blood clot on the brain, then had to relieve fluid on the brain  . Multiple extractions with alveoloplasty N/A 08/01/2014    Procedure: EXTRACTIONS OF TEETH NUMBERS 7 8 9 10 11  AND 19 AND ALVEOLOPLASTY UPPER LEFT AND RIGHT QUADRANT;  Surgeon: Isac Caddy, DDS;  Location: [place];  Service: Oral Surgery;  Laterality: N/A;  . Cardiac catheterization N/A 03/07/2015    Procedure: Left Heart Cath and Coronary Angiography;  Surgeon: Leonie Man, MD;  Location: [place] CV LAB;  Service: Cardiovascular;  Laterality: N/A;  . Av fistula placement Left 05/10/2015    Procedure: CREATION OF LEFT UPPER ARM ARTERIOVENOUS (AV) FISTULA ;  Surgeon: Elam Dutch, MD;  Location: [facility] OR;  Service: Vascular;  Laterality: Left;    Current Outpatient Prescriptions  Medication Sig Dispense Refill  . acetaminophen (TYLENOL) 500 MG tablet Take 500 mg by mouth every 6 (six) hours as needed for mild pain.    Marland Kitchen albuterol (PROVENTIL HFA;VENTOLIN HFA) 108 (90 BASE) MCG/ACT inhaler Inhale 2 puffs into the lungs every 4 (four) hours as needed for wheezing or shortness of breath. 1 Inhaler 3  .  albuterol (PROVENTIL) (2.5 MG/3ML) 0.083% nebulizer solution Take 3 mLs (2.5 mg total) by nebulization every 4 (four) hours as needed for wheezing or shortness of breath. 75 mL 12  . allopurinol (ZYLOPRIM) 100 MG tablet Take 1 tablet (100 mg total) by mouth daily. 90 tablet 3  . amLODipine (NORVASC) 10 MG tablet Take 1 tablet (10 mg total) by mouth daily. 90 tablet 3  . aspirin EC 81 MG tablet Take 1 tablet (81 mg total) by mouth daily. 90 tablet 3  .  atorvastatin (LIPITOR) 40 MG tablet TAKE 1 TABLET BY MOUTH DAILY AT 6 PM 90 tablet 3  . calcitRIOL (ROCALTROL) 0.25 MCG capsule Take 1 capsule (0.25 mcg total) by mouth daily. 90 capsule 3  . Cephalexin 500 MG tablet Take 1 tablet (500 mg total) by mouth 2 (two) times daily. 14 tablet 0  . clopidogrel (PLAVIX) 75 MG tablet Take 75 mg by mouth daily.    . colestipol (COLESTID) 1 g tablet Take 3 tablets (3 g total) by mouth daily. 270 tablet 3  . donepezil (ARICEPT) 5 MG tablet Take 1 tablet (5 mg total) by mouth at bedtime. 90 tablet 3  . feeding supplement (GLUCERNA SHAKE) LIQD Take 237 mLs by mouth every morning.     . Fluticasone-Salmeterol (ADVAIR DISKUS) 100-50 MCG/DOSE AEPB Inhale 1 puff into the lungs 2 (two) times daily. 1 each 11  . furosemide (LASIX) 40 MG tablet Take 40 mg by mouth daily. Reported on 07/21/2015  11  . gabapentin (NEURONTIN) 100 MG capsule TAKE 1 CAPSULE BY MOUTH EVERY NIGHT AT BEDTIME 30 capsule 0  . hydrALAZINE (APRESOLINE) 25 MG tablet Take 1 tablet (25 mg total) by mouth 3 (three) times daily. 270 tablet 3  . HYDROcodone-acetaminophen (NORCO) 10-325 MG tablet Take 1 tablet by mouth every 6 (six) hours as needed for moderate pain. 30 tablet 0  . insulin NPH-regular Human (NOVOLIN 70/30) (70-30) 100 UNIT/ML injection Inject 15 Units into the skin daily with breakfast. 10 mL 11  . isosorbide mononitrate (IMDUR) 60 MG 24 hr tablet Take 60 mg by mouth daily.  5  . latanoprost (XALATAN) 0.005 % ophthalmic solution Place 1 drop  into both eyes at bedtime.    . nitroGLYCERIN (NITROSTAT) 0.4 MG SL tablet Place 0.4 mg under the tongue every 5 (five) minutes as needed for chest pain.     . ONE TOUCH ULTRA TEST test strip USE TO TEST BLOOD SUGAR TWICE DAILY 100 each 2  . pantoprazole (PROTONIX) 40 MG tablet Take 1 tablet (40 mg total) by mouth 2 (two) times daily. 180 tablet 2  . RANEXA 500 MG 12 hr tablet TAKE 1 TABLET BY MOUTH EVERY 12 HOURS 60 tablet 1  . sertraline (ZOLOFT) 100 MG tablet Take 1 tablet (100 mg total) by mouth daily. 90 tablet 2   No current facility-administered medications for this visit.   Allergies:   Iohexol   Social History:  The patient  reports that she has never smoked. She has never used smokeless tobacco. She reports that she does not drink alcohol or use illicit drugs.   Family History:  The patient's family history includes Asthma in her other; Cancer - Other in her mother; Diabetes in her mother and sister; Heart disease in her mother; Hypertension in her father and mother; Kidney disease in her paternal grandmother; Lymphoma in her father; Rheum arthritis in her father and mother; Stomach cancer in her mother; Stroke in her paternal grandmother. There is no history of Heart attack.*  ROS:  Please see the history of present illness.    All other systems are reviewed and otherwise negative.   PHYSICAL EXAM:  VS:  BP 122/72 mmHg  Pulse 72  Ht 5\' 6"  (1.676 m)  Wt 180 lb (81.647 kg)  BMI 29.07 kg/m2 BMI: Body mass index is 29.07 kg/(m^2). Well nourished obese AAF, in no acute distress HEENT: normocephalic, atraumatic Neck: no JVD, carotid bruits or masses Cardiac:  normal S1, S2; RRR; no murmurs, rubs, or gallops  Lungs:  clear to auscultation bilaterally, no wheezing, rhonchi or rales Abd: soft, nontender, no hepatomegaly, + BS MS: no deformity or atrophy Ext: no edema on the right, prosthesis on the left, right radial cath site with resolving ecchymosis, no hematoma, good pulse Skin:  warm and dry, no rash Neuro:  moves all extremities spontaneously, no focal abnormalities noted, follows commands Psych: euthymic mood, full affect  EKG:  Done today shows NSR 71bpm, lateral infarct age undetermined, nonspecific ST-T changes, QTc 440ms, no acute change from prior.  Recent Labs: 08/18/2014: TSH 1.30 04/24/2015: ALT 32; B Natriuretic Peptide 1594.7* 04/25/2015: Magnesium 1.9 05/15/2015: BUN 76*; Creatinine, Ser 3.96*; Hemoglobin 10.8*; Platelets 252; Potassium 4.2; Sodium 136  08/18/2014: Cholesterol 118; HDL 68.30; LDL Cholesterol 31; Total CHOL/HDL Ratio 2; Triglycerides 96.0; VLDL 19.2   CrCl cannot be calculated (Patient has no serum creatinine result on file.).   Wt Readings from Last 3 Encounters:  07/26/15 180 lb (81.647 kg)  07/21/15 182 lb (82.555 kg)  06/28/15 178 lb 4.8 oz (80.876 kg)    Other studies reviewed: Additional studies/records reviewed today include: summarized above   ASSESSMENT AND PLAN:  1. Acute on chronic combined CHF - now euvolemic at her baseline weight, 180 lbs, on lasix producing minimal amount of urine. 2. CKD stage V - ESRD on HD. 3. CAD as above - QTC stable on Ranexa. No recurrent angina. Continue BABY ASPIRIN and Plavix.She cant afford it, we will try to give her free samples. 4. Essential HTN - controlled.No hypotension during HD. 5. H/o sinus bradycardia - stable. Not on BB due to this.  Follow up in 3 months.  Corliss Blacker  07/26/2015 10:28 AM     [address] [place] [address] [phone] (office)  [phone] (fax)

## 2015-07-26 NOTE — Patient Instructions (Signed)
Your physician recommends that you continue on your current medications as directed. Please refer to the Current Medication list given to you today.    Your physician recommends that you schedule a follow-up appointment in: 3 MONTHS WITH DR NELSON  

## 2015-07-28 ENCOUNTER — Encounter: Payer: Self-pay | Admitting: Adult Health

## 2015-07-28 ENCOUNTER — Ambulatory Visit (INDEPENDENT_AMBULATORY_CARE_PROVIDER_SITE_OTHER): Payer: Medicare Other | Admitting: Adult Health

## 2015-07-28 VITALS — BP 124/62 | HR 64 | Temp 97.9°F | Ht 66.0 in | Wt 170.0 lb

## 2015-07-28 DIAGNOSIS — G4733 Obstructive sleep apnea (adult) (pediatric): Secondary | ICD-10-CM | POA: Diagnosis not present

## 2015-07-28 NOTE — Patient Instructions (Signed)
Continue with cpap, and keep up with mask changes and supplies. Work on weight loss Followup with one year with Dr. Murlean Iba  And As needed

## 2015-08-01 ENCOUNTER — Telehealth: Payer: Self-pay | Admitting: Endocrinology

## 2015-08-01 NOTE — Telephone Encounter (Signed)
Attempted to reach the pt. Will try again at a later time.  

## 2015-08-01 NOTE — Telephone Encounter (Signed)
Pt states she has a problem with constipation she has not gone in 3 or 4 days, please advise on what to do

## 2015-08-01 NOTE — Telephone Encounter (Signed)
See note below and please advise. Thanks! 

## 2015-08-01 NOTE — Telephone Encounter (Signed)
For now: Take 4 dulcolax pills.  To keep bowel going: Miralax, capful, twice a day

## 2015-08-01 NOTE — Telephone Encounter (Addendum)
Pt notified of note below and voiced understanding.

## 2015-08-06 ENCOUNTER — Other Ambulatory Visit: Payer: Self-pay | Admitting: Internal Medicine

## 2015-08-06 ENCOUNTER — Other Ambulatory Visit: Payer: Self-pay | Admitting: Endocrinology

## 2015-08-07 NOTE — Progress Notes (Signed)
   Subjective:    Patient ID: [name], female    DOB: Jun 30, 1929, 82 y.o.   MRN: [id number]  HPI  82 yo female with severe OSA on nocturnal CPAP   She has ESRD on HD .  Has CAD and HTN .   TEST  NPSG 2002 : AHI 77/hr   08/07/2015 Follow up : OSA  Pt presents for a follow up for sleep apnea.  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  She has severe OSA . Says she is doing well on CPAP at bedtime . Wears mask everynight for at least 4hrs.  Feels rested and feels mask is doing well without leaks.  She denies sign daytime sleepiness.    Review of Systems Constitutional:   No  weight loss, night sweats,  Fevers, chills,  +fatigue, or  lassitude.  HEENT:   No headaches,  Difficulty swallowing,  Tooth/dental problems, or  Sore throat,                No sneezing, itching, ear ache, nasal congestion, post nasal drip,   CV:  No chest pain,  Orthopnea, PND, swelling in lower extremities, anasarca, dizziness, palpitations, syncope.   GI  No heartburn, indigestion, abdominal pain, nausea, vomiting, diarrhea, change in bowel habits, loss of appetite, bloody stools.   Resp:   No chest wall deformity  Skin: no rash or lesions.  GU: no dysuria, change in color of urine, no urgency or frequency.  No flank pain, no hematuria   MS:  No joint pain or swelling.  No decreased range of motion.  No back pain.  Psych:  No change in mood or affect. No depression or anxiety.  No memory loss.         Objective:   Physical Exam  Filed Vitals:   07/28/15 1151  BP: 124/62  Pulse: 64  Temp: 97.9 F (36.6 C)  TempSrc: Oral  Height: 5\' 6"  (1.676 m)  Weight: 170 lb (77.111 kg)  SpO2: 98%   Body mass index is 27.45 kg/(m^2).   GEN: A/Ox3; pleasant , NAD, obese ,    HEENT:  [place]/AT,  EACs-clear, TMs-wnl, NOSE-clear, THROAT-clear, no lesions, no postnasal drip or exudate noted. Class 2-3 MP airway   NECK:  Supple w/ fair ROM; no JVD; normal carotid impulses w/o bruits; no thyromegaly  or nodules palpated; no lymphadenopathy.  RESP  Clear  P & A; w/o, wheezes/ rales/ or rhonchi.no accessory muscle use, no dullness to percussion  CARD:  RRR, no m/r/g  , no peripheral edema, pulses intact, no cyanosis or clubbing.  GI:   Soft & nt; nml bowel sounds; no organomegaly or masses detected.  Musco: Warm bil, no deformities or joint swelling noted.   Neuro: alert, no focal deficits noted.    Skin: Warm, no lesions or rashes        Assessment & Plan:

## 2015-08-07 NOTE — Assessment & Plan Note (Signed)
Severe OSA controlled well on CPAP   Plan  Continue with cpap, and keep up with mask changes and supplies. Work on weight loss Followup with one year with Dr. Murlean Iba  And As needed

## 2015-08-14 ENCOUNTER — Other Ambulatory Visit: Payer: Self-pay | Admitting: Endocrinology

## 2015-08-17 ENCOUNTER — Emergency Department (INDEPENDENT_AMBULATORY_CARE_PROVIDER_SITE_OTHER): Payer: Medicare Other

## 2015-08-17 ENCOUNTER — Encounter (HOSPITAL_COMMUNITY): Payer: Self-pay | Admitting: *Deleted

## 2015-08-17 ENCOUNTER — Emergency Department (HOSPITAL_COMMUNITY)
Admission: EM | Admit: 2015-08-17 | Discharge: 2015-08-17 | Disposition: A | Discharge status: Other Acute Inpatient Hospital | Payer: Medicare Other | Source: Home / Self Care

## 2015-08-17 ENCOUNTER — Emergency Department (HOSPITAL_COMMUNITY)
Admission: EM | Admit: 2015-08-17 | Discharge: 2015-08-18 | Disposition: A | Discharge status: Home / Self Care | Payer: Medicare Other | Attending: Emergency Medicine | Admitting: Emergency Medicine

## 2015-08-17 ENCOUNTER — Encounter (HOSPITAL_COMMUNITY): Payer: Self-pay | Admitting: Emergency Medicine

## 2015-08-17 DIAGNOSIS — K5901 Slow transit constipation: Secondary | ICD-10-CM

## 2015-08-17 DIAGNOSIS — Z9889 Other specified postprocedural states: Secondary | ICD-10-CM | POA: Insufficient documentation

## 2015-08-17 DIAGNOSIS — Z9981 Dependence on supplemental oxygen: Secondary | ICD-10-CM | POA: Insufficient documentation

## 2015-08-17 DIAGNOSIS — E669 Obesity, unspecified: Secondary | ICD-10-CM | POA: Diagnosis not present

## 2015-08-17 DIAGNOSIS — R531 Weakness: Secondary | ICD-10-CM | POA: Diagnosis not present

## 2015-08-17 DIAGNOSIS — Z8541 Personal history of malignant neoplasm of cervix uteri: Secondary | ICD-10-CM | POA: Insufficient documentation

## 2015-08-17 DIAGNOSIS — I5042 Chronic combined systolic (congestive) and diastolic (congestive) heart failure: Secondary | ICD-10-CM | POA: Insufficient documentation

## 2015-08-17 DIAGNOSIS — E785 Hyperlipidemia, unspecified: Secondary | ICD-10-CM | POA: Insufficient documentation

## 2015-08-17 DIAGNOSIS — Z7901 Long term (current) use of anticoagulants: Secondary | ICD-10-CM | POA: Diagnosis not present

## 2015-08-17 DIAGNOSIS — G4730 Sleep apnea, unspecified: Secondary | ICD-10-CM | POA: Insufficient documentation

## 2015-08-17 DIAGNOSIS — I2510 Atherosclerotic heart disease of native coronary artery without angina pectoris: Secondary | ICD-10-CM | POA: Insufficient documentation

## 2015-08-17 DIAGNOSIS — K219 Gastro-esophageal reflux disease without esophagitis: Secondary | ICD-10-CM | POA: Diagnosis not present

## 2015-08-17 DIAGNOSIS — Z7982 Long term (current) use of aspirin: Secondary | ICD-10-CM | POA: Insufficient documentation

## 2015-08-17 DIAGNOSIS — Z7951 Long term (current) use of inhaled steroids: Secondary | ICD-10-CM | POA: Insufficient documentation

## 2015-08-17 DIAGNOSIS — R42 Dizziness and giddiness: Secondary | ICD-10-CM | POA: Insufficient documentation

## 2015-08-17 DIAGNOSIS — Z792 Long term (current) use of antibiotics: Secondary | ICD-10-CM | POA: Insufficient documentation

## 2015-08-17 DIAGNOSIS — Z872 Personal history of diseases of the skin and subcutaneous tissue: Secondary | ICD-10-CM | POA: Diagnosis not present

## 2015-08-17 DIAGNOSIS — J449 Chronic obstructive pulmonary disease, unspecified: Secondary | ICD-10-CM | POA: Diagnosis not present

## 2015-08-17 DIAGNOSIS — I129 Hypertensive chronic kidney disease with stage 1 through stage 4 chronic kidney disease, or unspecified chronic kidney disease: Secondary | ICD-10-CM | POA: Diagnosis not present

## 2015-08-17 DIAGNOSIS — M47812 Spondylosis without myelopathy or radiculopathy, cervical region: Secondary | ICD-10-CM | POA: Diagnosis not present

## 2015-08-17 DIAGNOSIS — Z794 Long term (current) use of insulin: Secondary | ICD-10-CM | POA: Diagnosis not present

## 2015-08-17 DIAGNOSIS — N183 Chronic kidney disease, stage 3 (moderate): Secondary | ICD-10-CM | POA: Insufficient documentation

## 2015-08-17 DIAGNOSIS — Z79899 Other long term (current) drug therapy: Secondary | ICD-10-CM | POA: Diagnosis not present

## 2015-08-17 DIAGNOSIS — F329 Major depressive disorder, single episode, unspecified: Secondary | ICD-10-CM | POA: Insufficient documentation

## 2015-08-17 DIAGNOSIS — E119 Type 2 diabetes mellitus without complications: Secondary | ICD-10-CM | POA: Diagnosis not present

## 2015-08-17 DIAGNOSIS — Z8619 Personal history of other infectious and parasitic diseases: Secondary | ICD-10-CM | POA: Diagnosis not present

## 2015-08-17 DIAGNOSIS — K5900 Constipation, unspecified: Secondary | ICD-10-CM | POA: Diagnosis present

## 2015-08-17 LAB — CBC WITH DIFFERENTIAL/PLATELET
BASOS ABS: 0 10*3/uL (ref 0.0–0.1)
Basophils Relative: 0 %
Eosinophils Absolute: 0.1 10*3/uL (ref 0.0–0.7)
Eosinophils Relative: 1 %
HEMATOCRIT: 42 % (ref 36.0–46.0)
HEMOGLOBIN: 13.8 g/dL (ref 12.0–15.0)
LYMPHS ABS: 1.7 10*3/uL (ref 0.7–4.0)
LYMPHS PCT: 22 %
MCH: 29.2 pg (ref 26.0–34.0)
MCHC: 32.9 g/dL (ref 30.0–36.0)
MCV: 88.8 fL (ref 78.0–100.0)
Monocytes Absolute: 0.5 10*3/uL (ref 0.1–1.0)
Monocytes Relative: 7 %
NEUTROS ABS: 5.1 10*3/uL (ref 1.7–7.7)
Neutrophils Relative %: 70 %
Platelets: 271 10*3/uL (ref 150–400)
RBC: 4.73 MIL/uL (ref 3.87–5.11)
RDW: 15.4 % (ref 11.5–15.5)
WBC: 7.4 10*3/uL (ref 4.0–10.5)

## 2015-08-17 LAB — COMPREHENSIVE METABOLIC PANEL
ALK PHOS: 76 U/L (ref 38–126)
ALT: 19 U/L (ref 14–54)
AST: 34 U/L (ref 15–41)
Albumin: 4.5 g/dL (ref 3.5–5.0)
Anion gap: 16 — ABNORMAL HIGH (ref 5–15)
BILIRUBIN TOTAL: 0.9 mg/dL (ref 0.3–1.2)
BUN: 18 mg/dL (ref 6–20)
CALCIUM: 9.7 mg/dL (ref 8.9–10.3)
CHLORIDE: 95 mmol/L — AB (ref 101–111)
CO2: 27 mmol/L (ref 22–32)
CREATININE: 2.62 mg/dL — AB (ref 0.44–1.00)
GFR calc Af Amer: 18 mL/min — ABNORMAL LOW (ref >60)
GFR, EST NON AFRICAN AMERICAN: 16 mL/min — AB (ref >60)
Glucose, Bld: 147 mg/dL — ABNORMAL HIGH (ref 65–99)
Potassium: 4.1 mmol/L (ref 3.5–5.1)
Sodium: 138 mmol/L (ref 135–145)
TOTAL PROTEIN: 7.7 g/dL (ref 6.5–8.1)

## 2015-08-17 MED ORDER — POLYETHYLENE GLYCOL 3350 17 G PO PACK: 17.0000 g | PACK | Freq: Every day | ORAL | Status: DC

## 2015-08-17 NOTE — ED Provider Notes (Addendum)
CSN: [id number]     Arrival date & time 08/17/15  1710 History   None    Chief Complaint  Patient presents with  . Constipation   (Consider location/radiation/quality/duration/timing/severity/associated sxs/prior Treatment) Patient is a 82 y.o. female presenting with constipation. The history is provided by the patient and the spouse.  Constipation Severity:  Moderate Time since last bowel movement:  2 days Progression:  Unchanged Chronicity:  Chronic Context comment:  Recent disimpaction by family but now relapsed with no bm x2 days, no n/v. Associated symptoms: no abdominal pain, no diarrhea, no nausea and no vomiting     Past Medical History  Diagnosis Date  . Uterine cancer ([place])   . Diabetes mellitus     type II; peripheral neuropathy  . Hypertension   . GERD (gastroesophageal reflux disease)   . Peripheral vascular disease ([place])     a. s/p L BKA 08/2012.  . Obesity   . Dyslipidemia   . Chronic combined systolic and diastolic CHF (congestive heart failure) ([facility])     a. EF 50-55%, mild LVH and grade 1 diast. Dysfxn b. Grade 3 Diastolic Dysfunction [id number]. b. 02/2015: EF 45-50% by cath.  . Osteoarthritis cervical spine   . DM retinopathy ([place])   . Sleep apnea     with CPAP  . History of shingles   . COPD (chronic obstructive pulmonary disease) ([place])   . Depression   . Peptic ulcer disease     duodenal  . Peptic stricture of esophagus   . Hiatal hernia   . Diverticulosis   . Arthritis   . CAD (coronary artery disease)     a. s/p multiple caths with nonobs CAD;   b. cath 1/10: pLAD 20%, mLAD 40%, pCFX 20%, mCFX 40%, pRCA 60-70%;   c.  Myoview 06/02/12: Low anterior wall scar, no ischemia, EF 37%. d. cath 06/20/2014 70% mid LCx, medical therapy, high risk for PCI due to close proximity to large OM e. cath 03/07/15 pro RCA 50% & pro to mid Cx 75%, both stable, LVEDP 33-68mm Hg-> medical management  . Cardiomyopathy ([place])     a. Echo 05/31/12: EF 40-45%. b. Echo 06/2014: EF  50-55%. c. Cath 02/2015: EF 45-50%.  . Asthma     Mild  . Pruritic condition     Idiopathic  . Zoster   . Noncompliance   . CKD (chronic kidney disease), stage III     Dr. Lorrene Reid  . Bradycardia     a. Not on BB due to this.  . Shortness of breath dyspnea    Past Surgical History  Procedure Laterality Date  . Tubal ligation  1967  . Knee arthroscopy  10/1998    Left  . Craniotomy  1997    Left for SDH  . Cataract extraction, bilateral  2005  . Hernia repair    . Esophagogastroduodenoscopy  04/04/2004  . Spine surgery      C-spine and lumbar surgery  . Cholecystectomy  2010  . Cardiac catheterization    . Dexa  7/05  . Amputation Left 09/04/2012    Procedure: AMPUTATION BELOW KNEE;  Surgeon: Angelia Mould, MD;  Location: [facility];  Service: Vascular;  Laterality: Left;  . Abdominal angiogram N/A 08/31/2012    Procedure: ABDOMINAL ANGIOGRAM with runoff poss intervention;  Surgeon: Angelia Mould, MD;  Location[facility] CATH LAB;  Service: Cardiovascular;  Laterality: N/A;  . Tubal ligation    . Left heart catheterization with coronary angiogram N/A  06/22/2014    Procedure: LEFT HEART CATHETERIZATION WITH CORONARY ANGIOGRAM;  Surgeon: Burnell Blanks, MD;  Location: [facility] CATH LAB;  Service: Cardiovascular;  Laterality: N/A;  . Eye surgery    . Brain surgery      1997 blood clot on the brain, then had to relieve fluid on the brain  . Multiple extractions with alveoloplasty N/A 08/01/2014    Procedure: EXTRACTIONS OF TEETH NUMBERS 7 8 9 10 11  AND 19 AND ALVEOLOPLASTY UPPER LEFT AND RIGHT QUADRANT;  Surgeon: Isac Caddy, DDS;  Location: [facility];  Service: Oral Surgery;  Laterality: N/A;  . Cardiac catheterization N/A 03/07/2015    Procedure: Left Heart Cath and Coronary Angiography;  Surgeon: Leonie Man, MD;  Location: [facility];  Service: Cardiovascular;  Laterality: N/A;  . Av fistula placement Left 05/10/2015    Procedure: CREATION OF LEFT UPPER ARM  ARTERIOVENOUS (AV) FISTULA ;  Surgeon: Elam Dutch, MD;  Location: [facility] OR;  Service: Vascular;  Laterality: Left;   Family History  Problem Relation Age of Onset  . Cancer - Other Mother     "Stomach" Cancer  . Diabetes Mother   . Heart disease Mother   . Stomach cancer Mother   . Hypertension Mother   . Lymphoma Father   . Hypertension Father   . Kidney disease Paternal Grandmother   . Asthma Other   . Diabetes Sister   . Rheum arthritis Mother   . Rheum arthritis Father   . Heart attack Neg Hx   . Stroke Paternal Grandmother    Social History  Substance Use Topics  . Smoking status: Never Smoker   . Smokeless tobacco: Never Used  . Alcohol Use: No     Comment: rare   OB History    No data available     Review of Systems  Constitutional: Negative.   Gastrointestinal: Positive for constipation. Negative for nausea, vomiting, abdominal pain, diarrhea and blood in stool.  Genitourinary: Negative.   All other systems reviewed and are negative.   Allergies  Iohexol  Home Medications   Prior to Admission medications   Medication Sig Start Date End Date Taking? Authorizing Provider  acetaminophen (TYLENOL) 500 MG tablet Take 500 mg by mouth every 6 (six) hours as needed for mild pain.    Historical Provider, MD  albuterol (PROVENTIL HFA;VENTOLIN HFA) 108 (90 BASE) MCG/ACT inhaler Inhale 2 puffs into the lungs every 4 (four) hours as needed for wheezing or shortness of breath. 04/19/15   Renato Shin, MD  albuterol (PROVENTIL) (2.5 MG/3ML) 0.083% nebulizer solution Take 3 mLs (2.5 mg total) by nebulization every 4 (four) hours as needed for wheezing or shortness of breath. 12/08/13   Robbie Lis, MD  allopurinol (ZYLOPRIM) 100 MG tablet Take 1 tablet (100 mg total) by mouth daily. 07/21/15   Renato Shin, MD  allopurinol (ZYLOPRIM) 100 MG tablet TAKE 1 TABLET BY MOUTH EVERY DAY 08/14/15   Renato Shin, MD  amLODipine (NORVASC) 10 MG tablet Take 1 tablet (10 mg total) by  mouth daily. 07/21/15   Renato Shin, MD  aspirin EC 81 MG tablet Take 1 tablet (81 mg total) by mouth daily. 03/16/15   Dayna N Dunn, PA-C  atorvastatin (LIPITOR) 40 MG tablet TAKE 1 TABLET BY MOUTH DAILY AT 6 PM 07/21/15   Renato Shin, MD  calcitRIOL (ROCALTROL) 0.25 MCG capsule Take 1 capsule (0.25 mcg total) by mouth daily. 07/21/15   Renato Shin, MD  Cephalexin  500 MG tablet Take 1 tablet (500 mg total) by mouth 2 (two) times daily. 07/21/15   Renato Shin, MD  clopidogrel (PLAVIX) 75 MG tablet Take 75 mg by mouth daily.    Historical Provider, MD  colestipol (COLESTID) 1 g tablet Take 3 tablets (3 g total) by mouth daily. 07/21/15   Renato Shin, MD  donepezil (ARICEPT) 5 MG tablet Take 1 tablet (5 mg total) by mouth at bedtime. 07/21/15   Renato Shin, MD  ezetimibe (ZETIA) 10 MG tablet TAKE 1 TABLET BY MOUTH EVERY MORNING 08/21/15   Renato Shin, MD  feeding supplement (GLUCERNA SHAKE) LIQD Take 237 mLs by mouth every morning.     Historical Provider, MD  Fluticasone-Salmeterol (ADVAIR DISKUS) 100-50 MCG/DOSE AEPB Inhale 1 puff into the lungs 2 (two) times daily. 04/19/15   Renato Shin, MD  furosemide (LASIX) 40 MG tablet Take 40 mg by mouth daily. Reported on 07/28/2015 04/19/15   Historical Provider, MD  gabapentin (NEURONTIN) 100 MG capsule TAKE 1 CAPSULE BY MOUTH EVERY NIGHT AT BEDTIME 08/21/15   Renato Shin, MD  hydrALAZINE (APRESOLINE) 25 MG tablet Take 1 tablet (25 mg total) by mouth 3 (three) times daily. 07/21/15   Renato Shin, MD  HYDROcodone-acetaminophen (NORCO) 10-325 MG tablet Take 1 tablet by mouth every 6 (six) hours as needed for moderate pain. 05/12/15   Nita Sells, MD  insulin NPH-regular Human (NOVOLIN 70/30) (70-30) 100 UNIT/ML injection Inject 15 Units into the skin daily with breakfast. 06/21/15   Renato Shin, MD  isosorbide mononitrate (IMDUR) 60 MG 24 hr tablet Take 60 mg by mouth daily. 04/05/15   Historical Provider, MD  latanoprost (XALATAN) 0.005 % ophthalmic solution  Place 1 drop into both eyes at bedtime.    Historical Provider, MD  nitroGLYCERIN (NITROSTAT) 0.4 MG SL tablet Place 0.4 mg under the tongue every 5 (five) minutes as needed for chest pain.     Historical Provider, MD  ONE TOUCH ULTRA TEST test strip USE TO TEST BLOOD SUGAR TWICE DAILY 07/14/15   Renato Shin, MD  pantoprazole (PROTONIX) 40 MG tablet Take 1 tablet (40 mg total) by mouth 2 (two) times daily. 07/21/15   Renato Shin, MD  pantoprazole (PROTONIX) 40 MG tablet TAKE 1 TABLET BY MOUTH TWICE DAILY 09/04/15   Renato Shin, MD  polyethylene glycol (MIRALAX / GLYCOLAX) packet Take 17 g by mouth daily. 08/17/15   Billy Fischer, MD  RANEXA 500 MG 12 hr tablet TAKE 1 TABLET BY MOUTH EVERY 12 HOURS 07/21/15   Gildardo Cranker, DO  sertraline (ZOLOFT) 100 MG tablet Take 1 tablet (100 mg total) by mouth daily. 07/21/15   Renato Shin, MD   Meds Ordered and Administered this Visit  Medications - No data to display  BP 151/81 mmHg  Pulse 74  Temp(Src) 98.6 F (37 C) (Oral)  Resp 16  SpO2 99% No data found.   Physical Exam  ED Course  Procedures (including critical care time)  Labs Review Labs Reviewed - No data to display  Imaging Review No results found. X-rays reviewed and report per radiologist.   Visual Acuity Review  Right Eye Distance:   Left Eye Distance:   Bilateral Distance:    Right Eye Near:   Left Eye Near:    Bilateral Near:         MDM   1. Constipation by delayed colonic transit    Meds ordered this encounter  Medications  . polyethylene glycol (MIRALAX / GLYCOLAX) packet  Sig: Take 17 g by mouth daily.    Dispense:  30 each    Refill:  1    Sent for eval of recurrent constipation problem at husband' insistance.   Billy Fischer, MD 08/17/15 [place], MD 08/17/15 [id number]  Billy Fischer, MD 08/22/15 1517  Billy Fischer, MD 09/04/15 [phone]

## 2015-08-17 NOTE — ED Notes (Signed)
Pt. reports constipation for 3 days with low abdominal pressure/discomfort unrelieved by laxative and enema , denies emesis or fever . Hemodialysis patient q Tues/Thurs/Sat.

## 2015-08-17 NOTE — ED Notes (Signed)
Pt  Reports  Has  Been  Constipated      Since   Last   Week    pt  Had  A      Hard  bm  Several  Days  Ago      denys  Any  Vomiting   No  Vomiting           Pt  Reports      Symptoms  Not  releived  By  Laxative       And      Enema

## 2015-08-17 NOTE — Discharge Instructions (Signed)
See your doctor if further problems, drink plenty of water with medicine.

## 2015-08-17 NOTE — ED Provider Notes (Signed)
CSN: [id number]     Arrival date & time 08/17/15  2015 History   By signing my name below, I, Nicole Kindred, attest that this documentation has been prepared under the direction and in the presence of Ripley Fraise, MD.   Electronically Signed: Nicole Kindred, ED Scribe. 08/17/2015. 11:32 PM    Chief Complaint  Patient presents with  . Constipation    Patient is a 82 y.o. female presenting with constipation. The history is provided by the patient. No language interpreter was used.  Constipation Severity:  Moderate Time since last bowel movement:  2 days Timing:  Constant Progression:  Unchanged Chronicity:  New Stool description:  None produced Unusual stool frequency:  No bowel movement for 2-3 days.  Relieved by:  Nothing Worsened by:  Nothing tried Ineffective treatments:  None tried Associated symptoms: abdominal pain   Associated symptoms: no dysuria and no fever    HPI Comments: [name] is a 82 y.o. female who presents to the Emergency Department complaining of gradual onset, constipation, onset about three days ago. She reports associated dizziness, generalized weakness, flank pain, and abdominal pain. No worsening or alleviating factors noted. Pt denies fever, dysuria, cough, chest pain, or any other pertinent symptoms. She visited the UC this morning and was instructed to come to the ED for treatment.    She is on dialysis and does not make much urine  Past Medical History  Diagnosis Date  . Uterine cancer ([place])   . Diabetes mellitus     type II; peripheral neuropathy  . Hypertension   . GERD (gastroesophageal reflux disease)   . Peripheral vascular disease ([place])     a. s/p L BKA 08/2012.  . Obesity   . Dyslipidemia   . Chronic combined systolic and diastolic CHF (congestive heart failure) (HCC)     a. EF 50-55%, mild LVH and grade 1 diast. Dysfxn b. Grade 3 Diastolic Dysfunction [id number]. b. 02/2015: EF 45-50% by cath.  . Osteoarthritis cervical  spine   . DM retinopathy ([place])   . Sleep apnea     with CPAP  . History of shingles   . COPD (chronic obstructive pulmonary disease) ([place])   . Depression   . Peptic ulcer disease     duodenal  . Peptic stricture of esophagus   . Hiatal hernia   . Diverticulosis   . Arthritis   . CAD (coronary artery disease)     a. s/p multiple caths with nonobs CAD;   b. cath 1/10: pLAD 20%, mLAD 40%, pCFX 20%, mCFX 40%, pRCA 60-70%;   c.  Myoview 06/02/12: Low anterior wall scar, no ischemia, EF 37%. d. cath 06/20/2014 70% mid LCx, medical therapy, high risk for PCI due to close proximity to large OM e. cath 03/07/15 pro RCA 50% & pro to mid Cx 75%, both stable, LVEDP 33-78mm Hg-> medical management  . Cardiomyopathy ([place])     a. Echo 05/31/12: EF 40-45%. b. Echo 06/2014: EF 50-55%. c. Cath 02/2015: EF 45-50%.  . Asthma     Mild  . Pruritic condition     Idiopathic  . Zoster   . Noncompliance   . CKD (chronic kidney disease), stage III     Dr. Lorrene Reid  . Bradycardia     a. Not on BB due to this.  . Shortness of breath dyspnea    Past Surgical History  Procedure Laterality Date  . Tubal ligation  1967  . Knee arthroscopy  10/1998  Left  . Craniotomy  1997    Left for SDH  . Cataract extraction, bilateral  2005  . Hernia repair    . Esophagogastroduodenoscopy  04/04/2004  . Spine surgery      C-spine and lumbar surgery  . Cholecystectomy  2010  . Cardiac catheterization    . Dexa  7/05  . Amputation Left 09/04/2012    Procedure: AMPUTATION BELOW KNEE;  Surgeon: Angelia Mould, MD;  Location: [place];  Service: Vascular;  Laterality: Left;  . Abdominal angiogram N/A 08/31/2012    Procedure: ABDOMINAL ANGIOGRAM with runoff poss intervention;  Surgeon: Angelia Mould, MD;  Location[facility] C[facility] LAB;  Service: Cardiovascular;  Laterality: N/A;  . Tubal ligation    . Left heart catheterization with coronary angiogram N/A 06/22/2014    Procedure: LEFT HEART CATHETERIZATION WITH CORONARY  ANGIOGRAM;  Surgeon: Burnell Blanks, MD;  Location: [facility] CATH LAB;  Service: Cardiovascular;  Laterality: N/A;  . Eye surgery    . Brain surgery      1997 blood clot on the brain, then had to relieve fluid on the brain  . Multiple extractions with alveoloplasty N/A 08/01/2014    Procedure: EXTRACTIONS OF TEETH NUMBERS 7 8 9 10 11  AND 19 AND ALVEOLOPLASTY UPPER LEFT AND RIGHT QUADRANT;  Surgeon: Isac Caddy, DDS;  Location: [place];  Service: Oral Surgery;  Laterality: N/A;  . Cardiac catheterization N/A 03/07/2015    Procedure: Left Heart Cath and Coronary Angiography;  Surgeon: Leonie Man, MD;  Location: [place] CV LAB;  Service: Cardiovascular;  Laterality: N/A;  . Av fistula placement Left 05/10/2015    Procedure: CREATION OF LEFT UPPER ARM ARTERIOVENOUS (AV) FISTULA ;  Surgeon: Elam Dutch, MD;  Location: [facility] OR;  Service: Vascular;  Laterality: Left;   Family History  Problem Relation Age of Onset  . Cancer - Other Mother     "Stomach" Cancer  . Diabetes Mother   . Heart disease Mother   . Stomach cancer Mother   . Hypertension Mother   . Lymphoma Father   . Hypertension Father   . Kidney disease Paternal Grandmother   . Asthma Other   . Diabetes Sister   . Rheum arthritis Mother   . Rheum arthritis Father   . Heart attack Neg Hx   . Stroke Paternal Grandmother    Social History  Substance Use Topics  . Smoking status: Never Smoker   . Smokeless tobacco: Never Used  . Alcohol Use: No     Comment: rare   OB History    No data available     Review of Systems  Constitutional: Negative for fever.  Respiratory: Negative for cough.   Cardiovascular: Negative for chest pain.  Gastrointestinal: Positive for abdominal pain and constipation.  Genitourinary: Negative for dysuria.  Neurological: Negative for weakness.  All other systems reviewed and are negative.     Allergies  Iohexol  Home Medications   Prior to Admission medications    Medication Sig Start Date End Date Taking? Authorizing Provider  acetaminophen (TYLENOL) 500 MG tablet Take 500 mg by mouth every 6 (six) hours as needed for mild pain.    Historical Provider, MD  albuterol (PROVENTIL HFA;VENTOLIN HFA) 108 (90 BASE) MCG/ACT inhaler Inhale 2 puffs into the lungs every 4 (four) hours as needed for wheezing or shortness of breath. 04/19/15   Renato Shin, MD  albuterol (PROVENTIL) (2.5 MG/3ML) 0.083% nebulizer solution Take 3 mLs (2.5 mg total)  by nebulization every 4 (four) hours as needed for wheezing or shortness of breath. 12/08/13   Robbie Lis, MD  allopurinol (ZYLOPRIM) 100 MG tablet Take 1 tablet (100 mg total) by mouth daily. 07/21/15   Renato Shin, MD  allopurinol (ZYLOPRIM) 100 MG tablet TAKE 1 TABLET BY MOUTH EVERY DAY 08/14/15   Renato Shin, MD  amLODipine (NORVASC) 10 MG tablet Take 1 tablet (10 mg total) by mouth daily. 07/21/15   Renato Shin, MD  aspirin EC 81 MG tablet Take 1 tablet (81 mg total) by mouth daily. 03/16/15   Dayna N Dunn, PA-C  atorvastatin (LIPITOR) 40 MG tablet TAKE 1 TABLET BY MOUTH DAILY AT 6 PM 07/21/15   Renato Shin, MD  calcitRIOL (ROCALTROL) 0.25 MCG capsule Take 1 capsule (0.25 mcg total) by mouth daily. 07/21/15   Renato Shin, MD  Cephalexin 500 MG tablet Take 1 tablet (500 mg total) by mouth 2 (two) times daily. 07/21/15   Renato Shin, MD  clopidogrel (PLAVIX) 75 MG tablet Take 75 mg by mouth daily.    Historical Provider, MD  colestipol (COLESTID) 1 g tablet Take 3 tablets (3 g total) by mouth daily. 07/21/15   Renato Shin, MD  donepezil (ARICEPT) 5 MG tablet Take 1 tablet (5 mg total) by mouth at bedtime. 07/21/15   Renato Shin, MD  feeding supplement (GLUCERNA SHAKE) LIQD Take 237 mLs by mouth every morning.     Historical Provider, MD  Fluticasone-Salmeterol (ADVAIR DISKUS) 100-50 MCG/DOSE AEPB Inhale 1 puff into the lungs 2 (two) times daily. 04/19/15   Renato Shin, MD  furosemide (LASIX) 40 MG tablet Take 40 mg by mouth daily.  Reported on 07/28/2015 04/19/15   Historical Provider, MD  gabapentin (NEURONTIN) 100 MG capsule TAKE 1 CAPSULE BY MOUTH EVERY NIGHT AT BEDTIME 07/24/15   Renato Shin, MD  hydrALAZINE (APRESOLINE) 25 MG tablet Take 1 tablet (25 mg total) by mouth 3 (three) times daily. 07/21/15   Renato Shin, MD  HYDROcodone-acetaminophen (NORCO) 10-325 MG tablet Take 1 tablet by mouth every 6 (six) hours as needed for moderate pain. 05/12/15   Nita Sells, MD  insulin NPH-regular Human (NOVOLIN 70/30) (70-30) 100 UNIT/ML injection Inject 15 Units into the skin daily with breakfast. 06/21/15   Renato Shin, MD  isosorbide mononitrate (IMDUR) 60 MG 24 hr tablet Take 60 mg by mouth daily. 04/05/15   Historical Provider, MD  latanoprost (XALATAN) 0.005 % ophthalmic solution Place 1 drop into both eyes at bedtime.    Historical Provider, MD  nitroGLYCERIN (NITROSTAT) 0.4 MG SL tablet Place 0.4 mg under the tongue every 5 (five) minutes as needed for chest pain.     Historical Provider, MD  ONE TOUCH ULTRA TEST test strip USE TO TEST BLOOD SUGAR TWICE DAILY 07/14/15   Renato Shin, MD  pantoprazole (PROTONIX) 40 MG tablet Take 1 tablet (40 mg total) by mouth 2 (two) times daily. 07/21/15   Renato Shin, MD  pantoprazole (PROTONIX) 40 MG tablet TAKE 1 TABLET BY MOUTH TWICE DAILY 08/07/15   Renato Shin, MD  polyethylene glycol (MIRALAX / GLYCOLAX) packet Take 17 g by mouth daily. 08/17/15   Billy Fischer, MD  RANEXA 500 MG 12 hr tablet TAKE 1 TABLET BY MOUTH EVERY 12 HOURS 07/21/15   Gildardo Cranker, DO  sertraline (ZOLOFT) 100 MG tablet Take 1 tablet (100 mg total) by mouth daily. 07/21/15   Renato Shin, MD   BP 148/62 mmHg  Pulse 83  Temp(Src) 99 F (37.2  C) (Oral)  Resp 14  SpO2 100% Physical Exam CONSTITUTIONAL: Well developed/well nourished HEAD: Normocephalic/atraumatic ENMT: Mucous membranes moist NECK: supple no meningeal signs SPINE/BACK:entire spine nontender CV: S1/S2 noted, no murmurs/rubs/gallops  noted LUNGS: Lungs are clear to auscultation bilaterally, no apparent distress ABDOMEN: soft, moderate RLQ tenderness, no rebound or guarding, bowel sounds noted throughout abdomen RECTAL: significant fecal impaction noted. Female chaperone present.  No blood or melena noted on exam NEURO: Pt is awake/alert/appropriate, moves all extremitiesx4.  No facial droop.   EXTREMITIES: pulses normal/equal, full ROM. Prosthetic to LLE.  SKIN: warm, color normal PSYCH: no abnormalities of mood noted, alert and oriented to situation  ED Course  Fecal disimpaction Date/Time: 08/17/2015 11:41 PM Performed by: Ripley Fraise Authorized by: Ripley Fraise Consent: Verbal consent obtained. Patient tolerance: Patient tolerated the procedure well with no immediate complications Comments: Fecal disimpaction performed with nurse tech present as chaperone Significant stool was extracted Pt tolerated well, without any complications   DIAGNOSTIC STUDIES: Oxygen Saturation is 100% on RA, normal by my interpretation.    COORDINATION OF CARE: 11:35 PM-Discussed treatment plan which includes CBC with differential and CMP with pt at bedside and pt agreed to plan.   12:21 AM Pt is much improved after disimpaction She is watching TV She has no focal ABD Tenderness Will perform PO challenge  1:29 AM Pt taking PO, sleeping on my repeat evaluation No focal abd tenderness I don't feel further imaging required We discussed strict return precautions Pt/family agreeable with plan  Labs Review Labs Reviewed  COMPREHENSIVE METABOLIC PANEL - Abnormal; Notable for the following:    Chloride 95 (*)    Glucose, Bld 147 (*)    Creatinine, Ser 2.62 (*)    GFR calc non Af Amer 16 (*)    GFR calc Af Amer 18 (*)    Anion gap 16 (*)    All other components within normal limits  CBC WITH DIFFERENTIAL/PLATELET    Imaging Review Dg Abd 2 Views  08/17/2015  CLINICAL DATA:  Constipation EXAM: ABDOMEN - 2 VIEW  COMPARISON:  None. FINDINGS: Suboptimal study due to patient's large body habitus. Mild degenerative changes lumbar spine. Status postcholecystectomy. Normal small bowel gas pattern. Moderate colonic stool in right colon. There is levoscoliosis of the lumbar spine. IMPRESSION: Normal small bowel gas pattern. Degenerative changes lumbar spine. Moderate stool in right colon. Postcholecystectomy surgical clips are noted. Levoscoliosis lumbar spine. Electronically Signed   By: Lahoma Crocker M.D.   On: 08/17/2015 19:14      MDM   Final diagnoses:  Constipation, unspecified constipation type    Nursing notes including past medical history and social history reviewed and considered in documentation Previous records reviewed and considered    I personally performed the services described in this documentation, which was scribed in my presence. The recorded information has been reviewed and is accurate.       Ripley Fraise, MD 08/18/15 0130

## 2015-08-18 ENCOUNTER — Other Ambulatory Visit: Payer: Self-pay | Admitting: Internal Medicine

## 2015-08-18 ENCOUNTER — Other Ambulatory Visit: Payer: Self-pay | Admitting: Endocrinology

## 2015-08-18 NOTE — Telephone Encounter (Signed)
Please advise if ok to refill. Medication is not on the current med list.

## 2015-08-18 NOTE — Discharge Instructions (Signed)

## 2015-08-20 ENCOUNTER — Other Ambulatory Visit: Payer: Self-pay | Admitting: Endocrinology

## 2015-08-26 ENCOUNTER — Other Ambulatory Visit: Payer: Self-pay | Admitting: Internal Medicine

## 2015-08-28 ENCOUNTER — Other Ambulatory Visit: Payer: Self-pay | Admitting: Internal Medicine

## 2015-09-04 ENCOUNTER — Other Ambulatory Visit: Payer: Self-pay | Admitting: Endocrinology

## 2015-09-09 ENCOUNTER — Other Ambulatory Visit: Payer: Self-pay | Admitting: Internal Medicine

## 2015-09-11 ENCOUNTER — Other Ambulatory Visit: Payer: Self-pay | Admitting: Internal Medicine

## 2015-09-17 ENCOUNTER — Other Ambulatory Visit: Payer: Self-pay | Admitting: Endocrinology

## 2015-09-17 NOTE — Progress Notes (Signed)
Subjective:    Patient ID: [name], female    DOB: 10/20/1929, 82 y.o.   MRN: [id number]  HPI The state of at least three ongoing medical problems is addressed today, with interval history of each noted here: Pt returns for f/u of diabetes mellitus: DM type: Insulin-requiring type 2 Dx'ed: [id number] Complications: polyneuropathy, renal failure, CAD, and PAD.  Therapy: insulin since 1991 GDM: never DKA: never Severe hypoglycemia: last episode was in 2014 Pancreatitis: never Other: therapy is limited by pt's request for least expensive insulin, and by her need for a simple regimen; husband administers insulin to her. Interval history:  husband says he gives her 14-15 units qam.  i asked husband, who says she has mild hypoglycemia approx once per week.   no cbg record, but states cbg's vary from 39-100's.  It was low after a missed meal.   husb says he changed colestipol back to zetia, due to cost (he says cardiol gave them samples).  She tolerates well.  GERD: pt says well-controlled.   Past Medical History  Diagnosis Date  . Uterine cancer ([place])   . Diabetes mellitus     type II; peripheral neuropathy  . Hypertension   . GERD (gastroesophageal reflux disease)   . Peripheral vascular disease ([place])     a. s/p L BKA 08/2012.  . Obesity   . Dyslipidemia   . Chronic combined systolic and diastolic CHF (congestive heart failure) (HCC)     a. EF 50-55%, mild LVH and grade 1 diast. Dysfxn b. Grade 3 Diastolic Dysfunction [id number]. b. 02/2015: EF 45-50% by cath.  . Osteoarthritis cervical spine   . DM retinopathy ([place])   . Sleep apnea     with CPAP  . History of shingles   . COPD (chronic obstructive pulmonary disease) ([place])   . Depression   . Peptic ulcer disease     duodenal  . Peptic stricture of esophagus   . Hiatal hernia   . Diverticulosis   . Arthritis   . CAD (coronary artery disease)     a. s/p multiple caths with nonobs CAD;   b. cath 1/10: pLAD 20%, mLAD 40%,  pCFX 20%, mCFX 40%, pRCA 60-70%;   c.  Myoview 06/02/12: Low anterior wall scar, no ischemia, EF 37%. d. cath 06/20/2014 70% mid LCx, medical therapy, high risk for PCI due to close proximity to large OM e. cath 03/07/15 pro RCA 50% & pro to mid Cx 75%, both stable, LVEDP 33-9mm Hg-> medical management  . Cardiomyopathy ([place])     a. Echo 05/31/12: EF 40-45%. b. Echo 06/2014: EF 50-55%. c. Cath 02/2015: EF 45-50%.  . Asthma     Mild  . Pruritic condition     Idiopathic  . Zoster   . Noncompliance   . CKD (chronic kidney disease), stage III     Dr. Lorrene Reid  . Bradycardia     a. Not on BB due to this.  . Shortness of breath dyspnea     Past Surgical History  Procedure Laterality Date  . Tubal ligation  1967  . Knee arthroscopy  10/1998    Left  . Craniotomy  1997    Left for SDH  . Cataract extraction, bilateral  2005  . Hernia repair    . Esophagogastroduodenoscopy  04/04/2004  . Spine surgery      C-spine and lumbar surgery  . Cholecystectomy  2010  . Cardiac catheterization    . Dexa  7/05  .  Amputation Left 09/04/2012    Procedure: AMPUTATION BELOW KNEE;  Surgeon: Angelia Mould, MD;  Location: [place];  Service: Vascular;  Laterality: Left;  . Abdominal angiogram N/A 08/31/2012    Procedure: ABDOMINAL ANGIOGRAM with runoff poss intervention;  Surgeon: Angelia Mould, MD;  Location: [facility] CATH LAB;  Service: Cardiovascular;  Laterality: N/A;  . Tubal ligation    . Left heart catheterization with coronary angiogram N/A 06/22/2014    Procedure: LEFT HEART CATHETERIZATION WITH CORONARY ANGIOGRAM;  Surgeon: Burnell Blanks, MD;  Location: [facility] CATH LAB;  Service: Cardiovascular;  Laterality: N/A;  . Eye surgery    . Brain surgery      1997 blood clot on the brain, then had to relieve fluid on the brain  . Multiple extractions with alveoloplasty N/A 08/01/2014    Procedure: EXTRACTIONS OF TEETH NUMBERS 7 8 9 10 11  AND 19 AND ALVEOLOPLASTY UPPER LEFT AND RIGHT QUADRANT;   Surgeon: Isac Caddy, DDS;  Location: [place];  Service: Oral Surgery;  Laterality: N/A;  . Cardiac catheterization N/A 03/07/2015    Procedure: Left Heart Cath and Coronary Angiography;  Surgeon: Leonie Man, MD;  Location: [place] CV LAB;  Service: Cardiovascular;  Laterality: N/A;  . Av fistula placement Left 05/10/2015    Procedure: CREATION OF LEFT UPPER ARM ARTERIOVENOUS (AV) FISTULA ;  Surgeon: Elam Dutch, MD;  Location: [facility] OR;  Service: Vascular;  Laterality: Left;    Social History   Social History  . Marital Status: Married    Spouse Name: N/A  . Number of Children: 7  . Years of Education: N/A   Occupational History  . Retired    Social History Main Topics  . Smoking status: Never Smoker   . Smokeless tobacco: Never Used  . Alcohol Use: No     Comment: rare  . Drug Use: No  . Sexual Activity: No   Other Topics Concern  . Not on file   Social History Narrative    Current Outpatient Prescriptions on File Prior to Visit  Medication Sig Dispense Refill  . acetaminophen (TYLENOL) 500 MG tablet Take 500 mg by mouth every 6 (six) hours as needed for mild pain.    Marland Kitchen albuterol (PROVENTIL HFA;VENTOLIN HFA) 108 (90 BASE) MCG/ACT inhaler Inhale 2 puffs into the lungs every 4 (four) hours as needed for wheezing or shortness of breath. 1 Inhaler 3  . albuterol (PROVENTIL) (2.5 MG/3ML) 0.083% nebulizer solution Take 3 mLs (2.5 mg total) by nebulization every 4 (four) hours as needed for wheezing or shortness of breath. 75 mL 12  . allopurinol (ZYLOPRIM) 100 MG tablet Take 1 tablet (100 mg total) by mouth daily. 90 tablet 3  . amLODipine (NORVASC) 10 MG tablet Take 1 tablet (10 mg total) by mouth daily. 90 tablet 3  . aspirin EC 81 MG tablet Take 1 tablet (81 mg total) by mouth daily. 90 tablet 3  . atorvastatin (LIPITOR) 40 MG tablet TAKE 1 TABLET BY MOUTH DAILY AT 6 PM 90 tablet 3  . calcitRIOL (ROCALTROL) 0.25 MCG capsule Take 1 capsule (0.25 mcg total) by  mouth daily. 90 capsule 3  . clopidogrel (PLAVIX) 75 MG tablet Take 75 mg by mouth daily.    Marland Kitchen donepezil (ARICEPT) 5 MG tablet Take 1 tablet (5 mg total) by mouth at bedtime. 90 tablet 3  . ezetimibe (ZETIA) 10 MG tablet TAKE 1 TABLET BY MOUTH EVERY MORNING 30 tablet 0  . feeding supplement (GLUCERNA  SHAKE) LIQD Take 237 mLs by mouth every morning.     . Fluticasone-Salmeterol (ADVAIR DISKUS) 100-50 MCG/DOSE AEPB Inhale 1 puff into the lungs 2 (two) times daily. 1 each 11  . gabapentin (NEURONTIN) 100 MG capsule TAKE 1 CAPSULE BY MOUTH EVERY NIGHT AT BEDTIME 30 capsule 0  . hydrALAZINE (APRESOLINE) 25 MG tablet Take 1 tablet (25 mg total) by mouth 3 (three) times daily. 270 tablet 3  . isosorbide mononitrate (IMDUR) 60 MG 24 hr tablet Take 60 mg by mouth daily.  5  . latanoprost (XALATAN) 0.005 % ophthalmic solution Place 1 drop into both eyes at bedtime.    . nitroGLYCERIN (NITROSTAT) 0.4 MG SL tablet Place 0.4 mg under the tongue every 5 (five) minutes as needed for chest pain.     . ONE TOUCH ULTRA TEST test strip USE TO TEST BLOOD SUGAR TWICE DAILY 100 each 2  . pantoprazole (PROTONIX) 40 MG tablet Take 1 tablet (40 mg total) by mouth 2 (two) times daily. 180 tablet 2  . RANEXA 500 MG 12 hr tablet TAKE 1 TABLET BY MOUTH EVERY 12 HOURS 60 tablet 1  . sertraline (ZOLOFT) 100 MG tablet Take 1 tablet (100 mg total) by mouth daily. 90 tablet 2   No current facility-administered medications on file prior to visit.    Allergies  Allergen Reactions  . Iohexol Itching and Rash     Code: RASH, Desc: [place] ON PT'S CHART ALLERGIC TO IV DYE 09/04/07/RM, Onset Date: [id number]     Family History  Problem Relation Age of Onset  . Cancer - Other Mother     "Stomach" Cancer  . Diabetes Mother   . Heart disease Mother   . Stomach cancer Mother   . Hypertension Mother   . Lymphoma Father   . Hypertension Father   . Kidney disease Paternal Grandmother   . Asthma Other   . Diabetes Sister    . Rheum arthritis Mother   . Rheum arthritis Father   . Heart attack Neg Hx   . Stroke Paternal Grandmother     BP 140/52 mmHg  Pulse 66  Temp(Src) 97.9 F (36.6 C)  Resp 14  Ht 5\' 6"  (1.676 m)  Wt   SpO2 96%  Review of Systems Denies LOC and weight change.      Objective:   Physical Exam VITAL SIGNS:  See vs page GENERAL: no distress.  In wheelchair. Ext: no leg edema  A1c=6.2% Lab Results  Component Value Date   CHOL 118 08/18/2014   HDL 68.30 08/18/2014   LDLCALC 31 08/18/2014   LDLDIRECT 123.0 07/06/2010   TRIG 96.0 08/18/2014   CHOLHDL 2 08/18/2014      Assessment & Plan:  DM: overcontrolled.   GERD: well-controlled.  i decreased protonix to qd, on a trial basis.   Dyslipidemia: on rx.  We'll recheck with next labs.    Patient is advised the following: Patient Instructions  Please reduce the insulin to 8 units each morning, and take it no matter what your blood sugar is.   check your blood sugar twice a day.  vary the time of day when you check, between before the 3 meals, and at bedtime.  also check if you have symptoms of your blood sugar being too high or too low.  please keep a record of the readings and bring it to your next appointment here (or you can bring the meter itself).  You can write it on any piece  of paper.  please call us sooner if your blood sugar goes below 70, or if you have a lot of readings over 200.  Please come back for a follow-up appointment in 3 months.

## 2015-09-18 ENCOUNTER — Ambulatory Visit (INDEPENDENT_AMBULATORY_CARE_PROVIDER_SITE_OTHER): Payer: Medicare Other | Admitting: Endocrinology

## 2015-09-18 ENCOUNTER — Ambulatory Visit: Payer: Medicare Other | Admitting: Endocrinology

## 2015-09-18 ENCOUNTER — Encounter: Payer: Self-pay | Admitting: Endocrinology

## 2015-09-18 VITALS — BP 140/52 | HR 66 | Temp 97.9°F | Resp 14 | Ht 66.0 in

## 2015-09-18 DIAGNOSIS — E119 Type 2 diabetes mellitus without complications: Secondary | ICD-10-CM | POA: Insufficient documentation

## 2015-09-18 DIAGNOSIS — IMO0002 Reserved for concepts with insufficient information to code with codable children: Secondary | ICD-10-CM

## 2015-09-18 DIAGNOSIS — E1165 Type 2 diabetes mellitus with hyperglycemia: Secondary | ICD-10-CM | POA: Diagnosis not present

## 2015-09-18 DIAGNOSIS — N184 Chronic kidney disease, stage 4 (severe): Secondary | ICD-10-CM | POA: Diagnosis not present

## 2015-09-18 DIAGNOSIS — Z794 Long term (current) use of insulin: Secondary | ICD-10-CM | POA: Diagnosis not present

## 2015-09-18 DIAGNOSIS — E1122 Type 2 diabetes mellitus with diabetic chronic kidney disease: Secondary | ICD-10-CM | POA: Diagnosis not present

## 2015-09-18 LAB — POCT GLYCOSYLATED HEMOGLOBIN (HGB A1C): Hemoglobin A1C: 6.2

## 2015-09-18 MED ORDER — INSULIN NPH ISOPHANE & REGULAR (70-30) 100 UNIT/ML ~~LOC~~ SUSP: 8.0000 [IU] | Freq: Every day | SUBCUTANEOUS | Status: DC

## 2015-09-18 NOTE — Patient Instructions (Addendum)
Please reduce the insulin to 8 units each morning, and take it no matter what your blood sugar is.   check your blood sugar twice a day.  vary the time of day when you check, between before the 3 meals, and at bedtime.  also check if you have symptoms of your blood sugar being too high or too low.  please keep a record of the readings and bring it to your next appointment here (or you can bring the meter itself).  You can write it on any piece of paper.  please call us sooner if your blood sugar goes below 70, or if you have a lot of readings over 200.  Please come back for a follow-up appointment in 3 months.

## 2015-09-20 ENCOUNTER — Other Ambulatory Visit: Payer: Self-pay | Admitting: Endocrinology

## 2015-09-20 ENCOUNTER — Other Ambulatory Visit: Payer: Self-pay | Admitting: Internal Medicine

## 2015-09-26 ENCOUNTER — Other Ambulatory Visit: Payer: Self-pay | Admitting: Cardiology

## 2015-09-26 ENCOUNTER — Other Ambulatory Visit: Payer: Self-pay | Admitting: Endocrinology

## 2015-09-27 ENCOUNTER — Ambulatory Visit: Payer: Medicare Other | Admitting: Vascular Surgery

## 2015-09-27 ENCOUNTER — Encounter (HOSPITAL_COMMUNITY): Payer: Medicare Other

## 2015-10-04 ENCOUNTER — Other Ambulatory Visit: Payer: Self-pay | Admitting: Endocrinology

## 2015-10-15 ENCOUNTER — Other Ambulatory Visit: Payer: Self-pay | Admitting: Endocrinology

## 2015-10-17 ENCOUNTER — Inpatient Hospital Stay (HOSPITAL_COMMUNITY)
Admission: EM | Admit: 2015-10-17 | Discharge: 2015-10-19 | DRG: 313 | Disposition: A | Discharge status: Home / Self Care | Payer: Medicare Other | Attending: Internal Medicine | Admitting: Internal Medicine

## 2015-10-17 ENCOUNTER — Encounter (HOSPITAL_COMMUNITY): Payer: Self-pay

## 2015-10-17 ENCOUNTER — Emergency Department (HOSPITAL_COMMUNITY): Payer: Medicare Other

## 2015-10-17 ENCOUNTER — Other Ambulatory Visit: Payer: Self-pay | Admitting: Endocrinology

## 2015-10-17 DIAGNOSIS — E785 Hyperlipidemia, unspecified: Secondary | ICD-10-CM | POA: Diagnosis present

## 2015-10-17 DIAGNOSIS — Z89512 Acquired absence of left leg below knee: Secondary | ICD-10-CM

## 2015-10-17 DIAGNOSIS — Z8542 Personal history of malignant neoplasm of other parts of uterus: Secondary | ICD-10-CM

## 2015-10-17 DIAGNOSIS — I739 Peripheral vascular disease, unspecified: Secondary | ICD-10-CM | POA: Diagnosis present

## 2015-10-17 DIAGNOSIS — I4581 Long QT syndrome: Secondary | ICD-10-CM

## 2015-10-17 DIAGNOSIS — I10 Essential (primary) hypertension: Secondary | ICD-10-CM | POA: Diagnosis present

## 2015-10-17 DIAGNOSIS — I214 Non-ST elevation (NSTEMI) myocardial infarction: Secondary | ICD-10-CM | POA: Diagnosis present

## 2015-10-17 DIAGNOSIS — I5032 Chronic diastolic (congestive) heart failure: Secondary | ICD-10-CM | POA: Diagnosis not present

## 2015-10-17 DIAGNOSIS — E1122 Type 2 diabetes mellitus with diabetic chronic kidney disease: Secondary | ICD-10-CM | POA: Diagnosis present

## 2015-10-17 DIAGNOSIS — N186 End stage renal disease: Secondary | ICD-10-CM | POA: Diagnosis not present

## 2015-10-17 DIAGNOSIS — E119 Type 2 diabetes mellitus without complications: Secondary | ICD-10-CM

## 2015-10-17 DIAGNOSIS — I5042 Chronic combined systolic (congestive) and diastolic (congestive) heart failure: Secondary | ICD-10-CM | POA: Diagnosis present

## 2015-10-17 DIAGNOSIS — R079 Chest pain, unspecified: Secondary | ICD-10-CM | POA: Diagnosis not present

## 2015-10-17 DIAGNOSIS — I2583 Coronary atherosclerosis due to lipid rich plaque: Secondary | ICD-10-CM | POA: Diagnosis present

## 2015-10-17 DIAGNOSIS — I2510 Atherosclerotic heart disease of native coronary artery without angina pectoris: Secondary | ICD-10-CM | POA: Diagnosis present

## 2015-10-17 DIAGNOSIS — E1151 Type 2 diabetes mellitus with diabetic peripheral angiopathy without gangrene: Secondary | ICD-10-CM | POA: Diagnosis present

## 2015-10-17 DIAGNOSIS — R9431 Abnormal electrocardiogram [ECG] [EKG]: Secondary | ICD-10-CM | POA: Diagnosis present

## 2015-10-17 DIAGNOSIS — J449 Chronic obstructive pulmonary disease, unspecified: Secondary | ICD-10-CM | POA: Diagnosis present

## 2015-10-17 DIAGNOSIS — N2581 Secondary hyperparathyroidism of renal origin: Secondary | ICD-10-CM | POA: Diagnosis present

## 2015-10-17 DIAGNOSIS — R778 Other specified abnormalities of plasma proteins: Secondary | ICD-10-CM | POA: Diagnosis present

## 2015-10-17 DIAGNOSIS — R7989 Other specified abnormal findings of blood chemistry: Secondary | ICD-10-CM | POA: Diagnosis not present

## 2015-10-17 DIAGNOSIS — R2681 Unsteadiness on feet: Secondary | ICD-10-CM | POA: Diagnosis present

## 2015-10-17 DIAGNOSIS — Z992 Dependence on renal dialysis: Secondary | ICD-10-CM

## 2015-10-17 DIAGNOSIS — Z794 Long term (current) use of insulin: Secondary | ICD-10-CM

## 2015-10-17 DIAGNOSIS — E876 Hypokalemia: Secondary | ICD-10-CM | POA: Diagnosis present

## 2015-10-17 DIAGNOSIS — R0789 Other chest pain: Secondary | ICD-10-CM | POA: Diagnosis not present

## 2015-10-17 DIAGNOSIS — E11319 Type 2 diabetes mellitus with unspecified diabetic retinopathy without macular edema: Secondary | ICD-10-CM | POA: Diagnosis present

## 2015-10-17 DIAGNOSIS — Z7902 Long term (current) use of antithrombotics/antiplatelets: Secondary | ICD-10-CM

## 2015-10-17 DIAGNOSIS — G4733 Obstructive sleep apnea (adult) (pediatric): Secondary | ICD-10-CM | POA: Diagnosis present

## 2015-10-17 DIAGNOSIS — I132 Hypertensive heart and chronic kidney disease with heart failure and with stage 5 chronic kidney disease, or end stage renal disease: Secondary | ICD-10-CM | POA: Diagnosis present

## 2015-10-17 DIAGNOSIS — K219 Gastro-esophageal reflux disease without esophagitis: Secondary | ICD-10-CM | POA: Diagnosis present

## 2015-10-17 LAB — CBC
HEMATOCRIT: 37 % (ref 36.0–46.0)
Hemoglobin: 12.3 g/dL (ref 12.0–15.0)
MCH: 30.4 pg (ref 26.0–34.0)
MCHC: 33.2 g/dL (ref 30.0–36.0)
MCV: 91.4 fL (ref 78.0–100.0)
Platelets: 277 10*3/uL (ref 150–400)
RBC: 4.05 MIL/uL (ref 3.87–5.11)
RDW: 14.1 % (ref 11.5–15.5)
WBC: 9.7 10*3/uL (ref 4.0–10.5)

## 2015-10-17 LAB — BASIC METABOLIC PANEL
Anion gap: 11 (ref 5–15)
BUN: 31 mg/dL — ABNORMAL HIGH (ref 6–20)
CHLORIDE: 99 mmol/L — AB (ref 101–111)
CO2: 28 mmol/L (ref 22–32)
Calcium: 9.2 mg/dL (ref 8.9–10.3)
Creatinine, Ser: 3.3 mg/dL — ABNORMAL HIGH (ref 0.44–1.00)
GFR calc non Af Amer: 12 mL/min — ABNORMAL LOW (ref >60)
GFR, EST AFRICAN AMERICAN: 14 mL/min — AB (ref >60)
Glucose, Bld: 174 mg/dL — ABNORMAL HIGH (ref 65–99)
POTASSIUM: 3.4 mmol/L — AB (ref 3.5–5.1)
SODIUM: 138 mmol/L (ref 135–145)

## 2015-10-17 LAB — CBG MONITORING, ED: GLUCOSE-CAPILLARY: 151 mg/dL — AB (ref 65–99)

## 2015-10-17 LAB — MRSA PCR SCREENING: MRSA by PCR: NEGATIVE

## 2015-10-17 LAB — I-STAT TROPONIN, ED: Troponin i, poc: 0.41 ng/mL (ref 0.00–0.08)

## 2015-10-17 LAB — TROPONIN I: Troponin I: 0.37 ng/mL — ABNORMAL HIGH (ref <0.031)

## 2015-10-17 LAB — GLUCOSE, CAPILLARY: Glucose-Capillary: 154 mg/dL — ABNORMAL HIGH (ref 65–99)

## 2015-10-17 MED ORDER — ACETAMINOPHEN 325 MG PO TABS: 650.0000 mg | ORAL_TABLET | ORAL | Status: DC | PRN

## 2015-10-17 MED ORDER — ASPIRIN EC 81 MG PO TBEC
81.0000 mg | DELAYED_RELEASE_TABLET | Freq: Every day | ORAL | Status: DC
  Administered 2015-10-18 – 2015-10-19 (×2): 81 mg via ORAL
  Filled 2015-10-17 (×2): qty 1

## 2015-10-17 MED ORDER — INSULIN ASPART 100 UNIT/ML ~~LOC~~ SOLN: 0.0000 [IU] | Freq: Every day | SUBCUTANEOUS | Status: DC

## 2015-10-17 MED ORDER — CALCITRIOL 0.25 MCG PO CAPS
0.2500 ug | ORAL_CAPSULE | Freq: Every day | ORAL | Status: DC
  Administered 2015-10-18 – 2015-10-19 (×2): 0.25 ug via ORAL
  Filled 2015-10-17 (×2): qty 1

## 2015-10-17 MED ORDER — LATANOPROST 0.005 % OP SOLN
1.0000 [drp] | Freq: Every day | OPHTHALMIC | Status: DC
  Administered 2015-10-17 – 2015-10-18 (×2): 1 [drp] via OPHTHALMIC
  Filled 2015-10-17: qty 2.5

## 2015-10-17 MED ORDER — ONDANSETRON HCL 4 MG/2ML IJ SOLN
4.0000 mg | Freq: Four times a day (QID) | INTRAMUSCULAR | Status: DC | PRN
Start: 2015-10-17 — End: 2015-10-18

## 2015-10-17 MED ORDER — HEPARIN (PORCINE) IN NACL 100-0.45 UNIT/ML-% IJ SOLN
850.0000 [IU]/h | INTRAMUSCULAR | Status: DC
  Administered 2015-10-17: 850 [IU]/h via INTRAVENOUS
  Administered 2015-10-18: 750 [IU]/h via INTRAVENOUS
  Filled 2015-10-17 (×2): qty 250

## 2015-10-17 MED ORDER — ALBUTEROL SULFATE (2.5 MG/3ML) 0.083% IN NEBU: 2.5000 mg | INHALATION_SOLUTION | RESPIRATORY_TRACT | Status: DC | PRN

## 2015-10-17 MED ORDER — INSULIN ASPART 100 UNIT/ML ~~LOC~~ SOLN
0.0000 [IU] | Freq: Three times a day (TID) | SUBCUTANEOUS | Status: DC
  Administered 2015-10-17: 2 [IU] via SUBCUTANEOUS
  Administered 2015-10-18: 3 [IU] via SUBCUTANEOUS
  Administered 2015-10-18: 1 [IU] via SUBCUTANEOUS
  Administered 2015-10-18: 2 [IU] via SUBCUTANEOUS
  Administered 2015-10-19 (×2): 3 [IU] via SUBCUTANEOUS
  Filled 2015-10-17: qty 1

## 2015-10-17 MED ORDER — NITROGLYCERIN 0.4 MG SL SUBL: 0.4000 mg | SUBLINGUAL_TABLET | SUBLINGUAL | Status: DC | PRN

## 2015-10-17 MED ORDER — MOMETASONE FURO-FORMOTEROL FUM 100-5 MCG/ACT IN AERO
2.0000 | INHALATION_SPRAY | Freq: Two times a day (BID) | RESPIRATORY_TRACT | Status: DC
  Administered 2015-10-17 – 2015-10-18 (×3): 2 via RESPIRATORY_TRACT
  Filled 2015-10-17 (×2): qty 8.8

## 2015-10-17 MED ORDER — ATORVASTATIN CALCIUM 40 MG PO TABS
40.0000 mg | ORAL_TABLET | Freq: Every day | ORAL | Status: DC
  Administered 2015-10-18: 40 mg via ORAL
  Filled 2015-10-17 (×2): qty 1

## 2015-10-17 MED ORDER — GLUCERNA SHAKE PO LIQD
237.0000 mL | Freq: Every morning | ORAL | Status: DC
  Administered 2015-10-18 – 2015-10-19 (×2): 237 mL via ORAL

## 2015-10-17 MED ORDER — NITROGLYCERIN 2 % TD OINT
0.5000 [in_us] | TOPICAL_OINTMENT | Freq: Once | TRANSDERMAL | Status: AC
  Administered 2015-10-17: 0.5 [in_us] via TOPICAL
  Filled 2015-10-17: qty 1

## 2015-10-17 MED ORDER — RANOLAZINE ER 500 MG PO TB12
500.0000 mg | ORAL_TABLET | Freq: Two times a day (BID) | ORAL | Status: DC
  Administered 2015-10-17 – 2015-10-19 (×4): 500 mg via ORAL
  Filled 2015-10-17 (×4): qty 1

## 2015-10-17 MED ORDER — PANTOPRAZOLE SODIUM 40 MG PO TBEC
40.0000 mg | DELAYED_RELEASE_TABLET | Freq: Two times a day (BID) | ORAL | Status: DC
  Administered 2015-10-17 – 2015-10-19 (×4): 40 mg via ORAL
  Filled 2015-10-17 (×4): qty 1

## 2015-10-17 MED ORDER — HEPARIN BOLUS VIA INFUSION
4000.0000 [IU] | Freq: Once | INTRAVENOUS | Status: AC
  Administered 2015-10-17: 4000 [IU] via INTRAVENOUS
  Filled 2015-10-17: qty 4000

## 2015-10-17 MED ORDER — INSULIN NPH (HUMAN) (ISOPHANE) 100 UNIT/ML ~~LOC~~ SUSP
8.0000 [IU] | Freq: Every day | SUBCUTANEOUS | Status: DC
  Administered 2015-10-18 – 2015-10-19 (×2): 8 [IU] via SUBCUTANEOUS
  Filled 2015-10-17: qty 10

## 2015-10-17 MED ORDER — ISOSORBIDE MONONITRATE ER 60 MG PO TB24
60.0000 mg | ORAL_TABLET | Freq: Every day | ORAL | Status: DC
  Administered 2015-10-18 – 2015-10-19 (×2): 60 mg via ORAL
  Filled 2015-10-17 (×2): qty 1

## 2015-10-17 MED ORDER — HYDRALAZINE HCL 25 MG PO TABS
25.0000 mg | ORAL_TABLET | Freq: Three times a day (TID) | ORAL | Status: DC
  Administered 2015-10-18 – 2015-10-19 (×3): 25 mg via ORAL
  Filled 2015-10-17 (×4): qty 1

## 2015-10-17 MED ORDER — AMLODIPINE BESYLATE 10 MG PO TABS
10.0000 mg | ORAL_TABLET | Freq: Every day | ORAL | Status: DC
  Administered 2015-10-18 – 2015-10-19 (×2): 10 mg via ORAL
  Filled 2015-10-17 (×2): qty 1

## 2015-10-17 MED ORDER — ISOSORBIDE MONONITRATE ER 60 MG PO TB24: 90.0000 mg | ORAL_TABLET | Freq: Every day | ORAL | Status: DC

## 2015-10-17 MED ORDER — EZETIMIBE 10 MG PO TABS
10.0000 mg | ORAL_TABLET | Freq: Every morning | ORAL | Status: DC
  Administered 2015-10-18 – 2015-10-19 (×2): 10 mg via ORAL
  Filled 2015-10-17 (×2): qty 1

## 2015-10-17 MED ORDER — INSULIN ASPART 100 UNIT/ML ~~LOC~~ SOLN: 0.0000 [IU] | Freq: Three times a day (TID) | SUBCUTANEOUS | Status: DC

## 2015-10-17 MED ORDER — DONEPEZIL HCL 5 MG PO TABS
5.0000 mg | ORAL_TABLET | Freq: Every day | ORAL | Status: DC
  Administered 2015-10-17 – 2015-10-18 (×2): 5 mg via ORAL
  Filled 2015-10-17 (×2): qty 1

## 2015-10-17 MED ORDER — MORPHINE SULFATE (PF) 2 MG/ML IV SOLN: 2.0000 mg | INTRAVENOUS | Status: DC | PRN

## 2015-10-17 MED ORDER — CLOPIDOGREL BISULFATE 75 MG PO TABS
75.0000 mg | ORAL_TABLET | Freq: Every day | ORAL | Status: DC
  Administered 2015-10-18 – 2015-10-19 (×2): 75 mg via ORAL
  Filled 2015-10-17 (×2): qty 1

## 2015-10-17 MED ORDER — SERTRALINE HCL 100 MG PO TABS
100.0000 mg | ORAL_TABLET | Freq: Every day | ORAL | Status: DC
  Administered 2015-10-18 – 2015-10-19 (×2): 100 mg via ORAL
  Filled 2015-10-17 (×2): qty 1

## 2015-10-17 MED ORDER — GABAPENTIN 100 MG PO CAPS
100.0000 mg | ORAL_CAPSULE | Freq: Every day | ORAL | Status: DC
  Administered 2015-10-17 – 2015-10-18 (×2): 100 mg via ORAL
  Filled 2015-10-17 (×2): qty 1

## 2015-10-17 MED ORDER — ASPIRIN 81 MG PO CHEW: 324.0000 mg | CHEWABLE_TABLET | Freq: Once | ORAL | Status: DC

## 2015-10-17 MED ORDER — ALLOPURINOL 100 MG PO TABS
100.0000 mg | ORAL_TABLET | Freq: Every day | ORAL | Status: DC
  Administered 2015-10-18 – 2015-10-19 (×2): 100 mg via ORAL
  Filled 2015-10-17 (×2): qty 1

## 2015-10-17 NOTE — Consult Note (Signed)
CARDIOLOGY CONSULT NOTE  Patient ID: [name] MRN: [id number] DOB/AGE: 1930-06-16 82 y.o.  Admit date: 10/17/2015 Referring Physician: ED Primary Physician: Dr. Renato [name] Primary Cardiologist: Dr. Meda [name] Reason for Consultation:  Chest Pain   HPI: [name] is an 82 yo female with PMHx of chronic combined CHF (EF 45-50% by LHC in September 2016), CAD (NSTEMI 06/2014 with [place] 9/16 showing moderate disease treated medically and recent cath 02/2015 with stable anatomy), controlled T2DM, HTN, HLD, OSA (on CPAP), PVD s/p L BKA 08/2012, ESRD on HD, COPD, and bradycardia (not on BB due to this) who presents to the ED on 10/17/15 with complaint of chest pain. Patient states she was half way through her HD session today when she developed left sided chest tightness/pressure radiating inferiorly and associated with diaphoresis, shortness of breath. She denied associated nausea or vomiting. Pain was a 7/10 and improved after 15-20 minutes with nitroglycerin and ASA 325 mg daily. Pain did not resolve completely and is currently at a 5-6/10. She states its "trying to come back." Pain seems to worsen with movement. Taking a deep breath actually improves her pain. Patient admits to lightheadedness which is not unusual for her. She states she often has to take her time getting up from a seated position due to lightheadedness. These symptoms have worsened since she started HD. Patient denies any increased orthopnea, weight gain, lower extremity swelling or increased shortness of breath or DOE. Patient reports compliance with all of her medications. She denies any recent chest pain or chest pain with exertion. This pain feels similar to her previous chest pain episodes.   Patient denies tobacco or alcohol use.   Past Medical History  Diagnosis Date  . Uterine cancer ([place])   . Diabetes mellitus     type II; peripheral neuropathy  . Hypertension   . GERD (gastroesophageal reflux disease)   .  Peripheral vascular disease ([place])     a. s/p L BKA 08/2012.  . Obesity   . Dyslipidemia   . Chronic combined systolic and diastolic CHF (congestive heart failure) ([facility])     a. EF 50-55%, mild LVH and grade 1 diast. Dysfxn b. Grade 3 Diastolic Dysfunction [id number]. b. 02/2015: EF 45-50% by cath.  . Osteoarthritis cervical spine   . DM retinopathy ([place])   . Sleep apnea     with CPAP  . History of shingles   . COPD (chronic obstructive pulmonary disease) ([place])   . Depression   . Peptic ulcer disease     duodenal  . Peptic stricture of esophagus   . Hiatal hernia   . Diverticulosis   . Arthritis   . CAD (coronary artery disease)     a. s/p multiple caths with nonobs CAD;   b. cath 1/10: pLAD 20%, mLAD 40%, pCFX 20%, mCFX 40%, pRCA 60-70%;   c.  Myoview 06/02/12: Low anterior wall scar, no ischemia, EF 37%. d. cath 06/20/2014 70% mid LCx, medical therapy, high risk for PCI due to close proximity to large OM e. cath 03/07/15 pro RCA 50% & pro to mid Cx 75%, both stable, LVEDP 33-33mm Hg-> medical management  . Cardiomyopathy ([place])     a. Echo 05/31/12: EF 40-45%. b. Echo 06/2014: EF 50-55%. c. Cath 02/2015: EF 45-50%.  . Asthma     Mild  . Pruritic condition     Idiopathic  . Zoster   . Noncompliance   . CKD (chronic kidney disease), stage III  Dr. Lorrene [name]  . Bradycardia     a. Not on BB due to this.  . Shortness of breath dyspnea      Past Surgical History  Procedure Laterality Date  . Tubal ligation  1967  . Knee arthroscopy  10/1998    Left  . Craniotomy  1997    Left for SDH  . Cataract extraction, bilateral  2005  . Hernia repair    . Esophagogastroduodenoscopy  04/04/2004  . Spine surgery      C-spine and lumbar surgery  . Cholecystectomy  2010  . Cardiac catheterization    . Dexa  7/05  . Amputation Left 09/04/2012    Procedure: AMPUTATION BELOW KNEE;  Surgeon: Christina Mould, MD;  Location: [facility];  Service: Vascular;  Laterality: Left;  . Abdominal angiogram N/A  08/31/2012    Procedure: ABDOMINAL ANGIOGRAM with runoff poss intervention;  Surgeon: Christina Mould, MD;  Location: [facility] CATH LAB;  Service: Cardiovascular;  Laterality: N/A;  . Tubal ligation    . Left heart catheterization with coronary angiogram N/A 06/22/2014    Procedure: LEFT HEART CATHETERIZATION WITH CORONARY ANGIOGRAM;  Surgeon: Christina Blanks, MD;  Location: [facility] CATH LAB;  Service: Cardiovascular;  Laterality: N/A;  . Eye surgery    . Brain surgery      1997 blood clot on the brain, then had to relieve fluid on the brain  . Multiple extractions with alveoloplasty N/A 08/01/2014    Procedure: EXTRACTIONS OF TEETH NUMBERS 7 8 9 10 11  AND 19 AND ALVEOLOPLASTY UPPER LEFT AND RIGHT QUADRANT;  Surgeon: [name], DDS;  Location: [place];  Service: Oral Surgery;  Laterality: N/A;  . Cardiac catheterization N/A 03/07/2015    Procedure: Left Heart Cath and Coronary Angiography;  Surgeon: Christina Man, MD;  Location: [place] CV LAB;  Service: Cardiovascular;  Laterality: N/A;  . Av fistula placement Left 05/10/2015    Procedure: CREATION OF LEFT UPPER ARM ARTERIOVENOUS (AV) FISTULA ;  Surgeon: Christina Dutch, MD;  Location: [facility] OR;  Service: Vascular;  Laterality: Left;     Family History  Problem Relation Age of Onset  . Cancer - Other Mother     "Stomach" Cancer  . Diabetes Mother   . Heart disease Mother   . Stomach cancer Mother   . Hypertension Mother   . Lymphoma Father   . Hypertension Father   . Kidney disease Paternal Grandmother   . Asthma Other   . Diabetes Sister   . Rheum arthritis Mother   . Rheum arthritis Father   . Heart attack Neg Hx   . Stroke Paternal Grandmother     Social History: Social History   Social History  . Marital Status: Married    Spouse Name: N/A  . Number of Children: 7  . Years of Education: N/A   Occupational History  . Retired    Social History Main Topics  . Smoking status: Never Smoker   . Smokeless  tobacco: Never Used  . Alcohol Use: No     Comment: rare  . Drug Use: No  . Sexual Activity: No   Other Topics Concern  . Not on file   Social History Narrative     Prior to Admission medications   Medication Sig Start Date End Date Taking? Authorizing Provider  acetaminophen (TYLENOL) 500 MG tablet Take 500 mg by mouth every 6 (six) hours as needed for mild pain.    Historical Provider, MD  albuterol (PROVENTIL HFA;VENTOLIN HFA) 108 (90 BASE) MCG/ACT inhaler Inhale 2 puffs into the lungs every 4 (four) hours as needed for wheezing or shortness of breath. 04/19/15   Christina Shin, MD  albuterol (PROVENTIL) (2.5 MG/3ML) 0.083% nebulizer solution Take 3 mLs (2.5 mg total) by nebulization every 4 (four) hours as needed for wheezing or shortness of breath. 12/08/13   Robbie Lis, MD  allopurinol (ZYLOPRIM) 100 MG tablet Take 1 tablet (100 mg total) by mouth daily. 07/21/15   Christina Shin, MD  amLODipine (NORVASC) 10 MG tablet Take 1 tablet (10 mg total) by mouth daily. 07/21/15   Christina Shin, MD  aspirin EC 81 MG tablet Take 1 tablet (81 mg total) by mouth daily. 03/16/15   Dayna N Dunn, PA-C  atorvastatin (LIPITOR) 40 MG tablet TAKE 1 TABLET BY MOUTH DAILY AT 6 PM 07/21/15   Christina Shin, MD  atorvastatin (LIPITOR) 40 MG tablet TAKE 1 TABLET BY MOUTH DAILY AT 6 PM 09/26/15   Christina Shin, MD  calcitRIOL (ROCALTROL) 0.25 MCG capsule Take 1 capsule (0.25 mcg total) by mouth daily. 07/21/15   Christina Shin, MD  clopidogrel (PLAVIX) 75 MG tablet TAKE 1 TABLET BY MOUTH DAILY 10/17/15   Christina Shin, MD  donepezil (ARICEPT) 5 MG tablet Take 1 tablet (5 mg total) by mouth at bedtime. 07/21/15   Christina Shin, MD  ezetimibe (ZETIA) 10 MG tablet TAKE 1 TABLET BY MOUTH EVERY MORNING 10/17/15   Christina Shin, MD  feeding supplement (GLUCERNA SHAKE) LIQD Take 237 mLs by mouth every morning.     Historical Provider, MD  Fluticasone-Salmeterol (ADVAIR DISKUS) 100-50 MCG/DOSE AEPB Inhale 1 puff into the lungs 2 (two) times  daily. 04/19/15   Christina Shin, MD  gabapentin (NEURONTIN) 100 MG capsule TAKE 1 CAPSULE BY MOUTH EVERY NIGHT AT BEDTIME 10/16/15   Christina Shin, MD  hydrALAZINE (APRESOLINE) 25 MG tablet Take 1 tablet (25 mg total) by mouth 3 (three) times daily. 07/21/15   Christina Shin, MD  insulin NPH-regular Human (NOVOLIN 70/30) (70-30) 100 UNIT/ML injection Inject 8 Units into the skin daily with breakfast. 09/18/15   Christina Shin, MD  isosorbide mononitrate (IMDUR) 60 MG 24 hr tablet Take 60 mg by mouth daily. 04/05/15   Historical Provider, MD  isosorbide mononitrate (IMDUR) 60 MG 24 hr tablet TAKE 1 TABLET BY MOUTH DAILY 09/26/15   Dorothy Spark, MD  latanoprost (XALATAN) 0.005 % ophthalmic solution Place 1 drop into both eyes at bedtime.    Historical Provider, MD  nitroGLYCERIN (NITROSTAT) 0.4 MG SL tablet Place 0.4 mg under the tongue every 5 (five) minutes as needed for chest pain.     Historical Provider, MD  ONE TOUCH ULTRA TEST test strip USE TO TEST BLOOD SUGAR TWICE DAILY 07/14/15   Christina Shin, MD  pantoprazole (PROTONIX) 40 MG tablet Take 1 tablet (40 mg total) by mouth 2 (two) times daily. 07/21/15   Christina Shin, MD  pantoprazole (PROTONIX) 40 MG tablet TAKE 1 TABLET BY MOUTH TWICE DAILY 10/04/15   Christina Shin, MD  RANEXA 500 MG 12 hr tablet TAKE 1 TABLET BY MOUTH EVERY 12 HOURS 07/21/15   Gildardo Cranker, DO  sertraline (ZOLOFT) 100 MG tablet Take 1 tablet (100 mg total) by mouth daily. 07/21/15   Christina Shin, MD    HOSPITAL MEDICATIONS: . aspirin  324 mg Oral Once  . insulin aspart  0-5 Units Subcutaneous QHS  . insulin aspart  0-9 Units Subcutaneous TID WC  ROS: General: Admits to subjective fever, chills, diaphoresis. Denies fatigue, change in appetite.  Respiratory: Admits to chest tightness. Denies SOB, cough, DOE, and wheezing.   Cardiovascular: Admits to chest pain. Denies palpitations.  Gastrointestinal: Denies nausea, vomiting, abdominal pain, diarrhea, constipation.  Endocrine:  Denies polyuria, and polydipsia. Musculoskeletal: Admits to chest wall tenderness. Denies back pain.  Skin: Denies rash and wounds.  Neurological: Admits to lightheadedness. Denies headaches, weakness, numbness  Physical Exam: Filed Vitals:   10/17/15 1645 10/17/15 1700 10/17/15 1715 10/17/15 1730  BP: 140/54 136/54 126/50 135/50  Pulse: 73 75 68 68  Temp:      TempSrc:      Resp: 18 16 21 20   SpO2: 100% 100% 100% 97%   General: Vital signs reviewed.  Patient is elderly, in no acute distress and cooperative with exam.  Head: Normocephalic and atraumatic. Eyes: EOMI, conjunctivae normal.  Neck: Supple, trachea midline, no JVD or carotid bruit present.  Cardiovascular: RRR, S1 normal, S2 normal, no murmurs, gallops, or rubs. Tenderness on palpation of anterior chest wall. Temporary HD cath in place right chest-C/D/I, no erythema, edema or increased warmth. Pulmonary/Chest: Clear to auscultation bilaterally, no wheezes, rales, or rhonchi. Abdominal: Soft, non-tender, non-distended, BS +.  Extremities: No lower extremity edema in RLE, LLE is s/p BKA. LUE AVF with good thrill and bruit. Neurological: A&O x3 Skin: Warm, dry and intact. No rashes or erythema. Psychiatric: Normal mood and affect. speech and behavior is normal. Cognition and memory are grossly normal.   Labs:   Lab Results  Component Value Date   WBC 9.7 10/17/2015   HGB 12.3 10/17/2015   HCT 37.0 10/17/2015   MCV 91.4 10/17/2015   PLT 277 10/17/2015     Recent Labs Lab 10/17/15 1458  NA 138  K 3.4*  CL 99*  CO2 28  BUN 31*  CREATININE 3.30*  CALCIUM 9.2  GLUCOSE 174*   Lab Results  Component Value Date   CKTOTAL 425* 05/30/2012   CKMB 6.5* 05/30/2012   TROPONINI 0.15* 04/25/2015    Lab Results  Component Value Date   CHOL 118 08/18/2014   CHOL 139 01/08/2013   CHOL 187 11/21/2011   Lab Results  Component Value Date   HDL 68.30 08/18/2014   HDL 53.00 01/08/2013   HDL 61.40 11/21/2011   Lab  Results  Component Value Date   LDLCALC 31 08/18/2014   LDLCALC 51 01/08/2013   LDLCALC 111* 11/21/2011   Lab Results  Component Value Date   TRIG 96.0 08/18/2014   TRIG 173.0* 01/08/2013   TRIG 73.0 11/21/2011   Lab Results  Component Value Date   CHOLHDL 2 08/18/2014   CHOLHDL 3 01/08/2013   CHOLHDL 3 11/21/2011   Lab Results  Component Value Date   LDLDIRECT 123.0 07/06/2010      Radiology: Dg Chest 2 View  10/17/2015  CLINICAL DATA:  Chest pain for 1 day EXAM: CHEST  2 VIEW COMPARISON:  05/03/2015 FINDINGS: There is no focal parenchymal opacity. There is no pleural effusion or pneumothorax. There is stable cardiomegaly. There is a dual-lumen right-sided central venous catheter. The osseous structures are unremarkable. IMPRESSION: No active cardiopulmonary disease. Electronically Signed   By: Kathreen Devoid   On: 10/17/2015 14:44    EKG: Sinus rhythm, HR 77, QTc prolongation, no acute ST or T wave changes. Similar on comparison to EKG from November 2016.   ASSESSMENT AND PLAN:   NSTEMI: Patient presents with left sided chest pain which occurred  during HD and was associated with diaphoresis, dyspnea, palpitations which improved after 15-20 minutes with ASA 325 mg once and nitroglycerin. Troponin elevated at 0.41. EKG without acute ST or TW changes. Patient has history of NSTEMI attributed to CHF exacerbation in the past. Patient also has underlying CAD with h/o NSTEMI treated medically. Will place patient on Heparin gtt while trending troponins in the setting of continued chest pain. If troponins flatten out or trend down, we will likely just treat medically. -Trend troponins -Repeat EKG tomorrow am -Continue ASA 81 mg daily -Consider Heparin gtt with ongoing chest pain -Morphine 2 mg Q2H prn  -Zofran 4 mg Q6H prn  -Nitro prn -Would not pursue further cardiac evaluation given recent stable cath in September, unless troponins significantly increase  Chronic Combined CHF: EF  45-50% and severely elevated LVEDP of 33-38 mmHg suggestive of significant diastolic heart failure by LHC in September 2016. Baseline dry weight appears to be 180 lbs per Cardiology clinic notes. Weight today is 170 lbs. Patient is on Imdur 60 mg QD, Hydralazine 25 mg TID, and Amlodipine 10 mg daily at home. Patient appears to be euvolemic on exam.  -Continue Amlodipine 10 mg daily -Continue Hydralazine 25 mg TID -Continue Imdur 60 mg daily  CAD: History of NSTEMI in January 2016, treated medically. LHC in September 2016 showed proximal RCA lesion, 50% stenosed and proximal Cx to Mid Cx lesion, 75% stenosed. Both are stable from previous angiography. Patient is on Ranexa 500 mg BID, nitroglycerin prn, Plavix 75 mg daily, Atorvastatin 40 mg daily, ASA 81 mg daily at home.  -Continue nitroglycerin prn -Atorvastatin 40 mg daily -ASA 81 mg daily -Plavix 75 mg daily -Continue Imdur 60 mg daily -Continue Ranexa 500 mg BID  HTN: Patient is on Imdur 60 mg QD, Hydralazine 25 mg TID, and Amlodipine 10 mg daily at home. BP in ED 123/47 and patient reports lightheadedness. She experience a pre-syncopal event in the ED. Patient states this is more common since she started HD. May need to back off on BP meds if orthostatic. -Orthostatic vital signs -Continue amlodipine 10 mg daily -Continue hydralazine 25 mg TID -Continue Imdur 60 mg daily  HLD: On Atorvastatin and Zetia at home. -Continue home medications  QTc Prolongation: EKG shows QTc of 513. Previous EKG in 11/16 showed QTc of 449. Patient has been monitored by Cardiology for QTc prolongation while on Ranexa without previous issues. This can also be seen with Zoloft and Aricept.  -Monitor on Telemetry -Ok to continue Ranexa  ESRD on HD TTS: Completed 1/2 session today.  -Per Nephrololgy  H/o Sinus Bradycardia: Patient is not on a BB due to h/o bradycardia. HR is currently in the 70s. -Monitor on Telemetry  Hypokalemia: K 3.4 on  admission. -Replace per primary   OSA on CPAP: Compliant with CPAP. -Would continue CPAP QHS  Controlled T2DM: Follows with Dr. Loanne Drilling. Last A1c 6.2% on 10/17/15. -Per primary  Martyn Malay, DO PGY-2 Internal Medicine Resident Pager # [phone] 10/17/2015 6:08 PM  I have seen and examined the patient along with [place], DO.  I have reviewed the chart, notes and new data.  I agree with her note.  Key new complaints: Chest discomfort substantially improved, although not completely resolved. She has a nitroglycerin patch on but the improvement was gradual rather than abrupt. Denies any breathing difficulties Key examination changes: Normal cardiovascular exam, mild increase in heart rate may be related to nitrates; no overt heart failure Key new findings / data:  Weight is substantially lower than previously estimated dry weight; slightly prolonged QT interval in the setting of mild hypokalemia and treatment with ranolazine; fairly marginal elevation in cardiac troponin I  PLAN: At this point the clinical picture does not generally suggest a non-STEMI, but need to obtain repeat enzymes and maybe another echocardiogram. It would not be surprising for this level of elevation in cardiac enzymes to be related to end-stage renal disease and chronic heart failure. However, if there is a typical "rise and fall" pattern to the cardiac enzymes or if her echocardiogram shows distinct regional wall motion abnormalities or reduction in left ventricular ejection fraction, it may be wise to reconsider options for revascularization. At this point her symptoms and the data do not suggest need for urgent invasive evaluation.  Sanda Klein, MD, [place] [phone] 10/17/2015, 7:43 PM

## 2015-10-17 NOTE — ED Notes (Signed)
Attempted report x1. 

## 2015-10-17 NOTE — H&P (Signed)
History and Physical    [name] [id number] DOB: 1929/12/19 DOA: 10/17/2015  Referring MD/NP/PA: Wyvonnia Dusky / ER PCP: Renato Shin, MD  Outpatient Specialists:  Loanne Drilling / Endocrinology; Meda Coffee / Cardiology; Fields / VVS; Building control surveyor / Pulmonary Patient coming from: Private residence  Chief Complaint: Chest pain during dialysis  HPI: [name] is a 82 y.o. female with medical history significant for coronary artery disease with stable disease not amenable to intervention therefore focus has been on medical management, chronic kidney disease now on dialysis, sleep apnea and COPD, hypertension, chronic diastolic heart failure managed now with dialysis, insulin-dependent diabetes with recent documentation of overcorrection with adjustments in insulin by outpatient endocrinology, peripheral vascular disease status post amputation, dyslipidemia who presented to the ER today after developing chest pain during hemodialysis. Patient reported that when the chest pain began it was 6/10 and would wax and wane. She is currently having chest pain 7/10. No other associated symptoms. She was given nitroglycerin sublingual by EMS without any change in pain she was also given 5 baby aspirin. Prior to today she had not been having any issues with chest pain. She noted that her blood pressure actually increased with hemodialysis and was later prior to her reports of chest pain. She stated she has been having some palpitations especially during dialysis today. She has not had any upper respiratory infection symptoms, she has not had any swelling of her legs. She reported subjective fevers 2 nights ago stating "I felt like I was burning up and it woke me up from sleep".  ED Course:  Temp 98.9, BP 127/58, pulse 75, respirations 16-room air saturations 100% Orthostatic vital signs normal Two-view chest x-ray: No acute process Lab data: Sodium 138, potassium 3.4, chloride 99, BUN 31, creatinine 3.3 (after  partial dialysis), glucose 174, poc troponin 0.41, WBC 9700 and differential obtained, hemoglobin 12.3, platelets 277,000 Aspirin 324 mg PO 1 Nitroglycerin ointment 0.5 inch 1 Sliding scale Insulin 2 units  Review of Systems:  In addition to the HPI above,  No chills, myalgias or other constitutional symptoms No Headache, changes with Vision or hearing, new weakness, tingling, numbness in any extremity, No problems swallowing food or Liquids, indigestion/reflux No Cough or Shortness of Breath, palpitations, orthopnea or DOE No Abdominal pain, N/V; no melena or hematochezia, no dark tarry stools No dysuria, hematuria or flank pain No new skin rashes, lesions, masses or bruises, No new joints pains-aches No recent weight gain or loss No polyuria, polydypsia or polyphagia,   Past Medical History  Diagnosis Date  . Uterine cancer ([place])   . Diabetes mellitus     type II; peripheral neuropathy  . Hypertension   . GERD (gastroesophageal reflux disease)   . Peripheral vascular disease ([place])     a. s/p L BKA 08/2012.  . Obesity   . Dyslipidemia   . Chronic combined systolic and diastolic CHF (congestive heart failure) (HCC)     a. EF 50-55%, mild LVH and grade 1 diast. Dysfxn b. Grade 3 Diastolic Dysfunction [id number]. b. 02/2015: EF 45-50% by cath.  . Osteoarthritis cervical spine   . DM retinopathy ([place])   . Sleep apnea     with CPAP  . History of shingles   . COPD (chronic obstructive pulmonary disease) ([place])   . Depression   . Peptic ulcer disease     duodenal  . Peptic stricture of esophagus   . Hiatal hernia   . Diverticulosis   . Arthritis   . CAD (  coronary artery disease)     a. s/p multiple caths with nonobs CAD;   b. cath 1/10: pLAD 20%, mLAD 40%, pCFX 20%, mCFX 40%, pRCA 60-70%;   c.  Myoview 06/02/12: Low anterior wall scar, no ischemia, EF 37%. d. cath 06/20/2014 70% mid LCx, medical therapy, high risk for PCI due to close proximity to large OM e. cath 03/07/15 pro RCA  50% & pro to mid Cx 75%, both stable, LVEDP 33-25mm Hg-> medical management  . Cardiomyopathy ([place])     a. Echo 05/31/12: EF 40-45%. b. Echo 06/2014: EF 50-55%. c. Cath 02/2015: EF 45-50%.  . Asthma     Mild  . Pruritic condition     Idiopathic  . Zoster   . Noncompliance   . CKD (chronic kidney disease), stage III     Dr. Lorrene Reid  . Bradycardia     a. Not on BB due to this.  . Shortness of breath dyspnea     Past Surgical History  Procedure Laterality Date  . Tubal ligation  1967  . Knee arthroscopy  10/1998    Left  . Craniotomy  1997    Left for SDH  . Cataract extraction, bilateral  2005  . Hernia repair    . Esophagogastroduodenoscopy  04/04/2004  . Spine surgery      C-spine and lumbar surgery  . Cholecystectomy  2010  . Cardiac catheterization    . Dexa  7/05  . Amputation Left 09/04/2012    Procedure: AMPUTATION BELOW KNEE;  Surgeon: Angelia Mould, MD;  Location: [facility];  Service: Vascular;  Laterality: Left;  . Abdominal angiogram N/A 08/31/2012    Procedure: ABDOMINAL ANGIOGRAM with runoff poss intervention;  Surgeon: Angelia Mould, MD;  Location: [facility] CATH LAB;  Service: Cardiovascular;  Laterality: N/A;  . Tubal ligation    . Left heart catheterization with coronary angiogram N/A 06/22/2014    Procedure: LEFT HEART CATHETERIZATION WITH CORONARY ANGIOGRAM;  Surgeon: Burnell Blanks, MD;  Location: [facility] CATH LAB;  Service: Cardiovascular;  Laterality: N/A;  . Eye surgery    . Brain surgery      1997 blood clot on the brain, then had to relieve fluid on the brain  . Multiple extractions with alveoloplasty N/A 08/01/2014    Procedure: EXTRACTIONS OF TEETH NUMBERS 7 8 9 10 11  AND 19 AND ALVEOLOPLASTY UPPER LEFT AND RIGHT QUADRANT;  Surgeon: Isac Caddy, DDS;  Location: [place];  Service: Oral Surgery;  Laterality: N/A;  . Cardiac catheterization N/A 03/07/2015    Procedure: Left Heart Cath and Coronary Angiography;  Surgeon: Leonie Man, MD;   Location: [place] CV LAB;  Service: Cardiovascular;  Laterality: N/A;  . Av fistula placement Left 05/10/2015    Procedure: CREATION OF LEFT UPPER ARM ARTERIOVENOUS (AV) FISTULA ;  Surgeon: Elam Dutch, MD;  Location: [place];  Service: Vascular;  Laterality: Left;     reports that she has never smoked. She has never used smokeless tobacco. She reports that she does not drink alcohol or use illicit drugs.  Allergies  Allergen Reactions  . Iohexol Itching and Rash     Code: RASH, Desc: [place] ON PT'S CHART ALLERGIC TO IV DYE 09/04/07/RM, Onset Date: [id number]     Family History  Problem Relation Age of Onset  . Cancer - Other Mother     "Stomach" Cancer  . Diabetes Mother   . Heart disease Mother   . Stomach cancer Mother   .  Hypertension Mother   . Lymphoma Father   . Hypertension Father   . Kidney disease Paternal Grandmother   . Asthma Other   . Diabetes Sister   . Rheum arthritis Mother   . Rheum arthritis Father   . Heart attack Neg Hx   . Stroke Paternal Grandmother      Prior to Admission medications   Medication Sig Start Date End Date Taking? Authorizing Provider  acetaminophen (TYLENOL) 500 MG tablet Take 500 mg by mouth every 6 (six) hours as needed for mild pain.   Yes Historical Provider, MD  albuterol (PROVENTIL HFA;VENTOLIN HFA) 108 (90 BASE) MCG/ACT inhaler Inhale 2 puffs into the lungs every 4 (four) hours as needed for wheezing or shortness of breath. 04/19/15  Yes Renato Shin, MD  allopurinol (ZYLOPRIM) 100 MG tablet Take 1 tablet (100 mg total) by mouth daily. 07/21/15  Yes Renato Shin, MD  amLODipine (NORVASC) 10 MG tablet Take 1 tablet (10 mg total) by mouth daily. 07/21/15  Yes Renato Shin, MD  aspirin EC 81 MG tablet Take 1 tablet (81 mg total) by mouth daily. 03/16/15  Yes Dayna N Dunn, PA-C  atorvastatin (LIPITOR) 40 MG tablet TAKE 1 TABLET BY MOUTH DAILY AT 6 PM 07/21/15  Yes Renato Shin, MD  calcitRIOL (ROCALTROL) 0.25 MCG capsule Take 1  capsule (0.25 mcg total) by mouth daily. 07/21/15  Yes Renato Shin, MD  clopidogrel (PLAVIX) 75 MG tablet TAKE 1 TABLET BY MOUTH DAILY 10/17/15  Yes Renato Shin, MD  donepezil (ARICEPT) 5 MG tablet Take 1 tablet (5 mg total) by mouth at bedtime. 07/21/15  Yes Renato Shin, MD  ezetimibe (ZETIA) 10 MG tablet TAKE 1 TABLET BY MOUTH EVERY MORNING 10/17/15  Yes Renato Shin, MD  feeding supplement (GLUCERNA SHAKE) LIQD Take 237 mLs by mouth every morning.    Yes Historical Provider, MD  Fluticasone-Salmeterol (ADVAIR DISKUS) 100-50 MCG/DOSE AEPB Inhale 1 puff into the lungs 2 (two) times daily. 04/19/15  Yes Renato Shin, MD  gabapentin (NEURONTIN) 100 MG capsule TAKE 1 CAPSULE BY MOUTH EVERY NIGHT AT BEDTIME 10/16/15  Yes Renato Shin, MD  hydrALAZINE (APRESOLINE) 25 MG tablet Take 1 tablet (25 mg total) by mouth 3 (three) times daily. 07/21/15  Yes Renato Shin, MD  insulin NPH-regular Human (NOVOLIN 70/30) (70-30) 100 UNIT/ML injection Inject 8 Units into the skin daily with breakfast. 09/18/15  Yes Renato Shin, MD  isosorbide mononitrate (IMDUR) 60 MG 24 hr tablet TAKE 1 TABLET BY MOUTH DAILY 09/26/15  Yes Dorothy Spark, MD  latanoprost (XALATAN) 0.005 % ophthalmic solution Place 1 drop into both eyes at bedtime.   Yes Historical Provider, MD  nitroGLYCERIN (NITROSTAT) 0.4 MG SL tablet Place 0.4 mg under the tongue every 5 (five) minutes as needed for chest pain.    Yes Historical Provider, MD  pantoprazole (PROTONIX) 40 MG tablet Take 1 tablet (40 mg total) by mouth 2 (two) times daily. 07/21/15  Yes Renato Shin, MD  RANEXA 500 MG 12 hr tablet TAKE 1 TABLET BY MOUTH EVERY 12 HOURS 07/21/15  Yes Gildardo Cranker, DO  sertraline (ZOLOFT) 100 MG tablet Take 1 tablet (100 mg total) by mouth daily. 07/21/15  Yes Renato Shin, MD  ONE TOUCH ULTRA TEST test strip USE TO TEST BLOOD SUGAR TWICE DAILY 07/14/15   Renato Shin, MD    Physical Exam: Filed Vitals:   10/17/15 1645 10/17/15 1700 10/17/15 1715 10/17/15 1730  BP:  140/54 136/54 126/50 135/50  Pulse: 73 75 68  68  Temp:      TempSrc:      Resp: 18 16 21 20   SpO2: 100% 100% 100% 97%      Constitutional: NAD, calm, comfortable Eyes: PERRL, lids and conjunctivae normal ENMT: Mucous membranes are moist. Posterior pharynx clear of any exudate or lesions.Normal dentition.  Neck: normal, supple, no masses, no thyromegaly Respiratory: clear to auscultation bilaterally, no wheezing, no crackles. Normal respiratory effort. No accessory muscle use. Note chest pain reproducible with palpation of her anterior chest wall Cardiovascular: Regular rate and rhythm, no murmurs / rubs / gallops. No extremity edema. 2+ pedal pulses. No carotid bruits.  Abdomen: no tenderness, no masses palpated. No hepatosplenomegaly. Bowel sounds positive.  Musculoskeletal: no clubbing / cyanosis. No joint deformity upper and lower extremities. Good ROM, no contractures. Normal muscle tone. Dialysis access/AV fistula in left upper extremity with good palpable thrill Skin: no rashes, lesions, ulcers. No induration Neurologic: CN 2-12 grossly intact. Sensation intact, DTR normal. Strength 5/5 x all 4 extremities.  Psychiatric: Normal judgment and insight. Alert and oriented x 3. Normal mood.  Other: has dialysis vas cath in place right anterior chest tightness is unremarkable in appearance and covered by dry dressing   Labs on Admission: I have personally reviewed following labs and imaging studies  CBC:  Recent Labs Lab 10/17/15 1458  WBC 9.7  HGB 12.3  HCT 37.0  MCV 91.4  PLT 99991111   Basic Metabolic Panel:  Recent Labs Lab 10/17/15 1458  NA 138  K 3.4*  CL 99*  CO2 28  GLUCOSE 174*  BUN 31*  CREATININE 3.30*  CALCIUM 9.2   GFR: CrCl cannot be calculated (Unknown ideal weight.). Liver Function Tests: No results for input(s): AST, ALT, ALKPHOS, BILITOT, PROT, ALBUMIN in the last 168 hours. No results for input(s): LIPASE, AMYLASE in the last 168 hours. No results  for input(s): AMMONIA in the last 168 hours. Coagulation Profile: No results for input(s): INR, PROTIME in the last 168 hours. Cardiac Enzymes: No results for input(s): CKTOTAL, CKMB, CKMBINDEX, TROPONINI in the last 168 hours. BNP (last 3 results) No results for input(s): PROBNP in the last 8760 hours. HbA1C: No results for input(s): HGBA1C in the last 72 hours. CBG:  Recent Labs Lab 10/17/15 1743  GLUCAP 151*   Lipid Profile: No results for input(s): CHOL, HDL, LDLCALC, TRIG, CHOLHDL, LDLDIRECT in the last 72 hours. Thyroid Function Tests: No results for input(s): TSH, T4TOTAL, FREET4, T3FREE, THYROIDAB in the last 72 hours. Anemia Panel: No results for input(s): VITAMINB12, FOLATE, FERRITIN, TIBC, IRON, RETICCTPCT in the last 72 hours. Urine analysis:    Component Value Date/Time   COLORURINE RED* 04/30/2015 1538   APPEARANCEUR TURBID* 04/30/2015 1538   LABSPEC 1.025 04/30/2015 1538   PHURINE 5.0 04/30/2015 1538   GLUCOSEU 100* 04/30/2015 1538   GLUCOSEU NEGATIVE 08/18/2014 1119   HGBUR LARGE* 04/30/2015 1538   HGBUR negative 09/03/2007 1045   BILIRUBINUR SMALL* 04/30/2015 1538   BILIRUBINUR negative 01/03/2011 1038   KETONESUR 15* 04/30/2015 1538   PROTEINUR 100* 04/30/2015 1538   PROTEINUR ++ (100mg /dl) 01/03/2011 1038   UROBILINOGEN 1.0 04/30/2015 1538   UROBILINOGEN 0.2mg /dl 01/03/2011 1038   NITRITE NEGATIVE 04/30/2015 1538   NITRITE negative 01/03/2011 1038   LEUKOCYTESUR MODERATE* 04/30/2015 1538   Sepsis Labs: @LABRCNTIP (procalcitonin:4,lacticidven:4) )No results found for this or any previous visit (from the past 240 hour(s)).   Radiological Exams on Admission: Dg Chest 2 View  10/17/2015  CLINICAL DATA:  Chest pain  for 1 day EXAM: CHEST  2 VIEW COMPARISON:  05/03/2015 FINDINGS: There is no focal parenchymal opacity. There is no pleural effusion or pneumothorax. There is stable cardiomegaly. There is a dual-lumen right-sided central venous catheter. The  osseous structures are unremarkable. IMPRESSION: No active cardiopulmonary disease. Electronically Signed   By: Kathreen Devoid   On: 10/17/2015 14:44    EKG: (Independently reviewed) Sinus rhythm with ventricular rate 77 bpm, QTC slightly elevated at 513 ms, nonspecific ST segment changes in lateral leads unchanged from previous EKG  Assessment/Plan Principal Problem:   Chest pain syndrome/Coronary artery disease/Elevated troponin -Current chest pain has both typical and atypical features: Was associated with hypertension but is also reproducible with chest wall palpation yet patient reports is not typical of previous MI type chest pain -Heart score equals 5 -Typically has markedly elevated troponin but current troponin is higher than baseline so we'll continue to cycle -Had stable cardiac catheterization September 2016 without any PCI targets so likely will continue to focus on medical management -Cardiology has seen in consultation -Patient's blood pressure somewhat suboptimal with systolic readings in the [id number] on nitroglycerin paste-have opted to hold Imdur and Ranexa for now (see below regarding Ranexa) -Continue Plavix, Zetia, Lipitor, and baby aspirin  Active Problems:   CKD (chronic kidney disease) stage V requiring chronic dialysis  -Had incomplete dialysis today because of chest pain and potassium is 3.4, pulmonary exam unremarkable and no evidence of heart failure on chest x-ray -Currently patient does not meet criteria for inpatient status nor does she meet criteria for inpatient dialysis -If remains hospitalized by Thursday and therefore transitions to inpatient status will need to consult nephrology to pursue inpatient hemodialysis -Patient currently does not have fever or leukocytosis but given reports of symptoms that may be consistent with nocturnal fever spike and the fact she has a dialysis catheter in place have opted to check blood cultures as a precaution to rule out  infection -Patient does have AV fistula in left upper arm but has been unable to utilize this secondary to thrombosis issues    Prolonged Q-T interval on ECG -Last EKG in November without evidence of prolonged QTC and according to most recent outpatient cardiology visit in February of this year patient's QTc apparently was normal then -Repeat EKG in a.m. -Holding Ranexa -Patient did report a sensation of palpitations and could've had arrhythmia    Obstructive sleep apnea -Nocturnal CPAP ordered -Apically utilizes for 4 hours according to outpatient pulmonology records    Essential hypertension -Current blood pressures around [id number] systolic -Have tentatively reordered preadmission Norvasc and Apresoline; nitrates on hold    Chronic diastolic congestive heart failure, NYHA class 3  -Compensated without evidence of volume overload -Primary modality of treatment is dialysis    Controlled insulin dependent diabetes mellitus  -Last outpatient endocrinology evaluation was on 4/2 -Most recent hemoglobin A1c 6.2 and therefore NPH insulin reduced to 8 units every morning -Patient and family deny issues with recent hypoglycemia    Hyperlipidemia -Continue Lipitor and Zetia    PERIPHERAL VASCULAR DISEASE/Gait instability: Status post left BKA -Consider PT evaluation during this admission -Continue Neurontin    COPD (chronic obstructive pulmonary disease)  -Currently compensated without wheezing or hypoxemia       DVT prophylaxis: Subcutaneous heparin  Code Status: Full code  Family Communication: Husband [name] at bedside as well as daughter  Disposition Plan: Anticipate discharge back to previous home environment with spouse Consults called: Cardiology/ initially evaluated by cardiology resident  Admission status: Telemetry/observation Mobility: Rolling walker and wheelchair   ELLIS,ALLISON L. ANP-BC [facility] Pager [phone]   If 7PM-7AM, please contact  night-coverage [email or web address] Password [facility]  10/17/2015, 5:58 PM

## 2015-10-17 NOTE — ED Notes (Signed)
Pt. BIB [facility] for evaluation of L sided CP starting this AM. Pt. Received half of dialysis txt this AM prior to saying she had CP. Pt. Given 324 ASA and 1 Nitro with improvement. Pt. With hx of NSTEMI

## 2015-10-17 NOTE — Progress Notes (Signed)
ANTICOAGULATION CONSULT NOTE - Initial Consult  Pharmacy Consult for Heparin  Indication: chest pain/ACS  Allergies  Allergen Reactions  . Iohexol Itching and Rash     Code: RASH, Desc: [place] ON PT'S CHART ALLERGIC TO IV DYE 09/04/07/RM, Onset Date: [id number]     Patient Measurements: Height: 5\' 6"  (167.6 cm) Weight: 164 lb 4.8 oz (74.526 kg) IBW/kg (Calculated) : 59.3 Heparin Dosing Weight: 74.2 kg  Vital Signs: Temp: 98.5 F (36.9 C) (05/02 1813) Temp Source: Oral (05/02 1813) BP: 128/47 mmHg (05/02 1813) Pulse Rate: 79 (05/02 1813)  Labs:  Recent Labs  10/17/15 1458  HGB 12.3  HCT 37.0  PLT 277  CREATININE 3.30*    Estimated Creatinine Clearance: 13.1 mL/min (by C-G formula based on Cr of 3.3).   Medical History: Past Medical History  Diagnosis Date  . Uterine cancer ([place])   . Diabetes mellitus     type II; peripheral neuropathy  . Hypertension   . GERD (gastroesophageal reflux disease)   . Peripheral vascular disease ([place])     a. s/p L BKA 08/2012.  . Obesity   . Dyslipidemia   . Chronic combined systolic and diastolic CHF (congestive heart failure) (HCC)     a. EF 50-55%, mild LVH and grade 1 diast. Dysfxn b. Grade 3 Diastolic Dysfunction [id number]. b. 02/2015: EF 45-50% by cath.  . Osteoarthritis cervical spine   . DM retinopathy ([place])   . Sleep apnea     with CPAP  . History of shingles   . COPD (chronic obstructive pulmonary disease) ([place])   . Depression   . Peptic ulcer disease     duodenal  . Peptic stricture of esophagus   . Hiatal hernia   . Diverticulosis   . Arthritis   . CAD (coronary artery disease)     a. s/p multiple caths with nonobs CAD;   b. cath 1/10: pLAD 20%, mLAD 40%, pCFX 20%, mCFX 40%, pRCA 60-70%;   c.  Myoview 06/02/12: Low anterior wall scar, no ischemia, EF 37%. d. cath 06/20/2014 70% mid LCx, medical therapy, high risk for PCI due to close proximity to large OM e. cath 03/07/15 pro RCA 50% & pro to mid Cx 75%, both  stable, LVEDP 33-63mm Hg-> medical management  . Cardiomyopathy ([place])     a. Echo 05/31/12: EF 40-45%. b. Echo 06/2014: EF 50-55%. c. Cath 02/2015: EF 45-50%.  . Asthma     Mild  . Pruritic condition     Idiopathic  . Zoster   . Noncompliance   . CKD (chronic kidney disease), stage III     Dr. Lorrene Reid  . Bradycardia     a. Not on BB due to this.  . Shortness of breath dyspnea    Assessment: 82 year old female admitted with chest pain/ACS to start IV heparin. Note she has multiple co-morbidities including ESRD on dialysis Tuesday-Thursday-Saturday. Last HD was today but was an incomplete session. CBC is within normal limits. Troponin is positive at 0.41. Patient was not on anticoagulation other than ASA + Plavix prior to admission.   Goal of Therapy:  Heparin level 0.3-0.7 units/ml Monitor platelets by anticoagulation protocol: Yes   Plan:  Heparin bolus of 4000 units x1 Heparin drip at 850 units/hr Heparin level in 8 hours.  Daily heparin level and CBC while on therapy.   Sloan Leiter, PharmD, BCPS Clinical Pharmacist [phone]  10/17/2015,6:26 PM

## 2015-10-17 NOTE — ED Provider Notes (Signed)
CSN: [id number]     Arrival date & time 10/17/15  1415 History   First MD Initiated Contact with Patient 10/17/15 1431     Chief Complaint  Patient presents with  . Chest Pain   PCP: Renato Shin, MD  (Consider location/radiation/quality/duration/timing/severity/associated sxs/prior Treatment) HPI Patient presents to the [facility] ED via EMS for chest pain. Symptoms started halfway into dialysis. She reports associated diaphoresis, dyspnea and palpitations. Chest pain located left anterior with no radiation. Symptoms improved after about 10 minutes and responded to nitroglycerin. She also received 5 baby aspirins in EMS per her report, (324mg  per documentation) No nausea, vomiting associated. She currently follows up with [facility]. She has a recent cath, which was stable. Last echo in January of 2017 which showed an EF of 50-55% and grade 3 diastolic dysfunction. Last cath in 02/2015 significant for RCA 50% stenosed/stable, Cx 75% stenosed/stable. Last seen Dr. Meda Coffee in February  Past Medical History  Diagnosis Date  . Uterine cancer ([place])   . Diabetes mellitus     type II; peripheral neuropathy  . Hypertension   . GERD (gastroesophageal reflux disease)   . Peripheral vascular disease ([place])     a. s/p L BKA 08/2012.  . Obesity   . Dyslipidemia   . Chronic combined systolic and diastolic CHF (congestive heart failure) (HCC)     a. EF 50-55%, mild LVH and grade 1 diast. Dysfxn b. Grade 3 Diastolic Dysfunction [id number]. b. 02/2015: EF 45-50% by cath.  . Osteoarthritis cervical spine   . DM retinopathy ([place])   . Sleep apnea     with CPAP  . History of shingles   . COPD (chronic obstructive pulmonary disease) ([place])   . Depression   . Peptic ulcer disease     duodenal  . Peptic stricture of esophagus   . Hiatal hernia   . Diverticulosis   . Arthritis   . CAD (coronary artery disease)     a. s/p multiple caths with nonobs CAD;   b. cath 1/10: pLAD 20%, mLAD 40%, pCFX 20%, mCFX  40%, pRCA 60-70%;   c.  Myoview 06/02/12: Low anterior wall scar, no ischemia, EF 37%. d. cath 06/20/2014 70% mid LCx, medical therapy, high risk for PCI due to close proximity to large OM e. cath 03/07/15 pro RCA 50% & pro to mid Cx 75%, both stable, LVEDP 33-41mm Hg-> medical management  . Cardiomyopathy ([place])     a. Echo 05/31/12: EF 40-45%. b. Echo 06/2014: EF 50-55%. c. Cath 02/2015: EF 45-50%.  . Asthma     Mild  . Pruritic condition     Idiopathic  . Zoster   . Noncompliance   . CKD (chronic kidney disease), stage III     Dr. Lorrene Reid  . Bradycardia     a. Not on BB due to this.  . Shortness of breath dyspnea    Past Surgical History  Procedure Laterality Date  . Tubal ligation  1967  . Knee arthroscopy  10/1998    Left  . Craniotomy  1997    Left for SDH  . Cataract extraction, bilateral  2005  . Hernia repair    . Esophagogastroduodenoscopy  04/04/2004  . Spine surgery      C-spine and lumbar surgery  . Cholecystectomy  2010  . Cardiac catheterization    . Dexa  7/05  . Amputation Left 09/04/2012    Procedure: AMPUTATION BELOW KNEE;  Surgeon: Angelia Mould, MD;  Location: [facility]  OR;  Service: Vascular;  Laterality: Left;  . Abdominal angiogram N/A 08/31/2012    Procedure: ABDOMINAL ANGIOGRAM with runoff poss intervention;  Surgeon: Angelia Mould, MD;  Location: [facility] CATH LAB;  Service: Cardiovascular;  Laterality: N/A;  . Tubal ligation    . Left heart catheterization with coronary angiogram N/A 06/22/2014    Procedure: LEFT HEART CATHETERIZATION WITH CORONARY ANGIOGRAM;  Surgeon: Burnell Blanks, MD;  Location: [facility] CATH LAB;  Service: Cardiovascular;  Laterality: N/A;  . Eye surgery    . Brain surgery      1997 blood clot on the brain, then had to relieve fluid on the brain  . Multiple extractions with alveoloplasty N/A 08/01/2014    Procedure: EXTRACTIONS OF TEETH NUMBERS 7 8 9 10 11  AND 19 AND ALVEOLOPLASTY UPPER LEFT AND RIGHT QUADRANT;  Surgeon: Isac Caddy, DDS;  Location: [facility];  Service: Oral Surgery;  Laterality: N/A;  . Cardiac catheterization N/A 03/07/2015    Procedure: Left Heart Cath and Coronary Angiography;  Surgeon: Leonie Man, MD;  Location: [facility] CV LAB;  Service: Cardiovascular;  Laterality: N/A;  . Av fistula placement Left 05/10/2015    Procedure: CREATION OF LEFT UPPER ARM ARTERIOVENOUS (AV) FISTULA ;  Surgeon: Elam Dutch, MD;  Location: [facility] OR;  Service: Vascular;  Laterality: Left;   Family History  Problem Relation Age of Onset  . Cancer - Other Mother     "Stomach" Cancer  . Diabetes Mother   . Heart disease Mother   . Stomach cancer Mother   . Hypertension Mother   . Lymphoma Father   . Hypertension Father   . Kidney disease Paternal Grandmother   . Asthma Other   . Diabetes Sister   . Rheum arthritis Mother   . Rheum arthritis Father   . Heart attack Neg Hx   . Stroke Paternal Grandmother    Social History  Substance Use Topics  . Smoking status: Never Smoker   . Smokeless tobacco: Never Used  . Alcohol Use: No     Comment: rare   OB History    No data available     Review of Systems  Constitutional: Positive for diaphoresis. Negative for fever.  HENT: Negative.   Eyes: Negative.   Respiratory: Positive for shortness of breath. Negative for cough and wheezing.   Cardiovascular: Positive for chest pain and palpitations. Negative for leg swelling.  Gastrointestinal: Negative for nausea, vomiting, abdominal pain, diarrhea, constipation and blood in stool.  Genitourinary: Negative for dysuria and decreased urine volume (chronic).  Neurological: Negative for syncope, speech difficulty and headaches.  Hematological: Negative for adenopathy.  All other systems reviewed and are negative.     Allergies  Iohexol  Home Medications   Prior to Admission medications   Medication Sig Start Date End Date Taking? Authorizing Provider  acetaminophen (TYLENOL) 500 MG tablet Take 500  mg by mouth every 6 (six) hours as needed for mild pain.    Historical Provider, MD  albuterol (PROVENTIL HFA;VENTOLIN HFA) 108 (90 BASE) MCG/ACT inhaler Inhale 2 puffs into the lungs every 4 (four) hours as needed for wheezing or shortness of breath. 04/19/15   Renato Shin, MD  albuterol (PROVENTIL) (2.5 MG/3ML) 0.083% nebulizer solution Take 3 mLs (2.5 mg total) by nebulization every 4 (four) hours as needed for wheezing or shortness of breath. 12/08/13   Robbie Lis, MD  allopurinol (ZYLOPRIM) 100 MG tablet Take 1 tablet (100 mg total) by mouth daily.  07/21/15   Renato Shin, MD  amLODipine (NORVASC) 10 MG tablet Take 1 tablet (10 mg total) by mouth daily. 07/21/15   Renato Shin, MD  aspirin EC 81 MG tablet Take 1 tablet (81 mg total) by mouth daily. 03/16/15   Dayna N Dunn, PA-C  atorvastatin (LIPITOR) 40 MG tablet TAKE 1 TABLET BY MOUTH DAILY AT 6 PM 07/21/15   Renato Shin, MD  atorvastatin (LIPITOR) 40 MG tablet TAKE 1 TABLET BY MOUTH DAILY AT 6 PM 09/26/15   Renato Shin, MD  calcitRIOL (ROCALTROL) 0.25 MCG capsule Take 1 capsule (0.25 mcg total) by mouth daily. 07/21/15   Renato Shin, MD  clopidogrel (PLAVIX) 75 MG tablet TAKE 1 TABLET BY MOUTH DAILY 10/17/15   Renato Shin, MD  donepezil (ARICEPT) 5 MG tablet Take 1 tablet (5 mg total) by mouth at bedtime. 07/21/15   Renato Shin, MD  ezetimibe (ZETIA) 10 MG tablet TAKE 1 TABLET BY MOUTH EVERY MORNING 10/17/15   Renato Shin, MD  feeding supplement (GLUCERNA SHAKE) LIQD Take 237 mLs by mouth every morning.     Historical Provider, MD  Fluticasone-Salmeterol (ADVAIR DISKUS) 100-50 MCG/DOSE AEPB Inhale 1 puff into the lungs 2 (two) times daily. 04/19/15   Renato Shin, MD  gabapentin (NEURONTIN) 100 MG capsule TAKE 1 CAPSULE BY MOUTH EVERY NIGHT AT BEDTIME 10/16/15   Renato Shin, MD  hydrALAZINE (APRESOLINE) 25 MG tablet Take 1 tablet (25 mg total) by mouth 3 (three) times daily. 07/21/15   Renato Shin, MD  insulin NPH-regular Human (NOVOLIN 70/30) (70-30)  100 UNIT/ML injection Inject 8 Units into the skin daily with breakfast. 09/18/15   Renato Shin, MD  isosorbide mononitrate (IMDUR) 60 MG 24 hr tablet Take 60 mg by mouth daily. 04/05/15   Historical Provider, MD  isosorbide mononitrate (IMDUR) 60 MG 24 hr tablet TAKE 1 TABLET BY MOUTH DAILY 09/26/15   Dorothy Spark, MD  latanoprost (XALATAN) 0.005 % ophthalmic solution Place 1 drop into both eyes at bedtime.    Historical Provider, MD  nitroGLYCERIN (NITROSTAT) 0.4 MG SL tablet Place 0.4 mg under the tongue every 5 (five) minutes as needed for chest pain.     Historical Provider, MD  ONE TOUCH ULTRA TEST test strip USE TO TEST BLOOD SUGAR TWICE DAILY 07/14/15   Renato Shin, MD  pantoprazole (PROTONIX) 40 MG tablet Take 1 tablet (40 mg total) by mouth 2 (two) times daily. 07/21/15   Renato Shin, MD  pantoprazole (PROTONIX) 40 MG tablet TAKE 1 TABLET BY MOUTH TWICE DAILY 10/04/15   Renato Shin, MD  RANEXA 500 MG 12 hr tablet TAKE 1 TABLET BY MOUTH EVERY 12 HOURS 07/21/15   Gildardo Cranker, DO  sertraline (ZOLOFT) 100 MG tablet Take 1 tablet (100 mg total) by mouth daily. 07/21/15   Renato Shin, MD   BP 127/58 mmHg  Pulse 74  Temp(Src) 98.8 F (37.1 C) (Oral)  Resp 16  SpO2 100% Physical Exam  Constitutional: She is oriented to person, place, and time. She appears well-developed and well-nourished. No distress.  HENT:  Head: Normocephalic.  Mouth/Throat: Oropharynx is clear and moist.  Neck: Normal range of motion. Neck supple.  No JVD  Cardiovascular: Normal rate, regular rhythm and normal heart sounds.   No murmur heard. Pulmonary/Chest: Effort normal and breath sounds normal. No respiratory distress. She has no wheezes. She has no rales. She exhibits tenderness.  Abdominal: Soft. Bowel sounds are normal. She exhibits no distension. There is no tenderness. There is no  rebound and no guarding.  Musculoskeletal: Normal range of motion. She exhibits no edema or tenderness.  Lymphadenopathy:     She has no cervical adenopathy.  Neurological: She is alert and oriented to person, place, and time.  Skin: Skin is warm. She is not diaphoretic.  Vitals reviewed.   ED Course  Procedures (including critical care time) [place], ED - Abnormal; Notable for the following:    Troponin i, poc 0.41 (*)    All other components within normal limits  CBC  BASIC METABOLIC PANEL    Imaging Review Dg Chest 2 View  10/17/2015  CLINICAL DATA:  Chest pain for 1 day EXAM: CHEST  2 VIEW COMPARISON:  05/03/2015 FINDINGS: There is no focal parenchymal opacity. There is no pleural effusion or pneumothorax. There is stable cardiomegaly. There is a dual-lumen right-sided central venous catheter. The osseous structures are unremarkable. IMPRESSION: No active cardiopulmonary disease. Electronically Signed   By: Kathreen Devoid   On: 10/17/2015 14:44   I have personally reviewed and evaluated these images and lab results as part of my medical decision-making.   EKG Interpretation   Date/Time:  Tuesday Oct 17 2015 14:16:54 EDT Ventricular Rate:  77 PR Interval:  171 QRS Duration: 110 QT Interval:  453 QTC Calculation: 513 R Axis:   12 Text Interpretation:  Sinus rhythm Borderline abnrm T, anterolateral leads  Prolonged QT interval No significant change was found Confirmed by Wyvonnia Dusky   MD, STEPHEN [phone]) on 10/17/2015 2:36:47 PM Also confirmed by Wyvonnia Dusky  MD,  [place] [phone]), editor [place] CT, Leda Gauze [phone])  on 10/17/2015 2:47:55 PM      MDM   15:55: Attempted to have patient sit up. After about 2 minutes, patient started feeling weak and presyncopal, so he was laid back. Patient was previously stood up and was extremely unsteady.  NSTEMI Patient with significant CAD history. Heart Score of 9. S/p ASA 324mg  and nitro x1. Patient with intermittent chest pain currently and troponin elevation to 0.41, baseline around 0.16, although may be affected by dialysis status. EKG  appears stable with prolonged QTc. Given nitro paste in ED. Not requiring oxygen. CXR stable. Called Cardiology, who will see. Called [facility] for admission. Vitals stable. Telemetry bed.    Mariel Aloe, MD 10/17/15 1649  Ezequiel Essex, MD 10/17/15 [phone]

## 2015-10-18 ENCOUNTER — Ambulatory Visit: Payer: Medicare Other | Admitting: Endocrinology

## 2015-10-18 DIAGNOSIS — E876 Hypokalemia: Secondary | ICD-10-CM | POA: Diagnosis present

## 2015-10-18 DIAGNOSIS — E11319 Type 2 diabetes mellitus with unspecified diabetic retinopathy without macular edema: Secondary | ICD-10-CM | POA: Diagnosis present

## 2015-10-18 DIAGNOSIS — J449 Chronic obstructive pulmonary disease, unspecified: Secondary | ICD-10-CM | POA: Diagnosis present

## 2015-10-18 DIAGNOSIS — Z8542 Personal history of malignant neoplasm of other parts of uterus: Secondary | ICD-10-CM | POA: Diagnosis not present

## 2015-10-18 DIAGNOSIS — N186 End stage renal disease: Secondary | ICD-10-CM | POA: Diagnosis not present

## 2015-10-18 DIAGNOSIS — E1122 Type 2 diabetes mellitus with diabetic chronic kidney disease: Secondary | ICD-10-CM | POA: Diagnosis present

## 2015-10-18 DIAGNOSIS — K219 Gastro-esophageal reflux disease without esophagitis: Secondary | ICD-10-CM | POA: Diagnosis present

## 2015-10-18 DIAGNOSIS — G4733 Obstructive sleep apnea (adult) (pediatric): Secondary | ICD-10-CM | POA: Diagnosis present

## 2015-10-18 DIAGNOSIS — R0789 Other chest pain: Secondary | ICD-10-CM | POA: Diagnosis present

## 2015-10-18 DIAGNOSIS — I214 Non-ST elevation (NSTEMI) myocardial infarction: Secondary | ICD-10-CM | POA: Diagnosis not present

## 2015-10-18 DIAGNOSIS — I2583 Coronary atherosclerosis due to lipid rich plaque: Secondary | ICD-10-CM | POA: Diagnosis present

## 2015-10-18 DIAGNOSIS — J42 Unspecified chronic bronchitis: Secondary | ICD-10-CM

## 2015-10-18 DIAGNOSIS — Z89512 Acquired absence of left leg below knee: Secondary | ICD-10-CM | POA: Diagnosis not present

## 2015-10-18 DIAGNOSIS — Z992 Dependence on renal dialysis: Secondary | ICD-10-CM | POA: Diagnosis not present

## 2015-10-18 DIAGNOSIS — E785 Hyperlipidemia, unspecified: Secondary | ICD-10-CM | POA: Diagnosis present

## 2015-10-18 DIAGNOSIS — Z0289 Encounter for other administrative examinations: Secondary | ICD-10-CM

## 2015-10-18 DIAGNOSIS — I5032 Chronic diastolic (congestive) heart failure: Secondary | ICD-10-CM | POA: Diagnosis not present

## 2015-10-18 DIAGNOSIS — I2510 Atherosclerotic heart disease of native coronary artery without angina pectoris: Secondary | ICD-10-CM | POA: Diagnosis present

## 2015-10-18 DIAGNOSIS — Z7902 Long term (current) use of antithrombotics/antiplatelets: Secondary | ICD-10-CM | POA: Diagnosis not present

## 2015-10-18 DIAGNOSIS — I132 Hypertensive heart and chronic kidney disease with heart failure and with stage 5 chronic kidney disease, or end stage renal disease: Secondary | ICD-10-CM | POA: Diagnosis present

## 2015-10-18 DIAGNOSIS — N2581 Secondary hyperparathyroidism of renal origin: Secondary | ICD-10-CM | POA: Diagnosis present

## 2015-10-18 DIAGNOSIS — Z794 Long term (current) use of insulin: Secondary | ICD-10-CM | POA: Diagnosis not present

## 2015-10-18 DIAGNOSIS — R079 Chest pain, unspecified: Secondary | ICD-10-CM | POA: Diagnosis not present

## 2015-10-18 DIAGNOSIS — I5042 Chronic combined systolic (congestive) and diastolic (congestive) heart failure: Secondary | ICD-10-CM | POA: Diagnosis present

## 2015-10-18 DIAGNOSIS — E1151 Type 2 diabetes mellitus with diabetic peripheral angiopathy without gangrene: Secondary | ICD-10-CM | POA: Diagnosis present

## 2015-10-18 LAB — GLUCOSE, CAPILLARY
GLUCOSE-CAPILLARY: 195 mg/dL — AB (ref 65–99)
Glucose-Capillary: 149 mg/dL — ABNORMAL HIGH (ref 65–99)
Glucose-Capillary: 230 mg/dL — ABNORMAL HIGH (ref 65–99)
Glucose-Capillary: 127 mg/dL — ABNORMAL HIGH (ref 65–99)

## 2015-10-18 LAB — TROPONIN I
TROPONIN I: 0.36 ng/mL — AB (ref <0.031)
TROPONIN I: 0.27 ng/mL — AB (ref <0.031)

## 2015-10-18 LAB — HEPARIN LEVEL (UNFRACTIONATED)
HEPARIN UNFRACTIONATED: 0.49 [IU]/mL (ref 0.30–0.70)
Heparin Unfractionated: 0.74 IU/mL — ABNORMAL HIGH (ref 0.30–0.70)

## 2015-10-18 LAB — HEMOGLOBIN A1C
HEMOGLOBIN A1C: 6.1 % — AB (ref 4.8–5.6)
MEAN PLASMA GLUCOSE: 128 mg/dL

## 2015-10-18 MED ORDER — ALUM & MAG HYDROXIDE-SIMETH 200-200-20 MG/5ML PO SUSP
30.0000 mL | ORAL | Status: DC | PRN
  Administered 2015-10-18: 30 mL via ORAL
  Filled 2015-10-18: qty 30

## 2015-10-18 MED ORDER — DOCUSATE SODIUM 100 MG PO CAPS
100.0000 mg | ORAL_CAPSULE | Freq: Two times a day (BID) | ORAL | Status: DC
  Administered 2015-10-18 – 2015-10-19 (×2): 100 mg via ORAL
  Filled 2015-10-18 (×2): qty 1

## 2015-10-18 MED FILL — Insulin NPH (Human) (Isophane) Inj 100 Unit/ML: SUBCUTANEOUS | Qty: 10 | Status: AC

## 2015-10-18 NOTE — Progress Notes (Signed)
Pt seen and examined, admitted earlier this AM for chest pain on HD yesterday.   Currently with minimal chest pain, improved, cont to be tender to palpation. No acute issues, no complaints.   Troponin stable, improving. Cardiology following.  Cont present care on Heparin gtt, appropriate cardiac medications, repeat troponin pending this AM. Await further cardiology recommendations, anticipate continued close observation today and medical management.

## 2015-10-18 NOTE — Progress Notes (Signed)
ANTICOAGULATION CONSULT NOTE - Follow-up Consult  Pharmacy Consult for Heparin  Indication: chest pain/ACS  Allergies  Allergen Reactions  . Iohexol Itching and Rash     Code: RASH, Desc: [place] ON PT'S CHART ALLERGIC TO IV DYE 09/04/07/RM, Onset Date: [id number]     Patient Measurements: Height: 5\' 6"  (167.6 cm) Weight: 164 lb 4.8 oz (74.526 kg) IBW/kg (Calculated) : 59.3 Heparin Dosing Weight: 74.2 kg  Vital Signs: Temp: 98.5 F (36.9 C) (05/03 0018) Temp Source: Oral (05/03 0018) BP: 127/39 mmHg (05/03 0018) Pulse Rate: 66 (05/03 0018)  Labs:  Recent Labs  10/17/15 1458 10/17/15 1851 10/18/15 0310  HGB 12.3  --   --   HCT 37.0  --   --   PLT 277  --   --   HEPARINUNFRC  --   --  0.74*  CREATININE 3.30*  --   --   TROPONINI  --  0.37*  --     Estimated Creatinine Clearance: 13.1 mL/min (by C-G formula based on Cr of 3.3).   Assessment: 82 year old female on heparin for r/o ACS. Heparin level slightly supratherapeutic on 850 units/hr. No issues with line or bleeding reported per RN. Per cards, no plan for further cardiac evaluation unless troponins significantly increase.  Goal of Therapy:  Heparin level 0.3-0.7 units/ml Monitor platelets by anticoagulation protocol: Yes   Plan:  Decrease heparin drip to 750 units/hr Heparin level in 8 hours.   Sherlon Handing, PharmD, BCPS Clinical pharmacist, pager [phone]  10/18/2015,4:11 AM

## 2015-10-18 NOTE — Progress Notes (Signed)
Pt diastolic BP has been 0000000 today, pt refused hydralazine during the morning. BP 132/41 tonight. Tylene Fantasia asked for parameters on BP medicine. Order modified.

## 2015-10-18 NOTE — Progress Notes (Signed)
Pt c/o burning in epigastric area after eating, states it feels like indigestion. PRN cardiology maalox given and symptoms relieved.

## 2015-10-18 NOTE — Progress Notes (Signed)
ANTICOAGULATION CONSULT NOTE - Follow-up Consult  Pharmacy Consult for Heparin  Indication: chest pain/ACS  Allergies  Allergen Reactions  . Iohexol Itching and Rash     Code: RASH, Desc: [place] ON PT'S CHART ALLERGIC TO IV DYE 09/04/07/RM, Onset Date: [id number]     Patient Measurements: Height: 5\' 6"  (167.6 cm) Weight: 164 lb 14.4 oz (74.798 kg) (bedscale) IBW/kg (Calculated) : 59.3 Heparin Dosing Weight: 74.2 kg  Vital Signs: Temp: 98.2 F (36.8 C) (05/03 1235) Temp Source: Oral (05/03 1235) BP: 141/56 mmHg (05/03 1235) Pulse Rate: 63 (05/03 1235)  Labs:  Recent Labs  10/17/15 1458 10/17/15 1851 10/18/15 0310 10/18/15 0950  HGB 12.3  --   --   --   HCT 37.0  --   --   --   PLT 277  --   --   --   HEPARINUNFRC  --   --  0.74* 0.49  CREATININE 3.30*  --   --   --   TROPONINI  --  0.37* 0.36*  --     Estimated Creatinine Clearance: 13.1 mL/min (by C-G formula based on Cr of 3.3).   Assessment: 82 year old female on heparin for r/o ACS.  Heparin level now therapeutic  Goal of Therapy:  Heparin level 0.3-0.7 units/ml Monitor platelets by anticoagulation protocol: Yes   Plan:  Continue heparin at 750 units/hr Follow up AM labs  Thank you Anette Guarneri, PharmD [phone]   10/18/2015,12:59 PM

## 2015-10-18 NOTE — Progress Notes (Signed)
Subjective: She reports three episodes of CP earlier this morning.  Each time it starts in the left side of her chest but then radiates down and right into her abdomen.  Objective: Vital signs in last 24 hours: Temp:  [98.1 F (36.7 C)-98.8 F (37.1 C)] 98.1 F (36.7 C) (05/03 0653) Pulse Rate:  [61-79] 61 (05/03 0653) Resp:  [15-22] 18 (05/03 0653) BP: (115-140)/(39-58) 130/39 mmHg (05/03 0927) SpO2:  [95 %-100 %] 96 % (05/03 0831) Weight:  [164 lb 4.8 oz (74.526 kg)-164 lb 14.4 oz (74.798 kg)] 164 lb 14.4 oz (74.798 kg) (05/03 0653) Last BM Date: 10/16/15  Intake/Output from previous day: 05/02 0701 - 05/03 0700 In: 201.2 [P.O.:120; I.V.:81.2] Out: 0  Intake/Output this shift: Total I/O In: 240 [P.O.:240] Out: -   Medications Scheduled Meds: . allopurinol  100 mg Oral Daily  . amLODipine  10 mg Oral Daily  . aspirin  324 mg Oral Once  . aspirin EC  81 mg Oral Daily  . atorvastatin  40 mg Oral q1800  . calcitRIOL  0.25 mcg Oral Daily  . clopidogrel  75 mg Oral Daily  . donepezil  5 mg Oral QHS  . ezetimibe  10 mg Oral q morning - 10a  . feeding supplement (GLUCERNA SHAKE)  237 mL Oral q morning - 10a  . gabapentin  100 mg Oral QHS  . hydrALAZINE  25 mg Oral TID  . insulin aspart  0-5 Units Subcutaneous QHS  . insulin aspart  0-9 Units Subcutaneous TID WC  . insulin NPH Human  8 Units Subcutaneous QAC breakfast  . isosorbide mononitrate  60 mg Oral Daily  . latanoprost  1 drop Both Eyes QHS  . mometasone-formoterol  2 puff Inhalation BID  . pantoprazole  40 mg Oral BID  . ranolazine  500 mg Oral Q12H  . sertraline  100 mg Oral Daily   Continuous Infusions: . heparin 750 Units/hr (10/18/15 0429)   PRN Meds:.acetaminophen, albuterol, morphine injection, nitroGLYCERIN  PE: General appearance: alert, cooperative and no distress Lungs: clear to auscultation bilaterally Heart: regular rate and rhythm, S1, S2 normal, no murmur, click, rub or gallop Abdomen:  +BS, tender to palaption in the central abd. Extremities: No right LEE Pulses: 2+ radials and right DP. Skin: Warm and dry Neurologic: Grossly normal  Lab Results:   Recent Labs  10/17/15 1458  WBC 9.7  HGB 12.3  HCT 37.0  PLT 277   BMET  Recent Labs  10/17/15 1458  NA 138  K 3.4*  CL 99*  CO2 28  GLUCOSE 174*  BUN 31*  CREATININE 3.30*  CALCIUM 9.2    Assessment/Plan   82 yo female with PMHx of chronic combined CHF (EF 45-50% by LHC in September 2016), CAD (NSTEMI 06/2014 with LHC 9/16 showing moderate disease treated medically and recent cath 02/2015 with stable anatomy), controlled T2DM, HTN, HLD, OSA (on CPAP), PVD s/p L BKA 08/2012, ESRD on HD, COPD, and bradycardia (not on BB due to this) who presents to the ED on 10/17/15 with complaint of chest pain.    Elevated troponin Three episodes of CP earlier this morning.  Lasts ~34mins each.   Each time it starts in the left side of her chest but then radiates down and right into her abdomen.  Troponin 0.37, 0.36.   Flat trend in the setting of ESRD/HD.  LHC in sept 2016 with stable anatomy.  Pain is reproducible with palpation in the central to upper  abdomen.        Will review echo when complete but her pain is reproducible.   ASA, statin, zetia, plavix, imdur 60, ranexa 500bid.  No BB due to bradycardia.    Short run of atrial tach on tele.     Chronic diastolic congestive heart failure, NYHA class 3 (HCC)  Appears euvolemic     Essential hypertension: Stable on amlodipine 10, hydralazine 25 TID, imdur 60,    Coronary artery disease due to lipid rich plaque; 70% ostial-proximal AV Groove Cx - not good PCI                            target   Hyperlipidemia: On statin and zetia       Obstructive sleep apnea    PERIPHERAL VASCULAR DISEASE   CKD (chronic kidney disease) stage V requiring chronic dialysis (HCC)   Gait instability: Status post left BKA   Controlled insulin dependent diabetes mellitus (HCC)   COPD (chronic  obstructive pulmonary disease) (HCC)   Prolonged Q-T interval on ECG  She is persistently high 470-463ms.      Chest pain syndrome      HAGER, BRYAN PA-C 10/18/2015 12:08 PM  The patient has been seen in conjunction with Tenny Craw, PA-C. All aspects of care have been considered and discussed. The patient has been personally interviewed, examined, and all clinical data has been reviewed.   Based upon history and my exam, I agree that there is a definite musculoskeletal component to the patient's chest pain. Palpation along the left sternal border reproduces the discomfort. Also when it occurs if she is able to get in a certain position on her right side, the discomfort improves.  Cardiac markers are elevated but flat.  If markers remain flat, and echo does not reveal evidence of discrete regional wall motion abnormality, my clinical suspicion would be that this is non-ischemic chest pain. If that be the case, I would not offer or recommend further ischemic evaluation. We will await completion of the database.

## 2015-10-19 ENCOUNTER — Inpatient Hospital Stay (HOSPITAL_COMMUNITY): Payer: Medicare Other

## 2015-10-19 DIAGNOSIS — R079 Chest pain, unspecified: Secondary | ICD-10-CM

## 2015-10-19 LAB — RENAL FUNCTION PANEL
ALBUMIN: 3.4 g/dL — AB (ref 3.5–5.0)
ANION GAP: 11 (ref 5–15)
BUN: 51 mg/dL — ABNORMAL HIGH (ref 6–20)
CALCIUM: 8.7 mg/dL — AB (ref 8.9–10.3)
CO2: 27 mmol/L (ref 22–32)
CREATININE: 3.9 mg/dL — AB (ref 0.44–1.00)
Chloride: 98 mmol/L — ABNORMAL LOW (ref 101–111)
GFR calc non Af Amer: 10 mL/min — ABNORMAL LOW (ref >60)
GFR, EST AFRICAN AMERICAN: 11 mL/min — AB (ref >60)
Glucose, Bld: 222 mg/dL — ABNORMAL HIGH (ref 65–99)
PHOSPHORUS: 3.9 mg/dL (ref 2.5–4.6)
Potassium: 4.2 mmol/L (ref 3.5–5.1)
SODIUM: 136 mmol/L (ref 135–145)

## 2015-10-19 LAB — GLUCOSE, CAPILLARY
GLUCOSE-CAPILLARY: 248 mg/dL — AB (ref 65–99)
Glucose-Capillary: 219 mg/dL — ABNORMAL HIGH (ref 65–99)
Glucose-Capillary: 256 mg/dL — ABNORMAL HIGH (ref 65–99)

## 2015-10-19 LAB — HEPARIN LEVEL (UNFRACTIONATED): Heparin Unfractionated: 0.27 IU/mL — ABNORMAL LOW (ref 0.30–0.70)

## 2015-10-19 LAB — ECHOCARDIOGRAM COMPLETE
Height: 66 in
WEIGHTICAEL: 2696 [oz_av]

## 2015-10-19 LAB — BASIC METABOLIC PANEL
ANION GAP: 13 (ref 5–15)
BUN: 50 mg/dL — AB (ref 6–20)
CO2: 25 mmol/L (ref 22–32)
Calcium: 8.8 mg/dL — ABNORMAL LOW (ref 8.9–10.3)
Chloride: 98 mmol/L — ABNORMAL LOW (ref 101–111)
Creatinine, Ser: 3.96 mg/dL — ABNORMAL HIGH (ref 0.44–1.00)
GFR calc Af Amer: 11 mL/min — ABNORMAL LOW (ref >60)
GFR calc non Af Amer: 10 mL/min — ABNORMAL LOW (ref >60)
GLUCOSE: 238 mg/dL — AB (ref 65–99)
POTASSIUM: 3.8 mmol/L (ref 3.5–5.1)
Sodium: 136 mmol/L (ref 135–145)

## 2015-10-19 LAB — CBC
HCT: 32.7 % — ABNORMAL LOW (ref 36.0–46.0)
HEMOGLOBIN: 11 g/dL — AB (ref 12.0–15.0)
MCH: 31.8 pg (ref 26.0–34.0)
MCHC: 33.6 g/dL (ref 30.0–36.0)
MCV: 94.5 fL (ref 78.0–100.0)
PLATELETS: 255 10*3/uL (ref 150–400)
RBC: 3.46 MIL/uL — AB (ref 3.87–5.11)
RDW: 14.5 % (ref 11.5–15.5)
WBC: 7.8 10*3/uL (ref 4.0–10.5)

## 2015-10-19 MED ORDER — SODIUM CHLORIDE 0.9 % IV SOLN
125.0000 mg | INTRAVENOUS | Status: DC
  Administered 2015-10-19: 125 mg via INTRAVENOUS
  Filled 2015-10-19: qty 10

## 2015-10-19 MED ORDER — DOXERCALCIFEROL 0.5 MCG PO CAPS: 1.0000 ug | ORAL_CAPSULE | ORAL | Status: DC

## 2015-10-19 NOTE — Progress Notes (Signed)
Inpatient Diabetes Program Recommendations  AACE/ADA: New Consensus Statement on Inpatient Glycemic Control (2015)  Target Ranges:  Prepandial:   less than 140 mg/dL      Peak postprandial:   less than 180 mg/dL (1-2 hours)      Critically ill patients:  140 - 180 mg/dL  Results for [name], [name] (MRN [id number]) as of 10/19/2015 11:54  Ref. Range 10/18/2015 16:46 10/18/2015 21:36 10/19/2015 05:47 10/19/2015 10:26 10/19/2015 11:22  Glucose-Capillary Latest Ref Range: 65-99 mg/dL 195 (H) 127 (H) 219 (H) 256 (H) 248 (H)   Review of Glycemic Control  Diabetes history: Dm Type 2 Outpatient Diabetes medications: Novolin NPH 70/30 insulin 8 units ac breakfast Current orders for Inpatient glycemic control: NPH ac breakfast + Novolog sensitive correction 0-9 units + 0-5 units hs  Inpatient Diabetes Program Recommendations:  Noted patient is drinking Nepro.  Please consider adding meal coverage 3 units tid (hold if eats < 50%) with meals to help cover carbohydrates from Nepro feedings.  Thank you, Nani Gasser. Berneta Sconyers, RN, MSN, CDE Inpatient Glycemic Control Team Team Pager [phone] (8am-5pm) 10/19/2015 11:59 AM

## 2015-10-19 NOTE — Clinical Documentation Improvement (Signed)
Internal Medicine  Can chest pain be further specified?   GERD  CAD  Angina  Other  Clinically Undetermined  Document any associated diagnoses/conditions. Supporting Information:  10/19/15: ECHO pending Continue Plavix, Zetia, Lipitor, and baby aspirin 10/18/15: note "c/o burning in epigastric area after eating, states it feels like indigestion. PRN cardiology maalox given and symptoms relieved."   Please exercise your independent, professional judgment when responding. A specific answer is not anticipated or expected. Please update your documentation within the medical record to reflect your response to this query. Thank you  Thank You,  [place] [phone]

## 2015-10-19 NOTE — Progress Notes (Signed)
  Echocardiogram 2D Echocardiogram has been performed.  Aggie Cosier 10/19/2015, 10:51 AM

## 2015-10-19 NOTE — Consult Note (Signed)
[facility] Renal Consultation Note  Indication for Consultation:  Management of ESRD/hemodialysis; anemia, hypertension/volume and secondary hyperparathyroidism  HPI: [name] is a 82 y.o. female admitted with Chest pain after 1.5 hr of OP HD 10/17/15 Tuesday. [facility] TTS). Cardiology Consulted on admit and Dx so far = Atypical chest discomfort  Thought to be related to musculoskeletal discomfort/ with 2 d echo pending has  elevated troponin in the setting of chronic kidney failure. Now denies significant chest pain.CXR on Admit showing no volume issue. Has HO  DM / HTn/ CAD/ LBKA/ Asthma/ OSA on CPAP.Lives with husband at home.   Started HD Nov. 2016, using AVF recently (still has permcath in) problems sticking AVF and sp 10/14/15 Dr.Lin PTA 70% outflow cephalic vein arch (not confluence) stenosis and  01% outflow cephalic vein stenosis = "ok to cannulate ."She tells me no chest pain at home.had chest pain on hd not as bad in past with low bps. Reviewing OP HD records =leaving above edw /compliant with HD time and wt gains minimal. Reports appetite improving since on HD.   Reports some constipation ,denies fevers, chills, abd pain, cough.       Past Medical History  Diagnosis Date  . Uterine cancer ([place])   . Diabetes mellitus     type II; peripheral neuropathy  . Hypertension   . GERD (gastroesophageal reflux disease)   . Peripheral vascular disease ([place])     a. s/p L BKA 08/2012.  . Obesity   . Dyslipidemia   . Chronic combined systolic and diastolic CHF (congestive heart failure) (H[facility])     a. EF 50-55%, mild LVH and grade 1 diast. Dysfxn b. Grade 3 Diastolic Dysfunction 74/9449. b. 02/2015: EF 45-50% by cath.  . Osteoarthritis cervical spine   . DM retinopathy ([place])   . Sleep apnea     with CPAP  . History of shingles   . COPD (chronic obstructive pulmonary disease) ([place])   . Depression   . Peptic ulcer disease     duodenal  . Peptic stricture of esophagus    . Hiatal hernia   . Diverticulosis   . Arthritis   . CAD (coronary artery disease)     a. s/p multiple caths with nonobs CAD;   b. cath 1/10: pLAD 20%, mLAD 40%, pCFX 20%, mCFX 40%, pRCA 60-70%;   c.  Myoview 06/02/12: Low anterior wall scar, no ischemia, EF 37%. d. cath 06/20/2014 70% mid LCx, medical therapy, high risk for PCI due to close proximity to large OM e. cath 03/07/15 pro RCA 50% & pro to mid Cx 75%, both stable, LVEDP 33-9m Hg-> medical management  . Cardiomyopathy ([place]     a. Echo 05/31/12: EF 40-45%. b. Echo 06/2014: EF 50-55%. c. Cath 02/2015: EF 45-50%.  . Asthma     Mild  . Pruritic condition     Idiopathic  . Zoster   . Noncompliance   . CKD (chronic kidney disease), stage III     Dr. DLorrene Reid . Bradycardia     a. Not on BB due to this.  . Shortness of breath dyspnea     Past Surgical History  Procedure Laterality Date  . Tubal ligation  1967  . Knee arthroscopy  10/1998    Left  . Craniotomy  1997    Left for SDH  . Cataract extraction, bilateral  2005  . Hernia repair    . Esophagogastroduodenoscopy  04/04/2004  . Spine surgery  C-spine and lumbar surgery  . Cholecystectomy  2010  . Cardiac catheterization    . Dexa  7/05  . Amputation Left 09/04/2012    Procedure: AMPUTATION BELOW KNEE;  Surgeon: Angelia Mould, MD;  Location: [place];  Service: Vascular;  Laterality: Left;  . Abdominal angiogram N/A 08/31/2012    Procedure: ABDOMINAL ANGIOGRAM with runoff poss intervention;  Surgeon: Angelia Mould, MD;  Location: [facility] CATH LAB;  Service: Cardiovascular;  Laterality: N/A;  . Tubal ligation    . Left heart catheterization with coronary angiogram N/A 06/22/2014    Procedure: LEFT HEART CATHETERIZATION WITH CORONARY ANGIOGRAM;  Surgeon: Burnell Blanks, MD;  Location: [facility] CATH LAB;  Service: Cardiovascular;  Laterality: N/A;  . Eye surgery    . Brain surgery      1997 blood clot on the brain, then had to relieve fluid on the brain  .  Multiple extractions with alveoloplasty N/A 08/01/2014    Procedure: EXTRACTIONS OF TEETH NUMBERS '7 8 9 10 11 '$ AND 19 AND ALVEOLOPLASTY UPPER LEFT AND RIGHT QUADRANT;  Surgeon: Isac Caddy, DDS;  Location: [place];  Service: Oral Surgery;  Laterality: N/A;  . Cardiac catheterization N/A 03/07/2015    Procedure: Left Heart Cath and Coronary Angiography;  Surgeon: Leonie Man, MD;  Location: [place] CV LAB;  Service: Cardiovascular;  Laterality: N/A;  . Av fistula placement Left 05/10/2015    Procedure: CREATION OF LEFT UPPER ARM ARTERIOVENOUS (AV) FISTULA ;  Surgeon: Elam Dutch, MD;  Location: [facility] OR;  Service: Vascular;  Laterality: Left;      Family History  Problem Relation Age of Onset  . Cancer - Other Mother     "Stomach" Cancer  . Diabetes Mother   . Heart disease Mother   . Stomach cancer Mother   . Hypertension Mother   . Lymphoma Father   . Hypertension Father   . Kidney disease Paternal Grandmother   . Asthma Other   . Diabetes Sister   . Rheum arthritis Mother   . Rheum arthritis Father   . Heart attack Neg Hx   . Stroke Paternal Grandmother       reports that she has never smoked. She has never used smokeless tobacco. She reports that she does not drink alcohol or use illicit drugs.   Allergies  Allergen Reactions  . Iohexol Itching and Rash     Code: RASH, Desc: [place] ON PT'S CHART ALLERGIC TO IV DYE 09/04/07/RM, Onset Date: [id number]     Prior to Admission medications   Medication Sig Start Date End Date Taking? Authorizing Provider  acetaminophen (TYLENOL) 500 MG tablet Take 500 mg by mouth every 6 (six) hours as needed for mild pain.   Yes Historical Provider, MD  albuterol (PROVENTIL HFA;VENTOLIN HFA) 108 (90 BASE) MCG/ACT inhaler Inhale 2 puffs into the lungs every 4 (four) hours as needed for wheezing or shortness of breath. 04/19/15  Yes Renato Shin, MD  allopurinol (ZYLOPRIM) 100 MG tablet Take 1 tablet (100 mg total) by mouth  daily. 07/21/15  Yes Renato Shin, MD  amLODipine (NORVASC) 10 MG tablet Take 1 tablet (10 mg total) by mouth daily. 07/21/15  Yes Renato Shin, MD  aspirin EC 81 MG tablet Take 1 tablet (81 mg total) by mouth daily. 03/16/15  Yes Dayna N Dunn, PA-C  atorvastatin (LIPITOR) 40 MG tablet TAKE 1 TABLET BY MOUTH DAILY AT 6 PM 07/21/15  Yes Renato Shin, MD  calcitRIOL (ROCALTROL) 0.25  MCG capsule Take 1 capsule (0.25 mcg total) by mouth daily. 07/21/15  Yes Renato Shin, MD  clopidogrel (PLAVIX) 75 MG tablet TAKE 1 TABLET BY MOUTH DAILY 10/17/15  Yes Renato Shin, MD  donepezil (ARICEPT) 5 MG tablet Take 1 tablet (5 mg total) by mouth at bedtime. 07/21/15  Yes Renato Shin, MD  ezetimibe (ZETIA) 10 MG tablet TAKE 1 TABLET BY MOUTH EVERY MORNING 10/17/15  Yes Renato Shin, MD  feeding supplement (GLUCERNA SHAKE) LIQD Take 237 mLs by mouth every morning.    Yes Historical Provider, MD  Fluticasone-Salmeterol (ADVAIR DISKUS) 100-50 MCG/DOSE AEPB Inhale 1 puff into the lungs 2 (two) times daily. 04/19/15  Yes Renato Shin, MD  gabapentin (NEURONTIN) 100 MG capsule TAKE 1 CAPSULE BY MOUTH EVERY NIGHT AT BEDTIME 10/16/15  Yes Renato Shin, MD  hydrALAZINE (APRESOLINE) 25 MG tablet Take 1 tablet (25 mg total) by mouth 3 (three) times daily. 07/21/15  Yes Renato Shin, MD  insulin NPH-regular Human (NOVOLIN 70/30) (70-30) 100 UNIT/ML injection Inject 8 Units into the skin daily with breakfast. 09/18/15  Yes Renato Shin, MD  isosorbide mononitrate (IMDUR) 60 MG 24 hr tablet TAKE 1 TABLET BY MOUTH DAILY 09/26/15  Yes Dorothy Spark, MD  latanoprost (XALATAN) 0.005 % ophthalmic solution Place 1 drop into both eyes at bedtime.   Yes Historical Provider, MD  nitroGLYCERIN (NITROSTAT) 0.4 MG SL tablet Place 0.4 mg under the tongue every 5 (five) minutes as needed for chest pain.    Yes Historical Provider, MD  pantoprazole (PROTONIX) 40 MG tablet Take 1 tablet (40 mg total) by mouth 2 (two) times daily. 07/21/15  Yes Renato Shin, MD   RANEXA 500 MG 12 hr tablet TAKE 1 TABLET BY MOUTH EVERY 12 HOURS 07/21/15  Yes Gildardo Cranker, DO  sertraline (ZOLOFT) 100 MG tablet Take 1 tablet (100 mg total) by mouth daily. 07/21/15  Yes Renato Shin, MD  ONE TOUCH ULTRA TEST test strip USE TO TEST BLOOD SUGAR TWICE DAILY 07/14/15   Renato Shin, MD    FXT:[id number], albuterol, morphine injection, nitroGLYCERIN  Results for orders placed or performed during the hospital encounter of 10/17/15 (from the past 48 hour(s))  Basic metabolic panel     Status: Abnormal   Collection Time: 10/17/15  2:58 PM  Result Value Ref Range   Sodium 138 135 - 145 mmol/L   Potassium 3.4 (L) 3.5 - 5.1 mmol/L    Comment: SLIGHT HEMOLYSIS   Chloride 99 (L) 101 - 111 mmol/L   CO2 28 22 - 32 mmol/L   Glucose, Bld 174 (H) 65 - 99 mg/dL   BUN 31 (H) 6 - 20 mg/dL   Creatinine, Ser 3.30 (H) 0.44 - 1.00 mg/dL   Calcium 9.2 8.9 - 10.3 mg/dL   GFR calc non Af Amer 12 (L) >60 mL/min   GFR calc Af Amer 14 (L) >60 mL/min    Comment: (NOTE) The eGFR has been calculated using the CKD EPI equation. This calculation has not been validated in all clinical situations. eGFR's persistently <60 mL/min signify possible Chronic Kidney Disease.    Anion gap 11 5 - 15  CBC     Status: None   Collection Time: 10/17/15  2:58 PM  Result Value Ref Range   WBC 9.7 4.0 - 10.5 K/uL   RBC 4.05 3.87 - 5.11 MIL/uL   Hemoglobin 12.3 12.0 - 15.0 g/dL   HCT 37.0 36.0 - 46.0 %   MCV 91.4 78.0 - 100.0 fL  MCH 30.4 26.0 - 34.0 pg   MCHC 33.2 30.0 - 36.0 g/dL   RDW 14.1 11.5 - 15.5 %   Platelets 277 150 - 400 K/uL  I-stat troponin, ED     Status: Abnormal   Collection Time: 10/17/15  3:01 PM  Result Value Ref Range   Troponin i, poc 0.41 (HH) 0.00 - 0.08 ng/mL   Comment NOTIFIED PHYSICIAN    Comment 3            Comment: Due to the release kinetics of cTnI, a negative result within the first hours of the onset of symptoms does not rule out myocardial infarction with  certainty. If myocardial infarction is still suspected, repeat the test at appropriate intervals.   Hemoglobin A1c     Status: Abnormal   Collection Time: 10/17/15  4:36 PM  Result Value Ref Range   Hgb A1c MFr Bld 6.1 (H) 4.8 - 5.6 %    Comment: (NOTE)         Pre-diabetes: 5.7 - 6.4         Diabetes: >6.4         Glycemic control for adults with diabetes: <7.0    Mean Plasma Glucose 128 mg/dL    Comment: (NOTE) Performed At: [facility], [place] [id number] Lindon Romp MD [id number]   CBG monitoring, ED     Status: Abnormal   Collection Time: 10/17/15  5:43 PM  Result Value Ref Range   Glucose-Capillary 151 (H) 65 - 99 mg/dL  MRSA PCR Screening     Status: None   Collection Time: 10/17/15  6:19 PM  Result Value Ref Range   MRSA by PCR NEGATIVE NEGATIVE    Comment:        The GeneXpert MRSA Assay (FDA approved for NASAL specimens only), is one component of a comprehensive MRSA colonization surveillance program. It is not intended to diagnose MRSA infection nor to guide or monitor treatment for MRSA infections.   Culture, blood (Routine X 2) w Reflex to ID Panel     Status: None (Preliminary result)   Collection Time: 10/17/15  6:48 PM  Result Value Ref Range   Specimen Description BLOOD RIGHT FOREARM    Special Requests IN PEDIATRIC BOTTLE 4CC    Culture NO GROWTH < 24 HOURS    Report Status PENDING   Culture, blood (Routine X 2) w Reflex to ID Panel     Status: None (Preliminary result)   Collection Time: 10/17/15  6:50 PM  Result Value Ref Range   Specimen Description BLOOD RIGHT HAND    Special Requests BOTTLES DRAWN AEROBIC AND ANAEROBIC 10CC     Culture NO GROWTH < 24 HOURS    Report Status PENDING   Troponin I (q 6hr x 3)     Status: Abnormal   Collection Time: 10/17/15  6:51 PM  Result Value Ref Range   Troponin I 0.37 (H) <0.031 ng/mL    Comment:        PERSISTENTLY INCREASED TROPONIN VALUES IN THE RANGE OF  0.04-0.49 ng/mL CAN BE SEEN IN:       -UNSTABLE ANGINA       -CONGESTIVE HEART FAILURE       -MYOCARDITIS       -CHEST TRAUMA       -ARRYHTHMIAS       -LATE PRESENTING MYOCARDIAL INFARCTION       -COPD   CLINICAL FOLLOW-UP RECOMMENDED.   Glucose, capillary  Status: Abnormal   Collection Time: 10/17/15  8:46 PM  Result Value Ref Range   Glucose-Capillary 154 (H) 65 - 99 mg/dL  Troponin I (q 6hr x 3)     Status: Abnormal   Collection Time: 10/18/15  3:10 AM  Result Value Ref Range   Troponin I 0.36 (H) <0.031 ng/mL    Comment:        PERSISTENTLY INCREASED TROPONIN VALUES IN THE RANGE OF 0.04-0.49 ng/mL CAN BE SEEN IN:       -UNSTABLE ANGINA       -CONGESTIVE HEART FAILURE       -MYOCARDITIS       -CHEST TRAUMA       -ARRYHTHMIAS       -LATE PRESENTING MYOCARDIAL INFARCTION       -COPD   CLINICAL FOLLOW-UP RECOMMENDED.   Heparin level (unfractionated)     Status: Abnormal   Collection Time: 10/18/15  3:10 AM  Result Value Ref Range   Heparin Unfractionated 0.74 (H) 0.30 - 0.70 IU/mL    Comment:        IF HEPARIN RESULTS ARE BELOW EXPECTED VALUES, AND PATIENT DOSAGE HAS BEEN CONFIRMED, SUGGEST FOLLOW UP TESTING OF ANTITHROMBIN III LEVELS.   Glucose, capillary     Status: Abnormal   Collection Time: 10/18/15  6:00 AM  Result Value Ref Range   Glucose-Capillary 149 (H) 65 - 99 mg/dL  Troponin I (q 6hr x 3)     Status: Abnormal   Collection Time: 10/18/15  9:50 AM  Result Value Ref Range   Troponin I 0.27 (H) <0.031 ng/mL    Comment:        PERSISTENTLY INCREASED TROPONIN VALUES IN THE RANGE OF 0.04-0.49 ng/mL CAN BE SEEN IN:       -UNSTABLE ANGINA       -CONGESTIVE HEART FAILURE       -MYOCARDITIS       -CHEST TRAUMA       -ARRYHTHMIAS       -LATE PRESENTING MYOCARDIAL INFARCTION       -COPD   CLINICAL FOLLOW-UP RECOMMENDED.   Heparin level (unfractionated)     Status: None   Collection Time: 10/18/15  9:50 AM  Result Value Ref Range   Heparin  Unfractionated 0.49 0.30 - 0.70 IU/mL    Comment:        IF HEPARIN RESULTS ARE BELOW EXPECTED VALUES, AND PATIENT DOSAGE HAS BEEN CONFIRMED, SUGGEST FOLLOW UP TESTING OF ANTITHROMBIN III LEVELS.   Glucose, capillary     Status: Abnormal   Collection Time: 10/18/15 11:23 AM  Result Value Ref Range   Glucose-Capillary 230 (H) 65 - 99 mg/dL  Glucose, capillary     Status: Abnormal   Collection Time: 10/18/15  4:46 PM  Result Value Ref Range   Glucose-Capillary 195 (H) 65 - 99 mg/dL   Comment 1 Notify RN   Glucose, capillary     Status: Abnormal   Collection Time: 10/18/15  9:36 PM  Result Value Ref Range   Glucose-Capillary 127 (H) 65 - 99 mg/dL  Heparin level (unfractionated)     Status: Abnormal   Collection Time: 10/19/15  3:28 AM  Result Value Ref Range   Heparin Unfractionated 0.27 (L) 0.30 - 0.70 IU/mL    Comment:        IF HEPARIN RESULTS ARE BELOW EXPECTED VALUES, AND PATIENT DOSAGE HAS BEEN CONFIRMED, SUGGEST FOLLOW UP TESTING OF ANTITHROMBIN III LEVELS.   Basic metabolic panel  Status: Abnormal   Collection Time: 10/19/15  3:28 AM  Result Value Ref Range   Sodium 136 135 - 145 mmol/L   Potassium 3.8 3.5 - 5.1 mmol/L   Chloride 98 (L) 101 - 111 mmol/L   CO2 25 22 - 32 mmol/L   Glucose, Bld 238 (H) 65 - 99 mg/dL   BUN 50 (H) 6 - 20 mg/dL   Creatinine, Ser 3.96 (H) 0.44 - 1.00 mg/dL   Calcium 8.8 (L) 8.9 - 10.3 mg/dL   GFR calc non Af Amer 10 (L) >60 mL/min   GFR calc Af Amer 11 (L) >60 mL/min    Comment: (NOTE) The eGFR has been calculated using the CKD EPI equation. This calculation has not been validated in all clinical situations. eGFR's persistently <60 mL/min signify possible Chronic Kidney Disease.    Anion gap 13 5 - 15  Glucose, capillary     Status: Abnormal   Collection Time: 10/19/15  5:47 AM  Result Value Ref Range   Glucose-Capillary 219 (H) 65 - 99 mg/dL  Glucose, capillary     Status: Abnormal   Collection Time: 10/19/15 10:26 AM   Result Value Ref Range   Glucose-Capillary 256 (H) 65 - 99 mg/dL    ROS: see hpi  Physical Exam: Filed Vitals:   10/19/15 0053 10/19/15 0515  BP: 135/40 141/46  Pulse: 66 64  Temp: 98.3 F (36.8 C) 98.2 F (36.8 C)  Resp: 18 18     General: Alert / pleasant/ elderly AAF/ OX3/ NAD HEENT: ,MMM, nonicteric Neck: no jvd Heart: RRR, no mur, rub ,or gallop Lungs: CTA bilat Abdomen:  Soft, BS pos.= normal nontender, nondistended Extremities: L BKA no edema. No pedal edema Skin: no overt RASH, warm dry Neuro: alert moves all extrem Dialysis Access: pos bruit LUA AVF  Dialysis Orders: Center: [facility]  on TTS . EDW 75kg HD Bath 2k, 2 ca  Time 4hrs Heparin 5000. Access LUA AVF/ R IJ P Cath     Hectorol 1 mcg IV/HD /     Venofer  100 mg loading  q hd from 5/02 -11/07/15  Other op Labs=  hgb 11.0 ca 9.5 phos 4.0  Pth 138  Assessment/Plan 1. Atypical Chest Pain = Cardiology and admit team WU/ 2. HO CAD= Cardiology RX  3. ESRD -  HD schedule TTS([facility]) hd today  4. Hypertension/volume  - no excess vol on exam or cxr / no uf today on hd / appears need to raise  edw/ Hydralazine and Norvasc on op listing  Fu bp/wts on hd /  5. Anemia  -  No esa  With hgb 11 /continue fe load as op  6. Metabolic bone disease -  No  Binder as op / iv hectorol on hd  Can dc po calcitriol on home med list 7. Nutrition - renal carb mod diet / renal diet 8. DM  Type 2 per admit  Ernest Haber, PA-C [place] [phone] 10/19/2015, 11:31 AM   Pt seen, examined and agree w A/P as above.  Kelly Splinter MD [employer] pager [phone]    cell [phone] 10/19/2015, 6:51 PM

## 2015-10-19 NOTE — Progress Notes (Signed)
ANTICOAGULATION CONSULT NOTE - Follow-up Consult  Pharmacy Consult for Heparin  Indication: chest pain/ACS  Allergies  Allergen Reactions  . Iohexol Itching and Rash     Code: RASH, Desc: [place] ON PT'S CHART ALLERGIC TO IV DYE 09/04/07/RM, Onset Date: [id number]     Patient Measurements: Height: 5\' 6"  (167.6 cm) Weight: 164 lb 14.4 oz (74.798 kg) (bedscale) IBW/kg (Calculated) : 59.3 Heparin Dosing Weight: 74.2 kg  Vital Signs: Temp: 98.3 F (36.8 C) (05/04 0053) Temp Source: Oral (05/04 0053) BP: 135/40 mmHg (05/04 0053) Pulse Rate: 66 (05/04 0053)  Labs:  Recent Labs  10/17/15 1458 10/17/15 1851 10/18/15 0310 10/18/15 0950 10/19/15 0328  HGB 12.3  --   --   --   --   HCT 37.0  --   --   --   --   PLT 277  --   --   --   --   HEPARINUNFRC  --   --  0.74* 0.49 0.27*  CREATININE 3.30*  --   --   --   --   TROPONINI  --  0.37* 0.36* 0.27*  --     Estimated Creatinine Clearance: 13.1 mL/min (by C-G formula based on Cr of 3.3).   Assessment: 82 year old female on heparin for r/o ACS. Heparin level down to subtherapeutic on 850 units/hr. No issues with line or bleeding reported per RN.  Goal of Therapy:  Heparin level 0.3-0.7 units/ml Monitor platelets by anticoagulation protocol: Yes   Plan:  Increase heparin to 850 units/hr F/u 8 hr heparin level  Sherlon Handing, PharmD, BCPS Clinical pharmacist, pager [phone]  10/19/2015,5:32 AM

## 2015-10-19 NOTE — Progress Notes (Signed)
       Patient Name: [name] Date of Encounter: 10/19/2015    SUBJECTIVE: The patient is doing well this morning and wondering what our intentions are. She's had no significant recurrence of chest discomfort. She is eating breakfast and denies dyspnea.  TELEMETRY:  Normal sinus rhythm. Filed Vitals:   10/18/15 2138 10/18/15 2222 10/19/15 0053 10/19/15 0515  BP: 132/41 131/50 135/40 141/46  Pulse: 64  66 64  Temp: 98.4 F (36.9 C)  98.3 F (36.8 C) 98.2 F (36.8 C)  TempSrc: Oral  Oral Oral  Resp: 18  18 18   Height:      Weight:    168 lb 8 oz (76.431 kg)  SpO2: 96%  95% 96%    Intake/Output Summary (Last 24 hours) at 10/19/15 1105 Last data filed at 10/19/15 0900  Gross per 24 hour  Intake 883.88 ml  Output      0 ml  Net 883.88 ml   LABS: Basic Metabolic Panel:  Recent Labs  10/17/15 1458 10/19/15 0328  NA 138 136  K 3.4* 3.8  CL 99* 98*  CO2 28 25  GLUCOSE 174* 238*  BUN 31* 50*  CREATININE 3.30* 3.96*  CALCIUM 9.2 8.8*   CBC:  Recent Labs  10/17/15 1458  WBC 9.7  HGB 12.3  HCT 37.0  MCV 91.4  PLT 277   Cardiac Enzymes:  Recent Labs  10/17/15 1851 10/18/15 0310 10/18/15 0950  TROPONINI 0.37* 0.36* 0.27*   BNP: Invalid input(s): POCBNP Hemoglobin A1C:  Recent Labs  10/17/15 1636  HGBA1C 6.1*   Fasting Lipid Panel: No results for input(s): CHOL, HDL, LDLCALC, TRIG, CHOLHDL, LDLDIRECT in the last 72 hours.  Radiology/Studies:  No new data  Physical Exam: Blood pressure 141/46, pulse 64, temperature 98.2 F (36.8 C), temperature source Oral, resp. rate 18, height 5\' 6"  (1.676 m), weight 168 lb 8 oz (76.431 kg), SpO2 96 %. Weight change: 4 lb 3.2 oz (1.905 kg)  Wt Readings from Last 3 Encounters:  10/19/15 168 lb 8 oz (76.431 kg)  07/28/15 170 lb (77.111 kg)  07/26/15 180 lb (81.647 kg)    No paracardial friction rub Still mild right parasternal tenderness No peripheral edema  ASSESSMENT:  1. Atypical chest  discomfort felt to be most likely related to musculoskeletal discomfort. 2. Elevated troponin in setting of chronic kidney failure. Suspect that this is not an ACS. 3. End-stage kidney disease.  Plan:  Echo is pending. Assuming no significant pericardial effusion or regional wall motion abnormality, no further cardiac evaluation will be recommended.  Nephrology needs to be contacted so that the patient can stay on her dialysis schedule.  Demetrios Isaacs 10/19/2015, 11:05 AM

## 2015-10-19 NOTE — Discharge Summary (Signed)
Discharge Summary  [name] [id number] DOB: 03-11-1930  PCP: Renato Shin, MD  Admit date: 10/17/2015 Discharge date: 10/19/2015   Recommendations for Outpatient Follow-up:  1. PCP in 2 weeks. 2. Dr. Loanne Drilling Endocrine 3-4 weeks 3. Dr. Meda Coffee Cardiology 1-2 weeks   Discharge Diagnoses:  Active Hospital Problems   Diagnosis Date Noted  . Chest pain syndrome 10/17/2015  . COPD (chronic obstructive pulmonary disease) ([place]) 10/17/2015  . Prolonged Q-T interval on ECG 10/17/2015  . Controlled insulin dependent diabetes mellitus ([place]) 09/18/2015  . Chronic diastolic congestive heart failure, NYHA class 3 ([place])   . Elevated troponin 03/06/2015  . Gait instability: Status post left BKA 09/10/2012  . Obstructive sleep apnea 10/12/2008  . CKD (chronic kidney disease) stage V requiring chronic dialysis ([place]) 07/19/2008  . Hyperlipidemia 07/16/2007  . Coronary artery disease due to lipid rich plaque; 70% ostial-proximal AV Groove Cx - not good PCI target 01/22/2007  . Essential hypertension 01/22/2007  . PERIPHERAL VASCULAR DISEASE 01/22/2007    Resolved Hospital Problems   Diagnosis Date Noted Date Resolved  . NSTEMI (non-ST elevated myocardial infarction) [facility]) 06/17/2014 10/17/2015    Discharge Condition: Stable   Diet recommendation: Renal, Diabetic   Filed Vitals:   10/19/15 1305 10/19/15 1310  BP: 162/61 143/64  Pulse: 64 66  Temp: 98 F (36.7 C)   Resp: 18     History of present illness:  82 y.o. female with medical history significant for coronary artery disease with stable disease not amenable to intervention therefore focus has been on medical management, chronic kidney disease now on dialysis, sleep apnea and COPD, hypertension, chronic diastolic heart failure managed now with dialysis, insulin-dependent diabetes with recent documentation of overcorrection with adjustments in insulin by outpatient endocrinology, peripheral vascular disease status post  amputation, dyslipidemia who presented to the ER today after developing chest pain during hemodialysis.    Hospital Course:  Principal Problem:   Chest pain syndrome Active Problems:   Hyperlipidemia   Obstructive sleep apnea   Essential hypertension   Coronary artery disease due to lipid rich plaque; 70% ostial-proximal AV Groove Cx - not good PCI target   PERIPHERAL VASCULAR DISEASE   CKD (chronic kidney disease) stage V requiring chronic dialysis (HCC)   Gait instability: Status post left BKA   Elevated troponin   Chronic diastolic congestive heart failure, NYHA class 3 (HCC)   Controlled insulin dependent diabetes mellitus (HCC)   COPD (chronic obstructive pulmonary disease) (HCC)   Prolonged Q-T interval on ECG  Pt was seen in consultation by Cardiology who recommended placement on heparin drip. She had her cardiac enzymes cycled, they were elevated but remained flat. Her chest pain is reproducible and positional, so possibility of actual ACS with angina is felt to be low. She had echo 5/4 that showed no obvious wall motion abnormality or effusion, and per cardiology recommendation she is being discharged home today after her normally scheduled dialysis.  Procedures:  Echo 5/4  Study Conclusions - Left ventricle: The cavity size was normal. Wall thickness was  increased in a pattern of moderate LVH. Systolic function was  normal. The estimated ejection fraction was in the range of 60%  to 65%. Features are consistent with a pseudonormal left  ventricular filling pattern, with concomitant abnormal relaxation  and increased filling pressure (grade 2 diastolic dysfunction).  Doppler parameters are consistent with high ventricular filling  pressure. - Mitral valve: Severely calcified annulus. Mildly thickened  leaflets . - Left atrium: The atrium  was mildly dilated. - Pulmonary arteries: PA peak pressure: 46 mm Hg (S).   Consultations:  Cardiology  Nephrology for  routine HD   Discharge Exam: BP 143/64 mmHg  Pulse 66  Temp(Src) 98 F (36.7 C) (Oral)  Resp 18  Ht 5\' 6"  (1.676 m)  Wt 76.3 kg (168 lb 3.4 oz)  BMI 27.16 kg/m2  SpO2 98% General:  Alert, oriented, calm, in no acute distress  Eyes: pupils round and reactive to light and accomodation, clear sclerea Neck: supple, no masses, trachea mildline  Cardiovascular: RRR, no murmurs or rubs, no peripheral edema. Left chest is tender to palpation. Respiratory: clear to auscultation bilaterally, no wheezes, no crackles  Abdomen: soft, nontender, nondistended, normal bowel tones heard  Skin: dry, no rashes  Musculoskeletal: no joint effusions, normal range of motion  Psychiatric: appropriate affect, normal speech  Neurologic: extraocular muscles intact, clear speech, moving all extremities with intact sensorium    Discharge Instructions You were cared for by a hospitalist during your hospital stay. If you have any questions about your discharge medications or the care you received while you were in the hospital after you are discharged, you can call the unit and asked to speak with the hospitalist on call if the hospitalist that took care of you is not available. Once you are discharged, your primary care physician will handle any further medical issues. Please note that NO REFILLS for any discharge medications will be authorized once you are discharged, as it is imperative that you return to your primary care physician (or establish a relationship with a primary care physician if you do not have one) for your aftercare needs so that they can reassess your need for medications and monitor your lab values.     Medication List    TAKE these medications        acetaminophen 500 MG tablet  Commonly known as:  TYLENOL  Take 500 mg by mouth every 6 (six) hours as needed for mild pain.     albuterol 108 (90 Base) MCG/ACT inhaler  Commonly known as:  PROVENTIL HFA;VENTOLIN HFA  Inhale 2 puffs into  the lungs every 4 (four) hours as needed for wheezing or shortness of breath.     allopurinol 100 MG tablet  Commonly known as:  ZYLOPRIM  Take 1 tablet (100 mg total) by mouth daily.     amLODipine 10 MG tablet  Commonly known as:  NORVASC  Take 1 tablet (10 mg total) by mouth daily.     aspirin EC 81 MG tablet  Take 1 tablet (81 mg total) by mouth daily.     atorvastatin 40 MG tablet  Commonly known as:  LIPITOR  TAKE 1 TABLET BY MOUTH DAILY AT 6 PM     calcitRIOL 0.25 MCG capsule  Commonly known as:  ROCALTROL  Take 1 capsule (0.25 mcg total) by mouth daily.     clopidogrel 75 MG tablet  Commonly known as:  PLAVIX  TAKE 1 TABLET BY MOUTH DAILY     donepezil 5 MG tablet  Commonly known as:  ARICEPT  Take 1 tablet (5 mg total) by mouth at bedtime.     ezetimibe 10 MG tablet  Commonly known as:  ZETIA  TAKE 1 TABLET BY MOUTH EVERY MORNING     feeding supplement (GLUCERNA SHAKE) Liqd  Take 237 mLs by mouth every morning.     Fluticasone-Salmeterol 100-50 MCG/DOSE Aepb  Commonly known as:  ADVAIR DISKUS  Inhale 1 puff  into the lungs 2 (two) times daily.     gabapentin 100 MG capsule  Commonly known as:  NEURONTIN  TAKE 1 CAPSULE BY MOUTH EVERY NIGHT AT BEDTIME     hydrALAZINE 25 MG tablet  Commonly known as:  APRESOLINE  Take 1 tablet (25 mg total) by mouth 3 (three) times daily.     insulin NPH-regular Human (70-30) 100 UNIT/ML injection  Commonly known as:  NOVOLIN 70/30  Inject 8 Units into the skin daily with breakfast.     isosorbide mononitrate 60 MG 24 hr tablet  Commonly known as:  IMDUR  TAKE 1 TABLET BY MOUTH DAILY     latanoprost 0.005 % ophthalmic solution  Commonly known as:  XALATAN  Place 1 drop into both eyes at bedtime.     nitroGLYCERIN 0.4 MG SL tablet  Commonly known as:  NITROSTAT  Place 0.4 mg under the tongue every 5 (five) minutes as needed for chest pain.     ONE TOUCH ULTRA TEST test strip  Generic drug:  glucose blood  USE TO  TEST BLOOD SUGAR TWICE DAILY     pantoprazole 40 MG tablet  Commonly known as:  PROTONIX  Take 1 tablet (40 mg total) by mouth 2 (two) times daily.     RANEXA 500 MG 12 hr tablet  Generic drug:  ranolazine  TAKE 1 TABLET BY MOUTH EVERY 12 HOURS     sertraline 100 MG tablet  Commonly known as:  ZOLOFT  Take 1 tablet (100 mg total) by mouth daily.       Allergies  Allergen Reactions  . Iohexol Itching and Rash     Code: RASH, Desc: [place] ON PT'S CHART ALLERGIC TO IV DYE 09/04/07/RM, Onset Date: [id number]       The results of significant diagnostics from this hospitalization (including imaging, microbiology, ancillary and laboratory) are listed below for reference.    Significant Diagnostic Studies: Dg Chest 2 View  10/17/2015  CLINICAL DATA:  Chest pain for 1 day EXAM: CHEST  2 VIEW COMPARISON:  05/03/2015 FINDINGS: There is no focal parenchymal opacity. There is no pleural effusion or pneumothorax. There is stable cardiomegaly. There is a dual-lumen right-sided central venous catheter. The osseous structures are unremarkable. IMPRESSION: No active cardiopulmonary disease. Electronically Signed   By: Kathreen Devoid   On: 10/17/2015 14:44    Microbiology: Recent Results (from the past 240 hour(s))  MRSA PCR Screening     Status: None   Collection Time: 10/17/15  6:19 PM  Result Value Ref Range Status   MRSA by PCR NEGATIVE NEGATIVE Final    Comment:        The GeneXpert MRSA Assay (FDA approved for NASAL specimens only), is one component of a comprehensive MRSA colonization surveillance program. It is not intended to diagnose MRSA infection nor to guide or monitor treatment for MRSA infections.   Culture, blood (Routine X 2) w Reflex to ID Panel     Status: None (Preliminary result)   Collection Time: 10/17/15  6:48 PM  Result Value Ref Range Status   Specimen Description BLOOD RIGHT FOREARM  Final   Special Requests IN PEDIATRIC BOTTLE 4CC  Final   Culture NO  GROWTH < 24 HOURS  Final   Report Status PENDING  Incomplete  Culture, blood (Routine X 2) w Reflex to ID Panel     Status: None (Preliminary result)   Collection Time: 10/17/15  6:50 PM  Result Value Ref Range Status  Specimen Description BLOOD RIGHT HAND  Final   Special Requests BOTTLES DRAWN AEROBIC AND ANAEROBIC 10CC   Final   Culture NO GROWTH < 24 HOURS  Final   Report Status PENDING  Incomplete     Labs: Basic Metabolic Panel:  Recent Labs Lab 10/17/15 1458 10/19/15 0328  NA 138 136  K 3.4* 3.8  CL 99* 98*  CO2 28 25  GLUCOSE 174* 238*  BUN 31* 50*  CREATININE 3.30* 3.96*  CALCIUM 9.2 8.8*   Liver Function Tests: No results for input(s): AST, ALT, ALKPHOS, BILITOT, PROT, ALBUMIN in the last 168 hours. No results for input(s): LIPASE, AMYLASE in the last 168 hours. No results for input(s): AMMONIA in the last 168 hours. CBC:  Recent Labs Lab 10/17/15 1458 10/19/15 1300  WBC 9.7 7.8  HGB 12.3 11.0*  HCT 37.0 32.7*  MCV 91.4 94.5  PLT 277 255   Cardiac Enzymes:  Recent Labs Lab 10/17/15 1851 10/18/15 0310 10/18/15 0950  TROPONINI 0.37* 0.36* 0.27*   BNP: BNP (last 3 results)  Recent Labs  04/24/15 1738  BNP 1594.7*    ProBNP (last 3 results) No results for input(s): PROBNP in the last 8760 hours.  CBG:  Recent Labs Lab 10/18/15 1646 10/18/15 2136 10/19/15 0547 10/19/15 1026 10/19/15 1122  GLUCAP 195* 127* 219* 256* 248*    Time spent: 45 minutes were spent in preparing this discharge including medication reconciliation, counseling, and coordination of care.  Signed:  [employer]  [facility] 10/19/2015, 1:22 PM

## 2015-10-20 ENCOUNTER — Encounter: Payer: Self-pay | Admitting: Vascular Surgery

## 2015-10-20 LAB — HEPATITIS B SURFACE ANTIGEN: HEP B S AG: NEGATIVE

## 2015-10-22 LAB — CULTURE, BLOOD (ROUTINE X 2)
CULTURE: NO GROWTH
Culture: NO GROWTH

## 2015-10-23 ENCOUNTER — Ambulatory Visit (INDEPENDENT_AMBULATORY_CARE_PROVIDER_SITE_OTHER): Payer: Medicare Other | Admitting: Cardiology

## 2015-10-23 VITALS — BP 122/44 | HR 66 | Ht 66.0 in | Wt 179.0 lb

## 2015-10-23 DIAGNOSIS — I2510 Atherosclerotic heart disease of native coronary artery without angina pectoris: Secondary | ICD-10-CM | POA: Insufficient documentation

## 2015-10-23 DIAGNOSIS — E785 Hyperlipidemia, unspecified: Secondary | ICD-10-CM | POA: Diagnosis not present

## 2015-10-23 DIAGNOSIS — I110 Hypertensive heart disease with heart failure: Secondary | ICD-10-CM

## 2015-10-23 DIAGNOSIS — N183 Chronic kidney disease, stage 3 unspecified: Secondary | ICD-10-CM

## 2015-10-23 DIAGNOSIS — R778 Other specified abnormalities of plasma proteins: Secondary | ICD-10-CM

## 2015-10-23 DIAGNOSIS — I214 Non-ST elevation (NSTEMI) myocardial infarction: Secondary | ICD-10-CM | POA: Diagnosis not present

## 2015-10-23 DIAGNOSIS — R7989 Other specified abnormal findings of blood chemistry: Secondary | ICD-10-CM

## 2015-10-23 DIAGNOSIS — I10 Essential (primary) hypertension: Secondary | ICD-10-CM

## 2015-10-23 NOTE — Patient Instructions (Signed)
Medication Instructions:   Your physician recommends that you continue on your current medications as directed. Please refer to the Current Medication list given to you today.     Follow-Up:  3 MONTHS WITH DR NELSON       If you need a refill on your cardiac medications before your next appointment, please call your pharmacy.   

## 2015-10-23 NOTE — Progress Notes (Signed)
Patient ID: [name], female   DOB: 09-07-1929, 82 y.o.   MRN: [id number]      Cardiology Office Note Date:  10/23/2015  Patient ID:  Christina, [name] [birth year], MRN [id number] PCP:  Renato Shin, MD  Cardiologist:  Dr. Meda Coffee   Chief Complaint: TOC visit posthospitalization 4 days.  History of Present Illness:  [name] is a 82 y.o. female with history of chronic combined CHF (EF 45-50% by [facility] 02/2015), CAD (NSTEMI 06/2014 with LHC showing moderate disease treated medically, recent cath 02/2015 with stable anatomy), DM, HTN, HLD, OSA (on CPAP), PVD s/p L BKA 08/2012, CKD stage III, COPD, bradycardia (not on BB due to this) who presents for post-hospital follow-up. Last echo 06/2014: EF 50-55%, no rWMA, grade 3 DD, mildly dilated LA.  She was admitted 9/19-9/22/16 with chest pain, SOB, left arm pain and right leg pain. She was admitted for [place] and had mild troponin elevation to 0.19. Cath 03/07/15 showed stable coronary angiography and elevated LVEDP - elevated troponin was felt more likely supply/demand in setting of CHF. Bidil was added in place of hydralazine, along with Ranexa. She also received IV Lasix post-cath. She had AKI on CKD with elevation of Cr to 1.83 and was kept in the hospital for further monitoring. At discharge her Cr was 1.94.   She comes in to clinic today doing well. She is accompanied by her husband. She was hospitalized in Npovember with acute on chronic CKD and started on HD. She is currently having it done via perm cath, s/p fistula surgery, not mature yet. Her weight 183 lbs, she has PND about 1x/week, some LE edema, no DOE or CP. No bleeding, no palpitations or syncope.  10/23/2015 - the patient was hospitalized on May 1 with acute chest pain after hemodialysis. She has mild troponin elevation with flat friend that was attributed to her CK D stage IV. She now feels significantly better, no fluids are managed to hemodialysis. She has maturing  fistula, in the meantime she is using dialysis catheter on her chest. She denies any chest pain or shortness of breath nor orthopnea or dyspnea, no edema in her right lower extremity, left his updated above the knee.  Past Medical History  Diagnosis Date  . Uterine cancer ([place])   . Diabetes mellitus     type II; peripheral neuropathy  . Hypertension   . GERD (gastroesophageal reflux disease)   . Peripheral vascular disease ([place])     a. s/p L BKA 08/2012.  . Obesity   . Dyslipidemia   . Chronic combined systolic and diastolic CHF (congestive heart failure) (HCC)     a. EF 50-55%, mild LVH and grade 1 diast. Dysfxn b. Grade 3 Diastolic Dysfunction [id number]. b. 02/2015: EF 45-50% by cath.  . Osteoarthritis cervical spine   . DM retinopathy ([place])   . Sleep apnea     with CPAP  . History of shingles   . COPD (chronic obstructive pulmonary disease) ([place])   . Depression   . Peptic ulcer disease     duodenal  . Peptic stricture of esophagus   . Hiatal hernia   . Diverticulosis   . Arthritis   . CAD (coronary artery disease)     a. s/p multiple caths with nonobs CAD;   b. cath 1/10: pLAD 20%, mLAD 40%, pCFX 20%, mCFX 40%, pRCA 60-70%;   c.  Myoview 06/02/12: Low anterior wall scar, no ischemia, EF 37%. d. cath 06/20/2014 70%  mid LCx, medical therapy, high risk for PCI due to close proximity to large OM e. cath 03/07/15 pro RCA 50% & pro to mid Cx 75%, both stable, LVEDP 33-70mm Hg-> medical management  . Cardiomyopathy ([place])     a. Echo 05/31/12: EF 40-45%. b. Echo 06/2014: EF 50-55%. c. Cath 02/2015: EF 45-50%.  . Asthma     Mild  . Pruritic condition     Idiopathic  . Zoster   . Noncompliance   . CKD (chronic kidney disease), stage III     Dr. Lorrene Reid  . Bradycardia     a. Not on BB due to this.  . Shortness of breath dyspnea     Past Surgical History  Procedure Laterality Date  . Tubal ligation  1967  . Knee arthroscopy  10/1998    Left  . Craniotomy  1997    Left for SDH  .  Cataract extraction, bilateral  2005  . Hernia repair    . Esophagogastroduodenoscopy  04/04/2004  . Spine surgery      C-spine and lumbar surgery  . Cholecystectomy  2010  . Cardiac catheterization    . Dexa  7/05  . Amputation Left 09/04/2012    Procedure: AMPUTATION BELOW KNEE;  Surgeon: Angelia Mould, MD;  Location: [facility];  Service: Vascular;  Laterality: Left;  . Abdominal angiogram N/A 08/31/2012    Procedure: ABDOMINAL ANGIOGRAM with runoff poss intervention;  Surgeon: Angelia Mould, MD;  Location: [facility] CATH LAB;  Service: Cardiovascular;  Laterality: N/A;  . Tubal ligation    . Left heart catheterization with coronary angiogram N/A 06/22/2014    Procedure: LEFT HEART CATHETERIZATION WITH CORONARY ANGIOGRAM;  Surgeon: Burnell Blanks, MD;  [facility]: [facility] CATH LAB;  Service: Cardiovascular;  Laterality: N/A;  . Eye surgery    . Brain surgery      1997 blood clot on the brain, then had to relieve fluid on the brain  . Multiple extractions with alveoloplasty N/A 08/01/2014    Procedure: EXTRACTIONS OF TEETH NUMBERS 7 8 9 10 11  AND 19 AND ALVEOLOPLASTY UPPER LEFT AND RIGHT QUADRANT;  Surgeon: Isac Caddy, DDS;  Location: [facility];  Service: Oral Surgery;  Laterality: N/A;  . Cardiac catheterization N/A 03/07/2015    Procedure: Left Heart Cath and Coronary Angiography;  Surgeon: Leonie Man, MD;  Location: [facility] CV LAB;  Service: Cardiovascular;  Laterality: N/A;  . Av fistula placement Left 05/10/2015    Procedure: CREATION OF LEFT UPPER ARM ARTERIOVENOUS (AV) FISTULA ;  Surgeon: Elam Dutch, MD;  [facility] OR;  Service: Vascular;  Laterality: Left;    Current Outpatient Prescriptions  Medication Sig Dispense Refill  . acetaminophen (TYLENOL) 500 MG tablet Take 500 mg by mouth every 6 (six) hours as needed for mild pain.    Marland Kitchen albuterol (PROVENTIL HFA;VENTOLIN HFA) 108 (90 BASE) MCG/ACT inhaler Inhale 2 puffs into the lungs every 4 (four) hours as  needed for wheezing or shortness of breath. 1 Inhaler 3  . allopurinol (ZYLOPRIM) 100 MG tablet Take 1 tablet (100 mg total) by mouth daily. 90 tablet 3  . amLODipine (NORVASC) 10 MG tablet Take 1 tablet (10 mg total) by mouth daily. 90 tablet 3  . aspirin EC 81 MG tablet Take 1 tablet (81 mg total) by mouth daily. 90 tablet 3  . atorvastatin (LIPITOR) 40 MG tablet TAKE 1 TABLET BY MOUTH DAILY AT 6 PM 90 tablet 3  . calcitRIOL (ROCALTROL) 0.25  MCG capsule Take 1 capsule (0.25 mcg total) by mouth daily. 90 capsule 3  . clopidogrel (PLAVIX) 75 MG tablet TAKE 1 TABLET BY MOUTH DAILY 30 tablet 0  . donepezil (ARICEPT) 5 MG tablet Take 1 tablet (5 mg total) by mouth at bedtime. 90 tablet 3  . ezetimibe (ZETIA) 10 MG tablet TAKE 1 TABLET BY MOUTH EVERY MORNING 30 tablet 0  . feeding supplement (GLUCERNA SHAKE) LIQD Take 237 mLs by mouth every morning.     . Fluticasone-Salmeterol (ADVAIR DISKUS) 100-50 MCG/DOSE AEPB Inhale 1 puff into the lungs 2 (two) times daily. 1 each 11  . gabapentin (NEURONTIN) 100 MG capsule TAKE 1 CAPSULE BY MOUTH EVERY NIGHT AT BEDTIME 30 capsule 3  . hydrALAZINE (APRESOLINE) 25 MG tablet Take 1 tablet (25 mg total) by mouth 3 (three) times daily. 270 tablet 3  . insulin NPH-regular Human (NOVOLIN 70/30) (70-30) 100 UNIT/ML injection Inject 8 Units into the skin daily with breakfast. 10 mL 11  . isosorbide mononitrate (IMDUR) 60 MG 24 hr tablet TAKE 1 TABLET BY MOUTH DAILY 90 tablet 2  . latanoprost (XALATAN) 0.005 % ophthalmic solution Place 1 drop into both eyes at bedtime.    . nitroGLYCERIN (NITROSTAT) 0.4 MG SL tablet Place 0.4 mg under the tongue every 5 (five) minutes as needed for chest pain.     . ONE TOUCH ULTRA TEST test strip USE TO TEST BLOOD SUGAR TWICE DAILY 100 each 2  . pantoprazole (PROTONIX) 40 MG tablet Take 1 tablet (40 mg total) by mouth 2 (two) times daily. 180 tablet 2  . RANEXA 500 MG 12 hr tablet TAKE 1 TABLET BY MOUTH EVERY 12 HOURS 60 tablet 1  .  sertraline (ZOLOFT) 100 MG tablet Take 1 tablet (100 mg total) by mouth daily. 90 tablet 2   No current facility-administered medications for this visit.   Allergies:   Iohexol   Social History:  The patient  reports that she has never smoked. She has never used smokeless tobacco. She reports that she does not drink alcohol or use illicit drugs.   Family History:  The patient's family history includes Asthma in her other; Cancer - Other in her mother; Diabetes in her mother and sister; Heart disease in her mother; Hypertension in her father and mother; Kidney disease in her paternal grandmother; Lymphoma in her father; Rheum arthritis in her father and mother; Stomach cancer in her mother; Stroke in her paternal grandmother. There is no history of Heart attack.*  ROS:  Please see the history of present illness.    All other systems are reviewed and otherwise negative.   PHYSICAL EXAM:  VS:  BP 122/44 mmHg  Pulse 66  Ht 5\' 6"  (1.676 m)  Wt 179 lb (81.194 kg)  BMI 28.91 kg/m2 BMI: Body mass index is 28.91 kg/(m^2). Well nourished obese AAF, in no acute distress HEENT: normocephalic, atraumatic Neck: no JVD, carotid bruits or masses Cardiac:  normal S1, S2; RRR; no murmurs, rubs, or gallops Lungs:  clear to auscultation bilaterally, no wheezing, rhonchi or rales Abd: soft, nontender, no hepatomegaly, + BS MS: no deformity or atrophy Ext: no edema on the right, prosthesis on the left, right radial cath site with resolving ecchymosis, no hematoma, good pulse Skin: warm and dry, no rash Neuro:  moves all extremities spontaneously, no focal abnormalities noted, follows commands Psych: euthymic mood, full affect  EKG:  Done today shows NSR 71bpm, lateral infarct age undetermined, nonspecific ST-T changes, QTc 43ms, no  acute change from prior.  Recent Labs: 04/24/2015: B Natriuretic Peptide 1594.7* 04/25/2015: Magnesium 1.9 08/17/2015: ALT 19 10/19/2015: BUN 51*; Creatinine, Ser 3.90*;  Hemoglobin 11.0*; Platelets 255; Potassium 4.2; Sodium 136  No results found for requested labs within last 365 days.   Estimated Creatinine Clearance: 11.5 mL/min (by C-G formula based on Cr of 3.9).   Wt Readings from Last 3 Encounters:  10/23/15 179 lb (81.194 kg)  10/19/15 167 lb 15.9 oz (76.2 kg)  07/28/15 170 lb (77.111 kg)    Other studies reviewed: Additional studies/records reviewed today include: summarized above    ASSESSMENT AND PLAN:  1. Elevated troponin - attributed to CKD stage IV 2. Acute on chronic combined CHF - now euvolemic at her baseline weight, 180 lbs, fluid management hemodialysis. CKD stage V - ESRD on HD. 3. CAD as above - QTC stable on Ranexa, continue Plavix, atorvastatin, Imdur. 4. Essential HTN - controlled.No hypotension during HD. 5. H/o sinus bradycardia - stable. Not on BB due to this.  Follow up in 3 months.  Corliss Blacker  10/23/2015 10:26 AM     [place] [address] [place] [address] [phone] (office)  [phone] (fax)

## 2015-10-25 ENCOUNTER — Ambulatory Visit (HOSPITAL_COMMUNITY)
Admission: RE | Admit: 2015-10-25 | Discharge: 2015-10-25 | Disposition: A | Discharge status: Home / Self Care | Payer: Medicare Other | Source: Ambulatory Visit | Attending: Vascular Surgery | Admitting: Vascular Surgery

## 2015-10-25 ENCOUNTER — Ambulatory Visit (INDEPENDENT_AMBULATORY_CARE_PROVIDER_SITE_OTHER): Payer: Medicare Other | Admitting: Vascular Surgery

## 2015-10-25 ENCOUNTER — Other Ambulatory Visit: Payer: Self-pay | Admitting: Endocrinology

## 2015-10-25 ENCOUNTER — Encounter: Payer: Self-pay | Admitting: Vascular Surgery

## 2015-10-25 ENCOUNTER — Other Ambulatory Visit: Payer: Self-pay | Admitting: Vascular Surgery

## 2015-10-25 VITALS — BP 116/56 | HR 68 | Temp 98.9°F | Resp 18 | Ht 66.0 in | Wt 179.0 lb

## 2015-10-25 DIAGNOSIS — N183 Chronic kidney disease, stage 3 (moderate): Secondary | ICD-10-CM | POA: Insufficient documentation

## 2015-10-25 DIAGNOSIS — E785 Hyperlipidemia, unspecified: Secondary | ICD-10-CM | POA: Insufficient documentation

## 2015-10-25 DIAGNOSIS — I2510 Atherosclerotic heart disease of native coronary artery without angina pectoris: Secondary | ICD-10-CM | POA: Insufficient documentation

## 2015-10-25 DIAGNOSIS — I739 Peripheral vascular disease, unspecified: Secondary | ICD-10-CM

## 2015-10-25 DIAGNOSIS — E11319 Type 2 diabetes mellitus with unspecified diabetic retinopathy without macular edema: Secondary | ICD-10-CM | POA: Insufficient documentation

## 2015-10-25 DIAGNOSIS — E1122 Type 2 diabetes mellitus with diabetic chronic kidney disease: Secondary | ICD-10-CM | POA: Insufficient documentation

## 2015-10-25 DIAGNOSIS — K219 Gastro-esophageal reflux disease without esophagitis: Secondary | ICD-10-CM | POA: Diagnosis not present

## 2015-10-25 DIAGNOSIS — I130 Hypertensive heart and chronic kidney disease with heart failure and stage 1 through stage 4 chronic kidney disease, or unspecified chronic kidney disease: Secondary | ICD-10-CM | POA: Diagnosis not present

## 2015-10-25 DIAGNOSIS — R938 Abnormal findings on diagnostic imaging of other specified body structures: Secondary | ICD-10-CM | POA: Insufficient documentation

## 2015-10-25 DIAGNOSIS — I5042 Chronic combined systolic (congestive) and diastolic (congestive) heart failure: Secondary | ICD-10-CM | POA: Insufficient documentation

## 2015-10-25 DIAGNOSIS — Z48812 Encounter for surgical aftercare following surgery on the circulatory system: Secondary | ICD-10-CM

## 2015-10-25 NOTE — Progress Notes (Signed)
HISTORY AND PHYSICAL     CC:  Follow up for PAD Referring Provider:  Renato Shin, MD  HPI: This is a 82 y.o. female presents today for f/u ABI's of her right foot.  She states that she has not had any non healing wounds on her right foot.  She does keep her right heel propped off the bed.  She states that she does have some blood pressure swings that make her dizzy and is limited by that, but her legs do not limit her activity.    She was in the hospital a couple of weeks ago with chest pain, SOB, left arm pain and right leg pain.  Her troponin was mildly elevated and cardiac cath on 03/07/15 revealed stable coronary anatomy with elevated LVEDP.  It was felt that her elevated troponin was due to supply/demand in setting of CHF.  Her medications were adjusted.    She does have a hx of left BKA in the past and is walking with a prosthesis.  She states that she has lost quite a bit of weight and she is going back to [facility] to have her prosthesis re-fitted.    She is ESRD and had a left brachiocephalic AVF placed by Dr. Oneida Alar on 05/10/15.  She states that they are working on using it.  It worked for 1.5 hrs yesterday before they had to stop using the fistula.  She does have a right IJ tunneled catheter in place that was placed in IR on 05/15/15.   She does have diabetes and is on insulin.  She is on a statin for cholesterol management.  He takes a daily aspirin.  Her blood pressure medications are being adjusted accordingly by cardiology.  Past Medical History  Diagnosis Date  . Uterine cancer ([place])   . Diabetes mellitus     type II; peripheral neuropathy  . Hypertension   . GERD (gastroesophageal reflux disease)   . Peripheral vascular disease ([place])     a. s/p L BKA 08/2012.  . Obesity   . Dyslipidemia   . Chronic combined systolic and diastolic CHF (congestive heart failure) (HCC)     a. EF 50-55%, mild LVH and grade 1 diast. Dysfxn b. Grade 3 Diastolic Dysfunction [id number]. b. 02/2015:  EF 45-50% by cath.  . Osteoarthritis cervical spine   . DM retinopathy ([place])   . Sleep apnea     with CPAP  . History of shingles   . COPD (chronic obstructive pulmonary disease) ([place])   . Depression   . Peptic ulcer disease     duodenal  . Peptic stricture of esophagus   . Hiatal hernia   . Diverticulosis   . Arthritis   . CAD (coronary artery disease)     a. s/p multiple caths with nonobs CAD;   b. cath 1/10: pLAD 20%, mLAD 40%, pCFX 20%, mCFX 40%, pRCA 60-70%;   c.  Myoview 06/02/12: Low anterior wall scar, no ischemia, EF 37%. d. cath 06/20/2014 70% mid LCx, medical therapy, high risk for PCI due to close proximity to large OM e. cath 03/07/15 pro RCA 50% & pro to mid Cx 75%, both stable, LVEDP 33-71mm Hg-> medical management  . Cardiomyopathy ([place])     a. Echo 05/31/12: EF 40-45%. b. Echo 06/2014: EF 50-55%. c. Cath 02/2015: EF 45-50%.  . Asthma     Mild  . Pruritic condition     Idiopathic  . Zoster   . Noncompliance   . Bradycardia  a. Not on BB due to this.  . Shortness of breath dyspnea   . CKD (chronic kidney disease), stage III     Dr. Lorrene Reid,    dialyzes Tuesday, Thursday, Saturday    Past Surgical History  Procedure Laterality Date  . Tubal ligation  1967  . Knee arthroscopy  10/1998    Left  . Craniotomy  1997    Left for SDH  . Cataract extraction, bilateral  2005  . Hernia repair    . Esophagogastroduodenoscopy  04/04/2004  . Spine surgery      C-spine and lumbar surgery  . Cholecystectomy  2010  . Cardiac catheterization    . Dexa  7/05  . Amputation Left 09/04/2012    Procedure: AMPUTATION BELOW KNEE;  Surgeon: Angelia Mould, MD;  Location: [facility];  Service: Vascular;  Laterality: Left;  . Abdominal angiogram N/A 08/31/2012    Procedure: ABDOMINAL ANGIOGRAM with runoff poss intervention;  Surgeon: Angelia Mould, MD;  Location: [facility] CATH LAB;  Service: Cardiovascular;  Laterality: N/A;  . Tubal ligation    . Left heart catheterization with  coronary angiogram N/A 06/22/2014    Procedure: LEFT HEART CATHETERIZATION WITH CORONARY ANGIOGRAM;  Surgeon: Burnell Blanks, MD;  Location: [facility] CATH LAB;  Service: Cardiovascular;  Laterality: N/A;  . Eye surgery    . Brain surgery      1997 blood clot on the brain, then had to relieve fluid on the brain  . Multiple extractions with alveoloplasty N/A 08/01/2014    Procedure: EXTRACTIONS OF TEETH NUMBERS 7 8 9 10 11  AND 19 AND ALVEOLOPLASTY UPPER LEFT AND RIGHT QUADRANT;  Surgeon: Isac Caddy, DDS;  Location: [facility];  Service: Oral Surgery;  Laterality: N/A;  . Cardiac catheterization N/A 03/07/2015    Procedure: Left Heart Cath and Coronary Angiography;  Surgeon: Leonie Man, MD;  Location: [facility] CV LAB;  Service: Cardiovascular;  Laterality: N/A;  . Av fistula placement Left 05/10/2015    Procedure: CREATION OF LEFT UPPER ARM ARTERIOVENOUS (AV) FISTULA ;  Surgeon: Elam Dutch, MD;  Location: [facility];  Service: Vascular;  Laterality: Left;    Allergies  Allergen Reactions  . Iohexol Itching and Rash     Code: RASH, Desc: [place] ON PT'S CHART ALLERGIC TO IV DYE 09/04/07/RM, Onset Date: [id number]     Current Outpatient Prescriptions  Medication Sig Dispense Refill  . acetaminophen (TYLENOL) 500 MG tablet Take 500 mg by mouth every 6 (six) hours as needed for mild pain.    Marland Kitchen albuterol (PROVENTIL HFA;VENTOLIN HFA) 108 (90 BASE) MCG/ACT inhaler Inhale 2 puffs into the lungs every 4 (four) hours as needed for wheezing or shortness of breath. 1 Inhaler 3  . allopurinol (ZYLOPRIM) 100 MG tablet Take 1 tablet (100 mg total) by mouth daily. 90 tablet 3  . amLODipine (NORVASC) 10 MG tablet Take 1 tablet (10 mg total) by mouth daily. 90 tablet 3  . aspirin EC 81 MG tablet Take 1 tablet (81 mg total) by mouth daily. 90 tablet 3  . atorvastatin (LIPITOR) 40 MG tablet TAKE 1 TABLET BY MOUTH DAILY AT 6 PM 90 tablet 3  . atorvastatin (LIPITOR) 40 MG tablet TAKE 1 TABLET BY  MOUTH DAILY AT 6 PM 30 tablet 0  . calcitRIOL (ROCALTROL) 0.25 MCG capsule Take 1 capsule (0.25 mcg total) by mouth daily. 90 capsule 3  . clopidogrel (PLAVIX) 75 MG tablet TAKE 1 TABLET BY MOUTH DAILY 30 tablet  0  . donepezil (ARICEPT) 5 MG tablet Take 1 tablet (5 mg total) by mouth at bedtime. 90 tablet 3  . ezetimibe (ZETIA) 10 MG tablet TAKE 1 TABLET BY MOUTH EVERY MORNING 30 tablet 0  . feeding supplement (GLUCERNA SHAKE) LIQD Take 237 mLs by mouth every morning.     . Fluticasone-Salmeterol (ADVAIR DISKUS) 100-50 MCG/DOSE AEPB Inhale 1 puff into the lungs 2 (two) times daily. 1 each 11  . gabapentin (NEURONTIN) 100 MG capsule TAKE 1 CAPSULE BY MOUTH EVERY NIGHT AT BEDTIME 30 capsule 3  . hydrALAZINE (APRESOLINE) 25 MG tablet Take 1 tablet (25 mg total) by mouth 3 (three) times daily. 270 tablet 3  . insulin NPH-regular Human (NOVOLIN 70/30) (70-30) 100 UNIT/ML injection Inject 8 Units into the skin daily with breakfast. 10 mL 11  . isosorbide mononitrate (IMDUR) 60 MG 24 hr tablet TAKE 1 TABLET BY MOUTH DAILY 90 tablet 2  . latanoprost (XALATAN) 0.005 % ophthalmic solution Place 1 drop into both eyes at bedtime.    . nitroGLYCERIN (NITROSTAT) 0.4 MG SL tablet Place 0.4 mg under the tongue every 5 (five) minutes as needed for chest pain.     . ONE TOUCH ULTRA TEST test strip USE TO TEST BLOOD SUGAR TWICE DAILY 100 each 2  . pantoprazole (PROTONIX) 40 MG tablet Take 1 tablet (40 mg total) by mouth 2 (two) times daily. 180 tablet 2  . RANEXA 500 MG 12 hr tablet TAKE 1 TABLET BY MOUTH EVERY 12 HOURS 60 tablet 1  . sertraline (ZOLOFT) 100 MG tablet Take 1 tablet (100 mg total) by mouth daily. 90 tablet 2   No current facility-administered medications for this visit.    Family History  Problem Relation Age of Onset  . Cancer - Other Mother     "Stomach" Cancer  . Diabetes Mother   . Heart disease Mother   . Stomach cancer Mother   . Hypertension Mother   . Lymphoma Father   .  Hypertension Father   . Kidney disease Paternal Grandmother   . Asthma Other   . Diabetes Sister   . Rheum arthritis Mother   . Rheum arthritis Father   . Heart attack Neg Hx   . Stroke Paternal Grandmother     Social History   Social History  . Marital Status: Married    Spouse Name: N/A  . Number of Children: 7  . Years of Education: N/A   Occupational History  . Retired    Social History Main Topics  . Smoking status: Never Smoker   . Smokeless tobacco: Never Used  . Alcohol Use: No     Comment: rare  . Drug Use: No  . Sexual Activity: No   Other Topics Concern  . Not on file   Social History Narrative     REVIEW OF SYSTEMS:   [X]  denotes positive finding, [ ]  denotes negative finding Cardiac  Comments:  Chest pain or chest pressure: x ER a couple of weeks ago (see HPI)  Shortness of breath upon exertion: x   Short of breath when lying flat:    Irregular heart rhythm:        Vascular    Pain in calf, thigh, or hip brought on by ambulation: x Pt denies cramping in calves when walking  Pain in feet at night that wakes you up from your sleep:     Blood clot in your veins:    Leg swelling:  Pulmonary    Oxygen at home:    Productive cough:     Wheezing:  x       Neurologic    Sudden weakness in arms or legs:     Sudden numbness in arms or legs:     Sudden onset of difficulty speaking or slurred speech:    Temporary loss of vision in one eye:     Problems with dizziness:  x Attributes this to her BP swings (see HPI)      Gastrointestinal    Blood in stool:     Vomited blood:         Genitourinary    Burning when urinating:     Blood in urine:        Psychiatric    Major depression:         Hematologic    Bleeding problems:    Problems with blood clotting too easily:        Skin    Rashes or ulcers:        Constitutional    Fever or chills:      PHYSICAL EXAMINATION:  Filed Vitals:   10/25/15 1241  BP: 116/56  Pulse: 68    Temp: 98.9 F (37.2 C)  Resp: 18   Body mass index is 28.91 kg/(m^2).  General:  WDWN in NAD; vital signs documented above Gait: Not observed HENT: WNL, normocephalic Pulmonary: normal non-labored breathing , without Rales, rhonchi,  wheezing Cardiac: regular HR, without  Murmurs, rubs or gallops; without carotid bruits Abdomen: soft, NT, no masses Skin: without rashes Vascular Exam/Pulses:  Right Left  Radial 2+ (normal) 2+ (normal)  DP Unable to palpate  BKA  PT Unable to palpate  BKA   Extremities: without ischemic changes, without Gangrene , without cellulitis; without open wounds; some peeling on the heel, but no ulcer present; +thrill/bruit within the left arm fistula.  Musculoskeletal: no muscle wasting or atrophy  Neurologic: A&O X 3;  No focal weakness or paresthesias are detected Psychiatric:  The pt has Normal affect.   Non-Invasive Vascular Imaging:   ABI's 10/25/15: Right:  0.63 Left:  BKA TBI right:  0.38  Previous ABI's 09/21/14: Right:  0.60 Left:  BKA  Pt meds includes: Statin:  Yes.   Beta Blocker:  No. Aspirin:  Yes.   ACEI:  No. ARB:  No. Other Antiplatelet/Anticoagulant:  Yes.   Plavix   ASSESSMENT/PLAN:: 82 y.o. female with hx of PAD with hx of left BKA   -pt doing well today and her right leg is stable.  She denies any claudication and as far as her legs go, she is doing what she wants to do and states she is limited by her blood pressure swings causing dizziness. -Her ABI's are stable from a year ago and she does not have any non healing wounds. -we will see her back in 6 months with ABI's or sooner if needed. -her left arm fistula does have a good thrill within it-after 3 or more successful runs on HD with the fistula, her tunneled catheter that was placed in IR can be removed.   Leontine Locket, PA-C [facility] [phone]  Clinic MD:  Pt seen and examined in conjunction with Dr. Scot Dock

## 2015-11-02 ENCOUNTER — Other Ambulatory Visit: Payer: Self-pay | Admitting: Endocrinology

## 2015-11-06 ENCOUNTER — Telehealth: Payer: Self-pay | Admitting: Endocrinology

## 2015-11-06 ENCOUNTER — Telehealth: Payer: Self-pay

## 2015-11-06 NOTE — Telephone Encounter (Signed)
Daughter called in checking to see if we have any samples of Ranexa    Called patient and informed patient that samples would be placed up front for her to pickup; patient voiced understanding.

## 2015-11-06 NOTE — Telephone Encounter (Signed)
See note below and please advise, Thanks! 

## 2015-11-06 NOTE — Telephone Encounter (Signed)
Please direct request to [facility], as it appears that is where the rx came from

## 2015-11-06 NOTE — Telephone Encounter (Signed)
PT daughter Marlowe Kays called in for her mother saying that PT was receiving samples for Ranexa and was told by Dr. Loanne Drilling that when she ran out to give [place] a call

## 2015-11-06 NOTE — Telephone Encounter (Signed)
I contacted the pt and advised of the note below and she voiced understanding. She stated her daughter had contacted [place] by mistake.

## 2015-11-09 ENCOUNTER — Telehealth: Payer: Self-pay

## 2015-11-09 NOTE — Telephone Encounter (Signed)
Patient is on the list for [employer] 2017 and may be a good candidate for an AWV in 2017.  Do you think that PCP would be able to do an AWV during the next appt?

## 2015-11-09 NOTE — Telephone Encounter (Signed)
Will do at next ov.  No problem

## 2015-11-09 NOTE — Telephone Encounter (Signed)
See below, Thanks!  

## 2015-11-09 NOTE — Telephone Encounter (Signed)
See note below and please advise, Thanks! 

## 2015-11-16 ENCOUNTER — Other Ambulatory Visit: Payer: Self-pay | Admitting: Endocrinology

## 2015-11-23 ENCOUNTER — Telehealth: Payer: Self-pay | Admitting: Cardiology

## 2015-11-23 NOTE — Telephone Encounter (Signed)
Patient calling the office for samples of medication:   1.  What medication and dosage are you requesting samples for? Renaxa-   2.  Are you currently out of this medication? Not yet- OK through this coming Sat- 6/10

## 2015-11-23 NOTE — Telephone Encounter (Signed)
Patient aware samples placed at the front desk. 

## 2015-11-24 ENCOUNTER — Other Ambulatory Visit: Payer: Self-pay | Admitting: Endocrinology

## 2015-11-27 ENCOUNTER — Other Ambulatory Visit: Payer: Self-pay

## 2015-11-27 MED ORDER — ATORVASTATIN CALCIUM 40 MG PO TABS: ORAL_TABLET | ORAL | Status: DC

## 2015-12-05 ENCOUNTER — Other Ambulatory Visit: Payer: Self-pay | Admitting: Endocrinology

## 2015-12-17 ENCOUNTER — Other Ambulatory Visit: Payer: Self-pay | Admitting: Endocrinology

## 2015-12-18 ENCOUNTER — Ambulatory Visit (INDEPENDENT_AMBULATORY_CARE_PROVIDER_SITE_OTHER): Payer: Medicare Other | Admitting: Endocrinology

## 2015-12-18 VITALS — BP 122/52 | HR 66 | Resp 18 | Wt 169.0 lb

## 2015-12-18 DIAGNOSIS — Z0000 Encounter for general adult medical examination without abnormal findings: Secondary | ICD-10-CM | POA: Diagnosis not present

## 2015-12-18 DIAGNOSIS — E1310 Other specified diabetes mellitus with ketoacidosis without coma: Secondary | ICD-10-CM | POA: Diagnosis not present

## 2015-12-18 LAB — POCT GLYCOSYLATED HEMOGLOBIN (HGB A1C): HEMOGLOBIN A1C: 6.3

## 2015-12-18 MED ORDER — MEMANTINE HCL 5 MG PO TABS
5.0000 mg | ORAL_TABLET | Freq: Two times a day (BID) | ORAL | Status: DC
Start: 2015-12-18 — End: 2016-12-20

## 2015-12-18 NOTE — Progress Notes (Signed)
Subjective:    Patient ID: [name], female    DOB: [birth year], 82 y.o.   MRN: [id number]  HPI The state of at least three ongoing medical problems is addressed today, with interval history of each noted here: Pt returns for f/u of diabetes mellitus: DM type: Insulin-requiring type 2 Dx'ed: [id number] Complications: polyneuropathy, ESRD, CAD, and PAD.  Therapy: insulin since 1991 GDM: never DKA: never Severe hypoglycemia: last episode was in 2014.  Pancreatitis: never Other: therapy is limited by pt's request for least expensive insulin, and by her need for a simple regimen; husband administers insulin to her. Interval history: husband says he gives her 8 units BID.  no cbg record, but states cbg's vary from 110-180.    Past Medical History  Diagnosis Date  . Uterine cancer ([place])   . Diabetes mellitus     type II; peripheral neuropathy  . Hypertension   . GERD (gastroesophageal reflux disease)   . Peripheral vascular disease ([place])     a. s/p L BKA 08/2012.  . Obesity   . Dyslipidemia   . Chronic combined systolic and diastolic CHF (congestive heart failure) (HCC)     a. EF 50-55%, mild LVH and grade 1 diast. Dysfxn b. Grade 3 Diastolic Dysfunction [id number]. b. 02/2015: EF 45-50% by cath.  . Osteoarthritis cervical spine   . DM retinopathy ([place])   . Sleep apnea     with CPAP  . History of shingles   . COPD (chronic obstructive pulmonary disease) ([place])   . Depression   . Peptic ulcer disease     duodenal  . Peptic stricture of esophagus   . Hiatal hernia   . Diverticulosis   . Arthritis   . CAD (coronary artery disease)     a. s/p multiple caths with nonobs CAD;   b. cath 1/10: pLAD 20%, mLAD 40%, pCFX 20%, mCFX 40%, pRCA 60-70%;   c.  Myoview 06/02/12: Low anterior wall scar, no ischemia, EF 37%. d. cath 06/20/2014 70% mid LCx, medical therapy, high risk for PCI due to close proximity to large OM e. cath 03/07/15 pro RCA 50% & pro to mid Cx 75%, both stable, LVEDP 33-59mm  Hg-> medical management  . Cardiomyopathy ([place])     a. Echo 05/31/12: EF 40-45%. b. Echo 06/2014: EF 50-55%. c. Cath 02/2015: EF 45-50%.  . Asthma     Mild  . Pruritic condition     Idiopathic  . Zoster   . Noncompliance   . Bradycardia     a. Not on BB due to this.  . Shortness of breath dyspnea   . CKD (chronic kidney disease), stage III     Dr. Lorrene Reid,    dialyzes Tuesday, Thursday, Saturday    Past Surgical History  Procedure Laterality Date  . Tubal ligation  1967  . Knee arthroscopy  10/1998    Left  . Craniotomy  1997    Left for SDH  . Cataract extraction, bilateral  2005  . Hernia repair    . Esophagogastroduodenoscopy  04/04/2004  . Spine surgery      C-spine and lumbar surgery  . Cholecystectomy  2010  . Cardiac catheterization    . Dexa  7/05  . Amputation Left 09/04/2012    Procedure: AMPUTATION BELOW KNEE;  Surgeon: Angelia Mould, MD;  Location: [facility];  Service: Vascular;  Laterality: Left;  . Abdominal angiogram N/A 08/31/2012    Procedure: ABDOMINAL ANGIOGRAM with runoff poss intervention;  Surgeon: Angelia Mould, MD;  Location: [facility] CATH LAB;  Service: Cardiovascular;  Laterality: N/A;  . Tubal ligation    . Left heart catheterization with coronary angiogram N/A 06/22/2014    Procedure: LEFT HEART CATHETERIZATION WITH CORONARY ANGIOGRAM;  Surgeon: Burnell Blanks, MD;  Location: [facility] CATH LAB;  Service: Cardiovascular;  Laterality: N/A;  . Eye surgery    . Brain surgery      1997 blood clot on the brain, then had to relieve fluid on the brain  . Multiple extractions with alveoloplasty N/A 08/01/2014    Procedure: EXTRACTIONS OF TEETH NUMBERS 7 8 9 10 11  AND 19 AND ALVEOLOPLASTY UPPER LEFT AND RIGHT QUADRANT;  Surgeon: Isac Caddy, DDS;  Location: [facility];  Service: Oral Surgery;  Laterality: N/A;  . Cardiac catheterization N/A 03/07/2015    Procedure: Left Heart Cath and Coronary Angiography;  Surgeon: Leonie Man, MD;  Location: [place] [facility] CV LAB;  Service: Cardiovascular;  Laterality: N/A;  . Av fistula placement Left 05/10/2015    Procedure: CREATION OF LEFT UPPER ARM ARTERIOVENOUS (AV) FISTULA ;  Surgeon: Elam Dutch, MD;  Location: [facility] OR;  Service: Vascular;  Laterality: Left;    Social History   Social History  . Marital Status: Married    Spouse Name: N/A  . Number of Children: 7  . Years of Education: N/A   Occupational History  . Retired    Social History Main Topics  . Smoking status: Never Smoker   . Smokeless tobacco: Never Used  . Alcohol Use: No     Comment: rare  . Drug Use: No  . Sexual Activity: No   Other Topics Concern  . Not on file   Social History Narrative    Current Outpatient Prescriptions on File Prior to Visit  Medication Sig Dispense Refill  . acetaminophen (TYLENOL) 500 MG tablet Take 500 mg by mouth every 6 (six) hours as needed for mild pain.    Marland Kitchen albuterol (PROVENTIL HFA;VENTOLIN HFA) 108 (90 BASE) MCG/ACT inhaler Inhale 2 puffs into the lungs every 4 (four) hours as needed for wheezing or shortness of breath. 1 Inhaler 3  . allopurinol (ZYLOPRIM) 100 MG tablet Take 1 tablet (100 mg total) by mouth daily. 90 tablet 3  . amLODipine (NORVASC) 10 MG tablet Take 1 tablet (10 mg total) by mouth daily. 90 tablet 3  . aspirin EC 81 MG tablet Take 1 tablet (81 mg total) by mouth daily. 90 tablet 3  . atorvastatin (LIPITOR) 40 MG tablet TAKE 1 TABLET BY MOUTH DAILY AT 6 PM 30 tablet 0  . calcitRIOL (ROCALTROL) 0.25 MCG capsule Take 1 capsule (0.25 mcg total) by mouth daily. 90 capsule 3  . clopidogrel (PLAVIX) 75 MG tablet TAKE 1 TABLET BY MOUTH DAILY 90 tablet 0  . donepezil (ARICEPT) 5 MG tablet Take 1 tablet (5 mg total) by mouth at bedtime. 90 tablet 3  . ezetimibe (ZETIA) 10 MG tablet TAKE 1 TABLET BY MOUTH EVERY MORNING 90 tablet 0  . feeding supplement (GLUCERNA SHAKE) LIQD Take 237 mLs by mouth every morning.     . Fluticasone-Salmeterol (ADVAIR DISKUS) 100-50  MCG/DOSE AEPB Inhale 1 puff into the lungs 2 (two) times daily. 1 each 11  . gabapentin (NEURONTIN) 100 MG capsule TAKE 1 CAPSULE BY MOUTH EVERY NIGHT AT BEDTIME 90 capsule 3  . hydrALAZINE (APRESOLINE) 25 MG tablet Take 1 tablet (25 mg total) by mouth 3 (three) times daily. 270 tablet 3  .  insulin NPH-regular Human (NOVOLIN 70/30) (70-30) 100 UNIT/ML injection Inject 8 Units into the skin daily with breakfast. 10 mL 11  . isosorbide mononitrate (IMDUR) 60 MG 24 hr tablet TAKE 1 TABLET BY MOUTH DAILY 90 tablet 2  . latanoprost (XALATAN) 0.005 % ophthalmic solution Place 1 drop into both eyes at bedtime.    . nitroGLYCERIN (NITROSTAT) 0.4 MG SL tablet Place 0.4 mg under the tongue every 5 (five) minutes as needed for chest pain.     . ONE TOUCH ULTRA TEST test strip USE TO TEST BLOOD SUGAR TWICE DAILY 100 each 2  . pantoprazole (PROTONIX) 40 MG tablet Take 1 tablet (40 mg total) by mouth 2 (two) times daily. 180 tablet 2  . RANEXA 500 MG 12 hr tablet TAKE 1 TABLET BY MOUTH EVERY 12 HOURS 60 tablet 1  . sertraline (ZOLOFT) 100 MG tablet Take 1 tablet (100 mg total) by mouth daily. 90 tablet 2   No current facility-administered medications on file prior to visit.    Allergies  Allergen Reactions  . Iohexol Itching and Rash     Code: RASH, Desc: [place] ON PT'S CHART ALLERGIC TO IV DYE 09/04/07/RM, Onset Date: [id number]     Family History  Problem Relation Age of Onset  . Cancer - Other Mother     "Stomach" Cancer  . Diabetes Mother   . Heart disease Mother   . Stomach cancer Mother   . Hypertension Mother   . Lymphoma Father   . Hypertension Father   . Kidney disease Paternal Grandmother   . Asthma Other   . Diabetes Sister   . Rheum arthritis Mother   . Rheum arthritis Father   . Heart attack Neg Hx   . Stroke Paternal Grandmother     BP 122/52 mmHg  Pulse 66  Resp 18  Wt 169 lb (76.658 kg)  Review of Systems Denies LOC    Objective:   Physical Exam VITAL SIGNS:   See vs page GENERAL: no distress.  In wheelchair LLE: BKA.   Right foot: no deformity.  There is bilateral onychomycosis.  Skin: no ulcer, and normal color and temp, on the right foot.  Neuro: sensation is intact to touch on the right foot.   CV: 1+ right leg edema.     Lab Results  Component Value Date   HGBA1C 6.3 12/18/2015      Assessment & Plan:  Insulin-requiring type 2: overcontrolled.  Please reduce the insulin to 8 units each morning, and none in the evening, no matter what your blood sugar is.  Noncompliance with cbg recording and insulin dosing, ongoing.  We discussed risks.  ESRD: in this setting, she is at high risk for hypoglycemia.  She may not need insulin at all.     Subjective:   Patient here for Medicare annual wellness visit and management of other chronic and acute problems.     Risk factors: advanced age    71 of Physicians Providing Medical Care to Patient:  See "snapshot"   Activities of Daily Living: In your present state of health, do you have any difficulty performing the following activities (lives with husband)?:  Preparing food and eating?: yes Bathing yourself: yes Getting dressed: yes  Using the toilet: No  Moving around from place to place: No  In the past year have you fallen or had a near fall?: No    Home Safety: Has smoke detector and wears seat belts. No firearms.  Diet and  Exercise  Current exercise habits: limited by health problems Dietary issues discussed: pt reports a healthy diet.    Depression Screen  Q1: Over the past two weeks, have you felt down, depressed or hopeless? no  Q2: Over the past two weeks, have you felt little interest or pleasure in doing things? no   The following portions of the patient's history were reviewed and updated as appropriate: allergies, current medications, past family history, past medical history, past social history, past surgical history and problem list.   Review of Systems  Denies  hearing loss, and visual loss Objective:   Vision:  Sees opthalmologist Dr Zadie Rhine, so she declines [facility] today.  Hearing: grossly normal Body mass index:  See vs page Msk: pt is unable to perform "get-up-and-go" from a sitting position Cognitive Impairment Assessment: cognition, memory and judgment appear normal.  remembers 0/3 at 5 minutes.  excellent recall.  can easily read and write a sentence.  alert and oriented x 3   Assessment:   Medicare wellness utd on preventive parameters    Plan:   During the course of the visit the patient was educated and counseled about appropriate screening and preventive services including:        Fall prevention   Screening mammography: done by GYN Bone densitometry screening: not indicated.    Diabetes screening  Nutrition counseling   Vaccines / LABS Zostavax / Pneumococcal Vaccine  today   Patient Instructions (the written plan) was given to the patient.  Renato Shin, MD

## 2015-12-18 NOTE — Progress Notes (Signed)
we discussed code status.  pt requests full code, but would not want to be started or maintained on artificial life-support measures if there was not a reasonable chance of recovery 

## 2015-12-18 NOTE — Patient Instructions (Addendum)
Please reduce the insulin to 8 units each morning, and none in the evening, no matter what your blood sugar is.   check your blood sugar twice a day.  vary the time of day when you check, between before the 3 meals, and at bedtime.  also check if you have symptoms of your blood sugar being too high or too low.  please keep a record of the readings and bring it to your next appointment here (or you can bring the meter itself).  You can write it on any piece of paper.  please call us sooner if your blood sugar goes below 70, or if you have a lot of readings over 200.  I have sent a prescription to your pharmacy, for an additional pill for the memory. Controlling your blood pressure and cholesterol drastically reduces the damage diabetes does to your body.  Those who smoke should quit.  Please discuss these with your doctor.  Please consider these measures for your health:  minimize alcohol.  Do not use tobacco products.  Have a colonoscopy at least every 10 years from age 44.  Women should have an annual mammogram from age 34.  Keep firearms safely stored.  Always use seat belts.  have working smoke alarms in your home.  See an eye doctor and dentist regularly.  Never drive under the influence of alcohol or drugs (including prescription drugs).   It is critically important to prevent falling down (keep floor areas well-lit, dry, and free of loose objects.  If you have a cane, walker, or wheelchair, you should use it, even for short trips around the house.  Wear flat-soled shoes.  Also, try not to rush) Please come back for a follow-up appointment in 3 months.

## 2015-12-28 ENCOUNTER — Other Ambulatory Visit: Payer: Self-pay | Admitting: Internal Medicine

## 2015-12-28 ENCOUNTER — Other Ambulatory Visit: Payer: Self-pay | Admitting: Endocrinology

## 2016-01-15 ENCOUNTER — Other Ambulatory Visit: Payer: Self-pay | Admitting: *Deleted

## 2016-01-15 DIAGNOSIS — I739 Peripheral vascular disease, unspecified: Secondary | ICD-10-CM

## 2016-01-16 ENCOUNTER — Other Ambulatory Visit: Payer: Self-pay | Admitting: Endocrinology

## 2016-01-30 ENCOUNTER — Telehealth: Payer: Self-pay | Admitting: *Deleted

## 2016-01-30 NOTE — Telephone Encounter (Signed)
Patient would like to apply for medication assistance, wants forms mailed to her. Address is confirmed.

## 2016-01-30 NOTE — Telephone Encounter (Signed)
Patient Assistance forms for Ranexa mailed to patient.

## 2016-02-02 ENCOUNTER — Ambulatory Visit (INDEPENDENT_AMBULATORY_CARE_PROVIDER_SITE_OTHER): Payer: Medicare Other | Admitting: Cardiology

## 2016-02-02 VITALS — BP 126/62 | HR 80 | Ht 66.0 in | Wt 165.0 lb

## 2016-02-02 DIAGNOSIS — I214 Non-ST elevation (NSTEMI) myocardial infarction: Secondary | ICD-10-CM

## 2016-02-02 DIAGNOSIS — N183 Chronic kidney disease, stage 3 unspecified: Secondary | ICD-10-CM

## 2016-02-02 DIAGNOSIS — I10 Essential (primary) hypertension: Secondary | ICD-10-CM

## 2016-02-02 DIAGNOSIS — E785 Hyperlipidemia, unspecified: Secondary | ICD-10-CM

## 2016-02-02 DIAGNOSIS — I2510 Atherosclerotic heart disease of native coronary artery without angina pectoris: Secondary | ICD-10-CM | POA: Diagnosis not present

## 2016-02-02 DIAGNOSIS — I110 Hypertensive heart disease with heart failure: Secondary | ICD-10-CM

## 2016-02-02 MED ORDER — AMLODIPINE BESYLATE 5 MG PO TABS: 5.0000 mg | ORAL_TABLET | Freq: Every day | ORAL | 3 refills | Status: DC

## 2016-02-02 NOTE — Addendum Note (Signed)
Addended by: Nuala Alpha on: 02/02/2016 11:19 AM   Modules accepted: Orders

## 2016-02-02 NOTE — Patient Instructions (Signed)
Medication Instructions:   DECREASE YOUR AMLODIPINE TO 5 MG ONCE DAILY    Follow-Up:  3 MONTHS WITH DR Meda Coffee     If you need a refill on your cardiac medications before your next appointment, please call your pharmacy.

## 2016-02-02 NOTE — Progress Notes (Signed)
Patient ID: [name], female   DOB: 1930-04-05, 82 y.o.   MRN: [id number]      Cardiology Office Note Date:  02/02/2016  Patient ID:  [name], [name] 01/24/1930, MRN [id number] PCP:  Renato Shin, MD  Cardiologist:  Dr. Meda Coffee   Chief Complaint: TOC visit posthospitalization 4 days.  History of Present Illness:  [name] is a 82 y.o. female with history of chronic combined CHF (EF 45-50% by [facility] 02/2015), CAD (NSTEMI 06/2014 with LHC showing moderate disease treated medically, recent cath 02/2015 with stable anatomy), DM, HTN, HLD, OSA (on CPAP), PVD s/p L BKA 08/2012, CKD stage III, COPD, bradycardia (not on BB due to this) who presents for post-hospital follow-up. Last echo 06/2014: EF 50-55%, no rWMA, grade 3 DD, mildly dilated LA.  She was admitted 9/19-9/22/16 with chest pain, SOB, left arm pain and right leg pain. She was admitted for [place] and had mild troponin elevation to 0.19. Cath 03/07/15 showed stable coronary angiography and elevated LVEDP - elevated troponin was felt more likely supply/demand in setting of CHF. Bidil was added in place of hydralazine, along with Ranexa. She also received IV Lasix post-cath. She had AKI on CKD with elevation of Cr to 1.83 and was kept in the hospital for further monitoring. At discharge her Cr was 1.94.   She comes in to clinic today doing well. She is accompanied by her husband. She was hospitalized in Npovember with acute on chronic CKD and started on HD. She is currently having it done via perm cath, s/p fistula surgery, not mature yet. Her weight 183 lbs, she has PND about 1x/week, some LE edema, no DOE or CP. No bleeding, no palpitations or syncope.  10/23/2015 - the patient was hospitalized on May 1 with acute chest pain after hemodialysis. She has mild troponin elevation with flat friend that was attributed to her CK D stage IV. She now feels significantly better, no fluids are managed to hemodialysis. She has maturing  fistula, in the meantime she is using dialysis catheter on her chest. She denies any chest pain or shortness of breath nor orthopnea or dyspnea, no edema in her right lower extremity, left his updated above the knee.  02/02/2016 - the patient is coming after 3 months, she feels very well, denies interest pain shortness of breath at rest. She has a bony amputation on her left leg and is a wheelchair-bound. She has been having more fluid removed during hemodialysis currently down from 180 pounds to 165 pounds. He denies any orthopnea or paroxysmal dyspnea. She's been compliant to her management. She started to feel some dizziness especially after dialysis.  Past Medical History:  Diagnosis Date  . Arthritis   . Asthma    Mild  . Bradycardia    a. Not on BB due to this.  Marland Kitchen CAD (coronary artery disease)    a. s/p multiple caths with nonobs CAD;   b. cath 1/10: pLAD 20%, mLAD 40%, pCFX 20%, mCFX 40%, pRCA 60-70%;   c.  Myoview 06/02/12: Low anterior wall scar, no ischemia, EF 37%. d. cath 06/20/2014 70% mid LCx, medical therapy, high risk for PCI due to close proximity to large OM e. cath 03/07/15 pro RCA 50% & pro to mid Cx 75%, both stable, LVEDP 33-84mm Hg-> medical management  . Cardiomyopathy ([place])    a. Echo 05/31/12: EF 40-45%. b. Echo 06/2014: EF 50-55%. c. Cath 02/2015: EF 45-50%.  . Chronic combined systolic and diastolic CHF (  congestive heart failure) (HCC)    a. EF 50-55%, mild LVH and grade 1 diast. Dysfxn b. Grade 3 Diastolic Dysfunction [id number]. b. 02/2015: EF 45-50% by cath.  . CKD (chronic kidney disease), stage III    Dr. Lorrene Reid,    dialyzes Tuesday, Thursday, Saturday  . COPD (chronic obstructive pulmonary disease) ([place])   . Depression   . Diabetes mellitus    type II; peripheral neuropathy  . Diverticulosis   . DM retinopathy ([place])   . Dyslipidemia   . GERD (gastroesophageal reflux disease)   . Hiatal hernia   . History of shingles   . Hypertension   . Noncompliance   .  Obesity   . Osteoarthritis cervical spine   . Peptic stricture of esophagus   . Peptic ulcer disease    duodenal  . Peripheral vascular disease ([place])    a. s/p L BKA 08/2012.  Marland Kitchen Pruritic condition    Idiopathic  . Shortness of breath dyspnea   . Sleep apnea    with CPAP  . Uterine cancer ([place])   . Zoster     Past Surgical History:  Procedure Laterality Date  . ABDOMINAL ANGIOGRAM N/A 08/31/2012   Procedure: ABDOMINAL ANGIOGRAM with runoff poss intervention;  Surgeon: Angelia Mould, MD;  Location[facility] CATH LAB;  Service: Cardiovascular;  Laterality: N/A;  . AMPUTATION Left 09/04/2012   Procedure: AMPUTATION BELOW KNEE;  Surgeon: Angelia Mould, MD;  Location: [place];  Service: Vascular;  Laterality: Left;  . AV FISTULA PLACEMENT Left 05/10/2015   Procedure: CREATION OF LEFT UPPER ARM ARTERIOVENOUS (AV) FISTULA ;  Surgeon: Elam Dutch, MD;  Location: [place];  Service: Vascular;  Laterality: Left;  . BRAIN SURGERY     1997 blood clot on the brain, then had to relieve fluid on the brain  . CARDIAC CATHETERIZATION    . CARDIAC CATHETERIZATION N/A 03/07/2015   Procedure: Left Heart Cath and Coronary Angiography;  Surgeon: Leonie Man, MD;  Location: [place] CV LAB;  Service: Cardiovascular;  Laterality: N/A;  . CATARACT EXTRACTION, BILATERAL  2005  . CHOLECYSTECTOMY  2010  . CRANIOTOMY  1997   Left for SDH  . DEXA  7/05  . ESOPHAGOGASTRODUODENOSCOPY  04/04/2004  . EYE SURGERY    . HERNIA REPAIR    . KNEE ARTHROSCOPY  10/1998   Left  . LEFT HEART CATHETERIZATION WITH CORONARY ANGIOGRAM N/A 06/22/2014   Procedure: LEFT HEART CATHETERIZATION WITH CORONARY ANGIOGRAM;  Surgeon: Burnell Blanks, MD;  Location[facility] C[facility] LAB;  Service: Cardiovascular;  Laterality: N/A;  . MULTIPLE EXTRACTIONS WITH ALVEOLOPLASTY N/A 08/01/2014   Procedure: EXTRACTIONS OF TEETH NUMBERS 7 8 9 10 11  AND 19 AND ALVEOLOPLASTY UPPER LEFT AND RIGHT QUADRANT;  Surgeon: Isac Caddy,  DDS;  Location: [place];  Service: Oral Surgery;  Laterality: N/A;  . SPINE SURGERY     C-spine and lumbar surgery  . TUBAL LIGATION  1967  . TUBAL LIGATION      Current Outpatient Prescriptions  Medication Sig Dispense Refill  . acetaminophen (TYLENOL) 500 MG tablet Take 500 mg by mouth every 6 (six) hours as needed for mild pain.    Marland Kitchen albuterol (PROVENTIL HFA;VENTOLIN HFA) 108 (90 BASE) MCG/ACT inhaler Inhale 2 puffs into the lungs every 4 (four) hours as needed for wheezing or shortness of breath. 1 Inhaler 3  . allopurinol (ZYLOPRIM) 100 MG tablet Take 1 tablet (100 mg total) by mouth daily. 90 tablet 3  .  amLODipine (NORVASC) 10 MG tablet Take 1 tablet (10 mg total) by mouth daily. 90 tablet 3  . aspirin EC 81 MG tablet Take 1 tablet (81 mg total) by mouth daily. 90 tablet 3  . atorvastatin (LIPITOR) 40 MG tablet TAKE 1 TABLET BY MOUTH DAILY AT 6 PM 90 tablet 0  . calcitRIOL (ROCALTROL) 0.25 MCG capsule Take 1 capsule (0.25 mcg total) by mouth daily. 90 capsule 3  . clopidogrel (PLAVIX) 75 MG tablet TAKE 1 TABLET BY MOUTH DAILY 90 tablet 0  . donepezil (ARICEPT) 5 MG tablet Take 1 tablet (5 mg total) by mouth at bedtime. 90 tablet 3  . ezetimibe (ZETIA) 10 MG tablet TAKE 1 TABLET BY MOUTH EVERY MORNING 90 tablet 0  . feeding supplement (GLUCERNA SHAKE) LIQD Take 237 mLs by mouth every morning.     . Fluticasone-Salmeterol (ADVAIR DISKUS) 100-50 MCG/DOSE AEPB Inhale 1 puff into the lungs 2 (two) times daily. 1 each 11  . gabapentin (NEURONTIN) 100 MG capsule TAKE 1 CAPSULE BY MOUTH EVERY NIGHT AT BEDTIME 90 capsule 3  . hydrALAZINE (APRESOLINE) 25 MG tablet Take 1 tablet (25 mg total) by mouth 3 (three) times daily. 270 tablet 3  . insulin NPH-regular Human (NOVOLIN 70/30) (70-30) 100 UNIT/ML injection Inject 8 Units into the skin daily with breakfast. 10 mL 11  . isosorbide mononitrate (IMDUR) 60 MG 24 hr tablet TAKE 1 TABLET BY MOUTH DAILY 90 tablet 2  . latanoprost (XALATAN) 0.005 %  ophthalmic solution Place 1 drop into both eyes at bedtime.    . memantine (NAMENDA) 5 MG tablet Take 1 tablet (5 mg total) by mouth 2 (two) times daily. 60 tablet 11  . nitroGLYCERIN (NITROSTAT) 0.4 MG SL tablet Place 0.4 mg under the tongue every 5 (five) minutes as needed for chest pain.     . ONE TOUCH ULTRA TEST test strip USE TO TEST BLOOD SUGAR TWICE DAILY 100 each 2  . pantoprazole (PROTONIX) 40 MG tablet Take 1 tablet (40 mg total) by mouth 2 (two) times daily. 180 tablet 2  . RANEXA 500 MG 12 hr tablet TAKE 1 TABLET BY MOUTH EVERY 12 HOURS 60 tablet 1  . sertraline (ZOLOFT) 100 MG tablet Take 1 tablet (100 mg total) by mouth daily. 90 tablet 2   No current facility-administered medications for this visit.    Allergies:   Iohexol   Social History:  The patient  reports that she has never smoked. She has never used smokeless tobacco. She reports that she does not drink alcohol or use drugs.   Family History:  The patient's family history includes Asthma in her other; Cancer - Other in her mother; Diabetes in her mother and sister; Heart disease in her mother; Hypertension in her father and mother; Kidney disease in her paternal grandmother; Lymphoma in her father; Rheum arthritis in her father and mother; Stomach cancer in her mother; Stroke in her paternal grandmother.*  ROS:  Please see the history of present illness.    All other systems are reviewed and otherwise negative.   PHYSICAL EXAM:  VS:  BP 126/62   Pulse 80   Ht 5\' 6"  (1.676 m)   Wt 165 lb (74.8 kg)   BMI 26.63 kg/m  BMI: Body mass index is 26.63 kg/m. Well nourished obese AAF, in no acute distress HEENT: normocephalic, atraumatic Neck: no JVD, carotid bruits or masses Cardiac:  normal S1, S2; RRR; no murmurs, rubs, or gallops Lungs:  clear to auscultation bilaterally,  no wheezing, rhonchi or rales Abd: soft, nontender, no hepatomegaly, + BS MS: no deformity or atrophy Ext: no edema on the right, prosthesis on  the left, right radial cath site with resolving ecchymosis, no hematoma, good pulse Skin: warm and dry, no rash Neuro:  moves all extremities spontaneously, no focal abnormalities noted, follows commands Psych: euthymic mood, full affect  EKG:  Done today shows NSR 71bpm, lateral infarct age undetermined, nonspecific ST-T changes, QTc 454ms, no acute change from prior.  Recent Labs: 04/24/2015: B Natriuretic Peptide 1,594.7 04/25/2015: Magnesium 1.9 08/17/2015: ALT 19 10/19/2015: BUN 51; Creatinine, Ser 3.90; Hemoglobin 11.0; Platelets 255; Potassium 4.2; Sodium 136  No results found for requested labs within last 8760 hours.   CrCl cannot be calculated (Patient's most recent lab result is older than the maximum 21 days allowed.).   Wt Readings from Last 3 Encounters:  02/02/16 165 lb (74.8 kg)  12/18/15 169 lb (76.7 kg)  10/25/15 179 lb (81.2 kg)    Other studies reviewed: Additional studies/records reviewed today include: summarized above   ASSESSMENT AND PLAN:  1. CAD, with most recent admission in May 2017 for Elevated troponin attributed to CKD stage IV, now with more significant fluid removal she is asymptomatic and appears euvolemic. Continue current management. 2. Acute on chronic combined CHF - now euvolemic down to 165 pounds from 180 lbs, fluid management hemodialysis. CKD stage V - ESRD on HD. 3. Essential HTN - now rather hypotension after hemodialysis, I would decrease amlodipine to 5 mg daily.. 4. H/o sinus bradycardia - stable. Not on BB due to this.  Follow up in 3 months. We can further down titrate her medication at that time if needed.  Romeo Rabon  02/02/2016 10:50 AM     [facility] [address] [place] [address] [phone] (office)  [phone] (fax)

## 2016-02-17 ENCOUNTER — Encounter (HOSPITAL_COMMUNITY): Payer: Self-pay

## 2016-02-17 ENCOUNTER — Non-Acute Institutional Stay (HOSPITAL_COMMUNITY)
Admission: EM | Admit: 2016-02-17 | Discharge: 2016-02-17 | Disposition: A | Discharge status: Home / Self Care | Payer: Medicare Other | Attending: Emergency Medicine | Admitting: Emergency Medicine

## 2016-02-17 ENCOUNTER — Non-Acute Institutional Stay (HOSPITAL_COMMUNITY): Payer: Medicare Other

## 2016-02-17 ENCOUNTER — Emergency Department (HOSPITAL_COMMUNITY): Payer: Medicare Other

## 2016-02-17 DIAGNOSIS — D649 Anemia, unspecified: Secondary | ICD-10-CM | POA: Insufficient documentation

## 2016-02-17 DIAGNOSIS — E785 Hyperlipidemia, unspecified: Secondary | ICD-10-CM | POA: Diagnosis not present

## 2016-02-17 DIAGNOSIS — Z7902 Long term (current) use of antithrombotics/antiplatelets: Secondary | ICD-10-CM | POA: Diagnosis not present

## 2016-02-17 DIAGNOSIS — Z7982 Long term (current) use of aspirin: Secondary | ICD-10-CM | POA: Insufficient documentation

## 2016-02-17 DIAGNOSIS — R0789 Other chest pain: Secondary | ICD-10-CM | POA: Diagnosis not present

## 2016-02-17 DIAGNOSIS — Z992 Dependence on renal dialysis: Secondary | ICD-10-CM | POA: Diagnosis not present

## 2016-02-17 DIAGNOSIS — I252 Old myocardial infarction: Secondary | ICD-10-CM | POA: Diagnosis not present

## 2016-02-17 DIAGNOSIS — Z794 Long term (current) use of insulin: Secondary | ICD-10-CM | POA: Diagnosis not present

## 2016-02-17 DIAGNOSIS — N186 End stage renal disease: Secondary | ICD-10-CM | POA: Insufficient documentation

## 2016-02-17 DIAGNOSIS — E11319 Type 2 diabetes mellitus with unspecified diabetic retinopathy without macular edema: Secondary | ICD-10-CM | POA: Insufficient documentation

## 2016-02-17 DIAGNOSIS — I5042 Chronic combined systolic (congestive) and diastolic (congestive) heart failure: Secondary | ICD-10-CM | POA: Insufficient documentation

## 2016-02-17 DIAGNOSIS — I2510 Atherosclerotic heart disease of native coronary artery without angina pectoris: Secondary | ICD-10-CM | POA: Diagnosis not present

## 2016-02-17 DIAGNOSIS — Z79899 Other long term (current) drug therapy: Secondary | ICD-10-CM | POA: Insufficient documentation

## 2016-02-17 DIAGNOSIS — I132 Hypertensive heart and chronic kidney disease with heart failure and with stage 5 chronic kidney disease, or end stage renal disease: Secondary | ICD-10-CM | POA: Insufficient documentation

## 2016-02-17 DIAGNOSIS — I739 Peripheral vascular disease, unspecified: Secondary | ICD-10-CM | POA: Insufficient documentation

## 2016-02-17 DIAGNOSIS — J449 Chronic obstructive pulmonary disease, unspecified: Secondary | ICD-10-CM | POA: Diagnosis not present

## 2016-02-17 DIAGNOSIS — Z8542 Personal history of malignant neoplasm of other parts of uterus: Secondary | ICD-10-CM | POA: Diagnosis not present

## 2016-02-17 DIAGNOSIS — Z89512 Acquired absence of left leg below knee: Secondary | ICD-10-CM | POA: Insufficient documentation

## 2016-02-17 DIAGNOSIS — K219 Gastro-esophageal reflux disease without esophagitis: Secondary | ICD-10-CM | POA: Diagnosis not present

## 2016-02-17 DIAGNOSIS — R079 Chest pain, unspecified: Secondary | ICD-10-CM | POA: Diagnosis present

## 2016-02-17 LAB — CBC
HCT: 34.5 % — ABNORMAL LOW (ref 36.0–46.0)
Hemoglobin: 10.9 g/dL — ABNORMAL LOW (ref 12.0–15.0)
MCH: 30 pg (ref 26.0–34.0)
MCHC: 31.6 g/dL (ref 30.0–36.0)
MCV: 95 fL (ref 78.0–100.0)
PLATELETS: 247 10*3/uL (ref 150–400)
RBC: 3.63 MIL/uL — AB (ref 3.87–5.11)
RDW: 14.8 % (ref 11.5–15.5)
WBC: 13.9 10*3/uL — AB (ref 4.0–10.5)

## 2016-02-17 LAB — I-STAT TROPONIN, ED
TROPONIN I, POC: 0.19 ng/mL — AB (ref 0.00–0.08)
Troponin i, poc: 0.14 ng/mL (ref 0.00–0.08)

## 2016-02-17 LAB — BASIC METABOLIC PANEL
Anion gap: 12 (ref 5–15)
BUN: 24 mg/dL — AB (ref 6–20)
CHLORIDE: 99 mmol/L — AB (ref 101–111)
CO2: 27 mmol/L (ref 22–32)
CREATININE: 2.77 mg/dL — AB (ref 0.44–1.00)
Calcium: 9 mg/dL (ref 8.9–10.3)
GFR, EST AFRICAN AMERICAN: 17 mL/min — AB (ref >60)
GFR, EST NON AFRICAN AMERICAN: 15 mL/min — AB (ref >60)
Glucose, Bld: 190 mg/dL — ABNORMAL HIGH (ref 65–99)
Potassium: 3.4 mmol/L — ABNORMAL LOW (ref 3.5–5.1)
SODIUM: 138 mmol/L (ref 135–145)

## 2016-02-17 LAB — D-DIMER, QUANTITATIVE: D-Dimer, Quant: 1.04 ug/mL-FEU — ABNORMAL HIGH (ref 0.00–0.50)

## 2016-02-17 MED ORDER — TECHNETIUM TO 99M ALBUMIN AGGREGATED
4.0800 | Freq: Once | INTRAVENOUS | Status: AC | PRN
  Administered 2016-02-17: 4 via INTRAVENOUS

## 2016-02-17 MED ORDER — SODIUM CHLORIDE 0.9 % IV SOLN
125.0000 mg | Freq: Once | INTRAVENOUS | Status: AC
  Administered 2016-02-17: 125 mg via INTRAVENOUS
  Filled 2016-02-17: qty 10

## 2016-02-17 MED ORDER — ACETAMINOPHEN 500 MG PO TABS
1000.0000 mg | ORAL_TABLET | Freq: Once | ORAL | Status: AC
  Administered 2016-02-17: 1000 mg via ORAL
  Filled 2016-02-17: qty 2

## 2016-02-17 MED ORDER — SODIUM CHLORIDE 0.9 % IV SOLN
Freq: Once | INTRAVENOUS | Status: AC
  Administered 2016-02-17: 20:00:00 via INTRAVENOUS

## 2016-02-17 MED ORDER — SODIUM CHLORIDE 0.9 % IV SOLN: 100.0000 mL | INTRAVENOUS | Status: DC | PRN

## 2016-02-17 MED ORDER — MORPHINE SULFATE (PF) 2 MG/ML IV SOLN
2.0000 mg | Freq: Once | INTRAVENOUS | Status: AC
  Administered 2016-02-17: 2 mg via INTRAVENOUS
  Filled 2016-02-17: qty 1

## 2016-02-17 MED ORDER — LIDOCAINE-PRILOCAINE 2.5-2.5 % EX CREA: 1.0000 "application " | TOPICAL_CREAM | CUTANEOUS | Status: DC | PRN

## 2016-02-17 MED ORDER — FENTANYL CITRATE (PF) 100 MCG/2ML IJ SOLN
25.0000 ug | Freq: Once | INTRAMUSCULAR | Status: AC
  Administered 2016-02-17: 25 ug via INTRAVENOUS
  Filled 2016-02-17: qty 2

## 2016-02-17 MED ORDER — ALTEPLASE 2 MG IJ SOLR: 2.0000 mg | Freq: Once | INTRAMUSCULAR | Status: DC | PRN

## 2016-02-17 MED ORDER — DOXERCALCIFEROL 4 MCG/2ML IV SOLN
1.0000 ug | INTRAVENOUS | Status: DC
  Administered 2016-02-17: 1 ug via INTRAVENOUS

## 2016-02-17 MED ORDER — TECHNETIUM TC 99M DIETHYLENETRIAME-PENTAACETIC ACID: 32.3000 | Freq: Once | INTRAVENOUS | Status: DC | PRN

## 2016-02-17 MED ORDER — LIDOCAINE HCL (PF) 1 % IJ SOLN: 5.0000 mL | INTRAMUSCULAR | Status: DC | PRN

## 2016-02-17 MED ORDER — PENTAFLUOROPROP-TETRAFLUOROETH EX AERO: 1.0000 "application " | INHALATION_SPRAY | CUTANEOUS | Status: DC | PRN

## 2016-02-17 MED ORDER — HEPARIN SODIUM (PORCINE) 1000 UNIT/ML DIALYSIS: 6000.0000 [IU] | INTRAMUSCULAR | Status: DC | PRN

## 2016-02-17 MED ORDER — SODIUM CHLORIDE 0.9 % IV SOLN
100.0000 mL | INTRAVENOUS | Status: DC | PRN
Start: 2016-02-17 — End: 2016-02-18

## 2016-02-17 MED ORDER — HEPARIN SODIUM (PORCINE) 1000 UNIT/ML DIALYSIS: 1000.0000 [IU] | INTRAMUSCULAR | Status: DC | PRN

## 2016-02-17 NOTE — ED Notes (Signed)
Spouse has left ED to go home - to return to pick up pt in [place]. Will call RN's phone when arrives.

## 2016-02-17 NOTE — ED Notes (Signed)
Spouse and daughter aware pt being d/c'd. Spouse placed pt's prosthesis leg on pt. RN to assist pt w/changing into paper scrubs d/t no clothing in ED.

## 2016-02-17 NOTE — ED Notes (Signed)
Transported to [facility] via stretcher. FAmily at bedside.

## 2016-02-17 NOTE — ED Notes (Addendum)
Pt has returned from dialysis. Lying w/head of stretcher elevated, talking w/spouse and daughter. States chest pain on left and right side continues. Monitor applied to pt. Pt talking in full sentences. Dr Leonette Monarch aware pt in room.

## 2016-02-17 NOTE — ED Notes (Signed)
PA at bedside updating patient.

## 2016-02-17 NOTE — ED Notes (Signed)
Spouse - [phone]

## 2016-02-17 NOTE — ED Notes (Signed)
Dressed pt in paper scrubs x 2 staff. Pt has head scarf and prosthetic placed on left leg by spouse.

## 2016-02-17 NOTE — ED Provider Notes (Signed)
I assumed care of this patient from Dr. Winfred Leeds at 1600.  Please see their note for further details of Hx, PE.  Briefly patient is a 82 y.o. female with right sided chest pain at [facility] site. Presentation highly consistent with ACS. No evidence of infection at the site. D-dimer positive and requiring VQ scan to rule out pulmonary embolism.  Currently awaiting VQ scan. Patient was sent to dialysis as today's today and will not be able to establish outpatient visit today.  Patient return for dialysis without come location. Sent to VQ scan which revealed no evidence of pulmonary embolism.  Patient safe for discharge with strict return precautions. Patient to follow-up with PCP as needed and neurology for discussions of catheter removal.  Disposition: Discharge  Condition: Good  I have discussed the results, Dx and Tx plan with the patient and family who expressed understanding and agree(s) with the plan. Discharge instructions discussed at great length. The patient and family was given strict return precautions who verbalized understanding of the instructions. No further questions at time of discharge.    Current Discharge Medication List      Follow Up: Renato Shin, MD [place] [employer] [place] [address] [phone]  Call  As needed  Nephrology  Schedule an appointment as soon as possible for a visit  to discuss catheter removal      Fatima Blank, MD 02/17/16 2031

## 2016-02-17 NOTE — ED Notes (Signed)
Pt's daughter and spouse waiting on pt in room. Coffee given to both. Aware pt is in dialysis.

## 2016-02-17 NOTE — ED Notes (Signed)
Pt in dialysis

## 2016-02-17 NOTE — ED Notes (Addendum)
Attempted to d/c pt - pt unable to tolerate movement states d/t chest pain continues. Spouse states he is unable to take pt home d/t her chest pain continues. States if he takes her home, he will have to bring her back to ED. Dr Leonette Monarch aware.

## 2016-02-17 NOTE — ED Notes (Signed)
Report received from dialysis - on machine x 4 hours, BP increased for last hour - 160s/170s - currently 152/102 - HR 72. Advised 1 Liter removed.

## 2016-02-17 NOTE — ED Notes (Signed)
Dean, Dialysis RN, called and advised getting ready to take pt off dialysis machine.

## 2016-02-17 NOTE — ED Notes (Signed)
Pt and family aware Eddie, NM Tech, called and advised transporter should be arriving soon to transport pt.

## 2016-02-17 NOTE — ED Notes (Signed)
Spoke with [facility] verifying current IV was ok for study.  Was told it would be hours before test completed due to other scans in process.

## 2016-02-17 NOTE — ED Notes (Signed)
Dr Leonette Monarch aware pt has returned from [facility].

## 2016-02-17 NOTE — ED Provider Notes (Signed)
[facility] DEPT Provider Note   CSN: [id number] Arrival date & time: 02/17/16  0848     History   Chief Complaint Chief Complaint  Patient presents with  . Chest Pain    HPI [name] is a 82 y.o. female with history of CAD, ESRD, CHF who presents with right-sided chest pain over dialysis catheter that began around midnight this morning. Patient states she went to then had a good night sleep, however she continues to have a constant, achy pain over her catheter. The pain does not radiate. Patient states her pain is worse when she breathes and walks. Patient has shortness of breath at rest that is worsened on exertion. Patient has had associated dizziness, lightheadedness, and diaphoresis. Patient took nitroglycerin without relief. Patient denies any abdominal pain, nausea, vomiting, dysuria, leg pain or swelling, recent long trips, recent surgeries or known cancer. The patient is not a smoker. Patient has had catheter and has been in dialysis since December 2016. Patient is due for dialysis today.  HPI  Past Medical History:  Diagnosis Date  . Arthritis   . Asthma    Mild  . Bradycardia    a. Not on BB due to this.  Marland Kitchen CAD (coronary artery disease)    a. s/p multiple caths with nonobs CAD;   b. cath 1/10: pLAD 20%, mLAD 40%, pCFX 20%, mCFX 40%, pRCA 60-70%;   c.  Myoview 06/02/12: Low anterior wall scar, no ischemia, EF 37%. d. cath 06/20/2014 70% mid LCx, medical therapy, high risk for PCI due to close proximity to large OM e. cath 03/07/15 pro RCA 50% & pro to mid Cx 75%, both stable, LVEDP 33-66mm Hg-> medical management  . Cardiomyopathy ([place])    a. Echo 05/31/12: EF 40-45%. b. Echo 06/2014: EF 50-55%. c. Cath 02/2015: EF 45-50%.  . Chronic combined systolic and diastolic CHF (congestive heart failure) (HCC)    a. EF 50-55%, mild LVH and grade 1 diast. Dysfxn b. Grade 3 Diastolic Dysfunction [id number]. b. 02/2015: EF 45-50% by cath.  . CKD (chronic kidney disease), stage III     Dr. Lorrene Reid,    dialyzes Tuesday, Thursday, Saturday  . COPD (chronic obstructive pulmonary disease) ([place])   . Depression   . Diabetes mellitus    type II; peripheral neuropathy  . Diverticulosis   . DM retinopathy ([place])   . Dyslipidemia   . GERD (gastroesophageal reflux disease)   . Hiatal hernia   . History of shingles   . Hypertension   . Noncompliance   . Obesity   . Osteoarthritis cervical spine   . Peptic stricture of esophagus   . Peptic ulcer disease    duodenal  . Peripheral vascular disease ([place])    a. s/p L BKA 08/2012.  Marland Kitchen Pruritic condition    Idiopathic  . Shortness of breath dyspnea   . Sleep apnea    with CPAP  . Uterine cancer ([place])   . Zoster     Patient Active Problem List   Diagnosis Date Noted  . ESRD needing dialysis ([place]) 02/17/2016  . Coronary artery disease involving native coronary artery of native heart without angina pectoris 10/23/2015  . HLD (hyperlipidemia) 10/23/2015  . NSTEMI (non-ST elevated myocardial infarction) ([place]) 10/23/2015  . Hypertensive heart disease with heart failure ([place]) 10/23/2015  . CKD (chronic kidney disease), stage III 10/23/2015  . Chest pain syndrome 10/17/2015  . COPD (chronic obstructive pulmonary disease) ([place]) 10/17/2015  . Prolonged Q-T interval on ECG 10/17/2015  .  Controlled insulin dependent diabetes mellitus ([place]) 09/18/2015  . NSVT (nonsustained ventricular tachycardia) ([place]) 04/26/2015  . Chronic diastolic congestive heart failure, NYHA class 3 ([place])   . Elevated troponin 03/06/2015  . Dizziness 10/25/2014  . Caries 08/01/2014  . Sinus bradycardia 06/19/2014  . Contrast media allergy 06/19/2014  . Gout 12/09/2013  . Avulsion injury of left iliopsoas and hematoma 12/06/2013  . Gait instability: Status post left BKA 09/10/2012  . Vitamin D deficiency 02/20/2012  . Encounter for long-term (current) use of other medications 11/21/2011  . HYPOKALEMIA 01/22/2010  . CAROTID BRUIT, LEFT 05/18/2009  .  DYSPHAGIA 04/05/2009  . Obstructive sleep apnea 10/12/2008  . CKD (chronic kidney disease) stage V requiring chronic dialysis ([place]) 07/19/2008  . Osteoporosis 06/17/2008  . Hyperlipidemia 07/16/2007  . DEPRESSION 07/16/2007  . Hereditary and idiopathic peripheral neuropathy 07/16/2007  . ASTHMA 07/16/2007  . COPD 07/16/2007  . PEPTIC ULCER DISEASE 07/16/2007  . Essential hypertension 01/22/2007  . Coronary artery disease due to lipid rich plaque; 70% ostial-proximal AV Groove Cx - not good PCI target 01/22/2007  . PERIPHERAL VASCULAR DISEASE 01/22/2007  . GERD 01/22/2007  . OSTEOARTHRITIS 01/22/2007    Past Surgical History:  Procedure Laterality Date  . ABDOMINAL ANGIOGRAM N/A 08/31/2012   Procedure: ABDOMINAL ANGIOGRAM with runoff poss intervention;  Surgeon: Angelia Mould, MD;  Location[facility] C[facility] LAB;  Service: Cardiovascular;  Laterality: N/A;  . AMPUTATION Left 09/04/2012   Procedure: AMPUTATION BELOW KNEE;  Surgeon: Angelia Mould, MD;  Location: [place];  Service: Vascular;  Laterality: Left;  . AV FISTULA PLACEMENT Left 05/10/2015   Procedure: CREATION OF LEFT UPPER ARM ARTERIOVENOUS (AV) FISTULA ;  Surgeon: Elam Dutch, MD;  Location: [place];  Service: Vascular;  Laterality: Left;  . BRAIN SURGERY     1997 blood clot on the brain, then had to relieve fluid on the brain  . CARDIAC CATHETERIZATION    . CARDIAC CATHETERIZATION N/A 03/07/2015   Procedure: Left Heart Cath and Coronary Angiography;  Surgeon: Leonie Man, MD;  Location: [facility] CV LAB;  Service: Cardiovascular;  Laterality: N/A;  . CATARACT EXTRACTION, BILATERAL  2005  . CHOLECYSTECTOMY  2010  . CRANIOTOMY  1997   Left for SDH  . DEXA  7/05  . ESOPHAGOGASTRODUODENOSCOPY  04/04/2004  . EYE SURGERY    . HERNIA REPAIR    . KNEE ARTHROSCOPY  10/1998   Left  . LEFT HEART CATHETERIZATION WITH CORONARY ANGIOGRAM N/A 06/22/2014   Procedure: LEFT HEART CATHETERIZATION WITH CORONARY ANGIOGRAM;   Surgeon: Burnell Blanks, MD;  Location: [facility] CATH LAB;  Service: Cardiovascular;  Laterality: N/A;  . MULTIPLE EXTRACTIONS WITH ALVEOLOPLASTY N/A 08/01/2014   Procedure: EXTRACTIONS OF TEETH NUMBERS 7 8 9 10 11  AND 19 AND ALVEOLOPLASTY UPPER LEFT AND RIGHT QUADRANT;  Surgeon: Isac Caddy, DDS;  Location: [facility];  Service: Oral Surgery;  Laterality: N/A;  . SPINE SURGERY     C-spine and lumbar surgery  . TUBAL LIGATION  1967  . TUBAL LIGATION      OB History    No data available       Home Medications    Prior to Admission medications   Medication Sig Start Date End Date Taking? Authorizing Provider  acetaminophen (TYLENOL) 500 MG tablet Take 500 mg by mouth every 6 (six) hours as needed for mild pain.   Yes Historical Provider, MD  albuterol (PROVENTIL HFA;VENTOLIN HFA) 108 (90 BASE) MCG/ACT inhaler Inhale 2 puffs  into the lungs every 4 (four) hours as needed for wheezing or shortness of breath. 04/19/15  Yes Renato Shin, MD  allopurinol (ZYLOPRIM) 100 MG tablet Take 1 tablet (100 mg total) by mouth daily. 07/21/15  Yes Renato Shin, MD  amLODipine (NORVASC) 5 MG tablet Take 1 tablet (5 mg total) by mouth daily. 02/02/16 05/02/16 Yes Dorothy Spark, MD  aspirin EC 81 MG tablet Take 1 tablet (81 mg total) by mouth daily. Patient taking differently: Take 81 mg by mouth every morning.  03/16/15  Yes Dayna N Dunn, PA-C  atorvastatin (LIPITOR) 40 MG tablet TAKE 1 TABLET BY MOUTH DAILY AT 6 PM 12/28/15  Yes Renato Shin, MD  clopidogrel (PLAVIX) 75 MG tablet TAKE 1 TABLET BY MOUTH DAILY 01/16/16  Yes Renato Shin, MD  colestipol (COLESTID) 1 g tablet Take 1 g by mouth 3 (three) times daily. 02/15/16  Yes Historical Provider, MD  ezetimibe (ZETIA) 10 MG tablet TAKE 1 TABLET BY MOUTH EVERY MORNING 01/16/16  Yes Renato Shin, MD  feeding supplement (GLUCERNA SHAKE) LIQD Take 237 mLs by mouth every morning.    Yes Historical Provider, MD  Fluticasone-Salmeterol (ADVAIR DISKUS) 100-50  MCG/DOSE AEPB Inhale 1 puff into the lungs 2 (two) times daily. 04/19/15  Yes Renato Shin, MD  gabapentin (NEURONTIN) 100 MG capsule TAKE 1 CAPSULE BY MOUTH EVERY NIGHT AT BEDTIME 12/18/15  Yes Renato Shin, MD  hydrALAZINE (APRESOLINE) 25 MG tablet Take 1 tablet (25 mg total) by mouth 3 (three) times daily. 07/21/15  Yes Renato Shin, MD  insulin NPH-regular Human (NOVOLIN 70/30) (70-30) 100 UNIT/ML injection Inject 8 Units into the skin daily with breakfast. 09/18/15  Yes Renato Shin, MD  isosorbide mononitrate (IMDUR) 60 MG 24 hr tablet TAKE 1 TABLET BY MOUTH DAILY 09/26/15  Yes Dorothy Spark, MD  latanoprost (XALATAN) 0.005 % ophthalmic solution Place 1 drop into both eyes at bedtime.   Yes Historical Provider, MD  memantine (NAMENDA) 5 MG tablet Take 1 tablet (5 mg total) by mouth 2 (two) times daily. 12/18/15  Yes Renato Shin, MD  nitroGLYCERIN (NITROSTAT) 0.4 MG SL tablet Place 0.4 mg under the tongue every 5 (five) minutes as needed for chest pain.    Yes Historical Provider, MD  ONE TOUCH ULTRA TEST test strip USE TO TEST BLOOD SUGAR TWICE DAILY 07/14/15  Yes Renato Shin, MD  pantoprazole (PROTONIX) 40 MG tablet Take 1 tablet (40 mg total) by mouth 2 (two) times daily. 07/21/15  Yes Renato Shin, MD  RANEXA 500 MG 12 hr tablet TAKE 1 TABLET BY MOUTH EVERY 12 HOURS 07/21/15  Yes Gildardo Cranker, DO  sertraline (ZOLOFT) 100 MG tablet Take 1 tablet (100 mg total) by mouth daily. 07/21/15  Yes Renato Shin, MD  donepezil (ARICEPT) 5 MG tablet Take 1 tablet (5 mg total) by mouth at bedtime. Patient not taking: Reported on 02/17/2016 07/21/15   Renato Shin, MD    Family History Family History  Problem Relation Age of Onset  . Cancer - Other Mother     "Stomach" Cancer  . Diabetes Mother   . Heart disease Mother   . Stomach cancer Mother   . Hypertension Mother   . Rheum arthritis Mother   . Lymphoma Father   . Hypertension Father   . Rheum arthritis Father   . Kidney disease Paternal Grandmother     . Stroke Paternal Grandmother   . Asthma Other   . Diabetes Sister   . Heart attack Neg Hx  Social History Social History  Substance Use Topics  . Smoking status: Never Smoker  . Smokeless tobacco: Never Used  . Alcohol use No     Comment: rare     Allergies   Iohexol   Review of Systems Review of Systems  Constitutional: Positive for diaphoresis. Negative for chills and fever.  HENT: Negative for facial swelling and sore throat.   Respiratory: Positive for shortness of breath.   Cardiovascular: Positive for chest pain.  Gastrointestinal: Negative for abdominal pain, nausea and vomiting.  Genitourinary: Negative for dysuria.  Musculoskeletal: Negative for back pain.  Skin: Negative for rash and wound.  Neurological: Positive for dizziness and light-headedness. Negative for headaches.  Psychiatric/Behavioral: The patient is not nervous/anxious.      Physical Exam Updated Vital Signs BP 154/56   Pulse 86   Temp 99.8 F (37.7 C) (Oral)   Resp (!) 28   Ht 5\' 6"  (1.676 m)   Wt 74.8 kg   SpO2 96%   BMI 26.63 kg/m   Physical Exam  Constitutional: She appears well-developed and well-nourished. No distress.  HENT:  Head: Normocephalic and atraumatic.  Mouth/Throat: Oropharynx is clear and moist. No oropharyngeal exudate.  Eyes: Conjunctivae are normal. Pupils are equal, round, and reactive to light. Right eye exhibits no discharge. Left eye exhibits no discharge. No scleral icterus.  Neck: Normal range of motion. Neck supple. No thyromegaly present.  Cardiovascular: Normal rate, regular rhythm and intact distal pulses.  Exam reveals no gallop and no friction rub.   Murmur heard. Pulmonary/Chest: Effort normal and breath sounds normal. No stridor. No respiratory distress. She has no wheezes. She has no rales.    Abdominal: Soft. Bowel sounds are normal. She exhibits no distension. There is no tenderness. There is no rebound and no guarding.  Musculoskeletal:  She exhibits no edema.  Lymphadenopathy:    She has no cervical adenopathy.  Neurological: She is alert. Coordination normal.  Skin: Skin is warm and dry. No rash noted. She is not diaphoretic. No pallor.  Psychiatric: She has a normal mood and affect.  Nursing note and vitals reviewed.    ED Treatments / Results  Labs (all labs ordered are listed, but only abnormal results are displayed) Labs Reviewed  BASIC METABOLIC PANEL - Abnormal; Notable for the following:       Result Value   Potassium 3.4 (*)    Chloride 99 (*)    Glucose, Bld 190 (*)    BUN 24 (*)    Creatinine, Ser 2.77 (*)    GFR calc non Af Amer 15 (*)    GFR calc Af Amer 17 (*)    All other components within normal limits  CBC - Abnormal; Notable for the following:    WBC 13.9 (*)    RBC 3.63 (*)    Hemoglobin 10.9 (*)    HCT 34.5 (*)    All other components within normal limits  D-DIMER, QUANTITATIVE (NOT AT [facility]) - Abnormal; Notable for the following:    D-Dimer, Quant 1.04 (*)    All other components within normal limits  I-STAT TROPOININ, ED - Abnormal; Notable for the following:    Troponin i, poc 0.19 (*)    All other components within normal limits  I-STAT TROPOININ, ED - Abnormal; Notable for the following:    Troponin i, poc 0.14 (*)    All other components within normal limits  HEPATITIS B SURFACE ANTIGEN    EKG  EKG Interpretation  Date/Time:  Saturday February 17 2016 13:02:55 EDT Ventricular Rate:  82 PR Interval:  180 QRS Duration: 115 QT Interval:  427 QTC Calculation: 499 R Axis:   170 Text Interpretation:  Sinus rhythm Ventricular premature complex Nonspecific intraventricular conduction delay Probable lateral infarct, age indeterminate No significant change since last tracing Confirmed by Winfred Leeds  MD, SAM [phone]) on 02/17/2016 1:06:48 PM       Radiology Dg Chest 2 View  Result Date: 02/17/2016 CLINICAL DATA:  Pt reports intermittent sharp right upper chest pain, cough, and  SOB onset 1200 AM last night; she reports pain is near her dialysis catheter; she reports h/o DM, HTN, and multiple MI's; non-smoker EXAM: CHEST  2 VIEW COMPARISON:  10/17/2015 FINDINGS: Right-sided dialysis catheter tip overlies the level of superior vena cava. The heart is enlarged. There is mild pulmonary vascular congestion but no overt alveolar edema. There are no focal consolidations. Low lung volumes. Elevation of the right hemidiaphragm is stable. IMPRESSION: 1. Cardiomegaly without overt edema. 2. Low lung volumes. Electronically Signed   By: Nolon Nations M.D.   On: 02/17/2016 09:49   Nm Pulmonary Perf And Vent  Result Date: 02/17/2016 CLINICAL DATA:  Shortness of breath, elevated D-dimer, hypertension, diabetes mellitus, COPD, chronic kidney disease, CHF, coronary artery disease, cardiomyopathy, asthma EXAM: NUCLEAR MEDICINE VENTILATION - PERFUSION LUNG SCAN TECHNIQUE: Ventilation images were obtained in multiple projections using inhaled aerosol Tc-89m DTPA. Perfusion images were obtained in multiple projections after intravenous injection of Tc-2m MAA. RADIOPHARMACEUTICALS:  32.3 mCi Technetium-61m DTPA aerosol inhalation and 4.0 weight mCi Technetium-58m MAA IV COMPARISON:  None Correlation: Chest radiograph 02/17/2016 FINDINGS: Ventilation: Normal Perfusion: Normal IMPRESSION: Normal ventilation and perfusion lung scan. Electronically Signed   By: Lavonia Dana M.D.   On: 02/17/2016 20:20    Procedures Procedures (including critical care time)  Medications Ordered in ED Medications  morphine 2 MG/ML injection 2 mg (2 mg Intravenous Given 02/17/16 1046)  ferric gluconate (NULECIT) 125 mg in sodium chloride 0.9 % 100 mL IVPB (125 mg Intravenous Given 02/17/16 2016)  morphine 2 MG/ML injection 2 mg (2 mg Intravenous Given 02/17/16 1240)  technetium albumin aggregated (MAA) injection solution 4 millicurie (4 millicuries Intravenous Contrast Given 02/17/16 1939)  0.9 %  sodium chloride infusion (  Intravenous Stopped 02/17/16 2043)  acetaminophen (TYLENOL) tablet 1,000 mg (1,000 mg Oral Given 02/17/16 2155)  fentaNYL (SUBLIMAZE) injection 25 mcg (25 mcg Intravenous Given 02/17/16 2155)     Initial Impression / Assessment and Plan / ED Course  I have reviewed the triage vital signs and the nursing notes.  Pertinent labs & imaging results that were available during my care of the patient were reviewed by me and considered in my medical decision making (see chart for details).  Clinical Course    12:34pm On reevaluation in getting patient update on VQ scan, patient states that her pain has moved to her left side and is a sharp pain shooting across to her right side. Patient jumped when the pain hit her. Patient states that the morphine helped a little bit given to her earlier. I will order morphine 2 mg again.  Patient with chest pain over the catheter site. Patient with chronically elevated troponin. Delta troponin decreased 0.05. Nonacute EKG. BMP shows potassium 3.4, chloride 99, glucose 190, purulent 24, creatinine 2.77. CBC shows 13.9, stable chronic anemia, hemoglobin 10.9. D-dimer 1.04. CXR shows cardiomegaly without overt edema. Patient's pain moderately controlled with morphine in the ED. No signs of infection at catheter  site. Patient also evaluated by Dr. Cathleen Fears who guided the patient's management and agrees with plan. Dr. Cathleen Fears spoke with nephrology who arrange dialysis patient was in the hospital. VQ scan pending with signed out to Dr. Leonette Monarch at shift change. Patient to follow-up with primary care provider and nephrologist for recommended removal of catheter. Patient vitals stable throughout ED course.  Final Clinical Impressions(s) / ED Diagnoses   Final diagnoses:  Non-cardiac chest pain    New Prescriptions Discharge Medication List as of 02/17/2016  8:28 PM       Frederica Kuster, PA-C 02/18/16 [place], MD 02/18/16 [phone]

## 2016-02-17 NOTE — ED Notes (Signed)
Dr Leonette Monarch in w/pt and family. Paging IV team for assistance w/hemodialysis dressing.

## 2016-02-17 NOTE — ED Triage Notes (Signed)
Patient complains of central chest pain since midnight. States that the pain is located around her dialysis catheter, slightly tender to touch. Patient was due for dialysis today. No associated symptoms with the pain

## 2016-02-17 NOTE — ED Notes (Signed)
IV attempt x 2 by this RN. 

## 2016-02-17 NOTE — ED Notes (Signed)
IV team RN aware pt needs dressing to site.

## 2016-02-17 NOTE — ED Provider Notes (Addendum)
Complains of right-sided anterior chest pain at site of dialysis catheter insertion site onset last night pain is worse with deep inspiration. Other associated symptoms include shortness of breath which has been ongoing for 6 months however worsen since last night. With elevated d-dimer, and complained of shortness of breath ventilation/perfusion scan ordered. Patient's troponin is elevated at baseline. Symptoms highly inconsistent with acute coronary syndrome, nonacute EKG   Orlie Dakin, MD 02/17/16 [place], MD 02/17/16 1137 Dr.Upton was consulted. Patient had hemodialysis performed here. She'll be returned to the emergency department following hemodialysis to get VQ scan check for pulmonary embolism. Patient is signed out to [facility] at [place] pm   Orlie Dakin, MD 02/17/16 [phone]

## 2016-02-17 NOTE — ED Notes (Signed)
X-ray tech advised she will call [facility] tech to advise pt on her way back to ED.

## 2016-02-17 NOTE — ED Notes (Addendum)
Per pt request, pt given food and water. Pt still reports pain with movement and requests more pain medication at this time. RN made aware.

## 2016-02-17 NOTE — ED Notes (Signed)
Monitor intact to pt. Family at bedside. Pt states chest pain continues. Aware EDP will be in to assess soon.

## 2016-02-18 LAB — HEPATITIS B SURFACE ANTIGEN: HEP B S AG: NEGATIVE

## 2016-02-22 ENCOUNTER — Other Ambulatory Visit: Payer: Self-pay | Admitting: Physician Assistant

## 2016-02-28 ENCOUNTER — Encounter: Payer: Self-pay | Admitting: Vascular Surgery

## 2016-02-28 ENCOUNTER — Ambulatory Visit (INDEPENDENT_AMBULATORY_CARE_PROVIDER_SITE_OTHER): Payer: Medicare Other | Admitting: Vascular Surgery

## 2016-02-28 ENCOUNTER — Ambulatory Visit: Payer: Medicare Other | Admitting: Vascular Surgery

## 2016-02-28 VITALS — BP 119/58 | HR 66 | Temp 101.5°F | Resp 20 | Ht 66.0 in | Wt 165.0 lb

## 2016-02-28 DIAGNOSIS — I739 Peripheral vascular disease, unspecified: Secondary | ICD-10-CM | POA: Diagnosis not present

## 2016-02-28 NOTE — Progress Notes (Signed)
Patient name: [name] MRN: [id number] DOB: Mar 16, 1930 Sex: female  REASON FOR VISIT: Left below the knee prosthesis is not fitting well.  HPI: [name] is a 82 y.o. female This had a remote history of a left below the knee amputation. Her prosthesis has not been fitting well and she was sent over by [employer] to be reevaluated.  She has not been ablating much with her prosthesis because it is not fitting well despite using multiple socks. She denies symptoms in her right leg. She denies any wounds.  Current Outpatient Prescriptions  Medication Sig Dispense Refill  . acetaminophen (TYLENOL) 500 MG tablet Take 500 mg by mouth every 6 (six) hours as needed for mild pain.    Marland Kitchen albuterol (PROVENTIL HFA;VENTOLIN HFA) 108 (90 BASE) MCG/ACT inhaler Inhale 2 puffs into the lungs every 4 (four) hours as needed for wheezing or shortness of breath. 1 Inhaler 3  . allopurinol (ZYLOPRIM) 100 MG tablet Take 1 tablet (100 mg total) by mouth daily. 90 tablet 3  . amLODipine (NORVASC) 5 MG tablet Take 1 tablet (5 mg total) by mouth daily. 90 tablet 3  . aspirin EC 81 MG tablet Take 1 tablet (81 mg total) by mouth daily. (Patient taking differently: Take 81 mg by mouth every morning. ) 90 tablet 3  . atorvastatin (LIPITOR) 40 MG tablet TAKE 1 TABLET BY MOUTH DAILY AT 6 PM 90 tablet 0  . clopidogrel (PLAVIX) 75 MG tablet TAKE 1 TABLET BY MOUTH DAILY 90 tablet 0  . colestipol (COLESTID) 1 g tablet Take 1 g by mouth 3 (three) times daily.    Marland Kitchen donepezil (ARICEPT) 5 MG tablet Take 1 tablet (5 mg total) by mouth at bedtime. 90 tablet 3  . ezetimibe (ZETIA) 10 MG tablet TAKE 1 TABLET BY MOUTH EVERY MORNING 90 tablet 0  . feeding supplement (GLUCERNA SHAKE) LIQD Take 237 mLs by mouth every morning.     . Fluticasone-Salmeterol (ADVAIR DISKUS) 100-50 MCG/DOSE AEPB Inhale 1 puff into the lungs 2 (two) times daily. 1 each 11  . gabapentin (NEURONTIN) 100 MG capsule TAKE 1 CAPSULE BY MOUTH EVERY  NIGHT AT BEDTIME 90 capsule 3  . hydrALAZINE (APRESOLINE) 25 MG tablet Take 1 tablet (25 mg total) by mouth 3 (three) times daily. 270 tablet 3  . insulin NPH-regular Human (NOVOLIN 70/30) (70-30) 100 UNIT/ML injection Inject 8 Units into the skin daily with breakfast. 10 mL 11  . isosorbide mononitrate (IMDUR) 60 MG 24 hr tablet TAKE 1 TABLET BY MOUTH DAILY 90 tablet 2  . latanoprost (XALATAN) 0.005 % ophthalmic solution Place 1 drop into both eyes at bedtime.    . memantine (NAMENDA) 5 MG tablet Take 1 tablet (5 mg total) by mouth 2 (two) times daily. 60 tablet 11  . nitroGLYCERIN (NITROSTAT) 0.4 MG SL tablet Place 0.4 mg under the tongue every 5 (five) minutes as needed for chest pain.     . ONE TOUCH ULTRA TEST test strip USE TO TEST BLOOD SUGAR TWICE DAILY 100 each 2  . pantoprazole (PROTONIX) 40 MG tablet Take 1 tablet (40 mg total) by mouth 2 (two) times daily. 180 tablet 2  . RANEXA 500 MG 12 hr tablet TAKE 1 TABLET BY MOUTH EVERY 12 HOURS 60 tablet 1  . sertraline (ZOLOFT) 100 MG tablet Take 1 tablet (100 mg total) by mouth daily. 90 tablet 2  . RANEXA 500 MG 12 hr tablet TAKE 1 TABLET BY MOUTH TWICE DAILY (Patient not  taking: Reported on 02/28/2016) 60 tablet 11   No current facility-administered medications for this visit.     REVIEW OF SYSTEMS:  [X]  denotes positive finding, [ ]  denotes negative finding Cardiac  Comments:  Chest pain or chest pressure:    Shortness of breath upon exertion:    Short of breath when lying flat:    Irregular heart rhythm:    Constitutional    Fever or chills:      PHYSICAL EXAM: Vitals:   02/28/16 1340  BP: (!) 119/58  Pulse: 66  Resp: 20  Temp: (!) 101.5 F (38.6 C)  TempSrc: Oral  SpO2: 92%  Weight: 165 lb (74.8 kg)  Height: 5\' 6"  (1.676 m)    GENERAL: The patient is a well-nourished female, in no acute distress. The vital signs are documented above. CARDIOVASCULAR: There is a regular rate and rhythm. PULMONARY: There is good air  exchange bilaterally without wheezing or rales. Her right foot is warm and well-perfused. There are no ulcers.  MEDICAL ISSUES:  STATUS POST LEFT BELOW THE KNEE AMPUTATION: I have written her a prescription for a new socket for her left below the knee prosthesis. She will keep her regularly scheduled appointment as were following her peripheral vascular disease on the right.  Deitra Mayo [facility] [phone]

## 2016-03-01 ENCOUNTER — Other Ambulatory Visit: Payer: Self-pay | Admitting: Internal Medicine

## 2016-03-07 ENCOUNTER — Encounter: Payer: Self-pay | Admitting: Cardiology

## 2016-03-08 ENCOUNTER — Encounter: Payer: Self-pay | Admitting: Cardiology

## 2016-03-08 ENCOUNTER — Telehealth: Payer: Self-pay | Admitting: *Deleted

## 2016-03-08 ENCOUNTER — Other Ambulatory Visit: Payer: Self-pay

## 2016-03-08 ENCOUNTER — Ambulatory Visit (HOSPITAL_COMMUNITY)
Admission: RE | Admit: 2016-03-08 | Discharge: 2016-03-08 | Disposition: A | Discharge status: Home / Self Care | Payer: Medicare Other | Source: Ambulatory Visit | Attending: Cardiology | Admitting: Cardiology

## 2016-03-08 ENCOUNTER — Ambulatory Visit (INDEPENDENT_AMBULATORY_CARE_PROVIDER_SITE_OTHER): Payer: Medicare Other | Admitting: Cardiology

## 2016-03-08 ENCOUNTER — Encounter (HOSPITAL_COMMUNITY)
Admission: RE | Admit: 2016-03-08 | Discharge: 2016-03-08 | Disposition: A | Discharge status: Home / Self Care | Payer: Medicare Other | Source: Ambulatory Visit | Attending: Cardiology | Admitting: Cardiology

## 2016-03-08 VITALS — BP 118/40 | HR 74 | Ht 66.0 in | Wt 168.0 lb

## 2016-03-08 DIAGNOSIS — I2699 Other pulmonary embolism without acute cor pulmonale: Secondary | ICD-10-CM

## 2016-03-08 DIAGNOSIS — R071 Chest pain on breathing: Secondary | ICD-10-CM | POA: Diagnosis not present

## 2016-03-08 DIAGNOSIS — R001 Bradycardia, unspecified: Secondary | ICD-10-CM

## 2016-03-08 DIAGNOSIS — I2583 Coronary atherosclerosis due to lipid rich plaque: Secondary | ICD-10-CM

## 2016-03-08 DIAGNOSIS — R079 Chest pain, unspecified: Secondary | ICD-10-CM

## 2016-03-08 DIAGNOSIS — R0602 Shortness of breath: Secondary | ICD-10-CM | POA: Insufficient documentation

## 2016-03-08 DIAGNOSIS — I110 Hypertensive heart disease with heart failure: Secondary | ICD-10-CM

## 2016-03-08 DIAGNOSIS — R072 Precordial pain: Secondary | ICD-10-CM

## 2016-03-08 DIAGNOSIS — I2510 Atherosclerotic heart disease of native coronary artery without angina pectoris: Secondary | ICD-10-CM

## 2016-03-08 DIAGNOSIS — N183 Chronic kidney disease, stage 3 unspecified: Secondary | ICD-10-CM

## 2016-03-08 DIAGNOSIS — E785 Hyperlipidemia, unspecified: Secondary | ICD-10-CM

## 2016-03-08 DIAGNOSIS — I739 Peripheral vascular disease, unspecified: Secondary | ICD-10-CM

## 2016-03-08 MED ORDER — TECHNETIUM TC 99M DIETHYLENETRIAME-PENTAACETIC ACID: 30.0000 | Freq: Once | INTRAVENOUS | Status: DC | PRN

## 2016-03-08 MED ORDER — TECHNETIUM TO 99M ALBUMIN AGGREGATED
4.0000 | Freq: Once | INTRAVENOUS | Status: AC | PRN
  Administered 2016-03-08: 4 via INTRAVENOUS

## 2016-03-08 NOTE — Telephone Encounter (Signed)
Notified the pt and spouse that per Dr Meda Coffee, her chest x-ray was normal and no pulmonary embolism noted on V-Q Scan.  Did inform both parties that per Dr Meda Coffee, she recommends that the pt to be scheduled for a stress test for next week.  Went over stress test instructions with the pt and spouse over the phone.  Advised the pt that she needs to hold her morning dose of insulin the day of her stress test.  Per the pts spouse, he states he will stop by the office today or Monday and pick up the pts instruction form for her [place].  Spouse also request for samples of Ranexa 500 mg to be left at the front desk with her instruction letter, if available.  Informed the pts spouse that I will place samples up there if available. Informed both parties that [facility] scheduling will be calling them shortly to have the pts lexiscan scheduled. Both verbalized understanding and agrees with this plan.

## 2016-03-08 NOTE — Telephone Encounter (Signed)
Per [facility] scheduling, the ptsMYOVIEW is scheduled for ---03/13/16.  Pt and spouse aware of appt date and time.

## 2016-03-08 NOTE — Progress Notes (Signed)
Patient ID: [name], female   DOB: 1929-12-12, 82 y.o.   MRN: [id number]      Cardiology Office Note Date:  03/08/2016  Patient ID:  [name], [name] Oct 27, 1929, MRN [id number] PCP:  Renato Shin, MD  Cardiologist:  Dr. Meda Coffee   Chief Complaint: TOC visit posthospitalization 4 days.  History of Present Illness:  [name] is a 82 y.o. female with history of chronic combined CHF (EF 45-50% by [facility] 02/2015), CAD (NSTEMI 06/2014 with LHC showing moderate disease treated medically, recent cath 02/2015 with stable anatomy), DM, HTN, HLD, OSA (on CPAP), PVD s/p L BKA 08/2012, CKD stage III, COPD, bradycardia (not on BB due to this) who presents for post-hospital follow-up. Last echo 06/2014: EF 50-55%, no rWMA, grade 3 DD, mildly dilated LA.  She was admitted 9/19-9/22/16 with chest pain, SOB, left arm pain and right leg pain. She was admitted for [place] and had mild troponin elevation to 0.19. Cath 03/07/15 showed stable coronary angiography and elevated LVEDP - elevated troponin was felt more likely supply/demand in setting of CHF. Bidil was added in place of hydralazine, along with Ranexa. She also received IV Lasix post-cath. She had AKI on CKD with elevation of Cr to 1.83 and was kept in the hospital for further monitoring. At discharge her Cr was 1.94.   She comes in to clinic today doing well. She is accompanied by her husband. She was hospitalized in Npovember with acute on chronic CKD and started on HD. She is currently having it done via perm cath, s/p fistula surgery, not mature yet. Her weight 183 lbs, she has PND about 1x/week, some LE edema, no DOE or CP. No bleeding, no palpitations or syncope.  10/23/2015 - the patient was hospitalized on May 1 with acute chest pain after hemodialysis. She has mild troponin elevation with flat friend that was attributed to her CK D stage IV. She now feels significantly better, no fluids are managed to hemodialysis. She has maturing  fistula, in the meantime she is using dialysis catheter on her chest. She denies any chest pain or shortness of breath nor orthopnea or dyspnea, no edema in her right lower extremity, left his updated above the knee.  02/02/2016 - the patient is coming after 3 months, she feels very well, denies interest pain shortness of breath at rest. She has a bony amputation on her left leg and is a wheelchair-bound. She has been having more fluid removed during hemodialysis currently down from 180 pounds to 165 pounds. He denies any orthopnea or paroxysmal dyspnea. She's been compliant to her management. She started to feel some dizziness especially after dialysis.  03/06/2016, the patient went to the car on 02/17/2016 she was found to have infected dialysis catheter date was removed. Ever since then she has been feeling chest pain that is on deep inspiration but also feels like pressure radiating to her back. She also has noticed worsening shortness of breath. She denies any palpitations or syncope. She's been experiencing chest pain every day.   Past Medical History:  Diagnosis Date  . Arthritis   . Asthma    Mild  . Bradycardia    a. Not on BB due to this.  Marland Kitchen CAD (coronary artery disease)    a. s/p multiple caths with nonobs CAD;   b. cath 1/10: pLAD 20%, mLAD 40%, pCFX 20%, mCFX 40%, pRCA 60-70%;   c.  Myoview 06/02/12: Low anterior wall scar, no ischemia, EF 37%. d. cath  06/20/2014 70% mid LCx, medical therapy, high risk for PCI due to close proximity to large OM e. cath 03/07/15 pro RCA 50% & pro to mid Cx 75%, both stable, LVEDP 33-47mm Hg-> medical management  . Cardiomyopathy ([place])    a. Echo 05/31/12: EF 40-45%. b. Echo 06/2014: EF 50-55%. c. Cath 02/2015: EF 45-50%.  . Chronic combined systolic and diastolic CHF (congestive heart failure) (HCC)    a. EF 50-55%, mild LVH and grade 1 diast. Dysfxn b. Grade 3 Diastolic Dysfunction [id number]. b. 02/2015: EF 45-50% by cath.  . CKD (chronic kidney disease),  stage III    Dr. Lorrene Reid,    dialyzes Tuesday, Thursday, Saturday  . COPD (chronic obstructive pulmonary disease) ([place])   . Depression   . Diabetes mellitus    type II; peripheral neuropathy  . Diverticulosis   . DM retinopathy ([place])   . Dyslipidemia   . GERD (gastroesophageal reflux disease)   . Hiatal hernia   . History of shingles   . Hypertension   . Noncompliance   . Obesity   . Osteoarthritis cervical spine   . Peptic stricture of esophagus   . Peptic ulcer disease    duodenal  . Peripheral vascular disease ([place])    a. s/p L BKA 08/2012.  Marland Kitchen Pruritic condition    Idiopathic  . Shortness of breath dyspnea   . Sleep apnea    with CPAP  . Uterine cancer ([place])   . Zoster     Past Surgical History:  Procedure Laterality Date  . ABDOMINAL ANGIOGRAM N/A 08/31/2012   Procedure: ABDOMINAL ANGIOGRAM with runoff poss intervention;  Surgeon: Angelia Mould, MD;  Location: [facility] CATH LAB;  Service: Cardiovascular;  Laterality: N/A;  . AMPUTATION Left 09/04/2012   Procedure: AMPUTATION BELOW KNEE;  Surgeon: Angelia Mould, MD;  Location: [place];  Service: Vascular;  Laterality: Left;  . AV FISTULA PLACEMENT Left 05/10/2015   Procedure: CREATION OF LEFT UPPER ARM ARTERIOVENOUS (AV) FISTULA ;  Surgeon: Elam Dutch, MD;  Location: [place];  Service: Vascular;  Laterality: Left;  . BRAIN SURGERY     1997 blood clot on the brain, then had to relieve fluid on the brain  . CARDIAC CATHETERIZATION    . CARDIAC CATHETERIZATION N/A 03/07/2015   Procedure: Left Heart Cath and Coronary Angiography;  Surgeon: Leonie Man, MD;  Location: [place] CV LAB;  Service: Cardiovascular;  Laterality: N/A;  . CATARACT EXTRACTION, BILATERAL  2005  . CHOLECYSTECTOMY  2010  . CRANIOTOMY  1997   Left for SDH  . DEXA  7/05  . ESOPHAGOGASTRODUODENOSCOPY  04/04/2004  . EYE SURGERY    . HERNIA REPAIR    . KNEE ARTHROSCOPY  10/1998   Left  . LEFT HEART CATHETERIZATION WITH CORONARY  ANGIOGRAM N/A 06/22/2014   Procedure: LEFT HEART CATHETERIZATION WITH CORONARY ANGIOGRAM;  Surgeon: Burnell Blanks, MD;  Location: [facility] CATH LAB;  Service: Cardiovascular;  Laterality: N/A;  . MULTIPLE EXTRACTIONS WITH ALVEOLOPLASTY N/A 08/01/2014   Procedure: EXTRACTIONS OF TEETH NUMBERS 7 8 9 10 11  AND 19 AND ALVEOLOPLASTY UPPER LEFT AND RIGHT QUADRANT;  Surgeon: Isac Caddy, DDS;  Location: [place];  Service: Oral Surgery;  Laterality: N/A;  . SPINE SURGERY     C-spine and lumbar surgery  . TUBAL LIGATION  1967  . TUBAL LIGATION      Current Outpatient Prescriptions  Medication Sig Dispense Refill  . acetaminophen (TYLENOL) 500 MG tablet Take 500 mg  by mouth every 6 (six) hours as needed for mild pain.    Marland Kitchen albuterol (PROVENTIL HFA;VENTOLIN HFA) 108 (90 BASE) MCG/ACT inhaler Inhale 2 puffs into the lungs every 4 (four) hours as needed for wheezing or shortness of breath. 1 Inhaler 3  . allopurinol (ZYLOPRIM) 100 MG tablet Take 1 tablet (100 mg total) by mouth daily. 90 tablet 3  . amLODipine (NORVASC) 5 MG tablet Take 1 tablet (5 mg total) by mouth daily. 90 tablet 3  . aspirin EC 81 MG tablet Take 1 tablet (81 mg total) by mouth daily. 90 tablet 3  . atorvastatin (LIPITOR) 40 MG tablet TAKE 1 TABLET BY MOUTH DAILY AT 6 PM 90 tablet 0  . clopidogrel (PLAVIX) 75 MG tablet TAKE 1 TABLET BY MOUTH DAILY 90 tablet 0  . colestipol (COLESTID) 1 g tablet Take 1 g by mouth 3 (three) times daily.    Marland Kitchen donepezil (ARICEPT) 5 MG tablet Take 1 tablet (5 mg total) by mouth at bedtime. 90 tablet 3  . ezetimibe (ZETIA) 10 MG tablet TAKE 1 TABLET BY MOUTH EVERY MORNING 90 tablet 0  . feeding supplement (GLUCERNA SHAKE) LIQD Take 237 mLs by mouth every morning.     . Fluticasone-Salmeterol (ADVAIR DISKUS) 100-50 MCG/DOSE AEPB Inhale 1 puff into the lungs 2 (two) times daily. 1 each 11  . gabapentin (NEURONTIN) 100 MG capsule TAKE 1 CAPSULE BY MOUTH EVERY NIGHT AT BEDTIME 90 capsule 3  .  hydrALAZINE (APRESOLINE) 25 MG tablet Take 1 tablet (25 mg total) by mouth 3 (three) times daily. 270 tablet 3  . insulin NPH-regular Human (NOVOLIN 70/30) (70-30) 100 UNIT/ML injection Inject 8 Units into the skin daily with breakfast. 10 mL 11  . isosorbide mononitrate (IMDUR) 60 MG 24 hr tablet TAKE 1 TABLET BY MOUTH DAILY 90 tablet 2  . latanoprost (XALATAN) 0.005 % ophthalmic solution Place 1 drop into both eyes at bedtime.    . memantine (NAMENDA) 5 MG tablet Take 1 tablet (5 mg total) by mouth 2 (two) times daily. 60 tablet 11  . nitroGLYCERIN (NITROSTAT) 0.4 MG SL tablet Place 0.4 mg under the tongue every 5 (five) minutes as needed for chest pain.     . ONE TOUCH ULTRA TEST test strip USE TO TEST BLOOD SUGAR TWICE DAILY 100 each 2  . pantoprazole (PROTONIX) 40 MG tablet Take 1 tablet (40 mg total) by mouth 2 (two) times daily. 180 tablet 2  . RANEXA 500 MG 12 hr tablet TAKE 1 TABLET BY MOUTH EVERY 12 HOURS 60 tablet 1  . sertraline (ZOLOFT) 100 MG tablet Take 1 tablet (100 mg total) by mouth daily. 90 tablet 2   No current facility-administered medications for this visit.    Allergies:   Iohexol   Social History:  The patient  reports that she has never smoked. She has never used smokeless tobacco. She reports that she does not drink alcohol or use drugs.   Family History:  The patient's family history includes Asthma in her other; Cancer - Other in her mother; Diabetes in her mother and sister; Heart disease in her mother; Hypertension in her father and mother; Kidney disease in her paternal grandmother; Lymphoma in her father; Rheum arthritis in her father and mother; Stomach cancer in her mother; Stroke in her paternal grandmother.*  ROS:  Please see the history of present illness.    All other systems are reviewed and otherwise negative.   PHYSICAL EXAM:  VS:  BP (!) 118/40  Pulse 74   Ht 5\' 6"  (1.676 m)   Wt 168 lb (76.2 kg)   SpO2 96%   BMI 27.12 kg/m  BMI: Body mass  index is 27.12 kg/m. Well nourished obese AAF, in no acute distress HEENT: normocephalic, atraumatic Neck: no JVD, carotid bruits or masses Cardiac:  normal S1, S2; RRR; no murmurs, rubs, or gallops Lungs:  clear to auscultation bilaterally, no wheezing, rhonchi or rales Abd: soft, nontender, no hepatomegaly, + BS MS: no deformity or atrophy Ext: no edema on the right, prosthesis on the left, right radial cath site with resolving ecchymosis, no hematoma, good pulse Skin: warm and dry, no rash Neuro:  moves all extremities spontaneously, no focal abnormalities noted, follows commands Psych: euthymic mood, full affect  EKG:  03/06/2016, normal sinus rhythm with nonspecific ST T-wave abnormalities unchanged from prior EKG.  Recent Labs: 04/24/2015: B Natriuretic Peptide 1,594.7 04/25/2015: Magnesium 1.9 08/17/2015: ALT 19 02/17/2016: BUN 24; Creatinine, Ser 2.77; Hemoglobin 10.9; Platelets 247; Potassium 3.4; Sodium 138  No results found for requested labs within last 8760 hours.   Estimated Creatinine Clearance: 15.8 mL/min (by C-G formula based on SCr of 2.77 mg/dL (H)).   Wt Readings from Last 3 Encounters:  03/08/16 168 lb (76.2 kg)  02/28/16 165 lb (74.8 kg)  02/17/16 165 lb (74.8 kg)    Other studies reviewed: Additional studies/records reviewed today include: summarized above   ASSESSMENT AND PLAN:  1. Chest pain - suspicious for acute pulmonary embolism especially considering that it started after removal of infected dialysis catheter. We'll send for VQ scan considering that she is allergic to iodine. VQ scan results came back negative for pulmonary embolism. We'll schedule a stress test for next week. It will be a Lexiscan nuclear stress test. 2. CAD, with most recent admission in May 2017 for Elevated troponin attributed to CKD stage IV, now with more significant fluid removal she is asymptomatic and appears euvolemic. Continue current management. 3. Acute on chronic combined  CHF - now euvolemic down to 168 pounds from 180 lbs, fluid management hemodialysis. CKD stage V - ESRD on HD. 4. Essential HTN - now rather hypotension after hemodialysis, I would decrease amlodipine to 5 mg daily.. 5. H/o sinus bradycardia - stable. Not on BB due to this.  Follow-up in 2-3 weeks.  Romeo Rabon  03/08/2016 8:39 AM     [address] [place] [address] [phone] (office)  [phone] (fax)

## 2016-03-08 NOTE — Patient Instructions (Addendum)
Medication Instructions:   Your physician recommends that you continue on your current medications as directed. Please refer to the Current Medication list given to you today.    Testing/Procedures:  TO [place] TO HAVE A CHEST X RAY AND A V-Q SCAN DONE TO RULE OUT A PE    Follow-Up:  3 WEEKS WITH DR NELSON--PUT PT ON 04/01/16 END SLOT PER DR NELSON       If you need a refill on your cardiac medications before your next appointment, please call your pharmacy.

## 2016-03-08 NOTE — Telephone Encounter (Signed)
-----   Message from Dorothy Spark, MD sent at 03/08/2016 12:42 PM EDT ----- Normal chest X ray and no pulmonary embolism.  Plaesae schedule a stress test for the next  Week.

## 2016-03-12 ENCOUNTER — Other Ambulatory Visit: Payer: Self-pay | Admitting: Endocrinology

## 2016-03-12 ENCOUNTER — Telehealth (HOSPITAL_COMMUNITY): Payer: Self-pay | Admitting: *Deleted

## 2016-03-12 NOTE — Telephone Encounter (Signed)
Left message on voicemail per DPR in reference to upcoming appointment scheduled on 03/13/16 with detailed instructions given per Myocardial Perfusion Study Information Sheet for the test. LM to arrive 15 minutes early, and that it is imperative to arrive on time for appointment to keep from having the test rescheduled. If you need to cancel or reschedule your appointment, please call the office within 24 hours of your appointment. Failure to do so may result in a cancellation of your appointment, and a $50 no show fee. Phone number given for call back for any questions. Kirstie Peri

## 2016-03-13 ENCOUNTER — Ambulatory Visit (HOSPITAL_COMMUNITY): Payer: Medicare Other | Attending: Cardiovascular Disease

## 2016-03-13 DIAGNOSIS — I2510 Atherosclerotic heart disease of native coronary artery without angina pectoris: Secondary | ICD-10-CM | POA: Diagnosis not present

## 2016-03-13 DIAGNOSIS — I2583 Coronary atherosclerosis due to lipid rich plaque: Secondary | ICD-10-CM

## 2016-03-13 DIAGNOSIS — I110 Hypertensive heart disease with heart failure: Secondary | ICD-10-CM | POA: Diagnosis not present

## 2016-03-13 DIAGNOSIS — R079 Chest pain, unspecified: Secondary | ICD-10-CM | POA: Diagnosis not present

## 2016-03-13 DIAGNOSIS — E785 Hyperlipidemia, unspecified: Secondary | ICD-10-CM | POA: Insufficient documentation

## 2016-03-13 LAB — MYOCARDIAL PERFUSION IMAGING
LV dias vol: 120 mL (ref 46–106)
LV sys vol: 74 mL
Peak HR: 90 {beats}/min
RATE: 0.3
Rest HR: 76 {beats}/min
SDS: 2
SRS: 2
SSS: 4
TID: 0.99

## 2016-03-13 MED ORDER — TECHNETIUM TC 99M TETROFOSMIN IV KIT
11.0000 | PACK | Freq: Once | INTRAVENOUS | Status: AC | PRN
Start: 2016-03-13 — End: 2016-03-13
  Administered 2016-03-13: 11 via INTRAVENOUS
  Filled 2016-03-13: qty 11

## 2016-03-13 MED ORDER — TECHNETIUM TC 99M TETROFOSMIN IV KIT
32.7000 | PACK | Freq: Once | INTRAVENOUS | Status: AC | PRN
  Administered 2016-03-13: 32.7 via INTRAVENOUS
  Filled 2016-03-13: qty 33

## 2016-03-13 MED ORDER — REGADENOSON 0.4 MG/5ML IV SOLN
0.4000 mg | Freq: Once | INTRAVENOUS | Status: AC
  Administered 2016-03-13: 0.4 mg via INTRAVENOUS

## 2016-03-18 ENCOUNTER — Encounter: Payer: Self-pay | Admitting: Endocrinology

## 2016-03-18 ENCOUNTER — Ambulatory Visit (INDEPENDENT_AMBULATORY_CARE_PROVIDER_SITE_OTHER): Payer: Medicare Other | Admitting: Endocrinology

## 2016-03-18 VITALS — BP 132/60 | HR 70 | Ht 66.0 in | Wt 164.0 lb

## 2016-03-18 DIAGNOSIS — E1151 Type 2 diabetes mellitus with diabetic peripheral angiopathy without gangrene: Secondary | ICD-10-CM | POA: Diagnosis not present

## 2016-03-18 DIAGNOSIS — E119 Type 2 diabetes mellitus without complications: Principal | ICD-10-CM

## 2016-03-18 DIAGNOSIS — N186 End stage renal disease: Secondary | ICD-10-CM

## 2016-03-18 DIAGNOSIS — I2510 Atherosclerotic heart disease of native coronary artery without angina pectoris: Secondary | ICD-10-CM

## 2016-03-18 DIAGNOSIS — Z794 Long term (current) use of insulin: Secondary | ICD-10-CM | POA: Diagnosis not present

## 2016-03-18 DIAGNOSIS — E1122 Type 2 diabetes mellitus with diabetic chronic kidney disease: Secondary | ICD-10-CM | POA: Diagnosis not present

## 2016-03-18 DIAGNOSIS — Z89512 Acquired absence of left leg below knee: Secondary | ICD-10-CM

## 2016-03-18 DIAGNOSIS — IMO0001 Reserved for inherently not codable concepts without codable children: Secondary | ICD-10-CM

## 2016-03-18 LAB — POCT GLYCOSYLATED HEMOGLOBIN (HGB A1C): Hemoglobin A1C: 6.7

## 2016-03-18 MED ORDER — INSULIN NPH ISOPHANE & REGULAR (70-30) 100 UNIT/ML ~~LOC~~ SUSP: 5.0000 [IU] | Freq: Every day | SUBCUTANEOUS | 11 refills | Status: DC

## 2016-03-18 NOTE — Progress Notes (Signed)
Subjective:    Patient ID: [name], female    DOB: 01/04/1930, 82 y.o.   MRN: [id number]  HPI The state of at least three ongoing medical problems is addressed today, with interval history of each noted here: Pt returns for f/u of diabetes mellitus: DM type: Insulin-requiring type 2 Dx'ed: [id number] Complications: polyneuropathy, ESRD, CAD, and PAD.  Therapy: insulin since 1991 GDM: never DKA: never Severe hypoglycemia: last episode was in 2014.  Pancreatitis: never Other: therapy is limited by pt's request for least expensive insulin, and by her need for a simple regimen; husband administers insulin to her. Interval history: husband says he gives her 8 units QAM.  no cbg record, but states cbg's vary from 85-260.  It is in general higher as the day goes on Past Medical History:  Diagnosis Date  . Arthritis   . Asthma    Mild  . Bradycardia    a. Not on BB due to this.  Marland Kitchen CAD (coronary artery disease)    a. s/p multiple caths with nonobs CAD;   b. cath 1/10: pLAD 20%, mLAD 40%, pCFX 20%, mCFX 40%, pRCA 60-70%;   c.  Myoview 06/02/12: Low anterior wall scar, no ischemia, EF 37%. d. cath 06/20/2014 70% mid LCx, medical therapy, high risk for PCI due to close proximity to large OM e. cath 03/07/15 pro RCA 50% & pro to mid Cx 75%, both stable, LVEDP 33-86mm Hg-> medical management  . Cardiomyopathy ([place])    a. Echo 05/31/12: EF 40-45%. b. Echo 06/2014: EF 50-55%. c. Cath 02/2015: EF 45-50%.  . Chronic combined systolic and diastolic CHF (congestive heart failure) (HCC)    a. EF 50-55%, mild LVH and grade 1 diast. Dysfxn b. Grade 3 Diastolic Dysfunction [id number]. b. 02/2015: EF 45-50% by cath.  . CKD (chronic kidney disease), stage III    Dr. Lorrene Reid,    dialyzes Tuesday, Thursday, Saturday  . COPD (chronic obstructive pulmonary disease) ([place])   . Depression   . Diabetes mellitus    type II; peripheral neuropathy  . Diverticulosis   . DM retinopathy ([place])   . Dyslipidemia   . GERD  (gastroesophageal reflux disease)   . Hiatal hernia   . History of shingles   . Hypertension   . Noncompliance   . Obesity   . Osteoarthritis cervical spine   . Peptic stricture of esophagus   . Peptic ulcer disease    duodenal  . Peripheral vascular disease ([place])    a. s/p L BKA 08/2012.  Marland Kitchen Pruritic condition    Idiopathic  . Shortness of breath dyspnea   . Sleep apnea    with CPAP  . Uterine cancer ([place])   . Zoster     Past Surgical History:  Procedure Laterality Date  . ABDOMINAL ANGIOGRAM N/A 08/31/2012   Procedure: ABDOMINAL ANGIOGRAM with runoff poss intervention;  Surgeon: Angelia Mould, MD;  Location: [facility] CATH LAB;  Service: Cardiovascular;  Laterality: N/A;  . AMPUTATION Left 09/04/2012   Procedure: AMPUTATION BELOW KNEE;  Surgeon: Angelia Mould, MD;  Location: [place];  Service: Vascular;  Laterality: Left;  . AV FISTULA PLACEMENT Left 05/10/2015   Procedure: CREATION OF LEFT UPPER ARM ARTERIOVENOUS (AV) FISTULA ;  Surgeon: Elam Dutch, MD;  Location: [place];  Service: Vascular;  Laterality: Left;  . BRAIN SURGERY     1997 blood clot on the brain, then had to relieve fluid on the brain  . CARDIAC CATHETERIZATION    .  CARDIAC CATHETERIZATION N/A 03/07/2015   Procedure: Left Heart Cath and Coronary Angiography;  Surgeon: Leonie Man, MD;  Location: [place] CV LAB;  Service: Cardiovascular;  Laterality: N/A;  . CATARACT EXTRACTION, BILATERAL  2005  . CHOLECYSTECTOMY  2010  . CRANIOTOMY  1997   Left for SDH  . DEXA  7/05  . ESOPHAGOGASTRODUODENOSCOPY  04/04/2004  . EYE SURGERY    . HERNIA REPAIR    . KNEE ARTHROSCOPY  10/1998   Left  . LEFT HEART CATHETERIZATION WITH CORONARY ANGIOGRAM N/A 06/22/2014   Procedure: LEFT HEART CATHETERIZATION WITH CORONARY ANGIOGRAM;  Surgeon: Burnell Blanks, MD;  Location: [facility] CATH LAB;  Service: Cardiovascular;  Laterality: N/A;  . MULTIPLE EXTRACTIONS WITH ALVEOLOPLASTY N/A 08/01/2014   Procedure:  EXTRACTIONS OF TEETH NUMBERS 7 8 9 10 11  AND 19 AND ALVEOLOPLASTY UPPER LEFT AND RIGHT QUADRANT;  Surgeon: Isac Caddy, DDS;  Location: [place];  Service: Oral Surgery;  Laterality: N/A;  . SPINE SURGERY     C-spine and lumbar surgery  . TUBAL LIGATION  1967  . TUBAL LIGATION      Social History   Social History  . Marital status: Married    Spouse name: N/A  . Number of children: 7  . Years of education: N/A   Occupational History  . Retired Retired   Social History Main Topics  . Smoking status: Never Smoker  . Smokeless tobacco: Never Used  . Alcohol use No     Comment: rare  . Drug use: No  . Sexual activity: No   Other Topics Concern  . Not on file   Social History Narrative  . No narrative on file    Current Outpatient Prescriptions on File Prior to Visit  Medication Sig Dispense Refill  . acetaminophen (TYLENOL) 500 MG tablet Take 500 mg by mouth every 6 (six) hours as needed for mild pain.    Marland Kitchen albuterol (PROVENTIL HFA;VENTOLIN HFA) 108 (90 BASE) MCG/ACT inhaler Inhale 2 puffs into the lungs every 4 (four) hours as needed for wheezing or shortness of breath. 1 Inhaler 3  . allopurinol (ZYLOPRIM) 100 MG tablet Take 1 tablet (100 mg total) by mouth daily. 90 tablet 3  . amLODipine (NORVASC) 5 MG tablet Take 1 tablet (5 mg total) by mouth daily. 90 tablet 3  . aspirin EC 81 MG tablet Take 1 tablet (81 mg total) by mouth daily. 90 tablet 3  . atorvastatin (LIPITOR) 40 MG tablet TAKE 1 TABLET BY MOUTH DAILY AT 6 PM 90 tablet 0  . clopidogrel (PLAVIX) 75 MG tablet TAKE 1 TABLET BY MOUTH DAILY 90 tablet 0  . colestipol (COLESTID) 1 g tablet Take 1 g by mouth 3 (three) times daily.    Marland Kitchen donepezil (ARICEPT) 5 MG tablet Take 1 tablet (5 mg total) by mouth at bedtime. 90 tablet 3  . ezetimibe (ZETIA) 10 MG tablet TAKE 1 TABLET BY MOUTH EVERY MORNING 90 tablet 0  . feeding supplement (GLUCERNA SHAKE) LIQD Take 237 mLs by mouth every morning.     .  Fluticasone-Salmeterol (ADVAIR DISKUS) 100-50 MCG/DOSE AEPB Inhale 1 puff into the lungs 2 (two) times daily. 1 each 11  . gabapentin (NEURONTIN) 100 MG capsule TAKE 1 CAPSULE BY MOUTH EVERY NIGHT AT BEDTIME 90 capsule 3  . hydrALAZINE (APRESOLINE) 25 MG tablet Take 1 tablet (25 mg total) by mouth 3 (three) times daily. 270 tablet 3  . isosorbide mononitrate (IMDUR) 60 MG 24 hr tablet TAKE  1 TABLET BY MOUTH DAILY 90 tablet 2  . latanoprost (XALATAN) 0.005 % ophthalmic solution Place 1 drop into both eyes at bedtime.    . memantine (NAMENDA) 5 MG tablet Take 1 tablet (5 mg total) by mouth 2 (two) times daily. 60 tablet 11  . nitroGLYCERIN (NITROSTAT) 0.4 MG SL tablet Place 0.4 mg under the tongue every 5 (five) minutes as needed for chest pain.     . ONE TOUCH ULTRA TEST test strip USE TO TEST BLOOD SUGAR TWICE DAILY 100 each 2  . pantoprazole (PROTONIX) 40 MG tablet TAKE 1 TABLET BY MOUTH TWICE DAILY 180 tablet 0  . RANEXA 500 MG 12 hr tablet TAKE 1 TABLET BY MOUTH EVERY 12 HOURS 60 tablet 1  . sertraline (ZOLOFT) 100 MG tablet Take 1 tablet (100 mg total) by mouth daily. 90 tablet 2   No current facility-administered medications on file prior to visit.     Allergies  Allergen Reactions  . Iohexol Anaphylaxis, Itching and Rash     Code: RASH, Desc: [place] ON PT'S CHART ALLERGIC TO IV DYE 09/04/07/RM, Onset Date: [id number]     Family History  Problem Relation Age of Onset  . Cancer - Other Mother     "Stomach" Cancer  . Diabetes Mother   . Heart disease Mother   . Stomach cancer Mother   . Hypertension Mother   . Rheum arthritis Mother   . Lymphoma Father   . Hypertension Father   . Rheum arthritis Father   . Kidney disease Paternal Grandmother   . Stroke Paternal Grandmother   . Asthma Other   . Diabetes Sister   . Heart attack Neg Hx     BP 132/60   Pulse 70   Ht 5\' 6"  (1.676 m)   Wt 164 lb (74.4 kg)   SpO2 97%   BMI 26.47 kg/m    Review of Systems He denies  hypoglycemia.     Objective:   Physical Exam VITAL SIGNS:  See vs page.   GENERAL: no distress.  In wheelchair.   LLE: BKA.   Right foot: no deformity.  There is bilateral onychomycosis.  Skin: no ulcer, and normal color and temp, on the right foot.  Neuro: sensation is intact to touch on the right foot.   CV: 1+ right leg edema.   A1c=6.7%    Assessment & Plan:  Insulin-requiring type 2 DM: overcontrolled, given this regimen, which does match insulin to her changing needs throughout the day. CAD: in this setting, she should avoid hypoglycemia ESRD: in this setting, a1c might overestimate glycemic control.  We'll check fructosamine in the future.

## 2016-03-18 NOTE — Patient Instructions (Addendum)
Please reduce the insulin to 5 units each morning, and none in the evening, no matter what your blood sugar is.    check your blood sugar twice a day.  vary the time of day when you check, between before the 3 meals, and at bedtime.  also check if you have symptoms of your blood sugar being too high or too low.  please keep a record of the readings and bring it to your next appointment here (or you can bring the meter itself).  You can write it on any piece of paper.  please call us sooner if your blood sugar goes below 70, or if you have a lot of readings over 200.  Please come back for a follow-up appointment in 2 months.

## 2016-03-20 ENCOUNTER — Other Ambulatory Visit: Payer: Self-pay | Admitting: Endocrinology

## 2016-03-25 ENCOUNTER — Other Ambulatory Visit: Payer: Self-pay | Admitting: Endocrinology

## 2016-04-01 ENCOUNTER — Ambulatory Visit (INDEPENDENT_AMBULATORY_CARE_PROVIDER_SITE_OTHER): Payer: Medicare Other | Admitting: Cardiology

## 2016-04-01 VITALS — BP 124/64 | HR 64 | Ht 66.0 in | Wt 159.0 lb

## 2016-04-01 DIAGNOSIS — I110 Hypertensive heart disease with heart failure: Secondary | ICD-10-CM

## 2016-04-01 DIAGNOSIS — I952 Hypotension due to drugs: Secondary | ICD-10-CM | POA: Diagnosis not present

## 2016-04-01 DIAGNOSIS — E782 Mixed hyperlipidemia: Secondary | ICD-10-CM

## 2016-04-01 DIAGNOSIS — I2510 Atherosclerotic heart disease of native coronary artery without angina pectoris: Secondary | ICD-10-CM

## 2016-04-01 DIAGNOSIS — I2583 Coronary atherosclerosis due to lipid rich plaque: Secondary | ICD-10-CM

## 2016-04-01 MED ORDER — AMLODIPINE BESYLATE 2.5 MG PO TABS: 2.5000 mg | ORAL_TABLET | Freq: Every day | ORAL | 3 refills | Status: DC

## 2016-04-01 NOTE — Patient Instructions (Signed)
Medication Instructions:   DECREASE YOUR AMLODIPINE TO 2.5 MG ONCE DAILY    Follow-Up:  3 MONTHS WITH DR Meda Coffee     If you need a refill on your cardiac medications before your next appointment, please call your pharmacy.

## 2016-04-01 NOTE — Progress Notes (Addendum)
Patient ID: JACEE PASSALAQUA, female   DOB: 1930-04-19, 82 y.o.   MRN: [id number]      Cardiology Office Note Date:  04/01/2016  Patient ID:  [name], [name] [birth year], MRN [id number] PCP:  Renato Shin, MD  Cardiologist:  Dr. Meda Coffee   Chief Complaint: TOC visit posthospitalization 4 days.  History of Present Illness:  [name] is a 82 y.o. female with history of chronic combined CHF (EF 45-50% by [facility] 02/2015), CAD (NSTEMI 06/2014 with LHC showing moderate disease treated medically, recent cath 02/2015 with stable anatomy), DM, HTN, HLD, OSA (on CPAP), PVD s/p L BKA 08/2012, CKD stage III, COPD, bradycardia (not on BB due to this) who presents for post-hospital follow-up. Last echo 06/2014: EF 50-55%, no rWMA, grade 3 DD, mildly dilated LA.  She was admitted 9/19-9/22/16 with chest pain, SOB, left arm pain and right leg pain. She was admitted for [place] and had mild troponin elevation to 0.19. Cath 03/07/15 showed stable coronary angiography and elevated LVEDP - elevated troponin was felt more likely supply/demand in setting of CHF. Bidil was added in place of hydralazine, along with Ranexa. She also received IV Lasix post-cath. She had AKI on CKD with elevation of Cr to 1.83 and was kept in the hospital for further monitoring. At discharge her Cr was 1.94.   She comes in to clinic today doing well. She is accompanied by her husband. She was hospitalized in Npovember with acute on chronic CKD and started on HD. She is currently having it done via perm cath, s/p fistula surgery, not mature yet. Her weight 183 lbs, she has PND about 1x/week, some LE edema, no DOE or CP. No bleeding, no palpitations or syncope.  10/23/2015 - the patient was hospitalized on May 1 with acute chest pain after hemodialysis. She has mild troponin elevation with flat friend that was attributed to her CK D stage IV. She now feels significantly better, no fluids are managed to hemodialysis. She has maturing  fistula, in the meantime she is using dialysis catheter on her chest. She denies any chest pain or shortness of breath nor orthopnea or dyspnea, no edema in her right lower extremity, left his updated above the knee.  02/02/2016 - the patient is coming after 3 months, she feels very well, denies interest pain shortness of breath at rest. She has a bony amputation on her left leg and is a wheelchair-bound. She has been having more fluid removed during hemodialysis currently down from 180 pounds to 165 pounds. He denies any orthopnea or paroxysmal dyspnea. She's been compliant to her management. She started to feel some dizziness especially after dialysis.  03/06/2016, the patient went to the car on 02/17/2016 she was found to have infected dialysis catheter date was removed. Ever since then she has been feeling chest pain that is on deep inspiration but also feels like pressure radiating to her back. She also has noticed worsening shortness of breath. She denies any palpitations or syncope. She's been experiencing chest pain every day.  04/01/16 - 1 month follow up, at the last visit the patient was referred for an urgent chest CT with concern for an acute pulmonary embolism, but it was negative. Today she states that she feels significantly better, she denies chest pain or resting SOB, she is minimally active sec to a prosthetic leg.  She is complaining of orthostatic hypotension, no falls or syncope.    Past Medical History:  Diagnosis Date  . Arthritis   .  Asthma    Mild  . Bradycardia    a. Not on BB due to this.  Marland Kitchen CAD (coronary artery disease)    a. s/p multiple caths with nonobs CAD;   b. cath 1/10: pLAD 20%, mLAD 40%, pCFX 20%, mCFX 40%, pRCA 60-70%;   c.  Myoview 06/02/12: Low anterior wall scar, no ischemia, EF 37%. d. cath 06/20/2014 70% mid LCx, medical therapy, high risk for PCI due to close proximity to large OM e. cath 03/07/15 pro RCA 50% & pro to mid Cx 75%, both stable, LVEDP 33-57mm  Hg-> medical management  . Cardiomyopathy ([place])    a. Echo 05/31/12: EF 40-45%. b. Echo 06/2014: EF 50-55%. c. Cath 02/2015: EF 45-50%.  . Chronic combined systolic and diastolic CHF (congestive heart failure) (HCC)    a. EF 50-55%, mild LVH and grade 1 diast. Dysfxn b. Grade 3 Diastolic Dysfunction [id number]. b. 02/2015: EF 45-50% by cath.  . CKD (chronic kidney disease), stage III    Dr. Lorrene Reid,    dialyzes Tuesday, Thursday, Saturday  . COPD (chronic obstructive pulmonary disease) ([place])   . Depression   . Diabetes mellitus    type II; peripheral neuropathy  . Diverticulosis   . DM retinopathy ([employer])   . Dyslipidemia   . GERD (gastroesophageal reflux disease)   . Hiatal hernia   . History of shingles   . Hypertension   . Noncompliance   . Obesity   . Osteoarthritis cervical spine   . Peptic stricture of esophagus   . Peptic ulcer disease    duodenal  . Peripheral vascular disease ([place])    a. s/p L BKA 08/2012.  Marland Kitchen Pruritic condition    Idiopathic  . Shortness of breath dyspnea   . Sleep apnea    with CPAP  . Uterine cancer ([place])   . Zoster     Past Surgical History:  Procedure Laterality Date  . ABDOMINAL ANGIOGRAM N/A 08/31/2012   Procedure: ABDOMINAL ANGIOGRAM with runoff poss intervention;  Surgeon: Angelia Mould, MD;  Location: [facility] CATH LAB;  Service: Cardiovascular;  Laterality: N/A;  . AMPUTATION Left 09/04/2012   Procedure: AMPUTATION BELOW KNEE;  Surgeon: Angelia Mould, MD;  Location: [place];  Service: Vascular;  Laterality: Left;  . AV FISTULA PLACEMENT Left 05/10/2015   Procedure: CREATION OF LEFT UPPER ARM ARTERIOVENOUS (AV) FISTULA ;  Surgeon: Elam Dutch, MD;  Location: [place];  Service: Vascular;  Laterality: Left;  . BRAIN SURGERY     1997 blood clot on the brain, then had to relieve fluid on the brain  . CARDIAC CATHETERIZATION    . CARDIAC CATHETERIZATION N/A 03/07/2015   Procedure: Left Heart Cath and Coronary Angiography;  Surgeon: Leonie Man, MD;  Location: [facility] CV LAB;  Service: Cardiovascular;  Laterality: N/A;  . CATARACT EXTRACTION, BILATERAL  2005  . CHOLECYSTECTOMY  2010  . CRANIOTOMY  1997   Left for SDH  . DEXA  7/05  . ESOPHAGOGASTRODUODENOSCOPY  04/04/2004  . EYE SURGERY    . HERNIA REPAIR    . KNEE ARTHROSCOPY  10/1998   Left  . LEFT HEART CATHETERIZATION WITH CORONARY ANGIOGRAM N/A 06/22/2014   Procedure: LEFT HEART CATHETERIZATION WITH CORONARY ANGIOGRAM;  Surgeon: Burnell Blanks, MD;  Location[facility] CATH LAB;  Service: Cardiovascular;  Laterality: N/A;  . MULTIPLE EXTRACTIONS WITH ALVEOLOPLASTY N/A 08/01/2014   Procedure: EXTRACTIONS OF TEETH NUMBERS 7 8 9 10 11  AND 19 AND ALVEOLOPLASTY UPPER LEFT  AND RIGHT QUADRANT;  Surgeon: Isac Caddy, DDS;  Location: [place];  Service: Oral Surgery;  Laterality: N/A;  . SPINE SURGERY     C-spine and lumbar surgery  . TUBAL LIGATION  1967  . TUBAL LIGATION      Current Outpatient Prescriptions  Medication Sig Dispense Refill  . acetaminophen (TYLENOL) 500 MG tablet Take 500 mg by mouth every 6 (six) hours as needed for mild pain.    Marland Kitchen albuterol (PROVENTIL HFA;VENTOLIN HFA) 108 (90 BASE) MCG/ACT inhaler Inhale 2 puffs into the lungs every 4 (four) hours as needed for wheezing or shortness of breath. 1 Inhaler 3  . allopurinol (ZYLOPRIM) 100 MG tablet Take 1 tablet (100 mg total) by mouth daily. 90 tablet 3  . amLODipine (NORVASC) 5 MG tablet Take 1 tablet (5 mg total) by mouth daily. 90 tablet 3  . aspirin EC 81 MG tablet Take 1 tablet (81 mg total) by mouth daily. 90 tablet 3  . atorvastatin (LIPITOR) 40 MG tablet TAKE 1 TABLET BY MOUTH DAILY AT 6 PM 90 tablet 0  . clopidogrel (PLAVIX) 75 MG tablet TAKE 1 TABLET BY MOUTH DAILY 90 tablet 0  . colestipol (COLESTID) 1 g tablet Take 1 g by mouth 3 (three) times daily.    Marland Kitchen donepezil (ARICEPT) 5 MG tablet Take 1 tablet (5 mg total) by mouth at bedtime. 90 tablet 3  . ezetimibe (ZETIA) 10 MG tablet  TAKE 1 TABLET BY MOUTH EVERY MORNING 90 tablet 0  . feeding supplement (GLUCERNA SHAKE) LIQD Take 237 mLs by mouth every morning.     . Fluticasone-Salmeterol (ADVAIR DISKUS) 100-50 MCG/DOSE AEPB Inhale 1 puff into the lungs 2 (two) times daily. 1 each 11  . gabapentin (NEURONTIN) 100 MG capsule TAKE 1 CAPSULE BY MOUTH EVERY NIGHT AT BEDTIME 90 capsule 3  . hydrALAZINE (APRESOLINE) 25 MG tablet Take 1 tablet (25 mg total) by mouth 3 (three) times daily. 270 tablet 3  . insulin NPH-regular Human (NOVOLIN 70/30) (70-30) 100 UNIT/ML injection Inject 5 Units into the skin daily with breakfast. 10 mL 11  . isosorbide mononitrate (IMDUR) 60 MG 24 hr tablet TAKE 1 TABLET BY MOUTH DAILY 90 tablet 2  . latanoprost (XALATAN) 0.005 % ophthalmic solution Place 1 drop into both eyes at bedtime.    . memantine (NAMENDA) 5 MG tablet Take 1 tablet (5 mg total) by mouth 2 (two) times daily. 60 tablet 11  . nitroGLYCERIN (NITROSTAT) 0.4 MG SL tablet Place 0.4 mg under the tongue every 5 (five) minutes as needed for chest pain.     . ONE TOUCH ULTRA TEST test strip USE TO TEST BLOOD SUGAR TWICE DAILY 100 each 2  . pantoprazole (PROTONIX) 40 MG tablet TAKE 1 TABLET BY MOUTH TWICE DAILY 180 tablet 0  . RANEXA 500 MG 12 hr tablet TAKE 1 TABLET BY MOUTH EVERY 12 HOURS 60 tablet 1  . sertraline (ZOLOFT) 100 MG tablet Take 1 tablet (100 mg total) by mouth daily. 90 tablet 2   No current facility-administered medications for this visit.    Allergies:   Iohexol   Social History:  The patient  reports that she has never smoked. She has never used smokeless tobacco. She reports that she does not drink alcohol or use drugs.   Family History:  The patient's family history includes Asthma in her other; Cancer - Other in her mother; Diabetes in her mother and sister; Heart disease in her mother; Hypertension in her father  and mother; Kidney disease in her paternal grandmother; Lymphoma in her father; Rheum arthritis in her  father and mother; Stomach cancer in her mother; Stroke in her paternal grandmother.*  ROS:  Please see the history of present illness.    All other systems are reviewed and otherwise negative.   PHYSICAL EXAM:  VS:  BP 124/64   Pulse 64   Ht 5\' 6"  (1.676 m)   Wt 159 lb (72.1 kg)   BMI 25.66 kg/m  BMI: Body mass index is 25.66 kg/m. Well nourished obese AAF, in no acute distress  HEENT: normocephalic, atraumatic  Neck: no JVD, carotid bruits or masses Cardiac:  normal S1, S2; RRR; no murmurs, rubs, or gallops Lungs:  clear to auscultation bilaterally, no wheezing, rhonchi or rales  Abd: soft, nontender, no hepatomegaly, + BS MS: no deformity or atrophy Ext: no edema on the right, prosthesis on the left, right radial cath site with resolving ecchymosis, no hematoma, good pulse Skin: warm and dry, no rash Neuro:  moves all extremities spontaneously, no focal abnormalities noted, follows commands Psych: euthymic mood, full affect  EKG:  03/06/2016, normal sinus rhythm with nonspecific ST T-wave abnormalities unchanged from prior EKG.  Recent Labs: 04/24/2015: B Natriuretic Peptide 1,594.7 04/25/2015: Magnesium 1.9 08/17/2015: ALT 19 02/17/2016: BUN 24; Creatinine, Ser 2.77; Hemoglobin 10.9; Platelets 247; Potassium 3.4; Sodium 138  No results found for requested labs within last 8760 hours.   CrCl cannot be calculated (Patient's most recent lab result is older than the maximum 21 days allowed.).   Wt Readings from Last 3 Encounters:  04/01/16 159 lb (72.1 kg)  03/18/16 164 lb (74.4 kg)  03/13/16 168 lb (76.2 kg)    Other studies reviewed: Additional studies/records reviewed today include: summarized above   ASSESSMENT AND PLAN:  1. Chest pain - suspicious for acute pulmonary embolism but negative chest CTA. Now resolved. 2. CAD, with most recent admission in May 2017 for Elevated troponin attributed to CKD stage IV, now with more significant fluid removal she is asymptomatic  and appears euvolemic. Continue current management. 3. Acute on chronic combined CHF - now euvolemic down to 159 pounds from 180 lbs, fluid management hemodialysis. CKD stage V - ESRD on HD. 4. Essential HTN - now rather hypotension after hemodialysis, I would further decrease amlodipine to 2.5 mg daily.. 5. H/o sinus bradycardia - stable. Not on BB due to this.  Follow-up in 2-3 weeks.  Romeo Rabon  04/01/2016 10:47 AM     [facility] [address] [place] [address] [phone] (office)  [phone] (fax)

## 2016-04-16 ENCOUNTER — Other Ambulatory Visit: Payer: Self-pay | Admitting: Endocrinology

## 2016-04-18 ENCOUNTER — Other Ambulatory Visit: Payer: Self-pay | Admitting: Endocrinology

## 2016-04-26 ENCOUNTER — Encounter: Payer: Self-pay | Admitting: Vascular Surgery

## 2016-05-01 ENCOUNTER — Encounter: Payer: Self-pay | Admitting: Vascular Surgery

## 2016-05-01 ENCOUNTER — Ambulatory Visit (HOSPITAL_COMMUNITY)
Admission: RE | Admit: 2016-05-01 | Discharge: 2016-05-01 | Disposition: A | Discharge status: Home / Self Care | Payer: Medicare Other | Source: Ambulatory Visit | Attending: Vascular Surgery | Admitting: Vascular Surgery

## 2016-05-01 ENCOUNTER — Ambulatory Visit (INDEPENDENT_AMBULATORY_CARE_PROVIDER_SITE_OTHER): Payer: Medicare Other | Admitting: Vascular Surgery

## 2016-05-01 VITALS — BP 145/62 | HR 67 | Temp 97.8°F | Resp 20 | Ht 66.0 in | Wt 164.0 lb

## 2016-05-01 DIAGNOSIS — I739 Peripheral vascular disease, unspecified: Secondary | ICD-10-CM

## 2016-05-01 DIAGNOSIS — Z89512 Acquired absence of left leg below knee: Secondary | ICD-10-CM | POA: Diagnosis not present

## 2016-05-01 NOTE — Progress Notes (Signed)
Patient name: [name] MRN: [id number] DOB: 13-Sep-1929 Sex: female  REASON FOR VISIT: Follow up of peripheral vascular disease.  HPI: [name] is a 82 y.o. female who has undergone previous left below the knee amputation. I've been following her peripheral vascular disease on the right side. I last saw her on 02/28/2016 and at that time her prosthesis was not fitting well.   Since I saw her last, they have made some adjustments to her prosthesis. She does have a small blister on the posterior medial aspect of her left BKA stump which is improving. On the right side she denies claudication, rest pain, or nonhealing ulcers. She is not a smoker.  Past Medical History:  Diagnosis Date  . Arthritis   . Asthma    Mild  . Bradycardia    a. Not on BB due to this.  Marland Kitchen CAD (coronary artery disease)    a. s/p multiple caths with nonobs CAD;   b. cath 1/10: pLAD 20%, mLAD 40%, pCFX 20%, mCFX 40%, pRCA 60-70%;   c.  Myoview 06/02/12: Low anterior wall scar, no ischemia, EF 37%. d. cath 06/20/2014 70% mid LCx, medical therapy, high risk for PCI due to close proximity to large OM e. cath 03/07/15 pro RCA 50% & pro to mid Cx 75%, both stable, LVEDP 33-46mm Hg-> medical management  . Cardiomyopathy ([place])    a. Echo 05/31/12: EF 40-45%. b. Echo 06/2014: EF 50-55%. c. Cath 02/2015: EF 45-50%.  . Chronic combined systolic and diastolic CHF (congestive heart failure) (HCC)    a. EF 50-55%, mild LVH and grade 1 diast. Dysfxn b. Grade 3 Diastolic Dysfunction [id number]. b. 02/2015: EF 45-50% by cath.  . CKD (chronic kidney disease), stage III    Dr. Lorrene Reid,    dialyzes Tuesday, Thursday, Saturday  . COPD (chronic obstructive pulmonary disease) ([place])   . Depression   . Diabetes mellitus    type II; peripheral neuropathy  . Diverticulosis   . DM retinopathy ([place])   . Dyslipidemia   . GERD (gastroesophageal reflux disease)   . Hiatal hernia   . History of shingles   . Hypertension   .  Noncompliance   . Obesity   . Osteoarthritis cervical spine   . Peptic stricture of esophagus   . Peptic ulcer disease    duodenal  . Peripheral vascular disease ([place])    a. s/p L BKA 08/2012.  Marland Kitchen Pruritic condition    Idiopathic  . Shortness of breath dyspnea   . Sleep apnea    with CPAP  . Uterine cancer ([place])   . Zoster     Family History  Problem Relation Age of Onset  . Cancer - Other Mother     "Stomach" Cancer  . Diabetes Mother   . Heart disease Mother   . Stomach cancer Mother   . Hypertension Mother   . Rheum arthritis Mother   . Lymphoma Father   . Hypertension Father   . Rheum arthritis Father   . Kidney disease Paternal Grandmother   . Stroke Paternal Grandmother   . Asthma Other   . Diabetes Sister   . Heart attack Neg Hx     SOCIAL HISTORY: Social History  Substance Use Topics  . Smoking status: Never Smoker  . Smokeless tobacco: Never Used  . Alcohol use No     Comment: rare    Allergies  Allergen Reactions  . Iohexol Anaphylaxis, Itching and Rash  Code: RASH, Desc: [place] ON PT'S CHART ALLERGIC TO IV DYE 09/04/07/RM, Onset Date: [id number]     Current Outpatient Prescriptions  Medication Sig Dispense Refill  . acetaminophen (TYLENOL) 500 MG tablet Take 500 mg by mouth every 6 (six) hours as needed for mild pain.    Marland Kitchen albuterol (PROVENTIL HFA;VENTOLIN HFA) 108 (90 BASE) MCG/ACT inhaler Inhale 2 puffs into the lungs every 4 (four) hours as needed for wheezing or shortness of breath. 1 Inhaler 3  . allopurinol (ZYLOPRIM) 100 MG tablet Take 1 tablet (100 mg total) by mouth daily. 90 tablet 3  . amLODipine (NORVASC) 2.5 MG tablet Take 1 tablet (2.5 mg total) by mouth daily. 90 tablet 3  . aspirin EC 81 MG tablet Take 1 tablet (81 mg total) by mouth daily. 90 tablet 3  . atorvastatin (LIPITOR) 40 MG tablet TAKE 1 TABLET BY MOUTH DAILY AT 6 PM 90 tablet 0  . clopidogrel (PLAVIX) 75 MG tablet TAKE 1 TABLET BY MOUTH DAILY 90 tablet 0  .  colestipol (COLESTID) 1 g tablet Take 1 g by mouth 3 (three) times daily.    Marland Kitchen donepezil (ARICEPT) 5 MG tablet Take 1 tablet (5 mg total) by mouth at bedtime. 90 tablet 3  . ezetimibe (ZETIA) 10 MG tablet TAKE 1 TABLET BY MOUTH EVERY MORNING 90 tablet 0  . ezetimibe (ZETIA) 10 MG tablet TAKE 1 TABLET BY MOUTH EVERY MORNING 90 tablet 0  . feeding supplement (GLUCERNA SHAKE) LIQD Take 237 mLs by mouth every morning.     . Fluticasone-Salmeterol (ADVAIR DISKUS) 100-50 MCG/DOSE AEPB Inhale 1 puff into the lungs 2 (two) times daily. 1 each 11  . gabapentin (NEURONTIN) 100 MG capsule TAKE 1 CAPSULE BY MOUTH EVERY NIGHT AT BEDTIME 90 capsule 3  . hydrALAZINE (APRESOLINE) 25 MG tablet Take 1 tablet (25 mg total) by mouth 3 (three) times daily. 270 tablet 3  . insulin NPH-regular Human (NOVOLIN 70/30) (70-30) 100 UNIT/ML injection Inject 5 Units into the skin daily with breakfast. 10 mL 11  . isosorbide mononitrate (IMDUR) 60 MG 24 hr tablet TAKE 1 TABLET BY MOUTH DAILY 90 tablet 2  . latanoprost (XALATAN) 0.005 % ophthalmic solution Place 1 drop into both eyes at bedtime.    . memantine (NAMENDA) 5 MG tablet Take 1 tablet (5 mg total) by mouth 2 (two) times daily. 60 tablet 11  . nitroGLYCERIN (NITROSTAT) 0.4 MG SL tablet Place 0.4 mg under the tongue every 5 (five) minutes as needed for chest pain.     . ONE TOUCH ULTRA TEST test strip USE TO TEST BLOOD SUGAR TWICE DAILY 100 each 2  . pantoprazole (PROTONIX) 40 MG tablet TAKE 1 TABLET BY MOUTH TWICE DAILY 180 tablet 0  . RANEXA 500 MG 12 hr tablet TAKE 1 TABLET BY MOUTH EVERY 12 HOURS 60 tablet 1  . sertraline (ZOLOFT) 100 MG tablet TAKE 1 TABLET(100 MG) BY MOUTH DAILY 90 tablet 0   No current facility-administered medications for this visit.     REVIEW OF SYSTEMS:  [X]  denotes positive finding, [ ]  denotes negative finding Cardiac  Comments:  Chest pain or chest pressure:    Shortness of breath upon exertion:    Short of breath when lying flat: X    Irregular heart rhythm: X       Vascular    Pain in calf, thigh, or hip brought on by ambulation:    Pain in feet at night that wakes you up from your  sleep:     Blood clot in your veins:    Leg swelling:         Pulmonary    Oxygen at home:    Productive cough:     Wheezing:         Neurologic    Sudden weakness in arms or legs:     Sudden numbness in arms or legs:     Sudden onset of difficulty speaking or slurred speech:    Temporary loss of vision in one eye:     Problems with dizziness:  X       Gastrointestinal    Blood in stool:     Vomited blood:         Genitourinary    Burning when urinating:     Blood in urine:        Psychiatric    Major depression:         Hematologic    Bleeding problems:    Problems with blood clotting too easily:        Skin    Rashes or ulcers:        Constitutional    Fever or chills:      PHYSICAL EXAM: Vitals:   05/01/16 1148 05/01/16 1154  BP: (!) 160/66 (!) 145/62  Pulse: 67   Resp: 20   Temp: 97.8 F (36.6 C)   TempSrc: Oral   SpO2: 99%   Weight: 164 lb (74.4 kg)   Height: 5\' 6"  (1.676 m)     GENERAL: The patient is a well-nourished female, in no acute distress. The vital signs are documented above. CARDIAC: There is a regular rate and rhythm.  VASCULAR: I do not detect carotid bruits. On the right side, she has a palpable femoral and popliteal pulse. I cannot palpate pedal pulses. The right foot is warm and well-perfused without ischemic ulcers. There is no right lower extremity swelling. On her left BKA stump. There is a very superficial ulceration less than a centimeter in diameter on the medial posterior aspect of her stump. PULMONARY: There is good air exchange bilaterally without wheezing or rales. ABDOMEN: Soft and non-tender with normal pitched bowel sounds.  MUSCULOSKELETAL: There are no major deformities or cyanosis. NEUROLOGIC: No focal weakness or paresthesias are detected. SKIN: There are no  ulcers or rashes noted. PSYCHIATRIC: The patient has a normal affect.  DATA:   ARTERIAL DOPPLER STUDY: The patient has monophasic Doppler signals in the dorsalis pedis and posterior tibial positions on the right side. ABIs 91%. Toe pressure on the right is 66 mmHg. She has a left BKA.  MEDICAL ISSUES:  PERIPHERAL VASCULAR DISEASE: She is asymptomatic on the right side. She has evidence of tibial artery occlusive disease on exam. Her toe pressure on the right is 66 mmHg suggesting adequate perfusion. She is not a smoker. I have encouraged her to stay as active as possible once she begins using her prosthesis again. I'll see her back in 1 year. I have ordered follow up ABIs for that time. She knows to call sooner if she has problems. She is on aspirin and is on a statin.  Deitra Mayo [facility] [phone]

## 2016-05-01 NOTE — Progress Notes (Signed)
Vitals:   05/01/16 1148 05/01/16 1154  BP: (!) 160/66 (!) 145/62  Pulse: 67   Resp: 20   Temp: 97.8 F (36.6 C)   TempSrc: Oral   SpO2: 99%   Weight: 164 lb (74.4 kg)   Height: 5\' 6"  (1.676 m)

## 2016-05-06 NOTE — Addendum Note (Signed)
Addended by: Mena Goes on: 05/06/2016 02:10 PM   Modules accepted: Orders

## 2016-05-15 ENCOUNTER — Ambulatory Visit: Payer: Medicare Other | Admitting: Cardiology

## 2016-05-19 NOTE — Progress Notes (Deleted)
   Subjective:    Patient ID: [name], female    DOB: [birth year], 82 y.o.   MRN: [id number]  HPI Pt returns for f/u of diabetes mellitus: DM type: Insulin-requiring type 2 Dx'ed: [id number] Complications: polyneuropathy, ESRD, CAD, and PAD.  Therapy: insulin since 1991 GDM: never DKA: never Severe hypoglycemia: last episode was in 2014.  Pancreatitis: never Other: therapy is limited by pt's request for least expensive insulin, and by her need for a simple regimen; husband administers insulin to her. Interval history: husband says he gives her 8 units QAM.  no cbg record, but states cbg's vary from 85-260.  It is in general higher as the day goes on   Review of Systems     Objective:   Physical Exam VITAL SIGNS:  See vs page.   GENERAL: no distress.  In wheelchair.   LLE: BKA.   Right foot: no deformity.  There is bilateral onychomycosis.  Skin: no ulcer, and normal color and temp, on the right foot.  Neuro: sensation is intact to touch on the right foot.   CV: 1+ right leg edema.        Assessment & Plan:

## 2016-05-20 ENCOUNTER — Ambulatory Visit: Payer: Medicare Other | Admitting: Endocrinology

## 2016-05-20 DIAGNOSIS — Z0289 Encounter for other administrative examinations: Secondary | ICD-10-CM

## 2016-06-18 DIAGNOSIS — N2581 Secondary hyperparathyroidism of renal origin: Secondary | ICD-10-CM | POA: Diagnosis not present

## 2016-06-18 DIAGNOSIS — N186 End stage renal disease: Secondary | ICD-10-CM | POA: Diagnosis not present

## 2016-06-18 DIAGNOSIS — D689 Coagulation defect, unspecified: Secondary | ICD-10-CM | POA: Diagnosis not present

## 2016-06-18 DIAGNOSIS — E1121 Type 2 diabetes mellitus with diabetic nephropathy: Secondary | ICD-10-CM | POA: Diagnosis not present

## 2016-06-20 ENCOUNTER — Other Ambulatory Visit: Payer: Self-pay | Admitting: Endocrinology

## 2016-06-20 DIAGNOSIS — D689 Coagulation defect, unspecified: Secondary | ICD-10-CM | POA: Diagnosis not present

## 2016-06-20 DIAGNOSIS — N186 End stage renal disease: Secondary | ICD-10-CM | POA: Diagnosis not present

## 2016-06-20 DIAGNOSIS — N2581 Secondary hyperparathyroidism of renal origin: Secondary | ICD-10-CM | POA: Diagnosis not present

## 2016-06-20 DIAGNOSIS — E1121 Type 2 diabetes mellitus with diabetic nephropathy: Secondary | ICD-10-CM | POA: Diagnosis not present

## 2016-06-21 ENCOUNTER — Encounter: Payer: Self-pay | Admitting: Endocrinology

## 2016-06-21 ENCOUNTER — Ambulatory Visit (INDEPENDENT_AMBULATORY_CARE_PROVIDER_SITE_OTHER): Payer: Medicare Other | Admitting: Endocrinology

## 2016-06-21 ENCOUNTER — Other Ambulatory Visit: Payer: Self-pay | Admitting: Endocrinology

## 2016-06-21 VITALS — BP 126/62 | HR 75 | Ht 66.0 in | Wt 159.0 lb

## 2016-06-21 DIAGNOSIS — Z23 Encounter for immunization: Secondary | ICD-10-CM | POA: Diagnosis not present

## 2016-06-21 DIAGNOSIS — Z794 Long term (current) use of insulin: Secondary | ICD-10-CM | POA: Diagnosis not present

## 2016-06-21 DIAGNOSIS — E1122 Type 2 diabetes mellitus with diabetic chronic kidney disease: Secondary | ICD-10-CM | POA: Diagnosis not present

## 2016-06-21 DIAGNOSIS — N184 Chronic kidney disease, stage 4 (severe): Secondary | ICD-10-CM | POA: Diagnosis not present

## 2016-06-21 DIAGNOSIS — Z0000 Encounter for general adult medical examination without abnormal findings: Secondary | ICD-10-CM | POA: Diagnosis not present

## 2016-06-21 LAB — LIPID PANEL
CHOL/HDL RATIO: 2
Cholesterol: 126 mg/dL (ref 0–200)
HDL: 81.6 mg/dL (ref >39.00)
LDL CALC: 31 mg/dL (ref 0–99)
NONHDL: 44.08
Triglycerides: 67 mg/dL (ref 0.0–149.0)
VLDL: 13.4 mg/dL (ref 0.0–40.0)

## 2016-06-21 LAB — POCT GLYCOSYLATED HEMOGLOBIN (HGB A1C): HEMOGLOBIN A1C: 7.5

## 2016-06-21 LAB — TSH: TSH: 1.46 u[IU]/mL (ref 0.35–4.50)

## 2016-06-21 MED ORDER — REPAGLINIDE 0.5 MG PO TABS: 0.5000 mg | ORAL_TABLET | Freq: Two times a day (BID) | ORAL | 11 refills | Status: DC

## 2016-06-21 NOTE — Progress Notes (Signed)
we discussed code status.  pt requests full code, but would not want to be started or maintained on artificial life-support measures if there was not a reasonable chance of recovery 

## 2016-06-21 NOTE — Patient Instructions (Addendum)
I have sent a prescription to your pharmacy, to change the insulin to "repaglinide." check your blood sugar once a day.  vary the time of day when you check, between before the 3 meals, and at bedtime.  also check if you have symptoms of your blood sugar being too high or too low.  please keep a record of the readings and bring it to your next appointment here (or you can bring the meter itself).  You can write it on any piece of paper.  please call us sooner if your blood sugar goes below 70, or if you have a lot of readings over 200.  blood tests are requested for you today.  We'll let you know about the results.   Please consider these measures for your health:  minimize alcohol.  Do not use tobacco products.  Have a colonoscopy at least every 10 years from age 63.  Women should have an annual mammogram from age 40.  Keep firearms safely stored.  Always use seat belts.  have working smoke alarms in your home.  See an eye doctor and dentist regularly.  Never drive under the influence of alcohol or drugs (including prescription drugs).   please let me know what your wishes would be, if artificial life support measures should become necessary.  It is critically important to prevent falling down (keep floor areas well-lit, dry, and free of loose objects.  If you have a cane, walker, or wheelchair, you should use it, even for short trips around the house.  Wear flat-soled shoes.  Also, try not to rush).   Please come back for a follow-up appointment in 4 months.

## 2016-06-21 NOTE — Progress Notes (Signed)
Subjective:    Patient ID: [name], female    DOB: [birth year], 83 y.o.   MRN: [id number]  HPI Pt is here for regular wellness examination, and is feeling pretty well in general, and says chronic med probs are stable, except as noted below Past Medical History:  Diagnosis Date  . Arthritis   . Asthma    Mild  . Bradycardia    a. Not on BB due to this.  Marland Kitchen CAD (coronary artery disease)    a. s/p multiple caths with nonobs CAD;   b. cath 1/10: pLAD 20%, mLAD 40%, pCFX 20%, mCFX 40%, pRCA 60-70%;   c.  Myoview 06/02/12: Low anterior wall scar, no ischemia, EF 37%. d. cath 06/20/2014 70% mid LCx, medical therapy, high risk for PCI due to close proximity to large OM e. cath 03/07/15 pro RCA 50% & pro to mid Cx 75%, both stable, LVEDP 33-69mm Hg-> medical management  . Cardiomyopathy ([place])    a. Echo 05/31/12: EF 40-45%. b. Echo 06/2014: EF 50-55%. c. Cath 02/2015: EF 45-50%.  . Chronic combined systolic and diastolic CHF (congestive heart failure) (HCC)    a. EF 50-55%, mild LVH and grade 1 diast. Dysfxn b. Grade 3 Diastolic Dysfunction [id number]. b. 02/2015: EF 45-50% by cath.  . CKD (chronic kidney disease), stage III    Dr. Lorrene Reid,    dialyzes Tuesday, Thursday, Saturday  . COPD (chronic obstructive pulmonary disease) ([place])   . Depression   . Diabetes mellitus    type II; peripheral neuropathy  . Diverticulosis   . DM retinopathy ([place])   . Dyslipidemia   . GERD (gastroesophageal reflux disease)   . Hiatal hernia   . History of shingles   . Hypertension   . Noncompliance   . Obesity   . Osteoarthritis cervical spine   . Peptic stricture of esophagus   . Peptic ulcer disease    duodenal  . Peripheral vascular disease ([place])    a. s/p L BKA 08/2012.  Marland Kitchen Pruritic condition    Idiopathic  . Shortness of breath dyspnea   . Sleep apnea    with CPAP  . Uterine cancer ([place])   . Zoster     Past Surgical History:  Procedure Laterality Date  . ABDOMINAL ANGIOGRAM N/A 08/31/2012     Procedure: ABDOMINAL ANGIOGRAM with runoff poss intervention;  Surgeon: Angelia Mould, MD;  [facility] C[facility] LAB;  Service: Cardiovascular;  Laterality: N/A;  . AMPUTATION Left 09/04/2012   Procedure: AMPUTATION BELOW KNEE;  Surgeon: Angelia Mould, MD;  Location: [place];  Service: Vascular;  Laterality: Left;  . AV FISTULA PLACEMENT Left 05/10/2015   Procedure: CREATION OF LEFT UPPER ARM ARTERIOVENOUS (AV) FISTULA ;  Surgeon: Elam Dutch, MD;  Location: [facility];  Service: Vascular;  Laterality: Left;  . BRAIN SURGERY     1997 blood clot on the brain, then had to relieve fluid on the brain  . CARDIAC CATHETERIZATION    . CARDIAC CATHETERIZATION N/A 03/07/2015   Procedure: Left Heart Cath and Coronary Angiography;  Surgeon: Leonie Man, MD;  Location: [place] CV LAB;  Service: Cardiovascular;  Laterality: N/A;  . CATARACT EXTRACTION, BILATERAL  2005  . CHOLECYSTECTOMY  2010  . CRANIOTOMY  1997   Left for SDH  . DEXA  7/05  . ESOPHAGOGASTRODUODENOSCOPY  04/04/2004  . EYE SURGERY    . HERNIA REPAIR    . KNEE ARTHROSCOPY  10/1998   Left  .  LEFT HEART CATHETERIZATION WITH CORONARY ANGIOGRAM N/A 06/22/2014   Procedure: LEFT HEART CATHETERIZATION WITH CORONARY ANGIOGRAM;  Surgeon: Burnell Blanks, MD;  Location: [facility] CATH LAB;  Service: Cardiovascular;  Laterality: N/A;  . MULTIPLE EXTRACTIONS WITH ALVEOLOPLASTY N/A 08/01/2014   Procedure: EXTRACTIONS OF TEETH NUMBERS 7 8 9 10 11  AND 19 AND ALVEOLOPLASTY UPPER LEFT AND RIGHT QUADRANT;  Surgeon: Isac Caddy, DDS;  Location: [place];  Service: Oral Surgery;  Laterality: N/A;  . SPINE SURGERY     C-spine and lumbar surgery  . TUBAL LIGATION  1967  . TUBAL LIGATION      Social History   Social History  . Marital status: Married    Spouse name: N/A  . Number of children: 7  . Years of education: N/A   Occupational History  . Retired Retired   Social History Main Topics  . Smoking status: Never Smoker   . Smokeless tobacco: Never Used  . Alcohol use No     Comment: rare  . Drug use: No  . Sexual activity: No   Other Topics Concern  . Not on file   Social History Narrative  . No narrative on file    Current Outpatient Prescriptions on File Prior to Visit  Medication Sig Dispense Refill  . acetaminophen (TYLENOL) 500 MG tablet Take 500 mg by mouth every 6 (six) hours as needed for mild pain.    Marland Kitchen albuterol (PROVENTIL HFA;VENTOLIN HFA) 108 (90 BASE) MCG/ACT inhaler Inhale 2 puffs into the lungs every 4 (four) hours as needed for wheezing or shortness of breath. 1 Inhaler 3  . allopurinol (ZYLOPRIM) 100 MG tablet Take 1 tablet (100 mg total) by mouth daily. 90 tablet 3  . amLODipine (NORVASC) 2.5 MG tablet Take 1 tablet (2.5 mg total) by mouth daily. 90 tablet 3  . aspirin EC 81 MG tablet Take 1 tablet (81 mg total) by mouth daily. 90 tablet 3  . atorvastatin (LIPITOR) 40 MG tablet TAKE 1 TABLET BY MOUTH DAILY AT 6 PM 90 tablet 0  . clopidogrel (PLAVIX) 75 MG tablet TAKE 1 TABLET BY MOUTH DAILY 90 tablet 0  . colestipol (COLESTID) 1 g tablet Take 1 g by mouth 3 (three) times daily.    Marland Kitchen donepezil (ARICEPT) 5 MG tablet Take 1 tablet (5 mg total) by mouth at bedtime. 90 tablet 3  . ezetimibe (ZETIA) 10 MG tablet TAKE 1 TABLET BY MOUTH EVERY MORNING 90 tablet 0  . feeding supplement (GLUCERNA SHAKE) LIQD Take 237 mLs by mouth every morning.     . Fluticasone-Salmeterol (ADVAIR DISKUS) 100-50 MCG/DOSE AEPB Inhale 1 puff into the lungs 2 (two) times daily. 1 each 11  . gabapentin (NEURONTIN) 100 MG capsule TAKE 1 CAPSULE BY MOUTH EVERY NIGHT AT BEDTIME 90 capsule 3  . hydrALAZINE (APRESOLINE) 25 MG tablet Take 1 tablet (25 mg total) by mouth 3 (three) times daily. 270 tablet 3  . isosorbide mononitrate (IMDUR) 60 MG 24 hr tablet TAKE 1 TABLET BY MOUTH DAILY 90 tablet 2  . latanoprost (XALATAN) 0.005 % ophthalmic solution Place 1 drop into both eyes at bedtime.    . memantine (NAMENDA) 5 MG  tablet Take 1 tablet (5 mg total) by mouth 2 (two) times daily. 60 tablet 11  . nitroGLYCERIN (NITROSTAT) 0.4 MG SL tablet Place 0.4 mg under the tongue every 5 (five) minutes as needed for chest pain.     . ONE TOUCH ULTRA TEST test strip USE TO TEST BLOOD  SUGAR TWICE DAILY 100 each 2  . pantoprazole (PROTONIX) 40 MG tablet TAKE 1 TABLET BY MOUTH TWICE DAILY 180 tablet 0  . RANEXA 500 MG 12 hr tablet TAKE 1 TABLET BY MOUTH EVERY 12 HOURS 60 tablet 1  . sertraline (ZOLOFT) 100 MG tablet TAKE 1 TABLET(100 MG) BY MOUTH DAILY 90 tablet 0   No current facility-administered medications on file prior to visit.     Allergies  Allergen Reactions  . Iohexol Anaphylaxis, Itching and Rash     Code: RASH, Desc: [place] ON PT'S CHART ALLERGIC TO IV DYE 09/04/07/RM, Onset Date: [id number]     Family History  Problem Relation Age of Onset  . Cancer - Other Mother     "Stomach" Cancer  . Diabetes Mother   . Heart disease Mother   . Stomach cancer Mother   . Hypertension Mother   . Rheum arthritis Mother   . Lymphoma Father   . Hypertension Father   . Rheum arthritis Father   . Kidney disease Paternal Grandmother   . Stroke Paternal Grandmother   . Asthma Other   . Diabetes Sister   . Heart attack Neg Hx     BP 126/62   Pulse 75   Ht 5\' 6"  (1.676 m)   Wt 159 lb (72.1 kg)   SpO2 96%   BMI 25.66 kg/m     Review of Systems Constitutional: Negative for fever.  HENT: Negative for hearing loss.   Eyes: Negative for visual disturbance.  Respiratory: Negative for shortness of breath.   Cardiovascular: Negative for chest pain.  Gastrointestinal: Negative for anal bleeding.  Endocrine: she has cold intolerance.  Genitourinary: Negative for hematuria.  Musculoskeletal: Positive for gait problem.  Skin: Negative for rash.  Allergic/Immunologic: Negative for environmental allergies.  Neurological: Positive for numbness. Negative for syncope.  Hematological: Bruises/bleeds easily.    Psychiatric/Behavioral: Negative for dysphoric mood.     Objective:   Physical Exam HEAD: no deformity eyes: no periorbital swelling, no proptosis external nose and ears are normal mouth: no lesion seen NECK: supple, thyroid is not enlarged CHEST WALL: no deformity LUNGS:  Clear to auscultation BREASTS: sees gyn CV: reg rate and rhythm, no murmur ABD: abdomen is soft, nontender.  no hepatosplenomegaly.  not distended.  no hernia GENITALIA/RECTAL: sees gyn MUSCULOSKELETAL: muscle bulk and strength are grossly normal.  no obvious joint swelling.  gait is normal and steady PULSES: no carotid bruit NEURO:  cn 2-12 grossly intact.   readily moves all 4's.  SKIN:  Normal texture and temperature.  No rash or suspicious lesion is visible.   NODES:  None palpable at the neck PSYCH: alert, well-oriented.  Does not appear anxious nor depressed.       Assessment & Plan:  Wellness visit today, with problems stable, except as noted.    SEPARATE EVALUATION FOLLOWS--EACH PROBLEM HERE IS NEW, NOT RESPONDING TO TREATMENT, OR POSES SIGNIFICANT RISK TO THE PATIENT'S HEALTH: HISTORY OF THE PRESENT ILLNESS: Pt returns for f/u of diabetes mellitus: DM type: Insulin-requiring type 2 Dx'ed: [id number] Complications: polyneuropathy, ESRD, CAD, and PAD.  Therapy: insulin since 1991 GDM: never DKA: never Severe hypoglycemia: last episode was in 2014.  Pancreatitis: never Other: therapy is limited by pt's request for least expensive insulin, and by her need for a simple regimen; husband administers insulin to her. Interval history: husband says he gives her 5 units QAM.  no cbg record, but states cbg's are well-controlled.   PAST MEDICAL HISTORY Past  Medical History:  Diagnosis Date  . Arthritis   . Asthma    Mild  . Bradycardia    a. Not on BB due to this.  Marland Kitchen CAD (coronary artery disease)    a. s/p multiple caths with nonobs CAD;   b. cath 1/10: pLAD 20%, mLAD 40%, pCFX 20%, mCFX 40%, pRCA 60-70%;    c.  Myoview 06/02/12: Low anterior wall scar, no ischemia, EF 37%. d. cath 06/20/2014 70% mid LCx, medical therapy, high risk for PCI due to close proximity to large OM e. cath 03/07/15 pro RCA 50% & pro to mid Cx 75%, both stable, LVEDP 33-33mm Hg-> medical management  . Cardiomyopathy ([place])    a. Echo 05/31/12: EF 40-45%. b. Echo 06/2014: EF 50-55%. c. Cath 02/2015: EF 45-50%.  . Chronic combined systolic and diastolic CHF (congestive heart failure) (HCC)    a. EF 50-55%, mild LVH and grade 1 diast. Dysfxn b. Grade 3 Diastolic Dysfunction [id number]. b. 02/2015: EF 45-50% by cath.  . CKD (chronic kidney disease), stage III    Dr. Lorrene Reid,    dialyzes Tuesday, Thursday, Saturday  . COPD (chronic obstructive pulmonary disease) ([place])   . Depression   . Diabetes mellitus    type II; peripheral neuropathy  . Diverticulosis   . DM retinopathy ([place])   . Dyslipidemia   . GERD (gastroesophageal reflux disease)   . Hiatal hernia   . History of shingles   . Hypertension   . Noncompliance   . Obesity   . Osteoarthritis cervical spine   . Peptic stricture of esophagus   . Peptic ulcer disease    duodenal  . Peripheral vascular disease ([place])    a. s/p L BKA 08/2012.  Marland Kitchen Pruritic condition    Idiopathic  . Shortness of breath dyspnea   . Sleep apnea    with CPAP  . Uterine cancer ([place])   . Zoster     Past Surgical History:  Procedure Laterality Date  . ABDOMINAL ANGIOGRAM N/A 08/31/2012   Procedure: ABDOMINAL ANGIOGRAM with runoff poss intervention;  Surgeon: Angelia Mould, MD;  Location: [facility] CATH LAB;  Service: Cardiovascular;  Laterality: N/A;  . AMPUTATION Left 09/04/2012   Procedure: AMPUTATION BELOW KNEE;  Surgeon: Angelia Mould, MD;  Location: [place];  Service: Vascular;  Laterality: Left;  . AV FISTULA PLACEMENT Left 05/10/2015   Procedure: CREATION OF LEFT UPPER ARM ARTERIOVENOUS (AV) FISTULA ;  Surgeon: Elam Dutch, MD;  Location: [facility];  Service: Vascular;  Laterality:  Left;  . BRAIN SURGERY     1997 blood clot on the brain, then had to relieve fluid on the brain  . CARDIAC CATHETERIZATION    . CARDIAC CATHETERIZATION N/A 03/07/2015   Procedure: Left Heart Cath and Coronary Angiography;  Surgeon: Leonie Man, MD;  Location: [place] CV LAB;  Service: Cardiovascular;  Laterality: N/A;  . CATARACT EXTRACTION, BILATERAL  2005  . CHOLECYSTECTOMY  2010  . CRANIOTOMY  1997   Left for SDH  . DEXA  7/05  . ESOPHAGOGASTRODUODENOSCOPY  04/04/2004  . EYE SURGERY    . HERNIA REPAIR    . KNEE ARTHROSCOPY  10/1998   Left  . LEFT HEART CATHETERIZATION WITH CORONARY ANGIOGRAM N/A 06/22/2014   Procedure: LEFT HEART CATHETERIZATION WITH CORONARY ANGIOGRAM;  Surgeon: Burnell Blanks, MD;  [facility] CATH LAB;  Service: Cardiovascular;  Laterality: N/A;  . MULTIPLE EXTRACTIONS WITH ALVEOLOPLASTY N/A 08/01/2014   Procedure: EXTRACTIONS OF TEETH NUMBERS  7 8 9 10 11  AND 19 AND ALVEOLOPLASTY UPPER LEFT AND RIGHT QUADRANT;  Surgeon: Isac Caddy, DDS;  Location: [place];  Service: Oral Surgery;  Laterality: N/A;  . SPINE SURGERY     C-spine and lumbar surgery  . TUBAL LIGATION  1967  . TUBAL LIGATION      Social History   Social History  . Marital status: Married    Spouse name: N/A  . Number of children: 7  . Years of education: N/A   Occupational History  . Retired Retired   Social History Main Topics  . Smoking status: Never Smoker  . Smokeless tobacco: Never Used  . Alcohol use No     Comment: rare  . Drug use: No  . Sexual activity: No   Other Topics Concern  . Not on file   Social History Narrative  . No narrative on file    Current Outpatient Prescriptions on File Prior to Visit  Medication Sig Dispense Refill  . acetaminophen (TYLENOL) 500 MG tablet Take 500 mg by mouth every 6 (six) hours as needed for mild pain.    Marland Kitchen albuterol (PROVENTIL HFA;VENTOLIN HFA) 108 (90 BASE) MCG/ACT inhaler Inhale 2 puffs into the lungs every 4  (four) hours as needed for wheezing or shortness of breath. 1 Inhaler 3  . allopurinol (ZYLOPRIM) 100 MG tablet Take 1 tablet (100 mg total) by mouth daily. 90 tablet 3  . amLODipine (NORVASC) 2.5 MG tablet Take 1 tablet (2.5 mg total) by mouth daily. 90 tablet 3  . aspirin EC 81 MG tablet Take 1 tablet (81 mg total) by mouth daily. 90 tablet 3  . atorvastatin (LIPITOR) 40 MG tablet TAKE 1 TABLET BY MOUTH DAILY AT 6 PM 90 tablet 0  . clopidogrel (PLAVIX) 75 MG tablet TAKE 1 TABLET BY MOUTH DAILY 90 tablet 0  . colestipol (COLESTID) 1 g tablet Take 1 g by mouth 3 (three) times daily.    Marland Kitchen donepezil (ARICEPT) 5 MG tablet Take 1 tablet (5 mg total) by mouth at bedtime. 90 tablet 3  . ezetimibe (ZETIA) 10 MG tablet TAKE 1 TABLET BY MOUTH EVERY MORNING 90 tablet 0  . feeding supplement (GLUCERNA SHAKE) LIQD Take 237 mLs by mouth every morning.     . Fluticasone-Salmeterol (ADVAIR DISKUS) 100-50 MCG/DOSE AEPB Inhale 1 puff into the lungs 2 (two) times daily. 1 each 11  . gabapentin (NEURONTIN) 100 MG capsule TAKE 1 CAPSULE BY MOUTH EVERY NIGHT AT BEDTIME 90 capsule 3  . hydrALAZINE (APRESOLINE) 25 MG tablet Take 1 tablet (25 mg total) by mouth 3 (three) times daily. 270 tablet 3  . isosorbide mononitrate (IMDUR) 60 MG 24 hr tablet TAKE 1 TABLET BY MOUTH DAILY 90 tablet 2  . latanoprost (XALATAN) 0.005 % ophthalmic solution Place 1 drop into both eyes at bedtime.    . memantine (NAMENDA) 5 MG tablet Take 1 tablet (5 mg total) by mouth 2 (two) times daily. 60 tablet 11  . nitroGLYCERIN (NITROSTAT) 0.4 MG SL tablet Place 0.4 mg under the tongue every 5 (five) minutes as needed for chest pain.     . ONE TOUCH ULTRA TEST test strip USE TO TEST BLOOD SUGAR TWICE DAILY 100 each 2  . pantoprazole (PROTONIX) 40 MG tablet TAKE 1 TABLET BY MOUTH TWICE DAILY 180 tablet 0  . RANEXA 500 MG 12 hr tablet TAKE 1 TABLET BY MOUTH EVERY 12 HOURS 60 tablet 1  . sertraline (ZOLOFT) 100 MG tablet TAKE  1 TABLET(100 MG) BY  MOUTH DAILY 90 tablet 0   No current facility-administered medications on file prior to visit.     Allergies  Allergen Reactions  . Iohexol Anaphylaxis, Itching and Rash     Code: RASH, Desc: [place] ON PT'S CHART ALLERGIC TO IV DYE 09/04/07/RM, Onset Date: [id number]     Family History  Problem Relation Age of Onset  . Cancer - Other Mother     "Stomach" Cancer  . Diabetes Mother   . Heart disease Mother   . Stomach cancer Mother   . Hypertension Mother   . Rheum arthritis Mother   . Lymphoma Father   . Hypertension Father   . Rheum arthritis Father   . Kidney disease Paternal Grandmother   . Stroke Paternal Grandmother   . Asthma Other   . Diabetes Sister   . Heart attack Neg Hx     BP 126/62   Pulse 75   Ht 5\' 6"  (1.676 m)   Wt 159 lb (72.1 kg)   SpO2 96%   BMI 25.66 kg/m   REVIEW OF SYSTEMS: She denies hypoglycemia.   PHYSICAL EXAMINATION: VITAL SIGNS:  See vs page.   GENERAL: no distress.  In wheelchair.   LLE: BKA.   Right foot: no deformity.  There is bilateral onychomycosis.  Skin: no ulcer, and normal color and temp, on the right foot.  Neuro: sensation is intact to touch on the right foot.   CV: 1+ right leg edema.  LAB/XRAY RESULTS: Lab Results  Component Value Date   HGBA1C 7.5 06/21/2016  IMPRESSION: Type 2 DM, with renal failure: she is ready to d/c insulin PLAN:  Change insulin to repaglinide

## 2016-06-22 DIAGNOSIS — E1121 Type 2 diabetes mellitus with diabetic nephropathy: Secondary | ICD-10-CM | POA: Diagnosis not present

## 2016-06-22 DIAGNOSIS — N186 End stage renal disease: Secondary | ICD-10-CM | POA: Diagnosis not present

## 2016-06-22 DIAGNOSIS — R69 Illness, unspecified: Secondary | ICD-10-CM | POA: Diagnosis not present

## 2016-06-22 DIAGNOSIS — D689 Coagulation defect, unspecified: Secondary | ICD-10-CM | POA: Diagnosis not present

## 2016-06-22 DIAGNOSIS — N2581 Secondary hyperparathyroidism of renal origin: Secondary | ICD-10-CM | POA: Diagnosis not present

## 2016-06-24 LAB — FRUCTOSAMINE: Fructosamine: 382 umol/L — ABNORMAL HIGH (ref 190–270)

## 2016-06-25 DIAGNOSIS — E1121 Type 2 diabetes mellitus with diabetic nephropathy: Secondary | ICD-10-CM | POA: Diagnosis not present

## 2016-06-25 DIAGNOSIS — N186 End stage renal disease: Secondary | ICD-10-CM | POA: Diagnosis not present

## 2016-06-25 DIAGNOSIS — D689 Coagulation defect, unspecified: Secondary | ICD-10-CM | POA: Diagnosis not present

## 2016-06-25 DIAGNOSIS — N2581 Secondary hyperparathyroidism of renal origin: Secondary | ICD-10-CM | POA: Diagnosis not present

## 2016-06-27 DIAGNOSIS — D689 Coagulation defect, unspecified: Secondary | ICD-10-CM | POA: Diagnosis not present

## 2016-06-27 DIAGNOSIS — N186 End stage renal disease: Secondary | ICD-10-CM | POA: Diagnosis not present

## 2016-06-27 DIAGNOSIS — N2581 Secondary hyperparathyroidism of renal origin: Secondary | ICD-10-CM | POA: Diagnosis not present

## 2016-06-27 DIAGNOSIS — E1121 Type 2 diabetes mellitus with diabetic nephropathy: Secondary | ICD-10-CM | POA: Diagnosis not present

## 2016-06-28 DIAGNOSIS — I871 Compression of vein: Secondary | ICD-10-CM | POA: Diagnosis not present

## 2016-06-28 DIAGNOSIS — Z992 Dependence on renal dialysis: Secondary | ICD-10-CM | POA: Diagnosis not present

## 2016-06-28 DIAGNOSIS — T82858A Stenosis of vascular prosthetic devices, implants and grafts, initial encounter: Secondary | ICD-10-CM | POA: Diagnosis not present

## 2016-06-28 DIAGNOSIS — N186 End stage renal disease: Secondary | ICD-10-CM | POA: Diagnosis not present

## 2016-06-29 DIAGNOSIS — N2581 Secondary hyperparathyroidism of renal origin: Secondary | ICD-10-CM | POA: Diagnosis not present

## 2016-06-29 DIAGNOSIS — D689 Coagulation defect, unspecified: Secondary | ICD-10-CM | POA: Diagnosis not present

## 2016-06-29 DIAGNOSIS — E1121 Type 2 diabetes mellitus with diabetic nephropathy: Secondary | ICD-10-CM | POA: Diagnosis not present

## 2016-06-29 DIAGNOSIS — N186 End stage renal disease: Secondary | ICD-10-CM | POA: Diagnosis not present

## 2016-07-02 DIAGNOSIS — N186 End stage renal disease: Secondary | ICD-10-CM | POA: Diagnosis not present

## 2016-07-02 DIAGNOSIS — E1121 Type 2 diabetes mellitus with diabetic nephropathy: Secondary | ICD-10-CM | POA: Diagnosis not present

## 2016-07-02 DIAGNOSIS — D689 Coagulation defect, unspecified: Secondary | ICD-10-CM | POA: Diagnosis not present

## 2016-07-02 DIAGNOSIS — N2581 Secondary hyperparathyroidism of renal origin: Secondary | ICD-10-CM | POA: Diagnosis not present

## 2016-07-04 DIAGNOSIS — E1121 Type 2 diabetes mellitus with diabetic nephropathy: Secondary | ICD-10-CM | POA: Diagnosis not present

## 2016-07-04 DIAGNOSIS — N2581 Secondary hyperparathyroidism of renal origin: Secondary | ICD-10-CM | POA: Diagnosis not present

## 2016-07-04 DIAGNOSIS — N186 End stage renal disease: Secondary | ICD-10-CM | POA: Diagnosis not present

## 2016-07-04 DIAGNOSIS — D689 Coagulation defect, unspecified: Secondary | ICD-10-CM | POA: Diagnosis not present

## 2016-07-05 ENCOUNTER — Other Ambulatory Visit: Payer: Self-pay | Admitting: Endocrinology

## 2016-07-05 ENCOUNTER — Other Ambulatory Visit: Payer: Self-pay | Admitting: Cardiology

## 2016-07-06 DIAGNOSIS — E1121 Type 2 diabetes mellitus with diabetic nephropathy: Secondary | ICD-10-CM | POA: Diagnosis not present

## 2016-07-06 DIAGNOSIS — D689 Coagulation defect, unspecified: Secondary | ICD-10-CM | POA: Diagnosis not present

## 2016-07-06 DIAGNOSIS — N186 End stage renal disease: Secondary | ICD-10-CM | POA: Diagnosis not present

## 2016-07-06 DIAGNOSIS — N2581 Secondary hyperparathyroidism of renal origin: Secondary | ICD-10-CM | POA: Diagnosis not present

## 2016-07-08 ENCOUNTER — Ambulatory Visit (INDEPENDENT_AMBULATORY_CARE_PROVIDER_SITE_OTHER): Payer: Medicare HMO | Admitting: Cardiology

## 2016-07-08 VITALS — BP 130/50 | HR 60 | Ht 66.0 in | Wt 157.0 lb

## 2016-07-08 DIAGNOSIS — I952 Hypotension due to drugs: Secondary | ICD-10-CM | POA: Diagnosis not present

## 2016-07-08 DIAGNOSIS — E782 Mixed hyperlipidemia: Secondary | ICD-10-CM

## 2016-07-08 DIAGNOSIS — N183 Chronic kidney disease, stage 3 unspecified: Secondary | ICD-10-CM

## 2016-07-08 DIAGNOSIS — I2510 Atherosclerotic heart disease of native coronary artery without angina pectoris: Secondary | ICD-10-CM

## 2016-07-08 DIAGNOSIS — R001 Bradycardia, unspecified: Secondary | ICD-10-CM | POA: Diagnosis not present

## 2016-07-08 NOTE — Progress Notes (Signed)
Patient ID: [name], female   DOB: [birth year], 82 y.o.   MRN: [id number]      Cardiology Office Note Date:  07/08/2016  Patient ID:  [name], [name] 21-Apr-1930, MRN [id number] PCP:  Renato Shin, MD  Cardiologist:  Dr. Meda Coffee   Chief Complaint: TOC visit posthospitalization 4 days.  History of Present Illness:  [name] is a 83 y.o. female with history of chronic combined CHF (EF 45-50% by [facility] 02/2015), CAD (NSTEMI 06/2014 with LHC showing moderate disease treated medically, recent cath 02/2015 with stable anatomy), DM, HTN, HLD, OSA (on CPAP), PVD s/p L BKA 08/2012, CKD stage III, COPD, bradycardia (not on BB due to this) who presents for post-hospital follow-up. Last echo 06/2014: EF 50-55%, no rWMA, grade 3 DD, mildly dilated LA.  She was admitted 9/19-9/22/16 with chest pain, SOB, left arm pain and right leg pain. She was admitted for [place] and had mild troponin elevation to 0.19. Cath 03/07/15 showed stable coronary angiography and elevated LVEDP - elevated troponin was felt more likely supply/demand in setting of CHF. Bidil was added in place of hydralazine, along with Ranexa. She also received IV Lasix post-cath. She had AKI on CKD with elevation of Cr to 1.83 and was kept in the hospital for further monitoring. At discharge her Cr was 1.94.   She comes in to clinic today doing well. She is accompanied by her husband. She was hospitalized in Npovember with acute on chronic CKD and started on HD. She is currently having it done via perm cath, s/p fistula surgery, not mature yet. Her weight 183 lbs, she has PND about 1x/week, some LE edema, no DOE or CP. No bleeding, no palpitations or syncope.  10/23/2015 - the patient was hospitalized on May 1 with acute chest pain after hemodialysis. She has mild troponin elevation with flat friend that was attributed to her CK D stage IV. She now feels significantly better, no fluids are managed to hemodialysis. She has maturing  fistula, in the meantime she is using dialysis catheter on her chest. She denies any chest pain or shortness of breath nor orthopnea or dyspnea, no edema in her right lower extremity, left his updated above the knee.  03/06/2016, the patient went to the car on 02/17/2016 she was found to have infected dialysis catheter date was removed. Ever since then she has been feeling chest pain that is on deep inspiration but also feels like pressure radiating to her back. She also has noticed worsening shortness of breath. She denies any palpitations or syncope. She's been experiencing chest pain every day.  04/01/16 - 1 month follow up, at the last visit the patient was referred for an urgent chest CT with concern for an acute pulmonary embolism, but it was negative. Today she states that she feels significantly better, she denies chest pain or resting SOB, she is minimally active sec to a prosthetic leg.  She is complaining of orthostatic hypotension, no falls or syncope.   07/08/2016 - (3 months follow-up, the patient states that she feels great, she continues with her dialysis, denies chest pain or shortness of breath, no edema in her right lower extremity. She complains of dizziness when standing up every single morning and she feels like she's going to pass out but so far no syncope or falls. She denies any side effects with atorvastatin.   Past Medical History:  Diagnosis Date  . Arthritis   . Asthma    Mild  .  Bradycardia    a. Not on BB due to this.  Marland Kitchen CAD (coronary artery disease)    a. s/p multiple caths with nonobs CAD;   b. cath 1/10: pLAD 20%, mLAD 40%, pCFX 20%, mCFX 40%, pRCA 60-70%;   c.  Myoview 06/02/12: Low anterior wall scar, no ischemia, EF 37%. d. cath 06/20/2014 70% mid LCx, medical therapy, high risk for PCI due to close proximity to large OM e. cath 03/07/15 pro RCA 50% & pro to mid Cx 75%, both stable, LVEDP 33-66mm Hg-> medical management  . Cardiomyopathy ([place])    a. Echo  05/31/12: EF 40-45%. b. Echo 06/2014: EF 50-55%. c. Cath 02/2015: EF 45-50%.  . Chronic combined systolic and diastolic CHF (congestive heart failure) (HCC)    a. EF 50-55%, mild LVH and grade 1 diast. Dysfxn b. Grade 3 Diastolic Dysfunction [id number]. b. 02/2015: EF 45-50% by cath.  . CKD (chronic kidney disease), stage III    Dr. Lorrene Reid,    dialyzes Tuesday, Thursday, Saturday  . COPD (chronic obstructive pulmonary disease) ([place])   . Depression   . Diabetes mellitus    type II; peripheral neuropathy  . Diverticulosis   . DM retinopathy ([place])   . Dyslipidemia   . GERD (gastroesophageal reflux disease)   . Hiatal hernia   . History of shingles   . Hypertension   . Noncompliance   . Obesity   . Osteoarthritis cervical spine   . Peptic stricture of esophagus   . Peptic ulcer disease    duodenal  . Peripheral vascular disease ([place])    a. s/p L BKA 08/2012.  Marland Kitchen Pruritic condition    Idiopathic  . Shortness of breath dyspnea   . Sleep apnea    with CPAP  . Uterine cancer ([place])   . Zoster     Past Surgical History:  Procedure Laterality Date  . ABDOMINAL ANGIOGRAM N/A 08/31/2012   Procedure: ABDOMINAL ANGIOGRAM with runoff poss intervention;  Surgeon: Angelia Mould, MD;  Location: [facility] CATH LAB;  Service: Cardiovascular;  Laterality: N/A;  . AMPUTATION Left 09/04/2012   Procedure: AMPUTATION BELOW KNEE;  Surgeon: Angelia Mould, MD;  Location: [place];  Service: Vascular;  Laterality: Left;  . AV FISTULA PLACEMENT Left 05/10/2015   Procedure: CREATION OF LEFT UPPER ARM ARTERIOVENOUS (AV) FISTULA ;  Surgeon: Elam Dutch, MD;  Location: [place];  Service: Vascular;  Laterality: Left;  . BRAIN SURGERY     1997 blood clot on the brain, then had to relieve fluid on the brain  . CARDIAC CATHETERIZATION    . CARDIAC CATHETERIZATION N/A 03/07/2015   Procedure: Left Heart Cath and Coronary Angiography;  Surgeon: Leonie Man, MD;  Location: [place] CV LAB;  Service:  Cardiovascular;  Laterality: N/A;  . CATARACT EXTRACTION, BILATERAL  2005  . CHOLECYSTECTOMY  2010  . CRANIOTOMY  1997   Left for SDH  . DEXA  7/05  . ESOPHAGOGASTRODUODENOSCOPY  04/04/2004  . EYE SURGERY    . HERNIA REPAIR    . KNEE ARTHROSCOPY  10/1998   Left  . LEFT HEART CATHETERIZATION WITH CORONARY ANGIOGRAM N/A 06/22/2014   Procedure: LEFT HEART CATHETERIZATION WITH CORONARY ANGIOGRAM;  Surgeon: Burnell Blanks, MD;  Location: [facility] CATH LAB;  Service: Cardiovascular;  Laterality: N/A;  . MULTIPLE EXTRACTIONS WITH ALVEOLOPLASTY N/A 08/01/2014   Procedure: EXTRACTIONS OF TEETH NUMBERS 7 8 9 10 11  AND 19 AND ALVEOLOPLASTY UPPER LEFT AND RIGHT QUADRANT;  Surgeon: Doreatha Martin  [place], DDS;  Location: [place];  Service: Oral Surgery;  Laterality: N/A;  . SPINE SURGERY     C-spine and lumbar surgery  . TUBAL LIGATION  1967  . TUBAL LIGATION      Current Outpatient Prescriptions  Medication Sig Dispense Refill  . acetaminophen (TYLENOL) 500 MG tablet Take 500 mg by mouth every 6 (six) hours as needed for mild pain.    Marland Kitchen albuterol (PROVENTIL HFA;VENTOLIN HFA) 108 (90 BASE) MCG/ACT inhaler Inhale 2 puffs into the lungs every 4 (four) hours as needed for wheezing or shortness of breath. 1 Inhaler 3  . allopurinol (ZYLOPRIM) 100 MG tablet Take 1 tablet (100 mg total) by mouth daily. 90 tablet 3  . amLODipine (NORVASC) 2.5 MG tablet Take 1 tablet (2.5 mg total) by mouth daily. 90 tablet 3  . aspirin EC 81 MG tablet Take 1 tablet (81 mg total) by mouth daily. 90 tablet 3  . atorvastatin (LIPITOR) 40 MG tablet TAKE 1 TABLET BY MOUTH DAILY AT 6 PM 90 tablet 0  . clopidogrel (PLAVIX) 75 MG tablet TAKE 1 TABLET BY MOUTH DAILY 90 tablet 0  . colestipol (COLESTID) 1 g tablet Take 1 g by mouth 3 (three) times daily.    . colestipol (COLESTID) 1 g tablet TAKE 3 TABLETS(3 GRAMS) BY MOUTH DAILY 90 tablet 0  . donepezil (ARICEPT) 5 MG tablet Take 1 tablet (5 mg total) by mouth at bedtime. 90 tablet 3    . ezetimibe (ZETIA) 10 MG tablet TAKE 1 TABLET BY MOUTH EVERY MORNING 90 tablet 0  . feeding supplement (GLUCERNA SHAKE) LIQD Take 237 mLs by mouth every morning.     . Fluticasone-Salmeterol (ADVAIR DISKUS) 100-50 MCG/DOSE AEPB Inhale 1 puff into the lungs 2 (two) times daily. 1 each 11  . gabapentin (NEURONTIN) 100 MG capsule TAKE 1 CAPSULE BY MOUTH EVERY NIGHT AT BEDTIME 90 capsule 3  . hydrALAZINE (APRESOLINE) 25 MG tablet Take 1 tablet (25 mg total) by mouth 3 (three) times daily. 270 tablet 3  . isosorbide mononitrate (IMDUR) 60 MG 24 hr tablet TAKE 1 TABLET BY MOUTH DAILY 90 tablet 2  . latanoprost (XALATAN) 0.005 % ophthalmic solution Place 1 drop into both eyes at bedtime.    . memantine (NAMENDA) 5 MG tablet Take 1 tablet (5 mg total) by mouth 2 (two) times daily. 60 tablet 11  . nitroGLYCERIN (NITROSTAT) 0.4 MG SL tablet Place 0.4 mg under the tongue every 5 (five) minutes as needed for chest pain.     . ONE TOUCH ULTRA TEST test strip USE TO TEST BLOOD SUGAR TWICE DAILY 100 each 2  . pantoprazole (PROTONIX) 40 MG tablet TAKE 1 TABLET BY MOUTH TWICE DAILY 180 tablet 0  . RANEXA 500 MG 12 hr tablet TAKE 1 TABLET BY MOUTH EVERY 12 HOURS 60 tablet 1  . repaglinide (PRANDIN) 0.5 MG tablet TAKE 1 TABLET(0.5 MG) BY MOUTH TWICE DAILY BEFORE BREAKFAST AND SUPPER 180 tablet 11  . sertraline (ZOLOFT) 100 MG tablet TAKE 1 TABLET(100 MG) BY MOUTH DAILY 90 tablet 0   No current facility-administered medications for this visit.    Allergies:   Iohexol   Social History:  The patient  reports that she has never smoked. She has never used smokeless tobacco. She reports that she does not drink alcohol or use drugs.   Family History:  The patient's family history includes Asthma in her other; Cancer - Other in her mother; Diabetes in her mother and sister; Heart  disease in her mother; Hypertension in her father and mother; Kidney disease in her paternal grandmother; Lymphoma in her father; Rheum  arthritis in her father and mother; Stomach cancer in her mother; Stroke in her paternal grandmother.*  ROS:  Please see the history of present illness.    All other systems are reviewed and otherwise negative.   PHYSICAL EXAM:  VS:  There were no vitals taken for this visit. BMI: There is no height or weight on file to calculate BMI. Well nourished obese AAF, in no acute distress  HEENT: normocephalic, atraumatic  Neck: no JVD, carotid bruits or masses Cardiac:  normal S1, S2; RRR; no murmurs, rubs, or gallops Lungs:  clear to auscultation bilaterally, no wheezing, rhonchi or rales  Abd: soft, nontender, no hepatomegaly, + BS MS: no deformity or atrophy Ext: no edema on the right, prosthesis on the left, right radial cath site with resolving ecchymosis, no hematoma, good pulse Skin: warm and dry, no rash Neuro:  moves all extremities spontaneously, no focal abnormalities noted, follows commands Psych: euthymic mood, full affect  EKG:  03/06/2016, normal sinus rhythm with nonspecific ST T-wave abnormalities unchanged from prior EKG.  Recent Labs: 08/17/2015: ALT 19 02/17/2016: BUN 24; Creatinine, Ser 2.77; Hemoglobin 10.9; Platelets 247; Potassium 3.4; Sodium 138 06/21/2016: TSH 1.46  06/21/2016: Cholesterol 126; HDL 81.60; LDL Cholesterol 31; Total CHOL/HDL Ratio 2; Triglycerides 67.0; VLDL 13.4   CrCl cannot be calculated (Patient's most recent lab result is older than the maximum 21 days allowed.).   Wt Readings from Last 3 Encounters:  06/21/16 159 lb (72.1 kg)  05/01/16 164 lb (74.4 kg)  04/01/16 159 lb (72.1 kg)    Other studies reviewed: Additional studies/records reviewed today include: summarized above    ASSESSMENT AND PLAN:  1. Chest pain - suspicious for acute pulmonary embolism but negative chest CTA. Now resolved. 2. CAD, with most recent admission in May 2017 for Elevated troponin attributed to CKD stage IV, now with more significant fluid removal she is asymptomatic  and appears euvolemic. Continue current management. 3. Acute on chronic combined CHF - now euvolemic down to 157 pounds from 180 lbs, fluid management hemodialysis. CKD stage V - ESRD on HD. She is NYHA IIa. 4. Essential HTN - controlled, but orthostatic, I will discontinue amlodipine. 5. H/o sinus bradycardia - stable. Not on BB due to this. 6. Hyperlipidemia - Lipids at goal on 06/17/2017 with LDL of 31 and HDL of 81, she is tolerating atorvastatin well will continue.  Follow-up in 4 months.  Signed, Ena Dawley , MD 07/08/2016 9:34 AM     [facility] [name] [place] [address] [phone] (office)  [phone] (fax)

## 2016-07-08 NOTE — Patient Instructions (Signed)
Medication Instructions:  Please stop your Amlodipine.  Continue all other medications as listed.  Follow-Up: Follow up in 4 months with Dr. Meda Coffee.  You will receive a letter in the mail 2 months before you are due.  Please call us when you receive this letter to schedule your follow up appointment.  If you need a refill on your cardiac medications before your next appointment, please call your pharmacy.  Thank you for choosing [facility]!!

## 2016-07-09 DIAGNOSIS — N186 End stage renal disease: Secondary | ICD-10-CM | POA: Diagnosis not present

## 2016-07-09 DIAGNOSIS — N2581 Secondary hyperparathyroidism of renal origin: Secondary | ICD-10-CM | POA: Diagnosis not present

## 2016-07-09 DIAGNOSIS — E1121 Type 2 diabetes mellitus with diabetic nephropathy: Secondary | ICD-10-CM | POA: Diagnosis not present

## 2016-07-09 DIAGNOSIS — D689 Coagulation defect, unspecified: Secondary | ICD-10-CM | POA: Diagnosis not present

## 2016-07-11 DIAGNOSIS — D689 Coagulation defect, unspecified: Secondary | ICD-10-CM | POA: Diagnosis not present

## 2016-07-11 DIAGNOSIS — N186 End stage renal disease: Secondary | ICD-10-CM | POA: Diagnosis not present

## 2016-07-11 DIAGNOSIS — N2581 Secondary hyperparathyroidism of renal origin: Secondary | ICD-10-CM | POA: Diagnosis not present

## 2016-07-11 DIAGNOSIS — E1121 Type 2 diabetes mellitus with diabetic nephropathy: Secondary | ICD-10-CM | POA: Diagnosis not present

## 2016-07-13 DIAGNOSIS — E1121 Type 2 diabetes mellitus with diabetic nephropathy: Secondary | ICD-10-CM | POA: Diagnosis not present

## 2016-07-13 DIAGNOSIS — N186 End stage renal disease: Secondary | ICD-10-CM | POA: Diagnosis not present

## 2016-07-13 DIAGNOSIS — N2581 Secondary hyperparathyroidism of renal origin: Secondary | ICD-10-CM | POA: Diagnosis not present

## 2016-07-13 DIAGNOSIS — D689 Coagulation defect, unspecified: Secondary | ICD-10-CM | POA: Diagnosis not present

## 2016-07-16 DIAGNOSIS — N2581 Secondary hyperparathyroidism of renal origin: Secondary | ICD-10-CM | POA: Diagnosis not present

## 2016-07-16 DIAGNOSIS — E1121 Type 2 diabetes mellitus with diabetic nephropathy: Secondary | ICD-10-CM | POA: Diagnosis not present

## 2016-07-16 DIAGNOSIS — N186 End stage renal disease: Secondary | ICD-10-CM | POA: Diagnosis not present

## 2016-07-16 DIAGNOSIS — D689 Coagulation defect, unspecified: Secondary | ICD-10-CM | POA: Diagnosis not present

## 2016-07-17 DIAGNOSIS — Z992 Dependence on renal dialysis: Secondary | ICD-10-CM | POA: Diagnosis not present

## 2016-07-17 DIAGNOSIS — N186 End stage renal disease: Secondary | ICD-10-CM | POA: Diagnosis not present

## 2016-07-17 DIAGNOSIS — N10 Acute tubulo-interstitial nephritis: Secondary | ICD-10-CM | POA: Diagnosis not present

## 2016-07-18 DIAGNOSIS — N2581 Secondary hyperparathyroidism of renal origin: Secondary | ICD-10-CM | POA: Diagnosis not present

## 2016-07-18 DIAGNOSIS — N186 End stage renal disease: Secondary | ICD-10-CM | POA: Diagnosis not present

## 2016-07-18 DIAGNOSIS — D689 Coagulation defect, unspecified: Secondary | ICD-10-CM | POA: Diagnosis not present

## 2016-07-20 DIAGNOSIS — N2581 Secondary hyperparathyroidism of renal origin: Secondary | ICD-10-CM | POA: Diagnosis not present

## 2016-07-20 DIAGNOSIS — D689 Coagulation defect, unspecified: Secondary | ICD-10-CM | POA: Diagnosis not present

## 2016-07-20 DIAGNOSIS — N186 End stage renal disease: Secondary | ICD-10-CM | POA: Diagnosis not present

## 2016-07-22 ENCOUNTER — Telehealth: Payer: Self-pay | Admitting: Endocrinology

## 2016-07-22 NOTE — Telephone Encounter (Signed)
See message and please advise,, Thanks!

## 2016-07-22 NOTE — Telephone Encounter (Signed)
Pt is have UTI symptoms: urination hurts

## 2016-07-22 NOTE — Telephone Encounter (Signed)
3 PM today, please

## 2016-07-22 NOTE — Telephone Encounter (Signed)
Attempted to reach the patient. Patient was not available and did have a working Advertising account executive. Will try again at a later time.

## 2016-07-23 ENCOUNTER — Ambulatory Visit (INDEPENDENT_AMBULATORY_CARE_PROVIDER_SITE_OTHER): Payer: Medicare HMO | Admitting: Endocrinology

## 2016-07-23 DIAGNOSIS — D689 Coagulation defect, unspecified: Secondary | ICD-10-CM | POA: Diagnosis not present

## 2016-07-23 DIAGNOSIS — R300 Dysuria: Secondary | ICD-10-CM | POA: Diagnosis not present

## 2016-07-23 DIAGNOSIS — N2581 Secondary hyperparathyroidism of renal origin: Secondary | ICD-10-CM | POA: Diagnosis not present

## 2016-07-23 DIAGNOSIS — N186 End stage renal disease: Secondary | ICD-10-CM | POA: Diagnosis not present

## 2016-07-23 MED ORDER — CEFUROXIME AXETIL 250 MG PO TABS: 250.0000 mg | ORAL_TABLET | Freq: Two times a day (BID) | ORAL | 0 refills | Status: AC

## 2016-07-23 NOTE — Patient Instructions (Addendum)
Please continue the same medication for diabetes. I have sent a prescription to your pharmacy, for an antibiotic pill. check your blood sugar once a day.  vary the time of day when you check, between before the 3 meals, and at bedtime.  also check if you have symptoms of your blood sugar being too high or too low.  please keep a record of the readings and bring it to your next appointment here (or you can bring the meter itself).  You can write it on any piece of paper.  please call us sooner if your blood sugar goes below 70, or if you have a lot of readings over 200. Please come back for a follow-up appointment in 3 months.

## 2016-07-23 NOTE — Telephone Encounter (Signed)
I contacted the patient and advised of the message via voicemail and requested a call back from the patient to schedule an appointment.

## 2016-07-23 NOTE — Progress Notes (Signed)
Subjective:    Patient ID: [name], female    DOB: [birth year], 83 y.o.   MRN: [id number]  HPI  Pt returns for f/u of diabetes mellitus: DM type: Insulin-requiring type 2 Dx'ed: [id number] Complications: polyneuropathy, ESRD, CAD, and PAD.  Therapy: insulin since 1991 GDM: never DKA: never Severe hypoglycemia: last episode was in 2014.  Pancreatitis: never Other: she took insulin 1991-2018.  Interval history: husband states cbg's are well-controlled.  Pt states few days of moderate pain at the urethra, in the context of urination, but no assoc hematuria. However, she just came from HD, and cannot produce a specimen now.   Past Medical History:  Diagnosis Date  . Arthritis   . Asthma    Mild  . Bradycardia    a. Not on BB due to this.  Marland Kitchen CAD (coronary artery disease)    a. s/p multiple caths with nonobs CAD;   b. cath 1/10: pLAD 20%, mLAD 40%, pCFX 20%, mCFX 40%, pRCA 60-70%;   c.  Myoview 06/02/12: Low anterior wall scar, no ischemia, EF 37%. d. cath 06/20/2014 70% mid LCx, medical therapy, high risk for PCI due to close proximity to large OM e. cath 03/07/15 pro RCA 50% & pro to mid Cx 75%, both stable, LVEDP 33-71mm Hg-> medical management  . Cardiomyopathy ([place])    a. Echo 05/31/12: EF 40-45%. b. Echo 06/2014: EF 50-55%. c. Cath 02/2015: EF 45-50%.  . Chronic combined systolic and diastolic CHF (congestive heart failure) (HCC)    a. EF 50-55%, mild LVH and grade 1 diast. Dysfxn b. Grade 3 Diastolic Dysfunction [id number]. b. 02/2015: EF 45-50% by cath.  . CKD (chronic kidney disease), stage III    Dr. Lorrene Reid,    dialyzes Tuesday, Thursday, Saturday  . COPD (chronic obstructive pulmonary disease) ([place])   . Depression   . Diabetes mellitus    type II; peripheral neuropathy  . Diverticulosis   . DM retinopathy ([place])   . Dyslipidemia   . GERD (gastroesophageal reflux disease)   . Hiatal hernia   . History of shingles   . Hypertension   . Noncompliance   . Obesity   .  Osteoarthritis cervical spine   . Peptic stricture of esophagus   . Peptic ulcer disease    duodenal  . Peripheral vascular disease ([place])    a. s/p L BKA 08/2012.  Marland Kitchen Pruritic condition    Idiopathic  . Shortness of breath dyspnea   . Sleep apnea    with CPAP  . Uterine cancer ([place])   . Zoster     Past Surgical History:  Procedure Laterality Date  . ABDOMINAL ANGIOGRAM N/A 08/31/2012   Procedure: ABDOMINAL ANGIOGRAM with runoff poss intervention;  Surgeon: Angelia Mould, MD;  Location[facility] CATH LAB;  Service: Cardiovascular;  Laterality: N/A;  . AMPUTATION Left 09/04/2012   Procedure: AMPUTATION BELOW KNEE;  Surgeon: Angelia Mould, MD;  Location: [place];  Service: Vascular;  Laterality: Left;  . AV FISTULA PLACEMENT Left 05/10/2015   Procedure: CREATION OF LEFT UPPER ARM ARTERIOVENOUS (AV) FISTULA ;  Surgeon: Elam Dutch, MD;  Location: [place];  Service: Vascular;  Laterality: Left;  . BRAIN SURGERY     1997 blood clot on the brain, then had to relieve fluid on the brain  . CARDIAC CATHETERIZATION    . CARDIAC CATHETERIZATION N/A 03/07/2015   Procedure: Left Heart Cath and Coronary Angiography;  Surgeon: Leonie Man, MD;  Location: [place] CV  LAB;  Service: Cardiovascular;  Laterality: N/A;  . CATARACT EXTRACTION, BILATERAL  2005  . CHOLECYSTECTOMY  2010  . CRANIOTOMY  1997   Left for SDH  . DEXA  7/05  . ESOPHAGOGASTRODUODENOSCOPY  04/04/2004  . EYE SURGERY    . HERNIA REPAIR    . KNEE ARTHROSCOPY  10/1998   Left  . LEFT HEART CATHETERIZATION WITH CORONARY ANGIOGRAM N/A 06/22/2014   Procedure: LEFT HEART CATHETERIZATION WITH CORONARY ANGIOGRAM;  Surgeon: Burnell Blanks, MD;  Location: [facility] CATH LAB;  Service: Cardiovascular;  Laterality: N/A;  . MULTIPLE EXTRACTIONS WITH ALVEOLOPLASTY N/A 08/01/2014   Procedure: EXTRACTIONS OF TEETH NUMBERS 7 8 9 10 11  AND 19 AND ALVEOLOPLASTY UPPER LEFT AND RIGHT QUADRANT;  Surgeon: Isac Caddy, DDS;   Location: [place];  Service: Oral Surgery;  Laterality: N/A;  . SPINE SURGERY     C-spine and lumbar surgery  . TUBAL LIGATION  1967  . TUBAL LIGATION      Social History   Social History  . Marital status: Married    Spouse name: N/A  . Number of children: 7  . Years of education: N/A   Occupational History  . Retired Retired   Social History Main Topics  . Smoking status: Never Smoker  . Smokeless tobacco: Never Used  . Alcohol use No     Comment: rare  . Drug use: No  . Sexual activity: No   Other Topics Concern  . Not on file   Social History Narrative  . No narrative on file    Current Outpatient Prescriptions on File Prior to Visit  Medication Sig Dispense Refill  . acetaminophen (TYLENOL) 500 MG tablet Take 500 mg by mouth every 6 (six) hours as needed for mild pain.    Marland Kitchen albuterol (PROVENTIL HFA;VENTOLIN HFA) 108 (90 BASE) MCG/ACT inhaler Inhale 2 puffs into the lungs every 4 (four) hours as needed for wheezing or shortness of breath. 1 Inhaler 3  . allopurinol (ZYLOPRIM) 100 MG tablet Take 1 tablet (100 mg total) by mouth daily. 90 tablet 3  . aspirin EC 81 MG tablet Take 1 tablet (81 mg total) by mouth daily. 90 tablet 3  . atorvastatin (LIPITOR) 40 MG tablet TAKE 1 TABLET BY MOUTH DAILY AT 6 PM 90 tablet 0  . clopidogrel (PLAVIX) 75 MG tablet TAKE 1 TABLET BY MOUTH DAILY 90 tablet 0  . colestipol (COLESTID) 1 g tablet TAKE 3 TABLETS(3 GRAMS) BY MOUTH DAILY 90 tablet 0  . donepezil (ARICEPT) 5 MG tablet Take 1 tablet (5 mg total) by mouth at bedtime. 90 tablet 3  . ezetimibe (ZETIA) 10 MG tablet TAKE 1 TABLET BY MOUTH EVERY MORNING 90 tablet 0  . feeding supplement (GLUCERNA SHAKE) LIQD Take 237 mLs by mouth every morning.     . Fluticasone-Salmeterol (ADVAIR DISKUS) 100-50 MCG/DOSE AEPB Inhale 1 puff into the lungs 2 (two) times daily. 1 each 11  . gabapentin (NEURONTIN) 100 MG capsule TAKE 1 CAPSULE BY MOUTH EVERY NIGHT AT BEDTIME 90 capsule 3  . hydrALAZINE  (APRESOLINE) 25 MG tablet Take 1 tablet (25 mg total) by mouth 3 (three) times daily. 270 tablet 3  . isosorbide mononitrate (IMDUR) 60 MG 24 hr tablet TAKE 1 TABLET BY MOUTH DAILY 90 tablet 2  . latanoprost (XALATAN) 0.005 % ophthalmic solution Place 1 drop into both eyes at bedtime.    . memantine (NAMENDA) 5 MG tablet Take 1 tablet (5 mg total) by mouth 2 (two) times  daily. 60 tablet 11  . nitroGLYCERIN (NITROSTAT) 0.4 MG SL tablet Place 0.4 mg under the tongue every 5 (five) minutes as needed for chest pain.     . ONE TOUCH ULTRA TEST test strip USE TO TEST BLOOD SUGAR TWICE DAILY 100 each 2  . pantoprazole (PROTONIX) 40 MG tablet TAKE 1 TABLET BY MOUTH TWICE DAILY 180 tablet 0  . RANEXA 500 MG 12 hr tablet TAKE 1 TABLET BY MOUTH EVERY 12 HOURS 60 tablet 1  . repaglinide (PRANDIN) 0.5 MG tablet TAKE 1 TABLET(0.5 MG) BY MOUTH TWICE DAILY BEFORE BREAKFAST AND SUPPER 180 tablet 11  . sertraline (ZOLOFT) 100 MG tablet TAKE 1 TABLET(100 MG) BY MOUTH DAILY 90 tablet 0   No current facility-administered medications on file prior to visit.     Allergies  Allergen Reactions  . Iohexol Anaphylaxis, Itching and Rash     Code: RASH, Desc: [place] ON PT'S CHART ALLERGIC TO IV DYE 09/04/07/RM, Onset Date: [id number]     Family History  Problem Relation Age of Onset  . Cancer - Other Mother     "Stomach" Cancer  . Diabetes Mother   . Heart disease Mother   . Stomach cancer Mother   . Hypertension Mother   . Rheum arthritis Mother   . Lymphoma Father   . Hypertension Father   . Rheum arthritis Father   . Kidney disease Paternal Grandmother   . Stroke Paternal Grandmother   . Asthma Other   . Diabetes Sister   . Heart attack Neg Hx     BP (!) 124/52   Pulse 74   Temp 98.7 F (37.1 C) (Oral)   Wt 159 lb (72.1 kg)   SpO2 98%   BMI 25.66 kg/m    Review of Systems Denies fever and hypoglycemia    Objective:   Physical Exam VITAL SIGNS:  See vs page.  GENERAL: no distress.    Abd: slight suprapubic tenderness.        Assessment & Plan:  Type 2 DM: well-controlled off insulin Dysuria, new, uncertain etiology.  Patient is advised the following: Patient Instructions  Please continue the same medication for diabetes. I have sent a prescription to your pharmacy, for an antibiotic pill. check your blood sugar once a day.  vary the time of day when you check, between before the 3 meals, and at bedtime.  also check if you have symptoms of your blood sugar being too high or too low.  please keep a record of the readings and bring it to your next appointment here (or you can bring the meter itself).  You can write it on any piece of paper.  please call us sooner if your blood sugar goes below 70, or if you have a lot of readings over 200. Please come back for a follow-up appointment in 3 months.

## 2016-07-25 DIAGNOSIS — N186 End stage renal disease: Secondary | ICD-10-CM | POA: Diagnosis not present

## 2016-07-25 DIAGNOSIS — D689 Coagulation defect, unspecified: Secondary | ICD-10-CM | POA: Diagnosis not present

## 2016-07-25 DIAGNOSIS — N2581 Secondary hyperparathyroidism of renal origin: Secondary | ICD-10-CM | POA: Diagnosis not present

## 2016-07-26 ENCOUNTER — Other Ambulatory Visit: Payer: Self-pay | Admitting: Endocrinology

## 2016-07-27 ENCOUNTER — Other Ambulatory Visit: Payer: Self-pay | Admitting: Endocrinology

## 2016-07-27 DIAGNOSIS — N186 End stage renal disease: Secondary | ICD-10-CM | POA: Diagnosis not present

## 2016-07-27 DIAGNOSIS — N2581 Secondary hyperparathyroidism of renal origin: Secondary | ICD-10-CM | POA: Diagnosis not present

## 2016-07-27 DIAGNOSIS — D689 Coagulation defect, unspecified: Secondary | ICD-10-CM | POA: Diagnosis not present

## 2016-07-30 DIAGNOSIS — N186 End stage renal disease: Secondary | ICD-10-CM | POA: Diagnosis not present

## 2016-07-30 DIAGNOSIS — N2581 Secondary hyperparathyroidism of renal origin: Secondary | ICD-10-CM | POA: Diagnosis not present

## 2016-07-30 DIAGNOSIS — D689 Coagulation defect, unspecified: Secondary | ICD-10-CM | POA: Diagnosis not present

## 2016-08-01 DIAGNOSIS — D689 Coagulation defect, unspecified: Secondary | ICD-10-CM | POA: Diagnosis not present

## 2016-08-01 DIAGNOSIS — N186 End stage renal disease: Secondary | ICD-10-CM | POA: Diagnosis not present

## 2016-08-01 DIAGNOSIS — N2581 Secondary hyperparathyroidism of renal origin: Secondary | ICD-10-CM | POA: Diagnosis not present

## 2016-08-02 ENCOUNTER — Other Ambulatory Visit: Payer: Self-pay | Admitting: Endocrinology

## 2016-08-03 DIAGNOSIS — D689 Coagulation defect, unspecified: Secondary | ICD-10-CM | POA: Diagnosis not present

## 2016-08-03 DIAGNOSIS — N2581 Secondary hyperparathyroidism of renal origin: Secondary | ICD-10-CM | POA: Diagnosis not present

## 2016-08-03 DIAGNOSIS — N186 End stage renal disease: Secondary | ICD-10-CM | POA: Diagnosis not present

## 2016-08-06 DIAGNOSIS — N2581 Secondary hyperparathyroidism of renal origin: Secondary | ICD-10-CM | POA: Diagnosis not present

## 2016-08-06 DIAGNOSIS — R69 Illness, unspecified: Secondary | ICD-10-CM | POA: Diagnosis not present

## 2016-08-06 DIAGNOSIS — N186 End stage renal disease: Secondary | ICD-10-CM | POA: Diagnosis not present

## 2016-08-06 DIAGNOSIS — D689 Coagulation defect, unspecified: Secondary | ICD-10-CM | POA: Diagnosis not present

## 2016-08-07 DIAGNOSIS — G309 Alzheimer's disease, unspecified: Secondary | ICD-10-CM | POA: Diagnosis not present

## 2016-08-07 DIAGNOSIS — M792 Neuralgia and neuritis, unspecified: Secondary | ICD-10-CM | POA: Diagnosis not present

## 2016-08-07 DIAGNOSIS — I209 Angina pectoris, unspecified: Secondary | ICD-10-CM | POA: Diagnosis not present

## 2016-08-07 DIAGNOSIS — I248 Other forms of acute ischemic heart disease: Secondary | ICD-10-CM | POA: Diagnosis not present

## 2016-08-07 DIAGNOSIS — I130 Hypertensive heart and chronic kidney disease with heart failure and stage 1 through stage 4 chronic kidney disease, or unspecified chronic kidney disease: Secondary | ICD-10-CM | POA: Diagnosis not present

## 2016-08-07 DIAGNOSIS — N3946 Mixed incontinence: Secondary | ICD-10-CM | POA: Diagnosis not present

## 2016-08-07 DIAGNOSIS — M1A9XX0 Chronic gout, unspecified, without tophus (tophi): Secondary | ICD-10-CM | POA: Diagnosis not present

## 2016-08-07 DIAGNOSIS — K5909 Other constipation: Secondary | ICD-10-CM | POA: Diagnosis not present

## 2016-08-07 DIAGNOSIS — E1122 Type 2 diabetes mellitus with diabetic chronic kidney disease: Secondary | ICD-10-CM | POA: Diagnosis not present

## 2016-08-07 DIAGNOSIS — Z0000 Encounter for general adult medical examination without abnormal findings: Secondary | ICD-10-CM | POA: Diagnosis not present

## 2016-08-07 DIAGNOSIS — K219 Gastro-esophageal reflux disease without esophagitis: Secondary | ICD-10-CM | POA: Diagnosis not present

## 2016-08-07 DIAGNOSIS — M545 Low back pain: Secondary | ICD-10-CM | POA: Diagnosis not present

## 2016-08-07 DIAGNOSIS — R69 Illness, unspecified: Secondary | ICD-10-CM | POA: Diagnosis not present

## 2016-08-07 DIAGNOSIS — H409 Unspecified glaucoma: Secondary | ICD-10-CM | POA: Diagnosis not present

## 2016-08-07 DIAGNOSIS — J42 Unspecified chronic bronchitis: Secondary | ICD-10-CM | POA: Diagnosis not present

## 2016-08-07 DIAGNOSIS — E784 Other hyperlipidemia: Secondary | ICD-10-CM | POA: Diagnosis not present

## 2016-08-07 DIAGNOSIS — N189 Chronic kidney disease, unspecified: Secondary | ICD-10-CM | POA: Diagnosis not present

## 2016-08-07 DIAGNOSIS — G3184 Mild cognitive impairment, so stated: Secondary | ICD-10-CM | POA: Diagnosis not present

## 2016-08-07 DIAGNOSIS — M25579 Pain in unspecified ankle and joints of unspecified foot: Secondary | ICD-10-CM | POA: Diagnosis not present

## 2016-08-08 DIAGNOSIS — N186 End stage renal disease: Secondary | ICD-10-CM | POA: Diagnosis not present

## 2016-08-08 DIAGNOSIS — D689 Coagulation defect, unspecified: Secondary | ICD-10-CM | POA: Diagnosis not present

## 2016-08-08 DIAGNOSIS — N2581 Secondary hyperparathyroidism of renal origin: Secondary | ICD-10-CM | POA: Diagnosis not present

## 2016-08-10 DIAGNOSIS — N2581 Secondary hyperparathyroidism of renal origin: Secondary | ICD-10-CM | POA: Diagnosis not present

## 2016-08-10 DIAGNOSIS — D689 Coagulation defect, unspecified: Secondary | ICD-10-CM | POA: Diagnosis not present

## 2016-08-10 DIAGNOSIS — N186 End stage renal disease: Secondary | ICD-10-CM | POA: Diagnosis not present

## 2016-08-12 ENCOUNTER — Other Ambulatory Visit: Payer: Self-pay | Admitting: Endocrinology

## 2016-08-13 DIAGNOSIS — N2581 Secondary hyperparathyroidism of renal origin: Secondary | ICD-10-CM | POA: Diagnosis not present

## 2016-08-13 DIAGNOSIS — D689 Coagulation defect, unspecified: Secondary | ICD-10-CM | POA: Diagnosis not present

## 2016-08-13 DIAGNOSIS — N186 End stage renal disease: Secondary | ICD-10-CM | POA: Diagnosis not present

## 2016-08-14 DIAGNOSIS — N10 Acute tubulo-interstitial nephritis: Secondary | ICD-10-CM | POA: Diagnosis not present

## 2016-08-14 DIAGNOSIS — N186 End stage renal disease: Secondary | ICD-10-CM | POA: Diagnosis not present

## 2016-08-14 DIAGNOSIS — Z992 Dependence on renal dialysis: Secondary | ICD-10-CM | POA: Diagnosis not present

## 2016-08-15 DIAGNOSIS — D631 Anemia in chronic kidney disease: Secondary | ICD-10-CM | POA: Diagnosis not present

## 2016-08-15 DIAGNOSIS — D689 Coagulation defect, unspecified: Secondary | ICD-10-CM | POA: Diagnosis not present

## 2016-08-15 DIAGNOSIS — N186 End stage renal disease: Secondary | ICD-10-CM | POA: Diagnosis not present

## 2016-08-15 DIAGNOSIS — E1121 Type 2 diabetes mellitus with diabetic nephropathy: Secondary | ICD-10-CM | POA: Diagnosis not present

## 2016-08-15 DIAGNOSIS — N2581 Secondary hyperparathyroidism of renal origin: Secondary | ICD-10-CM | POA: Diagnosis not present

## 2016-08-15 DIAGNOSIS — Z992 Dependence on renal dialysis: Secondary | ICD-10-CM | POA: Diagnosis not present

## 2016-08-16 ENCOUNTER — Other Ambulatory Visit: Payer: Self-pay | Admitting: Endocrinology

## 2016-08-19 ENCOUNTER — Other Ambulatory Visit: Payer: Self-pay | Admitting: Endocrinology

## 2016-09-02 ENCOUNTER — Other Ambulatory Visit: Payer: Self-pay | Admitting: Internal Medicine

## 2016-09-13 ENCOUNTER — Other Ambulatory Visit: Payer: Self-pay | Admitting: Endocrinology

## 2016-09-13 DIAGNOSIS — R69 Illness, unspecified: Secondary | ICD-10-CM | POA: Diagnosis not present

## 2016-09-13 NOTE — Telephone Encounter (Signed)
Please refill colestipol prn Please refer plavix refill to cardiol

## 2016-09-14 DIAGNOSIS — N10 Acute tubulo-interstitial nephritis: Secondary | ICD-10-CM | POA: Diagnosis not present

## 2016-09-14 DIAGNOSIS — N186 End stage renal disease: Secondary | ICD-10-CM | POA: Diagnosis not present

## 2016-09-14 DIAGNOSIS — Z992 Dependence on renal dialysis: Secondary | ICD-10-CM | POA: Diagnosis not present

## 2016-09-16 ENCOUNTER — Encounter: Payer: Self-pay | Admitting: Pulmonary Disease

## 2016-09-16 ENCOUNTER — Ambulatory Visit (INDEPENDENT_AMBULATORY_CARE_PROVIDER_SITE_OTHER): Payer: Medicare HMO | Admitting: Pulmonary Disease

## 2016-09-16 DIAGNOSIS — G4733 Obstructive sleep apnea (adult) (pediatric): Secondary | ICD-10-CM

## 2016-09-16 NOTE — Patient Instructions (Signed)
Return to clinic in as needed.   It was a pleasure seeing you today.

## 2016-09-16 NOTE — Assessment & Plan Note (Signed)
Patient has severe OSA based on a sleep study in 2002 with an AHI of 77. She has always struggled with CPAP use. CPAP made it difficult for her to sleep at night. She was diagnosed with end-stage renal disease a year ago and has been on hemodialysis since that time. She stopped using her CPAP since being on hemodialysis. Overall, she is not significantly worse off the CPAP machine. For the most part, she feels rested when she wakes up in the morning. Has not been hospitalized nor had any recent infection last year.  Plan : 1. There was no point arguing with the patient today as she has not been using CPAP for a year  now and clinically is not worse. She feels for the most part refreshed when she wakes up in the morning. Her comorbidities have been stable as well.  I just told the patient and her husband to watch out for worsening of her OSA symptoms. I also mentioned that if her comorbidities end up being difficult to control or if she has frequent admissions to the hospital, at that point, we may need to revisit sleep apnea and CPAP use. They understand and they agreed to the plan. 2.  We can continue to hold off on cpap for now.  Pt has actually held off using cpap x 1 year now.

## 2016-09-16 NOTE — Progress Notes (Signed)
Subjective:    Patient ID: [name], female    DOB: 04/25/1930, 83 y.o.   MRN: [id number]  HPI  Patient is being seen in the office for her severe sleep apnea.  Based on sleep study in 2002, her AHI was 77. She has been on CPAP for many years but has always struggled with using it. She has not been using the CPAP the last year since being placed on hemodialysis. She felt CPAP made it  difficult for her to breathe at night. She struggled with cpap.   Since being off the CPAP, she does not necessarily feel worse. For the most part, she feels refreshed when she wakes up in the morning. Denies hypersomnia. She has occasional fatigue in the morning. Her husband accompanied her today but they do not sleep in the same bed. Not sure she still has witnessed apneas or snoring.  She has been on hemodialysis for a year now.  She has not been admitted nor in on antibiotics at least the last year.  Review of Systems  Constitutional: Negative.   HENT: Negative.   Eyes: Negative.   Respiratory: Positive for shortness of breath.   Cardiovascular: Negative.   Gastrointestinal: Negative.   Endocrine: Negative.   Genitourinary: Negative.   Musculoskeletal: Negative.   Skin: Negative.   All other systems reviewed and are negative.      Objective:   Physical Exam   Vitals:  Vitals:   09/16/16 1137  BP: 140/72  Pulse: 67  SpO2: 94%  Weight: 156 lb 6.4 oz (70.9 kg)  Height: 5\' 6"  (1.676 m)    Constitutional/General:  Pleasant, well-nourished, well-developed, Chronically ill, not in any distress,  Comfortably seating.  Well kempt  Body mass index is 25.24 kg/m. Wt Readings from Last 3 Encounters:  09/16/16 156 lb 6.4 oz (70.9 kg)  07/23/16 159 lb (72.1 kg)  07/08/16 157 lb (71.2 kg)      HEENT: Pupils equal and reactive to light and accommodation. Anicteric sclerae. Normal nasal mucosa.   No oral  lesions,  mouth clear,  oropharynx clear, no postnasal drip. (-) Oral  thrush. No dental caries.  Airway - Mallampati class III  Neck: No masses. Midline trachea. No JVD, (-) LAD. (-) bruits appreciated.  Respiratory/Chest: Grossly normal chest. (-) deformity. (-) Accessory muscle use.  Symmetric expansion. (-) Tenderness on palpation.  Resonant on percussion.  Diminished BS on both lower lung zones. (-) wheezing, rhonchi Some crackles at the bases.  (-) egophony  Cardiovascular: Regular rate and  rhythm, heart sounds normal, no murmur or gallops, no peripheral edema  Gastrointestinal:  Normal bowel sounds. Soft, non-tender. No hepatosplenomegaly.  (-) masses.   Musculoskeletal:  Normal muscle tone. Normal gait.   Extremities: Grossly normal. (-) clubbing, cyanosis.  (-) edema  Skin: (-) rash,lesions seen.   Neurological/Psychiatric : alert, oriented to time, place, person. Normal mood and affect         Assessment & Plan:  OSA (obstructive sleep apnea) Patient has severe OSA based on a sleep study in 2002 with an AHI of 77. She has always struggled with CPAP use. CPAP made it difficult for her to sleep at night. She was diagnosed with end-stage renal disease a year ago and has been on hemodialysis since that time. She stopped using her CPAP since being on hemodialysis. Overall, she is not significantly worse off the CPAP machine. For the most part, she feels rested when she wakes up in the morning.  Has not been hospitalized nor had any recent infection last year.  Plan : 1. There was no point arguing with the patient today as she has not been using CPAP for a year  now and clinically is not worse. She feels for the most part refreshed when she wakes up in the morning. Her comorbidities have been stable as well.  I just told the patient and her husband to watch out for worsening of her OSA symptoms. I also mentioned that if her comorbidities end up being difficult to control or if she has frequent admissions to the hospital, at that point, we may  need to revisit sleep apnea and CPAP use. They understand and they agreed to the plan. 2.  We can continue to hold off on cpap for now.  Pt has actually held off using cpap x 1 year now.   Return to clinic as needed.   Monica Becton, MD 09/16/2016, 12:54 PM [facility] Pager [phone] After 3 pm or if no answer, call [phone]

## 2016-09-17 DIAGNOSIS — N2581 Secondary hyperparathyroidism of renal origin: Secondary | ICD-10-CM | POA: Diagnosis not present

## 2016-09-17 DIAGNOSIS — N186 End stage renal disease: Secondary | ICD-10-CM | POA: Diagnosis not present

## 2016-09-17 DIAGNOSIS — D689 Coagulation defect, unspecified: Secondary | ICD-10-CM | POA: Diagnosis not present

## 2016-09-17 DIAGNOSIS — E1121 Type 2 diabetes mellitus with diabetic nephropathy: Secondary | ICD-10-CM | POA: Diagnosis not present

## 2016-09-19 DIAGNOSIS — N186 End stage renal disease: Secondary | ICD-10-CM | POA: Diagnosis not present

## 2016-09-19 DIAGNOSIS — E1121 Type 2 diabetes mellitus with diabetic nephropathy: Secondary | ICD-10-CM | POA: Diagnosis not present

## 2016-09-19 DIAGNOSIS — D689 Coagulation defect, unspecified: Secondary | ICD-10-CM | POA: Diagnosis not present

## 2016-09-19 DIAGNOSIS — N2581 Secondary hyperparathyroidism of renal origin: Secondary | ICD-10-CM | POA: Diagnosis not present

## 2016-09-21 DIAGNOSIS — N2581 Secondary hyperparathyroidism of renal origin: Secondary | ICD-10-CM | POA: Diagnosis not present

## 2016-09-21 DIAGNOSIS — D689 Coagulation defect, unspecified: Secondary | ICD-10-CM | POA: Diagnosis not present

## 2016-09-21 DIAGNOSIS — N186 End stage renal disease: Secondary | ICD-10-CM | POA: Diagnosis not present

## 2016-09-21 DIAGNOSIS — E1121 Type 2 diabetes mellitus with diabetic nephropathy: Secondary | ICD-10-CM | POA: Diagnosis not present

## 2016-09-24 DIAGNOSIS — N186 End stage renal disease: Secondary | ICD-10-CM | POA: Diagnosis not present

## 2016-09-24 DIAGNOSIS — E1121 Type 2 diabetes mellitus with diabetic nephropathy: Secondary | ICD-10-CM | POA: Diagnosis not present

## 2016-09-24 DIAGNOSIS — D689 Coagulation defect, unspecified: Secondary | ICD-10-CM | POA: Diagnosis not present

## 2016-09-24 DIAGNOSIS — N2581 Secondary hyperparathyroidism of renal origin: Secondary | ICD-10-CM | POA: Diagnosis not present

## 2016-09-25 ENCOUNTER — Telehealth: Payer: Self-pay | Admitting: Endocrinology

## 2016-09-25 NOTE — Telephone Encounter (Signed)
Please forward request to cardiol

## 2016-09-25 NOTE — Telephone Encounter (Signed)
See message. Ok to refill? 

## 2016-09-25 NOTE — Telephone Encounter (Signed)
Refill of  clopidogrel (PLAVIX) 75 MG tablet  [address] [place], [place] - [place] [phone] (Phone) [phone] (Fax)

## 2016-09-25 NOTE — Telephone Encounter (Signed)
Patient notified to contact her cardiologist for her Plavix refill.

## 2016-09-26 DIAGNOSIS — N186 End stage renal disease: Secondary | ICD-10-CM | POA: Diagnosis not present

## 2016-09-26 DIAGNOSIS — D689 Coagulation defect, unspecified: Secondary | ICD-10-CM | POA: Diagnosis not present

## 2016-09-26 DIAGNOSIS — N2581 Secondary hyperparathyroidism of renal origin: Secondary | ICD-10-CM | POA: Diagnosis not present

## 2016-09-26 DIAGNOSIS — E1121 Type 2 diabetes mellitus with diabetic nephropathy: Secondary | ICD-10-CM | POA: Diagnosis not present

## 2016-09-28 DIAGNOSIS — E1121 Type 2 diabetes mellitus with diabetic nephropathy: Secondary | ICD-10-CM | POA: Diagnosis not present

## 2016-09-28 DIAGNOSIS — N2581 Secondary hyperparathyroidism of renal origin: Secondary | ICD-10-CM | POA: Diagnosis not present

## 2016-09-28 DIAGNOSIS — N186 End stage renal disease: Secondary | ICD-10-CM | POA: Diagnosis not present

## 2016-09-28 DIAGNOSIS — D689 Coagulation defect, unspecified: Secondary | ICD-10-CM | POA: Diagnosis not present

## 2016-10-01 DIAGNOSIS — D689 Coagulation defect, unspecified: Secondary | ICD-10-CM | POA: Diagnosis not present

## 2016-10-01 DIAGNOSIS — N186 End stage renal disease: Secondary | ICD-10-CM | POA: Diagnosis not present

## 2016-10-01 DIAGNOSIS — N2581 Secondary hyperparathyroidism of renal origin: Secondary | ICD-10-CM | POA: Diagnosis not present

## 2016-10-01 DIAGNOSIS — E1121 Type 2 diabetes mellitus with diabetic nephropathy: Secondary | ICD-10-CM | POA: Diagnosis not present

## 2016-10-03 DIAGNOSIS — D689 Coagulation defect, unspecified: Secondary | ICD-10-CM | POA: Diagnosis not present

## 2016-10-03 DIAGNOSIS — N2581 Secondary hyperparathyroidism of renal origin: Secondary | ICD-10-CM | POA: Diagnosis not present

## 2016-10-03 DIAGNOSIS — N186 End stage renal disease: Secondary | ICD-10-CM | POA: Diagnosis not present

## 2016-10-03 DIAGNOSIS — E1121 Type 2 diabetes mellitus with diabetic nephropathy: Secondary | ICD-10-CM | POA: Diagnosis not present

## 2016-10-04 ENCOUNTER — Other Ambulatory Visit: Payer: Self-pay | Admitting: *Deleted

## 2016-10-04 DIAGNOSIS — I871 Compression of vein: Secondary | ICD-10-CM | POA: Diagnosis not present

## 2016-10-04 DIAGNOSIS — Z992 Dependence on renal dialysis: Secondary | ICD-10-CM | POA: Diagnosis not present

## 2016-10-04 DIAGNOSIS — T82858A Stenosis of vascular prosthetic devices, implants and grafts, initial encounter: Secondary | ICD-10-CM | POA: Diagnosis not present

## 2016-10-04 DIAGNOSIS — N186 End stage renal disease: Secondary | ICD-10-CM | POA: Diagnosis not present

## 2016-10-04 MED ORDER — CLOPIDOGREL BISULFATE 75 MG PO TABS: 75.0000 mg | ORAL_TABLET | Freq: Every day | ORAL | 4 refills | Status: AC

## 2016-10-04 NOTE — Telephone Encounter (Signed)
Patients daughter called to request a refill on clopidogrel for the patient from Dr Meda Coffee. She stated that patients PCP, Dr Loanne Drilling has been refilling this but has denied to refill it any further. I do not see where Dr Meda Coffee has ever filled this for the patient. Okay to refill? Please advise. Thanks, MI

## 2016-10-05 ENCOUNTER — Other Ambulatory Visit: Payer: Self-pay | Admitting: Endocrinology

## 2016-10-05 DIAGNOSIS — N2581 Secondary hyperparathyroidism of renal origin: Secondary | ICD-10-CM | POA: Diagnosis not present

## 2016-10-05 DIAGNOSIS — E1121 Type 2 diabetes mellitus with diabetic nephropathy: Secondary | ICD-10-CM | POA: Diagnosis not present

## 2016-10-05 DIAGNOSIS — D689 Coagulation defect, unspecified: Secondary | ICD-10-CM | POA: Diagnosis not present

## 2016-10-05 DIAGNOSIS — N186 End stage renal disease: Secondary | ICD-10-CM | POA: Diagnosis not present

## 2016-10-08 DIAGNOSIS — D689 Coagulation defect, unspecified: Secondary | ICD-10-CM | POA: Diagnosis not present

## 2016-10-08 DIAGNOSIS — N2581 Secondary hyperparathyroidism of renal origin: Secondary | ICD-10-CM | POA: Diagnosis not present

## 2016-10-08 DIAGNOSIS — N186 End stage renal disease: Secondary | ICD-10-CM | POA: Diagnosis not present

## 2016-10-08 DIAGNOSIS — E1121 Type 2 diabetes mellitus with diabetic nephropathy: Secondary | ICD-10-CM | POA: Diagnosis not present

## 2016-10-10 ENCOUNTER — Other Ambulatory Visit: Payer: Self-pay | Admitting: Internal Medicine

## 2016-10-10 DIAGNOSIS — N186 End stage renal disease: Secondary | ICD-10-CM | POA: Diagnosis not present

## 2016-10-10 DIAGNOSIS — D689 Coagulation defect, unspecified: Secondary | ICD-10-CM | POA: Diagnosis not present

## 2016-10-10 DIAGNOSIS — N2581 Secondary hyperparathyroidism of renal origin: Secondary | ICD-10-CM | POA: Diagnosis not present

## 2016-10-10 DIAGNOSIS — E1121 Type 2 diabetes mellitus with diabetic nephropathy: Secondary | ICD-10-CM | POA: Diagnosis not present

## 2016-10-11 ENCOUNTER — Telehealth: Payer: Self-pay | Admitting: Endocrinology

## 2016-10-11 MED ORDER — PANTOPRAZOLE SODIUM 40 MG PO TBEC: DELAYED_RELEASE_TABLET | ORAL | 2 refills | Status: DC

## 2016-10-11 MED ORDER — COLESTIPOL HCL 1 G PO TABS: ORAL_TABLET | ORAL | 1 refills | Status: DC

## 2016-10-11 NOTE — Telephone Encounter (Signed)
Refill of pantoprazole (PROTONIX) 40 MG tablet   [facility] [address]  [phone]

## 2016-10-11 NOTE — Telephone Encounter (Signed)
Refill submitted. 

## 2016-10-11 NOTE — Telephone Encounter (Signed)
Pt's daughter called in and needs her Colestipol refilled and sent to the [facility] on [address].

## 2016-10-12 DIAGNOSIS — D689 Coagulation defect, unspecified: Secondary | ICD-10-CM | POA: Diagnosis not present

## 2016-10-12 DIAGNOSIS — N2581 Secondary hyperparathyroidism of renal origin: Secondary | ICD-10-CM | POA: Diagnosis not present

## 2016-10-12 DIAGNOSIS — E1121 Type 2 diabetes mellitus with diabetic nephropathy: Secondary | ICD-10-CM | POA: Diagnosis not present

## 2016-10-12 DIAGNOSIS — N186 End stage renal disease: Secondary | ICD-10-CM | POA: Diagnosis not present

## 2016-10-14 DIAGNOSIS — N186 End stage renal disease: Secondary | ICD-10-CM | POA: Diagnosis not present

## 2016-10-14 DIAGNOSIS — N10 Acute tubulo-interstitial nephritis: Secondary | ICD-10-CM | POA: Diagnosis not present

## 2016-10-14 DIAGNOSIS — Z992 Dependence on renal dialysis: Secondary | ICD-10-CM | POA: Diagnosis not present

## 2016-10-14 DIAGNOSIS — Z0120 Encounter for dental examination and cleaning without abnormal findings: Secondary | ICD-10-CM | POA: Diagnosis not present

## 2016-10-15 DIAGNOSIS — R52 Pain, unspecified: Secondary | ICD-10-CM | POA: Diagnosis not present

## 2016-10-15 DIAGNOSIS — N2581 Secondary hyperparathyroidism of renal origin: Secondary | ICD-10-CM | POA: Diagnosis not present

## 2016-10-15 DIAGNOSIS — N186 End stage renal disease: Secondary | ICD-10-CM | POA: Diagnosis not present

## 2016-10-15 DIAGNOSIS — D689 Coagulation defect, unspecified: Secondary | ICD-10-CM | POA: Diagnosis not present

## 2016-10-15 DIAGNOSIS — R509 Fever, unspecified: Secondary | ICD-10-CM | POA: Diagnosis not present

## 2016-10-15 DIAGNOSIS — E1121 Type 2 diabetes mellitus with diabetic nephropathy: Secondary | ICD-10-CM | POA: Diagnosis not present

## 2016-10-15 DIAGNOSIS — D631 Anemia in chronic kidney disease: Secondary | ICD-10-CM | POA: Diagnosis not present

## 2016-10-15 DIAGNOSIS — D509 Iron deficiency anemia, unspecified: Secondary | ICD-10-CM | POA: Diagnosis not present

## 2016-10-18 ENCOUNTER — Encounter: Payer: Self-pay | Admitting: Endocrinology

## 2016-10-18 ENCOUNTER — Ambulatory Visit
Admission: RE | Admit: 2016-10-18 | Discharge: 2016-10-18 | Disposition: A | Discharge status: Home / Self Care | Payer: Medicare HMO | Source: Ambulatory Visit | Attending: Endocrinology | Admitting: Endocrinology

## 2016-10-18 ENCOUNTER — Ambulatory Visit (INDEPENDENT_AMBULATORY_CARE_PROVIDER_SITE_OTHER): Payer: Medicare HMO | Admitting: Endocrinology

## 2016-10-18 VITALS — BP 136/70 | HR 76 | Ht 66.0 in | Wt 155.0 lb

## 2016-10-18 DIAGNOSIS — M25512 Pain in left shoulder: Principal | ICD-10-CM

## 2016-10-18 DIAGNOSIS — G8929 Other chronic pain: Secondary | ICD-10-CM

## 2016-10-18 DIAGNOSIS — E1122 Type 2 diabetes mellitus with diabetic chronic kidney disease: Secondary | ICD-10-CM

## 2016-10-18 DIAGNOSIS — N184 Chronic kidney disease, stage 4 (severe): Secondary | ICD-10-CM

## 2016-10-18 DIAGNOSIS — Z794 Long term (current) use of insulin: Secondary | ICD-10-CM | POA: Diagnosis not present

## 2016-10-18 DIAGNOSIS — M19012 Primary osteoarthritis, left shoulder: Secondary | ICD-10-CM | POA: Diagnosis not present

## 2016-10-18 DIAGNOSIS — M25519 Pain in unspecified shoulder: Secondary | ICD-10-CM | POA: Insufficient documentation

## 2016-10-18 LAB — POCT GLYCOSYLATED HEMOGLOBIN (HGB A1C): Hemoglobin A1C: 8

## 2016-10-18 MED ORDER — REPAGLINIDE 1 MG PO TABS: 1.0000 mg | ORAL_TABLET | Freq: Two times a day (BID) | ORAL | 11 refills | Status: DC

## 2016-10-18 NOTE — Progress Notes (Signed)
Subjective:    Patient ID: [name], female    DOB: [birth year], 83 y.o.   MRN: [id number]  HPI Pt returns for f/u of diabetes mellitus: DM type: Insulin-requiring type 2 Dx'ed: 8882 Complications: polyneuropathy, ESRD, CAD, and PAD.  Therapy: insulin since 1991 GDM: never DKA: never Severe hypoglycemia: last episode was in 2014.  Pancreatitis: never Other: she took insulin 1991-2018.  Interval history: Husband gives her 10-15 units of insulin, approx 2-3 times per week.  pt states she feels well in general.   She has few weeks of moderate pain at the left shoulder, but no assoc numbness.   Past Medical History:  Diagnosis Date  . Arthritis   . Asthma    Mild  . Bradycardia    a. Not on BB due to this.  Marland Kitchen CAD (coronary artery disease)    a. s/p multiple caths with nonobs CAD;   b. cath 1/10: pLAD 20%, mLAD 40%, pCFX 20%, mCFX 40%, pRCA 60-70%;   c.  Myoview 06/02/12: Low anterior wall scar, no ischemia, EF 37%. d. cath 06/20/2014 70% mid LCx, medical therapy, high risk for PCI due to close proximity to large OM e. cath 03/07/15 pro RCA 50% & pro to mid Cx 75%, both stable, LVEDP 33-50mm Hg-> medical management  . Cardiomyopathy ([place])    a. Echo 05/31/12: EF 40-45%. b. Echo 06/2014: EF 50-55%. c. Cath 02/2015: EF 45-50%.  . Chronic combined systolic and diastolic CHF (congestive heart failure) (HCC)    a. EF 50-55%, mild LVH and grade 1 diast. Dysfxn b. Grade 3 Diastolic Dysfunction 80/0349. b. 02/2015: EF 45-50% by cath.  . CKD (chronic kidney disease), stage III    Dr. Lorrene Reid,    dialyzes Tuesday, Thursday, Saturday  . COPD (chronic obstructive pulmonary disease) ([place])   . Depression   . Diabetes mellitus    type II; peripheral neuropathy  . Diverticulosis   . DM retinopathy ([place])   . Dyslipidemia   . GERD (gastroesophageal reflux disease)   . Hiatal hernia   . History of shingles   . Hypertension   . Noncompliance   . Obesity   . Osteoarthritis cervical spine     . Peptic stricture of esophagus   . Peptic ulcer disease    duodenal  . Peripheral vascular disease ([place])    a. s/p L BKA 08/2012.  Marland Kitchen Pruritic condition    Idiopathic  . Shortness of breath dyspnea   . Sleep apnea    with CPAP  . Uterine cancer ([place])   . Zoster     Past Surgical History:  Procedure Laterality Date  . ABDOMINAL ANGIOGRAM N/A 08/31/2012   Procedure: ABDOMINAL ANGIOGRAM with runoff poss intervention;  Surgeon: Angelia Mould, MD;  Location: [facility] CATH LAB;  Service: Cardiovascular;  Laterality: N/A;  . AMPUTATION Left 09/04/2012   Procedure: AMPUTATION BELOW KNEE;  Surgeon: Angelia Mould, MD;  Location: [facility];  Service: Vascular;  Laterality: Left;  . AV FISTULA PLACEMENT Left 05/10/2015   Procedure: CREATION OF LEFT UPPER ARM ARTERIOVENOUS (AV) FISTULA ;  Surgeon: Elam Dutch, MD;  Location: [facility];  Service: Vascular;  Laterality: Left;  . BRAIN SURGERY     1997 blood clot on the brain, then had to relieve fluid on the brain  . CARDIAC CATHETERIZATION    . CARDIAC CATHETERIZATION N/A 03/07/2015   Procedure: Left Heart Cath and Coronary Angiography;  Surgeon: Leonie Man, MD;  Location: [facility] CV  LAB;  Service: Cardiovascular;  Laterality: N/A;  . CATARACT EXTRACTION, BILATERAL  2005  . CHOLECYSTECTOMY  2010  . CRANIOTOMY  1997   Left for SDH  . DEXA  7/05  . ESOPHAGOGASTRODUODENOSCOPY  04/04/2004  . EYE SURGERY    . HERNIA REPAIR    . KNEE ARTHROSCOPY  10/1998   Left  . LEFT HEART CATHETERIZATION WITH CORONARY ANGIOGRAM N/A 06/22/2014   Procedure: LEFT HEART CATHETERIZATION WITH CORONARY ANGIOGRAM;  Surgeon: Burnell Blanks, MD;  Location: [facility] CATH LAB;  Service: Cardiovascular;  Laterality: N/A;  . MULTIPLE EXTRACTIONS WITH ALVEOLOPLASTY N/A 08/01/2014   Procedure: EXTRACTIONS OF TEETH NUMBERS 7 8 9 10 11  AND 19 AND ALVEOLOPLASTY UPPER LEFT AND RIGHT QUADRANT;  Surgeon: Isac Caddy, DDS;  Location: [facility];  Service: Oral  Surgery;  Laterality: N/A;  . SPINE SURGERY     C-spine and lumbar surgery  . TUBAL LIGATION  1967  . TUBAL LIGATION      Social History   Social History  . Marital status: Married    Spouse name: N/A  . Number of children: 7  . Years of education: N/A   Occupational History  . Retired Retired   Social History Main Topics  . Smoking status: Never Smoker  . Smokeless tobacco: Never Used  . Alcohol use No     Comment: rare  . Drug use: No  . Sexual activity: No   Other Topics Concern  . Not on file   Social History Narrative  . No narrative on file    Current Outpatient Prescriptions on File Prior to Visit  Medication Sig Dispense Refill  . acetaminophen (TYLENOL) 500 MG tablet Take 500 mg by mouth every 6 (six) hours as needed for mild pain.    Marland Kitchen albuterol (PROVENTIL HFA;VENTOLIN HFA) 108 (90 BASE) MCG/ACT inhaler Inhale 2 puffs into the lungs every 4 (four) hours as needed for wheezing or shortness of breath. 1 Inhaler 3  . allopurinol (ZYLOPRIM) 100 MG tablet TAKE 1 TABLET(100 MG) BY MOUTH DAILY 90 tablet 0  . aspirin EC 81 MG tablet Take 1 tablet (81 mg total) by mouth daily. 90 tablet 3  . atorvastatin (LIPITOR) 40 MG tablet TAKE 1 TABLET BY MOUTH DAILY AT 6 PM 90 tablet 0  . clopidogrel (PLAVIX) 75 MG tablet Take 1 tablet (75 mg total) by mouth daily. 90 tablet 4  . colestipol (COLESTID) 1 g tablet TAKE 3 TABLETS(3 GRAMS) BY MOUTH DAILY 90 tablet 1  . donepezil (ARICEPT) 5 MG tablet TAKE 1 TABLET(5 MG) BY MOUTH AT BEDTIME 90 tablet 0  . ezetimibe (ZETIA) 10 MG tablet TAKE 1 TABLET BY MOUTH EVERY MORNING 90 tablet 0  . feeding supplement (GLUCERNA SHAKE) LIQD Take 237 mLs by mouth every morning.     . Fluticasone-Salmeterol (ADVAIR DISKUS) 100-50 MCG/DOSE AEPB Inhale 1 puff into the lungs 2 (two) times daily. 1 each 11  . gabapentin (NEURONTIN) 100 MG capsule TAKE 1 CAPSULE BY MOUTH EVERY NIGHT AT BEDTIME 90 capsule 3  . hydrALAZINE (APRESOLINE) 25 MG tablet TAKE 1  TABLET(25 MG) BY MOUTH THREE TIMES DAILY 270 tablet 0  . isosorbide mononitrate (IMDUR) 60 MG 24 hr tablet TAKE 1 TABLET BY MOUTH DAILY 90 tablet 2  . latanoprost (XALATAN) 0.005 % ophthalmic solution Place 1 drop into both eyes at bedtime.    . memantine (NAMENDA) 5 MG tablet Take 1 tablet (5 mg total) by mouth 2 (two) times daily. 60 tablet 11  .  nitroGLYCERIN (NITROSTAT) 0.4 MG SL tablet Place 0.4 mg under the tongue every 5 (five) minutes as needed for chest pain.     . ONE TOUCH ULTRA TEST test strip USE TO TEST BLOOD SUGAR TWICE DAILY 100 each 2  . pantoprazole (PROTONIX) 40 MG tablet TAKE 1 TABLET BY MOUTH TWICE DAILY 60 tablet 2  . RANEXA 500 MG 12 hr tablet TAKE 1 TABLET BY MOUTH EVERY 12 HOURS 60 tablet 1  . sertraline (ZOLOFT) 100 MG tablet TAKE 1 TABLET(100 MG) BY MOUTH DAILY 90 tablet 0   No current facility-administered medications on file prior to visit.     Allergies  Allergen Reactions  . Iohexol Anaphylaxis, Itching and Rash     Code: RASH, Desc: [place] ON PT'S CHART ALLERGIC TO IV DYE 09/04/07/RM, Onset Date: [id number]     Family History  Problem Relation Age of Onset  . Cancer - Other Mother     "Stomach" Cancer  . Diabetes Mother   . Heart disease Mother   . Stomach cancer Mother   . Hypertension Mother   . Rheum arthritis Mother   . Lymphoma Father   . Hypertension Father   . Rheum arthritis Father   . Kidney disease Paternal Grandmother   . Stroke Paternal Grandmother   . Asthma Other   . Diabetes Sister   . Heart attack Neg Hx     BP 136/70   Pulse 76   Ht 5\' 6"  (1.676 m)   Wt 155 lb (70.3 kg)   SpO2 90%   BMI 25.02 kg/m    Review of Systems She denies hypoglycemia and rash.      Objective:   Physical Exam VITAL SIGNS:  See vs page.   GENERAL: no distress.  In wheelchair.   LLE: BKA.   Right foot: no deformity.  There is bilateral onychomycosis.  Skin: no ulcer, and normal color and temp, on the right foot.  Neuro: sensation  is intact to touch on the right foot.  CV: 1+ right leg edema.  MSK: full ROM of the left shoulder, but ROM is painful.  Left shoulder: nontender.   a1c=8.0%    Assessment & Plan:  Insulin-requiring type 2 DM: she needs increased rx, if it can be done with a regimen that avoids or minimizes hypoglycemia.  shoulder pain, new, uncertain etiology.   Patient Instructions  I have sent a prescription to your pharmacy, to double the repaglinide.  This way, you won't have to take the insulin.   check your blood sugar once a day.  vary the time of day when you check, between before the 3 meals, and at bedtime.  also check if you have symptoms of your blood sugar being too high or too low.  please keep a record of the readings and bring it to your next appointment here (or you can bring the meter itself).  You can write it on any piece of paper.  please call us sooner if your blood sugar goes below 70, or if you have a lot of readings over 200.   Please come back for a follow-up appointment in 4 months.

## 2016-10-18 NOTE — Patient Instructions (Addendum)
I have sent a prescription to your pharmacy, to double the repaglinide.  This way, you won't have to take the insulin.   check your blood sugar once a day.  vary the time of day when you check, between before the 3 meals, and at bedtime.  also check if you have symptoms of your blood sugar being too high or too low.  please keep a record of the readings and bring it to your next appointment here (or you can bring the meter itself).  You can write it on any piece of paper.  please call us sooner if your blood sugar goes below 70, or if you have a lot of readings over 200.   Please come back for a follow-up appointment in 4 months.

## 2016-10-21 ENCOUNTER — Other Ambulatory Visit: Payer: Self-pay | Admitting: Endocrinology

## 2016-10-21 ENCOUNTER — Telehealth: Payer: Self-pay | Admitting: Endocrinology

## 2016-10-21 NOTE — Telephone Encounter (Signed)
[facility] Call: Caller states needs Rx sent to pharmacy for Sertraline 100MG  tablets.  She has 6 pills left and just wanted to call for a refill.  Mother will be out of medication on Thursday night.

## 2016-10-21 NOTE — Telephone Encounter (Signed)
Refill submitted. 

## 2016-10-23 ENCOUNTER — Telehealth: Payer: Self-pay | Admitting: Endocrinology

## 2016-10-23 NOTE — Telephone Encounter (Signed)
Pt is aware of the imaging results, she wants to hold off on being referred to an arthritis specialist right now

## 2016-10-25 ENCOUNTER — Telehealth: Payer: Self-pay | Admitting: Endocrinology

## 2016-10-25 NOTE — Telephone Encounter (Signed)
Patient would like medication sertraline (ZOLOFT) 100 MG tablet  sent to   [place], [place] [phone] (Phone) [phone] (Fax)

## 2016-10-28 ENCOUNTER — Other Ambulatory Visit: Payer: Self-pay

## 2016-10-28 MED ORDER — SERTRALINE HCL 100 MG PO TABS: ORAL_TABLET | ORAL | 0 refills | Status: DC

## 2016-10-28 NOTE — Telephone Encounter (Signed)
Ordered

## 2016-10-31 ENCOUNTER — Other Ambulatory Visit: Payer: Self-pay | Admitting: Endocrinology

## 2016-11-04 ENCOUNTER — Telehealth: Payer: Self-pay | Admitting: Endocrinology

## 2016-11-04 MED ORDER — EZETIMIBE 10 MG PO TABS: 10.0000 mg | ORAL_TABLET | Freq: Every morning | ORAL | 0 refills | Status: DC

## 2016-11-04 NOTE — Telephone Encounter (Signed)
Patient need refill of medication Ezetimibe    [place], [place] - [place] [phone] (Phone) [phone] (Fax)

## 2016-11-04 NOTE — Telephone Encounter (Signed)
Refill submitted. 

## 2016-11-04 NOTE — Telephone Encounter (Signed)
[name] Self [phone]  Linsie called to say she needed some refills on her medicine, but she was not sure which ones, her daughter handles these for her and told her they had a message to call in. I see where a couple of her prescriptions were sent in last week, She is going to call daughter to confirm what she needs.

## 2016-11-08 ENCOUNTER — Telehealth: Payer: Self-pay

## 2016-11-08 NOTE — Telephone Encounter (Signed)
Annual wellness visit needed for this patient as well. If scheduled, will you let me know when it is scheduled.

## 2016-11-08 NOTE — Telephone Encounter (Signed)
Will address next time

## 2016-11-09 DIAGNOSIS — R69 Illness, unspecified: Secondary | ICD-10-CM | POA: Diagnosis not present

## 2016-11-14 ENCOUNTER — Emergency Department (HOSPITAL_COMMUNITY): Payer: Medicare HMO

## 2016-11-14 ENCOUNTER — Encounter (HOSPITAL_COMMUNITY): Payer: Self-pay | Admitting: Emergency Medicine

## 2016-11-14 ENCOUNTER — Emergency Department (HOSPITAL_COMMUNITY)
Admission: EM | Admit: 2016-11-14 | Discharge: 2016-11-14 | Disposition: A | Discharge status: Home / Self Care | Payer: Medicare HMO | Attending: Emergency Medicine | Admitting: Emergency Medicine

## 2016-11-14 DIAGNOSIS — Z992 Dependence on renal dialysis: Secondary | ICD-10-CM | POA: Diagnosis not present

## 2016-11-14 DIAGNOSIS — E1121 Type 2 diabetes mellitus with diabetic nephropathy: Secondary | ICD-10-CM | POA: Diagnosis not present

## 2016-11-14 DIAGNOSIS — Z7902 Long term (current) use of antithrombotics/antiplatelets: Secondary | ICD-10-CM | POA: Diagnosis not present

## 2016-11-14 DIAGNOSIS — D631 Anemia in chronic kidney disease: Secondary | ICD-10-CM | POA: Diagnosis not present

## 2016-11-14 DIAGNOSIS — N183 Chronic kidney disease, stage 3 (moderate): Secondary | ICD-10-CM | POA: Insufficient documentation

## 2016-11-14 DIAGNOSIS — N10 Acute tubulo-interstitial nephritis: Secondary | ICD-10-CM | POA: Diagnosis not present

## 2016-11-14 DIAGNOSIS — J449 Chronic obstructive pulmonary disease, unspecified: Secondary | ICD-10-CM | POA: Insufficient documentation

## 2016-11-14 DIAGNOSIS — R509 Fever, unspecified: Secondary | ICD-10-CM | POA: Diagnosis not present

## 2016-11-14 DIAGNOSIS — R109 Unspecified abdominal pain: Secondary | ICD-10-CM

## 2016-11-14 DIAGNOSIS — I130 Hypertensive heart and chronic kidney disease with heart failure and stage 1 through stage 4 chronic kidney disease, or unspecified chronic kidney disease: Secondary | ICD-10-CM | POA: Diagnosis not present

## 2016-11-14 DIAGNOSIS — D509 Iron deficiency anemia, unspecified: Secondary | ICD-10-CM | POA: Diagnosis not present

## 2016-11-14 DIAGNOSIS — N2581 Secondary hyperparathyroidism of renal origin: Secondary | ICD-10-CM | POA: Diagnosis not present

## 2016-11-14 DIAGNOSIS — D689 Coagulation defect, unspecified: Secondary | ICD-10-CM | POA: Diagnosis not present

## 2016-11-14 DIAGNOSIS — J45909 Unspecified asthma, uncomplicated: Secondary | ICD-10-CM | POA: Diagnosis not present

## 2016-11-14 DIAGNOSIS — Z7982 Long term (current) use of aspirin: Secondary | ICD-10-CM | POA: Diagnosis not present

## 2016-11-14 DIAGNOSIS — I2510 Atherosclerotic heart disease of native coronary artery without angina pectoris: Secondary | ICD-10-CM | POA: Diagnosis not present

## 2016-11-14 DIAGNOSIS — I5042 Chronic combined systolic (congestive) and diastolic (congestive) heart failure: Secondary | ICD-10-CM | POA: Diagnosis not present

## 2016-11-14 DIAGNOSIS — E1140 Type 2 diabetes mellitus with diabetic neuropathy, unspecified: Secondary | ICD-10-CM | POA: Diagnosis not present

## 2016-11-14 DIAGNOSIS — Z79899 Other long term (current) drug therapy: Secondary | ICD-10-CM | POA: Insufficient documentation

## 2016-11-14 DIAGNOSIS — N186 End stage renal disease: Secondary | ICD-10-CM | POA: Diagnosis not present

## 2016-11-14 DIAGNOSIS — R52 Pain, unspecified: Secondary | ICD-10-CM | POA: Diagnosis not present

## 2016-11-14 LAB — CBC WITH DIFFERENTIAL/PLATELET
BASOS ABS: 0 10*3/uL (ref 0.0–0.1)
BASOS PCT: 0 %
EOS ABS: 0.1 10*3/uL (ref 0.0–0.7)
Eosinophils Relative: 1 %
HCT: 38.4 % (ref 36.0–46.0)
Hemoglobin: 12.2 g/dL (ref 12.0–15.0)
LYMPHS PCT: 22 %
Lymphs Abs: 1.9 10*3/uL (ref 0.7–4.0)
MCH: 29.3 pg (ref 26.0–34.0)
MCHC: 31.8 g/dL (ref 30.0–36.0)
MCV: 92.3 fL (ref 78.0–100.0)
Monocytes Absolute: 0.5 10*3/uL (ref 0.1–1.0)
Monocytes Relative: 6 %
Neutro Abs: 6.3 10*3/uL (ref 1.7–7.7)
Neutrophils Relative %: 71 %
PLATELETS: 226 10*3/uL (ref 150–400)
RBC: 4.16 MIL/uL (ref 3.87–5.11)
RDW: 14.6 % (ref 11.5–15.5)
WBC: 8.9 10*3/uL (ref 4.0–10.5)

## 2016-11-14 LAB — COMPREHENSIVE METABOLIC PANEL
ALBUMIN: 3.8 g/dL (ref 3.5–5.0)
ALT: 15 U/L (ref 14–54)
AST: 25 U/L (ref 15–41)
Alkaline Phosphatase: 72 U/L (ref 38–126)
Anion gap: 10 (ref 5–15)
BUN: 11 mg/dL (ref 6–20)
CHLORIDE: 98 mmol/L — AB (ref 101–111)
CO2: 28 mmol/L (ref 22–32)
CREATININE: 1.55 mg/dL — AB (ref 0.44–1.00)
Calcium: 8.7 mg/dL — ABNORMAL LOW (ref 8.9–10.3)
GFR calc Af Amer: 34 mL/min — ABNORMAL LOW (ref >60)
GFR calc non Af Amer: 29 mL/min — ABNORMAL LOW (ref >60)
GLUCOSE: 154 mg/dL — AB (ref 65–99)
POTASSIUM: 3.6 mmol/L (ref 3.5–5.1)
SODIUM: 136 mmol/L (ref 135–145)
Total Bilirubin: 0.6 mg/dL (ref 0.3–1.2)
Total Protein: 6.8 g/dL (ref 6.5–8.1)

## 2016-11-14 MED ORDER — CEPHALEXIN 250 MG PO CAPS: 250.0000 mg | ORAL_CAPSULE | Freq: Three times a day (TID) | ORAL | 0 refills | Status: DC

## 2016-11-14 MED ORDER — IOPAMIDOL (ISOVUE-300) INJECTION 61%
INTRAVENOUS | Status: AC
  Filled 2016-11-14: qty 30

## 2016-11-14 MED ORDER — MORPHINE SULFATE (PF) 4 MG/ML IV SOLN: 4.0000 mg | Freq: Once | INTRAVENOUS | Status: DC

## 2016-11-14 MED ORDER — OXYCODONE-ACETAMINOPHEN 5-325 MG PO TABS
1.0000 | ORAL_TABLET | Freq: Once | ORAL | Status: AC
  Administered 2016-11-14: 1 via ORAL
  Filled 2016-11-14: qty 1

## 2016-11-14 NOTE — ED Notes (Signed)
Pt sts she does not produce urine.

## 2016-11-14 NOTE — Discharge Instructions (Signed)
You are given antibiotics for possible bladder/kidney infection.  Your CT scan otherwise does not show any serious causes of pain. Please follow-up with your PCP Return for worsening symptoms, including fever, vomiting, worsening pain, confusion or any other symptom concerning to you.

## 2016-11-14 NOTE — ED Provider Notes (Signed)
[facility] DEPT Provider Note   CSN: [id number] Arrival date & time: 11/14/16  1544     History   Chief Complaint Chief Complaint  Patient presents with  . Flank Pain    HPI [name] is a 83 y.o. female.  The history is provided by the patient.  Flank Pain  This is a new problem. The current episode started 6 to 12 hours ago. The problem occurs constantly. The problem has not changed since onset.Pertinent negatives include no chest pain, no abdominal pain and no headaches. The symptoms are aggravated by bending and twisting. The symptoms are relieved by medications and narcotics. The treatment provided moderate relief.   83 year old female who presents with right flank pain starting this morning when she woke him from sleep. She has a history of end-stage renal disease, just got dialyzed today. Has noted some mild dysuria this week when she does urinate. She denies any falls, heavy lifting, or exertional activity. Denies abdominal pain, nausea or vomiting, diarrhea or constipation, urinary frequency, hematuria, fevers or chills. Pain also worse with movement and palpation.  Past Medical History:  Diagnosis Date  . Arthritis   . Asthma    Mild  . Bradycardia    a. Not on BB due to this.  Marland Kitchen CAD (coronary artery disease)    a. s/p multiple caths with nonobs CAD;   b. cath 1/10: pLAD 20%, mLAD 40%, pCFX 20%, mCFX 40%, pRCA 60-70%;   c.  Myoview 06/02/12: Low anterior wall scar, no ischemia, EF 37%. d. cath 06/20/2014 70% mid LCx, medical therapy, high risk for PCI due to close proximity to large OM e. cath 03/07/15 pro RCA 50% & pro to mid Cx 75%, both stable, LVEDP 33-35mm Hg-> medical management  . Cardiomyopathy ([place])    a. Echo 05/31/12: EF 40-45%. b. Echo 06/2014: EF 50-55%. c. Cath 02/2015: EF 45-50%.  . Chronic combined systolic and diastolic CHF (congestive heart failure) (HCC)    a. EF 50-55%, mild LVH and grade 1 diast. Dysfxn b. Grade 3 Diastolic Dysfunction  73/7106. b. 02/2015: EF 45-50% by cath.  . CKD (chronic kidney disease), stage III    Dr. Lorrene Reid,    dialyzes Tuesday, Thursday, Saturday  . COPD (chronic obstructive pulmonary disease) ([place])   . Depression   . Diabetes mellitus    type II; peripheral neuropathy  . Diverticulosis   . DM retinopathy ([place])   . Dyslipidemia   . GERD (gastroesophageal reflux disease)   . Hiatal hernia   . History of shingles   . Hypertension   . Noncompliance   . Obesity   . Osteoarthritis cervical spine   . Peptic stricture of esophagus   . Peptic ulcer disease    duodenal  . Peripheral vascular disease ([place])    a. s/p L BKA 08/2012.  Marland Kitchen Pruritic condition    Idiopathic  . Shortness of breath dyspnea   . Sleep apnea    with CPAP  . Uterine cancer ([place])   . Zoster     Patient Active Problem List   Diagnosis Date Noted  . Shoulder pain 10/18/2016  . OSA (obstructive sleep apnea) 09/16/2016  . Dysuria 07/23/2016  . PAD (peripheral artery disease) ([place]) 05/01/2016  . ESRD needing dialysis ([place]) 02/17/2016  . Coronary artery disease involving native coronary artery of native heart without angina pectoris 10/23/2015  . NSTEMI (non-ST elevated myocardial infarction) ([place]) 10/23/2015  . Hypertensive heart disease with heart failure ([place]) 10/23/2015  .  Chest pain syndrome 10/17/2015  . COPD (chronic obstructive pulmonary disease) ([place]) 10/17/2015  . Prolonged Q-T interval on ECG 10/17/2015  . Diabetes ([place]) 09/18/2015  . NSVT (nonsustained ventricular tachycardia) ([place]) 04/26/2015  . Chronic diastolic congestive heart failure, NYHA class 3 ([place])   . Elevated troponin 03/06/2015  . Dizziness 10/25/2014  . Caries 08/01/2014  . Sinus bradycardia 06/19/2014  . Contrast media allergy 06/19/2014  . Gout 12/09/2013  . Avulsion injury of left iliopsoas and hematoma 12/06/2013  . Gait instability: Status post left BKA 09/10/2012  . Vitamin D deficiency 02/20/2012  . Encounter for long-term (current)  use of other medications 11/21/2011  . HYPOKALEMIA 01/22/2010  . CAROTID BRUIT, LEFT 05/18/2009  . DYSPHAGIA 04/05/2009  . Obstructive sleep apnea 10/12/2008  . CKD (chronic kidney disease) stage V requiring chronic dialysis ([place]) 07/19/2008  . Osteoporosis 06/17/2008  . Hyperlipidemia 07/16/2007  . DEPRESSION 07/16/2007  . Hereditary and idiopathic peripheral neuropathy 07/16/2007  . ASTHMA 07/16/2007  . COPD 07/16/2007  . PEPTIC ULCER DISEASE 07/16/2007  . Essential hypertension 01/22/2007  . Coronary artery disease due to lipid rich plaque; 70% ostial-proximal AV Groove Cx - not good PCI target 01/22/2007  . PERIPHERAL VASCULAR DISEASE 01/22/2007  . GERD 01/22/2007  . OSTEOARTHRITIS 01/22/2007    Past Surgical History:  Procedure Laterality Date  . ABDOMINAL ANGIOGRAM N/A 08/31/2012   Procedure: ABDOMINAL ANGIOGRAM with runoff poss intervention;  Surgeon: Angelia Mould, MD;  Location: [facility] CATH LAB;  Service: Cardiovascular;  Laterality: N/A;  . AMPUTATION Left 09/04/2012   Procedure: AMPUTATION BELOW KNEE;  Surgeon: Angelia Mould, MD;  Location: [facility];  Service: Vascular;  Laterality: Left;  . AV FISTULA PLACEMENT Left 05/10/2015   Procedure: CREATION OF LEFT UPPER ARM ARTERIOVENOUS (AV) FISTULA ;  Surgeon: Elam Dutch, MD;  Location: [facility];  Service: Vascular;  Laterality: Left;  . BRAIN SURGERY     1997 blood clot on the brain, then had to relieve fluid on the brain  . CARDIAC CATHETERIZATION    . CARDIAC CATHETERIZATION N/A 03/07/2015   Procedure: Left Heart Cath and Coronary Angiography;  Surgeon: Leonie Man, MD;  Location: [facility] CV LAB;  Service: Cardiovascular;  Laterality: N/A;  . CATARACT EXTRACTION, BILATERAL  2005  . CHOLECYSTECTOMY  2010  . CRANIOTOMY  1997   Left for SDH  . DEXA  7/05  . ESOPHAGOGASTRODUODENOSCOPY  04/04/2004  . EYE SURGERY    . HERNIA REPAIR    . KNEE ARTHROSCOPY  10/1998   Left  . LEFT HEART CATHETERIZATION WITH  CORONARY ANGIOGRAM N/A 06/22/2014   Procedure: LEFT HEART CATHETERIZATION WITH CORONARY ANGIOGRAM;  Surgeon: Burnell Blanks, MD;  Location: [facility] CATH LAB;  Service: Cardiovascular;  Laterality: N/A;  . MULTIPLE EXTRACTIONS WITH ALVEOLOPLASTY N/A 08/01/2014   Procedure: EXTRACTIONS OF TEETH NUMBERS 7 8 9 10 11  AND 19 AND ALVEOLOPLASTY UPPER LEFT AND RIGHT QUADRANT;  Surgeon: Isac Caddy, DDS;  Location: [facility];  Service: Oral Surgery;  Laterality: N/A;  . SPINE SURGERY     C-spine and lumbar surgery  . TUBAL LIGATION  1967  . TUBAL LIGATION      OB History    No data available       Home Medications    Prior to Admission medications   Medication Sig Start Date End Date Taking? Authorizing Provider  acetaminophen (TYLENOL) 500 MG tablet Take 500 mg by mouth every 6 (six) hours as needed for mild pain.  [provider]  albuterol (PROVENTIL HFA;VENTOLIN HFA) 108 (90 BASE) MCG/ACT inhaler Inhale 2 puffs into the lungs every 4 (four) hours as needed for wheezing or shortness of breath. 04/19/15   Renato Shin, MD  allopurinol (ZYLOPRIM) 100 MG tablet TAKE 1 TABLET(100 MG) BY MOUTH DAILY 08/19/16   Renato Shin, MD  aspirin EC 81 MG tablet Take 1 tablet (81 mg total) by mouth daily. 03/16/15   Dunn, Nedra Hai, PA-C  atorvastatin (LIPITOR) 40 MG tablet TAKE 1 TABLET BY MOUTH DAILY AT 6 PM 10/31/16   Renato Shin, MD  cephALEXin (KEFLEX) 250 MG capsule Take 1 capsule (250 mg total) by mouth 3 (three) times daily. 11/14/16   Forde Dandy, MD  clopidogrel (PLAVIX) 75 MG tablet Take 1 tablet (75 mg total) by mouth daily. 10/04/16   Dorothy Spark, MD  colestipol (COLESTID) 1 g tablet TAKE 3 TABLETS(3 GRAMS) BY MOUTH DAILY 10/11/16   Renato Shin, MD  donepezil (ARICEPT) 5 MG tablet TAKE 1 TABLET(5 MG) BY MOUTH AT BEDTIME 08/16/16   Renato Shin, MD  ezetimibe (ZETIA) 10 MG tablet Take 1 tablet (10 mg total) by mouth every morning. 11/04/16   Renato Shin, MD  feeding supplement  (GLUCERNA SHAKE) LIQD Take 237 mLs by mouth every morning.     [provider]  Fluticasone-Salmeterol (ADVAIR DISKUS) 100-50 MCG/DOSE AEPB Inhale 1 puff into the lungs 2 (two) times daily. 04/19/15   Renato Shin, MD  gabapentin (NEURONTIN) 100 MG capsule TAKE 1 CAPSULE BY MOUTH EVERY NIGHT AT BEDTIME 12/18/15   Renato Shin, MD  hydrALAZINE (APRESOLINE) 25 MG tablet TAKE 1 TABLET(25 MG) BY MOUTH THREE TIMES DAILY 10/31/16   Renato Shin, MD  isosorbide mononitrate (IMDUR) 60 MG 24 hr tablet TAKE 1 TABLET BY MOUTH DAILY 07/05/16   Dorothy Spark, MD  latanoprost (XALATAN) 0.005 % ophthalmic solution Place 1 drop into both eyes at bedtime.    [provider]  memantine (NAMENDA) 5 MG tablet Take 1 tablet (5 mg total) by mouth 2 (two) times daily. 12/18/15   Renato Shin, MD  nitroGLYCERIN (NITROSTAT) 0.4 MG SL tablet Place 0.4 mg under the tongue every 5 (five) minutes as needed for chest pain.     [provider]  ONE TOUCH ULTRA TEST test strip USE TO TEST BLOOD SUGAR TWICE DAILY 07/14/15   Renato Shin, MD  pantoprazole (PROTONIX) 40 MG tablet TAKE 1 TABLET BY MOUTH TWICE DAILY 10/11/16   Renato Shin, MD  RANEXA 500 MG 12 hr tablet TAKE 1 TABLET BY MOUTH EVERY 12 HOURS 07/21/15   Gildardo Cranker, DO  repaglinide (PRANDIN) 1 MG tablet Take 1 tablet (1 mg total) by mouth 2 (two) times daily before a meal. 10/18/16   Renato Shin, MD  sertraline (ZOLOFT) 100 MG tablet TAKE 1 TABLET(100 MG) BY MOUTH DAILY 10/28/16   Renato Shin, MD    Family History Family History  Problem Relation Age of Onset  . Cancer - Other Mother        "Stomach" Cancer  . Diabetes Mother   . Heart disease Mother   . Stomach cancer Mother   . Hypertension Mother   . Rheum arthritis Mother   . Lymphoma Father   . Hypertension Father   . Rheum arthritis Father   . Kidney disease Paternal Grandmother   . Stroke Paternal Grandmother   . Asthma Other   . Diabetes Sister   . Heart attack Neg  Hx  Social History Social History  Substance Use Topics  . Smoking status: Never Smoker  . Smokeless tobacco: Never Used  . Alcohol use No     Comment: rare     Allergies   Iohexol   Review of Systems Review of Systems  Constitutional: Negative for fever.  Cardiovascular: Negative for chest pain.  Gastrointestinal: Negative for abdominal pain.  Genitourinary: Positive for flank pain.  Musculoskeletal: Positive for back pain.  Neurological: Negative for headaches.  Psychiatric/Behavioral: Negative for confusion.  All other systems reviewed and are negative.    Physical Exam Updated Vital Signs BP (!) 119/52   Pulse 68   Resp 12   SpO2 94%   Physical Exam Physical Exam  Nursing note and vitals reviewed. Constitutional:, non-toxic, and in no acute distress Head: Normocephalic and atraumatic.  Mouth/Throat: Oropharynx is clear and moist.  Neck: Normal range of motion. Neck supple.  Cardiovascular: Normal rate and regular rhythm.   Pulmonary/Chest: Effort normal and breath sounds normal.  Abdominal: Soft. There is no tenderness. There is no rebound and no guarding.  Musculoskeletal: Normal range of motion. tender over right upper lumbar paraspinal muscles and mild CVA tenderness Neurological: Alert, no facial droop, fluent speech, moves all extremities symmetrically Skin: Skin is warm and dry.  Psychiatric: Cooperative   ED Treatments / Results  Labs (all labs ordered are listed, but only abnormal results are displayed) Labs Reviewed  COMPREHENSIVE METABOLIC PANEL - Abnormal; Notable for the following:       Result Value   Chloride 98 (*)    Glucose, Bld 154 (*)    Creatinine, Ser 1.55 (*)    Calcium 8.7 (*)    GFR calc non Af Amer 29 (*)    GFR calc Af Amer 34 (*)    All other components within normal limits  CBC WITH DIFFERENTIAL/PLATELET    EKG  EKG Interpretation None       Radiology Ct Abdomen Pelvis Wo Contrast  Result Date:  11/14/2016 CLINICAL DATA:  Right flank pain since 8 a.m. EXAM: CT ABDOMEN AND PELVIS WITHOUT CONTRAST TECHNIQUE: Multidetector CT imaging of the abdomen and pelvis was performed following the standard protocol without IV contrast. COMPARISON:  12/05/2013 CT FINDINGS: Lower chest: Borderline cardiomegaly with mitral annular calcifications and coronary arteriosclerosis. No pericardial effusion. Lung bases are clear. Hepatobiliary: Status post cholecystectomy. No biliary dilatation or mass. Pancreas: Unremarkable. No pancreatic ductal dilatation or surrounding inflammatory changes. Spleen: No splenomegaly or mass. Adrenals/Urinary Tract: Normal bilateral adrenal glands and kidneys without obstructive uropathy. No nephrolithiasis. Contracted bladder which may account the mild thick-walled appearance. Stomach/Bowel: Contracted stomach. Normal small bowel rotation without obstruction or inflammation. Normal-appearing appendix. Colonic diverticulosis without acute diverticulitis along the sigmoid colon. Vascular/Lymphatic: Dense aortoiliac and branch vessel atherosclerosis without aneurysm. No lymphadenopathy by CT size criteria. Reproductive: Uterine calcifications. Possible calcified uterine fibroid post fall laterally on the right. No adnexal mass. Other: Small fat containing umbilical hernia. No abdominopelvic ascites. Musculoskeletal: Healed fracture deformity of the right iliac bone. Levoconvex curvature of the lumbar spine with lumbar spondylosis. Degenerative joint space narrowing of both hips with mild protrusio. Grade 1 anterolisthesis at L3-4 and L4-5 with multilevel facet arthropathy. IMPRESSION: 1. No nephrolithiasis nor obstructive uropathy. Slightly thick-walled appearance of the bladder is likely due to underdistention. Correlate to exclude a cystitis. 2. Sigmoid diverticulosis without acute diverticulitis. No bowel obstruction or inflammation. 3. Thoracolumbar spondylosis with dextroscoliosis. No acute  osseous appearing abnormality. Healed fracture deformity of the right iliac bone.  Electronically Signed   By: Ashley Royalty M.D.   On: 11/14/2016 20:51    Procedures Procedures (including critical care time)  Medications Ordered in ED Medications  iopamidol (ISOVUE-300) 61 % injection (not administered)  oxyCODONE-acetaminophen (PERCOCET/ROXICET) 5-325 MG per tablet 1 tablet (1 tablet Oral Given 11/14/16 1850)     Initial Impression / Assessment and Plan / ED Course  I have reviewed the triage vital signs and the nursing notes.  Pertinent labs & imaging results that were available during my care of the patient were reviewed by me and considered in my medical decision making (see chart for details).     Presenting with right flank pain. She is nontoxic in no acute distress. Vitals non-concerning. Parts of her symptoms seems musculoskeletal as her pain is worse with palpation of the right paraspinal muscles, and pain is worse with movement. She has had dysuria over past week, but she has end-stage renal disease, and makes very little urine. He just had dialysis today and unable to provide any urine on days of dialysis. She did undergo CT abdomen and pelvis to rule out nephrolithiasis, urolithiasis, aortic pathology or other acute retroperitoneal or intra-abdominal process. Blood work are reassuring without signs or symptoms of systemic illness. This is visualized and shows no acute retroperitoneal or intra-abdominal processes. There is very little distention of the bladder, but it also may be possible cystitis. We'll treat with 10 day course of Keflex for potential kidney/urinary tract infection. Symptoms are improved with oral analgesics, and she takes hydrocodone at home for pain normally. She is felt stable for discharge home. Strict return and follow-up instructions reviewed. She expressed understanding of all discharge instructions and felt comfortable with the plan of care.   Final Clinical  Impressions(s) / ED Diagnoses   Final diagnoses:  Right flank pain    New Prescriptions New Prescriptions   CEPHALEXIN (KEFLEX) 250 MG CAPSULE    Take 1 capsule (250 mg total) by mouth 3 (three) times daily.     Forde Dandy, MD 11/14/16 2109

## 2016-11-14 NOTE — ED Triage Notes (Signed)
Pt c/o right flank pain and painful urination,.  Onset this am.  Pt denies any other symptoms

## 2016-11-14 NOTE — ED Notes (Signed)
Spoke with patient who states she doesn't make much urine, does not make urine daily, and never makes urine on days she has dialysis.  Pt states she had dialysis today.  Dr. Oleta Mouse notified

## 2016-11-14 NOTE — ED Notes (Signed)
Pt stable, ambulatory, states understanding of discharge instructions 

## 2016-11-14 NOTE — ED Notes (Signed)
Patient transported to CT 

## 2016-11-16 DIAGNOSIS — D509 Iron deficiency anemia, unspecified: Secondary | ICD-10-CM | POA: Diagnosis not present

## 2016-11-16 DIAGNOSIS — D631 Anemia in chronic kidney disease: Secondary | ICD-10-CM | POA: Diagnosis not present

## 2016-11-16 DIAGNOSIS — N2581 Secondary hyperparathyroidism of renal origin: Secondary | ICD-10-CM | POA: Diagnosis not present

## 2016-11-16 DIAGNOSIS — D689 Coagulation defect, unspecified: Secondary | ICD-10-CM | POA: Diagnosis not present

## 2016-11-16 DIAGNOSIS — E1121 Type 2 diabetes mellitus with diabetic nephropathy: Secondary | ICD-10-CM | POA: Diagnosis not present

## 2016-11-16 DIAGNOSIS — N186 End stage renal disease: Secondary | ICD-10-CM | POA: Diagnosis not present

## 2016-11-18 ENCOUNTER — Telehealth: Payer: Self-pay | Admitting: Endocrinology

## 2016-11-18 NOTE — Telephone Encounter (Signed)
Please advise. Thank you

## 2016-11-18 NOTE — Telephone Encounter (Signed)
**  Remind patient they can make refill requests via MyChart**  Medication refill request (Name & Dosage): donepezil (ARICEPT) 5 MG tablet ezetimibe (ZETIA) 10 MG tablet atorvastatin (LIPITOR) 40 MG tablet allopurinol (ZYLOPRIM) 100 MG tablet   Preferred pharmacy (Name & Address):  [facility], [place] [phone] (Phone) [phone] (Fax)    Other comments (if applicable):   Patient's daughter Marlowe Kays (she is on the [facility]) called to request an extension on patient's medications above to 6 months- 1 year. I advised that I would put the request in and that someone clinical would be giving her a call to advise. Please notify once rx have been placed.

## 2016-11-18 NOTE — Telephone Encounter (Signed)
Please refill prn 

## 2016-11-20 DIAGNOSIS — H35363 Drusen (degenerative) of macula, bilateral: Secondary | ICD-10-CM | POA: Diagnosis not present

## 2016-11-20 DIAGNOSIS — E113551 Type 2 diabetes mellitus with stable proliferative diabetic retinopathy, right eye: Secondary | ICD-10-CM | POA: Diagnosis not present

## 2016-11-20 DIAGNOSIS — H401131 Primary open-angle glaucoma, bilateral, mild stage: Secondary | ICD-10-CM | POA: Diagnosis not present

## 2016-11-20 DIAGNOSIS — H31009 Unspecified chorioretinal scars, unspecified eye: Secondary | ICD-10-CM | POA: Diagnosis not present

## 2016-11-20 LAB — HM DIABETES EYE EXAM

## 2016-11-22 NOTE — Telephone Encounter (Signed)
**  Remind patient they can make refill requests via MyChart**  Medication refill request (Name & Dosage):  donepezil (ARICEPT) 5 MG tablet ezetimibe (ZETIA) 10 MG tablet atorvastatin (LIPITOR) 40 MG tablet allopurinol (ZYLOPRIM) 100 MG tablet    Preferred pharmacy (Name & Address):  [place], [place] [phone] (Phone) [phone] (Fax)      Other comments (if applicable):    Patient's daughter Marlowe Kays (she is on the [facility]) called to request an extension on patient's medications above to 6 months- 1 year. I advised that I would put the request in and that someone clinical would be giving her a call to advise. Please notify once rx have been placed.

## 2016-11-27 ENCOUNTER — Other Ambulatory Visit: Payer: Self-pay

## 2016-11-27 MED ORDER — EZETIMIBE 10 MG PO TABS: 10.0000 mg | ORAL_TABLET | Freq: Every morning | ORAL | 5 refills | Status: DC

## 2016-11-27 MED ORDER — DONEPEZIL HCL 5 MG PO TABS: ORAL_TABLET | ORAL | 5 refills | Status: AC

## 2016-11-27 MED ORDER — ATORVASTATIN CALCIUM 40 MG PO TABS: ORAL_TABLET | ORAL | 5 refills | Status: AC

## 2016-11-27 MED ORDER — ALLOPURINOL 100 MG PO TABS: ORAL_TABLET | ORAL | 5 refills | Status: AC

## 2016-11-27 NOTE — Telephone Encounter (Signed)
Spoke to the patients daughter new prescriptions have been sent for 30 days with 5 refills

## 2016-12-10 ENCOUNTER — Telehealth: Payer: Self-pay

## 2016-12-10 ENCOUNTER — Other Ambulatory Visit: Payer: Self-pay | Admitting: Endocrinology

## 2016-12-10 DIAGNOSIS — R2681 Unsteadiness on feet: Secondary | ICD-10-CM

## 2016-12-10 NOTE — Telephone Encounter (Signed)
Called patient to ask what the silicone lock liners were used for, per Dr.Ellison. They stated it was for her leg. MD will print off RX.

## 2016-12-14 DIAGNOSIS — D689 Coagulation defect, unspecified: Secondary | ICD-10-CM | POA: Diagnosis not present

## 2016-12-14 DIAGNOSIS — Z992 Dependence on renal dialysis: Secondary | ICD-10-CM | POA: Diagnosis not present

## 2016-12-14 DIAGNOSIS — N031 Chronic nephritic syndrome with focal and segmental glomerular lesions: Secondary | ICD-10-CM | POA: Diagnosis not present

## 2016-12-14 DIAGNOSIS — D631 Anemia in chronic kidney disease: Secondary | ICD-10-CM | POA: Diagnosis not present

## 2016-12-14 DIAGNOSIS — D509 Iron deficiency anemia, unspecified: Secondary | ICD-10-CM | POA: Diagnosis not present

## 2016-12-14 DIAGNOSIS — N2581 Secondary hyperparathyroidism of renal origin: Secondary | ICD-10-CM | POA: Diagnosis not present

## 2016-12-14 DIAGNOSIS — E1121 Type 2 diabetes mellitus with diabetic nephropathy: Secondary | ICD-10-CM | POA: Diagnosis not present

## 2016-12-14 DIAGNOSIS — N186 End stage renal disease: Secondary | ICD-10-CM | POA: Diagnosis not present

## 2016-12-17 DIAGNOSIS — E1121 Type 2 diabetes mellitus with diabetic nephropathy: Secondary | ICD-10-CM | POA: Diagnosis not present

## 2016-12-17 DIAGNOSIS — N186 End stage renal disease: Secondary | ICD-10-CM | POA: Diagnosis not present

## 2016-12-17 DIAGNOSIS — D631 Anemia in chronic kidney disease: Secondary | ICD-10-CM | POA: Diagnosis not present

## 2016-12-17 DIAGNOSIS — D689 Coagulation defect, unspecified: Secondary | ICD-10-CM | POA: Diagnosis not present

## 2016-12-17 DIAGNOSIS — N2581 Secondary hyperparathyroidism of renal origin: Secondary | ICD-10-CM | POA: Diagnosis not present

## 2016-12-20 ENCOUNTER — Other Ambulatory Visit: Payer: Self-pay | Admitting: Endocrinology

## 2016-12-20 ENCOUNTER — Telehealth: Payer: Self-pay | Admitting: Endocrinology

## 2016-12-20 MED ORDER — HYDRALAZINE HCL 25 MG PO TABS: ORAL_TABLET | ORAL | 1 refills | Status: AC

## 2016-12-20 MED ORDER — GABAPENTIN 100 MG PO CAPS: 100.0000 mg | ORAL_CAPSULE | Freq: Every day | ORAL | 3 refills | Status: AC

## 2016-12-20 MED ORDER — EZETIMIBE 10 MG PO TABS: 10.0000 mg | ORAL_TABLET | Freq: Every morning | ORAL | 2 refills | Status: AC

## 2016-12-20 MED ORDER — MEMANTINE HCL 5 MG PO TABS: 5.0000 mg | ORAL_TABLET | Freq: Two times a day (BID) | ORAL | 2 refills | Status: AC

## 2016-12-20 MED ORDER — SERTRALINE HCL 100 MG PO TABS: ORAL_TABLET | ORAL | 2 refills | Status: AC

## 2016-12-20 MED ORDER — ISOSORBIDE MONONITRATE ER 60 MG PO TB24: 60.0000 mg | ORAL_TABLET | Freq: Every day | ORAL | 2 refills | Status: DC

## 2016-12-20 NOTE — Telephone Encounter (Signed)
**  Remind patient they can make refill requests via MyChart**  Medication refill request (Name & Dosage): colestipol (COLESTID) 1 g tablet hydrALAZINE (APRESOLINE) 25 MG tablet isosorbide mononitrate (IMDUR) 60 MG 24 hr tablet sertraline (ZOLOFT) 100 MG tablet ezetimibe (ZETIA) 10 MG tablet gabapentin (NEURONTIN) 100 MG capsule memantine (NAMENDA) 5 MG tablet Preferred pharmacy (Name & Address):  [place], [place] [phone] (Phone) [phone] (Fax)      Other comments (if applicable):   Notify once rx have been placed; 6 month supply on all medications

## 2016-12-20 NOTE — Telephone Encounter (Signed)
Refills submitted and patient notified.

## 2016-12-24 ENCOUNTER — Other Ambulatory Visit: Payer: Self-pay | Admitting: Endocrinology

## 2016-12-24 ENCOUNTER — Telehealth: Payer: Self-pay | Admitting: Endocrinology

## 2016-12-24 DIAGNOSIS — R69 Illness, unspecified: Secondary | ICD-10-CM | POA: Diagnosis not present

## 2016-12-24 MED ORDER — GLUCOSE BLOOD VI STRP
ORAL_STRIP | 2 refills | Status: DC
Start: 2016-12-24 — End: 2017-03-29

## 2016-12-24 NOTE — Telephone Encounter (Signed)
Refill submitted and patient notified.  

## 2016-12-24 NOTE — Telephone Encounter (Signed)
**  Remind patient they can make refill requests via MyChart**  Medication refill request (Name & Dosage):  ONE TOUCH ULTRA TEST test strip  Preferred pharmacy (Name & Address):  [facility], [place] [phone] (Phone) [phone] (Fax)    Other comments (if applicable):  Notify patient once this has been done

## 2016-12-26 ENCOUNTER — Emergency Department (HOSPITAL_COMMUNITY)
Admission: EM | Admit: 2016-12-26 | Discharge: 2016-12-26 | Disposition: A | Discharge status: Home / Self Care | Payer: Medicare HMO | Attending: Emergency Medicine | Admitting: Emergency Medicine

## 2016-12-26 ENCOUNTER — Encounter (HOSPITAL_COMMUNITY): Payer: Self-pay | Admitting: Pharmacy Technician

## 2016-12-26 DIAGNOSIS — E1122 Type 2 diabetes mellitus with diabetic chronic kidney disease: Secondary | ICD-10-CM | POA: Insufficient documentation

## 2016-12-26 DIAGNOSIS — J45909 Unspecified asthma, uncomplicated: Secondary | ICD-10-CM | POA: Diagnosis not present

## 2016-12-26 DIAGNOSIS — I2510 Atherosclerotic heart disease of native coronary artery without angina pectoris: Secondary | ICD-10-CM | POA: Diagnosis not present

## 2016-12-26 DIAGNOSIS — R58 Hemorrhage, not elsewhere classified: Secondary | ICD-10-CM

## 2016-12-26 DIAGNOSIS — E1165 Type 2 diabetes mellitus with hyperglycemia: Secondary | ICD-10-CM | POA: Insufficient documentation

## 2016-12-26 DIAGNOSIS — I120 Hypertensive chronic kidney disease with stage 5 chronic kidney disease or end stage renal disease: Secondary | ICD-10-CM | POA: Diagnosis not present

## 2016-12-26 DIAGNOSIS — T82898A Other specified complication of vascular prosthetic devices, implants and grafts, initial encounter: Secondary | ICD-10-CM | POA: Diagnosis not present

## 2016-12-26 DIAGNOSIS — Z7982 Long term (current) use of aspirin: Secondary | ICD-10-CM | POA: Diagnosis not present

## 2016-12-26 DIAGNOSIS — Z7902 Long term (current) use of antithrombotics/antiplatelets: Secondary | ICD-10-CM | POA: Diagnosis not present

## 2016-12-26 DIAGNOSIS — T83498A Other mechanical complication of other prosthetic devices, implants and grafts of genital tract, initial encounter: Secondary | ICD-10-CM | POA: Diagnosis not present

## 2016-12-26 DIAGNOSIS — I5042 Chronic combined systolic (congestive) and diastolic (congestive) heart failure: Secondary | ICD-10-CM | POA: Insufficient documentation

## 2016-12-26 DIAGNOSIS — N185 Chronic kidney disease, stage 5: Secondary | ICD-10-CM | POA: Diagnosis not present

## 2016-12-26 DIAGNOSIS — J449 Chronic obstructive pulmonary disease, unspecified: Secondary | ICD-10-CM | POA: Diagnosis not present

## 2016-12-26 DIAGNOSIS — Z79899 Other long term (current) drug therapy: Secondary | ICD-10-CM | POA: Insufficient documentation

## 2016-12-26 DIAGNOSIS — Y828 Other medical devices associated with adverse incidents: Secondary | ICD-10-CM | POA: Insufficient documentation

## 2016-12-26 DIAGNOSIS — T82838A Hemorrhage of vascular prosthetic devices, implants and grafts, initial encounter: Secondary | ICD-10-CM | POA: Insufficient documentation

## 2016-12-26 LAB — CBC WITH DIFFERENTIAL/PLATELET
BASOS ABS: 0 10*3/uL (ref 0.0–0.1)
BASOS PCT: 0 %
EOS ABS: 0.2 10*3/uL (ref 0.0–0.7)
Eosinophils Relative: 2 %
HCT: 41.3 % (ref 36.0–46.0)
Hemoglobin: 13 g/dL (ref 12.0–15.0)
Lymphocytes Relative: 25 %
Lymphs Abs: 1.8 10*3/uL (ref 0.7–4.0)
MCH: 30.2 pg (ref 26.0–34.0)
MCHC: 31.5 g/dL (ref 30.0–36.0)
MCV: 96 fL (ref 78.0–100.0)
MONO ABS: 0.6 10*3/uL (ref 0.1–1.0)
MONOS PCT: 8 %
NEUTROS PCT: 65 %
Neutro Abs: 4.8 10*3/uL (ref 1.7–7.7)
PLATELETS: 232 10*3/uL (ref 150–400)
RBC: 4.3 MIL/uL (ref 3.87–5.11)
RDW: 16.6 % — AB (ref 11.5–15.5)
WBC: 7.4 10*3/uL (ref 4.0–10.5)

## 2016-12-26 LAB — I-STAT CHEM 8, ED
BUN: 10 mg/dL (ref 6–20)
Calcium, Ion: 0.87 mmol/L — CL (ref 1.15–1.40)
Chloride: 95 mmol/L — ABNORMAL LOW (ref 101–111)
Creatinine, Ser: 1.7 mg/dL — ABNORMAL HIGH (ref 0.44–1.00)
Glucose, Bld: 182 mg/dL — ABNORMAL HIGH (ref 65–99)
HEMATOCRIT: 44 % (ref 36.0–46.0)
HEMOGLOBIN: 15 g/dL (ref 12.0–15.0)
POTASSIUM: 3.7 mmol/L (ref 3.5–5.1)
SODIUM: 136 mmol/L (ref 135–145)
TCO2: 32 mmol/L (ref 0–100)

## 2016-12-26 MED ORDER — SODIUM CHLORIDE 0.9 % IV BOLUS (SEPSIS)
500.0000 mL | Freq: Once | INTRAVENOUS | Status: AC
  Administered 2016-12-26: 500 mL via INTRAVENOUS

## 2016-12-26 NOTE — ED Triage Notes (Signed)
Pt arrives via ems with reports of bleeding graft. Pt had full treatment at dialysis today and when they de accessed pt site did not stop bleeding. Per ems staffed applied pressure to try to get site to stop. Site would stop momentarily and then start bleeding again. VSS with EMS.

## 2016-12-26 NOTE — Discharge Instructions (Signed)
Go to your next dialysis session in 2 days.Dr Meredeth Ide will look into the reason that your fistula has been bleeding

## 2016-12-26 NOTE — ED Notes (Signed)
Iv access attempted X2 unsuccessful

## 2016-12-26 NOTE — ED Provider Notes (Signed)
[facility] DEPT Provider Note   CSN: [id number] Arrival date & time: 12/26/16  1713     History   Chief Complaint Chief Complaint  Patient presents with  . Vascular Access Problem    bleeding graft    HPI [name] is a 83 y.o. female.Patient reports her dialysis fistula bled today immediately after finishing dialysis from 3 PM to 4 PM. She was brought by EMS EMS applied direct pressure bleeding site with stoppage of bleeding she is presently asymptomatic except complains of mild generalized weakness. No other associated symptoms. She does report that her dialysis fistula has bled 4 times in the past 1 month after dialysis  HPI  Past Medical History:  Diagnosis Date  . Arthritis   . Asthma    Mild  . Bradycardia    a. Not on BB due to this.  Marland Kitchen CAD (coronary artery disease)    a. s/p multiple caths with nonobs CAD;   b. cath 1/10: pLAD 20%, mLAD 40%, pCFX 20%, mCFX 40%, pRCA 60-70%;   c.  Myoview 06/02/12: Low anterior wall scar, no ischemia, EF 37%. d. cath 06/20/2014 70% mid LCx, medical therapy, high risk for PCI due to close proximity to large OM e. cath 03/07/15 pro RCA 50% & pro to mid Cx 75%, both stable, LVEDP 33-52mm Hg-> medical management  . Cardiomyopathy ([place])    a. Echo 05/31/12: EF 40-45%. b. Echo 06/2014: EF 50-55%. c. Cath 02/2015: EF 45-50%.  . Chronic combined systolic and diastolic CHF (congestive heart failure) (HCC)    a. EF 50-55%, mild LVH and grade 1 diast. Dysfxn b. Grade 3 Diastolic Dysfunction 34/1937. b. 02/2015: EF 45-50% by cath.  . CKD (chronic kidney disease), stage III    Dr. Lorrene Reid,    dialyzes Tuesday, Thursday, Saturday  . COPD (chronic obstructive pulmonary disease) ([place])   . Depression   . Diabetes mellitus    type II; peripheral neuropathy  . Diverticulosis   . DM retinopathy ([place])   . Dyslipidemia   . GERD (gastroesophageal reflux disease)   . Hiatal hernia   . History of shingles   . Hypertension   . Noncompliance   .  Obesity   . Osteoarthritis cervical spine   . Peptic stricture of esophagus   . Peptic ulcer disease    duodenal  . Peripheral vascular disease ([place])    a. s/p L BKA 08/2012.  Marland Kitchen Pruritic condition    Idiopathic  . Shortness of breath dyspnea   . Sleep apnea    with CPAP  . Uterine cancer ([place])   . Zoster     Patient Active Problem List   Diagnosis Date Noted  . Shoulder pain 10/18/2016  . OSA (obstructive sleep apnea) 09/16/2016  . Dysuria 07/23/2016  . PAD (peripheral artery disease) ([place]) 05/01/2016  . ESRD needing dialysis ([place]) 02/17/2016  . Coronary artery disease involving native coronary artery of native heart without angina pectoris 10/23/2015  . NSTEMI (non-ST elevated myocardial infarction) ([place]) 10/23/2015  . Hypertensive heart disease with heart failure ([place]) 10/23/2015  . Chest pain syndrome 10/17/2015  . COPD (chronic obstructive pulmonary disease) ([place]) 10/17/2015  . Prolonged Q-T interval on ECG 10/17/2015  . Diabetes ([place]) 09/18/2015  . NSVT (nonsustained ventricular tachycardia) ([place]) 04/26/2015  . Chronic diastolic congestive heart failure, NYHA class 3 ([place])   . Elevated troponin 03/06/2015  . Dizziness 10/25/2014  . Caries 08/01/2014  . Sinus bradycardia 06/19/2014  . Contrast media allergy 06/19/2014  .  Gout 12/09/2013  . Avulsion injury of left iliopsoas and hematoma 12/06/2013  . Gait instability: Status post left BKA 09/10/2012  . Vitamin D deficiency 02/20/2012  . Encounter for long-term (current) use of other medications 11/21/2011  . HYPOKALEMIA 01/22/2010  . CAROTID BRUIT, LEFT 05/18/2009  . DYSPHAGIA 04/05/2009  . Obstructive sleep apnea 10/12/2008  . CKD (chronic kidney disease) stage V requiring chronic dialysis ([place]) 07/19/2008  . Osteoporosis 06/17/2008  . Hyperlipidemia 07/16/2007  . DEPRESSION 07/16/2007  . Hereditary and idiopathic peripheral neuropathy 07/16/2007  . ASTHMA 07/16/2007  . COPD 07/16/2007  . PEPTIC ULCER DISEASE  07/16/2007  . Essential hypertension 01/22/2007  . Coronary artery disease due to lipid rich plaque; 70% ostial-proximal AV Groove Cx - not good PCI target 01/22/2007  . PERIPHERAL VASCULAR DISEASE 01/22/2007  . GERD 01/22/2007  . OSTEOARTHRITIS 01/22/2007    Past Surgical History:  Procedure Laterality Date  . ABDOMINAL ANGIOGRAM N/A 08/31/2012   Procedure: ABDOMINAL ANGIOGRAM with runoff poss intervention;  Surgeon: Angelia Mould, MD;  Location: [facility] CATH LAB;  Service: Cardiovascular;  Laterality: N/A;  . AMPUTATION Left 09/04/2012   Procedure: AMPUTATION BELOW KNEE;  Surgeon: Angelia Mould, MD;  Location: [facility];  Service: Vascular;  Laterality: Left;  . AV FISTULA PLACEMENT Left 05/10/2015   Procedure: CREATION OF LEFT UPPER ARM ARTERIOVENOUS (AV) FISTULA ;  Surgeon: Elam Dutch, MD;  Location: [facility];  Service: Vascular;  Laterality: Left;  . BRAIN SURGERY     1997 blood clot on the brain, then had to relieve fluid on the brain  . CARDIAC CATHETERIZATION    . CARDIAC CATHETERIZATION N/A 03/07/2015   Procedure: Left Heart Cath and Coronary Angiography;  Surgeon: Leonie Man, MD;  Location: [facility] CV LAB;  Service: Cardiovascular;  Laterality: N/A;  . CATARACT EXTRACTION, BILATERAL  2005  . CHOLECYSTECTOMY  2010  . CRANIOTOMY  1997   Left for SDH  . DEXA  7/05  . ESOPHAGOGASTRODUODENOSCOPY  04/04/2004  . EYE SURGERY    . HERNIA REPAIR    . KNEE ARTHROSCOPY  10/1998   Left  . LEFT HEART CATHETERIZATION WITH CORONARY ANGIOGRAM N/A 06/22/2014   Procedure: LEFT HEART CATHETERIZATION WITH CORONARY ANGIOGRAM;  Surgeon: Burnell Blanks, MD;  Location: [facility] CATH LAB;  Service: Cardiovascular;  Laterality: N/A;  . MULTIPLE EXTRACTIONS WITH ALVEOLOPLASTY N/A 08/01/2014   Procedure: EXTRACTIONS OF TEETH NUMBERS 7 8 9 10 11  AND 19 AND ALVEOLOPLASTY UPPER LEFT AND RIGHT QUADRANT;  Surgeon: Isac Caddy, DDS;  Location: [facility];  Service: Oral Surgery;   Laterality: N/A;  . SPINE SURGERY     C-spine and lumbar surgery  . TUBAL LIGATION  1967  . TUBAL LIGATION      OB History    No data available       Home Medications    Prior to Admission medications   Medication Sig Start Date End Date Taking? Authorizing Provider  acetaminophen (TYLENOL) 500 MG tablet Take 500 mg by mouth every 6 (six) hours as needed for mild pain.    [provider]  albuterol (PROVENTIL HFA;VENTOLIN HFA) 108 (90 BASE) MCG/ACT inhaler Inhale 2 puffs into the lungs every 4 (four) hours as needed for wheezing or shortness of breath. 04/19/15   Renato Shin, MD  allopurinol (ZYLOPRIM) 100 MG tablet TAKE 1 TABLET(100 MG) BY MOUTH DAILY 11/27/16   Renato Shin, MD  aspirin EC 81 MG tablet Take 1 tablet (81 mg total) by  mouth daily. 03/16/15   Dunn, Nedra Hai, PA-C  atorvastatin (LIPITOR) 40 MG tablet TAKE 1 TABLET BY MOUTH DAILY AT 6 PM 11/27/16   Renato Shin, MD  cephALEXin (KEFLEX) 250 MG capsule Take 1 capsule (250 mg total) by mouth 3 (three) times daily. 11/14/16   Forde Dandy, MD  clopidogrel (PLAVIX) 75 MG tablet Take 1 tablet (75 mg total) by mouth daily. 10/04/16   Dorothy Spark, MD  colestipol (COLESTID) 1 g tablet TAKE 3 TABLETS BY MOUTH ONCE DAILY 12/20/16   Renato Shin, MD  donepezil (ARICEPT) 5 MG tablet TAKE 1 TABLET(5 MG) BY MOUTH AT BEDTIME 11/27/16   Renato Shin, MD  ezetimibe (ZETIA) 10 MG tablet Take 1 tablet (10 mg total) by mouth every morning. 12/20/16   Renato Shin, MD  feeding supplement (GLUCERNA SHAKE) LIQD Take 237 mLs by mouth every morning.     [provider]  Fluticasone-Salmeterol (ADVAIR DISKUS) 100-50 MCG/DOSE AEPB Inhale 1 puff into the lungs 2 (two) times daily. 04/19/15   Renato Shin, MD  gabapentin (NEURONTIN) 100 MG capsule Take 1 capsule (100 mg total) by mouth at bedtime. 12/20/16   Renato Shin, MD  glucose blood (ONE TOUCH ULTRA TEST) test strip USE TO TEST BLOOD SUGAR TWICE DAILY 12/24/16   Renato Shin,  MD  hydrALAZINE (APRESOLINE) 25 MG tablet TAKE 1 TABLET(25 MG) BY MOUTH THREE TIMES DAILY 12/20/16   Renato Shin, MD  isosorbide mononitrate (IMDUR) 60 MG 24 hr tablet Take 1 tablet (60 mg total) by mouth daily. 12/20/16   Renato Shin, MD  latanoprost (XALATAN) 0.005 % ophthalmic solution Place 1 drop into both eyes at bedtime.    [provider]  memantine (NAMENDA) 5 MG tablet Take 1 tablet (5 mg total) by mouth 2 (two) times daily. 12/20/16   Renato Shin, MD  nitroGLYCERIN (NITROSTAT) 0.4 MG SL tablet Place 0.4 mg under the tongue every 5 (five) minutes as needed for chest pain.     [provider]  pantoprazole (PROTONIX) 40 MG tablet TAKE 1 TABLET BY MOUTH TWICE DAILY 10/11/16   Renato Shin, MD  RANEXA 500 MG 12 hr tablet TAKE 1 TABLET BY MOUTH EVERY 12 HOURS 07/21/15   Gildardo Cranker, DO  repaglinide (PRANDIN) 1 MG tablet Take 1 tablet (1 mg total) by mouth 2 (two) times daily before a meal. 10/18/16   Renato Shin, MD  sertraline (ZOLOFT) 100 MG tablet TAKE 1 TABLET(100 MG) BY MOUTH DAILY 12/20/16   Renato Shin, MD    Family History Family History  Problem Relation Age of Onset  . Cancer - Other Mother        "Stomach" Cancer  . Diabetes Mother   . Heart disease Mother   . Stomach cancer Mother   . Hypertension Mother   . Rheum arthritis Mother   . Lymphoma Father   . Hypertension Father   . Rheum arthritis Father   . Kidney disease Paternal Grandmother   . Stroke Paternal Grandmother   . Asthma Other   . Diabetes Sister   . Heart attack Neg Hx     Social History Social History  Substance Use Topics  . Smoking status: Never Smoker  . Smokeless tobacco: Never Used  . Alcohol use No     Comment: rare     Allergies   Iohexol   Review of Systems Review of Systems  Allergic/Immunologic: Positive for immunocompromised state.  Neurological: Positive for weakness.  All other systems reviewed and  are negative.    Physical Exam Updated Vital  Signs BP (!) 115/50   Pulse 73   Temp 99.6 F (37.6 C) (Oral)   Resp 16   SpO2 98%   Physical Exam  Constitutional: She is oriented to person, place, and time.  Chronically ill-appearing  HENT:  Head: Normocephalic and atraumatic.  Eyes: Pupils are equal, round, and reactive to light. Conjunctivae are normal.  Neck: Neck supple. No tracheal deviation present. No thyromegaly present.  Cardiovascular: Normal rate and regular rhythm.   No murmur heard. Pulmonary/Chest: Effort normal and breath sounds normal.  Abdominal: Soft. Bowel sounds are normal. She exhibits no distension. There is no tenderness.  Musculoskeletal: Normal range of motion. She exhibits no edema or tenderness.  Left BKA. Dialysis fistula at left upper arm with pressure dressing applied. Pressure dressing was removed to reveal no active bleeding at dialysis fistula has good thrill. No hematoma  Neurological: She is alert and oriented to person, place, and time. Coordination normal.  Skin: Skin is warm and dry. No rash noted.  Psychiatric: She has a normal mood and affect.  Nursing note and vitals reviewed.    ED Treatments / Results  Labs (all labs ordered are listed, but only abnormal results are displayed) Labs Reviewed  CBC WITH DIFFERENTIAL/PLATELET  I-STAT CHEM 8, ED    EKG  EKG Interpretation None       Radiology No results found.  Procedures Procedures (including critical care time)  Medications Ordered in ED Medications  sodium chloride 0.9 % bolus 500 mL (not administered)   Results for orders placed or performed during the hospital encounter of 12/26/16  CBC with Differential/Platelet  Result Value Ref Range   WBC 7.4 4.0 - 10.5 K/uL   RBC 4.30 3.87 - 5.11 MIL/uL   Hemoglobin 13.0 12.0 - 15.0 g/dL   HCT 41.3 36.0 - 46.0 %   MCV 96.0 78.0 - 100.0 fL   MCH 30.2 26.0 - 34.0 pg   MCHC 31.5 30.0 - 36.0 g/dL   RDW 16.6 (H) 11.5 - 15.5 %   Platelets 232 150 - 400 K/uL   Neutrophils  Relative % 65 %   Neutro Abs 4.8 1.7 - 7.7 K/uL   Lymphocytes Relative 25 %   Lymphs Abs 1.8 0.7 - 4.0 K/uL   Monocytes Relative 8 %   Monocytes Absolute 0.6 0.1 - 1.0 K/uL   Eosinophils Relative 2 %   Eosinophils Absolute 0.2 0.0 - 0.7 K/uL   Basophils Relative 0 %   Basophils Absolute 0.0 0.0 - 0.1 K/uL  I-stat chem 8, ed  Result Value Ref Range   Sodium 136 135 - 145 mmol/L   Potassium 3.7 3.5 - 5.1 mmol/L   Chloride 95 (L) 101 - 111 mmol/L   BUN 10 6 - 20 mg/dL   Creatinine, Ser 1.70 (H) 0.44 - 1.00 mg/dL   Glucose, Bld 182 (H) 65 - 99 mg/dL   Calcium, Ion 0.87 (LL) 1.15 - 1.40 mmol/L   TCO2 32 0 - 100 mmol/L   Hemoglobin 15.0 12.0 - 15.0 g/dL   HCT 44.0 36.0 - 46.0 %   *Note: Due to a large number of results and/or encounters for the requested time period, some results have not been displayed. A complete set of results can be found in Results Review.   No results found.  Initial Impression / Assessment and Plan / ED Course  I have reviewed the triage vital signs and the nursing notes.  Pertinent labs & imaging results that were available during my care of the patient were reviewed by me and considered in my medical decision making (see chart for details).     7:05 PM patient awake alert feels ready to go home after treatment with intravenous fluids. I spoke with Dr.Coldonato, nephrologist who will arrange to looking the patient's bleeding fistula whether it requires changing heparin dose during dialysis or study with fistulogram Final Clinical Impressions(s) / ED Diagnoses  Diagnosis#1 bleeding dialysis fistula #2 hyperglycemia Final diagnoses:  None    New Prescriptions New Prescriptions   No medications on file     Orlie Dakin, MD 12/26/16 1914

## 2017-01-13 DIAGNOSIS — I871 Compression of vein: Secondary | ICD-10-CM | POA: Diagnosis not present

## 2017-01-13 DIAGNOSIS — N186 End stage renal disease: Secondary | ICD-10-CM | POA: Diagnosis not present

## 2017-01-13 DIAGNOSIS — T82858A Stenosis of vascular prosthetic devices, implants and grafts, initial encounter: Secondary | ICD-10-CM | POA: Diagnosis not present

## 2017-01-13 DIAGNOSIS — Z992 Dependence on renal dialysis: Secondary | ICD-10-CM | POA: Diagnosis not present

## 2017-01-14 ENCOUNTER — Other Ambulatory Visit: Payer: Self-pay | Admitting: Endocrinology

## 2017-01-14 DIAGNOSIS — Z992 Dependence on renal dialysis: Secondary | ICD-10-CM | POA: Diagnosis not present

## 2017-01-14 DIAGNOSIS — N186 End stage renal disease: Secondary | ICD-10-CM | POA: Diagnosis not present

## 2017-01-14 DIAGNOSIS — E1121 Type 2 diabetes mellitus with diabetic nephropathy: Secondary | ICD-10-CM | POA: Diagnosis not present

## 2017-01-14 DIAGNOSIS — D689 Coagulation defect, unspecified: Secondary | ICD-10-CM | POA: Diagnosis not present

## 2017-01-14 DIAGNOSIS — N2581 Secondary hyperparathyroidism of renal origin: Secondary | ICD-10-CM | POA: Diagnosis not present

## 2017-01-14 DIAGNOSIS — D631 Anemia in chronic kidney disease: Secondary | ICD-10-CM | POA: Diagnosis not present

## 2017-01-14 DIAGNOSIS — N10 Acute tubulo-interstitial nephritis: Secondary | ICD-10-CM | POA: Diagnosis not present

## 2017-01-16 DIAGNOSIS — D689 Coagulation defect, unspecified: Secondary | ICD-10-CM | POA: Diagnosis not present

## 2017-01-16 DIAGNOSIS — D631 Anemia in chronic kidney disease: Secondary | ICD-10-CM | POA: Diagnosis not present

## 2017-01-16 DIAGNOSIS — N186 End stage renal disease: Secondary | ICD-10-CM | POA: Diagnosis not present

## 2017-01-16 DIAGNOSIS — E1121 Type 2 diabetes mellitus with diabetic nephropathy: Secondary | ICD-10-CM | POA: Diagnosis not present

## 2017-01-16 DIAGNOSIS — N2581 Secondary hyperparathyroidism of renal origin: Secondary | ICD-10-CM | POA: Diagnosis not present

## 2017-02-01 DIAGNOSIS — R69 Illness, unspecified: Secondary | ICD-10-CM | POA: Diagnosis not present

## 2017-02-14 DIAGNOSIS — N10 Acute tubulo-interstitial nephritis: Secondary | ICD-10-CM | POA: Diagnosis not present

## 2017-02-14 DIAGNOSIS — Z992 Dependence on renal dialysis: Secondary | ICD-10-CM | POA: Diagnosis not present

## 2017-02-14 DIAGNOSIS — N186 End stage renal disease: Secondary | ICD-10-CM | POA: Diagnosis not present

## 2017-02-15 DIAGNOSIS — D689 Coagulation defect, unspecified: Secondary | ICD-10-CM | POA: Diagnosis not present

## 2017-02-15 DIAGNOSIS — N2581 Secondary hyperparathyroidism of renal origin: Secondary | ICD-10-CM | POA: Diagnosis not present

## 2017-02-15 DIAGNOSIS — N186 End stage renal disease: Secondary | ICD-10-CM | POA: Diagnosis not present

## 2017-02-15 DIAGNOSIS — E1121 Type 2 diabetes mellitus with diabetic nephropathy: Secondary | ICD-10-CM | POA: Diagnosis not present

## 2017-02-18 DIAGNOSIS — N186 End stage renal disease: Secondary | ICD-10-CM | POA: Diagnosis not present

## 2017-02-18 DIAGNOSIS — E1121 Type 2 diabetes mellitus with diabetic nephropathy: Secondary | ICD-10-CM | POA: Diagnosis not present

## 2017-02-18 DIAGNOSIS — N2581 Secondary hyperparathyroidism of renal origin: Secondary | ICD-10-CM | POA: Diagnosis not present

## 2017-02-18 DIAGNOSIS — D689 Coagulation defect, unspecified: Secondary | ICD-10-CM | POA: Diagnosis not present

## 2017-02-18 IMAGING — CR DG CHEST 1V PORT
1 series · 1 of 1 positions shown · non-contrast
Comparison: 04/26/2015

CLINICAL DATA: Cough, wheezing for 2 weeks.

EXAM:
PORTABLE CHEST 1 VIEW

[AP]
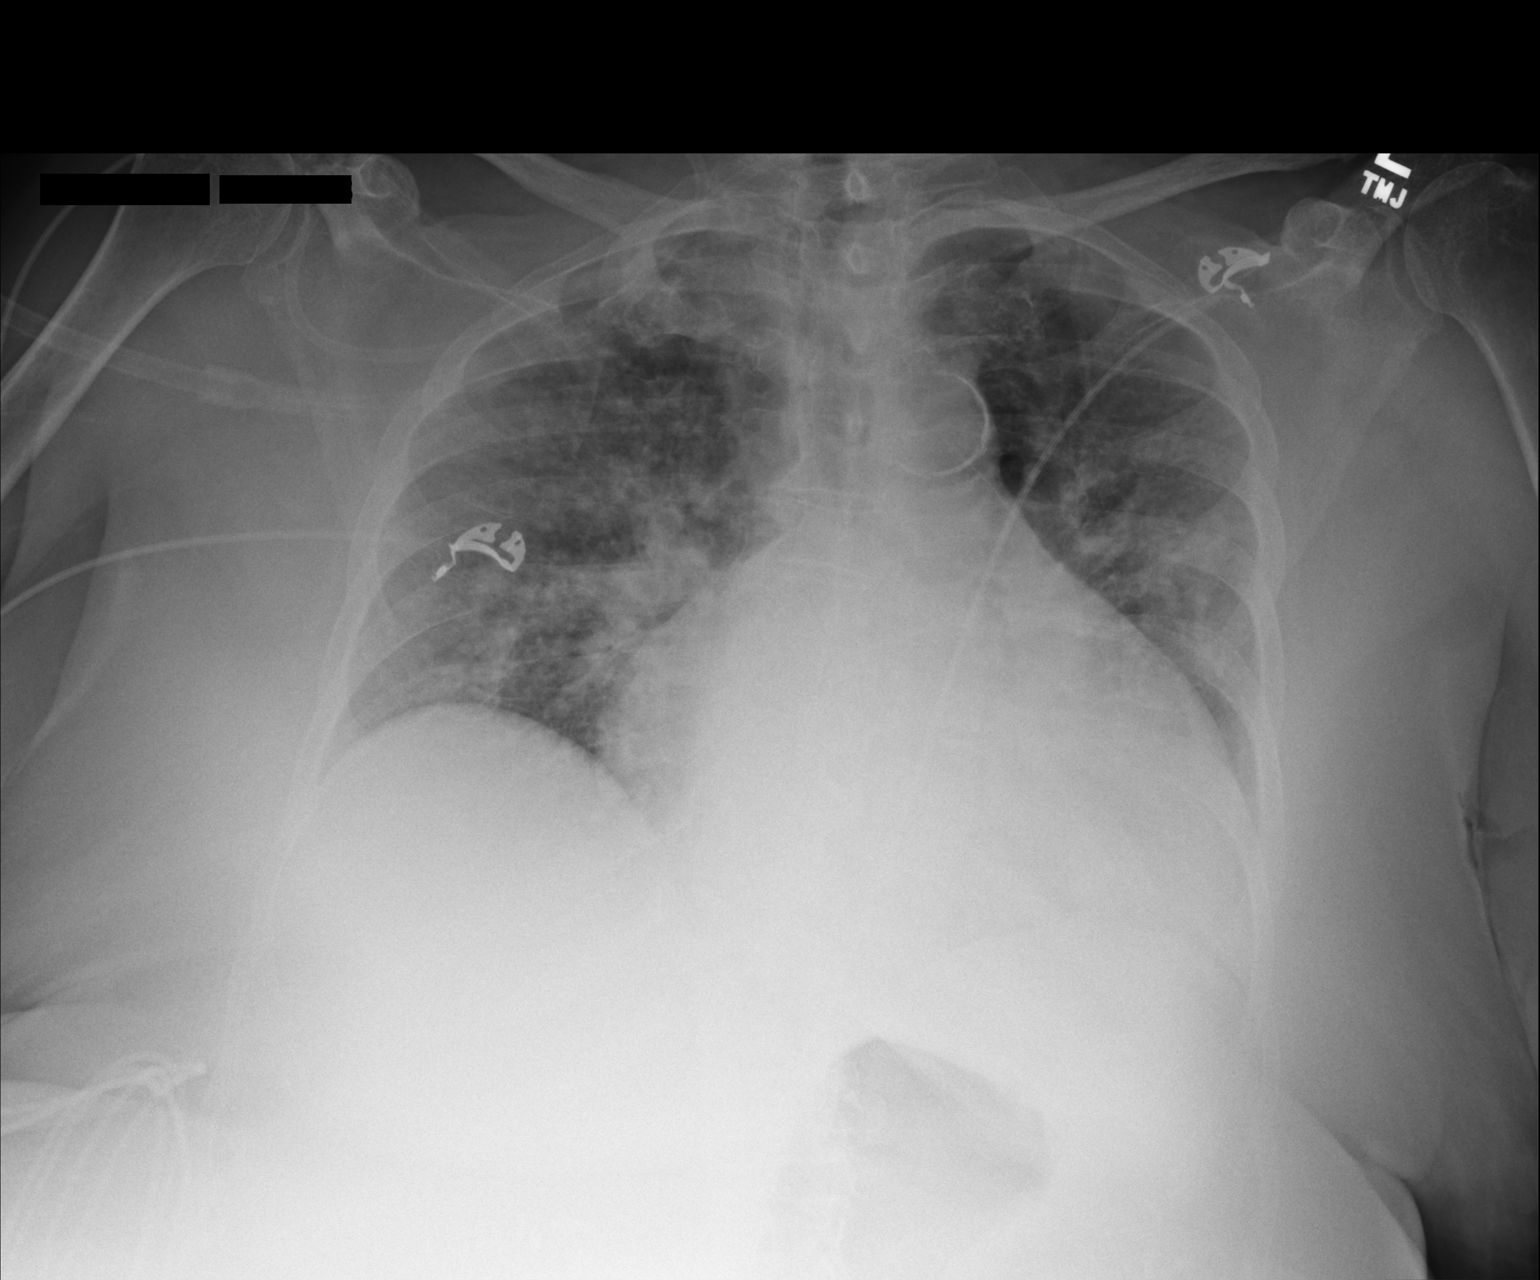

[1 of 1 positions shown; findings below may reference images not displayed]

FINDINGS: Cardiomegaly. Decreasing lung volumes with increasing perihilar and
lower lobe airspace opacities concerning for CHF. No effusions. No
acute bony abnormality.
IMPRESSION: Cardiomegaly with increasing bilateral airspace disease, likely
edema/ CHF. Low lung volumes.

## 2017-02-19 ENCOUNTER — Ambulatory Visit (INDEPENDENT_AMBULATORY_CARE_PROVIDER_SITE_OTHER): Payer: Medicare HMO | Admitting: Endocrinology

## 2017-02-19 ENCOUNTER — Encounter: Payer: Self-pay | Admitting: Endocrinology

## 2017-02-19 VITALS — BP 142/68 | HR 74 | Wt 155.0 lb

## 2017-02-19 DIAGNOSIS — Z780 Asymptomatic menopausal state: Secondary | ICD-10-CM | POA: Insufficient documentation

## 2017-02-19 DIAGNOSIS — Z794 Long term (current) use of insulin: Secondary | ICD-10-CM | POA: Diagnosis not present

## 2017-02-19 DIAGNOSIS — Z0000 Encounter for general adult medical examination without abnormal findings: Secondary | ICD-10-CM

## 2017-02-19 DIAGNOSIS — E1122 Type 2 diabetes mellitus with diabetic chronic kidney disease: Secondary | ICD-10-CM

## 2017-02-19 DIAGNOSIS — N184 Chronic kidney disease, stage 4 (severe): Secondary | ICD-10-CM | POA: Diagnosis not present

## 2017-02-19 LAB — POCT GLYCOSYLATED HEMOGLOBIN (HGB A1C): Hemoglobin A1C: 7.5

## 2017-02-19 MED ORDER — REPAGLINIDE 2 MG PO TABS: 2.0000 mg | ORAL_TABLET | Freq: Three times a day (TID) | ORAL | 11 refills | Status: AC

## 2017-02-19 NOTE — Progress Notes (Signed)
Subjective:    Patient ID: [name], female    DOB: April 27, 1930, 83 y.o.   MRN: [id number]  HPI Pt returns for f/u of diabetes mellitus: DM type: Insulin-requiring type 2 Dx'ed: 9735 Complications: polyneuropathy, ESRD, CAD, and PAD.  Therapy: insulin since 1991 GDM: never DKA: never Severe hypoglycemia: last episode was in 2014.  Pancreatitis: never Other: she took insulin 1991-2018.  Interval history: She takes repaglinide, but she also takes 70/30 insulin, 12 units approx twice a week. no cbg record, but states cbg's vary from 88-242.   Past Medical History:  Diagnosis Date  . Arthritis   . Asthma    Mild  . Bradycardia    a. Not on BB due to this.  Marland Kitchen CAD (coronary artery disease)    a. s/p multiple caths with nonobs CAD;   b. cath 1/10: pLAD 20%, mLAD 40%, pCFX 20%, mCFX 40%, pRCA 60-70%;   c.  Myoview 06/02/12: Low anterior wall scar, no ischemia, EF 37%. d. cath 06/20/2014 70% mid LCx, medical therapy, high risk for PCI due to close proximity to large OM e. cath 03/07/15 pro RCA 50% & pro to mid Cx 75%, both stable, LVEDP 33-24mm Hg-> medical management  . Cardiomyopathy ([place])    a. Echo 05/31/12: EF 40-45%. b. Echo 06/2014: EF 50-55%. c. Cath 02/2015: EF 45-50%.  . Chronic combined systolic and diastolic CHF (congestive heart failure) (HCC)    a. EF 50-55%, mild LVH and grade 1 diast. Dysfxn b. Grade 3 Diastolic Dysfunction 32/9924. b. 02/2015: EF 45-50% by cath.  . CKD (chronic kidney disease), stage III    Dr. Lorrene Reid,    dialyzes Tuesday, Thursday, Saturday  . COPD (chronic obstructive pulmonary disease) ([place])   . Depression   . Diabetes mellitus    type II; peripheral neuropathy  . Diverticulosis   . DM retinopathy ([place])   . Dyslipidemia   . GERD (gastroesophageal reflux disease)   . Hiatal hernia   . History of shingles   . Hypertension   . Noncompliance   . Obesity   . Osteoarthritis cervical spine   . Peptic stricture of esophagus   . Peptic ulcer  disease    duodenal  . Peripheral vascular disease ([place])    a. s/p L BKA 08/2012.  Marland Kitchen Pruritic condition    Idiopathic  . Shortness of breath dyspnea   . Sleep apnea    with CPAP  . Uterine cancer ([place])   . Zoster     Past Surgical History:  Procedure Laterality Date  . ABDOMINAL ANGIOGRAM N/A 08/31/2012   Procedure: ABDOMINAL ANGIOGRAM with runoff poss intervention;  Surgeon: Angelia Mould, MD;  Location: [facility] CATH LAB;  Service: Cardiovascular;  Laterality: N/A;  . AMPUTATION Left 09/04/2012   Procedure: AMPUTATION BELOW KNEE;  Surgeon: Angelia Mould, MD;  Location: [facility];  Service: Vascular;  Laterality: Left;  . AV FISTULA PLACEMENT Left 05/10/2015   Procedure: CREATION OF LEFT UPPER ARM ARTERIOVENOUS (AV) FISTULA ;  Surgeon: Elam Dutch, MD;  Location: [place];  Service: Vascular;  Laterality: Left;  . BRAIN SURGERY     1997 blood clot on the brain, then had to relieve fluid on the brain  . CARDIAC CATHETERIZATION    . CARDIAC CATHETERIZATION N/A 03/07/2015   Procedure: Left Heart Cath and Coronary Angiography;  Surgeon: Leonie Man, MD;  Location: [facility] CV LAB;  Service: Cardiovascular;  Laterality: N/A;  . CATARACT EXTRACTION, BILATERAL  2005  .  CHOLECYSTECTOMY  2010  . CRANIOTOMY  1997   Left for SDH  . DEXA  7/05  . ESOPHAGOGASTRODUODENOSCOPY  04/04/2004  . EYE SURGERY    . HERNIA REPAIR    . KNEE ARTHROSCOPY  10/1998   Left  . LEFT HEART CATHETERIZATION WITH CORONARY ANGIOGRAM N/A 06/22/2014   Procedure: LEFT HEART CATHETERIZATION WITH CORONARY ANGIOGRAM;  Surgeon: Burnell Blanks, MD;  Location[facility] C[facility] LAB;  Service: Cardiovascular;  Laterality: N/A;  . MULTIPLE EXTRACTIONS WITH ALVEOLOPLASTY N/A 08/01/2014   Procedure: EXTRACTIONS OF TEETH NUMBERS 7 8 9 10 11  AND 19 AND ALVEOLOPLASTY UPPER LEFT AND RIGHT QUADRANT;  Surgeon: Isac Caddy, DDS;  Location: [facility];  Service: Oral Surgery;  Laterality: N/A;  . SPINE SURGERY     C-spine  and lumbar surgery  . TUBAL LIGATION  1967  . TUBAL LIGATION      Social History   Social History  . Marital status: Married    Spouse name: N/A  . Number of children: 7  . Years of education: N/A   Occupational History  . Retired Retired   Social History Main Topics  . Smoking status: Never Smoker  . Smokeless tobacco: Never Used  . Alcohol use No     Comment: rare  . Drug use: No  . Sexual activity: No   Other Topics Concern  . Not on file   Social History Narrative  . No narrative on file    Current Outpatient Prescriptions on File Prior to Visit  Medication Sig Dispense Refill  . acetaminophen (TYLENOL) 500 MG tablet Take 500 mg by mouth every 6 (six) hours as needed for mild pain.    Marland Kitchen albuterol (PROVENTIL HFA;VENTOLIN HFA) 108 (90 BASE) MCG/ACT inhaler Inhale 2 puffs into the lungs every 4 (four) hours as needed for wheezing or shortness of breath. 1 Inhaler 3  . allopurinol (ZYLOPRIM) 100 MG tablet TAKE 1 TABLET(100 MG) BY MOUTH DAILY 30 tablet 5  . aspirin EC 81 MG tablet Take 1 tablet (81 mg total) by mouth daily. 90 tablet 3  . atorvastatin (LIPITOR) 40 MG tablet TAKE 1 TABLET BY MOUTH DAILY AT 6 PM 30 tablet 5  . clopidogrel (PLAVIX) 75 MG tablet Take 1 tablet (75 mg total) by mouth daily. 90 tablet 4  . colestipol (COLESTID) 1 g tablet TAKE 3 TABLETS BY MOUTH ONCE DAILY 90 tablet 1  . donepezil (ARICEPT) 5 MG tablet TAKE 1 TABLET(5 MG) BY MOUTH AT BEDTIME 30 tablet 5  . ezetimibe (ZETIA) 10 MG tablet Take 1 tablet (10 mg total) by mouth every morning. 90 tablet 2  . feeding supplement (GLUCERNA SHAKE) LIQD Take 237 mLs by mouth every morning.     . Fluticasone-Salmeterol (ADVAIR DISKUS) 100-50 MCG/DOSE AEPB Inhale 1 puff into the lungs 2 (two) times daily. 1 each 11  . gabapentin (NEURONTIN) 100 MG capsule Take 1 capsule (100 mg total) by mouth at bedtime. 90 capsule 3  . glucose blood (ONE TOUCH ULTRA TEST) test strip USE TO TEST BLOOD SUGAR TWICE DAILY 100  each 2  . hydrALAZINE (APRESOLINE) 25 MG tablet TAKE 1 TABLET(25 MG) BY MOUTH THREE TIMES DAILY 270 tablet 1  . isosorbide mononitrate (IMDUR) 60 MG 24 hr tablet Take 1 tablet (60 mg total) by mouth daily. 90 tablet 2  . latanoprost (XALATAN) 0.005 % ophthalmic solution Place 1 drop into both eyes at bedtime.    . memantine (NAMENDA) 5 MG tablet Take 1 tablet (5  mg total) by mouth 2 (two) times daily. 90 tablet 2  . nitroGLYCERIN (NITROSTAT) 0.4 MG SL tablet Place 0.4 mg under the tongue every 5 (five) minutes as needed for chest pain.     . pantoprazole (PROTONIX) 40 MG tablet TAKE 1 TABLET BY MOUTH TWICE DAILY 60 tablet 2  . RANEXA 500 MG 12 hr tablet TAKE 1 TABLET BY MOUTH EVERY 12 HOURS 60 tablet 1  . sertraline (ZOLOFT) 100 MG tablet TAKE 1 TABLET(100 MG) BY MOUTH DAILY 90 tablet 2   No current facility-administered medications on file prior to visit.     Allergies  Allergen Reactions  . Iohexol Anaphylaxis, Itching and Rash     Code: RASH, Desc: [place] ON PT'S CHART ALLERGIC TO IV DYE 09/04/07/RM, Onset Date: [id number]     Family History  Problem Relation Age of Onset  . Cancer - Other Mother        "Stomach" Cancer  . Diabetes Mother   . Heart disease Mother   . Stomach cancer Mother   . Hypertension Mother   . Rheum arthritis Mother   . Lymphoma Father   . Hypertension Father   . Rheum arthritis Father   . Kidney disease Paternal Grandmother   . Stroke Paternal Grandmother   . Asthma Other   . Diabetes Sister   . Heart attack Neg Hx     BP (!) 142/68   Pulse 74   Wt 155 lb (70.3 kg)   SpO2 98%   BMI 25.02 kg/m    Review of Systems She denies hypoglycemia    Objective:   Physical Exam VITAL SIGNS:  See vs page.   GENERAL: no distress.  In wheelchair.   LLE: BKA.   Right foot and ankle: no deformity.  There is bilateral onychomycosis.  Skin: no ulcer, and normal color and temp, on the right foot and ankle.  Neuro: sensation is intact to touch on  the right foot.  CV: 1+ right leg and ankle edema.    Lab Results  Component Value Date   HGBA1C 7.5 02/19/2017      Assessment & Plan:  Type 2 DM: she can d/c insulin  I have sent a prescription to your pharmacy, to again double the repaglinide.  This way, you won't have to take the insulin.   Subjective:   Patient here for Medicare annual wellness visit and management of other chronic and acute problems.     Risk factors: advanced age    40 of Physicians Providing Medical Care to Patient:  See "snapshot"   Activities of Daily Living: In your present state of health, do you have any difficulty performing the following activities (lives with husband)?:  Preparing food and eating?: No  Bathing yourself: No  Getting dressed: No  Using the toilet:No  Moving around from place to place: No  In the past year have you fallen or had a near fall?:No    Home Safety: Has smoke detector and wears seat belts. firearms are safely stored  Diet and Exercise  Current exercise habits: limited by health probs (uses walker) Dietary issues discussed: pt reports a healthy diet   Depression Screen  Q1: Over the past two weeks, have you felt down, depressed or hopeless? no  Q2: Over the past two weeks, have you felt little interest or pleasure in doing things? no   The following portions of the patient's history were reviewed and updated as appropriate: allergies, current medications, past family  history, past medical history, past social history, past surgical history and problem list.   Review of Systems  Denies hearing loss, and visual loss Objective:   Vision:  Sees opthalmologist Hearing: grossly normal Body mass index:  See vs page Msk: pt easily and quickly performs "get-up-and-go" from a sitting position.   Cognitive Impairment Assessment: cognition, memory and judgment appear normal.  remembers 2/3 at 5 minutes.  excellent recall.  can easily read and write a sentence.  alert  and oriented x 3.     Assessment:   Medicare wellness utd on preventive parameters.    Plan:   During the course of the visit the patient was educated and counseled about appropriate screening and preventive services including:       Fall prevention   Screening mammography  Bone densitometry screening  Diabetes screening  Nutrition counseling   Vaccines are updated as needed  Patient Instructions (the written plan) was given to the patient.

## 2017-02-19 NOTE — Progress Notes (Signed)
we discussed code status.  pt requests full code, but would not want to be started or maintained on artificial life-support measures if there was not a reasonable chance of recovery 

## 2017-02-19 NOTE — Patient Instructions (Addendum)
I have sent a prescription to your pharmacy, to again double the repaglinide.  This way, you won't have to take the insulin.  good diet and exercise significantly improve the control of your diabetes.  please let me know if you wish to be referred to a dietician.  high blood sugar is very risky to your health.  you should see an eye doctor and dentist every year.  It is very important to get all recommended vaccinations.   check your blood sugar once a day.  vary the time of day when you check, between before the 3 meals, and at bedtime.  also check if you have symptoms of your blood sugar being too high or too low.  please keep a record of the readings and bring it to your next appointment here (or you can bring the meter itself).  You can write it on any piece of paper.  please call us sooner if your blood sugar goes below 70, or if you have a lot of readings over 200.    Please consider these measures for your health:  minimize alcohol.  Do not use tobacco products.  Have a colonoscopy at least every 10 years from age 3.  Keep firearms safely stored.  Always use seat belts.  have working smoke alarms in your home.  See an eye doctor and dentist regularly.  Never drive under the influence of alcohol or drugs (including prescription drugs).  Those with fair skin should take precautions against the sun, and should carefully examine their skin once per month, for any new or changed moles. It is critically important to prevent falling down (keep floor areas well-lit, dry, and free of loose objects.  If you have a cane, walker, or wheelchair, you should use it, even for short trips around the house.  Wear flat-soled shoes.  Also, try not to rush).   Please come back for a follow-up appointment in 4 months.

## 2017-02-20 DIAGNOSIS — D689 Coagulation defect, unspecified: Secondary | ICD-10-CM | POA: Diagnosis not present

## 2017-02-20 DIAGNOSIS — N2581 Secondary hyperparathyroidism of renal origin: Secondary | ICD-10-CM | POA: Diagnosis not present

## 2017-02-20 DIAGNOSIS — E1121 Type 2 diabetes mellitus with diabetic nephropathy: Secondary | ICD-10-CM | POA: Diagnosis not present

## 2017-02-20 DIAGNOSIS — N186 End stage renal disease: Secondary | ICD-10-CM | POA: Diagnosis not present

## 2017-02-22 DIAGNOSIS — N186 End stage renal disease: Secondary | ICD-10-CM | POA: Diagnosis not present

## 2017-02-22 DIAGNOSIS — E1121 Type 2 diabetes mellitus with diabetic nephropathy: Secondary | ICD-10-CM | POA: Diagnosis not present

## 2017-02-22 DIAGNOSIS — N2581 Secondary hyperparathyroidism of renal origin: Secondary | ICD-10-CM | POA: Diagnosis not present

## 2017-02-22 DIAGNOSIS — D689 Coagulation defect, unspecified: Secondary | ICD-10-CM | POA: Diagnosis not present

## 2017-02-23 IMAGING — RF DG SWALLOWING FUNCTION - NRPT [facility]
1 series · 18 of 24 positions shown · non-contrast
Comparison: none

[Series 1: run · 19 acquisitions, 18 frames shown]
[im 1/19]
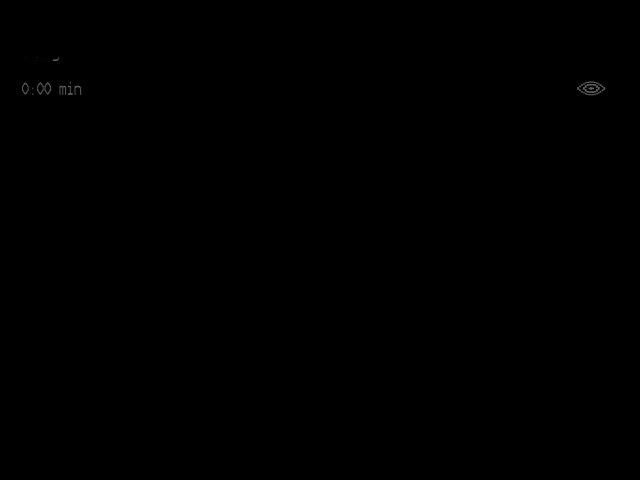
[im 2/19]
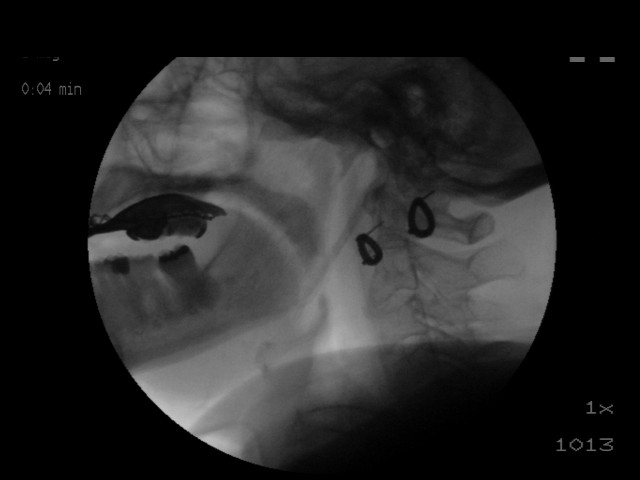
[im 3/19]
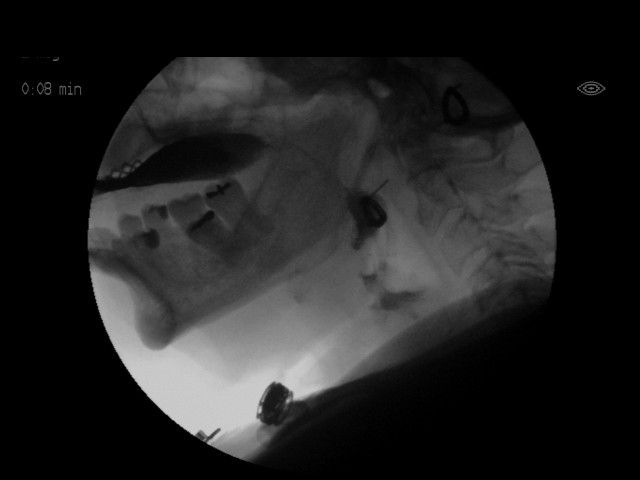
[im 4/19]
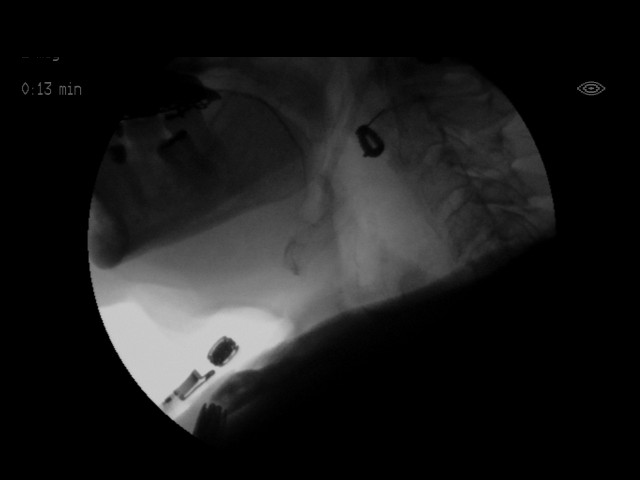
[im 5/19]
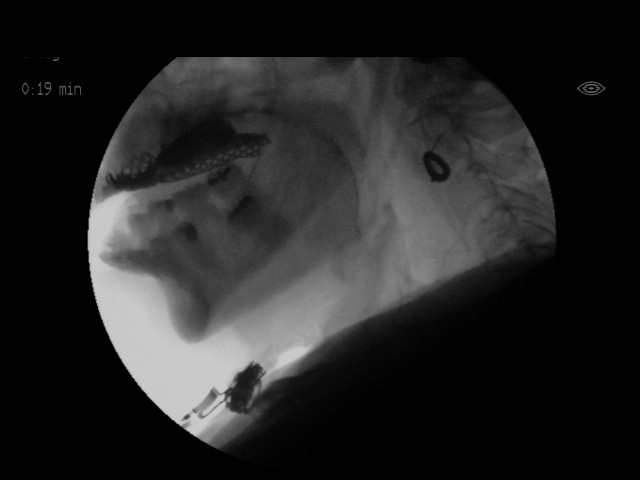
[im 6/19]
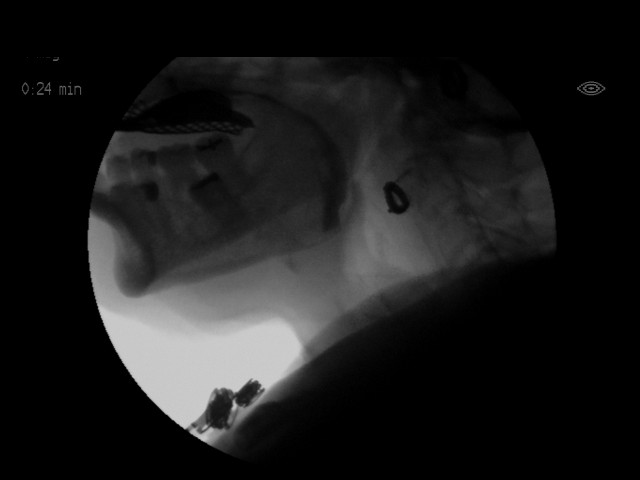
[im 7/19]
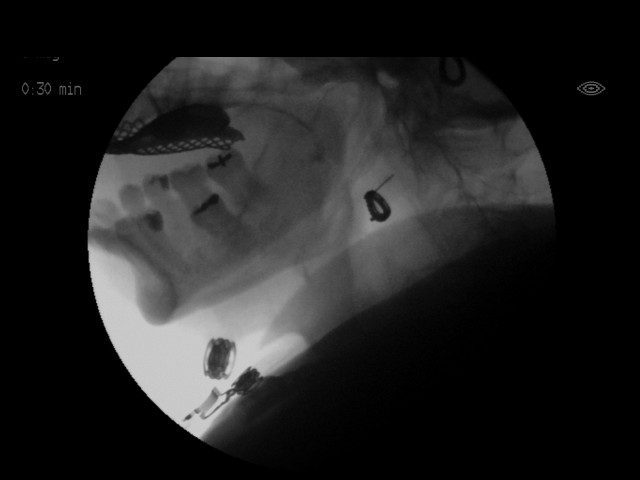
[im 9/19]
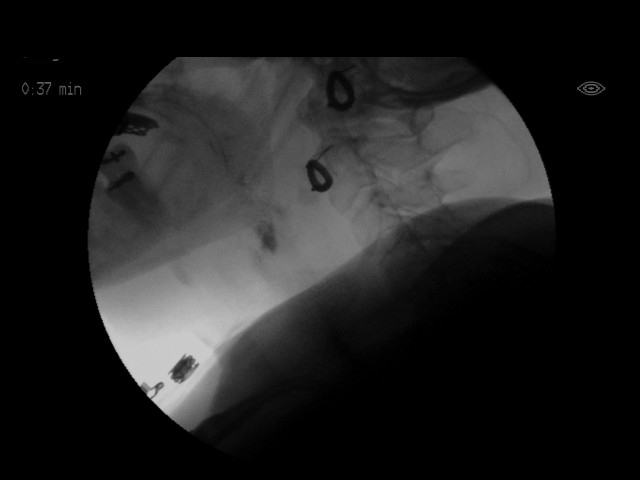
[im 10/19]
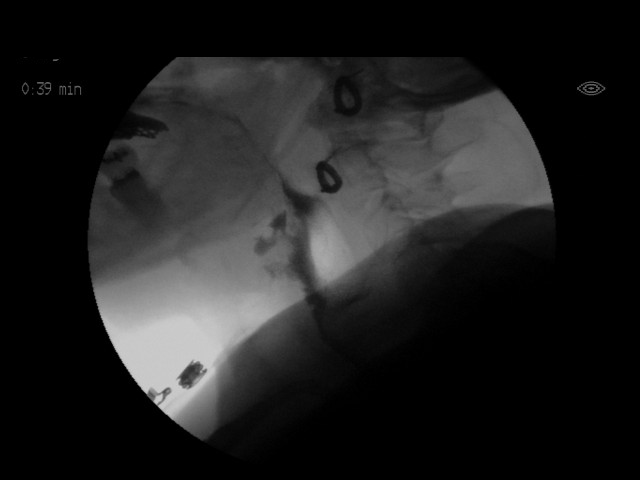
[im 10/19]
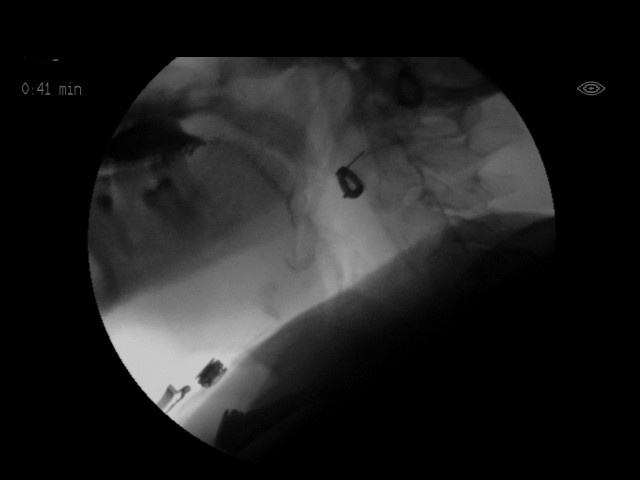
[im 12/19]
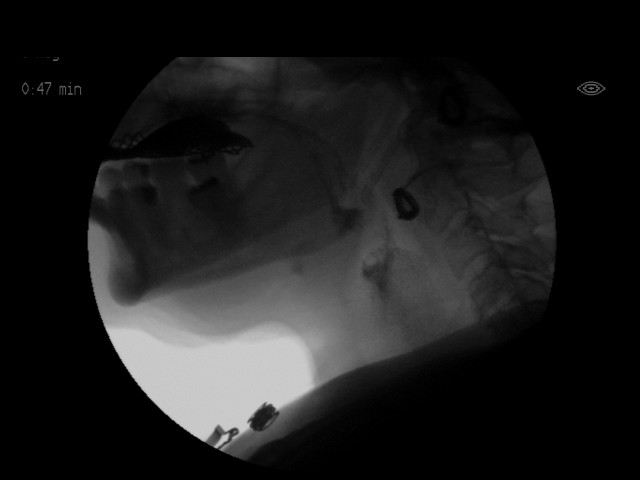
[im 13/19]
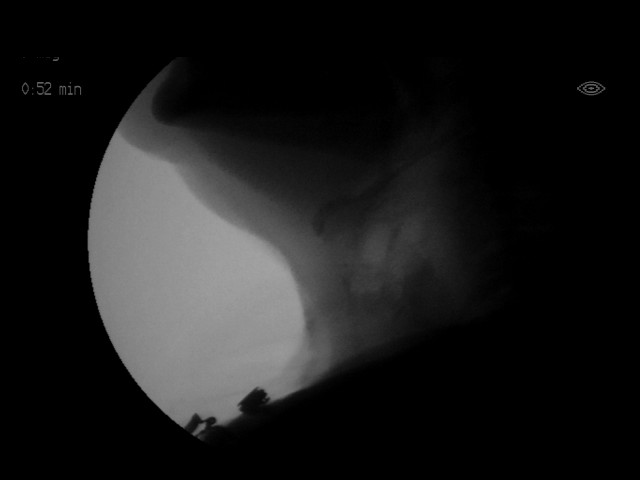
[im 14/19]
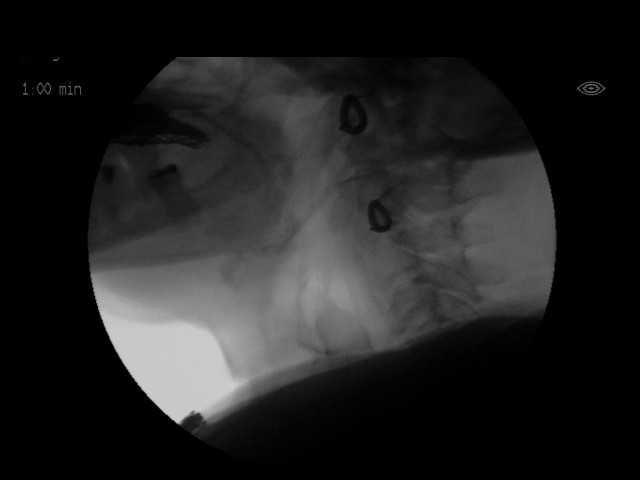
[im 15/19]
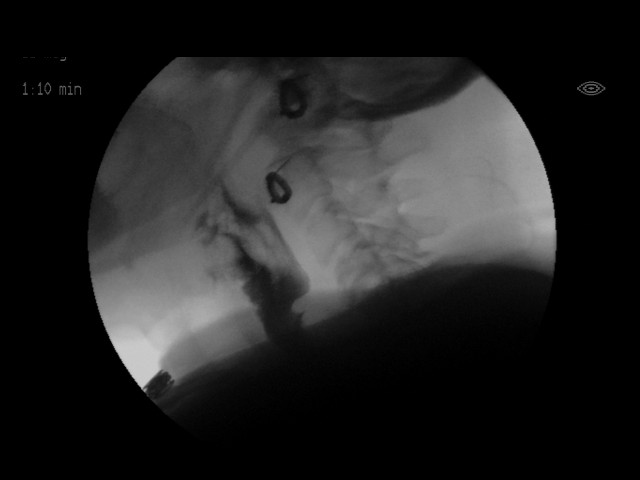
[im 16/19]
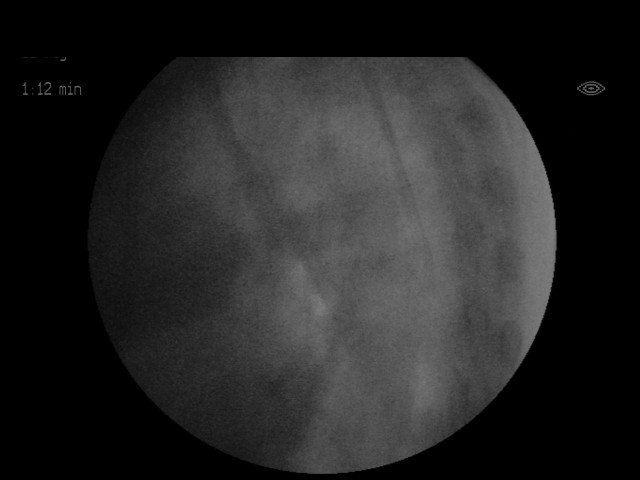
[im 17/19]
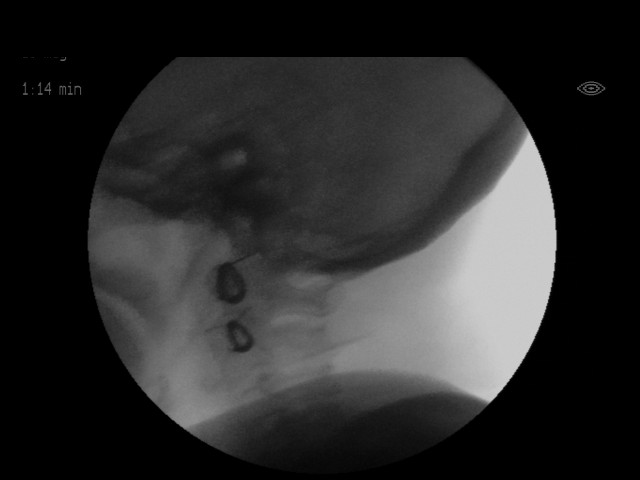
[im 18/19]
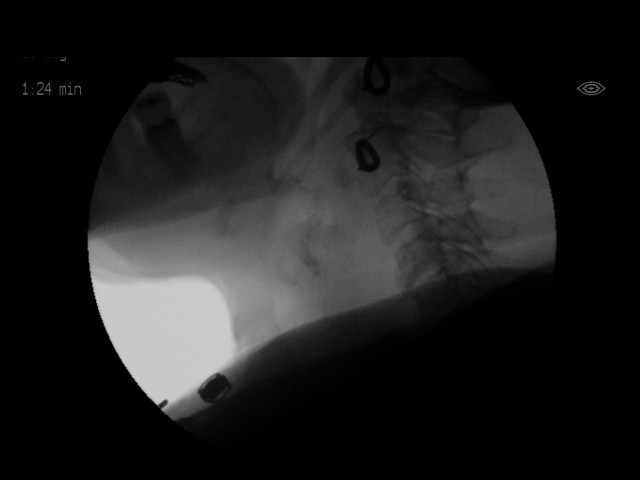
[im 19/19]
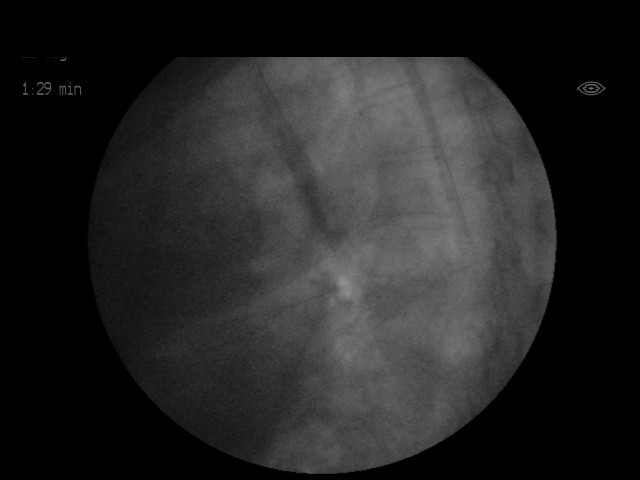

[18 of 24 positions shown; findings below may reference images not displayed]

FLUOROSCOPY FOR SWALLOWING FUNCTION STUDY:
Fluoroscopy was provided for swallowing function study, which was administered by a speech pathologist.  Final results and recommendations from this study are contained within the speech pathology report.

## 2017-02-24 ENCOUNTER — Other Ambulatory Visit: Payer: Self-pay | Admitting: Endocrinology

## 2017-02-24 MED ORDER — COLESTIPOL HCL 1 G PO TABS: 3.0000 g | ORAL_TABLET | Freq: Every day | ORAL | 1 refills | Status: DC

## 2017-02-24 NOTE — Telephone Encounter (Signed)
MEDICATION: colestipol (COLESTID) 1 g tablet  PHARMACY:   [facility], [place] [phone] (Phone) [phone] (Fax)   IS THIS A 90 DAY SUPPLY : yes  IS PATIENT OUT OF MEDICATION: yes  IF NOT; HOW MUCH IS LEFT: n/a  LAST APPOINTMENT DATE: @7 /03/2017  NEXT APPOINTMENT DATE:06/20/17  OTHER COMMENTS: Remove all other pharmacies other than [facility] on [address]   **Let patient know to contact pharmacy at the end of the day to make sure medication is ready. **  ** Please notify patient to allow 48-72 hours to process**  **Encourage patient to contact the pharmacy for refills or they can request refills through [facility]**

## 2017-02-24 NOTE — Telephone Encounter (Signed)
Rx sent 

## 2017-02-25 DIAGNOSIS — E1121 Type 2 diabetes mellitus with diabetic nephropathy: Secondary | ICD-10-CM | POA: Diagnosis not present

## 2017-02-25 DIAGNOSIS — N2581 Secondary hyperparathyroidism of renal origin: Secondary | ICD-10-CM | POA: Diagnosis not present

## 2017-02-25 DIAGNOSIS — D689 Coagulation defect, unspecified: Secondary | ICD-10-CM | POA: Diagnosis not present

## 2017-02-25 DIAGNOSIS — N186 End stage renal disease: Secondary | ICD-10-CM | POA: Diagnosis not present

## 2017-02-25 NOTE — Telephone Encounter (Signed)
The   colestipol (COLESTID) 1 g tablet   was sent to the incorrect pharmacy. Please resend the medication to   [facility], [place]

## 2017-02-26 ENCOUNTER — Other Ambulatory Visit: Payer: Self-pay

## 2017-02-26 ENCOUNTER — Other Ambulatory Visit: Payer: Self-pay | Admitting: Cardiology

## 2017-02-26 MED ORDER — COLESTIPOL HCL 1 G PO TABS: 3.0000 g | ORAL_TABLET | Freq: Every day | ORAL | 1 refills | Status: AC

## 2017-02-27 DIAGNOSIS — E1121 Type 2 diabetes mellitus with diabetic nephropathy: Secondary | ICD-10-CM | POA: Diagnosis not present

## 2017-02-27 DIAGNOSIS — N2581 Secondary hyperparathyroidism of renal origin: Secondary | ICD-10-CM | POA: Diagnosis not present

## 2017-02-27 DIAGNOSIS — N186 End stage renal disease: Secondary | ICD-10-CM | POA: Diagnosis not present

## 2017-02-27 DIAGNOSIS — D689 Coagulation defect, unspecified: Secondary | ICD-10-CM | POA: Diagnosis not present

## 2017-03-01 DIAGNOSIS — N186 End stage renal disease: Secondary | ICD-10-CM | POA: Diagnosis not present

## 2017-03-01 DIAGNOSIS — D689 Coagulation defect, unspecified: Secondary | ICD-10-CM | POA: Diagnosis not present

## 2017-03-01 DIAGNOSIS — N2581 Secondary hyperparathyroidism of renal origin: Secondary | ICD-10-CM | POA: Diagnosis not present

## 2017-03-01 DIAGNOSIS — E1121 Type 2 diabetes mellitus with diabetic nephropathy: Secondary | ICD-10-CM | POA: Diagnosis not present

## 2017-03-04 ENCOUNTER — Other Ambulatory Visit: Payer: Self-pay | Admitting: Cardiology

## 2017-03-04 DIAGNOSIS — N2581 Secondary hyperparathyroidism of renal origin: Secondary | ICD-10-CM | POA: Diagnosis not present

## 2017-03-04 DIAGNOSIS — D689 Coagulation defect, unspecified: Secondary | ICD-10-CM | POA: Diagnosis not present

## 2017-03-04 DIAGNOSIS — E1121 Type 2 diabetes mellitus with diabetic nephropathy: Secondary | ICD-10-CM | POA: Diagnosis not present

## 2017-03-04 DIAGNOSIS — N186 End stage renal disease: Secondary | ICD-10-CM | POA: Diagnosis not present

## 2017-03-04 MED ORDER — NITROGLYCERIN 0.4 MG SL SUBL: 0.4000 mg | SUBLINGUAL_TABLET | SUBLINGUAL | 3 refills | Status: AC | PRN

## 2017-03-06 DIAGNOSIS — N186 End stage renal disease: Secondary | ICD-10-CM | POA: Diagnosis not present

## 2017-03-06 DIAGNOSIS — D689 Coagulation defect, unspecified: Secondary | ICD-10-CM | POA: Diagnosis not present

## 2017-03-06 DIAGNOSIS — N2581 Secondary hyperparathyroidism of renal origin: Secondary | ICD-10-CM | POA: Diagnosis not present

## 2017-03-06 DIAGNOSIS — E1121 Type 2 diabetes mellitus with diabetic nephropathy: Secondary | ICD-10-CM | POA: Diagnosis not present

## 2017-03-08 DIAGNOSIS — N186 End stage renal disease: Secondary | ICD-10-CM | POA: Diagnosis not present

## 2017-03-08 DIAGNOSIS — E1121 Type 2 diabetes mellitus with diabetic nephropathy: Secondary | ICD-10-CM | POA: Diagnosis not present

## 2017-03-08 DIAGNOSIS — D689 Coagulation defect, unspecified: Secondary | ICD-10-CM | POA: Diagnosis not present

## 2017-03-08 DIAGNOSIS — N2581 Secondary hyperparathyroidism of renal origin: Secondary | ICD-10-CM | POA: Diagnosis not present

## 2017-03-11 DIAGNOSIS — N186 End stage renal disease: Secondary | ICD-10-CM | POA: Diagnosis not present

## 2017-03-11 DIAGNOSIS — D689 Coagulation defect, unspecified: Secondary | ICD-10-CM | POA: Diagnosis not present

## 2017-03-11 DIAGNOSIS — N2581 Secondary hyperparathyroidism of renal origin: Secondary | ICD-10-CM | POA: Diagnosis not present

## 2017-03-11 DIAGNOSIS — E1121 Type 2 diabetes mellitus with diabetic nephropathy: Secondary | ICD-10-CM | POA: Diagnosis not present

## 2017-03-13 DIAGNOSIS — D689 Coagulation defect, unspecified: Secondary | ICD-10-CM | POA: Diagnosis not present

## 2017-03-13 DIAGNOSIS — E1121 Type 2 diabetes mellitus with diabetic nephropathy: Secondary | ICD-10-CM | POA: Diagnosis not present

## 2017-03-13 DIAGNOSIS — N186 End stage renal disease: Secondary | ICD-10-CM | POA: Diagnosis not present

## 2017-03-13 DIAGNOSIS — N2581 Secondary hyperparathyroidism of renal origin: Secondary | ICD-10-CM | POA: Diagnosis not present

## 2017-03-15 DIAGNOSIS — N2581 Secondary hyperparathyroidism of renal origin: Secondary | ICD-10-CM | POA: Diagnosis not present

## 2017-03-15 DIAGNOSIS — E1121 Type 2 diabetes mellitus with diabetic nephropathy: Secondary | ICD-10-CM | POA: Diagnosis not present

## 2017-03-15 DIAGNOSIS — N186 End stage renal disease: Secondary | ICD-10-CM | POA: Diagnosis not present

## 2017-03-15 DIAGNOSIS — D689 Coagulation defect, unspecified: Secondary | ICD-10-CM | POA: Diagnosis not present

## 2017-03-16 DIAGNOSIS — N186 End stage renal disease: Secondary | ICD-10-CM | POA: Diagnosis not present

## 2017-03-16 DIAGNOSIS — N10 Acute tubulo-interstitial nephritis: Secondary | ICD-10-CM | POA: Diagnosis not present

## 2017-03-16 DIAGNOSIS — Z992 Dependence on renal dialysis: Secondary | ICD-10-CM | POA: Diagnosis not present

## 2017-03-17 DIAGNOSIS — N186 End stage renal disease: Secondary | ICD-10-CM | POA: Diagnosis not present

## 2017-03-17 DIAGNOSIS — N10 Acute tubulo-interstitial nephritis: Secondary | ICD-10-CM | POA: Diagnosis not present

## 2017-03-17 DIAGNOSIS — Z992 Dependence on renal dialysis: Secondary | ICD-10-CM | POA: Diagnosis not present

## 2017-03-18 DIAGNOSIS — N186 End stage renal disease: Secondary | ICD-10-CM | POA: Diagnosis not present

## 2017-03-18 DIAGNOSIS — N2581 Secondary hyperparathyroidism of renal origin: Secondary | ICD-10-CM | POA: Diagnosis not present

## 2017-03-18 DIAGNOSIS — R52 Pain, unspecified: Secondary | ICD-10-CM | POA: Diagnosis not present

## 2017-03-18 DIAGNOSIS — R509 Fever, unspecified: Secondary | ICD-10-CM | POA: Diagnosis not present

## 2017-03-18 DIAGNOSIS — D689 Coagulation defect, unspecified: Secondary | ICD-10-CM | POA: Diagnosis not present

## 2017-03-20 DIAGNOSIS — N186 End stage renal disease: Secondary | ICD-10-CM | POA: Diagnosis not present

## 2017-03-20 DIAGNOSIS — R52 Pain, unspecified: Secondary | ICD-10-CM | POA: Diagnosis not present

## 2017-03-20 DIAGNOSIS — D689 Coagulation defect, unspecified: Secondary | ICD-10-CM | POA: Diagnosis not present

## 2017-03-20 DIAGNOSIS — R509 Fever, unspecified: Secondary | ICD-10-CM | POA: Diagnosis not present

## 2017-03-20 DIAGNOSIS — N2581 Secondary hyperparathyroidism of renal origin: Secondary | ICD-10-CM | POA: Diagnosis not present

## 2017-03-22 DIAGNOSIS — R52 Pain, unspecified: Secondary | ICD-10-CM | POA: Diagnosis not present

## 2017-03-22 DIAGNOSIS — N186 End stage renal disease: Secondary | ICD-10-CM | POA: Diagnosis not present

## 2017-03-22 DIAGNOSIS — D689 Coagulation defect, unspecified: Secondary | ICD-10-CM | POA: Diagnosis not present

## 2017-03-22 DIAGNOSIS — N2581 Secondary hyperparathyroidism of renal origin: Secondary | ICD-10-CM | POA: Diagnosis not present

## 2017-03-22 DIAGNOSIS — R509 Fever, unspecified: Secondary | ICD-10-CM | POA: Diagnosis not present

## 2017-03-25 DIAGNOSIS — N2581 Secondary hyperparathyroidism of renal origin: Secondary | ICD-10-CM | POA: Diagnosis not present

## 2017-03-25 DIAGNOSIS — D689 Coagulation defect, unspecified: Secondary | ICD-10-CM | POA: Diagnosis not present

## 2017-03-25 DIAGNOSIS — R509 Fever, unspecified: Secondary | ICD-10-CM | POA: Diagnosis not present

## 2017-03-25 DIAGNOSIS — R52 Pain, unspecified: Secondary | ICD-10-CM | POA: Diagnosis not present

## 2017-03-25 DIAGNOSIS — N186 End stage renal disease: Secondary | ICD-10-CM | POA: Diagnosis not present

## 2017-03-26 ENCOUNTER — Ambulatory Visit (INDEPENDENT_AMBULATORY_CARE_PROVIDER_SITE_OTHER): Payer: Medicare HMO | Admitting: Endocrinology

## 2017-03-26 ENCOUNTER — Encounter: Payer: Self-pay | Admitting: Endocrinology

## 2017-03-26 VITALS — BP 118/60 | HR 66 | Wt 136.0 lb

## 2017-03-26 DIAGNOSIS — E1122 Type 2 diabetes mellitus with diabetic chronic kidney disease: Secondary | ICD-10-CM

## 2017-03-26 DIAGNOSIS — Z794 Long term (current) use of insulin: Secondary | ICD-10-CM

## 2017-03-26 DIAGNOSIS — N184 Chronic kidney disease, stage 4 (severe): Secondary | ICD-10-CM

## 2017-03-26 DIAGNOSIS — R079 Chest pain, unspecified: Secondary | ICD-10-CM | POA: Diagnosis not present

## 2017-03-26 MED ORDER — ISOSORBIDE MONONITRATE ER 30 MG PO TB24: 90.0000 mg | ORAL_TABLET | Freq: Every day | ORAL | 0 refills | Status: AC

## 2017-03-26 NOTE — Patient Instructions (Addendum)
I have sent a prescription to your pharmacy, to increase the isosorbide to 90 mg daily. Please also take the as-needed nitros, when you have chest pain Please see Dr Meda Coffee next week as scheduled.

## 2017-03-26 NOTE — Progress Notes (Signed)
Subjective:    Patient ID: [name], female    DOB: Oct 29, 1929, 83 y.o.   MRN: [id number]  HPI  Pt returns for f/u of diabetes mellitus: DM type: Insulin-requiring type 2 Dx'ed: 2993 Complications: polyneuropathy, ESRD, CAD, and PAD.  Therapy: repaglinide. GDM: never DKA: never Severe hypoglycemia: last episode was in 2014.  Pancreatitis: never Other: she took insulin 1991-2018.  Interval history: no cbg record, but pt says cbg's have increased to 200 recently.  Pt says she gets mild chest pain approx once per day. She has assoc sob and dizziness.  It lasts approx 2-3 mins at a time, and is promptly relieved with SL-NTG.   Past Medical History:  Diagnosis Date  . Arthritis   . Asthma    Mild  . Bradycardia    a. Not on BB due to this.  Marland Kitchen CAD (coronary artery disease)    a. s/p multiple caths with nonobs CAD;   b. cath 1/10: pLAD 20%, mLAD 40%, pCFX 20%, mCFX 40%, pRCA 60-70%;   c.  Myoview 06/02/12: Low anterior wall scar, no ischemia, EF 37%. d. cath 06/20/2014 70% mid LCx, medical therapy, high risk for PCI due to close proximity to large OM e. cath 03/07/15 pro RCA 50% & pro to mid Cx 75%, both stable, LVEDP 33-15mm Hg-> medical management  . Cardiomyopathy ([place])    a. Echo 05/31/12: EF 40-45%. b. Echo 06/2014: EF 50-55%. c. Cath 02/2015: EF 45-50%.  . Chronic combined systolic and diastolic CHF (congestive heart failure) (HCC)    a. EF 50-55%, mild LVH and grade 1 diast. Dysfxn b. Grade 3 Diastolic Dysfunction 71/6967. b. 02/2015: EF 45-50% by cath.  . CKD (chronic kidney disease), stage III [facility])    Dr. Lorrene Reid,    dialyzes Tuesday, Thursday, Saturday  . COPD (chronic obstructive pulmonary disease) ([place])   . Depression   . Diabetes mellitus    type II; peripheral neuropathy  . Diverticulosis   . DM retinopathy ([place])   . Dyslipidemia   . GERD (gastroesophageal reflux disease)   . Hiatal hernia   . History of shingles   . Hypertension   . Noncompliance   .  Obesity   . Osteoarthritis cervical spine   . Peptic stricture of esophagus   . Peptic ulcer disease    duodenal  . Peripheral vascular disease ([place])    a. s/p L BKA 08/2012.  Marland Kitchen Pruritic condition    Idiopathic  . Shortness of breath dyspnea   . Sleep apnea    with CPAP  . Uterine cancer ([place])   . Zoster     Past Surgical History:  Procedure Laterality Date  . ABDOMINAL ANGIOGRAM N/A 08/31/2012   Procedure: ABDOMINAL ANGIOGRAM with runoff poss intervention;  Surgeon: Angelia Mould, MD;  Location: [facility] CATH LAB;  Service: Cardiovascular;  Laterality: N/A;  . AMPUTATION Left 09/04/2012   Procedure: AMPUTATION BELOW KNEE;  Surgeon: Angelia Mould, MD;  Location: [place];  Service: Vascular;  Laterality: Left;  . AV FISTULA PLACEMENT Left 05/10/2015   Procedure: CREATION OF LEFT UPPER ARM ARTERIOVENOUS (AV) FISTULA ;  Surgeon: Elam Dutch, MD;  Location: [facility];  Service: Vascular;  Laterality: Left;  . BRAIN SURGERY     1997 blood clot on the brain, then had to relieve fluid on the brain  . CARDIAC CATHETERIZATION    . CARDIAC CATHETERIZATION N/A 03/07/2015   Procedure: Left Heart Cath and Coronary Angiography;  Surgeon: Leonie Green  Ellyn Hack, MD;  Location: [place] [facility] CV LAB;  Service: Cardiovascular;  Laterality: N/A;  . CATARACT EXTRACTION, BILATERAL  2005  . CHOLECYSTECTOMY  2010  . CRANIOTOMY  1997   Left for SDH  . DEXA  7/05  . ESOPHAGOGASTRODUODENOSCOPY  04/04/2004  . EYE SURGERY    . HERNIA REPAIR    . KNEE ARTHROSCOPY  10/1998   Left  . LEFT HEART CATHETERIZATION WITH CORONARY ANGIOGRAM N/A 06/22/2014   Procedure: LEFT HEART CATHETERIZATION WITH CORONARY ANGIOGRAM;  Surgeon: Burnell Blanks, MD;  Location: [facility] CATH LAB;  Service: Cardiovascular;  Laterality: N/A;  . MULTIPLE EXTRACTIONS WITH ALVEOLOPLASTY N/A 08/01/2014   Procedure: EXTRACTIONS OF TEETH NUMBERS 7 8 9 10 11  AND 19 AND ALVEOLOPLASTY UPPER LEFT AND RIGHT QUADRANT;  Surgeon: Isac Caddy,  DDS;  Location: [place];  Service: Oral Surgery;  Laterality: N/A;  . SPINE SURGERY     C-spine and lumbar surgery  . TUBAL LIGATION  1967  . TUBAL LIGATION      Social History   Social History  . Marital status: Married    Spouse name: N/A  . Number of children: 7  . Years of education: N/A   Occupational History  . Retired Retired   Social History Main Topics  . Smoking status: Never Smoker  . Smokeless tobacco: Never Used  . Alcohol use No     Comment: rare  . Drug use: No  . Sexual activity: No   Other Topics Concern  . Not on file   Social History Narrative  . No narrative on file    Current Outpatient Prescriptions on File Prior to Visit  Medication Sig Dispense Refill  . acetaminophen (TYLENOL) 500 MG tablet Take 500 mg by mouth every 6 (six) hours as needed for mild pain.    Marland Kitchen albuterol (PROVENTIL HFA;VENTOLIN HFA) 108 (90 BASE) MCG/ACT inhaler Inhale 2 puffs into the lungs every 4 (four) hours as needed for wheezing or shortness of breath. 1 Inhaler 3  . allopurinol (ZYLOPRIM) 100 MG tablet TAKE 1 TABLET(100 MG) BY MOUTH DAILY 30 tablet 5  . aspirin EC 81 MG tablet Take 1 tablet (81 mg total) by mouth daily. 90 tablet 3  . atorvastatin (LIPITOR) 40 MG tablet TAKE 1 TABLET BY MOUTH DAILY AT 6 PM 30 tablet 5  . clopidogrel (PLAVIX) 75 MG tablet Take 1 tablet (75 mg total) by mouth daily. 90 tablet 4  . colestipol (COLESTID) 1 g tablet Take 3 tablets (3 g total) by mouth daily. 90 tablet 1  . donepezil (ARICEPT) 5 MG tablet TAKE 1 TABLET(5 MG) BY MOUTH AT BEDTIME 30 tablet 5  . ezetimibe (ZETIA) 10 MG tablet Take 1 tablet (10 mg total) by mouth every morning. 90 tablet 2  . feeding supplement (GLUCERNA SHAKE) LIQD Take 237 mLs by mouth every morning.     . Fluticasone-Salmeterol (ADVAIR DISKUS) 100-50 MCG/DOSE AEPB Inhale 1 puff into the lungs 2 (two) times daily. 1 each 11  . gabapentin (NEURONTIN) 100 MG capsule Take 1 capsule (100 mg total) by mouth at bedtime. 90  capsule 3  . glucose blood (ONE TOUCH ULTRA TEST) test strip USE TO TEST BLOOD SUGAR TWICE DAILY 100 each 2  . hydrALAZINE (APRESOLINE) 25 MG tablet TAKE 1 TABLET(25 MG) BY MOUTH THREE TIMES DAILY 270 tablet 1  . latanoprost (XALATAN) 0.005 % ophthalmic solution Place 1 drop into both eyes at bedtime.    . memantine (NAMENDA) 5 MG tablet Take  1 tablet (5 mg total) by mouth 2 (two) times daily. 90 tablet 2  . nitroGLYCERIN (NITROSTAT) 0.4 MG SL tablet Place 1 tablet (0.4 mg total) under the tongue every 5 (five) minutes as needed for chest pain. 25 tablet 3  . pantoprazole (PROTONIX) 40 MG tablet TAKE 1 TABLET BY MOUTH TWICE DAILY 60 tablet 2  . RANEXA 500 MG 12 hr tablet TAKE 1 TABLET BY MOUTH TWICE DAILY 90 tablet 3  . repaglinide (PRANDIN) 2 MG tablet Take 1 tablet (2 mg total) by mouth 3 (three) times daily before meals. 90 tablet 11  . sertraline (ZOLOFT) 100 MG tablet TAKE 1 TABLET(100 MG) BY MOUTH DAILY 90 tablet 2   No current facility-administered medications on file prior to visit.     Allergies  Allergen Reactions  . Iohexol Anaphylaxis, Itching and Rash     Code: RASH, Desc: [place] ON PT'S CHART ALLERGIC TO IV DYE 09/04/07/RM, Onset Date: 09233007     Family History  Problem Relation Age of Onset  . Cancer - Other Mother        "Stomach" Cancer  . Diabetes Mother   . Heart disease Mother   . Stomach cancer Mother   . Hypertension Mother   . Rheum arthritis Mother   . Lymphoma Father   . Hypertension Father   . Rheum arthritis Father   . Kidney disease Paternal Grandmother   . Stroke Paternal Grandmother   . Asthma Other   . Diabetes Sister   . Heart attack Neg Hx     BP 118/60   Pulse 66   Wt 136 lb (61.7 kg)   SpO2 94%   BMI 21.95 kg/m    Review of Systems Denies LOC, but she has nausea with the pain.     Objective:   Physical Exam VITAL SIGNS:  See vs page GENERAL: no distress LUNGS:  Clear to auscultation HEART:  Regular rate and rhythm  without murmurs noted. Normal S1,S2.    I personally reviewed electrocardiogram tracing (today): Indication: chest pain Impression: [place] old lat MI.  Poss LVH.  Compared to 2017: no significant change.      Assessment & Plan:  Chest pain, recurrent.   Type 2 DM, with renal failure: worse.   Patient Instructions  I have sent a prescription to your pharmacy, to increase the isosorbide to 90 mg daily. Please also take the as-needed nitros, when you have chest pain Please see Dr Meda Coffee next week as scheduled.

## 2017-03-27 DIAGNOSIS — N2581 Secondary hyperparathyroidism of renal origin: Secondary | ICD-10-CM | POA: Diagnosis not present

## 2017-03-27 DIAGNOSIS — D689 Coagulation defect, unspecified: Secondary | ICD-10-CM | POA: Diagnosis not present

## 2017-03-27 DIAGNOSIS — N186 End stage renal disease: Secondary | ICD-10-CM | POA: Diagnosis not present

## 2017-03-27 DIAGNOSIS — R509 Fever, unspecified: Secondary | ICD-10-CM | POA: Diagnosis not present

## 2017-03-27 DIAGNOSIS — R52 Pain, unspecified: Secondary | ICD-10-CM | POA: Diagnosis not present

## 2017-03-27 LAB — FRUCTOSAMINE: Fructosamine: 519 umol/L — ABNORMAL HIGH (ref 190–270)

## 2017-03-28 DIAGNOSIS — N186 End stage renal disease: Secondary | ICD-10-CM | POA: Diagnosis not present

## 2017-03-28 DIAGNOSIS — T82858A Stenosis of vascular prosthetic devices, implants and grafts, initial encounter: Secondary | ICD-10-CM | POA: Diagnosis not present

## 2017-03-28 DIAGNOSIS — Z992 Dependence on renal dialysis: Secondary | ICD-10-CM | POA: Diagnosis not present

## 2017-03-28 DIAGNOSIS — I871 Compression of vein: Secondary | ICD-10-CM | POA: Diagnosis not present

## 2017-03-29 ENCOUNTER — Inpatient Hospital Stay (HOSPITAL_COMMUNITY)
Admission: EM | Admit: 2017-03-29 | Discharge: 2017-04-17 | DRG: 637 | Disposition: E | Payer: Medicare HMO | Attending: Internal Medicine | Admitting: Internal Medicine

## 2017-03-29 ENCOUNTER — Encounter (HOSPITAL_COMMUNITY): Payer: Self-pay | Admitting: *Deleted

## 2017-03-29 ENCOUNTER — Other Ambulatory Visit: Payer: Self-pay | Admitting: Endocrinology

## 2017-03-29 ENCOUNTER — Emergency Department (HOSPITAL_COMMUNITY): Payer: Medicare HMO

## 2017-03-29 DIAGNOSIS — A419 Sepsis, unspecified organism: Secondary | ICD-10-CM | POA: Diagnosis present

## 2017-03-29 DIAGNOSIS — Z789 Other specified health status: Secondary | ICD-10-CM | POA: Diagnosis not present

## 2017-03-29 DIAGNOSIS — Z823 Family history of stroke: Secondary | ICD-10-CM | POA: Diagnosis not present

## 2017-03-29 DIAGNOSIS — Z8249 Family history of ischemic heart disease and other diseases of the circulatory system: Secondary | ICD-10-CM

## 2017-03-29 DIAGNOSIS — K5730 Diverticulosis of large intestine without perforation or abscess without bleeding: Secondary | ICD-10-CM

## 2017-03-29 DIAGNOSIS — R739 Hyperglycemia, unspecified: Secondary | ICD-10-CM | POA: Diagnosis not present

## 2017-03-29 DIAGNOSIS — I10 Essential (primary) hypertension: Secondary | ICD-10-CM | POA: Diagnosis not present

## 2017-03-29 DIAGNOSIS — E1165 Type 2 diabetes mellitus with hyperglycemia: Secondary | ICD-10-CM

## 2017-03-29 DIAGNOSIS — D899 Disorder involving the immune mechanism, unspecified: Secondary | ICD-10-CM | POA: Diagnosis present

## 2017-03-29 DIAGNOSIS — E1110 Type 2 diabetes mellitus with ketoacidosis without coma: Principal | ICD-10-CM | POA: Diagnosis present

## 2017-03-29 DIAGNOSIS — N186 End stage renal disease: Secondary | ICD-10-CM | POA: Diagnosis present

## 2017-03-29 DIAGNOSIS — E1142 Type 2 diabetes mellitus with diabetic polyneuropathy: Secondary | ICD-10-CM | POA: Diagnosis present

## 2017-03-29 DIAGNOSIS — Z89512 Acquired absence of left leg below knee: Secondary | ICD-10-CM

## 2017-03-29 DIAGNOSIS — R740 Nonspecific elevation of levels of transaminase and lactic acid dehydrogenase [LDH]: Secondary | ICD-10-CM

## 2017-03-29 DIAGNOSIS — Z7982 Long term (current) use of aspirin: Secondary | ICD-10-CM

## 2017-03-29 DIAGNOSIS — N19 Unspecified kidney failure: Secondary | ICD-10-CM

## 2017-03-29 DIAGNOSIS — Z9049 Acquired absence of other specified parts of digestive tract: Secondary | ICD-10-CM

## 2017-03-29 DIAGNOSIS — Z8542 Personal history of malignant neoplasm of other parts of uterus: Secondary | ICD-10-CM

## 2017-03-29 DIAGNOSIS — I429 Cardiomyopathy, unspecified: Secondary | ICD-10-CM | POA: Diagnosis present

## 2017-03-29 DIAGNOSIS — R7989 Other specified abnormal findings of blood chemistry: Secondary | ICD-10-CM

## 2017-03-29 DIAGNOSIS — G931 Anoxic brain damage, not elsewhere classified: Secondary | ICD-10-CM | POA: Diagnosis not present

## 2017-03-29 DIAGNOSIS — I481 Persistent atrial fibrillation: Secondary | ICD-10-CM | POA: Diagnosis not present

## 2017-03-29 DIAGNOSIS — R197 Diarrhea, unspecified: Secondary | ICD-10-CM

## 2017-03-29 DIAGNOSIS — I4892 Unspecified atrial flutter: Secondary | ICD-10-CM | POA: Diagnosis present

## 2017-03-29 DIAGNOSIS — I469 Cardiac arrest, cause unspecified: Secondary | ICD-10-CM

## 2017-03-29 DIAGNOSIS — Z794 Long term (current) use of insulin: Secondary | ICD-10-CM | POA: Diagnosis not present

## 2017-03-29 DIAGNOSIS — Z7951 Long term (current) use of inhaled steroids: Secondary | ICD-10-CM

## 2017-03-29 DIAGNOSIS — I2510 Atherosclerotic heart disease of native coronary artery without angina pectoris: Secondary | ICD-10-CM | POA: Diagnosis not present

## 2017-03-29 DIAGNOSIS — N179 Acute kidney failure, unspecified: Secondary | ICD-10-CM | POA: Diagnosis present

## 2017-03-29 DIAGNOSIS — L8990 Pressure ulcer of unspecified site, unspecified stage: Secondary | ICD-10-CM | POA: Insufficient documentation

## 2017-03-29 DIAGNOSIS — Z7902 Long term (current) use of antithrombotics/antiplatelets: Secondary | ICD-10-CM | POA: Diagnosis not present

## 2017-03-29 DIAGNOSIS — R778 Other specified abnormalities of plasma proteins: Secondary | ICD-10-CM

## 2017-03-29 DIAGNOSIS — K219 Gastro-esophageal reflux disease without esophagitis: Secondary | ICD-10-CM | POA: Diagnosis present

## 2017-03-29 DIAGNOSIS — R40242 Glasgow coma scale score 9-12, unspecified time: Secondary | ICD-10-CM | POA: Diagnosis present

## 2017-03-29 DIAGNOSIS — Z951 Presence of aortocoronary bypass graft: Secondary | ICD-10-CM

## 2017-03-29 DIAGNOSIS — E785 Hyperlipidemia, unspecified: Secondary | ICD-10-CM | POA: Diagnosis present

## 2017-03-29 DIAGNOSIS — N185 Chronic kidney disease, stage 5: Secondary | ICD-10-CM | POA: Diagnosis present

## 2017-03-29 DIAGNOSIS — E872 Acidosis: Secondary | ICD-10-CM | POA: Diagnosis present

## 2017-03-29 DIAGNOSIS — D696 Thrombocytopenia, unspecified: Secondary | ICD-10-CM

## 2017-03-29 DIAGNOSIS — Z807 Family history of other malignant neoplasms of lymphoid, hematopoietic and related tissues: Secondary | ICD-10-CM | POA: Diagnosis not present

## 2017-03-29 DIAGNOSIS — R109 Unspecified abdominal pain: Secondary | ICD-10-CM

## 2017-03-29 DIAGNOSIS — R9431 Abnormal electrocardiogram [ECG] [EKG]: Secondary | ICD-10-CM | POA: Diagnosis not present

## 2017-03-29 DIAGNOSIS — K7200 Acute and subacute hepatic failure without coma: Secondary | ICD-10-CM | POA: Diagnosis present

## 2017-03-29 DIAGNOSIS — E1151 Type 2 diabetes mellitus with diabetic peripheral angiopathy without gangrene: Secondary | ICD-10-CM | POA: Diagnosis present

## 2017-03-29 DIAGNOSIS — E874 Mixed disorder of acid-base balance: Secondary | ICD-10-CM | POA: Diagnosis present

## 2017-03-29 DIAGNOSIS — Z66 Do not resuscitate: Secondary | ICD-10-CM | POA: Diagnosis present

## 2017-03-29 DIAGNOSIS — R1111 Vomiting without nausea: Secondary | ICD-10-CM | POA: Diagnosis not present

## 2017-03-29 DIAGNOSIS — R1030 Lower abdominal pain, unspecified: Secondary | ICD-10-CM

## 2017-03-29 DIAGNOSIS — J45909 Unspecified asthma, uncomplicated: Secondary | ICD-10-CM | POA: Diagnosis present

## 2017-03-29 DIAGNOSIS — Z91041 Radiographic dye allergy status: Secondary | ICD-10-CM

## 2017-03-29 DIAGNOSIS — I132 Hypertensive heart and chronic kidney disease with heart failure and with stage 5 chronic kidney disease, or end stage renal disease: Secondary | ICD-10-CM | POA: Diagnosis not present

## 2017-03-29 DIAGNOSIS — G4733 Obstructive sleep apnea (adult) (pediatric): Secondary | ICD-10-CM | POA: Diagnosis present

## 2017-03-29 DIAGNOSIS — J449 Chronic obstructive pulmonary disease, unspecified: Secondary | ICD-10-CM | POA: Diagnosis present

## 2017-03-29 DIAGNOSIS — Z833 Family history of diabetes mellitus: Secondary | ICD-10-CM

## 2017-03-29 DIAGNOSIS — Z515 Encounter for palliative care: Secondary | ICD-10-CM | POA: Diagnosis present

## 2017-03-29 DIAGNOSIS — Z978 Presence of other specified devices: Secondary | ICD-10-CM

## 2017-03-29 DIAGNOSIS — Z992 Dependence on renal dialysis: Secondary | ICD-10-CM | POA: Diagnosis not present

## 2017-03-29 DIAGNOSIS — L89152 Pressure ulcer of sacral region, stage 2: Secondary | ICD-10-CM | POA: Diagnosis present

## 2017-03-29 DIAGNOSIS — I5042 Chronic combined systolic (congestive) and diastolic (congestive) heart failure: Secondary | ICD-10-CM | POA: Diagnosis not present

## 2017-03-29 DIAGNOSIS — R7401 Elevation of levels of liver transaminase levels: Secondary | ICD-10-CM

## 2017-03-29 DIAGNOSIS — Z7952 Long term (current) use of systemic steroids: Secondary | ICD-10-CM

## 2017-03-29 DIAGNOSIS — E11319 Type 2 diabetes mellitus with unspecified diabetic retinopathy without macular edema: Secondary | ICD-10-CM | POA: Diagnosis present

## 2017-03-29 DIAGNOSIS — E1122 Type 2 diabetes mellitus with diabetic chronic kidney disease: Secondary | ICD-10-CM | POA: Diagnosis present

## 2017-03-29 DIAGNOSIS — J9690 Respiratory failure, unspecified, unspecified whether with hypoxia or hypercapnia: Secondary | ICD-10-CM

## 2017-03-29 DIAGNOSIS — I25119 Atherosclerotic heart disease of native coronary artery with unspecified angina pectoris: Secondary | ICD-10-CM | POA: Diagnosis not present

## 2017-03-29 DIAGNOSIS — R000 Tachycardia, unspecified: Secondary | ICD-10-CM | POA: Diagnosis not present

## 2017-03-29 DIAGNOSIS — R0602 Shortness of breath: Secondary | ICD-10-CM

## 2017-03-29 DIAGNOSIS — R748 Abnormal levels of other serum enzymes: Secondary | ICD-10-CM | POA: Diagnosis not present

## 2017-03-29 DIAGNOSIS — I959 Hypotension, unspecified: Secondary | ICD-10-CM | POA: Diagnosis present

## 2017-03-29 DIAGNOSIS — Z781 Physical restraint status: Secondary | ICD-10-CM

## 2017-03-29 DIAGNOSIS — I4891 Unspecified atrial fibrillation: Secondary | ICD-10-CM | POA: Diagnosis present

## 2017-03-29 DIAGNOSIS — E860 Dehydration: Secondary | ICD-10-CM | POA: Diagnosis present

## 2017-03-29 DIAGNOSIS — Z79899 Other long term (current) drug therapy: Secondary | ICD-10-CM

## 2017-03-29 DIAGNOSIS — R112 Nausea with vomiting, unspecified: Secondary | ICD-10-CM

## 2017-03-29 DIAGNOSIS — E875 Hyperkalemia: Secondary | ICD-10-CM | POA: Diagnosis present

## 2017-03-29 DIAGNOSIS — F0390 Unspecified dementia without behavioral disturbance: Secondary | ICD-10-CM | POA: Diagnosis present

## 2017-03-29 DIAGNOSIS — D729 Disorder of white blood cells, unspecified: Secondary | ICD-10-CM

## 2017-03-29 LAB — COMPREHENSIVE METABOLIC PANEL
ALT: 83 U/L — AB (ref 14–54)
AST: 113 U/L — AB (ref 15–41)
Albumin: 3.9 g/dL (ref 3.5–5.0)
Alkaline Phosphatase: 121 U/L (ref 38–126)
Anion gap: 19 — ABNORMAL HIGH (ref 5–15)
BILIRUBIN TOTAL: 1.2 mg/dL (ref 0.3–1.2)
BUN: 39 mg/dL — AB (ref 6–20)
CO2: 20 mmol/L — ABNORMAL LOW (ref 22–32)
CREATININE: 5.48 mg/dL — AB (ref 0.44–1.00)
Calcium: 8.5 mg/dL — ABNORMAL LOW (ref 8.9–10.3)
Chloride: 89 mmol/L — ABNORMAL LOW (ref 101–111)
GFR, EST AFRICAN AMERICAN: 7 mL/min — AB (ref >60)
GFR, EST NON AFRICAN AMERICAN: 6 mL/min — AB (ref >60)
Glucose, Bld: 407 mg/dL — ABNORMAL HIGH (ref 65–99)
Potassium: 4.4 mmol/L (ref 3.5–5.1)
Sodium: 128 mmol/L — ABNORMAL LOW (ref 135–145)
TOTAL PROTEIN: 6.7 g/dL (ref 6.5–8.1)

## 2017-03-29 LAB — BASIC METABOLIC PANEL
ANION GAP: 17 — AB (ref 5–15)
Anion gap: 16 — ABNORMAL HIGH (ref 5–15)
BUN: 43 mg/dL — ABNORMAL HIGH (ref 6–20)
BUN: 44 mg/dL — ABNORMAL HIGH (ref 6–20)
CHLORIDE: 98 mmol/L — AB (ref 101–111)
CHLORIDE: 97 mmol/L — AB (ref 101–111)
CO2: 21 mmol/L — AB (ref 22–32)
CO2: 19 mmol/L — AB (ref 22–32)
CREATININE: 5.33 mg/dL — AB (ref 0.44–1.00)
CREATININE: 5.44 mg/dL — AB (ref 0.44–1.00)
Calcium: 8 mg/dL — ABNORMAL LOW (ref 8.9–10.3)
Calcium: 8 mg/dL — ABNORMAL LOW (ref 8.9–10.3)
GFR calc non Af Amer: 7 mL/min — ABNORMAL LOW (ref >60)
GFR calc non Af Amer: 6 mL/min — ABNORMAL LOW (ref >60)
GFR, EST AFRICAN AMERICAN: 8 mL/min — AB (ref >60)
GFR, EST AFRICAN AMERICAN: 7 mL/min — AB (ref >60)
Glucose, Bld: 181 mg/dL — ABNORMAL HIGH (ref 65–99)
Glucose, Bld: 174 mg/dL — ABNORMAL HIGH (ref 65–99)
Potassium: 3.6 mmol/L (ref 3.5–5.1)
Potassium: 4.2 mmol/L (ref 3.5–5.1)
Sodium: 135 mmol/L (ref 135–145)
Sodium: 133 mmol/L — ABNORMAL LOW (ref 135–145)

## 2017-03-29 LAB — CBC WITH DIFFERENTIAL/PLATELET
BASOS ABS: 0 10*3/uL (ref 0.0–0.1)
BASOS PCT: 0 %
EOS ABS: 0 10*3/uL (ref 0.0–0.7)
EOS PCT: 0 %
HCT: 37.7 % (ref 36.0–46.0)
Hemoglobin: 12.4 g/dL (ref 12.0–15.0)
Lymphocytes Relative: 12 %
Lymphs Abs: 1.6 10*3/uL (ref 0.7–4.0)
MCH: 29.2 pg (ref 26.0–34.0)
MCHC: 32.9 g/dL (ref 30.0–36.0)
MCV: 88.9 fL (ref 78.0–100.0)
Monocytes Absolute: 0.9 10*3/uL (ref 0.1–1.0)
Monocytes Relative: 7 %
Neutro Abs: 11 10*3/uL — ABNORMAL HIGH (ref 1.7–7.7)
Neutrophils Relative %: 81 %
PLATELETS: 136 10*3/uL — AB (ref 150–400)
RBC: 4.24 MIL/uL (ref 3.87–5.11)
RDW: 16.1 % — ABNORMAL HIGH (ref 11.5–15.5)
WBC: 13.5 10*3/uL — ABNORMAL HIGH (ref 4.0–10.5)

## 2017-03-29 LAB — GLUCOSE, CAPILLARY
GLUCOSE-CAPILLARY: 315 mg/dL — AB (ref 65–99)
GLUCOSE-CAPILLARY: 354 mg/dL — AB (ref 65–99)
GLUCOSE-CAPILLARY: 323 mg/dL — AB (ref 65–99)
GLUCOSE-CAPILLARY: 232 mg/dL — AB (ref 65–99)
GLUCOSE-CAPILLARY: 177 mg/dL — AB (ref 65–99)
Glucose-Capillary: 126 mg/dL — ABNORMAL HIGH (ref 65–99)
Glucose-Capillary: 135 mg/dL — ABNORMAL HIGH (ref 65–99)
Glucose-Capillary: 136 mg/dL — ABNORMAL HIGH (ref 65–99)
Glucose-Capillary: 167 mg/dL — ABNORMAL HIGH (ref 65–99)

## 2017-03-29 LAB — LACTIC ACID, PLASMA
Lactic Acid, Venous: 4.8 mmol/L (ref 0.5–1.9)
Lactic Acid, Venous: 4.1 mmol/L (ref 0.5–1.9)

## 2017-03-29 LAB — SEDIMENTATION RATE: SED RATE: 0 mm/h (ref 0–22)

## 2017-03-29 LAB — MAGNESIUM: MAGNESIUM: 2.3 mg/dL (ref 1.7–2.4)

## 2017-03-29 LAB — I-STAT TROPONIN, ED: TROPONIN I, POC: 0.36 ng/mL — AB (ref 0.00–0.08)

## 2017-03-29 LAB — LIPASE, BLOOD: Lipase: 30 U/L (ref 11–51)

## 2017-03-29 LAB — TSH: TSH: 1.058 u[IU]/mL (ref 0.350–4.500)

## 2017-03-29 MED ORDER — HEPARIN SODIUM (PORCINE) 5000 UNIT/ML IJ SOLN
5000.0000 [IU] | Freq: Three times a day (TID) | INTRAMUSCULAR | Status: DC
  Administered 2017-03-29 – 2017-03-30 (×2): 5000 [IU] via SUBCUTANEOUS
  Filled 2017-03-29 (×2): qty 1

## 2017-03-29 MED ORDER — SODIUM CHLORIDE 0.9 % IV SOLN
INTRAVENOUS | Status: DC
  Administered 2017-03-29 – 2017-03-31 (×2): via INTRAVENOUS

## 2017-03-29 MED ORDER — LATANOPROST 0.005 % OP SOLN
1.0000 [drp] | Freq: Every day | OPHTHALMIC | Status: DC
  Administered 2017-03-29 – 2017-03-30 (×2): 1 [drp] via OPHTHALMIC
  Filled 2017-03-29 (×2): qty 2.5

## 2017-03-29 MED ORDER — ACETAMINOPHEN 325 MG PO TABS
650.0000 mg | ORAL_TABLET | Freq: Four times a day (QID) | ORAL | Status: DC | PRN
  Administered 2017-03-29: 650 mg via ORAL
  Filled 2017-03-29: qty 2

## 2017-03-29 MED ORDER — DIPHENHYDRAMINE HCL 50 MG/ML IJ SOLN
6.2500 mg | Freq: Once | INTRAMUSCULAR | Status: AC
  Administered 2017-03-29: 6.5 mg via INTRAVENOUS
  Filled 2017-03-29: qty 1

## 2017-03-29 MED ORDER — ONDANSETRON HCL 4 MG/2ML IJ SOLN
4.0000 mg | Freq: Once | INTRAMUSCULAR | Status: AC
  Administered 2017-03-29: 4 mg via INTRAVENOUS
  Filled 2017-03-29: qty 2

## 2017-03-29 MED ORDER — SODIUM CHLORIDE 0.9 % IV BOLUS (SEPSIS): 500.0000 mL | Freq: Once | INTRAVENOUS | Status: DC

## 2017-03-29 MED ORDER — PREDNISONE 20 MG PO TABS
20.0000 mg | ORAL_TABLET | Freq: Every day | ORAL | Status: DC
  Administered 2017-03-29: 20 mg via ORAL
  Filled 2017-03-29: qty 1

## 2017-03-29 MED ORDER — DILTIAZEM LOAD VIA INFUSION
5.0000 mg | Freq: Once | INTRAVENOUS | Status: AC
  Administered 2017-03-29: 5 mg via INTRAVENOUS
  Filled 2017-03-29: qty 5

## 2017-03-29 MED ORDER — DEXTROSE-NACL 5-0.45 % IV SOLN
INTRAVENOUS | Status: DC
  Administered 2017-03-29 – 2017-03-30 (×2): via INTRAVENOUS

## 2017-03-29 MED ORDER — MOMETASONE FURO-FORMOTEROL FUM 100-5 MCG/ACT IN AERO
2.0000 | INHALATION_SPRAY | Freq: Two times a day (BID) | RESPIRATORY_TRACT | Status: DC
  Administered 2017-03-29 – 2017-03-30 (×2): 2 via RESPIRATORY_TRACT
  Filled 2017-03-29: qty 8.8

## 2017-03-29 MED ORDER — SODIUM CHLORIDE 0.9 % IV BOLUS (SEPSIS)
500.0000 mL | Freq: Once | INTRAVENOUS | Status: AC
  Administered 2017-03-29: 500 mL via INTRAVENOUS

## 2017-03-29 MED ORDER — DILTIAZEM HCL 100 MG IV SOLR
5.0000 mg/h | INTRAVENOUS | Status: DC
  Administered 2017-03-29: 10 mg/h via INTRAVENOUS
  Administered 2017-03-29 – 2017-03-30 (×2): 5 mg/h via INTRAVENOUS
  Administered 2017-03-30: 10 mg/h via INTRAVENOUS
  Filled 2017-03-29 (×3): qty 100

## 2017-03-29 MED ORDER — CLOPIDOGREL BISULFATE 75 MG PO TABS
75.0000 mg | ORAL_TABLET | Freq: Every day | ORAL | Status: DC
  Administered 2017-03-30: 75 mg via ORAL
  Filled 2017-03-29: qty 1

## 2017-03-29 MED ORDER — MORPHINE SULFATE (PF) 4 MG/ML IV SOLN
2.0000 mg | Freq: Once | INTRAVENOUS | Status: AC
  Administered 2017-03-29: 2 mg via INTRAVENOUS
  Filled 2017-03-29: qty 1

## 2017-03-29 MED ORDER — SODIUM CHLORIDE 0.9 % IV SOLN
INTRAVENOUS | Status: DC
  Administered 2017-03-29: 5.3 [IU]/h via INTRAVENOUS
  Administered 2017-03-29: 3.4 [IU]/h via INTRAVENOUS
  Administered 2017-03-29: 2.6 [IU]/h via INTRAVENOUS
  Administered 2017-03-29: 2.3 [IU]/h via INTRAVENOUS
  Administered 2017-03-30: 1.3 [IU]/h via INTRAVENOUS
  Administered 2017-03-30: 0.5 [IU]/h via INTRAVENOUS
  Filled 2017-03-29: qty 1

## 2017-03-29 MED ORDER — SODIUM CHLORIDE 0.9 % IV SOLN
INTRAVENOUS | Status: AC
  Administered 2017-03-29: 17:00:00 via INTRAVENOUS

## 2017-03-29 MED ORDER — INSULIN NPH ISOPHANE & REGULAR (70-30) 100 UNIT/ML ~~LOC~~ SUSP: 5.0000 [IU] | Freq: Every day | SUBCUTANEOUS | 11 refills | Status: AC

## 2017-03-29 MED ORDER — DIPHENHYDRAMINE HCL 25 MG PO CAPS
25.0000 mg | ORAL_CAPSULE | Freq: Three times a day (TID) | ORAL | Status: DC | PRN
  Administered 2017-03-29: 25 mg via ORAL
  Filled 2017-03-29: qty 1

## 2017-03-29 MED ORDER — ALBUTEROL SULFATE (2.5 MG/3ML) 0.083% IN NEBU: 3.0000 mL | INHALATION_SOLUTION | RESPIRATORY_TRACT | Status: DC | PRN

## 2017-03-29 NOTE — ED Notes (Signed)
Attempted report x1. 

## 2017-03-29 NOTE — ED Notes (Addendum)
Pt drinking contrast at this time.

## 2017-03-29 NOTE — Consult Note (Signed)
Renal Service Consult Note Nathalie 03/22/2017 [place] D Requesting Physician:  Dr Denton Brick  Reason for Consult:  ESRD pt with new afib/ RVR HPI: The patient is a 83 y.o. year-old with hx of CAD/ CABG, CHF, COPD, depression, DM, HTN, obesity, PUD, PVD/ L BKA presented to ED with several day hx of abd pain.  No fevers. In ED CT abd/ pelvis was negative, CXR / UA pending.  Pt was felt to be in DKA and was hypotensive, admitted and started on IV insulin and IVF"s.  Was started on IV dilt for afib/ RVR.  We are asked to see for ESRD.    Pt says she has been suffering from significant diarrhea, nausea, vomiting and chills for the past 4 days.  Some abd cramping.  Feeling a little better now.  No SOB or CP.   Pt on HD 3 yrs, esrd due to HTN/ DM.  She lives with her husband, has a daughter lives across the street.       ROS  denies CP  no joint pain   no HA  no blurry vision  no rash  no dysuria  no difficulty voiding  no change in urine color    Past Medical History  Past Medical History:  Diagnosis Date  . Arthritis   . Asthma    Mild  . Bradycardia    a. Not on BB due to this.  Marland Kitchen CAD (coronary artery disease)    a. s/p multiple caths with nonobs CAD;   b. cath 1/10: pLAD 20%, mLAD 40%, pCFX 20%, mCFX 40%, pRCA 60-70%;   c.  Myoview 06/02/12: Low anterior wall scar, no ischemia, EF 37%. d. cath 06/20/2014 70% mid LCx, medical therapy, high risk for PCI due to close proximity to large OM e. cath 03/07/15 pro RCA 50% & pro to mid Cx 75%, both stable, LVEDP 33-54mm Hg-> medical management  . Cardiomyopathy ([place])    a. Echo 05/31/12: EF 40-45%. b. Echo 06/2014: EF 50-55%. c. Cath 02/2015: EF 45-50%.  . Chronic combined systolic and diastolic CHF (congestive heart failure) (HCC)    a. EF 50-55%, mild LVH and grade 1 diast. Dysfxn b. Grade 3 Diastolic Dysfunction 77/4128. b. 02/2015: EF 45-50% by cath.  . CKD (chronic kidney disease), stage III  [facility])    Dr. Lorrene Reid,    dialyzes Tuesday, Thursday, Saturday  . COPD (chronic obstructive pulmonary disease) ([place])   . Depression   . Diabetes mellitus    type II; peripheral neuropathy  . Diverticulosis   . DM retinopathy ([place])   . Dyslipidemia   . GERD (gastroesophageal reflux disease)   . Hiatal hernia   . History of shingles   . Hypertension   . Noncompliance   . Obesity   . Osteoarthritis cervical spine   . Peptic stricture of esophagus   . Peptic ulcer disease    duodenal  . Peripheral vascular disease ([place])    a. s/p L BKA 08/2012.  Marland Kitchen Pruritic condition    Idiopathic  . Shortness of breath dyspnea   . Sleep apnea    with CPAP  . Uterine cancer ([place])   . Zoster    Past Surgical History  Past Surgical History:  Procedure Laterality Date  . ABDOMINAL ANGIOGRAM N/A 08/31/2012   Procedure: ABDOMINAL ANGIOGRAM with runoff poss intervention;  Surgeon: Angelia Mould, MD;  Location: [facility] CATH LAB;  Service: Cardiovascular;  Laterality: N/A;  . AMPUTATION Left  09/04/2012   Procedure: AMPUTATION BELOW KNEE;  Surgeon: Angelia Mould, MD;  Location: [place];  Service: Vascular;  Laterality: Left;  . AV FISTULA PLACEMENT Left 05/10/2015   Procedure: CREATION OF LEFT UPPER ARM ARTERIOVENOUS (AV) FISTULA ;  Surgeon: Elam Dutch, MD;  Location: [place];  Service: Vascular;  Laterality: Left;  . BRAIN SURGERY     1997 blood clot on the brain, then had to relieve fluid on the brain  . CARDIAC CATHETERIZATION    . CARDIAC CATHETERIZATION N/A 03/07/2015   Procedure: Left Heart Cath and Coronary Angiography;  Surgeon: Leonie Man, MD;  Location: [place] CV LAB;  Service: Cardiovascular;  Laterality: N/A;  . CATARACT EXTRACTION, BILATERAL  2005  . CHOLECYSTECTOMY  2010  . CRANIOTOMY  1997   Left for SDH  . DEXA  7/05  . ESOPHAGOGASTRODUODENOSCOPY  04/04/2004  . EYE SURGERY    . HERNIA REPAIR    . KNEE ARTHROSCOPY  10/1998   Left  . LEFT HEART CATHETERIZATION  WITH CORONARY ANGIOGRAM N/A 06/22/2014   Procedure: LEFT HEART CATHETERIZATION WITH CORONARY ANGIOGRAM;  Surgeon: Burnell Blanks, MD;  Location: [facility] CATH LAB;  Service: Cardiovascular;  Laterality: N/A;  . MULTIPLE EXTRACTIONS WITH ALVEOLOPLASTY N/A 08/01/2014   Procedure: EXTRACTIONS OF TEETH NUMBERS 7 8 9 10 11  AND 19 AND ALVEOLOPLASTY UPPER LEFT AND RIGHT QUADRANT;  Surgeon: Isac Caddy, DDS;  Location: [place];  Service: Oral Surgery;  Laterality: N/A;  . SPINE SURGERY     C-spine and lumbar surgery  . TUBAL LIGATION  1967  . TUBAL LIGATION     Family History  Family History  Problem Relation Age of Onset  . Cancer - Other Mother        "Stomach" Cancer  . Diabetes Mother   . Heart disease Mother   . Stomach cancer Mother   . Hypertension Mother   . Rheum arthritis Mother   . Lymphoma Father   . Hypertension Father   . Rheum arthritis Father   . Kidney disease Paternal Grandmother   . Stroke Paternal Grandmother   . Asthma Other   . Diabetes Sister   . Heart attack Neg Hx    Social History  reports that she has never smoked. She has never used smokeless tobacco. She reports that she does not drink alcohol or use drugs. Allergies  Allergies  Allergen Reactions  . Iohexol Anaphylaxis, Itching and Rash     Code: RASH, Desc: [place] ON PT'S CHART ALLERGIC TO IV DYE 09/04/07/RM, Onset Date: 16109604    Home medications Prior to Admission medications   Medication Sig Start Date End Date Taking? Authorizing Provider  acetaminophen (TYLENOL) 500 MG tablet Take 500 mg by mouth every 6 (six) hours as needed for mild pain.   Yes [provider]  albuterol (PROVENTIL HFA;VENTOLIN HFA) 108 (90 BASE) MCG/ACT inhaler Inhale 2 puffs into the lungs every 4 (four) hours as needed for wheezing or shortness of breath. 04/19/15  Yes Renato Shin, MD  allopurinol (ZYLOPRIM) 100 MG tablet TAKE 1 TABLET(100 MG) BY MOUTH DAILY 11/27/16  Yes Renato Shin, MD  aspirin EC  81 MG tablet Take 1 tablet (81 mg total) by mouth daily. 03/16/15  Yes Dunn, Dayna N, PA-C  atorvastatin (LIPITOR) 40 MG tablet TAKE 1 TABLET BY MOUTH DAILY AT 6 PM Patient taking differently: Take 40 mg by mouth daily at 6 PM. TAKE 1 TABLET BY MOUTH DAILY AT 6 PM  11/27/16  Yes Renato Shin, MD  clopidogrel (PLAVIX) 75 MG tablet Take 1 tablet (75 mg total) by mouth daily. 10/04/16  Yes Dorothy Spark, MD  colestipol (COLESTID) 1 g tablet Take 3 tablets (3 g total) by mouth daily. 02/26/17  Yes Renato Shin, MD  donepezil (ARICEPT) 5 MG tablet TAKE 1 TABLET(5 MG) BY MOUTH AT BEDTIME 11/27/16  Yes Renato Shin, MD  ezetimibe (ZETIA) 10 MG tablet Take 1 tablet (10 mg total) by mouth every morning. 12/20/16  Yes Renato Shin, MD  feeding supplement (GLUCERNA SHAKE) LIQD Take 237 mLs by mouth every morning.    Yes [provider]  Fluticasone-Salmeterol (ADVAIR DISKUS) 100-50 MCG/DOSE AEPB Inhale 1 puff into the lungs 2 (two) times daily. 04/19/15  Yes Renato Shin, MD  gabapentin (NEURONTIN) 100 MG capsule Take 1 capsule (100 mg total) by mouth at bedtime. 12/20/16  Yes Renato Shin, MD  hydrALAZINE (APRESOLINE) 25 MG tablet TAKE 1 TABLET(25 MG) BY MOUTH THREE TIMES DAILY Patient taking differently: Take 25 mg by mouth 3 (three) times daily. TAKE 1 TABLET(25 MG) BY MOUTH THREE TIMES DAILY 12/20/16  Yes Renato Shin, MD  isosorbide mononitrate (IMDUR) 60 MG 24 hr tablet Take 90 mg by mouth daily. 12/20/16  Yes [provider]  latanoprost (XALATAN) 0.005 % ophthalmic solution Place 1 drop into both eyes at bedtime.   Yes [provider]  memantine (NAMENDA) 5 MG tablet Take 1 tablet (5 mg total) by mouth 2 (two) times daily. 12/20/16  Yes Renato Shin, MD  nitroGLYCERIN (NITROSTAT) 0.4 MG SL tablet Place 1 tablet (0.4 mg total) under the tongue every 5 (five) minutes as needed for chest pain. 03/04/17  Yes Dorothy Spark, MD  pantoprazole (PROTONIX) 40 MG tablet TAKE 1 TABLET BY  MOUTH TWICE DAILY Patient taking differently: TAKE 40 mg  TABLET BY MOUTH TWICE DAILY 01/14/17  Yes Renato Shin, MD  predniSONE (DELTASONE) 20 MG tablet Take 20 mg by mouth at bedtime. 03/27/17  Yes [provider]  RANEXA 500 MG 12 hr tablet TAKE 1 TABLET BY MOUTH TWICE DAILY Patient taking differently: TAKE 500 mg TABLET BY MOUTH TWICE DAILY 02/27/17  Yes Dorothy Spark, MD  repaglinide (PRANDIN) 2 MG tablet Take 1 tablet (2 mg total) by mouth 3 (three) times daily before meals. 02/19/17  Yes Renato Shin, MD  sertraline (ZOLOFT) 100 MG tablet TAKE 1 TABLET(100 MG) BY MOUTH DAILY 12/20/16  Yes Renato Shin, MD  insulin NPH-regular Human (NOVOLIN 70/30) (70-30) 100 UNIT/ML injection Inject 5 Units into the skin daily with breakfast. And syringes 1/day 04/12/2017   Renato Shin, MD  isosorbide mononitrate (IMDUR) 30 MG 24 hr tablet Take 3 tablets (90 mg total) by mouth daily. Patient not taking: Reported on 04/03/2017 03/26/17   Renato Shin, MD   Liver Function Tests  Recent Labs Lab 04/13/2017 1012  AST 113*  ALT 83*  ALKPHOS 121  BILITOT 1.2  PROT 6.7  ALBUMIN 3.9    Recent Labs Lab 04/07/2017 1012  LIPASE 30   CBC  Recent Labs Lab 04/13/2017 1012  WBC 13.5*  NEUTROABS 11.0*  HGB 12.4  HCT 37.7  MCV 88.9  PLT 245*   Basic Metabolic Panel  Recent Labs Lab 03/30/2017 1012  NA 128*  K 4.4  CL 89*  CO2 20*  GLUCOSE 407*  BUN 39*  CREATININE 5.48*  CALCIUM 8.5*   Iron/TIBC/Ferritin/ %Sat    Component Value Date/Time   IRON 52 05/09/2015 1553  TIBC 319 05/09/2015 1553   IRONPCTSAT 16 05/09/2015 1553    Vitals:   03/28/2017 1427 03/30/2017 1430 03/21/2017 1530 03/25/2017 1615  BP: 107/65 102/70 118/64 112/65  Pulse: (!) 122   (!) 104  Resp: (!) 21 (!) 24 (!) 22 16  Temp:      TempSrc:      SpO2: 93%   100%  Weight:      Height:       Exam Gen alert, elderly AAF, somewhat frail, lips very dry No rash, cyanosis or gangrene Sclera anicteric, throat  very dry  No jvd or bruits Chest clear bilat to bases Cor irreg irreg w/o RG Abd soft ntnd no mass or ascites +bs GU defer MS L BKA intact, no LE or UE edema No wounds or ulcers Neuro is alert, Ox 3 , nf   Home meds: -Advair/ ventolin HFA -asa/ colestipol/ Plavix/ Ranexa/ Zetia/ Imdur/ lipitor/ SL NTG prn -donepezil/ gabapentin/ memantine/ sertraline/ vicodin prn -allopurinol/ MVI/ PPI -hydralazine 25 tid -70/30 insulin 8 u qam -eye drops x 3    Dialysis: [facility]h   66kg   2/2.25   Hep 4000   -hect 3 ug tiw -no ESA ( last mircera 30 ug on 8/16) -last Hb 11.7, P 4.6, alb 4.1, CA 8.9 , pth 465, tsat 31%  Na 128  Glu 407  CO2 20  BUN 39  Cr 5.48  K 4.4  Alb 3.9  WBC 13k  Hb 12  plt 136 CT abd/ pelv > IMPRESSION: No evidence of bowel obstruction.  Sigmoid diverticulosis, without evidence of diverticulitis.  No CT findings to account for the patient's lower abdominal pain.    Impression: 1. ABd pain - assoc with N/V/D and fevers at home. Question viral illness.  2. AFib with RVR - new onset, on IV dilt, HR 100 3. ESRD on HD TTS. Today is her day but will likely hold off with acute afib/ RVR.  4. Volume - looks quite dry on exam. Getting IVF's , total ~ 110 / hr now.  Will reduce slightly.  5. Hypotension - borderline low. Holding hydralazine 6. Dementia / psych/ pain - gets one SSRI, 2 dementia, neurontin and prn vicodin 7. DM/ DKA - per primary team 8. Hx Cad 9. HL      Plan - as above, cont IVF's, will reassess in am.   Kelly Splinter MD [place] pager [phone]   04/04/2017, 4:43 PM

## 2017-03-29 NOTE — ED Notes (Signed)
Pt reports severe itching after drinking first bottle of PO contrast. Dr. Eulis Foster aware and vo to stop drinking contrast at this time

## 2017-03-29 NOTE — Progress Notes (Signed)
Pt CBG is 135. Insulin to administer at .8 ml/h.

## 2017-03-29 NOTE — Progress Notes (Addendum)
Lactic acid elevated at 4.8 so will give additional NS bolus of 500 cc- continue to cycle  Discussed with RN-EDP had previously ordered a total of 2 L of IV fluid which are continuing treatment views and had not initially been listed on medications received in the ER-above 500 mL bolus will be canceled in favor of 2 L are in process.  Erin Hearing, ANP

## 2017-03-29 NOTE — ED Triage Notes (Signed)
Pt reports lower abd pain with n/v/d for several days. Denies fever or urinary symptoms. Is dialysis pt, due for treatment today.

## 2017-03-29 NOTE — Progress Notes (Signed)
Pt arrived to 4e from [facility] ED. Vitals obtained. Telemetry box applied and CCMD notified x2. Pt oriented to room and staff. Pt with insulin and cardizem drips. Denies needs at this time. Will continue current plan of care.   Grant Fontana BSN, RN

## 2017-03-29 NOTE — ED Provider Notes (Signed)
  Face-to-face evaluation   History: The patient presents for evaluation of abdominal pain with nausea and vomiting for several days.  She is due for her dialysis treatment today.  Physical exam: Alert elderly female, who appears comfortable.  She defers questions to her family members, husband and daughter who are with her.  Heart tachycardic without murmur.  Lungs clear anteriorly.  Vascular access device left upper arm, with normal pulse.  Abdomen with mild diffuse tenderness.  11: 55-patient began itching, and kicking her right foot, after drinking oral contrast.  At this time lungs are clear, oxygen saturation is normal, and right foot including skin appears normal.  Patient is occasionally kicking her right foot while I examined her.  There is no oral swelling.  IV Benadryl ordered.  Medical screening examination/treatment/procedure(s) were conducted as a shared visit with non-physician practitioner(s) and myself.  I personally evaluated the patient during the encounter   Daleen Bo, MD 03/30/17 [phone]

## 2017-03-29 NOTE — Progress Notes (Signed)
Pt has two consecutive  Blood sugar reading of 126 and 136. No insulin to administer. We'll continue to monitor and notified MD.

## 2017-03-29 NOTE — ED Provider Notes (Signed)
[facility] DEPT Provider Note   CSN: [id number] Arrival date & time: 03/30/2017  3500     History   Chief Complaint Chief Complaint  Patient presents with  . Abdominal Pain  . Emesis    HPI [name] is a 83 y.o. female with a PMHx of ESRD on dialysis Tue/Thurs/Sat (last dialyzed Thursday 03/27/17), chronic bradycardia, CAD s/p multiple caths, cardiomyopathy, DM2, HLD, HTN, GERD/PUD, diverticulosis, and other conditions listed below, with PSHx of cholecystectomy among other procedures listed below, who presents to the ED with complaints of lower abdominal pain, nausea, and diarrhea 3-4 days. Patient describes her pain as 10/10 constant throbbing diffuse lower abdominal pain that radiates to the left flank area, which worsens with eating and dry heaving, and has been mildly improved with Pepto-Bismol. She states that she is also had nausea with frequent dry heaving however is not actually vomiting, and has not had any hematemesis with it. She is also having about 3 episodes daily of nonbloody watery diarrhea. Additionally she mentions that she has noticed some palpitations for the last 2-3 days and lightheadedness when she stands up. She goes to dialysis on Tuesdays Thursdays and Saturdays, is due for dialysis today as her last dialysis was on Thursday 03/27/17. Her kidney specialist is Dr. Lorrene Reid. Her PCP is Dr. Lissa Merlin at [facility].  She denies fevers, chills, CP, SOB, vomiting, constipation, obstipation, melena, hematochezia, hematemesis, hematuria, dysuria, vaginal bleeding/discharge, myalgias, arthralgias, numbness, tingling, focal weakness, or any other complaints at this time. Denies recent travel, sick contacts, suspicious food intake, EtOH use, or frequent NSAID use.    The history is provided by the patient and medical records. No language interpreter was used.  Abdominal Pain   This is a new problem. The current episode started more than 2 days ago. The problem occurs  constantly. The problem has not changed since onset.The pain is associated with an unknown factor. The pain is located in the suprapubic region, LLQ and RLQ. The quality of the pain is throbbing. The pain is at a severity of 10/10. The pain is moderate. Associated symptoms include diarrhea, nausea and vomiting (dry heaving, no vomiting). Pertinent negatives include fever, flatus, hematochezia, melena, constipation, dysuria, hematuria, arthralgias and myalgias. The symptoms are aggravated by eating. Nothing relieves the symptoms. Her past medical history is significant for PUD and GERD.  Emesis   Associated symptoms include abdominal pain and diarrhea. Pertinent negatives include no arthralgias, no chills, no fever and no myalgias.    Past Medical History:  Diagnosis Date  . Arthritis   . Asthma    Mild  . Bradycardia    a. Not on BB due to this.  Marland Kitchen CAD (coronary artery disease)    a. s/p multiple caths with nonobs CAD;   b. cath 1/10: pLAD 20%, mLAD 40%, pCFX 20%, mCFX 40%, pRCA 60-70%;   c.  Myoview 06/02/12: Low anterior wall scar, no ischemia, EF 37%. d. cath 06/20/2014 70% mid LCx, medical therapy, high risk for PCI due to close proximity to large OM e. cath 03/07/15 pro RCA 50% & pro to mid Cx 75%, both stable, LVEDP 33-91mm Hg-> medical management  . Cardiomyopathy ([place])    a. Echo 05/31/12: EF 40-45%. b. Echo 06/2014: EF 50-55%. c. Cath 02/2015: EF 45-50%.  . Chronic combined systolic and diastolic CHF (congestive heart failure) (HCC)    a. EF 50-55%, mild LVH and grade 1 diast. Dysfxn b. Grade 3 Diastolic Dysfunction 93/8182. b. 02/2015: EF 45-50% by cath.  Marland Kitchen  CKD (chronic kidney disease), stage III [facility])    Dr. Lorrene Reid,    dialyzes Tuesday, Thursday, Saturday  . COPD (chronic obstructive pulmonary disease) ([place])   . Depression   . Diabetes mellitus    type II; peripheral neuropathy  . Diverticulosis   . DM retinopathy ([place])   . Dyslipidemia   . GERD (gastroesophageal reflux disease)   .  Hiatal hernia   . History of shingles   . Hypertension   . Noncompliance   . Obesity   . Osteoarthritis cervical spine   . Peptic stricture of esophagus   . Peptic ulcer disease    duodenal  . Peripheral vascular disease ([place])    a. s/p L BKA 08/2012.  Marland Kitchen Pruritic condition    Idiopathic  . Shortness of breath dyspnea   . Sleep apnea    with CPAP  . Uterine cancer ([place])   . Zoster     Patient Active Problem List   Diagnosis Date Noted  . Asymptomatic menopausal state 02/19/2017  . Shoulder pain 10/18/2016  . OSA (obstructive sleep apnea) 09/16/2016  . Dysuria 07/23/2016  . PAD (peripheral artery disease) ([place]) 05/01/2016  . ESRD needing dialysis ([place]) 02/17/2016  . Coronary artery disease involving native coronary artery of native heart without angina pectoris 10/23/2015  . NSTEMI (non-ST elevated myocardial infarction) ([place]) 10/23/2015  . Hypertensive heart disease with heart failure ([place]) 10/23/2015  . Chest pain 10/17/2015  . COPD (chronic obstructive pulmonary disease) ([place]) 10/17/2015  . Prolonged Q-T interval on ECG 10/17/2015  . Diabetes ([place]) 09/18/2015  . NSVT (nonsustained ventricular tachycardia) ([place]) 04/26/2015  . Chronic diastolic congestive heart failure, NYHA class 3 ([place])   . Elevated troponin 03/06/2015  . Dizziness 10/25/2014  . Caries 08/01/2014  . Sinus bradycardia 06/19/2014  . Contrast media allergy 06/19/2014  . Gout 12/09/2013  . Avulsion injury of left iliopsoas and hematoma 12/06/2013  . Gait instability: Status post left BKA 09/10/2012  . Vitamin D deficiency 02/20/2012  . Encounter for long-term (current) use of other medications 11/21/2011  . HYPOKALEMIA 01/22/2010  . CAROTID BRUIT, LEFT 05/18/2009  . DYSPHAGIA 04/05/2009  . Obstructive sleep apnea 10/12/2008  . CKD (chronic kidney disease) stage V requiring chronic dialysis ([place]) 07/19/2008  . Osteoporosis 06/17/2008  . Hyperlipidemia 07/16/2007  . DEPRESSION 07/16/2007  . Hereditary  and idiopathic peripheral neuropathy 07/16/2007  . ASTHMA 07/16/2007  . COPD 07/16/2007  . PEPTIC ULCER DISEASE 07/16/2007  . Essential hypertension 01/22/2007  . Coronary artery disease due to lipid rich plaque; 70% ostial-proximal AV Groove Cx - not good PCI target 01/22/2007  . PERIPHERAL VASCULAR DISEASE 01/22/2007  . GERD 01/22/2007  . OSTEOARTHRITIS 01/22/2007    Past Surgical History:  Procedure Laterality Date  . ABDOMINAL ANGIOGRAM N/A 08/31/2012   Procedure: ABDOMINAL ANGIOGRAM with runoff poss intervention;  Surgeon: Angelia Mould, MD;  Location[facility] C[facility] LAB;  Service: Cardiovascular;  Laterality: N/A;  . AMPUTATION Left 09/04/2012   Procedure: AMPUTATION BELOW KNEE;  Surgeon: Angelia Mould, MD;  Location: [facility];  Service: Vascular;  Laterality: Left;  . AV FISTULA PLACEMENT Left 05/10/2015   Procedure: CREATION OF LEFT UPPER ARM ARTERIOVENOUS (AV) FISTULA ;  Surgeon: Elam Dutch, MD;  Location: [facility] [facility];  Service: Vascular;  Laterality: Left;  . BRAIN SURGERY     1997 blood clot on the brain, then had to relieve fluid on the brain  . CARDIAC CATHETERIZATION    . CARDIAC CATHETERIZATION N/A 03/07/2015  Procedure: Left Heart Cath and Coronary Angiography;  Surgeon: Leonie Man, MD;  Location: [facility] CV LAB;  Service: Cardiovascular;  Laterality: N/A;  . CATARACT EXTRACTION, BILATERAL  2005  . CHOLECYSTECTOMY  2010  . CRANIOTOMY  1997   Left for SDH  . DEXA  7/05  . ESOPHAGOGASTRODUODENOSCOPY  04/04/2004  . EYE SURGERY    . HERNIA REPAIR    . KNEE ARTHROSCOPY  10/1998   Left  . LEFT HEART CATHETERIZATION WITH CORONARY ANGIOGRAM N/A 06/22/2014   Procedure: LEFT HEART CATHETERIZATION WITH CORONARY ANGIOGRAM;  Surgeon: Burnell Blanks, MD;  Location: [facility] CATH LAB;  Service: Cardiovascular;  Laterality: N/A;  . MULTIPLE EXTRACTIONS WITH ALVEOLOPLASTY N/A 08/01/2014   Procedure: EXTRACTIONS OF TEETH NUMBERS 7 8 9 10 11  AND 19 AND ALVEOLOPLASTY  UPPER LEFT AND RIGHT QUADRANT;  Surgeon: Isac Caddy, DDS;  Location: [facility];  Service: Oral Surgery;  Laterality: N/A;  . SPINE SURGERY     C-spine and lumbar surgery  . TUBAL LIGATION  1967  . TUBAL LIGATION      OB History    No data available       Home Medications    Prior to Admission medications   Medication Sig Start Date End Date Taking? Authorizing Provider  acetaminophen (TYLENOL) 500 MG tablet Take 500 mg by mouth every 6 (six) hours as needed for mild pain.    [provider]  albuterol (PROVENTIL HFA;VENTOLIN HFA) 108 (90 BASE) MCG/ACT inhaler Inhale 2 puffs into the lungs every 4 (four) hours as needed for wheezing or shortness of breath. 04/19/15   Renato Shin, MD  allopurinol (ZYLOPRIM) 100 MG tablet TAKE 1 TABLET(100 MG) BY MOUTH DAILY 11/27/16   Renato Shin, MD  aspirin EC 81 MG tablet Take 1 tablet (81 mg total) by mouth daily. 03/16/15   Dunn, Nedra Hai, PA-C  atorvastatin (LIPITOR) 40 MG tablet TAKE 1 TABLET BY MOUTH DAILY AT 6 PM 11/27/16   Renato Shin, MD  clopidogrel (PLAVIX) 75 MG tablet Take 1 tablet (75 mg total) by mouth daily. 10/04/16   Dorothy Spark, MD  colestipol (COLESTID) 1 g tablet Take 3 tablets (3 g total) by mouth daily. 02/26/17   Renato Shin, MD  donepezil (ARICEPT) 5 MG tablet TAKE 1 TABLET(5 MG) BY MOUTH AT BEDTIME 11/27/16   Renato Shin, MD  ezetimibe (ZETIA) 10 MG tablet Take 1 tablet (10 mg total) by mouth every morning. 12/20/16   Renato Shin, MD  feeding supplement (GLUCERNA SHAKE) LIQD Take 237 mLs by mouth every morning.     [provider]  Fluticasone-Salmeterol (ADVAIR DISKUS) 100-50 MCG/DOSE AEPB Inhale 1 puff into the lungs 2 (two) times daily. 04/19/15   Renato Shin, MD  gabapentin (NEURONTIN) 100 MG capsule Take 1 capsule (100 mg total) by mouth at bedtime. 12/20/16   Renato Shin, MD  glucose blood (ONE TOUCH ULTRA TEST) test strip USE TO TEST BLOOD SUGAR TWICE DAILY 12/24/16   Renato Shin, MD    hydrALAZINE (APRESOLINE) 25 MG tablet TAKE 1 TABLET(25 MG) BY MOUTH THREE TIMES DAILY 12/20/16   Renato Shin, MD  isosorbide mononitrate (IMDUR) 30 MG 24 hr tablet Take 3 tablets (90 mg total) by mouth daily. 03/26/17   Renato Shin, MD  latanoprost (XALATAN) 0.005 % ophthalmic solution Place 1 drop into both eyes at bedtime.    [provider]  memantine (NAMENDA) 5 MG tablet Take 1 tablet (5 mg total) by mouth 2 (two)  times daily. 12/20/16   Renato Shin, MD  nitroGLYCERIN (NITROSTAT) 0.4 MG SL tablet Place 1 tablet (0.4 mg total) under the tongue every 5 (five) minutes as needed for chest pain. 03/04/17   Dorothy Spark, MD  pantoprazole (PROTONIX) 40 MG tablet TAKE 1 TABLET BY MOUTH TWICE DAILY 01/14/17   Renato Shin, MD  RANEXA 500 MG 12 hr tablet TAKE 1 TABLET BY MOUTH TWICE DAILY 02/27/17   Dorothy Spark, MD  repaglinide (PRANDIN) 2 MG tablet Take 1 tablet (2 mg total) by mouth 3 (three) times daily before meals. 02/19/17   Renato Shin, MD  sertraline (ZOLOFT) 100 MG tablet TAKE 1 TABLET(100 MG) BY MOUTH DAILY 12/20/16   Renato Shin, MD    Family History Family History  Problem Relation Age of Onset  . Cancer - Other Mother        "Stomach" Cancer  . Diabetes Mother   . Heart disease Mother   . Stomach cancer Mother   . Hypertension Mother   . Rheum arthritis Mother   . Lymphoma Father   . Hypertension Father   . Rheum arthritis Father   . Kidney disease Paternal Grandmother   . Stroke Paternal Grandmother   . Asthma Other   . Diabetes Sister   . Heart attack Neg Hx     Social History Social History  Substance Use Topics  . Smoking status: Never Smoker  . Smokeless tobacco: Never Used  . Alcohol use No     Comment: rare     Allergies   Iohexol   Review of Systems Review of Systems  Constitutional: Negative for chills and fever.  Respiratory: Negative for shortness of breath.   Cardiovascular: Positive for palpitations. Negative for chest  pain.  Gastrointestinal: Positive for abdominal pain, diarrhea, nausea and vomiting (dry heaving, no vomiting). Negative for blood in stool, constipation, flatus, hematochezia and melena.  Genitourinary: Negative for dysuria, hematuria, vaginal bleeding and vaginal discharge.  Musculoskeletal: Negative for arthralgias and myalgias.  Skin: Negative for color change.  Allergic/Immunologic: Positive for immunocompromised state (DM2).  Neurological: Positive for light-headedness (with standing). Negative for weakness and numbness.  Psychiatric/Behavioral: Negative for confusion.   All other systems reviewed and are negative for acute change except as noted in the HPI.    Physical Exam Updated Vital Signs BP 108/74 (BP Location: Right Arm)   Pulse (!) 43   Temp 98.3 F (36.8 C) (Oral)   Resp 17   Ht 5\' 6"  (1.676 m)   Wt 61.7 kg (136 lb)   SpO2 95%   BMI 21.95 kg/m   Physical Exam  Constitutional: She is oriented to person, place, and time. Vital signs are normal. She appears well-developed and well-nourished.  Non-toxic appearance. No distress.  Afebrile, nontoxic, NAD, elderly frail appearing AAF  HENT:  Head: Normocephalic and atraumatic.  Mouth/Throat: Oropharynx is clear and moist. Mucous membranes are dry.  Lips and mucous membranes dry  Eyes: Conjunctivae and EOM are normal. Right eye exhibits no discharge. Left eye exhibits no discharge.  Neck: Normal range of motion. Neck supple.  Cardiovascular: Normal heart sounds and intact distal pulses.  An irregularly irregular rhythm present. Tachycardia present.  Exam reveals no gallop and no friction rub.   No murmur heard. Afib with RVR, irregularly irregular rhythm with HR 120-140s during exam, nl s1/s2, no m/r/g audible, RLE distal pulses intact, no pedal edema of R leg, LLE s/p BKA with prosthesis in place  Pulmonary/Chest: Effort normal and  breath sounds normal. No respiratory distress. She has no decreased breath sounds. She has  no wheezes. She has no rhonchi. She has no rales.  Abdominal: Soft. Normal appearance and bowel sounds are normal. She exhibits no distension. There is generalized tenderness. There is no rigidity, no rebound, no guarding, no CVA tenderness, no tenderness at McBurney's point and negative Murphy's sign.  Soft, nondistended, +BS throughout, with mild diffuse abd TTP without focal area of tenderness, no r/g/r, neg murphy's, neg mcburney's, no CVA TTP   Musculoskeletal: Normal range of motion.  LLE BKA with prosthesis in place  Neurological: She is alert and oriented to person, place, and time. She has normal strength. No sensory deficit.  Skin: Skin is warm, dry and intact. No rash noted.  Psychiatric: She has a normal mood and affect.  Nursing note and vitals reviewed.    ED Treatments / Results  Labs (all labs ordered are listed, but only abnormal results are displayed) Labs Reviewed  COMPREHENSIVE METABOLIC PANEL - Abnormal; Notable for the following:       Result Value   Sodium 128 (*)    Chloride 89 (*)    CO2 20 (*)    Glucose, Bld 407 (*)    BUN 39 (*)    Creatinine, Ser 5.48 (*)    Calcium 8.5 (*)    AST 113 (*)    ALT 83 (*)    GFR calc non Af Amer 6 (*)    GFR calc Af Amer 7 (*)    Anion gap 19 (*)    All other components within normal limits  CBC WITH DIFFERENTIAL/PLATELET - Abnormal; Notable for the following:    WBC 13.5 (*)    RDW 16.1 (*)    Platelets 136 (*)    Neutro Abs 11.0 (*)    All other components within normal limits  I-STAT TROPONIN, ED - Abnormal; Notable for the following:    Troponin i, poc 0.36 (*)    All other components within normal limits  LIPASE, BLOOD  MAGNESIUM  URINALYSIS, ROUTINE W REFLEX MICROSCOPIC    EKG  EKG Interpretation  Date/Time:  Saturday March 29 2017 10:35:19 EDT Ventricular Rate:  138 PR Interval:    QRS Duration: 129 QT Interval:  352 QTC Calculation: 534 R Axis:   -117 Text Interpretation:  Atrial  flutter/fibrillation RBBB and LAFB Probable lateral infarct, age indeterminate Since last tracing Atrial fibrillation is new, with rapid rate Confirmed by Daleen Bo [phone]) on 03/21/2017 10:59:58 AM       Radiology Ct Abdomen Pelvis Wo Contrast  Result Date: 03/19/2017 CLINICAL DATA:  Lower abdominal pain, nausea/vomiting/diarrhea EXAM: CT ABDOMEN AND PELVIS WITHOUT CONTRAST TECHNIQUE: Multidetector CT imaging of the abdomen and pelvis was performed following the standard protocol without IV contrast. COMPARISON:  11/14/2016 FINDINGS: Lower chest: Lung bases are clear. Cardiomegaly. Coronary atherosclerosis. Mitral valve annular calcifications. Hepatobiliary: Unenhanced liver is unremarkable. Status post cholecystectomy. No intrahepatic extrahepatic ductal dilatation. Pancreas: Within normal limits. Spleen: Within normal limits. Adrenals/Urinary Tract: Adrenal glands are within normal limits. Bilateral renal cortical atrophy. No renal calculi or hydronephrosis. Bladder is thick-walled. Associated hyperdense fluid in the bladder is presumably related to prior contrast administration (prior CT, cardiovascular, or interventional procedure) in a patient who is relatively anuric. Stomach/Bowel: Step stomach is within normal limits. No evidence of bowel obstruction. Appendix is not discretely visualized. Sigmoid diverticulosis, without evidence of diverticulitis. Vascular/Lymphatic: No evidence of abdominal aortic aneurysm. Atherosclerotic calcifications of the abdominal aorta and  branch vessels. No suspicious abdominopelvic lymphadenopathy. Reproductive: Uterus is heterogeneous with small calcified uterine fibroids. No adnexal masses. Other: No abdominopelvic ascites. Musculoskeletal: Mild degenerative changes of the visualized thoracolumbar spine. Healed fracture of the right iliac wing. IMPRESSION: No evidence of bowel obstruction. Sigmoid diverticulosis, without evidence of diverticulitis. No CT findings  to account for the patient's lower abdominal pain. Additional ancillary findings as above. Electronically Signed   By: Julian Hy M.D.   On: 04/06/2017 13:38    Procedures Procedures (including critical care time)  CRITICAL CARE Performed by: Reece Agar   Total critical care time: 35 minutes  Critical care time was exclusive of separately billable procedures and treating other patients.  Critical care was necessary to treat or prevent imminent or life-threatening deterioration.  Critical care was time spent personally by me on the following activities: development of treatment plan with patient and/or surrogate as well as nursing, discussions with consultants, evaluation of patient's response to treatment, examination of patient, obtaining history from patient or surrogate, ordering and performing treatments and interventions, ordering and review of laboratory studies, ordering and review of radiographic studies, pulse oximetry and re-evaluation of patient's condition.   Medications Ordered in ED Medications  diltiazem (CARDIZEM) 1 mg/mL load via infusion 5 mg (5 mg Intravenous Bolus from Bag 04/13/2017 1138)    And  diltiazem (CARDIZEM) 100 mg in dextrose 5 % 100 mL (1 mg/mL) infusion (10 mg/hr Intravenous Rate/Dose Change 03/30/2017 1437)  sodium chloride 0.9 % bolus 500 mL (not administered)  morphine 4 MG/ML injection 2 mg (2 mg Intravenous Given 03/17/2017 1102)  ondansetron (ZOFRAN) injection 4 mg (4 mg Intravenous Given 04/10/2017 1102)  sodium chloride 0.9 % bolus 500 mL (500 mLs Intravenous New Bag/Given 03/28/2017 1138)  diphenhydrAMINE (BENADRYL) injection 6.5 mg (6.5 mg Intravenous Given 03/21/2017 1202)  diphenhydrAMINE (BENADRYL) injection 6.5 mg (6.5 mg Intravenous Given 04/03/2017 1437)     Initial Impression / Assessment and Plan / ED Course  I have reviewed the triage vital signs and the nursing notes.  Pertinent labs & imaging results that were available during my  care of the patient were reviewed by me and considered in my medical decision making (see chart for details).     83 y.o. female here with lower abd pain/nausea/diarrhea x3-4 days. On exam, lips very dry; mild diffuse generalized abd TTP without focal area of tenderness, no flank pain, nonperitoneal; also noted to be in afib with RVR noted with HR 120-140s, pt states she noticed palpitations 2-3 days ago. Will get labs, EKG, and CTA/P with oral contrast only (pt still makes some urine, want to protect remaining kidney function). Will give zofran and morphine, as well as small fluid bolus. For Afib, will give low dose of diltiazem to attempt to rate control her. Discussed case with my attending Dr. Eulis Foster who agrees with plan. Will reassess shortly.   This patients CHA2DS2-VASc Score and unadjusted Ischemic Stroke Rate (% per year) is equal to 9.7 % stroke rate/year from a score of 6  Above score calculated as 1 point each if present [CHF, HTN, DM, Vascular=MI/PAD/Aortic Plaque, Age if 65-74, or Female] Above score calculated as 2 points each if present [Age > 75, or Stroke/TIA/TE]   1:39 PM CBC w/diff with mild neutrophilic leukocytosis and mild thrombocytopenia. CMP with marginally elevated AST/ALT, elevated glucose 407 with pseudohyponatremia, mild hypochloremia, elevated BUN 39, elevated Cr 5.48, mildly low CO2 and elevated anion gap 19 likely from uremia. Lipase WNL. Mg level WNL.  Trop mildly elevated 0.36, which has been noted previously, likely from needing dialysis. EKG with AFib with RVR. HR improving with diltiazem, BP staying stable with infusion. Of note, she had some itching after PO contrast, so benadryl given. Awaiting U/A and CT, will reassess shortly.   2:28 PM CT A/P without acute findings, does have thick walled bladder which was previously noted, and diverticulosis without diverticulitis. Pt feeling better. HR in the low 100-110s, diltiazem just titrated up slightly since BP staying  stable (although after coming back from CT, apparently her IV was pulled out; nursing staff just got it restarted). Will proceed with admission for new onset Afib with RVR, and uremia with anion gap, among other conditions. Will also touch base with nephrology service. U/A not yet done, will work on this being done whenever she urinates.   2:38 PM Erin Hearing NP of [facility] returning page and will admit. Awaiting nephrology consultation. Holding orders to be placed by admitting team. Please see their notes for further documentation of care. I appreciate their help with this pleasant pt's care. Pt stable at time of admission.  2:52 PM Dr. Jonnie Finner of nephrology returning page, will consult on patient while she's admitted. Please see consultant notes and admission notes for further documentation of care. I appreciate everyone's help with this patient's care! Pt stable at this time.    Final Clinical Impressions(s) / ED Diagnoses   Final diagnoses:  Lower abdominal pain  Nausea vomiting and diarrhea  Atrial fibrillation with RVR ([place])  New onset a-fib (HCC)  Elevated troponin  Uremia  Type 2 diabetes mellitus with hyperglycemia, with long-term current use of insulin (HCC)  Transaminitis  Neutrophilic leukocytosis  Thrombocytopenia (HCC)  Diverticulosis of large intestine without hemorrhage    New Prescriptions New Prescriptions   No medications on [address], [place], [place] 04/03/2017 1453    Daleen Bo, MD 03/19/2017 [phone]

## 2017-03-29 NOTE — H&P (Signed)
History and Physical    [name] IEP:[id number] DOB: 1929-10-17 DOA: 04/02/2017   PCP: Renato Shin, MD   Attending physician: Denton Brick  Patient coming from/Resides with: Private residence/husband  Chief Complaint: Abdominal pain with nausea, 1 episode of vomiting and diarrhea  HPI: [name] is a 83 y.o. female with medical history significant for CKD dialysis for 2 years, CAD, chronic combined systolic and diastolic heart failure, dyslipidemia, hypertension, diabetes on oral agent, diverticulosis, peripheral vascular disease prior BKA in 2014, COPD and bradycardia. The patient has been experiencing generalized malaise for 2 weeks. This is been associated with significant hyperglycemia despite medication adherence, poor oral intake, one episode of chest pain, and palpitations. A have noted CBGs markedly elevated for at least one week. She followed up with her endocrinologist who increased her Tradjenta to 3 times a day. Family notes that patient's weight has decreased from 150 to the 130s over 2 weeks. Patient reports diaphoresis and dizziness on standing. In the ER patient was noted to have a glucose of 407 with an anion gap 19 and a serum CO2 of 20, sodium 128 consistent with DKA. She was found to be in atrial fibrillation which is a new problem with RVR and was started on a Cardizem infusion. Her AST and ALT were also elevated with a normal total bilirubin. Her troponin was mildly elevated at 0.36 but she has chronic elevations in troponin. She had leukocytosis with a white count of 13,500 with neutrophils 81% and absolute neutrophils 11%. She has had new thrombocytopenia with platelets 136,000. Urinalysis is pending. CT the abdomen and pelvis was obtained that showed no etiology to patient's abdominal symptoms. Given above findings concerns are for possible sepsis of undetermined etiology.  ED Course:  Vital Signs: BP 102/70   Pulse (!) 122   Temp 98.3 F (36.8 C)  (Oral)   Resp (!) 24   Ht 5' 6" (1.676 m)   Wt 61.7 kg (136 lb)   SpO2 93%   BMI 21.95 kg/m   Review of Systems:  In addition to the HPI above,  No Fever-chills, myalgias or other constitutional symptoms No Headache, changes with Vision or hearing, new weakness, tingling, numbness in any extremity, dizziness, dysarthria or word finding difficulty, gait disturbance or imbalance, tremors or seizure activity No problems swallowing food or Liquids, indigestion/reflux, choking or coughing while eating, abdominal pain with or after eating No Chest pain, Cough or Shortness of Breath, palpitations, orthopnea or DOE No Abdominal pain, N/V, melena,hematochezia, dark tarry stools, constipation No dysuria, malodorous urine, hematuria or flank pain No new skin rashes, lesions, masses or bruises, No new joint pains, aches, swelling or redness No recent unintentional weight gain or loss No polyuria, polydypsia or polyphagia   Past Medical History:  Diagnosis Date  . Arthritis   . Asthma    Mild  . Bradycardia    a. Not on BB due to this.  Marland Kitchen CAD (coronary artery disease)    a. s/p multiple caths with nonobs CAD;   b. cath 1/10: pLAD 20%, mLAD 40%, pCFX 20%, mCFX 40%, pRCA 60-70%;   c.  Myoview 06/02/12: Low anterior wall scar, no ischemia, EF 37%. d. cath 06/20/2014 70% mid LCx, medical therapy, high risk for PCI due to close proximity to large OM e. cath 03/07/15 pro RCA 50% & pro to mid Cx 75%, both stable, LVEDP 33-67m Hg-> medical management  . Cardiomyopathy ([place]    a. Echo 05/31/12: EF 40-45%. b. Echo 06/2014:  EF 50-55%. c. Cath 02/2015: EF 45-50%.  . Chronic combined systolic and diastolic CHF (congestive heart failure) (HCC)    a. EF 50-55%, mild LVH and grade 1 diast. Dysfxn b. Grade 3 Diastolic Dysfunction 60/7371. b. 02/2015: EF 45-50% by cath.  . CKD (chronic kidney disease), stage III [facility])    Dr. Lorrene Reid,    dialyzes Tuesday, Thursday, Saturday  . COPD (chronic obstructive pulmonary  disease) ([place])   . Depression   . Diabetes mellitus    type II; peripheral neuropathy  . Diverticulosis   . DM retinopathy ([place])   . Dyslipidemia   . GERD (gastroesophageal reflux disease)   . Hiatal hernia   . History of shingles   . Hypertension   . Noncompliance   . Obesity   . Osteoarthritis cervical spine   . Peptic stricture of esophagus   . Peptic ulcer disease    duodenal  . Peripheral vascular disease ([place])    a. s/p L BKA 08/2012.  Marland Kitchen Pruritic condition    Idiopathic  . Shortness of breath dyspnea   . Sleep apnea    with CPAP  . Uterine cancer ([place])   . Zoster     Past Surgical History:  Procedure Laterality Date  . ABDOMINAL ANGIOGRAM N/A 08/31/2012   Procedure: ABDOMINAL ANGIOGRAM with runoff poss intervention;  Surgeon: Angelia Mould, MD;  Location[facility] C[facility] LAB;  Service: Cardiovascular;  Laterality: N/A;  . AMPUTATION Left 09/04/2012   Procedure: AMPUTATION BELOW KNEE;  Surgeon: Angelia Mould, MD;  Location: [place];  Service: Vascular;  Laterality: Left;  . AV FISTULA PLACEMENT Left 05/10/2015   Procedure: CREATION OF LEFT UPPER ARM ARTERIOVENOUS (AV) FISTULA ;  Surgeon: Elam Dutch, MD;  Location: [place];  Service: Vascular;  Laterality: Left;  . BRAIN SURGERY     1997 blood clot on the brain, then had to relieve fluid on the brain  . CARDIAC CATHETERIZATION    . CARDIAC CATHETERIZATION N/A 03/07/2015   Procedure: Left Heart Cath and Coronary Angiography;  Surgeon: Leonie Man, MD;  Location: [place] CV LAB;  Service: Cardiovascular;  Laterality: N/A;  . CATARACT EXTRACTION, BILATERAL  2005  . CHOLECYSTECTOMY  2010  . CRANIOTOMY  1997   Left for SDH  . DEXA  7/05  . ESOPHAGOGASTRODUODENOSCOPY  04/04/2004  . EYE SURGERY    . HERNIA REPAIR    . KNEE ARTHROSCOPY  10/1998   Left  . LEFT HEART CATHETERIZATION WITH CORONARY ANGIOGRAM N/A 06/22/2014   Procedure: LEFT HEART CATHETERIZATION WITH CORONARY ANGIOGRAM;  Surgeon: Burnell Blanks, MD;  Location[facility] C[facility] LAB;  Service: Cardiovascular;  Laterality: N/A;  . MULTIPLE EXTRACTIONS WITH ALVEOLOPLASTY N/A 08/01/2014   Procedure: EXTRACTIONS OF TEETH NUMBERS _0 AND 19 AND ALVEOLOPLASTY UPPER LEFT AND RIGHT QUADRANT;  Surgeon: Isac Caddy, DDS;  Location: [place];  Service: Oral Surgery;  Laterality: N/A;  . SPINE SURGERY     C-spine and lumbar surgery  . TUBAL LIGATION  1967  . TUBAL LIGATION      Social History   Social History  . Marital status: Married    Spouse name: N/A  . Number of children: 7  . Years of education: N/A   Occupational History  . Retired Retired   Social History Main Topics  . Smoking status: Never Smoker  . Smokeless tobacco: Never Used  . Alcohol use No     Comment: rare  .  Drug use: No  . Sexual activity: No   Other Topics Concern  . Not on file   Social History Narrative  . No narrative on file    Mobility: Utilizes lower extremity prosthesis and rolling walker-due to above symptoms for the past 2 weeks has required a wheelchair Work history: Not obtained   Allergies  Allergen Reactions  . Iohexol Anaphylaxis, Itching and Rash     Code: RASH, Desc: [place] ON PT'S CHART ALLERGIC TO IV DYE 09/04/07/RM, Onset Date: [id number]     Family History  Problem Relation Age of Onset  . Cancer - Other Mother        "Stomach" Cancer  . Diabetes Mother   . Heart disease Mother   . Stomach cancer Mother   . Hypertension Mother   . Rheum arthritis Mother   . Lymphoma Father   . Hypertension Father   . Rheum arthritis Father   . Kidney disease Paternal Grandmother   . Stroke Paternal Grandmother   . Asthma Other   . Diabetes Sister   . Heart attack Neg Hx      Prior to Admission medications   Medication Sig Start Date End Date Taking? Authorizing Provider  acetaminophen (TYLENOL) 500 MG tablet Take 500 mg by mouth every 6 (six) hours as needed for mild pain.   Yes [provider]    albuterol (PROVENTIL HFA;VENTOLIN HFA) 108 (90 BASE) MCG/ACT inhaler Inhale 2 puffs into the lungs every 4 (four) hours as needed for wheezing or shortness of breath. 04/19/15  Yes Renato Shin, MD  allopurinol (ZYLOPRIM) 100 MG tablet TAKE 1 TABLET(100 MG) BY MOUTH DAILY 11/27/16  Yes Renato Shin, MD  aspirin EC 81 MG tablet Take 1 tablet (81 mg total) by mouth daily. 03/16/15  Yes Dunn, Dayna N, PA-C  atorvastatin (LIPITOR) 40 MG tablet TAKE 1 TABLET BY MOUTH DAILY AT 6 PM Patient taking differently: Take 40 mg by mouth daily at 6 PM. TAKE 1 TABLET BY MOUTH DAILY AT 6 PM 11/27/16  Yes Renato Shin, MD  clopidogrel (PLAVIX) 75 MG tablet Take 1 tablet (75 mg total) by mouth daily. 10/04/16  Yes Dorothy Spark, MD  colestipol (COLESTID) 1 g tablet Take 3 tablets (3 g total) by mouth daily. 02/26/17  Yes Renato Shin, MD  donepezil (ARICEPT) 5 MG tablet TAKE 1 TABLET(5 MG) BY MOUTH AT BEDTIME 11/27/16  Yes Renato Shin, MD  ezetimibe (ZETIA) 10 MG tablet Take 1 tablet (10 mg total) by mouth every morning. 12/20/16  Yes Renato Shin, MD  feeding supplement (GLUCERNA SHAKE) LIQD Take 237 mLs by mouth every morning.    Yes [provider]  Fluticasone-Salmeterol (ADVAIR DISKUS) 100-50 MCG/DOSE AEPB Inhale 1 puff into the lungs 2 (two) times daily. 04/19/15  Yes Renato Shin, MD  gabapentin (NEURONTIN) 100 MG capsule Take 1 capsule (100 mg total) by mouth at bedtime. 12/20/16  Yes Renato Shin, MD  hydrALAZINE (APRESOLINE) 25 MG tablet TAKE 1 TABLET(25 MG) BY MOUTH THREE TIMES DAILY Patient taking differently: Take 25 mg by mouth 3 (three) times daily. TAKE 1 TABLET(25 MG) BY MOUTH THREE TIMES DAILY 12/20/16  Yes Renato Shin, MD  isosorbide mononitrate (IMDUR) 60 MG 24 hr tablet Take 90 mg by mouth daily. 12/20/16  Yes [provider]  latanoprost (XALATAN) 0.005 % ophthalmic solution Place 1 drop into both eyes at bedtime.   Yes [provider]  memantine (NAMENDA) 5 MG tablet  Take 1 tablet (5 mg  total) by mouth 2 (two) times daily. 12/20/16  Yes Renato Shin, MD  nitroGLYCERIN (NITROSTAT) 0.4 MG SL tablet Place 1 tablet (0.4 mg total) under the tongue every 5 (five) minutes as needed for chest pain. 03/04/17  Yes Dorothy Spark, MD  pantoprazole (PROTONIX) 40 MG tablet TAKE 1 TABLET BY MOUTH TWICE DAILY Patient taking differently: TAKE 40 mg  TABLET BY MOUTH TWICE DAILY 01/14/17  Yes Renato Shin, MD  predniSONE (DELTASONE) 20 MG tablet Take 20 mg by mouth at bedtime. 03/27/17  Yes [provider]  RANEXA 500 MG 12 hr tablet TAKE 1 TABLET BY MOUTH TWICE DAILY Patient taking differently: TAKE 500 mg TABLET BY MOUTH TWICE DAILY 02/27/17  Yes Dorothy Spark, MD  repaglinide (PRANDIN) 2 MG tablet Take 1 tablet (2 mg total) by mouth 3 (three) times daily before meals. 02/19/17  Yes Renato Shin, MD  sertraline (ZOLOFT) 100 MG tablet TAKE 1 TABLET(100 MG) BY MOUTH DAILY 12/20/16  Yes Renato Shin, MD  insulin NPH-regular Human (NOVOLIN 70/30) (70-30) 100 UNIT/ML injection Inject 5 Units into the skin daily with breakfast. And syringes 1/day 04/15/2017   Renato Shin, MD  isosorbide mononitrate (IMDUR) 30 MG 24 hr tablet Take 3 tablets (90 mg total) by mouth daily. Patient not taking: Reported on 03/22/2017 03/26/17   Renato Shin, MD    Physical Exam: Vitals:   04/13/2017 1330  1400 03/22/2017 1427 04/03/2017 1430  BP: 101/72 111/62 107/65 102/70  Pulse:   (!) 122   Resp:  19 (!) 21 (!) 24  Temp:      TempSrc:      SpO2:   93%   Weight:      Height:          Constitutional: NAD, calm, comfortable Eyes: PERRL, lids and conjunctivae normal ENMT: Mucous membranes are Very dry-lips are cracked. Posterior pharynx clear of any exudate or lesions. Neck: normal, supple, no masses, no thyromegaly Respiratory: clear to auscultation bilaterally, no wheezing, no crackles. Normal respiratory effort. No accessory muscle use.  Cardiovascular: Regular rate and  rhythm, no murmurs / rubs / gallops. No extremity edema. 1+ pedal pulse on left. No carotid bruits. Extremities are cool to touch Abdomen: Diffuse generalized tenderness without guarding or rebounding, no masses palpated. No hepatosplenomegaly. Bowel sounds positive.  Musculoskeletal: no clubbing / cyanosis. No joint deformity upper and lower extremities. Good ROM, no contractures. Normal muscle tone. Right BKA Skin: no rashes, lesions, ulcers. No induration Neurologic: CN 2-12 grossly intact. Sensation intact, DTR normal. Strength 5/5 x all 4 extremities.  Psychiatric: Normal judgment and insight. Alert and oriented x 3. Normal mood.    Labs on Admission: I have personally reviewed following labs and imaging studies  CBC:  Recent Labs Lab 04/13/2017 1012  WBC 13.5*  NEUTROABS 11.0*  HGB 12.4  HCT 37.7  MCV 88.9  PLT 161*   Basic Metabolic Panel:  Recent Labs Lab 04/05/2017 1012 03/26/2017 1106  NA 128*  --   K 4.4  --   CL 89*  --   CO2 20*  --   GLUCOSE 407*  --   BUN 39*  --   CREATININE 5.48*  --   CALCIUM 8.5*  --   MG  --  2.3   GFR: Estimated Creatinine Clearance: 7 mL/min (A) (by C-G formula based on SCr of 5.48 mg/dL (H)). Liver Function Tests:  Recent Labs Lab 04/13/2017 1012  AST 113*  ALT 83*  ALKPHOS 121  BILITOT  1.2  PROT 6.7  ALBUMIN 3.9    Recent Labs Lab 03/26/2017 1012  LIPASE 30   No results for input(s): AMMONIA in the last 168 hours. Coagulation Profile: No results for input(s): INR, PROTIME in the last 168 hours. Cardiac Enzymes: No results for input(s): CKTOTAL, CKMB, CKMBINDEX, TROPONINI in the last 168 hours. BNP (last 3 results) No results for input(s): PROBNP in the last 8760 hours. HbA1C: No results for input(s): HGBA1C in the last 72 hours. CBG: No results for input(s): GLUCAP in the last 168 hours. Lipid Profile: No results for input(s): CHOL, HDL, LDLCALC, TRIG, CHOLHDL, LDLDIRECT in the last 72 hours. Thyroid Function  Tests: No results for input(s): TSH, T4TOTAL, FREET4, T3FREE, THYROIDAB in the last 72 hours. Anemia Panel: No results for input(s): VITAMINB12, FOLATE, FERRITIN, TIBC, IRON, RETICCTPCT in the last 72 hours. Urine analysis:    Component Value Date/Time   COLORURINE RED (A) 04/30/2015 1538   APPEARANCEUR TURBID (A) 04/30/2015 1538   LABSPEC 1.025 04/30/2015 1538   PHURINE 5.0 04/30/2015 1538   GLUCOSEU 100 (A) 04/30/2015 1538   GLUCOSEU NEGATIVE 08/18/2014 1119   HGBUR LARGE (A) 04/30/2015 1538   HGBUR negative 09/03/2007 1045   BILIRUBINUR SMALL (A) 04/30/2015 1538   BILIRUBINUR negative 01/03/2011 1038   KETONESUR 15 (A) 04/30/2015 1538   PROTEINUR 100 (A) 04/30/2015 1538   UROBILINOGEN 1.0 04/30/2015 1538   NITRITE NEGATIVE 04/30/2015 1538   LEUKOCYTESUR MODERATE (A) 04/30/2015 1538   Sepsis Labs: _0 (procalcitonin:4,lacticidven:4) )No results found for this or any previous visit (from the past 240 hour(s)).   Radiological Exams on Admission: Ct Abdomen Pelvis Wo Contrast  Result Date: 03/23/2017 CLINICAL DATA:  Lower abdominal pain, nausea/vomiting/diarrhea EXAM: CT ABDOMEN AND PELVIS WITHOUT CONTRAST TECHNIQUE: Multidetector CT imaging of the abdomen and pelvis was performed following the standard protocol without IV contrast. COMPARISON:  11/14/2016 FINDINGS: Lower chest: Lung bases are clear. Cardiomegaly. Coronary atherosclerosis. Mitral valve annular calcifications. Hepatobiliary: Unenhanced liver is unremarkable. Status post cholecystectomy. No intrahepatic extrahepatic ductal dilatation. Pancreas: Within normal limits. Spleen: Within normal limits. Adrenals/Urinary Tract: Adrenal glands are within normal limits. Bilateral renal cortical atrophy. No renal calculi or hydronephrosis. Bladder is thick-walled. Associated hyperdense fluid in the bladder is presumably related to prior contrast administration (prior CT, cardiovascular, or interventional procedure) in a patient  who is relatively anuric. Stomach/Bowel: Step stomach is within normal limits. No evidence of bowel obstruction. Appendix is not discretely visualized. Sigmoid diverticulosis, without evidence of diverticulitis. Vascular/Lymphatic: No evidence of abdominal aortic aneurysm. Atherosclerotic calcifications of the abdominal aorta and branch vessels. No suspicious abdominopelvic lymphadenopathy. Reproductive: Uterus is heterogeneous with small calcified uterine fibroids. No adnexal masses. Other: No abdominopelvic ascites. Musculoskeletal: Mild degenerative changes of the visualized thoracolumbar spine. Healed fracture of the right iliac wing. IMPRESSION: No evidence of bowel obstruction. Sigmoid diverticulosis, without evidence of diverticulitis. No CT findings to account for the patient's lower abdominal pain. Additional ancillary findings as above. Electronically Signed   By: Julian Hy M.D.   On: 03/22/2017 13:38    EKG: (Independently reviewed) atrial fib/flutter with a ventricular rate of 138 bpm, QTC 534 ms in the setting of underlying right bundle branch block, normal R-wave rotation, subtle downsloping ST segments in inferior lateral leads likely rate related-I recommend follow-up EKG when rate controlled; when compared to EKG from 10/10 ST segment downsloping as mentioned above slightly more prominent on today's EKG  Assessment/Plan Principal Problem:   Atrial fibrillation with RVR  -Patient presents with GI  symptoms associated with reported nausea/vomiting/diarrhea and DKA and was found to have new onset atrial fibrillation with rapid rate -Suspect dehydration as precipitant -Has been given 500 mL saline bolus in the ER-give additional 500 mL saline bolus and start fluids at 50/hr-utilize additional boluses if develops hypotension -Continue Cardizem infusion -CHADVASC=7 -We'll utilize heparin 5000 units subcutaneous every 8 hours for now and will reevaluate utility of long-term  anticoagulation in the next 24 hours given patient's advanced age and need for dialysis  Active Problems:   DKA (diabetic ketoacidoses)  -Presents with CBG greater than 400 with anion gap 19 -Insulin infusion until anion gap closed -Modified volume resuscitation as described above given underlying need for chronic dialysis -Reported elevated CBGs for at least one week despite adjustments and oral medications by endocrinologist therefore ? Sepsis as precipitant -Follow electrolytes every 4 hours -HgbA1c was 7.5 on 02/19/17    ? Sepsis  -Patient presents with profound dehydration, apparent acute kidney injury on stage V chronic kidney disease, new thrombocytopenia, mild nonobstructive transaminitis, elevated white count with left shift, and metabolic acidemia in the setting of DKA -Urinalysis pending -Obtain urine culture and blood cultures -ESR lactic acid -Patient is on chronic steroids prednisone 20 daily at bedtime-will continue-?? Immunocompromised -Hold on empiric antibiotics for now    CKD (chronic kidney disease), stage V on HD -Dialyzes on TTS and did not dialyze today -Nephrology consulted by EDP -Family reports rapid weight reduction from 150 to 136 pounds over 2 weeks -Has stent in left dialysis access that patient's family reports needs to be removed within the next week    CAD (coronary artery disease) -a. s/p multiple caths with nonobs CAD;   b. cath 1/10: pLAD 20%, mLAD 40%, pCFX 20%, mCFX 40%, pRCA 60-70%;   c.  Myoview 06/02/12: Low anterior wall scar, no ischemia, EF 37%. d. cath 06/20/2014 70% mid LCx, medical therapy, high risk for PCI due to close proximity to large OM e. cath 03/07/15 pro RCA 50% & pro to mid Cx 75%, both stable, LVEDP 33-68m Hg-> medical management -Patient presents with one episode of chest pain last week-currently chest pain-free -New-onset A. fib/RVR so could explain chest pain -In setting of profound dehydration, DKA, A. fib RVR and possible sepsis  blood pressure suboptimal and therefore will not give home Imdur, Ranexa -Continue Plavix -Has chronically elevated troponin-if chest pain recurs will need to cycle and obtain repeat EKG -Current EKG was subtle downsloping ST segments in inferior lateral leads in setting of RVR and at this time suspect is rate related    Hypertension -Holding antihypertensive agents as above    Asthma -Continue prednisone, Advair and albuterol    Dyslipidemia -Hold zetia and Lipitor while nothing by mouth    Dementia -Hold Aricept and Namenda in setting of nausea and nothing by mouth status      DVT prophylaxis: Subcutaneous heparin Code Status: Full  Family Communication: Family at bedside  Disposition Plan: Home Consults called: Nephrology/[place]     ESamella ParrANP-BC [facility] Pager [phone]  If 7PM-7AM, please contact night-coverage [email or web address] Password [email or web address]  04/10/2017, 3:03 PM

## 2017-03-29 NOTE — ED Notes (Signed)
Patient transported to CT 

## 2017-03-30 ENCOUNTER — Inpatient Hospital Stay (HOSPITAL_COMMUNITY): Payer: Medicare HMO

## 2017-03-30 ENCOUNTER — Other Ambulatory Visit (HOSPITAL_COMMUNITY): Payer: Medicare HMO

## 2017-03-30 DIAGNOSIS — E785 Hyperlipidemia, unspecified: Secondary | ICD-10-CM

## 2017-03-30 DIAGNOSIS — J45909 Unspecified asthma, uncomplicated: Secondary | ICD-10-CM

## 2017-03-30 DIAGNOSIS — Z789 Other specified health status: Secondary | ICD-10-CM

## 2017-03-30 DIAGNOSIS — J9690 Respiratory failure, unspecified, unspecified whether with hypoxia or hypercapnia: Secondary | ICD-10-CM

## 2017-03-30 DIAGNOSIS — R197 Diarrhea, unspecified: Secondary | ICD-10-CM

## 2017-03-30 DIAGNOSIS — N185 Chronic kidney disease, stage 5: Secondary | ICD-10-CM

## 2017-03-30 DIAGNOSIS — R9431 Abnormal electrocardiogram [ECG] [EKG]: Secondary | ICD-10-CM

## 2017-03-30 DIAGNOSIS — I469 Cardiac arrest, cause unspecified: Secondary | ICD-10-CM

## 2017-03-30 DIAGNOSIS — R112 Nausea with vomiting, unspecified: Secondary | ICD-10-CM

## 2017-03-30 DIAGNOSIS — I4891 Unspecified atrial fibrillation: Secondary | ICD-10-CM

## 2017-03-30 DIAGNOSIS — N186 End stage renal disease: Secondary | ICD-10-CM

## 2017-03-30 DIAGNOSIS — E1110 Type 2 diabetes mellitus with ketoacidosis without coma: Principal | ICD-10-CM

## 2017-03-30 DIAGNOSIS — R748 Abnormal levels of other serum enzymes: Secondary | ICD-10-CM

## 2017-03-30 DIAGNOSIS — I10 Essential (primary) hypertension: Secondary | ICD-10-CM

## 2017-03-30 DIAGNOSIS — I25119 Atherosclerotic heart disease of native coronary artery with unspecified angina pectoris: Secondary | ICD-10-CM

## 2017-03-30 LAB — COMPREHENSIVE METABOLIC PANEL
ALBUMIN: 2.9 g/dL — AB (ref 3.5–5.0)
ALK PHOS: 131 U/L — AB (ref 38–126)
ALT: 1247 U/L — ABNORMAL HIGH (ref 14–54)
ANION GAP: 31 — AB (ref 5–15)
AST: 2356 U/L — ABNORMAL HIGH (ref 15–41)
BUN: 49 mg/dL — ABNORMAL HIGH (ref 6–20)
CALCIUM: 8.4 mg/dL — AB (ref 8.9–10.3)
CO2: 10 mmol/L — AB (ref 22–32)
Chloride: 95 mmol/L — ABNORMAL LOW (ref 101–111)
Creatinine, Ser: 6.35 mg/dL — ABNORMAL HIGH (ref 0.44–1.00)
GFR calc non Af Amer: 5 mL/min — ABNORMAL LOW (ref >60)
GFR, EST AFRICAN AMERICAN: 6 mL/min — AB (ref >60)
GLUCOSE: 99 mg/dL (ref 65–99)
POTASSIUM: 5.3 mmol/L — AB (ref 3.5–5.1)
SODIUM: 136 mmol/L (ref 135–145)
TOTAL PROTEIN: 5.1 g/dL — AB (ref 6.5–8.1)
Total Bilirubin: 2.4 mg/dL — ABNORMAL HIGH (ref 0.3–1.2)

## 2017-03-30 LAB — BASIC METABOLIC PANEL
Anion gap: 18 — ABNORMAL HIGH (ref 5–15)
Anion gap: 19 — ABNORMAL HIGH (ref 5–15)
BUN: 44 mg/dL — AB (ref 6–20)
BUN: 46 mg/dL — AB (ref 6–20)
CALCIUM: 8 mg/dL — AB (ref 8.9–10.3)
CALCIUM: 8 mg/dL — AB (ref 8.9–10.3)
CO2: 20 mmol/L — ABNORMAL LOW (ref 22–32)
CO2: 18 mmol/L — ABNORMAL LOW (ref 22–32)
CREATININE: 5.76 mg/dL — AB (ref 0.44–1.00)
CREATININE: 5.79 mg/dL — AB (ref 0.44–1.00)
Chloride: 96 mmol/L — ABNORMAL LOW (ref 101–111)
Chloride: 96 mmol/L — ABNORMAL LOW (ref 101–111)
GFR, EST AFRICAN AMERICAN: 7 mL/min — AB (ref >60)
GFR, EST AFRICAN AMERICAN: 7 mL/min — AB (ref >60)
GFR, EST NON AFRICAN AMERICAN: 6 mL/min — AB (ref >60)
GFR, EST NON AFRICAN AMERICAN: 6 mL/min — AB (ref >60)
Glucose, Bld: 193 mg/dL — ABNORMAL HIGH (ref 65–99)
Glucose, Bld: 158 mg/dL — ABNORMAL HIGH (ref 65–99)
Potassium: 4.3 mmol/L (ref 3.5–5.1)
Potassium: 4.1 mmol/L (ref 3.5–5.1)
SODIUM: 133 mmol/L — AB (ref 135–145)
Sodium: 134 mmol/L — ABNORMAL LOW (ref 135–145)

## 2017-03-30 LAB — GLUCOSE, CAPILLARY
GLUCOSE-CAPILLARY: 162 mg/dL — AB (ref 65–99)
GLUCOSE-CAPILLARY: 185 mg/dL — AB (ref 65–99)
GLUCOSE-CAPILLARY: 124 mg/dL — AB (ref 65–99)
Glucose-Capillary: 101 mg/dL — ABNORMAL HIGH (ref 65–99)
Glucose-Capillary: 103 mg/dL — ABNORMAL HIGH (ref 65–99)
Glucose-Capillary: 107 mg/dL — ABNORMAL HIGH (ref 65–99)
Glucose-Capillary: 113 mg/dL — ABNORMAL HIGH (ref 65–99)
Glucose-Capillary: 117 mg/dL — ABNORMAL HIGH (ref 65–99)
Glucose-Capillary: 127 mg/dL — ABNORMAL HIGH (ref 65–99)
Glucose-Capillary: 133 mg/dL — ABNORMAL HIGH (ref 65–99)
Glucose-Capillary: 163 mg/dL — ABNORMAL HIGH (ref 65–99)
Glucose-Capillary: 166 mg/dL — ABNORMAL HIGH (ref 65–99)
Glucose-Capillary: 182 mg/dL — ABNORMAL HIGH (ref 65–99)
Glucose-Capillary: 187 mg/dL — ABNORMAL HIGH (ref 65–99)
Glucose-Capillary: 55 mg/dL — ABNORMAL LOW (ref 65–99)
Glucose-Capillary: 67 mg/dL (ref 65–99)
Glucose-Capillary: 80 mg/dL (ref 65–99)

## 2017-03-30 LAB — CBC
HCT: 37.6 % (ref 36.0–46.0)
Hemoglobin: 12 g/dL (ref 12.0–15.0)
MCH: 30.3 pg (ref 26.0–34.0)
MCHC: 31.9 g/dL (ref 30.0–36.0)
MCV: 94.9 fL (ref 78.0–100.0)
Platelets: DECREASED 10*3/uL (ref 150–400)
RBC: 3.96 MIL/uL (ref 3.87–5.11)
RDW: 16.7 % — AB (ref 11.5–15.5)
WBC: 21.8 10*3/uL — ABNORMAL HIGH (ref 4.0–10.5)

## 2017-03-30 LAB — CBC WITH DIFFERENTIAL/PLATELET
BASOS ABS: 0 10*3/uL (ref 0.0–0.1)
Basophils Relative: 0 %
EOS ABS: 0 10*3/uL (ref 0.0–0.7)
Eosinophils Relative: 0 %
HCT: 36.2 % (ref 36.0–46.0)
HEMOGLOBIN: 11.6 g/dL — AB (ref 12.0–15.0)
LYMPHS ABS: 1 10*3/uL (ref 0.7–4.0)
Lymphocytes Relative: 4 %
MCH: 30.5 pg (ref 26.0–34.0)
MCHC: 32 g/dL (ref 30.0–36.0)
MCV: 95.3 fL (ref 78.0–100.0)
MONO ABS: 1 10*3/uL (ref 0.1–1.0)
MONOS PCT: 4 %
NEUTROS ABS: 22.2 10*3/uL — AB (ref 1.7–7.7)
Neutrophils Relative %: 92 %
Platelets: 86 10*3/uL — ABNORMAL LOW (ref 150–400)
RBC: 3.8 MIL/uL — AB (ref 3.87–5.11)
RDW: 16.8 % — ABNORMAL HIGH (ref 11.5–15.5)
WBC: 24.2 10*3/uL — AB (ref 4.0–10.5)

## 2017-03-30 LAB — POCT I-STAT 3, ART BLOOD GAS (G3+)
Acid-base deficit: 22 mmol/L — ABNORMAL HIGH (ref 0.0–2.0)
BICARBONATE: 8.7 mmol/L — AB (ref 20.0–28.0)
O2 Saturation: 95 %
PCO2 ART: 36.3 mmHg (ref 32.0–48.0)
TCO2: 10 mmol/L — ABNORMAL LOW (ref 22–32)
pH, Arterial: 6.987 — CL (ref 7.350–7.450)
pO2, Arterial: 112 mmHg — ABNORMAL HIGH (ref 83.0–108.0)

## 2017-03-30 LAB — LACTIC ACID, PLASMA: LACTIC ACID, VENOUS: 15.5 mmol/L — AB (ref 0.5–1.9)

## 2017-03-30 LAB — MRSA PCR SCREENING: MRSA BY PCR: NEGATIVE

## 2017-03-30 LAB — TROPONIN I: Troponin I: 0.33 ng/mL (ref <0.03)

## 2017-03-30 LAB — HEPARIN LEVEL (UNFRACTIONATED): HEPARIN UNFRACTIONATED: 0.12 [IU]/mL — AB (ref 0.30–0.70)

## 2017-03-30 MED ORDER — INSULIN ASPART 100 UNIT/ML ~~LOC~~ SOLN: 0.0000 [IU] | Freq: Every day | SUBCUTANEOUS | Status: DC

## 2017-03-30 MED ORDER — MIDAZOLAM HCL 2 MG/2ML IJ SOLN: 1.0000 mg | INTRAMUSCULAR | Status: DC | PRN

## 2017-03-30 MED ORDER — HEPARIN SODIUM (PORCINE) 5000 UNIT/ML IJ SOLN
5000.0000 [IU] | Freq: Three times a day (TID) | INTRAMUSCULAR | Status: DC
  Administered 2017-03-30 – 2017-03-31 (×2): 5000 [IU] via SUBCUTANEOUS
  Filled 2017-03-30 (×2): qty 1

## 2017-03-30 MED ORDER — SODIUM CHLORIDE 0.9 % IV BOLUS (SEPSIS): 1000.0000 mL | Freq: Once | INTRAVENOUS | Status: DC

## 2017-03-30 MED ORDER — INSULIN DETEMIR 100 UNIT/ML ~~LOC~~ SOLN
15.0000 [IU] | Freq: Every day | SUBCUTANEOUS | Status: DC
  Administered 2017-03-30: 15 [IU] via SUBCUTANEOUS
  Filled 2017-03-30: qty 0.15

## 2017-03-30 MED ORDER — INSULIN ASPART 100 UNIT/ML ~~LOC~~ SOLN
2.0000 [IU] | SUBCUTANEOUS | Status: DC
Start: 2017-03-30 — End: 2017-03-31
  Administered 2017-03-31: 4 [IU] via SUBCUTANEOUS

## 2017-03-30 MED ORDER — INSULIN ASPART 100 UNIT/ML ~~LOC~~ SOLN: 0.0000 [IU] | Freq: Three times a day (TID) | SUBCUTANEOUS | Status: DC

## 2017-03-30 MED ORDER — FENTANYL CITRATE (PF) 100 MCG/2ML IJ SOLN: 50.0000 ug | Freq: Once | INTRAMUSCULAR | Status: DC

## 2017-03-30 MED ORDER — METOPROLOL TARTRATE 25 MG PO TABS
25.0000 mg | ORAL_TABLET | Freq: Two times a day (BID) | ORAL | Status: DC
  Administered 2017-03-30: 25 mg via ORAL
  Filled 2017-03-30: qty 1

## 2017-03-30 MED ORDER — SODIUM BICARBONATE 8.4 % IV SOLN
INTRAVENOUS | Status: AC
  Filled 2017-03-30: qty 100

## 2017-03-30 MED ORDER — MIDAZOLAM HCL 2 MG/2ML IJ SOLN
2.0000 mg | Freq: Once | INTRAMUSCULAR | Status: AC
  Administered 2017-03-30: 2 mg via INTRAVENOUS

## 2017-03-30 MED ORDER — MORPHINE SULFATE (PF) 4 MG/ML IV SOLN: 1.0000 mg | INTRAVENOUS | Status: DC | PRN

## 2017-03-30 MED ORDER — GI COCKTAIL ~~LOC~~
30.0000 mL | Freq: Three times a day (TID) | ORAL | Status: DC | PRN
  Administered 2017-03-30: 30 mL via ORAL
  Filled 2017-03-30: qty 30

## 2017-03-30 MED ORDER — NOREPINEPHRINE BITARTRATE 1 MG/ML IV SOLN
0.0000 ug/min | INTRAVENOUS | Status: DC
  Administered 2017-03-31 (×2): 10 ug/min via INTRAVENOUS
  Filled 2017-03-30 (×3): qty 4

## 2017-03-30 MED ORDER — PANTOPRAZOLE SODIUM 40 MG IV SOLR
40.0000 mg | Freq: Two times a day (BID) | INTRAVENOUS | Status: DC
  Administered 2017-03-30 – 2017-03-31 (×3): 40 mg via INTRAVENOUS
  Filled 2017-03-30 (×3): qty 40

## 2017-03-30 MED ORDER — TRAMADOL HCL 50 MG PO TABS
50.0000 mg | ORAL_TABLET | Freq: Four times a day (QID) | ORAL | Status: DC | PRN
Start: 2017-03-30 — End: 2017-03-30

## 2017-03-30 MED ORDER — MIDAZOLAM HCL 2 MG/2ML IJ SOLN
INTRAMUSCULAR | Status: AC
  Filled 2017-03-30: qty 2

## 2017-03-30 MED ORDER — HEPARIN (PORCINE) IN NACL 100-0.45 UNIT/ML-% IJ SOLN
850.0000 [IU]/h | INTRAMUSCULAR | Status: DC
  Administered 2017-03-30 (×2): 850 [IU]/h via INTRAVENOUS
  Filled 2017-03-30 (×2): qty 250

## 2017-03-30 MED ORDER — PANTOPRAZOLE SODIUM 40 MG PO TBEC
40.0000 mg | DELAYED_RELEASE_TABLET | Freq: Two times a day (BID) | ORAL | Status: DC
  Administered 2017-03-30: 40 mg via ORAL
  Filled 2017-03-30: qty 1

## 2017-03-30 MED ORDER — SODIUM BICARBONATE 8.4 % IV SOLN
INTRAVENOUS | Status: DC
  Administered 2017-03-30 – 2017-03-31 (×2): via INTRAVENOUS
  Filled 2017-03-30 (×2): qty 150

## 2017-03-30 MED ORDER — FENTANYL 2500MCG IN NS 250ML (10MCG/ML) PREMIX INFUSION
25.0000 ug/h | INTRAVENOUS | Status: DC
  Administered 2017-03-30: 50 ug/h via INTRAVENOUS

## 2017-03-30 MED ORDER — FENTANYL BOLUS VIA INFUSION
25.0000 ug | INTRAVENOUS | Status: DC | PRN
  Filled 2017-03-30: qty 25

## 2017-03-30 MED ORDER — MORPHINE SULFATE (PF) 2 MG/ML IV SOLN: 1.0000 mg | INTRAVENOUS | Status: DC | PRN

## 2017-03-30 MED ORDER — EZETIMIBE 10 MG PO TABS
10.0000 mg | ORAL_TABLET | Freq: Every morning | ORAL | Status: DC
  Administered 2017-03-30: 10 mg via ORAL
  Filled 2017-03-30: qty 1

## 2017-03-30 MED ORDER — ONDANSETRON HCL 4 MG/2ML IJ SOLN
4.0000 mg | Freq: Four times a day (QID) | INTRAMUSCULAR | Status: DC | PRN
  Administered 2017-03-30: 4 mg via INTRAVENOUS
  Filled 2017-03-30: qty 2

## 2017-03-30 MED ORDER — CHLORHEXIDINE GLUCONATE 0.12% ORAL RINSE (MEDLINE KIT)
15.0000 mL | Freq: Two times a day (BID) | OROMUCOSAL | Status: DC
  Administered 2017-03-30 – 2017-03-31 (×2): 15 mL via OROMUCOSAL

## 2017-03-30 MED ORDER — FENTANYL 2500MCG IN NS 250ML (10MCG/ML) PREMIX INFUSION
25.0000 ug/h | INTRAVENOUS | Status: DC
  Filled 2017-03-30: qty 250

## 2017-03-30 MED ORDER — DEXTROSE 50 % IV SOLN: 25.0000 mL | Freq: Once | INTRAVENOUS | Status: AC

## 2017-03-30 MED ORDER — DEXTROSE 50 % IV SOLN
INTRAVENOUS | Status: AC
  Administered 2017-03-30: 25 mL
  Filled 2017-03-30: qty 50

## 2017-03-30 MED ORDER — SODIUM BICARBONATE 8.4 % IV SOLN
100.0000 meq | Freq: Once | INTRAVENOUS | Status: AC
  Administered 2017-03-30: 100 meq via INTRAVENOUS

## 2017-03-30 MED ORDER — FENTANYL 2500MCG IN NS 250ML (10MCG/ML) PREMIX INFUSION: 10.0000 ug/h | INTRAVENOUS | Status: DC

## 2017-03-30 MED ORDER — ATROPINE SULFATE 1 MG/10ML IJ SOSY
PREFILLED_SYRINGE | INTRAMUSCULAR | Status: AC
  Filled 2017-03-30: qty 10

## 2017-03-30 MED ORDER — HYDROCORTISONE NA SUCCINATE PF 100 MG IJ SOLR
100.0000 mg | Freq: Three times a day (TID) | INTRAMUSCULAR | Status: DC
  Administered 2017-03-30 – 2017-03-31 (×2): 100 mg via INTRAVENOUS
  Filled 2017-03-30 (×2): qty 2

## 2017-03-30 MED ORDER — ORAL CARE MOUTH RINSE
15.0000 mL | OROMUCOSAL | Status: DC
  Administered 2017-03-30 – 2017-03-31 (×6): 15 mL via OROMUCOSAL

## 2017-03-30 MED ORDER — MEMANTINE HCL 5 MG PO TABS
5.0000 mg | ORAL_TABLET | Freq: Two times a day (BID) | ORAL | Status: DC
  Administered 2017-03-30: 5 mg via ORAL
  Filled 2017-03-30 (×2): qty 1

## 2017-03-30 NOTE — Progress Notes (Signed)
CRITICAL VALUE ALERT  Critical Value:  Lactic Acid 15.5  Date & Time Notied:  03/30/2017 at 2205  Provider Notified: Warren [name], MD   Orders Received/Actions taken: No new orders given at this time

## 2017-03-30 NOTE — Progress Notes (Signed)
BS 166 rate maintained at 0.76ml/h

## 2017-03-30 NOTE — Consult Note (Addendum)
.. ..  Name: [name] MRN: [id number] DOB: December 08, 1929    ADMISSION DATE:  04/07/2017 CONSULTATION DATE:03/30/17 REFERRING MD : Starla Link MD  CHIEF COMPLAINT: s/p CODE BLUE BRIEF PATIENT DESCRIPTION: 83 year old female PMHx ESRD on HD TTS ( missed Sat HD), CAD, combined chronic systolic and diastolic heart failure ( ECHO pending official read), dyslipidemia, HTN, IDDM ( previously was only on an oral agent), diverticulosis, PVD status post left BKA in 2014, COPD and bradycardia presented with abdominal pain with nausea and vomiting. She was found to be in new onset atrial fibrillation with rapid ventricular rate along with DKA with leukocytosis and mildly positive troponins. She was started on insulin drip and Cardizem drip. S/p CODE BLUE on 03/30/17 @7 :27 PM  SIGNIFICANT EVENTS  CODE BLUE- 03/30/17- 7:43PM  STUDIES:  CXR- post intubation ECHO- completed earlier today   HISTORY OF PRESENT ILLNESS:  83 year old female PMHx ESRD on HD TTS ( missed Sat HD), CAD, combined chronic systolic and diastolic heart failure ( ECHO pending official read), dyslipidemia, HTN, IDDM ( previously was only on an oral agent), diverticulosis, PVD status post left BKA in 2014, COPD and bradycardia presented with abdominal pain with nausea and vomiting. She was found to be in new onset atrial fibrillation with rapid ventricular rate along with DKA with leukocytosis and mildly positive troponins. She was started on insulin drip and Cardizem drip. Recently started on Metoprolol S/p CODE BLUE on 03/30/17 @7 :43 PM Lasted 18 mins and pt was intubated and received Atropine, Calcium, Epi and was started on Levophed. Transferred to ICU. Initial ABG ph 6.9  PAST MEDICAL HISTORY :   has a past medical history of Arthritis; Asthma; Bradycardia; CAD (coronary artery disease); Cardiomyopathy ([place]); Chronic combined systolic and diastolic CHF (congestive heart failure) ([place]); CKD (chronic kidney disease), stage III (H[facility]);  COPD (chronic obstructive pulmonary disease) ([place]); Depression; Diabetes mellitus; Diverticulosis; DM retinopathy ([place]); Dyslipidemia; GERD (gastroesophageal reflux disease); Hiatal hernia; History of shingles; Hypertension; Noncompliance; Obesity; Osteoarthritis cervical spine; Peptic stricture of esophagus; Peptic ulcer disease; Peripheral vascular disease ([place]); Pruritic condition; Shortness of breath dyspnea; Sleep apnea; Uterine cancer ([place]); and Zoster.  has a past surgical history that includes Tubal ligation (1967); Knee arthroscopy (10/1998); Craniotomy (1997); Cataract extraction, bilateral (2005); Hernia repair; Esophagogastroduodenoscopy (04/04/2004); Spine surgery; Cholecystectomy (2010); Cardiac catheterization; DEXA (7/05); Amputation (Left, 09/04/2012); abdominal angiogram (N/A, 08/31/2012); Tubal ligation; left heart catheterization with coronary angiogram (N/A, 06/22/2014); Eye surgery; Brain surgery; Multiple extractions with alveoloplasty (N/A, 08/01/2014); Cardiac catheterization (N/A, 03/07/2015); and AV fistula placement (Left, 05/10/2015). Prior to Admission medications   Medication Sig Start Date End Date Taking? Authorizing Provider  acetaminophen (TYLENOL) 500 MG tablet Take 500 mg by mouth every 6 (six) hours as needed for mild pain.   Yes [provider]  albuterol (PROVENTIL HFA;VENTOLIN HFA) 108 (90 BASE) MCG/ACT inhaler Inhale 2 puffs into the lungs every 4 (four) hours as needed for wheezing or shortness of breath. 04/19/15  Yes Renato Shin, MD  allopurinol (ZYLOPRIM) 100 MG tablet TAKE 1 TABLET(100 MG) BY MOUTH DAILY 11/27/16  Yes Renato Shin, MD  aspirin EC 81 MG tablet Take 1 tablet (81 mg total) by mouth daily. 03/16/15  Yes Dunn, Dayna N, PA-C  atorvastatin (LIPITOR) 40 MG tablet TAKE 1 TABLET BY MOUTH DAILY AT 6 PM Patient taking differently: Take 40 mg by mouth daily at 6 PM. TAKE 1 TABLET BY MOUTH DAILY AT 6 PM 11/27/16  Yes Renato Shin, MD  clopidogrel  (PLAVIX) 75 MG tablet  Take 1 tablet (75 mg total) by mouth daily. 10/04/16  Yes Dorothy Spark, MD  colestipol (COLESTID) 1 g tablet Take 3 tablets (3 g total) by mouth daily. 02/26/17  Yes Renato Shin, MD  donepezil (ARICEPT) 5 MG tablet TAKE 1 TABLET(5 MG) BY MOUTH AT BEDTIME 11/27/16  Yes Renato Shin, MD  ezetimibe (ZETIA) 10 MG tablet Take 1 tablet (10 mg total) by mouth every morning. 12/20/16  Yes Renato Shin, MD  feeding supplement (GLUCERNA SHAKE) LIQD Take 237 mLs by mouth every morning.    Yes [provider]  Fluticasone-Salmeterol (ADVAIR DISKUS) 100-50 MCG/DOSE AEPB Inhale 1 puff into the lungs 2 (two) times daily. 04/19/15  Yes Renato Shin, MD  gabapentin (NEURONTIN) 100 MG capsule Take 1 capsule (100 mg total) by mouth at bedtime. 12/20/16  Yes Renato Shin, MD  hydrALAZINE (APRESOLINE) 25 MG tablet TAKE 1 TABLET(25 MG) BY MOUTH THREE TIMES DAILY Patient taking differently: Take 25 mg by mouth 3 (three) times daily. TAKE 1 TABLET(25 MG) BY MOUTH THREE TIMES DAILY 12/20/16  Yes Renato Shin, MD  isosorbide mononitrate (IMDUR) 60 MG 24 hr tablet Take 90 mg by mouth daily. 12/20/16  Yes [provider]  latanoprost (XALATAN) 0.005 % ophthalmic solution Place 1 drop into both eyes at bedtime.   Yes [provider]  memantine (NAMENDA) 5 MG tablet Take 1 tablet (5 mg total) by mouth 2 (two) times daily. 12/20/16  Yes Renato Shin, MD  nitroGLYCERIN (NITROSTAT) 0.4 MG SL tablet Place 1 tablet (0.4 mg total) under the tongue every 5 (five) minutes as needed for chest pain. 03/04/17  Yes Dorothy Spark, MD  pantoprazole (PROTONIX) 40 MG tablet TAKE 1 TABLET BY MOUTH TWICE DAILY Patient taking differently: TAKE 40 mg  TABLET BY MOUTH TWICE DAILY 01/14/17  Yes Renato Shin, MD  predniSONE (DELTASONE) 20 MG tablet Take 20 mg by mouth at bedtime. 03/27/17  Yes [provider]  RANEXA 500 MG 12 hr tablet TAKE 1 TABLET BY MOUTH TWICE DAILY Patient taking  differently: TAKE 500 mg TABLET BY MOUTH TWICE DAILY 02/27/17  Yes Dorothy Spark, MD  repaglinide (PRANDIN) 2 MG tablet Take 1 tablet (2 mg total) by mouth 3 (three) times daily before meals. 02/19/17  Yes Renato Shin, MD  sertraline (ZOLOFT) 100 MG tablet TAKE 1 TABLET(100 MG) BY MOUTH DAILY 12/20/16  Yes Renato Shin, MD  insulin NPH-regular Human (NOVOLIN 70/30) (70-30) 100 UNIT/ML injection Inject 5 Units into the skin daily with breakfast. And syringes 1/day 03/20/2017   Renato Shin, MD  isosorbide mononitrate (IMDUR) 30 MG 24 hr tablet Take 3 tablets (90 mg total) by mouth daily. Patient not taking: Reported on 03/30/2017 03/26/17   Renato Shin, MD   Allergies  Allergen Reactions  . Iohexol Anaphylaxis, Itching and Rash     Code: RASH, Desc: [place] ON PT'S CHART ALLERGIC TO IV DYE 09/04/07/RM, Onset Date: [id number]     FAMILY HISTORY:  family history includes Asthma in her other; Cancer - Other in her mother; Diabetes in her mother and sister; Heart disease in her mother; Hypertension in her father and mother; Kidney disease in her paternal grandmother; Lymphoma in her father; Rheum arthritis in her father and mother; Stomach cancer in her mother; Stroke in her paternal grandmother. SOCIAL HISTORY:  reports that she has never smoked. She has never used smokeless tobacco. She reports that she does not drink alcohol or use drugs.  REVIEW OF SYSTEMS:  Unable to  obtain due to clinical condition Constitutional: Negative for fever, chills, weight loss, malaise/fatigue and diaphoresis.  HENT: Negative for hearing loss, ear pain, nosebleeds, congestion, sore throat, neck pain, tinnitus and ear discharge.   Eyes: Negative for blurred vision, double vision, photophobia, pain, discharge and redness.  Respiratory: Negative for cough, hemoptysis, sputum production, shortness of breath, wheezing and stridor.   Cardiovascular: Negative for chest pain, palpitations, orthopnea, claudication,  leg swelling and PND.  Gastrointestinal: Negative for heartburn, nausea, vomiting, abdominal pain, diarrhea, constipation, blood in stool and melena.  Genitourinary: Negative for dysuria, urgency, frequency, hematuria and flank pain.  Musculoskeletal: Negative for myalgias, back pain, joint pain and falls.  Skin: Negative for itching and rash.  Neurological: Negative for dizziness, tingling, tremors, sensory change, speech change, focal weakness, seizures, loss of consciousness, weakness and headaches.  Endo/Heme/Allergies: Negative for environmental allergies and polydipsia. Does not bruise/bleed easily.  SUBJECTIVE:   VITAL SIGNS: Temp:  [97.6 F (36.4 C)-97.8 F (36.6 C)] 97.8 F (36.6 C) (10/14 0800) Pulse Rate:  [53-75] 53 (10/14 1650) Resp:  [14-25] 14 (10/14 1650) BP: (101-120)/(51-71) 101/68 (10/14 1650) SpO2:  [95 %-100 %] 95 % (10/14 1650) FiO2 (%):  [24 %-100 %] 100 % (10/14 1951)  PHYSICAL EXAMINATION: General: alert not following commands intubated Neuro:  GCS 9, no focal deficit appreciated EOM intact HEENT: intubated normocephalic Cardiovascular: S1 S2 irregularly irregular HR Lungs: coarse breath sounds bilaterally Abdomen: soft + BS Musculoskeletal:  No deformity noted Skin: small pressure ulcer (prev documented)   Recent Labs Lab 04/14/2017 2319 03/30/17 0406 03/30/17 0711  NA 133* 134* 133*  K 4.2 4.3 4.1  CL 97* 96* 96*  CO2 19* 20* 18*  BUN 44* 44* 46*  CREATININE 5.44* 5.76* 5.79*  GLUCOSE 174* 193* 158*    Recent Labs Lab 03/20/2017 1012 03/30/17 1931  HGB 12.4 12.0  HCT 37.7 37.6  WBC 13.5* 21.8*  PLT 136* PENDING   Ct Abdomen Pelvis Wo Contrast  Result Date: 04/04/2017 CLINICAL DATA:  Lower abdominal pain, nausea/vomiting/diarrhea EXAM: CT ABDOMEN AND PELVIS WITHOUT CONTRAST TECHNIQUE: Multidetector CT imaging of the abdomen and pelvis was performed following the standard protocol without IV contrast. COMPARISON:  11/14/2016 FINDINGS:  Lower chest: Lung bases are clear. Cardiomegaly. Coronary atherosclerosis. Mitral valve annular calcifications. Hepatobiliary: Unenhanced liver is unremarkable. Status post cholecystectomy. No intrahepatic extrahepatic ductal dilatation. Pancreas: Within normal limits. Spleen: Within normal limits. Adrenals/Urinary Tract: Adrenal glands are within normal limits. Bilateral renal cortical atrophy. No renal calculi or hydronephrosis. Bladder is thick-walled. Associated hyperdense fluid in the bladder is presumably related to prior contrast administration (prior CT, cardiovascular, or interventional procedure) in a patient who is relatively anuric. Stomach/Bowel: Step stomach is within normal limits. No evidence of bowel obstruction. Appendix is not discretely visualized. Sigmoid diverticulosis, without evidence of diverticulitis. Vascular/Lymphatic: No evidence of abdominal aortic aneurysm. Atherosclerotic calcifications of the abdominal aorta and branch vessels. No suspicious abdominopelvic lymphadenopathy. Reproductive: Uterus is heterogeneous with small calcified uterine fibroids. No adnexal masses. Other: No abdominopelvic ascites. Musculoskeletal: Mild degenerative changes of the visualized thoracolumbar spine. Healed fracture of the right iliac wing. IMPRESSION: No evidence of bowel obstruction. Sigmoid diverticulosis, without evidence of diverticulitis. No CT findings to account for the patient's lower abdominal pain. Additional ancillary findings as above. Electronically Signed   By: Julian Hy M.D.   On: 04/07/2017 13:38   Dg Chest 2 View  Result Date: 03/30/2017 CLINICAL DATA:  Shortness of breath. EXAM: CHEST  2 VIEW COMPARISON:  Radiographs  of March 08, 2016. FINDINGS: Stable cardiomegaly with mild central pulmonary vascular congestion. Atherosclerosis of thoracic aorta is noted. No pneumothorax is noted. Hypoinflation of the lungs is noted with mild bibasilar subsegmental atelectasis. Bony  thorax is unremarkable. IMPRESSION: Aortic atherosclerosis. Stable cardiomegaly with mild central pulmonary vascular congestion. Hypoinflation of the lungs with mild bibasilar subsegmental atelectasis. Electronically Signed   By: Marijo Conception, M.D.   On: 03/30/2017 07:37   Dg Chest Port 1 View  Addendum Date: 03/30/2017   ADDENDUM REPORT: 03/30/2017 19:43 ADDENDUM: These results were called by telephone at the time of interpretation on 03/30/2017 at 7:33 pm to Dr. Salvadore Dom , who verbally acknowledged these results. Electronically Signed   By: Fidela Salisbury M.D.   On: 03/30/2017 19:43   Result Date: 03/30/2017 CLINICAL DATA:  Respiratory failure post intubation. EXAM: PORTABLE CHEST 1 VIEW COMPARISON:  03/30/2017 FINDINGS: Post intubation. Endotracheal tube terminates at the carina. Readjustment is recommended. Cardiomediastinal silhouette is enlarged. Prominent calcific atherosclerotic disease of the aorta. No evidence of pneumothorax. Worsening of the aeration of the lungs with bilateral patchy alveolar in interstitial opacities. Possible small subpulmonic effusions. IMPRESSION: Endotracheal tube terminates at the carina. Retraction by 3 cm is recommended. Significant worsening of the appearance of the lungs with bilateral diffuse patchy airspace opacities which may represent mixed pattern edema or multifocal pneumonia. Electronically Signed: By: Fidela Salisbury M.D. On: 03/30/2017 19:26   US Abdomen Limited Ruq  Result Date: 03/30/2017 CLINICAL DATA:  Abdominal pain, history of prior cholecystectomy EXAM: ULTRASOUND ABDOMEN LIMITED RIGHT UPPER QUADRANT COMPARISON:  04/06/2017 FINDINGS: Gallbladder: Surgically removed Common bile duct: Diameter: 4.6 mm. Liver: No focal lesion identified. Within normal limits in parenchymal echogenicity. Portal vein is patent on color Doppler imaging with normal direction of blood flow towards the liver. IMPRESSION: Status post cholecystectomy.  No  other focal abnormality is noted. Electronically Signed   By: Inez Catalina M.D.   On: 03/30/2017 16:32    ASSESSMENT / PLAN: NEURO: H/o Dementia currently intubated and sedated GCS 9 prior to sedation On initial exam awake and pulling at ETT Started on soft wrist restraints Started on continuous Fentanyl with intermittent Versed RASS goal -1 No history of seizures Last [facility] was in July 2014 and demonstrated moderate cerebral and cerebellar atrophy with chronic microvascular ischemic disease. Previously on Aricept and Namenda for Dementia- held at this time.  CARDIAC: S/p CODE BLUE- 18 mins  Was RRT Bradycardic and Hypotensive Pt received atropine, calcium, epi and bicarb and was started on vasopressor Levophed ggt - MAP goal 60 or greater Afib RVR CHADS2VASC 7 previously on Cardizem and Heparin ggt- held at this time HR remains <120  Combined chronic systolic and diastolic heart failure  ECHO performed 03/30/17 Results are pending  Previous ECHO in May showed EF 60-65% CAD - continues on Plavix  PULMONARY: H/o COPD Intubated on PRVC TV 450 RR 16 PEEP 6 FiO2 100 Initial ABG ph 6.9/36/112/8 - Severe AG Metabolic Acidosis with respiratory alkalosis Increased minute ventilation to compensate CXR post intubation was at carina RT to pull it back by 3 cm and CXR repeated Repeat ABG in 2 hrs  ID: Blood cultures done recently on 10/13- no growth to date MRSA PCR is pending  Endocrine: TSH- 1.058 IDDM - admitted in DKA Last AG 19  On D5 1/2 NS Started on stress dose steroids ICU Glycemic protocol  GI: NPO PPI for prophylaxis  No OGT inserted  Heme: Hemoglobin 12.4 Platelets 136 No  signs of active bleeding or coagulopathy On Heparin ggt for Afib RVR with elevated CHADS2VASC Held heparin at this time post compressions and now that HR is controlled Hgb<7 transfuse PRBCs DVT PPx-> held due to heparin ggt SCDs  RENAL ESRD on HD TTS Missed Saturday dialysis Will  continue IVF Continue to hold hydralazine and metoprolol Baseline Cr- noted .Marland Kitchen Lab Results  Component Value Date   CREATININE 5.79 (H) 03/30/2017   CREATININE 5.76 (H) 03/30/2017   CREATININE 5.44 (H) 03/17/2017   Anion Gap Metabolic Acidosis Received 2 amps of bicarb and started on Bicarb ggt Previously on insulin ggt Repeat CMP pending  If AG+-> will restart DKA protocol    I, Dr Seward Carol have personally reviewed patient's available data, including medical history, events of note, physical examination and test results as part of my evaluation. I have discussed with nurse and other care providers such as pharmacist, RT and RRT. The patient is critically ill with multiple organ systems failure and requires high complexity decision making for assessment and support, frequent evaluation and titration of therapies, application of advanced monitoring technologies and extensive interpretation of multiple databases. Critical Care Time devoted to patient care services described in this note is 90 Minutes . This time reflects time of care of this signee Dr Seward Carol. This critical care time does not reflect procedure time, or teaching time or supervisory time of PA/NP/Med student/Med Resident etc but could involve care discussion time   DISPOSITION: ICU CC TIME: 90 mins PROGNOSIS: Poor FAMILY: At bedside. Husband makes decisions regarding care 2 daughters and a son also present  FAMILY DISCUSSION I discussed at length with the husband and daughter regarding patient's clinical status and her prognosis. Her husband expressed that he did not want her to suffer any longer and he did not desire for her to have any surgical procedures such as central lines etc. We agreed to continue current interventions until the remainder of family arrives. Then have a conversation to explain to them and at that time withdraw care.  Signed Dr Seward Carol Pulmonary Critical Care  [facility] [place] Pager: [phone]  03/30/2017, 8:13 PM

## 2017-03-30 NOTE — Progress Notes (Signed)
Pt has a 3 bts of non -sustained v-tach. No sign or symptoms of distress, we'll continue to monitor.

## 2017-03-30 NOTE — Consult Note (Signed)
Cardiology Consultation:   Patient ID: KEIDY THURGOOD; [id number]; 03-12-1930   Admit date: 03/18/2017 Date of Consult: 03/30/2017  Primary Care Provider: Renato Shin, MD Primary Cardiologist: Dr. Ena Dawley    Patient Profile:   [name] is a 83 y.o. female with a hx of diastolic congestive heart failure, non-obstructive coronary artery disease, diabetes, hypertension, hyperlipidemia, obstructive sleep apnea, end-stage renal disease on hemodialysis, peripheral arterial disease status post left BKA in 3/14, dementia who is being seen today for the evaluation of atrial fibrillation with RVR at the request of Dr. Denton Brick.  History of Present Illness:   [name] was admitted yesterday by the internal medicine team.  She presented with nausea, vomiting, diarrhea and diabetic ketoacidosis, questionable sepsis.  She was found to be in atrial fibrillation with rapid ventricular rate.  She was placed on Diltiazem IV for rate control.  Troponin is noted to be elevated.  However, she has had persistently elevated Troponin levels in the past.  Her last Cardiac Catheterization in 02/2015 demonstrated stable anatomy with mod non-obstructive disease in the RCA and LCx.  She did have some chest pain with associated palpitations several days prior to admission.  She has noted paroxysmal nocturnal dyspnea. She is able to finish her dialysis sessions.  She denies syncope or lower extremity edema.  She is currently comfortable.  She complains of some R shoulder pain.   CHADS2-VASc=7 (CHF, HTN, DM, age, gender, CAD)   Past Medical History:  Diagnosis Date  . Arthritis   . Asthma    Mild  . Bradycardia    a. Not on BB due to this.  Marland Kitchen CAD (coronary artery disease)    a. s/p multiple caths with nonobs CAD;   b. cath 1/10: pLAD 20%, mLAD 40%, pCFX 20%, mCFX 40%, pRCA 60-70%;   c.  Myoview 06/02/12: Low anterior wall scar, no ischemia, EF 37%. d. cath 06/20/2014 70% mid LCx, medical  therapy, high risk for PCI due to close proximity to large OM e. cath 03/07/15 pro RCA 50% & pro to mid Cx 75%, both stable, LVEDP 33-14m Hg-> medical management  . Cardiomyopathy ([place]    a. Echo 05/31/12: EF 40-45%. b. Echo 06/2014: EF 50-55%. c. Cath 02/2015: EF 45-50%.  . Chronic combined systolic and diastolic CHF (congestive heart failure) (HCC)    a. EF 50-55%, mild LVH and grade 1 diast. Dysfxn b. Grade 3 Diastolic Dysfunction 013/0865 b. 02/2015: EF 45-50% by cath.  . CKD (chronic kidney disease), stage III ([facility]    Dr. DLorrene Reid    dialyzes Tuesday, Thursday, Saturday  . COPD (chronic obstructive pulmonary disease) ([place]   . Depression   . Diabetes mellitus    type II; peripheral neuropathy  . Diverticulosis   . DM retinopathy ([place]   . Dyslipidemia   . GERD (gastroesophageal reflux disease)   . Hiatal hernia   . History of shingles   . Hypertension   . Noncompliance   . Obesity   . Osteoarthritis cervical spine   . Peptic stricture of esophagus   . Peptic ulcer disease    duodenal  . Peripheral vascular disease ([place]    a. s/p L BKA 08/2012.  .Marland KitchenPruritic condition    Idiopathic  . Shortness of breath dyspnea   . Sleep apnea    with CPAP  . Uterine cancer ([facility]   . Zoster     Past Surgical History:  Procedure Laterality Date  . ABDOMINAL ANGIOGRAM N/A 08/31/2012  Procedure: ABDOMINAL ANGIOGRAM with runoff poss intervention;  Surgeon: Angelia Mould, MD;  [facility] CATH LAB;  Service: Cardiovascular;  Laterality: N/A;  . AMPUTATION Left 09/04/2012   Procedure: AMPUTATION BELOW KNEE;  Surgeon: Angelia Mould, MD;  Location: [place];  Service: Vascular;  Laterality: Left;  . AV FISTULA PLACEMENT Left 05/10/2015   Procedure: CREATION OF LEFT UPPER ARM ARTERIOVENOUS (AV) FISTULA ;  Surgeon: Elam Dutch, MD;  Location: [place];  Service: Vascular;  Laterality: Left;  . BRAIN SURGERY     1997 blood clot on the brain, then had to relieve fluid on the brain  .  CARDIAC CATHETERIZATION    . CARDIAC CATHETERIZATION N/A 03/07/2015   Procedure: Left Heart Cath and Coronary Angiography;  Surgeon: Leonie Man, MD;  Location: [place] CV LAB;  Service: Cardiovascular;  Laterality: N/A;  . CATARACT EXTRACTION, BILATERAL  2005  . CHOLECYSTECTOMY  2010  . CRANIOTOMY  1997   Left for SDH  . DEXA  7/05  . ESOPHAGOGASTRODUODENOSCOPY  04/04/2004  . EYE SURGERY    . HERNIA REPAIR    . KNEE ARTHROSCOPY  10/1998   Left  . LEFT HEART CATHETERIZATION WITH CORONARY ANGIOGRAM N/A 06/22/2014   Procedure: LEFT HEART CATHETERIZATION WITH CORONARY ANGIOGRAM;  Surgeon: Burnell Blanks, MD;  Location: [facility] C[facility] LAB;  Service: Cardiovascular;  Laterality: N/A;  . MULTIPLE EXTRACTIONS WITH ALVEOLOPLASTY N/A 08/01/2014   Procedure: EXTRACTIONS OF TEETH NUMBERS _0 AND 19 AND ALVEOLOPLASTY UPPER LEFT AND RIGHT QUADRANT;  Surgeon: Isac Caddy, DDS;  Location: [place];  Service: Oral Surgery;  Laterality: N/A;  . SPINE SURGERY     C-spine and lumbar surgery  . TUBAL LIGATION  1967  . TUBAL LIGATION       Home Medications:  Prior to Admission medications   Medication Sig Start Date End Date Taking? Authorizing Provider  acetaminophen (TYLENOL) 500 MG tablet Take 500 mg by mouth every 6 (six) hours as needed for mild pain.   Yes [provider]  albuterol (PROVENTIL HFA;VENTOLIN HFA) 108 (90 BASE) MCG/ACT inhaler Inhale 2 puffs into the lungs every 4 (four) hours as needed for wheezing or shortness of breath. 04/19/15  Yes Renato Shin, MD  allopurinol (ZYLOPRIM) 100 MG tablet TAKE 1 TABLET(100 MG) BY MOUTH DAILY 11/27/16  Yes Renato Shin, MD  aspirin EC 81 MG tablet Take 1 tablet (81 mg total) by mouth daily. 03/16/15  Yes Dunn, Dayna N, PA-C  atorvastatin (LIPITOR) 40 MG tablet TAKE 1 TABLET BY MOUTH DAILY AT 6 PM Patient taking differently: Take 40 mg by mouth daily at 6 PM. TAKE 1 TABLET BY MOUTH DAILY AT 6 PM 11/27/16  Yes Renato Shin, MD    clopidogrel (PLAVIX) 75 MG tablet Take 1 tablet (75 mg total) by mouth daily. 10/04/16  Yes Dorothy Spark, MD  colestipol (COLESTID) 1 g tablet Take 3 tablets (3 g total) by mouth daily. 02/26/17  Yes Renato Shin, MD  donepezil (ARICEPT) 5 MG tablet TAKE 1 TABLET(5 MG) BY MOUTH AT BEDTIME 11/27/16  Yes Renato Shin, MD  ezetimibe (ZETIA) 10 MG tablet Take 1 tablet (10 mg total) by mouth every morning. 12/20/16  Yes Renato Shin, MD  feeding supplement (GLUCERNA SHAKE) LIQD Take 237 mLs by mouth every morning.    Yes [provider]  Fluticasone-Salmeterol (ADVAIR DISKUS) 100-50 MCG/DOSE AEPB Inhale 1 puff into the lungs 2 (two) times daily. 04/19/15  Yes Loanne Drilling,  Hilliard Clark, MD  gabapentin (NEURONTIN) 100 MG capsule Take 1 capsule (100 mg total) by mouth at bedtime. 12/20/16  Yes Renato Shin, MD  hydrALAZINE (APRESOLINE) 25 MG tablet TAKE 1 TABLET(25 MG) BY MOUTH THREE TIMES DAILY Patient taking differently: Take 25 mg by mouth 3 (three) times daily. TAKE 1 TABLET(25 MG) BY MOUTH THREE TIMES DAILY 12/20/16  Yes Renato Shin, MD  isosorbide mononitrate (IMDUR) 60 MG 24 hr tablet Take 90 mg by mouth daily. 12/20/16  Yes [provider]  latanoprost (XALATAN) 0.005 % ophthalmic solution Place 1 drop into both eyes at bedtime.   Yes [provider]  memantine (NAMENDA) 5 MG tablet Take 1 tablet (5 mg total) by mouth 2 (two) times daily. 12/20/16  Yes Renato Shin, MD  nitroGLYCERIN (NITROSTAT) 0.4 MG SL tablet Place 1 tablet (0.4 mg total) under the tongue every 5 (five) minutes as needed for chest pain. 03/04/17  Yes Dorothy Spark, MD  pantoprazole (PROTONIX) 40 MG tablet TAKE 1 TABLET BY MOUTH TWICE DAILY Patient taking differently: TAKE 40 mg  TABLET BY MOUTH TWICE DAILY 01/14/17  Yes Renato Shin, MD  predniSONE (DELTASONE) 20 MG tablet Take 20 mg by mouth at bedtime. 03/27/17  Yes [provider]  RANEXA 500 MG 12 hr tablet TAKE 1 TABLET BY MOUTH TWICE  DAILY Patient taking differently: TAKE 500 mg TABLET BY MOUTH TWICE DAILY 02/27/17  Yes Dorothy Spark, MD  repaglinide (PRANDIN) 2 MG tablet Take 1 tablet (2 mg total) by mouth 3 (three) times daily before meals. 02/19/17  Yes Renato Shin, MD  sertraline (ZOLOFT) 100 MG tablet TAKE 1 TABLET(100 MG) BY MOUTH DAILY 12/20/16  Yes Renato Shin, MD  insulin NPH-regular Human (NOVOLIN 70/30) (70-30) 100 UNIT/ML injection Inject 5 Units into the skin daily with breakfast. And syringes 1/day 03/20/2017   Renato Shin, MD  isosorbide mononitrate (IMDUR) 30 MG 24 hr tablet Take 3 tablets (90 mg total) by mouth daily. Patient not taking: Reported on 03/20/2017 03/26/17   Renato Shin, MD    Inpatient Medications: Scheduled Meds: . clopidogrel  75 mg Oral Daily  . ezetimibe  10 mg Oral q morning - 10a  . heparin  5,000 Units Subcutaneous Q8H  . insulin aspart  0-5 Units Subcutaneous QHS  . insulin aspart  0-9 Units Subcutaneous TID WC  . insulin detemir  15 Units Subcutaneous Daily  . latanoprost  1 drop Both Eyes QHS  . memantine  5 mg Oral BID  . metoprolol tartrate  25 mg Oral BID  . mometasone-formoterol  2 puff Inhalation BID  . pantoprazole  40 mg Oral BID  . predniSONE  20 mg Oral QHS   Continuous Infusions: . sodium chloride Stopped ( 2258)  . dextrose 5 % and 0.45% NaCl 50 mL/hr at 04/10/2017 2002  . diltiazem (CARDIZEM) infusion 10 mg/hr (03/30/17 0734)   PRN Meds: acetaminophen, albuterol, diphenhydrAMINE  Allergies:    Allergies  Allergen Reactions  . Iohexol Anaphylaxis, Itching and Rash     Code: RASH, Desc: [place] ON PT'S CHART ALLERGIC TO IV DYE 09/04/07/RM, Onset Date: [id number]     Social History:   Social History   Social History  . Marital status: Married    Spouse name: N/A  . Number of children: 7  . Years of education: N/A   Occupational History  . Retired Retired   Social History Main Topics  . Smoking status: Never Smoker  . Smokeless  tobacco: Never Used  .  Alcohol use No     Comment: rare  . Drug use: No  . Sexual activity: No   Other Topics Concern  . Not on file   Social History Narrative  . No narrative on file    Family History:    Family History  Problem Relation Age of Onset  . Cancer - Other Mother        "Stomach" Cancer  . Diabetes Mother   . Heart disease Mother   . Stomach cancer Mother   . Hypertension Mother   . Rheum arthritis Mother   . Lymphoma Father   . Hypertension Father   . Rheum arthritis Father   . Kidney disease Paternal Grandmother   . Stroke Paternal Grandmother   . Asthma Other   . Diabetes Sister   . Heart attack Neg Hx      ROS:  Please see the history of present illness.  ROS  She denies melena, hematochezia, hematemesis. All other ROS reviewed and negative.     Physical Exam/Data:   Vitals:   03/30/17 0400 03/30/17 0800 03/30/17 0946 03/30/17 0949  BP:  120/71    Pulse:  70 70   Resp:  17 17   Temp: 97.6 F (36.4 C) 97.8 F (36.6 C)    TempSrc: Oral Oral    SpO2: 100% 100% 100% 100%  Weight:      Height:        Intake/Output Summary (Last 24 hours) at 03/30/17 1050 Last data filed at 03/30/17 0359  Gross per 24 hour  Intake          1999.27 ml  Output                0 ml  Net          1999.27 ml   Filed Weights   04/10/2017 1002  Weight: 136 lb (61.7 kg)   Body mass index is 21.95 kg/m.  General:  Well nourished, well developed, in no acute distress  HEENT: normal Lymph: no adenopathy Neck: JVP difficult to assess; ext Jugular up to jaw line Endocrine:  No thryomegaly Cardiac:  normal S1, S2; irreg irreg rhythm; no murmur   Lungs:  Decreased breath sounds bilat; no obvious rales  Abd: soft  Ext: no edema in R leg Musculoskeletal:  L BKA noted Skin: warm and dry  Neuro:  CNs 2-12 intact  Psych:  Normal affect   EKG:  The EKG was personally reviewed and demonstrates: 03/24/2017 demonstrates atrial fibrillation, HR 138, interventricular  conduction delay, inferolateral T wave inversions  Telemetry:  Telemetry was personally reviewed and demonstrates: H fibrillation, HR 60-70s, one 3 beat run of nonsustained ventricular tachycardia  Relevant CV Studies:  Nuclear stress test 03/13/16 No ischemia or scar, EF 38  Echocardiogram 10/19/15 Moderate LVH, EF 69-48, grade 2 diastolic dysfunction, severe MAC, mild LAE, PASP 46  Cardiac catheterization 03/07/15 LAD proximal to mid 20-30 LCx proximal 75 (unchanged from prior cath) RCA proximal 50 (unchanged from prior cath) EF 45-50 LVEDP 33-38  Echocardiogram 06/23/14 Moderate concentric LVH, EF 50-55, normal wall motion, grade 3 diastolic dysfunction, moderate MAC, mild LAE   Laboratory Data:  Chemistry  Recent Labs Lab  2319 03/30/17 0406 03/30/17 0711  NA 133* 134* 133*  K 4.2 4.3 4.1  CL 97* 96* 96*  CO2 19* 20* 18*  GLUCOSE 174* 193* 158*  BUN 44* 44* 46*  CREATININE 5.44* 5.76* 5.79*  CALCIUM 8.0* 8.0* 8.0*  GFRNONAA 6*  6* 6*  GFRAA 7* 7* 7*  ANIONGAP 17* 18* 19*     Recent Labs Lab 04/07/2017 1012  PROT 6.7  ALBUMIN 3.9  AST 113*  ALT 83*  ALKPHOS 121  BILITOT 1.2   Hematology  Recent Labs Lab 03/18/2017 1012  WBC 13.5*  RBC 4.24  HGB 12.4  HCT 37.7  MCV 88.9  MCH 29.2  MCHC 32.9  RDW 16.1*  PLT 136*   Cardiac EnzymesNo results for input(s): TROPONINI in the last 168 hours.   Recent Labs Lab 04/15/2017 1109  TROPIPOC 0.36*    BNPNo results for input(s): BNP, PROBNP in the last 168 hours.  DDimer No results for input(s): DDIMER in the last 168 hours.  Radiology/Studies:  Ct Abdomen Pelvis Wo Contrast  Result Date: 04/05/2017 CLINICAL DATA:  Lower abdominal pain, nausea/vomiting/diarrhea EXAM: CT ABDOMEN AND PELVIS WITHOUT CONTRAST TECHNIQUE: Multidetector CT imaging of the abdomen and pelvis was performed following the standard protocol without IV contrast. COMPARISON:  11/14/2016 FINDINGS: Lower chest: Lung bases are clear.  Cardiomegaly. Coronary atherosclerosis. Mitral valve annular calcifications. Hepatobiliary: Unenhanced liver is unremarkable. Status post cholecystectomy. No intrahepatic extrahepatic ductal dilatation. Pancreas: Within normal limits. Spleen: Within normal limits. Adrenals/Urinary Tract: Adrenal glands are within normal limits. Bilateral renal cortical atrophy. No renal calculi or hydronephrosis. Bladder is thick-walled. Associated hyperdense fluid in the bladder is presumably related to prior contrast administration (prior CT, cardiovascular, or interventional procedure) in a patient who is relatively anuric. Stomach/Bowel: Step stomach is within normal limits. No evidence of bowel obstruction. Appendix is not discretely visualized. Sigmoid diverticulosis, without evidence of diverticulitis. Vascular/Lymphatic: No evidence of abdominal aortic aneurysm. Atherosclerotic calcifications of the abdominal aorta and branch vessels. No suspicious abdominopelvic lymphadenopathy. Reproductive: Uterus is heterogeneous with small calcified uterine fibroids. No adnexal masses. Other: No abdominopelvic ascites. Musculoskeletal: Mild degenerative changes of the visualized thoracolumbar spine. Healed fracture of the right iliac wing. IMPRESSION: No evidence of bowel obstruction. Sigmoid diverticulosis, without evidence of diverticulitis. No CT findings to account for the patient's lower abdominal pain. Additional ancillary findings as above. Electronically Signed   By: Julian Hy M.D.   On: 04/05/2017 13:38   Dg Chest 2 View  Result Date: 03/30/2017 CLINICAL DATA:  Shortness of breath. EXAM: CHEST  2 VIEW COMPARISON:  Radiographs of March 08, 2016. FINDINGS: Stable cardiomegaly with mild central pulmonary vascular congestion. Atherosclerosis of thoracic aorta is noted. No pneumothorax is noted. Hypoinflation of the lungs is noted with mild bibasilar subsegmental atelectasis. Bony thorax is unremarkable. IMPRESSION:  Aortic atherosclerosis. Stable cardiomegaly with mild central pulmonary vascular congestion. Hypoinflation of the lungs with mild bibasilar subsegmental atelectasis. Electronically Signed   By: Marijo Conception, M.D.   On: 03/30/2017 07:37    Assessment and Plan:   1.  Persistent atrial fibrillation CHADS2-VASc=7.  She would benefit from long term anticoagulation.  Her atrial fibrillation may have occurred in the setting of acute illness.  She has noted chest discomfort and palpitations for several days prior to admission.  It is possible that she has been in atrial fibrillation for several days.  Her rate is now controlled.  She has a history of craniotomy for subdural hematoma in 1997.  We will have to determine if she is a candidate for long-term anticoagulation.  In the short-term, we may want to fully anticoagulate her with IV heparin and Coumadin with plans for cardioversion.  If we decide she is not a good candidate for long term anticoagulation, we could place  her on Amiodarone to keep her in normal sinus rhythm.  Continue IV Diltiazem for now.  Attending MD to follow.    2.  Diabetic ketoacidosis Per primary team  3.  Chronic diastolic heart failure Volume appears stable. Volume management per nephrology.  4.  End-stage renal disease on hemodialysis Per nephrology.  5.  Coronary artery disease Initial troponin elevated.  She has had a history of persistently elevated troponin levels.  I suspect her troponin may go up in relation to demand ischemia.  Nuclear stress test in 9/17 demonstrated no scar or ischemia and she had stable anatomy on catheterization 9/16.  She did have chest pain recently.  Question if this was related to rapid rate.  Obtain echocardiogram.  Continue to cycle enzymes.  If she has new wall motion abnormalities or her troponins continue to increase consistent with non-ST elevation myocardial infarction, we may need to consider cardiac catheterization.  For questions or  updates, please contact [place] Please consult [email or web address] for contact info under Cardiology/STEMI.   Signed, Richardson Dopp, PA-C  03/30/2017 10:50 AM

## 2017-03-30 NOTE — Progress Notes (Signed)
Pt intubated with a 3 MAC with a 7.5 ET tube at 24. Positive ETC02  color change & condensation in ET tube. Kal M RRT confirmed breath sounds. CRNA attached Capnography unable to obtain. CCM Marni Griffon checked tube placement with a glidascope & ET was not in correct position. Marni Griffon reintubated with a 7.5 @ 24 with a glidascope. Positive color change, breath sounds, capnography, & visualized with glidascope. Xray confirmed placement.

## 2017-03-30 NOTE — Progress Notes (Signed)
Patient ID: [name], female   DOB: August 21, 1929, 83 y.o.   MRN: [id number] Patient had cardiorespiratory arrest for which CODE BLUE was called and CPR was started and she was intubated. Currently patient has a pulse. Patient is about to be transferred to ICU. I spoken with Dr. Mannam/CCM about the patient. Patient is critically ill. She is very hypertensive and Levophed drip is being started. I spoke to patient's husband and daughter extensively about the patient's current critically ill status. I also explained to them that patient might not survive throughout the night and might lose pulse again. Because of overall  poor prognosis, the husband and daughter confirmed that they wanted to change the patient's CODE STATUS to DO NOT RESUSCITATE. If the patient would lose pulse again, they would not like to resume CPR. Her CODE STATUS will be changed to DO NOT RESUSCITATE. Overall prognosis is very poor. Patient is about to be transferred to ICU and further management will be as per ICU team.

## 2017-03-30 NOTE — Progress Notes (Signed)
Patient ID: [name], female   DOB: 07-23-1929, 83 y.o.   MRN: [id number]  PROGRESS NOTE    TENLEE WOLLIN  IPJ:[id number] DOB: 1929-10-07 DOA: 03/30/2017 PCP: Renato Shin, MD   Brief Narrative: 83 year old female with history of end-stage renal disease on dialysis, coronary artery disease, combined chronic systolic and diastolic heart failure, dyslipidemia, hypertension, diabetes mellitus on oral agent, diverticulosis, peripheral vascular disease status post left BKA in 2014, COPD and bradycardia presented with abdominal pain with nausea and vomiting. She was found to be in new onset atrial fibrillation with rapid ventricular rate along with DKA with leukocytosis and mildly positive troponins. She was started on insulin drip and Cardizem drip.   Assessment & Plan:   Principal Problem:   Atrial fibrillation with RVR (HCC) Active Problems:   DKA (diabetic ketoacidoses) (HCC)   CKD (chronic kidney disease), stage V on HD   CAD (coronary artery disease)   Asthma   Dyslipidemia   Hypertension   ? Sepsis ([place])    New onset Atrial fibrillation with RVR  -probably triggered by DKA  -CHADVASC=7 - currently on Cardizem drip. Rate better controlled. Will start metoprolol orally and titrate Cardizem drip to off. -cardiology consulted    DKA (diabetic ketoacidoses)  -currently on insulin drip. Blood sugars much better. Will transition to long-acting insulin and subsequently turned the insulin drip off. Continue Accu-Cheks with coverage. Start clear liquid diet and advanced to carb modified renal diet as tolerated. -HgbA1c was 7.5 on 02/19/17  Doubt that the patient had sepsis - patient's presentation and hemodynamics was probably secondary to DKA. Will hold off on antibiotics unless patient starts spiking temperatures. Follow cultures  End-stage renal disease on hemodialysis - Nephrology consult appreciated. Dialysis as per nephrology scheduled    CAD (coronary artery  disease) - with chronically elevated troponins. We will recycle troponins. - Continue Plavix. - Follow cardiology recommendations. Follow 2-D echo. Currently chest pain-free    Hypertension -blood pressure stable. Monitor. Antihypertensives are on hold for now    Asthma -Continue prednisone, Advair and albuterol    Dyslipidemia -resume Zetia and lipitor    Dementia -resume Aricept and Namenda    DVT prophylaxis: heparin Code Status:  full Family Communication: none at bedside Disposition Plan:home in 2-3 days once condition improves  Consultants: cardiology  Procedures: Echo pending  Antimicrobials: none    Subjective: Patient seen and examined at bedside. She feels slightly better. No current chest pain, nausea or vomiting. No overnight fever.  Objective: Vitals:   03/30/17 0400 03/30/17 0800 03/30/17 0946 03/30/17 0949  BP:  120/71    Pulse:  70 70   Resp:  17 17   Temp: 97.6 F (36.4 C) 97.8 F (36.6 C)    TempSrc: Oral Oral    SpO2: 100% 100% 100% 100%  Weight:      Height:        Intake/Output Summary (Last 24 hours) at 03/30/17 1008 Last data filed at 03/30/17 0359  Gross per 24 hour  Intake          1999.27 ml  Output                0 ml  Net          1999.27 ml   Filed Weights   03/25/2017 1002  Weight: 61.7 kg (136 lb)    Examination:  General exam: Appears calm and comfortable  Respiratory system: Bilateral decreased breath sound at bases with scattered crackles Cardiovascular  system: S1 & S2 heard,rate controlled.  Gastrointestinal system: Abdomen is nondistended, soft and mildly tender in the epigastric region. Normal bowel sounds heard. Central nervous system: Alert and oriented. No focal neurological deficits. Moving extremities Extremities: No cyanosis, clubbing, trace edema. Left BKA Skin: No rashes, lesions or ulcers Lymph: no cervical lymphadenopathy    Data Reviewed: I have personally reviewed following labs and imaging  studies  CBC:  Recent Labs Lab 04/16/2017 1012  WBC 13.5*  NEUTROABS 11.0*  HGB 12.4  HCT 37.7  MCV 88.9  PLT 161*   Basic Metabolic Panel:  Recent Labs Lab 03/19/2017 1012 04/09/2017 1106 03/28/2017 1929 03/28/2017 2319 03/30/17 0406 03/30/17 0711  NA 128*  --  135 133* 134* 133*  K 4.4  --  3.6 4.2 4.3 4.1  CL 89*  --  98* 97* 96* 96*  CO2 20*  --  21* 19* 20* 18*  GLUCOSE 407*  --  181* 174* 193* 158*  BUN 39*  --  43* 44* 44* 46*  CREATININE 5.48*  --  5.33* 5.44* 5.76* 5.79*  CALCIUM 8.5*  --  8.0* 8.0* 8.0* 8.0*  MG  --  2.3  --   --   --   --    GFR: Estimated Creatinine Clearance: 6.7 mL/min (A) (by C-G formula based on SCr of 5.79 mg/dL (H)). Liver Function Tests:  Recent Labs Lab 04/04/2017 1012  AST 113*  ALT 83*  ALKPHOS 121  BILITOT 1.2  PROT 6.7  ALBUMIN 3.9    Recent Labs Lab 03/22/2017 1012  LIPASE 30   No results for input(s): AMMONIA in the last 168 hours. Coagulation Profile: No results for input(s): INR, PROTIME in the last 168 hours. Cardiac Enzymes: No results for input(s): CKTOTAL, CKMB, CKMBINDEX, TROPONINI in the last 168 hours. BNP (last 3 results) No results for input(s): PROBNP in the last 8760 hours. HbA1C: No results for input(s): HGBA1C in the last 72 hours. CBG:  Recent Labs Lab 03/30/17 0519 03/30/17 0622 03/30/17 0740 03/30/17 0849 03/30/17 0959  GLUCAP 182* 187* 127* 107* 117*   Lipid Profile: No results for input(s): CHOL, HDL, LDLCALC, TRIG, CHOLHDL, LDLDIRECT in the last 72 hours. Thyroid Function Tests:  Recent Labs  04/05/2017 1757  TSH 1.058   Anemia Panel: No results for input(s): VITAMINB12, FOLATE, FERRITIN, TIBC, IRON, RETICCTPCT in the last 72 hours. Sepsis Labs:  Recent Labs Lab 04/07/2017 1536 04/05/2017 1757  LATICACIDVEN 4.8* 4.1*    No results found for this or any previous visit (from the past 240 hour(s)).       Radiology Studies: Ct Abdomen Pelvis Wo Contrast  Result Date:  03/30/2017 CLINICAL DATA:  Lower abdominal pain, nausea/vomiting/diarrhea EXAM: CT ABDOMEN AND PELVIS WITHOUT CONTRAST TECHNIQUE: Multidetector CT imaging of the abdomen and pelvis was performed following the standard protocol without IV contrast. COMPARISON:  11/14/2016 FINDINGS: Lower chest: Lung bases are clear. Cardiomegaly. Coronary atherosclerosis. Mitral valve annular calcifications. Hepatobiliary: Unenhanced liver is unremarkable. Status post cholecystectomy. No intrahepatic extrahepatic ductal dilatation. Pancreas: Within normal limits. Spleen: Within normal limits. Adrenals/Urinary Tract: Adrenal glands are within normal limits. Bilateral renal cortical atrophy. No renal calculi or hydronephrosis. Bladder is thick-walled. Associated hyperdense fluid in the bladder is presumably related to prior contrast administration (prior CT, cardiovascular, or interventional procedure) in a patient who is relatively anuric. Stomach/Bowel: Step stomach is within normal limits. No evidence of bowel obstruction. Appendix is not discretely visualized. Sigmoid diverticulosis, without evidence of diverticulitis. Vascular/Lymphatic: No evidence of  abdominal aortic aneurysm. Atherosclerotic calcifications of the abdominal aorta and branch vessels. No suspicious abdominopelvic lymphadenopathy. Reproductive: Uterus is heterogeneous with small calcified uterine fibroids. No adnexal masses. Other: No abdominopelvic ascites. Musculoskeletal: Mild degenerative changes of the visualized thoracolumbar spine. Healed fracture of the right iliac wing. IMPRESSION: No evidence of bowel obstruction. Sigmoid diverticulosis, without evidence of diverticulitis. No CT findings to account for the patient's lower abdominal pain. Additional ancillary findings as above. Electronically Signed   By: Julian Hy M.D.   On: 03/22/2017 13:38   Dg Chest 2 View  Result Date: 03/30/2017 CLINICAL DATA:  Shortness of breath. EXAM: CHEST  2 VIEW  COMPARISON:  Radiographs of March 08, 2016. FINDINGS: Stable cardiomegaly with mild central pulmonary vascular congestion. Atherosclerosis of thoracic aorta is noted. No pneumothorax is noted. Hypoinflation of the lungs is noted with mild bibasilar subsegmental atelectasis. Bony thorax is unremarkable. IMPRESSION: Aortic atherosclerosis. Stable cardiomegaly with mild central pulmonary vascular congestion. Hypoinflation of the lungs with mild bibasilar subsegmental atelectasis. Electronically Signed   By: Marijo Conception, M.D.   On: 03/30/2017 07:37        Scheduled Meds: . clopidogrel  75 mg Oral Daily  . heparin  5,000 Units Subcutaneous Q8H  . latanoprost  1 drop Both Eyes QHS  . mometasone-formoterol  2 puff Inhalation BID  . predniSONE  20 mg Oral QHS   Continuous Infusions: . sodium chloride Stopped (03/28/2017 2258)  . dextrose 5 % and 0.45% NaCl 50 mL/hr at 03/19/2017 2002  . diltiazem (CARDIZEM) infusion 10 mg/hr (03/30/17 0734)  . insulin (NOVOLIN-R) infusion 0.5 Units/hr (03/30/17 0850)     LOS: 1 day        Aline August, MD [facility] Pager [phone]  If 7PM-7AM, please contact night-coverage [email or web address] Password [email or web address] 03/30/2017, 10:08 AM

## 2017-03-30 NOTE — Progress Notes (Signed)
RT Note:  ETT originally placed 24 cm @ lip. Pulled back to 21@ lip cm per xray results and MD order.

## 2017-03-30 NOTE — Code Documentation (Signed)
PATIENT NAME: [name] RECORD NUMBER: [id number] Birthday: 05-23-1930  Age: 83 y.o. Admit Date: 04/16/2017  Provider: Kary Kos  Indication: cardiac arrest   Technical Description:   CPR performance duration: 18 minutes   Was defibrillation or cardioversion used ? No   Was external pacer placed ? No   Was patient intubated pre/post CPR ? Yes during CPR  Was transvenous pacer placed ? no  Medications Administered Include      Yes/no Amiodarone no  Atropin Yes x1  Calcium Yes x1  Epinephrine Yes x2  Lidocaine no  Magnesium no  Norepinephrine Yes started at 10 mcg/kg  Phenylephrine No   Sodium bicarbonate yes  Vasopression no   Evaluation  Final Status - Was patient successfully resuscitated ? yes  If successfully resuscitated - what is current rhythm ? afib If successfully resuscitated - what is current hemodynamic status ? Shock hemodynamic   Miscellaneous Information Called emergently to medical ward for agonal respiratory efforts.  On my arrival pt intubated by RT. Minimal chest rise, but did have CO2 change and condensation in ETT>  hypotensive and still w/ agonal efforts. Could not hear good breath sounds so performed DL. ETT was placed in esophagus. Estimate gastric BMV < 4 minutes. The ETT was left in the esophagus to avoid aspiration, then 7.5 ETT was placed using glide scope (at 1844). Confirmed ETT thru cords, good condensation, good chest rise, and breath sounds.  As ETT being stabilized we lost pulses.  ACLS was initiated.  Initially epi, followed by 1 round of bicarb & calcium chloride (treating empirically for acidosis and hyperkalemia), followed by 2 more rounds of epinephrine.  Time to ROSC estimated at 18 minutes.  Rhythm was afib  Required levophed infusion.  At time of code completion was unresponsive.  ETT pulled back to 22 cm prior to transfer.   Admit to ICU Dr Starla Link was able to reach out to family. We will transfer to ICU.  In the event of further decompensation she will be DNR.   Erick Colace ACNP-BC [place] Pager # [phone] OR # [phone] if no answer  Clementeen Graham 10/14/20187:09 PM

## 2017-03-30 NOTE — Progress Notes (Signed)
Inpatient Diabetes Program Recommendations  AACE/ADA: New Consensus Statement on Inpatient Glycemic Control (2015)  Target Ranges:  Prepandial:   less than 140 mg/dL      Peak postprandial:   less than 180 mg/dL (1-2 hours)      Critically ill patients:  140 - 180 mg/dL   Lab Results  Component Value Date   GLUCAP 103 (H) 03/30/2017   HGBA1C 7.5 02/19/2017    Review of Glycemic Control  Diabetes history: DM2 Outpatient Diabetes medications: Novolin 70/30 5 units QD (taken off insulin on 02/19/2017)Prandin 2 mg tidwc Current orders for Inpatient glycemic control: Lantus 15 units QD, Novolog 0-9 units tidwc and hs  AG - 19     CO2 18 Gap not closed HgbA1C pending. 7.5% on 02/19/2017 Transitioned off IV insulin to SQ. On Prednisone 20 mg QHS Lantus 15 units could result in hypoglycemia in am.  Inpatient Diabetes Program Recommendations:    Lantus 10 units QD. If post-prandials elevated, needs meal coverage insulin.  Will f/u in am.  Thank you. Lorenda Peck, RD, LDN, CDE Inpatient Diabetes Coordinator [phone]

## 2017-03-30 NOTE — Progress Notes (Signed)
Hypoglycemic Event  CBG: 55  Treatment: 25 mL d50 given IV  Symptoms: drowsiness  Follow-up CBG: Time: 1550 CBG Result: 80  Possible Reasons for Event: changes in insulin and minimal PO intake  Comments/MD notified: Hulan Fess

## 2017-03-30 NOTE — Significant Event (Signed)
Rapid Response Event Note  Overview:  Called for bradycardia and hypotension Time Called: 1815 Arrival Time: 1818 Event Type: Respiratory, Cardiac, Hypotension  Initial Focused Assessment:  Called by RN for bradycardia, hypotension.  As per RN patietn received betablocker earlier in day, MD aware of bradycardia and hypotension.  On my arrival Patient in bed on nasal cannula and agonal respirations.  Patient unresponsive to painful stimuli. Placed patient on nrb, not able to obtain sats patietn cold.     Interventions:  Multiple calls to MD.  Code blue called by RRT for respiratory distress.  See code Blue sheet.  Levophed drip started.    Plan of Care (if not transferred):  Patietn transferred to 2h with [facility] ICU RN  Event Summary:   at      at          [facility]

## 2017-03-30 NOTE — Procedures (Signed)
Intubation Procedure Note [name] [id number] 1930-06-09  Procedure: Intubation Indications: Respiratory insufficiency  Procedure Details Consent: Unable to obtain consent because of emergent medical necessity. Time Out: Verified patient identification, verified procedure, site/side was marked, verified correct patient position, special equipment/implants available, medications/allergies/relevent history reviewed, required imaging and test results available.  Performed  Maximum sterile technique was used including antiseptics, gloves, gown, hand hygiene and mask.  Miller and 3 Glide scope    Evaluation Hemodynamic Status: Persistent hypotension treated with pressors and acls; O2 sats: transiently fell during during procedure Patient's Current Condition: unstable Complications: No apparent complications Patient did tolerate procedure well. Chest X-ray ordered to verify placement.  CXR: pending.   Clementeen Graham 03/30/2017  Erick Colace ACNP-BC [place] Pager # [phone] OR # [phone] if no answer

## 2017-03-30 NOTE — Progress Notes (Signed)
[facility] Progress Note  Subjective: feeling a lot better.  BS' s improved.  BP's up some today.   Vitals:   03/30/17 0400 03/30/17 0800 03/30/17 0946 03/30/17 0949  BP:  120/71    Pulse:  70 70   Resp:  17 17   Temp: 97.6 F (36.4 C) 97.8 F (36.6 C)    TempSrc: Oral Oral    SpO2: 100% 100% 100% 100%  Weight:      Height:        Inpatient medications: . clopidogrel  75 mg Oral Daily  . ezetimibe  10 mg Oral q morning - 10a  . heparin  5,000 Units Subcutaneous Q8H  . insulin aspart  0-5 Units Subcutaneous QHS  . insulin aspart  0-9 Units Subcutaneous TID WC  . insulin detemir  15 Units Subcutaneous Daily  . latanoprost  1 drop Both Eyes QHS  . memantine  5 mg Oral BID  . metoprolol tartrate  25 mg Oral BID  . mometasone-formoterol  2 puff Inhalation BID  . pantoprazole  40 mg Oral BID  . predniSONE  20 mg Oral QHS   . sodium chloride Stopped (04/06/2017 2258)  . dextrose 5 % and 0.45% NaCl 50 mL/hr at 04/14/2017 2002  . diltiazem (CARDIZEM) infusion 10 mg/hr (03/30/17 0734)   acetaminophen, albuterol, diphenhydrAMINE  Exam: Gen alert, elderly AAF, looks better, not as dry in the mouth No jvd or bruits Chest clear bilat to bases Cor irreg irreg w/o RG Abd soft ntnd no mass or ascites +bs MS L BKA intact, no LE or UE edema Neuro is alert, Ox 3 , nf   Home meds: -advair/ ventolin hfa -asa/ colestipol/ plavix/ ranexa/ zetia/ imdur/ lipitor/ sl ntg prn -donepezil/ gabapentin/ memantine/ sertraline/ vicodin prn -allopurinol/ mvi/ ppi -hydralazine 25 tid -70/30 insulin 8 u qam -eye drops x 3    Dialysis: [facility]   66kg   2/2.25   Hep 4000   -hect 3 ug tiw -no ESA ( last mircera 30 ug on 8/16) -last Hb 11.7, P 4.6, alb 4.1, CA 8.9 , pth 465, tsat 31%     Impression: 1. ABd pain / diarrhea/ vomiting - improving. CT abd negative 2. AFib with RVR - new onset, on IV dilt, po metoprolol added today 3. ESRD usual HD TTS. Missed Sat  HD, will plan short HD tomorrow then resume TTS 4. Volume - still dry but improving some, cont IVF"s. 4kg under dry wt 5. Hypotension - holding home hydralazine 6. Dementia / psych/ pain - on one SSRI, 2 dementia meds and neurontin/ prn vicodin 7. DM/ DKA - per primary team 8. Hx Cad    Plan - as above   Kelly Splinter MD [facility] pager [phone]   03/30/2017, 10:58 AM    Recent Labs Lab 03/17/2017 2319 03/30/17 0406 03/30/17 0711  NA 133* 134* 133*  K 4.2 4.3 4.1  CL 97* 96* 96*  CO2 19* 20* 18*  GLUCOSE 174* 193* 158*  BUN 44* 44* 46*  CREATININE 5.44* 5.76* 5.79*  CALCIUM 8.0* 8.0* 8.0*    Recent Labs Lab 04/16/2017 1012  AST 113*  ALT 83*  ALKPHOS 121  BILITOT 1.2  PROT 6.7  ALBUMIN 3.9    Recent Labs Lab 04/04/2017 1012  WBC 13.5*  NEUTROABS 11.0*  HGB 12.4  HCT 37.7  MCV 88.9  PLT 136*   Iron/TIBC/Ferritin/ %Sat    Component Value Date/Time   IRON 52  05/09/2015 1553   TIBC 319 05/09/2015 1553   IRONPCTSAT 16 05/09/2015 1553

## 2017-03-30 NOTE — Code Documentation (Deleted)
CODE BLUE NOTE  Patient Name: [name]   MRN: [id number]   Date of Birth/ Sex: 1929/09/11 , female      Admission Date: 03/28/2017  Attending Provider: Aline August, MD  Primary Diagnosis: Atrial fibrillation with RVR [facility])    Indication: Pt was in her usual state of health until this PM, when she was noted to have agonal breathing and in acute respiratory distress. Code blue was subsequently called. At the time of arrival on scene, ACLS protocol was underway.    Technical Description:  - CPR performance duration:                    2  minutes  - Was defibrillation or cardioversion used? No   - Was external pacer placed? No  - Was patient intubated pre/post CPR? Yes, pre    Medications Administered: Y = Yes; Blank = No Amiodarone  n  Atropine  n  Calcium  y  Epinephrine  n  Lidocaine  n  Magnesium  n  Norepinephrine  y  Phenylephrine  n  Sodium bicarbonate  y  Vasopressin  n    Post CPR evaluation:  - Final Status - Was patient successfully resuscitated ? Yes - What is current rhythm? sinus - What is current hemodynamic status? stable   Miscellaneous Information:  - Labs sent, including: glucose  - Primary team notified?  Yes  - Family Notified? Yes  - Additional notes/ transfer status:  Patient is subsequently being transferred to ICU under the care of CCM.        Guadalupe Dawn, MD  03/30/2017, 7:18 PM

## 2017-03-30 NOTE — Progress Notes (Signed)
Pt's HR high 40s-50s. MD notified. Received order to continue to monitor pt. Pt received beta blocker around 1130 am.    Grant Fontana BSN, RN
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
## 2017-03-30 NOTE — Progress Notes (Signed)
RT called to room regarding patient distress. Patient was agonal breathing in respiratory distress with no response to painful stimuli. Code Blue was called and patient was intubated by RT Benjamin Stain. Patient to be moved to ICU as soon as stabilized. RT will continue to monitor.

## 2017-03-30 NOTE — Progress Notes (Signed)
Glucose 30 in pt's right hand and 113 in left hand. STAT CMP ordered for verification. Pt responsive, but drowsy. Will continue current plan of care.   Grant Fontana BSN, RN

## 2017-03-30 NOTE — Progress Notes (Signed)
Arterial blood gas results given to Dr.Scasttliffe with CCM. Respiratory rate increased to 28bpm

## 2017-03-30 NOTE — Progress Notes (Signed)
Pt found with agonal breathing and hypotension. Unresponsive. Rapid response RN called. MD notified. Code blue called. MD notified again. Code blue sheet filled out. Pt transported to 2h08. Receiving RN given report and all questions answered. Family at bedside prior to code being called. Updated on pt's status by MD and aware of pt's transfer to 2h.   Grant Fontana BSN, RN

## 2017-03-30 NOTE — Progress Notes (Signed)
*  PRELIMINARY RESULTS* Echocardiogram 2D Echocardiogram has been performed.  Leavy Cella 03/30/2017, 3:48 PM

## 2017-03-31 DIAGNOSIS — L8990 Pressure ulcer of unspecified site, unspecified stage: Secondary | ICD-10-CM | POA: Insufficient documentation

## 2017-03-31 DIAGNOSIS — I2510 Atherosclerotic heart disease of native coronary artery without angina pectoris: Secondary | ICD-10-CM

## 2017-03-31 LAB — COMPREHENSIVE METABOLIC PANEL
ALT: 2285 U/L — ABNORMAL HIGH (ref 14–54)
AST: 5459 U/L — ABNORMAL HIGH (ref 15–41)
Albumin: 3.1 g/dL — ABNORMAL LOW (ref 3.5–5.0)
Alkaline Phosphatase: 130 U/L — ABNORMAL HIGH (ref 38–126)
Anion gap: 30 — ABNORMAL HIGH (ref 5–15)
BUN: 51 mg/dL — ABNORMAL HIGH (ref 6–20)
CHLORIDE: 94 mmol/L — AB (ref 101–111)
CO2: 13 mmol/L — AB (ref 22–32)
CREATININE: 6.45 mg/dL — AB (ref 0.44–1.00)
Calcium: 8.3 mg/dL — ABNORMAL LOW (ref 8.9–10.3)
GFR, EST AFRICAN AMERICAN: 6 mL/min — AB (ref >60)
GFR, EST NON AFRICAN AMERICAN: 5 mL/min — AB (ref >60)
Glucose, Bld: 163 mg/dL — ABNORMAL HIGH (ref 65–99)
POTASSIUM: 5.1 mmol/L (ref 3.5–5.1)
SODIUM: 137 mmol/L (ref 135–145)
Total Bilirubin: 2.7 mg/dL — ABNORMAL HIGH (ref 0.3–1.2)
Total Protein: 5.2 g/dL — ABNORMAL LOW (ref 6.5–8.1)

## 2017-03-31 LAB — POCT I-STAT 3, ART BLOOD GAS (G3+)
ACID-BASE DEFICIT: 14 mmol/L — AB (ref 0.0–2.0)
Bicarbonate: 12.7 mmol/L — ABNORMAL LOW (ref 20.0–28.0)
O2 SAT: 100 %
PH ART: 7.22 — AB (ref 7.350–7.450)
PO2 ART: 258 mmHg — AB (ref 83.0–108.0)
Patient temperature: 97.6
TCO2: 14 mmol/L — AB (ref 22–32)
pCO2 arterial: 30.7 mmHg — ABNORMAL LOW (ref 32.0–48.0)

## 2017-03-31 LAB — CBC WITH DIFFERENTIAL/PLATELET
BASOS ABS: 0 10*3/uL (ref 0.0–0.1)
Basophils Relative: 0 %
EOS PCT: 0 %
Eosinophils Absolute: 0 10*3/uL (ref 0.0–0.7)
HCT: 36.9 % (ref 36.0–46.0)
Hemoglobin: 11.4 g/dL — ABNORMAL LOW (ref 12.0–15.0)
LYMPHS ABS: 0.6 10*3/uL — AB (ref 0.7–4.0)
Lymphocytes Relative: 2 %
MCH: 29.1 pg (ref 26.0–34.0)
MCHC: 30.9 g/dL (ref 30.0–36.0)
MCV: 94.1 fL (ref 78.0–100.0)
MONO ABS: 1.1 10*3/uL — AB (ref 0.1–1.0)
Monocytes Relative: 4 %
NEUTROS PCT: 94 %
Neutro Abs: 26.8 10*3/uL — ABNORMAL HIGH (ref 1.7–7.7)
PLATELETS: 87 10*3/uL — AB (ref 150–400)
RBC: 3.92 MIL/uL (ref 3.87–5.11)
RDW: 16.7 % — AB (ref 11.5–15.5)
WBC: 28.5 10*3/uL — AB (ref 4.0–10.5)

## 2017-03-31 LAB — GLUCOSE, CAPILLARY
GLUCOSE-CAPILLARY: 30 mg/dL — AB (ref 65–99)
GLUCOSE-CAPILLARY: 193 mg/dL — AB (ref 65–99)
Glucose-Capillary: 120 mg/dL — ABNORMAL HIGH (ref 65–99)
Glucose-Capillary: 319 mg/dL — ABNORMAL HIGH (ref 65–99)

## 2017-03-31 LAB — ECHOCARDIOGRAM COMPLETE
Height: 66 in
WEIGHTICAEL: 2176 [oz_av]

## 2017-03-31 LAB — HEMOGLOBIN A1C
HEMOGLOBIN A1C: 9.2 % — AB (ref 4.8–5.6)
MEAN PLASMA GLUCOSE: 217.34 mg/dL

## 2017-03-31 LAB — MAGNESIUM: MAGNESIUM: 2.4 mg/dL (ref 1.7–2.4)

## 2017-03-31 MED ORDER — INSULIN ASPART 100 UNIT/ML ~~LOC~~ SOLN
8.0000 [IU] | Freq: Once | SUBCUTANEOUS | Status: AC
  Administered 2017-03-31: 8 [IU] via SUBCUTANEOUS

## 2017-03-31 MED ORDER — SODIUM CHLORIDE 0.9 % IV SOLN
10.0000 mg/h | INTRAVENOUS | Status: DC
  Administered 2017-03-31: 10 mg/h via INTRAVENOUS
  Filled 2017-03-31 (×2): qty 10

## 2017-03-31 MED ORDER — MORPHINE BOLUS VIA INFUSION: 5.0000 mg | INTRAVENOUS | Status: DC | PRN

## 2017-03-31 MED FILL — Medication: Qty: 1 | Status: AC

## 2017-04-02 ENCOUNTER — Telehealth: Payer: Self-pay

## 2017-04-02 ENCOUNTER — Ambulatory Visit: Payer: Medicare HMO | Admitting: Cardiology

## 2017-04-02 NOTE — Telephone Encounter (Signed)
On 04/02/17 I received a d/c from [facility] (original). The d/c is for burial. The patient is a patient of Doctor Titus Mould. The d/c will be taken to [facility] this pm for signature.  On 04/04/17 I received the d/c back from Doctor Titus Mould. I got the d/c ready and called the funeral home to let them know the d/c is ready for pickup.

## 2017-04-03 LAB — CULTURE, BLOOD (ROUTINE X 2)
Culture: NO GROWTH
Culture: NO GROWTH
Special Requests: ADEQUATE
Special Requests: ADEQUATE

## 2017-04-17 NOTE — Progress Notes (Addendum)
.. ..  Name: [name] MRN: [id number] DOB: [birth year]    ADMISSION DATE:  04/14/2017 CONSULTATION DATE:03/30/17 REFERRING MD : Starla Link MD  CHIEF COMPLAINT: s/p CODE BLUE on 10/14  BRIEF PATIENT DESCRIPTION: 82 year old female PMHx ESRD on HD TTS ( missed Sat HD), CAD, combined chronic systolic and diastolic heart failure ( ECHO pending official read), dyslipidemia, HTN, IDDM ( previously was only on an oral agent), diverticulosis, PVD status post left BKA in 2014, COPD and bradycardia presented with abdominal pain with nausea and vomiting. She was found to be in new onset atrial fibrillation with rapid ventricular rate along with DKA with leukocytosis and mildly positive troponins.  She was started on insulin drip and Cardizem drip.  S/p CODE BLUE on 03/30/17 @7 :56 PM  SIGNIFICANT EVENTS  CODE BLUE- 03/30/17- 7:43PM  STUDIES:  CXR- post intubation ECHO- completed earlier today   HISTORY OF PRESENT ILLNESS:  83 year old female PMHx ESRD on HD TTS ( missed Sat HD), CAD, combined chronic systolic and diastolic heart failure ( ECHO pending official read), dyslipidemia, HTN, IDDM ( previously was only on an oral agent), diverticulosis, PVD status post left BKA in 2014, COPD and bradycardia presented with abdominal pain with nausea and vomiting. She was found to be in new onset atrial fibrillation with rapid ventricular rate along with DKA with leukocytosis and mildly positive troponins. She was started on insulin drip and Cardizem drip. Recently started on Metoprolol S/p CODE BLUE on 03/30/17 @7 :43 PM Lasted 18 mins and pt was intubated and received Atropine, Calcium, Epi and was started on Levophed. Transferred to ICU. Initial ABG ph 6.9  PAST MEDICAL HISTORY :   has a past medical history of Arthritis; Asthma; Bradycardia; CAD (coronary artery disease); Cardiomyopathy ([place]); Chronic combined systolic and diastolic CHF (congestive heart failure) ([place]); CKD (chronic kidney disease),  stage III ([facility]); COPD (chronic obstructive pulmonary disease) ([place]); Depression; Diabetes mellitus; Diverticulosis; DM retinopathy ([place]); Dyslipidemia; GERD (gastroesophageal reflux disease); Hiatal hernia; History of shingles; Hypertension; Noncompliance; Obesity; Osteoarthritis cervical spine; Peptic stricture of esophagus; Peptic ulcer disease; Peripheral vascular disease ([place]); Pruritic condition; Shortness of breath dyspnea; Sleep apnea; Uterine cancer ([place]); and Zoster.  has a past surgical history that includes Tubal ligation (1967); Knee arthroscopy (10/1998); Craniotomy (1997); Cataract extraction, bilateral (2005); Hernia repair; Esophagogastroduodenoscopy (04/04/2004); Spine surgery; Cholecystectomy (2010); Cardiac catheterization; DEXA (7/05); Amputation (Left, 09/04/2012); abdominal angiogram (N/A, 08/31/2012); Tubal ligation; left heart catheterization with coronary angiogram (N/A, 06/22/2014); Eye surgery; Brain surgery; Multiple extractions with alveoloplasty (N/A, 08/01/2014); Cardiac catheterization (N/A, 03/07/2015); and AV fistula placement (Left, 05/10/2015). Prior to Admission medications   Medication Sig Start Date End Date Taking? Authorizing Provider  acetaminophen (TYLENOL) 500 MG tablet Take 500 mg by mouth every 6 (six) hours as needed for mild pain.   Yes [provider]  albuterol (PROVENTIL HFA;VENTOLIN HFA) 108 (90 BASE) MCG/ACT inhaler Inhale 2 puffs into the lungs every 4 (four) hours as needed for wheezing or shortness of breath. 04/19/15  Yes Renato Shin, MD  allopurinol (ZYLOPRIM) 100 MG tablet TAKE 1 TABLET(100 MG) BY MOUTH DAILY 11/27/16  Yes Renato Shin, MD  aspirin EC 81 MG tablet Take 1 tablet (81 mg total) by mouth daily. 03/16/15  Yes Dunn, Dayna N, PA-C  atorvastatin (LIPITOR) 40 MG tablet TAKE 1 TABLET BY MOUTH DAILY AT 6 PM Patient taking differently: Take 40 mg by mouth daily at 6 PM. TAKE 1 TABLET BY MOUTH DAILY AT 6 PM 11/27/16  Yes Renato Shin, MD  clopidogrel (PLAVIX) 75 MG tablet Take 1 tablet (75 mg total) by mouth daily. 10/04/16  Yes Dorothy Spark, MD  colestipol (COLESTID) 1 g tablet Take 3 tablets (3 g total) by mouth daily. 02/26/17  Yes Renato Shin, MD  donepezil (ARICEPT) 5 MG tablet TAKE 1 TABLET(5 MG) BY MOUTH AT BEDTIME 11/27/16  Yes Renato Shin, MD  ezetimibe (ZETIA) 10 MG tablet Take 1 tablet (10 mg total) by mouth every morning. 12/20/16  Yes Renato Shin, MD  feeding supplement (GLUCERNA SHAKE) LIQD Take 237 mLs by mouth every morning.    Yes [provider]  Fluticasone-Salmeterol (ADVAIR DISKUS) 100-50 MCG/DOSE AEPB Inhale 1 puff into the lungs 2 (two) times daily. 04/19/15  Yes Renato Shin, MD  gabapentin (NEURONTIN) 100 MG capsule Take 1 capsule (100 mg total) by mouth at bedtime. 12/20/16  Yes Renato Shin, MD  hydrALAZINE (APRESOLINE) 25 MG tablet TAKE 1 TABLET(25 MG) BY MOUTH THREE TIMES DAILY Patient taking differently: Take 25 mg by mouth 3 (three) times daily. TAKE 1 TABLET(25 MG) BY MOUTH THREE TIMES DAILY 12/20/16  Yes Renato Shin, MD  isosorbide mononitrate (IMDUR) 60 MG 24 hr tablet Take 90 mg by mouth daily. 12/20/16  Yes [provider]  latanoprost (XALATAN) 0.005 % ophthalmic solution Place 1 drop into both eyes at bedtime.   Yes [provider]  memantine (NAMENDA) 5 MG tablet Take 1 tablet (5 mg total) by mouth 2 (two) times daily. 12/20/16  Yes Renato Shin, MD  nitroGLYCERIN (NITROSTAT) 0.4 MG SL tablet Place 1 tablet (0.4 mg total) under the tongue every 5 (five) minutes as needed for chest pain. 03/04/17  Yes Dorothy Spark, MD  pantoprazole (PROTONIX) 40 MG tablet TAKE 1 TABLET BY MOUTH TWICE DAILY Patient taking differently: TAKE 40 mg  TABLET BY MOUTH TWICE DAILY 01/14/17  Yes Renato Shin, MD  predniSONE (DELTASONE) 20 MG tablet Take 20 mg by mouth at bedtime. 03/27/17  Yes [provider]  RANEXA 500 MG 12 hr tablet TAKE 1 TABLET BY MOUTH TWICE  DAILY Patient taking differently: TAKE 500 mg TABLET BY MOUTH TWICE DAILY 02/27/17  Yes Dorothy Spark, MD  repaglinide (PRANDIN) 2 MG tablet Take 1 tablet (2 mg total) by mouth 3 (three) times daily before meals. 02/19/17  Yes Renato Shin, MD  sertraline (ZOLOFT) 100 MG tablet TAKE 1 TABLET(100 MG) BY MOUTH DAILY 12/20/16  Yes Renato Shin, MD  insulin NPH-regular Human (NOVOLIN 70/30) (70-30) 100 UNIT/ML injection Inject 5 Units into the skin daily with breakfast. And syringes 1/day 04/14/2017   Renato Shin, MD  isosorbide mononitrate (IMDUR) 30 MG 24 hr tablet Take 3 tablets (90 mg total) by mouth daily. Patient not taking: Reported on 04/14/2017 03/26/17   Renato Shin, MD   Allergies  Allergen Reactions  . Iohexol Anaphylaxis, Itching and Rash     Code: RASH, Desc: [place] ON PT'S CHART ALLERGIC TO IV DYE 09/04/07/RM, Onset Date: [id number]     FAMILY HISTORY:  family history includes Asthma in her other; Cancer - Other in her mother; Diabetes in her mother and sister; Heart disease in her mother; Hypertension in her father and mother; Kidney disease in her paternal grandmother; Lymphoma in her father; Rheum arthritis in her father and mother; Stomach cancer in her mother; Stroke in her paternal grandmother.   SOCIAL HISTORY:  reports that she has never smoked. She has never used smokeless tobacco. She reports that she does not drink alcohol or use drugs.  REVIEW OF SYSTEMS:   Review of Systems  Unable to perform ROS: Critical illness   SUBJECTIVE:  Patient lying in bed.  Unresponsive to commands Awaiting families decision to consider withdrawing care  VITAL SIGNS: Temp:  [97.5 F (36.4 C)-97.6 F (36.4 C)] 97.6 F (36.4 C) (10/15 0400) Pulse Rate:  [53-111] 77 (10/15 0700) Resp:  [14-29] 28 (10/15 0700) BP: (79-114)/(44-81) 106/70 (10/15 0700) SpO2:  [95 %-100 %] 98 % (10/15 0735) FiO2 (%):  [24 %-100 %] 30 % (10/15 0735) Weight:  [149 lb 0.5 oz (67.6 kg)] 149 lb  0.5 oz (67.6 kg) (10/14 1945)  PHYSICAL EXAMINATION: General: not alert or following commands intubated Neuro:  no focal deficit appreciated EOM intact HEENT: intubated, normocephalic exterior Cardiovascular: S1 S2 irregularly irregular HR with normal rate Lungs: coarse breath sounds bilaterally Abdomen: soft + BS Musculoskeletal:  No deformity noted except BKA on left, well healed site no evidence of breakdown Skin: small pressure ulcer (prev documented) as per nursing    Recent Labs Lab 03/30/17 0711 03/30/17 2051 04/16/17 0218  NA 133* 136 137  K 4.1 5.3* 5.1  CL 96* 95* 94*  CO2 18* 10* 13*  BUN 46* 49* 51*  CREATININE 5.79* 6.35* 6.45*  GLUCOSE 158* 99 163*    Recent Labs Lab 03/30/17 1931 03/30/17 2051 Apr 16, 2017 0218  HGB 12.0 11.6* 11.4*  HCT 37.6 36.2 36.9  WBC 21.8* 24.2* 28.5*  PLT PLATELET CLUMPS NOTED ON SMEAR, COUNT APPEARS DECREASED 86* 87*   Ct Abdomen Pelvis Wo Contrast  Result Date: 04/05/2017 CLINICAL DATA:  Lower abdominal pain, nausea/vomiting/diarrhea EXAM: CT ABDOMEN AND PELVIS WITHOUT CONTRAST TECHNIQUE: Multidetector CT imaging of the abdomen and pelvis was performed following the standard protocol without IV contrast. COMPARISON:  11/14/2016 FINDINGS: Lower chest: Lung bases are clear. Cardiomegaly. Coronary atherosclerosis. Mitral valve annular calcifications. Hepatobiliary: Unenhanced liver is unremarkable. Status post cholecystectomy. No intrahepatic extrahepatic ductal dilatation. Pancreas: Within normal limits. Spleen: Within normal limits. Adrenals/Urinary Tract: Adrenal glands are within normal limits. Bilateral renal cortical atrophy. No renal calculi or hydronephrosis. Bladder is thick-walled. Associated hyperdense fluid in the bladder is presumably related to prior contrast administration (prior CT, cardiovascular, or interventional procedure) in a patient who is relatively anuric. Stomach/Bowel: Step stomach is within normal limits. No  evidence of bowel obstruction. Appendix is not discretely visualized. Sigmoid diverticulosis, without evidence of diverticulitis. Vascular/Lymphatic: No evidence of abdominal aortic aneurysm. Atherosclerotic calcifications of the abdominal aorta and branch vessels. No suspicious abdominopelvic lymphadenopathy. Reproductive: Uterus is heterogeneous with small calcified uterine fibroids. No adnexal masses. Other: No abdominopelvic ascites. Musculoskeletal: Mild degenerative changes of the visualized thoracolumbar spine. Healed fracture of the right iliac wing. IMPRESSION: No evidence of bowel obstruction. Sigmoid diverticulosis, without evidence of diverticulitis. No CT findings to account for the patient's lower abdominal pain. Additional ancillary findings as above. Electronically Signed   By: Julian Hy M.D.   On:  13:38   Dg Chest 2 View  Result Date: 03/30/2017 CLINICAL DATA:  Shortness of breath. EXAM: CHEST  2 VIEW COMPARISON:  Radiographs of March 08, 2016. FINDINGS: Stable cardiomegaly with mild central pulmonary vascular congestion. Atherosclerosis of thoracic aorta is noted. No pneumothorax is noted. Hypoinflation of the lungs is noted with mild bibasilar subsegmental atelectasis. Bony thorax is unremarkable. IMPRESSION: Aortic atherosclerosis. Stable cardiomegaly with mild central pulmonary vascular congestion. Hypoinflation of the lungs with mild bibasilar subsegmental atelectasis. Electronically Signed   By: Marijo Conception, M.D.   On: 03/30/2017 07:37  Dg Chest Port 1 View  Result Date: 03/30/2017 CLINICAL DATA:  Post intubation. EXAM: PORTABLE CHEST 1 VIEW COMPARISON:  03/30/2017 FINDINGS: Endotracheal tube with tip measuring 4 cm above the carina. Shallow inspiration. Cardiac enlargement without vascular congestion. There is focal infiltration in the right lower lung. This could represent asymmetrical edema or pneumonia. No blunting of costophrenic angles. No  pneumothorax. Calcification of the aorta. Vascular stent in the left axilla. Degenerative changes in the spine. IMPRESSION: Endotracheal tube appears in appropriate position. Cardiac enlargement. Focal airspace disease in the right lower lung may indicate asymmetrical edema or pneumonia. Electronically Signed   By: Lucienne Capers M.D.   On: 03/30/2017 21:22   Dg Chest Port 1 View  Addendum Date: 03/30/2017   ADDENDUM REPORT: 03/30/2017 19:43 ADDENDUM: These results were called by telephone at the time of interpretation on 03/30/2017 at 7:33 pm to Dr. Salvadore Dom , who verbally acknowledged these results. Electronically Signed   By: Fidela Salisbury M.D.   On: 03/30/2017 19:43   Result Date: 03/30/2017 CLINICAL DATA:  Respiratory failure post intubation. EXAM: PORTABLE CHEST 1 VIEW COMPARISON:  03/30/2017 FINDINGS: Post intubation. Endotracheal tube terminates at the carina. Readjustment is recommended. Cardiomediastinal silhouette is enlarged. Prominent calcific atherosclerotic disease of the aorta. No evidence of pneumothorax. Worsening of the aeration of the lungs with bilateral patchy alveolar in interstitial opacities. Possible small subpulmonic effusions. IMPRESSION: Endotracheal tube terminates at the carina. Retraction by 3 cm is recommended. Significant worsening of the appearance of the lungs with bilateral diffuse patchy airspace opacities which may represent mixed pattern edema or multifocal pneumonia. Electronically Signed: By: Fidela Salisbury M.D. On: 03/30/2017 19:26   US Abdomen Limited Ruq  Result Date: 03/30/2017 CLINICAL DATA:  Abdominal pain, history of prior cholecystectomy EXAM: ULTRASOUND ABDOMEN LIMITED RIGHT UPPER QUADRANT COMPARISON:  04/08/2017 FINDINGS: Gallbladder: Surgically removed Common bile duct: Diameter: 4.6 mm. Liver: No focal lesion identified. Within normal limits in parenchymal echogenicity. Portal vein is patent on color Doppler imaging with normal  direction of blood flow towards the liver. IMPRESSION: Status post cholecystectomy.  No other focal abnormality is noted. Electronically Signed   By: Inez Catalina M.D.   On: 03/30/2017 16:32    ASSESSMENT / PLAN: NEURO: H/o Dementia currently intubated and sedated GCS 9 prior to sedation On initial exam awake and pulling at ETT Started on soft wrist restraints Started on continuous Fentanyl with intermittent Versed No history of seizures Last [facility] was in July 2014 and demonstrated moderate cerebral and cerebellar atrophy with chronic microvascular ischemic disease. Previously on Aricept and Namenda for Dementia- held at this time.  CARDIAC: S/p CODE BLUE- 18 mins on 10/14 @ 1700 hours Was RRT Bradycardic and Hypotensive Pt received atropine, calcium, epi and bicarb and was started on vasopressor Levophed ggt - MAP goal 60 or greater Afib RVR CHADS2VASC 7 previously on Cardizem and Heparin ggt- held at this time HR remains <100 during exam today  Combined chronic systolic and diastolic heart failure  ECHO performed 03/30/17 Results are still pending  Previous ECHO in May showed EF 60-65% CAD - continues on Plavix  PULMONARY: H/o COPD Intubated on PRVC TV 450 RR 28 PEEP 6 FiO2 30% Initial ABG ph 6.9/36/112/8 - Severe AG Metabolic Acidosis with respiratory alkalosis Most recent Lactic acid 15.5 10/14 Increased minute ventilation to compensate CXR post intubation was at carina RT to pull it back by 3 cm and CXR repeated Repeat ABG in 2 hrs  ID: Blood cultures done  recently on 10/13- no growth to date 10/15 MRSA PCR is negative  Endocrine: TSH- 1.058 IDDM - admitted in DKA, CBG 193 this am Last AG 19  On D5 1/2 NS Started on stress dose steroids ICU Glycemic protocol  GI: NPO PPI for prophylaxis  No OGT inserted  Heme: Hemoglobin 11.4, Platelets 87 No signs of active bleeding or coagulopathy On Heparin ggt for Afib RVR with elevated CHADS2VASC Held heparin at this  time post compressions and now that HR is controlled Hgb<7 transfuse PRBCs DVT PPx-> held due to heparin ggt SCDs  RENAL ESRD on HD TTS Missed Saturday dialysis Will continue IVF Continue to hold hydralazine and metoprolol Baseline Cr- noted .Marland Kitchen Lab Results  Component Value Date   CREATININE 6.45 (H) 04-29-2017   CREATININE 6.35 (H) 03/30/2017   CREATININE 5.79 (H) 03/30/2017   Anion Gap Metabolic Acidosis Received 2 amps of bicarb and started on Bicarb ggt Previously on insulin ggt Repeat CMP pending  If AG+-> will restart DKA protocol  FAMILY: At bedside. Husband makes decisions regarding care a son also present  FAMILY DISCUSSION Her husband previously expressed to Dr. Seward Carol that he did not want her to suffer any longer and he did not desire for her to have any surgical procedures such as central lines etc. He agreed to continue current interventions until the remainder of family arrives. They will have a conversation to explain the current status and at that time withdraw care.  04/29/17, 8:26 AM  STAFF NOTE: I, Merrie Roof, MD FACP have personally reviewed patient's available data, including medical history, events of note, physical examination and test results as part of my evaluation. I have discussed with resident/NP and other care providers such as pharmacist, RN and RRT. In addition, I personally evaluated patient and elicited key findings of: not awake, not fc, will open eyes and move upper ext to pain, per 91mm, ett, jvd wnl to elevated, cta anterior, abdo soft, left clean BKA, pcxr which I reviewed shows bibasilar int changes ett wnl, calcifications c/w pulm edema, pos 5 liters in last 2 days, levophed is required at 10 at this stage, she has MODS, shock liver and multi organ failure, she requires comfort care, will discuss with husband, no escalation and comfort care approach, lactic acid indicates prolonged down time, and in current suation promotes a  poor prognosis, would also dc HD, will d/w family peep to 5 goal, will assess cpap 5 ps 5 for 30 sec to predict comfort needs meds The patient is critically ill with multiple organ systems failure and requires high complexity decision making for assessment and support, frequent evaluation and titration of therapies, application of advanced monitoring technologies and extensive interpretation of multiple databases.   Critical Care Time devoted to patient care services described in this note is 35 Minutes. This time reflects time of care of this signee: Merrie Roof, MD FACP. This critical care time does not reflect procedure time, or teaching time or supervisory time of PA/NP/Med student/Med Resident etc but could involve care discussion time. Rest per NP/medical resident whose note is outlined above and that I agree with   Lavon Paganini. Titus Mould, MD, [place] Pgr: [facility] 04-29-2017 10:21 AM  Extensive discussion with family husband. We discussed the poor prognosis and likely poor quality of life. Family has decided to offer full comfort care. They are aware that the patient may be transferred to palliative care floor for continued comfort care needs. They  have been fully updated on the process and expectations.

## 2017-04-17 NOTE — Progress Notes (Signed)
Visiting with husband and daughter and other family members of this patient.  Husband of 45 years at bedside and their daughter talking about how different life is going to be without her.  They are thankful to have good care for her but also know she wouldn't want vent support.  Family is thankful to God for presence and for her peace and how she doesn't seem to be struggling and suffering.  I will continue to be around today to support this family who each hugged me and welcomed me into their space.  I have offered prayer and will pray with them throughout this process.  Chaplain would like to thank the entire staff and medical team for caring for this patient and family.    04-10-2017 1149  Clinical Encounter Type  Visited With Patient and family together  Visit Type Initial;Psychological support;Spiritual support;Social support;Critical Care  Referral From Nurse  Consult/Referral To Chaplain  Spiritual Encounters  Spiritual Needs Emotional

## 2017-04-17 NOTE — Discharge Summary (Signed)
Death NOte  83 year old female PMHx ESRD on HD TTS ( missed Sat HD), CAD, combined chronic systolic and diastolic heart failure ( ECHO pending official read), dyslipidemia, HTN, IDDM ( previously was only on an oral agent), diverticulosis, PVD status post left BKA in 2014, COPD and bradycardia presented with abdominal pain with nausea and vomiting. She was found to be in new onset atrial fibrillation with rapid ventricular rate along with DKA with leukocytosis and mildly positive troponins.  She was started on insulin drip and Cardizem drip.  S/p CODE BLUE on 03/30/17 @7 :60 PM  SIGNIFICANT EVENTS   she has MODS, shock liver and multi organ failure, she requires comfort care, will discuss with husband, no escalation and comfort care approach, lactic acid indicates prolonged down time, and in current suation promotes a poor prognosis, would also dc HD, will d/w family peep to 5 goal, will assess cpap 5 ps 5 for 30 sec to predict comfort needs meds Comfort provided - expired  Diagnosis Upon Death  1. Anoxic brain injury 2. Comfort care 3. Afib 4. Cardiac arrest 5. ESRD 6. combined chronic systolic and diastolic heart failure   Lavon Paganini. Titus Mould, MD, [place] Pgr: [facility]\

## 2017-04-17 NOTE — Progress Notes (Signed)
Progress Note  Patient Name: [name] Date of Encounter: 2017-04-17  Primary Cardiologist: Dr. Meda Coffee  Subjective   Intubated after code blue yesterday at 7:43 pm  Inpatient Medications    Scheduled Meds: . chlorhexidine gluconate (MEDLINE KIT)  15 mL Mouth Rinse BID  . clopidogrel  75 mg Oral Daily  . ezetimibe  10 mg Oral q morning - 10a  . fentaNYL (SUBLIMAZE) injection  50 mcg Intravenous Once  . heparin  5,000 Units Subcutaneous Q8H  . hydrocortisone sod succinate (SOLU-CORTEF) inj  100 mg Intravenous Q8H  . insulin aspart  2-6 Units Subcutaneous Q4H  . insulin aspart  8 Units Subcutaneous Once  . latanoprost  1 drop Both Eyes QHS  . mouth rinse  15 mL Mouth Rinse 10 times per day  . pantoprazole (PROTONIX) IV  40 mg Intravenous Q12H   Continuous Infusions: . sodium chloride 50 mL/hr at 04/17/2017 0358  . dextrose 5 % and 0.45% NaCl 50 mL/hr at 03/30/17 1516  . fentaNYL infusion INTRAVENOUS 50 mcg/hr (03/30/17 2008)  . norepinephrine (LEVOPHED) Adult infusion 10 mcg/min (04-17-17 0908)  .  sodium bicarbonate  infusion 1000 mL 100 mL/hr at Apr 17, 2017 0603  . sodium chloride     PRN Meds: acetaminophen, albuterol, diphenhydrAMINE, fentaNYL, midazolam, midazolam, ondansetron (ZOFRAN) IV   Vital Signs    Vitals:   04/17/2017 0700 17-Apr-2017 0735 04/17/17 0800 04/17/2017 0845  BP: 106/70  (!) 100/58   Pulse: 77  73   Resp: (!) 28  (!) 28   Temp:    98.5 F (36.9 C)  TempSrc:    Axillary  SpO2: 100% 98% 100%   Weight:      Height:        Intake/Output Summary (Last 24 hours) at 04-17-2017 1003 Last data filed at 04/17/17 0800  Gross per 24 hour  Intake          3670.13 ml  Output                0 ml  Net          3670.13 ml   Filed Weights   04/13/2017 1002 03/30/17 1945  Weight: 136 lb (61.7 kg) 149 lb 0.5 oz (67.6 kg)    Telemetry    A fib rate controlled - Personally Reviewed  ECG    No new - Personally Reviewed  Physical Exam   GEN: No  acute distress, resting comfortable sedated, intubated on vent   Neck: No JVD Cardiac: irreg irreg, no murmurs, rubs, or gallops.  Respiratory: Clear to auscultation bilaterally, ant.. GI: Soft, nontender, non-distended  MS: No edema; No deformity. LBKA, Rt with compression stocking Neuro:  open eyes when examined, does not follow instructions  Psych: flat affect - sedated  Labs    Chemistry Recent Labs Lab 03/23/2017 1012  03/30/17 0711 03/30/17 2051 Apr 17, 2017 0218  NA 128*  < > 133* 136 137  K 4.4  < > 4.1 5.3* 5.1  CL 89*  < > 96* 95* 94*  CO2 20*  < > 18* 10* 13*  GLUCOSE 407*  < > 158* 99 163*  BUN 39*  < > 46* 49* 51*  CREATININE 5.48*  < > 5.79* 6.35* 6.45*  CALCIUM 8.5*  < > 8.0* 8.4* 8.3*  PROT 6.7  --   --  5.1* 5.2*  ALBUMIN 3.9  --   --  2.9* 3.1*  AST 113*  --   --  2,356* 5,459*  ALT 83*  --   --  1,247* 2,285*  ALKPHOS 121  --   --  131* 130*  BILITOT 1.2  --   --  2.4* 2.7*  GFRNONAA 6*  < > 6* 5* 5*  GFRAA 7*  < > 7* 6* 6*  ANIONGAP 19*  < > 19* 31* 30*  < > = values in this interval not displayed.   Hematology Recent Labs Lab 03/30/17 1931 03/30/17 2051 04/22/2017 0218  WBC 21.8* 24.2* 28.5*  RBC 3.96 3.80* 3.92  HGB 12.0 11.6* 11.4*  HCT 37.6 36.2 36.9  MCV 94.9 95.3 94.1  MCH 30.3 30.5 29.1  MCHC 31.9 32.0 30.9  RDW 16.7* 16.8* 16.7*  PLT PLATELET CLUMPS NOTED ON SMEAR, COUNT APPEARS DECREASED 86* 87*    Cardiac Enzymes Recent Labs Lab 03/30/17 0945  TROPONINI 0.33*    Recent Labs Lab 04/15/2017 1109  TROPIPOC 0.36*     BNPNo results for input(s): BNP, PROBNP in the last 168 hours.   DDimer No results for input(s): DDIMER in the last 168 hours.   Radiology    Ct Abdomen Pelvis Wo Contrast  Result Date: 04/09/2017 CLINICAL DATA:  Lower abdominal pain, nausea/vomiting/diarrhea EXAM: CT ABDOMEN AND PELVIS WITHOUT CONTRAST TECHNIQUE: Multidetector CT imaging of the abdomen and pelvis was performed following the standard protocol  without IV contrast. COMPARISON:  11/14/2016 FINDINGS: Lower chest: Lung bases are clear. Cardiomegaly. Coronary atherosclerosis. Mitral valve annular calcifications. Hepatobiliary: Unenhanced liver is unremarkable. Status post cholecystectomy. No intrahepatic extrahepatic ductal dilatation. Pancreas: Within normal limits. Spleen: Within normal limits. Adrenals/Urinary Tract: Adrenal glands are within normal limits. Bilateral renal cortical atrophy. No renal calculi or hydronephrosis. Bladder is thick-walled. Associated hyperdense fluid in the bladder is presumably related to prior contrast administration (prior CT, cardiovascular, or interventional procedure) in a patient who is relatively anuric. Stomach/Bowel: Step stomach is within normal limits. No evidence of bowel obstruction. Appendix is not discretely visualized. Sigmoid diverticulosis, without evidence of diverticulitis. Vascular/Lymphatic: No evidence of abdominal aortic aneurysm. Atherosclerotic calcifications of the abdominal aorta and branch vessels. No suspicious abdominopelvic lymphadenopathy. Reproductive: Uterus is heterogeneous with small calcified uterine fibroids. No adnexal masses. Other: No abdominopelvic ascites. Musculoskeletal: Mild degenerative changes of the visualized thoracolumbar spine. Healed fracture of the right iliac wing. IMPRESSION: No evidence of bowel obstruction. Sigmoid diverticulosis, without evidence of diverticulitis. No CT findings to account for the patient's lower abdominal pain. Additional ancillary findings as above. Electronically Signed   By: Julian Hy M.D.   On: 03/18/2017 13:38   Dg Chest 2 View  Result Date: 03/30/2017 CLINICAL DATA:  Shortness of breath. EXAM: CHEST  2 VIEW COMPARISON:  Radiographs of March 08, 2016. FINDINGS: Stable cardiomegaly with mild central pulmonary vascular congestion. Atherosclerosis of thoracic aorta is noted. No pneumothorax is noted. Hypoinflation of the lungs is  noted with mild bibasilar subsegmental atelectasis. Bony thorax is unremarkable. IMPRESSION: Aortic atherosclerosis. Stable cardiomegaly with mild central pulmonary vascular congestion. Hypoinflation of the lungs with mild bibasilar subsegmental atelectasis. Electronically Signed   By: Marijo Conception, M.D.   On: 03/30/2017 07:37   Dg Chest Port 1 View  Result Date: 03/30/2017 CLINICAL DATA:  Post intubation. EXAM: PORTABLE CHEST 1 VIEW COMPARISON:  03/30/2017 FINDINGS: Endotracheal tube with tip measuring 4 cm above the carina. Shallow inspiration. Cardiac enlargement without vascular congestion. There is focal infiltration in the right lower lung. This could represent asymmetrical edema or pneumonia. No blunting of costophrenic angles. No pneumothorax. Calcification of the  aorta. Vascular stent in the left axilla. Degenerative changes in the spine. IMPRESSION: Endotracheal tube appears in appropriate position. Cardiac enlargement. Focal airspace disease in the right lower lung may indicate asymmetrical edema or pneumonia. Electronically Signed   By: Lucienne Capers M.D.   On: 03/30/2017 21:22   Dg Chest Port 1 View  Addendum Date: 03/30/2017   ADDENDUM REPORT: 03/30/2017 19:43 ADDENDUM: These results were called by telephone at the time of interpretation on 03/30/2017 at 7:33 pm to Dr. Salvadore Dom , who verbally acknowledged these results. Electronically Signed   By: Fidela Salisbury M.D.   On: 03/30/2017 19:43   Result Date: 03/30/2017 CLINICAL DATA:  Respiratory failure post intubation. EXAM: PORTABLE CHEST 1 VIEW COMPARISON:  03/30/2017 FINDINGS: Post intubation. Endotracheal tube terminates at the carina. Readjustment is recommended. Cardiomediastinal silhouette is enlarged. Prominent calcific atherosclerotic disease of the aorta. No evidence of pneumothorax. Worsening of the aeration of the lungs with bilateral patchy alveolar in interstitial opacities. Possible small subpulmonic effusions.  IMPRESSION: Endotracheal tube terminates at the carina. Retraction by 3 cm is recommended. Significant worsening of the appearance of the lungs with bilateral diffuse patchy airspace opacities which may represent mixed pattern edema or multifocal pneumonia. Electronically Signed: By: Fidela Salisbury M.D. On: 03/30/2017 19:26   US Abdomen Limited Ruq  Result Date: 03/30/2017 CLINICAL DATA:  Abdominal pain, history of prior cholecystectomy EXAM: ULTRASOUND ABDOMEN LIMITED RIGHT UPPER QUADRANT COMPARISON:  03/27/2017 FINDINGS: Gallbladder: Surgically removed Common bile duct: Diameter: 4.6 mm. Liver: No focal lesion identified. Within normal limits in parenchymal echogenicity. Portal vein is patent on color Doppler imaging with normal direction of blood flow towards the liver. IMPRESSION: Status post cholecystectomy.  No other focal abnormality is noted. Electronically Signed   By: Inez Catalina M.D.   On: 03/30/2017 16:32    Cardiac Studies   ECHO done yesterday results pending  Patient Profile     83 y.o. female  with a hx of diastolic congestive heart failure, non-obstructive coronary artery disease, diabetes, hypertension, hyperlipidemia, obstructive sleep apnea, end-stage renal disease on hemodialysis, peripheral arterial disease status post left BKA in 3/14, dementia admitted 04/03/2017 with a fib/flutter in setting of DKA and Sepsis. Started on insulin, dilt drip and metoprolol.  Code Blue at 7:43 pm lasting 18 min.  Pt became bradycardic and hypotensive-CPR.    Assessment & Plan    Persistent atrial fib CHADS2-VASc=7. -placed on IV heparin - pt had episode of chest pain prior to admit -  -IV dilt and BB stopped with brady and CODE blue.   -currently in a fib rate controlled in 70's to 80's  Code Blue- with brady, hypotension and agonal respirations.      Respiratory arrest now intubated on vent..  Per CCM  CAD with prior chest pain and troponin 0.33 plan had been for ischemic workup -  now  With arrest for comfort care only.  ESRD on HD T,TH,Sat - changing to comfort care stopping dialysis   Acute liver Shock with AST 5,459, ALT 2,285   For questions or updates, please contact [facility] Please consult [email or web address] for contact info under Cardiology/STEMI.      Signed, Cecilie Kicks, NP  04-24-17, 10:03 AM

## 2017-04-17 NOTE — Progress Notes (Signed)
RT Note:  ABG results from 21:46. Results did not cross over.  PH 7.22 CO2 30.7 Pao2 258 HCO3 12.7   RN & MD aware.

## 2017-04-17 NOTE — Progress Notes (Signed)
Inpatient Diabetes Program Recommendations  AACE/ADA: New Consensus Statement on Inpatient Glycemic Control (2015)  Target Ranges:  Prepandial:   less than 140 mg/dL      Peak postprandial:   less than 180 mg/dL (1-2 hours)      Critically ill patients:  140 - 180 mg/dL   Lab Results  Component Value Date   GLUCAP 319 (H) April 25, 2017   HGBA1C 9.2 (H) 2017/04/25    Review of Glycemic Control  Diabetes history: DM2 Outpatient Diabetes medications: Novolin 70/30 5 units QD (taken off insulin on 02/19/2017)Prandin 2 mg tidwc Current orders for Inpatient glycemic control: Novolog 0-9 units tidwc and hs  Inpatient Diabetes Program Recommendations:   Noted hypoglycemia with Lantus 15 units. Please consider: -Levemir 6 units daily  Thank you, Nani Gasser. Paolina Karwowski, RN, MSN, CDE  Diabetes Coordinator Inpatient Glycemic Control Team Team Pager [phone] (8am-5pm) Apr 25, 2017 10:38 AM

## 2017-04-17 NOTE — Progress Notes (Signed)
Morphine wasted down sink, 200 ml. Witnessed by Jake Bathe RN.

## 2017-04-17 NOTE — Progress Notes (Signed)
Chaplain met with family once patient passed away.  Allowed them to have time to share words about her and share their emotions.  Final prayer and blessing done with family.  Chaplain happy to serve this family.  They loved their nurse who cared for them today.  Thank you Shammy.    April 23, 2017 1910  Clinical Encounter Type  Visited With Family;Patient (patient deceased)  Visit Type Follow-up;Death  Referral From Nurse  Consult/Referral To [facility]

## 2017-04-17 NOTE — Progress Notes (Signed)
Continued follow up for patient and family.  Chaplain will continue to follow up.      04-24-2017 1552  Clinical Encounter Type  Visited With Patient;Family  Visit Type Follow-up;Spiritual support;Patient actively dying;Critical Care

## 2017-04-17 NOTE — Procedures (Signed)
Extubation Procedure Note  Patient Details:   Name: [name] DOB: [birth year] MRN: [id number]   Airway Documentation:     Evaluation  O2 sats: currently acceptable Complications: No apparent complications Patient did tolerate procedure well. Bilateral Breath Sounds: Diminished, Clear   No   Pt extubated per withdraw of life protocol to RA.   Elwin Mocha April 10, 2017, 1:40 PM

## 2017-04-17 NOTE — Plan of Care (Signed)
Problem: Nutrition: Goal: Adequate nutrition will be maintained Outcome: Progressing Patient understands the need for proper intake to enhance healing and promote good energy levels for participation in therapy. Will continue to promote good intake levels for better health and well being.

## 2017-04-17 NOTE — Progress Notes (Signed)
Nutrition Brief Note  Pt identified on the Malnutrition Screening Tool Report. Pt now transitioning to comfort care.  No nutrition interventions warranted at this time.  Please consult as needed.   Arthur Holms, RD, LDN Pager #: [phone] After-Hours Pager #: [phone]

## 2017-04-17 NOTE — Progress Notes (Signed)
Patient taken down to morgue, iv site d/c. Family member- daughter took all rings that were attached- wedding band/ diamond, blue color stone ring, birthstone ring and bracelet taken by daughter.

## 2017-04-17 NOTE — Progress Notes (Signed)
Patient placed on comfort care. All drips stopped. Fentanyl waste was 114ml, witnessed by Jake Bathe RN. Wasted down sink.

## 2017-04-17 NOTE — Progress Notes (Signed)
Per families request and in accordance with the withdraw of life protocol the patient has been terminally extubated. Family is at bedside.

## 2017-04-17 NOTE — Progress Notes (Signed)
Patient time of death 41, family at bedside. Dr. Titus Mould notified as well as [place] donor services. Post mortem check list completed.

## 2017-04-17 DEATH — deceased

## 2017-05-14 ENCOUNTER — Ambulatory Visit: Payer: Medicare Other | Admitting: Vascular Surgery

## 2017-05-14 ENCOUNTER — Encounter (HOSPITAL_COMMUNITY): Payer: Medicare HMO

## 2017-06-20 ENCOUNTER — Ambulatory Visit: Payer: Medicare HMO | Admitting: Endocrinology

## 2018-01-08 IMAGING — CR DG SHOULDER 2+V*L*
3 series · 3 of 3 positions shown · non-contrast
Comparison: None.

CLINICAL DATA: Generalized left shoulder pain for few days.

EXAM:
LEFT SHOULDER - 2+ VIEW

[w shoulder ap internal left]
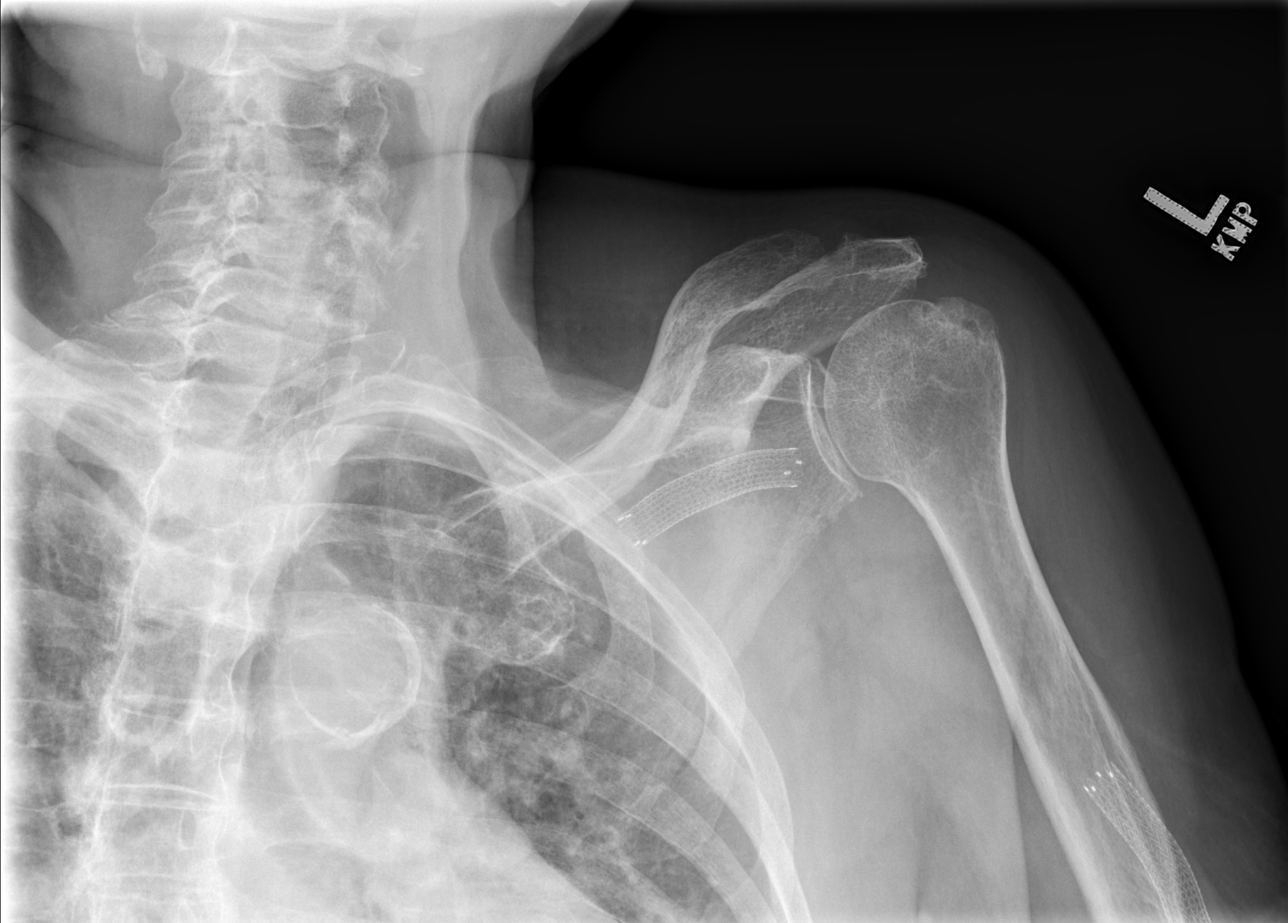

[w shoulder y view left]
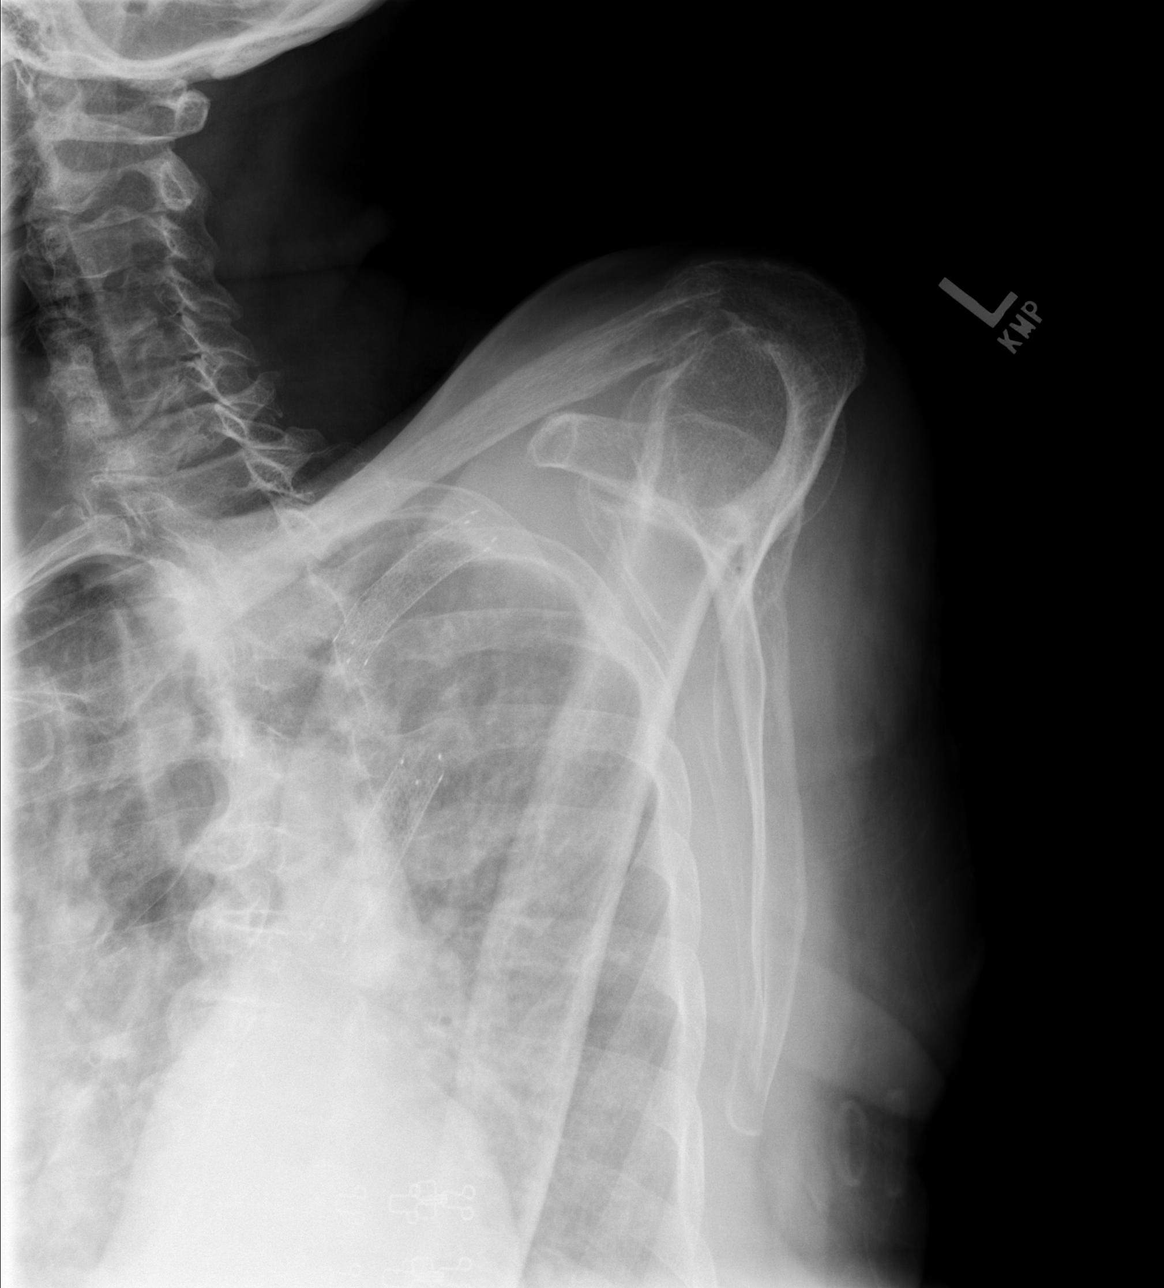

[w shoulder axillary left *]
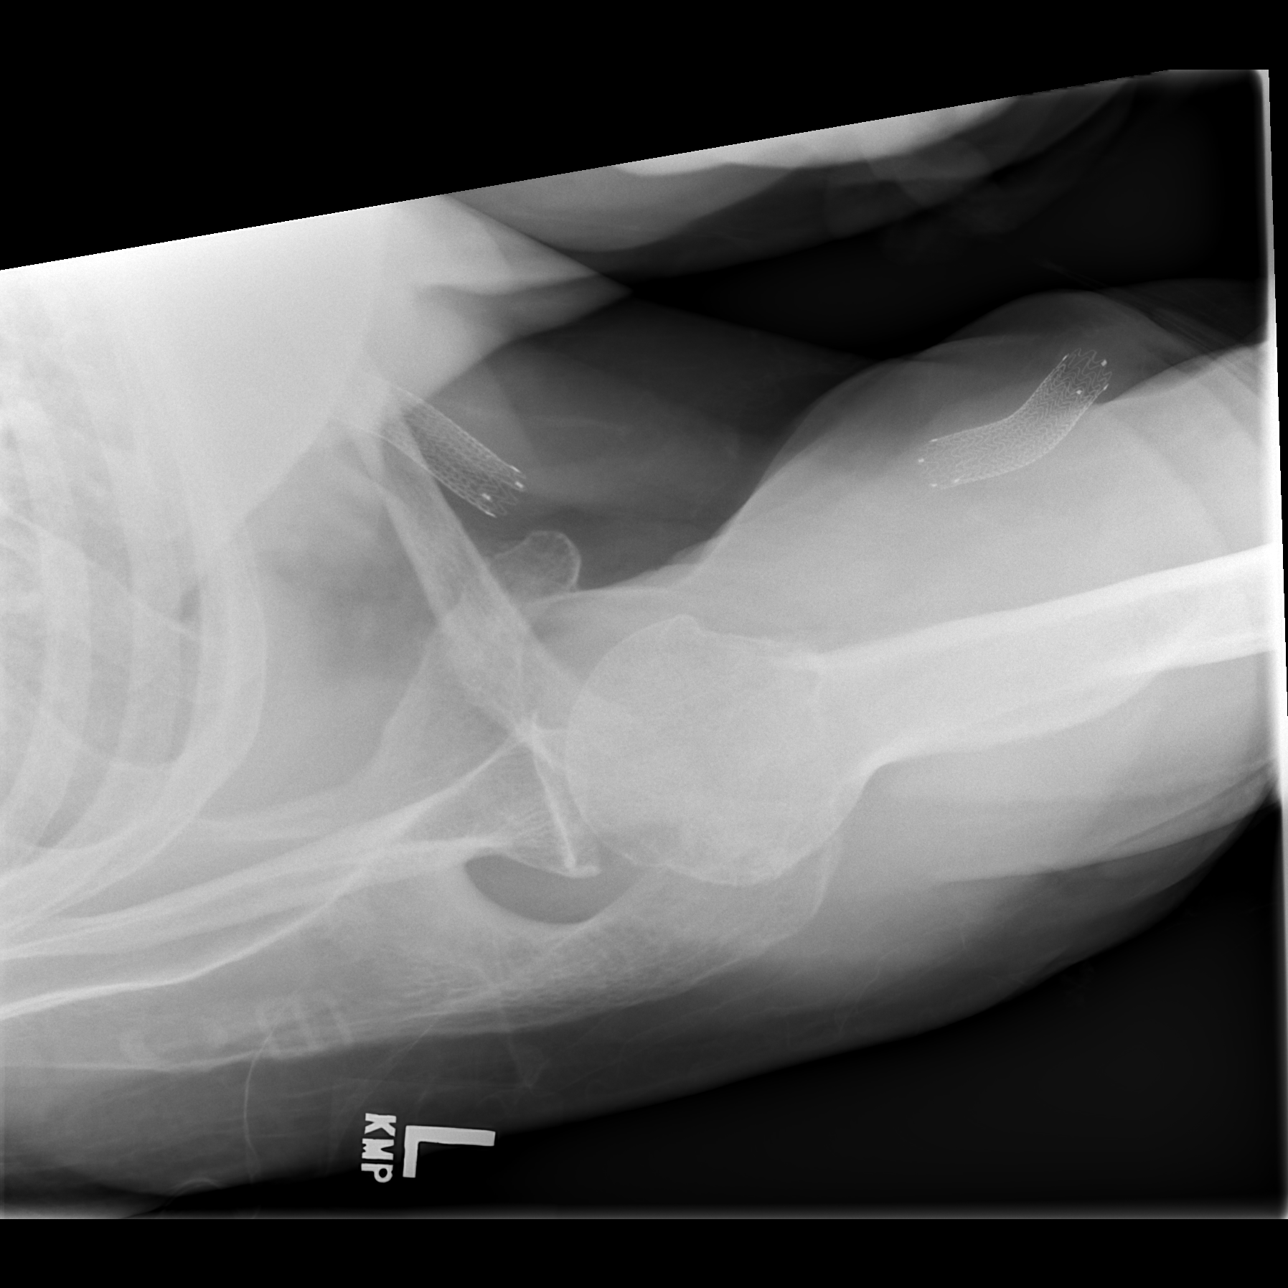

[3 of 3 positions shown; findings below may reference images not displayed]

FINDINGS: There is no evidence of fracture or dislocation. Mild osteoarthritic
changes of the glenohumeral joint. Vascular stent noted.
Atherosclerotic calcifications of the visualized aortic arch.
IMPRESSION: Mild osteoarthritic changes of the glenohumeral joint.
